# Patient Record
Sex: Female | Born: 1950 | Race: White | Hispanic: No | Marital: Single | Smoking: Former smoker
Health system: Southern US, Community
[De-identification: ages and names within clinical notes are randomized; demographics above are authoritative.]

## PROBLEM LIST (undated history)

## (undated) DIAGNOSIS — B182 Chronic viral hepatitis C: Secondary | ICD-10-CM

## (undated) DIAGNOSIS — I10 Essential (primary) hypertension: Secondary | ICD-10-CM

## (undated) DIAGNOSIS — Z9889 Other specified postprocedural states: Secondary | ICD-10-CM

## (undated) DIAGNOSIS — J15212 Pneumonia due to Methicillin resistant Staphylococcus aureus: Secondary | ICD-10-CM

## (undated) DIAGNOSIS — J95851 Ventilator associated pneumonia: Secondary | ICD-10-CM

## (undated) DIAGNOSIS — M1990 Unspecified osteoarthritis, unspecified site: Secondary | ICD-10-CM

## (undated) DIAGNOSIS — G2581 Restless legs syndrome: Secondary | ICD-10-CM

## (undated) DIAGNOSIS — M810 Age-related osteoporosis without current pathological fracture: Secondary | ICD-10-CM

## (undated) DIAGNOSIS — I4891 Unspecified atrial fibrillation: Secondary | ICD-10-CM

## (undated) DIAGNOSIS — R0600 Dyspnea, unspecified: Secondary | ICD-10-CM

## (undated) DIAGNOSIS — A0472 Enterocolitis due to Clostridium difficile, not specified as recurrent: Secondary | ICD-10-CM

## (undated) DIAGNOSIS — J449 Chronic obstructive pulmonary disease, unspecified: Secondary | ICD-10-CM

## (undated) DIAGNOSIS — F419 Anxiety disorder, unspecified: Secondary | ICD-10-CM

## (undated) DIAGNOSIS — G629 Polyneuropathy, unspecified: Secondary | ICD-10-CM

## (undated) DIAGNOSIS — I499 Cardiac arrhythmia, unspecified: Secondary | ICD-10-CM

## (undated) DIAGNOSIS — F329 Major depressive disorder, single episode, unspecified: Secondary | ICD-10-CM

## (undated) DIAGNOSIS — M797 Fibromyalgia: Secondary | ICD-10-CM

## (undated) DIAGNOSIS — Z9981 Dependence on supplemental oxygen: Secondary | ICD-10-CM

## (undated) DIAGNOSIS — Z931 Gastrostomy status: Secondary | ICD-10-CM

## (undated) DIAGNOSIS — F32A Depression, unspecified: Secondary | ICD-10-CM

## (undated) DIAGNOSIS — D649 Anemia, unspecified: Secondary | ICD-10-CM

## (undated) HISTORY — PX: BREAST SURGERY: SHX581

## (undated) HISTORY — PX: ROTATOR CUFF REPAIR: SHX139

## (undated) HISTORY — PX: DILATION AND CURETTAGE OF UTERUS: SHX78

## (undated) HISTORY — PX: ABDOMINAL HYSTERECTOMY: SHX81

---

## 2015-06-25 DIAGNOSIS — J15212 Pneumonia due to Methicillin resistant Staphylococcus aureus: Secondary | ICD-10-CM

## 2015-06-25 HISTORY — DX: Pneumonia due to methicillin resistant Staphylococcus aureus: J15.212

## 2015-09-23 DIAGNOSIS — A0472 Enterocolitis due to Clostridium difficile, not specified as recurrent: Secondary | ICD-10-CM

## 2015-09-23 DIAGNOSIS — Z931 Gastrostomy status: Secondary | ICD-10-CM

## 2015-09-23 HISTORY — DX: Enterocolitis due to Clostridium difficile, not specified as recurrent: A04.72

## 2015-09-23 HISTORY — DX: Gastrostomy status: Z93.1

## 2015-10-23 DIAGNOSIS — J95851 Ventilator associated pneumonia: Secondary | ICD-10-CM

## 2015-10-23 HISTORY — DX: Ventilator associated pneumonia: J95.851

## 2015-11-29 DIAGNOSIS — J440 Chronic obstructive pulmonary disease with acute lower respiratory infection: Secondary | ICD-10-CM | POA: Insufficient documentation

## 2015-12-06 DIAGNOSIS — J9611 Chronic respiratory failure with hypoxia: Secondary | ICD-10-CM | POA: Insufficient documentation

## 2015-12-06 DIAGNOSIS — F32A Depression, unspecified: Secondary | ICD-10-CM | POA: Insufficient documentation

## 2015-12-06 DIAGNOSIS — Z9889 Other specified postprocedural states: Secondary | ICD-10-CM | POA: Insufficient documentation

## 2015-12-06 DIAGNOSIS — G2581 Restless legs syndrome: Secondary | ICD-10-CM | POA: Insufficient documentation

## 2015-12-06 DIAGNOSIS — F1911 Other psychoactive substance abuse, in remission: Secondary | ICD-10-CM | POA: Insufficient documentation

## 2015-12-06 DIAGNOSIS — Z931 Gastrostomy status: Secondary | ICD-10-CM | POA: Insufficient documentation

## 2015-12-06 DIAGNOSIS — M797 Fibromyalgia: Secondary | ICD-10-CM | POA: Insufficient documentation

## 2015-12-06 DIAGNOSIS — I10 Essential (primary) hypertension: Secondary | ICD-10-CM | POA: Insufficient documentation

## 2015-12-06 DIAGNOSIS — M816 Localized osteoporosis [Lequesne]: Secondary | ICD-10-CM | POA: Insufficient documentation

## 2015-12-06 DIAGNOSIS — B182 Chronic viral hepatitis C: Secondary | ICD-10-CM | POA: Insufficient documentation

## 2015-12-06 DIAGNOSIS — G609 Hereditary and idiopathic neuropathy, unspecified: Secondary | ICD-10-CM | POA: Insufficient documentation

## 2016-01-31 ENCOUNTER — Other Ambulatory Visit: Payer: Self-pay | Admitting: Student

## 2016-01-31 DIAGNOSIS — G8929 Other chronic pain: Secondary | ICD-10-CM

## 2016-01-31 DIAGNOSIS — M7582 Other shoulder lesions, left shoulder: Secondary | ICD-10-CM

## 2016-01-31 DIAGNOSIS — M25512 Pain in left shoulder: Principal | ICD-10-CM

## 2016-02-15 ENCOUNTER — Ambulatory Visit: Admission: RE | Admit: 2016-02-15 | Payer: Medicare Other | Source: Ambulatory Visit

## 2016-02-27 ENCOUNTER — Ambulatory Visit
Admission: RE | Admit: 2016-02-27 | Discharge: 2016-02-27 | Disposition: A | Discharge status: Home / Self Care | Payer: Medicare Other | Source: Ambulatory Visit | Attending: Student | Admitting: Student

## 2016-02-27 DIAGNOSIS — M7552 Bursitis of left shoulder: Secondary | ICD-10-CM | POA: Diagnosis not present

## 2016-02-27 DIAGNOSIS — M12812 Other specific arthropathies, not elsewhere classified, left shoulder: Secondary | ICD-10-CM | POA: Diagnosis not present

## 2016-02-27 DIAGNOSIS — M7582 Other shoulder lesions, left shoulder: Secondary | ICD-10-CM | POA: Insufficient documentation

## 2016-02-27 DIAGNOSIS — M25412 Effusion, left shoulder: Secondary | ICD-10-CM | POA: Diagnosis not present

## 2016-02-27 DIAGNOSIS — M62512 Muscle wasting and atrophy, not elsewhere classified, left shoulder: Secondary | ICD-10-CM | POA: Diagnosis not present

## 2016-02-27 DIAGNOSIS — M25512 Pain in left shoulder: Secondary | ICD-10-CM | POA: Diagnosis present

## 2016-02-27 DIAGNOSIS — G8929 Other chronic pain: Secondary | ICD-10-CM | POA: Diagnosis not present

## 2016-11-09 ENCOUNTER — Inpatient Hospital Stay
Admission: EM | Admit: 2016-11-09 | Discharge: 2016-11-10 | DRG: 309 | Disposition: A | Discharge status: Home / Self Care | Payer: Medicare Other | Attending: Internal Medicine | Admitting: Internal Medicine

## 2016-11-09 ENCOUNTER — Emergency Department: Payer: Medicare Other

## 2016-11-09 DIAGNOSIS — F329 Major depressive disorder, single episode, unspecified: Secondary | ICD-10-CM | POA: Diagnosis present

## 2016-11-09 DIAGNOSIS — I4891 Unspecified atrial fibrillation: Secondary | ICD-10-CM | POA: Diagnosis present

## 2016-11-09 DIAGNOSIS — Z931 Gastrostomy status: Secondary | ICD-10-CM | POA: Diagnosis not present

## 2016-11-09 DIAGNOSIS — M810 Age-related osteoporosis without current pathological fracture: Secondary | ICD-10-CM | POA: Diagnosis present

## 2016-11-09 DIAGNOSIS — G629 Polyneuropathy, unspecified: Secondary | ICD-10-CM | POA: Diagnosis present

## 2016-11-09 DIAGNOSIS — B182 Chronic viral hepatitis C: Secondary | ICD-10-CM | POA: Diagnosis present

## 2016-11-09 DIAGNOSIS — Z9981 Dependence on supplemental oxygen: Secondary | ICD-10-CM

## 2016-11-09 DIAGNOSIS — F419 Anxiety disorder, unspecified: Secondary | ICD-10-CM | POA: Diagnosis present

## 2016-11-09 DIAGNOSIS — G2581 Restless legs syndrome: Secondary | ICD-10-CM | POA: Diagnosis present

## 2016-11-09 DIAGNOSIS — Z791 Long term (current) use of non-steroidal anti-inflammatories (NSAID): Secondary | ICD-10-CM

## 2016-11-09 DIAGNOSIS — J9610 Chronic respiratory failure, unspecified whether with hypoxia or hypercapnia: Secondary | ICD-10-CM | POA: Diagnosis present

## 2016-11-09 DIAGNOSIS — E871 Hypo-osmolality and hyponatremia: Secondary | ICD-10-CM | POA: Diagnosis present

## 2016-11-09 DIAGNOSIS — I10 Essential (primary) hypertension: Secondary | ICD-10-CM | POA: Diagnosis present

## 2016-11-09 DIAGNOSIS — M797 Fibromyalgia: Secondary | ICD-10-CM | POA: Diagnosis present

## 2016-11-09 DIAGNOSIS — I471 Supraventricular tachycardia: Secondary | ICD-10-CM | POA: Diagnosis present

## 2016-11-09 DIAGNOSIS — J449 Chronic obstructive pulmonary disease, unspecified: Secondary | ICD-10-CM | POA: Diagnosis present

## 2016-11-09 DIAGNOSIS — Z7951 Long term (current) use of inhaled steroids: Secondary | ICD-10-CM

## 2016-11-09 DIAGNOSIS — Z7982 Long term (current) use of aspirin: Secondary | ICD-10-CM | POA: Diagnosis not present

## 2016-11-09 DIAGNOSIS — I272 Pulmonary hypertension, unspecified: Secondary | ICD-10-CM | POA: Diagnosis present

## 2016-11-09 DIAGNOSIS — I480 Paroxysmal atrial fibrillation: Secondary | ICD-10-CM | POA: Diagnosis present

## 2016-11-09 DIAGNOSIS — Z79899 Other long term (current) drug therapy: Secondary | ICD-10-CM

## 2016-11-09 DIAGNOSIS — Z87891 Personal history of nicotine dependence: Secondary | ICD-10-CM

## 2016-11-09 HISTORY — DX: Chronic obstructive pulmonary disease, unspecified: J44.9

## 2016-11-09 HISTORY — DX: Essential (primary) hypertension: I10

## 2016-11-09 HISTORY — DX: Age-related osteoporosis without current pathological fracture: M81.0

## 2016-11-09 HISTORY — DX: Fibromyalgia: M79.7

## 2016-11-09 HISTORY — DX: Major depressive disorder, single episode, unspecified: F32.9

## 2016-11-09 HISTORY — DX: Restless legs syndrome: G25.81

## 2016-11-09 HISTORY — DX: Enterocolitis due to Clostridium difficile, not specified as recurrent: A04.72

## 2016-11-09 HISTORY — DX: Pneumonia due to methicillin resistant Staphylococcus aureus: J15.212

## 2016-11-09 HISTORY — DX: Gastrostomy status: Z93.1

## 2016-11-09 HISTORY — DX: Polyneuropathy, unspecified: G62.9

## 2016-11-09 HISTORY — DX: Chronic viral hepatitis C: B18.2

## 2016-11-09 HISTORY — DX: Other specified postprocedural states: Z98.890

## 2016-11-09 HISTORY — DX: Depression, unspecified: F32.A

## 2016-11-09 LAB — PHOSPHORUS: PHOSPHORUS: 3.8 mg/dL (ref 2.5–4.6)

## 2016-11-09 LAB — URINALYSIS, COMPLETE (UACMP) WITH MICROSCOPIC
Bilirubin Urine: NEGATIVE
GLUCOSE, UA: NEGATIVE mg/dL
HGB URINE DIPSTICK: NEGATIVE
Ketones, ur: NEGATIVE mg/dL
Leukocytes, UA: NEGATIVE
Nitrite: NEGATIVE
PROTEIN: NEGATIVE mg/dL
SPECIFIC GRAVITY, URINE: 1.003 — AB (ref 1.005–1.030)
pH: 7 (ref 5.0–8.0)

## 2016-11-09 LAB — TSH
TSH: 2.032 u[IU]/mL (ref 0.350–4.500)
TSH: 1.34 u[IU]/mL (ref 0.350–4.500)

## 2016-11-09 LAB — COMPREHENSIVE METABOLIC PANEL
ALT: 19 U/L (ref 14–54)
ANION GAP: 9 (ref 5–15)
AST: 27 U/L (ref 15–41)
Albumin: 4.1 g/dL (ref 3.5–5.0)
Alkaline Phosphatase: 18 U/L — ABNORMAL LOW (ref 38–126)
BILIRUBIN TOTAL: 0.5 mg/dL (ref 0.3–1.2)
BUN: 17 mg/dL (ref 6–20)
CO2: 28 mmol/L (ref 22–32)
Calcium: 9.2 mg/dL (ref 8.9–10.3)
Chloride: 90 mmol/L — ABNORMAL LOW (ref 101–111)
Creatinine, Ser: 0.86 mg/dL (ref 0.44–1.00)
GFR calc Af Amer: >60 mL/min (ref >60)
Glucose, Bld: 101 mg/dL — ABNORMAL HIGH (ref 65–99)
POTASSIUM: 3.8 mmol/L (ref 3.5–5.1)
Sodium: 127 mmol/L — ABNORMAL LOW (ref 135–145)
TOTAL PROTEIN: 7.2 g/dL (ref 6.5–8.1)

## 2016-11-09 LAB — TROPONIN I: Troponin I: <0.03 ng/mL (ref <0.03)

## 2016-11-09 LAB — CBC
HEMATOCRIT: 34.6 % — AB (ref 35.0–47.0)
HEMOGLOBIN: 11.7 g/dL — AB (ref 12.0–16.0)
MCH: 31.4 pg (ref 26.0–34.0)
MCHC: 33.7 g/dL (ref 32.0–36.0)
MCV: 93 fL (ref 80.0–100.0)
Platelets: 324 10*3/uL (ref 150–440)
RBC: 3.72 MIL/uL — ABNORMAL LOW (ref 3.80–5.20)
RDW: 13.6 % (ref 11.5–14.5)
WBC: 8 10*3/uL (ref 3.6–11.0)

## 2016-11-09 LAB — T4, FREE: Free T4: 0.75 ng/dL (ref 0.61–1.12)

## 2016-11-09 LAB — MAGNESIUM: MAGNESIUM: 1.9 mg/dL (ref 1.7–2.4)

## 2016-11-09 MED ORDER — UMECLIDINIUM BROMIDE 62.5 MCG/INH IN AEPB
1.0000 | INHALATION_SPRAY | Freq: Every day | RESPIRATORY_TRACT | Status: DC
  Administered 2016-11-10: 10:00:00 1 via RESPIRATORY_TRACT
  Filled 2016-11-09: qty 7

## 2016-11-09 MED ORDER — ESTRADIOL 1 MG PO TABS
1.0000 mg | ORAL_TABLET | Freq: Every day | ORAL | Status: DC
  Administered 2016-11-10: 1 mg via ORAL
  Filled 2016-11-09: qty 1

## 2016-11-09 MED ORDER — DILTIAZEM HCL 100 MG IV SOLR
5.0000 mg/h | INTRAVENOUS | Status: DC
  Administered 2016-11-09: 5 mg/h via INTRAVENOUS
  Filled 2016-11-09 (×2): qty 100

## 2016-11-09 MED ORDER — ONDANSETRON HCL 4 MG PO TABS: 4.0000 mg | ORAL_TABLET | Freq: Four times a day (QID) | ORAL | Status: DC | PRN

## 2016-11-09 MED ORDER — ACETAMINOPHEN 325 MG PO TABS: 650.0000 mg | ORAL_TABLET | Freq: Four times a day (QID) | ORAL | Status: DC | PRN

## 2016-11-09 MED ORDER — MAGNESIUM OXIDE 400 (241.3 MG) MG PO TABS
400.0000 mg | ORAL_TABLET | Freq: Every day | ORAL | Status: DC
  Administered 2016-11-10: 400 mg via ORAL
  Filled 2016-11-09: qty 1

## 2016-11-09 MED ORDER — FUROSEMIDE 20 MG PO TABS
20.0000 mg | ORAL_TABLET | Freq: Every day | ORAL | Status: DC
  Administered 2016-11-10: 20 mg via ORAL
  Filled 2016-11-09: qty 1

## 2016-11-09 MED ORDER — SODIUM CHLORIDE 0.9 % IV BOLUS (SEPSIS)
1000.0000 mL | Freq: Once | INTRAVENOUS | Status: AC
  Administered 2016-11-09: 1000 mL via INTRAVENOUS

## 2016-11-09 MED ORDER — LISINOPRIL 10 MG PO TABS
10.0000 mg | ORAL_TABLET | Freq: Every day | ORAL | Status: DC
  Administered 2016-11-10: 10 mg via ORAL
  Filled 2016-11-09: qty 1

## 2016-11-09 MED ORDER — FLUTICASONE FUROATE-VILANTEROL 100-25 MCG/INH IN AEPB
1.0000 | INHALATION_SPRAY | Freq: Every day | RESPIRATORY_TRACT | Status: DC
  Administered 2016-11-10: 1 via RESPIRATORY_TRACT
  Filled 2016-11-09: qty 28

## 2016-11-09 MED ORDER — ASPIRIN 325 MG PO TABS
325.0000 mg | ORAL_TABLET | Freq: Every day | ORAL | Status: DC
  Administered 2016-11-10: 325 mg via ORAL
  Filled 2016-11-09: qty 1

## 2016-11-09 MED ORDER — ENOXAPARIN SODIUM 60 MG/0.6ML ~~LOC~~ SOLN
1.0000 mg/kg | SUBCUTANEOUS | Status: AC
  Administered 2016-11-09: 50 mg via SUBCUTANEOUS
  Filled 2016-11-09: qty 0.6

## 2016-11-09 MED ORDER — MELOXICAM 7.5 MG PO TABS
7.5000 mg | ORAL_TABLET | Freq: Every day | ORAL | Status: DC
  Administered 2016-11-10: 7.5 mg via ORAL
  Filled 2016-11-09: qty 1

## 2016-11-09 MED ORDER — ONDANSETRON HCL 4 MG/2ML IJ SOLN: 4.0000 mg | Freq: Four times a day (QID) | INTRAMUSCULAR | Status: DC | PRN

## 2016-11-09 MED ORDER — ENOXAPARIN SODIUM 60 MG/0.6ML ~~LOC~~ SOLN
1.0000 mg/kg | Freq: Two times a day (BID) | SUBCUTANEOUS | Status: DC
  Administered 2016-11-10: 50 mg via SUBCUTANEOUS
  Filled 2016-11-09: qty 0.6

## 2016-11-09 MED ORDER — DILTIAZEM LOAD VIA INFUSION
20.0000 mg | Freq: Once | INTRAVENOUS | Status: AC
  Administered 2016-11-09: 20 mg via INTRAVENOUS
  Filled 2016-11-09: qty 20

## 2016-11-09 MED ORDER — ADENOSINE 12 MG/4ML IV SOLN
INTRAVENOUS | Status: AC
  Filled 2016-11-09: qty 4

## 2016-11-09 MED ORDER — ACETAMINOPHEN 650 MG RE SUPP: 650.0000 mg | Freq: Four times a day (QID) | RECTAL | Status: DC | PRN

## 2016-11-09 MED ORDER — MAGNESIUM SULFATE 2 GM/50ML IV SOLN
2.0000 g | Freq: Once | INTRAVENOUS | Status: AC
  Administered 2016-11-09: 2 g via INTRAVENOUS
  Filled 2016-11-09: qty 50

## 2016-11-09 MED ORDER — ADENOSINE 6 MG/2ML IV SOLN
INTRAVENOUS | Status: DC | PRN
  Administered 2016-11-09: 12 mg via INTRAVENOUS

## 2016-11-09 MED ORDER — QUETIAPINE FUMARATE 25 MG PO TABS
25.0000 mg | ORAL_TABLET | Freq: Every day | ORAL | Status: DC
Start: 2016-11-09 — End: 2016-11-10
  Administered 2016-11-09: 25 mg via ORAL
  Filled 2016-11-09: qty 1

## 2016-11-09 MED ORDER — HYDROCODONE-ACETAMINOPHEN 10-325 MG PO TABS
1.0000 | ORAL_TABLET | Freq: Four times a day (QID) | ORAL | Status: DC | PRN
  Administered 2016-11-10: 1 via ORAL
  Filled 2016-11-09: qty 1

## 2016-11-09 MED ORDER — DIAZEPAM 5 MG PO TABS
5.0000 mg | ORAL_TABLET | Freq: Three times a day (TID) | ORAL | Status: DC | PRN
  Administered 2016-11-09: 5 mg via ORAL
  Filled 2016-11-09: qty 1

## 2016-11-09 MED ORDER — ALBUTEROL SULFATE (2.5 MG/3ML) 0.083% IN NEBU: 3.0000 mL | INHALATION_SOLUTION | Freq: Four times a day (QID) | RESPIRATORY_TRACT | Status: DC | PRN

## 2016-11-09 MED ORDER — METOPROLOL TARTRATE 25 MG PO TABS
25.0000 mg | ORAL_TABLET | Freq: Two times a day (BID) | ORAL | Status: DC
  Administered 2016-11-09 – 2016-11-10 (×2): 25 mg via ORAL
  Filled 2016-11-09 (×3): qty 1

## 2016-11-09 NOTE — H&P (Signed)
[facility] -  at [facility]   PATIENT NAME: Virginia CraftSandra [name]    MR#:  [id number]  DATE OF BIRTH:  03/18/1951  DATE OF ADMISSION:  11/09/2016  PRIMARY CARE PHYSICIAN: Marguarite ArbourSparks, Jeffrey D, MD   REQUESTING/REFERRING PHYSICIAN: Sharman CheekStafford Phillip MD  CHIEF COMPLAINT:   Chief Complaint  Patient presents with  . Chest Pain  . Shortness of Breath    HISTORY OF PRESENT ILLNESS: Virginia CraftSandra [name]  is a 66 y.o. female with a known history of  Chronic respiratory failure, copd , depression, fibromyalgia, hepatitis C , essential hypertension, osteoporosis, peripheral neuropathy, restless leg syndrome who presents to the ED with complaint of having chest pain and palpitations. Patient reports that she's been having these symptoms ongoing for the past 1 week. Her palpitations lasted only briefly for the past week however today they continue to persist throughout the day. Patient was noted to be in atrial fibrillation by EMS and had episode of SVT for which she received 6 mg of adenosine. Heart rate continued to be elevated therefore had to be started on a Cardizem drip. Her heart rate is currently controlled. She is on chronic oxygen breathing seems to be at baseline.   PAST MEDICAL HISTORY:   Past Medical History:  Diagnosis Date  . C. difficile colitis   . COPD (chronic obstructive pulmonary disease) (HCC)   . Depression   . Fibromyalgia   . H/O tracheostomy   . Hep C w/ coma, chronic (HCC)   . Hypertension   . MRSA pneumonia (HCC)   . Osteoporosis   . Peripheral neuropathy   . RLS (restless legs syndrome)   . S/P percutaneous endoscopic gastrostomy (PEG) tube placement (HCC)     PAST SURGICAL HISTORY: Past Surgical History:  Procedure Laterality Date  . ABDOMINAL HYSTERECTOMY    . ROTATOR CUFF REPAIR      SOCIAL HISTORY:  Social History  Substance Use Topics  . Smoking status: Former Games developermoker  . Smokeless tobacco: Never Used  . Alcohol use No    FAMILY HISTORY:   Family History  Problem Relation Age of Onset  . Hypertension Mother     DRUG ALLERGIES: Not on File  REVIEW OF SYSTEMS:   CONSTITUTIONAL: No fever, fatigue or weakness.  EYES: No blurred or double vision.  EARS, NOSE, AND THROAT: No tinnitus or ear pain.  RESPIRATORY: No cough, shortness of breath, wheezing or hemoptysis.  CARDIOVASCULAR: No chest pain, orthopnea, edema. Positive palpitations GASTROINTESTINAL: No nausea, vomiting, diarrhea or abdominal pain.  GENITOURINARY: No dysuria, hematuria.  ENDOCRINE: No polyuria, nocturia,  HEMATOLOGY: No anemia, easy bruising or bleeding SKIN: No rash or lesion. MUSCULOSKELETAL: No joint pain or arthritis.   NEUROLOGIC: No tingling, numbness, weakness.  PSYCHIATRY: No anxiety or depression.   MEDICATIONS AT HOME:  Prior to Admission medications   Medication Sig Start Date End Date Taking? Authorizing Provider  aspirin 325 MG tablet Take 325 mg by mouth every 4 (four) hours as needed.   Yes [provider]  diazepam (VALIUM) 5 MG tablet Take 5 mg by mouth every 8 (eight) hours as needed. 10/22/16  Yes [provider]  estradiol (ESTRACE) 1 MG tablet Take 1 mg by mouth daily. 09/10/16  Yes [provider]  furosemide (LASIX) 20 MG tablet Take 20 mg by mouth daily.  10/01/16  Yes [provider]  HYDROcodone-acetaminophen (NORCO) 10-325 MG tablet Take 1 tablet by mouth every 6 (six) hours as needed. 10/23/16  Yes [provider]  INCRUSE ELLIPTA 62.5 MCG/INH AEPB Inhale 1 puff into the lungs daily. 10/24/16  Yes [provider]  lisinopril (PRINIVIL,ZESTRIL) 10 MG tablet Take 10 mg by mouth daily. 10/23/16  Yes [provider]  MAGNESIUM-OXIDE 400 (241.3 Mg) MG tablet Take 1 tablet by mouth daily. 08/24/16  Yes [provider]  meloxicam (MOBIC) 7.5 MG tablet Take 7.5 mg by mouth daily. 10/01/16  Yes [provider]  metoprolol tartrate (LOPRESSOR) 25 MG tablet Take 25 mg  by mouth 2 (two) times daily. 09/20/16  Yes [provider]  Multiple Vitamin (MULTIVITAMIN) tablet Take 1 tablet by mouth daily.   Yes [provider]  Multiple Vitamins-Minerals (ZINC PO) Take 1 tablet by mouth daily.   Yes [provider]  PROAIR HFA 108 (90 Base) MCG/ACT inhaler Inhale 2 puffs into the lungs every 6 (six) hours as needed. 10/23/16  Yes [provider]  QUEtiapine (SEROQUEL) 25 MG tablet Take 25 mg by mouth daily. 10/01/16  Yes [provider]  SYMBICORT 80-4.5 MCG/ACT inhaler Inhale 2 puffs into the lungs 2 (two) times daily. 10/24/16  Yes [provider]      PHYSICAL EXAMINATION:   VITAL SIGNS: Blood pressure (!) 131/102, pulse 99, resp. rate 17, weight 107 lb (48.5 kg), SpO2 99 %.  GENERAL:  66 y.o.-year-old patient lying in the bed with no acute distress.  EYES: Pupils equal, round, reactive to light and accommodation. No scleral icterus. Extraocular muscles intact.  HEENT: Head atraumatic, normocephalic. Oropharynx and nasopharynx clear.  NECK:  Supple, no jugular venous distention. No thyroid enlargement, no tenderness.  LUNGS: Normal breath sounds bilaterally, no wheezing, rales,rhonchi or crepitation. No use of accessory muscles of respiration.  CARDIOVASCULAR: Irregularly irregular. No murmurs, rubs, or gallops.  ABDOMEN: Soft, nontender, nondistended. Bowel sounds present. No organomegaly or mass.  EXTREMITIES: No pedal edema, cyanosis, or clubbing.  NEUROLOGIC: Cranial nerves II through XII are intact. Muscle strength 5/5 in all extremities. Sensation intact. Gait not checked.  PSYCHIATRIC: The patient is alert and oriented x 3.  SKIN: No obvious rash, lesion, or ulcer.   LABORATORY PANEL:   CBC  Recent Labs Lab 11/09/16 1854  WBC 8.0  HGB 11.7*  HCT 34.6*  PLT 324  MCV 93.0  MCH 31.4  MCHC 33.7  RDW 13.6    ------------------------------------------------------------------------------------------------------------------  Chemistries   Recent Labs Lab 11/09/16 1854  NA 127*  K 3.8  CL 90*  CO2 28  GLUCOSE 101*  BUN 17  CREATININE 0.86  CALCIUM 9.2  MG 1.9  AST 27  ALT 19  ALKPHOS 18*  BILITOT 0.5   ------------------------------------------------------------------------------------------------------------------ CrCl cannot be calculated (Unknown ideal weight.). ------------------------------------------------------------------------------------------------------------------  Recent Labs  11/09/16 1854  TSH 2.032     Coagulation profile No results for input(s): INR, PROTIME in the last 168 hours. ------------------------------------------------------------------------------------------------------------------- No results for input(s): DDIMER in the last 72 hours. -------------------------------------------------------------------------------------------------------------------  Cardiac Enzymes  Recent Labs Lab 11/09/16 1854  TROPONINI <0.03   ------------------------------------------------------------------------------------------------------------------ Invalid input(s): POCBNP  ---------------------------------------------------------------------------------------------------------------  Urinalysis    Component Value Date/Time   COLORURINE STRAW (A) 11/09/2016 1855   APPEARANCEUR CLEAR (A) 11/09/2016 1855   LABSPEC 1.003 (L) 11/09/2016 1855   PHURINE 7.0 11/09/2016 1855   GLUCOSEU NEGATIVE 11/09/2016 1855   HGBUR NEGATIVE 11/09/2016 1855   BILIRUBINUR NEGATIVE 11/09/2016 1855   KETONESUR NEGATIVE 11/09/2016 1855   PROTEINUR NEGATIVE 11/09/2016 1855   NITRITE NEGATIVE 11/09/2016 1855   LEUKOCYTESUR NEGATIVE 11/09/2016 1855     RADIOLOGY:  Dg Chest Portable 1 View  Result Date: 11/09/2016 CLINICAL DATA:  Chest pain, dizziness beginning this  evening. History of atrial fibrillation. EXAM: PORTABLE CHEST 1 VIEW COMPARISON:  None. FINDINGS: Lungs are adequately inflated without focal consolidation or effusion. Cardiomediastinal silhouette is within normal. There is mild calcified plaque over the thoracic aorta. Mild degenerate change of the spine. IMPRESSION: No acute cardiopulmonary disease. Aortic atherosclerosis. Electronically Signed   By: Elberta Fortis M.D.   On: 11/09/2016 19:17    EKG: Orders placed or performed during the hospital encounter of 11/09/16  . ED EKG  . ED EKG    IMPRESSION AND PLAN: Patient is a 66 year old with history of COPD and chronic respiratory with palpations   1. Atrial fibrillation with rapid ventricular rate continue Cardizem drip I will give patient Lovenox full dose Will ask cardiology to see Continue Cardizem drip  2. Chronic respiratory failure continue oxygen therapy Continue home inhalers  3. Essential hypertension continue therapy with lisinopril and metoprolol  4. Anxiety disorder continue Valium and Seroquel  5. Miscellaneous patient will be on full dose Lovenox    All the records are reviewed and case discussed with ED provider. Management plans discussed with the patient, family and they are in agreement.  CODE STATUS: Code Status History    This patient does not have a recorded code status. Please follow your organizational policy for patients in this situation.       TOTAL TIME TAKING CARE OF THIS PATIENT: 55 minutes.    Auburn Bilberry M.D on 11/09/2016 at 8:41 PM  Between 7am to 6pm - Pager - [phone]  After 6pm go to [email or web address] - password [email or web address] [email or web address]  [facility]  Office  [phone]  CC: Primary care physician; Marguarite Arbour, MD

## 2016-11-09 NOTE — ED Provider Notes (Signed)
[facility] Emergency Department Provider Note  ____________________________________________  Time seen: Approximately 8:05 PM  I have reviewed the triage vital signs and the nursing notes.   HISTORY  Chief Complaint Chest Pain and Shortness of Breath    HPI Virginia [name] is a 66 y.o. female who complains of intermittent chest pain for the past week with strong palpitations. She also feels short of breath with this and also reports getting lightheaded and feeling like she is Passed out. This seems to be getting worse and tonight she felt so bad that she called EMS. They found her to be in A. fib but then had an episode of SVT for which they gave 6 mg of adenosine. Patient has not noted any aggravating or alleviating factors. Never had anything like this before. Pain is moderate in intensity and sharp.     History reviewed. No pertinent past medical history. COPD on 3 L nasal cannula at home  There are no active problems to display for this patient.    History reviewed. No pertinent surgical history.   Prior to Admission medications   Medication Sig Start Date End Date Taking? Authorizing Provider  aspirin 325 MG tablet Take 325 mg by mouth every 4 (four) hours as needed.   Yes [provider]  diazepam (VALIUM) 5 MG tablet Take 5 mg by mouth every 8 (eight) hours as needed. 10/22/16  Yes [provider]  estradiol (ESTRACE) 1 MG tablet Take 1 mg by mouth daily. 09/10/16  Yes [provider]  furosemide (LASIX) 20 MG tablet Take 20 mg by mouth daily.  10/01/16  Yes [provider]  HYDROcodone-acetaminophen (NORCO) 10-325 MG tablet Take 1 tablet by mouth every 6 (six) hours as needed. 10/23/16  Yes [provider]  INCRUSE ELLIPTA 62.5 MCG/INH AEPB Inhale 1 puff into the lungs daily. 10/24/16  Yes [provider]  lisinopril (PRINIVIL,ZESTRIL) 10 MG tablet Take 10 mg by mouth daily. 10/23/16  Yes [provider]  MAGNESIUM-OXIDE 400 (241.3 Mg) MG tablet Take 1 tablet by mouth daily. 08/24/16  Yes [provider]  meloxicam (MOBIC) 7.5 MG tablet Take 7.5 mg by mouth daily. 10/01/16  Yes [provider]  metoprolol tartrate (LOPRESSOR) 25 MG tablet Take 25 mg by mouth 2 (two) times daily. 09/20/16  Yes [provider]  Multiple Vitamin (MULTIVITAMIN) tablet Take 1 tablet by mouth daily.   Yes [provider]  Multiple Vitamins-Minerals (ZINC PO) Take 1 tablet by mouth daily.   Yes [provider]  PROAIR HFA 108 (90 Base) MCG/ACT inhaler Inhale 2 puffs into the lungs every 6 (six) hours as needed. 10/23/16  Yes [provider]  QUEtiapine (SEROQUEL) 25 MG tablet Take 25 mg by mouth daily. 10/01/16  Yes [provider]  SYMBICORT 80-4.5 MCG/ACT inhaler Inhale 2 puffs into the lungs 2 (two) times daily. 10/24/16  Yes [provider]     Allergies Patient has no allergy information on record.   History reviewed. No pertinent family history.  Social History Social History  Substance Use Topics  . Smoking status: Not on file  . Smokeless tobacco: Not on file  . Alcohol use Not on file  Denies tobacco alcohol or drug use  Review of Systems  Constitutional:   No fever or chills.  ENT:   No sore throat. No rhinorrhea. Cardiovascular:   Positive chest pain with near syncope on standing. Respiratory:  Positive shortness of breath. No cough. Gastrointestinal:  Negative for abdominal pain, vomiting and diarrhea.  Musculoskeletal:   Negative for focal pain or swelling All other systems reviewed and are negative except as documented above in ROS and HPI.  ____________________________________________   PHYSICAL EXAM:  VITAL SIGNS: ED Triage Vitals [11/09/16 1853]  Enc Vitals Group     BP      Pulse      Resp      Temp      Temp src      SpO2      Weight 107 lb (48.5 kg)     Height      Head Circumference       Peak Flow      Pain Score      Pain Loc      Pain Edu?      Excl. in GC?     Vital signs reviewed, nursing assessments reviewed.   Constitutional:   Alert and oriented. Mild distress due to symptoms. Eyes:   No scleral icterus. No conjunctival pallor. PERRL. EOMI.  No nystagmus. ENT   Head:   Normocephalic and atraumatic.   Nose:   No congestion/rhinnorhea.    Mouth/Throat:   MMM, no pharyngeal erythema. No peritonsillar mass.    Neck:   No meningismus. Full ROM Hematological/Lymphatic/Immunilogical:   No cervical lymphadenopathy. Cardiovascular:   Irregularly irregular rhythm, rate of 120. Frequent ectopy, intermittent episodes of SVT. Symmetric bilateral radial and DP pulses.  No murmurs.  Respiratory:   Normal respiratory effort without tachypnea/retractions. Breath sounds are clear and equal bilaterally. No wheezes/rales/rhonchi. Gastrointestinal:   Soft and nontender. Non distended. There is no CVA tenderness.  No rebound, rigidity, or guarding. Genitourinary:   deferred Musculoskeletal:   Normal range of motion in all extremities. No joint effusions.  No lower extremity tenderness.  No edema. Neurologic:   Normal speech and language.  Motor grossly intact. No gross focal neurologic deficits are appreciated.  Skin:    Skin is warm, dry and intact. No rash noted.  No petechiae, purpura, or bullae.  ____________________________________________    LABS (pertinent positives/negatives) (all labs ordered are listed, but only abnormal results are displayed) Labs Reviewed  COMPREHENSIVE METABOLIC PANEL - Abnormal; Notable for the following:       Result Value   Sodium 127 (*)    Chloride 90 (*)    Glucose, Bld 101 (*)    Alkaline Phosphatase 18 (*)    All other components within normal limits  CBC - Abnormal; Notable for the following:    RBC 3.72 (*)    Hemoglobin 11.7 (*)    HCT 34.6 (*)    All other components within normal limits  URINALYSIS, COMPLETE  (UACMP) WITH MICROSCOPIC - Abnormal; Notable for the following:    Color, Urine STRAW (*)    APPearance CLEAR (*)    Specific Gravity, Urine 1.003 (*)    Bacteria, UA RARE (*)    Squamous Epithelial / LPF 0-5 (*)    All other components within normal limits  URINE CULTURE  TROPONIN I  MAGNESIUM  PHOSPHORUS  T4, FREE  TSH   ____________________________________________   EKG  Interpreted by me SVT rate of 197, normal axis and intervals. Poor R-wave progression in anterior precordial leads. Slight lateral ST depression consistent with demand ischemia. Normal T waves  Repeat EKG showed alternation between atrial fibrillation, sinus tachycardia, and SVT.  ____________________________________________    RADIOLOGY  Dg Chest Portable 1 View  Result Date: 11/09/2016 CLINICAL DATA:  Chest pain, dizziness beginning this evening. History of atrial fibrillation. EXAM: PORTABLE CHEST 1 VIEW COMPARISON:  None. FINDINGS: Lungs are adequately inflated without focal consolidation or effusion. Cardiomediastinal silhouette is within normal. There is mild calcified plaque over the thoracic aorta. Mild degenerate change of the spine. IMPRESSION: No acute cardiopulmonary disease. Aortic atherosclerosis. Electronically Signed   By: Elberta Fortis M.D.   On: 11/09/2016 19:17    ____________________________________________   PROCEDURES Procedures CRITICAL CARE Performed by: Scotty Court, Crysta Gulick   Total critical care time: 35 minutes  Critical care time was exclusive of separately billable procedures and treating other patients.  Critical care was necessary to treat or prevent imminent or life-threatening deterioration.  Critical care was time spent personally by me on the following activities: development of treatment plan with patient and/or surrogate as well as nursing, discussions with consultants, evaluation of patient's response to treatment, examination of patient, obtaining history from  patient or surrogate, ordering and performing treatments and interventions, ordering and review of laboratory studies, ordering and review of radiographic studies, pulse oximetry and re-evaluation of patient's condition.  ____________________________________________   INITIAL IMPRESSION / ASSESSMENT AND PLAN / ED COURSE  Pertinent labs & imaging results that were available during my care of the patient were reviewed by me and considered in my medical decision making (see chart for details).   Clinical Course as of Nov 10 2003  Sat Nov 09, 2016  0981 P/w SVT, converted to A fib RVR by EMS with 6mg  adenosine.  On arrival to ED, ekg shows SVT rate 197. Will give repeat adenosine. Plan to admit for further eval of new dysrhythmia.   [PS]  1903 Paroxysmal SVT, frequently in/out.  Frequent ectopy when not in SVT seems to be the cause. Will start dilt drip.   [PS]  1915 Continues to alternate between SVT rate 180 and a. Fib rate 110-130. Awaiting dilt and mag infusions.    [PS]  2004 NS bolus Sodium: (!) 127 [PS]    Clinical Course User Index [PS] Sharman Cheek, MD     ____________________________________________   FINAL CLINICAL IMPRESSION(S) / ED DIAGNOSES  Final diagnoses:  Paroxysmal SVT (supraventricular tachycardia) (HCC)  New onset atrial fibrillation (HCC)  Hyponatremia      New Prescriptions   No medications on file     Portions of this note were generated with dragon dictation software. Dictation errors may occur despite best attempts at proofreading.    Sharman Cheek, MD 11/09/16 2013

## 2016-11-09 NOTE — ED Triage Notes (Signed)
Per EMS pt has been having chest pain TID for one week with a flutter in chest.  When EMS arrived pt was in Afib at a HR of 120, then SVT at a HR of 200, then Afib at a HR of 120.  6mg  of Adenosine was given in route with no resolution.

## 2016-11-09 NOTE — ED Notes (Signed)
Pt decreased to 3L O2 which is what pt is chronically on at home.

## 2016-11-09 NOTE — ED Notes (Addendum)
Pt needs to toilet prior to to floor, this RN was transporting pt on 1A when called for transport at 2120

## 2016-11-09 NOTE — ED Notes (Signed)
Report delayed due to receiving RN unavailable at time. Will continue to hold for report.

## 2016-11-09 NOTE — ED Notes (Signed)
Pt provided warm blankets and mouth moisturizer.

## 2016-11-09 NOTE — ED Notes (Signed)
Pt assisted off bedpan att 

## 2016-11-09 NOTE — ED Notes (Signed)
Cardizem administration verified by April RN.

## 2016-11-10 ENCOUNTER — Inpatient Hospital Stay
Admit: 2016-11-10 | Discharge: 2016-11-10 | Disposition: A | Discharge status: Home / Self Care | Payer: Medicare Other | Attending: Internal Medicine | Admitting: Internal Medicine

## 2016-11-10 DIAGNOSIS — I4891 Unspecified atrial fibrillation: Secondary | ICD-10-CM | POA: Diagnosis not present

## 2016-11-10 LAB — BASIC METABOLIC PANEL
Anion gap: 3 — ABNORMAL LOW (ref 5–15)
BUN: 12 mg/dL (ref 6–20)
CHLORIDE: 107 mmol/L (ref 101–111)
CO2: 27 mmol/L (ref 22–32)
Calcium: 7.8 mg/dL — ABNORMAL LOW (ref 8.9–10.3)
Creatinine, Ser: 0.66 mg/dL (ref 0.44–1.00)
GFR calc Af Amer: >60 mL/min (ref >60)
GFR calc non Af Amer: >60 mL/min (ref >60)
GLUCOSE: 78 mg/dL (ref 65–99)
POTASSIUM: 4.4 mmol/L (ref 3.5–5.1)
Sodium: 137 mmol/L (ref 135–145)

## 2016-11-10 LAB — CBC
HEMATOCRIT: 29.7 % — AB (ref 35.0–47.0)
HEMOGLOBIN: 10.1 g/dL — AB (ref 12.0–16.0)
MCH: 31.6 pg (ref 26.0–34.0)
MCHC: 33.9 g/dL (ref 32.0–36.0)
MCV: 93.2 fL (ref 80.0–100.0)
Platelets: 277 10*3/uL (ref 150–440)
RBC: 3.18 MIL/uL — ABNORMAL LOW (ref 3.80–5.20)
RDW: 13.9 % (ref 11.5–14.5)
WBC: 5.8 10*3/uL (ref 3.6–11.0)

## 2016-11-10 LAB — ECHOCARDIOGRAM COMPLETE
Height: 60 in
Weight: 1780.8 oz

## 2016-11-10 LAB — TROPONIN I
Troponin I: <0.03 ng/mL (ref <0.03)
Troponin I: <0.03 ng/mL (ref <0.03)

## 2016-11-10 MED ORDER — METOPROLOL TARTRATE 25 MG PO TABS: 50.0000 mg | ORAL_TABLET | Freq: Two times a day (BID) | ORAL | 0 refills | Status: DC

## 2016-11-10 MED ORDER — SODIUM CHLORIDE 0.9 % IV BOLUS (SEPSIS)
1000.0000 mL | Freq: Once | INTRAVENOUS | Status: AC
  Administered 2016-11-10: 1000 mL via INTRAVENOUS

## 2016-11-10 MED ORDER — APIXABAN 5 MG PO TABS: 5.0000 mg | ORAL_TABLET | Freq: Two times a day (BID) | ORAL | 0 refills | Status: DC

## 2016-11-10 NOTE — Progress Notes (Signed)
Patient is admitted to room 253 with the diagnosis of new onset of Afib. Alert and oriented x 4. Denied any acute pain. No respiratory distress noted. Tele box reconciled to CCMD with Misty StanleyLisa as a second Training and development officerverifier. Skin assessment done with Misty StanleyLisa RN as well, no skin issues to report. Safety contract signed. Patient voiced no concern. Will continue to monitor.

## 2016-11-10 NOTE — Consult Note (Signed)
[facility]  Patient ID: Virginia [name], MRN: [id number], DOB/AGE: 1950-10-24 66 y.o. Admit date: 11/09/2016   Date of Consult: 11/10/2016 Primary Physician: Marguarite ArbourSparks, Jeffrey D, MD Primary Cardiologist:None  Chief Complaint:  Chief Complaint  Patient presents with  . Chest Pain  . Shortness of Breath   Reason for Consult: atrial fibrillation with rapid ventricular rate  HPI: 66 y.o. female with the known severe chronic obstructive pulmonary disease with chronic oxygen use who has had multiple other medical problems including essential hypertension and pneumonia is an exacerbation who is had new onset of atypical chest discomfort shortness of breath palpitations and was seen in the emergency room with an EKG showing atrial fibrillation with rapid ventricular rate. She does have the what appears to be some COPD with chronic oxygen use and currently there does not appear to be infection or pneumonia. The patient does have an abnormal thyroid which may be contributing to some of her tachycardia. There is a normal troponin without evidence of myocardial infarction and no current evidence of congestive heart failure although patient does have some lower extremity edema treated with the furosemide and stable. The patient was placed on diltiazem drip for heart rate control and oral metoprolol which she's had excellent heart rate control at this time in the 70 beat per minute range. Patient is continuing to be hemodynamically stable at this time  Past Medical History:  Diagnosis Date  . C. difficile colitis   . COPD (chronic obstructive pulmonary disease) (HCC)   . Depression   . Fibromyalgia   . H/O tracheostomy   . Hep C w/ coma, chronic (HCC)   . Hypertension   . MRSA pneumonia (HCC)   . Osteoporosis   . Peripheral neuropathy   . RLS (restless legs syndrome)   . S/P percutaneous endoscopic gastrostomy (PEG) tube placement [facility](HCC)       Surgical History:  Past  Surgical History:  Procedure Laterality Date  . ABDOMINAL HYSTERECTOMY    . ROTATOR CUFF REPAIR       Home Meds: Prior to Admission medications   Medication Sig Start Date End Date Taking? Authorizing Provider  aspirin 325 MG tablet Take 325 mg by mouth every 4 (four) hours as needed.   Yes [provider]  diazepam (VALIUM) 5 MG tablet Take 5 mg by mouth every 8 (eight) hours as needed. 10/22/16  Yes [provider]  estradiol (ESTRACE) 1 MG tablet Take 1 mg by mouth daily. 09/10/16  Yes [provider]  furosemide (LASIX) 20 MG tablet Take 20 mg by mouth daily.  10/01/16  Yes [provider]  HYDROcodone-acetaminophen (NORCO) 10-325 MG tablet Take 1 tablet by mouth every 6 (six) hours as needed. 10/23/16  Yes [provider]  INCRUSE ELLIPTA 62.5 MCG/INH AEPB Inhale 1 puff into the lungs daily. 10/24/16  Yes [provider]  lisinopril (PRINIVIL,ZESTRIL) 10 MG tablet Take 10 mg by mouth daily. 10/23/16  Yes [provider]  MAGNESIUM-OXIDE 400 (241.3 Mg) MG tablet Take 1 tablet by mouth daily. 08/24/16  Yes [provider]  meloxicam (MOBIC) 7.5 MG tablet Take 7.5 mg by mouth daily. 10/01/16  Yes [provider]  metoprolol tartrate (LOPRESSOR) 25 MG tablet Take 25 mg by mouth 2 (two) times daily. 09/20/16  Yes [provider]  Multiple Vitamin (MULTIVITAMIN) tablet Take 1 tablet by mouth daily.   Yes [provider]  Multiple Vitamins-Minerals (ZINC PO) Take 1 tablet by mouth daily.  Yes [provider]  PROAIR HFA 108 (90 Base) MCG/ACT inhaler Inhale 2 puffs into the lungs every 6 (six) hours as needed. 10/23/16  Yes [provider]  QUEtiapine (SEROQUEL) 25 MG tablet Take 25 mg by mouth daily. 10/01/16  Yes [provider]  SYMBICORT 80-4.5 MCG/ACT inhaler Inhale 2 puffs into the lungs 2 (two) times daily. 10/24/16  Yes [provider]    Inpatient Medications:  .  aspirin  325 mg Oral Daily  . enoxaparin (LOVENOX) injection  1 mg/kg Subcutaneous Q12H  . estradiol  1 mg Oral Daily  . fluticasone furoate-vilanterol  1 puff Inhalation Daily  . furosemide  20 mg Oral Daily  . lisinopril  10 mg Oral Daily  . magnesium oxide  400 mg Oral Daily  . meloxicam  7.5 mg Oral Daily  . metoprolol tartrate  25 mg Oral BID  . QUEtiapine  25 mg Oral Daily  . umeclidinium bromide  1 puff Inhalation Daily   . diltiazem (CARDIZEM) infusion Stopped (11/10/16 0028)    Allergies: Not on File  Social History   Social History  . Marital status: Single    Spouse name: N/A  . Number of children: N/A  . Years of education: N/A   Occupational History  . Not on file.   Social History Main Topics  . Smoking status: Former Games developer  . Smokeless tobacco: Never Used  . Alcohol use No  . Drug use: No  . Sexual activity: Not on file   Other Topics Concern  . Not on file   Social History Narrative  . No narrative on file     Family History  Problem Relation Age of Onset  . Hypertension Mother      Review of Systems Positive forShortness of breath chest pain cough congestion Negative for: General:  chills, fever, night sweats or weight changes.  Cardiovascular: PND orthopnea syncope dizziness  Dermatological skin lesions rashes Respiratory: Positive for Cough congestion Urologic: Frequent urination urination at night and hematuria Abdominal: negative for nausea, vomiting, diarrhea, bright red blood per rectum, melena, or hematemesis Neurologic: negative for visual changes, and/or hearing changes  All other systems reviewed and are otherwise negative except as noted above.  Labs:  Recent Labs  11/09/16 1854 11/09/16 2249 11/10/16 0410  TROPONINI <0.03 <0.03 <0.03   Lab Results  Component Value Date   WBC 5.8 11/10/2016   HGB 10.1 (L) 11/10/2016   HCT 29.7 (L) 11/10/2016   MCV 93.2 11/10/2016   PLT 277 11/10/2016    Recent Labs Lab  11/09/16 1854 11/10/16 0410  NA 127* 137  K 3.8 4.4  CL 90* 107  CO2 28 27  BUN 17 12  CREATININE 0.86 0.66  CALCIUM 9.2 7.8*  PROT 7.2  --   BILITOT 0.5  --   ALKPHOS 18*  --   ALT 19  --   AST 27  --   GLUCOSE 101* 78   No results found for: CHOL, HDL, LDLCALC, TRIG No results found for: DDIMER  Radiology/Studies:  Dg Chest Portable 1 View  Result Date: 11/09/2016 CLINICAL DATA:  Chest pain, dizziness beginning this evening. History of atrial fibrillation. EXAM: PORTABLE CHEST 1 VIEW COMPARISON:  None. FINDINGS: Lungs are adequately inflated without focal consolidation or effusion. Cardiomediastinal silhouette is within normal. There is mild calcified plaque over the thoracic aorta. Mild degenerate change of the spine. IMPRESSION: No acute cardiopulmonary disease. Aortic atherosclerosis. Electronically Signed   By: Elberta Fortis  M.D.   On: 11/09/2016 19:17    WUJ:WJXBJY fibrillation with rapid ventricular rate and nonspecific ST and T-wave changes  Weights: Filed Weights   11/09/16 1853 11/09/16 2244  Weight: 48.5 kg (107 lb) 50.5 kg (111 lb 4.8 oz)     Physical Exam: Blood pressure 123/68, pulse 69, temperature 98.3 F (36.8 C), temperature source Oral, resp. rate 16, height 5' (1.524 m), weight 50.5 kg (111 lb 4.8 oz), SpO2 100 %. Body mass index is 21.74 kg/m. General: Well developed, well nourished, in no acute distress. Head eyes ears nose throat: Normocephalic, atraumatic, sclera non-icteric, no xanthomas, nares are without discharge. No apparent thyromegaly and/or mass  Lungs: Normal respiratory effort.Diffuse wheezes, no rales, no rhonchi.  Heart: Irregular with normal S1 S2. no murmur gallop, no rub, PMI is normal size and placement, carotid upstroke normal without bruit, jugular venous pressure is normal Abdomen: Soft, non-tender, non-distended with normoactive bowel sounds. No hepatomegaly. No rebound/guarding. No obvious abdominal masses. Abdominal aorta is  normal size without bruit Extremities:Trace to 1+ edema. no cyanosis, no clubbing, no ulcers  Peripheral : 2+ bilateral upper extremity pulses, 2+ bilateral femoral pulses, 2+ bilateral dorsal pedal pulse Neuro: Alert and oriented. No facial asymmetry. No focal deficit. Moves all extremities spontaneously. Musculoskeletal: Normal muscle tone without kyphosis Psych:  Responds to questions appropriately with a normal affect.    Assessment: 66 year old female with severe chronic obstructive coronary disease on home oxygen having inflammatory issues with chest pain essential hypertension and atrial fibrillation with rapid ventricular rate without evidence of myocardial infarction  Plan: 1. Continue diltiazem drip and increase metoprolol for better heart rate control to 50 mg twice per day and then ability for discontinuation of drip 2. Consideration of anticoagulation with elequis 5 mg twice per day for further risk reduction in stroke with atrial fibrillation after transition with the Lovenox 3. Furosemide for lower extremity edema 4. Hypertension control with ACE inhibitor 5. Echocardiogram for LV systolic dysfunction pulmonary hypertension and other valvular heart disease contributing to above with further adjustments of medication management  Signed, Lamar Blinks M.D. [facility] Cardiology 11/10/2016, 9:10 AM

## 2016-11-10 NOTE — Progress Notes (Signed)
*  PRELIMINARY RESULTS* Echocardiogram 2D Echocardiogram has been performed.  Garrel Ridgelikeshia S Kyesha Balla 11/10/2016, 9:08 AM

## 2016-11-10 NOTE — Discharge Instructions (Signed)
Apixaban oral tablets °What is this medicine? °APIXABAN (a PIX a ban) is an anticoagulant (blood thinner). It is used to lower the chance of stroke in people with a medical condition called atrial fibrillation. It is also used to treat or prevent blood clots in the lungs or in the veins. °This medicine may be used for other purposes; ask your health care provider or pharmacist if you have questions. °COMMON BRAND NAME(S): Eliquis °What should I tell my health care provider before I take this medicine? °They need to know if you have any of these conditions: °-bleeding disorders °-bleeding in the brain °-blood in your stools (black or tarry stools) or if you have blood in your vomit °-history of stomach bleeding °-kidney disease °-liver disease °-mechanical heart valve °-an unusual or allergic reaction to apixaban, other medicines, foods, dyes, or preservatives °-pregnant or trying to get pregnant °-breast-feeding °How should I use this medicine? °Take this medicine by mouth with a glass of water. Follow the directions on the prescription label. You can take it with or without food. If it upsets your stomach, take it with food. Take your medicine at regular intervals. Do not take it more often than directed. Do not stop taking except on your doctor's advice. Stopping this medicine may increase your risk of a blot clot. Be sure to refill your prescription before you run out of medicine. °Talk to your pediatrician regarding the use of this medicine in children. Special care may be needed. °Overdosage: If you think you have taken too much of this medicine contact a poison control center or emergency room at once. °NOTE: This medicine is only for you. Do not share this medicine with others. °What if I miss a dose? °If you miss a dose, take it as soon as you can. If it is almost time for your next dose, take only that dose. Do not take double or extra doses. °What may interact with this medicine? °This medicine may  interact with the following: °-aspirin and aspirin-like medicines °-certain medicines for fungal infections like ketoconazole and itraconazole °-certain medicines for seizures like carbamazepine and phenytoin °-certain medicines that treat or prevent blood clots like warfarin, enoxaparin, and dalteparin °-clarithromycin °-NSAIDs, medicines for pain and inflammation, like ibuprofen or naproxen °-rifampin °-ritonavir °-St. John's wort °This list may not describe all possible interactions. Give your health care provider a list of all the medicines, herbs, non-prescription drugs, or dietary supplements you use. Also tell them if you smoke, drink alcohol, or use illegal drugs. Some items may interact with your medicine. °What should I watch for while using this medicine? °Visit your doctor or health care professional for regular checks on your progress. °Notify your doctor or health care professional and seek emergency treatment if you develop breathing problems; changes in vision; chest pain; severe, sudden headache; pain, swelling, warmth in the leg; trouble speaking; sudden numbness or weakness of the face, arm or leg. These can be signs that your condition has gotten worse. °If you are going to have surgery or other procedure, tell your doctor that you are taking this medicine. °What side effects may I notice from receiving this medicine? °Side effects that you should report to your doctor or health care professional as soon as possible: °-allergic reactions like skin rash, itching or hives, swelling of the face, lips, or tongue °-signs and symptoms of bleeding such as bloody or black, tarry stools; red or dark-brown urine; spitting up blood or brown material that looks like coffee   grounds; red spots on the skin; unusual bruising or bleeding from the eye, gums, or nose This list may not describe all possible side effects. Call your doctor for medical advice about side effects. You may report side effects to FDA at  1-800-FDA-1088. Where should I keep my medicine? Keep out of the reach of children. Store at room temperature between 20 and 25 degrees C (68 and 77 degrees F). Throw away any unused medicine after the expiration date. NOTE: This sheet is a summary. It may not cover all possible information. If you have questions about this medicine, talk to your doctor, pharmacist, or health care provider.  2018 Elsevier/Gold Standard (2016-01-01 11:54:23) Aspirin and Your Heart Aspirin is a medicine that affects the way blood clots. Aspirin can be used to help reduce the risk of blood clots, heart attacks, and other heart-related problems. Should I take aspirin? Your health care provider will help you determine whether it is safe and beneficial for you to take aspirin daily. Taking aspirin daily may be beneficial if you:  Have had a heart attack or chest pain.  Have undergone open heart surgery such as coronary artery bypass surgery (CABG).  Have had coronary angioplasty.  Have experienced a stroke or transient ischemic attack (TIA).  Have peripheral vascular disease (PVD).  Have chronic heart rhythm problems such as atrial fibrillation. Are there any risks of taking aspirin daily? Daily use of aspirin can increase your risk of side effects. Some of these include:  Bleeding. Bleeding problems can be minor or serious. An example of a minor problem is a cut that does not stop bleeding. An example of a more serious problem is stomach bleeding or bleeding into the brain. Your risk of bleeding is increased if you are also taking non-steroidal anti-inflammatory medicine (NSAIDs).  Increased bruising.  Upset stomach.  An allergic reaction. People who have nasal polyps have an increased risk of developing an aspirin allergy. What are some guidelines I should follow when taking aspirin?  Take aspirin only as directed by your health care provider. Make sure you understand how much you should take and what  form you should take. The two forms of aspirin are:  Non-enteric-coated. This type of aspirin does not have a coating and is absorbed quickly. Non-enteric-coated aspirin is usually recommended for people with chest pain. This type of aspirin also comes in a chewable form.  Enteric-coated. This type of aspirin has a special coating that releases the medicine very slowly. Enteric-coated aspirin causes less stomach upset than non-enteric-coated aspirin. This type of aspirin should not be chewed or crushed.  Drink alcohol in moderation. Drinking alcohol increases your risk of bleeding. When should I seek medical care?  You have unusual bleeding or bruising.  You have stomach pain.  You have an allergic reaction. Symptoms of an allergic reaction include:  Hives.  Itchy skin.  Swelling of the lips, tongue, or face.  You have ringing in your ears. When should I seek immediate medical care?  Your bowel movements are bloody, dark red, or black in color.  You vomit or cough up blood.  You have blood in your urine.  You cough, wheeze, or feel short of breath. If you have any of the following symptoms, this is an emergency. Do not wait to see if the pain will go away. Get medical help at once. Call your local emergency services (911 in the [place] Do not drive yourself to the hospital.  You have severe chest pain,  especially if the pain is crushing or pressure-like and spreads to the arms, back, neck, or jaw.  You have stroke-like symptoms, such as:  Loss of vision.  Difficulty talking.  Numbness or weakness on one side of your body.  Numbness or weakness in your arm or leg.  Not thinking clearly or feeling confused. This information is not intended to replace advice given to you by your health care provider. Make sure you discuss any questions you have with your health care provider. Document Released: 05/23/2008 Document Revised: 10/18/2015 Document Reviewed: 09/15/2013 Elsevier  Interactive Patient Education  2017 Elsevier Inc.  Atrial Fibrillation Atrial fibrillation is a type of irregular or rapid heartbeat (arrhythmia). In atrial fibrillation, the heart quivers continuously in a chaotic pattern. This occurs when parts of the heart receive disorganized signals that make the heart unable to pump blood normally. This can increase the risk for stroke, heart failure, and other heart-related conditions. There are different types of atrial fibrillation, including:  Paroxysmal atrial fibrillation. This type starts suddenly, and it usually stops on its own shortly after it starts.  Persistent atrial fibrillation. This type often lasts longer than a week. It may stop on its own or with treatment.  Long-lasting persistent atrial fibrillation. This type lasts longer than 12 months.  Permanent atrial fibrillation. This type does not go away. Talk with your health care provider to learn about the type of atrial fibrillation that you have. What are the causes? This condition is caused by some heart-related conditions or procedures, including:  A heart attack.  Coronary artery disease.  Heart failure.  Heart valve conditions.  High blood pressure.  Inflammation of the sac that surrounds the heart (pericarditis).  Heart surgery.  Certain heart rhythm disorders, such as Wolf-Parkinson-White syndrome. Other causes include:  Pneumonia.  Obstructive sleep apnea.  Blockage of an artery in the lungs (pulmonary embolism, or PE).  Lung cancer.  Chronic lung disease.  Thyroid problems, especially if the thyroid is overactive (hyperthyroidism).  Caffeine.  Excessive alcohol use or illegal drug use.  Use of some medicines, including certain decongestants and diet pills. Sometimes, the cause cannot be found. What increases the risk? This condition is more likely to develop in:  People who are older in age.  People who smoke.  People who have diabetes  mellitus.  People who are overweight (obese).  Athletes who exercise vigorously. What are the signs or symptoms? Symptoms of this condition include:  A feeling that your heart is beating rapidly or irregularly.  A feeling of discomfort or pain in your chest.  Shortness of breath.  Sudden light-headedness or weakness.  Getting tired easily during exercise. In some cases, there are no symptoms. How is this diagnosed? Your health care provider may be able to detect atrial fibrillation when taking your pulse. If detected, this condition may be diagnosed with:  An electrocardiogram (ECG).  A Holter monitor test that records your heartbeat patterns over a 24-hour period.  Transthoracic echocardiogram (TTE) to evaluate how blood flows through your heart.  Transesophageal echocardiogram (TEE) to view more detailed images of your heart.  A stress test.  Imaging tests, such as a CT scan or chest X-ray.  Blood tests. How is this treated? The main goals of treatment are to prevent blood clots from forming and to keep your heart beating at a normal rate and rhythm. The type of treatment that you receive depends on many factors, such as your underlying medical conditions and how you feel  when you are experiencing atrial fibrillation. This condition may be treated with:  Medicine to slow down the heart rate, bring the hearts rhythm back to normal, or prevent clots from forming.  Electrical cardioversion. This is a procedure that resets your hearts rhythm by delivering a controlled, low-energy shock to the heart through your skin.  Different types of ablation, such as catheter ablation, catheter ablation with pacemaker, or surgical ablation. These procedures destroy the heart tissues that send abnormal signals. When the pacemaker is used, it is placed under your skin to help your heart beat in a regular rhythm. Follow these instructions at home:  Take over-the counter and prescription  medicines only as told by your health care provider.  If your health care provider prescribed a blood-thinning medicine (anticoagulant), take it exactly as told. Taking too much blood-thinning medicine can cause bleeding. If you do not take enough blood-thinning medicine, you will not have the protection that you need against stroke and other problems.  Do not use tobacco products, including cigarettes, chewing tobacco, and e-cigarettes. If you need help quitting, ask your health care provider.  If you have obstructive sleep apnea, manage your condition as told by your health care provider.  Do not drink alcohol.  Do not drink beverages that contain caffeine, such as coffee, soda, and tea.  Maintain a healthy weight. Do not use diet pills unless your health care provider approves. Diet pills may make heart problems worse.  Follow diet instructions as told by your health care provider.  Exercise regularly as told by your health care provider.  Keep all follow-up visits as told by your health care provider. This is important. How is this prevented?  Avoid drinking beverages that contain caffeine or alcohol.  Avoid certain medicines, especially medicines that are used for breathing problems.  Avoid certain herbs and herbal medicines, such as those that contain ephedra or ginseng.  Do not use illegal drugs, such as cocaine and amphetamines.  Do not smoke.  Manage your high blood pressure. Contact a health care provider if:  You notice a change in the rate, rhythm, or strength of your heartbeat.  You are taking an anticoagulant and you notice increased bruising.  You tire more easily when you exercise or exert yourself. Get help right away if:  You have chest pain, abdominal pain, sweating, or weakness.  You feel nauseous.  You notice blood in your vomit, bowel movement, or urine.  You have shortness of breath.  You suddenly have swollen feet and ankles.  You feel  dizzy.  You have sudden weakness or numbness of the face, arm, or leg, especially on one side of the body.  You have trouble speaking, trouble understanding, or both (aphasia).  Your face or your eyelid droops on one side. These symptoms may represent a serious problem that is an emergency. Do not wait to see if the symptoms will go away. Get medical help right away. Call your local emergency services (911 in the [place] Do not drive yourself to the hospital. This information is not intended to replace advice given to you by your health care provider. Make sure you discuss any questions you have with your health care provider. Document Released: 06/10/2005 Document Revised: 10/18/2015 Document Reviewed: 10/05/2014 Elsevier Interactive Patient Education  2017 [employer].

## 2016-11-10 NOTE — Progress Notes (Signed)
Patient discharged via wheelchair and private vehicle. IV removed and catheter intact. All discharge instructions given and patient verbalizes understanding. Tele removed and returned. Prescriptions given to patient No distress noted.   

## 2016-11-10 NOTE — Discharge Summary (Signed)
[employer] [facility] [place] [facility]   PATIENT NAME: Virginia [name]    MR#:  [id number]  DATE OF BIRTH:  [birth year]  DATE OF ADMISSION:  11/09/2016 ADMITTING PHYSICIAN: Auburn Bilberry, MD  DATE OF DISCHARGE: 11/10/2016  PRIMARY CARE PHYSICIAN: Marguarite Arbour, MD    ADMISSION DIAGNOSIS:  Hyponatremia [E87.1] New onset atrial fibrillation (HCC) [I48.91] Paroxysmal SVT (supraventricular tachycardia) (HCC) [I47.1]  DISCHARGE DIAGNOSIS:  Active Problems:   A-fib (HCC)   SECONDARY DIAGNOSIS:   Past Medical History:  Diagnosis Date  . C. difficile colitis   . COPD (chronic obstructive pulmonary disease) (HCC)   . Depression   . Fibromyalgia   . H/O tracheostomy   . Hep C w/ coma, chronic (HCC)   . Hypertension   . MRSA pneumonia (HCC)   . Osteoporosis   . Peripheral neuropathy   . RLS (restless legs syndrome)   . S/P percutaneous endoscopic gastrostomy (PEG) tube placement [facility])     HOSPITAL COURSE:   66 year old female with a history of COPD and chronic respiratory failure who presents with new onset atrial fibrillation.  1. New onset atrial fibrillation with RVR and now she is converted to normal sinus rhythm: Patient was initiated on diltiazem drip. Cardiology was consulted. Her heart rates are now better controlled. She will be discharged with increased dose of metoprolol and anticoagulation with Eliquis. She will have close follow-up with cardiology. Echocardiogram showed normal ejection fraction without valvular abnormalities.   2. Chronic respiratory failure on 3 L of oxygen due to COPD: Patient will continue oxygen She will continue inhalers 3. Essential hypertension: Because we increased the dose of metoprolol lisinopril has been discontinued.  4. Anxiety and depression: Patient will continue on diazepam and Seroquel   DISCHARGE CONDITIONS AND DIET:   Stable for discharge on heart healthy diet  CONSULTS OBTAINED:  Treatment Team:   Lamar Blinks, MD  DRUG ALLERGIES:  Not on File  DISCHARGE MEDICATIONS:   Current Discharge Medication List    START taking these medications   Details  apixaban (ELIQUIS) 5 MG TABS tablet Take 1 tablet (5 mg total) by mouth 2 (two) times daily. Qty: 60 tablet, Refills: 0      CONTINUE these medications which have CHANGED   Details  metoprolol tartrate (LOPRESSOR) 25 MG tablet Take 2 tablets (50 mg total) by mouth 2 (two) times daily. Qty: 60 tablet, Refills: 0      CONTINUE these medications which have NOT CHANGED   Details  diazepam (VALIUM) 5 MG tablet Take 5 mg by mouth every 8 (eight) hours as needed. Refills: 5    estradiol (ESTRACE) 1 MG tablet Take 1 mg by mouth daily. Refills: 1    furosemide (LASIX) 20 MG tablet Take 20 mg by mouth daily.  Refills: 1    HYDROcodone-acetaminophen (NORCO) 10-325 MG tablet Take 1 tablet by mouth every 6 (six) hours as needed. Refills: 0    INCRUSE ELLIPTA 62.5 MCG/INH AEPB Inhale 1 puff into the lungs daily. Refills: 11    MAGNESIUM-OXIDE 400 (241.3 Mg) MG tablet Take 1 tablet by mouth daily. Refills: 1    meloxicam (MOBIC) 7.5 MG tablet Take 7.5 mg by mouth daily. Refills: 1    Multiple Vitamin (MULTIVITAMIN) tablet Take 1 tablet by mouth daily.    Multiple Vitamins-Minerals (ZINC PO) Take 1 tablet by mouth daily.    PROAIR HFA 108 (90 Base) MCG/ACT inhaler Inhale 2 puffs into the lungs every 6 (six) hours as  needed. Refills: 11    QUEtiapine (SEROQUEL) 25 MG tablet Take 25 mg by mouth daily. Refills: 1    SYMBICORT 80-4.5 MCG/ACT inhaler Inhale 2 puffs into the lungs 2 (two) times daily. Refills: 2      STOP taking these medications     aspirin 325 MG tablet      lisinopril (PRINIVIL,ZESTRIL) 10 MG tablet           Today   CHIEF COMPLAINT:  She is feeling well this morning. She is converted into normal sinus rhythm. She is denying shortness of breath or chest pain   VITAL SIGNS:  Blood  pressure 121/86, pulse 74, temperature 98.3 F (36.8 C), temperature source Oral, resp. rate 16, height 5' (1.524 m), weight 50.5 kg (111 lb 4.8 oz), SpO2 100 %.   REVIEW OF SYSTEMS:  Review of Systems  Constitutional: Negative.  Negative for chills, fever and malaise/fatigue.  HENT: Negative.  Negative for ear discharge, ear pain, hearing loss, nosebleeds and sore throat.   Eyes: Negative.  Negative for blurred vision and pain.  Respiratory: Negative.  Negative for cough, hemoptysis, shortness of breath and wheezing.   Cardiovascular: Negative.  Negative for chest pain, palpitations and leg swelling.  Gastrointestinal: Negative.  Negative for abdominal pain, blood in stool, diarrhea, nausea and vomiting.  Genitourinary: Negative.  Negative for dysuria.  Musculoskeletal: Negative.  Negative for back pain.  Skin: Negative.   Neurological: Negative for dizziness, tremors, speech change, focal weakness, seizures and headaches.  Endo/Heme/Allergies: Negative.  Does not bruise/bleed easily.  Psychiatric/Behavioral: Negative.  Negative for depression, hallucinations and suicidal ideas.     PHYSICAL EXAMINATION:  GENERAL:  66 y.o.-year-old patient lying in the bed with no acute distress.  NECK:  Supple, no jugular venous distention. No thyroid enlargement, no tenderness.  LUNGS: Normal breath sounds bilaterally, no wheezing, rales,rhonchi  No use of accessory muscles of respiration.  CARDIOVASCULAR: S1, S2 normal. No murmurs, rubs, or gallops.  ABDOMEN: Soft, non-tender, non-distended. Bowel sounds present. No organomegaly or mass.  EXTREMITIES: No pedal edema, cyanosis, or clubbing.  PSYCHIATRIC: The patient is alert and oriented x 3.  SKIN: No obvious rash, lesion, or ulcer.   DATA REVIEW:   CBC  Recent Labs Lab 11/10/16 0410  WBC 5.8  HGB 10.1*  HCT 29.7*  PLT 277    Chemistries   Recent Labs Lab 11/09/16 1854 11/10/16 0410  NA 127* 137  K 3.8 4.4  CL 90* 107  CO2 28  27  GLUCOSE 101* 78  BUN 17 12  CREATININE 0.86 0.66  CALCIUM 9.2 7.8*  MG 1.9  --   AST 27  --   ALT 19  --   ALKPHOS 18*  --   BILITOT 0.5  --     Cardiac Enzymes  Recent Labs Lab 11/09/16 1854 11/09/16 2249 11/10/16 0410  TROPONINI <0.03 <0.03 <0.03    Microbiology Results  @MICRORSLT48 @  RADIOLOGY:  Dg Chest Portable 1 View  Result Date: 11/09/2016 CLINICAL DATA:  Chest pain, dizziness beginning this evening. History of atrial fibrillation. EXAM: PORTABLE CHEST 1 VIEW COMPARISON:  None. FINDINGS: Lungs are adequately inflated without focal consolidation or effusion. Cardiomediastinal silhouette is within normal. There is mild calcified plaque over the thoracic aorta. Mild degenerate change of the spine. IMPRESSION: No acute cardiopulmonary disease. Aortic atherosclerosis. Electronically Signed   By: Elberta Fortisaniel  Boyle M.D.   On: 11/09/2016 19:17      Current Discharge Medication List    START  taking these medications   Details  apixaban (ELIQUIS) 5 MG TABS tablet Take 1 tablet (5 mg total) by mouth 2 (two) times daily. Qty: 60 tablet, Refills: 0      CONTINUE these medications which have CHANGED   Details  metoprolol tartrate (LOPRESSOR) 25 MG tablet Take 2 tablets (50 mg total) by mouth 2 (two) times daily. Qty: 60 tablet, Refills: 0      CONTINUE these medications which have NOT CHANGED   Details  diazepam (VALIUM) 5 MG tablet Take 5 mg by mouth every 8 (eight) hours as needed. Refills: 5    estradiol (ESTRACE) 1 MG tablet Take 1 mg by mouth daily. Refills: 1    furosemide (LASIX) 20 MG tablet Take 20 mg by mouth daily.  Refills: 1    HYDROcodone-acetaminophen (NORCO) 10-325 MG tablet Take 1 tablet by mouth every 6 (six) hours as needed. Refills: 0    INCRUSE ELLIPTA 62.5 MCG/INH AEPB Inhale 1 puff into the lungs daily. Refills: 11    MAGNESIUM-OXIDE 400 (241.3 Mg) MG tablet Take 1 tablet by mouth daily. Refills: 1    meloxicam (MOBIC) 7.5 MG tablet  Take 7.5 mg by mouth daily. Refills: 1    Multiple Vitamin (MULTIVITAMIN) tablet Take 1 tablet by mouth daily.    Multiple Vitamins-Minerals (ZINC PO) Take 1 tablet by mouth daily.    PROAIR HFA 108 (90 Base) MCG/ACT inhaler Inhale 2 puffs into the lungs every 6 (six) hours as needed. Refills: 11    QUEtiapine (SEROQUEL) 25 MG tablet Take 25 mg by mouth daily. Refills: 1    SYMBICORT 80-4.5 MCG/ACT inhaler Inhale 2 puffs into the lungs 2 (two) times daily. Refills: 2      STOP taking these medications     aspirin 325 MG tablet      lisinopril (PRINIVIL,ZESTRIL) 10 MG tablet            Management plans discussed with the patient and she is in agreement. Stable for discharge home  Patient should follow up with cardiology  CODE STATUS:     Code Status Orders        Start     Ordered   11/09/16 2236  Full code  Continuous     11/09/16 2235    Code Status History    Date Active Date Inactive Code Status Order ID Comments User Context   This patient has a current code status but no historical code status.    Advance Directive Documentation     Most Recent Value  Type of Advance Directive  Healthcare Power of Attorney  Pre-existing out of facility DNR order (yellow form or pink MOST form)  -  "MOST" Form in Place?  -      TOTAL TIME TAKING CARE OF THIS PATIENT: 37 minutes.    Note: This dictation was prepared with Dragon dictation along with smaller phrase technology. Any transcriptional errors that result from this process are unintentional.  Faron Tudisco M.D on 11/10/2016 at 9:50 AM  Between 7am to 6pm - Pager - [phone] After 6pm go to [email or web address] - Social research officer, government  [facility]  Office  [phone]  CC: Primary care physician; Marguarite Arbour, MD

## 2016-11-10 NOTE — Progress Notes (Signed)
BP=84/49 and HR fluctuating 63 -68, The Cardizem drip stopped and paged DR. Willis.  Dr. Anne HahnWillis called back with a new order to give  1 L of NS. Patient is asymptomatic and talkative. Will continue to monitor.

## 2016-11-10 NOTE — Progress Notes (Signed)
Noted Metoprolol 25 mg scheduled alongside the Cardizem and the RN called Dr.  Anne HahnWillis to hold the Metoprolol, Dr. Anne HahnWillis insisted to  give the metoprolol, and stated  the drip can be stopped should the  HR or BP drop out of  The parameter. Will continue to monitor.

## 2016-11-11 LAB — URINE CULTURE

## 2017-01-03 ENCOUNTER — Other Ambulatory Visit: Payer: Self-pay | Admitting: Student

## 2017-01-03 DIAGNOSIS — M12811 Other specific arthropathies, not elsewhere classified, right shoulder: Secondary | ICD-10-CM

## 2017-01-09 ENCOUNTER — Ambulatory Visit
Admission: RE | Admit: 2017-01-09 | Discharge: 2017-01-09 | Disposition: A | Discharge status: Home / Self Care | Payer: Medicare Other | Source: Ambulatory Visit | Attending: Student | Admitting: Student

## 2017-01-09 ENCOUNTER — Encounter: Payer: Self-pay | Admitting: Radiology

## 2017-01-09 DIAGNOSIS — M6250 Muscle wasting and atrophy, not elsewhere classified, unspecified site: Secondary | ICD-10-CM | POA: Insufficient documentation

## 2017-01-09 DIAGNOSIS — M659 Synovitis and tenosynovitis, unspecified: Secondary | ICD-10-CM | POA: Insufficient documentation

## 2017-01-09 DIAGNOSIS — M12811 Other specific arthropathies, not elsewhere classified, right shoulder: Secondary | ICD-10-CM | POA: Diagnosis present

## 2017-01-09 DIAGNOSIS — M75101 Unspecified rotator cuff tear or rupture of right shoulder, not specified as traumatic: Secondary | ICD-10-CM | POA: Diagnosis not present

## 2017-01-24 DIAGNOSIS — R0602 Shortness of breath: Secondary | ICD-10-CM | POA: Insufficient documentation

## 2017-02-20 ENCOUNTER — Encounter
Admission: RE | Admit: 2017-02-20 | Discharge: 2017-02-20 | Disposition: A | Discharge status: Home / Self Care | Payer: Medicare Other | Source: Ambulatory Visit | Attending: Surgery | Admitting: Surgery

## 2017-02-20 DIAGNOSIS — Z01812 Encounter for preprocedural laboratory examination: Secondary | ICD-10-CM | POA: Diagnosis present

## 2017-02-20 DIAGNOSIS — M75100 Unspecified rotator cuff tear or rupture of unspecified shoulder, not specified as traumatic: Secondary | ICD-10-CM | POA: Diagnosis not present

## 2017-02-20 HISTORY — DX: Anxiety disorder, unspecified: F41.9

## 2017-02-20 HISTORY — DX: Ventilator associated pneumonia: J95.851

## 2017-02-20 HISTORY — DX: Unspecified atrial fibrillation: I48.91

## 2017-02-20 HISTORY — DX: Anemia, unspecified: D64.9

## 2017-02-20 HISTORY — DX: Unspecified osteoarthritis, unspecified site: M19.90

## 2017-02-20 HISTORY — DX: Dependence on supplemental oxygen: Z99.81

## 2017-02-20 HISTORY — DX: Cardiac arrhythmia, unspecified: I49.9

## 2017-02-20 HISTORY — DX: Dyspnea, unspecified: R06.00

## 2017-02-20 LAB — BASIC METABOLIC PANEL
ANION GAP: 10 (ref 5–15)
BUN: 24 mg/dL — AB (ref 6–20)
CO2: 30 mmol/L (ref 22–32)
Calcium: 9.5 mg/dL (ref 8.9–10.3)
Chloride: 95 mmol/L — ABNORMAL LOW (ref 101–111)
Creatinine, Ser: 1.07 mg/dL — ABNORMAL HIGH (ref 0.44–1.00)
GFR calc Af Amer: >60 mL/min (ref >60)
GFR calc non Af Amer: 53 mL/min — ABNORMAL LOW (ref >60)
GLUCOSE: 81 mg/dL (ref 65–99)
POTASSIUM: 3.9 mmol/L (ref 3.5–5.1)
Sodium: 135 mmol/L (ref 135–145)

## 2017-02-20 LAB — URINALYSIS, COMPLETE (UACMP) WITH MICROSCOPIC
Bacteria, UA: NONE SEEN
Bilirubin Urine: NEGATIVE
Glucose, UA: NEGATIVE mg/dL
HGB URINE DIPSTICK: NEGATIVE
Ketones, ur: NEGATIVE mg/dL
Leukocytes, UA: NEGATIVE
NITRITE: NEGATIVE
PH: 7 (ref 5.0–8.0)
Protein, ur: NEGATIVE mg/dL
SPECIFIC GRAVITY, URINE: 1.004 — AB (ref 1.005–1.030)

## 2017-02-20 LAB — TYPE AND SCREEN
ABO/RH(D): B POS
ANTIBODY SCREEN: NEGATIVE

## 2017-02-20 LAB — CBC
HEMATOCRIT: 37.2 % (ref 35.0–47.0)
Hemoglobin: 12.4 g/dL (ref 12.0–16.0)
MCH: 30.9 pg (ref 26.0–34.0)
MCHC: 33.5 g/dL (ref 32.0–36.0)
MCV: 92.1 fL (ref 80.0–100.0)
PLATELETS: 353 10*3/uL (ref 150–440)
RBC: 4.03 MIL/uL (ref 3.80–5.20)
RDW: 14.6 % — AB (ref 11.5–14.5)
WBC: 8.6 10*3/uL (ref 3.6–11.0)

## 2017-02-20 LAB — PROTIME-INR
INR: 1.08
Prothrombin Time: 13.9 seconds (ref 11.4–15.2)

## 2017-02-20 LAB — SURGICAL PCR SCREEN
MRSA, PCR: NEGATIVE
STAPHYLOCOCCUS AUREUS: NEGATIVE

## 2017-02-20 NOTE — Patient Instructions (Signed)
Your procedure is scheduled on: March 06, 2017 (THURSDAY) Report to Same Day Surgery 2nd floor medical mall (Medical Mall Entrance-take elevator on left to 2nd floor.  Check in with surgery information desk.) To find out your arrival time please call [phone] between 1PM - 3PM on March 05, 2017  [facility](WEDNESDAY )  Remember: Instructions that are not followed completely may result in serious medical risk, up to and including death, or upon the discretion of your surgeon and anesthesiologist your surgery may need to be rescheduled.    _x___ 1. Do not eat food after midnight the night before your procedure. You may drink clear liquids up to 2 hours before you are scheduled to arrive at the hospital for your procedure.  Do not drink clear liquids within 2 hours of your scheduled arrival to the hospital.  Clear liquids include  --Water or Apple juice without pulp  --Clear carbohydrate beverage such as ClearFast or Gatorade  --Black Coffee or Clear Tea (No milk, no creamers, do not add anything to                  the coffee or Tea Type 1 and type 2 diabetics should only drink water.  No gum chewing or hard candies.     __x__ 2. No Alcohol for 24 hours before or after surgery.   __x__3. No Smoking for 24 prior to surgery.   ____  4. Bring all medications with you on the day of surgery if instructed.    __x__ 5. Notify your doctor if there is any change in your medical condition     (cold, fever, infections).     Do not wear jewelry, make-up, hairpins, clips or nail polish.  Do not wear lotions, powders, or perfumes.   Do not shave 48 hours prior to surgery. Men may shave face and neck.  Do not bring valuables to the hospital.    [facility] not [facility] for any belongings or valuables.               Contacts, dentures or bridgework may not be worn into surgery.  Leave your suitcase in the car. After surgery it may be brought to your room.  For patients admitted to the  hospital, discharge time is determined by your treatment team               Patients discharged the day of surgery will not be allowed to drive home.  You will need someone to drive you home and stay with you the night of your procedure.    Please read over the following fact sheets that you were given:   [facility] Preparing [facility] and or MRSA Information   TAKE THE FOLLOWING MEDICATIONS WITH A SIP OF WATER THE MORNING OF SURGERY   1. METOPROLOL  2. MAGNESIUM OXIDE  3. VALIUM (IF NEEDED )  4.  5.  6.  ____Fleets enema or Magnesium Citrate as directed.   _x___ Use CHG Soap or sage wipes as directed on instruction sheet   _X___ Use inhalers on the day of surgery and bring to hospital day of surgery (USE ALBUTEROL NEBULIZER, INCRUSE ELLIPTA, SYMBICORT INHALERS THE MORNING OF SURGERY AND BRING INHALERS TO HOSPITAL THE DAY OF SURGERY )  ____ Stop Metformin and Janumet 2 days prior to surgery.    ____ Take 1/2 of usual insulin dose the night before surgery and none on the morning surgery  _x___ Follow recommendations from Cardiologist, Pulmonologist or PCP regarding          stopping Aspirin, Coumadin, Plavix ,Eliquis, Effient, or Pradaxa, and Pletal. (STOP ELIQUIS  THREE DAYS PRIOR TO SURGERY , STOP ON SEPTEMBER 11 )  X____Stop Anti-inflammatories such as Advil, Aleve, Ibuprofen, Motrin, Naproxen, Naprosyn, Goodies powders or aspirin products. OK to take Tylenol (STOP MELOXICAM ONE WEEK PRIOR TO SURGERY )   _x___ Stop supplements until after surgery.  But may continue Vitamin D, Vitamin B, and multivitamin.       ____ Bring C-Pap to the hospital.

## 2017-02-20 NOTE — Pre-Procedure Instructions (Signed)
Component Name Value Ref Range  Vent Rate (bpm) 63   PR Interval (msec) 174   QRS Interval (msec) 78   QT Interval (msec) 388   QTc (msec) 397   Result Narrative  Sinus rhythm with premature atrial complexes Septal infarct (cited on or before 24-May-2016) Abnormal ECG When compared with ECG of 24-May-2016 15:53, No significant change was found I reviewed and concur with this report. Electronically signed WU:[email or web address]:KOWALSKI MD, Smitty CordsBRUCE [phone]([id number]) on 11/27/2016 12:44:14 PM

## 2017-02-20 NOTE — Pre-Procedure Instructions (Signed)
Dr. Priscella MannPenwarden notified of pulmonary clearance.

## 2017-02-21 LAB — URINE CULTURE: CULTURE: NO GROWTH

## 2017-03-05 MED ORDER — CEFAZOLIN SODIUM-DEXTROSE 2-4 GM/100ML-% IV SOLN
2.0000 g | Freq: Once | INTRAVENOUS | Status: AC
  Administered 2017-03-06: 2 g via INTRAVENOUS

## 2017-03-06 ENCOUNTER — Inpatient Hospital Stay: Payer: Medicare Other

## 2017-03-06 ENCOUNTER — Encounter
Admission: RE | Disposition: A | Discharge status: Home Health | Payer: Self-pay | Source: Ambulatory Visit | Attending: Surgery

## 2017-03-06 ENCOUNTER — Inpatient Hospital Stay: Payer: Medicare Other | Admitting: Anesthesiology

## 2017-03-06 ENCOUNTER — Inpatient Hospital Stay
Admission: RE | Admit: 2017-03-06 | Discharge: 2017-03-07 | DRG: 483 | Disposition: A | Discharge status: Home Health | Payer: Medicare Other | Source: Ambulatory Visit | Attending: Surgery | Admitting: Surgery

## 2017-03-06 DIAGNOSIS — M129 Arthropathy, unspecified: Secondary | ICD-10-CM | POA: Diagnosis present

## 2017-03-06 DIAGNOSIS — Z87891 Personal history of nicotine dependence: Secondary | ICD-10-CM

## 2017-03-06 DIAGNOSIS — M797 Fibromyalgia: Secondary | ICD-10-CM | POA: Diagnosis present

## 2017-03-06 DIAGNOSIS — I4891 Unspecified atrial fibrillation: Secondary | ICD-10-CM | POA: Diagnosis present

## 2017-03-06 DIAGNOSIS — J449 Chronic obstructive pulmonary disease, unspecified: Secondary | ICD-10-CM | POA: Diagnosis present

## 2017-03-06 DIAGNOSIS — B182 Chronic viral hepatitis C: Secondary | ICD-10-CM | POA: Diagnosis present

## 2017-03-06 DIAGNOSIS — Z9981 Dependence on supplemental oxygen: Secondary | ICD-10-CM | POA: Diagnosis not present

## 2017-03-06 DIAGNOSIS — M75101 Unspecified rotator cuff tear or rupture of right shoulder, not specified as traumatic: Secondary | ICD-10-CM | POA: Diagnosis present

## 2017-03-06 DIAGNOSIS — I10 Essential (primary) hypertension: Secondary | ICD-10-CM | POA: Diagnosis present

## 2017-03-06 DIAGNOSIS — M810 Age-related osteoporosis without current pathological fracture: Secondary | ICD-10-CM | POA: Diagnosis present

## 2017-03-06 DIAGNOSIS — Z23 Encounter for immunization: Secondary | ICD-10-CM

## 2017-03-06 DIAGNOSIS — G2581 Restless legs syndrome: Secondary | ICD-10-CM | POA: Diagnosis present

## 2017-03-06 DIAGNOSIS — Z96611 Presence of right artificial shoulder joint: Secondary | ICD-10-CM

## 2017-03-06 HISTORY — PX: REVERSE SHOULDER ARTHROPLASTY: SHX5054

## 2017-03-06 LAB — ABO/RH: ABO/RH(D): B POS

## 2017-03-06 SURGERY — ARTHROPLASTY, SHOULDER, TOTAL, REVERSE
Anesthesia: General | Site: Shoulder | Laterality: Right | Wound class: Clean

## 2017-03-06 MED ORDER — LIDOCAINE HCL (CARDIAC) 20 MG/ML IV SOLN
INTRAVENOUS | Status: DC | PRN
  Administered 2017-03-06 (×2): 50 mg via INTRAVENOUS

## 2017-03-06 MED ORDER — BUPIVACAINE LIPOSOME 1.3 % IJ SUSP
INTRAMUSCULAR | Status: AC
  Filled 2017-03-06: qty 20

## 2017-03-06 MED ORDER — BISACODYL 10 MG RE SUPP: 10.0000 mg | Freq: Every day | RECTAL | Status: DC | PRN

## 2017-03-06 MED ORDER — LACTATED RINGERS IV SOLN
INTRAVENOUS | Status: DC
  Administered 2017-03-06: 08:00:00 via INTRAVENOUS

## 2017-03-06 MED ORDER — ONDANSETRON HCL 4 MG/2ML IJ SOLN: 4.0000 mg | Freq: Four times a day (QID) | INTRAMUSCULAR | Status: DC | PRN

## 2017-03-06 MED ORDER — ALBUTEROL SULFATE (2.5 MG/3ML) 0.083% IN NEBU
2.5000 mg | INHALATION_SOLUTION | Freq: Three times a day (TID) | RESPIRATORY_TRACT | Status: DC
  Administered 2017-03-06: 2.5 mg via RESPIRATORY_TRACT
  Filled 2017-03-06: qty 3

## 2017-03-06 MED ORDER — SUCCINYLCHOLINE CHLORIDE 20 MG/ML IJ SOLN
INTRAMUSCULAR | Status: AC
  Filled 2017-03-06: qty 1

## 2017-03-06 MED ORDER — CEFAZOLIN SODIUM-DEXTROSE 2-4 GM/100ML-% IV SOLN
2.0000 g | Freq: Four times a day (QID) | INTRAVENOUS | Status: AC
  Administered 2017-03-06 – 2017-03-07 (×3): 2 g via INTRAVENOUS
  Filled 2017-03-06 (×3): qty 100

## 2017-03-06 MED ORDER — INFLUENZA VAC SPLIT HIGH-DOSE 0.5 ML IM SUSY
0.5000 mL | PREFILLED_SYRINGE | INTRAMUSCULAR | Status: AC
  Administered 2017-03-07: 0.5 mL via INTRAMUSCULAR
  Filled 2017-03-06 (×2): qty 0.5

## 2017-03-06 MED ORDER — KCL IN DEXTROSE-NACL 20-5-0.9 MEQ/L-%-% IV SOLN
INTRAVENOUS | Status: DC
  Administered 2017-03-06: 13:00:00 via INTRAVENOUS
  Filled 2017-03-06 (×3): qty 1000

## 2017-03-06 MED ORDER — HYDROMORPHONE HCL 1 MG/ML IJ SOLN: 0.5000 mg | INTRAMUSCULAR | Status: DC | PRN

## 2017-03-06 MED ORDER — BUPIVACAINE-EPINEPHRINE (PF) 0.5% -1:200000 IJ SOLN
INTRAMUSCULAR | Status: AC
  Filled 2017-03-06: qty 30

## 2017-03-06 MED ORDER — ROCURONIUM BROMIDE 100 MG/10ML IV SOLN
INTRAVENOUS | Status: DC | PRN
  Administered 2017-03-06: 10 mg via INTRAVENOUS
  Administered 2017-03-06: 30 mg via INTRAVENOUS
  Administered 2017-03-06: 10 mg via INTRAVENOUS

## 2017-03-06 MED ORDER — ALBUTEROL SULFATE (2.5 MG/3ML) 0.083% IN NEBU: 3.0000 mL | INHALATION_SOLUTION | Freq: Four times a day (QID) | RESPIRATORY_TRACT | Status: DC | PRN

## 2017-03-06 MED ORDER — ROCURONIUM BROMIDE 50 MG/5ML IV SOLN
INTRAVENOUS | Status: AC
  Filled 2017-03-06: qty 1

## 2017-03-06 MED ORDER — ACETAMINOPHEN 325 MG PO TABS: 650.0000 mg | ORAL_TABLET | Freq: Four times a day (QID) | ORAL | Status: DC | PRN

## 2017-03-06 MED ORDER — ACETAMINOPHEN 10 MG/ML IV SOLN
INTRAVENOUS | Status: AC
  Filled 2017-03-06: qty 100

## 2017-03-06 MED ORDER — PROPOFOL 10 MG/ML IV BOLUS
INTRAVENOUS | Status: AC
  Filled 2017-03-06: qty 20

## 2017-03-06 MED ORDER — ESTRADIOL 1 MG PO TABS
1.0000 mg | ORAL_TABLET | Freq: Every day | ORAL | Status: DC
  Administered 2017-03-07: 1 mg via ORAL
  Filled 2017-03-06 (×2): qty 1

## 2017-03-06 MED ORDER — NEOMYCIN-POLYMYXIN B GU 40-200000 IR SOLN
Status: AC
Start: 2017-03-06 — End: ?
  Filled 2017-03-06: qty 20

## 2017-03-06 MED ORDER — PROPOFOL 10 MG/ML IV BOLUS
INTRAVENOUS | Status: DC | PRN
  Administered 2017-03-06: 80 mg via INTRAVENOUS

## 2017-03-06 MED ORDER — KETOROLAC TROMETHAMINE 15 MG/ML IJ SOLN
7.5000 mg | Freq: Four times a day (QID) | INTRAMUSCULAR | Status: AC
  Administered 2017-03-06 (×3): 7.5 mg via INTRAVENOUS
  Filled 2017-03-06 (×3): qty 1

## 2017-03-06 MED ORDER — UMECLIDINIUM BROMIDE 62.5 MCG/INH IN AEPB
1.0000 | INHALATION_SPRAY | Freq: Every day | RESPIRATORY_TRACT | Status: DC
  Administered 2017-03-07: 10:00:00 1 via RESPIRATORY_TRACT
  Filled 2017-03-06: qty 7

## 2017-03-06 MED ORDER — ACETAMINOPHEN 500 MG PO TABS
1000.0000 mg | ORAL_TABLET | Freq: Four times a day (QID) | ORAL | Status: AC
  Administered 2017-03-06 (×3): 1000 mg via ORAL
  Filled 2017-03-06 (×4): qty 2

## 2017-03-06 MED ORDER — ALBUTEROL SULFATE (2.5 MG/3ML) 0.083% IN NEBU: 2.5000 mg | INHALATION_SOLUTION | RESPIRATORY_TRACT | Status: DC

## 2017-03-06 MED ORDER — OXYCODONE HCL 5 MG PO TABS
5.0000 mg | ORAL_TABLET | ORAL | Status: DC | PRN
  Administered 2017-03-06: 10 mg via ORAL
  Administered 2017-03-06 (×2): 5 mg via ORAL
  Administered 2017-03-06 – 2017-03-07 (×6): 10 mg via ORAL
  Filled 2017-03-06: qty 2
  Filled 2017-03-06: qty 1
  Filled 2017-03-06 (×3): qty 2
  Filled 2017-03-06: qty 1
  Filled 2017-03-06 (×3): qty 2

## 2017-03-06 MED ORDER — FENTANYL CITRATE (PF) 100 MCG/2ML IJ SOLN
INTRAMUSCULAR | Status: AC
  Administered 2017-03-06: 25 ug via INTRAVENOUS
  Filled 2017-03-06: qty 2

## 2017-03-06 MED ORDER — MOMETASONE FURO-FORMOTEROL FUM 100-5 MCG/ACT IN AERO
2.0000 | INHALATION_SPRAY | Freq: Two times a day (BID) | RESPIRATORY_TRACT | Status: DC
  Administered 2017-03-06 – 2017-03-07 (×2): 2 via RESPIRATORY_TRACT
  Filled 2017-03-06: qty 8.8

## 2017-03-06 MED ORDER — FENTANYL CITRATE (PF) 100 MCG/2ML IJ SOLN
25.0000 ug | INTRAMUSCULAR | Status: AC | PRN
  Administered 2017-03-06 (×6): 25 ug via INTRAVENOUS

## 2017-03-06 MED ORDER — OXYCODONE HCL 5 MG/5ML PO SOLN: 5.0000 mg | Freq: Once | ORAL | Status: DC | PRN

## 2017-03-06 MED ORDER — OXYCODONE HCL 5 MG PO TABS: 5.0000 mg | ORAL_TABLET | Freq: Once | ORAL | Status: DC | PRN

## 2017-03-06 MED ORDER — PHENYLEPHRINE HCL 10 MG/ML IJ SOLN
INTRAMUSCULAR | Status: AC
  Filled 2017-03-06: qty 1

## 2017-03-06 MED ORDER — MAGNESIUM HYDROXIDE 400 MG/5ML PO SUSP: 30.0000 mL | Freq: Every day | ORAL | Status: DC | PRN

## 2017-03-06 MED ORDER — QUETIAPINE FUMARATE 25 MG PO TABS
25.0000 mg | ORAL_TABLET | Freq: Every day | ORAL | Status: DC
  Administered 2017-03-06: 25 mg via ORAL
  Filled 2017-03-06: qty 1

## 2017-03-06 MED ORDER — NEOMYCIN-POLYMYXIN B GU 40-200000 IR SOLN
Status: DC | PRN
  Administered 2017-03-06: 2 mL

## 2017-03-06 MED ORDER — METOCLOPRAMIDE HCL 5 MG/ML IJ SOLN: 5.0000 mg | Freq: Three times a day (TID) | INTRAMUSCULAR | Status: DC | PRN

## 2017-03-06 MED ORDER — TRANEXAMIC ACID 1000 MG/10ML IV SOLN
INTRAVENOUS | Status: AC
  Filled 2017-03-06: qty 10

## 2017-03-06 MED ORDER — ACETAMINOPHEN 650 MG RE SUPP: 650.0000 mg | Freq: Four times a day (QID) | RECTAL | Status: DC | PRN

## 2017-03-06 MED ORDER — FLEET ENEMA 7-19 GM/118ML RE ENEM: 1.0000 | ENEMA | Freq: Once | RECTAL | Status: DC | PRN

## 2017-03-06 MED ORDER — APIXABAN 5 MG PO TABS
5.0000 mg | ORAL_TABLET | Freq: Two times a day (BID) | ORAL | Status: DC
  Administered 2017-03-07: 5 mg via ORAL
  Filled 2017-03-06: qty 1

## 2017-03-06 MED ORDER — SUGAMMADEX SODIUM 200 MG/2ML IV SOLN
INTRAVENOUS | Status: AC
  Filled 2017-03-06: qty 2

## 2017-03-06 MED ORDER — FUROSEMIDE 20 MG PO TABS
20.0000 mg | ORAL_TABLET | Freq: Every day | ORAL | Status: DC
  Administered 2017-03-07: 20 mg via ORAL
  Filled 2017-03-06: qty 1

## 2017-03-06 MED ORDER — SUGAMMADEX SODIUM 200 MG/2ML IV SOLN
INTRAVENOUS | Status: DC | PRN
  Administered 2017-03-06: 100 mg via INTRAVENOUS

## 2017-03-06 MED ORDER — EPHEDRINE SULFATE 50 MG/ML IJ SOLN
INTRAMUSCULAR | Status: AC
  Filled 2017-03-06: qty 1

## 2017-03-06 MED ORDER — GLYCOPYRROLATE 0.2 MG/ML IJ SOLN
INTRAMUSCULAR | Status: AC
  Filled 2017-03-06: qty 1

## 2017-03-06 MED ORDER — ONDANSETRON HCL 4 MG/2ML IJ SOLN
INTRAMUSCULAR | Status: AC
  Filled 2017-03-06: qty 2

## 2017-03-06 MED ORDER — FENTANYL CITRATE (PF) 100 MCG/2ML IJ SOLN
INTRAMUSCULAR | Status: DC | PRN
  Administered 2017-03-06 (×2): 50 ug via INTRAVENOUS
  Administered 2017-03-06 (×2): 25 ug via INTRAVENOUS
  Administered 2017-03-06 (×2): 50 ug via INTRAVENOUS

## 2017-03-06 MED ORDER — BUPIVACAINE-EPINEPHRINE (PF) 0.5% -1:200000 IJ SOLN
INTRAMUSCULAR | Status: DC | PRN
  Administered 2017-03-06: 30 mL via PERINEURAL

## 2017-03-06 MED ORDER — SODIUM CHLORIDE 0.9 % IV SOLN
INTRAVENOUS | Status: DC | PRN
  Administered 2017-03-06: 60 mL

## 2017-03-06 MED ORDER — FENTANYL CITRATE (PF) 250 MCG/5ML IJ SOLN
INTRAMUSCULAR | Status: AC
Start: 2017-03-06 — End: ?
  Filled 2017-03-06: qty 5

## 2017-03-06 MED ORDER — CEFAZOLIN SODIUM-DEXTROSE 2-4 GM/100ML-% IV SOLN
INTRAVENOUS | Status: AC
  Filled 2017-03-06: qty 100

## 2017-03-06 MED ORDER — DEXAMETHASONE SODIUM PHOSPHATE 10 MG/ML IJ SOLN
INTRAMUSCULAR | Status: AC
  Filled 2017-03-06: qty 1

## 2017-03-06 MED ORDER — METOPROLOL TARTRATE 50 MG PO TABS
50.0000 mg | ORAL_TABLET | Freq: Two times a day (BID) | ORAL | Status: DC
  Administered 2017-03-06: 50 mg via ORAL
  Filled 2017-03-06 (×2): qty 1

## 2017-03-06 MED ORDER — LIDOCAINE HCL (PF) 2 % IJ SOLN
INTRAMUSCULAR | Status: AC
  Filled 2017-03-06: qty 4

## 2017-03-06 MED ORDER — KETOROLAC TROMETHAMINE 30 MG/ML IJ SOLN: 15.0000 mg | Freq: Once | INTRAMUSCULAR | Status: DC

## 2017-03-06 MED ORDER — FAMOTIDINE 20 MG PO TABS
20.0000 mg | ORAL_TABLET | Freq: Once | ORAL | Status: AC
  Administered 2017-03-06: 20 mg via ORAL

## 2017-03-06 MED ORDER — ONDANSETRON HCL 4 MG/2ML IJ SOLN
INTRAMUSCULAR | Status: DC | PRN
  Administered 2017-03-06: 4 mg via INTRAVENOUS

## 2017-03-06 MED ORDER — KETOROLAC TROMETHAMINE 30 MG/ML IJ SOLN
INTRAMUSCULAR | Status: AC
Start: 2017-03-06 — End: ?
  Filled 2017-03-06: qty 1

## 2017-03-06 MED ORDER — KETOROLAC TROMETHAMINE 30 MG/ML IJ SOLN
INTRAMUSCULAR | Status: DC | PRN
  Administered 2017-03-06: 15 mg via INTRAVENOUS

## 2017-03-06 MED ORDER — DIPHENHYDRAMINE HCL 12.5 MG/5ML PO ELIX: 12.5000 mg | ORAL_SOLUTION | ORAL | Status: DC | PRN

## 2017-03-06 MED ORDER — FAMOTIDINE 20 MG PO TABS
ORAL_TABLET | ORAL | Status: AC
  Administered 2017-03-06: 20 mg via ORAL
  Filled 2017-03-06: qty 1

## 2017-03-06 MED ORDER — ONDANSETRON HCL 4 MG PO TABS: 4.0000 mg | ORAL_TABLET | Freq: Four times a day (QID) | ORAL | Status: DC | PRN

## 2017-03-06 MED ORDER — DEXAMETHASONE SODIUM PHOSPHATE 10 MG/ML IJ SOLN
INTRAMUSCULAR | Status: DC | PRN
  Administered 2017-03-06: 8 mg via INTRAVENOUS

## 2017-03-06 MED ORDER — MIDAZOLAM HCL 2 MG/2ML IJ SOLN
INTRAMUSCULAR | Status: AC
  Filled 2017-03-06: qty 2

## 2017-03-06 MED ORDER — METOCLOPRAMIDE HCL 10 MG PO TABS: 5.0000 mg | ORAL_TABLET | Freq: Three times a day (TID) | ORAL | Status: DC | PRN

## 2017-03-06 MED ORDER — SODIUM CHLORIDE 0.9 % IJ SOLN
INTRAMUSCULAR | Status: DC | PRN
  Administered 2017-03-06: 40 mL via INTRAVENOUS

## 2017-03-06 MED ORDER — SODIUM CHLORIDE 0.9 % IJ SOLN
INTRAMUSCULAR | Status: AC
  Filled 2017-03-06: qty 50

## 2017-03-06 MED ORDER — ACETAMINOPHEN 10 MG/ML IV SOLN
INTRAVENOUS | Status: DC | PRN
  Administered 2017-03-06: 1000 mg via INTRAVENOUS

## 2017-03-06 MED ORDER — DIAZEPAM 5 MG PO TABS
5.0000 mg | ORAL_TABLET | Freq: Three times a day (TID) | ORAL | Status: DC | PRN
  Administered 2017-03-07: 5 mg via ORAL
  Filled 2017-03-06: qty 1

## 2017-03-06 MED ORDER — MAGNESIUM OXIDE 400 (241.3 MG) MG PO TABS
400.0000 mg | ORAL_TABLET | Freq: Every day | ORAL | Status: DC
  Administered 2017-03-07: 400 mg via ORAL
  Filled 2017-03-06: qty 1

## 2017-03-06 MED ORDER — DOCUSATE SODIUM 100 MG PO CAPS
100.0000 mg | ORAL_CAPSULE | Freq: Two times a day (BID) | ORAL | Status: DC
  Administered 2017-03-06 – 2017-03-07 (×2): 100 mg via ORAL
  Filled 2017-03-06 (×2): qty 1

## 2017-03-06 MED ORDER — ADULT MULTIVITAMIN W/MINERALS CH
1.0000 | ORAL_TABLET | Freq: Every day | ORAL | Status: DC
  Administered 2017-03-07: 1 via ORAL
  Filled 2017-03-06: qty 1

## 2017-03-06 MED ORDER — ALBUTEROL SULFATE (2.5 MG/3ML) 0.083% IN NEBU
2.5000 mg | INHALATION_SOLUTION | Freq: Three times a day (TID) | RESPIRATORY_TRACT | Status: DC
  Administered 2017-03-06 – 2017-03-07 (×3): 2.5 mg via RESPIRATORY_TRACT
  Filled 2017-03-06 (×3): qty 3

## 2017-03-06 MED ORDER — MIDAZOLAM HCL 2 MG/2ML IJ SOLN
INTRAMUSCULAR | Status: DC | PRN
  Administered 2017-03-06: 2 mg via INTRAVENOUS

## 2017-03-06 SURGICAL SUPPLY — 60 items
BIT DRILL TWIST 2.7 (BIT) ×2 IMPLANT
BLADE SAGITTAL WIDE XTHICK NO (BLADE) ×2 IMPLANT
CANISTER SUCT 1200ML W/VALVE (MISCELLANEOUS) ×2 IMPLANT
CANISTER SUCT 3000ML PPV (MISCELLANEOUS) ×4 IMPLANT
CAPT SHLDR REVTOTAL 2 ×2 IMPLANT
CATH TRAY METER 16FR LF (MISCELLANEOUS) ×2 IMPLANT
CHLORAPREP W/TINT 26ML (MISCELLANEOUS) ×2 IMPLANT
COOLER POLAR GLACIER W/PUMP (MISCELLANEOUS) ×2 IMPLANT
CRADLE LAMINECT ARM (MISCELLANEOUS) ×2 IMPLANT
DRAPE IMP U-DRAPE 54X76 (DRAPES) ×4 IMPLANT
DRAPE INCISE IOBAN 66X45 STRL (DRAPES) ×4 IMPLANT
DRAPE INCISE IOBAN 66X60 STRL (DRAPES) ×2 IMPLANT
DRAPE SHEET LG 3/4 BI-LAMINATE (DRAPES) ×4 IMPLANT
DRAPE TABLE BACK 80X90 (DRAPES) ×2 IMPLANT
DRSG OPSITE POSTOP 4X8 (GAUZE/BANDAGES/DRESSINGS) ×2 IMPLANT
ELECT CAUTERY BLADE 6.4 (BLADE) ×2 IMPLANT
GLOVE BIO SURGEON STRL SZ7.5 (GLOVE) ×8 IMPLANT
GLOVE BIO SURGEON STRL SZ8 (GLOVE) ×8 IMPLANT
GLOVE BIOGEL PI IND STRL 8 (GLOVE) ×1 IMPLANT
GLOVE BIOGEL PI INDICATOR 8 (GLOVE) ×1
GLOVE INDICATOR 8.0 STRL GRN (GLOVE) ×2 IMPLANT
GOWN STRL REUS W/ TWL LRG LVL3 (GOWN DISPOSABLE) ×1 IMPLANT
GOWN STRL REUS W/ TWL XL LVL3 (GOWN DISPOSABLE) ×1 IMPLANT
GOWN STRL REUS W/TWL LRG LVL3 (GOWN DISPOSABLE) ×1
GOWN STRL REUS W/TWL XL LVL3 (GOWN DISPOSABLE) ×1
HOOD PEEL AWAY FLYTE STAYCOOL (MISCELLANEOUS) ×6 IMPLANT
KIT RM TURNOVER STRD PROC AR (KITS) ×2 IMPLANT
KIT STABILIZATION SHOULDER (MISCELLANEOUS) ×2 IMPLANT
MASK FACE SPIDER DISP (MASK) ×2 IMPLANT
NDL MAYO CATGUT SZ1 (NEEDLE)
NDL MAYO CATGUT SZ5 (NEEDLE)
NDL SAFETY 22GX1.5 (NEEDLE) ×2 IMPLANT
NDL SUT 5 .5 CRC TPR PNT MAYO (NEEDLE) IMPLANT
NEEDLE 18GX1X1/2 (RX/OR ONLY) (NEEDLE) ×2 IMPLANT
NEEDLE HYPO 25X1 1.5 SAFETY (NEEDLE) ×2 IMPLANT
NEEDLE MAYO CATGUT SZ1 (NEEDLE) IMPLANT
NEEDLE MAYO CATGUT SZ4 (NEEDLE) IMPLANT
NEEDLE SPNL 20GX3.5 QUINCKE YW (NEEDLE) ×2 IMPLANT
NS IRRIG 500ML POUR BTL (IV SOLUTION) ×2 IMPLANT
PACK ARTHROSCOPY SHOULDER (MISCELLANEOUS) ×2 IMPLANT
PAD WRAPON POLAR SHDR UNIV (MISCELLANEOUS) ×1 IMPLANT
PIN THREADED REVERSE (PIN) ×2 IMPLANT
PULSAVAC PLUS IRRIG FAN TIP (DISPOSABLE) ×2
SLING ULTRA II M (MISCELLANEOUS) ×2 IMPLANT
SOL .9 NS 3000ML IRR  AL (IV SOLUTION) ×1
SOL .9 NS 3000ML IRR UROMATIC (IV SOLUTION) ×1 IMPLANT
SPONGE LAP 18X18 5 PK (GAUZE/BANDAGES/DRESSINGS) ×2 IMPLANT
STAPLER SKIN PROX 35W (STAPLE) ×2 IMPLANT
STEM HUMERAL STRL 9MMX83MM (Stem) ×2 IMPLANT
SUT ETHIBOND 0 MO6 C/R (SUTURE) ×2 IMPLANT
SUT FIBERWIRE #2 38 BLUE 1/2 (SUTURE) ×8
SUT VIC AB 0 CT1 36 (SUTURE) ×4 IMPLANT
SUT VIC AB 2-0 CT1 27 (SUTURE) ×3
SUT VIC AB 2-0 CT1 TAPERPNT 27 (SUTURE) ×3 IMPLANT
SUTURE FIBERWR #2 38 BLUE 1/2 (SUTURE) ×4 IMPLANT
SYR 30ML LL (SYRINGE) ×4 IMPLANT
SYRINGE 10CC LL (SYRINGE) ×2 IMPLANT
TIP FAN IRRIG PULSAVAC PLUS (DISPOSABLE) ×1 IMPLANT
TUBE CONNECTING 6X3/16 (MISCELLANEOUS) ×2 IMPLANT
WRAPON POLAR PAD SHDR UNIV (MISCELLANEOUS) ×2

## 2017-03-06 NOTE — Op Note (Signed)
03/06/2017  10:52 AM  Patient:   Virginia [name]  Pre-Op Diagnosis:   Massive irreparable rotator cuff tear with cuff arthropathy, right shoulder.  Post-Op Diagnosis:   Same.  Procedure:   Reverse right total shoulder arthroplasty.  Surgeon:   Maryagnes Amos, MD  Assistant:   Horris Latino, PA-C  Anesthesia:   General endotracheal with an interscalene block placed preoperatively by the anesthesiologist.  Findings:   As above.  Complications:   None  EBL:  150 cc  Fluids:   500 cc crystalloid  UOP:   None  TT:   None  Drains:   None  Closure:   Staples  Implants:   All press-fit [employer] Comprehensive system with a #8 mini-humeral stem, a 44 mm humeral tray with a standard insert, and a mini-base plate with a 36 mm glenosphere.  Brief Clinical Note:   The patient is a 66 year old female with a long history of right shoulder pain and weakness. Her symptoms have persisted despite medications, activity modification, therapy, etc. She has undergone for attempted rotator cuff repairs. Her history and examination are consistent with a massive rotator cuff tear with chronic cuff arthropathy, all of which were confirmed by MRI scan. The patient presents at this time for a reverse right total shoulder arthroplasty.  Procedure:   The patient was brought into the operating room and lain in the supine position. The patient then underwent general endotracheal intubation and anesthesia before she was repositioned in the beach chair position using the beach chair positioner. The right shoulder and upper extremity were prepped with ChloraPrep solution before being draped sterilely. Preoperative antibiotics were administered. A standard anterior approach to the shoulder was made through an approximately 4-5 inch incision. The incision was carried down through the subcutaneous tissues to expose the deltopectoral fascia. The interval between the deltoid and pectoralis muscles was identified and this  plane developed, retracting the cephalic vein laterally with the deltoid muscle. The conjoined tendon was identified. Its lateral margin was dissected and the Kolbel self-retraining retractor inserted. The "three sisters" were identified and cauterized. Bursal tissues were removed to improve visualization. The subscapularis tendon was released from its attachment to the lesser tuberosity 1 cm proximal to its insertion and several tagging sutures placed. The inferior capsule was released with care after identifying and protecting the axillary nerve. The proximal humeral cut was made at approximately 20 of retroversion using the extra-medullary guide.   Attention was redirected to the glenoid. The labrum was debrided circumferentially before the center of the glenoid was marked with electrocautery. The guidewire was drilled into the glenoid neck using the appropriate guide. After verifying its position, it was overreamed with the mini-baseplate reamer to create a flat surface. The permanent mini-baseplate was impacted into place. It was stabilized with a 25 x 6.5 mm central screw and four peripheral screws. Locking screws were placed superiorly, inferiorly, and anteriorly. No posterior screw was placed as the posterior glenoid rim was quite thin. The permanent 36 mm glenosphere was then impacted into place and its Morse taper locking mechanism verified using manual distraction.  Attention was directed to the humeral side. The humeral canal was reamed sequentially beginning with the end-cutting reamer, then progressing from a 4 mm reamer up to a 9 mm reamer. This provided excellent circumferential chatter. The canal was broached beginning with a #6 broach and progressing to a #9 broach. This was left in place and a trial reduction performed using the standard trial humeral platform. The  arm demonstrated excellent range of motion as the hand could be brought across the chest to the opposite shoulder and brought to  the top of the patient's head and to the patient's ear. The shoulder appeared stable throughout this range of motion. The joint was dislocated and the trial components removed. The permanent #9 mini-stem was impacted into place with care taken to maintain the appropriate version. The permanent 44 mm humeral platform with the standard insert was put together on the back table and impacted into place. Again, the [facility] taper locking mechanism was verified using manual distraction. An attempt was made to relocate the shoulder, but the construct was too tight. Therefore, it was removed. The proximal humerus was recut, removing additional 4-5 mm. The canal was reamed broached and it was elected to proceed with a #8 mini stem. The permanent #8 mini stem was impacted into place with care taken to maintain the appropriate version. The permanent 44 mm humeral platform with the standard insert was impacted into place. Again, the [facility] taper locking mechanism was verified using manual distraction. The shoulder was relocated using two finger pressure and again placed through a range of motion with the findings as described above.  The wound was copiously irrigated with bacitracin saline solution using the jet lavage system before a total of 20 cc of Exparel diluted out to 60 cc with normal saline and 30 cc of 0.5% Sensorcaine with epinephrine was injected into the pericapsular and peri-incisional tissues to help with postoperative analgesia. The subscapularis tendon was reapproximated using #2 FiberWire interrupted sutures. The deltopectoral interval was closed using #0 Vicryl interrupted sutures before the subcutaneous tissues were closed using 2-0 Vicryl interrupted sutures. The skin was closed using staples. Prior to closing the skin, 1 g of transexemic acid in 10 cc of normal saline was injected intra-articularly to help with postoperative bleeding. A sterile occlusive dressing was applied to the wound before the arm was  placed into a shoulder immobilizer with an abduction pillow. A Polar Care system also was applied to the shoulder. The patient was then transferred back to a hospital bed before being awakened, extubated, and returned to the recovery room in satisfactory condition after tolerating the procedure well.

## 2017-03-06 NOTE — Anesthesia Postprocedure Evaluation (Signed)
Anesthesia Post Note  Patient: Virginia CraftSandra Pait  Procedure(s) Performed: Procedure(s) (LRB): REVERSE SHOULDER ARTHROPLASTY (Right)  Patient location during evaluation: PACU Anesthesia Type: General Level of consciousness: awake and alert Pain management: pain level controlled Vital Signs Assessment: post-procedure vital signs reviewed and stable Respiratory status: spontaneous breathing, nonlabored ventilation, respiratory function stable and patient connected to nasal cannula oxygen Cardiovascular status: blood pressure returned to baseline and stable Postop Assessment: no apparent nausea or vomiting Anesthetic complications: no     Last Vitals:  Vitals:   03/06/17 1215 03/06/17 1230  BP: (!) 143/79 125/79  Pulse: (!) 56 65  Resp: 15 16  Temp: 36.6 C 36.8 C  SpO2: 100% 100%    Last Pain:  Vitals:   03/06/17 1230  TempSrc: Oral  PainSc: 4                  Jomarie LongsJoseph K Piscitello

## 2017-03-06 NOTE — Anesthesia Procedure Notes (Signed)
Procedure Name: Intubation Date/Time: 03/06/2017 8:51 AM Performed by: Darlyne Russian Pre-anesthesia Checklist: Patient identified, Emergency Drugs available, Suction available, Patient being monitored and Timeout performed Patient Re-evaluated:Patient Re-evaluated prior to induction Oxygen Delivery Method: Circle system utilized Preoxygenation: Pre-oxygenation with 100% oxygen Induction Type: IV induction Ventilation: Mask ventilation without difficulty Laryngoscope Size: Mac and 3 Grade View: Grade I Tube type: Oral Tube size: 6.5 mm Number of attempts: 1 Airway Equipment and Method: Stylet Placement Confirmation: ETT inserted through vocal cords under direct vision,  positive ETCO2 and breath sounds checked- equal and bilateral Secured at: 20 cm Tube secured with: Tape Dental Injury: Teeth and Oropharynx as per pre-operative assessment

## 2017-03-06 NOTE — [place] FL2 (Addendum)
[place] MEDICAID FL2 LEVEL OF CARE SCREENING TOOL     IDENTIFICATION  Patient Name: Virginia CraftSandra [name] Birthdate: May 01, 1951 Sex: female Admission Date (Current Location): 03/06/2017  [facility] [place]:  Virginia [name]  ([id number]) Facility and Address:  [facility], [address], [place], [place] [address]      Provider Number: [id number]  Attending Physician Name and Address:  Christena FlakePoggi, Virginia J, MD  Relative Name and Phone Number:       Current Level of Care: Hospital Recommended Level of Care: Skilled Nursing Facility Prior Approval Number:    Date Approved/Denied:   PASRR Number:   [id number]  Discharge Plan: SNF    Current Diagnoses: Patient Active Problem List   Diagnosis Date Noted  . 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 A-fib (HCC) 11/09/2016    Orientation RESPIRATION BLADDER Height & Weight     Self, Time, Situation, Place  O2 (Nasal Cannula 3L) Continent Weight: 111 lb (50.3 kg) Height:  5' (152.4 cm)  BEHAVIORAL SYMPTOMS/MOOD NEUROLOGICAL BOWEL NUTRITION STATUS      Continent Diet (Heart Healthy/Carb Modified; Fluid Consistency (Thin))  AMBULATORY STATUS COMMUNICATION OF NEEDS Skin   Extensive Assist Verbally Surgical wounds (Right Shoulder)                       Personal Care Assistance Level of Assistance  Bathing, Feeding, Dressing Bathing Assistance: Limited assistance Feeding assistance: Independent Dressing Assistance: Limited assistance     Functional Limitations Info  Sight, Hearing, Speech Sight Info: Adequate Hearing Info: Adequate Speech Info: Adequate    SPECIAL CARE FACTORS FREQUENCY  PT (By licensed PT), OT (By licensed OT)     PT Frequency:  (5) OT Frequency:  (5)            Contractures      Additional Factors Info  Code Status, Allergies Code Status Info:  (Full Code) Allergies Info:  (None)           Current Medications (03/06/2017):  This is the  current hospital active medication list Current Facility-Administered Medications  Medication Dose Route Frequency Provider Last Rate Last Dose  . acetaminophen (TYLENOL) tablet 650 mg  650 mg Oral Q6H PRN [name], Excell SeltzerJohn J, MD       Or  . acetaminophen (TYLENOL) suppository 650 mg  650 mg Rectal Q6H PRN [name], Excell SeltzerJohn J, MD      . acetaminophen (TYLENOL) tablet 1,000 mg  1,000 mg Oral Q6H [name], Excell SeltzerJohn J, MD   1,000 mg at 03/06/17 1315  . albuterol (PROVENTIL) (2.5 MG/3ML) 0.083% nebulizer solution 2.5 mg  2.5 mg Nebulization Q8H [name], Virginia J, MD      . albuterol (PROVENTIL) (2.5 MG/3ML) 0.083% nebulizer solution 3 mL  3 mL Inhalation Q6H PRN [name], Excell SeltzerJohn J, MD      . Virginia [name][START ON 03/07/2017] apixaban (ELIQUIS) tablet 5 mg  5 mg Oral BID [name], Excell SeltzerJohn J, MD      . bisacodyl (DULCOLAX) suppository 10 mg  10 mg Rectal Daily PRN [name], Excell SeltzerJohn J, MD      . ceFAZolin (ANCEF) IVPB 2g/100 mL premix  2 g Intravenous Q6H [name], Excell SeltzerJohn J, MD 200 mL/hr at 03/06/17 1316 2 g at 03/06/17 1316  . dextrose 5 % and 0.9 % NaCl with KCl 20 mEq/L infusion   Intravenous Continuous [name], Excell SeltzerJohn J, MD 50 mL/hr at 03/06/17 1316    . diazepam (VALIUM) tablet 5 mg  5 mg  Oral Q8H PRN [name], Excell Seltzer, MD      . diphenhydrAMINE (BENADRYL) 12.5 MG/5ML elixir 12.5-25 mg  12.5-25 mg Oral Q4H PRN [name], Excell Seltzer, MD      . docusate sodium (COLACE) capsule 100 mg  100 mg Oral BID [name], Excell Seltzer, MD      . estradiol (ESTRACE) tablet 1 mg  1 mg Oral Daily [name], Excell Seltzer, MD      . Virginia [name] ON 03/07/2017] furosemide (LASIX) tablet 20 mg  20 mg Oral Daily [name], Excell Seltzer, MD      . HYDROmorphone (DILAUDID) injection 0.5-1 mg  0.5-1 mg Intravenous Q2H PRN [name], Excell Seltzer, MD      . Virginia [name] ON 03/07/2017] Influenza vac split quadrivalent PF (FLUZONE HIGH-DOSE) injection 0.5 mL  0.5 mL Intramuscular Tomorrow-1000 [name], Excell Seltzer, MD      . ketorolac (TORADOL) 15 MG/ML injection 7.5 mg  7.5 mg Intravenous Q6H [name], Excell Seltzer, MD   7.5 mg at 03/06/17 1315  . magnesium  hydroxide (MILK OF MAGNESIA) suspension 30 mL  30 mL Oral Daily PRN [name], Excell Seltzer, MD      . Virginia [name] ON 03/07/2017] magnesium oxide (MAG-OX) tablet 400 mg  400 mg Oral Daily [name], Excell Seltzer, MD      . metoCLOPramide (REGLAN) tablet 5-10 mg  5-10 mg Oral Q8H PRN [name], Excell Seltzer, MD       Or  . metoCLOPramide (REGLAN) injection 5-10 mg  5-10 mg Intravenous Q8H PRN [name], Excell Seltzer, MD      . metoprolol tartrate (LOPRESSOR) tablet 50 mg  50 mg Oral BID [name], Excell Seltzer, MD      . mometasone-formoterol (DULERA) 100-5 MCG/ACT inhaler 2 puff  2 puff Inhalation BID [name], Excell Seltzer, MD      . Virginia [name] ON 03/07/2017] multivitamin with minerals tablet 1 tablet  1 tablet Oral Daily [name], Excell Seltzer, MD      . ondansetron (ZOFRAN) tablet 4 mg  4 mg Oral Q6H PRN [name], Excell Seltzer, MD       Or  . ondansetron (ZOFRAN) injection 4 mg  4 mg Intravenous Q6H PRN [name], Excell Seltzer, MD      . oxyCODONE (Oxy IR/ROXICODONE) immediate release tablet 5-10 mg  5-10 mg Oral Q3H PRN [name], Excell Seltzer, MD   5 mg at 03/06/17 1315  . QUEtiapine (SEROQUEL) tablet 25 mg  25 mg Oral QHS [name], Excell Seltzer, MD      . sodium phosphate (FLEET) 7-19 GM/118ML enema 1 enema  1 enema Rectal Once PRN [name], Excell Seltzer, MD      . Virginia [name] ON 03/07/2017] umeclidinium bromide (INCRUSE ELLIPTA) 62.5 MCG/INH 1 puff  1 puff Inhalation Daily [name], Excell Seltzer, MD         Discharge Medications: Please see discharge summary for a list of discharge medications.  Relevant Imaging Results:  Relevant Lab Results:   Additional Information  (SSN: [id number])  Virginia [name], Student-Social Work

## 2017-03-06 NOTE — Evaluation (Signed)
Physical Therapy Evaluation Patient Details Name: Virginia [name] MRN: [id number] DOB: April 22, 1951 Today's Date: 03/06/2017   History of Present Illness  66 y/o female s/p R reverse total shoulder replacement 9/13.  Clinical Impression  Pt did well with ambulation and bed mobility and showed safety and relative confidence with this.  She is able towalk ~100 ft w/o LOBs and overall did well.  She states she has been pepping for and practicing doing things L handed.  She also has her son and daughter-in-law that live close and will be able to help as much as needed. Pt did well, should be able to return home safely once medically cleared.    Follow Up Recommendations DC plan and follow up therapy as arranged by surgeon    Equipment Recommendations  None recommended by PT    Recommendations for Other Services       Precautions / Restrictions Precautions Precautions: Shoulder Type of Shoulder Precautions: rev total shoulder Shoulder Interventions: Shoulder sling/immobilizer Restrictions Weight Bearing Restrictions: Yes RUE Weight Bearing: Non weight bearing      Mobility  Bed Mobility Overal bed mobility: Independent             General bed mobility comments: Pt was able to use momentum and just L hand to get in/out of bed w/o assist  Transfers Overall transfer level: Independent Equipment used: None             General transfer comment: Pt able to rise to standing w/o assist, showed good safety and confidence  Ambulation/Gait Ambulation/Gait assistance: Supervision Ambulation Distance (Feet): 100 Feet Assistive device: None       General Gait Details: Pt with slight baseline limp (R LE shorter) but walked with good speed and confidence.  She did have some lightheadedness and need to go to the bathroom so we did turn around and return to room.  Pt did well and was safe with the effort.   Stairs            Wheelchair Mobility    Modified Rankin (Stroke  Patients Only)       Balance                                             Pertinent Vitals/Pain Pain Assessment: 0-10 Pain Score: 6  Pain Location: R shoulder    Home Living Family/patient expects to be discharged to:: Private residence Living Arrangements: Alone Available Help at Discharge: Family Type of Home: Apartment Home Access: Level entry     Home Layout: One level Home Equipment: None      Prior Function Level of Independence: Independent         Comments: Pt reports that she is normally able to do all she needs, she has been practicing doing things around the home with only her L hand     Hand Dominance   Dominant Hand: Right    Extremity/Trunk Assessment   Upper Extremity Assessment Upper Extremity Assessment: Overall WFL for tasks assessed (R not tested, in sling)    Lower Extremity Assessment Lower Extremity Assessment: Overall WFL for tasks assessed (R LE with increased sensitivity, grossly 4-/5)       Communication   Communication: No difficulties  Cognition Arousal/Alertness: Awake/alert Behavior During Therapy: WFL for tasks assessed/performed Overall Cognitive Status: Within Functional Limits for tasks assessed  General Comments      Exercises     Assessment/Plan    PT Assessment Patient needs continued PT services  PT Problem List Decreased strength;Decreased activity tolerance;Decreased mobility;Decreased safety awareness;Decreased knowledge of precautions;Pain       PT Treatment Interventions Gait training;Functional mobility training;Therapeutic activities;Therapeutic exercise;Balance training;Patient/family education    PT Goals (Current goals can be found in the Care Plan section)  Acute Rehab PT Goals Patient Stated Goal: get back to her normal PT Goal Formulation: With patient/family Time For Goal Achievement: 03/20/17 Potential to Achieve  Goals: Good    Frequency BID   Barriers to discharge        Co-evaluation               AM-PAC PT "6 Clicks" Daily Activity  Outcome Measure Difficulty turning over in bed (including adjusting bedclothes, sheets and blankets)?: A Little Difficulty moving from lying on back to sitting on the side of the bed? : A Little Difficulty sitting down on and standing up from a chair with arms (e.g., wheelchair, bedside commode, etc,.)?: None Help needed moving to and from a bed to chair (including a wheelchair)?: None Help needed walking in hospital room?: None Help needed climbing 3-5 steps with a railing? : None 6 Click Score: 22    End of Session Equipment Utilized During Treatment: Gait belt Activity Tolerance: Patient tolerated treatment well Patient left: with bed alarm set;with call bell/phone within reach   PT Visit Diagnosis: Muscle weakness (generalized) (M62.81);Pain Pain - Right/Left: Right Pain - part of body: Shoulder    Time: [id number] PT Time Calculation (min) (ACUTE ONLY): 21 min   Charges:   PT Evaluation $PT Eval Low Complexity: 1 Low     PT G Codes:   PT G-Codes **NOT FOR INPATIENT CLASS** Functional Assessment Tool Used: AM-PAC 6 Clicks Basic Mobility Functional Limitation: Mobility: Walking and moving around Mobility: Walking and Moving Around Current Status (V4098(G8978): At least 20 percent but less than 40 percent impaired, limited or restricted Mobility: Walking and Moving Around Goal Status [phone](G8979): At least 1 percent but less than 20 percent impaired, limited or restricted    Malachi ProGalen R Rayanne Padmanabhan, DPT 03/06/2017, 4:32 PM

## 2017-03-06 NOTE — Transfer of Care (Signed)
Immediate Anesthesia Transfer of Care Note  [name]  Procedure(s) Performed: Procedure(s): REVERSE SHOULDER ARTHROPLASTY (Right)  Patient Location: PACU  Anesthesia Type:General  Level of Consciousness: awake, alert , oriented and patient cooperative  Airway & Oxygen Therapy: Patient Spontanous Breathing and Patient connected to nasal cannula oxygen  Post-op Assessment: Report given to RN and Post -op Vital signs reviewed and stable  Post vital signs: Reviewed and stable  Last Vitals:  Vitals:   03/06/17 0733 03/06/17 1120  BP: (!) 158/99 (!) 175/99  Pulse: 72 64  Resp: 20 10  Temp: 36.6 C (!) 36.1 C  SpO2: 100% 100%    Last Pain:  Vitals:   03/06/17 1120  TempSrc: Temporal  PainSc: 6       Patients Stated Pain Goal: 2 (03/06/17 0733)  Complications: No apparent anesthesia complications

## 2017-03-06 NOTE — Anesthesia Preprocedure Evaluation (Signed)
Anesthesia Evaluation  Patient identified by MRN, date of birth, ID band Patient awake    Reviewed: Allergy & Precautions, H&P , NPO status , Patient's Chart, lab work & pertinent test results  History of Anesthesia Complications Negative for: history of anesthetic complications  Airway Mallampati: III  TM Distance: <3 FB Neck ROM: limited    Dental  (+) Poor Dentition, Chipped, Missing, Upper Dentures, Partial Lower   Pulmonary shortness of breath, with exertion and at rest, pneumonia, COPD,  COPD inhaler and oxygen dependent, former smoker,           Cardiovascular Exercise Tolerance: Poor hypertension, (-) angina(-) Past MI + dysrhythmias Atrial Fibrillation      Neuro/Psych PSYCHIATRIC DISORDERS Anxiety Depression  Neuromuscular disease    GI/Hepatic negative GI ROS, (+) Hepatitis -, C  Endo/Other  negative endocrine ROS  Renal/GU      Musculoskeletal  (+) Arthritis , Fibromyalgia -  Abdominal   Peds  Hematology negative hematology ROS (+)   Anesthesia Other Findings Patient has cardiac and pulmonary clearance for this procedure.    Past Medical History: No date: Anemia No date: Anxiety No date: Arthritis No date: Atrial fibrillation (HCC) 09/2015: C. difficile colitis No date: COPD (chronic obstructive pulmonary disease) (HCC) No date: Depression No date: Dyspnea No date: Dysrhythmia No date: Fibromyalgia No date: H/O tracheostomy No date: Hep C w/ coma, chronic (HCC) No date: Hypertension 2017: MRSA pneumonia (HCC) No date: On home oxygen therapy     Comment:  3 L/M  No date: Osteoporosis No date: Peripheral neuropathy No date: RLS (restless legs syndrome) 09/2015: S/P percutaneous endoscopic gastrostomy (PEG) tube placement  (HCC) 10/2015: Ventilator associated pneumonia (HCC)     Comment[facility] [place]  Past Surgical History: No date: ABDOMINAL HYSTERECTOMY No date: BREAST  SURGERY; Bilateral     Comment:  Breast Implants No date: DILATION AND CURETTAGE OF UTERUS No date: ROTATOR CUFF REPAIR; Bilateral  BMI    Body Mass Index:  21.68 kg/m      Reproductive/Obstetrics negative OB ROS                             Anesthesia Physical Anesthesia Plan  ASA: IV  Anesthesia Plan: General ETT   Post-op Pain Management:    Induction: Intravenous  PONV Risk Score and Plan: 3 and Ondansetron, Dexamethasone, Midazolam and Treatment may vary due to age or medical condition  Airway Management Planned: Oral ETT  Additional Equipment:   Intra-op Plan:   Post-operative Plan: Extubation in OR and Possible Post-op intubation/ventilation  Informed Consent: I have reviewed the patients History and Physical, chart, labs and discussed the procedure including the risks, benefits and alternatives for the proposed anesthesia with the patient or authorized representative who has indicated his/her understanding and acceptance.   Dental Advisory Given  Plan Discussed with: Anesthesiologist, CRNA and Surgeon  Anesthesia Plan Comments: (Patient and family informed that patient is higher risk for complications from anesthesia during this procedure due to their medical history and age including but not limited to post operative cognitive dysfunction.  They voiced understanding.  Patient consented for risks of anesthesia including but not limited to:  - adverse reactions to medications - damage to teeth, lips or other oral mucosa - sore throat or hoarseness - Damage to heart, brain, lungs or loss of life  Patient voiced understanding.)        Anesthesia Quick Evaluation

## 2017-03-06 NOTE — Progress Notes (Signed)
Pt admitted to room 150 from PACU. Rept received from PACU RN. Pt has mobilizer sling to RUE. +CMS of RUE. Polar care intact. Pt denies need of pain med at this time. Vital signs stable. Pt alert and oriented x 3. No s/sx distress and no c/o such. IV infusing to L hand. Pt oriented to room and unit protocols. Bed alarm applied for safety. Pt verbalizes understanding of all teachings and agrees to comply.

## 2017-03-06 NOTE — Anesthesia Post-op Follow-up Note (Signed)
Anesthesia QCDR form completed.        

## 2017-03-06 NOTE — H&P (Signed)
Paper H&P to be scanned into permanent record. H&P reviewed and patient re-examined. No changes. 

## 2017-03-07 LAB — CBC WITH DIFFERENTIAL/PLATELET
BASOS PCT: 0 %
Basophils Absolute: 0 10*3/uL (ref 0–0.1)
EOS ABS: 0 10*3/uL (ref 0–0.7)
Eosinophils Relative: 0 %
HCT: 20.7 % — ABNORMAL LOW (ref 35.0–47.0)
HEMOGLOBIN: 7.1 g/dL — AB (ref 12.0–16.0)
Lymphocytes Relative: 11 %
Lymphs Abs: 0.9 10*3/uL — ABNORMAL LOW (ref 1.0–3.6)
MCH: 31.4 pg (ref 26.0–34.0)
MCHC: 34.2 g/dL (ref 32.0–36.0)
MCV: 91.7 fL (ref 80.0–100.0)
MONOS PCT: 11 %
Monocytes Absolute: 0.9 10*3/uL (ref 0.2–0.9)
NEUTROS PCT: 78 %
Neutro Abs: 6 10*3/uL (ref 1.4–6.5)
PLATELETS: 206 10*3/uL (ref 150–440)
RBC: 2.25 MIL/uL — ABNORMAL LOW (ref 3.80–5.20)
RDW: 14.2 % (ref 11.5–14.5)
WBC: 7.8 10*3/uL (ref 3.6–11.0)

## 2017-03-07 LAB — BASIC METABOLIC PANEL
Anion gap: 6 (ref 5–15)
BUN: 17 mg/dL (ref 6–20)
CALCIUM: 8.1 mg/dL — AB (ref 8.9–10.3)
CHLORIDE: 99 mmol/L — AB (ref 101–111)
CO2: 29 mmol/L (ref 22–32)
Creatinine, Ser: 1.18 mg/dL — ABNORMAL HIGH (ref 0.44–1.00)
GFR, EST AFRICAN AMERICAN: 54 mL/min — AB (ref >60)
GFR, EST NON AFRICAN AMERICAN: 47 mL/min — AB (ref >60)
Glucose, Bld: 116 mg/dL — ABNORMAL HIGH (ref 65–99)
Potassium: 3.5 mmol/L (ref 3.5–5.1)
SODIUM: 134 mmol/L — AB (ref 135–145)

## 2017-03-07 LAB — HEMOGLOBIN AND HEMATOCRIT, BLOOD
HEMATOCRIT: 26.2 % — AB (ref 35.0–47.0)
Hemoglobin: 9.3 g/dL — ABNORMAL LOW (ref 12.0–16.0)

## 2017-03-07 LAB — PREPARE RBC (CROSSMATCH)

## 2017-03-07 MED ORDER — SODIUM CHLORIDE 0.9 % IV SOLN
Freq: Once | INTRAVENOUS | Status: AC
  Administered 2017-03-07: 12:00:00 via INTRAVENOUS

## 2017-03-07 MED ORDER — TRAMADOL HCL 50 MG PO TABS: 50.0000 mg | ORAL_TABLET | Freq: Four times a day (QID) | ORAL | 0 refills | Status: DC | PRN

## 2017-03-07 MED ORDER — OXYCODONE HCL 5 MG PO TABS: 5.0000 mg | ORAL_TABLET | ORAL | 0 refills | Status: DC | PRN

## 2017-03-07 NOTE — Progress Notes (Addendum)
Chaplain responded to an OR for Advanced Directives that pt had requested. CH met with pt. Pt talked about her previous medical experience that has informed her desired to completed AD. [place] educated pt, pt was to review material and contact [facility] when ready to complete. Pt was having problem writing with her wright hand because of a surgery she had to fix her shoulder yesterday. [place] to follow up with pt as needed.   03/07/17 1100  Clinical Encounter Type  Visited With Patient;Health care provider  Visit Type Initial;Follow-up;Other (Comment)  Referral From Nurse  Consult/Referral To Chaplain  Spiritual Encounters  Spiritual Needs Literature

## 2017-03-07 NOTE — Progress Notes (Signed)
Clinical Child psychotherapist (CSW) received SNF consult. PT is recommending "DC plan and follow up therapy as arranged by surgeon." Patient will D/C home today. RN case manager aware of above. Please reconsult if future social work needs arise. CSW signing off.   [employer], LCSW [phone]

## 2017-03-07 NOTE — Progress Notes (Signed)
Discharge instructions and med details gone over with patient and son. Pt verbalizes understanding.  Printed prescriptions for tramadol and oxycodone given to pt. Printed AVS provided. IV removed. Patient escorted out via wheelchair.  Stephannie Peters, RN

## 2017-03-07 NOTE — Care Management Important Message (Signed)
Important Message  Patient Details  Name: Virginia [name] MRN: [id number] Date of Birth: 06-14-1951   Medicare Important Message Given:  N/A - LOS <3 / Initial given by admissions    Marily Memos, RN 03/07/2017, 8:40 AM

## 2017-03-07 NOTE — Care Management Note (Signed)
Case Management Note  Patient Details  Name: Virginia [name] MRN: [id number] Date of Birth: [birth year]  Subjective/Objective:  RNCM assessment for discharge planning. Spoke with D. Poggi and he would like patient to have home health. POD # 1 rught reverse shoulder arthroplasty. Met with patient at bedside. She lives at home alone but has help from her son and daughter in law. Uses no DME. Working prior to admission. Offered choice of home health and she prefers [facility]. Referral to [facility] for PT and OT. Discharging today. No further needs identified. Case closed.                     Action/Plan:   Expected Discharge Date:  03/07/17               Expected Discharge Plan:     In-House Referral:     Discharge planning Services  CM Consult  Post Acute Care Choice:  Home Health Choice offered to:  Patient  DME Arranged:    DME Agency:     HH Arranged:  PT, OT HH Agency:  [facility]  Status of Service:  Completed, signed off  If discussed at [place] of Stay Meetings, dates discussed:    Additional Comments:  Jolly Mango, RN 03/07/2017, 8:36 AM

## 2017-03-07 NOTE — Progress Notes (Addendum)
Subjective: 1 Day Post-Op Procedure(s) (LRB): REVERSE SHOULDER ARTHROPLASTY (Right) Patient reports pain as moderate.   Patient is well, and has had no acute complaints or problems Plan is to go Home after hospital stay. Negative for chest pain and shortness of breath Fever: no Gastrointestinal:Negative for nausea and vomiting  Objective: Vital signs in last 24 hours: Temp:  [96.9 F (36.1 C)-98.2 F (36.8 C)] 97.6 F (36.4 C) (09/14 0422) Pulse Rate:  [55-70] 55 (09/14 0422) Resp:  [10-34] 18 (09/14 0422) BP: (108-178)/(71-99) 114/80 (09/14 0422) SpO2:  [95 %-100 %] 95 % (09/14 0422) Weight:  [50.3 kg (111 lb)] 50.3 kg (111 lb) (09/13 1230)  Intake/Output from previous day:  Intake/Output Summary (Last 24 hours) at 03/07/17 0734 Last data filed at 03/06/17 1900  Gross per 24 hour  Intake              950 ml  Output              150 ml  Net              800 ml    Intake/Output this shift: No intake/output data recorded.  Labs:  Recent Labs  03/07/17 0457  HGB 7.1*    Recent Labs  03/07/17 0457  WBC 7.8  RBC 2.25*  HCT 20.7*  PLT 206    Recent Labs  03/07/17 0457  NA 134*  K 3.5  CL 99*  CO2 29  BUN 17  CREATININE 1.18*  GLUCOSE 116*  CALCIUM 8.1*   No results for input(s): LABPT, INR in the last 72 hours.   EXAM General - Patient is Alert, Appropriate and Oriented.  Patient does appear pale this AM. Extremity - ABD soft Incision: scant drainage No cellulitis present  Pt is intact to light touch to the right upper extremity in the distribution of the right axillary nerve. Dressing/Incision - blood tinged drainage. Motor Function - intact, moving foot and toes well on exam.  Abdomen soft this AM, normal BS without tympany.  Past Medical History:  Diagnosis Date  . Anemia   . Anxiety   . Arthritis   . Atrial fibrillation (HCC)   . C. difficile colitis 09/2015  . COPD (chronic obstructive pulmonary disease) (HCC)   . Depression   .  Dyspnea   . Dysrhythmia   . Fibromyalgia   . H/O tracheostomy   . Hep C w/ coma, chronic (HCC)   . Hypertension   . MRSA pneumonia (HCC) 2017  . On home oxygen therapy    3 L/M   . Osteoporosis   . Peripheral neuropathy   . RLS (restless legs syndrome)   . S/P percutaneous endoscopic gastrostomy (PEG) tube placement (HCC) 09/2015  . Ventilator associated pneumonia (HCC) 10/2015   [facility], [place]   Assessment/Plan: 1 Day Post-Op Procedure(s) (LRB): REVERSE SHOULDER ARTHROPLASTY (Right) Active Problems:   Status post reverse total shoulder replacement, right  Estimated body mass index is 21.68 kg/m as calculated from the following:   Height as of this encounter: 5' (1.524 m).   Weight as of this encounter: 50.3 kg (111 lb). Advance diet Up with therapy D/C IV fluids   Labs reviewed today. Hg 7.1 this AM, patient is pale and complaining of light headedness this AM.  HR 55, patient is on a beta blocker. Continue to monitor for possible transfusion Up with therapy today. Plan will be for discharge home today pending progression with PT and symptoms relating to Hg.  DVT Prophylaxis - Foot Pumps, TED hose and Eliquis Non-weightbearing to the right upper extremity.  Valeria Batman, PA-C [facility] Orthopaedic Surgery 03/07/2017, 7:34 AM

## 2017-03-07 NOTE — Progress Notes (Signed)
Chaplain responded to a page for pt who wanted to complete AD. [place] met with pt, contacted a Insurance account manager and gathered 2 witnesses and had pt complete the AD. CH gave original and one copy to pt, and placed a copy in the pt's chart.   03/07/17 1700  Clinical Encounter Type  Visited With Patient  Visit Type Follow-up  Referral From Nurse  Consult/Referral To Chaplain  Spiritual Encounters  Spiritual Needs Literature  Stress Factors  Patient Stress Factors Health changes  Family Stress Factors Health changes  Advance Directives (For Healthcare)  Does Patient Have a Medical Advance Directive? Yes  Does patient want to make changes to medical advance directive? Yes (Inpatient - patient requests chaplain consult to change a medical advance directive)  Type of Advance Directive [place];Living will  Copy of [place] in Chart? Yes  Copy of Living Will in Chart? Yes  Would patient like information on creating a medical advance directive? Yes (Inpatient - patient requests chaplain consult to create a medical advance directive)  [place] Directives  Does Patient Have a Mental Health Advance Directive? No  Would patient like information on creating a mental health advance directive? No - Patient declined

## 2017-03-07 NOTE — Discharge Summary (Signed)
Physician Discharge Summary  Patient ID: Virginia [name] MRN: [id number] DOB/AGE: February 13, 1951 66 y.o.  Admit date: 03/06/2017 Discharge date: 03/07/17-03/08/17  Admission Diagnoses:  rotator cuff arthropathy right Massive irreparable rotator cuff tear with cuff arthropathy, right shoulder.  Discharge Diagnoses: Patient Active Problem List   Diagnosis Date Noted  . 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 A-fib (HCC) 11/09/2016  Massive irreparable rotator cuff tear with cuff arthropathy, right shoulder.  Past Medical History:  Diagnosis Date  . Anemia   . Anxiety   . Arthritis   . Atrial fibrillation (HCC)   . C. difficile colitis 09/2015  . COPD (chronic obstructive pulmonary disease) (HCC)   . Depression   . Dyspnea   . Dysrhythmia   . Fibromyalgia   . H/O tracheostomy   . Hep C w/ coma, chronic (HCC)   . Hypertension   . MRSA pneumonia (HCC) 2017  . On home oxygen therapy    3 L/M   . Osteoporosis   . Peripheral neuropathy   . RLS (restless legs syndrome)   . S/P percutaneous endoscopic gastrostomy (PEG) tube placement (HCC) 09/2015  . Ventilator associated pneumonia [facility]) 10/2015   [facility], [place]   Transfusion: None.   Consultants (if any):   Discharged Condition: Improved  Hospital Course: Virginia [name] is an 66 y.o. female who was admitted 03/06/2017 with a diagnosis of a massive irreparable rotator cuff tear with cuff arthropathy of the right shoulder and went to the operating room on 03/06/2017 and underwent the above named procedures.    Surgeries: Procedure(s): REVERSE SHOULDER ARTHROPLASTY on 03/06/2017 Patient tolerated the surgery well. Taken to PACU where she was stabilized and then transferred to the orthopedic floor.  Continued on Eliquis  twice daily. Foot pumps applied bilaterally at 80 mm. Heels elevated on bed with rolled towels. No evidence of DVT. Negative Homan. Physical therapy started on day #1 for gait  training and transfer. OT started day #1 for ADL and assisted devices.  Patient's IV was removed on POD1.  Hg 7.1 following surgery on POD1.  Pt underwent transfusion of 1 unit of PRBC.  Implants:  All press-fit [employer] Comprehensive system with a #8 mini-humeral stem, a 44 mm humeral tray with a standard insert, and a mini-base plate with a 36 mm glenosphere.  She was given perioperative antibiotics:  Anti-infectives    Start     Dose/Rate Route Frequency Ordered Stop   03/06/17 1400  ceFAZolin (ANCEF) IVPB 2g/100 mL premix     2 g 200 mL/hr over 30 Minutes Intravenous Every 6 hours 03/06/17 1238 03/07/17 0402   03/06/17 0730  ceFAZolin (ANCEF) 2-4 GM/100ML-% IVPB    Comments:  [name]   : cabinet override      03/06/17 0730 03/06/17 0855   03/05/17 2215  ceFAZolin (ANCEF) IVPB 2g/100 mL premix     2 g 200 mL/hr over 30 Minutes Intravenous  Once 03/05/17 2209 03/06/17 0910    .  She was given sequential compression devices, early ambulation, and Eliquis for DVT prophylaxis.  She benefited maximally from the hospital stay and there were no complications.    Recent vital signs:  Vitals:   03/07/17 0422 03/07/17 0735  BP: 114/80   Pulse: (!) 55   Resp: 18   Temp: 97.6 F (36.4 C)   SpO2: 95% 100%    Recent laboratory studies:  Lab Results  Component Value Date   HGB 7.1 (L) 03/07/2017   HGB  12.4 02/20/2017   HGB 10.1 (L) 11/10/2016   Lab Results  Component Value Date   WBC 7.8 03/07/2017   PLT 206 03/07/2017   Lab Results  Component Value Date   INR 1.08 02/20/2017   Lab Results  Component Value Date   NA 134 (L) 03/07/2017   K 3.5 03/07/2017   CL 99 (L) 03/07/2017   CO2 29 03/07/2017   BUN 17 03/07/2017   CREATININE 1.18 (H) 03/07/2017   GLUCOSE 116 (H) 03/07/2017    Discharge Medications:   Allergies as of 03/07/2017   No Known Allergies     Medication List    STOP taking these medications   HYDROcodone-acetaminophen 10-325 MG  tablet Commonly known as:  NORCO     TAKE these medications   apixaban 5 MG Tabs tablet Commonly known as:  ELIQUIS Take 1 tablet (5 mg total) by mouth 2 (two) times daily.   diazepam 5 MG tablet Commonly known as:  VALIUM Take 5 mg by mouth every 8 (eight) hours as needed for anxiety.   estradiol 1 MG tablet Commonly known as:  ESTRACE Take 1 mg by mouth daily.   furosemide 20 MG tablet Commonly known as:  LASIX Take 20 mg by mouth daily.   INCRUSE ELLIPTA 62.5 MCG/INH Aepb Generic drug:  umeclidinium bromide Inhale 1 puff into the lungs daily.   MAGNESIUM-OXIDE 400 (241.3 Mg) MG tablet Generic drug:  magnesium oxide Take 400 mg by mouth daily.   meloxicam 7.5 MG tablet Commonly known as:  MOBIC Take 7.5 mg by mouth daily.   metoprolol tartrate 25 MG tablet Commonly known as:  LOPRESSOR Take 2 tablets (50 mg total) by mouth 2 (two) times daily.   multivitamin tablet Take 1 tablet by mouth daily.   oxyCODONE 5 MG immediate release tablet Commonly known as:  Oxy IR/ROXICODONE Take 1-2 tablets (5-10 mg total) by mouth every 4 (four) hours as needed for breakthrough pain.   albuterol (2.5 MG/3ML) 0.083% nebulizer solution Commonly known as:  PROVENTIL Take 2.5 mg by nebulization See admin instructions. Uses three times a day. Uses a fourth time if needed for shortness of breath.   PROAIR HFA 108 (90 Base) MCG/ACT inhaler Generic drug:  albuterol Inhale 2 puffs into the lungs every 6 (six) hours as needed for wheezing or shortness of breath.   QUEtiapine 25 MG tablet Commonly known as:  SEROQUEL Take 25 mg by mouth at bedtime.   SYMBICORT 80-4.5 MCG/ACT inhaler Generic drug:  budesonide-formoterol Inhale 2 puffs into the lungs 2 (two) times daily.   traMADol 50 MG tablet Commonly known as:  ULTRAM Take 1-2 tablets (50-100 mg total) by mouth every 6 (six) hours as needed.            Discharge Care Instructions        Start     Ordered   03/07/17  0000  oxyCODONE (OXY IR/ROXICODONE) 5 MG immediate release tablet  Every 4 hours PRN    Question:  Supervising Provider  Answer:  Christena Flake   03/07/17 0743   03/07/17 0000  traMADol (ULTRAM) 50 MG tablet  Every 6 hours PRN    Question:  Supervising Provider  Answer:  Christena Flake   03/07/17 1610      Diagnostic Studies: Dg Shoulder Right Port  Result Date: 03/06/2017 CLINICAL DATA:  Reverse total shoulder arthroplasty. EXAM: PORTABLE RIGHT SHOULDER COMPARISON:  Right shoulder MRI dated January 09, 2017. FINDINGS: Postsurgical changes related  to interval reverse total shoulder arthroplasty without evidence of hardware failure or loosening. Components are well aligned. Expected postsurgical changes including skin staples and subcutaneous emphysema. The visualized right lung is clear. The bones are osteopenic. IMPRESSION: Reverse right total shoulder arthroplasty without evidence of acute postoperative complication. Electronically Signed   By: Obie Dredge M.D.   On: 03/06/2017 12:06   Disposition: Plan will be for discharge home this afternoon pending progress with PT following blood transfusion.  Follow-up Information    Anson [place], PA-C Follow up in 10 day(s).   Specialty:  Physician Assistant Why:  Mindi Slicker information: [address] [place] [address] [phone]          Signed: Meriel Pica PA-C 03/07/2017, 7:44 AM

## 2017-03-07 NOTE — Progress Notes (Signed)
PT Cancellation Note  Patient Details Name: Virginia [name] MRN: [id number] DOB: 1950-06-30   Cancelled Treatment:    Reason Eval/Treat Not Completed: Medical issues which prohibited therapy Pt's HGB 7.1 this AM, to have transfusion and likely discharge after that.  Pt was able to walk ~100 ft w/o AD POD0, has a good plan for caring for herself at home and has family that plans to help as well.  Will check-in as time allows, did issue and briefly review paper HEP for wrist/hand activity.  Malachi Pro, DPT 03/07/2017, 9:51 AM

## 2017-03-07 NOTE — Evaluation (Signed)
Occupational Therapy Evaluation Patient Details Name: Payal Stanforth MRN: [id number] DOB: 04-16-1951 Today's Date: 03/07/2017    History of Present Illness 66 y/o female s/p R reverse total shoulder replacement 9/13. PMHx include: Anemia, Anxiety, Arthritis, A-Fib, C-Diff, COPD, Depression, Dyspnea, Dysrthymia, Tracheostomy, HTN, MRSA Pneumonia, Peripheral Neuropathy, Restless Leg Syndrome, Ventilator associated Pneumonia.   Clinical Impression   Pt. is a 66 y.o. female who had a right reverse total shoulder replacement. Pt. Hemoglobin is 7.1, and Hematocrit is 20.7. Pt. Waiting a transfusion. Pt. was seen at bedside. Pt. Presents with 5/10 pain, dominant RUE immobilization, and weakness which limit her from being able to complete basic ADL care, and UE dressing. Pt. Will benefit from skilled OT services for ADL training, A/E training, energy conservation/work simplification techniques, and pt. education about home modification, and DME. Pt. Will require assist for UE dressing tasks secondary to right shoulder immobilization, and the nature of the immobilizer. Pt. Will benefit from follow-up HHOT services.    Follow Up Recommendations  Home health OT    Equipment Recommendations       Recommendations for Other Services       Precautions / Restrictions Precautions Precautions: Shoulder Shoulder Interventions: Shoulder sling/immobilizer Restrictions Weight Bearing Restrictions: Yes RUE Weight Bearing: Non weight bearing                                                    ADL either performed or assessed with clinical judgement   ADL Overall ADL's : Needs assistance/impaired Eating/Feeding: Set up;Minimal assistance   Grooming: Minimal assistance;Set up   Upper Body Bathing: Maximal assistance   Lower Body Bathing: Moderate assistance   Upper Body Dressing : Maximal assistance   Lower Body Dressing: Moderate assistance                 General ADL  Comments: Pt. education was provided about OT services, self-care techniques, and follow-up care rec.     Vision Baseline Vision/History: Wears glasses Patient Visual Report: No change from baseline       Perception     Praxis      Pertinent Vitals/Pain Pain Assessment: 0-10 Pain Score: 5  Pain Location: Right shoulder Pain Descriptors / Indicators: Sore Pain Intervention(s): Limited activity within patient's tolerance;Monitored during session     Hand Dominance Right   Extremity/Trunk Assessment Upper Extremity Assessment Upper Extremity Assessment: RUE deficits/detail RUE: Unable to fully assess due to immobilization           Communication Communication Communication: No difficulties   Cognition Arousal/Alertness: Awake/alert Behavior During Therapy: WFL for tasks assessed/performed Overall Cognitive Status: Within Functional Limits for tasks assessed                                     General Comments       Exercises     Shoulder Instructions      Home Living Family/patient expects to be discharged to:: Private residence Living Arrangements: Alone Available Help at Discharge: Family Type of Home: House Home Access: Level entry     Home Layout: One level           Bathroom Accessibility: Yes   Home Equipment: Bedside commode          Prior Functioning/Environment  Level of Independence: Independent        Comments: Pt. reports independence with ADLs, and IADLs. at home. Pt. reports practicing using her Left hand at home in preparation  for the surgery.        OT Problem List: Decreased strength;Decreased range of motion;Pain;Impaired UE functional use;Decreased knowledge of use of DME or AE      OT Treatment/Interventions: Self-care/ADL training;DME and/or AE instruction;Therapeutic activities;Energy conservation;Patient/family education    OT Goals(Current goals can be found in the care plan section) Acute Rehab OT  Goals Patient Stated Goal: To go home OT Goal Formulation: With patient Potential to Achieve Goals: Good  OT Frequency: Min 2X/week   Barriers to D/C:            Co-evaluation              AM-PAC PT "6 Clicks" Daily Activity     Outcome Measure Help from another person eating meals?: A Little Help from another person taking care of personal grooming?: A Little Help from another person toileting, which includes using toliet, bedpan, or urinal?: A Little Help from another person bathing (including washing, rinsing, drying)?: A Lot Help from another person to put on and taking off regular upper body clothing?: A Lot Help from another person to put on and taking off regular lower body clothing?: A Lot 6 Click Score: 15   End of Session    Activity Tolerance: Patient limited by pain;Patient tolerated treatment well Patient left: in bed;with call bell/phone within reach;with bed alarm set  OT Visit Diagnosis: Muscle weakness (generalized) (M62.81)                Time: 1610-9604 OT Time Calculation (min): 30 min Charges:  OT General Charges $OT Visit: 1 Visit OT Evaluation $OT Eval Low Complexity: 1 Low G-Codes: OT G-codes **NOT FOR INPATIENT CLASS** Functional Limitation: Self care Self Care Current Status (V4098): At least 40 percent but less than 60 percent impaired, limited or restricted Self Care Goal Status (J1914): At least 1 percent but less than 20 percent impaired, limited or restricted  [name], MS, OTR/L  [name], MS, OTR/L 03/07/2017, 12:31 PM

## 2017-03-07 NOTE — Discharge Instructions (Signed)
Diet: As you were doing prior to hospitalization  ? ?Shower:  May shower but keep the wounds dry, use an occlusive plastic wrap, NO SOAKING IN TUB.  If the bandage gets wet, change with a clean dry gauze. ? ?Dressing:  You may change your dressing as needed. Change the dressing with sterile gauze dressing.   ? ?Activity:  Increase activity slowly as tolerated, but follow the weight bearing instructions below.  No lifting or driving for 6 weeks. ? ?Weight Bearing:   Non-weightbearing to the right arm. ? ?To prevent constipation: you may use a stool softener such as - ? ?Colace (over the counter) 100 mg by mouth twice a day  ?Drink plenty of fluids (prune juice may be helpful) and high fiber foods ?Miralax (over the counter) for constipation as needed.   ? ?Itching:  If you experience itching with your medications, try taking only a single pain pill, or even half a pain pill at a time.  You may take up to 10 pain pills per day, and you can also use benadryl over the counter for itching or also to help with sleep.  ? ?Precautions:  If you experience chest pain or shortness of breath - call 911 immediately for transfer to the hospital emergency department!! ? ?If you develop a fever greater that 101 F, purulent drainage from wound, increased redness or drainage from wound, or calf pain-Call [facility]                                              ?Follow- Up Appointment:  Please call for an appointment to be seen in 2 weeks at [facility]  ?

## 2017-03-08 LAB — TYPE AND SCREEN
ABO/RH(D): B POS
ANTIBODY SCREEN: NEGATIVE
Unit division: 0

## 2017-03-08 LAB — BPAM RBC
Blood Product Expiration Date: 201810052359
ISSUE DATE / TIME: 201809141126
Unit Type and Rh: 7300

## 2017-03-10 LAB — SURGICAL PATHOLOGY

## 2017-06-24 ENCOUNTER — Encounter: Payer: Self-pay | Admitting: Emergency Medicine

## 2017-06-24 ENCOUNTER — Emergency Department
Admission: EM | Admit: 2017-06-24 | Discharge: 2017-06-24 | Disposition: A | Discharge status: Home / Self Care | Payer: Medicare Other | Attending: Emergency Medicine | Admitting: Emergency Medicine

## 2017-06-24 ENCOUNTER — Emergency Department: Payer: Medicare Other

## 2017-06-24 DIAGNOSIS — J40 Bronchitis, not specified as acute or chronic: Secondary | ICD-10-CM | POA: Diagnosis not present

## 2017-06-24 DIAGNOSIS — R0602 Shortness of breath: Secondary | ICD-10-CM | POA: Diagnosis present

## 2017-06-24 DIAGNOSIS — Z791 Long term (current) use of non-steroidal anti-inflammatories (NSAID): Secondary | ICD-10-CM | POA: Diagnosis not present

## 2017-06-24 DIAGNOSIS — I10 Essential (primary) hypertension: Secondary | ICD-10-CM | POA: Diagnosis not present

## 2017-06-24 DIAGNOSIS — Z87891 Personal history of nicotine dependence: Secondary | ICD-10-CM | POA: Diagnosis not present

## 2017-06-24 DIAGNOSIS — Z79899 Other long term (current) drug therapy: Secondary | ICD-10-CM | POA: Insufficient documentation

## 2017-06-24 DIAGNOSIS — Z7901 Long term (current) use of anticoagulants: Secondary | ICD-10-CM | POA: Diagnosis not present

## 2017-06-24 DIAGNOSIS — J441 Chronic obstructive pulmonary disease with (acute) exacerbation: Secondary | ICD-10-CM

## 2017-06-24 LAB — CBC
HCT: 31 % — ABNORMAL LOW (ref 35.0–47.0)
Hemoglobin: 10.2 g/dL — ABNORMAL LOW (ref 12.0–16.0)
MCH: 30.2 pg (ref 26.0–34.0)
MCHC: 32.8 g/dL (ref 32.0–36.0)
MCV: 91.9 fL (ref 80.0–100.0)
PLATELETS: 230 10*3/uL (ref 150–440)
RBC: 3.37 MIL/uL — AB (ref 3.80–5.20)
RDW: 14.5 % (ref 11.5–14.5)
WBC: 4.9 10*3/uL (ref 3.6–11.0)

## 2017-06-24 LAB — TROPONIN I: Troponin I: <0.03 ng/mL (ref <0.03)

## 2017-06-24 LAB — BASIC METABOLIC PANEL
ANION GAP: 4 — AB (ref 5–15)
BUN: 15 mg/dL (ref 6–20)
CHLORIDE: 97 mmol/L — AB (ref 101–111)
CO2: 33 mmol/L — ABNORMAL HIGH (ref 22–32)
CREATININE: 0.94 mg/dL (ref 0.44–1.00)
Calcium: 8.5 mg/dL — ABNORMAL LOW (ref 8.9–10.3)
GFR calc non Af Amer: >60 mL/min (ref >60)
Glucose, Bld: 67 mg/dL (ref 65–99)
POTASSIUM: 3.9 mmol/L (ref 3.5–5.1)
SODIUM: 134 mmol/L — AB (ref 135–145)

## 2017-06-24 LAB — INFLUENZA PANEL BY PCR (TYPE A & B)
INFLBPCR: NEGATIVE
Influenza A By PCR: NEGATIVE

## 2017-06-24 MED ORDER — ALBUTEROL SULFATE (2.5 MG/3ML) 0.083% IN NEBU
5.0000 mg | INHALATION_SOLUTION | Freq: Once | RESPIRATORY_TRACT | Status: AC
  Administered 2017-06-24: 5 mg via RESPIRATORY_TRACT
  Filled 2017-06-24: qty 6

## 2017-06-24 MED ORDER — AMOXICILLIN-POT CLAVULANATE 875-125 MG PO TABS
1.0000 | ORAL_TABLET | Freq: Once | ORAL | Status: AC
  Administered 2017-06-24: 1 via ORAL
  Filled 2017-06-24: qty 1

## 2017-06-24 MED ORDER — PREDNISONE 20 MG PO TABS
60.0000 mg | ORAL_TABLET | Freq: Once | ORAL | Status: AC
  Administered 2017-06-24: 60 mg via ORAL
  Filled 2017-06-24: qty 3

## 2017-06-24 MED ORDER — AMOXICILLIN-POT CLAVULANATE 875-125 MG PO TABS: 1.0000 | ORAL_TABLET | Freq: Two times a day (BID) | ORAL | 0 refills | Status: AC

## 2017-06-24 MED ORDER — IPRATROPIUM-ALBUTEROL 0.5-2.5 (3) MG/3ML IN SOLN
3.0000 mL | Freq: Once | RESPIRATORY_TRACT | Status: AC
  Administered 2017-06-24: 3 mL via RESPIRATORY_TRACT
  Filled 2017-06-24: qty 3

## 2017-06-24 MED ORDER — PREDNISONE 20 MG PO TABS: 60.0000 mg | ORAL_TABLET | Freq: Every day | ORAL | 0 refills | Status: AC

## 2017-06-24 NOTE — ED Provider Notes (Signed)
[facility] Emergency Department Provider Note  ____________________________________________  Time seen: Approximately 6:34 PM  I have reviewed the triage vital signs and the nursing notes.   HISTORY  Chief Complaint Shortness of Breath   HPI Virginia [name] is a 67 y.o. female with a history of COPD on 4 L nasal cannula, atrial fibrillation, hypertension, hepatitis C who presents for evaluation of shortness of breath. Patient reports 2-3 days of cough, congestion, sore throat, body aches, chills, night sweats. Today she felt progressively worsening shortness of breath. currently she is complaining of moderate shortness of breath that has been constant, worse with exertion. Nothing makes it better. No fever or chest pain. Patient has had diarrhea but no vomiting, no nausea, no abdominal pain.  Past Medical History:  Diagnosis Date  . Anemia   . Anxiety   . Arthritis   . Atrial fibrillation (HCC)   . C. difficile colitis 09/2015  . COPD (chronic obstructive pulmonary disease) (HCC)   . Depression   . Dyspnea   . Dysrhythmia   . Fibromyalgia   . H/O tracheostomy   . Hep C w/ coma, chronic (HCC)   . Hypertension   . MRSA pneumonia (HCC) 2017  . On home oxygen therapy    3 L/M   . Osteoporosis   . Peripheral neuropathy   . RLS (restless legs syndrome)   . S/P percutaneous endoscopic gastrostomy (PEG) tube placement (HCC) 09/2015  . Ventilator associated pneumonia [facility](HCC) 10/2015   [facility] [place]    Patient Active Problem List   Diagnosis Date Noted  . 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 A-fib (HCC) 11/09/2016    Past Surgical History:  Procedure Laterality Date  . ABDOMINAL HYSTERECTOMY    . BREAST SURGERY Bilateral    Breast Implants  . DILATION AND CURETTAGE OF UTERUS    . REVERSE SHOULDER ARTHROPLASTY Right 03/06/2017   Procedure: REVERSE SHOULDER ARTHROPLASTY;  Surgeon: Christena FlakePoggi, John J, MD;   Location: [facility] ORS;  Service: Orthopedics;  Laterality: Right;  . ROTATOR CUFF REPAIR Bilateral     Prior to Admission medications   Medication Sig Start Date End Date Taking? Authorizing Provider  albuterol (PROVENTIL) (2.5 MG/3ML) 0.083% nebulizer solution Take 2.5 mg by nebulization See admin instructions. Uses three times a day. Uses a fourth time if needed for shortness of breath.    [provider]  amoxicillin-clavulanate (AUGMENTIN) 875-125 MG tablet Take 1 tablet by mouth 2 (two) times daily for 10 days. 06/24/17 07/04/17  Nita SickleVeronese, [place], MD  apixaban (ELIQUIS) 5 MG TABS tablet Take 1 tablet (5 mg total) by mouth 2 (two) times daily. 11/10/16   Adrian SaranMody, [place], MD  diazepam (VALIUM) 5 MG tablet Take 5 mg by mouth every 8 (eight) hours as needed for anxiety.  10/22/16   [provider]  estradiol (ESTRACE) 1 MG tablet Take 1 mg by mouth daily. 09/10/16   [provider]  furosemide (LASIX) 20 MG tablet Take 20 mg by mouth daily.  10/01/16   [provider]  INCRUSE ELLIPTA 62.5 MCG/INH AEPB Inhale 1 puff into the lungs daily. 10/24/16   [provider]  MAGNESIUM-OXIDE 400 (241.3 Mg) MG tablet Take 400 mg by mouth daily.  08/24/16   [provider]  meloxicam (MOBIC) 7.5 MG tablet Take 7.5 mg by mouth daily. 10/01/16   [provider]  metoprolol tartrate (LOPRESSOR) 25 MG tablet Take 2 tablets (50 mg total) by mouth 2 (  two) times daily. 11/10/16   Adrian Saran, MD  Multiple Vitamin (MULTIVITAMIN) tablet Take 1 tablet by mouth daily.    [provider]  oxyCODONE (OXY IR/ROXICODONE) 5 MG immediate release tablet Take 1-2 tablets (5-10 mg total) by mouth every 4 (four) hours as needed for breakthrough pain. 03/07/17   Anson Oregon, PA-C  predniSONE (DELTASONE) 20 MG tablet Take 3 tablets (60 mg total) by mouth daily for 4 days. 06/24/17 06/28/17  Nita Sickle, MD  PROAIR HFA 108 [phone] Base) MCG/ACT inhaler Inhale 2 puffs into  the lungs every 6 (six) hours as needed for wheezing or shortness of breath.  10/23/16   [provider]  QUEtiapine (SEROQUEL) 25 MG tablet Take 25 mg by mouth at bedtime.  10/01/16   [provider]  SYMBICORT 80-4.5 MCG/ACT inhaler Inhale 2 puffs into the lungs 2 (two) times daily. 10/24/16   [provider]  traMADol (ULTRAM) 50 MG tablet Take 1-2 tablets (50-100 mg total) by mouth every 6 (six) hours as needed. 03/07/17   Anson [place], PA-C    Allergies Patient has no known allergies.  Family History  Problem Relation Age of Onset  . Hypertension Mother     Social History Social History   Tobacco Use  . Smoking status: Former Smoker    Packs/day: 1.50    Types: Cigarettes    Last attempt to quit: 10/05/2015    Years since quitting: 1.7  . Smokeless tobacco: Never Used  Substance Use Topics  . Alcohol use: No  . Drug use: No    Review of Systems  Constitutional: Negative for fever. + chills, body aches, sweats Eyes: Negative for visual changes. ENT: + sore throat. Neck: No neck pain  Cardiovascular: Negative for chest pain. Respiratory: + shortness of breath, cough, congestion Gastrointestinal: Negative for abdominal pain, vomiting. + diarrhea. Genitourinary: Negative for dysuria. Musculoskeletal: Negative for back pain. Skin: Negative for rash. Neurological: Negative for headaches, weakness or numbness. Psych: No SI or HI  ____________________________________________   PHYSICAL EXAM:  VITAL SIGNS: ED Triage Vitals  Enc Vitals Group     BP 06/24/17 1332 (!) 141/84     Pulse Rate 06/24/17 1332 60     Resp 06/24/17 1332 16     Temp 06/24/17 1332 98.2 F (36.8 C)     Temp Source 06/24/17 1332 Oral     SpO2 06/24/17 1332 100 %     Weight 06/24/17 1332 112 lb (50.8 kg)     Height 06/24/17 1332 5' (1.524 m)     Head Circumference --      Peak Flow --      Pain Score 06/24/17 1340 7     Pain Loc --      Pain Edu? --      Excl.  in GC? --     Constitutional: Alert and oriented. Well appearing and in no apparent distress. HEENT:      Head: Normocephalic and atraumatic.         Eyes: Conjunctivae are normal. Sclera is non-icteric.       Mouth/Throat: Mucous membranes are moist.       Neck: Supple with no signs of meningismus. Cardiovascular: Regular rate and rhythm. No murmurs, gallops, or rubs. 2+ symmetrical distal pulses are present in all extremities. No JVD. Respiratory: Normal respiratory effort. and satting well on 4 L nasal cannula which is her baseline, slightly diminished air movement bilaterally with faint expiratory wheezes Gastrointestinal: Soft, non  tender, and non distended with positive bowel sounds. No rebound or guarding. Musculoskeletal: Nontender with normal range of motion in all extremities. No edema, cyanosis, or erythema of extremities. Neurologic: Normal speech and language. Face is symmetric. Moving all extremities. No gross focal neurologic deficits are appreciated. Skin: Skin is warm, dry and intact. No rash noted. Psychiatric: Mood and affect are normal. Speech and behavior are normal.  ____________________________________________   LABS (all labs ordered are listed, but only abnormal results are displayed)  Labs Reviewed  BASIC METABOLIC PANEL - Abnormal; Notable for the following components:      Result Value   Sodium 134 (*)    Chloride 97 (*)    CO2 33 (*)    Calcium 8.5 (*)    Anion gap 4 (*)    All other components within normal limits  CBC - Abnormal; Notable for the following components:   RBC 3.37 (*)    Hemoglobin 10.2 (*)    HCT 31.0 (*)    All other components within normal limits  TROPONIN I  INFLUENZA PANEL BY PCR (TYPE A & B)   ____________________________________________  EKG  ED ECG REPORT I, Nita Sickle, the attending physician, personally viewed and interpreted this ECG.  A. fib, rate of 63, normal QTC, normal axis, no ST elevations or  depressions, Q waves on anterior leads. no prior for comparison. ____________________________________________  RADIOLOGY  CXR:  1. Changes of COPD. 2. No acute cardiopulmonary disease. ____________________________________________   PROCEDURES  Procedure(s) performed: None Procedures Critical Care performed:  None ____________________________________________   INITIAL IMPRESSION / ASSESSMENT AND PLAN / ED COURSE   67 y.o. female with a history of COPD on 4 L nasal cannula, atrial fibrillation, hypertension, hepatitis C who presents for evaluation of shortness of breath, wheezing, cough, congestion, body aches, chills, sore throat, and diarrhea. Patient with normal work of breathing, normal sats on 4 L nasal cannula which is her baseline, decreased air movement with faint wheezes concerning for COPD exacerbation in the setting of a viral URI versus pneumonia versus bronchitis versus flu. Chest x-ray with no infiltrate. We'll check for flu. BMP with no significant, normal white count, stable hemoglobin, negative troponin. We'll give DuoNeb, prednisone, and start patient on Augmentin for possible bronchitis.     ----------------------------------------- 6:20 PM on 06/24/2017 ----------------------------------------- OBSERVATION CARE: This patient is being placed under observation care for the following reasons: COPD requiring multiple duonebs and reassessment.  ----------------------------------------- 7:00 PM on 06/24/2017 ----------------------------------------- Patient feels markedly improved after 3 breathing treatments. Fluids pending. We'll continue to monitor.  ----------------------------------------- 7:57 PM on 06/24/2017 ----------------------------------------- END OF OBSERVATION STATUS: After an appropriate period of observation, this patient is being discharged due to the following reason(s):  patient feels markedly improved after her breathing treatments. Flu is  negative. The patient can be discharged home on prednisone, Augmentin, and breathing treatments for bronchitis. Discussed return precautions and close follow-up with primary care doctor.   As part of my medical decision making, I reviewed the following data within the electronic MEDICAL RECORD NUMBER Nursing notes reviewed and incorporated, Labs reviewed , EKG interpreted , Old EKG reviewed, Old chart reviewed, Radiograph reviewed , Notes from prior ED visits and [place] Controlled Substance Database    Pertinent labs & imaging results that were available during my care of the patient were reviewed by me and considered in my medical decision making (see chart for details).    ____________________________________________   FINAL CLINICAL IMPRESSION(S) / ED DIAGNOSES  Final diagnoses:  COPD exacerbation (HCC)  Bronchitis      NEW MEDICATIONS STARTED DURING THIS VISIT:  ED Discharge Orders        Ordered    predniSONE (DELTASONE) 20 MG tablet  Daily     06/24/17 1955    amoxicillin-clavulanate (AUGMENTIN) 875-125 MG tablet  2 times daily     06/24/17 1955       Note:  This document was prepared using Dragon voice recognition software and may include unintentional dictation errors.    Don Perking, [place], MD 06/24/17 1958

## 2017-06-24 NOTE — ED Triage Notes (Signed)
Pt in via POV with complaints of acute onset shortness of breath this morning.  Pt with hx of COPD, on 3L nasal cannula at baseline.  Pt reports recent congestion, cough.  Vitals WDL, NAD noted at this time.

## 2017-07-10 ENCOUNTER — Other Ambulatory Visit (HOSPITAL_COMMUNITY): Payer: Self-pay | Admitting: Internal Medicine

## 2017-07-10 ENCOUNTER — Other Ambulatory Visit: Payer: Self-pay | Admitting: Internal Medicine

## 2017-07-10 DIAGNOSIS — J9611 Chronic respiratory failure with hypoxia: Secondary | ICD-10-CM

## 2017-07-30 ENCOUNTER — Ambulatory Visit
Admission: RE | Admit: 2017-07-30 | Discharge: 2017-07-30 | Disposition: A | Discharge status: Home / Self Care | Payer: Medicare Other | Source: Ambulatory Visit | Attending: Internal Medicine | Admitting: Internal Medicine

## 2017-07-30 DIAGNOSIS — I2510 Atherosclerotic heart disease of native coronary artery without angina pectoris: Secondary | ICD-10-CM | POA: Insufficient documentation

## 2017-07-30 DIAGNOSIS — J9611 Chronic respiratory failure with hypoxia: Secondary | ICD-10-CM | POA: Diagnosis present

## 2017-07-30 DIAGNOSIS — J438 Other emphysema: Secondary | ICD-10-CM | POA: Diagnosis not present

## 2017-07-30 DIAGNOSIS — M40294 Other kyphosis, thoracic region: Secondary | ICD-10-CM | POA: Diagnosis not present

## 2017-07-30 DIAGNOSIS — M8580 Other specified disorders of bone density and structure, unspecified site: Secondary | ICD-10-CM | POA: Diagnosis not present

## 2017-08-27 DIAGNOSIS — I712 Thoracic aortic aneurysm, without rupture, unspecified: Secondary | ICD-10-CM | POA: Insufficient documentation

## 2018-01-15 ENCOUNTER — Other Ambulatory Visit: Payer: Self-pay | Admitting: Internal Medicine

## 2018-01-15 DIAGNOSIS — Z1231 Encounter for screening mammogram for malignant neoplasm of breast: Secondary | ICD-10-CM

## 2018-04-09 ENCOUNTER — Other Ambulatory Visit: Payer: Self-pay | Admitting: Nurse Practitioner

## 2018-04-09 DIAGNOSIS — R1310 Dysphagia, unspecified: Secondary | ICD-10-CM

## 2018-04-12 DIAGNOSIS — R12 Heartburn: Secondary | ICD-10-CM | POA: Insufficient documentation

## 2018-04-12 DIAGNOSIS — R1310 Dysphagia, unspecified: Secondary | ICD-10-CM | POA: Insufficient documentation

## 2018-04-13 ENCOUNTER — Other Ambulatory Visit: Payer: Self-pay | Admitting: Nurse Practitioner

## 2018-04-13 DIAGNOSIS — R1310 Dysphagia, unspecified: Secondary | ICD-10-CM

## 2018-04-21 ENCOUNTER — Ambulatory Visit: Payer: Medicare Other

## 2018-04-29 ENCOUNTER — Other Ambulatory Visit: Payer: Medicare Other

## 2018-04-29 ENCOUNTER — Ambulatory Visit
Admission: RE | Admit: 2018-04-29 | Discharge: 2018-04-29 | Disposition: A | Discharge status: Home / Self Care | Payer: Medicare Other | Source: Ambulatory Visit | Attending: Nurse Practitioner | Admitting: Nurse Practitioner

## 2018-04-29 ENCOUNTER — Other Ambulatory Visit: Payer: Self-pay | Admitting: Nurse Practitioner

## 2018-04-29 DIAGNOSIS — R12 Heartburn: Secondary | ICD-10-CM | POA: Insufficient documentation

## 2018-04-29 DIAGNOSIS — R1310 Dysphagia, unspecified: Secondary | ICD-10-CM

## 2018-04-30 DIAGNOSIS — I2510 Atherosclerotic heart disease of native coronary artery without angina pectoris: Secondary | ICD-10-CM | POA: Insufficient documentation

## 2018-04-30 DIAGNOSIS — R002 Palpitations: Secondary | ICD-10-CM | POA: Insufficient documentation

## 2019-01-15 ENCOUNTER — Inpatient Hospital Stay: Payer: Medicare Other | Attending: Oncology | Admitting: Oncology

## 2019-01-15 ENCOUNTER — Encounter (INDEPENDENT_AMBULATORY_CARE_PROVIDER_SITE_OTHER): Payer: Self-pay

## 2019-01-15 ENCOUNTER — Other Ambulatory Visit: Payer: Self-pay

## 2019-01-15 ENCOUNTER — Inpatient Hospital Stay: Payer: Medicare Other

## 2019-01-15 VITALS — BP 132/87 | HR 61 | Temp 96.2°F | Resp 20 | Wt 103.5 lb

## 2019-01-15 DIAGNOSIS — D649 Anemia, unspecified: Secondary | ICD-10-CM

## 2019-01-15 DIAGNOSIS — Z79899 Other long term (current) drug therapy: Secondary | ICD-10-CM | POA: Insufficient documentation

## 2019-01-15 DIAGNOSIS — Z87891 Personal history of nicotine dependence: Secondary | ICD-10-CM | POA: Diagnosis not present

## 2019-01-15 DIAGNOSIS — G629 Polyneuropathy, unspecified: Secondary | ICD-10-CM | POA: Insufficient documentation

## 2019-01-15 DIAGNOSIS — Z7901 Long term (current) use of anticoagulants: Secondary | ICD-10-CM | POA: Insufficient documentation

## 2019-01-15 DIAGNOSIS — R5382 Chronic fatigue, unspecified: Secondary | ICD-10-CM | POA: Diagnosis not present

## 2019-01-15 DIAGNOSIS — Z9981 Dependence on supplemental oxygen: Secondary | ICD-10-CM | POA: Insufficient documentation

## 2019-01-15 DIAGNOSIS — I10 Essential (primary) hypertension: Secondary | ICD-10-CM | POA: Diagnosis not present

## 2019-01-15 LAB — CBC
HCT: 24.3 % — ABNORMAL LOW (ref 36.0–46.0)
Hemoglobin: 7.4 g/dL — ABNORMAL LOW (ref 12.0–15.0)
MCH: 26.8 pg (ref 26.0–34.0)
MCHC: 30.5 g/dL (ref 30.0–36.0)
MCV: 88 fL (ref 80.0–100.0)
Platelets: 306 10*3/uL (ref 150–400)
RBC: 2.76 MIL/uL — ABNORMAL LOW (ref 3.87–5.11)
RDW: 15 % (ref 11.5–15.5)
WBC: 6.8 10*3/uL (ref 4.0–10.5)
nRBC: 0 % (ref 0.0–0.2)

## 2019-01-15 LAB — FOLATE: Folate: 39 ng/mL (ref >5.9)

## 2019-01-15 LAB — RETICULOCYTES
Immature Retic Fract: 13.5 % (ref 2.3–15.9)
RBC.: 2.76 MIL/uL — ABNORMAL LOW (ref 3.87–5.11)
Retic Count, Absolute: 29 10*3/uL (ref 19.0–186.0)
Retic Ct Pct: 1.1 % (ref 0.4–3.1)

## 2019-01-15 LAB — LACTATE DEHYDROGENASE: LDH: 175 U/L (ref 98–192)

## 2019-01-15 LAB — VITAMIN B12: Vitamin B-12: 290 pg/mL (ref 180–914)

## 2019-01-15 LAB — DAT, POLYSPECIFIC AHG (ARMC ONLY): Polyspecific AHG test: NEGATIVE

## 2019-01-16 LAB — ANA W/REFLEX: Anti Nuclear Antibody (ANA): NEGATIVE

## 2019-01-16 LAB — HAPTOGLOBIN: Haptoglobin: 226 mg/dL (ref 37–355)

## 2019-01-16 LAB — ERYTHROPOIETIN: Erythropoietin: 24 m[IU]/mL — ABNORMAL HIGH (ref 2.6–18.5)

## 2019-01-17 DIAGNOSIS — D649 Anemia, unspecified: Secondary | ICD-10-CM | POA: Insufficient documentation

## 2019-01-17 DIAGNOSIS — D638 Anemia in other chronic diseases classified elsewhere: Secondary | ICD-10-CM | POA: Insufficient documentation

## 2019-01-17 NOTE — Progress Notes (Signed)
[place]  [phone] Fax:([phone]) [phone]  ID: Virginia [name] OB: 21-Dec-1950  MR#: [id number]  STM#:[id number]  Patient Care Team: Idelle Crouch, MD as PCP - General (Internal Medicine)  CHIEF COMPLAINT: Anemia, unspecified  INTERVAL HISTORY: Patient is a 68 year old female with multiple medical problems who was noted to have a declining hemoglobin on routine blood work.  She has chronic shortness of breath and requires oxygen 24 hours a day.  She has chronic weakness and fatigue.  She has no neurologic complaints.  She denies any recent fevers or illnesses.  She has a good appetite and denies weight loss.  She has no chest pain, shortness of breath, cough, or hemoptysis.  She denies any nausea, vomiting, constipation, or diarrhea.  She has no melena or hematochezia.  She has no urinary complaints.  Patient offers no specific complaints today.  REVIEW OF SYSTEMS:   Review of Systems  Constitutional: Positive for malaise/fatigue. Negative for fever and weight loss.  Respiratory: Positive for shortness of breath. Negative for cough and hemoptysis.   Cardiovascular: Negative.  Negative for chest pain and leg swelling.  Gastrointestinal: Negative.  Negative for abdominal pain, blood in stool and melena.  Genitourinary: Negative.  Negative for hematuria.  Musculoskeletal: Negative.  Negative for back pain.  Skin: Negative.  Negative for rash.  Neurological: Positive for weakness. Negative for dizziness, focal weakness and headaches.  Psychiatric/Behavioral: The patient is nervous/anxious.     As per HPI. Otherwise, a complete review of systems is negative.  PAST MEDICAL HISTORY: Past Medical History:  Diagnosis Date  . Anemia   . Anxiety   . Arthritis   . Atrial fibrillation ([place])   . C. difficile colitis 09/2015  . COPD (chronic obstructive pulmonary disease) ([place])   . Depression   . Dyspnea   . Dysrhythmia   . Fibromyalgia   . H/O  tracheostomy   . Hep C w/ coma, chronic ([place])   . Hypertension   . MRSA pneumonia ([place]) 2017  . On home oxygen therapy    3 L/M   . Osteoporosis   . Peripheral neuropathy   . RLS (restless legs syndrome)   . S/P percutaneous endoscopic gastrostomy (PEG) tube placement ([place]) 09/2015  . Ventilator associated pneumonia [facility]) 10/2015   [facility] [place]    PAST SURGICAL HISTORY: Past Surgical History:  Procedure Laterality Date  . ABDOMINAL HYSTERECTOMY    . BREAST SURGERY Bilateral    Breast Implants  . DILATION AND CURETTAGE OF UTERUS    . REVERSE SHOULDER ARTHROPLASTY Right 03/06/2017   Procedure: REVERSE SHOULDER ARTHROPLASTY;  Surgeon: Corky Mull, MD;  Location: [facility] ORS;  Service: Orthopedics;  Laterality: Right;  . ROTATOR CUFF REPAIR Bilateral     FAMILY HISTORY: Family History  Problem Relation Age of Onset  . Hypertension Mother     ADVANCED DIRECTIVES (Y/N):  N  HEALTH MAINTENANCE: Social History   Tobacco Use  . Smoking status: Former Smoker    Packs/day: 1.50    Types: Cigarettes    Quit date: 10/05/2015    Years since quitting: 3.2  . Smokeless tobacco: Never Used  Substance Use Topics  . Alcohol use: No  . Drug use: No     Colonoscopy:  PAP:  Bone density:  Lipid panel:  No Known Allergies  Current Outpatient Medications  Medication Sig Dispense Refill  . albuterol (PROVENTIL) (2.5 MG/3ML) 0.083% nebulizer solution Take 2.5 mg by nebulization See admin instructions. Uses three  times a day. Uses a fourth time if needed for shortness of breath.    Marland Kitchen amitriptyline (ELAVIL) 25 MG tablet Take 25 mg by mouth at bedtime.     Marland Kitchen apixaban (ELIQUIS) 5 MG TABS tablet Take 1 tablet (5 mg total) by mouth 2 (two) times daily. 60 tablet 0  . diazepam (VALIUM) 5 MG tablet Take 5 mg by mouth every 8 (eight) hours as needed for anxiety.   5  . diltiazem (CARDIZEM) 30 MG tablet     . estradiol (ESTRACE) 1 MG tablet Take 1 mg by mouth daily.  1  .  Fluticasone-Umeclidin-Vilant 100-62.5-25 MCG/INH AEPB Inhale into the lungs.    . furosemide (LASIX) 20 MG tablet Take 20 mg by mouth daily.   1  . gabapentin (NEURONTIN) 100 MG capsule Take 100 mg by mouth 3 (three) times daily.     Marland Kitchen lisinopril (ZESTRIL) 10 MG tablet TK 1 T PO ONCE D    . MAGNESIUM-OXIDE 400 (241.3 Mg) MG tablet Take 400 mg by mouth daily.   1  . meloxicam (MOBIC) 7.5 MG tablet Take 7.5 mg by mouth daily.  1  . metoprolol tartrate (LOPRESSOR) 25 MG tablet Take 2 tablets (50 mg total) by mouth 2 (two) times daily. 60 tablet 0  . Multiple Vitamin (MULTIVITAMIN) tablet Take 1 tablet by mouth daily.    Marland Kitchen oxyCODONE (OXY IR/ROXICODONE) 5 MG immediate release tablet Take 1-2 tablets (5-10 mg total) by mouth every 4 (four) hours as needed for breakthrough pain. 60 tablet 0  . PROAIR HFA 108 (90 Base) MCG/ACT inhaler Inhale 2 puffs into the lungs every 6 (six) hours as needed for wheezing or shortness of breath.   11  . QUEtiapine (SEROQUEL) 25 MG tablet Take 25 mg by mouth at bedtime.   1  . HYDROcodone-acetaminophen (NORCO) 10-325 MG tablet TK 1 T PO Q 6 H PRN P     No current facility-administered medications for this visit.     OBJECTIVE: Vitals:   01/15/19 1521  BP: 132/87  Pulse: 61  Resp: 20  Temp: (!) 96.2 F (35.7 C)     Body mass index is 20.21 kg/m.    ECOG FS:1 - Symptomatic but completely ambulatory  General: Ill-appearing, thin, mild respiratory distress. Eyes: Pink conjunctiva, anicteric sclera. HEENT: Normocephalic, moist mucous membranes, clear oropharnyx. Lungs: Clear to auscultation bilaterally. Heart: Regular rate and rhythm. No rubs, murmurs, or gallops. Abdomen: Soft, nontender, nondistended. No organomegaly noted, normoactive bowel sounds. Musculoskeletal: No edema, cyanosis, or clubbing. Neuro: Alert, answering all questions appropriately. Cranial nerves grossly intact. Skin: No rashes or petechiae noted. Psych: Normal affect. Lymphatics: No  cervical, calvicular, axillary or inguinal LAD.   LAB RESULTS:  Lab Results  Component Value Date   NA 134 (L) 06/24/2017   K 3.9 06/24/2017   CL 97 (L) 06/24/2017   CO2 33 (H) 06/24/2017   GLUCOSE 67 06/24/2017   BUN 15 06/24/2017   CREATININE 0.94 06/24/2017   CALCIUM 8.5 (L) 06/24/2017   PROT 7.2 11/09/2016   ALBUMIN 4.1 11/09/2016   AST 27 11/09/2016   ALT 19 11/09/2016   ALKPHOS 18 (L) 11/09/2016   BILITOT 0.5 11/09/2016   GFRNONAA >60 06/24/2017   GFRAA >60 06/24/2017    Lab Results  Component Value Date   WBC 6.8 01/15/2019   NEUTROABS 6.0 03/07/2017   HGB 7.4 (L) 01/15/2019   HCT 24.3 (L) 01/15/2019   MCV 88.0 01/15/2019   PLT 306 01/15/2019  STUDIES: No results found.  ASSESSMENT: Anemia, unspecified.  PLAN:    1.  Anemia, unspecified: Patient's hemoglobin is significantly reduced at 7.4.  Her reticulocyte count is inappropriately normal.  Erythropoietin level is mildly elevated.  The remainder of her laboratory work including iron stores, B12, folate, and hemolysis labs are all either negative or within normal limits.  ANA is also negative.  Given her lack of renal insufficiency and inappropriately normal reticulocyte count, she possibly has an underlying bone marrow disorder.  Patient may benefit from a bone marrow biopsy in the near future.  This was mentioned to the patient and she is hesitant to undergo this procedure.  She may benefit from Retacrit.  Return to clinic in 2 weeks for further evaluation and initiation of treatment.  I spent a total of 45 minutes face-to-face with the patient of which greater than 50% of the visit was spent in counseling and coordination of care as detailed above.  Patient expressed understanding and was in agreement with this plan. She also understands that She can call clinic at any time with any questions, concerns, or complaints.     Lloyd Huger, MD   01/17/2019 9:27 AM

## 2019-01-18 LAB — PROTEIN ELECTROPHORESIS, SERUM
A/G Ratio: 1.3 (ref 0.7–1.7)
Albumin ELP: 3.8 g/dL (ref 2.9–4.4)
Alpha-1-Globulin: 0.3 g/dL (ref 0.0–0.4)
Alpha-2-Globulin: 0.8 g/dL (ref 0.4–1.0)
Beta Globulin: 1 g/dL (ref 0.7–1.3)
Gamma Globulin: 0.8 g/dL (ref 0.4–1.8)
Globulin, Total: 3 g/dL (ref 2.2–3.9)
Total Protein ELP: 6.8 g/dL (ref 6.0–8.5)

## 2019-01-24 NOTE — Progress Notes (Signed)
[place]  [phone] [phone]  ID: Virginia [name] OB: 11-03-1950  MR#: [id number]  UUE#:[id number]  Patient Care Team: Idelle Crouch, MD as PCP - General (Internal Medicine)  CHIEF COMPLAINT: Anemia, unspecified  INTERVAL HISTORY: Patient returns to clinic today for repeat laboratory work, further evaluation, and continuation of Retacrit.  She continues to have chronic weakness and fatigue.  She also has chronic shortness of breath which requires oxygen 24 hours/day. She has no neurologic complaints.  She denies any recent fevers or illnesses.  She has a good appetite and denies weight loss.  She has no chest pain, shortness of breath, cough, or hemoptysis.  She denies any nausea, vomiting, constipation, or diarrhea.  She has no melena or hematochezia.  She has no urinary complaints.  Patient offers no further specific complaints today.  REVIEW OF SYSTEMS:   Review of Systems  Constitutional: Positive for malaise/fatigue. Negative for fever and weight loss.  Respiratory: Positive for shortness of breath. Negative for cough and hemoptysis.   Cardiovascular: Negative.  Negative for chest pain and leg swelling.  Gastrointestinal: Negative.  Negative for abdominal pain, blood in stool and melena.  Genitourinary: Negative.  Negative for hematuria.  Musculoskeletal: Negative.  Negative for back pain.  Skin: Negative.  Negative for rash.  Neurological: Positive for weakness. Negative for dizziness, focal weakness and headaches.  Psychiatric/Behavioral: Negative.  The patient is not nervous/anxious.     As per HPI. Otherwise, a complete review of systems is negative.  PAST MEDICAL HISTORY: Past Medical History:  Diagnosis Date  . Anemia   . Anxiety   . Arthritis   . Atrial fibrillation ([place])   . C. difficile colitis 09/2015  . COPD (chronic obstructive pulmonary disease) ([place])   . Depression   . Dyspnea   . Dysrhythmia   .  Fibromyalgia   . H/O tracheostomy   . Hep C w/ coma, chronic ([place])   . Hypertension   . MRSA pneumonia ([place]) 2017  . On home oxygen therapy    3 L/M   . Osteoporosis   . Peripheral neuropathy   . RLS (restless legs syndrome)   . S/P percutaneous endoscopic gastrostomy (PEG) tube placement ([place]) 09/2015  . Ventilator associated pneumonia [facility]) 10/2015   [facility], [place]    PAST SURGICAL HISTORY: Past Surgical History:  Procedure Laterality Date  . ABDOMINAL HYSTERECTOMY    . BREAST SURGERY Bilateral    Breast Implants  . DILATION AND CURETTAGE OF UTERUS    . REVERSE SHOULDER ARTHROPLASTY Right 03/06/2017   Procedure: REVERSE SHOULDER ARTHROPLASTY;  Surgeon: Corky Mull, MD;  Location: [facility] ORS;  Service: Orthopedics;  Laterality: Right;  . ROTATOR CUFF REPAIR Bilateral     FAMILY HISTORY: Family History  Problem Relation Age of Onset  . Hypertension Mother     ADVANCED DIRECTIVES (Y/N):  N  HEALTH MAINTENANCE: Social History   Tobacco Use  . Smoking status: Former Smoker    Packs/day: 1.50    Types: Cigarettes    Quit date: 10/05/2015    Years since quitting: 3.3  . Smokeless tobacco: Never Used  Substance Use Topics  . Alcohol use: No  . Drug use: No     Colonoscopy:  PAP:  Bone density:  Lipid panel:  No Known Allergies  Current Outpatient Medications  Medication Sig Dispense Refill  . albuterol (PROVENTIL) (2.5 MG/3ML) 0.083% nebulizer solution Take 2.5 mg by nebulization See admin instructions. Uses three times a  day. Uses a fourth time if needed for shortness of breath.    Marland Kitchen amitriptyline (ELAVIL) 25 MG tablet Take 25 mg by mouth at bedtime.     Marland Kitchen apixaban (ELIQUIS) 5 MG TABS tablet Take 1 tablet (5 mg total) by mouth 2 (two) times daily. 60 tablet 0  . diazepam (VALIUM) 5 MG tablet Take 5 mg by mouth every 8 (eight) hours as needed for anxiety.   5  . diltiazem (CARDIZEM) 30 MG tablet     . estradiol (ESTRACE) 1 MG tablet Take 1 mg by  mouth daily.  1  . Fluticasone-Umeclidin-Vilant 100-62.5-25 MCG/INH AEPB Inhale into the lungs.    . furosemide (LASIX) 20 MG tablet Take 20 mg by mouth daily.   1  . gabapentin (NEURONTIN) 100 MG capsule Take 100 mg by mouth 3 (three) times daily.     Marland Kitchen HYDROcodone-acetaminophen (NORCO) 10-325 MG tablet TK 1 T PO Q 6 H PRN P    . lisinopril (ZESTRIL) 10 MG tablet TK 1 T PO ONCE D    . MAGNESIUM-OXIDE 400 (241.3 Mg) MG tablet Take 400 mg by mouth daily.   1  . meloxicam (MOBIC) 7.5 MG tablet Take 7.5 mg by mouth daily.  1  . metoprolol tartrate (LOPRESSOR) 25 MG tablet Take 2 tablets (50 mg total) by mouth 2 (two) times daily. 60 tablet 0  . Multiple Vitamin (MULTIVITAMIN) tablet Take 1 tablet by mouth daily.    Marland Kitchen oxyCODONE (OXY IR/ROXICODONE) 5 MG immediate release tablet Take 1-2 tablets (5-10 mg total) by mouth every 4 (four) hours as needed for breakthrough pain. 60 tablet 0  . PROAIR HFA 108 (90 Base) MCG/ACT inhaler Inhale 2 puffs into the lungs every 6 (six) hours as needed for wheezing or shortness of breath.   11  . QUEtiapine (SEROQUEL) 25 MG tablet Take 25 mg by mouth at bedtime.   1   No current facility-administered medications for this visit.     OBJECTIVE: Vitals:   01/29/19 1442  BP: 126/74  Pulse: 68  Temp: 98.1 F (36.7 C)  SpO2: 99%     Body mass index is 19.92 kg/m.    ECOG FS:1 - Symptomatic but completely ambulatory  General: Thin, mild respiratory distress. Eyes: Pink conjunctiva, anicteric sclera. HEENT: Normocephalic, moist mucous membranes. Lungs: Clear to auscultation bilaterally. Heart: Regular rate and rhythm. No rubs, murmurs, or gallops. Abdomen: Soft, nontender, nondistended. No organomegaly noted, normoactive bowel sounds. Musculoskeletal: No edema, cyanosis, or clubbing. Neuro: Alert, answering all questions appropriately. Cranial nerves grossly intact. Skin: No rashes or petechiae noted. Psych: Normal affect.  LAB RESULTS:  Lab Results   Component Value Date   NA 134 (L) 06/24/2017   K 3.9 06/24/2017   CL 97 (L) 06/24/2017   CO2 33 (H) 06/24/2017   GLUCOSE 67 06/24/2017   BUN 15 06/24/2017   CREATININE 0.94 06/24/2017   CALCIUM 8.5 (L) 06/24/2017   PROT 7.2 11/09/2016   ALBUMIN 4.1 11/09/2016   AST 27 11/09/2016   ALT 19 11/09/2016   ALKPHOS 18 (L) 11/09/2016   BILITOT 0.5 11/09/2016   GFRNONAA >60 06/24/2017   GFRAA >60 06/24/2017    Lab Results  Component Value Date   WBC 7.2 01/29/2019   NEUTROABS 5.3 01/29/2019   HGB 7.1 (L) 01/29/2019   HCT 23.6 (L) 01/29/2019   MCV 87.7 01/29/2019   PLT 236 01/29/2019     STUDIES: No results found.  ASSESSMENT: Anemia, unspecified.  PLAN:  1.  Anemia, unspecified: Patient's hemoglobin remained significantly decreased at 7.1.  Previously, her reticulocyte count is inappropriately normal and erythropoietin level was only mildly elevated.  The remainder of her laboratory work including iron stores, B12, folate, and hemolysis labs are all either negative or within normal limits.  ANA is also negative.  Given her lack of renal insufficiency and inappropriately normal reticulocyte count, she possibly has an underlying bone marrow disorder.  Patient may benefit from a bone marrow biopsy in the near future.  Will also order peripheral blood extended MDS panel.  Proceed with Retacrit today.  Return to clinic every 4 weeks for laboratory work and reticulocyte rate if her hemoglobin remains below 10.0.  Patient will then return to clinic in 4 months with repeat laboratory work, further evaluation, and additional discussion about possible bone marrow biopsy.    I spent a total of 30 minutes face-to-face with the patient of which greater than 50% of the visit was spent in counseling and coordination of care as detailed above.   Patient expressed understanding and was in agreement with this plan. She also understands that She can call clinic at any time with any questions,  concerns, or complaints.     Lloyd Huger, MD   01/30/2019 8:55 AM

## 2019-01-28 ENCOUNTER — Other Ambulatory Visit: Payer: Self-pay

## 2019-01-29 ENCOUNTER — Inpatient Hospital Stay: Payer: Medicare Other

## 2019-01-29 ENCOUNTER — Inpatient Hospital Stay: Payer: Medicare Other | Attending: Oncology | Admitting: Oncology

## 2019-01-29 ENCOUNTER — Encounter: Payer: Self-pay | Admitting: Oncology

## 2019-01-29 ENCOUNTER — Other Ambulatory Visit: Payer: Self-pay

## 2019-01-29 VITALS — BP 110/63 | HR 68

## 2019-01-29 VITALS — BP 126/74 | HR 68 | Temp 98.1°F | Wt 102.0 lb

## 2019-01-29 DIAGNOSIS — I10 Essential (primary) hypertension: Secondary | ICD-10-CM | POA: Insufficient documentation

## 2019-01-29 DIAGNOSIS — R0602 Shortness of breath: Secondary | ICD-10-CM | POA: Diagnosis not present

## 2019-01-29 DIAGNOSIS — Z87891 Personal history of nicotine dependence: Secondary | ICD-10-CM | POA: Insufficient documentation

## 2019-01-29 DIAGNOSIS — I4891 Unspecified atrial fibrillation: Secondary | ICD-10-CM | POA: Diagnosis not present

## 2019-01-29 DIAGNOSIS — D649 Anemia, unspecified: Secondary | ICD-10-CM

## 2019-01-29 DIAGNOSIS — B182 Chronic viral hepatitis C: Secondary | ICD-10-CM | POA: Diagnosis not present

## 2019-01-29 DIAGNOSIS — Z9981 Dependence on supplemental oxygen: Secondary | ICD-10-CM | POA: Insufficient documentation

## 2019-01-29 DIAGNOSIS — Z79899 Other long term (current) drug therapy: Secondary | ICD-10-CM | POA: Diagnosis not present

## 2019-01-29 LAB — CBC WITH DIFFERENTIAL/PLATELET
Abs Immature Granulocytes: 0.01 10*3/uL (ref 0.00–0.07)
Basophils Absolute: 0 10*3/uL (ref 0.0–0.1)
Basophils Relative: 0 %
Eosinophils Absolute: 0.3 10*3/uL (ref 0.0–0.5)
Eosinophils Relative: 4 %
HCT: 23.6 % — ABNORMAL LOW (ref 36.0–46.0)
Hemoglobin: 7.1 g/dL — ABNORMAL LOW (ref 12.0–15.0)
Immature Granulocytes: 0 %
Lymphocytes Relative: 13 %
Lymphs Abs: 1 10*3/uL (ref 0.7–4.0)
MCH: 26.4 pg (ref 26.0–34.0)
MCHC: 30.1 g/dL (ref 30.0–36.0)
MCV: 87.7 fL (ref 80.0–100.0)
Monocytes Absolute: 0.6 10*3/uL (ref 0.1–1.0)
Monocytes Relative: 8 %
Neutro Abs: 5.3 10*3/uL (ref 1.7–7.7)
Neutrophils Relative %: 75 %
Platelets: 236 10*3/uL (ref 150–400)
RBC: 2.69 MIL/uL — ABNORMAL LOW (ref 3.87–5.11)
RDW: 14.6 % (ref 11.5–15.5)
WBC: 7.2 10*3/uL (ref 4.0–10.5)
nRBC: 0 % (ref 0.0–0.2)

## 2019-01-29 MED ORDER — EPOETIN ALFA-EPBX 40000 UNIT/ML IJ SOLN
40000.0000 [IU] | Freq: Once | INTRAMUSCULAR | Status: AC
  Administered 2019-01-29: 40000 [IU] via SUBCUTANEOUS
  Filled 2019-01-29: qty 1

## 2019-01-29 NOTE — Progress Notes (Signed)
Patient stated that she had been doing well. 

## 2019-03-02 ENCOUNTER — Other Ambulatory Visit: Payer: Self-pay

## 2019-03-02 DIAGNOSIS — D649 Anemia, unspecified: Secondary | ICD-10-CM

## 2019-03-03 ENCOUNTER — Other Ambulatory Visit: Payer: Self-pay

## 2019-03-03 ENCOUNTER — Inpatient Hospital Stay: Payer: Medicare Other

## 2019-03-03 ENCOUNTER — Inpatient Hospital Stay: Payer: Medicare Other | Attending: Oncology

## 2019-03-03 VITALS — BP 121/75 | HR 70

## 2019-03-03 DIAGNOSIS — D649 Anemia, unspecified: Secondary | ICD-10-CM

## 2019-03-03 DIAGNOSIS — D631 Anemia in chronic kidney disease: Secondary | ICD-10-CM | POA: Insufficient documentation

## 2019-03-03 DIAGNOSIS — N189 Chronic kidney disease, unspecified: Secondary | ICD-10-CM | POA: Diagnosis present

## 2019-03-03 LAB — CBC WITH DIFFERENTIAL/PLATELET
Abs Immature Granulocytes: 0.01 10*3/uL (ref 0.00–0.07)
Basophils Absolute: 0 10*3/uL (ref 0.0–0.1)
Basophils Relative: 0 %
Eosinophils Absolute: 0.4 10*3/uL (ref 0.0–0.5)
Eosinophils Relative: 5 %
HCT: 22.9 % — ABNORMAL LOW (ref 36.0–46.0)
Hemoglobin: 6.7 g/dL — ABNORMAL LOW (ref 12.0–15.0)
Immature Granulocytes: 0 %
Lymphocytes Relative: 17 %
Lymphs Abs: 1.2 10*3/uL (ref 0.7–4.0)
MCH: 25.4 pg — ABNORMAL LOW (ref 26.0–34.0)
MCHC: 29.3 g/dL — ABNORMAL LOW (ref 30.0–36.0)
MCV: 86.7 fL (ref 80.0–100.0)
Monocytes Absolute: 0.6 10*3/uL (ref 0.1–1.0)
Monocytes Relative: 8 %
Neutro Abs: 4.7 10*3/uL (ref 1.7–7.7)
Neutrophils Relative %: 70 %
Platelets: 295 10*3/uL (ref 150–400)
RBC: 2.64 MIL/uL — ABNORMAL LOW (ref 3.87–5.11)
RDW: 15.9 % — ABNORMAL HIGH (ref 11.5–15.5)
WBC: 6.8 10*3/uL (ref 4.0–10.5)
nRBC: 0 % (ref 0.0–0.2)

## 2019-03-03 LAB — SAMPLE TO BLOOD BANK

## 2019-03-03 LAB — ABO/RH: ABO/RH(D): B POS

## 2019-03-03 MED ORDER — EPOETIN ALFA-EPBX 40000 UNIT/ML IJ SOLN
40000.0000 [IU] | Freq: Once | INTRAMUSCULAR | Status: AC
  Administered 2019-03-03: 40000 [IU] via SUBCUTANEOUS
  Filled 2019-03-03: qty 1

## 2019-03-04 ENCOUNTER — Other Ambulatory Visit: Payer: Self-pay | Admitting: Oncology

## 2019-03-04 ENCOUNTER — Other Ambulatory Visit: Payer: Self-pay | Admitting: *Deleted

## 2019-03-04 DIAGNOSIS — D649 Anemia, unspecified: Secondary | ICD-10-CM

## 2019-03-04 LAB — PREPARE RBC (CROSSMATCH)

## 2019-03-05 ENCOUNTER — Ambulatory Visit
Admission: RE | Admit: 2019-03-05 | Discharge: 2019-03-05 | Disposition: A | Discharge status: Home / Self Care | Payer: Medicare Other | Source: Ambulatory Visit | Attending: Nephrology | Admitting: Nephrology

## 2019-03-05 ENCOUNTER — Other Ambulatory Visit: Payer: Self-pay

## 2019-03-05 DIAGNOSIS — D649 Anemia, unspecified: Secondary | ICD-10-CM | POA: Insufficient documentation

## 2019-03-05 DIAGNOSIS — N189 Chronic kidney disease, unspecified: Secondary | ICD-10-CM | POA: Diagnosis not present

## 2019-03-05 MED ORDER — DIPHENHYDRAMINE HCL 50 MG/ML IJ SOLN
INTRAMUSCULAR | Status: AC
  Administered 2019-03-05: 25 mg via INTRAVENOUS
  Filled 2019-03-05: qty 1

## 2019-03-05 MED ORDER — ACETAMINOPHEN 500 MG PO TABS
ORAL_TABLET | ORAL | Status: AC
  Filled 2019-03-05: qty 2

## 2019-03-05 MED ORDER — DIPHENHYDRAMINE HCL 50 MG/ML IJ SOLN
25.0000 mg | Freq: Once | INTRAMUSCULAR | Status: AC
  Administered 2019-03-05: 13:00:00 25 mg via INTRAVENOUS

## 2019-03-05 MED ORDER — SODIUM CHLORIDE 0.9% IV SOLUTION: 250.0000 mL | Freq: Once | INTRAVENOUS | Status: DC

## 2019-03-05 MED ORDER — ACETAMINOPHEN 325 MG PO TABS
ORAL_TABLET | ORAL | Status: AC
  Administered 2019-03-05: 650 mg via ORAL
  Filled 2019-03-05: qty 2

## 2019-03-05 MED ORDER — ACETAMINOPHEN 325 MG PO TABS
650.0000 mg | ORAL_TABLET | Freq: Once | ORAL | Status: AC
  Administered 2019-03-05: 13:00:00 650 mg via ORAL

## 2019-03-06 LAB — BPAM RBC
Blood Product Expiration Date: 202010022359
ISSUE DATE / TIME: 202009111302
Unit Type and Rh: 7300

## 2019-03-06 LAB — TYPE AND SCREEN
ABO/RH(D): B POS
Antibody Screen: NEGATIVE
Unit division: 0

## 2019-03-08 ENCOUNTER — Ambulatory Visit: Payer: Medicare Other

## 2019-03-08 ENCOUNTER — Inpatient Hospital Stay: Payer: Medicare Other

## 2019-03-29 ENCOUNTER — Inpatient Hospital Stay: Payer: Medicare Other

## 2019-04-02 ENCOUNTER — Other Ambulatory Visit: Payer: Self-pay

## 2019-04-02 ENCOUNTER — Inpatient Hospital Stay: Payer: Medicare Other

## 2019-04-02 DIAGNOSIS — D649 Anemia, unspecified: Secondary | ICD-10-CM

## 2019-04-05 ENCOUNTER — Inpatient Hospital Stay: Payer: Medicare Other | Attending: Oncology

## 2019-04-05 ENCOUNTER — Inpatient Hospital Stay: Payer: Medicare Other

## 2019-04-05 ENCOUNTER — Other Ambulatory Visit: Payer: Self-pay

## 2019-04-05 DIAGNOSIS — D649 Anemia, unspecified: Secondary | ICD-10-CM | POA: Diagnosis present

## 2019-04-05 DIAGNOSIS — N289 Disorder of kidney and ureter, unspecified: Secondary | ICD-10-CM | POA: Insufficient documentation

## 2019-04-05 LAB — CBC WITH DIFFERENTIAL/PLATELET
Abs Immature Granulocytes: 0.03 10*3/uL (ref 0.00–0.07)
Basophils Absolute: 0.1 10*3/uL (ref 0.0–0.1)
Basophils Relative: 1 %
Eosinophils Absolute: 0.3 10*3/uL (ref 0.0–0.5)
Eosinophils Relative: 4 %
HCT: 27.5 % — ABNORMAL LOW (ref 36.0–46.0)
Hemoglobin: 8 g/dL — ABNORMAL LOW (ref 12.0–15.0)
Immature Granulocytes: 0 %
Lymphocytes Relative: 17 %
Lymphs Abs: 1.2 10*3/uL (ref 0.7–4.0)
MCH: 25.4 pg — ABNORMAL LOW (ref 26.0–34.0)
MCHC: 29.1 g/dL — ABNORMAL LOW (ref 30.0–36.0)
MCV: 87.3 fL (ref 80.0–100.0)
Monocytes Absolute: 0.7 10*3/uL (ref 0.1–1.0)
Monocytes Relative: 9 %
Neutro Abs: 5 10*3/uL (ref 1.7–7.7)
Neutrophils Relative %: 69 %
Platelets: 231 10*3/uL (ref 150–400)
RBC: 3.15 MIL/uL — ABNORMAL LOW (ref 3.87–5.11)
RDW: 16.8 % — ABNORMAL HIGH (ref 11.5–15.5)
WBC: 7.3 10*3/uL (ref 4.0–10.5)
nRBC: 0 % (ref 0.0–0.2)

## 2019-04-05 LAB — IRON AND TIBC
Iron: 65 ug/dL (ref 28–170)
Saturation Ratios: 20 % (ref 10.4–31.8)
TIBC: 324 ug/dL (ref 250–450)
UIBC: 259 ug/dL

## 2019-04-05 LAB — FERRITIN: Ferritin: 17 ng/mL (ref 11–307)

## 2019-04-05 MED ORDER — EPOETIN ALFA-EPBX 40000 UNIT/ML IJ SOLN
40000.0000 [IU] | Freq: Once | INTRAMUSCULAR | Status: AC
  Administered 2019-04-05: 15:00:00 40000 [IU] via SUBCUTANEOUS

## 2019-04-20 ENCOUNTER — Emergency Department: Payer: Medicare Other

## 2019-04-20 ENCOUNTER — Emergency Department
Admission: EM | Admit: 2019-04-20 | Discharge: 2019-04-21 | Disposition: A | Discharge status: Home / Self Care | Payer: Medicare Other | Attending: Emergency Medicine | Admitting: Emergency Medicine

## 2019-04-20 ENCOUNTER — Other Ambulatory Visit: Payer: Self-pay

## 2019-04-20 DIAGNOSIS — J40 Bronchitis, not specified as acute or chronic: Secondary | ICD-10-CM | POA: Diagnosis not present

## 2019-04-20 DIAGNOSIS — Z79899 Other long term (current) drug therapy: Secondary | ICD-10-CM | POA: Diagnosis not present

## 2019-04-20 DIAGNOSIS — Z7901 Long term (current) use of anticoagulants: Secondary | ICD-10-CM | POA: Diagnosis not present

## 2019-04-20 DIAGNOSIS — Z87891 Personal history of nicotine dependence: Secondary | ICD-10-CM | POA: Diagnosis not present

## 2019-04-20 DIAGNOSIS — J449 Chronic obstructive pulmonary disease, unspecified: Secondary | ICD-10-CM | POA: Diagnosis not present

## 2019-04-20 DIAGNOSIS — I10 Essential (primary) hypertension: Secondary | ICD-10-CM | POA: Diagnosis not present

## 2019-04-20 DIAGNOSIS — R0602 Shortness of breath: Secondary | ICD-10-CM | POA: Diagnosis present

## 2019-04-20 LAB — CBC WITH DIFFERENTIAL/PLATELET
Abs Immature Granulocytes: 0.02 10*3/uL (ref 0.00–0.07)
Basophils Absolute: 0.1 10*3/uL (ref 0.0–0.1)
Basophils Relative: 1 %
Eosinophils Absolute: 0.2 10*3/uL (ref 0.0–0.5)
Eosinophils Relative: 3 %
HCT: 28.4 % — ABNORMAL LOW (ref 36.0–46.0)
Hemoglobin: 8.1 g/dL — ABNORMAL LOW (ref 12.0–15.0)
Immature Granulocytes: 0 %
Lymphocytes Relative: 13 %
Lymphs Abs: 1.1 10*3/uL (ref 0.7–4.0)
MCH: 24.4 pg — ABNORMAL LOW (ref 26.0–34.0)
MCHC: 28.5 g/dL — ABNORMAL LOW (ref 30.0–36.0)
MCV: 85.5 fL (ref 80.0–100.0)
Monocytes Absolute: 0.7 10*3/uL (ref 0.1–1.0)
Monocytes Relative: 9 %
Neutro Abs: 5.9 10*3/uL (ref 1.7–7.7)
Neutrophils Relative %: 74 %
Platelets: 269 10*3/uL (ref 150–400)
RBC: 3.32 MIL/uL — ABNORMAL LOW (ref 3.87–5.11)
RDW: 17.1 % — ABNORMAL HIGH (ref 11.5–15.5)
WBC: 8 10*3/uL (ref 4.0–10.5)
nRBC: 0 % (ref 0.0–0.2)

## 2019-04-20 MED ORDER — METHYLPREDNISOLONE SODIUM SUCC 125 MG IJ SOLR
125.0000 mg | Freq: Once | INTRAMUSCULAR | Status: AC
  Administered 2019-04-20: 125 mg via INTRAVENOUS
  Filled 2019-04-20: qty 2

## 2019-04-20 MED ORDER — IPRATROPIUM-ALBUTEROL 0.5-2.5 (3) MG/3ML IN SOLN
3.0000 mL | Freq: Once | RESPIRATORY_TRACT | Status: AC
  Administered 2019-04-20: 3 mL via RESPIRATORY_TRACT
  Filled 2019-04-20: qty 9

## 2019-04-20 MED ORDER — IPRATROPIUM-ALBUTEROL 0.5-2.5 (3) MG/3ML IN SOLN
3.0000 mL | Freq: Once | RESPIRATORY_TRACT | Status: AC
  Administered 2019-04-20: 3 mL via RESPIRATORY_TRACT

## 2019-04-20 NOTE — ED Provider Notes (Signed)
[facility] Emergency Department Provider Note  ____________________________________________  Time seen: Approximately 11:44 PM  I have reviewed the triage vital signs and the nursing notes.   HISTORY  Chief Complaint Shortness of Breath   HPI Virginia [name] is a 68 y.o. female history of COPD on 3 L nasal cannula, anemia requiring transfusions, A. fib on Eliquis, hepatitis C, hypertension, CHF with EF 50% who presents for evaluation of shortness of breath.  Patient reports progressively worsening shortness of breath over the last 2 days.  She reports that this morning walking to and from her bathroom from her bed it took her 2 hours to be able to catch her breath.  Has been using 4 L of oxygen at home during the day.  She is complaining of chest tightness which is present with exertion and resolves at rest.  Worsening chronic cough no productive of yellow phlegm.  No fever or chills, no body aches, no sore throat, no nausea, no vomiting, no diarrhea, no leg pain or swelling, no exogenous hormones, no personal or family history of blood clots, no hemoptysis, no recent travel immobilization.   Patient no longer smokes and has been using her inhalers  Past Medical History:  Diagnosis Date  . Anemia   . Anxiety   . Arthritis   . Atrial fibrillation ([facility])   . C. difficile colitis 09/2015  . COPD (chronic obstructive pulmonary disease) ([facility])   . Depression   . Dyspnea   . Dysrhythmia   . Fibromyalgia   . H/O tracheostomy   . Hep C w/ coma, chronic ([facility])   . Hypertension   . MRSA pneumonia ([facility]) 2017  . On home oxygen therapy    3 L/M   . Osteoporosis   . Peripheral neuropathy   . RLS (restless legs syndrome)   . S/P percutaneous endoscopic gastrostomy (PEG) tube placement ([facility]) 09/2015  . Ventilator associated pneumonia [facility] 10/2015   [facility] [place]    Patient Active Problem List   Diagnosis Date Noted  . Anemia 01/17/2019  . 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 A-fib ([facility]) 11/09/2016    Past Surgical History:  Procedure Laterality Date  . ABDOMINAL HYSTERECTOMY    . BREAST SURGERY Bilateral    Breast Implants  . DILATION AND CURETTAGE OF UTERUS    . REVERSE SHOULDER ARTHROPLASTY Right 03/06/2017   Procedure: REVERSE SHOULDER ARTHROPLASTY;  Surgeon: Christena FlakePoggi, John J, MD;  Location: [facility] ORS;  Service: Orthopedics;  Laterality: Right;  . ROTATOR CUFF REPAIR Bilateral     Prior to Admission medications   Medication Sig Start Date End Date Taking? Authorizing Provider  albuterol (PROVENTIL) (2.5 MG/3ML) 0.083% nebulizer solution Take 2.5 mg by nebulization See admin instructions. Uses three times a day. Uses a fourth time if needed for shortness of breath.    [provider]  amitriptyline (ELAVIL) 25 MG tablet Take 25 mg by mouth at bedtime.  01/14/19   [provider]  apixaban (ELIQUIS) 5 MG TABS tablet Take 1 tablet (5 mg total) by mouth 2 (two) times daily. 11/10/16   Adrian SaranMody, [place], MD  azithromycin (ZITHROMAX) 250 MG tablet Take 1 a day for 4 days 04/21/19   Don PerkingVeronese, [place], MD  diazepam (VALIUM) 5 MG tablet Take 5 mg by mouth every 8 (eight) hours as needed for anxiety.  10/22/16   [provider]  diltiazem (CARDIZEM) 30 MG tablet  11/22/18   [provider]  estradiol (ESTRACE) 1 MG tablet Take 1 mg by mouth daily. 09/10/16   [provider]  Fluticasone-Umeclidin-Vilant 100-62.5-25 MCG/INH AEPB Inhale into the lungs. 12/01/18   [provider]  furosemide (LASIX) 20 MG tablet Take 20 mg by mouth daily.  10/01/16   [provider]  gabapentin (NEURONTIN) 100 MG capsule Take 100 mg by mouth 3 (three) times daily.  01/06/19   [provider]  HYDROcodone-acetaminophen (NORCO) 10-325 MG tablet TK 1 T PO Q 6 H PRN P 12/23/18   [provider]  lisinopril (ZESTRIL) 10 MG tablet TK 1 T PO ONCE D 12/16/18   [provider]  MAGNESIUM-OXIDE 400 (241.3 Mg) MG tablet Take 400 mg by mouth daily.  08/24/16   [provider]  meloxicam (MOBIC) 7.5 MG tablet Take 7.5 mg by mouth daily. 10/01/16   [provider]  metoprolol tartrate (LOPRESSOR) 25 MG tablet Take 2 tablets (50 mg total) by mouth 2 (two) times daily. 11/10/16   Adrian Saran, MD  Multiple Vitamin (MULTIVITAMIN) tablet Take 1 tablet by mouth daily.    [provider]  oxyCODONE (OXY IR/ROXICODONE) 5 MG immediate release tablet Take 1-2 tablets (5-10 mg total) by mouth every 4 (four) hours as needed for breakthrough pain. 03/07/17   Anson Oregon, PA-C  predniSONE (DELTASONE) 20 MG tablet Take 3 tablets (60 mg total) by mouth daily for 4 days. 04/21/19 04/25/19  Nita Sickle, MD  PROAIR HFA 108 [phone] Base) MCG/ACT inhaler Inhale 2 puffs into the lungs every 6 (six) hours as needed for wheezing or shortness of breath.  10/23/16   [provider]  QUEtiapine (SEROQUEL) 25 MG tablet Take 25 mg by mouth at bedtime.  10/01/16   [provider]    Allergies Patient has no known allergies.  Family History  Problem Relation Age of Onset  . Hypertension Mother     Social History Social History   Tobacco Use  . Smoking status: Former Smoker    Packs/day: 1.50    Types: Cigarettes    Quit date: 10/05/2015    Years since quitting: 3.5  . Smokeless tobacco: Never Used  Substance Use Topics  . Alcohol use: No  . Drug use: No    Review of Systems  Constitutional: Negative for fever. Eyes: Negative for visual changes. ENT: Negative for sore throat. Neck: No neck pain  Cardiovascular: Negative for chest pain. Respiratory: + shortness of breath and cough Gastrointestinal: Negative for abdominal pain, vomiting or diarrhea. Genitourinary: Negative for dysuria. Musculoskeletal: Negative for back pain. Skin: Negative for rash. Neurological: Negative for headaches, weakness or numbness. Psych: No  SI or HI  ____________________________________________   PHYSICAL EXAM:  VITAL SIGNS: ED Triage Vitals  Enc Vitals Group     BP 04/20/19 2319 (!) 136/101     Pulse Rate 04/20/19 2319 68     Resp 04/20/19 2319 18     Temp 04/20/19 2319 98.1 F (36.7 C)     Temp Source 04/20/19 2319 Oral     SpO2 04/20/19 2315 97 %     Weight 04/20/19 2321 104 lb (47.2 kg)     Height 04/20/19 2321 5\' 2"  (1.575 m)     Head Circumference --      Peak Flow --      Pain Score 04/20/19 2320 8     Pain Loc --      Pain Edu? --      Excl. in GC? --  Constitutional: Alert and oriented, cachectic chronic ill appearing, increased WOB HEENT:      Head: Normocephalic and atraumatic.         Eyes: Conjunctivae are normal. Sclera is non-icteric.       Mouth/Throat: Mucous membranes are moist.       Neck: Supple with no signs of meningismus. Cardiovascular: Regular rate and rhythm. No murmurs, gallops, or rubs. 2+ symmetrical distal pulses are present in all extremities. No JVD. Respiratory: Increased work of breathing, satting 100% on 3 L, severely diminished air movement bilaterally with no crackles or wheezes  gastrointestinal: Soft, non tender, and non distended with positive bowel sounds. No rebound or guarding. Musculoskeletal: Trace edema bilateral Neurologic: Normal speech and language. Face is symmetric. Moving all extremities. No gross focal neurologic deficits are appreciated. Skin: Skin is warm, dry and intact. No rash noted. Psychiatric: Mood and affect are normal. Speech and behavior are normal.  ____________________________________________   LABS (all labs ordered are listed, but only abnormal results are displayed)  Labs Reviewed  CBC WITH DIFFERENTIAL/PLATELET - Abnormal; Notable for the following components:      Result Value   RBC 3.32 (*)    Hemoglobin 8.1 (*)    HCT 28.4 (*)    MCH 24.4 (*)    MCHC 28.5 (*)    RDW 17.1 (*)    All other components within normal limits   BASIC METABOLIC PANEL - Abnormal; Notable for the following components:   Chloride 94 (*)    CO2 33 (*)    Glucose, Bld 103 (*)    All other components within normal limits  TROPONIN I (HIGH SENSITIVITY)   ____________________________________________  EKG  ED ECG REPORT I, Rudene Re, the attending physician, personally viewed and interpreted this ECG.  Normal sinus rhythm, rate of 61, normal intervals, normal axis, no ST elevations or depressions, anterior Q waves.  Unchanged from prior. ____________________________________________  RADIOLOGY  I have personally reviewed the images performed during this visit and I agree with the Radiologist's read.   Interpretation by Radiologist:  Dg Chest Portable 1 View  Result Date: 04/20/2019 CLINICAL DATA:  68 year old female with shortness of breath EXAM: PORTABLE CHEST 1 VIEW COMPARISON:  Chest radiograph dated 06/24/2017 and CT dated 07/30/2017 FINDINGS: Background of emphysema. Diffuse chronic interstitial coarsening. Mild edema is not excluded. Small bilateral pleural effusions may be present. No focal consolidation or pneumothorax. Mild cardiomegaly. The aorta is tortuous. Osteopenia with degenerative changes of the spine. Right shoulder arthroplasty. No acute osseous pathology. IMPRESSION: 1. Emphysema. No focal consolidation or pneumothorax. 2. Possible small bilateral pleural effusions. 3. Mild cardiomegaly. Electronically Signed   By: Anner Crete M.D.   On: 04/20/2019 23:56      ____________________________________________   PROCEDURES  Procedure(s) performed: None Procedures Critical Care performed:  None ____________________________________________   INITIAL IMPRESSION / ASSESSMENT AND PLAN / ED COURSE   68 y.o. female history of COPD on 3 L nasal cannula, anemia requiring transfusions, A. fib on Eliquis, hepatitis C, hypertension, CHF with EF 50% who presents for evaluation of 2 days of worsening shortness  of breath and cough.  Patient with increased work of breathing, satting well on her baseline 3 L with severely diminished air movement bilaterally.  Differential diagnosis including COPD exacerbation versus bronchitis versus pneumonia versus pneumothorax versus worsening anemia versus CHF.  Less likely PE with no leg pain or swelling, no hemoptysis, no pleuritic chest pain and the fact the patient is on Eliquis.  With  no fever, no body aches, low suspicion for Covid or flu.  Will start with duo nebs x3 and Solu-Medrol.  EKG is unchanged from prior.  Will check labs and chest x-ray.   Clinical Course as of Apr 20 57  Wed Apr 21, 2019  1610 Patient feels improved, remained with normal sats on her baseline 3 L, currently at her baseline work of breathing.  Chest x-ray with no evidence of pneumonia.  Did show some cardiomegaly. Patient's last EF was 50%, she has not been taking her lasix Will give a dose of  IV lasix here. Z-pack and steroids for bronchitis. Hgb stable with no indication for transfusion at this time.  Recommended close follow-up with her pulmonologist.  Discussed my return precautions for worsening shortness of breath, chest pain, fever.   [CV]    Clinical Course User Index [CV] Don Perking [place], MD      As part of my medical decision making, I reviewed the following data within the electronic MEDICAL RECORD NUMBER Nursing notes reviewed and incorporated, Labs reviewed , EKG interpreted , Old EKG reviewed, Old chart reviewed, Radiograph reviewed , Notes from prior ED visits and [place] Controlled Substance Database   Patient was evaluated in Emergency Department today for the symptoms described in the history of present illness. Patient was evaluated in the context of the global COVID-19 pandemic, which necessitated consideration that the patient might be at risk for infection with the SARS-CoV-2 virus that causes COVID-19. Institutional protocols and algorithms that pertain to the  evaluation of patients at risk for COVID-19 are in a state of rapid change based on information released by regulatory bodies including the CDC and federal and state organizations. These policies and algorithms were followed during the patient's care in the ED.   ____________________________________________   FINAL CLINICAL IMPRESSION(S) / ED DIAGNOSES   Final diagnoses:  Bronchitis      NEW MEDICATIONS STARTED DURING THIS VISIT:  ED Discharge Orders         Ordered    predniSONE (DELTASONE) 20 MG tablet  Daily     04/21/19 0057    azithromycin (ZITHROMAX) 250 MG tablet     04/21/19 0057           Note:  This document was prepared using Dragon voice recognition software and may include unintentional dictation errors.    Don Perking, [place], MD 04/21/19 [phone]

## 2019-04-21 DIAGNOSIS — J40 Bronchitis, not specified as acute or chronic: Secondary | ICD-10-CM | POA: Diagnosis not present

## 2019-04-21 LAB — BASIC METABOLIC PANEL
Anion gap: 11 (ref 5–15)
BUN: 9 mg/dL (ref 8–23)
CO2: 33 mmol/L — ABNORMAL HIGH (ref 22–32)
Calcium: 9.1 mg/dL (ref 8.9–10.3)
Chloride: 94 mmol/L — ABNORMAL LOW (ref 98–111)
Creatinine, Ser: 0.7 mg/dL (ref 0.44–1.00)
GFR calc Af Amer: >60 mL/min (ref >60)
GFR calc non Af Amer: >60 mL/min (ref >60)
Glucose, Bld: 103 mg/dL — ABNORMAL HIGH (ref 70–99)
Potassium: 4.3 mmol/L (ref 3.5–5.1)
Sodium: 138 mmol/L (ref 135–145)

## 2019-04-21 LAB — TROPONIN I (HIGH SENSITIVITY): Troponin I (High Sensitivity): 4 ng/L (ref <18)

## 2019-04-21 MED ORDER — FUROSEMIDE 10 MG/ML IJ SOLN
20.0000 mg | Freq: Once | INTRAMUSCULAR | Status: AC
  Administered 2019-04-21: 20 mg via INTRAVENOUS
  Filled 2019-04-21: qty 4

## 2019-04-21 MED ORDER — PREDNISONE 20 MG PO TABS: 60.0000 mg | ORAL_TABLET | Freq: Every day | ORAL | 0 refills | Status: AC

## 2019-04-21 MED ORDER — AZITHROMYCIN 500 MG PO TABS
500.0000 mg | ORAL_TABLET | Freq: Once | ORAL | Status: AC
  Administered 2019-04-21: 500 mg via ORAL
  Filled 2019-04-21: qty 1

## 2019-04-21 MED ORDER — AZITHROMYCIN 250 MG PO TABS: ORAL_TABLET | ORAL | 0 refills | Status: DC

## 2019-04-21 NOTE — ED Notes (Signed)
Spoke with patient Daughter Dondra Spry) in law about discharge. Daughter in law is going to patient's home to pick up O2 tank and come and pick up patient.

## 2019-04-26 ENCOUNTER — Inpatient Hospital Stay: Payer: Medicare Other

## 2019-04-27 ENCOUNTER — Telehealth: Payer: Self-pay | Admitting: *Deleted

## 2019-04-27 NOTE — Telephone Encounter (Signed)
Patient called reporting that she has been hospitalized and that she was told to contact Dr Grayland Ormond regarding her anemia to see if she needs to be seen before her 05/10/19 appointment. Please advise.  Contains abnormal data CBC with Differential/Platelet Order: [id number] Status:  Final result Visible to patient:  No (not released) Next appt:  05/10/2019 at 02:00 PM in Oncology ([facility])  Ref Range & Units 7d ago 3wk ago 54mo ago  WBC 4.0 - 10.5 K/uL 8.0  7.3  6.8   RBC 3.87 - 5.11 MIL/uL 3.32Low   3.15Low   2.64Low    Hemoglobin 12.0 - 15.0 g/dL 8.1Low   8.0Low   6.7Low    HCT 36.0 - 46.0 % 28.4Low   27.5Low   22.9Low    MCV 80.0 - 100.0 fL 85.5  87.3  86.7   MCH 26.0 - 34.0 pg 24.4Low   25.4Low   25.4Low    MCHC 30.0 - 36.0 g/dL 28.5Low   29.1Low   29.3Low    RDW 11.5 - 15.5 % 17.1High   16.8High   15.9High    Platelets 150 - 400 K/uL 269  231  295   nRBC 0.0 - 0.2 % 0.0  0.0  0.0   Neutrophils Relative % % 74  69  70   Neutro Abs 1.7 - 7.7 K/uL 5.9  5.0  4.7   Lymphocytes Relative % 13  17  17    Lymphs Abs 0.7 - 4.0 K/uL 1.1  1.2  1.2   Monocytes Relative % 9  9  8    Monocytes Absolute 0.1 - 1.0 K/uL 0.7  0.7  0.6   Eosinophils Relative % 3  4  5    Eosinophils Absolute 0.0 - 0.5 K/uL 0.2  0.3  0.4   Basophils Relative % 1  1  0   Basophils Absolute 0.0 - 0.1 K/uL 0.1  0.1  0.0   Immature Granulocytes % 0  0  0   Abs Immature Granulocytes 0.00 - 0.07 K/uL 0.02  0.03 CM  0.01 CM   Comment: Performed at [facility], [address] [place], [place] [address]  Resulting Agency  [facility] CLIN LAB [facility] CLIN LAB [facility] LAB      Specimen Collected: 04/20/19 23:37 Last Resulted: 04/20/19 23:44

## 2019-04-27 NOTE — Telephone Encounter (Signed)
Called pt to schedule with no answer. Left pt a VM  To contact office so we can get her scheduled this week to Dr. Grayland Ormond.Will attempt later to reach pt again.

## 2019-04-27 NOTE — Telephone Encounter (Signed)
We can see this week with lab and retacrit.

## 2019-05-04 ENCOUNTER — Inpatient Hospital Stay: Payer: Medicare Other

## 2019-05-09 ENCOUNTER — Other Ambulatory Visit: Payer: Self-pay

## 2019-05-09 DIAGNOSIS — D649 Anemia, unspecified: Secondary | ICD-10-CM

## 2019-05-10 ENCOUNTER — Other Ambulatory Visit: Payer: Self-pay

## 2019-05-10 ENCOUNTER — Inpatient Hospital Stay: Payer: Medicare Other

## 2019-05-10 ENCOUNTER — Inpatient Hospital Stay: Payer: Medicare Other | Attending: Oncology

## 2019-05-10 VITALS — BP 135/82 | Temp 96.4°F

## 2019-05-10 DIAGNOSIS — D649 Anemia, unspecified: Secondary | ICD-10-CM

## 2019-05-10 LAB — CBC WITH DIFFERENTIAL/PLATELET
Abs Immature Granulocytes: 0.03 10*3/uL (ref 0.00–0.07)
Basophils Absolute: 0.1 10*3/uL (ref 0.0–0.1)
Basophils Relative: 1 %
Eosinophils Absolute: 0.1 10*3/uL (ref 0.0–0.5)
Eosinophils Relative: 2 %
HCT: 26.3 % — ABNORMAL LOW (ref 36.0–46.0)
Hemoglobin: 7.4 g/dL — ABNORMAL LOW (ref 12.0–15.0)
Immature Granulocytes: 0 %
Lymphocytes Relative: 8 %
Lymphs Abs: 0.7 10*3/uL (ref 0.7–4.0)
MCH: 25.4 pg — ABNORMAL LOW (ref 26.0–34.0)
MCHC: 28.1 g/dL — ABNORMAL LOW (ref 30.0–36.0)
MCV: 90.4 fL (ref 80.0–100.0)
Monocytes Absolute: 0.4 10*3/uL (ref 0.1–1.0)
Monocytes Relative: 4 %
Neutro Abs: 7.6 10*3/uL (ref 1.7–7.7)
Neutrophils Relative %: 85 %
Platelets: 190 10*3/uL (ref 150–400)
RBC: 2.91 MIL/uL — ABNORMAL LOW (ref 3.87–5.11)
RDW: 18.2 % — ABNORMAL HIGH (ref 11.5–15.5)
WBC: 8.9 10*3/uL (ref 4.0–10.5)
nRBC: 0 % (ref 0.0–0.2)

## 2019-05-10 LAB — IRON AND TIBC
Iron: 22 ug/dL — ABNORMAL LOW (ref 28–170)
Saturation Ratios: 6 % — ABNORMAL LOW (ref 10.4–31.8)
TIBC: 356 ug/dL (ref 250–450)
UIBC: 334 ug/dL

## 2019-05-10 LAB — FERRITIN: Ferritin: 10 ng/mL — ABNORMAL LOW (ref 11–307)

## 2019-05-10 MED ORDER — EPOETIN ALFA-EPBX 40000 UNIT/ML IJ SOLN
40000.0000 [IU] | Freq: Once | INTRAMUSCULAR | Status: AC
  Administered 2019-05-10: 40000 [IU] via SUBCUTANEOUS
  Filled 2019-05-10: qty 1

## 2019-06-04 ENCOUNTER — Other Ambulatory Visit: Payer: Medicare Other

## 2019-06-04 ENCOUNTER — Ambulatory Visit: Payer: Medicare Other | Admitting: Oncology

## 2019-06-04 ENCOUNTER — Ambulatory Visit: Payer: Medicare Other

## 2019-06-06 NOTE — Progress Notes (Deleted)
[place]  [phone] [phone]) [phone]  ID: MAECIE SEVCIK OB: 1950/07/08  MR#: [id number]  QIW#:[id number]  Patient Care Team: Idelle Crouch, MD as PCP - General (Internal Medicine)  CHIEF COMPLAINT: Anemia, unspecified  INTERVAL HISTORY: Patient returns to clinic today for repeat laboratory work, further evaluation, and continuation of Retacrit.  She continues to have chronic weakness and fatigue.  She also has chronic shortness of breath which requires oxygen 24 hours/day. She has no neurologic complaints.  She denies any recent fevers or illnesses.  She has a good appetite and denies weight loss.  She has no chest pain, shortness of breath, cough, or hemoptysis.  She denies any nausea, vomiting, constipation, or diarrhea.  She has no melena or hematochezia.  She has no urinary complaints.  Patient offers no further specific complaints today.  REVIEW OF SYSTEMS:   Review of Systems  Constitutional: Positive for malaise/fatigue. Negative for fever and weight loss.  Respiratory: Positive for shortness of breath. Negative for cough and hemoptysis.   Cardiovascular: Negative.  Negative for chest pain and leg swelling.  Gastrointestinal: Negative.  Negative for abdominal pain, blood in stool and melena.  Genitourinary: Negative.  Negative for hematuria.  Musculoskeletal: Negative.  Negative for back pain.  Skin: Negative.  Negative for rash.  Neurological: Positive for weakness. Negative for dizziness, focal weakness and headaches.  Psychiatric/Behavioral: Negative.  The patient is not nervous/anxious.     As per HPI. Otherwise, a complete review of systems is negative.  PAST MEDICAL HISTORY: Past Medical History:  Diagnosis Date  . Anemia   . Anxiety   . Arthritis   . Atrial fibrillation ([place])   . C. difficile colitis 09/2015  . COPD (chronic obstructive pulmonary disease) ([place])   . Depression   . Dyspnea   . Dysrhythmia   .  Fibromyalgia   . H/O tracheostomy   . Hep C w/ coma, chronic ([place])   . Hypertension   . MRSA pneumonia ([place]) 2017  . On home oxygen therapy    3 L/M   . Osteoporosis   . Peripheral neuropathy   . RLS (restless legs syndrome)   . S/P percutaneous endoscopic gastrostomy (PEG) tube placement ([place]) 09/2015  . Ventilator associated pneumonia [facility]) 10/2015   [facility], [place]    PAST SURGICAL HISTORY: Past Surgical History:  Procedure Laterality Date  . ABDOMINAL HYSTERECTOMY    . BREAST SURGERY Bilateral    Breast Implants  . DILATION AND CURETTAGE OF UTERUS    . REVERSE SHOULDER ARTHROPLASTY Right 03/06/2017   Procedure: REVERSE SHOULDER ARTHROPLASTY;  Surgeon: Corky Mull, MD;  Location: [facility] ORS;  Service: Orthopedics;  Laterality: Right;  . ROTATOR CUFF REPAIR Bilateral     FAMILY HISTORY: Family History  Problem Relation Age of Onset  . Hypertension Mother     ADVANCED DIRECTIVES (Y/N):  N  HEALTH MAINTENANCE: Social History   Tobacco Use  . Smoking status: Former Smoker    Packs/day: 1.50    Types: Cigarettes    Quit date: 10/05/2015    Years since quitting: 3.6  . Smokeless tobacco: Never Used  Substance Use Topics  . Alcohol use: No  . Drug use: No     Colonoscopy:  PAP:  Bone density:  Lipid panel:  No Known Allergies  Current Outpatient Medications  Medication Sig Dispense Refill  . albuterol (PROVENTIL) (2.5 MG/3ML) 0.083% nebulizer solution Take 2.5 mg by nebulization See admin instructions. Uses three times a  day. Uses a fourth time if needed for shortness of breath.    Marland Kitchen amitriptyline (ELAVIL) 25 MG tablet Take 25 mg by mouth at bedtime.     Marland Kitchen apixaban (ELIQUIS) 5 MG TABS tablet Take 1 tablet (5 mg total) by mouth 2 (two) times daily. 60 tablet 0  . azithromycin (ZITHROMAX) 250 MG tablet Take 1 a day for 4 days 4 each 0  . diazepam (VALIUM) 5 MG tablet Take 5 mg by mouth every 8 (eight) hours as needed for anxiety.   5  . diltiazem  (CARDIZEM) 30 MG tablet     . estradiol (ESTRACE) 1 MG tablet Take 1 mg by mouth daily.  1  . Fluticasone-Umeclidin-Vilant 100-62.5-25 MCG/INH AEPB Inhale into the lungs.    . furosemide (LASIX) 20 MG tablet Take 20 mg by mouth daily.   1  . gabapentin (NEURONTIN) 100 MG capsule Take 100 mg by mouth 3 (three) times daily.     Marland Kitchen HYDROcodone-acetaminophen (NORCO) 10-325 MG tablet TK 1 T PO Q 6 H PRN P    . lisinopril (ZESTRIL) 10 MG tablet TK 1 T PO ONCE D    . MAGNESIUM-OXIDE 400 (241.3 Mg) MG tablet Take 400 mg by mouth daily.   1  . meloxicam (MOBIC) 7.5 MG tablet Take 7.5 mg by mouth daily.  1  . metoprolol tartrate (LOPRESSOR) 25 MG tablet Take 2 tablets (50 mg total) by mouth 2 (two) times daily. 60 tablet 0  . Multiple Vitamin (MULTIVITAMIN) tablet Take 1 tablet by mouth daily.    Marland Kitchen oxyCODONE (OXY IR/ROXICODONE) 5 MG immediate release tablet Take 1-2 tablets (5-10 mg total) by mouth every 4 (four) hours as needed for breakthrough pain. 60 tablet 0  . PROAIR HFA 108 (90 Base) MCG/ACT inhaler Inhale 2 puffs into the lungs every 6 (six) hours as needed for wheezing or shortness of breath.   11  . QUEtiapine (SEROQUEL) 25 MG tablet Take 25 mg by mouth at bedtime.   1   No current facility-administered medications for this visit.    OBJECTIVE: There were no vitals filed for this visit.   There is no height or weight on file to calculate BMI.    ECOG FS:1 - Symptomatic but completely ambulatory  General: Thin, mild respiratory distress. Eyes: Pink conjunctiva, anicteric sclera. HEENT: Normocephalic, moist mucous membranes. Lungs: Clear to auscultation bilaterally. Heart: Regular rate and rhythm. No rubs, murmurs, or gallops. Abdomen: Soft, nontender, nondistended. No organomegaly noted, normoactive bowel sounds. Musculoskeletal: No edema, cyanosis, or clubbing. Neuro: Alert, answering all questions appropriately. Cranial nerves grossly intact. Skin: No rashes or petechiae noted. Psych:  Normal affect.  LAB RESULTS:  Lab Results  Component Value Date   NA 138 04/20/2019   K 4.3 04/20/2019   CL 94 (L) 04/20/2019   CO2 33 (H) 04/20/2019   GLUCOSE 103 (H) 04/20/2019   BUN 9 04/20/2019   CREATININE 0.70 04/20/2019   CALCIUM 9.1 04/20/2019   PROT 7.2 11/09/2016   ALBUMIN 4.1 11/09/2016   AST 27 11/09/2016   ALT 19 11/09/2016   ALKPHOS 18 (L) 11/09/2016   BILITOT 0.5 11/09/2016   GFRNONAA >60 04/20/2019   GFRAA >60 04/20/2019    Lab Results  Component Value Date   WBC 8.9 05/10/2019   NEUTROABS 7.6 05/10/2019   HGB 7.4 (L) 05/10/2019   HCT 26.3 (L) 05/10/2019   MCV 90.4 05/10/2019   PLT 190 05/10/2019     STUDIES: No results found.  ASSESSMENT: Anemia, unspecified.  PLAN:    1.  Anemia, unspecified: Patient's hemoglobin remained significantly decreased at 7.1.  Previously, her reticulocyte count is inappropriately normal and erythropoietin level was only mildly elevated.  The remainder of her laboratory work including iron stores, B12, folate, and hemolysis labs are all either negative or within normal limits.  ANA is also negative.  Given her lack of renal insufficiency and inappropriately normal reticulocyte count, she possibly has an underlying bone marrow disorder.  Patient may benefit from a bone marrow biopsy in the near future.  Will also order peripheral blood extended MDS panel.  Proceed with Retacrit today.  Return to clinic every 4 weeks for laboratory work and reticulocyte rate if her hemoglobin remains below 10.0.  Patient will then return to clinic in 4 months with repeat laboratory work, further evaluation, and additional discussion about possible bone marrow biopsy.    I spent a total of 30 minutes face-to-face with the patient of which greater than 50% of the visit was spent in counseling and coordination of care as detailed above.   Patient expressed understanding and was in agreement with this plan. She also understands that She can call  clinic at any time with any questions, concerns, or complaints.     Lloyd Huger, MD   06/06/2019 10:13 AM

## 2019-06-09 ENCOUNTER — Other Ambulatory Visit: Payer: Self-pay | Admitting: Emergency Medicine

## 2019-06-09 DIAGNOSIS — D649 Anemia, unspecified: Secondary | ICD-10-CM

## 2019-06-10 ENCOUNTER — Encounter: Payer: Self-pay | Admitting: Oncology

## 2019-06-10 ENCOUNTER — Inpatient Hospital Stay: Payer: Medicare Other

## 2019-06-10 ENCOUNTER — Inpatient Hospital Stay: Payer: Medicare Other | Admitting: Oncology

## 2019-06-10 DIAGNOSIS — D649 Anemia, unspecified: Secondary | ICD-10-CM

## 2019-07-08 ENCOUNTER — Ambulatory Visit: Payer: Medicare Other | Admitting: Oncology

## 2019-07-08 ENCOUNTER — Other Ambulatory Visit: Payer: Medicare Other

## 2019-07-08 ENCOUNTER — Ambulatory Visit: Payer: Medicare Other

## 2019-07-23 ENCOUNTER — Other Ambulatory Visit: Payer: Self-pay

## 2019-07-23 NOTE — Progress Notes (Signed)
[name]  Telephone:(336) [phone] Fax:(336) [phone]  ID: [place] [name] OB: 1951/05/17  MR#: [id number]  NLG#:[id number]  Patient Care Team: Idelle Crouch, MD as PCP - General (Internal Medicine)  CHIEF COMPLAINT: Anemia, unspecified  INTERVAL HISTORY: Patient returns to clinic today for repeat laboratory work and further evaluation.  She has noticed worsening weakness and fatigue as well as increasing shortness of breath over the past several weeks.  She continues to require oxygen 24 hours/day.  She has no neurologic complaints.  She denies any recent fevers or illnesses.  She has a good appetite and denies weight loss.  She has no chest pain, shortness of breath, cough, or hemoptysis.  She denies any nausea, vomiting, constipation, or diarrhea.  She has no melena or hematochezia.  She has no urinary complaints.  Patient offers no further specific complaints today.  REVIEW OF SYSTEMS:   Review of Systems  Constitutional: Positive for malaise/fatigue. Negative for fever and weight loss.  Respiratory: Positive for shortness of breath. Negative for cough and hemoptysis.   Cardiovascular: Negative.  Negative for chest pain and leg swelling.  Gastrointestinal: Negative.  Negative for abdominal pain, blood in stool and melena.  Genitourinary: Negative.  Negative for hematuria.  Musculoskeletal: Negative.  Negative for back pain.  Skin: Negative.  Negative for rash.  Neurological: Positive for weakness. Negative for dizziness, focal weakness and headaches.  Psychiatric/Behavioral: Negative.  The patient is not nervous/anxious.     As per HPI. Otherwise, a complete review of systems is negative.  PAST MEDICAL HISTORY: Past Medical History:  Diagnosis Date  . Anemia   . Anxiety   . Arthritis   . Atrial fibrillation ([place])   . C. difficile colitis 09/2015  . COPD (chronic obstructive pulmonary disease) ([place])   . Depression   . Dyspnea   . Dysrhythmia   .  Fibromyalgia   . H/O tracheostomy   . Hep C w/ coma, chronic ([place])   . Hypertension   . MRSA pneumonia ([place]) 2017  . On home oxygen therapy    3 L/M   . Osteoporosis   . Peripheral neuropathy   . RLS (restless legs syndrome)   . S/P percutaneous endoscopic gastrostomy (PEG) tube placement ([place]) 09/2015  . Ventilator associated pneumonia [facility]) 10/2015   [facility], [place]    PAST SURGICAL HISTORY: Past Surgical History:  Procedure Laterality Date  . ABDOMINAL HYSTERECTOMY    . BREAST SURGERY Bilateral    Breast Implants  . DILATION AND CURETTAGE OF UTERUS    . REVERSE SHOULDER ARTHROPLASTY Right 03/06/2017   Procedure: REVERSE SHOULDER ARTHROPLASTY;  Surgeon: Corky Mull, MD;  Location: [facility] ORS;  Service: Orthopedics;  Laterality: Right;  . ROTATOR CUFF REPAIR Bilateral     FAMILY HISTORY: Family History  Problem Relation Age of Onset  . Hypertension Mother     ADVANCED DIRECTIVES (Y/N):  N  HEALTH MAINTENANCE: Social History   Tobacco Use  . Smoking status: Former Smoker    Packs/day: 1.50    Types: Cigarettes    Quit date: 10/05/2015    Years since quitting: 3.8  . Smokeless tobacco: Never Used  Substance Use Topics  . Alcohol use: No  . Drug use: No     Colonoscopy:  PAP:  Bone density:  Lipid panel:  No Known Allergies  Current Outpatient Medications  Medication Sig Dispense Refill  . albuterol (PROVENTIL) (2.5 MG/3ML) 0.083% nebulizer solution Inhale into the lungs.    Marland Kitchen  amitriptyline (ELAVIL) 25 MG tablet Take 25 mg by mouth at bedtime.     Marland Kitchen apixaban (ELIQUIS) 5 MG TABS tablet Take 1 tablet (5 mg total) by mouth 2 (two) times daily. 60 tablet 0  . DALIRESP 250 MCG TABS Take 1 tablet by mouth daily.    . diazepam (VALIUM) 5 MG tablet Take 5 mg by mouth every 8 (eight) hours as needed for anxiety.   5  . diltiazem (CARDIZEM CD) 120 MG 24 hr capsule Take 120 mg by mouth daily.    Marland Kitchen estradiol (ESTRACE) 1 MG tablet Take 1 mg by mouth daily.   1  . Fluticasone-Umeclidin-Vilant 100-62.5-25 MCG/INH AEPB Inhale into the lungs.    . furosemide (LASIX) 20 MG tablet Take 20 mg by mouth daily.   1  . gabapentin (NEURONTIN) 100 MG capsule Take 100 mg by mouth 3 (three) times daily.     Marland Kitchen HYDROcodone-acetaminophen (NORCO) 10-325 MG tablet TK 1 T PO Q 6 H PRN P    . ipratropium-albuterol (DUONEB) 0.5-2.5 (3) MG/3ML SOLN Inhale into the lungs.    Marland Kitchen lisinopril (ZESTRIL) 10 MG tablet TK 1 T PO ONCE D    . MAGNESIUM-OXIDE 400 (241.3 Mg) MG tablet Take 400 mg by mouth daily.   1  . meloxicam (MOBIC) 7.5 MG tablet Take 7.5 mg by mouth daily.  1  . metoprolol tartrate (LOPRESSOR) 25 MG tablet Take 2 tablets (50 mg total) by mouth 2 (two) times daily. 60 tablet 0  . Multiple Vitamin (MULTIVITAMIN) tablet Take 1 tablet by mouth daily.    Marland Kitchen omeprazole (PRILOSEC) 40 MG capsule Take 40 mg by mouth daily.    Marland Kitchen oxyCODONE (OXY IR/ROXICODONE) 5 MG immediate release tablet Take 1-2 tablets (5-10 mg total) by mouth every 4 (four) hours as needed for breakthrough pain. 60 tablet 0  . PROAIR HFA 108 (90 Base) MCG/ACT inhaler Inhale 2 puffs into the lungs every 6 (six) hours as needed for wheezing or shortness of breath.   11  . QUEtiapine (SEROQUEL) 25 MG tablet Take 25 mg by mouth at bedtime.   1  . predniSONE (DELTASONE) 5 MG tablet Take 5 mg by mouth daily.     No current facility-administered medications for this visit.    OBJECTIVE: Vitals:   07/26/19 1420  BP: 109/77  Pulse: 72  Temp: (!) 96.6 F (35.9 C)  SpO2: 100%     Body mass index is 19.26 kg/m.    ECOG FS:1 - Symptomatic but completely ambulatory  General: Thin, mild respiratory distress Eyes: Pink conjunctiva, anicteric sclera. HEENT: Normocephalic, moist mucous membranes. Lungs: No audible wheezing or coughing. Heart: Regular rate and rhythm. Abdomen: Soft, nontender, no obvious distention. Musculoskeletal: No edema, cyanosis, or clubbing. Neuro: Alert, answering all questions  appropriately. Cranial nerves grossly intact. Skin: No rashes or petechiae noted. Psych: Normal affect.  LAB RESULTS:  Lab Results  Component Value Date   NA 138 04/20/2019   K 4.3 04/20/2019   CL 94 (L) 04/20/2019   CO2 33 (H) 04/20/2019   GLUCOSE 103 (H) 04/20/2019   BUN 9 04/20/2019   CREATININE 0.70 04/20/2019   CALCIUM 9.1 04/20/2019   PROT 7.2 11/09/2016   ALBUMIN 4.1 11/09/2016   AST 27 11/09/2016   ALT 19 11/09/2016   ALKPHOS 18 (L) 11/09/2016   BILITOT 0.5 11/09/2016   GFRNONAA >60 04/20/2019   GFRAA >60 04/20/2019    Lab Results  Component Value Date   WBC 9.2 07/26/2019  NEUTROABS 7.3 07/26/2019   HGB 6.9 (L) 07/26/2019   HCT 25.6 (L) 07/26/2019   MCV 93.8 07/26/2019   PLT 315 07/26/2019     STUDIES: No results found.  ASSESSMENT: Anemia, unspecified.  PLAN:    1.  Anemia, unspecified: Patient's hemoglobin has trended down is now 6.9.  Iron stores in November 2020 were noted to be decreased.  Previously, her reticulocyte count is inappropriately normal and erythropoietin level was only mildly elevated.  The remainder of her laboratory work including B12, folate, and hemolysis labs are all either negative or within normal limits.  ANA is also negative.  Given her lack of renal insufficiency and inappropriately normal reticulocyte count, she possibly has an underlying bone marrow disorder.  Will order peripheral blood extended MDS panel.  Patient has agreed to bone marrow biopsy.  Return to clinic on Thursday for blood transfusion.  Patient will return to clinic 1 to 2 weeks after her bone marrow biopsy for discussion of results, additional transfusion if needed, and treatment planning necessary.  I spent a total of 30 minutes reviewing chart data, face-to-face evaluation with the patient, counseling and coordination of care as detailed above.   Patient expressed understanding and was in agreement with this plan. She also understands that She can call clinic  at any time with any questions, concerns, or complaints.     Lloyd Huger, MD   07/26/2019 4:12 PM

## 2019-07-23 NOTE — Progress Notes (Signed)
Patient prescreened for appointment. Patient has no concerns or questions.  

## 2019-07-26 ENCOUNTER — Inpatient Hospital Stay: Payer: Medicare Other

## 2019-07-26 ENCOUNTER — Other Ambulatory Visit: Payer: Self-pay

## 2019-07-26 ENCOUNTER — Telehealth: Payer: Self-pay | Admitting: *Deleted

## 2019-07-26 ENCOUNTER — Inpatient Hospital Stay: Payer: Medicare Other | Attending: Oncology

## 2019-07-26 ENCOUNTER — Inpatient Hospital Stay (HOSPITAL_BASED_OUTPATIENT_CLINIC_OR_DEPARTMENT_OTHER): Payer: Medicare Other | Admitting: Oncology

## 2019-07-26 VITALS — BP 109/77 | HR 72 | Temp 96.6°F | Wt 105.3 lb

## 2019-07-26 DIAGNOSIS — B182 Chronic viral hepatitis C: Secondary | ICD-10-CM | POA: Diagnosis not present

## 2019-07-26 DIAGNOSIS — I10 Essential (primary) hypertension: Secondary | ICD-10-CM | POA: Insufficient documentation

## 2019-07-26 DIAGNOSIS — Z79899 Other long term (current) drug therapy: Secondary | ICD-10-CM | POA: Insufficient documentation

## 2019-07-26 DIAGNOSIS — D649 Anemia, unspecified: Secondary | ICD-10-CM | POA: Diagnosis not present

## 2019-07-26 DIAGNOSIS — Z87891 Personal history of nicotine dependence: Secondary | ICD-10-CM | POA: Insufficient documentation

## 2019-07-26 LAB — CBC WITH DIFFERENTIAL/PLATELET
Abs Immature Granulocytes: 0.04 10*3/uL (ref 0.00–0.07)
Basophils Absolute: 0.1 10*3/uL (ref 0.0–0.1)
Basophils Relative: 1 %
Eosinophils Absolute: 0.2 10*3/uL (ref 0.0–0.5)
Eosinophils Relative: 2 %
HCT: 25.6 % — ABNORMAL LOW (ref 36.0–46.0)
Hemoglobin: 6.9 g/dL — ABNORMAL LOW (ref 12.0–15.0)
Immature Granulocytes: 0 %
Lymphocytes Relative: 10 %
Lymphs Abs: 0.9 10*3/uL (ref 0.7–4.0)
MCH: 25.3 pg — ABNORMAL LOW (ref 26.0–34.0)
MCHC: 27 g/dL — ABNORMAL LOW (ref 30.0–36.0)
MCV: 93.8 fL (ref 80.0–100.0)
Monocytes Absolute: 0.7 10*3/uL (ref 0.1–1.0)
Monocytes Relative: 7 %
Neutro Abs: 7.3 10*3/uL (ref 1.7–7.7)
Neutrophils Relative %: 80 %
Platelets: 315 10*3/uL (ref 150–400)
RBC: 2.73 MIL/uL — ABNORMAL LOW (ref 3.87–5.11)
RDW: 15.1 % (ref 11.5–15.5)
WBC: 9.2 10*3/uL (ref 4.0–10.5)
nRBC: 0 % (ref 0.0–0.2)

## 2019-07-26 LAB — IRON AND TIBC
Iron: 26 ug/dL — ABNORMAL LOW (ref 28–170)
Saturation Ratios: 6 % — ABNORMAL LOW (ref 10.4–31.8)
TIBC: 465 ug/dL — ABNORMAL HIGH (ref 250–450)
UIBC: 439 ug/dL

## 2019-07-26 LAB — FERRITIN: Ferritin: 7 ng/mL — ABNORMAL LOW (ref 11–307)

## 2019-07-26 NOTE — Telephone Encounter (Signed)
Pt needs a blood transfusion later this week and a BMBx in the next 1-2 weeks.

## 2019-07-26 NOTE — Telephone Encounter (Signed)
Patient was unable to remember what was said at todays appointment and daughter in law is asking to be informed of what was said. Please advise

## 2019-07-27 ENCOUNTER — Other Ambulatory Visit: Payer: Self-pay | Admitting: Oncology

## 2019-07-27 DIAGNOSIS — D649 Anemia, unspecified: Secondary | ICD-10-CM

## 2019-07-27 NOTE — Telephone Encounter (Signed)
I returned call to daughter and discussed her visit with Dr Orlie Dakin and then answered multiple questions about the biopsy and confirmed blood transfusion appointment Thursday

## 2019-07-29 ENCOUNTER — Telehealth: Payer: Self-pay | Admitting: Emergency Medicine

## 2019-07-29 ENCOUNTER — Other Ambulatory Visit: Payer: Self-pay

## 2019-07-29 ENCOUNTER — Inpatient Hospital Stay: Payer: Medicare Other

## 2019-07-29 DIAGNOSIS — D649 Anemia, unspecified: Secondary | ICD-10-CM

## 2019-07-29 LAB — CBC WITH DIFFERENTIAL/PLATELET
Abs Immature Granulocytes: 0.03 10*3/uL (ref 0.00–0.07)
Basophils Absolute: 0.1 10*3/uL (ref 0.0–0.1)
Basophils Relative: 1 %
Eosinophils Absolute: 0.3 10*3/uL (ref 0.0–0.5)
Eosinophils Relative: 4 %
HCT: 25.7 % — ABNORMAL LOW (ref 36.0–46.0)
Hemoglobin: 7 g/dL — ABNORMAL LOW (ref 12.0–15.0)
Immature Granulocytes: 0 %
Lymphocytes Relative: 12 %
Lymphs Abs: 0.9 10*3/uL (ref 0.7–4.0)
MCH: 25.5 pg — ABNORMAL LOW (ref 26.0–34.0)
MCHC: 27.2 g/dL — ABNORMAL LOW (ref 30.0–36.0)
MCV: 93.5 fL (ref 80.0–100.0)
Monocytes Absolute: 0.8 10*3/uL (ref 0.1–1.0)
Monocytes Relative: 11 %
Neutro Abs: 4.9 10*3/uL (ref 1.7–7.7)
Neutrophils Relative %: 72 %
Platelets: 268 10*3/uL (ref 150–400)
RBC: 2.75 MIL/uL — ABNORMAL LOW (ref 3.87–5.11)
RDW: 14.8 % (ref 11.5–15.5)
WBC: 6.9 10*3/uL (ref 4.0–10.5)
nRBC: 0 % (ref 0.0–0.2)

## 2019-07-29 LAB — SAMPLE TO BLOOD BANK

## 2019-07-29 LAB — PREPARE RBC (CROSSMATCH)

## 2019-07-29 MED ORDER — SODIUM CHLORIDE 0.9% IV SOLUTION
250.0000 mL | Freq: Once | INTRAVENOUS | Status: AC
  Administered 2019-07-29: 250 mL via INTRAVENOUS
  Filled 2019-07-29: qty 250

## 2019-07-29 MED ORDER — ACETAMINOPHEN 325 MG PO TABS
650.0000 mg | ORAL_TABLET | Freq: Once | ORAL | Status: AC
  Administered 2019-07-29: 11:00:00 650 mg via ORAL
  Filled 2019-07-29: qty 2

## 2019-07-29 MED ORDER — DIPHENHYDRAMINE HCL 50 MG/ML IJ SOLN
25.0000 mg | Freq: Once | INTRAMUSCULAR | Status: AC
  Administered 2019-07-29: 11:00:00 25 mg via INTRAVENOUS
  Filled 2019-07-29: qty 1

## 2019-07-29 NOTE — Telephone Encounter (Signed)
Called pt to let her know BMBx is scheduled for 2/15 at 7AM. Pt verbalized understanding and didn't have any further questions or concerns.

## 2019-07-30 LAB — BPAM RBC
Blood Product Expiration Date: 202102242359
ISSUE DATE / TIME: 202102041106
Unit Type and Rh: 7300

## 2019-07-30 LAB — TYPE AND SCREEN
ABO/RH(D): B POS
Antibody Screen: NEGATIVE
Unit division: 0

## 2019-08-02 ENCOUNTER — Other Ambulatory Visit: Payer: Self-pay | Admitting: Oncology

## 2019-08-03 ENCOUNTER — Telehealth: Payer: Self-pay | Admitting: Emergency Medicine

## 2019-08-03 NOTE — Telephone Encounter (Signed)
Pt's daughter in law called to request that biopsy time be moved to a later time as "it takes her 2 hours just to get breathing right" after waking up, and since her mother-in-law doesn't live with her, she would have to get up at 3AM to be here at 7. Explained that we may have to push biopsy date out, called scheduling and left message. Awaiting call back.

## 2019-08-09 ENCOUNTER — Ambulatory Visit: Admission: RE | Admit: 2019-08-09 | Payer: Medicare Other | Source: Ambulatory Visit

## 2019-08-12 NOTE — Progress Notes (Deleted)
[place]  [phone] [phone]  ID: Virginia [name] OB: [birth year]  MR#: [id number]  IRW#:[id number]  Patient Care Team: Idelle Crouch, MD as PCP - General (Internal Medicine)  CHIEF COMPLAINT: Anemia, unspecified  INTERVAL HISTORY: Patient returns to clinic today for repeat laboratory work and further evaluation.  She has noticed worsening weakness and fatigue as well as increasing shortness of breath over the past several weeks.  She continues to require oxygen 24 hours/day.  She has no neurologic complaints.  She denies any recent fevers or illnesses.  She has a good appetite and denies weight loss.  She has no chest pain, shortness of breath, cough, or hemoptysis.  She denies any nausea, vomiting, constipation, or diarrhea.  She has no melena or hematochezia.  She has no urinary complaints.  Patient offers no further specific complaints today.  REVIEW OF SYSTEMS:   Review of Systems  Constitutional: Positive for malaise/fatigue. Negative for fever and weight loss.  Respiratory: Positive for shortness of breath. Negative for cough and hemoptysis.   Cardiovascular: Negative.  Negative for chest pain and leg swelling.  Gastrointestinal: Negative.  Negative for abdominal pain, blood in stool and melena.  Genitourinary: Negative.  Negative for hematuria.  Musculoskeletal: Negative.  Negative for back pain.  Skin: Negative.  Negative for rash.  Neurological: Positive for weakness. Negative for dizziness, focal weakness and headaches.  Psychiatric/Behavioral: Negative.  The patient is not nervous/anxious.     As per HPI. Otherwise, a complete review of systems is negative.  PAST MEDICAL HISTORY: Past Medical History:  Diagnosis Date  . Anemia   . Anxiety   . Arthritis   . Atrial fibrillation ([place])   . C. difficile colitis 09/2015  . COPD (chronic obstructive pulmonary disease) ([place])   . Depression   . Dyspnea   . Dysrhythmia   .  Fibromyalgia   . H/O tracheostomy   . Hep C w/ coma, chronic ([place])   . Hypertension   . MRSA pneumonia ([place]) 2017  . On home oxygen therapy    3 L/M   . Osteoporosis   . Peripheral neuropathy   . RLS (restless legs syndrome)   . S/P percutaneous endoscopic gastrostomy (PEG) tube placement ([place]) 09/2015  . Ventilator associated pneumonia [facility]) 10/2015   [facility], [place]    PAST SURGICAL HISTORY: Past Surgical History:  Procedure Laterality Date  . ABDOMINAL HYSTERECTOMY    . BREAST SURGERY Bilateral    Breast Implants  . DILATION AND CURETTAGE OF UTERUS    . REVERSE SHOULDER ARTHROPLASTY Right 03/06/2017   Procedure: REVERSE SHOULDER ARTHROPLASTY;  Surgeon: Corky Mull, MD;  Location: [facility] ORS;  Service: Orthopedics;  Laterality: Right;  . ROTATOR CUFF REPAIR Bilateral     FAMILY HISTORY: Family History  Problem Relation Age of Onset  . Hypertension Mother     ADVANCED DIRECTIVES (Y/N):  N  HEALTH MAINTENANCE: Social History   Tobacco Use  . Smoking status: Former Smoker    Packs/day: 1.50    Types: Cigarettes    Quit date: 10/05/2015    Years since quitting: 3.8  . Smokeless tobacco: Never Used  Substance Use Topics  . Alcohol use: No  . Drug use: No     Colonoscopy:  PAP:  Bone density:  Lipid panel:  No Known Allergies  Current Outpatient Medications  Medication Sig Dispense Refill  . albuterol (PROVENTIL) (2.5 MG/3ML) 0.083% nebulizer solution Inhale into the lungs.    Marland Kitchen  amitriptyline (ELAVIL) 25 MG tablet Take 25 mg by mouth at bedtime.     Marland Kitchen apixaban (ELIQUIS) 5 MG TABS tablet Take 1 tablet (5 mg total) by mouth 2 (two) times daily. 60 tablet 0  . DALIRESP 250 MCG TABS Take 1 tablet by mouth daily.    . diazepam (VALIUM) 5 MG tablet Take 5 mg by mouth every 8 (eight) hours as needed for anxiety.   5  . diltiazem (CARDIZEM CD) 120 MG 24 hr capsule Take 120 mg by mouth daily.    Marland Kitchen estradiol (ESTRACE) 1 MG tablet Take 1 mg by mouth daily.   1  . Fluticasone-Umeclidin-Vilant 100-62.5-25 MCG/INH AEPB Inhale into the lungs.    . furosemide (LASIX) 20 MG tablet Take 20 mg by mouth daily.   1  . gabapentin (NEURONTIN) 100 MG capsule Take 100 mg by mouth 3 (three) times daily.     Marland Kitchen HYDROcodone-acetaminophen (NORCO) 10-325 MG tablet TK 1 T PO Q 6 H PRN P    . ipratropium-albuterol (DUONEB) 0.5-2.5 (3) MG/3ML SOLN Inhale into the lungs.    Marland Kitchen lisinopril (ZESTRIL) 10 MG tablet TK 1 T PO ONCE D    . MAGNESIUM-OXIDE 400 (241.3 Mg) MG tablet Take 400 mg by mouth daily.   1  . meloxicam (MOBIC) 7.5 MG tablet Take 7.5 mg by mouth daily.  1  . metoprolol tartrate (LOPRESSOR) 25 MG tablet Take 2 tablets (50 mg total) by mouth 2 (two) times daily. 60 tablet 0  . Multiple Vitamin (MULTIVITAMIN) tablet Take 1 tablet by mouth daily.    Marland Kitchen omeprazole (PRILOSEC) 40 MG capsule Take 40 mg by mouth daily.    Marland Kitchen oxyCODONE (OXY IR/ROXICODONE) 5 MG immediate release tablet Take 1-2 tablets (5-10 mg total) by mouth every 4 (four) hours as needed for breakthrough pain. 60 tablet 0  . predniSONE (DELTASONE) 5 MG tablet Take 5 mg by mouth daily.    Marland Kitchen PROAIR HFA 108 (90 Base) MCG/ACT inhaler Inhale 2 puffs into the lungs every 6 (six) hours as needed for wheezing or shortness of breath.   11  . QUEtiapine (SEROQUEL) 25 MG tablet Take 25 mg by mouth at bedtime.   1   No current facility-administered medications for this visit.    OBJECTIVE: There were no vitals filed for this visit.   There is no height or weight on file to calculate BMI.    ECOG FS:1 - Symptomatic but completely ambulatory  General: Thin, mild respiratory distress Eyes: Pink conjunctiva, anicteric sclera. HEENT: Normocephalic, moist mucous membranes. Lungs: No audible wheezing or coughing. Heart: Regular rate and rhythm. Abdomen: Soft, nontender, no obvious distention. Musculoskeletal: No edema, cyanosis, or clubbing. Neuro: Alert, answering all questions appropriately. Cranial nerves  grossly intact. Skin: No rashes or petechiae noted. Psych: Normal affect.  LAB RESULTS:  Lab Results  Component Value Date   NA 138 04/20/2019   K 4.3 04/20/2019   CL 94 (L) 04/20/2019   CO2 33 (H) 04/20/2019   GLUCOSE 103 (H) 04/20/2019   BUN 9 04/20/2019   CREATININE 0.70 04/20/2019   CALCIUM 9.1 04/20/2019   PROT 7.2 11/09/2016   ALBUMIN 4.1 11/09/2016   AST 27 11/09/2016   ALT 19 11/09/2016   ALKPHOS 18 (L) 11/09/2016   BILITOT 0.5 11/09/2016   GFRNONAA >60 04/20/2019   GFRAA >60 04/20/2019    Lab Results  Component Value Date   WBC 6.9 07/29/2019   NEUTROABS 4.9 07/29/2019   HGB 7.0 (L)  07/29/2019   HCT 25.7 (L) 07/29/2019   MCV 93.5 07/29/2019   PLT 268 07/29/2019     STUDIES: No results found.  ASSESSMENT: Anemia, unspecified.  PLAN:    1.  Anemia, unspecified: Patient's hemoglobin has trended down is now 6.9.  Iron stores in November 2020 were noted to be decreased.  Previously, her reticulocyte count is inappropriately normal and erythropoietin level was only mildly elevated.  The remainder of her laboratory work including B12, folate, and hemolysis labs are all either negative or within normal limits.  ANA is also negative.  Given her lack of renal insufficiency and inappropriately normal reticulocyte count, she possibly has an underlying bone marrow disorder.  Will order peripheral blood extended MDS panel.  Patient has agreed to bone marrow biopsy.  Return to clinic on Thursday for blood transfusion.  Patient will return to clinic 1 to 2 weeks after her bone marrow biopsy for discussion of results, additional transfusion if needed, and treatment planning necessary.  I spent a total of 30 minutes reviewing chart data, face-to-face evaluation with the patient, counseling and coordination of care as detailed above.   Patient expressed understanding and was in agreement with this plan. She also understands that She can call clinic at any time with any  questions, concerns, or complaints.     Lloyd Huger, MD   08/12/2019 2:04 PM

## 2019-08-16 ENCOUNTER — Inpatient Hospital Stay: Payer: Medicare Other

## 2019-08-16 ENCOUNTER — Inpatient Hospital Stay: Payer: Medicare Other | Admitting: Oncology

## 2019-08-16 ENCOUNTER — Other Ambulatory Visit: Payer: Self-pay | Admitting: Oncology

## 2019-08-17 ENCOUNTER — Other Ambulatory Visit: Payer: Self-pay | Admitting: Emergency Medicine

## 2019-08-17 ENCOUNTER — Other Ambulatory Visit: Payer: Self-pay

## 2019-08-17 ENCOUNTER — Emergency Department: Payer: Medicare Other

## 2019-08-17 ENCOUNTER — Emergency Department
Admission: EM | Admit: 2019-08-17 | Discharge: 2019-08-17 | Disposition: A | Discharge status: Home / Self Care | Payer: Medicare Other | Attending: Emergency Medicine | Admitting: Emergency Medicine

## 2019-08-17 ENCOUNTER — Encounter: Payer: Self-pay | Admitting: Emergency Medicine

## 2019-08-17 DIAGNOSIS — Z87891 Personal history of nicotine dependence: Secondary | ICD-10-CM | POA: Diagnosis not present

## 2019-08-17 DIAGNOSIS — W19XXXA Unspecified fall, initial encounter: Secondary | ICD-10-CM | POA: Insufficient documentation

## 2019-08-17 DIAGNOSIS — S7002XA Contusion of left hip, initial encounter: Secondary | ICD-10-CM

## 2019-08-17 DIAGNOSIS — Y929 Unspecified place or not applicable: Secondary | ICD-10-CM | POA: Insufficient documentation

## 2019-08-17 DIAGNOSIS — Y999 Unspecified external cause status: Secondary | ICD-10-CM | POA: Diagnosis not present

## 2019-08-17 DIAGNOSIS — J449 Chronic obstructive pulmonary disease, unspecified: Secondary | ICD-10-CM | POA: Diagnosis not present

## 2019-08-17 DIAGNOSIS — Z79899 Other long term (current) drug therapy: Secondary | ICD-10-CM | POA: Insufficient documentation

## 2019-08-17 DIAGNOSIS — I10 Essential (primary) hypertension: Secondary | ICD-10-CM | POA: Diagnosis not present

## 2019-08-17 DIAGNOSIS — Y939 Activity, unspecified: Secondary | ICD-10-CM | POA: Diagnosis not present

## 2019-08-17 DIAGNOSIS — Z7901 Long term (current) use of anticoagulants: Secondary | ICD-10-CM | POA: Diagnosis not present

## 2019-08-17 DIAGNOSIS — I2510 Atherosclerotic heart disease of native coronary artery without angina pectoris: Secondary | ICD-10-CM | POA: Insufficient documentation

## 2019-08-17 DIAGNOSIS — D649 Anemia, unspecified: Secondary | ICD-10-CM

## 2019-08-17 DIAGNOSIS — S79912A Unspecified injury of left hip, initial encounter: Secondary | ICD-10-CM | POA: Diagnosis present

## 2019-08-17 MED ORDER — LIDOCAINE 5 % EX PTCH
1.0000 | MEDICATED_PATCH | CUTANEOUS | Status: DC
  Administered 2019-08-17: 16:00:00 1 via TRANSDERMAL
  Filled 2019-08-17: qty 1

## 2019-08-17 MED ORDER — OXYCODONE-ACETAMINOPHEN 5-325 MG PO TABS
1.0000 | ORAL_TABLET | Freq: Once | ORAL | Status: AC
  Administered 2019-08-17: 1 via ORAL
  Filled 2019-08-17: qty 1

## 2019-08-17 MED ORDER — LIDOCAINE 5 % EX PTCH: 1.0000 | MEDICATED_PATCH | Freq: Two times a day (BID) | CUTANEOUS | 0 refills | Status: DC

## 2019-08-17 NOTE — ED Provider Notes (Signed)
[facility] Emergency Department Provider Note   ____________________________________________   First MD Initiated Contact with Patient 08/17/19 1418     (approximate)  I have reviewed the triage vital signs and the nursing notes.   HISTORY  Chief Complaint Fall    HPI Virginia [name] is a 69 y.o. female patient complain of left hip and pelvic pain secondary to a fall 2 days ago.  Patient the pain radiates from the lateral aspect of her hip into the inguinal area.  Patient pain increases with weightbearing and ambulation.  Patient rates the pain a 7/10.  Patient described the pain is "achy".  No palliative measures for complaint.         Past Medical History:  Diagnosis Date  . Anemia   . Anxiety   . Arthritis   . Atrial fibrillation ([facility])   . C. difficile colitis 09/2015  . COPD (chronic obstructive pulmonary disease) ([facility])   . Depression   . Dyspnea   . Dysrhythmia   . Fibromyalgia   . H/O tracheostomy   . Hep C w/ coma, chronic ([facility])   . Hypertension   . MRSA pneumonia ([facility]) 2017  . On home oxygen therapy    3 L/M   . Osteoporosis   . Peripheral neuropathy   . RLS (restless legs syndrome)   . S/P percutaneous endoscopic gastrostomy (PEG) tube placement ([facility]) 09/2015  . Ventilator associated pneumonia [facility]) 10/2015   [facility], [place]    Patient Active Problem List   Diagnosis Date Noted  . Anemia 01/17/2019  . Coronary artery disease involving native coronary artery of native heart 04/30/2018  . Palpitations 04/30/2018  . Chronic heartburn 04/12/2018  . Other dysphagia 04/12/2018  . Thoracic aortic aneurysm without rupture (H[facility]) 08/27/2017  . 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 SOBOE (shortness of breath on exertion) 01/24/2017  . Paroxysmal A-fib (H[facility]) 11/09/2016  . Chronic hypoxemic respiratory failure (H[facility]) 12/06/2015  . Depression with anxiety 12/06/2015  . Fibromyalgia  12/06/2015  . Hep C w/o coma, chronic (H[facility]) 12/06/2015  . History of tracheostomy 12/06/2015  . HTN, goal below 140/80 12/06/2015  . Localized osteoporosis without current pathological fracture 12/06/2015  . PEG (percutaneous endoscopic gastrostomy) status ([facility]) 12/06/2015  . Peripheral neuropathy, idiopathic 12/06/2015  . RLS (restless legs syndrome) 12/06/2015  . 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  . COPD (chronic obstructive pulmonary disease) (H[facility]) 11/29/2015    Past Surgical History:  Procedure Laterality Date  . ABDOMINAL HYSTERECTOMY    . BREAST SURGERY Bilateral    Breast Implants  . DILATION AND CURETTAGE OF UTERUS    . REVERSE SHOULDER ARTHROPLASTY Right 03/06/2017   Procedure: REVERSE SHOULDER ARTHROPLASTY;  Surgeon: Christena Flake, MD;  Location: [facility] ORS;  Service: Orthopedics;  Laterality: Right;  . ROTATOR CUFF REPAIR Bilateral     Prior to Admission medications   Medication Sig Start Date End Date Taking? Authorizing Provider  albuterol (PROVENTIL) (2.5 MG/3ML) 0.083% nebulizer solution Inhale into the lungs. 02/09/19   [provider]  amitriptyline (ELAVIL) 25 MG tablet Take 25 mg by mouth at bedtime.  01/14/19   [provider]  apixaban (ELIQUIS) 5 MG TABS tablet Take 1 tablet (5 mg total) by mouth 2 (two) times daily. 11/10/16   Adrian Saran, MD  DALIRESP 250 MCG TABS Take 1 tablet by mouth daily. 04/22/19   [provider]  diazepam (VALIUM) 5 MG tablet Take 5 mg by  mouth every 8 (eight) hours as needed for anxiety.  10/22/16   [provider]  diltiazem (CARDIZEM CD) 120 MG 24 hr capsule Take 120 mg by mouth daily. 04/29/19   [provider]  estradiol (ESTRACE) 1 MG tablet Take 1 mg by mouth daily. 09/10/16   [provider]  Fluticasone-Umeclidin-Vilant 100-62.5-25 MCG/INH AEPB Inhale into the lungs. 12/01/18   [provider]  furosemide (LASIX) 20 MG tablet Take 20 mg by mouth daily.  10/01/16    [provider]  gabapentin (NEURONTIN) 100 MG capsule Take 100 mg by mouth 3 (three) times daily.  01/06/19   [provider]  HYDROcodone-acetaminophen (NORCO) 10-325 MG tablet TK 1 T PO Q 6 H PRN P 12/23/18   [provider]  ipratropium-albuterol (DUONEB) 0.5-2.5 (3) MG/3ML SOLN Inhale into the lungs.    [provider]  lidocaine (LIDODERM) 5 % Place 1 patch onto the skin every 12 (twelve) hours. Remove & Discard patch within 12 hours or as directed by MD 08/17/19 08/16/20  Joni Reining, PA-C  lisinopril (ZESTRIL) 10 MG tablet TK 1 T PO ONCE D 12/16/18   [provider]  MAGNESIUM-OXIDE 400 (241.3 Mg) MG tablet Take 400 mg by mouth daily.  08/24/16   [provider]  meloxicam (MOBIC) 7.5 MG tablet Take 7.5 mg by mouth daily. 10/01/16   [provider]  metoprolol tartrate (LOPRESSOR) 25 MG tablet Take 2 tablets (50 mg total) by mouth 2 (two) times daily. 11/10/16   Adrian Saran, MD  Multiple Vitamin (MULTIVITAMIN) tablet Take 1 tablet by mouth daily.    [provider]  omeprazole (PRILOSEC) 40 MG capsule Take 40 mg by mouth daily. 04/16/19   [provider]  oxyCODONE (OXY IR/ROXICODONE) 5 MG immediate release tablet Take 1-2 tablets (5-10 mg total) by mouth every 4 (four) hours as needed for breakthrough pain. 03/07/17   Anson Oregon, PA-C  predniSONE (DELTASONE) 5 MG tablet Take 5 mg by mouth daily. 05/23/19   [provider]  PROAIR HFA 108 (90 Base) MCG/ACT inhaler Inhale 2 puffs into the lungs every 6 (six) hours as needed for wheezing or shortness of breath.  10/23/16   [provider]  QUEtiapine (SEROQUEL) 25 MG tablet Take 25 mg by mouth at bedtime.  10/01/16   [provider]    Allergies Patient has no known allergies.  Family History  Problem Relation Age of Onset  . Hypertension Mother     Social History Social History   Tobacco Use  . Smoking status: Former Smoker     Packs/day: 1.50    Types: Cigarettes    Quit date: 10/05/2015    Years since quitting: 3.8  . Smokeless tobacco: Never Used  Substance Use Topics  . Alcohol use: No  . Drug use: No    Review of Systems Constitutional: No fever/chills Eyes: No visual changes. ENT: No sore throat. Cardiovascular: Denies chest pain. Respiratory: COPD requiring oxygen.   Gastrointestinal: No abdominal pain.  No nausea, no vomiting.  No diarrhea.  No constipation. Genitourinary: Negative for dysuria. Musculoskeletal: Negative for back pain. Skin: Negative for rash. Neurological: Negative for headaches, focal weakness or numbness. Psychiatric:  Anxiety and depression. Endocrine:  Hypertension  ____________________________________________   PHYSICAL EXAM:  VITAL SIGNS: ED Triage Vitals [08/17/19 1356]  Enc Vitals Group     BP 116/73     Pulse Rate 72     Resp 18  Temp 98 F (36.7 C)     Temp Source Oral     SpO2 100 %     Weight 105 lb 6.1 oz (47.8 kg)     Height      Head Circumference      Peak Flow      Pain Score      Pain Loc      Pain Edu?      Excl. in [place]?   Constitutional: Alert and oriented. Well appearing and in no acute distress. Neck: No stridor.  No cervical spine tenderness to palpation. Hematological/Lymphatic/Immunilogical: No cervical lymphadenopathy. Cardiovascular: Normal rate, regular rhythm. Grossly normal heart sounds.  Good peripheral circulation. Respiratory: Normal respiratory effort.  No retractions. Lungs CTAB. Musculoskeletal: No obvious deformity to the lower extremities.  No ligament discrepancy.  Patient is moderate guarding palpation of the lateral left hip.  Patient has decreased range of motion with adduction of the left hip.  No lower extremity tenderness nor edema.  No joint effusions. Neurologic:  Normal speech and language. No gross focal neurologic deficits are appreciated. No gait instability. Skin:  Skin is warm, dry and intact. No rash  noted.  No abrasions or ecchymosis. Psychiatric: Mood and affect are normal. Speech and behavior are normal.  ____________________________________________   LABS (all labs ordered are listed, but only abnormal results are displayed)  Labs Reviewed - No data to display ____________________________________________  EKG   ____________________________________________  RADIOLOGY  ED MD interpretation:    Official radiology report(s): DG Hip Unilat W or Wo Pelvis 2-3 Views Left  Result Date: 08/17/2019 CLINICAL DATA:  Fall 2 days ago. EXAM: DG HIP (WITH OR WITHOUT PELVIS) 2-3V LEFT COMPARISON:  None. FINDINGS: Both hips are normal. No acute fracture. Apparent postsurgical changes right anterior iliac spine. IMPRESSION: Negative. Electronically Signed   By: Franchot Gallo M.D.   On: 08/17/2019 15:11    ____________________________________________   PROCEDURES  Procedure(s) performed (including Critical Care):  Procedures   ____________________________________________   INITIAL IMPRESSION / ASSESSMENT AND PLAN / ED COURSE  As part of my medical decision making, I reviewed the following data within the [place]     Patient presents with left hip and inguinal pain status post fall 2 days ago.  Patient did pain increased with ambulation.  Discussed negative x-ray findings with patient.  Patient physical exam consistent with contusion to the left hip.  Reviewed narcotic log and patient has adequate pain medication.  Advise adding a Lidoderm patch for pain control.  Patient advised follow-up with PCP for continued care.   Virginia [name] was evaluated in Emergency Department on 08/17/2019 for the symptoms described in the history of present illness. She was evaluated in the context of the global COVID-19 pandemic, which necessitated consideration that the patient might be at risk for infection with the SARS-CoV-2 virus that causes COVID-19. Institutional protocols  and algorithms that pertain to the evaluation of patients at risk for COVID-19 are in a state of rapid change based on information released by regulatory bodies including the CDC and federal and state organizations. These policies and algorithms were followed during the patient's care in the ED.       ____________________________________________   FINAL CLINICAL IMPRESSION(S) / ED DIAGNOSES  Final diagnoses:  Contusion of left hip, initial encounter     ED Discharge Orders         Ordered    lidocaine (LIDODERM) 5 %  Every 12 hours  08/17/19 1530           Note:  This document was prepared using Dragon voice recognition software and may include unintentional dictation errors.    Joni Reining, PA-C 08/17/19 1535    Shaune Pollack, MD 08/18/19 [phone]

## 2019-08-17 NOTE — ED Triage Notes (Signed)
Presents vis EMS  States she fell on Sunday  Having pain to tailbone  Increased pain with ambulation

## 2019-08-17 NOTE — ED Notes (Signed)
Pt waiting on family. Pt on 3L  chronically so pt remains in room until family comes. ETA about 30 mins.

## 2019-08-17 NOTE — Discharge Instructions (Signed)
Continue previous pain medications and apply Lidoderm patch as directed.

## 2019-08-18 ENCOUNTER — Inpatient Hospital Stay: Payer: Medicare Other

## 2019-08-18 ENCOUNTER — Inpatient Hospital Stay: Payer: Medicare Other | Admitting: Oncology

## 2019-08-19 ENCOUNTER — Other Ambulatory Visit: Payer: Medicare Other

## 2019-08-19 ENCOUNTER — Ambulatory Visit: Payer: Medicare Other | Admitting: Oncology

## 2019-08-19 ENCOUNTER — Ambulatory Visit: Payer: Medicare Other

## 2019-08-20 ENCOUNTER — Ambulatory Visit: Admission: RE | Admit: 2019-08-20 | Payer: Medicare Other | Source: Ambulatory Visit

## 2019-08-21 NOTE — Progress Notes (Deleted)
[place]  [phone]) [phone] [phone]  ID: Virginia [name] OB: 1951-04-03  MR#: [id number]  HUT#:[id number]  Patient Care Team: Idelle Crouch, MD as PCP - General (Internal Medicine)  CHIEF COMPLAINT: Anemia, unspecified  INTERVAL HISTORY: Patient returns to clinic today for repeat laboratory work and further evaluation.  She has noticed worsening weakness and fatigue as well as increasing shortness of breath over the past several weeks.  She continues to require oxygen 24 hours/day.  She has no neurologic complaints.  She denies any recent fevers or illnesses.  She has a good appetite and denies weight loss.  She has no chest pain, shortness of breath, cough, or hemoptysis.  She denies any nausea, vomiting, constipation, or diarrhea.  She has no melena or hematochezia.  She has no urinary complaints.  Patient offers no further specific complaints today.  REVIEW OF SYSTEMS:   Review of Systems  Constitutional: Positive for malaise/fatigue. Negative for fever and weight loss.  Respiratory: Positive for shortness of breath. Negative for cough and hemoptysis.   Cardiovascular: Negative.  Negative for chest pain and leg swelling.  Gastrointestinal: Negative.  Negative for abdominal pain, blood in stool and melena.  Genitourinary: Negative.  Negative for hematuria.  Musculoskeletal: Negative.  Negative for back pain.  Skin: Negative.  Negative for rash.  Neurological: Positive for weakness. Negative for dizziness, focal weakness and headaches.  Psychiatric/Behavioral: Negative.  The patient is not nervous/anxious.     As per HPI. Otherwise, a complete review of systems is negative.  PAST MEDICAL HISTORY: Past Medical History:  Diagnosis Date  . Anemia   . Anxiety   . Arthritis   . Atrial fibrillation ([place])   . C. difficile colitis 09/2015  . COPD (chronic obstructive pulmonary disease) ([place])   . Depression   . Dyspnea   . Dysrhythmia   .  Fibromyalgia   . H/O tracheostomy   . Hep C w/ coma, chronic ([place])   . Hypertension   . MRSA pneumonia ([place]) 2017  . On home oxygen therapy    3 L/M   . Osteoporosis   . Peripheral neuropathy   . RLS (restless legs syndrome)   . S/P percutaneous endoscopic gastrostomy (PEG) tube placement ([place]) 09/2015  . Ventilator associated pneumonia [facility]) 10/2015   [facility], [place]    PAST SURGICAL HISTORY: Past Surgical History:  Procedure Laterality Date  . ABDOMINAL HYSTERECTOMY    . BREAST SURGERY Bilateral    Breast Implants  . DILATION AND CURETTAGE OF UTERUS    . REVERSE SHOULDER ARTHROPLASTY Right 03/06/2017   Procedure: REVERSE SHOULDER ARTHROPLASTY;  Surgeon: Corky Mull, MD;  Location: [facility] ORS;  Service: Orthopedics;  Laterality: Right;  . ROTATOR CUFF REPAIR Bilateral     FAMILY HISTORY: Family History  Problem Relation Age of Onset  . Hypertension Mother     ADVANCED DIRECTIVES (Y/N):  N  HEALTH MAINTENANCE: Social History   Tobacco Use  . Smoking status: Former Smoker    Packs/day: 1.50    Types: Cigarettes    Quit date: 10/05/2015    Years since quitting: 3.8  . Smokeless tobacco: Never Used  Substance Use Topics  . Alcohol use: No  . Drug use: No     Colonoscopy:  PAP:  Bone density:  Lipid panel:  No Known Allergies  Current Outpatient Medications  Medication Sig Dispense Refill  . albuterol (PROVENTIL) (2.5 MG/3ML) 0.083% nebulizer solution Inhale into the lungs.    Marland Kitchen  amitriptyline (ELAVIL) 25 MG tablet Take 25 mg by mouth at bedtime.     Marland Kitchen apixaban (ELIQUIS) 5 MG TABS tablet Take 1 tablet (5 mg total) by mouth 2 (two) times daily. 60 tablet 0  . DALIRESP 250 MCG TABS Take 1 tablet by mouth daily.    . diazepam (VALIUM) 5 MG tablet Take 5 mg by mouth every 8 (eight) hours as needed for anxiety.   5  . diltiazem (CARDIZEM CD) 120 MG 24 hr capsule Take 120 mg by mouth daily.    Marland Kitchen estradiol (ESTRACE) 1 MG tablet Take 1 mg by mouth daily.   1  . Fluticasone-Umeclidin-Vilant 100-62.5-25 MCG/INH AEPB Inhale into the lungs.    . furosemide (LASIX) 20 MG tablet Take 20 mg by mouth daily.   1  . gabapentin (NEURONTIN) 100 MG capsule Take 100 mg by mouth 3 (three) times daily.     Marland Kitchen HYDROcodone-acetaminophen (NORCO) 10-325 MG tablet TK 1 T PO Q 6 H PRN P    . ipratropium-albuterol (DUONEB) 0.5-2.5 (3) MG/3ML SOLN Inhale into the lungs.    . lidocaine (LIDODERM) 5 % Place 1 patch onto the skin every 12 (twelve) hours. Remove & Discard patch within 12 hours or as directed by MD 10 patch 0  . lisinopril (ZESTRIL) 10 MG tablet TK 1 T PO ONCE D    . MAGNESIUM-OXIDE 400 (241.3 Mg) MG tablet Take 400 mg by mouth daily.   1  . meloxicam (MOBIC) 7.5 MG tablet Take 7.5 mg by mouth daily.  1  . metoprolol tartrate (LOPRESSOR) 25 MG tablet Take 2 tablets (50 mg total) by mouth 2 (two) times daily. 60 tablet 0  . Multiple Vitamin (MULTIVITAMIN) tablet Take 1 tablet by mouth daily.    Marland Kitchen omeprazole (PRILOSEC) 40 MG capsule Take 40 mg by mouth daily.    Marland Kitchen oxyCODONE (OXY IR/ROXICODONE) 5 MG immediate release tablet Take 1-2 tablets (5-10 mg total) by mouth every 4 (four) hours as needed for breakthrough pain. 60 tablet 0  . predniSONE (DELTASONE) 5 MG tablet Take 5 mg by mouth daily.    Marland Kitchen PROAIR HFA 108 (90 Base) MCG/ACT inhaler Inhale 2 puffs into the lungs every 6 (six) hours as needed for wheezing or shortness of breath.   11  . QUEtiapine (SEROQUEL) 25 MG tablet Take 25 mg by mouth at bedtime.   1   No current facility-administered medications for this visit.    OBJECTIVE: There were no vitals filed for this visit.   There is no height or weight on file to calculate BMI.    ECOG FS:1 - Symptomatic but completely ambulatory  General: Thin, mild respiratory distress Eyes: Pink conjunctiva, anicteric sclera. HEENT: Normocephalic, moist mucous membranes. Lungs: No audible wheezing or coughing. Heart: Regular rate and rhythm. Abdomen: Soft,  nontender, no obvious distention. Musculoskeletal: No edema, cyanosis, or clubbing. Neuro: Alert, answering all questions appropriately. Cranial nerves grossly intact. Skin: No rashes or petechiae noted. Psych: Normal affect.  LAB RESULTS:  Lab Results  Component Value Date   NA 138 04/20/2019   K 4.3 04/20/2019   CL 94 (L) 04/20/2019   CO2 33 (H) 04/20/2019   GLUCOSE 103 (H) 04/20/2019   BUN 9 04/20/2019   CREATININE 0.70 04/20/2019   CALCIUM 9.1 04/20/2019   PROT 7.2 11/09/2016   ALBUMIN 4.1 11/09/2016   AST 27 11/09/2016   ALT 19 11/09/2016   ALKPHOS 18 (L) 11/09/2016   BILITOT 0.5 11/09/2016   GFRNONAA >  60 04/20/2019   GFRAA >60 04/20/2019    Lab Results  Component Value Date   WBC 6.9 07/29/2019   NEUTROABS 4.9 07/29/2019   HGB 7.0 (L) 07/29/2019   HCT 25.7 (L) 07/29/2019   MCV 93.5 07/29/2019   PLT 268 07/29/2019     STUDIES: DG Hip Unilat W or Wo Pelvis 2-3 Views Left  Result Date: 08/17/2019 CLINICAL DATA:  Fall 2 days ago. EXAM: DG HIP (WITH OR WITHOUT PELVIS) 2-3V LEFT COMPARISON:  None. FINDINGS: Both hips are normal. No acute fracture. Apparent postsurgical changes right anterior iliac spine. IMPRESSION: Negative. Electronically Signed   By: Franchot Gallo M.D.   On: 08/17/2019 15:11    ASSESSMENT: Anemia, unspecified.  PLAN:    1.  Anemia, unspecified: Patient's hemoglobin has trended down is now 6.9.  Iron stores in November 2020 were noted to be decreased.  Previously, her reticulocyte count is inappropriately normal and erythropoietin level was only mildly elevated.  The remainder of her laboratory work including B12, folate, and hemolysis labs are all either negative or within normal limits.  ANA is also negative.  Given her lack of renal insufficiency and inappropriately normal reticulocyte count, she possibly has an underlying bone marrow disorder.  Will order peripheral blood extended MDS panel.  Patient has agreed to bone marrow biopsy.  Return to  clinic on Thursday for blood transfusion.  Patient will return to clinic 1 to 2 weeks after her bone marrow biopsy for discussion of results, additional transfusion if needed, and treatment planning necessary.  I spent a total of 30 minutes reviewing chart data, face-to-face evaluation with the patient, counseling and coordination of care as detailed above.   Patient expressed understanding and was in agreement with this plan. She also understands that She can call clinic at any time with any questions, concerns, or complaints.     Lloyd Huger, MD   08/21/2019 10:00 AM

## 2019-08-24 ENCOUNTER — Inpatient Hospital Stay: Payer: Medicare Other

## 2019-08-26 ENCOUNTER — Inpatient Hospital Stay: Payer: Medicare Other | Attending: Oncology

## 2019-08-26 ENCOUNTER — Other Ambulatory Visit: Payer: Self-pay

## 2019-08-26 DIAGNOSIS — M797 Fibromyalgia: Secondary | ICD-10-CM | POA: Insufficient documentation

## 2019-08-26 DIAGNOSIS — J449 Chronic obstructive pulmonary disease, unspecified: Secondary | ICD-10-CM | POA: Diagnosis not present

## 2019-08-26 DIAGNOSIS — D649 Anemia, unspecified: Secondary | ICD-10-CM | POA: Insufficient documentation

## 2019-08-26 DIAGNOSIS — Z87891 Personal history of nicotine dependence: Secondary | ICD-10-CM | POA: Diagnosis not present

## 2019-08-26 DIAGNOSIS — F329 Major depressive disorder, single episode, unspecified: Secondary | ICD-10-CM | POA: Diagnosis not present

## 2019-08-26 DIAGNOSIS — Z7901 Long term (current) use of anticoagulants: Secondary | ICD-10-CM | POA: Diagnosis not present

## 2019-08-26 DIAGNOSIS — I10 Essential (primary) hypertension: Secondary | ICD-10-CM | POA: Diagnosis not present

## 2019-08-26 DIAGNOSIS — B182 Chronic viral hepatitis C: Secondary | ICD-10-CM | POA: Insufficient documentation

## 2019-08-26 DIAGNOSIS — Z79899 Other long term (current) drug therapy: Secondary | ICD-10-CM | POA: Diagnosis not present

## 2019-08-26 DIAGNOSIS — I4891 Unspecified atrial fibrillation: Secondary | ICD-10-CM | POA: Diagnosis not present

## 2019-08-26 LAB — CBC WITH DIFFERENTIAL/PLATELET
Abs Immature Granulocytes: 0.04 10*3/uL (ref 0.00–0.07)
Basophils Absolute: 0.1 10*3/uL (ref 0.0–0.1)
Basophils Relative: 1 %
Eosinophils Absolute: 0.3 10*3/uL (ref 0.0–0.5)
Eosinophils Relative: 4 %
HCT: 30.3 % — ABNORMAL LOW (ref 36.0–46.0)
Hemoglobin: 8.5 g/dL — ABNORMAL LOW (ref 12.0–15.0)
Immature Granulocytes: 1 %
Lymphocytes Relative: 17 %
Lymphs Abs: 1.5 10*3/uL (ref 0.7–4.0)
MCH: 26.2 pg (ref 26.0–34.0)
MCHC: 28.1 g/dL — ABNORMAL LOW (ref 30.0–36.0)
MCV: 93.2 fL (ref 80.0–100.0)
Monocytes Absolute: 0.9 10*3/uL (ref 0.1–1.0)
Monocytes Relative: 11 %
Neutro Abs: 5.9 10*3/uL (ref 1.7–7.7)
Neutrophils Relative %: 66 %
Platelets: 303 10*3/uL (ref 150–400)
RBC: 3.25 MIL/uL — ABNORMAL LOW (ref 3.87–5.11)
RDW: 15.6 % — ABNORMAL HIGH (ref 11.5–15.5)
WBC: 8.8 10*3/uL (ref 4.0–10.5)
nRBC: 0 % (ref 0.0–0.2)

## 2019-08-26 LAB — IRON AND TIBC
Iron: 42 ug/dL (ref 28–170)
Saturation Ratios: 10 % — ABNORMAL LOW (ref 10.4–31.8)
TIBC: 410 ug/dL (ref 250–450)
UIBC: 368 ug/dL

## 2019-08-26 LAB — FERRITIN: Ferritin: 23 ng/mL (ref 11–307)

## 2019-08-26 LAB — SAMPLE TO BLOOD BANK

## 2019-08-27 ENCOUNTER — Other Ambulatory Visit: Payer: Self-pay | Admitting: Radiology

## 2019-08-27 ENCOUNTER — Ambulatory Visit: Payer: Medicare Other | Admitting: Oncology

## 2019-08-30 ENCOUNTER — Other Ambulatory Visit: Payer: Self-pay | Admitting: Oncology

## 2019-08-30 ENCOUNTER — Other Ambulatory Visit: Payer: Self-pay

## 2019-08-30 ENCOUNTER — Ambulatory Visit
Admission: RE | Admit: 2019-08-30 | Discharge: 2019-08-30 | Disposition: A | Discharge status: Home / Self Care | Payer: Medicare Other | Source: Ambulatory Visit | Attending: Oncology | Admitting: Oncology

## 2019-08-30 DIAGNOSIS — Z9981 Dependence on supplemental oxygen: Secondary | ICD-10-CM | POA: Insufficient documentation

## 2019-08-30 DIAGNOSIS — F419 Anxiety disorder, unspecified: Secondary | ICD-10-CM | POA: Insufficient documentation

## 2019-08-30 DIAGNOSIS — F329 Major depressive disorder, single episode, unspecified: Secondary | ICD-10-CM | POA: Insufficient documentation

## 2019-08-30 DIAGNOSIS — Z7901 Long term (current) use of anticoagulants: Secondary | ICD-10-CM | POA: Diagnosis not present

## 2019-08-30 DIAGNOSIS — D649 Anemia, unspecified: Secondary | ICD-10-CM

## 2019-08-30 DIAGNOSIS — Z791 Long term (current) use of non-steroidal anti-inflammatories (NSAID): Secondary | ICD-10-CM | POA: Insufficient documentation

## 2019-08-30 DIAGNOSIS — Z79899 Other long term (current) drug therapy: Secondary | ICD-10-CM | POA: Insufficient documentation

## 2019-08-30 DIAGNOSIS — J449 Chronic obstructive pulmonary disease, unspecified: Secondary | ICD-10-CM | POA: Diagnosis not present

## 2019-08-30 DIAGNOSIS — M810 Age-related osteoporosis without current pathological fracture: Secondary | ICD-10-CM | POA: Insufficient documentation

## 2019-08-30 DIAGNOSIS — I4891 Unspecified atrial fibrillation: Secondary | ICD-10-CM | POA: Insufficient documentation

## 2019-08-30 DIAGNOSIS — G2581 Restless legs syndrome: Secondary | ICD-10-CM | POA: Insufficient documentation

## 2019-08-30 DIAGNOSIS — Z87891 Personal history of nicotine dependence: Secondary | ICD-10-CM | POA: Insufficient documentation

## 2019-08-30 DIAGNOSIS — M797 Fibromyalgia: Secondary | ICD-10-CM | POA: Insufficient documentation

## 2019-08-30 LAB — CBC WITH DIFFERENTIAL/PLATELET
Abs Immature Granulocytes: 0.03 10*3/uL (ref 0.00–0.07)
Basophils Absolute: 0.1 10*3/uL (ref 0.0–0.1)
Basophils Relative: 1 %
Eosinophils Absolute: 0.3 10*3/uL (ref 0.0–0.5)
Eosinophils Relative: 4 %
HCT: 30.4 % — ABNORMAL LOW (ref 36.0–46.0)
Hemoglobin: 8.8 g/dL — ABNORMAL LOW (ref 12.0–15.0)
Immature Granulocytes: 0 %
Lymphocytes Relative: 20 %
Lymphs Abs: 1.6 10*3/uL (ref 0.7–4.0)
MCH: 26.4 pg (ref 26.0–34.0)
MCHC: 28.9 g/dL — ABNORMAL LOW (ref 30.0–36.0)
MCV: 91.3 fL (ref 80.0–100.0)
Monocytes Absolute: 0.7 10*3/uL (ref 0.1–1.0)
Monocytes Relative: 9 %
Neutro Abs: 5.2 10*3/uL (ref 1.7–7.7)
Neutrophils Relative %: 66 %
Platelets: 270 10*3/uL (ref 150–400)
RBC: 3.33 MIL/uL — ABNORMAL LOW (ref 3.87–5.11)
RDW: 15.6 % — ABNORMAL HIGH (ref 11.5–15.5)
WBC: 7.9 10*3/uL (ref 4.0–10.5)
nRBC: 0 % (ref 0.0–0.2)

## 2019-08-30 MED ORDER — HEPARIN SOD (PORK) LOCK FLUSH 100 UNIT/ML IV SOLN
INTRAVENOUS | Status: AC
  Filled 2019-08-30: qty 5

## 2019-08-30 MED ORDER — MIDAZOLAM HCL 5 MG/5ML IJ SOLN
INTRAMUSCULAR | Status: AC
  Filled 2019-08-30: qty 5

## 2019-08-30 MED ORDER — FENTANYL CITRATE (PF) 100 MCG/2ML IJ SOLN
INTRAMUSCULAR | Status: AC
  Filled 2019-08-30: qty 2

## 2019-08-30 MED ORDER — FENTANYL CITRATE (PF) 100 MCG/2ML IJ SOLN
INTRAMUSCULAR | Status: AC | PRN
  Administered 2019-08-30: 50 ug via INTRAVENOUS
  Administered 2019-08-30: 25 ug via INTRAVENOUS

## 2019-08-30 MED ORDER — MIDAZOLAM HCL 5 MG/5ML IJ SOLN
INTRAMUSCULAR | Status: AC | PRN
  Administered 2019-08-30 (×2): 1 mg via INTRAVENOUS

## 2019-08-30 MED ORDER — SODIUM CHLORIDE 0.9 % IV SOLN: INTRAVENOUS | Status: DC

## 2019-08-30 NOTE — H&P (Signed)
Chief Complaint: Patient was seen in consultation today for bone marrow biopsy.  Referring Physician(s): Finnegan,Timothy J  Supervising Physician: Markus Daft  Patient Status: [facility] - Out-pt  History of Present Illness: AARICA WAX is a 69 y.o. female with a past medical history significant for anxiety, depression, fibromyalgia, osteoporosis, RLS, hepatitis C, HTN, COPD on chronic 3L supplemental oxygen, a.fib and anemia of unknown etiology followed by Dr. Grayland Ormond who presents today for a bone marrow biopsy. Ms. Mcquown was first seen by hematology on 01/15/19 after being found to have steadily decreasing hemoglobin on routine lab work. She underwent further laboratory workup which was unrevealing as to the cause of her anemia and decision was made to proceed with a bone marrow biopsy for further evaluation.   Ms. Tilson states that she feels ok right now although she is not used to being up this early and is having a little more trouble breathing this morning because she felt rushed. She also reports 2 recent falls which have caused her left sided hip/groin pain and right sided shoulder pain in addition to her chronic back and leg pain. She did not take any pain medications this morning so she is quite uncomfortable. She state that she wears 3L of oxygen at all times and 4L when she is ambulating. She states understanding of the requested procedure and is agreeable to proceed as planned.   Past Medical History:  Diagnosis Date  . Anemia   . Anxiety   . Arthritis   . Atrial fibrillation ([place])   . C. difficile colitis 09/2015  . COPD (chronic obstructive pulmonary disease) ([place])   . Depression   . Dyspnea   . Dysrhythmia   . Fibromyalgia   . H/O tracheostomy   . Hep C w/ coma, chronic ([place])   . Hypertension   . MRSA pneumonia ([place]) 2017  . On home oxygen therapy    3 L/M   . Osteoporosis   . Peripheral neuropathy   . RLS (restless legs syndrome)   . S/P percutaneous  endoscopic gastrostomy (PEG) tube placement ([place]) 09/2015  . Ventilator associated pneumonia [facility]) 10/2015   [facility], [place]    Past Surgical History:  Procedure Laterality Date  . ABDOMINAL HYSTERECTOMY    . BREAST SURGERY Bilateral    Breast Implants  . DILATION AND CURETTAGE OF UTERUS    . REVERSE SHOULDER ARTHROPLASTY Right 03/06/2017   Procedure: REVERSE SHOULDER ARTHROPLASTY;  Surgeon: Corky Mull, MD;  Location: [facility] ORS;  Service: Orthopedics;  Laterality: Right;  . ROTATOR CUFF REPAIR Bilateral     Allergies: Patient has no known allergies.  Medications: Prior to Admission medications   Medication Sig Start Date End Date Taking? Authorizing Provider  albuterol (PROVENTIL) (2.5 MG/3ML) 0.083% nebulizer solution Inhale into the lungs. 02/09/19   [provider]  amitriptyline (ELAVIL) 25 MG tablet Take 25 mg by mouth at bedtime.  01/14/19   [provider]  apixaban (ELIQUIS) 5 MG TABS tablet Take 1 tablet (5 mg total) by mouth 2 (two) times daily. 11/10/16   Bettey Costa, MD  DALIRESP 250 MCG TABS Take 1 tablet by mouth daily. 04/22/19   [provider]  diazepam (VALIUM) 5 MG tablet Take 5 mg by mouth every 8 (eight) hours as needed for anxiety.  10/22/16   [provider]  diltiazem (CARDIZEM CD) 120 MG 24 hr capsule Take 120 mg by mouth daily. 04/29/19   [provider]  estradiol (ESTRACE) 1  MG tablet Take 1 mg by mouth daily. 09/10/16   [provider]  Fluticasone-Umeclidin-Vilant 100-62.5-25 MCG/INH AEPB Inhale into the lungs. 12/01/18   [provider]  furosemide (LASIX) 20 MG tablet Take 20 mg by mouth daily.  10/01/16   [provider]  gabapentin (NEURONTIN) 100 MG capsule Take 100 mg by mouth 3 (three) times daily.  01/06/19   [provider]  HYDROcodone-acetaminophen (NORCO) 10-325 MG tablet TK 1 T PO Q 6 H PRN P 12/23/18   [provider]  ipratropium-albuterol (DUONEB)  0.5-2.5 (3) MG/3ML SOLN Inhale into the lungs.    [provider]  lidocaine (LIDODERM) 5 % Place 1 patch onto the skin every 12 (twelve) hours. Remove & Discard patch within 12 hours or as directed by MD 08/17/19 08/16/20  Cuthriell, Charline Bills, PA-C  lisinopril (ZESTRIL) 10 MG tablet TK 1 T PO ONCE D 12/16/18   [provider]  MAGNESIUM-OXIDE 400 (241.3 Mg) MG tablet Take 400 mg by mouth daily.  08/24/16   [provider]  meloxicam (MOBIC) 7.5 MG tablet Take 7.5 mg by mouth daily. 10/01/16   [provider]  metoprolol tartrate (LOPRESSOR) 25 MG tablet Take 2 tablets (50 mg total) by mouth 2 (two) times daily. 11/10/16   Bettey Costa, MD  Multiple Vitamin (MULTIVITAMIN) tablet Take 1 tablet by mouth daily.    [provider]  omeprazole (PRILOSEC) 40 MG capsule Take 40 mg by mouth daily. 04/16/19   [provider]  oxyCODONE (OXY IR/ROXICODONE) 5 MG immediate release tablet Take 1-2 tablets (5-10 mg total) by mouth every 4 (four) hours as needed for breakthrough pain. 03/07/17   Lattie Corns, PA-C  predniSONE (DELTASONE) 5 MG tablet Take 5 mg by mouth daily. 05/23/19   [provider]  PROAIR HFA 108 (90 Base) MCG/ACT inhaler Inhale 2 puffs into the lungs every 6 (six) hours as needed for wheezing or shortness of breath.  10/23/16   [provider]  QUEtiapine (SEROQUEL) 25 MG tablet Take 25 mg by mouth at bedtime.  10/01/16   [provider]     Family History  Problem Relation Age of Onset  . Hypertension Mother     Social History   Socioeconomic History  . Marital status: Single    Spouse name: Not on file  . Number of children: Not on file  . Years of education: Not on file  . Highest education level: Not on file  Occupational History  . Not on file  Tobacco Use  . Smoking status: Former Smoker    Packs/day: 1.50    Types: Cigarettes    Quit date: 10/05/2015    Years since quitting: 3.9  . Smokeless  tobacco: Never Used  Substance and Sexual Activity  . Alcohol use: No  . Drug use: No  . Sexual activity: Not on file  Other Topics Concern  . Not on file  Social History Narrative  . Not on file   Social Determinants of Health   Financial Resource Strain:   . Difficulty of Paying Living Expenses: Not on file  Food Insecurity:   . Worried About Charity fundraiser in the Last Year: Not on file  . Ran Out of Food in the Last Year: Not on file  Transportation Needs:   . Lack of Transportation (Medical): Not on file  . Lack of Transportation (Non-Medical): Not on file  Physical Activity:   . Days of Exercise per Week:  Not on file  . Minutes of Exercise per Session: Not on file  Stress:   . Feeling of Stress : Not on file  Social Connections:   . Frequency of Communication with Friends and Family: Not on file  . Frequency of Social Gatherings with Friends and Family: Not on file  . Attends Religious Services: Not on file  . Active Member of Clubs or Organizations: Not on file  . Attends Archivist Meetings: Not on file  . Marital Status: Not on file     Review of Systems: A 12 point ROS discussed and pertinent positives are indicated in the HPI above.  All other systems are negative.  Review of Systems  Constitutional: Positive for fatigue. Negative for chills and fever.  HENT: Negative for nosebleeds.   Respiratory: Positive for shortness of breath. Negative for cough.   Cardiovascular: Negative for chest pain.  Gastrointestinal: Negative for abdominal pain, blood in stool, diarrhea, nausea and vomiting.  Genitourinary: Negative for hematuria.  Musculoskeletal: Positive for arthralgias, back pain and myalgias.  Skin: Negative for color change.  Neurological: Negative for dizziness and headaches.    Vital Signs: BP 129/79   Pulse 77   Temp 98.4 F (36.9 C) (Oral)   Resp 16   Ht '5\' 2"'  (1.575 m)   SpO2 95%   BMI 19.27 kg/m   Physical Exam Vitals  reviewed.  Constitutional:      General: She is not in acute distress.    Appearance: She is ill-appearing.  HENT:     Head: Normocephalic.     Mouth/Throat:     Mouth: Mucous membranes are moist.     Pharynx: Oropharynx is clear. No oropharyngeal exudate or posterior oropharyngeal erythema.  Cardiovascular:     Rate and Rhythm: Normal rate and regular rhythm.  Pulmonary:     Effort: Pulmonary effort is normal.     Breath sounds: Rales (bilaterally) present.     Comments: (+) 3L/min O2 via [place] Abdominal:     General: There is no distension.     Palpations: Abdomen is soft.     Tenderness: There is no abdominal tenderness.  Skin:    General: Skin is warm and dry.  Neurological:     Mental Status: She is alert and oriented to person, place, and time.  Psychiatric:        Mood and Affect: Mood normal.        Behavior: Behavior normal.        Thought Content: Thought content normal.        Judgment: Judgment normal.      MD Evaluation Airway: WNL Heart: WNL Abdomen: WNL Chest/ Lungs: WNL(3L/min O2 continuous) ASA  Classification: 3 Mallampati/Airway Score: Two   Imaging: DG Hip Unilat W or Wo Pelvis 2-3 Views Left  Result Date: 08/17/2019 CLINICAL DATA:  Fall 2 days ago. EXAM: DG HIP (WITH OR WITHOUT PELVIS) 2-3V LEFT COMPARISON:  None. FINDINGS: Both hips are normal. No acute fracture. Apparent postsurgical changes right anterior iliac spine. IMPRESSION: Negative. Electronically Signed   By: Franchot Gallo M.D.   On: 08/17/2019 15:11    Labs:  CBC: Recent Labs    05/10/19 1356 07/26/19 1348 07/29/19 0914 08/26/19 1405  WBC 8.9 9.2 6.9 8.8  HGB 7.4* 6.9* 7.0* 8.5*  HCT 26.3* 25.6* 25.7* 30.3*  PLT 190 315 268 303    COAGS: No results for input(s): INR, APTT in the last 8760 hours.  BMP: Recent Labs  04/20/19 2337  NA 138  K 4.3  CL 94*  CO2 33*  GLUCOSE 103*  BUN 9  CALCIUM 9.1  CREATININE 0.70  GFRNONAA >60  GFRAA >60    LIVER FUNCTION  TESTS: No results for input(s): BILITOT, AST, ALT, ALKPHOS, PROT, ALBUMIN in the last 8760 hours.  TUMOR MARKERS: No results for input(s): AFPTM, CEA, CA199, CHROMGRNA in the last 8760 hours.  Assessment and Plan:  69 y/o F with multiple medical problems as per HPI who presents today for a bone marrow biopsy to further evaluate her anemia of unknown etiology.  Patient has been NPO since 10 pm last night, last dose of Eliquis yesterday. Afebrile, CBC pending at time of this note writing and will be reviewed prior to procedure.  Risks and benefits of bone marrow biopsy was discussed with the patient and/or patient's family including, but not limited to bleeding, infection, damage to adjacent structures or low yield requiring additional tests.  All of the questions were answered and there is agreement to proceed.  Consent signed and in chart.   Thank you for this interesting consult.  I greatly enjoyed meeting MESSINA KOSINSKI and look forward to participating in their care.  A copy of this report was sent to the requesting provider on this date.  Electronically Signed: Joaquim Nam, PA-C 08/30/2019, 8:48 AM   I spent a total of 30 Minutes  in face to face in clinical consultation, greater than 50% of which was counseling/coordinating care for a bone marrow biopsy.

## 2019-08-30 NOTE — Procedures (Signed)
Interventional Radiology Procedure:   Indications: Anemia  Procedure: CT guided bone marrow biopsy  Findings: 2 aspirates and 2 cores from right ilium  Complications: None     EBL: Minimal, less than 10 ml  Plan: Discharge to home in one hour.   Deante Blough R. Anselm Pancoast, MD  Pager: [phone]

## 2019-08-30 NOTE — Discharge Instructions (Signed)
Bone Marrow Aspiration and Bone Marrow Biopsy, Adult, Care After This sheet gives you information about how to care for yourself after your procedure. If you have problems or questions, contact your health care provider.  What can I expect after the procedure?  After the procedure, it is common to have:  Mild pain and tenderness.  Swelling.  Bruising.  Follow these instructions at home:  Take over-the-counter or prescription medicines only as told by your health care provider.  You may shower tomorrow ? Remove band aid tomorrow, replace with another bandaid if  site has any drainage from biopsy site. ? Wash your hands with soap and water before you touch your biopsy site  If soap and water are not available, use hand sanitizer. ? Change your dressing frequently for bleeding and/or drainage.  Check your puncture site every day for signs of infection. Check for: ? More redness, swelling, or pain. ? More fluid or blood. ? Warmth. ? Pus or a bad smell.  Return to your normal activities in 24hours.   Do not drive for 24 hours if you were given a medicine to help you relax (sedative).  Keep all follow-up visits as told by your health care provider. This is important. Contact a health care provider if:  You have more redness, swelling, or pain around the puncture site.  You have more fluid or blood coming from the puncture site.  Your puncture site feels warm to the touch.  You have pus or a bad smell coming from the puncture site.  You have a fever.  Your pain is not controlled with medicine. This information is not intended to replace advice given to you by your health care provider. Make sure you discuss any questions you have with your health care provider. Document Released: 12/28/2004 Document Revised: 12/29/2015 Document Reviewed: 11/22/2015 Elsevier Interactive Patient Education  2018 [employer].

## 2019-08-31 LAB — SURGICAL PATHOLOGY

## 2019-09-06 ENCOUNTER — Telehealth: Payer: Self-pay | Admitting: *Deleted

## 2019-09-06 ENCOUNTER — Encounter (HOSPITAL_COMMUNITY): Payer: Self-pay | Admitting: Oncology

## 2019-09-06 NOTE — Telephone Encounter (Signed)
Scheduling message has been sent to get patient scheduled this week for results.

## 2019-09-06 NOTE — Telephone Encounter (Signed)
Daughter in law called asking for results of biopsy done last week. I see that she is down for conference tomorrow but she has no follow up appointment with you

## 2019-09-07 ENCOUNTER — Encounter (HOSPITAL_COMMUNITY): Payer: Self-pay | Admitting: Oncology

## 2019-09-08 ENCOUNTER — Inpatient Hospital Stay (HOSPITAL_BASED_OUTPATIENT_CLINIC_OR_DEPARTMENT_OTHER): Payer: Medicare Other | Admitting: Oncology

## 2019-09-08 ENCOUNTER — Other Ambulatory Visit: Payer: Self-pay

## 2019-09-08 DIAGNOSIS — D649 Anemia, unspecified: Secondary | ICD-10-CM | POA: Diagnosis not present

## 2019-09-08 NOTE — Progress Notes (Signed)
Pt having virtual visit for follow up for anemia.

## 2019-09-08 NOTE — Progress Notes (Signed)
[place]  [phone] [phone]) [phone]  ID: Virginia [name] OB: [birth year]  MR#: [id number]  YSH#:[id number]  Patient Care Team: Idelle Crouch, MD as PCP - General (Internal Medicine)  I connected with Virginia [name] on 09/08/19 at 10:45 AM EDT by video enabled telemedicine visit and verified that I am speaking with the correct person using two identifiers.   I discussed the limitations, risks, security and privacy concerns of performing an evaluation and management service by telemedicine and the availability of in-person appointments. I also discussed with the patient that there may be a patient responsible charge related to this service. The patient expressed understanding and agreed to proceed.   Other persons participating in the visit and their role in the encounter: Patient, MD.  Patient's location: Home. Provider's location: Clinic.  CHIEF COMPLAINT: Anemia, unspecified  INTERVAL HISTORY: Patient agreed to video enabled telemedicine visit for further evaluation and discussion of her bone marrow biopsy results.  She currently feels well and is at her baseline.  She has chronic weakness and fatigue.  She continues to have shortness of breath and requires oxygen 24 hours/day.  She has no neurologic complaints.  She denies any recent fevers or illnesses.  She has a good appetite and denies weight loss.  She has no chest pain, shortness of breath, cough, or hemoptysis.  She denies any nausea, vomiting, constipation, or diarrhea.  She has no melena or hematochezia.  She has no urinary complaints.  Patient offers no further specific complaints today.  REVIEW OF SYSTEMS:   Review of Systems  Constitutional: Positive for malaise/fatigue. Negative for fever and weight loss.  Respiratory: Positive for shortness of breath. Negative for cough and hemoptysis.   Cardiovascular: Negative.  Negative for chest pain and leg swelling.  Gastrointestinal:  Negative.  Negative for abdominal pain, blood in stool and melena.  Genitourinary: Negative.  Negative for hematuria.  Musculoskeletal: Negative.  Negative for back pain.  Skin: Negative.  Negative for rash.  Neurological: Positive for weakness. Negative for dizziness, focal weakness and headaches.  Psychiatric/Behavioral: Negative.  The patient is not nervous/anxious.     As per HPI. Otherwise, a complete review of systems is negative.  PAST MEDICAL HISTORY: Past Medical History:  Diagnosis Date  . Anemia   . Anxiety   . Arthritis   . Atrial fibrillation ([place])   . C. difficile colitis 09/2015  . COPD (chronic obstructive pulmonary disease) ([place])   . Depression   . Dyspnea   . Dysrhythmia   . Fibromyalgia   . H/O tracheostomy   . Hep C w/ coma, chronic ([place])   . Hypertension   . MRSA pneumonia ([place]) 2017  . On home oxygen therapy    3 L/M   . Osteoporosis   . Peripheral neuropathy   . RLS (restless legs syndrome)   . S/P percutaneous endoscopic gastrostomy (PEG) tube placement ([place]) 09/2015  . Ventilator associated pneumonia [facility]) 10/2015   [facility], [place]    PAST SURGICAL HISTORY: Past Surgical History:  Procedure Laterality Date  . ABDOMINAL HYSTERECTOMY    . BREAST SURGERY Bilateral    Breast Implants  . DILATION AND CURETTAGE OF UTERUS    . REVERSE SHOULDER ARTHROPLASTY Right 03/06/2017   Procedure: REVERSE SHOULDER ARTHROPLASTY;  Surgeon: Corky Mull, MD;  Location: [facility] ORS;  Service: Orthopedics;  Laterality: Right;  . ROTATOR CUFF REPAIR Bilateral     FAMILY HISTORY: Family History  Problem Relation Age of Onset  .  Hypertension Mother     ADVANCED DIRECTIVES (Y/N):  N  HEALTH MAINTENANCE: Social History   Tobacco Use  . Smoking status: Former Smoker    Packs/day: 1.50    Types: Cigarettes    Quit date: 10/05/2015    Years since quitting: 3.9  . Smokeless tobacco: Never Used  Substance Use Topics  . Alcohol use: No  . Drug use:  No     Colonoscopy:  PAP:  Bone density:  Lipid panel:  No Known Allergies  Current Outpatient Medications  Medication Sig Dispense Refill  . albuterol (PROVENTIL) (2.5 MG/3ML) 0.083% nebulizer solution Inhale into the lungs.    Marland Kitchen amitriptyline (ELAVIL) 25 MG tablet Take 25 mg by mouth at bedtime.     Marland Kitchen apixaban (ELIQUIS) 5 MG TABS tablet Take 1 tablet (5 mg total) by mouth 2 (two) times daily. 60 tablet 0  . DALIRESP 250 MCG TABS Take 1 tablet by mouth daily.    . diazepam (VALIUM) 5 MG tablet Take 5 mg by mouth every 8 (eight) hours as needed for anxiety.   5  . diltiazem (CARDIZEM CD) 120 MG 24 hr capsule Take 120 mg by mouth daily.    Marland Kitchen estradiol (ESTRACE) 1 MG tablet Take 1 mg by mouth daily.  1  . Fluticasone-Umeclidin-Vilant 100-62.5-25 MCG/INH AEPB Inhale into the lungs.    . furosemide (LASIX) 20 MG tablet Take 20 mg by mouth daily.   1  . gabapentin (NEURONTIN) 100 MG capsule Take 100 mg by mouth 3 (three) times daily.     Marland Kitchen HYDROcodone-acetaminophen (NORCO) 10-325 MG tablet TK 1 T PO Q 6 H PRN P    . ipratropium-albuterol (DUONEB) 0.5-2.5 (3) MG/3ML SOLN Inhale into the lungs.    . lidocaine (LIDODERM) 5 % Place 1 patch onto the skin every 12 (twelve) hours. Remove & Discard patch within 12 hours or as directed by MD 10 patch 0  . lisinopril (ZESTRIL) 10 MG tablet TK 1 T PO ONCE D    . MAGNESIUM-OXIDE 400 (241.3 Mg) MG tablet Take 400 mg by mouth daily.   1  . meloxicam (MOBIC) 7.5 MG tablet Take 7.5 mg by mouth daily.  1  . metoprolol tartrate (LOPRESSOR) 25 MG tablet Take 2 tablets (50 mg total) by mouth 2 (two) times daily. 60 tablet 0  . Multiple Vitamin (MULTIVITAMIN) tablet Take 1 tablet by mouth daily.    Marland Kitchen omeprazole (PRILOSEC) 40 MG capsule Take 40 mg by mouth daily.    Marland Kitchen oxyCODONE (OXY IR/ROXICODONE) 5 MG immediate release tablet Take 1-2 tablets (5-10 mg total) by mouth every 4 (four) hours as needed for breakthrough pain. 60 tablet 0  . predniSONE (DELTASONE) 5  MG tablet Take 5 mg by mouth daily.    Marland Kitchen PROAIR HFA 108 (90 Base) MCG/ACT inhaler Inhale 2 puffs into the lungs every 6 (six) hours as needed for wheezing or shortness of breath.   11  . QUEtiapine (SEROQUEL) 25 MG tablet Take 25 mg by mouth at bedtime.   1   No current facility-administered medications for this visit.    OBJECTIVE: Vitals:     There is no height or weight on file to calculate BMI.    ECOG FS:1 - Symptomatic but completely ambulatory  General: Well-developed, well-nourished, no acute distress. HEENT: Normocephalic. Neuro: Alert, answering all questions appropriately. Cranial nerves grossly intact. Psych: Normal affect.   LAB RESULTS:  Lab Results  Component Value Date   NA 138 04/20/2019  K 4.3 04/20/2019   CL 94 (L) 04/20/2019   CO2 33 (H) 04/20/2019   GLUCOSE 103 (H) 04/20/2019   BUN 9 04/20/2019   CREATININE 0.70 04/20/2019   CALCIUM 9.1 04/20/2019   PROT 7.2 11/09/2016   ALBUMIN 4.1 11/09/2016   AST 27 11/09/2016   ALT 19 11/09/2016   ALKPHOS 18 (L) 11/09/2016   BILITOT 0.5 11/09/2016   GFRNONAA >60 04/20/2019   GFRAA >60 04/20/2019    Lab Results  Component Value Date   WBC 7.9 08/30/2019   NEUTROABS 5.2 08/30/2019   HGB 8.8 (L) 08/30/2019   HCT 30.4 (L) 08/30/2019   MCV 91.3 08/30/2019   PLT 270 08/30/2019     STUDIES: CT BONE MARROW BIOPSY & ASPIRATION  Result Date: 08/30/2019 INDICATION: 68 year old with anemia.  Request for bone marrow biopsy. EXAM: CT GUIDED BONE MARROW ASPIRATES AND BIOPSY Physician: Stephan Minister. Anselm Pancoast, MD MEDICATIONS: None. ANESTHESIA/SEDATION: Fentanyl 75 mcg IV; Versed 2.0 mg IV Moderate Sedation Time:  13 minutes The patient was continuously monitored during the procedure by the interventional radiology nurse under my direct supervision. COMPLICATIONS: None immediate. PROCEDURE: The procedure was explained to the patient. The risks and benefits of the procedure were discussed and the patient's questions were addressed.  Informed consent was obtained from the patient. The patient was placed prone on CT table. Images of the pelvis were obtained. The right side of back was prepped and draped in sterile fashion. The skin and right posterior ilium were anesthetized with 1% lidocaine. 11 gauge bone needle was directed into the right ilium with CT guidance. Two aspirates and two core biopsies were obtained. Bandage placed over the puncture site. IMPRESSION: CT guided bone marrow aspiration and core biopsy. Electronically Signed   By: Markus Daft M.D.   On: 08/30/2019 10:44   DG Hip Unilat W or Wo Pelvis 2-3 Views Left  Result Date: 08/17/2019 CLINICAL DATA:  Fall 2 days ago. EXAM: DG HIP (WITH OR WITHOUT PELVIS) 2-3V LEFT COMPARISON:  None. FINDINGS: Both hips are normal. No acute fracture. Apparent postsurgical changes right anterior iliac spine. IMPRESSION: Negative. Electronically Signed   By: Franchot Gallo M.D.   On: 08/17/2019 15:11    ASSESSMENT: Anemia, unspecified.  PLAN:    1.  Anemia, unspecified: Bone marrow biopsy performed on August 30, 2019 did not report any distinct pathology.  Both cytogenetics and FISH were also reported as normal.  Patient's hemoglobin has improved to 8.8.  She has mildly decreased iron stores, but the remainder of her laboratory work is either negative or within normal limits.  No intervention is needed at this time.  Return to clinic in 6 weeks with repeat laboratory work, further evaluation, and consideration of IV iron.    I provided 30 minutes of face-to-face video visit time during this encounter which included chart review, counseling, and coordination of care as documented above.    Patient expressed understanding and was in agreement with this plan. She also understands that She can call clinic at any time with any questions, concerns, or complaints.     Lloyd Huger, MD   09/08/2019 12:17 PM

## 2019-10-16 NOTE — Progress Notes (Signed)
[place]  Telephone:(336) [phone] [phone]) [phone]  ID: Virginia [name] OB: June 27, 1950  MR#: [id number]  QQV#:[id number]  Patient Care Team: Idelle Crouch, MD as PCP - General (Internal Medicine)   CHIEF COMPLAINT: Anemia, unspecified  INTERVAL HISTORY: Patient returns to clinic today for repeat laboratory work, further evaluation, and consideration of IV Feraheme.  She continues to have chronic weakness and fatigue.  She also has chronic shortness of breath and requires oxygen 24 hours/day.  She has no neurologic complaints.  She denies any recent fevers or illnesses.  She has a good appetite and denies weight loss.  She has no chest pain, cough, or hemoptysis.  She denies any nausea, vomiting, constipation, or diarrhea.  She has no melena or hematochezia.  She has no urinary complaints.  Patient offers no further specific complaints today.  REVIEW OF SYSTEMS:   Review of Systems  Constitutional: Positive for malaise/fatigue. Negative for fever and weight loss.  Respiratory: Positive for shortness of breath. Negative for cough and hemoptysis.   Cardiovascular: Negative.  Negative for chest pain and leg swelling.  Gastrointestinal: Negative.  Negative for abdominal pain, blood in stool and melena.  Genitourinary: Negative.  Negative for hematuria.  Musculoskeletal: Negative.  Negative for back pain.  Skin: Negative.  Negative for rash.  Neurological: Positive for weakness. Negative for dizziness, focal weakness and headaches.  Psychiatric/Behavioral: Negative.  The patient is not nervous/anxious.     As per HPI. Otherwise, a complete review of systems is negative.  PAST MEDICAL HISTORY: Past Medical History:  Diagnosis Date  . Anemia   . Anxiety   . Arthritis   . Atrial fibrillation ([place])   . C. difficile colitis 09/2015  . COPD (chronic obstructive pulmonary disease) ([place])   . Depression   . Dyspnea   . Dysrhythmia   . Fibromyalgia   . H/O  tracheostomy   . Hep C w/ coma, chronic ([place])   . Hypertension   . MRSA pneumonia ([place]) 2017  . On home oxygen therapy    3 L/M   . Osteoporosis   . Peripheral neuropathy   . RLS (restless legs syndrome)   . S/P percutaneous endoscopic gastrostomy (PEG) tube placement ([place]) 09/2015  . Ventilator associated pneumonia [facility]) 10/2015   [facility], [place]    PAST SURGICAL HISTORY: Past Surgical History:  Procedure Laterality Date  . ABDOMINAL HYSTERECTOMY    . BREAST SURGERY Bilateral    Breast Implants  . DILATION AND CURETTAGE OF UTERUS    . REVERSE SHOULDER ARTHROPLASTY Right 03/06/2017   Procedure: REVERSE SHOULDER ARTHROPLASTY;  Surgeon: Corky Mull, MD;  Location: [facility] ORS;  Service: Orthopedics;  Laterality: Right;  . ROTATOR CUFF REPAIR Bilateral     FAMILY HISTORY: Family History  Problem Relation Age of Onset  . Hypertension Mother     ADVANCED DIRECTIVES (Y/N):  N  HEALTH MAINTENANCE: Social History   Tobacco Use  . Smoking status: Former Smoker    Packs/day: 1.50    Types: Cigarettes    Quit date: 10/05/2015    Years since quitting: 4.0  . Smokeless tobacco: Never Used  Substance Use Topics  . Alcohol use: No  . Drug use: No     Colonoscopy:  PAP:  Bone density:  Lipid panel:  No Known Allergies  Current Outpatient Medications  Medication Sig Dispense Refill  . albuterol (PROVENTIL) (2.5 MG/3ML) 0.083% nebulizer solution Inhale into the lungs.    Marland Kitchen amitriptyline (ELAVIL) 25 MG  tablet Take 25 mg by mouth at bedtime.     Marland Kitchen apixaban (ELIQUIS) 5 MG TABS tablet Take 1 tablet (5 mg total) by mouth 2 (two) times daily. 60 tablet 0  . diazepam (VALIUM) 5 MG tablet Take 5 mg by mouth every 8 (eight) hours as needed for anxiety.   5  . diltiazem (CARDIZEM CD) 120 MG 24 hr capsule Take 120 mg by mouth daily.    Marland Kitchen estradiol (ESTRACE) 1 MG tablet Take 1 mg by mouth daily.  1  . Fluticasone-Umeclidin-Vilant 100-62.5-25 MCG/INH AEPB Inhale into the  lungs.    . furosemide (LASIX) 20 MG tablet Take 20 mg by mouth daily.   1  . gabapentin (NEURONTIN) 100 MG capsule Take 100 mg by mouth 3 (three) times daily.     Marland Kitchen HYDROcodone-acetaminophen (NORCO) 10-325 MG tablet TK 1 T PO Q 6 H PRN P    . lisinopril (ZESTRIL) 10 MG tablet TK 1 T PO ONCE D    . MAGNESIUM-OXIDE 400 (241.3 Mg) MG tablet Take 400 mg by mouth daily.   1  . metoprolol tartrate (LOPRESSOR) 25 MG tablet Take 2 tablets (50 mg total) by mouth 2 (two) times daily. 60 tablet 0  . Multiple Vitamin (MULTIVITAMIN) tablet Take 1 tablet by mouth daily.    Marland Kitchen omeprazole (PRILOSEC) 40 MG capsule Take 40 mg by mouth daily.    . predniSONE (DELTASONE) 5 MG tablet Take 5 mg by mouth daily.    Marland Kitchen PROAIR HFA 108 (90 Base) MCG/ACT inhaler Inhale 2 puffs into the lungs every 6 (six) hours as needed for wheezing or shortness of breath.   11  . QUEtiapine (SEROQUEL) 25 MG tablet Take 25 mg by mouth at bedtime.   1   No current facility-administered medications for this visit.    OBJECTIVE: Vitals:   10/20/19 1058  BP: 122/85  Pulse: 74  Resp: 20  Temp: (!) 96.5 F (35.8 C)  SpO2: 100%     Body mass index is 20.83 kg/m.    ECOG FS:1 - Symptomatic but completely ambulatory  General: Well-developed, well-nourished, no acute distress. Eyes: Pink conjunctiva, anicteric sclera. HEENT: Normocephalic, moist mucous membranes. Lungs: No audible wheezing or coughing. Heart: Regular rate and rhythm. Abdomen: Soft, nontender, no obvious distention. Musculoskeletal: No edema, cyanosis, or clubbing. Neuro: Alert, answering all questions appropriately. Cranial nerves grossly intact. Skin: No rashes or petechiae noted. Psych: Normal affect.   LAB RESULTS:  Lab Results  Component Value Date   NA 138 04/20/2019   K 4.3 04/20/2019   CL 94 (L) 04/20/2019   CO2 33 (H) 04/20/2019   GLUCOSE 103 (H) 04/20/2019   BUN 9 04/20/2019   CREATININE 0.70 04/20/2019   CALCIUM 9.1 04/20/2019   PROT 7.2  11/09/2016   ALBUMIN 4.1 11/09/2016   AST 27 11/09/2016   ALT 19 11/09/2016   ALKPHOS 18 (L) 11/09/2016   BILITOT 0.5 11/09/2016   GFRNONAA >60 04/20/2019   GFRAA >60 04/20/2019    Lab Results  Component Value Date   WBC 7.2 10/20/2019   NEUTROABS 4.7 10/20/2019   HGB 7.4 (L) 10/20/2019   HCT 26.4 (L) 10/20/2019   MCV 93.6 10/20/2019   PLT 308 10/20/2019   Lab Results  Component Value Date   IRON 20 (L) 10/20/2019   TIBC 406 10/20/2019   IRONPCTSAT 5 (L) 10/20/2019   Lab Results  Component Value Date   FERRITIN 9 (L) 10/20/2019     STUDIES: No  results found.  ASSESSMENT: Anemia, unspecified.  PLAN:    1.  Anemia, unspecified: Bone marrow biopsy performed on August 30, 2019 did not report any distinct pathology.  Both cytogenetics and FISH were also reported as normal.  Patient's hemoglobin has trended down and she has significantly reduced iron stores.  Previously, the remainder of her laboratory work is either negative or within normal limits.  Proceed with 510 mg IV Feraheme today.  Return to clinic in 1 week for second infusion.  Patient will then return to clinic in 2 months with repeat laboratory work, further evaluation, and consideration of additional treatment.  I spent a total of 30 minutes reviewing chart data, face-to-face evaluation with the patient, counseling and coordination of care as detailed above.   Patient expressed understanding and was in agreement with this plan. She also understands that She can call clinic at any time with any questions, concerns, or complaints.     Lloyd Huger, MD   10/21/2019 10:11 AM

## 2019-10-19 ENCOUNTER — Other Ambulatory Visit: Payer: Self-pay | Admitting: Emergency Medicine

## 2019-10-19 ENCOUNTER — Encounter: Payer: Self-pay | Admitting: Oncology

## 2019-10-19 ENCOUNTER — Other Ambulatory Visit: Payer: Self-pay

## 2019-10-19 DIAGNOSIS — D649 Anemia, unspecified: Secondary | ICD-10-CM

## 2019-10-19 NOTE — Progress Notes (Signed)
Pt reports a lot of pain in her hip after a fall, and also reports more back pain after having BMBx. Reports no question or concerns.

## 2019-10-20 ENCOUNTER — Inpatient Hospital Stay: Payer: Medicare Other | Attending: Oncology

## 2019-10-20 ENCOUNTER — Inpatient Hospital Stay: Payer: Medicare Other

## 2019-10-20 ENCOUNTER — Inpatient Hospital Stay (HOSPITAL_BASED_OUTPATIENT_CLINIC_OR_DEPARTMENT_OTHER): Payer: Medicare Other | Admitting: Oncology

## 2019-10-20 VITALS — BP 116/72 | HR 59 | Resp 18

## 2019-10-20 VITALS — BP 122/85 | HR 74 | Temp 96.5°F | Resp 20 | Wt 113.9 lb

## 2019-10-20 DIAGNOSIS — D649 Anemia, unspecified: Secondary | ICD-10-CM

## 2019-10-20 DIAGNOSIS — R531 Weakness: Secondary | ICD-10-CM | POA: Diagnosis not present

## 2019-10-20 DIAGNOSIS — Z79899 Other long term (current) drug therapy: Secondary | ICD-10-CM | POA: Insufficient documentation

## 2019-10-20 DIAGNOSIS — J449 Chronic obstructive pulmonary disease, unspecified: Secondary | ICD-10-CM | POA: Insufficient documentation

## 2019-10-20 DIAGNOSIS — Z87891 Personal history of nicotine dependence: Secondary | ICD-10-CM | POA: Insufficient documentation

## 2019-10-20 LAB — SAMPLE TO BLOOD BANK

## 2019-10-20 LAB — CBC WITH DIFFERENTIAL/PLATELET
Abs Immature Granulocytes: 0.02 10*3/uL (ref 0.00–0.07)
Basophils Absolute: 0.1 10*3/uL (ref 0.0–0.1)
Basophils Relative: 1 %
Eosinophils Absolute: 0.2 10*3/uL (ref 0.0–0.5)
Eosinophils Relative: 3 %
HCT: 26.4 % — ABNORMAL LOW (ref 36.0–46.0)
Hemoglobin: 7.4 g/dL — ABNORMAL LOW (ref 12.0–15.0)
Immature Granulocytes: 0 %
Lymphocytes Relative: 19 %
Lymphs Abs: 1.3 10*3/uL (ref 0.7–4.0)
MCH: 26.2 pg (ref 26.0–34.0)
MCHC: 28 g/dL — ABNORMAL LOW (ref 30.0–36.0)
MCV: 93.6 fL (ref 80.0–100.0)
Monocytes Absolute: 0.8 10*3/uL (ref 0.1–1.0)
Monocytes Relative: 11 %
Neutro Abs: 4.7 10*3/uL (ref 1.7–7.7)
Neutrophils Relative %: 66 %
Platelets: 308 10*3/uL (ref 150–400)
RBC: 2.82 MIL/uL — ABNORMAL LOW (ref 3.87–5.11)
RDW: 15.2 % (ref 11.5–15.5)
WBC: 7.2 10*3/uL (ref 4.0–10.5)
nRBC: 0 % (ref 0.0–0.2)

## 2019-10-20 LAB — IRON AND TIBC
Iron: 20 ug/dL — ABNORMAL LOW (ref 28–170)
Saturation Ratios: 5 % — ABNORMAL LOW (ref 10.4–31.8)
TIBC: 406 ug/dL (ref 250–450)
UIBC: 386 ug/dL

## 2019-10-20 LAB — FERRITIN: Ferritin: 9 ng/mL — ABNORMAL LOW (ref 11–307)

## 2019-10-20 MED ORDER — SODIUM CHLORIDE 0.9 % IV SOLN
510.0000 mg | Freq: Once | INTRAVENOUS | Status: AC
  Administered 2019-10-20: 510 mg via INTRAVENOUS
  Filled 2019-10-20: qty 510

## 2019-10-20 MED ORDER — SODIUM CHLORIDE 0.9 % IV SOLN
Freq: Once | INTRAVENOUS | Status: AC
  Filled 2019-10-20: qty 250

## 2019-10-28 ENCOUNTER — Inpatient Hospital Stay: Payer: Medicare Other | Attending: Oncology

## 2019-10-28 ENCOUNTER — Other Ambulatory Visit: Payer: Self-pay

## 2019-10-28 VITALS — BP 118/76 | HR 64 | Temp 96.2°F | Resp 20

## 2019-10-28 DIAGNOSIS — D509 Iron deficiency anemia, unspecified: Secondary | ICD-10-CM | POA: Diagnosis present

## 2019-10-28 DIAGNOSIS — D649 Anemia, unspecified: Secondary | ICD-10-CM

## 2019-10-28 MED ORDER — SODIUM CHLORIDE 0.9 % IV SOLN
Freq: Once | INTRAVENOUS | Status: AC
  Filled 2019-10-28: qty 250

## 2019-10-28 MED ORDER — SODIUM CHLORIDE 0.9 % IV SOLN
510.0000 mg | Freq: Once | INTRAVENOUS | Status: AC
  Administered 2019-10-28: 510 mg via INTRAVENOUS
  Filled 2019-10-28: qty 510

## 2019-11-25 ENCOUNTER — Other Ambulatory Visit: Payer: Self-pay | Admitting: Sports Medicine

## 2019-11-25 ENCOUNTER — Other Ambulatory Visit (HOSPITAL_COMMUNITY): Payer: Self-pay | Admitting: Sports Medicine

## 2019-11-25 DIAGNOSIS — M25551 Pain in right hip: Secondary | ICD-10-CM

## 2019-11-25 DIAGNOSIS — W19XXXA Unspecified fall, initial encounter: Secondary | ICD-10-CM

## 2019-12-09 ENCOUNTER — Ambulatory Visit
Admission: RE | Admit: 2019-12-09 | Discharge: 2019-12-09 | Disposition: A | Discharge status: Home / Self Care | Payer: Medicare Other | Source: Ambulatory Visit | Attending: Sports Medicine | Admitting: Sports Medicine

## 2019-12-09 ENCOUNTER — Other Ambulatory Visit: Payer: Self-pay

## 2019-12-09 DIAGNOSIS — S32592A Other specified fracture of left pubis, initial encounter for closed fracture: Secondary | ICD-10-CM | POA: Diagnosis not present

## 2019-12-09 DIAGNOSIS — M160 Bilateral primary osteoarthritis of hip: Secondary | ICD-10-CM | POA: Diagnosis not present

## 2019-12-09 DIAGNOSIS — W19XXXA Unspecified fall, initial encounter: Secondary | ICD-10-CM | POA: Diagnosis not present

## 2019-12-09 DIAGNOSIS — R609 Edema, unspecified: Secondary | ICD-10-CM | POA: Insufficient documentation

## 2019-12-09 DIAGNOSIS — Y929 Unspecified place or not applicable: Secondary | ICD-10-CM | POA: Insufficient documentation

## 2019-12-09 DIAGNOSIS — M25551 Pain in right hip: Secondary | ICD-10-CM | POA: Insufficient documentation

## 2019-12-09 DIAGNOSIS — Y939 Activity, unspecified: Secondary | ICD-10-CM | POA: Insufficient documentation

## 2020-01-03 ENCOUNTER — Other Ambulatory Visit (HOSPITAL_COMMUNITY): Payer: Self-pay | Admitting: Neurology

## 2020-01-03 DIAGNOSIS — G3184 Mild cognitive impairment, so stated: Secondary | ICD-10-CM

## 2020-01-17 NOTE — Progress Notes (Signed)
[place]  [phone] Fax:(336) [phone]  ID: [name] OB: [birth year]  MR#: [id number]  POE#:[id number]  Patient Care Team: Idelle Crouch, MD as PCP - General (Internal Medicine)   CHIEF COMPLAINT: Iron deficiency anemia.  INTERVAL HISTORY: Patient returns to clinic today for repeat laboratory, further evaluation, and consideration of additional IV Feraheme.  She continues to have chronic weakness and fatigue.  She also has chronic shortness of breath and requires oxygen 24 hours/day.  She otherwise feels well. She has no neurologic complaints.  She denies any recent fevers or illnesses.  She has a good appetite and denies weight loss.  She has no chest pain, cough, or hemoptysis.  She denies any nausea, vomiting, constipation, or diarrhea.  She has no melena or hematochezia.  She has no urinary complaints.  Patient offers no further specific complaints today.  REVIEW OF SYSTEMS:   Review of Systems  Constitutional: Positive for malaise/fatigue. Negative for fever and weight loss.  Respiratory: Positive for shortness of breath. Negative for cough and hemoptysis.   Cardiovascular: Negative.  Negative for chest pain and leg swelling.  Gastrointestinal: Negative.  Negative for abdominal pain, blood in stool and melena.  Genitourinary: Negative.  Negative for hematuria.  Musculoskeletal: Negative.  Negative for back pain.  Skin: Negative.  Negative for rash.  Neurological: Positive for weakness. Negative for dizziness, focal weakness and headaches.  Psychiatric/Behavioral: Negative.  The patient is not nervous/anxious.     As per HPI. Otherwise, a complete review of systems is negative.  PAST MEDICAL HISTORY: Past Medical History:  Diagnosis Date  . Anemia   . Anxiety   . Arthritis   . Atrial fibrillation ([place])   . C. difficile colitis 09/2015  . COPD (chronic obstructive pulmonary disease) ([place])   . Depression   . Dyspnea   .  Dysrhythmia   . Fibromyalgia   . H/O tracheostomy   . Hep C w/ coma, chronic ([place])   . Hypertension   . MRSA pneumonia ([place]) 2017  . On home oxygen therapy    3 L/M   . Osteoporosis   . Peripheral neuropathy   . RLS (restless legs syndrome)   . S/P percutaneous endoscopic gastrostomy (PEG) tube placement ([place]) 09/2015  . Ventilator associated pneumonia [facility]) 10/2015   [facility], [place]    PAST SURGICAL HISTORY: Past Surgical History:  Procedure Laterality Date  . ABDOMINAL HYSTERECTOMY    . BREAST SURGERY Bilateral    Breast Implants  . DILATION AND CURETTAGE OF UTERUS    . REVERSE SHOULDER ARTHROPLASTY Right 03/06/2017   Procedure: REVERSE SHOULDER ARTHROPLASTY;  Surgeon: Corky Mull, MD;  Location: [facility] ORS;  Service: Orthopedics;  Laterality: Right;  . ROTATOR CUFF REPAIR Bilateral     FAMILY HISTORY: Family History  Problem Relation Age of Onset  . Hypertension Mother     ADVANCED DIRECTIVES (Y/N):  N  HEALTH MAINTENANCE: Social History   Tobacco Use  . Smoking status: Former Smoker    Packs/day: 1.50    Types: Cigarettes    Quit date: 10/05/2015    Years since quitting: 4.3  . Smokeless tobacco: Never Used  Vaping Use  . Vaping Use: Some days  . Last attempt to quit: 10/05/2015  Substance Use Topics  . Alcohol use: No  . Drug use: No     Colonoscopy:  PAP:  Bone density:  Lipid panel:  No Known Allergies  Current Outpatient Medications  Medication Sig Dispense Refill  .  albuterol (PROVENTIL) (2.5 MG/3ML) 0.083% nebulizer solution Inhale into the lungs.    Marland Kitchen amitriptyline (ELAVIL) 25 MG tablet Take 25 mg by mouth at bedtime.     Marland Kitchen apixaban (ELIQUIS) 5 MG TABS tablet Take 1 tablet (5 mg total) by mouth 2 (two) times daily. 60 tablet 0  . diazepam (VALIUM) 5 MG tablet Take 5 mg by mouth every 8 (eight) hours as needed for anxiety.   5  . diltiazem (CARDIZEM CD) 120 MG 24 hr capsule Take 120 mg by mouth daily.    Marland Kitchen estradiol (ESTRACE) 1 MG  tablet Take 1 mg by mouth daily.  1  . Fluticasone-Umeclidin-Vilant 100-62.5-25 MCG/INH AEPB Inhale into the lungs.    . furosemide (LASIX) 20 MG tablet Take 20 mg by mouth daily.   1  . gabapentin (NEURONTIN) 100 MG capsule Take 100 mg by mouth 3 (three) times daily.     Marland Kitchen HYDROcodone-acetaminophen (NORCO) 10-325 MG tablet TK 1 T PO Q 6 H PRN P    . lisinopril (ZESTRIL) 10 MG tablet TK 1 T PO ONCE D    . MAGNESIUM-OXIDE 400 (241.3 Mg) MG tablet Take 400 mg by mouth daily.   1  . metoprolol tartrate (LOPRESSOR) 25 MG tablet Take 2 tablets (50 mg total) by mouth 2 (two) times daily. 60 tablet 0  . Multiple Vitamin (MULTIVITAMIN) tablet Take 1 tablet by mouth daily.    Marland Kitchen omeprazole (PRILOSEC) 40 MG capsule Take 40 mg by mouth daily.    . predniSONE (DELTASONE) 5 MG tablet Take 5 mg by mouth daily.    Marland Kitchen PROAIR HFA 108 (90 Base) MCG/ACT inhaler Inhale 2 puffs into the lungs every 6 (six) hours as needed for wheezing or shortness of breath.   11  . QUEtiapine (SEROQUEL) 25 MG tablet Take 25 mg by mouth at bedtime.   1   No current facility-administered medications for this visit.    OBJECTIVE: Vitals:   01/20/20 1439  BP: 120/76  Pulse: 53  Temp: (!) 97.1 F (36.2 C)     Body mass index is 22.5 kg/m.    ECOG FS:1 - Symptomatic but completely ambulatory  General: Well-developed, well-nourished, no acute distress.  Sitting in a wheelchair. Eyes: Pink conjunctiva, anicteric sclera. HEENT: Normocephalic, moist mucous membranes. Lungs: No audible wheezing or coughing. Heart: Regular rate and rhythm. Abdomen: Soft, nontender, no obvious distention. Musculoskeletal: No edema, cyanosis, or clubbing. Neuro: Alert, answering all questions appropriately. Cranial nerves grossly intact. Skin: No rashes or petechiae noted. Psych: Normal affect.   LAB RESULTS:  Lab Results  Component Value Date   NA 138 04/20/2019   K 4.3 04/20/2019   CL 94 (L) 04/20/2019   CO2 33 (H) 04/20/2019   GLUCOSE  103 (H) 04/20/2019   BUN 9 04/20/2019   CREATININE 0.70 04/20/2019   CALCIUM 9.1 04/20/2019   PROT 7.2 11/09/2016   ALBUMIN 4.1 11/09/2016   AST 27 11/09/2016   ALT 19 11/09/2016   ALKPHOS 18 (L) 11/09/2016   BILITOT 0.5 11/09/2016   GFRNONAA >60 04/20/2019   GFRAA >60 04/20/2019    Lab Results  Component Value Date   WBC 5.3 01/20/2020   NEUTROABS 3.3 01/20/2020   HGB 9.4 (L) 01/20/2020   HCT 30.1 (L) 01/20/2020   MCV 95.6 01/20/2020   PLT 254 01/20/2020   Lab Results  Component Value Date   IRON 67 01/20/2020   TIBC 216 (L) 01/20/2020   IRONPCTSAT 31 01/20/2020   Lab  Results  Component Value Date   FERRITIN 300 01/20/2020     STUDIES: No results found.  ASSESSMENT: Iron deficiency anemia.  PLAN:    1.  Iron deficiency anemia: Bone marrow biopsy performed on August 30, 2019 did not report any distinct pathology.  Both cytogenetics and FISH were also reported as normal.  Patient's hemoglobin remains decreased, but significantly improved over previous.  Iron stores are now within normal limits, but despite this we'll proceed with 510 mg IV Feraheme today.  She does not require second infusion.  Return to clinic in 3 months with repeat laboratory work, further evaluation, and continuation of treatment if needed. 2.  Shortness of breath: Continue oxygen as prescribed.  Follow-up with pulmonology as scheduled.  I spent a total of 30 minutes reviewing chart data, face-to-face evaluation with the patient, counseling and coordination of care as detailed above.    Patient expressed understanding and was in agreement with this plan. She also understands that She can call clinic at any time with any questions, concerns, or complaints.     Lloyd Huger, MD   01/22/2020 9:22 AM

## 2020-01-20 ENCOUNTER — Other Ambulatory Visit: Payer: Self-pay

## 2020-01-20 ENCOUNTER — Inpatient Hospital Stay: Payer: Medicare Other

## 2020-01-20 ENCOUNTER — Encounter: Payer: Self-pay | Admitting: Oncology

## 2020-01-20 ENCOUNTER — Inpatient Hospital Stay: Payer: Medicare Other | Attending: Oncology

## 2020-01-20 ENCOUNTER — Inpatient Hospital Stay (HOSPITAL_BASED_OUTPATIENT_CLINIC_OR_DEPARTMENT_OTHER): Payer: Medicare Other | Admitting: Oncology

## 2020-01-20 VITALS — BP 106/74 | HR 55

## 2020-01-20 DIAGNOSIS — D649 Anemia, unspecified: Secondary | ICD-10-CM

## 2020-01-20 DIAGNOSIS — D509 Iron deficiency anemia, unspecified: Secondary | ICD-10-CM | POA: Diagnosis present

## 2020-01-20 LAB — CBC WITH DIFFERENTIAL/PLATELET
Abs Immature Granulocytes: 0.02 10*3/uL (ref 0.00–0.07)
Basophils Absolute: 0 10*3/uL (ref 0.0–0.1)
Basophils Relative: 1 %
Eosinophils Absolute: 0.3 10*3/uL (ref 0.0–0.5)
Eosinophils Relative: 5 %
HCT: 30.1 % — ABNORMAL LOW (ref 36.0–46.0)
Hemoglobin: 9.4 g/dL — ABNORMAL LOW (ref 12.0–15.0)
Immature Granulocytes: 0 %
Lymphocytes Relative: 22 %
Lymphs Abs: 1.2 10*3/uL (ref 0.7–4.0)
MCH: 29.8 pg (ref 26.0–34.0)
MCHC: 31.2 g/dL (ref 30.0–36.0)
MCV: 95.6 fL (ref 80.0–100.0)
Monocytes Absolute: 0.5 10*3/uL (ref 0.1–1.0)
Monocytes Relative: 10 %
Neutro Abs: 3.3 10*3/uL (ref 1.7–7.7)
Neutrophils Relative %: 62 %
Platelets: 254 10*3/uL (ref 150–400)
RBC: 3.15 MIL/uL — ABNORMAL LOW (ref 3.87–5.11)
RDW: 14.4 % (ref 11.5–15.5)
WBC: 5.3 10*3/uL (ref 4.0–10.5)
nRBC: 0 % (ref 0.0–0.2)

## 2020-01-20 LAB — IRON AND TIBC
Iron: 67 ug/dL (ref 28–170)
Saturation Ratios: 31 % (ref 10.4–31.8)
TIBC: 216 ug/dL — ABNORMAL LOW (ref 250–450)
UIBC: 149 ug/dL

## 2020-01-20 LAB — FERRITIN: Ferritin: 300 ng/mL (ref 11–307)

## 2020-01-20 MED ORDER — SODIUM CHLORIDE 0.9 % IV SOLN
Freq: Once | INTRAVENOUS | Status: AC
  Filled 2020-01-20: qty 250

## 2020-01-20 MED ORDER — SODIUM CHLORIDE 0.9 % IV SOLN
510.0000 mg | Freq: Once | INTRAVENOUS | Status: AC
  Administered 2020-01-20: 510 mg via INTRAVENOUS
  Filled 2020-01-20: qty 510

## 2020-01-20 NOTE — Progress Notes (Signed)
Patient denies any concerns today.  

## 2020-01-22 DIAGNOSIS — D509 Iron deficiency anemia, unspecified: Secondary | ICD-10-CM | POA: Insufficient documentation

## 2020-01-25 ENCOUNTER — Encounter (HOSPITAL_COMMUNITY): Payer: Self-pay

## 2020-01-25 ENCOUNTER — Ambulatory Visit (HOSPITAL_COMMUNITY): Payer: Medicare Other

## 2020-01-27 ENCOUNTER — Inpatient Hospital Stay: Payer: Medicare Other

## 2020-04-22 NOTE — Progress Notes (Deleted)
[name]  Telephone:(336) [phone] [phone]) [phone]  ID: [name] OB: 02-11-1951  MR#: [id number]  JQG#:[id number]  Patient Care Team: Idelle Crouch, MD as PCP - General (Internal Medicine)   CHIEF COMPLAINT: Iron deficiency anemia.  INTERVAL HISTORY: Patient returns to clinic today for repeat laboratory, further evaluation, and consideration of additional IV Feraheme.  She continues to have chronic weakness and fatigue.  She also has chronic shortness of breath and requires oxygen 24 hours/day.  She otherwise feels well. She has no neurologic complaints.  She denies any recent fevers or illnesses.  She has a good appetite and denies weight loss.  She has no chest pain, cough, or hemoptysis.  She denies any nausea, vomiting, constipation, or diarrhea.  She has no melena or hematochezia.  She has no urinary complaints.  Patient offers no further specific complaints today.  REVIEW OF SYSTEMS:   Review of Systems  Constitutional: Positive for malaise/fatigue. Negative for fever and weight loss.  Respiratory: Positive for shortness of breath. Negative for cough and hemoptysis.   Cardiovascular: Negative.  Negative for chest pain and leg swelling.  Gastrointestinal: Negative.  Negative for abdominal pain, blood in stool and melena.  Genitourinary: Negative.  Negative for hematuria.  Musculoskeletal: Negative.  Negative for back pain.  Skin: Negative.  Negative for rash.  Neurological: Positive for weakness. Negative for dizziness, focal weakness and headaches.  Psychiatric/Behavioral: Negative.  The patient is not nervous/anxious.     As per HPI. Otherwise, a complete review of systems is negative.  PAST MEDICAL HISTORY: Past Medical History:  Diagnosis Date  . Anemia   . Anxiety   . Arthritis   . Atrial fibrillation ([place])   . C. difficile colitis 09/2015  . COPD (chronic obstructive pulmonary disease) ([place])   . Depression   . Dyspnea   .  Dysrhythmia   . Fibromyalgia   . H/O tracheostomy   . Hep C w/ coma, chronic ([place])   . Hypertension   . MRSA pneumonia ([place]) 2017  . On home oxygen therapy    3 L/M   . Osteoporosis   . Peripheral neuropathy   . RLS (restless legs syndrome)   . S/P percutaneous endoscopic gastrostomy (PEG) tube placement ([place]) 09/2015  . Ventilator associated pneumonia [facility]) 10/2015   [facility], [place]    PAST SURGICAL HISTORY: Past Surgical History:  Procedure Laterality Date  . ABDOMINAL HYSTERECTOMY    . BREAST SURGERY Bilateral    Breast Implants  . DILATION AND CURETTAGE OF UTERUS    . REVERSE SHOULDER ARTHROPLASTY Right 03/06/2017   Procedure: REVERSE SHOULDER ARTHROPLASTY;  Surgeon: Corky Mull, MD;  Location: [facility] ORS;  Service: Orthopedics;  Laterality: Right;  . ROTATOR CUFF REPAIR Bilateral     FAMILY HISTORY: Family History  Problem Relation Age of Onset  . Hypertension Mother     ADVANCED DIRECTIVES (Y/N):  N  HEALTH MAINTENANCE: Social History   Tobacco Use  . Smoking status: Former Smoker    Packs/day: 1.50    Types: Cigarettes    Quit date: 10/05/2015    Years since quitting: 4.5  . Smokeless tobacco: Never Used  Vaping Use  . Vaping Use: Some days  . Last attempt to quit: 10/05/2015  Substance Use Topics  . Alcohol use: No  . Drug use: No     Colonoscopy:  PAP:  Bone density:  Lipid panel:  No Known Allergies  Current Outpatient Medications  Medication Sig Dispense Refill  .  albuterol (PROVENTIL) (2.5 MG/3ML) 0.083% nebulizer solution Inhale into the lungs.    Marland Kitchen amitriptyline (ELAVIL) 25 MG tablet Take 25 mg by mouth at bedtime.     Marland Kitchen apixaban (ELIQUIS) 5 MG TABS tablet Take 1 tablet (5 mg total) by mouth 2 (two) times daily. 60 tablet 0  . diazepam (VALIUM) 5 MG tablet Take 5 mg by mouth every 8 (eight) hours as needed for anxiety.   5  . diltiazem (CARDIZEM CD) 120 MG 24 hr capsule Take 120 mg by mouth daily.    Marland Kitchen estradiol (ESTRACE) 1 MG  tablet Take 1 mg by mouth daily.  1  . Fluticasone-Umeclidin-Vilant 100-62.5-25 MCG/INH AEPB Inhale into the lungs.    . furosemide (LASIX) 20 MG tablet Take 20 mg by mouth daily.   1  . gabapentin (NEURONTIN) 100 MG capsule Take 100 mg by mouth 3 (three) times daily.     Marland Kitchen HYDROcodone-acetaminophen (NORCO) 10-325 MG tablet TK 1 T PO Q 6 H PRN P    . lisinopril (ZESTRIL) 10 MG tablet TK 1 T PO ONCE D    . MAGNESIUM-OXIDE 400 (241.3 Mg) MG tablet Take 400 mg by mouth daily.   1  . metoprolol tartrate (LOPRESSOR) 25 MG tablet Take 2 tablets (50 mg total) by mouth 2 (two) times daily. 60 tablet 0  . Multiple Vitamin (MULTIVITAMIN) tablet Take 1 tablet by mouth daily.    Marland Kitchen omeprazole (PRILOSEC) 40 MG capsule Take 40 mg by mouth daily.    . predniSONE (DELTASONE) 5 MG tablet Take 5 mg by mouth daily.    Marland Kitchen PROAIR HFA 108 (90 Base) MCG/ACT inhaler Inhale 2 puffs into the lungs every 6 (six) hours as needed for wheezing or shortness of breath.   11  . QUEtiapine (SEROQUEL) 25 MG tablet Take 25 mg by mouth at bedtime.   1   No current facility-administered medications for this visit.    OBJECTIVE: There were no vitals filed for this visit.   There is no height or weight on file to calculate BMI.    ECOG FS:1 - Symptomatic but completely ambulatory  General: Well-developed, well-nourished, no acute distress.  Sitting in a wheelchair. Eyes: Pink conjunctiva, anicteric sclera. HEENT: Normocephalic, moist mucous membranes. Lungs: No audible wheezing or coughing. Heart: Regular rate and rhythm. Abdomen: Soft, nontender, no obvious distention. Musculoskeletal: No edema, cyanosis, or clubbing. Neuro: Alert, answering all questions appropriately. Cranial nerves grossly intact. Skin: No rashes or petechiae noted. Psych: Normal affect.   LAB RESULTS:  Lab Results  Component Value Date   NA 138 04/20/2019   K 4.3 04/20/2019   CL 94 (L) 04/20/2019   CO2 33 (H) 04/20/2019   GLUCOSE 103 (H)  04/20/2019   BUN 9 04/20/2019   CREATININE 0.70 04/20/2019   CALCIUM 9.1 04/20/2019   PROT 7.2 11/09/2016   ALBUMIN 4.1 11/09/2016   AST 27 11/09/2016   ALT 19 11/09/2016   ALKPHOS 18 (L) 11/09/2016   BILITOT 0.5 11/09/2016   GFRNONAA >60 04/20/2019   GFRAA >60 04/20/2019    Lab Results  Component Value Date   WBC 5.3 01/20/2020   NEUTROABS 3.3 01/20/2020   HGB 9.4 (L) 01/20/2020   HCT 30.1 (L) 01/20/2020   MCV 95.6 01/20/2020   PLT 254 01/20/2020   Lab Results  Component Value Date   IRON 67 01/20/2020   TIBC 216 (L) 01/20/2020   IRONPCTSAT 31 01/20/2020   Lab Results  Component Value Date  FERRITIN 300 01/20/2020     STUDIES: No results found.  ASSESSMENT: Iron deficiency anemia.  PLAN:    1.  Iron deficiency anemia: Bone marrow biopsy performed on August 30, 2019 did not report any distinct pathology.  Both cytogenetics and FISH were also reported as normal.  Patient's hemoglobin remains decreased, but significantly improved over previous.  Iron stores are now within normal limits, but despite this we'll proceed with 510 mg IV Feraheme today.  She does not require second infusion.  Return to clinic in 3 months with repeat laboratory work, further evaluation, and continuation of treatment if needed. 2.  Shortness of breath: Continue oxygen as prescribed.  Follow-up with pulmonology as scheduled.  I spent a total of 30 minutes reviewing chart data, face-to-face evaluation with the patient, counseling and coordination of care as detailed above.    Patient expressed understanding and was in agreement with this plan. She also understands that She can call clinic at any time with any questions, concerns, or complaints.     Lloyd Huger, MD   04/22/2020 9:32 AM

## 2020-04-24 ENCOUNTER — Inpatient Hospital Stay: Payer: Medicare Other | Admitting: Oncology

## 2020-04-24 ENCOUNTER — Inpatient Hospital Stay: Payer: Medicare Other

## 2020-05-06 NOTE — Progress Notes (Deleted)
[place]  Telephone:(336) [phone] Fax:(336) [phone]  ID: [place] [name] OB: 1950-07-13  MR#: [id number]  KHV#:[id number]  Patient Care Team: Idelle Crouch, MD as PCP - General (Internal Medicine)   CHIEF COMPLAINT: Iron deficiency anemia.  INTERVAL HISTORY: Patient returns to clinic today for repeat laboratory, further evaluation, and consideration of additional IV Feraheme.  She continues to have chronic weakness and fatigue.  She also has chronic shortness of breath and requires oxygen 24 hours/day.  She otherwise feels well. She has no neurologic complaints.  She denies any recent fevers or illnesses.  She has a good appetite and denies weight loss.  She has no chest pain, cough, or hemoptysis.  She denies any nausea, vomiting, constipation, or diarrhea.  She has no melena or hematochezia.  She has no urinary complaints.  Patient offers no further specific complaints today.  REVIEW OF SYSTEMS:   Review of Systems  Constitutional: Positive for malaise/fatigue. Negative for fever and weight loss.  Respiratory: Positive for shortness of breath. Negative for cough and hemoptysis.   Cardiovascular: Negative.  Negative for chest pain and leg swelling.  Gastrointestinal: Negative.  Negative for abdominal pain, blood in stool and melena.  Genitourinary: Negative.  Negative for hematuria.  Musculoskeletal: Negative.  Negative for back pain.  Skin: Negative.  Negative for rash.  Neurological: Positive for weakness. Negative for dizziness, focal weakness and headaches.  Psychiatric/Behavioral: Negative.  The patient is not nervous/anxious.     As per HPI. Otherwise, a complete review of systems is negative.  PAST MEDICAL HISTORY: Past Medical History:  Diagnosis Date  . Anemia   . Anxiety   . Arthritis   . Atrial fibrillation ([place])   . C. difficile colitis 09/2015  . COPD (chronic obstructive pulmonary disease) ([place])   . Depression   . Dyspnea   .  Dysrhythmia   . Fibromyalgia   . H/O tracheostomy   . Hep C w/ coma, chronic ([place])   . Hypertension   . MRSA pneumonia ([place]) 2017  . On home oxygen therapy    3 L/M   . Osteoporosis   . Peripheral neuropathy   . RLS (restless legs syndrome)   . S/P percutaneous endoscopic gastrostomy (PEG) tube placement ([place]) 09/2015  . Ventilator associated pneumonia [facility]) 10/2015   [facility], [place]    PAST SURGICAL HISTORY: Past Surgical History:  Procedure Laterality Date  . ABDOMINAL HYSTERECTOMY    . BREAST SURGERY Bilateral    Breast Implants  . DILATION AND CURETTAGE OF UTERUS    . REVERSE SHOULDER ARTHROPLASTY Right 03/06/2017   Procedure: REVERSE SHOULDER ARTHROPLASTY;  Surgeon: Corky Mull, MD;  Location: [facility] ORS;  Service: Orthopedics;  Laterality: Right;  . ROTATOR CUFF REPAIR Bilateral     FAMILY HISTORY: Family History  Problem Relation Age of Onset  . Hypertension Mother     ADVANCED DIRECTIVES (Y/N):  N  HEALTH MAINTENANCE: Social History   Tobacco Use  . Smoking status: Former Smoker    Packs/day: 1.50    Types: Cigarettes    Quit date: 10/05/2015    Years since quitting: 4.5  . Smokeless tobacco: Never Used  Vaping Use  . Vaping Use: Some days  . Last attempt to quit: 10/05/2015  Substance Use Topics  . Alcohol use: No  . Drug use: No     Colonoscopy:  PAP:  Bone density:  Lipid panel:  No Known Allergies  Current Outpatient Medications  Medication Sig Dispense Refill  .  albuterol (PROVENTIL) (2.5 MG/3ML) 0.083% nebulizer solution Inhale into the lungs.    Marland Kitchen amitriptyline (ELAVIL) 25 MG tablet Take 25 mg by mouth at bedtime.     Marland Kitchen apixaban (ELIQUIS) 5 MG TABS tablet Take 1 tablet (5 mg total) by mouth 2 (two) times daily. 60 tablet 0  . diazepam (VALIUM) 5 MG tablet Take 5 mg by mouth every 8 (eight) hours as needed for anxiety.   5  . diltiazem (CARDIZEM CD) 120 MG 24 hr capsule Take 120 mg by mouth daily.    Marland Kitchen estradiol (ESTRACE) 1 MG  tablet Take 1 mg by mouth daily.  1  . Fluticasone-Umeclidin-Vilant 100-62.5-25 MCG/INH AEPB Inhale into the lungs.    . furosemide (LASIX) 20 MG tablet Take 20 mg by mouth daily.   1  . gabapentin (NEURONTIN) 100 MG capsule Take 100 mg by mouth 3 (three) times daily.     Marland Kitchen HYDROcodone-acetaminophen (NORCO) 10-325 MG tablet TK 1 T PO Q 6 H PRN P    . lisinopril (ZESTRIL) 10 MG tablet TK 1 T PO ONCE D    . MAGNESIUM-OXIDE 400 (241.3 Mg) MG tablet Take 400 mg by mouth daily.   1  . metoprolol tartrate (LOPRESSOR) 25 MG tablet Take 2 tablets (50 mg total) by mouth 2 (two) times daily. 60 tablet 0  . Multiple Vitamin (MULTIVITAMIN) tablet Take 1 tablet by mouth daily.    Marland Kitchen omeprazole (PRILOSEC) 40 MG capsule Take 40 mg by mouth daily.    . predniSONE (DELTASONE) 5 MG tablet Take 5 mg by mouth daily.    Marland Kitchen PROAIR HFA 108 (90 Base) MCG/ACT inhaler Inhale 2 puffs into the lungs every 6 (six) hours as needed for wheezing or shortness of breath.   11  . QUEtiapine (SEROQUEL) 25 MG tablet Take 25 mg by mouth at bedtime.   1   No current facility-administered medications for this visit.    OBJECTIVE: There were no vitals filed for this visit.   There is no height or weight on file to calculate BMI.    ECOG FS:1 - Symptomatic but completely ambulatory  General: Well-developed, well-nourished, no acute distress.  Sitting in a wheelchair. Eyes: Pink conjunctiva, anicteric sclera. HEENT: Normocephalic, moist mucous membranes. Lungs: No audible wheezing or coughing. Heart: Regular rate and rhythm. Abdomen: Soft, nontender, no obvious distention. Musculoskeletal: No edema, cyanosis, or clubbing. Neuro: Alert, answering all questions appropriately. Cranial nerves grossly intact. Skin: No rashes or petechiae noted. Psych: Normal affect.   LAB RESULTS:  Lab Results  Component Value Date   NA 138 04/20/2019   K 4.3 04/20/2019   CL 94 (L) 04/20/2019   CO2 33 (H) 04/20/2019   GLUCOSE 103 (H)  04/20/2019   BUN 9 04/20/2019   CREATININE 0.70 04/20/2019   CALCIUM 9.1 04/20/2019   PROT 7.2 11/09/2016   ALBUMIN 4.1 11/09/2016   AST 27 11/09/2016   ALT 19 11/09/2016   ALKPHOS 18 (L) 11/09/2016   BILITOT 0.5 11/09/2016   GFRNONAA >60 04/20/2019   GFRAA >60 04/20/2019    Lab Results  Component Value Date   WBC 5.3 01/20/2020   NEUTROABS 3.3 01/20/2020   HGB 9.4 (L) 01/20/2020   HCT 30.1 (L) 01/20/2020   MCV 95.6 01/20/2020   PLT 254 01/20/2020   Lab Results  Component Value Date   IRON 67 01/20/2020   TIBC 216 (L) 01/20/2020   IRONPCTSAT 31 01/20/2020   Lab Results  Component Value Date  FERRITIN 300 01/20/2020     STUDIES: No results found.  ASSESSMENT: Iron deficiency anemia.  PLAN:    1.  Iron deficiency anemia: Bone marrow biopsy performed on August 30, 2019 did not report any distinct pathology.  Both cytogenetics and FISH were also reported as normal.  Patient's hemoglobin remains decreased, but significantly improved over previous.  Iron stores are now within normal limits, but despite this we'll proceed with 510 mg IV Feraheme today.  She does not require second infusion.  Return to clinic in 3 months with repeat laboratory work, further evaluation, and continuation of treatment if needed. 2.  Shortness of breath: Continue oxygen as prescribed.  Follow-up with pulmonology as scheduled.  I spent a total of 30 minutes reviewing chart data, face-to-face evaluation with the patient, counseling and coordination of care as detailed above.    Patient expressed understanding and was in agreement with this plan. She also understands that She can call clinic at any time with any questions, concerns, or complaints.     Lloyd Huger, MD   05/06/2020 12:18 PM

## 2020-05-11 ENCOUNTER — Inpatient Hospital Stay: Payer: Medicare Other

## 2020-05-11 ENCOUNTER — Inpatient Hospital Stay: Payer: Medicare Other | Admitting: Oncology

## 2020-05-11 DIAGNOSIS — D509 Iron deficiency anemia, unspecified: Secondary | ICD-10-CM

## 2020-06-02 ENCOUNTER — Inpatient Hospital Stay
Admission: EM | Admit: 2020-06-02 | Discharge: 2020-06-04 | DRG: 190 | Disposition: A | Discharge status: Home / Self Care | Payer: Medicare Other | Attending: Obstetrics and Gynecology | Admitting: Obstetrics and Gynecology

## 2020-06-02 ENCOUNTER — Emergency Department: Payer: Medicare Other

## 2020-06-02 ENCOUNTER — Other Ambulatory Visit: Payer: Self-pay

## 2020-06-02 DIAGNOSIS — J441 Chronic obstructive pulmonary disease with (acute) exacerbation: Secondary | ICD-10-CM | POA: Diagnosis present

## 2020-06-02 DIAGNOSIS — M797 Fibromyalgia: Secondary | ICD-10-CM | POA: Diagnosis present

## 2020-06-02 DIAGNOSIS — Z86718 Personal history of other venous thrombosis and embolism: Secondary | ICD-10-CM

## 2020-06-02 DIAGNOSIS — D638 Anemia in other chronic diseases classified elsewhere: Secondary | ICD-10-CM | POA: Diagnosis present

## 2020-06-02 DIAGNOSIS — J9621 Acute and chronic respiratory failure with hypoxia: Secondary | ICD-10-CM | POA: Diagnosis present

## 2020-06-02 DIAGNOSIS — Z7952 Long term (current) use of systemic steroids: Secondary | ICD-10-CM

## 2020-06-02 DIAGNOSIS — M810 Age-related osteoporosis without current pathological fracture: Secondary | ICD-10-CM | POA: Diagnosis present

## 2020-06-02 DIAGNOSIS — I480 Paroxysmal atrial fibrillation: Secondary | ICD-10-CM | POA: Diagnosis present

## 2020-06-02 DIAGNOSIS — J9601 Acute respiratory failure with hypoxia: Secondary | ICD-10-CM | POA: Diagnosis not present

## 2020-06-02 DIAGNOSIS — F411 Generalized anxiety disorder: Secondary | ICD-10-CM | POA: Diagnosis present

## 2020-06-02 DIAGNOSIS — Z20822 Contact with and (suspected) exposure to covid-19: Secondary | ICD-10-CM | POA: Diagnosis present

## 2020-06-02 DIAGNOSIS — G2581 Restless legs syndrome: Secondary | ICD-10-CM | POA: Diagnosis present

## 2020-06-02 DIAGNOSIS — I10 Essential (primary) hypertension: Secondary | ICD-10-CM | POA: Diagnosis present

## 2020-06-02 DIAGNOSIS — F32A Depression, unspecified: Secondary | ICD-10-CM | POA: Diagnosis present

## 2020-06-02 DIAGNOSIS — Z86711 Personal history of pulmonary embolism: Secondary | ICD-10-CM | POA: Diagnosis not present

## 2020-06-02 DIAGNOSIS — M1990 Unspecified osteoarthritis, unspecified site: Secondary | ICD-10-CM | POA: Diagnosis present

## 2020-06-02 DIAGNOSIS — Z8249 Family history of ischemic heart disease and other diseases of the circulatory system: Secondary | ICD-10-CM | POA: Diagnosis not present

## 2020-06-02 DIAGNOSIS — I493 Ventricular premature depolarization: Secondary | ICD-10-CM | POA: Diagnosis present

## 2020-06-02 DIAGNOSIS — R000 Tachycardia, unspecified: Secondary | ICD-10-CM | POA: Diagnosis present

## 2020-06-02 DIAGNOSIS — Z7901 Long term (current) use of anticoagulants: Secondary | ICD-10-CM

## 2020-06-02 DIAGNOSIS — Z79899 Other long term (current) drug therapy: Secondary | ICD-10-CM

## 2020-06-02 DIAGNOSIS — F418 Other specified anxiety disorders: Secondary | ICD-10-CM | POA: Diagnosis not present

## 2020-06-02 DIAGNOSIS — R0602 Shortness of breath: Secondary | ICD-10-CM | POA: Diagnosis not present

## 2020-06-02 DIAGNOSIS — Z87891 Personal history of nicotine dependence: Secondary | ICD-10-CM | POA: Diagnosis not present

## 2020-06-02 DIAGNOSIS — G629 Polyneuropathy, unspecified: Secondary | ICD-10-CM | POA: Diagnosis present

## 2020-06-02 DIAGNOSIS — K219 Gastro-esophageal reflux disease without esophagitis: Secondary | ICD-10-CM | POA: Diagnosis present

## 2020-06-02 LAB — BASIC METABOLIC PANEL
Anion gap: 11 (ref 5–15)
BUN: 10 mg/dL (ref 8–23)
CO2: 41 mmol/L — ABNORMAL HIGH (ref 22–32)
Calcium: 9.2 mg/dL (ref 8.9–10.3)
Chloride: 85 mmol/L — ABNORMAL LOW (ref 98–111)
Creatinine, Ser: 0.76 mg/dL (ref 0.44–1.00)
GFR, Estimated: >60 mL/min (ref >60)
Glucose, Bld: 87 mg/dL (ref 70–99)
Potassium: 3.7 mmol/L (ref 3.5–5.1)
Sodium: 137 mmol/L (ref 135–145)

## 2020-06-02 LAB — CBC
HCT: 32.5 % — ABNORMAL LOW (ref 36.0–46.0)
Hemoglobin: 10 g/dL — ABNORMAL LOW (ref 12.0–15.0)
MCH: 31 pg (ref 26.0–34.0)
MCHC: 30.8 g/dL (ref 30.0–36.0)
MCV: 100.6 fL — ABNORMAL HIGH (ref 80.0–100.0)
Platelets: 217 10*3/uL (ref 150–400)
RBC: 3.23 MIL/uL — ABNORMAL LOW (ref 3.87–5.11)
RDW: 13 % (ref 11.5–15.5)
WBC: 6.9 10*3/uL (ref 4.0–10.5)
nRBC: 0 % (ref 0.0–0.2)

## 2020-06-02 LAB — BLOOD GAS, VENOUS
Acid-Base Excess: 17.5 mmol/L — ABNORMAL HIGH (ref 0.0–2.0)
Bicarbonate: 44.5 mmol/L — ABNORMAL HIGH (ref 20.0–28.0)
O2 Saturation: 89.6 %
Patient temperature: 37
pCO2, Ven: 67 mmHg — ABNORMAL HIGH (ref 44.0–60.0)
pH, Ven: 7.43 (ref 7.250–7.430)
pO2, Ven: 56 mmHg — ABNORMAL HIGH (ref 32.0–45.0)

## 2020-06-02 LAB — RESP PANEL BY RT-PCR (FLU A&B, COVID) ARPGX2
Influenza A by PCR: NEGATIVE
Influenza B by PCR: NEGATIVE
SARS Coronavirus 2 by RT PCR: NEGATIVE

## 2020-06-02 LAB — TROPONIN I (HIGH SENSITIVITY): Troponin I (High Sensitivity): 6 ng/L (ref <18)

## 2020-06-02 LAB — MAGNESIUM: Magnesium: 2 mg/dL (ref 1.7–2.4)

## 2020-06-02 MED ORDER — MAGNESIUM SULFATE 2 GM/50ML IV SOLN
INTRAVENOUS | Status: AC
  Administered 2020-06-02: 2 g via INTRAVENOUS
  Filled 2020-06-02: qty 50

## 2020-06-02 MED ORDER — METHYLPREDNISOLONE SODIUM SUCC 40 MG IJ SOLR
40.0000 mg | Freq: Four times a day (QID) | INTRAMUSCULAR | Status: DC
  Administered 2020-06-02 – 2020-06-04 (×7): 40 mg via INTRAVENOUS
  Filled 2020-06-02 (×7): qty 1

## 2020-06-02 MED ORDER — IPRATROPIUM-ALBUTEROL 0.5-2.5 (3) MG/3ML IN SOLN
3.0000 mL | Freq: Once | RESPIRATORY_TRACT | Status: AC
  Administered 2020-06-02: 3 mL via RESPIRATORY_TRACT
  Filled 2020-06-02: qty 3

## 2020-06-02 MED ORDER — SODIUM CHLORIDE 0.9 % IV SOLN
500.0000 mg | INTRAVENOUS | Status: DC
  Administered 2020-06-02: 500 mg via INTRAVENOUS
  Filled 2020-06-02: qty 500

## 2020-06-02 MED ORDER — ACETAMINOPHEN 650 MG RE SUPP: 650.0000 mg | Freq: Four times a day (QID) | RECTAL | Status: DC | PRN

## 2020-06-02 MED ORDER — IPRATROPIUM-ALBUTEROL 0.5-2.5 (3) MG/3ML IN SOLN
3.0000 mL | Freq: Four times a day (QID) | RESPIRATORY_TRACT | Status: DC
  Administered 2020-06-03 – 2020-06-04 (×6): 3 mL via RESPIRATORY_TRACT
  Filled 2020-06-02 (×6): qty 3

## 2020-06-02 MED ORDER — DILTIAZEM HCL ER COATED BEADS 120 MG PO CP24
120.0000 mg | ORAL_CAPSULE | Freq: Every day | ORAL | Status: DC
  Administered 2020-06-03 – 2020-06-04 (×2): 120 mg via ORAL
  Filled 2020-06-02 (×2): qty 1

## 2020-06-02 MED ORDER — MAGNESIUM SULFATE 2 GM/50ML IV SOLN: 2.0000 g | Freq: Once | INTRAVENOUS | Status: AC

## 2020-06-02 MED ORDER — PANTOPRAZOLE SODIUM 40 MG PO TBEC
40.0000 mg | DELAYED_RELEASE_TABLET | Freq: Every day | ORAL | Status: DC
  Administered 2020-06-03 – 2020-06-04 (×2): 40 mg via ORAL
  Filled 2020-06-02 (×2): qty 1

## 2020-06-02 MED ORDER — GABAPENTIN 300 MG PO CAPS
300.0000 mg | ORAL_CAPSULE | Freq: Three times a day (TID) | ORAL | Status: DC
  Administered 2020-06-02 – 2020-06-04 (×6): 300 mg via ORAL
  Filled 2020-06-02 (×6): qty 1

## 2020-06-02 MED ORDER — ACETAMINOPHEN 325 MG PO TABS: 650.0000 mg | ORAL_TABLET | Freq: Four times a day (QID) | ORAL | Status: DC | PRN

## 2020-06-02 MED ORDER — DIAZEPAM 5 MG PO TABS
5.0000 mg | ORAL_TABLET | Freq: Three times a day (TID) | ORAL | Status: DC | PRN
  Administered 2020-06-02 – 2020-06-04 (×3): 5 mg via ORAL
  Filled 2020-06-02 (×3): qty 1

## 2020-06-02 MED ORDER — SODIUM CHLORIDE 0.9 % IV BOLUS
500.0000 mL | Freq: Once | INTRAVENOUS | Status: AC
  Administered 2020-06-02: 500 mL via INTRAVENOUS

## 2020-06-02 MED ORDER — AMITRIPTYLINE HCL 25 MG PO TABS
25.0000 mg | ORAL_TABLET | Freq: Every day | ORAL | Status: DC
  Administered 2020-06-02 – 2020-06-03 (×2): 25 mg via ORAL
  Filled 2020-06-02 (×2): qty 1

## 2020-06-02 MED ORDER — METOPROLOL TARTRATE 50 MG PO TABS
50.0000 mg | ORAL_TABLET | Freq: Two times a day (BID) | ORAL | Status: DC
  Administered 2020-06-02 – 2020-06-04 (×4): 50 mg via ORAL
  Filled 2020-06-02 (×4): qty 1

## 2020-06-02 MED ORDER — LISINOPRIL 10 MG PO TABS
10.0000 mg | ORAL_TABLET | Freq: Every day | ORAL | Status: DC
  Administered 2020-06-03 – 2020-06-04 (×2): 10 mg via ORAL
  Filled 2020-06-02 (×2): qty 1

## 2020-06-02 MED ORDER — APIXABAN 5 MG PO TABS
5.0000 mg | ORAL_TABLET | Freq: Two times a day (BID) | ORAL | Status: DC
  Administered 2020-06-02 – 2020-06-04 (×4): 5 mg via ORAL
  Filled 2020-06-02 (×4): qty 1

## 2020-06-02 MED ORDER — METHYLPREDNISOLONE SODIUM SUCC 125 MG IJ SOLR
125.0000 mg | Freq: Once | INTRAMUSCULAR | Status: AC
  Administered 2020-06-02: 125 mg via INTRAVENOUS
  Filled 2020-06-02: qty 2

## 2020-06-02 MED ORDER — QUETIAPINE FUMARATE 25 MG PO TABS
25.0000 mg | ORAL_TABLET | Freq: Every day | ORAL | Status: DC
  Administered 2020-06-02 – 2020-06-03 (×2): 25 mg via ORAL
  Filled 2020-06-02 (×2): qty 1

## 2020-06-02 MED ORDER — ALBUTEROL SULFATE (2.5 MG/3ML) 0.083% IN NEBU: 2.5000 mg | INHALATION_SOLUTION | RESPIRATORY_TRACT | Status: DC | PRN

## 2020-06-02 MED ORDER — OXYCODONE-ACETAMINOPHEN 5-325 MG PO TABS
1.0000 | ORAL_TABLET | Freq: Once | ORAL | Status: AC
Start: 2020-06-02 — End: 2020-06-02
  Administered 2020-06-02: 1 via ORAL
  Filled 2020-06-02: qty 1

## 2020-06-02 MED ORDER — OXYCODONE-ACETAMINOPHEN 5-325 MG PO TABS
1.0000 | ORAL_TABLET | Freq: Three times a day (TID) | ORAL | Status: DC | PRN
  Administered 2020-06-03 – 2020-06-04 (×2): 1 via ORAL
  Filled 2020-06-02 (×2): qty 1

## 2020-06-02 NOTE — ED Provider Notes (Signed)
[facility] Emergency Department Provider Note ____________________________________________   Event Date/Time   First MD Initiated Contact with Patient 06/02/20 1545     (approximate)  I have reviewed the triage vital signs and the nursing notes.   HISTORY  Chief Complaint Shortness of Breath    HPI Virginia [name] is a 69 y.o. female with PMH as noted below including COPD and A. fib who presents with worsening shortness of breath over the last several days, gradual onset, associated with some pressure-like chest discomfort.  It is worse at night.  Has not been relieved by albuterol and her other medications at home.  She denies any change in her cough from her baseline.  She has had no fever or chills.  Past Medical History:  Diagnosis Date  . Anemia   . Anxiety   . Arthritis   . Atrial fibrillation (HCC)   . C. difficile colitis 09/2015  . COPD (chronic obstructive pulmonary disease) (HCC)   . Depression   . Dyspnea   . Dysrhythmia   . Fibromyalgia   . H/O tracheostomy   . Hep C w/ coma, chronic   . Hypertension   . MRSA pneumonia (HCC) 2017  . On home oxygen therapy    3 L/M   . Osteoporosis   . Peripheral neuropathy   . RLS (restless legs syndrome)   . S/P percutaneous endoscopic gastrostomy (PEG) tube placement (HCC) 09/2015  . Ventilator associated pneumonia [facility]) 10/2015   [facility], [place]    Patient Active Problem List   Diagnosis Date Noted  . Iron deficiency anemia 01/22/2020  . Anemia 01/17/2019  . Coronary artery disease involving native coronary artery of native heart 04/30/2018  . Palpitations 04/30/2018  . Chronic heartburn 04/12/2018  . Other dysphagia 04/12/2018  . Thoracic aortic aneurysm without rupture (HCC) 08/27/2017  . 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 SOBOE (shortness of breath on exertion) 01/24/2017  . Paroxysmal A-fib (HCC) 11/09/2016  . Chronic hypoxemic  respiratory failure (HCC) 12/06/2015  . Depression with anxiety 12/06/2015  . Fibromyalgia 12/06/2015  . Hep C w/o coma, chronic (HCC) 12/06/2015  . History of tracheostomy 12/06/2015  . HTN, goal below 140/80 12/06/2015  . Localized osteoporosis without current pathological fracture 12/06/2015  . PEG (percutaneous endoscopic gastrostomy) status (HCC) 12/06/2015  . Peripheral neuropathy, idiopathic 12/06/2015  . RLS (restless legs syndrome) 12/06/2015  . 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  . COPD (chronic obstructive pulmonary disease) (HCC) 11/29/2015    Past Surgical History:  Procedure Laterality Date  . ABDOMINAL HYSTERECTOMY    . BREAST SURGERY Bilateral    Breast Implants  . DILATION AND CURETTAGE OF UTERUS    . REVERSE SHOULDER ARTHROPLASTY Right 03/06/2017   Procedure: REVERSE SHOULDER ARTHROPLASTY;  Surgeon: Christena Flake, MD;  Location: [facility] ORS;  Service: Orthopedics;  Laterality: Right;  . ROTATOR CUFF REPAIR Bilateral     Prior to Admission medications   Medication Sig Start Date End Date Taking? Authorizing Provider  albuterol (PROVENTIL) (2.5 MG/3ML) 0.083% nebulizer solution Inhale into the lungs. 02/09/19   [provider]  amitriptyline (ELAVIL) 25 MG tablet Take 25 mg by mouth at bedtime.  01/14/19   [provider]  apixaban (ELIQUIS) 5 MG TABS tablet Take 1 tablet (5 mg total) by mouth 2 (two) times daily. 11/10/16   Adrian Saran, MD  diazepam (VALIUM) 5 MG tablet Take 5 mg by mouth every 8 (eight) hours as  needed for anxiety.  10/22/16   [provider]  diltiazem (CARDIZEM CD) 120 MG 24 hr capsule Take 120 mg by mouth daily. 04/29/19   [provider]  estradiol (ESTRACE) 1 MG tablet Take 1 mg by mouth daily. 09/10/16   [provider]  Fluticasone-Umeclidin-Vilant 100-62.5-25 MCG/INH AEPB Inhale into the lungs. 12/01/18   [provider]  furosemide (LASIX) 20 MG tablet Take 20 mg by mouth daily.  10/01/16    [provider]  gabapentin (NEURONTIN) 100 MG capsule Take 100 mg by mouth 3 (three) times daily.  01/06/19   [provider]  HYDROcodone-acetaminophen (NORCO) 10-325 MG tablet TK 1 T PO Q 6 H PRN P 12/23/18   [provider]  lisinopril (ZESTRIL) 10 MG tablet TK 1 T PO ONCE D 12/16/18   [provider]  MAGNESIUM-OXIDE 400 (241.3 Mg) MG tablet Take 400 mg by mouth daily.  08/24/16   [provider]  metoprolol tartrate (LOPRESSOR) 25 MG tablet Take 2 tablets (50 mg total) by mouth 2 (two) times daily. 11/10/16   Adrian Saran, MD  Multiple Vitamin (MULTIVITAMIN) tablet Take 1 tablet by mouth daily.    [provider]  omeprazole (PRILOSEC) 40 MG capsule Take 40 mg by mouth daily. 04/16/19   [provider]  predniSONE (DELTASONE) 5 MG tablet Take 5 mg by mouth daily. 05/23/19   [provider]  PROAIR HFA 108 (90 Base) MCG/ACT inhaler Inhale 2 puffs into the lungs every 6 (six) hours as needed for wheezing or shortness of breath.  10/23/16   [provider]  QUEtiapine (SEROQUEL) 25 MG tablet Take 25 mg by mouth at bedtime.  10/01/16   [provider]    Allergies Patient has no known allergies.  Family History  Problem Relation Age of Onset  . Hypertension Mother     Social History Social History   Tobacco Use  . Smoking status: Former Smoker    Packs/day: 1.50    Types: Cigarettes    Quit date: 10/05/2015    Years since quitting: 4.6  . Smokeless tobacco: Never Used  Vaping Use  . Vaping Use: Some days  . Last attempt to quit: 10/05/2015  Substance Use Topics  . Alcohol use: No  . Drug use: No    Review of Systems  Constitutional: No fever/chills. Eyes: No redness. ENT: No sore throat. Cardiovascular: Positive for chest discomfort. Respiratory: Positive for shortness of breath. Gastrointestinal: No vomiting or diarrhea.  Genitourinary: Negative for dysuria.  Musculoskeletal: Negative for  back pain. Skin: Negative for rash. Neurological: Negative for headache.   ____________________________________________   PHYSICAL EXAM:  VITAL SIGNS: ED Triage Vitals  Enc Vitals Group     BP 06/02/20 1258 106/84     Pulse Rate 06/02/20 1252 (!) 107     Resp 06/02/20 1258 (!) 22     Temp 06/02/20 1258 98.5 F (36.9 C)     Temp Source 06/02/20 1258 Oral     SpO2 06/02/20 1252 100 %     Weight 06/02/20 1255 120 lb (54.4 kg)     Height 06/02/20 1255 5\' 2"  (1.575 m)     Head Circumference --      Peak Flow --      Pain Score 06/02/20 1255 3     Pain Loc --      Pain Edu? --      Excl. in GC? --     Constitutional: Alert and oriented.  Chronically ill-appearing but in no acute distress. Eyes: Conjunctivae are normal.  Head: Atraumatic. Nose: No congestion/rhinnorhea. Mouth/Throat: Mucous membranes are dry.   Neck: Normal range of motion.  Cardiovascular: Normal rate, regular rhythm. Grossly normal heart sounds.  Good peripheral circulation. Respiratory: Increased respiratory effort with accessory muscle use.  Diminished breath sounds and poor air entry bilaterally with scattered wheezing. Gastrointestinal: Soft and nontender. No distention.  Genitourinary: No flank tenderness. Musculoskeletal: No lower extremity edema.  Extremities warm and well perfused.  Neurologic:  Normal speech and language. No gross focal neurologic deficits are appreciated.  Skin:  Skin is warm and dry. No rash noted. Psychiatric: Mood and affect are normal. Speech and behavior are normal.  ____________________________________________   LABS (all labs ordered are listed, but only abnormal results are displayed)  Labs Reviewed  BASIC METABOLIC PANEL - Abnormal; Notable for the following components:      Result Value   Chloride 85 (*)    CO2 41 (*)    All other components within normal limits  CBC - Abnormal; Notable for the following components:   RBC 3.23 (*)    Hemoglobin 10.0 (*)    HCT  32.5 (*)    MCV 100.6 (*)    All other components within normal limits  RESP PANEL BY RT-PCR (FLU A&B, COVID) ARPGX2  TROPONIN I (HIGH SENSITIVITY)   ____________________________________________  EKG  ED ECG REPORT I, Dionne BucySebastian Priscillia Fouch, the attending physician, personally viewed and interpreted this ECG.  Date: 06/02/2020 EKG Time: 1301 Rate: 106 Rhythm: Sinus tachycardia with occasional PVCs QRS Axis: normal Intervals: normal ST/T Wave abnormalities: Nonspecific T wave abnormalities; interpretation limited by poor EKG baseline Narrative Interpretation: Nonspecific abnormalities with no evidence of acute ischemia  ____________________________________________  RADIOLOGY  CXR interpreted by me shows chronic COPD changes with no focal infiltrate or edema  ____________________________________________   PROCEDURES  Procedure(s) performed: No  Procedures  Critical Care performed: Yes  CRITICAL CARE Performed by: Dionne BucySebastian Tayvien Kane   Total critical care time: 35 minutes  Critical care time was exclusive of separately billable procedures and treating other patients.  Critical care was necessary to treat or prevent imminent or life-threatening deterioration.  Critical care was time spent personally by me on the following activities: development of treatment plan with patient and/or surrogate as well as nursing, discussions with consultants, evaluation of patient's response to treatment, examination of patient, obtaining history from patient or surrogate, ordering and performing treatments and interventions, ordering and review of laboratory studies, ordering and review of radiographic studies, pulse oximetry and re-evaluation of patient's condition. ____________________________________________   INITIAL IMPRESSION / ASSESSMENT AND PLAN / ED COURSE  Pertinent labs & imaging results that were available during my care of the patient were reviewed by me and considered in my  medical decision making (see chart for details).  69 year old female with PMH as noted above including COPD and atrial fibrillation on Eliquis presents with worsening shortness of breath over the last several days associated with some chest tightness/pressure.  I have reviewed the past medical records in epic and Care Everywhere.  The patient most recently presented to the ED for shortness of breath in October of last year but did not require admission at that time.  She was last admitted in 2018 for shoulder surgery.  She was treated as an outpatient for a COPD exacerbation late last month.  On exam currently, the patient is somewhat weak and uncomfortable appearing but in no acute distress.  She has increased  work of breathing and accessory muscle use.  O2 saturation is 100% on her normal 3 to 4 L.  She has diminished breath sounds bilaterally with some scattered wheezes.  EKG is in sinus rhythm with no significant ischemic changes.  Overall presentation is most consistent with COPD exacerbation.  Differential also includes bacterial pneumonia, COVID-19, flu, acute bronchitis, or less likely new onset CHF or other cardiac cause.  We will obtain chest x-ray, lab work-up, give bronchodilators and steroid and reassess.  ----------------------------------------- 6:27 PM on 06/02/2020 -----------------------------------------  Chest x-ray shows no focal infiltrates or edema.  Covid is negative.  Lab work-up is reassuring.  Overall presentation is consistent with COPD exacerbation.  The patient actually initially somewhat worsened after duo nebs and steroid, with better air movement on auscultation but increased work of breathing and mild respiratory distress.  We started the patient on BiPAP.  She now looks much more comfortable and is able to speak in full sentences.  Oxygenation remains in the high 90s.  I proceeded with admission and discussed the case with Dr. Dalene Carrow from the hospitalist  service.  ___________________________  Craig Staggers was evaluated in Emergency Department on 06/02/2020 for the symptoms described in the history of present illness. She was evaluated in the context of the global COVID-19 pandemic, which necessitated consideration that the patient might be at risk for infection with the SARS-CoV-2 virus that causes COVID-19. Institutional protocols and algorithms that pertain to the evaluation of patients at risk for COVID-19 are in a state of rapid change based on information released by regulatory bodies including the CDC and federal and state organizations. These policies and algorithms were followed during the patient's care in the ED. ____________________________________________   FINAL CLINICAL IMPRESSION(S) / ED DIAGNOSES  Final diagnoses:  COPD exacerbation (HCC)  Acute on chronic respiratory failure with hypoxia (HCC)      NEW MEDICATIONS STARTED DURING THIS VISIT:  New Prescriptions   No medications on file     Note:  This document was prepared using Dragon voice recognition software and may include unintentional dictation errors.    Dionne Bucy, MD 06/02/20 [phone]

## 2020-06-02 NOTE — ED Notes (Signed)
This RN entered room to stop breathing TX, pt presented with worsening of SOB and work of breathing and pt states she feels like she "cant breath", MD aware, new orders for 2mg  of mag and BiPAP, RT contacted for BiPAP.

## 2020-06-02 NOTE — ED Notes (Signed)
Pt presents to ED with c/o of SOB that started last night. Pt states she woke up at 0300 and felt like she couldn't breathe. Pt wears oxygen chronically at 3L/min. Pt does have difficulty speaking in full sentences without being dyspneic. Pt has ins and exp wheezes throughout. Pt presents in mild distress. Pt states a HX of COPD. Pt is A&Ox4.

## 2020-06-02 NOTE — ED Triage Notes (Addendum)
Pt to ED EMS for shob that started this morning, wears 3-4L [place] at home.  States has "pressure on lungs".  Speaking in complete sentences.  100% on 4L [place] Denies cough or fevers Expiratory wheezes noted

## 2020-06-02 NOTE — ED Notes (Signed)
Medication Reconciliation Report  For Home History Technicians  HIGHLIGHTS:  1. The patient WAS personally interviewed 2. If not, what was the main source used: PHARMACY RECORDS 3. Does the patient appear to take any anti-coagulation agents (e.g. warfarin, Eliquis or Xarelto): YES 4. Does the patient appear to take any anti-convulsant agents (e.g. divalproex, levetiracetam or phenytoin): NO 5. Does the patient appear to use any insulin products (e.g. Lantus, Novolin or Humalog): NO 6. Does the patient appear to take any "beta-blockers" (e.g. metoprolol, carvedilol or bisoprolol: YES  BARRIERS:  1. Were there any barriers that prevented or complicated the medication reconciliation process: YES 2. If yes, what was the primary barrier encountered: Condition prevents interview 3. Does the patient appear compliant with prescribed medications: YES 4. Does the patient express any barriers with compliance: UNABLE TO DETERMINE 5. What is the primary barrier the patient reports: None   NOTES:[Include any concerns, remarks or complaints the patient expresses regarding medication therapy. Any observations or other information that might be useful to the treatment team can also be included. Immediate needs or concerns should be referred to the RN or appropriate member of the treatment team.]  The patient was interviewed but was unable to communicate clearly due to BiPAP use. Patient was able to confirm taking APIXIBAN and some other medications, but the bulk of the history comes from recent pharmacy dispense records.   Virginia [name], CPhT [place] [facility] [address] [place], [place] [address] [phone]  ** The above is intended solely for informational and/or communicative purposes. It should in no way be considered an endorsement of any specific treatment, therapy or action. **

## 2020-06-02 NOTE — H&P (Signed)
History and Physical    PLEASE NOTE THAT DRAGON DICTATION SOFTWARE WAS USED IN THE CONSTRUCTION OF THIS NOTE.   Virginia [name]:[id number] DOB: [birth year] DOA: 06/02/2020  PCP: Marguarite Arbour, MD Patient coming from: home   I have personally briefly reviewed patient's old medical records in [facility]  Chief Complaint: Shortness of breath  HPI: Virginia [name] is a 69 y.o. female with medical history significant for chronic hypoxic respiratory failure on 4 L continuous elemental oxygen at home, COPD, paroxysmal atrial fibrillation chronically anticoagulated on Eliquis, generalized anxiety disorder, who is admitted to [facility] on 06/02/2020 with acute COPD exacerbation after presenting from home to [facility] Emergency Department complaining of shortness of breath.   The patient reports 4 to 5 days of progressive shortness of breath associated with new onset nonproductive cough.  She denies any associated orthopnea, PND, or worsening of peripheral edema.  Denies any lower extremity erythema, calf tenderness, denies any associated chest pain, diaphoresis, palpitations vomiting.  No recent trauma or travel.  No recent interventions or periods of prolonged diminished ambulatory activity.  Personal history of DVT/PE.  Denies any recent subjective fever, chills, rigors, or generalized myalgias.  Denies any associated neck stiffness, rhinitis, rhinorrhea, sore throat, abdominal pain, diarrhea, or rash.  Denies any known recent COVID-19 exposures.  She confirms that she is a former smoker, having smoked approximately 1 to 1.5 packs/day for at least 20 to 30 years, before completely quitting smoking in 2017 without subsequent resumption.    ED Course:  Vital signs in the ED were notable for the following: Temperature max 98.5, heart rate 96-1 12, blood pressure 106/84-132/96, initial respiratory rate in the low 30s, which is improved in the low 20s following  initiation of BiPAP for comfort; oxygen saturations remained in the range of 96 to 99% throughout this emergency department course, including initial oxygen saturations in this range on chronic 3 to 4 L continuous supplemental O2, with preservation of the strength: 3 to 4 L via the BiPAP.  Labs were notable for the following: BMP was notable for the following: Sodium 137, potassium 3.7, bicarbonate 41, creatinine 0.76.  Ice sensitivity troponin I x1 found to be nonelevated at 6.  CBC notable for white blood cell count of 7000 and hemoglobin 10.  Nasopharyngeal COVID-19/influenza PCR was checked in the emergency department today and found to be negative.  Chest x-ray, 2 view, showed no evidence of acute cardiopulmonary process, including no evidence of infiltrate, edema, effusion, or pneumothorax.  EKG showed sinus tachycardia with occasional PVC and heart rate 106 nonspecific T wave version in leads V1 and aVL, but no evidence of ST changes, including no evidence of ST elevation.  While in the ED, the following were administered: Cymetra 125 mg IV x1, duo nebulizer treatment x1, magnesium sulfate 2 g IV over 2 hours x 1, and a 500 cc normal saline bolus.    Review of Systems: As per HPI otherwise 10 point review of systems negative.   Past Medical History:  Diagnosis Date  . Anemia   . Anxiety   . Arthritis   . Atrial fibrillation (HCC)   . C. difficile colitis 09/2015  . COPD (chronic obstructive pulmonary disease) (HCC)   . Depression   . Dyspnea   . Dysrhythmia   . Fibromyalgia   . H/O tracheostomy   . Hep C w/ coma, chronic   . Hypertension   . MRSA pneumonia (HCC) 2017  .  On home oxygen therapy    3 L/M   . Osteoporosis   . Peripheral neuropathy   . RLS (restless legs syndrome)   . S/P percutaneous endoscopic gastrostomy (PEG) tube placement (HCC) 09/2015  . Ventilator associated pneumonia [facility]) 10/2015   [facility], [place]    Past Surgical History:  Procedure  Laterality Date  . ABDOMINAL HYSTERECTOMY    . BREAST SURGERY Bilateral    Breast Implants  . DILATION AND CURETTAGE OF UTERUS    . REVERSE SHOULDER ARTHROPLASTY Right 03/06/2017   Procedure: REVERSE SHOULDER ARTHROPLASTY;  Surgeon: Christena Flake, MD;  Location: [facility] ORS;  Service: Orthopedics;  Laterality: Right;  . ROTATOR CUFF REPAIR Bilateral     Social History:  reports that she quit smoking about 4 years ago. Her smoking use included cigarettes. She smoked 1.50 packs per day. She has never used smokeless tobacco. She reports that she does not drink alcohol and does not use drugs.   No Known Allergies  Family History  Problem Relation Age of Onset  . Hypertension Mother      Prior to Admission medications   Medication Sig Start Date End Date Taking? Authorizing Provider  albuterol (PROVENTIL) (2.5 MG/3ML) 0.083% nebulizer solution Inhale into the lungs. 02/09/19   [provider]  amitriptyline (ELAVIL) 25 MG tablet Take 25 mg by mouth at bedtime.  01/14/19   [provider]  apixaban (ELIQUIS) 5 MG TABS tablet Take 1 tablet (5 mg total) by mouth 2 (two) times daily. 11/10/16   Adrian Saran, MD  diazepam (VALIUM) 5 MG tablet Take 5 mg by mouth every 8 (eight) hours as needed for anxiety.  10/22/16   [provider]  diltiazem (CARDIZEM CD) 120 MG 24 hr capsule Take 120 mg by mouth daily. 04/29/19   [provider]  estradiol (ESTRACE) 1 MG tablet Take 1 mg by mouth daily. 09/10/16   [provider]  Fluticasone-Umeclidin-Vilant 100-62.5-25 MCG/INH AEPB Inhale into the lungs. 12/01/18   [provider]  furosemide (LASIX) 20 MG tablet Take 20 mg by mouth daily.  10/01/16   [provider]  gabapentin (NEURONTIN) 100 MG capsule Take 100 mg by mouth 3 (three) times daily.  01/06/19   [provider]  HYDROcodone-acetaminophen (NORCO) 10-325 MG tablet TK 1 T PO Q 6 H PRN P 12/23/18   [provider]  lisinopril  (ZESTRIL) 10 MG tablet TK 1 T PO ONCE D 12/16/18   [provider]  MAGNESIUM-OXIDE 400 (241.3 Mg) MG tablet Take 400 mg by mouth daily.  08/24/16   [provider]  metoprolol tartrate (LOPRESSOR) 25 MG tablet Take 2 tablets (50 mg total) by mouth 2 (two) times daily. 11/10/16   Adrian Saran, MD  Multiple Vitamin (MULTIVITAMIN) tablet Take 1 tablet by mouth daily.    [provider]  omeprazole (PRILOSEC) 40 MG capsule Take 40 mg by mouth daily. 04/16/19   [provider]  predniSONE (DELTASONE) 5 MG tablet Take 5 mg by mouth daily. 05/23/19   [provider]  PROAIR HFA 108 (90 Base) MCG/ACT inhaler Inhale 2 puffs into the lungs every 6 (six) hours as needed for wheezing or shortness of breath.  10/23/16   [provider]  QUEtiapine (SEROQUEL) 25 MG tablet Take 25 mg by mouth at bedtime.  10/01/16   [provider]     Objective    Physical Exam: Vitals:   06/02/20 1258 06/02/20 1700 06/02/20 1709 06/02/20 1717  BP: 106/84 (!) 129/95    Pulse:  (!) 103  96  Resp: (!) 22 (!) 22    Temp: 98.5 F (36.9 C)     TempSrc: Oral     SpO2:  96% 100% 100%  Weight:      Height:        General: appears to be stated age; alert, oriented; slight increased wob noted Skin: warm, dry, no rash Head:  AT/[place] Mouth:  Oral mucosa membranes appear dry, normal dentition Neck: supple; trachea midline Heart: Mildly tachycardic, but regular; did not appreciate any M/R/G Lungs: CTAB, did not appreciate any wheezes, rales, or rhonchi Abdomen: + BS; soft, ND, NT Vascular: 2+ pedal pulses b/l; 2+ radial pulses b/l Extremities: no peripheral edema, no muscle wasting Neuro: strength and sensation intact in upper and lower extremities b/l  Labs on Admission: I have personally reviewed following labs and imaging studies  CBC: Recent Labs  Lab 06/02/20 1258  WBC 6.9  HGB 10.0*  HCT 32.5*  MCV 100.6*  PLT 217   Basic Metabolic Panel: Recent  Labs  Lab 06/02/20 1258  NA 137  K 3.7  CL 85*  CO2 41*  GLUCOSE 87  BUN 10  CREATININE 0.76  CALCIUM 9.2   GFR: Estimated Creatinine Clearance: 52.5 mL/min (by C-G formula based on SCr of 0.76 mg/dL). Liver Function Tests: No results for input(s): AST, ALT, ALKPHOS, BILITOT, PROT, ALBUMIN in the last 168 hours. No results for input(s): LIPASE, AMYLASE in the last 168 hours. No results for input(s): AMMONIA in the last 168 hours. Coagulation Profile: No results for input(s): INR, PROTIME in the last 168 hours. Cardiac Enzymes: No results for input(s): CKTOTAL, CKMB, CKMBINDEX, TROPONINI in the last 168 hours. BNP (last 3 results) No results for input(s): PROBNP in the last 8760 hours. HbA1C: No results for input(s): HGBA1C in the last 72 hours. CBG: No results for input(s): GLUCAP in the last 168 hours. Lipid Profile: No results for input(s): CHOL, HDL, LDLCALC, TRIG, CHOLHDL, LDLDIRECT in the last 72 hours. Thyroid Function Tests: No results for input(s): TSH, T4TOTAL, FREET4, T3FREE, THYROIDAB in the last 72 hours. Anemia Panel: No results for input(s): VITAMINB12, FOLATE, FERRITIN, TIBC, IRON, RETICCTPCT in the last 72 hours. Urine analysis:    Component Value Date/Time   COLORURINE STRAW (A) 02/20/2017 1116   APPEARANCEUR CLEAR (A) 02/20/2017 1116   LABSPEC 1.004 (L) 02/20/2017 1116   PHURINE 7.0 02/20/2017 1116   GLUCOSEU NEGATIVE 02/20/2017 1116   HGBUR NEGATIVE 02/20/2017 1116   BILIRUBINUR NEGATIVE 02/20/2017 1116   KETONESUR NEGATIVE 02/20/2017 1116   PROTEINUR NEGATIVE 02/20/2017 1116   NITRITE NEGATIVE 02/20/2017 1116   LEUKOCYTESUR NEGATIVE 02/20/2017 1116    Radiological Exams on Admission: DG Chest 2 View  Result Date: 06/02/2020 CLINICAL DATA:  Shortness of breath since this morning, expiratory wheezing, COPD, atrial fibrillation, hypertension EXAM: CHEST - 2 VIEW COMPARISON:  04/20/2019 Correlation: CT chest 07/30/2017 FINDINGS: Normal heart size,  mediastinal contours, and pulmonary vascularity. Emphysematous and bronchitic changes consistent with COPD. Chronic interstitial prominence in the mid to lower lungs unchanged. No acute infiltrate, pleural effusion, or pneumothorax. RIGHT shoulder prosthesis. Diffuse osseous demineralization with thoracic kyphosis and mild anterior compression deformities of adjacent upper thoracic vertebra unchanged. IMPRESSION: COPD changes with chronic interstitial prominence at bases. No acute abnormalities. Electronically Signed   By: Ulyses Southward M.D.   On: 06/02/2020 13:28     EKG: Independently reviewed, with result as described above.  Assessment/Plan   Virginia [name] is a 69 y.o. female with medical history significant for chronic hypoxic respiratory failure on 4 L continuous elemental oxygen at home, COPD, paroxysmal atrial fibrillation chronically anticoagulated on Eliquis, generalized anxiety disorder, who is admitted to [facility] on 06/02/2020 with acute COPD exacerbation after presenting from home to [facility] Emergency Department complaining of shortness of breath.    Principal Problem:   COPD with acute exacerbation (HCC) Active Problems:   Paroxysmal A-fib (HCC)   Depression with anxiety   Acute respiratory failure with hypoxia (HCC)   Shortness of breath   GERD (gastroesophageal reflux disease)    #) Acute COPD exacerbation: In the context of a documented history of chronic hypoxic respiratory failure on 4 L in the setting of severe COPD, diagnosis of acute exacerbation on the basis of 4 to 5 days of progressive shortness of breath new onset nonproductive cough with evidence of increased work of breathing, increase in serum bicarbonate, with chest x-ray demonstrated no evidence of acute cardiopulmonary process.  Unclear etiology leading to patient's exacerbation, she reports good compliance with her home respiratory regimen.  She has appeared much more comfortable from  a respiratory standpoint when she was initiated on BiPAP.  As she has not been on BiPAP for a few hours, will attempt a trial off of such following procedure blood gas to evaluate for any evidence of uncompensated hypercapnia.  Patient the patient be able to subsequently wean off of the BiPAP, will then admit to the PCU versus to the stepdown unit if there is persistence of need for BiPAP.  Plan: Monitor continuous pulse oximetry.  Monitor on telemetry.  Add on serum magnesium level.  Repeat BMP in the morning as well as serum phosphorus level.  Ordered duo nebulizer treatments every 6 hours.  As needed albuterol nebulizer treatments.  Solu-Medrol 40 mg IV every 6 hours.  ACS normal for benefit of reduction and duration of hospitalization in the setting of an acute COPD exacerbation, as well anti-inflammatory characteristics possessed by a azithromycin.  Repeat CBC with differential tomorrow in the morning.     #) Paroxysmal atrial fibrillation: Documented history of such. In the setting of a CHA2DS2-VASc score of 4, there is an indication for the patient to be on chronic anticoagulation for thromboembolic prophylaxis. Consistent with this, the patient is chronically anticoagulated on Eliquis. Home AV nodal blocking regimen: Lopressor and Cardizem.  Most recent echocardiogram appears to have occurred in May 2018 at which time there was a normal-appearing left ventricle, with LVEF 50 to 55% in the absence of any diastolic function. Presenting EKG shows sinus tachycardia with occasional PVC and no evidence of acute ischemic changes.   Plan: monitor strict I's & O's and daily weights. Repeat BMP in the morning. Check serum magnesium level. Continue home AV nodal blocking regimen, as above.  Continue active anticoagulation at home Eliquis.  Monitor on telemetry    #) Generalized anxiety disorder:.  On as needed Valium at home.  She confirms that she is not currently on any scheduled SSRI or SNRI.  Plan:  Continue home.  Consideration of an outpatient going towards SSRI or SNRI as a mainstay of therapy for generalized anxiety disorder     #) GERD: On omeprazole as an outpatient. -Continue home PPI.     DVT prophylaxis: Home Eliquis Code Status: Full code Family Communication: none Disposition Plan: Per Rounding Team Consults called: none  Admission status: Inpatient   Of note, this  patient was added by me to the following Admit List/Treatment Team:  armcadmits     PLEASE NOTE THAT DRAGON DICTATION SOFTWARE WAS USED IN THE CONSTRUCTION OF THIS NOTE.   Angie FavaJustin B Neithan Day DO [facility] Pager [phone] From 12PM- 12AM  Otherwise, please contact night-coverage  [email or web address] Password [email or web address]  06/02/2020, 6:11 PM

## 2020-06-03 DIAGNOSIS — J441 Chronic obstructive pulmonary disease with (acute) exacerbation: Principal | ICD-10-CM

## 2020-06-03 LAB — CBC WITH DIFFERENTIAL/PLATELET
Abs Immature Granulocytes: 0.02 10*3/uL (ref 0.00–0.07)
Basophils Absolute: 0 10*3/uL (ref 0.0–0.1)
Basophils Relative: 0 %
Eosinophils Absolute: 0 10*3/uL (ref 0.0–0.5)
Eosinophils Relative: 0 %
HCT: 30.5 % — ABNORMAL LOW (ref 36.0–46.0)
Hemoglobin: 9.6 g/dL — ABNORMAL LOW (ref 12.0–15.0)
Immature Granulocytes: 0 %
Lymphocytes Relative: 5 %
Lymphs Abs: 0.2 10*3/uL — ABNORMAL LOW (ref 0.7–4.0)
MCH: 31 pg (ref 26.0–34.0)
MCHC: 31.5 g/dL (ref 30.0–36.0)
MCV: 98.4 fL (ref 80.0–100.0)
Monocytes Absolute: 0.1 10*3/uL (ref 0.1–1.0)
Monocytes Relative: 2 %
Neutro Abs: 4.2 10*3/uL (ref 1.7–7.7)
Neutrophils Relative %: 93 %
Platelets: 213 10*3/uL (ref 150–400)
RBC: 3.1 MIL/uL — ABNORMAL LOW (ref 3.87–5.11)
RDW: 13.2 % (ref 11.5–15.5)
WBC: 4.5 10*3/uL (ref 4.0–10.5)
nRBC: 0 % (ref 0.0–0.2)

## 2020-06-03 LAB — COMPREHENSIVE METABOLIC PANEL
ALT: 21 U/L (ref 0–44)
AST: 26 U/L (ref 15–41)
Albumin: 3.4 g/dL — ABNORMAL LOW (ref 3.5–5.0)
Alkaline Phosphatase: 14 U/L — ABNORMAL LOW (ref 38–126)
Anion gap: 11 (ref 5–15)
BUN: 14 mg/dL (ref 8–23)
CO2: 37 mmol/L — ABNORMAL HIGH (ref 22–32)
Calcium: 8.8 mg/dL — ABNORMAL LOW (ref 8.9–10.3)
Chloride: 87 mmol/L — ABNORMAL LOW (ref 98–111)
Creatinine, Ser: 0.75 mg/dL (ref 0.44–1.00)
GFR, Estimated: >60 mL/min (ref >60)
Glucose, Bld: 121 mg/dL — ABNORMAL HIGH (ref 70–99)
Potassium: 4.6 mmol/L (ref 3.5–5.1)
Sodium: 135 mmol/L (ref 135–145)
Total Bilirubin: 0.7 mg/dL (ref 0.3–1.2)
Total Protein: 6.3 g/dL — ABNORMAL LOW (ref 6.5–8.1)

## 2020-06-03 LAB — MAGNESIUM: Magnesium: 2.4 mg/dL (ref 1.7–2.4)

## 2020-06-03 LAB — BLOOD GAS, VENOUS
Acid-Base Excess: 16.6 mmol/L — ABNORMAL HIGH (ref 0.0–2.0)
Bicarbonate: 43.1 mmol/L — ABNORMAL HIGH (ref 20.0–28.0)
O2 Saturation: 61.1 %
Patient temperature: 37
pCO2, Ven: 62 mmHg — ABNORMAL HIGH (ref 44.0–60.0)
pH, Ven: 7.45 — ABNORMAL HIGH (ref 7.250–7.430)
pO2, Ven: <31 mmHg — CL (ref 32.0–45.0)

## 2020-06-03 LAB — HIV ANTIBODY (ROUTINE TESTING W REFLEX): HIV Screen 4th Generation wRfx: NONREACTIVE

## 2020-06-03 LAB — PHOSPHORUS: Phosphorus: 3 mg/dL (ref 2.5–4.6)

## 2020-06-03 MED ORDER — LEVOFLOXACIN IN D5W 500 MG/100ML IV SOLN
500.0000 mg | INTRAVENOUS | Status: DC
  Administered 2020-06-03: 500 mg via INTRAVENOUS
  Filled 2020-06-03 (×2): qty 100

## 2020-06-03 MED ORDER — SODIUM CHLORIDE 0.9 % IV SOLN
INTRAVENOUS | Status: DC | PRN
  Administered 2020-06-03: 250 mL via INTRAVENOUS

## 2020-06-03 MED ORDER — FUROSEMIDE 20 MG PO TABS
20.0000 mg | ORAL_TABLET | Freq: Every day | ORAL | Status: DC
  Administered 2020-06-03 – 2020-06-04 (×2): 20 mg via ORAL
  Filled 2020-06-03 (×2): qty 1

## 2020-06-03 MED ORDER — OXYCODONE HCL 5 MG PO TABS: 10.0000 mg | ORAL_TABLET | Freq: Four times a day (QID) | ORAL | Status: DC | PRN

## 2020-06-03 NOTE — ED Notes (Signed)
Pt cleaned and new purewick placed by this RN and Youth worker.

## 2020-06-03 NOTE — ED Notes (Signed)
Pt called out reporting she felt as thought the BiPAP was no longer working appropriately. RN assessed machine, confirmed it was working appropriately and reassured pt. Pts oxygen saturation remains stable. Pt able to lay back down again and rest.

## 2020-06-03 NOTE — Progress Notes (Signed)
Pharmacy Antibiotic Note  Virginia [name] is a 69 y.o. female admitted on 06/02/2020 with COPD exacerbation.  Pharmacy has been consulted for Levaquin dosing. -pt was on doxycyline per 05/11/20 office visit note. Hx severe COPD Patient ordered Azithromycin currently- messaged with MD: MD would like to d/c Azithromycin and continue with Levaquin   Plan: Will order Levaquin 500 mg IV q24h   Height: 5\' 2"  (157.5 cm) Weight: 54.4 kg (120 lb) IBW/kg (Calculated) : 50.1  Temp (24hrs), Avg:98.5 F (36.9 C), Min:98.5 F (36.9 C), Max:98.5 F (36.9 C)  Recent Labs  Lab 06/02/20 1258 06/03/20 0434  WBC 6.9 4.5  CREATININE 0.76 0.75    Estimated Creatinine Clearance: 52.5 mL/min (by C-G formula based on SCr of 0.75 mg/dL).    No Known Allergies  Antimicrobials this admission: Azith 12/11 x1 Levaquin 12/11 >>    Dose adjustments this admission:    Microbiology results:   BCx:     UCx:      Sputum:      MRSA PCR:    Thank you for allowing pharmacy to be a part of this patient's care.  Aysel Gilchrest A 06/03/2020 9:19 AM

## 2020-06-03 NOTE — ED Notes (Signed)
Patient placed on bedpan.

## 2020-06-03 NOTE — Progress Notes (Signed)
PROGRESS NOTE    [name]  IBB:[id number] DOB: 1951/01/22 DOA: 06/02/2020 PCP: Marguarite Arbour, MD  Outpatient Specialists: [facility]    Brief Narrative:    Virginia [name] is a 69 y.o. female with medical history significant for chronic hypoxic respiratory failure on 4 L continuous elemental oxygen at home, COPD, paroxysmal atrial fibrillation chronically anticoagulated on Eliquis, generalized anxiety disorder, who is admitted to [facility] on 06/02/2020 with acute COPD exacerbation after presenting from home to [facility] Emergency Department complaining of shortness of breath.   The patient reports 4 to 5 days of progressive shortness of breath associated with new onset nonproductive cough.  She denies any associated orthopnea, PND, or worsening of peripheral edema.  Denies any lower extremity erythema, calf tenderness, denies any associated chest pain, diaphoresis, palpitations vomiting.  No recent trauma or travel.  No recent interventions or periods of prolonged diminished ambulatory activity.  Personal history of DVT/PE.  Denies any recent subjective fever, chills, rigors, or generalized myalgias.  Denies any associated neck stiffness, rhinitis, rhinorrhea, sore throat, abdominal pain, diarrhea, or rash.  Denies any known recent COVID-19 exposures.  She confirms that she is a former smoker, having smoked approximately 1 to 1.5 packs/day for at least 20 to 30 years, before completely quitting smoking in 2017 without subsequent resumption.   Assessment & Plan:   Principal Problem:   COPD with acute exacerbation (HCC) Active Problems:   Paroxysmal A-fib (HCC)   Depression with anxiety   Acute respiratory failure with hypoxia (HCC)   Shortness of breath   GERD (gastroesophageal reflux disease)  # Acute COPD exacerbation # Acute on chronic hypoxic respiratory failure Baseline severe COPD. COPD without acute findings. Here requiring bipap,  now breathing much more comfortably on bipap. No signs sig fluid overload. Troponins negative. On 3-4 L @ home. - repeat vbg, will see about having respiratory trial weaning off bipap - cont solumedrol for now, plan to eventually wean to oral prednisone. Hold home pred 5 qd - start levaquin - IS and flutter valve when off bipap - duonebs q6, albuterol prn  # Paroxysmal atrial fibrillation Here RR, sinus. - cont home metoprolol, diltiazem, apixaban, lasix  # Generalized anxiety disorder Cont home valium, amitryptyline, seroquel  # Menopause vasomotor symptoms - cont home estradiol  # Anemia of chronic disease H stable @ 9.6  # Chronic pain - cont home gabapentin, oxy 10 q6 prn  DVT prophylaxis: eliquis Code Status: full Family Communication: none @ bedside  Status is: Inpatient  Remains inpatient appropriate because:Inpatient level of care appropriate due to severity of illness   Dispo: The patient is from: Home              Anticipated d/c is to: Home, will need pt/ot consult              Anticipated d/c date is: 2-4 days              Patient currently is not medically stable to d/c.        Consultants:  none  Procedures: none  Antimicrobials:  Levofloxacin 12/11>    Subjective: This morning says breathing improved on bipap, less cough and sob. No abd pain or n/v, no fevers.  Objective: Vitals:   06/03/20 0600 06/03/20 0700 06/03/20 0730 06/03/20 0800  BP: 116/83 116/85 125/82 122/81  Pulse: 81 85 79 82  Resp: (!) 23 19 18 16   Temp:      TempSrc:  SpO2: 97% 95% 96% 99%  Weight:      Height:        Intake/Output Summary (Last 24 hours) at 06/03/2020 0839 Last data filed at 06/03/2020 0355 Gross per 24 hour  Intake 264.18 ml  Output --  Net 264.18 ml   Filed Weights   06/02/20 1255  Weight: 54.4 kg    Examination:  General exam: Appears calm and comfortable  Respiratory system: exp wheeze Cardiovascular system: S1 & S2 heard,  RRR. No pedal edema Gastrointestinal system: Abdomen is nondistended, soft and nontender. No organomegaly or masses felt. Normal bowel sounds heard. Central nervous system: Alert and oriented. No focal neurological deficits. Extremities: Symmetric 5 x 5 power. Skin: No rashes, lesions or ulcers Psychiatry: Judgement and insight appear normal. Mood & affect appropriate.     Data Reviewed: I have personally reviewed following labs and imaging studies  CBC: Recent Labs  Lab 06/02/20 1258 06/03/20 0434  WBC 6.9 4.5  NEUTROABS  --  4.2  HGB 10.0* 9.6*  HCT 32.5* 30.5*  MCV 100.6* 98.4  PLT 217 213   Basic Metabolic Panel: Recent Labs  Lab 06/02/20 1258 06/03/20 0434  NA 137 135  K 3.7 4.6  CL 85* 87*  CO2 41* 37*  GLUCOSE 87 121*  BUN 10 14  CREATININE 0.76 0.75  CALCIUM 9.2 8.8*  MG 2.0 2.4  PHOS  --  3.0   GFR: Estimated Creatinine Clearance: 52.5 mL/min (by C-G formula based on SCr of 0.75 mg/dL). Liver Function Tests: Recent Labs  Lab 06/03/20 0434  AST 26  ALT 21  ALKPHOS 14*  BILITOT 0.7  PROT 6.3*  ALBUMIN 3.4*   No results for input(s): LIPASE, AMYLASE in the last 168 hours. No results for input(s): AMMONIA in the last 168 hours. Coagulation Profile: No results for input(s): INR, PROTIME in the last 168 hours. Cardiac Enzymes: No results for input(s): CKTOTAL, CKMB, CKMBINDEX, TROPONINI in the last 168 hours. BNP (last 3 results) No results for input(s): PROBNP in the last 8760 hours. HbA1C: No results for input(s): HGBA1C in the last 72 hours. CBG: No results for input(s): GLUCAP in the last 168 hours. Lipid Profile: No results for input(s): CHOL, HDL, LDLCALC, TRIG, CHOLHDL, LDLDIRECT in the last 72 hours. Thyroid Function Tests: No results for input(s): TSH, T4TOTAL, FREET4, T3FREE, THYROIDAB in the last 72 hours. Anemia Panel: No results for input(s): VITAMINB12, FOLATE, FERRITIN, TIBC, IRON, RETICCTPCT in the last 72 hours. Urine  analysis:    Component Value Date/Time   COLORURINE STRAW (A) 02/20/2017 1116   APPEARANCEUR CLEAR (A) 02/20/2017 1116   LABSPEC 1.004 (L) 02/20/2017 1116   PHURINE 7.0 02/20/2017 1116   GLUCOSEU NEGATIVE 02/20/2017 1116   HGBUR NEGATIVE 02/20/2017 1116   BILIRUBINUR NEGATIVE 02/20/2017 1116   KETONESUR NEGATIVE 02/20/2017 1116   PROTEINUR NEGATIVE 02/20/2017 1116   NITRITE NEGATIVE 02/20/2017 1116   LEUKOCYTESUR NEGATIVE 02/20/2017 1116   Sepsis Labs: @LABRCNTIP (procalcitonin:4,lacticidven:4)  ) Recent Results (from the past 240 hour(s))  Resp Panel by RT-PCR (Flu A&B, Covid) Nasopharyngeal Swab     Status: None   Collection Time: 06/02/20  4:26 PM   Specimen: Nasopharyngeal Swab; Nasopharyngeal(NP) swabs in vial transport medium  Result Value Ref Range Status   SARS Coronavirus 2 by RT PCR NEGATIVE NEGATIVE Final    Comment: (NOTE) SARS-CoV-2 target nucleic acids are NOT DETECTED.  The SARS-CoV-2 RNA is generally detectable in upper respiratory specimens during the acute phase of infection. The lowest  concentration of SARS-CoV-2 viral copies this assay can detect is 138 copies/mL. A negative result does not preclude SARS-Cov-2 infection and should not be used as the sole basis for treatment or other patient management decisions. A negative result may occur with  improper specimen collection/handling, submission of specimen other than nasopharyngeal swab, presence of viral mutation(s) within the areas targeted by this assay, and inadequate number of viral copies(<138 copies/mL). A negative result must be combined with clinical observations, patient history, and epidemiological information. The expected result is Negative.  Fact Sheet for Patients:  BloggerCourse.comhttps://www.fda.gov/media/152166/download  Fact Sheet for Healthcare Providers:  SeriousBroker.ithttps://www.fda.gov/media/152162/download  This test is no t yet approved or cleared by the [place] FDA and  has been authorized for  detection and/or diagnosis of SARS-CoV-2 by FDA under an Emergency Use Authorization (EUA). This EUA will remain  in effect (meaning this test can be used) for the duration of the COVID-19 declaration under Section 564(b)(1) of the Act, 21 U.S.C.section 360bbb-3(b)(1), unless the authorization is terminated  or revoked sooner.       Influenza A by PCR NEGATIVE NEGATIVE Final   Influenza B by PCR NEGATIVE NEGATIVE Final    Comment: (NOTE) The Xpert Xpress SARS-CoV-2/FLU/RSV plus assay is intended as an aid in the diagnosis of influenza from Nasopharyngeal swab specimens and should not be used as a sole basis for treatment. Nasal washings and aspirates are unacceptable for Xpert Xpress SARS-CoV-2/FLU/RSV testing.  Fact Sheet for Patients: BloggerCourse.comhttps://www.fda.gov/media/152166/download  Fact Sheet for Healthcare Providers: SeriousBroker.ithttps://www.fda.gov/media/152162/download  This test is not yet approved or cleared by the [place] FDA and has been authorized for detection and/or diagnosis of SARS-CoV-2 by FDA under an Emergency Use Authorization (EUA). This EUA will remain in effect (meaning this test can be used) for the duration of the COVID-19 declaration under Section 564(b)(1) of the Act, 21 U.S.C. section 360bbb-3(b)(1), unless the authorization is terminated or revoked.  Performed at [facility], [address] [place], [place] [address]          Radiology Studies: DG Chest 2 View  Result Date: 06/02/2020 CLINICAL DATA:  Shortness of breath since this morning, expiratory wheezing, COPD, atrial fibrillation, hypertension EXAM: CHEST - 2 VIEW COMPARISON:  04/20/2019 Correlation: CT chest 07/30/2017 FINDINGS: Normal heart size, mediastinal contours, and pulmonary vascularity. Emphysematous and bronchitic changes consistent with COPD. Chronic interstitial prominence in the mid to lower lungs unchanged. No acute infiltrate, pleural effusion, or pneumothorax. RIGHT  shoulder prosthesis. Diffuse osseous demineralization with thoracic kyphosis and mild anterior compression deformities of adjacent upper thoracic vertebra unchanged. IMPRESSION: COPD changes with chronic interstitial prominence at bases. No acute abnormalities. Electronically Signed   By: Ulyses SouthwardMark  Boles M.D.   On: 06/02/2020 13:28        Scheduled Meds: . amitriptyline  25 mg Oral QHS  . apixaban  5 mg Oral BID  . diltiazem  120 mg Oral Daily  . gabapentin  300 mg Oral TID  . ipratropium-albuterol  3 mL Nebulization Q6H WA  . lisinopril  10 mg Oral Daily  . methylPREDNISolone (SOLU-MEDROL) injection  40 mg Intravenous Q6H  . metoprolol tartrate  50 mg Oral BID  . pantoprazole  40 mg Oral Daily  . QUEtiapine  25 mg Oral QHS   Continuous Infusions: . azithromycin Stopped (06/03/20 0134)     LOS: 1 day    Time spent: 40 min    Silvano BilisNoah B Thailyn Khalid, MD [facility]   If 7PM-7AM, please contact night-coverage [email or web address] Password  [email or web address] 06/03/2020, 8:39 AM

## 2020-06-03 NOTE — ED Notes (Signed)
Pt switched to 6L [place] at this time. 98% on 6L [place]

## 2020-06-03 NOTE — ED Notes (Signed)
This RN at bedside to answer call bell. Pt stating she wants to eat. This RN explaining to pt that she will be weaned off of BiPAP and onto [place] when primary RN is available. Pt verbalizes understanding.

## 2020-06-04 LAB — BASIC METABOLIC PANEL
Anion gap: 8 (ref 5–15)
BUN: 23 mg/dL (ref 8–23)
CO2: 39 mmol/L — ABNORMAL HIGH (ref 22–32)
Calcium: 8.9 mg/dL (ref 8.9–10.3)
Chloride: 89 mmol/L — ABNORMAL LOW (ref 98–111)
Creatinine, Ser: 1.04 mg/dL — ABNORMAL HIGH (ref 0.44–1.00)
GFR, Estimated: 58 mL/min — ABNORMAL LOW (ref >60)
Glucose, Bld: 141 mg/dL — ABNORMAL HIGH (ref 70–99)
Potassium: 4 mmol/L (ref 3.5–5.1)
Sodium: 136 mmol/L (ref 135–145)

## 2020-06-04 MED ORDER — PREDNISONE 10 MG PO TABS: 40.0000 mg | ORAL_TABLET | Freq: Every day | ORAL | 0 refills | Status: DC

## 2020-06-04 MED ORDER — LEVOFLOXACIN 500 MG PO TABS: 500.0000 mg | ORAL_TABLET | Freq: Every day | ORAL | 0 refills | Status: DC

## 2020-06-04 NOTE — Discharge Summary (Signed)
Virginia [name] IDP:[id number] DOB: June 11, 1951 DOA: 06/02/2020  PCP: Marguarite Arbour, MD  Admit date: 06/02/2020 Discharge date: 06/04/2020  Time spent: 35 minutes  Recommendations for Outpatient Follow-up:  1. Close f/u with pulmonology     Discharge Diagnoses:  Principal Problem:   COPD with acute exacerbation (HCC) Active Problems:   Paroxysmal A-fib (HCC)   Depression with anxiety   Acute respiratory failure with hypoxia (HCC)   Shortness of breath   GERD (gastroesophageal reflux disease)   Discharge Condition: fair  Diet recommendation: heart healthy  Filed Weights   06/02/20 1255 06/04/20 0400  Weight: 54.4 kg 54.6 kg    History of present illness:  Virginia [name] is a 69 y.o. female with medical history significant for chronic hypoxic respiratory failure on 4 L continuous elemental oxygen at home, COPD, paroxysmal atrial fibrillation chronically anticoagulated on Eliquis, generalized anxiety disorder, who is admitted to [facility] on 06/02/2020 with acute COPD exacerbation after presenting from home to [facility] Emergency Department complaining of shortness of breath.   The patient reports 4 to 5 days of progressive shortness of breath associated with new onset nonproductive cough.  She denies any associated orthopnea, PND, or worsening of peripheral edema.  Denies any lower extremity erythema, calf tenderness, denies any associated chest pain, diaphoresis, palpitations vomiting.  No recent trauma or travel.  No recent interventions or periods of prolonged diminished ambulatory activity.  Personal history of DVT/PE.  Denies any recent subjective fever, chills, rigors, or generalized myalgias.  Denies any associated neck stiffness, rhinitis, rhinorrhea, sore throat, abdominal pain, diarrhea, or rash.  Denies any known recent COVID-19 exposures.  She confirms that she is a former smoker, having smoked approximately 1 to 1.5 packs/day for at least 20  to 30 years, before completely quitting smoking in 2017 without subsequent resumption.  Hospital Course:  #Acute COPD exacerbation # Acute on chronic hypoxic respiratory failure Baseline severe COPD. CXR without acute findings. Here requiring bipap initially, now weaned off and on baseline O2 of 4 L, says breathing is back to baseline, actually a bit improved. Treated with corticosteroids and levaquin.  - home w/ 5 day course presnisone (hold home low dose prednisone while taking burst) and 5 days levaquin - close f/u Dr. Meredeth Ide her pulmonologist  #Paroxysmal atrial fibrillation Here RR, sinus. - cont home metoprolol, diltiazem, apixaban, lasix    Procedures:  none   Consultations:  none  Discharge Exam: Vitals:   06/04/20 0733 06/04/20 1141  BP: (!) 143/81 122/89  Pulse: 65 64  Resp: 11 18  Temp: 98.1 F (36.7 C) (!) 97.3 F (36.3 C)  SpO2: 100% 96%    General exam: Appears calm and comfortable  Respiratory system: no wheeze, scattered faint rhonchi Cardiovascular system: S1 & S2 heard, RRR. No pedal edema Gastrointestinal system: Abdomen is nondistended, soft and nontender. No organomegaly or masses felt. Normal bowel sounds heard. Central nervous system: Alert and oriented. No focal neurological deficits. Extremities: Symmetric 5 x 5 power. Skin: No rashes, lesions or ulcers Psychiatry: Judgement and insight appear normal. Mood & affect appropriate.   Discharge Instructions   Discharge Instructions    Diet - low sodium heart healthy   Complete by: As directed    Increase activity slowly   Complete by: As directed      Allergies as of 06/04/2020   No Known Allergies     Medication List    TAKE these medications   amitriptyline 25 MG tablet Commonly  known as: ELAVIL Take 25 mg by mouth at bedtime.   apixaban 5 MG Tabs tablet Commonly known as: Eliquis Take 1 tablet (5 mg total) by mouth 2 (two) times daily.   diazepam 5 MG tablet Commonly  known as: VALIUM Take 5 mg by mouth every 8 (eight) hours as needed for anxiety.   diltiazem 120 MG 24 hr capsule Commonly known as: CARDIZEM CD Take 120 mg by mouth daily.   estradiol 1 MG tablet Commonly known as: ESTRACE Take 1 mg by mouth daily.   furosemide 20 MG tablet Commonly known as: LASIX Take 20 mg by mouth daily.   gabapentin 300 MG capsule Commonly known as: NEURONTIN Take 300 mg by mouth 3 (three) times daily.   levofloxacin 500 MG tablet Commonly known as: Levaquin Take 1 tablet (500 mg total) by mouth daily for 10 days.   lisinopril 10 MG tablet Commonly known as: ZESTRIL Take 10 mg by mouth daily.   MAGnesium-Oxide 400 (241.3 Mg) MG tablet Generic drug: magnesium oxide Take 400 mg by mouth daily.   metoprolol tartrate 50 MG tablet Commonly known as: LOPRESSOR Take 50 mg by mouth 2 (two) times daily.   multivitamin tablet Take 1 tablet by mouth daily.   omeprazole 40 MG capsule Commonly known as: PRILOSEC Take 40 mg by mouth daily.   oxyCODONE-acetaminophen 10-325 MG tablet Commonly known as: PERCOCET Take 1 tablet by mouth 3 (three) times daily.   predniSONE 5 MG tablet Commonly known as: DELTASONE Take 5 mg by mouth daily. What changed: Another medication with the same name was added. Make sure you understand how and when to take each.   predniSONE 10 MG tablet Commonly known as: DELTASONE Take 4 tablets (40 mg total) by mouth daily. What changed: You were already taking a medication with the same name, and this prescription was added. Make sure you understand how and when to take each.   ProAir HFA 108 (90 Base) MCG/ACT inhaler Generic drug: albuterol Inhale 2 puffs into the lungs every 6 (six) hours as needed for wheezing or shortness of breath.   albuterol (2.5 MG/3ML) 0.083% nebulizer solution Commonly known as: PROVENTIL Take 2.5 mg by nebulization.   QUEtiapine 25 MG tablet Commonly known as: SEROQUEL Take 25 mg by mouth at  bedtime.      No Known Allergies    The results of significant diagnostics from this hospitalization (including imaging, microbiology, ancillary and laboratory) are listed below for reference.    Significant Diagnostic Studies: DG Chest 2 View  Result Date: 06/02/2020 CLINICAL DATA:  Shortness of breath since this morning, expiratory wheezing, COPD, atrial fibrillation, hypertension EXAM: CHEST - 2 VIEW COMPARISON:  04/20/2019 Correlation: CT chest 07/30/2017 FINDINGS: Normal heart size, mediastinal contours, and pulmonary vascularity. Emphysematous and bronchitic changes consistent with COPD. Chronic interstitial prominence in the mid to lower lungs unchanged. No acute infiltrate, pleural effusion, or pneumothorax. RIGHT shoulder prosthesis. Diffuse osseous demineralization with thoracic kyphosis and mild anterior compression deformities of adjacent upper thoracic vertebra unchanged. IMPRESSION: COPD changes with chronic interstitial prominence at bases. No acute abnormalities. Electronically Signed   By: Ulyses SouthwardMark  Boles M.D.   On: 06/02/2020 13:28    Microbiology: Recent Results (from the past 240 hour(s))  Resp Panel by RT-PCR (Flu A&B, Covid) Nasopharyngeal Swab     Status: None   Collection Time: 06/02/20  4:26 PM   Specimen: Nasopharyngeal Swab; Nasopharyngeal(NP) swabs in vial transport medium  Result Value Ref Range Status   SARS  Coronavirus 2 by RT PCR NEGATIVE NEGATIVE Final    Comment: (NOTE) SARS-CoV-2 target nucleic acids are NOT DETECTED.  The SARS-CoV-2 RNA is generally detectable in upper respiratory specimens during the acute phase of infection. The lowest concentration of SARS-CoV-2 viral copies this assay can detect is 138 copies/mL. A negative result does not preclude SARS-Cov-2 infection and should not be used as the sole basis for treatment or other patient management decisions. A negative result may occur with  improper specimen collection/handling, submission of  specimen other than nasopharyngeal swab, presence of viral mutation(s) within the areas targeted by this assay, and inadequate number of viral copies(<138 copies/mL). A negative result must be combined with clinical observations, patient history, and epidemiological information. The expected result is Negative.  Fact Sheet for Patients:  BloggerCourse.com  Fact Sheet for Healthcare Providers:  SeriousBroker.it  This test is no t yet approved or cleared by the [place] FDA and  has been authorized for detection and/or diagnosis of SARS-CoV-2 by FDA under an Emergency Use Authorization (EUA). This EUA will remain  in effect (meaning this test can be used) for the duration of the COVID-19 declaration under Section 564(b)(1) of the Act, 21 U.S.C.section 360bbb-3(b)(1), unless the authorization is terminated  or revoked sooner.       Influenza A by PCR NEGATIVE NEGATIVE Final   Influenza B by PCR NEGATIVE NEGATIVE Final    Comment: (NOTE) The Xpert Xpress SARS-CoV-2/FLU/RSV plus assay is intended as an aid in the diagnosis of influenza from Nasopharyngeal swab specimens and should not be used as a sole basis for treatment. Nasal washings and aspirates are unacceptable for Xpert Xpress SARS-CoV-2/FLU/RSV testing.  Fact Sheet for Patients: BloggerCourse.com  Fact Sheet for Healthcare Providers: SeriousBroker.it  This test is not yet approved or cleared by the [place] FDA and has been authorized for detection and/or diagnosis of SARS-CoV-2 by FDA under an Emergency Use Authorization (EUA). This EUA will remain in effect (meaning this test can be used) for the duration of the COVID-19 declaration under Section 564(b)(1) of the Act, 21 U.S.C. section 360bbb-3(b)(1), unless the authorization is terminated or revoked.  Performed at [facility], [address] [place], [place] [address]      Labs: Basic Metabolic Panel: Recent Labs  Lab 06/02/20 1258 06/03/20 0434 06/04/20 0326  NA 137 135 136  K 3.7 4.6 4.0  CL 85* 87* 89*  CO2 41* 37* 39*  GLUCOSE 87 121* 141*  BUN 10 14 23   CREATININE 0.76 0.75 1.04*  CALCIUM 9.2 8.8* 8.9  MG 2.0 2.4  --   PHOS  --  3.0  --    Liver Function Tests: Recent Labs  Lab 06/03/20 0434  AST 26  ALT 21  ALKPHOS 14*  BILITOT 0.7  PROT 6.3*  ALBUMIN 3.4*   No results for input(s): LIPASE, AMYLASE in the last 168 hours. No results for input(s): AMMONIA in the last 168 hours. CBC: Recent Labs  Lab 06/02/20 1258 06/03/20 0434  WBC 6.9 4.5  NEUTROABS  --  4.2  HGB 10.0* 9.6*  HCT 32.5* 30.5*  MCV 100.6* 98.4  PLT 217 213   Cardiac Enzymes: No results for input(s): CKTOTAL, CKMB, CKMBINDEX, TROPONINI in the last 168 hours. BNP: BNP (last 3 results) No results for input(s): BNP in the last 8760 hours.  ProBNP (last 3 results) No results for input(s): PROBNP in the last 8760 hours.  CBG: No results for input(s): GLUCAP in the last  168 hours.     Signed:  Silvano Bilis MD.  [facility] 06/04/2020, 12:49 PM

## 2020-06-04 NOTE — Plan of Care (Signed)
  Problem: Education: Goal: Knowledge of General Education information will improve Description: Including pain rating scale, medication(s)/side effects and non-pharmacologic comfort measures Outcome: Progressing   Problem: Clinical Measurements: Goal: Will remain free from infection Outcome: Progressing Goal: Respiratory complications will improve Outcome: Progressing   Problem: Coping: Goal: Level of anxiety will decrease Outcome: Progressing   Problem: Pain Managment: Goal: General experience of comfort will improve Outcome: Progressing   Problem: Safety: Goal: Ability to remain free from injury will improve Outcome: Progressing

## 2020-06-04 NOTE — Discharge Instructions (Signed)

## 2020-06-05 ENCOUNTER — Other Ambulatory Visit: Payer: Self-pay

## 2020-06-06 ENCOUNTER — Other Ambulatory Visit: Payer: Self-pay

## 2020-06-06 ENCOUNTER — Emergency Department: Payer: Medicare Other

## 2020-06-06 ENCOUNTER — Inpatient Hospital Stay
Admission: EM | Admit: 2020-06-06 | Discharge: 2020-08-11 | DRG: 004 | Disposition: A | Discharge status: Home Health | Payer: Medicare Other | Attending: Internal Medicine | Admitting: Internal Medicine

## 2020-06-06 ENCOUNTER — Inpatient Hospital Stay: Payer: Self-pay

## 2020-06-06 DIAGNOSIS — T40601A Poisoning by unspecified narcotics, accidental (unintentional), initial encounter: Secondary | ICD-10-CM | POA: Diagnosis present

## 2020-06-06 DIAGNOSIS — I5033 Acute on chronic diastolic (congestive) heart failure: Secondary | ICD-10-CM | POA: Diagnosis not present

## 2020-06-06 DIAGNOSIS — J441 Chronic obstructive pulmonary disease with (acute) exacerbation: Secondary | ICD-10-CM | POA: Diagnosis present

## 2020-06-06 DIAGNOSIS — I110 Hypertensive heart disease with heart failure: Secondary | ICD-10-CM | POA: Diagnosis present

## 2020-06-06 DIAGNOSIS — R9389 Abnormal findings on diagnostic imaging of other specified body structures: Secondary | ICD-10-CM

## 2020-06-06 DIAGNOSIS — J9602 Acute respiratory failure with hypercapnia: Secondary | ICD-10-CM

## 2020-06-06 DIAGNOSIS — Z96611 Presence of right artificial shoulder joint: Secondary | ICD-10-CM | POA: Diagnosis present

## 2020-06-06 DIAGNOSIS — G629 Polyneuropathy, unspecified: Secondary | ICD-10-CM | POA: Diagnosis present

## 2020-06-06 DIAGNOSIS — R0602 Shortness of breath: Secondary | ICD-10-CM | POA: Diagnosis present

## 2020-06-06 DIAGNOSIS — E872 Acidosis: Secondary | ICD-10-CM | POA: Diagnosis present

## 2020-06-06 DIAGNOSIS — Z515 Encounter for palliative care: Secondary | ICD-10-CM | POA: Diagnosis not present

## 2020-06-06 DIAGNOSIS — J690 Pneumonitis due to inhalation of food and vomit: Secondary | ICD-10-CM | POA: Diagnosis not present

## 2020-06-06 DIAGNOSIS — R339 Retention of urine, unspecified: Secondary | ICD-10-CM | POA: Diagnosis not present

## 2020-06-06 DIAGNOSIS — R338 Other retention of urine: Secondary | ICD-10-CM | POA: Diagnosis not present

## 2020-06-06 DIAGNOSIS — R739 Hyperglycemia, unspecified: Secondary | ICD-10-CM | POA: Diagnosis present

## 2020-06-06 DIAGNOSIS — D638 Anemia in other chronic diseases classified elsewhere: Secondary | ICD-10-CM | POA: Diagnosis not present

## 2020-06-06 DIAGNOSIS — D75838 Other thrombocytosis: Secondary | ICD-10-CM

## 2020-06-06 DIAGNOSIS — K909 Intestinal malabsorption, unspecified: Secondary | ICD-10-CM | POA: Diagnosis not present

## 2020-06-06 DIAGNOSIS — G9340 Encephalopathy, unspecified: Secondary | ICD-10-CM | POA: Diagnosis not present

## 2020-06-06 DIAGNOSIS — E43 Unspecified severe protein-calorie malnutrition: Secondary | ICD-10-CM | POA: Diagnosis not present

## 2020-06-06 DIAGNOSIS — J449 Chronic obstructive pulmonary disease, unspecified: Secondary | ICD-10-CM

## 2020-06-06 DIAGNOSIS — F419 Anxiety disorder, unspecified: Secondary | ICD-10-CM | POA: Diagnosis present

## 2020-06-06 DIAGNOSIS — Z87891 Personal history of nicotine dependence: Secondary | ICD-10-CM

## 2020-06-06 DIAGNOSIS — R197 Diarrhea, unspecified: Secondary | ICD-10-CM

## 2020-06-06 DIAGNOSIS — E871 Hypo-osmolality and hyponatremia: Secondary | ICD-10-CM | POA: Diagnosis present

## 2020-06-06 DIAGNOSIS — Z20822 Contact with and (suspected) exposure to covid-19: Secondary | ICD-10-CM | POA: Diagnosis present

## 2020-06-06 DIAGNOSIS — R54 Age-related physical debility: Secondary | ICD-10-CM | POA: Diagnosis present

## 2020-06-06 DIAGNOSIS — Z7189 Other specified counseling: Secondary | ICD-10-CM | POA: Diagnosis not present

## 2020-06-06 DIAGNOSIS — I712 Thoracic aortic aneurysm, without rupture: Secondary | ICD-10-CM | POA: Diagnosis present

## 2020-06-06 DIAGNOSIS — J440 Chronic obstructive pulmonary disease with acute lower respiratory infection: Secondary | ICD-10-CM

## 2020-06-06 DIAGNOSIS — G9341 Metabolic encephalopathy: Secondary | ICD-10-CM | POA: Diagnosis not present

## 2020-06-06 DIAGNOSIS — Y95 Nosocomial condition: Secondary | ICD-10-CM | POA: Diagnosis not present

## 2020-06-06 DIAGNOSIS — G928 Other toxic encephalopathy: Secondary | ICD-10-CM | POA: Diagnosis present

## 2020-06-06 DIAGNOSIS — J9621 Acute and chronic respiratory failure with hypoxia: Secondary | ICD-10-CM

## 2020-06-06 DIAGNOSIS — T40605A Adverse effect of unspecified narcotics, initial encounter: Secondary | ICD-10-CM | POA: Diagnosis present

## 2020-06-06 DIAGNOSIS — B182 Chronic viral hepatitis C: Secondary | ICD-10-CM | POA: Diagnosis present

## 2020-06-06 DIAGNOSIS — I480 Paroxysmal atrial fibrillation: Secondary | ICD-10-CM | POA: Diagnosis present

## 2020-06-06 DIAGNOSIS — M797 Fibromyalgia: Secondary | ICD-10-CM | POA: Diagnosis present

## 2020-06-06 DIAGNOSIS — J9690 Respiratory failure, unspecified, unspecified whether with hypoxia or hypercapnia: Secondary | ICD-10-CM | POA: Diagnosis present

## 2020-06-06 DIAGNOSIS — E860 Dehydration: Secondary | ICD-10-CM | POA: Diagnosis present

## 2020-06-06 DIAGNOSIS — R630 Anorexia: Secondary | ICD-10-CM

## 2020-06-06 DIAGNOSIS — J9601 Acute respiratory failure with hypoxia: Secondary | ICD-10-CM

## 2020-06-06 DIAGNOSIS — Z978 Presence of other specified devices: Secondary | ICD-10-CM

## 2020-06-06 DIAGNOSIS — G2581 Restless legs syndrome: Secondary | ICD-10-CM | POA: Diagnosis present

## 2020-06-06 DIAGNOSIS — Z7901 Long term (current) use of anticoagulants: Secondary | ICD-10-CM

## 2020-06-06 DIAGNOSIS — K219 Gastro-esophageal reflux disease without esophagitis: Secondary | ICD-10-CM | POA: Diagnosis present

## 2020-06-06 DIAGNOSIS — F13230 Sedative, hypnotic or anxiolytic dependence with withdrawal, uncomplicated: Secondary | ICD-10-CM | POA: Diagnosis not present

## 2020-06-06 DIAGNOSIS — R1310 Dysphagia, unspecified: Secondary | ICD-10-CM

## 2020-06-06 DIAGNOSIS — Z79899 Other long term (current) drug therapy: Secondary | ICD-10-CM

## 2020-06-06 DIAGNOSIS — M810 Age-related osteoporosis without current pathological fracture: Secondary | ICD-10-CM | POA: Diagnosis present

## 2020-06-06 DIAGNOSIS — N179 Acute kidney failure, unspecified: Secondary | ICD-10-CM | POA: Diagnosis present

## 2020-06-06 DIAGNOSIS — M25572 Pain in left ankle and joints of left foot: Secondary | ICD-10-CM

## 2020-06-06 DIAGNOSIS — M1990 Unspecified osteoarthritis, unspecified site: Secondary | ICD-10-CM | POA: Diagnosis present

## 2020-06-06 DIAGNOSIS — J9622 Acute and chronic respiratory failure with hypercapnia: Secondary | ICD-10-CM | POA: Diagnosis present

## 2020-06-06 DIAGNOSIS — Z8249 Family history of ischemic heart disease and other diseases of the circulatory system: Secondary | ICD-10-CM

## 2020-06-06 DIAGNOSIS — L8990 Pressure ulcer of unspecified site, unspecified stage: Secondary | ICD-10-CM | POA: Insufficient documentation

## 2020-06-06 DIAGNOSIS — Z4659 Encounter for fitting and adjustment of other gastrointestinal appliance and device: Secondary | ICD-10-CM

## 2020-06-06 DIAGNOSIS — J9600 Acute respiratory failure, unspecified whether with hypoxia or hypercapnia: Secondary | ICD-10-CM

## 2020-06-06 DIAGNOSIS — D649 Anemia, unspecified: Secondary | ICD-10-CM | POA: Diagnosis not present

## 2020-06-06 DIAGNOSIS — Z6822 Body mass index (BMI) 22.0-22.9, adult: Secondary | ICD-10-CM

## 2020-06-06 DIAGNOSIS — F32A Depression, unspecified: Secondary | ICD-10-CM | POA: Diagnosis present

## 2020-06-06 DIAGNOSIS — Z9981 Dependence on supplemental oxygen: Secondary | ICD-10-CM

## 2020-06-06 DIAGNOSIS — L89322 Pressure ulcer of left buttock, stage 2: Secondary | ICD-10-CM | POA: Diagnosis present

## 2020-06-06 DIAGNOSIS — Z9889 Other specified postprocedural states: Secondary | ICD-10-CM

## 2020-06-06 DIAGNOSIS — J156 Pneumonia due to other aerobic Gram-negative bacteria: Secondary | ICD-10-CM | POA: Diagnosis not present

## 2020-06-06 DIAGNOSIS — Z7952 Long term (current) use of systemic steroids: Secondary | ICD-10-CM

## 2020-06-06 DIAGNOSIS — K591 Functional diarrhea: Secondary | ICD-10-CM | POA: Diagnosis not present

## 2020-06-06 DIAGNOSIS — R4781 Slurred speech: Secondary | ICD-10-CM | POA: Diagnosis not present

## 2020-06-06 DIAGNOSIS — I959 Hypotension, unspecified: Secondary | ICD-10-CM | POA: Diagnosis present

## 2020-06-06 LAB — COMPREHENSIVE METABOLIC PANEL
ALT: 46 U/L — ABNORMAL HIGH (ref 0–44)
AST: 48 U/L — ABNORMAL HIGH (ref 15–41)
Albumin: 3.9 g/dL (ref 3.5–5.0)
Alkaline Phosphatase: 17 U/L — ABNORMAL LOW (ref 38–126)
Anion gap: 11 (ref 5–15)
BUN: 42 mg/dL — ABNORMAL HIGH (ref 8–23)
CO2: 34 mmol/L — ABNORMAL HIGH (ref 22–32)
Calcium: 8.5 mg/dL — ABNORMAL LOW (ref 8.9–10.3)
Chloride: 76 mmol/L — ABNORMAL LOW (ref 98–111)
Creatinine, Ser: 2.78 mg/dL — ABNORMAL HIGH (ref 0.44–1.00)
GFR, Estimated: 18 mL/min — ABNORMAL LOW (ref >60)
Glucose, Bld: 163 mg/dL — ABNORMAL HIGH (ref 70–99)
Potassium: 4.2 mmol/L (ref 3.5–5.1)
Sodium: 121 mmol/L — ABNORMAL LOW (ref 135–145)
Total Bilirubin: 0.6 mg/dL (ref 0.3–1.2)
Total Protein: 6.9 g/dL (ref 6.5–8.1)

## 2020-06-06 LAB — BLOOD GAS, VENOUS
Acid-Base Excess: 7.5 mmol/L — ABNORMAL HIGH (ref 0.0–2.0)
Bicarbonate: 40.9 mmol/L — ABNORMAL HIGH (ref 20.0–28.0)
O2 Saturation: 47.6 %
Patient temperature: 37
pCO2, Ven: >120 mmHg (ref 44.0–60.0)
pH, Ven: 7.08 — CL (ref 7.250–7.430)
pO2, Ven: 37 mmHg (ref 32.0–45.0)

## 2020-06-06 LAB — CBC WITH DIFFERENTIAL/PLATELET
Abs Immature Granulocytes: 0.13 10*3/uL — ABNORMAL HIGH (ref 0.00–0.07)
Basophils Absolute: 0 10*3/uL (ref 0.0–0.1)
Basophils Relative: 0 %
Eosinophils Absolute: 0 10*3/uL (ref 0.0–0.5)
Eosinophils Relative: 0 %
HCT: 35 % — ABNORMAL LOW (ref 36.0–46.0)
Hemoglobin: 10.7 g/dL — ABNORMAL LOW (ref 12.0–15.0)
Immature Granulocytes: 1 %
Lymphocytes Relative: 4 %
Lymphs Abs: 0.5 10*3/uL — ABNORMAL LOW (ref 0.7–4.0)
MCH: 30.4 pg (ref 26.0–34.0)
MCHC: 30.6 g/dL (ref 30.0–36.0)
MCV: 99.4 fL (ref 80.0–100.0)
Monocytes Absolute: 0.5 10*3/uL (ref 0.1–1.0)
Monocytes Relative: 4 %
Neutro Abs: 12.8 10*3/uL — ABNORMAL HIGH (ref 1.7–7.7)
Neutrophils Relative %: 91 %
Platelets: 304 10*3/uL (ref 150–400)
RBC: 3.52 MIL/uL — ABNORMAL LOW (ref 3.87–5.11)
RDW: 13.2 % (ref 11.5–15.5)
WBC: 13.9 10*3/uL — ABNORMAL HIGH (ref 4.0–10.5)
nRBC: 0 % (ref 0.0–0.2)

## 2020-06-06 LAB — BLOOD GAS, ARTERIAL
Acid-Base Excess: 2.2 mmol/L — ABNORMAL HIGH (ref 0.0–2.0)
Bicarbonate: 31.7 mmol/L — ABNORMAL HIGH (ref 20.0–28.0)
FIO2: 0.36
MECHVT: 420 mL
O2 Saturation: 98.4 %
PEEP: 5 cmH2O
Patient temperature: 35.3
RATE: 18 {breaths}/min
pCO2 arterial: 75 mmHg (ref 32.0–48.0)
pH, Arterial: 7.22 — ABNORMAL LOW (ref 7.350–7.450)
pO2, Arterial: 123 mmHg — ABNORMAL HIGH (ref 83.0–108.0)

## 2020-06-06 LAB — RESP PANEL BY RT-PCR (FLU A&B, COVID) ARPGX2
Influenza A by PCR: NEGATIVE
Influenza B by PCR: NEGATIVE
SARS Coronavirus 2 by RT PCR: NEGATIVE

## 2020-06-06 LAB — TROPONIN I (HIGH SENSITIVITY)
Troponin I (High Sensitivity): 15 ng/L (ref <18)
Troponin I (High Sensitivity): 13 ng/L

## 2020-06-06 LAB — LACTIC ACID, PLASMA: Lactic Acid, Venous: 1.4 mmol/L (ref 0.5–1.9)

## 2020-06-06 LAB — CBG MONITORING, ED: Glucose-Capillary: 165 mg/dL — ABNORMAL HIGH (ref 70–99)

## 2020-06-06 LAB — PROCALCITONIN: Procalcitonin: <0.1 ng/mL

## 2020-06-06 MED ORDER — MIDAZOLAM HCL 2 MG/2ML IJ SOLN
4.0000 mg | Freq: Once | INTRAMUSCULAR | Status: AC
  Administered 2020-06-06: 4 mg via INTRAVENOUS

## 2020-06-06 MED ORDER — DOCUSATE SODIUM 100 MG PO CAPS: 100.0000 mg | ORAL_CAPSULE | Freq: Two times a day (BID) | ORAL | Status: DC | PRN

## 2020-06-06 MED ORDER — FENTANYL CITRATE (PF) 100 MCG/2ML IJ SOLN
50.0000 ug | Freq: Once | INTRAMUSCULAR | Status: AC
  Administered 2020-06-06: 50 ug via INTRAVENOUS

## 2020-06-06 MED ORDER — FENTANYL BOLUS VIA INFUSION
25.0000 ug | INTRAVENOUS | Status: DC | PRN
  Administered 2020-06-07 – 2020-06-08 (×4): 25 ug via INTRAVENOUS
  Filled 2020-06-06: qty 25

## 2020-06-06 MED ORDER — HYDROMORPHONE HCL 1 MG/ML IJ SOLN
1.0000 mg | Freq: Once | INTRAMUSCULAR | Status: AC
  Administered 2020-06-06: 1 mg via INTRAVENOUS
  Filled 2020-06-06: qty 1

## 2020-06-06 MED ORDER — SODIUM CHLORIDE 0.9% FLUSH: 3.0000 mL | INTRAVENOUS | Status: DC | PRN

## 2020-06-06 MED ORDER — FENTANYL CITRATE (PF) 100 MCG/2ML IJ SOLN: 25.0000 ug | Freq: Once | INTRAMUSCULAR | Status: DC

## 2020-06-06 MED ORDER — ONDANSETRON HCL 4 MG/2ML IJ SOLN
4.0000 mg | Freq: Four times a day (QID) | INTRAMUSCULAR | Status: DC | PRN
  Administered 2020-07-02 – 2020-07-19 (×5): 4 mg via INTRAVENOUS
  Filled 2020-06-06 (×5): qty 2

## 2020-06-06 MED ORDER — FENTANYL 2500MCG IN NS 250ML (10MCG/ML) PREMIX INFUSION: 25.0000 ug/h | INTRAVENOUS | Status: DC

## 2020-06-06 MED ORDER — SODIUM CHLORIDE 0.9 % IV BOLUS
1000.0000 mL | Freq: Once | INTRAVENOUS | Status: AC
  Administered 2020-06-06: 1000 mL via INTRAVENOUS

## 2020-06-06 MED ORDER — ETOMIDATE 2 MG/ML IV SOLN
15.0000 mg | Freq: Once | INTRAVENOUS | Status: AC
  Administered 2020-06-06: 15 mg via INTRAVENOUS

## 2020-06-06 MED ORDER — SODIUM CHLORIDE 0.9 % IV BOLUS
500.0000 mL | Freq: Once | INTRAVENOUS | Status: AC
  Administered 2020-06-06: 500 mL via INTRAVENOUS

## 2020-06-06 MED ORDER — IPRATROPIUM-ALBUTEROL 0.5-2.5 (3) MG/3ML IN SOLN
3.0000 mL | Freq: Four times a day (QID) | RESPIRATORY_TRACT | Status: DC | PRN
  Administered 2020-06-22 – 2020-08-10 (×15): 3 mL via RESPIRATORY_TRACT
  Filled 2020-06-06 (×17): qty 3

## 2020-06-06 MED ORDER — FENTANYL CITRATE (PF) 100 MCG/2ML IJ SOLN: 100.0000 ug | Freq: Once | INTRAMUSCULAR | Status: DC

## 2020-06-06 MED ORDER — SUCCINYLCHOLINE CHLORIDE 20 MG/ML IJ SOLN
80.0000 mg | Freq: Once | INTRAMUSCULAR | Status: AC
  Administered 2020-06-06: 80 mg via INTRAVENOUS

## 2020-06-06 MED ORDER — IPRATROPIUM-ALBUTEROL 0.5-2.5 (3) MG/3ML IN SOLN
RESPIRATORY_TRACT | Status: AC
  Administered 2020-06-06: 3 mL via RESPIRATORY_TRACT
  Filled 2020-06-06: qty 9

## 2020-06-06 MED ORDER — FAMOTIDINE IN NACL 20-0.9 MG/50ML-% IV SOLN
20.0000 mg | Freq: Every day | INTRAVENOUS | Status: DC
  Administered 2020-06-06: 20 mg via INTRAVENOUS
  Filled 2020-06-06 (×2): qty 50

## 2020-06-06 MED ORDER — PANTOPRAZOLE SODIUM 40 MG IV SOLR
40.0000 mg | INTRAVENOUS | Status: DC
  Administered 2020-06-06 – 2020-06-08 (×3): 40 mg via INTRAVENOUS
  Filled 2020-06-06 (×3): qty 40

## 2020-06-06 MED ORDER — FENTANYL 2500MCG IN NS 250ML (10MCG/ML) PREMIX INFUSION
25.0000 ug/h | INTRAVENOUS | Status: DC
  Administered 2020-06-06: 25 ug/h via INTRAVENOUS
  Administered 2020-06-07 (×2): 350 ug/h via INTRAVENOUS
  Administered 2020-06-07: 150 ug/h via INTRAVENOUS
  Administered 2020-06-08: 350 ug/h via INTRAVENOUS
  Filled 2020-06-06 (×5): qty 250

## 2020-06-06 MED ORDER — NALOXONE HCL 2 MG/2ML IJ SOSY
1.0000 mg | PREFILLED_SYRINGE | Freq: Once | INTRAMUSCULAR | Status: AC
  Administered 2020-06-06: 1 mg via INTRAVENOUS

## 2020-06-06 MED ORDER — POLYETHYLENE GLYCOL 3350 17 G PO PACK
17.0000 g | PACK | Freq: Every day | ORAL | Status: DC
  Administered 2020-06-07 – 2020-06-09 (×3): 17 g
  Filled 2020-06-06 (×3): qty 1

## 2020-06-06 MED ORDER — CHLORHEXIDINE GLUCONATE CLOTH 2 % EX PADS
6.0000 | MEDICATED_PAD | Freq: Every day | CUTANEOUS | Status: DC
  Administered 2020-06-07 – 2020-07-21 (×39): 6 via TOPICAL

## 2020-06-06 MED ORDER — MIDAZOLAM HCL 2 MG/2ML IJ SOLN
1.0000 mg | INTRAMUSCULAR | Status: DC | PRN
  Administered 2020-06-07 – 2020-06-08 (×5): 1 mg via INTRAVENOUS
  Filled 2020-06-06 (×2): qty 2

## 2020-06-06 MED ORDER — NALOXONE HCL 2 MG/2ML IJ SOSY
PREFILLED_SYRINGE | INTRAMUSCULAR | Status: AC
  Filled 2020-06-06: qty 2

## 2020-06-06 MED ORDER — DEXMEDETOMIDINE HCL IN NACL 400 MCG/100ML IV SOLN
0.4000 ug/kg/h | INTRAVENOUS | Status: DC
  Administered 2020-06-06: 0.4 ug/kg/h via INTRAVENOUS
  Filled 2020-06-06: qty 100

## 2020-06-06 MED ORDER — FENTANYL CITRATE (PF) 100 MCG/2ML IJ SOLN
100.0000 ug | Freq: Once | INTRAMUSCULAR | Status: AC
  Administered 2020-06-06: 100 ug via INTRAVENOUS

## 2020-06-06 MED ORDER — SODIUM CHLORIDE 0.9 % IV SOLN
2.0000 g | Freq: Once | INTRAVENOUS | Status: AC
  Administered 2020-06-06: 2 g via INTRAVENOUS
  Filled 2020-06-06: qty 20

## 2020-06-06 MED ORDER — MIDAZOLAM HCL 2 MG/2ML IJ SOLN: 1.0000 mg | INTRAMUSCULAR | Status: DC | PRN

## 2020-06-06 MED ORDER — SODIUM CHLORIDE 0.9% FLUSH
3.0000 mL | Freq: Two times a day (BID) | INTRAVENOUS | Status: DC
  Administered 2020-06-06 – 2020-07-15 (×66): 3 mL via INTRAVENOUS

## 2020-06-06 MED ORDER — NOREPINEPHRINE 4 MG/250ML-% IV SOLN
INTRAVENOUS | Status: AC
  Administered 2020-06-06: 4 ug/min via INTRAVENOUS
  Filled 2020-06-06: qty 250

## 2020-06-06 MED ORDER — SODIUM CHLORIDE 0.9 % IV SOLN
250.0000 mL | INTRAVENOUS | Status: DC
  Administered 2020-06-10 – 2020-06-23 (×2): 250 mL via INTRAVENOUS

## 2020-06-06 MED ORDER — INSULIN ASPART 100 UNIT/ML ~~LOC~~ SOLN
0.0000 [IU] | SUBCUTANEOUS | Status: DC
  Administered 2020-06-06: 2 [IU] via SUBCUTANEOUS
  Administered 2020-06-07: 1 [IU] via SUBCUTANEOUS
  Administered 2020-06-07: 2 [IU] via SUBCUTANEOUS
  Administered 2020-06-07 (×3): 1 [IU] via SUBCUTANEOUS
  Administered 2020-06-07: 2 [IU] via SUBCUTANEOUS
  Administered 2020-06-08 (×3): 1 [IU] via SUBCUTANEOUS
  Administered 2020-06-08: 2 [IU] via SUBCUTANEOUS
  Administered 2020-06-09: 1 [IU] via SUBCUTANEOUS
  Filled 2020-06-06 (×8): qty 1

## 2020-06-06 MED ORDER — MIDAZOLAM HCL 2 MG/2ML IJ SOLN: 2.0000 mg | Freq: Once | INTRAMUSCULAR | Status: DC

## 2020-06-06 MED ORDER — DOCUSATE SODIUM 50 MG/5ML PO LIQD
100.0000 mg | Freq: Two times a day (BID) | ORAL | Status: DC
  Administered 2020-06-06 – 2020-06-09 (×5): 100 mg
  Filled 2020-06-06 (×7): qty 10

## 2020-06-06 MED ORDER — IPRATROPIUM-ALBUTEROL 0.5-2.5 (3) MG/3ML IN SOLN: 3.0000 mL | Freq: Once | RESPIRATORY_TRACT | Status: AC

## 2020-06-06 MED ORDER — MIDAZOLAM HCL 2 MG/2ML IJ SOLN
1.0000 mg | INTRAMUSCULAR | Status: DC | PRN
  Administered 2020-06-06: 1 mg via INTRAVENOUS

## 2020-06-06 MED ORDER — MAGNESIUM SULFATE 2 GM/50ML IV SOLN
2.0000 g | Freq: Once | INTRAVENOUS | Status: AC
  Administered 2020-06-06: 2 g via INTRAVENOUS
  Filled 2020-06-06: qty 50

## 2020-06-06 MED ORDER — SODIUM CHLORIDE 0.9 % IV SOLN: 500.0000 mg | INTRAVENOUS | Status: DC

## 2020-06-06 MED ORDER — FAMOTIDINE IN NACL 20-0.9 MG/50ML-% IV SOLN: 20.0000 mg | Freq: Two times a day (BID) | INTRAVENOUS | Status: DC

## 2020-06-06 MED ORDER — MIDAZOLAM 50MG/50ML (1MG/ML) PREMIX INFUSION
0.5000 mg/h | INTRAVENOUS | Status: DC
  Administered 2020-06-06: 0.5 mg/h via INTRAVENOUS
  Administered 2020-06-07: 7 mg/h via INTRAVENOUS
  Administered 2020-06-07: 2.5 mg/h via INTRAVENOUS
  Administered 2020-06-07: 7 mg/h via INTRAVENOUS
  Administered 2020-06-08: 8 mg/h via INTRAVENOUS
  Filled 2020-06-06 (×5): qty 50

## 2020-06-06 MED ORDER — IPRATROPIUM-ALBUTEROL 0.5-2.5 (3) MG/3ML IN SOLN
3.0000 mL | Freq: Once | RESPIRATORY_TRACT | Status: AC
  Administered 2020-06-06: 3 mL via RESPIRATORY_TRACT

## 2020-06-06 MED ORDER — MIDAZOLAM HCL 2 MG/2ML IJ SOLN
2.0000 mg | Freq: Once | INTRAMUSCULAR | Status: AC
  Administered 2020-06-06: 2 mg via INTRAVENOUS

## 2020-06-06 MED ORDER — NOREPINEPHRINE 4 MG/250ML-% IV SOLN
2.0000 ug/min | INTRAVENOUS | Status: DC
  Administered 2020-06-06 – 2020-06-07 (×2): 2 ug/min via INTRAVENOUS
  Filled 2020-06-06 (×3): qty 250

## 2020-06-06 MED ORDER — SODIUM CHLORIDE 0.9 % IV SOLN: INTRAVENOUS | Status: DC

## 2020-06-06 MED ORDER — SODIUM CHLORIDE 0.9 % IV SOLN
500.0000 mg | INTRAVENOUS | Status: DC
  Administered 2020-06-07: 500 mg via INTRAVENOUS
  Filled 2020-06-06 (×3): qty 500

## 2020-06-06 MED ORDER — POLYETHYLENE GLYCOL 3350 17 G PO PACK: 17.0000 g | PACK | Freq: Every day | ORAL | Status: DC | PRN

## 2020-06-06 MED ORDER — FENTANYL BOLUS VIA INFUSION
25.0000 ug | INTRAVENOUS | Status: DC | PRN
  Administered 2020-06-06: 25 ug via INTRAVENOUS
  Filled 2020-06-06: qty 25

## 2020-06-06 MED ORDER — HEPARIN SODIUM (PORCINE) 5000 UNIT/ML IJ SOLN
5000.0000 [IU] | Freq: Three times a day (TID) | INTRAMUSCULAR | Status: DC
  Administered 2020-06-06 – 2020-06-07 (×2): 5000 [IU] via SUBCUTANEOUS
  Filled 2020-06-06 (×2): qty 1

## 2020-06-06 MED ORDER — IPRATROPIUM-ALBUTEROL 0.5-2.5 (3) MG/3ML IN SOLN
3.0000 mL | Freq: Four times a day (QID) | RESPIRATORY_TRACT | Status: DC
  Administered 2020-06-07 – 2020-06-22 (×61): 3 mL via RESPIRATORY_TRACT
  Filled 2020-06-06 (×56): qty 3

## 2020-06-06 MED ORDER — ACETAMINOPHEN 325 MG PO TABS: 650.0000 mg | ORAL_TABLET | ORAL | Status: DC | PRN

## 2020-06-06 MED ORDER — MIDAZOLAM HCL 2 MG/2ML IJ SOLN
1.0000 mg | INTRAMUSCULAR | Status: AC | PRN
  Administered 2020-06-07 (×3): 1 mg via INTRAVENOUS

## 2020-06-06 MED ORDER — SODIUM CHLORIDE 0.9 % IV SOLN: 250.0000 mL | INTRAVENOUS | Status: DC | PRN

## 2020-06-06 MED ORDER — BUDESONIDE 0.5 MG/2ML IN SUSP
0.5000 mg | Freq: Two times a day (BID) | RESPIRATORY_TRACT | Status: DC
  Administered 2020-06-07 – 2020-06-12 (×11): 0.5 mg via RESPIRATORY_TRACT
  Filled 2020-06-06 (×11): qty 2

## 2020-06-06 MED ORDER — ENOXAPARIN SODIUM 40 MG/0.4ML ~~LOC~~ SOLN: 40.0000 mg | SUBCUTANEOUS | Status: DC

## 2020-06-06 MED ORDER — SODIUM CHLORIDE 0.9 % IV SOLN
500.0000 mg | Freq: Once | INTRAVENOUS | Status: AC
  Administered 2020-06-06: 500 mg via INTRAVENOUS
  Filled 2020-06-06: qty 500

## 2020-06-06 MED ORDER — METHYLPREDNISOLONE SODIUM SUCC 40 MG IJ SOLR
40.0000 mg | Freq: Two times a day (BID) | INTRAMUSCULAR | Status: DC
  Administered 2020-06-06 – 2020-06-12 (×12): 40 mg via INTRAVENOUS
  Filled 2020-06-06 (×12): qty 1

## 2020-06-06 NOTE — ED Notes (Signed)
Pt provided warm blankets for temp. Pt versed increased due to pt opening eyes. EKG done per MD order. Family at bedside.

## 2020-06-06 NOTE — ED Notes (Signed)
Peyton Najjar, RT at bedside to place BIPAP on patient

## 2020-06-06 NOTE — Progress Notes (Signed)
Spoke with son Keelan Pomerleau regarding PICC order.  Reviewed risk and benefits of PICC placement.  States he is reluctant and  unsure if he wishes to proceed with PICC placement.  Wants to wait until tomorrow before deciding.  Patient has two PIV at present.

## 2020-06-06 NOTE — ED Notes (Signed)
EDP at bedside to reassess after BIPAP and breathing tx.

## 2020-06-06 NOTE — ED Triage Notes (Addendum)
BIB [facility] from home due to low oxygen and SOB. Pt was found to be in 80's on her 3.5L [place] she wears chronically. Used her home breathing tx without relief. Pt given 1 duoneb with EMS and 125 solumedrol PIV to L AC started by EMS. Arrives 100% on 4L [place]. BP systolic in 70's. Pt appears very sleepy, falling over in stretcher. Pt alert and oriented to self and place, when awakened and stimulated; pt with eyes closed and no speech when not being stimulated. Pt discharged from hospital yesterday.

## 2020-06-06 NOTE — ED Notes (Signed)
Medication Reconciliation Report  For Home History Technicians  HIGHLIGHTS:  1. The patient WAS NOT personally interviewed 2. If not, what was the main source used: PHARMACY RECORDS 3. Does the patient appear to take any anti-coagulation agents (e.g. warfarin, Eliquis or Xarelto): YES 4. Does the patient appear to take any anti-convulsant agents (e.g. divalproex, levetiracetam or phenytoin): NO 5. Does the patient appear to use any insulin products (e.g. Lantus, Novolin or Humalog): NO 6. Does the patient appear to take any "beta-blockers" (e.g. metoprolol, carvedilol or bisoprolol: YES  BARRIERS:  1. Were there any barriers that prevented or complicated the medication reconciliation process: YES 2. If yes, what was the primary barrier encountered: Intubation 3. Does the patient appear compliant with prescribed medications: YES 4. Does the patient express any barriers with compliance: UNABLE TO DETERMINE 5. What is the primary barrier the patient reports: None   NOTES:[Include any concerns, remarks or complaints the patient expresses regarding medication therapy. Any observations or other information that might be useful to the treatment team can also be included. Immediate needs or concerns should be referred to the RN or appropriate member of the treatment team.]  Patient currently intubated and unable to participate in home medication interview. Patient discharged [facility] 06/04/2020 with no significant changes in medication noted. Unable to ascertain last doses taken.  Elmo Putt, CPhT [place] at [facility] [address] [place], [place] [address] [phone]  ** The above is intended solely for informational and/or communicative purposes. It should in no way be considered an endorsement of any specific treatment, therapy or action. **

## 2020-06-06 NOTE — ED Notes (Signed)
Intubated by Dr. Erma Heritage. Peyton Najjar, RT at bedside. 7.5, 20 at lip. Positive color change.

## 2020-06-06 NOTE — ED Provider Notes (Signed)
[facility] Emergency Department Provider Note  ____________________________________________   Event Date/Time   First MD Initiated Contact with Patient 06/06/20 1156     (approximate)  I have reviewed the triage vital signs and the nursing notes.   HISTORY  Chief Complaint Shortness of Breath    HPI Virginia [name] is a 69 y.o. female  With h/o severe COPD, AFib, anemia, here with cough, SOB. Pt was just hospitalized from 12/10 to 12/12 for COPD. Was discharged on prednisone. Per report, Virginia [name] has had progressively worsening SOB, cough since then, with increasing wheezing. EMS called out 2/2 pt being drowsy today. On arrival, pt drowsy, slurred speech. Virginia [name] endorses SOB but is unable to provide much additional history. Denies CP.  Level 5 caveat invoked as remainder of history, ROS, and physical exam limited due to patient's confusion/acuity.         Past Medical History:  Diagnosis Date  . Anemia   . Anxiety   . Arthritis   . Atrial fibrillation ([place])   . C. difficile colitis 09/2015  . COPD (chronic obstructive pulmonary disease) ([place])   . Depression   . Dyspnea   . Dysrhythmia   . Fibromyalgia   . H/O tracheostomy   . Hep C w/ coma, chronic   . Hypertension   . MRSA pneumonia ([place]) 2017  . On home oxygen therapy    3 L/M   . Osteoporosis   . Peripheral neuropathy   . RLS (restless legs syndrome)   . S/P percutaneous endoscopic gastrostomy (PEG) tube placement ([place]) 09/2015  . Ventilator associated pneumonia [facility]) 10/2015   [facility] [place]    Patient Active Problem List   Diagnosis Date Noted  . Respiratory failure ([place]) 06/06/2020  . Acute respiratory failure with hypoxia ([place]) 06/02/2020  . COPD with acute exacerbation ([place]) 06/02/2020  . Shortness of breath 06/02/2020  . GERD (gastroesophageal reflux disease) 06/02/2020  . Iron deficiency anemia 01/22/2020  . Anemia 01/17/2019  . Coronary artery disease involving  native coronary artery of native heart 04/30/2018  . Palpitations 04/30/2018  . Chronic heartburn 04/12/2018  . Other dysphagia 04/12/2018  . Thoracic aortic aneurysm without rupture ([place]) 08/27/2017  . 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 SOBOE (shortness of breath on exertion) 01/24/2017  . Paroxysmal A-fib ([place]) 11/09/2016  . Chronic hypoxemic respiratory failure ([place]) 12/06/2015  . Depression with anxiety 12/06/2015  . Fibromyalgia 12/06/2015  . Hep C w/o coma, chronic ([place]) 12/06/2015  . History of tracheostomy 12/06/2015  . HTN, goal below 140/80 12/06/2015  . Localized osteoporosis without current pathological fracture 12/06/2015  . PEG (percutaneous endoscopic gastrostomy) status ([place]) 12/06/2015  . Peripheral neuropathy, idiopathic 12/06/2015  . RLS (restless legs syndrome) 12/06/2015  . 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 COPD (chronic obstructive pulmonary disease) ([place]) 11/29/2015    Past Surgical History:  Procedure Laterality Date  . ABDOMINAL HYSTERECTOMY    . BREAST SURGERY Bilateral    Breast Implants  . DILATION AND CURETTAGE OF UTERUS    . REVERSE SHOULDER ARTHROPLASTY Right 03/06/2017   Procedure: REVERSE SHOULDER ARTHROPLASTY;  Surgeon: Corky Mull, MD;  Location: [facility] ORS;  Service: Orthopedics;  Laterality: Right;  . ROTATOR CUFF REPAIR Bilateral     Prior to Admission medications   Medication Sig Start Date End Date Taking? Authorizing Provider  albuterol (PROVENTIL) (2.5 MG/3ML) 0.083% nebulizer solution Take 2.5 mg by nebulization. 02/09/19   [provider]  amitriptyline (ELAVIL) 25 MG tablet Take 25 mg by mouth at bedtime.  01/14/19   [provider]  apixaban (ELIQUIS) 5 MG TABS tablet Take 1 tablet (5 mg total) by mouth 2 (two) times daily. 11/10/16   Bettey Costa, MD  diazepam (VALIUM) 5 MG tablet Take 5 mg by mouth every 8 (eight) hours as needed for anxiety.  10/22/16   [provider]  diltiazem (CARDIZEM CD) 120 MG 24 hr capsule Take 120 mg by mouth daily. 04/29/19   [provider]  estradiol (ESTRACE) 1 MG tablet Take 1 mg by mouth daily. 09/10/16   [provider]  furosemide (LASIX) 20 MG tablet Take 20 mg by mouth daily.  10/01/16   [provider]  gabapentin (NEURONTIN) 300 MG capsule Take 300 mg by mouth 3 (three) times daily. 05/06/20   [provider]  levofloxacin (LEVAQUIN) 500 MG tablet Take 1 tablet (500 mg total) by mouth daily for 10 days. 06/04/20 06/14/20  Wouk, Ailene Rud, MD  lisinopril (ZESTRIL) 10 MG tablet Take 10 mg by mouth daily.    [provider]  MAGNESIUM-OXIDE 400 (241.3 Mg) MG tablet Take 400 mg by mouth daily.  08/24/16   [provider]  metoprolol tartrate (LOPRESSOR) 50 MG tablet Take 50 mg by mouth 2 (two) times daily. 04/23/20   [provider]  Multiple Vitamin (MULTIVITAMIN) tablet Take 1 tablet by mouth daily.    [provider]  omeprazole (PRILOSEC) 40 MG capsule Take 40 mg by mouth daily. 04/16/19   [provider]  oxyCODONE-acetaminophen (PERCOCET) 10-325 MG tablet Take 1 tablet by mouth 3 (three) times daily. 05/03/20   [provider]  predniSONE (DELTASONE) 10 MG tablet Take 4 tablets (40 mg total) by mouth daily. 06/04/20   Wouk, Ailene Rud, MD  predniSONE (DELTASONE) 5 MG tablet Take 5 mg by mouth daily. 05/23/19   [provider]  PROAIR HFA 108 (90 Base) MCG/ACT inhaler Inhale 2 puffs into the lungs every 6 (six) hours as needed for wheezing or shortness of breath.  10/23/16   [provider]  QUEtiapine (SEROQUEL) 25 MG tablet Take 25 mg by mouth at bedtime.  10/01/16   [provider]    Allergies Patient has no known allergies.  Family History  Problem Relation Age of Onset  . Hypertension Mother     Social History Social History   Tobacco Use  . Smoking status: Former Smoker     Packs/day: 1.50    Types: Cigarettes    Quit date: 10/05/2015    Years since quitting: 4.6  . Smokeless tobacco: Never Used  Vaping Use  . Vaping Use: Some days  . Last attempt to quit: 10/05/2015  Substance Use Topics  . Alcohol use: No  . Drug use: No    Review of Systems  Review of Systems  Unable to perform ROS: Mental status change  Respiratory: Positive for cough, shortness of breath and wheezing.      ____________________________________________  PHYSICAL EXAM:      VITAL SIGNS: ED Triage Vitals  Enc Vitals Group     BP      Pulse      Resp      Temp      Temp src      SpO2      Weight      Height      Head Circumference      Peak Flow  Pain Score      Pain Loc      Pain Edu?      Excl. in [place]?      Physical Exam Vitals and nursing note reviewed.  Constitutional:      General: Virginia [name] is not in acute distress.    Appearance: Virginia [name] is well-developed. Virginia [name] is ill-appearing.  HENT:     Head: Normocephalic and atraumatic.  Eyes:     Conjunctiva/sclera: Conjunctivae normal.  Cardiovascular:     Rate and Rhythm: Normal rate and regular rhythm.     Heart sounds: Normal heart sounds. No murmur heard. No friction rub.  Pulmonary:     Effort: Pulmonary effort is normal. Tachypnea present. No respiratory distress.     Breath sounds: Examination of the right-upper field reveals wheezing. Examination of the left-upper field reveals wheezing. Examination of the right-middle field reveals wheezing. Examination of the left-middle field reveals wheezing. Examination of the right-lower field reveals wheezing. Examination of the left-lower field reveals wheezing. Decreased breath sounds and wheezing present. No rales.  Abdominal:     General: There is no distension.     Palpations: Abdomen is soft.     Tenderness: There is no abdominal tenderness.  Musculoskeletal:     Cervical back: Neck supple.  Skin:    General: Skin is warm.     Capillary Refill: Capillary refill  takes less than 2 seconds.  Neurological:     Mental Status: Virginia [name] is alert. Virginia [name] is disoriented and confused.     Motor: No abnormal muscle tone.       ____________________________________________   LABS (all labs ordered are listed, but only abnormal results are displayed)  Labs Reviewed  BLOOD GAS, VENOUS - Abnormal; Notable for the following components:      Result Value   pH, Ven 7.08 (*)    pCO2, Ven >120.0 (*)    Bicarbonate 40.9 (*)    Acid-Base Excess 7.5 (*)    All other components within normal limits  CBC WITH DIFFERENTIAL/PLATELET - Abnormal; Notable for the following components:   WBC 13.9 (*)    RBC 3.52 (*)    Hemoglobin 10.7 (*)    HCT 35.0 (*)    Neutro Abs 12.8 (*)    Lymphs Abs 0.5 (*)    Abs Immature Granulocytes 0.13 (*)    All other components within normal limits  COMPREHENSIVE METABOLIC PANEL - Abnormal; Notable for the following components:   Sodium 121 (*)    Chloride 76 (*)    CO2 34 (*)    Glucose, Bld 163 (*)    BUN 42 (*)    Creatinine, Ser 2.78 (*)    Calcium 8.5 (*)    AST 48 (*)    ALT 46 (*)    Alkaline Phosphatase 17 (*)    GFR, Estimated 18 (*)    All other components within normal limits  BLOOD GAS, ARTERIAL - Abnormal; Notable for the following components:   pH, Arterial 7.22 (*)    pCO2 arterial 75 (*)    pO2, Arterial 123 (*)    Bicarbonate 31.7 (*)    Acid-Base Excess 2.2 (*)    All other components within normal limits  RESP PANEL BY RT-PCR (FLU A&B, COVID) ARPGX2  CULTURE, BLOOD (ROUTINE X 2)  CULTURE, BLOOD (ROUTINE X 2)  LACTIC ACID, PLASMA  PROCALCITONIN  CBC  BASIC METABOLIC PANEL  BLOOD GAS, ARTERIAL  TROPONIN I (HIGH SENSITIVITY)  TROPONIN I (HIGH SENSITIVITY)  ____________________________________________  EKG: Normal sinus rhythm, ventricular rate 68.  PR 170, QRS 73, QTc 422.  No acute ST elevations or depressions.  No EKG evidence of acute ischemia or  infarct. ________________________________________  RADIOLOGY All imaging, including plain films, CT scans, and ultrasounds, independently reviewed by me, and interpretations confirmed via formal radiology reads.  ED MD interpretation:   CXR: Clear, ET tube in position XR Abd: OG tube in stomach  Official radiology report(s): DG Chest Portable 1 View  Result Date: 06/06/2020 CLINICAL DATA:  Shortness of breath. Low O2 sats. Post intubation and NG tube placement. EXAM: PORTABLE CHEST 1 VIEW COMPARISON:  06/02/2020. FINDINGS: Endotracheal tube noted with tip 2 cm above the carina. NG tube noted with tip below left hemidiaphragm. Heart size normal. No focal infiltrate. No pleural effusion or pneumothorax. Small bilateral pleural effusions. Total right shoulder replacement. IMPRESSION: 1. Endotracheal tube and NG tube in good anatomic position. 2. No acute cardiopulmonary disease identified. Electronically Signed   By: Marcello Moores  Register   On: 06/06/2020 13:19   DG Abd Portable 1 View  Result Date: 06/06/2020 CLINICAL DATA:  Orogastric tube placement EXAM: PORTABLE ABDOMEN - 1 VIEW COMPARISON:  None. FINDINGS: Orogastric tube tip and side port in stomach. Visualized bowel unremarkable without dilatation or air-fluid level to suggest bowel obstruction. No free air. Visualized lung bases clear. IMPRESSION: Orogastric tube tip and side port in stomach. Visualized bowel unremarkable. No free air appreciable on supine examination. Electronically Signed   By: Lowella Grip III M.D.   On: 06/06/2020 13:16   [place] EKG SITE RITE  Result Date: 06/06/2020 If Site Rite image not attached, placement could not be confirmed due to current cardiac rhythm.   ____________________________________________  PROCEDURES   Procedure(s) performed (including Critical Care):  .Critical Care Performed by: Duffy Bruce, MD Authorized by: Duffy Bruce, MD   Critical care provider statement:    Critical care  time (minutes):  64   Critical care time was exclusive of:  Separately billable procedures and treating other patients and teaching time   Critical care was necessary to treat or prevent imminent or life-threatening deterioration of the following conditions:  Cardiac failure, circulatory failure and respiratory failure   Critical care was time spent personally by me on the following activities:  Development of treatment plan with patient or surrogate, discussions with consultants, evaluation of patient's response to treatment, examination of patient, obtaining history from patient or surrogate, ordering and performing treatments and interventions, ordering and review of laboratory studies, ordering and review of radiographic studies, pulse oximetry, re-evaluation of patient's condition and review of old charts   I assumed direction of critical care for this patient from another provider in my specialty: no   Procedure Name: Intubation Date/Time: 06/06/2020 12:54 PM Performed by: Duffy Bruce, MD Pre-anesthesia Checklist: Patient identified, Patient being monitored, Emergency Drugs available, Timeout performed and Suction available Oxygen Delivery Method: Non-rebreather mask Preoxygenation: Pre-oxygenation with 100% oxygen Induction Type: Rapid sequence Ventilation: Mask ventilation without difficulty Laryngoscope Size: Mac and 3 Grade View: Grade I Tube size: 7.5 mm Number of attempts: 1 Airway Equipment and Method: Video-laryngoscopy and Rigid stylet Placement Confirmation: ETT inserted through vocal cords under direct vision,  CO2 detector and Breath sounds checked- equal and bilateral Secured at: 21 cm Tube secured with: ETT holder Dental Injury: Teeth and Oropharynx as per pre-operative assessment  Difficulty Due To: Difficulty was unanticipated Future Recommendations: Recommend- induction with short-acting agent, and alternative techniques readily available    .1-3  Lead EKG  Interpretation Performed by: Duffy Bruce, MD Authorized by: Duffy Bruce, MD     Interpretation: normal     ECG rate:  80-100   ECG rate assessment: normal     Rhythm: sinus rhythm     Ectopy: none     Conduction: normal   Comments:     Indication: Respiratory failure Ultrasound ED Peripheral IV (Provider)  Date/Time: 06/06/2020 2:04 PM Performed by: Duffy Bruce, MD Authorized by: Duffy Bruce, MD   Procedure details:    Indications: multiple failed IV attempts     Skin Prep: chlorhexidine gluconate     Location:  Left AC   Angiocath:  18 G   Bedside Ultrasound Guided: Yes     Images: not archived     Patient tolerated procedure without complications: Yes     Dressing applied: Yes      ____________________________________________  INITIAL IMPRESSION / MDM / [place] / ED COURSE  As part of my medical decision making, I reviewed the following data within the [place] notes reviewed and incorporated, Old chart reviewed, Notes from prior ED visits, and [place] Controlled Substance Database       *SHAMETRA CUMBERLAND was evaluated in Emergency Department on 06/06/2020 for the symptoms described in the history of present illness. Virginia [name] was evaluated in the context of the global COVID-19 pandemic, which necessitated consideration that the patient might be at risk for infection with the SARS-CoV-2 virus that causes COVID-19. Institutional protocols and algorithms that pertain to the evaluation of patients at risk for COVID-19 are in a state of rapid change based on information released by regulatory bodies including the CDC and federal and state organizations. These policies and algorithms were followed during the patient's care in the ED.  Some ED evaluations and interventions may be delayed as a result of limited staffing during the pandemic.*     Medical Decision Making: 69 year old female with history of severe COPD status post recent  admission here with altered mental status and shortness of breath.  Suspect acute hypercapnic and hypoxic respiratory failure in the setting of ongoing COPD exacerbation, respiratory fatigue, as well as possible component of opioid narcosis given recent hydrocodone prescription.  Patient was initially placed on BiPAP, given Narcan, and duo nebs X.3 as well as IV magnesium, with no improvement in mental status.  Discussed with her family, who confirmed that Virginia [name] is full code.  Patient subsequently intubated.  Blood gas prior to intubation revealed profound acidosis with pH of 7.08 and PCO2 of greater than 120.  Pt with significant improvement in EtCO2 after intubation. Repeat gas is pending. Tele shows no arrhythmia. BP improving as well. Pt did have significant difficulty with sedation, so fentanyl and versed gtt running. Left AC line placed by myself. Admit to ICU.   Of note, given hypotension and leukocytosis, will cover empirically for possible CAP. IVF given. Peripheral levophed for MAP support. Suspect this is combination of volume depletion and increased intrathoracic pressure related to her COPD and intubation.  ____________________________________________  FINAL CLINICAL IMPRESSION(S) / ED DIAGNOSES  Final diagnoses:  Acute respiratory failure with hypoxia and hypercapnia (HCC)  COPD exacerbation (HCC)     MEDICATIONS GIVEN DURING THIS VISIT:  Medications  fentaNYL 2513mg in NS 2544m(1070mml) infusion-PREMIX (125 mcg/hr Intravenous Rate/Dose Change 06/06/20 1430)  fentaNYL (SUBLIMAZE) bolus via infusion 25 mcg (25 mcg Intravenous Bolus from Bag 06/06/20 1430)  0.9 %  sodium chloride infusion (has no administration  in time range)  norepinephrine (LEVOPHED) 20m in 2568mpremix infusion (7 mcg/min Intravenous Rate/Dose Change 06/06/20 1452)  midazolam (VERSED) 50 mg/50 mL (1 mg/mL) premix infusion (2 mg/hr Intravenous Rate/Dose Change 06/06/20 1428)  sodium chloride flush (NS) 0.9 %  injection 3 mL (3 mLs Intravenous Given 06/06/20 1605)  sodium chloride flush (NS) 0.9 % injection 3 mL (has no administration in time range)  0.9 %  sodium chloride infusion (has no administration in time range)  acetaminophen (TYLENOL) tablet 650 mg (has no administration in time range)  docusate sodium (COLACE) capsule 100 mg (has no administration in time range)  polyethylene glycol (MIRALAX / GLYCOLAX) packet 17 g (has no administration in time range)  ondansetron (ZOFRAN) injection 4 mg (has no administration in time range)  famotidine (PEPCID) IVPB 20 mg premix (has no administration in time range)  docusate (COLACE) 50 MG/5ML liquid 100 mg (has no administration in time range)  polyethylene glycol (MIRALAX / GLYCOLAX) packet 17 g (has no administration in time range)  midazolam (VERSED) injection 1 mg (has no administration in time range)  midazolam (VERSED) injection 1 mg (has no administration in time range)  fentaNYL (SUBLIMAZE) injection 25 mcg (has no administration in time range)  fentaNYL (SUBLIMAZE) bolus via infusion 25 mcg (has no administration in time range)  heparin injection 5,000 Units (has no administration in time range)  ipratropium-albuterol (DUONEB) 0.5-2.5 (3) MG/3ML nebulizer solution 3 mL (3 mLs Nebulization Given 06/06/20 1215)  ipratropium-albuterol (DUONEB) 0.5-2.5 (3) MG/3ML nebulizer solution 3 mL (3 mLs Nebulization Given 06/06/20 1210)  sodium chloride 0.9 % bolus 1,000 mL (0 mLs Intravenous Stopped 06/06/20 1333)  sodium chloride 0.9 % bolus 1,000 mL (0 mLs Intravenous Stopped 06/06/20 1333)  magnesium sulfate IVPB 2 g 50 mL (0 g Intravenous Stopped 06/06/20 1231)  etomidate (AMIDATE) injection 15 mg (15 mg Intravenous Given 06/06/20 1248)  succinylcholine (ANECTINE) injection 80 mg (80 mg Intravenous Given 06/06/20 1248)  fentaNYL (SUBLIMAZE) injection 50 mcg (50 mcg Intravenous Given 06/06/20 1307)  naloxone (NARCAN) injection 1 mg (1 mg Intravenous  Given 06/06/20 1242)  HYDROmorphone (DILAUDID) injection 1 mg (1 mg Intravenous Given 06/06/20 1322)  fentaNYL (SUBLIMAZE) injection 100 mcg (100 mcg Intravenous Given 06/06/20 1315)  midazolam (VERSED) injection 2 mg (2 mg Intravenous Given 06/06/20 1315)  midazolam (VERSED) injection 4 mg (4 mg Intravenous Given 06/06/20 1326)  fentaNYL (SUBLIMAZE) injection 100 mcg (100 mcg Intravenous Bolus 06/06/20 1327)  fentaNYL (SUBLIMAZE) injection 100 mcg (100 mcg Intravenous Bolus 06/06/20 1300)  midazolam (VERSED) injection 2 mg (2 mg Intravenous Given 06/06/20 1330)  cefTRIAXone (ROCEPHIN) 2 g in sodium chloride 0.9 % 100 mL IVPB (0 g Intravenous Stopped 06/06/20 1514)  azithromycin (ZITHROMAX) 500 mg in sodium chloride 0.9 % 250 mL IVPB (500 mg Intravenous New Bag/Given 06/06/20 1604)  sodium chloride 0.9 % bolus 500 mL (0 mLs Intravenous Stopped 06/06/20 1514)     ED Discharge Orders    None       Note:  This document was prepared using Dragon voice recognition software and may include unintentional dictation errors.   IsDuffy BruceMD 06/06/20 1705

## 2020-06-06 NOTE — H&P (Signed)
NAME:  Virginia [name], MRN:  [id number], DOB:  [birth year], LOS: 0 ADMISSION DATE:  06/06/2020, CONSULTATION DATE: 06/06/2020 REFERRING MD: Dr. Ellender Hose, CHIEF COMPLAINT: Shortness of Breath   Brief History   69 yo female with severe COPD admitted with acute on chronic hypoxic hypercapnic respiratory failure and acute encephalopathy secondary to narcotic use and AECOPD requiring mechanical intubation   History of present illness   This is a 68 yo female who was recently hospitalized from 12/10 to 12/12 following treatment of AECOPD.  She presented back to [facility] ER on 12/14 via EMS with altered mental status, cough, worsening shortness of breath, and wheezing. Pts daughter reports she has been taking narcotics along with seroquel. ER lab results revealed Na+ 121, chloride 76, CO2 34, glucose 163, BUN 42, creatinine 2.78, AST 48, ALT 46, pct <0.10, wbc 13.9, hgb 10.7, and ABG pH 7.08/pCO2 >120/bicarb 40.9.  Pt received narcan, duonebs x3, iv magnesium, and placed on Bipap.  However, due to lack of improvement of mentation she required mechanical intubation.  COVID-19/Influenza PCR and CXR negative.  She was subsequently admitted to ICU for additional workup and treatment.  Past Medical History  Ventilator Associated Pneumonia Restless Leg Syndrome Peripheral Neuropathy  Osteoporosis Home O2 _0  MRSA Pneumonia HTN Hepatitis C Tracheostomy-reversed  Fibromyalgia Dysrhythmia Dyspnea Depression COPD C. Difficle Colitis  Atrial Fibrillation Arthritis Anxiety Anemia   Significant Hospital Events   12/14: Pt admitted to ICU mechanically intubated   Consults:  Intensivist   Procedures:  None   Significant Diagnostic Tests:  CXR 12/14: negative for acute cardiopulmonary disease   Micro Data:  COVID-19/Influenza PCR 12/14>>negative   Antimicrobials:  Azithromycin 12/14>>  Interim history/subjective:  Pt sedated and mechanically intubated, opens eyes when stimulated    Objective   Blood pressure 107/73, pulse 62, temperature (!) 97.4 F (36.3 C), resp. rate 18, height _1  (1.575 m), weight 58.9 kg, SpO2 100 %.    Vent Mode: AC FiO2 (%):  [36 %] 36 % Set Rate:  [18 bmp] 18 bmp Vt Set:  [420 mL] 420 mL PEEP:  [5 cmH20] 5 cmH20   Intake/Output Summary (Last 24 hours) at 06/06/2020 1917 Last data filed at 06/06/2020 1800 Gross per 24 hour  Intake --  Output 780 ml  Net -780 ml   Filed Weights   06/06/20 1205  Weight: 58.9 kg    Examination: General: acutely ill appearing female, NAD mechanically intubated  HENT: supple, no JVD Lungs: rhonchi throughout, even, non labored  Cardiovascular: nsr, rrr, no R/G, 2+ radial/1+ distal pulses, 1+ bilateral lower extremity edema  Abdomen: +BS x4, obese, soft, non tender, non distended Extremities: normal bulk and tone, no edema  Neuro: sedated, not following commands, opens eyes when stimulated, PERRL GU: foley in place dark yellow urine draining   Assessment & Plan:   Acute on chronic hypoxic hypercapnic respiratory failure secondary to opioid use and AECOPD  Hx: Chronic O2 _2  and Dyspnea  Full vent support for now-vent settings reviewed and established SBT's once all parameters met VAP bundle implemented  Scheduled and prn bronchodilator therapy  IV and nebulized steroids  Continue iv azithromycin-stop date 12/19  HTN-stable Hx: Atrial fibrillation Continuous telemetry monitoring  Hold outpatient antihypertensives for now bp soft secondary to sedating medications  Echo pending   Acute renal failure and hyponatremia likely secondary to dehydration  Trend BMP  Replace electrolytes as indicated  Monitor UOP Avoid nephrotoxic medications  NS _3  ml/hr   Mildly elevated liver  enzymes  Hx: Hepatitis C  Trend hepatic panels   Anemia without obvious acute blood loss Trend CBC Monitor for s/sx of bleeding and transfuse for hgb <7  Hyperglycemia  CBG's q4hrs SSI   Acute  encephalopathy secondary to hypercapnia and narcotic use  Mechanical Intubation pain/discomfort  Hx: Fibromyalgia, depression, and anxiety  Maintain RASS goal -1 to -2 Versed and fentanyl gtts to maintain RASS goal and for pain management  WUA daily  Hold outpatient narcotics for now   Best practice (evaluated daily)   Diet: NPO for now  Pain/Anxiety/Delirium protocol (if indicated): Versed and fentanyl gtts to maintain RASS goal  VAP protocol (if indicated): ordered  DVT prophylaxis: subq heparin  GI prophylaxis: IV protonix  Glucose control: SSI  Mobility: Bedrest  last date of multidisciplinary goals of care discussion N/A Family: Spoke with pts daughter Venida Jarvis at bedside  Summary of discussion: plan of care  Follow up goals of care discussion due 12/15 Code Status: Full Code  Disposition: ICU   Labs   CBC: Recent Labs  Lab 06/02/20 1258 06/03/20 0434 06/06/20 1207  WBC 6.9 4.5 13.9*  NEUTROABS  --  4.2 12.8*  HGB 10.0* 9.6* 10.7*  HCT 32.5* 30.5* 35.0*  MCV 100.6* 98.4 99.4  PLT 217 213 062    Basic Metabolic Panel: Recent Labs  Lab 06/02/20 1258 06/03/20 0434 06/04/20 0326 06/06/20 1207  NA 137 135 136 121*  K 3.7 4.6 4.0 4.2  CL 85* 87* 89* 76*  CO2 41* 37* 39* 34*  GLUCOSE 87 121* 141* 163*  BUN _0 42*  CREATININE 0.76 0.75 1.04* 2.78*  CALCIUM 9.2 8.8* 8.9 8.5*  MG 2.0 2.4  --   --   PHOS  --  3.0  --   --    GFR: Estimated Creatinine Clearance: 15.1 mL/min (A) (by C-G formula based on SCr of 2.78 mg/dL (H)). Recent Labs  Lab 06/02/20 1258 06/03/20 0434 06/06/20 1207  PROCALCITON  --   --  <0.10  WBC 6.9 4.5 13.9*  LATICACIDVEN  --   --  1.4    Liver Function Tests: Recent Labs  Lab 06/03/20 0434 06/06/20 1207  AST 26 48*  ALT 21 46*  ALKPHOS 14* 17*  BILITOT 0.7 0.6  PROT 6.3* 6.9  ALBUMIN 3.4* 3.9   No results for input(s): LIPASE, AMYLASE in the last 168 hours. No results for input(s): AMMONIA in the last 168  hours.  ABG    Component Value Date/Time   PHART 7.22 (L) 06/06/2020 1331   PCO2ART 75 (HH) 06/06/2020 1331   PO2ART 123 (H) 06/06/2020 1331   HCO3 31.7 (H) 06/06/2020 1331   O2SAT 98.4 06/06/2020 1331     Coagulation Profile: No results for input(s): INR, PROTIME in the last 168 hours.  Cardiac Enzymes: No results for input(s): CKTOTAL, CKMB, CKMBINDEX, TROPONINI in the last 168 hours.  HbA1C: No results found for: HGBA1C  CBG: No results for input(s): GLUCAP in the last 168 hours.  Review of Systems:   Unable to assess pt mechanically intubated   Past Medical History  She,  has a past medical history of Anemia, Anxiety, Arthritis, Atrial fibrillation ([place]), C. difficile colitis (09/2015), COPD (chronic obstructive pulmonary disease) ([place]), Depression, Dyspnea, Dysrhythmia, Fibromyalgia, H/O tracheostomy, Hep C w/ coma, chronic, Hypertension, MRSA pneumonia ([place]) (2017), On home oxygen therapy, Osteoporosis, Peripheral neuropathy, RLS (restless legs syndrome), S/P percutaneous endoscopic gastrostomy (PEG) tube placement ([place]) (09/2015), and Ventilator associated pneumonia ([place]) (  10/2015).   Surgical History    Past Surgical History:  Procedure Laterality Date  . ABDOMINAL HYSTERECTOMY    . BREAST SURGERY Bilateral    Breast Implants  . DILATION AND CURETTAGE OF UTERUS    . REVERSE SHOULDER ARTHROPLASTY Right 03/06/2017   Procedure: REVERSE SHOULDER ARTHROPLASTY;  Surgeon: Corky Mull, MD;  Location: [facility] ORS;  Service: Orthopedics;  Laterality: Right;  . ROTATOR CUFF REPAIR Bilateral      Social History   reports that she quit smoking about 4 years ago. Her smoking use included cigarettes. She smoked 1.50 packs per day. She has never used smokeless tobacco. She reports that she does not drink alcohol and does not use drugs.   Family History   Her family history includes Hypertension in her mother.   Allergies No Known Allergies   Home Medications  Prior to  Admission medications   Medication Sig Start Date End Date Taking? Authorizing Provider  albuterol (VENTOLIN HFA) 108 (90 Base) MCG/ACT inhaler Inhale 2 puffs into the lungs every 4 (four) hours as needed for wheezing or shortness of breath.   Yes [provider]  amitriptyline (ELAVIL) 25 MG tablet Take 25 mg by mouth at bedtime.  01/14/19  Yes [provider]  diazepam (VALIUM) 5 MG tablet Take 5 mg by mouth every 8 (eight) hours as needed for anxiety.  10/22/16  Yes [provider]  diltiazem (CARDIZEM CD) 120 MG 24 hr capsule Take 120 mg by mouth daily. 04/29/19  Yes [provider]  estradiol (ESTRACE) 1 MG tablet Take 1 mg by mouth daily. 09/10/16  Yes [provider]  furosemide (LASIX) 20 MG tablet Take 20 mg by mouth daily.  10/01/16  Yes [provider]  gabapentin (NEURONTIN) 300 MG capsule Take 300 mg by mouth 3 (three) times daily. 05/06/20  Yes [provider]  lisinopril (ZESTRIL) 10 MG tablet Take 10 mg by mouth daily.   Yes [provider]  MAGNESIUM-OXIDE 400 (241.3 Mg) MG tablet Take 400 mg by mouth daily.  08/24/16  Yes [provider]  metoprolol tartrate (LOPRESSOR) 50 MG tablet Take 50 mg by mouth 2 (two) times daily. 04/23/20  Yes [provider]  Multiple Vitamin (MULTIVITAMIN) tablet Take 1 tablet by mouth daily.   Yes [provider]  omeprazole (PRILOSEC) 40 MG capsule Take 40 mg by mouth daily. 04/16/19  Yes [provider]  oxyCODONE-acetaminophen (PERCOCET) 10-325 MG tablet Take 1 tablet by mouth 3 (three) times daily. 05/03/20  Yes [provider]  predniSONE (DELTASONE) 5 MG tablet Take 5 mg by mouth daily. 05/23/19  Yes [provider]  QUEtiapine (SEROQUEL) 25 MG tablet Take 25 mg by mouth at bedtime.  10/01/16  Yes [provider]  TRELEGY ELLIPTA 100-62.5-25 MCG/INH AEPB Inhale 1 puff into the lungs daily. 06/04/20  Yes [provider]  apixaban (ELIQUIS) 5 MG TABS tablet Take 1 tablet (5 mg total) by mouth 2 (two) times daily. 11/10/16   Bettey Costa, MD  levofloxacin (LEVAQUIN) 500 MG tablet Take 1 tablet (500 mg total) by mouth daily for 10 days. 06/04/20 06/14/20  Wouk, Ailene Rud, MD  predniSONE (DELTASONE) 10 MG tablet Take 4 tablets (40 mg total) by mouth daily. 06/04/20   Wouk, Ailene Rud, MD     Critical care time: 28 minutes      Marda Stalker, [place] [facility] Pager [phone] (please enter 7 digits) [place] Pager [phone] (please enter  7 digits)

## 2020-06-06 NOTE — ED Notes (Signed)
Phone placed in clean bag and at bedside.

## 2020-06-07 ENCOUNTER — Inpatient Hospital Stay
Admit: 2020-06-07 | Discharge: 2020-06-07 | Disposition: A | Discharge status: Home / Self Care | Payer: Medicare Other | Attending: Critical Care Medicine | Admitting: Critical Care Medicine

## 2020-06-07 DIAGNOSIS — J441 Chronic obstructive pulmonary disease with (acute) exacerbation: Secondary | ICD-10-CM

## 2020-06-07 LAB — BLOOD GAS, ARTERIAL
Acid-Base Excess: 5.6 mmol/L — ABNORMAL HIGH (ref 0.0–2.0)
Bicarbonate: 31.5 mmol/L — ABNORMAL HIGH (ref 20.0–28.0)
FIO2: 0.36
MECHVT: 420 mL
Mechanical Rate: 18
O2 Saturation: 97.6 %
PEEP: 5 cmH2O
Patient temperature: 37
pCO2 arterial: 52 mmHg — ABNORMAL HIGH (ref 32.0–48.0)
pH, Arterial: 7.39 (ref 7.350–7.450)
pO2, Arterial: 99 mmHg (ref 83.0–108.0)

## 2020-06-07 LAB — CBC
HCT: 28.5 % — ABNORMAL LOW (ref 36.0–46.0)
Hemoglobin: 9 g/dL — ABNORMAL LOW (ref 12.0–15.0)
MCH: 30.6 pg (ref 26.0–34.0)
MCHC: 31.6 g/dL (ref 30.0–36.0)
MCV: 96.9 fL (ref 80.0–100.0)
Platelets: 254 10*3/uL (ref 150–400)
RBC: 2.94 MIL/uL — ABNORMAL LOW (ref 3.87–5.11)
RDW: 13.2 % (ref 11.5–15.5)
WBC: 9.3 10*3/uL (ref 4.0–10.5)
nRBC: 0 % (ref 0.0–0.2)

## 2020-06-07 LAB — BASIC METABOLIC PANEL
Anion gap: 12 (ref 5–15)
BUN: 38 mg/dL — ABNORMAL HIGH (ref 8–23)
CO2: 28 mmol/L (ref 22–32)
Calcium: 7.8 mg/dL — ABNORMAL LOW (ref 8.9–10.3)
Chloride: 92 mmol/L — ABNORMAL LOW (ref 98–111)
Creatinine, Ser: 1.83 mg/dL — ABNORMAL HIGH (ref 0.44–1.00)
GFR, Estimated: 30 mL/min — ABNORMAL LOW (ref >60)
Glucose, Bld: 134 mg/dL — ABNORMAL HIGH (ref 70–99)
Potassium: 3.8 mmol/L (ref 3.5–5.1)
Sodium: 132 mmol/L — ABNORMAL LOW (ref 135–145)

## 2020-06-07 LAB — PHOSPHORUS: Phosphorus: 3 mg/dL (ref 2.5–4.6)

## 2020-06-07 LAB — GLUCOSE, CAPILLARY
Glucose-Capillary: 119 mg/dL — ABNORMAL HIGH (ref 70–99)
Glucose-Capillary: 132 mg/dL — ABNORMAL HIGH (ref 70–99)
Glucose-Capillary: 132 mg/dL — ABNORMAL HIGH (ref 70–99)
Glucose-Capillary: 133 mg/dL — ABNORMAL HIGH (ref 70–99)
Glucose-Capillary: 143 mg/dL — ABNORMAL HIGH (ref 70–99)
Glucose-Capillary: 161 mg/dL — ABNORMAL HIGH (ref 70–99)
Glucose-Capillary: 162 mg/dL — ABNORMAL HIGH (ref 70–99)

## 2020-06-07 LAB — ECHOCARDIOGRAM COMPLETE
Height: 62.008 in
S' Lateral: 1.69 cm
Weight: 2076.8 oz

## 2020-06-07 LAB — HEMOGLOBIN A1C
Hgb A1c MFr Bld: 5.3 % (ref 4.8–5.6)
Mean Plasma Glucose: 105.41 mg/dL

## 2020-06-07 LAB — MRSA PCR SCREENING: MRSA by PCR: NEGATIVE

## 2020-06-07 LAB — MAGNESIUM: Magnesium: 2.4 mg/dL (ref 1.7–2.4)

## 2020-06-07 MED ORDER — VITAL HIGH PROTEIN PO LIQD: 1000.0000 mL | ORAL | Status: DC

## 2020-06-07 MED ORDER — ORAL CARE MOUTH RINSE
15.0000 mL | OROMUCOSAL | Status: DC
  Administered 2020-06-07 – 2020-06-09 (×16): 15 mL via OROMUCOSAL

## 2020-06-07 MED ORDER — CHLORHEXIDINE GLUCONATE 0.12% ORAL RINSE (MEDLINE KIT)
15.0000 mL | Freq: Two times a day (BID) | OROMUCOSAL | Status: DC
  Administered 2020-06-07 – 2020-06-09 (×5): 15 mL via OROMUCOSAL

## 2020-06-07 MED ORDER — DEXMEDETOMIDINE HCL IN NACL 400 MCG/100ML IV SOLN
0.4000 ug/kg/h | INTRAVENOUS | Status: DC
  Administered 2020-06-07: 0.6 ug/kg/h via INTRAVENOUS
  Administered 2020-06-07 – 2020-06-08 (×2): 0.4 ug/kg/h via INTRAVENOUS
  Administered 2020-06-08: 1 ug/kg/h via INTRAVENOUS
  Filled 2020-06-07 (×4): qty 100

## 2020-06-07 MED ORDER — QUETIAPINE FUMARATE 25 MG PO TABS
25.0000 mg | ORAL_TABLET | Freq: Every day | ORAL | Status: DC
  Administered 2020-06-07: 25 mg
  Filled 2020-06-07: qty 1

## 2020-06-07 MED ORDER — GABAPENTIN 250 MG/5ML PO SOLN
300.0000 mg | Freq: Three times a day (TID) | ORAL | Status: DC
  Administered 2020-06-07 – 2020-06-08 (×3): 300 mg
  Filled 2020-06-07 (×8): qty 6

## 2020-06-07 MED ORDER — AMITRIPTYLINE HCL 25 MG PO TABS
25.0000 mg | ORAL_TABLET | Freq: Every day | ORAL | Status: DC
  Administered 2020-06-07: 25 mg
  Filled 2020-06-07 (×3): qty 1

## 2020-06-07 MED ORDER — APIXABAN 2.5 MG PO TABS
2.5000 mg | ORAL_TABLET | Freq: Two times a day (BID) | ORAL | Status: DC
  Administered 2020-06-07 – 2020-06-09 (×4): 2.5 mg
  Filled 2020-06-07 (×4): qty 1

## 2020-06-07 MED ORDER — VITAL AF 1.2 CAL PO LIQD
1000.0000 mL | ORAL | Status: DC
  Administered 2020-06-07: 1000 mL

## 2020-06-07 NOTE — Progress Notes (Signed)
Initial Nutrition Assessment  DOCUMENTATION CODES:   Not applicable  INTERVENTION:  Initiate Vital AF 1.2 Cal at 20 mL/hr and advance by 15 mL/hr every 8 hours to goal rate of 50 mL/hr (1200 mL goal daily volume) per tube. Provides 1440 kcal, 90 grams of protein, 972 mL H2O daily.  Monitor magnesium, potassium, and phosphorus daily for at least 3 days, MD to replete as needed, as pt is at risk for refeeding syndrome.  NUTRITION DIAGNOSIS:   Inadequate oral intake related to inability to eat as evidenced by NPO status.  GOAL:   Patient will meet greater than or equal to 90% of their needs  MONITOR:   Vent status,Labs,Weight trends,TF tolerance,I & O's  REASON FOR ASSESSMENT:   Ventilator,Consult Enteral/tube feeding initiation and management  ASSESSMENT:   69 year old female with PMHx of COPD, hx tracheostomy tube placement and PEG tube placement in 2017, HTN, depression, RLS, osteoporosis, hepatitis C, A-fib, anxiety, arthritis admitted with acute on chronic hypoxic hypercapnic respiratory failure and acute encephalopathy secondary to narcotic use and acute exacerbation of COPD.   12/14 intubated  Patient is currently intubated on ventilator support MV: 8.9 L/min Temp (24hrs), Avg:98.3 F (36.8 C), Min:94.5 F (34.7 C), Max:100.22 F (37.9 C)  Medications reviewed and include: Colace 100 mg BID, Novolog 0-9 units Q4hrs, Solu-Medrol 40 mg Q12hrs IV, Protonix, Miralax, NS at 75 mL/hr, azithromycin, Precedex gtt, fentanyl gtt, Versed gtt, norepinephrine gtt at 3 mcg/min.  Labs reviewed: CBG 132-133, Sodium 132, Chloride 92, BUN 38, Creatinine 1.83.  I/O: 1520 mL UOP yesterday  Weight trend: pt gaining wt per weights in chart; 46.9 kg on 01/15/2019, 47.2 kg on 08/30/2019, 55.8 kg on 01/20/2020, 58.9 kg on 06/06/2020  Enteral Access: 18 Fr. OGT placed 12/14; terminates in stomach per abdominal x-ray 12/14  Discussed with RN and on rounds. Possible EtOH use.  NUTRITION -  FOCUSED PHYSICAL EXAM:  Flowsheet Row Most Recent Value  Orbital Region No depletion  Upper Arm Region No depletion  Thoracic and Lumbar Region No depletion  Buccal Region Unable to assess  Temple Region No depletion  Clavicle Bone Region No depletion  Clavicle and Acromion Bone Region No depletion  Scapular Bone Region Unable to assess  Dorsal Hand Unable to assess  Patellar Region No depletion  Anterior Thigh Region No depletion  Posterior Calf Region Mild depletion  Edema (RD Assessment) None  Hair Reviewed  Eyes Unable to assess  Mouth Unable to assess  Skin Reviewed  Nails Reviewed     Diet Order:   Diet Order            Diet NPO time specified  Diet effective now                EDUCATION NEEDS:   No education needs have been identified at this time  Skin:  Skin Assessment: Reviewed RN Assessment  Last BM:  Unknown/PTA  Height:   Ht Readings from Last 1 Encounters:  06/06/20 5' 2.01" (1.575 m)   Weight:   Wt Readings from Last 1 Encounters:  06/06/20 58.9 kg   Ideal Body Weight:  50 kg  BMI:  Body mass index is 23.73 kg/m.  Estimated Nutritional Needs:   Kcal:  1422 (PSU 2003b)  Protein:  85-95 grams  Fluid:  1.5 L/day  Felix Pacini, MS, RD, LDN Pager number available on Amion

## 2020-06-07 NOTE — Progress Notes (Signed)
CRITICAL CARE NOTE  69 yo female with severe COPD admitted with acute on chronic hypoxic hypercapnic respiratory failure and acute encephalopathy secondary to narcotic use and AECOPD requiring mechanical intubation   12/14 admitted to ICU for COPD exacerbation 12/15 plan for SAT/SBT  CC  follow up respiratory failure  SUBJECTIVE Patient remains critically ill Prognosis is guarded   BP 108/80   Pulse 82   Temp 99.32 F (37.4 C)   Resp 16   Ht 5' 2.01" (1.575 m)   Wt 58.9 kg   SpO2 100%   BMI 23.73 kg/m    I/O last 3 completed shifts: In: 1261.4 [I.V.:1215.6; IV Piggyback:45.7] Out: 1520 [Urine:1520] No intake/output data recorded.  SpO2: 100 % O2 Flow Rate (L/min): 36 L/min FiO2 (%): 36 %  Estimated body mass index is 23.73 kg/m as calculated from the following:   Height as of this encounter: 5' 2.01" (1.575 m).   Weight as of this encounter: 58.9 kg.  SIGNIFICANT EVENTS   REVIEW OF SYSTEMS  PATIENT IS UNABLE TO PROVIDE COMPLETE REVIEW OF SYSTEMS DUE TO SEVERE CRITICAL ILLNESS        PHYSICAL EXAMINATION:  GENERAL:critically ill appearing, +resp distress HEAD: Normocephalic, atraumatic.  EYES: Pupils equal, round, reactive to light.  No scleral icterus.  MOUTH: Moist mucosal membrane. NECK: Supple.  PULMONARY: +rhonchi, +wheezing CARDIOVASCULAR: S1 and S2. Regular rate and rhythm. No murmurs, rubs, or gallops.  GASTROINTESTINAL: Soft, nontender, -distended.  Positive bowel sounds.   MUSCULOSKELETAL: No swelling, clubbing, or edema.  NEUROLOGIC: obtunded, GCS<8 SKIN:intact,warm,dry  MEDICATIONS: I have reviewed all medications and confirmed regimen as documented   CULTURE RESULTS   Recent Results (from the past 240 hour(s))  Resp Panel by RT-PCR (Flu A&B, Covid) Nasopharyngeal Swab     Status: None   Collection Time: 06/02/20  4:26 PM   Specimen: Nasopharyngeal Swab; Nasopharyngeal(NP) swabs in vial transport medium  Result Value Ref Range  Status   SARS Coronavirus 2 by RT PCR NEGATIVE NEGATIVE Final    Comment: (NOTE) SARS-CoV-2 target nucleic acids are NOT DETECTED.  The SARS-CoV-2 RNA is generally detectable in upper respiratory specimens during the acute phase of infection. The lowest concentration of SARS-CoV-2 viral copies this assay can detect is 138 copies/mL. A negative result does not preclude SARS-Cov-2 infection and should not be used as the sole basis for treatment or other patient management decisions. A negative result may occur with  improper specimen collection/handling, submission of specimen other than nasopharyngeal swab, presence of viral mutation(s) within the areas targeted by this assay, and inadequate number of viral copies(<138 copies/mL). A negative result must be combined with clinical observations, patient history, and epidemiological information. The expected result is Negative.  Fact Sheet for Patients:  EntrepreneurPulse.com.au  Fact Sheet for Healthcare Providers:  IncredibleEmployment.be  This test is no t yet approved or cleared by the [place] FDA and  has been authorized for detection and/or diagnosis of SARS-CoV-2 by FDA under an Emergency Use Authorization (EUA). This EUA will remain  in effect (meaning this test can be used) for the duration of the COVID-19 declaration under Section 564(b)(1) of the Act, 21 U.S.C.section 360bbb-3(b)(1), unless the authorization is terminated  or revoked sooner.       Influenza A by PCR NEGATIVE NEGATIVE Final   Influenza B by PCR NEGATIVE NEGATIVE Final    Comment: (NOTE) The Xpert Xpress SARS-CoV-2/FLU/RSV plus assay is intended as an aid in the diagnosis of influenza from Nasopharyngeal swab specimens and  should not be used as a sole basis for treatment. Nasal washings and aspirates are unacceptable for Xpert Xpress SARS-CoV-2/FLU/RSV testing.  Fact Sheet for  Patients: EntrepreneurPulse.com.au  Fact Sheet for Healthcare Providers: IncredibleEmployment.be  This test is not yet approved or cleared by the [place] FDA and has been authorized for detection and/or diagnosis of SARS-CoV-2 by FDA under an Emergency Use Authorization (EUA). This EUA will remain in effect (meaning this test can be used) for the duration of the COVID-19 declaration under Section 564(b)(1) of the Act, 21 U.S.C. section 360bbb-3(b)(1), unless the authorization is terminated or revoked.  Performed at [facility], [address] [place], [place] [address]   Resp Panel by RT-PCR (Flu A&B, Covid) Nasopharyngeal Swab     Status: None   Collection Time: 06/06/20 12:07 PM   Specimen: Nasopharyngeal Swab; Nasopharyngeal(NP) swabs in vial transport medium  Result Value Ref Range Status   SARS Coronavirus 2 by RT PCR NEGATIVE NEGATIVE Final    Comment: (NOTE) SARS-CoV-2 target nucleic acids are NOT DETECTED.  The SARS-CoV-2 RNA is generally detectable in upper respiratory specimens during the acute phase of infection. The lowest concentration of SARS-CoV-2 viral copies this assay can detect is 138 copies/mL. A negative result does not preclude SARS-Cov-2 infection and should not be used as the sole basis for treatment or other patient management decisions. A negative result may occur with  improper specimen collection/handling, submission of specimen other than nasopharyngeal swab, presence of viral mutation(s) within the areas targeted by this assay, and inadequate number of viral copies(<138 copies/mL). A negative result must be combined with clinical observations, patient history, and epidemiological information. The expected result is Negative.  Fact Sheet for Patients:  EntrepreneurPulse.com.au  Fact Sheet for Healthcare Providers:  IncredibleEmployment.be  This test is no t  yet approved or cleared by the [place] FDA and  has been authorized for detection and/or diagnosis of SARS-CoV-2 by FDA under an Emergency Use Authorization (EUA). This EUA will remain  in effect (meaning this test can be used) for the duration of the COVID-19 declaration under Section 564(b)(1) of the Act, 21 U.S.C.section 360bbb-3(b)(1), unless the authorization is terminated  or revoked sooner.       Influenza A by PCR NEGATIVE NEGATIVE Final   Influenza B by PCR NEGATIVE NEGATIVE Final    Comment: (NOTE) The Xpert Xpress SARS-CoV-2/FLU/RSV plus assay is intended as an aid in the diagnosis of influenza from Nasopharyngeal swab specimens and should not be used as a sole basis for treatment. Nasal washings and aspirates are unacceptable for Xpert Xpress SARS-CoV-2/FLU/RSV testing.  Fact Sheet for Patients: EntrepreneurPulse.com.au  Fact Sheet for Healthcare Providers: IncredibleEmployment.be  This test is not yet approved or cleared by the [place] FDA and has been authorized for detection and/or diagnosis of SARS-CoV-2 by FDA under an Emergency Use Authorization (EUA). This EUA will remain in effect (meaning this test can be used) for the duration of the COVID-19 declaration under Section 564(b)(1) of the Act, 21 U.S.C. section 360bbb-3(b)(1), unless the authorization is terminated or revoked.  Performed at [facility], [address] [place], [place] [address]   MRSA PCR Screening     Status: None   Collection Time: 06/06/20 11:00 PM   Specimen: Nasopharyngeal  Result Value Ref Range Status   MRSA by PCR NEGATIVE NEGATIVE Final    Comment:        The GeneXpert MRSA Assay (FDA approved for NASAL specimens only), is one component  of a comprehensive MRSA colonization surveillance program. It is not intended to diagnose MRSA infection nor to guide or monitor treatment for MRSA infections. Performed at [facility], [address] [place], [place] [address]           IMAGING    DG Chest Portable 1 View  Result Date: 06/06/2020 CLINICAL DATA:  Shortness of breath. Low O2 sats. Post intubation and NG tube placement. EXAM: PORTABLE CHEST 1 VIEW COMPARISON:  06/02/2020. FINDINGS: Endotracheal tube noted with tip 2 cm above the carina. NG tube noted with tip below left hemidiaphragm. Heart size normal. No focal infiltrate. No pleural effusion or pneumothorax. Small bilateral pleural effusions. Total right shoulder replacement. IMPRESSION: 1. Endotracheal tube and NG tube in good anatomic position. 2. No acute cardiopulmonary disease identified. Electronically Signed   By: Marcello Moores  Register   On: 06/06/2020 13:19   DG Abd Portable 1 View  Result Date: 06/06/2020 CLINICAL DATA:  Orogastric tube placement EXAM: PORTABLE ABDOMEN - 1 VIEW COMPARISON:  None. FINDINGS: Orogastric tube tip and side port in stomach. Visualized bowel unremarkable without dilatation or air-fluid level to suggest bowel obstruction. No free air. Visualized lung bases clear. IMPRESSION: Orogastric tube tip and side port in stomach. Visualized bowel unremarkable. No free air appreciable on supine examination. Electronically Signed   By: Lowella Grip III M.D.   On: 06/06/2020 13:16   [place] EKG SITE RITE  Result Date: 06/06/2020 If Site Rite image not attached, placement could not be confirmed due to current cardiac rhythm.    Nutrition Status:       Vent Mode: PRVC FiO2 (%):  [36 %] 36 % Set Rate:  [18 bmp] 18 bmp Vt Set:  [420 mL] 420 mL PEEP:  [5 cmH20] 5 cmH20 CBC    Component Value Date/Time   WBC 9.3 06/07/2020 0624   RBC 2.94 (L) 06/07/2020 0624   HGB 9.0 (L) 06/07/2020 0624   HCT 28.5 (L) 06/07/2020 0624   PLT 254 06/07/2020 0624   MCV 96.9 06/07/2020 0624   MCH 30.6 06/07/2020 0624   MCHC 31.6 06/07/2020 0624   RDW 13.2 06/07/2020 0624   LYMPHSABS 0.5 (L) 06/06/2020 1207   MONOABS 0.5  06/06/2020 1207   EOSABS 0.0 06/06/2020 1207   BASOSABS 0.0 06/06/2020 1207   BMP Latest Ref Rng & Units 06/06/2020 06/04/2020 06/03/2020  Glucose 70 - 99 mg/dL 163(H) 141(H) 121(H)  BUN 8 - 23 mg/dL 42(H) 23 14  Creatinine 0.44 - 1.00 mg/dL 2.78(H) 1.04(H) 0.75  Sodium 135 - 145 mmol/L 121(L) 136 135  Potassium 3.5 - 5.1 mmol/L 4.2 4.0 4.6  Chloride 98 - 111 mmol/L 76(L) 89(L) 87(L)  CO2 22 - 32 mmol/L 34(H) 39(H) 37(H)  Calcium 8.9 - 10.3 mg/dL 8.5(L) 8.9 8.8(L)        Indwelling Urinary Catheter continued, requirement due to   Reason to continue Indwelling Urinary Catheter strict Intake/Output monitoring for hemodynamic instability         Ventilator continued, requirement due to severe respiratory failure   Ventilator Sedation RASS 0 to -2      ASSESSMENT AND PLAN SYNOPSIS  69 yo female with acute COPD exacerbation leading to acute resp failure due to drug OD with narcotics  Severe ACUTE Hypoxic and Hypercapnic Respiratory Failure -continue Full MV support -continue Bronchodilator Therapy -Wean Fio2 and PEEP as tolerated -will perform SAT/SBT when respiratory parameters are met -VAP/VENT bundle implementation  SEVERE COPD EXACERBATION -continue IV steroids as prescribed -  continue NEB THERAPY as prescribed -morphine as needed -wean fio2 as needed and tolerated   ACUTE KIDNEY INJURY/Renal Failure -continue Foley Catheter-assess need -Avoid nephrotoxic agents -Follow urine output, BMP -Ensure adequate renal perfusion, optimize oxygenation -Renal dose medications     NEUROLOGY - intubated and sedated - minimal sedation to achieve a RASS goal: -1 Wake up assessment pending   CARDIAC ICU monitoring   GI GI PROPHYLAXIS as indicated  NUTRITIONAL STATUS Nutrition Status:    DIET-->TF's as tolerated Constipation protocol as indicated  ENDO - will use ICU hypoglycemic\Hyperglycemia protocol if indicated     ELECTROLYTES -follow labs as  needed -replace as needed -pharmacy consultation and following   DVT/GI PRX ordered and assessed TRANSFUSIONS AS NEEDED MONITOR FSBS I Assessed the need for Labs I Assessed the need for Foley I Assessed the need for Central Venous Line Family Discussion when available I Assessed the need for Mobilization I made an Assessment of medications to be adjusted accordingly Safety Risk assessment completed   CASE DISCUSSED IN MULTIDISCIPLINARY ROUNDS WITH ICU TEAM  Critical Care Time devoted to patient care services described in this note is 47  minutes.   Overall, patient is critically ill, prognosis is guarded.  Patient with Multiorgan failure and at high risk for cardiac arrest and death.    Corrin Parker, M.D.  [facility]  Medical Director [place] Director [facility] [place] Cardio-Pulmonary Department

## 2020-06-07 NOTE — Progress Notes (Signed)
Sedation turned off and switched to sbt on vent. Pt HR in the 130's and spo2 dropped to 60's. Pt put back to previous settings and sedation increased to keep pt comfortable. MD made aware.

## 2020-06-07 NOTE — Progress Notes (Signed)
*  PRELIMINARY RESULTS* Echocardiogram 2D Echocardiogram has been performed.  Cristela Blue 06/07/2020, 10:54 AM

## 2020-06-08 LAB — CBC
HCT: 24.5 % — ABNORMAL LOW (ref 36.0–46.0)
Hemoglobin: 7.8 g/dL — ABNORMAL LOW (ref 12.0–15.0)
MCH: 30.4 pg (ref 26.0–34.0)
MCHC: 31.8 g/dL (ref 30.0–36.0)
MCV: 95.3 fL (ref 80.0–100.0)
Platelets: 220 10*3/uL (ref 150–400)
RBC: 2.57 MIL/uL — ABNORMAL LOW (ref 3.87–5.11)
RDW: 13.5 % (ref 11.5–15.5)
WBC: 6.3 10*3/uL (ref 4.0–10.5)
nRBC: 0 % (ref 0.0–0.2)

## 2020-06-08 LAB — BASIC METABOLIC PANEL
Anion gap: 7 (ref 5–15)
BUN: 38 mg/dL — ABNORMAL HIGH (ref 8–23)
CO2: 28 mmol/L (ref 22–32)
Calcium: 7.8 mg/dL — ABNORMAL LOW (ref 8.9–10.3)
Chloride: 99 mmol/L (ref 98–111)
Creatinine, Ser: 1.41 mg/dL — ABNORMAL HIGH (ref 0.44–1.00)
GFR, Estimated: 40 mL/min — ABNORMAL LOW (ref >60)
Glucose, Bld: 161 mg/dL — ABNORMAL HIGH (ref 70–99)
Potassium: 3.5 mmol/L (ref 3.5–5.1)
Sodium: 134 mmol/L — ABNORMAL LOW (ref 135–145)

## 2020-06-08 LAB — PHOSPHORUS: Phosphorus: 2.2 mg/dL — ABNORMAL LOW (ref 2.5–4.6)

## 2020-06-08 LAB — GLUCOSE, CAPILLARY
Glucose-Capillary: 120 mg/dL — ABNORMAL HIGH (ref 70–99)
Glucose-Capillary: 138 mg/dL — ABNORMAL HIGH (ref 70–99)
Glucose-Capillary: 145 mg/dL — ABNORMAL HIGH (ref 70–99)
Glucose-Capillary: 147 mg/dL — ABNORMAL HIGH (ref 70–99)
Glucose-Capillary: 150 mg/dL — ABNORMAL HIGH (ref 70–99)

## 2020-06-08 LAB — MAGNESIUM: Magnesium: 2.6 mg/dL — ABNORMAL HIGH (ref 1.7–2.4)

## 2020-06-08 MED ORDER — MORPHINE SULFATE (PF) 2 MG/ML IV SOLN
INTRAVENOUS | Status: AC
  Administered 2020-06-08: 2 mg via INTRAVENOUS
  Filled 2020-06-08: qty 1

## 2020-06-08 MED ORDER — MORPHINE SULFATE (PF) 2 MG/ML IV SOLN
2.0000 mg | Freq: Once | INTRAVENOUS | Status: AC
  Administered 2020-06-08: 2 mg via INTRAVENOUS

## 2020-06-08 MED ORDER — POTASSIUM & SODIUM PHOSPHATES 280-160-250 MG PO PACK
2.0000 | PACK | Freq: Once | ORAL | Status: AC
  Administered 2020-06-08: 2
  Filled 2020-06-08: qty 2

## 2020-06-08 MED ORDER — CALCIUM CARBONATE-VITAMIN D 500-200 MG-UNIT PO TABS
1.0000 | ORAL_TABLET | Freq: Two times a day (BID) | ORAL | Status: DC
  Administered 2020-06-08 – 2020-06-09 (×2): 1
  Filled 2020-06-08 (×2): qty 1

## 2020-06-08 MED ORDER — POTASSIUM PHOSPHATES 15 MMOLE/5ML IV SOLN
15.0000 mmol | Freq: Once | INTRAVENOUS | Status: DC
  Filled 2020-06-08: qty 5

## 2020-06-08 NOTE — Progress Notes (Addendum)
1406 patient extubated after weaning. Patient placed on BiPAP immediately. Patient became upset and immediately became agitated. Given prn 1 mg Midazolam As ordered. Patient calm for a while but 1 hour later became agitated again. Given 1 mg of of Midazolam. Family in with patient.[id number] Order for 2 mg Morphine per Dr.Kasa due to agitation. Given. Patient calmer.

## 2020-06-08 NOTE — Consult Note (Signed)
PHARMACY CONSULT NOTE - FOLLOW UP  Pharmacy Consult for Electrolyte Monitoring and Replacement   Recent Labs: Potassium (mmol/L)  Date Value  06/08/2020 3.5   Magnesium (mg/dL)  Date Value  56/38/7564 2.6 (H)   Calcium (mg/dL)  Date Value  33/29/5188 7.8 (L)   Albumin (g/dL)  Date Value  41/66/0630 3.9   Phosphorus (mg/dL)  Date Value  16/06/930 2.2 (L)   Sodium (mmol/L)  Date Value  06/08/2020 134 (L)     Assessment: 69 yo female  admitted with acute on chronic hypoxic hypercapnic respiratory failure and acute encephalopathy secondary to narcotic use,  AECOPD requiring mechanical intubation, and acute kidney injury/failure.   Tube Feeds: Vital high protein @ 40/hr >> changed to Vital AL 1.2 Cal @ 50/hr on 12/15  Bowel Regimen Docusate 100 mg BID + Miralax 17 g daily starting 12/15   Goal of Therapy:  Electrolytes WNL  Plan:   Will give phos-nak 500 mg oral packet X 1 (~16 mmol Phos) due to national shortage of IV phosphorous  Start calcium-Vitamin D 500/200 mg  1 tablet BID  Will follow with daily BMP  Sharen Hones ,PharmD,BCPS Clinical Pharmacist 06/08/2020 11:44 AM

## 2020-06-08 NOTE — Progress Notes (Signed)
CRITICAL CARE NOTE   69 yo female with severe COPD admitted with acute on chronic hypoxic hypercapnic respiratory failure and acute encephalopathy secondary to narcotic use and AECOPD requiring mechanical intubation  12/14 admitted to ICU for COPD exacerbation 12/15 plan for SAT/SBT 12/16 failed SAT/SBT, will try again today   CC  follow up respiratory failure  SUBJECTIVE Patient remains critically ill Prognosis is guarded   BP (!) 113/97   Pulse (!) 101   Temp 99.3 F (37.4 C) (Bladder)   Resp (!) 21   Ht 5' 2.01" (1.575 m)   Wt 59.1 kg   SpO2 100%   BMI 23.82 kg/m    I/O last 3 completed shifts: In: 4519.2 [I.V.:4178.4; NG/GT:45; IV Piggyback:295.7] Out: 1970 [Urine:1970] No intake/output data recorded.  SpO2: 100 % O2 Flow Rate (L/min): 36 L/min FiO2 (%): 40 %  Estimated body mass index is 23.82 kg/m as calculated from the following:   Height as of this encounter: 5' 2.01" (1.575 m).   Weight as of this encounter: 59.1 kg.  SIGNIFICANT EVENTS   REVIEW OF SYSTEMS  PATIENT IS UNABLE TO PROVIDE COMPLETE REVIEW OF SYSTEMS DUE TO SEVERE CRITICAL ILLNESS        PHYSICAL EXAMINATION:  GENERAL:critically ill appearing, +resp distress HEAD: Normocephalic, atraumatic.  EYES: Pupils equal, round, reactive to light.  No scleral icterus.  MOUTH: Moist mucosal membrane. NECK: Supple.  PULMONARY: +rhonchi, +wheezing CARDIOVASCULAR: S1 and S2. Regular rate and rhythm. No murmurs, rubs, or gallops.  GASTROINTESTINAL: Soft, nontender, -distended.  Positive bowel sounds.   MUSCULOSKELETAL: No swelling, clubbing, or edema.  NEUROLOGIC: obtunded, GCS<8 SKIN:intact,warm,dry  MEDICATIONS: I have reviewed all medications and confirmed regimen as documented   CULTURE RESULTS   Recent Results (from the past 240 hour(s))  Resp Panel by RT-PCR (Flu A&B, Covid) Nasopharyngeal Swab     Status: None   Collection Time: 06/02/20  4:26 PM   Specimen: Nasopharyngeal  Swab; Nasopharyngeal(NP) swabs in vial transport medium  Result Value Ref Range Status   SARS Coronavirus 2 by RT PCR NEGATIVE NEGATIVE Final    Comment: (NOTE) SARS-CoV-2 target nucleic acids are NOT DETECTED.  The SARS-CoV-2 RNA is generally detectable in upper respiratory specimens during the acute phase of infection. The lowest concentration of SARS-CoV-2 viral copies this assay can detect is 138 copies/mL. A negative result does not preclude SARS-Cov-2 infection and should not be used as the sole basis for treatment or other patient management decisions. A negative result may occur with  improper specimen collection/handling, submission of specimen other than nasopharyngeal swab, presence of viral mutation(s) within the areas targeted by this assay, and inadequate number of viral copies(<138 copies/mL). A negative result must be combined with clinical observations, patient history, and epidemiological information. The expected result is Negative.  Fact Sheet for Patients:  EntrepreneurPulse.com.au  Fact Sheet for Healthcare Providers:  IncredibleEmployment.be  This test is no t yet approved or cleared by the [place] FDA and  has been authorized for detection and/or diagnosis of SARS-CoV-2 by FDA under an Emergency Use Authorization (EUA). This EUA will remain  in effect (meaning this test can be used) for the duration of the COVID-19 declaration under Section 564(b)(1) of the Act, 21 U.S.C.section 360bbb-3(b)(1), unless the authorization is terminated  or revoked sooner.       Influenza A by PCR NEGATIVE NEGATIVE Final   Influenza B by PCR NEGATIVE NEGATIVE Final    Comment: (NOTE) The Xpert Xpress SARS-CoV-2/FLU/RSV plus assay is intended  as an aid in the diagnosis of influenza from Nasopharyngeal swab specimens and should not be used as a sole basis for treatment. Nasal washings and aspirates are unacceptable for Xpert Xpress  SARS-CoV-2/FLU/RSV testing.  Fact Sheet for Patients: EntrepreneurPulse.com.au  Fact Sheet for Healthcare Providers: IncredibleEmployment.be  This test is not yet approved or cleared by the [place] FDA and has been authorized for detection and/or diagnosis of SARS-CoV-2 by FDA under an Emergency Use Authorization (EUA). This EUA will remain in effect (meaning this test can be used) for the duration of the COVID-19 declaration under Section 564(b)(1) of the Act, 21 U.S.C. section 360bbb-3(b)(1), unless the authorization is terminated or revoked.  Performed at [facility], [address] [place], [place] [address]   Blood culture (routine x 2)     Status: None (Preliminary result)   Collection Time: 06/06/20 12:07 PM   Specimen: BLOOD  Result Value Ref Range Status   Specimen Description BLOOD RIGHT [facility]  Final   Special Requests   Final    BOTTLES DRAWN AEROBIC AND ANAEROBIC Blood Culture adequate volume   Culture   Final    NO GROWTH 2 DAYS Performed at [facility], [address] [place], [place] [address]    Report Status PENDING  Incomplete  Blood culture (routine x 2)     Status: None (Preliminary result)   Collection Time: 06/06/20 12:07 PM   Specimen: BLOOD  Result Value Ref Range Status   Specimen Description BLOOD LEFT AC  Final   Special Requests   Final    BOTTLES DRAWN AEROBIC AND ANAEROBIC Blood Culture adequate volume   Culture   Final    NO GROWTH 2 DAYS Performed at [facility], [address], [place], [place] [address]    Report Status PENDING  Incomplete  Resp Panel by RT-PCR (Flu A&B, Covid) Nasopharyngeal Swab     Status: None   Collection Time: 06/06/20 12:07 PM   Specimen: Nasopharyngeal Swab; Nasopharyngeal(NP) swabs in vial transport medium  Result Value Ref Range Status   SARS Coronavirus 2 by RT PCR NEGATIVE NEGATIVE Final    Comment: (NOTE) SARS-CoV-2 target nucleic  acids are NOT DETECTED.  The SARS-CoV-2 RNA is generally detectable in upper respiratory specimens during the acute phase of infection. The lowest concentration of SARS-CoV-2 viral copies this assay can detect is 138 copies/mL. A negative result does not preclude SARS-Cov-2 infection and should not be used as the sole basis for treatment or other patient management decisions. A negative result may occur with  improper specimen collection/handling, submission of specimen other than nasopharyngeal swab, presence of viral mutation(s) within the areas targeted by this assay, and inadequate number of viral copies(<138 copies/mL). A negative result must be combined with clinical observations, patient history, and epidemiological information. The expected result is Negative.  Fact Sheet for Patients:  EntrepreneurPulse.com.au  Fact Sheet for Healthcare Providers:  IncredibleEmployment.be  This test is no t yet approved or cleared by the [place] FDA and  has been authorized for detection and/or diagnosis of SARS-CoV-2 by FDA under an Emergency Use Authorization (EUA). This EUA will remain  in effect (meaning this test can be used) for the duration of the COVID-19 declaration under Section 564(b)(1) of the Act, 21 U.S.C.section 360bbb-3(b)(1), unless the authorization is terminated  or revoked sooner.       Influenza A by PCR NEGATIVE NEGATIVE Final   Influenza B by PCR NEGATIVE NEGATIVE Final    Comment: (  NOTE) The Xpert Xpress SARS-CoV-2/FLU/RSV plus assay is intended as an aid in the diagnosis of influenza from Nasopharyngeal swab specimens and should not be used as a sole basis for treatment. Nasal washings and aspirates are unacceptable for Xpert Xpress SARS-CoV-2/FLU/RSV testing.  Fact Sheet for Patients: EntrepreneurPulse.com.au  Fact Sheet for Healthcare Providers: IncredibleEmployment.be  This  test is not yet approved or cleared by the [place] FDA and has been authorized for detection and/or diagnosis of SARS-CoV-2 by FDA under an Emergency Use Authorization (EUA). This EUA will remain in effect (meaning this test can be used) for the duration of the COVID-19 declaration under Section 564(b)(1) of the Act, 21 U.S.C. section 360bbb-3(b)(1), unless the authorization is terminated or revoked.  Performed at [facility], [address] [place], [place] [address]   MRSA PCR Screening     Status: None   Collection Time: 06/06/20 11:00 PM   Specimen: Nasopharyngeal  Result Value Ref Range Status   MRSA by PCR NEGATIVE NEGATIVE Final    Comment:        The GeneXpert MRSA Assay (FDA approved for NASAL specimens only), is one component of a comprehensive MRSA colonization surveillance program. It is not intended to diagnose MRSA infection nor to guide or monitor treatment for MRSA infections. Performed at [facility], [address] [place], [place] [address]           IMAGING    ECHOCARDIOGRAM COMPLETE  Result Date: 06/07/2020    ECHOCARDIOGRAM REPORT   Patient Name:   ANUJA MANKA Date of Exam: 06/07/2020 Medical Rec #:  [id number]         Height:       62.0 in Accession #:    [id number]        Weight:       129.8 lb Date of Birth:  [birth year]         BSA:          1.591 m Patient Age:    71 years          BP:           84/55 mmHg Patient Gender: F                 HR:           106 bpm. Exam Location:  [facility] Procedure: 2D Echo, Cardiac Doppler and Color Doppler Indications:     Acute respiratory insufficiency 518.82  History:         Patient has prior history of Echocardiogram examinations, most                  recent 11/10/2016. COPD, Arrythmias:Atrial Fibrillation; Risk                  Factors:Hypertension.  Sonographer:     Sherrie Sport RDCS (AE) Referring Phys:  [id number] Awilda Bill Diagnosing Phys: Yolonda Kida MD  Sonographer  Comments: Technically challenging study due to limited acoustic windows, no apical window, no parasternal window and echo performed with patient supine and on artificial respirator. Subcostal was the only obtainable view. IMPRESSIONS  1. Left ventricular ejection fraction, by estimation, is 65 to 70%. The left ventricle has normal function. The left ventricle has no regional wall motion abnormalities. Left ventricular diastolic function could not be evaluated.  2. Right ventricular systolic function is normal. The right ventricular size is normal.  3. The mitral valve is normal in structure. No evidence  of mitral valve regurgitation.  4. The aortic valve is normal in structure. Aortic valve regurgitation is not visualized. Conclusion(s)/Recommendation(s): Poor windows for evaluation of left ventricular function by transthoracic echocardiography. Would recommend an alternative means of evaluation. FINDINGS  Left Ventricle: Left ventricular ejection fraction, by estimation, is 65 to 70%. The left ventricle has normal function. The left ventricle has no regional wall motion abnormalities. The left ventricular internal cavity size was small. There is no left ventricular hypertrophy. Left ventricular diastolic function could not be evaluated. Right Ventricle: The right ventricular size is normal. No increase in right ventricular wall thickness. Right ventricular systolic function is normal. Left Atrium: Left atrial size was normal in size. Right Atrium: Right atrial size was normal in size. Pericardium: There is no evidence of pericardial effusion. Mitral Valve: The mitral valve is normal in structure. No evidence of mitral valve regurgitation. Tricuspid Valve: The tricuspid valve is normal in structure. Tricuspid valve regurgitation is trivial. Aortic Valve: The aortic valve is normal in structure. Aortic valve regurgitation is not visualized. Pulmonic Valve: The pulmonic valve was grossly normal. Pulmonic valve  regurgitation is not visualized. Aorta: The ascending aorta was not well visualized. IAS/Shunts: No atrial level shunt detected by color flow Doppler.  LEFT VENTRICLE PLAX 2D LVIDd:         2.72 cm LVIDs:         1.69 cm LV PW:         1.01 cm LV IVS:        0.93 cm  LEFT ATRIUM         Index LA diam:    2.90 cm 1.82 cm/m  PULMONIC VALVE PV Vmax:        0.75 m/s PV Peak grad:   2.3 mmHg RVOT Peak grad: 4 mmHg  TRICUSPID VALVE TR Peak grad:   25.0 mmHg TR Vmax:        250.00 cm/s Dwayne D Callwood MD Electronically signed by Yolonda Kida MD Signature Date/Time: 06/07/2020/7:27:52 PM    Final      Nutrition Status: Nutrition Problem: Inadequate oral intake Etiology: inability to eat Signs/Symptoms: NPO status Interventions: Tube feeding     Indwelling Urinary Catheter continued, requirement due to   Reason to continue Indwelling Urinary Catheter strict Intake/Output monitoring for hemodynamic instability   Central Line/ continued, requirement due to  Reason to continue [place] of central venous pressure or other hemodynamic parameters and poor IV access   Ventilator continued, requirement due to severe respiratory failure   Ventilator Sedation RASS 0 to -2      ASSESSMENT AND PLAN SYNOPSIS  69 yo female with acute COPD exacerbation leading to acute resp failure due to drug OD with narcotics  Severe ACUTE Hypoxic and Hypercapnic Respiratory Failure -continue Full MV support -continue Bronchodilator Therapy -Wean Fio2 and PEEP as tolerated -will perform SAT/SBT when respiratory parameters are met -VAP/VENT bundle implementation   SEVERE COPD EXACERBATION -continue IV steroids as prescribed -continue NEB THERAPY as prescribed -morphine as needed -wean fio2 as needed and tolerated    ACUTE KIDNEY INJURY/Renal Failure -continue Foley Catheter-assess need -Avoid nephrotoxic agents -Follow urine output, BMP -Ensure adequate renal perfusion, optimize  oxygenation -Renal dose medications     NEUROLOGY - intubated and sedated - minimal sedation to achieve a RASS goal: -1 Wake up assessment pending   CARDIAC ICU monitoring    GI GI PROPHYLAXIS as indicated  NUTRITIONAL STATUS Nutrition Status: Nutrition Problem: Inadequate oral intake Etiology:  inability to eat Signs/Symptoms: NPO status Interventions: Tube feeding   DIET-->TF's as tolerated Constipation protocol as indicated  ENDO - will use ICU hypoglycemic\Hyperglycemia protocol if indicated     ELECTROLYTES -follow labs as needed -replace as needed -pharmacy consultation and following   DVT/GI PRX ordered and assessed TRANSFUSIONS AS NEEDED MONITOR FSBS I Assessed the need for Labs I Assessed the need for Foley I Assessed the need for Central Venous Line Family Discussion when available I Assessed the need for Mobilization I made an Assessment of medications to be adjusted accordingly Safety Risk assessment completed   CASE DISCUSSED IN MULTIDISCIPLINARY ROUNDS WITH ICU TEAM  Critical Care Time devoted to patient care services described in this note is 45 minutes.   Overall, patient is critically ill, prognosis is guarded.  Patient with Multiorgan failure and at high risk for cardiac arrest and death.    Corrin Parker, M.D.  [facility]  Medical Director [place] Director [facility] [place] [facility]

## 2020-06-08 NOTE — Progress Notes (Signed)
Remains off the vent and on 6 L [place]. Family with patient.

## 2020-06-08 NOTE — Progress Notes (Signed)
Pt extubated and placed on bipap, tolerating OK, pt anxious. RN aware.

## 2020-06-09 LAB — CBC
HCT: 24.7 % — ABNORMAL LOW (ref 36.0–46.0)
Hemoglobin: 7.9 g/dL — ABNORMAL LOW (ref 12.0–15.0)
MCH: 31.5 pg (ref 26.0–34.0)
MCHC: 32 g/dL (ref 30.0–36.0)
MCV: 98.4 fL (ref 80.0–100.0)
Platelets: 222 10*3/uL (ref 150–400)
RBC: 2.51 MIL/uL — ABNORMAL LOW (ref 3.87–5.11)
RDW: 13.9 % (ref 11.5–15.5)
WBC: 8.1 10*3/uL (ref 4.0–10.5)
nRBC: 0.2 % (ref 0.0–0.2)

## 2020-06-09 LAB — BASIC METABOLIC PANEL
Anion gap: 7 (ref 5–15)
BUN: 38 mg/dL — ABNORMAL HIGH (ref 8–23)
CO2: 29 mmol/L (ref 22–32)
Calcium: 8.3 mg/dL — ABNORMAL LOW (ref 8.9–10.3)
Chloride: 104 mmol/L (ref 98–111)
Creatinine, Ser: 0.97 mg/dL (ref 0.44–1.00)
GFR, Estimated: >60 mL/min (ref >60)
Glucose, Bld: 137 mg/dL — ABNORMAL HIGH (ref 70–99)
Potassium: 4.4 mmol/L (ref 3.5–5.1)
Sodium: 140 mmol/L (ref 135–145)

## 2020-06-09 LAB — GLUCOSE, CAPILLARY
Glucose-Capillary: 139 mg/dL — ABNORMAL HIGH (ref 70–99)
Glucose-Capillary: 109 mg/dL — ABNORMAL HIGH (ref 70–99)
Glucose-Capillary: 100 mg/dL — ABNORMAL HIGH (ref 70–99)
Glucose-Capillary: 111 mg/dL — ABNORMAL HIGH (ref 70–99)
Glucose-Capillary: 99 mg/dL (ref 70–99)

## 2020-06-09 LAB — MAGNESIUM: Magnesium: 2.2 mg/dL (ref 1.7–2.4)

## 2020-06-09 LAB — PHOSPHORUS: Phosphorus: 3.8 mg/dL (ref 2.5–4.6)

## 2020-06-09 MED ORDER — APIXABAN 5 MG PO TABS: 5.0000 mg | ORAL_TABLET | Freq: Two times a day (BID) | ORAL | Status: DC

## 2020-06-09 MED ORDER — DIAZEPAM 2 MG PO TABS
2.0000 mg | ORAL_TABLET | Freq: Three times a day (TID) | ORAL | Status: DC | PRN
  Administered 2020-06-09: 2 mg via ORAL
  Filled 2020-06-09: qty 1

## 2020-06-09 MED ORDER — METOPROLOL TARTRATE 50 MG PO TABS
50.0000 mg | ORAL_TABLET | Freq: Two times a day (BID) | ORAL | Status: DC
  Administered 2020-06-09 – 2020-06-10 (×3): 50 mg via ORAL
  Filled 2020-06-09 (×3): qty 1

## 2020-06-09 MED ORDER — GABAPENTIN 300 MG PO CAPS
300.0000 mg | ORAL_CAPSULE | Freq: Three times a day (TID) | ORAL | Status: DC
  Administered 2020-06-09 – 2020-06-10 (×4): 300 mg via ORAL
  Filled 2020-06-09 (×5): qty 1

## 2020-06-09 MED ORDER — AMIODARONE HCL IN DEXTROSE 360-4.14 MG/200ML-% IV SOLN
60.0000 mg/h | INTRAVENOUS | Status: AC
  Administered 2020-06-09 (×2): 60 mg/h via INTRAVENOUS
  Filled 2020-06-09: qty 200

## 2020-06-09 MED ORDER — INSULIN ASPART 100 UNIT/ML ~~LOC~~ SOLN
0.0000 [IU] | Freq: Three times a day (TID) | SUBCUTANEOUS | Status: DC
  Filled 2020-06-09: qty 1

## 2020-06-09 MED ORDER — CALCIUM CARBONATE-VITAMIN D 500-200 MG-UNIT PO TABS
1.0000 | ORAL_TABLET | Freq: Every day | ORAL | Status: DC
  Administered 2020-06-10: 08:00:00 1 via ORAL
  Filled 2020-06-09: qty 1

## 2020-06-09 MED ORDER — CALCIUM CARBONATE-VITAMIN D 500-200 MG-UNIT PO TABS: 1.0000 | ORAL_TABLET | Freq: Two times a day (BID) | ORAL | Status: DC

## 2020-06-09 MED ORDER — METOPROLOL TARTRATE 5 MG/5ML IV SOLN: 5.0000 mg | Freq: Once | INTRAVENOUS | Status: AC

## 2020-06-09 MED ORDER — POLYETHYLENE GLYCOL 3350 17 G PO PACK: 17.0000 g | PACK | Freq: Every day | ORAL | Status: DC

## 2020-06-09 MED ORDER — PANTOPRAZOLE SODIUM 40 MG PO TBEC
40.0000 mg | DELAYED_RELEASE_TABLET | Freq: Every day | ORAL | Status: DC
  Administered 2020-06-09: 40 mg via ORAL
  Filled 2020-06-09: qty 1

## 2020-06-09 MED ORDER — QUETIAPINE FUMARATE 25 MG PO TABS
25.0000 mg | ORAL_TABLET | Freq: Every day | ORAL | Status: DC
Start: 2020-06-09 — End: 2020-06-10
  Administered 2020-06-09: 25 mg via ORAL
  Filled 2020-06-09: qty 1

## 2020-06-09 MED ORDER — AMITRIPTYLINE HCL 25 MG PO TABS
25.0000 mg | ORAL_TABLET | Freq: Every day | ORAL | Status: DC
Start: 2020-06-09 — End: 2020-06-10
  Administered 2020-06-09: 25 mg via ORAL
  Filled 2020-06-09 (×2): qty 1

## 2020-06-09 MED ORDER — METOPROLOL TARTRATE 5 MG/5ML IV SOLN
INTRAVENOUS | Status: AC
  Administered 2020-06-09: 5 mg via INTRAVENOUS
  Filled 2020-06-09: qty 5

## 2020-06-09 MED ORDER — AMIODARONE HCL IN DEXTROSE 360-4.14 MG/200ML-% IV SOLN
30.0000 mg/h | INTRAVENOUS | Status: DC
  Administered 2020-06-10: 30 mg/h via INTRAVENOUS
  Filled 2020-06-09 (×2): qty 200

## 2020-06-09 MED ORDER — AMIODARONE LOAD VIA INFUSION
150.0000 mg | Freq: Once | INTRAVENOUS | Status: AC
  Administered 2020-06-09: 150 mg via INTRAVENOUS
  Filled 2020-06-09: qty 83.34

## 2020-06-09 MED ORDER — DOCUSATE SODIUM 100 MG PO CAPS: 100.0000 mg | ORAL_CAPSULE | Freq: Two times a day (BID) | ORAL | Status: DC

## 2020-06-09 MED ORDER — APIXABAN 5 MG PO TABS
5.0000 mg | ORAL_TABLET | Freq: Two times a day (BID) | ORAL | Status: DC
  Administered 2020-06-09 – 2020-06-10 (×3): 5 mg via ORAL
  Filled 2020-06-09 (×3): qty 1

## 2020-06-09 NOTE — Consult Note (Signed)
PHARMACY CONSULT NOTE - FOLLOW UP  Pharmacy Consult for Electrolyte Monitoring and Replacement   Recent Labs: Potassium (mmol/L)  Date Value  06/09/2020 4.4   Magnesium (mg/dL)  Date Value  45/08/8880 2.2   Calcium (mg/dL)  Date Value  80/08/4915 8.3 (L)   Albumin (g/dL)  Date Value  91/50/5697 3.9   Phosphorus (mg/dL)  Date Value  94/80/1655 3.8   Sodium (mmol/L)  Date Value  06/09/2020 140     Assessment: 69 yo female  admitted with acute on chronic hypoxic hypercapnic respiratory failure and acute encephalopathy secondary to narcotic use,  AECOPD requiring mechanical intubation, and acute kidney injury/failure.  12/16 Patient extubated and off tube feeds 12/17 Diet ordered, AKI resolving   Tube Feeds: Vital high protein @ 40/hr >> changed to Vital AL 1.2 Cal @ 50/hr on 12/15 >> stopped 12/16  Bowel Regimen Docusate 100 mg BID + Miralax 17 g daily starting 12/15   Goal of Therapy:  Electrolytes WNL  Plan:   Decrease calcium-Vitamin D 500/200 mg to once daily   Recheck electrolytes with AM labs  Sharen Hones ,PharmD,BCPS Clinical Pharmacist 06/09/2020 2:38 PM

## 2020-06-09 NOTE — Progress Notes (Signed)
Nutrition Follow-up  DOCUMENTATION CODES:   Not applicable  INTERVENTION:  Downgrade diet to dysphagia 3 (mechanical soft).  Provide Ensure Enlive po BID, each supplement provides 350 kcal and 20 grams of protein.  NUTRITION DIAGNOSIS:   Increased nutrient needs related to catabolic illness (COPD) as evidenced by estimated needs.  New nutrition diagnosis.  GOAL:   Patient will meet greater than or equal to 90% of their needs  Progressing with diet advancement.  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  12/14 intubated 12/16 extubated  Met with patient and family member at bedside. Patient reports her appetite is decreased today. Her family brought her dentures in today. She reports she still will need mechanical soft food to be able to eat. Patient reports her appetite is fairly good at baseline but she does not eat a lot at meals typically. Discussed importance of adequate nutrition. Patient is amenable to drinking oral nutrition supplements to help meet estimated needs.  Medications reviewed and include: Oscal with D 1 tablet BID, Colace 100 mg BID, Novolog 0-9 units Q4hrs, Solu-Medrol 40 mg Q12hrs IV, Protonix, Miralax.  Labs reviewed: CBG 100-139, BUN 38.  I/O: 900 mL UOP yesterday  Weight trend: 62.6 kg on 12/17; +3.7 kg from 12/14  Discussed on rounds.  Diet Order:   Diet Order            Diet regular Room service appropriate? Yes; Fluid consistency: Thin  Diet effective now                 EDUCATION NEEDS:   No education needs have been identified at this time  Skin:  Skin Assessment: Reviewed RN Assessment  Last BM:  06/09/2020 - medium type 6  Height:   Ht Readings from Last 1 Encounters:  06/06/20 5' 2.01" (1.575 m)   Weight:   Wt Readings from Last 1 Encounters:  06/09/20 62.6 kg   Ideal Body Weight:  50 kg  BMI:  Body mass index is 25.24 kg/m.  Estimated Nutritional Needs:   Kcal:  1700-1900  Protein:  85-95 grams  Fluid:  1.7-1.9 L/day  Jacklynn Barnacle, MS, RD, LDN Pager number available on Amion

## 2020-06-09 NOTE — Progress Notes (Signed)
CRITICAL CARE NOTE   69 yo female with severe COPD admitted with acute on chronic hypoxic hypercapnic respiratory failure and acute encephalopathy secondary to narcotic use and AECOPD requiring mechanical intubation  12/14 admitted to ICU for COPD exacerbation 12/15 plan for SAT/SBT 12/16 failed SAT/SBT, will try again today 12/16 s/p extubation to BiPAP 12/17 resp status still tenious   CC  follow up COPD and resp failure  SUBJECTIVE S/p extubation Remains critically ill More alert awake   BP 122/78   Pulse 68   Temp (!) 97.16 F (36.2 C) (Bladder)   Resp 17   Ht 5' 2.01" (1.575 m)   Wt 62.6 kg   SpO2 100%   BMI 25.24 kg/m    I/O last 3 completed shifts: In: 3653.8 [I.V.:3653.8] Out: 1330 [Urine:1330] No intake/output data recorded.  SpO2: 100 % O2 Flow Rate (L/min): 5 L/min FiO2 (%): 24 %  Estimated body mass index is 25.24 kg/m as calculated from the following:   Height as of this encounter: 5' 2.01" (1.575 m).   Weight as of this encounter: 62.6 kg.  SIGNIFICANT EVENTS   REVIEW OF SYSTEMS LIMITED ROS +SOB     PHYSICAL EXAMINATION:  GENERAL:critically ill appearing, +resp distress HEAD: Normocephalic, atraumatic.  EYES: Pupils equal, round, reactive to light.  No scleral icterus.  MOUTH: Moist mucosal membrane. NECK: Supple. No thyromegaly. No nodules. No JVD.  PULMONARY: +rhonchi, +wheezing CARDIOVASCULAR: S1 and S2. Regular rate and rhythm. No murmurs, rubs, or gallops.  GASTROINTESTINAL: Soft, nontender, -distended. Positive bowel sounds.  MUSCULOSKELETAL: No swelling, clubbing, or edema.  NEUROLOGIC:alert  SKIN:intact,warm,dry    MEDICATIONS: I have reviewed all medications and confirmed regimen as documented   CULTURE RESULTS   Recent Results (from the past 240 hour(s))  Resp Panel by RT-PCR (Flu A&B, Covid) Nasopharyngeal Swab     Status: None   Collection Time: 06/02/20  4:26 PM   Specimen: Nasopharyngeal Swab;  Nasopharyngeal(NP) swabs in vial transport medium  Result Value Ref Range Status   SARS Coronavirus 2 by RT PCR NEGATIVE NEGATIVE Final    Comment: (NOTE) SARS-CoV-2 target nucleic acids are NOT DETECTED.  The SARS-CoV-2 RNA is generally detectable in upper respiratory specimens during the acute phase of infection. The lowest concentration of SARS-CoV-2 viral copies this assay can detect is 138 copies/mL. A negative result does not preclude SARS-Cov-2 infection and should not be used as the sole basis for treatment or other patient management decisions. A negative result may occur with  improper specimen collection/handling, submission of specimen other than nasopharyngeal swab, presence of viral mutation(s) within the areas targeted by this assay, and inadequate number of viral copies(<138 copies/mL). A negative result must be combined with clinical observations, patient history, and epidemiological information. The expected result is Negative.  Fact Sheet for Patients:  BloggerCourse.com  Fact Sheet for Healthcare Providers:  SeriousBroker.it  This test is no t yet approved or cleared by the [place] FDA and  has been authorized for detection and/or diagnosis of SARS-CoV-2 by FDA under an Emergency Use Authorization (EUA). This EUA will remain  in effect (meaning this test can be used) for the duration of the COVID-19 declaration under Section 564(b)(1) of the Act, 21 U.S.C.section 360bbb-3(b)(1), unless the authorization is terminated  or revoked sooner.       Influenza A by PCR NEGATIVE NEGATIVE Final   Influenza B by PCR NEGATIVE NEGATIVE Final    Comment: (NOTE) The Xpert Xpress SARS-CoV-2/FLU/RSV plus assay is intended as an  aid in the diagnosis of influenza from Nasopharyngeal swab specimens and should not be used as a sole basis for treatment. Nasal washings and aspirates are unacceptable for Xpert Xpress  SARS-CoV-2/FLU/RSV testing.  Fact Sheet for Patients: BloggerCourse.com  Fact Sheet for Healthcare Providers: SeriousBroker.it  This test is not yet approved or cleared by the [place] FDA and has been authorized for detection and/or diagnosis of SARS-CoV-2 by FDA under an Emergency Use Authorization (EUA). This EUA will remain in effect (meaning this test can be used) for the duration of the COVID-19 declaration under Section 564(b)(1) of the Act, 21 U.S.C. section 360bbb-3(b)(1), unless the authorization is terminated or revoked.  Performed at [facility], [address] [place], [place] [address]   Blood culture (routine x 2)     Status: None (Preliminary result)   Collection Time: 06/06/20 12:07 PM   Specimen: BLOOD  Result Value Ref Range Status   Specimen Description BLOOD RIGHT [facility]  Final   Special Requests   Final    BOTTLES DRAWN AEROBIC AND ANAEROBIC Blood Culture adequate volume   Culture   Final    NO GROWTH 2 DAYS Performed at [facility], [address], [place], [place] [address]    Report Status PENDING  Incomplete  Blood culture (routine x 2)     Status: None (Preliminary result)   Collection Time: 06/06/20 12:07 PM   Specimen: BLOOD  Result Value Ref Range Status   Specimen Description BLOOD LEFT AC  Final   Special Requests   Final    BOTTLES DRAWN AEROBIC AND ANAEROBIC Blood Culture adequate volume   Culture   Final    NO GROWTH 2 DAYS Performed at [facility], [address], [place], [place] [address]    Report Status PENDING  Incomplete  Resp Panel by RT-PCR (Flu A&B, Covid) Nasopharyngeal Swab     Status: None   Collection Time: 06/06/20 12:07 PM   Specimen: Nasopharyngeal Swab; Nasopharyngeal(NP) swabs in vial transport medium  Result Value Ref Range Status   SARS Coronavirus 2 by RT PCR NEGATIVE NEGATIVE Final    Comment: (NOTE) SARS-CoV-2 target nucleic  acids are NOT DETECTED.  The SARS-CoV-2 RNA is generally detectable in upper respiratory specimens during the acute phase of infection. The lowest concentration of SARS-CoV-2 viral copies this assay can detect is 138 copies/mL. A negative result does not preclude SARS-Cov-2 infection and should not be used as the sole basis for treatment or other patient management decisions. A negative result may occur with  improper specimen collection/handling, submission of specimen other than nasopharyngeal swab, presence of viral mutation(s) within the areas targeted by this assay, and inadequate number of viral copies(<138 copies/mL). A negative result must be combined with clinical observations, patient history, and epidemiological information. The expected result is Negative.  Fact Sheet for Patients:  BloggerCourse.com  Fact Sheet for Healthcare Providers:  SeriousBroker.it  This test is no t yet approved or cleared by the [place] FDA and  has been authorized for detection and/or diagnosis of SARS-CoV-2 by FDA under an Emergency Use Authorization (EUA). This EUA will remain  in effect (meaning this test can be used) for the duration of the COVID-19 declaration under Section 564(b)(1) of the Act, 21 U.S.C.section 360bbb-3(b)(1), unless the authorization is terminated  or revoked sooner.       Influenza A by PCR NEGATIVE NEGATIVE Final   Influenza B by PCR NEGATIVE NEGATIVE Final    Comment: (NOTE) The  Xpert Xpress SARS-CoV-2/FLU/RSV plus assay is intended as an aid in the diagnosis of influenza from Nasopharyngeal swab specimens and should not be used as a sole basis for treatment. Nasal washings and aspirates are unacceptable for Xpert Xpress SARS-CoV-2/FLU/RSV testing.  Fact Sheet for Patients: BloggerCourse.com  Fact Sheet for Healthcare Providers: SeriousBroker.it  This  test is not yet approved or cleared by the [place] FDA and has been authorized for detection and/or diagnosis of SARS-CoV-2 by FDA under an Emergency Use Authorization (EUA). This EUA will remain in effect (meaning this test can be used) for the duration of the COVID-19 declaration under Section 564(b)(1) of the Act, 21 U.S.C. section 360bbb-3(b)(1), unless the authorization is terminated or revoked.  Performed at [facility], [address] [place], [place] [address]   MRSA PCR Screening     Status: None   Collection Time: 06/06/20 11:00 PM   Specimen: Nasopharyngeal  Result Value Ref Range Status   MRSA by PCR NEGATIVE NEGATIVE Final    Comment:        The GeneXpert MRSA Assay (FDA approved for NASAL specimens only), is one component of a comprehensive MRSA colonization surveillance program. It is not intended to diagnose MRSA infection nor to guide or monitor treatment for MRSA infections. Performed at [facility], [address], [place], [place] [address]           IMAGING    No results found.   Nutrition Status: Nutrition Problem: Inadequate oral intake Etiology: inability to eat Signs/Symptoms: NPO status Interventions: Tube feeding      ASSESSMENT AND PLAN SYNOPSIS  69 yo female with acute COPD exacerbation leading to acute resp failure due to drug OD with narcotics  Severe ACUTE Hypoxic and Hypercapnic Respiratory Failure biPAP as needed High risk for intubation   SEVERE COPD EXACERBATION -continue IV steroids as prescribed -continue NEB THERAPY as prescribed -morphine as needed -wean fio2 as needed and tolerated   ACUTE KIDNEY INJURY/Renal Failure -continue Foley Catheter-assess need -Avoid nephrotoxic agents -Follow urine output, BMP -Ensure adequate renal perfusion, optimize oxygenation -Renal dose medications   CARDIAC ICU monitoring    DIET-->SPEECH EVAL pending Constipation protocol as  indicated  ENDO - will use ICU hypoglycemic\Hyperglycemia protocol if indicated     ELECTROLYTES -follow labs as needed -replace as needed -pharmacy consultation and following   DVT/GI PRX ordered and assessed TRANSFUSIONS AS NEEDED MONITOR FSBS I Assessed the need for Labs I Assessed the need for Foley I Assessed the need for Central Venous Line Family Discussion when available I Assessed the need for Mobilization I made an Assessment of medications to be adjusted accordingly Safety Risk assessment completed   CASE DISCUSSED IN MULTIDISCIPLINARY ROUNDS WITH ICU TEAM     Critical Care Time devoted to patient care services described in this note is 32 minutes.   Overall, patient is critically ill, prognosis is guarded.     Lucie Leather, M.D.  [facility]  Medical Director [facility] Medical Director [facility] Cardio-Pulmonary Department

## 2020-06-10 ENCOUNTER — Inpatient Hospital Stay: Payer: Medicare Other

## 2020-06-10 DIAGNOSIS — J9602 Acute respiratory failure with hypercapnia: Secondary | ICD-10-CM

## 2020-06-10 DIAGNOSIS — J441 Chronic obstructive pulmonary disease with (acute) exacerbation: Secondary | ICD-10-CM

## 2020-06-10 LAB — BLOOD GAS, ARTERIAL
Acid-Base Excess: 6.2 mmol/L — ABNORMAL HIGH (ref 0.0–2.0)
Bicarbonate: 32.1 mmol/L — ABNORMAL HIGH (ref 20.0–28.0)
FIO2: 60
MECHVT: 530 mL
O2 Saturation: 99.8 %
PEEP: 5 cmH2O
Patient temperature: 37
RATE: 14 resp/min
pCO2 arterial: 53 mmHg — ABNORMAL HIGH (ref 32.0–48.0)
pH, Arterial: 7.39 (ref 7.350–7.450)
pO2, Arterial: 230 mmHg — ABNORMAL HIGH (ref 83.0–108.0)

## 2020-06-10 LAB — CBC
HCT: 29 % — ABNORMAL LOW (ref 36.0–46.0)
Hemoglobin: 8.5 g/dL — ABNORMAL LOW (ref 12.0–15.0)
MCH: 30.2 pg (ref 26.0–34.0)
MCHC: 29.3 g/dL — ABNORMAL LOW (ref 30.0–36.0)
MCV: 103.2 fL — ABNORMAL HIGH (ref 80.0–100.0)
Platelets: 278 10*3/uL (ref 150–400)
RBC: 2.81 MIL/uL — ABNORMAL LOW (ref 3.87–5.11)
RDW: 14.1 % (ref 11.5–15.5)
WBC: 12.8 10*3/uL — ABNORMAL HIGH (ref 4.0–10.5)
nRBC: 0 % (ref 0.0–0.2)

## 2020-06-10 LAB — MAGNESIUM: Magnesium: 2.2 mg/dL (ref 1.7–2.4)

## 2020-06-10 LAB — HEPATIC FUNCTION PANEL
ALT: 25 U/L (ref 0–44)
AST: 23 U/L (ref 15–41)
Albumin: 3 g/dL — ABNORMAL LOW (ref 3.5–5.0)
Alkaline Phosphatase: 13 U/L — ABNORMAL LOW (ref 38–126)
Bilirubin, Direct: <0.1 mg/dL (ref 0.0–0.2)
Total Bilirubin: 0.5 mg/dL (ref 0.3–1.2)
Total Protein: 5.5 g/dL — ABNORMAL LOW (ref 6.5–8.1)

## 2020-06-10 LAB — TROPONIN I (HIGH SENSITIVITY)
Troponin I (High Sensitivity): 142 ng/L (ref <18)
Troponin I (High Sensitivity): 159 ng/L (ref <18)

## 2020-06-10 LAB — BASIC METABOLIC PANEL
Anion gap: 6 (ref 5–15)
BUN: 34 mg/dL — ABNORMAL HIGH (ref 8–23)
CO2: 32 mmol/L (ref 22–32)
Calcium: 9 mg/dL (ref 8.9–10.3)
Chloride: 105 mmol/L (ref 98–111)
Creatinine, Ser: 0.97 mg/dL (ref 0.44–1.00)
GFR, Estimated: >60 mL/min (ref >60)
Glucose, Bld: 123 mg/dL — ABNORMAL HIGH (ref 70–99)
Potassium: 4.9 mmol/L (ref 3.5–5.1)
Sodium: 143 mmol/L (ref 135–145)

## 2020-06-10 LAB — GLUCOSE, CAPILLARY
Glucose-Capillary: 84 mg/dL (ref 70–99)
Glucose-Capillary: 85 mg/dL (ref 70–99)
Glucose-Capillary: 84 mg/dL (ref 70–99)
Glucose-Capillary: 76 mg/dL (ref 70–99)
Glucose-Capillary: 94 mg/dL (ref 70–99)

## 2020-06-10 LAB — PHOSPHORUS: Phosphorus: 4.7 mg/dL — ABNORMAL HIGH (ref 2.5–4.6)

## 2020-06-10 LAB — TRIGLYCERIDES: Triglycerides: 176 mg/dL — ABNORMAL HIGH (ref <150)

## 2020-06-10 MED ORDER — SODIUM CHLORIDE 0.9% FLUSH
10.0000 mL | INTRAVENOUS | Status: DC | PRN
Start: 2020-06-10 — End: 2020-06-23
  Administered 2020-06-22: 10 mL

## 2020-06-10 MED ORDER — PANTOPRAZOLE SODIUM 40 MG IV SOLR
40.0000 mg | Freq: Every day | INTRAVENOUS | Status: DC
  Administered 2020-06-10 – 2020-06-15 (×6): 40 mg via INTRAVENOUS
  Filled 2020-06-10 (×5): qty 40

## 2020-06-10 MED ORDER — POLYETHYLENE GLYCOL 3350 17 G PO PACK
17.0000 g | PACK | Freq: Every day | ORAL | Status: DC
  Administered 2020-06-11 – 2020-06-16 (×3): 17 g
  Filled 2020-06-10 (×3): qty 1

## 2020-06-10 MED ORDER — VECURONIUM BROMIDE 10 MG IV SOLR: 10.0000 mg | Freq: Once | INTRAVENOUS | Status: AC

## 2020-06-10 MED ORDER — GABAPENTIN 300 MG PO CAPS
300.0000 mg | ORAL_CAPSULE | Freq: Three times a day (TID) | ORAL | Status: DC
  Administered 2020-06-11 – 2020-06-16 (×18): 300 mg
  Filled 2020-06-10 (×18): qty 1

## 2020-06-10 MED ORDER — INSULIN ASPART 100 UNIT/ML ~~LOC~~ SOLN
0.0000 [IU] | SUBCUTANEOUS | Status: DC
  Administered 2020-06-11 (×4): 2 [IU] via SUBCUTANEOUS
  Administered 2020-06-11: 1 [IU] via SUBCUTANEOUS
  Administered 2020-06-12: 2 [IU] via SUBCUTANEOUS
  Administered 2020-06-12 (×4): 1 [IU] via SUBCUTANEOUS
  Administered 2020-06-12: 2 [IU] via SUBCUTANEOUS
  Administered 2020-06-13 (×2): 1 [IU] via SUBCUTANEOUS
  Administered 2020-06-13: 2 [IU] via SUBCUTANEOUS
  Administered 2020-06-14: 1 [IU] via SUBCUTANEOUS
  Administered 2020-06-14: 2 [IU] via SUBCUTANEOUS
  Administered 2020-06-14 – 2020-06-16 (×4): 1 [IU] via SUBCUTANEOUS
  Administered 2020-06-16: 2 [IU] via SUBCUTANEOUS
  Administered 2020-06-16: 1 [IU] via SUBCUTANEOUS
  Administered 2020-06-16: 2 [IU] via SUBCUTANEOUS
  Administered 2020-06-17 – 2020-06-19 (×5): 1 [IU] via SUBCUTANEOUS
  Filled 2020-06-10 (×28): qty 1

## 2020-06-10 MED ORDER — FENTANYL CITRATE (PF) 100 MCG/2ML IJ SOLN
INTRAMUSCULAR | Status: AC
  Administered 2020-06-10: 100 ug via INTRAVENOUS
  Filled 2020-06-10: qty 4

## 2020-06-10 MED ORDER — SODIUM CHLORIDE 0.9% FLUSH
10.0000 mL | Freq: Two times a day (BID) | INTRAVENOUS | Status: DC
  Administered 2020-06-10: 15 mL
  Administered 2020-06-10 – 2020-06-11 (×2): 10 mL
  Administered 2020-06-11: 12 mL
  Administered 2020-06-12 – 2020-06-23 (×22): 10 mL

## 2020-06-10 MED ORDER — PROPOFOL 1000 MG/100ML IV EMUL
INTRAVENOUS | Status: AC
  Administered 2020-06-10: 5 ug/kg/min via INTRAVENOUS
  Filled 2020-06-10: qty 100

## 2020-06-10 MED ORDER — MIDAZOLAM HCL 2 MG/2ML IJ SOLN: 4.0000 mg | Freq: Once | INTRAMUSCULAR | Status: AC

## 2020-06-10 MED ORDER — NOREPINEPHRINE 4 MG/250ML-% IV SOLN: 0.0000 ug/min | INTRAVENOUS | Status: DC

## 2020-06-10 MED ORDER — VITAL AF 1.2 CAL PO LIQD: 1000.0000 mL | ORAL | Status: DC

## 2020-06-10 MED ORDER — QUETIAPINE FUMARATE 25 MG PO TABS
25.0000 mg | ORAL_TABLET | Freq: Every day | ORAL | Status: DC
  Administered 2020-06-12 – 2020-06-16 (×5): 25 mg
  Filled 2020-06-10 (×5): qty 1

## 2020-06-10 MED ORDER — VITAL AF 1.2 CAL PO LIQD
1000.0000 mL | ORAL | Status: AC
  Administered 2020-06-10 – 2020-06-14 (×4): 1000 mL

## 2020-06-10 MED ORDER — AMITRIPTYLINE HCL 25 MG PO TABS
25.0000 mg | ORAL_TABLET | Freq: Every day | ORAL | Status: DC
  Administered 2020-06-12 – 2020-06-16 (×5): 25 mg
  Filled 2020-06-10 (×9): qty 1

## 2020-06-10 MED ORDER — ACETAMINOPHEN 160 MG/5ML PO SOLN
650.0000 mg | ORAL | Status: DC | PRN
  Administered 2020-07-03 – 2020-08-01 (×12): 650 mg
  Filled 2020-06-10 (×19): qty 20.3

## 2020-06-10 MED ORDER — DIAZEPAM 2 MG PO TABS: 2.0000 mg | ORAL_TABLET | Freq: Three times a day (TID) | ORAL | Status: DC | PRN

## 2020-06-10 MED ORDER — FENTANYL 2500MCG IN NS 250ML (10MCG/ML) PREMIX INFUSION
0.0000 ug/h | INTRAVENOUS | Status: DC
  Administered 2020-06-11: 350 ug/h via INTRAVENOUS
  Administered 2020-06-11: 300 ug/h via INTRAVENOUS
  Administered 2020-06-11 – 2020-06-12 (×4): 350 ug/h via INTRAVENOUS
  Administered 2020-06-13: 250 ug/h via INTRAVENOUS
  Administered 2020-06-13 (×2): 350 ug/h via INTRAVENOUS
  Administered 2020-06-14 – 2020-06-15 (×3): 250 ug/h via INTRAVENOUS
  Administered 2020-06-15: 150 ug/h via INTRAVENOUS
  Administered 2020-06-15 – 2020-06-17 (×3): 250 ug/h via INTRAVENOUS
  Administered 2020-06-17: 150 ug/h via INTRAVENOUS
  Administered 2020-06-18: 50 ug/h via INTRAVENOUS
  Administered 2020-06-18: 200 ug/h via INTRAVENOUS
  Administered 2020-06-19 – 2020-06-22 (×13): 400 ug/h via INTRAVENOUS
  Filled 2020-06-10 (×33): qty 250

## 2020-06-10 MED ORDER — APIXABAN 5 MG PO TABS
5.0000 mg | ORAL_TABLET | Freq: Two times a day (BID) | ORAL | Status: DC
  Administered 2020-06-11 (×2): 5 mg
  Filled 2020-06-10 (×2): qty 1

## 2020-06-10 MED ORDER — FENTANYL 2500MCG IN NS 250ML (10MCG/ML) PREMIX INFUSION
INTRAVENOUS | Status: AC
  Administered 2020-06-10: 25 ug/h via INTRAVENOUS
  Filled 2020-06-10: qty 250

## 2020-06-10 MED ORDER — DEXMEDETOMIDINE HCL IN NACL 400 MCG/100ML IV SOLN
0.4000 ug/kg/h | INTRAVENOUS | Status: DC
  Administered 2020-06-10: 0.8 ug/kg/h via INTRAVENOUS
  Filled 2020-06-10: qty 100

## 2020-06-10 MED ORDER — SODIUM CHLORIDE 0.9 % IV SOLN
250.0000 mL | INTRAVENOUS | Status: DC
  Administered 2020-06-10: 250 mL via INTRAVENOUS

## 2020-06-10 MED ORDER — CHLORHEXIDINE GLUCONATE 0.12% ORAL RINSE (MEDLINE KIT)
15.0000 mL | Freq: Two times a day (BID) | OROMUCOSAL | Status: DC
  Administered 2020-06-10 – 2020-07-13 (×55): 15 mL via OROMUCOSAL
  Filled 2020-06-10: qty 15

## 2020-06-10 MED ORDER — DILTIAZEM HCL ER COATED BEADS 120 MG PO CP24
120.0000 mg | ORAL_CAPSULE | Freq: Every day | ORAL | Status: DC
  Administered 2020-06-10: 120 mg via ORAL
  Filled 2020-06-10 (×2): qty 1

## 2020-06-10 MED ORDER — NOREPINEPHRINE 4 MG/250ML-% IV SOLN
0.0000 ug/min | INTRAVENOUS | Status: DC
  Filled 2020-06-10: qty 250

## 2020-06-10 MED ORDER — METOPROLOL TARTRATE 50 MG PO TABS
50.0000 mg | ORAL_TABLET | Freq: Two times a day (BID) | ORAL | Status: DC
  Administered 2020-06-11 – 2020-06-15 (×9): 50 mg
  Filled 2020-06-10 (×9): qty 1

## 2020-06-10 MED ORDER — DOCUSATE SODIUM 50 MG/5ML PO LIQD
100.0000 mg | Freq: Two times a day (BID) | ORAL | Status: DC
  Administered 2020-06-11: 100 mg via ORAL
  Filled 2020-06-10: qty 10

## 2020-06-10 MED ORDER — MIDAZOLAM HCL 2 MG/2ML IJ SOLN
INTRAMUSCULAR | Status: AC
  Administered 2020-06-10: 4 mg via INTRAVENOUS
  Filled 2020-06-10: qty 4

## 2020-06-10 MED ORDER — FENTANYL CITRATE (PF) 100 MCG/2ML IJ SOLN: 100.0000 ug | Freq: Once | INTRAMUSCULAR | Status: AC

## 2020-06-10 MED ORDER — ORAL CARE MOUTH RINSE
15.0000 mL | OROMUCOSAL | Status: DC
  Administered 2020-06-10 – 2020-07-04 (×205): 15 mL via OROMUCOSAL

## 2020-06-10 MED ORDER — NOREPINEPHRINE 4 MG/250ML-% IV SOLN: 2.0000 ug/min | INTRAVENOUS | Status: DC

## 2020-06-10 MED ORDER — CALCIUM CARBONATE-VITAMIN D 500-200 MG-UNIT PO TABS
1.0000 | ORAL_TABLET | Freq: Every day | ORAL | Status: DC
  Administered 2020-06-11 – 2020-06-16 (×6): 1
  Filled 2020-06-10 (×6): qty 1

## 2020-06-10 MED ORDER — PROPOFOL 1000 MG/100ML IV EMUL
5.0000 ug/kg/min | INTRAVENOUS | Status: DC
  Administered 2020-06-10: 50 ug/kg/min via INTRAVENOUS
  Administered 2020-06-11: 70 ug/kg/min via INTRAVENOUS
  Administered 2020-06-11: 30 ug/kg/min via INTRAVENOUS
  Administered 2020-06-11: 80 ug/kg/min via INTRAVENOUS
  Administered 2020-06-11: 70 ug/kg/min via INTRAVENOUS
  Administered 2020-06-11: 65 ug/kg/min via INTRAVENOUS
  Administered 2020-06-11 (×2): 80 ug/kg/min via INTRAVENOUS
  Administered 2020-06-12: 60 ug/kg/min via INTRAVENOUS
  Administered 2020-06-12: 50 ug/kg/min via INTRAVENOUS
  Administered 2020-06-12: 60 ug/kg/min via INTRAVENOUS
  Administered 2020-06-12: 50 ug/kg/min via INTRAVENOUS
  Administered 2020-06-12 (×2): 60 ug/kg/min via INTRAVENOUS
  Administered 2020-06-13: 40 ug/kg/min via INTRAVENOUS
  Administered 2020-06-13: 30 ug/kg/min via INTRAVENOUS
  Administered 2020-06-13: 50 ug/kg/min via INTRAVENOUS
  Administered 2020-06-14: 30 ug/kg/min via INTRAVENOUS
  Filled 2020-06-10 (×18): qty 100

## 2020-06-10 MED ORDER — POLYETHYLENE GLYCOL 3350 17 G PO PACK
17.0000 g | PACK | Freq: Every day | ORAL | Status: DC | PRN
  Administered 2020-07-13: 17 g
  Filled 2020-06-10: qty 1

## 2020-06-10 MED ORDER — VECURONIUM BROMIDE 10 MG IV SOLR
INTRAVENOUS | Status: AC
  Administered 2020-06-10: 10 mg via INTRAVENOUS
  Filled 2020-06-10: qty 10

## 2020-06-10 NOTE — Procedures (Signed)
Endotracheal Intubation: Patient required placement of an artificial airway secondary to Respiratory Failure  Consent: Emergent.   Hand washing performed prior to starting the procedure.   Medications administered for sedation prior to procedure:  Midazolam 4 mg IV,  Vecuronium 10 mg IV, Fentanyl 100 mcg IV.    A time out procedure was called and correct patient, name, & ID confirmed. Needed supplies and equipment were assembled and checked to include ETT, 10 ml syringe, Glidescope, Mac and Miller blades, suction, oxygen and bag mask valve, end tidal CO2 monitor.   Patient was positioned to align the mouth and pharynx to facilitate visualization of the glottis.   Heart rate, SpO2 and blood pressure was continuously monitored during the procedure. Pre-oxygenation was conducted prior to intubation and endotracheal tube was placed through the vocal cords into the trachea.     The artificial airway was placed under direct visualization via glidescope route using a 7.5 ETT on the first attempt.  ETT was secured at 23 cm mark.  Placement was confirmed by auscuitation of lungs with good breath sounds bilaterally and no stomach sounds.  Condensation was noted on endotracheal tube.   Pulse ox 98%.  CO2 detector in place with appropriate color change.   Complications: None .   Operator: Judithe Keetch.   Chest radiograph ordered and pending.   Comments: OGT placed via glidescope.  Kache Mcclurg David Telitha Plath, M.D.  [facility]  Medical Director [facility] Medical Director [facility] Cardio-Pulmonary Department       

## 2020-06-10 NOTE — Consult Note (Signed)
PHARMACY CONSULT NOTE - FOLLOW UP  Pharmacy Consult for Electrolyte Monitoring and Replacement   Recent Labs: Potassium (mmol/L)  Date Value  06/10/2020 4.9   Magnesium (mg/dL)  Date Value  38/46/6599 2.2   Calcium (mg/dL)  Date Value  35/70/1779 9.0   Albumin (g/dL)  Date Value  39/08/90 3.0 (L)   Phosphorus (mg/dL)  Date Value  33/00/7622 4.7 (H)   Sodium (mmol/L)  Date Value  06/10/2020 143     Assessment: 69 yo female  admitted with acute on chronic hypoxic hypercapnic respiratory failure and acute encephalopathy secondary to narcotic use,  AECOPD requiring mechanical intubation, and acute kidney injury/failure.  12/16 Patient extubated and off tube feeds 12/17 Diet ordered, AKI resolving  Pt is on amio.    Tube Feeds: Vital high protein @ 40/hr >> changed to Vital AL 1.2 Cal @ 50/hr on 12/15 >> stopped 12/16  Bowel Regimen Docusate 100 mg BID + Miralax 17 g daily starting 12/15   Goal of Therapy:  Electrolytes WNL  Plan:   No replacement needed.   Recheck electrolytes with AM labs  Ronnald Ramp ,PharmD,BCPS Clinical Pharmacist 06/10/2020 8:22 AM

## 2020-06-10 NOTE — Procedures (Signed)
Central Venous Catheter Placement:TRIPLE LUMEN   Procedure: Insertion of Non-tunneled Central Venous Catheter(36556) with US guidance ([id number])   Indication(s) Medication administration and Difficult access  CVP monitoring Patient receiving vesicant or irritant drug.; Patient receiving intravenous therapy for longer than 5 days.; Patient has limited or no vascular access.    Consent Risks of the procedure as well as the alternatives and risks of each were explained to the patient and/or caregiver.  Consent for the procedure was obtained and is signed in the bedside chart  Consent:emergent   Anesthesia Topical only with 1% lidocaine    Timeout Verified patient identification, verified procedure, site/side was marked, verified correct patient position, special equipment/implants available, medications/allergies/relevant history reviewed, required imaging and test results available. Patient comfort was obtained.     Sterile Technique Maximal sterile technique including full sterile barrier drape, hand hygiene, sterile gown, sterile gloves, mask, hair covering, sterile ultrasound probe cover (if used).   Hand washing performed prior to starting the procedure.    Procedure Description Area of catheter insertion was cleaned with chlorhexidine and draped in sterile fashion.  With real-time ultrasound guidance a central venous catheter was placed into the right internal jugular vein. Nonpulsatile blood flow and easy flushing noted in all ports.  The catheter was sutured in place and sterile dressing applied.   A triple lumen catheter was placed in LEFT Internal Jugular Vein There was good blood return, catheter caps were placed on lumens, catheter flushed easily, the line was secured and a sterile dressing and BIO-PATCH applied.    Complications/Tolerance None; patient tolerated the procedure well. Chest X-ray is ordered to verify placement     EBL Minimal    Specimen(s) None   Number of Attempts: 1 Complications:none Estimated Blood Loss: none Chest Radiograph indicated and ordered.  Operator: Micheale Schlack.   Trevor Wilkie David Keitha Kolk, M.D.  [facility]  Medical Director ICU-[facility] Medical Director [facility] Cardio-Pulmonary Department    

## 2020-06-10 NOTE — Progress Notes (Signed)
GOALS OF CARE DISCUSSION  The Clinical status was relayed to family in [place] called to bedside  Updated and notified of patients medical condition.  Patient remains unresponsive and will not open eyes to command.    Patient is having a weak cough and struggling to remove secretions-I have explained to Son that Patient needs intubationSon [name] asked for patient to be placed on biPAP  Patient has expressed earlier that patient does NOT want biPAP  patient with increased WOB and using accessory muscles to breathe Explained to family course of therapy and the modalities     Patient with Progressive multiorgan failure with very low chance of meaningful recovery despite all aggressive and optimal medical therapy. Patient is Suffering and suffocating  Family understands the situation.   Family are satisfied with Plan of action and management. All questions answered  Additional CC time 32 mins   Harlo Jaso Santiago Glad, M.D.  [facility]  Medical Director [employer] [facility] Medical Director [facility] Cardio-Pulmonary Department

## 2020-06-10 NOTE — Progress Notes (Signed)
Patient's work of breathing is becoming more severe. Update given to daughter in law-son unavailable.  Spoke with Dr. Belia Heman regarding patient's status. Plan to place on bipap, start on precedex; if pt does not improve or tolerate plan will escalate to intubation. Called daughter in law back with update, she agrees with plan, will try to reach pt's son.   This RN explained the plan to patient, she agreeds.   This RN checked in with patient while preping IV precedex, pt is requesting intubation, states "I will breath better. Intubation." When asked if she would like to try bipap first. She stated, "Intubation."  1100- Pt continues to have increased work of breathing, more difficult to rouse. Desaturated to the 70s then 65s with hypertension. Dr. Belia Heman and RT called to the bedside. Son is in lobby of hospital, he requests we try bipap first.  Bipap with Precedex was applied to patient. Her work of breathing improved but she was still in distress. She was able to be alert enough to express through appropriate head nodding to her son, Dr. Belia Heman, and this RN that she still wanted to be intubated and understood it would most likely lead to her getting a trach again.   The decision was made to intubate the patient, place a central line and a foley catheter. Time out completed, no complications or issues.

## 2020-06-10 NOTE — Progress Notes (Signed)
CRITICAL CARE NOTE  69 yo female with severe COPD admitted with acute on chronic hypoxic hypercapnic respiratory failure and acute encephalopathy secondary to narcotic use and AECOPD requiring mechanical intubation  12/14 admitted to ICU for COPD exacerbation 12/15 plan for SAT/SBT 12/16 failed SAT/SBT, will try again today 12/16 s/p extubation to BiPAP 12/17 resp status still tenuous 12/18 severe resp failure high risk for intubation   CC  follow up respiratory failure severe COPD  HPI Patient remains critically ill Prognosis is guarded Patient wants intubation and refused BIPAP Now patient is obtunded waiting for family to come to bedside.     BP (!) 171/102   Pulse 87   Temp 98 F (36.7 C) (Oral)   Resp 19   Ht 5' 2.01" (1.575 m)   Wt 62.6 kg   SpO2 100%   BMI 25.24 kg/m    I/O last 3 completed shifts: In: 1362.9 [I.V.:1362.9] Out: 950 [Urine:950] Total I/O In: 50 [I.V.:50] Out: 1000 [Urine:1000]  SpO2: 100 % O2 Flow Rate (L/min): 4 L/min FiO2 (%): 24 %  Estimated body mass index is 25.24 kg/m as calculated from the following:   Height as of this encounter: 5' 2.01" (1.575 m).   Weight as of this encounter: 62.6 kg.  SIGNIFICANT EVENTS   REVIEW OF SYSTEMS  PATIENT IS UNABLE TO PROVIDE COMPLETE REVIEW OF SYSTEMS DUE TO SEVERE CRITICAL ILLNESS        PHYSICAL EXAMINATION:  GENERAL:critically ill appearing, +resp distress NECK: Supple.  PULMONARY: +rhonchi, +wheezing CARDIOVASCULAR: S1 and S2. Regular rate and rhythm. No murmurs, rubs, or gallops.  GASTROINTESTINAL: Soft, nontender, -distended.  Positive bowel sounds.   MUSCULOSKELETAL: No swelling, clubbing, or edema.  NEUROLOGIC: obtunded, GCS<8 SKIN:intact,warm,dry  MEDICATIONS: I have reviewed all medications and confirmed regimen as documented   CULTURE RESULTS   Recent Results (from the past 240 hour(s))  Resp Panel by RT-PCR (Flu A&B, Covid) Nasopharyngeal Swab     Status: None    Collection Time: 06/02/20  4:26 PM   Specimen: Nasopharyngeal Swab; Nasopharyngeal(NP) swabs in vial transport medium  Result Value Ref Range Status   SARS Coronavirus 2 by RT PCR NEGATIVE NEGATIVE Final    Comment: (NOTE) SARS-CoV-2 target nucleic acids are NOT DETECTED.  The SARS-CoV-2 RNA is generally detectable in upper respiratory specimens during the acute phase of infection. The lowest concentration of SARS-CoV-2 viral copies this assay can detect is 138 copies/mL. A negative result does not preclude SARS-Cov-2 infection and should not be used as the sole basis for treatment or other patient management decisions. A negative result may occur with  improper specimen collection/handling, submission of specimen other than nasopharyngeal swab, presence of viral mutation(s) within the areas targeted by this assay, and inadequate number of viral copies(<138 copies/mL). A negative result must be combined with clinical observations, patient history, and epidemiological information. The expected result is Negative.  Fact Sheet for Patients:  BloggerCourse.com  Fact Sheet for Healthcare Providers:  SeriousBroker.it  This test is no t yet approved or cleared by the [place] FDA and  has been authorized for detection and/or diagnosis of SARS-CoV-2 by FDA under an Emergency Use Authorization (EUA). This EUA will remain  in effect (meaning this test can be used) for the duration of the COVID-19 declaration under Section 564(b)(1) of the Act, 21 U.S.C.section 360bbb-3(b)(1), unless the authorization is terminated  or revoked sooner.       Influenza A by PCR NEGATIVE NEGATIVE Final   Influenza B by  PCR NEGATIVE NEGATIVE Final    Comment: (NOTE) The Xpert Xpress SARS-CoV-2/FLU/RSV plus assay is intended as an aid in the diagnosis of influenza from Nasopharyngeal swab specimens and should not be used as a sole basis for treatment.  Nasal washings and aspirates are unacceptable for Xpert Xpress SARS-CoV-2/FLU/RSV testing.  Fact Sheet for Patients: BloggerCourse.comhttps://www.fda.gov/media/152166/download  Fact Sheet for Healthcare Providers: SeriousBroker.ithttps://www.fda.gov/media/152162/download  This test is not yet approved or cleared by the [place] FDA and has been authorized for detection and/or diagnosis of SARS-CoV-2 by FDA under an Emergency Use Authorization (EUA). This EUA will remain in effect (meaning this test can be used) for the duration of the COVID-19 declaration under Section 564(b)(1) of the Act, 21 U.S.C. section 360bbb-3(b)(1), unless the authorization is terminated or revoked.  Performed at [facility], [address] [place], [place] [address]   Blood culture (routine x 2)     Status: None (Preliminary result)   Collection Time: 06/06/20 12:07 PM   Specimen: BLOOD  Result Value Ref Range Status   Specimen Description [facility]  Final   Special Requests   Final    BOTTLES DRAWN AEROBIC AND ANAEROBIC Blood Culture adequate volume   Culture   Final    NO GROWTH 4 DAYS Performed at [facility], [address] [place], [place] [address]    Report Status PENDING  Incomplete  Blood culture (routine x 2)     Status: None (Preliminary result)   Collection Time: 06/06/20 12:07 PM   Specimen: BLOOD  Result Value Ref Range Status   Specimen Description BLOOD LEFT AC  Final   Special Requests   Final    BOTTLES DRAWN AEROBIC AND ANAEROBIC Blood Culture adequate volume   Culture   Final    NO GROWTH 4 DAYS Performed at [facility], [address] [place], [place] [address]    Report Status PENDING  Incomplete  Resp Panel by RT-PCR (Flu A&B, Covid) Nasopharyngeal Swab     Status: None   Collection Time: 06/06/20 12:07 PM   Specimen: Nasopharyngeal Swab; Nasopharyngeal(NP) swabs in vial transport medium  Result Value Ref Range Status   SARS Coronavirus 2 by RT PCR NEGATIVE  NEGATIVE Final    Comment: (NOTE) SARS-CoV-2 target nucleic acids are NOT DETECTED.  The SARS-CoV-2 RNA is generally detectable in upper respiratory specimens during the acute phase of infection. The lowest concentration of SARS-CoV-2 viral copies this assay can detect is 138 copies/mL. A negative result does not preclude SARS-Cov-2 infection and should not be used as the sole basis for treatment or other patient management decisions. A negative result may occur with  improper specimen collection/handling, submission of specimen other than nasopharyngeal swab, presence of viral mutation(s) within the areas targeted by this assay, and inadequate number of viral copies(<138 copies/mL). A negative result must be combined with clinical observations, patient history, and epidemiological information. The expected result is Negative.  Fact Sheet for Patients:  BloggerCourse.comhttps://www.fda.gov/media/152166/download  Fact Sheet for Healthcare Providers:  SeriousBroker.ithttps://www.fda.gov/media/152162/download  This test is no t yet approved or cleared by the [place] FDA and  has been authorized for detection and/or diagnosis of SARS-CoV-2 by FDA under an Emergency Use Authorization (EUA). This EUA will remain  in effect (meaning this test can be used) for the duration of the COVID-19 declaration under Section 564(b)(1) of the Act, 21 U.S.C.section 360bbb-3(b)(1), unless the authorization is terminated  or revoked sooner.       Influenza A by  PCR NEGATIVE NEGATIVE Final   Influenza B by PCR NEGATIVE NEGATIVE Final    Comment: (NOTE) The Xpert Xpress SARS-CoV-2/FLU/RSV plus assay is intended as an aid in the diagnosis of influenza from Nasopharyngeal swab specimens and should not be used as a sole basis for treatment. Nasal washings and aspirates are unacceptable for Xpert Xpress SARS-CoV-2/FLU/RSV testing.  Fact Sheet for Patients: BloggerCourse.com  Fact Sheet for Healthcare  Providers: SeriousBroker.it  This test is not yet approved or cleared by the [place] FDA and has been authorized for detection and/or diagnosis of SARS-CoV-2 by FDA under an Emergency Use Authorization (EUA). This EUA will remain in effect (meaning this test can be used) for the duration of the COVID-19 declaration under Section 564(b)(1) of the Act, 21 U.S.C. section 360bbb-3(b)(1), unless the authorization is terminated or revoked.  Performed at [facility], [address] [place], [place] [address]   MRSA PCR Screening     Status: None   Collection Time: 06/06/20 11:00 PM   Specimen: Nasopharyngeal  Result Value Ref Range Status   MRSA by PCR NEGATIVE NEGATIVE Final    Comment:        The GeneXpert MRSA Assay (FDA approved for NASAL specimens only), is one component of a comprehensive MRSA colonization surveillance program. It is not intended to diagnose MRSA infection nor to guide or monitor treatment for MRSA infections. Performed at [facility], [address] [place], [place] [address]         CBC    Component Value Date/Time   WBC 12.8 (H) 06/10/2020 0422   RBC 2.81 (L) 06/10/2020 0422   HGB 8.5 (L) 06/10/2020 0422   HCT 29.0 (L) 06/10/2020 0422   PLT 278 06/10/2020 0422   MCV 103.2 (H) 06/10/2020 0422   MCH 30.2 06/10/2020 0422   MCHC 29.3 (L) 06/10/2020 0422   RDW 14.1 06/10/2020 0422   LYMPHSABS 0.5 (L) 06/06/2020 1207   MONOABS 0.5 06/06/2020 1207   EOSABS 0.0 06/06/2020 1207   BASOSABS 0.0 06/06/2020 1207   BMP Latest Ref Rng & Units 06/10/2020 06/09/2020 06/08/2020  Glucose 70 - 99 mg/dL 093(O) 671(I) 458(K)  BUN 8 - 23 mg/dL 99(I) 33(A) 25(K)  Creatinine 0.44 - 1.00 mg/dL 5.39 7.67 3.41(P)  Sodium 135 - 145 mmol/L 143 140 134(L)  Potassium 3.5 - 5.1 mmol/L 4.9 4.4 3.5  Chloride 98 - 111 mmol/L 105 104 99  CO2 22 - 32 mmol/L 32 29 28  Calcium 8.9 - 10.3 mg/dL 9.0 3.7(T) 7.8(L)       IMAGING    No results found.   Nutrition Status: Nutrition Problem: Increased nutrient needs Etiology: catabolic illness (COPD) Signs/Symptoms: estimated needs Interventions: Refer to RD note for recommendations     ASSESSMENT AND PLAN SYNOPSIS  69 yo female with acute COPD exacerbation leading to acute resp failure due to drug OD with narcotics S/p extubation 2 days ago complicated by acute aspiration of food last night with acute afib with RVR now with progressive resp wheezing and resp failure   Severe ACUTE Hypoxic and Hypercapnic Respiratory Failure High risk for intubation and death   ACUTE DIASTOLIC CARDIAC FAILURE-  -oxygen as needed -Lasix as tolerated   Obesity, possible OSA.   Will certainly impact respiratory mechanics     NEUROLOGY Acute toxic metabolic encephalopathy due to hypoxia and hypercapnia   CARDIAC ICU monitoring  ID -continue IV abx as prescibed -follow up cultures  GI GI PROPHYLAXIS as indicated  NUTRITIONAL  STATUS Nutrition Status: Nutrition Problem: Increased nutrient needs Etiology: catabolic illness (COPD) Signs/Symptoms: estimated needs Interventions: Refer to RD note for recommendations   DIET-->now NPO  Constipation protocol as indicated  ENDO - will use ICU hypoglycemic\Hyperglycemia protocol if indicated     ELECTROLYTES -follow labs as needed -replace as needed -pharmacy consultation and following   DVT/GI PRX ordered and assessed TRANSFUSIONS AS NEEDED MONITOR FSBS I Assessed the need for Labs I Assessed the need for Foley I Assessed the need for Central Venous Line Family Discussion when available I Assessed the need for Mobilization I made an Assessment of medications to be adjusted accordingly Safety Risk assessment completed    Critical Care Time devoted to patient care services described in this note is 55 minutes.   Overall, patient is critically ill, prognosis is guarded.   Patient with Multiorgan failure and at high risk for cardiac arrest and death.    Lucie Leather, M.D.  [facility]  Medical Director [facility] Medical Director [facility] Cardio-Pulmonary Department

## 2020-06-11 ENCOUNTER — Inpatient Hospital Stay: Payer: Medicare Other

## 2020-06-11 ENCOUNTER — Encounter: Payer: Self-pay | Admitting: Internal Medicine

## 2020-06-11 LAB — BASIC METABOLIC PANEL
Anion gap: 10 (ref 5–15)
BUN: 35 mg/dL — ABNORMAL HIGH (ref 8–23)
CO2: 29 mmol/L (ref 22–32)
Calcium: 9.4 mg/dL (ref 8.9–10.3)
Chloride: 103 mmol/L (ref 98–111)
Creatinine, Ser: 1.02 mg/dL — ABNORMAL HIGH (ref 0.44–1.00)
GFR, Estimated: 60 mL/min — ABNORMAL LOW (ref >60)
Glucose, Bld: 167 mg/dL — ABNORMAL HIGH (ref 70–99)
Potassium: 4.4 mmol/L (ref 3.5–5.1)
Sodium: 142 mmol/L (ref 135–145)

## 2020-06-11 LAB — CULTURE, BLOOD (ROUTINE X 2)
Culture: NO GROWTH
Culture: NO GROWTH
Special Requests: ADEQUATE
Special Requests: ADEQUATE

## 2020-06-11 LAB — PHOSPHORUS: Phosphorus: 3 mg/dL (ref 2.5–4.6)

## 2020-06-11 LAB — MAGNESIUM: Magnesium: 2 mg/dL (ref 1.7–2.4)

## 2020-06-11 LAB — GLUCOSE, CAPILLARY
Glucose-Capillary: 140 mg/dL — ABNORMAL HIGH (ref 70–99)
Glucose-Capillary: 161 mg/dL — ABNORMAL HIGH (ref 70–99)
Glucose-Capillary: 186 mg/dL — ABNORMAL HIGH (ref 70–99)
Glucose-Capillary: 172 mg/dL — ABNORMAL HIGH (ref 70–99)
Glucose-Capillary: 165 mg/dL — ABNORMAL HIGH (ref 70–99)
Glucose-Capillary: 146 mg/dL — ABNORMAL HIGH (ref 70–99)

## 2020-06-11 LAB — CBC
HCT: 26.8 % — ABNORMAL LOW (ref 36.0–46.0)
Hemoglobin: 8.2 g/dL — ABNORMAL LOW (ref 12.0–15.0)
MCH: 30.6 pg (ref 26.0–34.0)
MCHC: 30.6 g/dL (ref 30.0–36.0)
MCV: 100 fL (ref 80.0–100.0)
Platelets: 298 10*3/uL (ref 150–400)
RBC: 2.68 MIL/uL — ABNORMAL LOW (ref 3.87–5.11)
RDW: 14.3 % (ref 11.5–15.5)
WBC: 11.5 10*3/uL — ABNORMAL HIGH (ref 4.0–10.5)
nRBC: 0.2 % (ref 0.0–0.2)

## 2020-06-11 MED ORDER — DIAZEPAM 5 MG PO TABS
5.0000 mg | ORAL_TABLET | Freq: Three times a day (TID) | ORAL | Status: DC | PRN
  Administered 2020-06-11 – 2020-06-22 (×5): 5 mg
  Filled 2020-06-11 (×5): qty 1

## 2020-06-11 MED ORDER — NOREPINEPHRINE 16 MG/250ML-% IV SOLN
0.0000 ug/min | INTRAVENOUS | Status: DC
  Administered 2020-06-11: 2 ug/min via INTRAVENOUS
  Filled 2020-06-11 (×2): qty 250

## 2020-06-11 MED ORDER — CHLORHEXIDINE GLUCONATE 0.12 % MT SOLN
OROMUCOSAL | Status: AC
  Filled 2020-06-11: qty 15

## 2020-06-11 MED ORDER — DILTIAZEM HCL 30 MG PO TABS
30.0000 mg | ORAL_TABLET | Freq: Four times a day (QID) | ORAL | Status: DC
  Administered 2020-06-12 – 2020-06-17 (×20): 30 mg
  Filled 2020-06-11 (×20): qty 1

## 2020-06-11 MED ORDER — DOCUSATE SODIUM 50 MG/5ML PO LIQD
100.0000 mg | Freq: Two times a day (BID) | ORAL | Status: DC
  Administered 2020-06-12 – 2020-06-16 (×4): 100 mg
  Filled 2020-06-11 (×3): qty 10

## 2020-06-11 MED ORDER — DILTIAZEM HCL 30 MG PO TABS
30.0000 mg | ORAL_TABLET | Freq: Four times a day (QID) | ORAL | Status: DC
  Administered 2020-06-11 (×2): 30 mg via ORAL
  Filled 2020-06-11 (×2): qty 1

## 2020-06-11 NOTE — Progress Notes (Signed)
GOALS OF CARE DISCUSSION  The Clinical status was relayed to family in detail. Husband at bedside  Updated and notified of patients medical condition.  Upon assessment his breath sounds are course crackles with significant secretions to oral pharyngeal region. Nasopharyngeal suction produced copious sanguineous secretions.   Patient is having a weak cough and struggling to remove secretions. patient with increased WOB and using accessory muscles to breathe Explained to family course of therapy and the modalities  Patient has failed multiple failed weaning attempts Plan for [facility]-ENT to be  Consulted   Family understands the situation.   Family are satisfied with Plan of action and management. All questions answered  Additional CC time 32 mins   Gaspard Isbell David Anglea Gordner, M.D.  [facility]  Medical Director ICU-[facility] Medical Director [facility] Cardio-Pulmonary Department  

## 2020-06-11 NOTE — Progress Notes (Signed)
Neuro: stable on sedation, followed commands with wake up assessment  Resp: stable on vent settings, FiO2 decreased to 24% CV: afebrile, warm and well perfused, vital signs stable on sedation and norepi GIGU: foley in place, tolerating feeds well, no BM Skin: upper extremity edema, bruising on bilateral upper extremities Social: Son came to visit this evening, all questions and concerns addressed, this RN continued with COPD education-this is not a reversible disease  Events: No events or changes.

## 2020-06-11 NOTE — Progress Notes (Signed)
CRITICAL CARE NOTE 69 yo female with severe COPD admitted with acute on chronic hypoxic hypercapnic respiratory failure and acute encephalopathy secondary to narcotic use and AECOPD requiring mechanical intubation  12/14 admitted to ICU for COPD exacerbation 12/15 plan for SAT/SBT 12/16 failed SAT/SBT, will try again today 12/16 s/p extubation to BiPAP 12/17 resp status still tenuous 12/18 severe resp failure high risk for intubation 12/18 Re intubated for severe COPD 12/19 severe resp  Failure, ENT consulted for trach   CC  follow up respiratory failure  SUBJECTIVE Patient remains critically ill Prognosis is guarded    BP (!) 145/90 (BP Location: Right Arm)   Pulse 88   Temp (!) 96.98 F (36.1 C) (Esophageal)   Resp (!) 22   Ht 5' 2.01" (1.575 m)   Wt 62.6 kg   SpO2 99%   BMI 25.24 kg/m    I/O last 3 completed shifts: In: 1437.3 [I.V.:1288.6; NG/GT:148.7] Out: 2825 [Urine:2825] Total I/O In: 137.9 [I.V.:72.9; NG/GT:65] Out: 115 [Urine:115]  SpO2: 99 % O2 Flow Rate (L/min): 4 L/min FiO2 (%): 30 %  Estimated body mass index is 25.24 kg/m as calculated from the following:   Height as of this encounter: 5' 2.01" (1.575 m).   Weight as of this encounter: 62.6 kg.  SIGNIFICANT EVENTS   REVIEW OF SYSTEMS  PATIENT IS UNABLE TO PROVIDE COMPLETE REVIEW OF SYSTEMS DUE TO SEVERE CRITICAL ILLNESS        PHYSICAL EXAMINATION:  GENERAL:critically ill appearing, +resp distress HEAD: Normocephalic, atraumatic.  EYES: Pupils equal, round, reactive to light.  No scleral icterus.  MOUTH: Moist mucosal membrane. NECK: Supple.  PULMONARY: +rhonchi, +wheezing CARDIOVASCULAR: S1 and S2. Regular rate and rhythm. No murmurs, rubs, or gallops.  GASTROINTESTINAL: Soft, nontender, -distended.  Positive bowel sounds.   MUSCULOSKELETAL: No swelling, clubbing, or edema.  NEUROLOGIC: obtunded, GCS<8 SKIN:intact,warm,dry  MEDICATIONS: I have reviewed all medications and  confirmed regimen as documented   CULTURE RESULTS   Recent Results (from the past 240 hour(s))  Resp Panel by RT-PCR (Flu A&B, Covid) Nasopharyngeal Swab     Status: None   Collection Time: 06/02/20  4:26 PM   Specimen: Nasopharyngeal Swab; Nasopharyngeal(NP) swabs in vial transport medium  Result Value Ref Range Status   SARS Coronavirus 2 by RT PCR NEGATIVE NEGATIVE Final    Comment: (NOTE) SARS-CoV-2 target nucleic acids are NOT DETECTED.  The SARS-CoV-2 RNA is generally detectable in upper respiratory specimens during the acute phase of infection. The lowest concentration of SARS-CoV-2 viral copies this assay can detect is 138 copies/mL. A negative result does not preclude SARS-Cov-2 infection and should not be used as the sole basis for treatment or other patient management decisions. A negative result may occur with  improper specimen collection/handling, submission of specimen other than nasopharyngeal swab, presence of viral mutation(s) within the areas targeted by this assay, and inadequate number of viral copies(<138 copies/mL). A negative result must be combined with clinical observations, patient history, and epidemiological information. The expected result is Negative.  Fact Sheet for Patients:  BloggerCourse.com  Fact Sheet for Healthcare Providers:  SeriousBroker.it  This test is no t yet approved or cleared by the [place] FDA and  has been authorized for detection and/or diagnosis of SARS-CoV-2 by FDA under an Emergency Use Authorization (EUA). This EUA will remain  in effect (meaning this test can be used) for the duration of the COVID-19 declaration under Section 564(b)(1) of the Act, 21 U.S.C.section 360bbb-3(b)(1), unless the authorization is terminated  or revoked sooner.       Influenza A by PCR NEGATIVE NEGATIVE Final   Influenza B by PCR NEGATIVE NEGATIVE Final    Comment: (NOTE) The Xpert  Xpress SARS-CoV-2/FLU/RSV plus assay is intended as an aid in the diagnosis of influenza from Nasopharyngeal swab specimens and should not be used as a sole basis for treatment. Nasal washings and aspirates are unacceptable for Xpert Xpress SARS-CoV-2/FLU/RSV testing.  Fact Sheet for Patients: BloggerCourse.com  Fact Sheet for Healthcare Providers: SeriousBroker.it  This test is not yet approved or cleared by the [place] FDA and has been authorized for detection and/or diagnosis of SARS-CoV-2 by FDA under an Emergency Use Authorization (EUA). This EUA will remain in effect (meaning this test can be used) for the duration of the COVID-19 declaration under Section 564(b)(1) of the Act, 21 U.S.C. section 360bbb-3(b)(1), unless the authorization is terminated or revoked.  Performed at [facility], [address] [place], [place] [address]   Blood culture (routine x 2)     Status: None   Collection Time: 06/06/20 12:07 PM   Specimen: BLOOD  Result Value Ref Range Status   Specimen Description BLOOD RIGHT [facility]  Final   Special Requests   Final    BOTTLES DRAWN AEROBIC AND ANAEROBIC Blood Culture adequate volume   Culture   Final    NO GROWTH 5 DAYS Performed at [facility], [address] [place], [place] [address]    Report Status 06/11/2020 FINAL  Final  Blood culture (routine x 2)     Status: None   Collection Time: 06/06/20 12:07 PM   Specimen: BLOOD  Result Value Ref Range Status   Specimen Description BLOOD LEFT AC  Final   Special Requests   Final    BOTTLES DRAWN AEROBIC AND ANAEROBIC Blood Culture adequate volume   Culture   Final    NO GROWTH 5 DAYS Performed at [facility], [address] [place], [place] [address]    Report Status 06/11/2020 FINAL  Final  Resp Panel by RT-PCR (Flu A&B, Covid) Nasopharyngeal Swab     Status: None   Collection Time: 06/06/20 12:07 PM    Specimen: Nasopharyngeal Swab; Nasopharyngeal(NP) swabs in vial transport medium  Result Value Ref Range Status   SARS Coronavirus 2 by RT PCR NEGATIVE NEGATIVE Final    Comment: (NOTE) SARS-CoV-2 target nucleic acids are NOT DETECTED.  The SARS-CoV-2 RNA is generally detectable in upper respiratory specimens during the acute phase of infection. The lowest concentration of SARS-CoV-2 viral copies this assay can detect is 138 copies/mL. A negative result does not preclude SARS-Cov-2 infection and should not be used as the sole basis for treatment or other patient management decisions. A negative result may occur with  improper specimen collection/handling, submission of specimen other than nasopharyngeal swab, presence of viral mutation(s) within the areas targeted by this assay, and inadequate number of viral copies(<138 copies/mL). A negative result must be combined with clinical observations, patient history, and epidemiological information. The expected result is Negative.  Fact Sheet for Patients:  BloggerCourse.com  Fact Sheet for Healthcare Providers:  SeriousBroker.it  This test is no t yet approved or cleared by the [place] FDA and  has been authorized for detection and/or diagnosis of SARS-CoV-2 by FDA under an Emergency Use Authorization (EUA). This EUA will remain  in effect (meaning this test can be used) for the duration of the COVID-19 declaration under Section 564(b)(1) of the Act, 21 U.S.C.section  360bbb-3(b)(1), unless the authorization is terminated  or revoked sooner.       Influenza A by PCR NEGATIVE NEGATIVE Final   Influenza B by PCR NEGATIVE NEGATIVE Final    Comment: (NOTE) The Xpert Xpress SARS-CoV-2/FLU/RSV plus assay is intended as an aid in the diagnosis of influenza from Nasopharyngeal swab specimens and should not be used as a sole basis for treatment. Nasal washings and aspirates are  unacceptable for Xpert Xpress SARS-CoV-2/FLU/RSV testing.  Fact Sheet for Patients: BloggerCourse.com  Fact Sheet for Healthcare Providers: SeriousBroker.it  This test is not yet approved or cleared by the [place] FDA and has been authorized for detection and/or diagnosis of SARS-CoV-2 by FDA under an Emergency Use Authorization (EUA). This EUA will remain in effect (meaning this test can be used) for the duration of the COVID-19 declaration under Section 564(b)(1) of the Act, 21 U.S.C. section 360bbb-3(b)(1), unless the authorization is terminated or revoked.  Performed at [facility], [address] [place], [place] [address]   MRSA PCR Screening     Status: None   Collection Time: 06/06/20 11:00 PM   Specimen: Nasopharyngeal  Result Value Ref Range Status   MRSA by PCR NEGATIVE NEGATIVE Final    Comment:        The GeneXpert MRSA Assay (FDA approved for NASAL specimens only), is one component of a comprehensive MRSA colonization surveillance program. It is not intended to diagnose MRSA infection nor to guide or monitor treatment for MRSA infections. Performed at [facility], [address] [place], [place] [address]           IMAGING    DG Abd 1 View  Result Date: 06/10/2020 CLINICAL DATA:  Status post NG tube placement. EXAM: ABDOMEN - 1 VIEW COMPARISON:  None. FINDINGS: NG tube tip is in the stomach with the side port at the gastroesophageal junction. Recommend advancement of 3 cm. IMPRESSION: As above. Electronically Signed   By: Drusilla Kanner M.D.   On: 06/10/2020 15:06   DG Chest Port 1 View  Result Date: 06/11/2020 CLINICAL DATA:  Acute respiratory failure. Endotracheally intubated. EXAM: PORTABLE CHEST 1 VIEW COMPARISON:  06/10/2020 FINDINGS: Support lines and tubes in appropriate position. Heart size is within normal limits. Tortuosity of thoracic aorta again noted.  Pulmonary hyperinflation is again seen. No evidence of acute infiltrate or pleural effusion. IMPRESSION: Pulmonary hyperinflation.  No active lung disease. Electronically Signed   By: Danae Orleans M.D.   On: 06/11/2020 09:09   DG Chest Port 1 View  Result Date: 06/10/2020 CLINICAL DATA:  Status post intubation and central line placement. EXAM: PORTABLE CHEST 1 VIEW COMPARISON:  Single-view of the chest 06/06/2020. FINDINGS: Endotracheal tube is in place with the tip 2.6 cm above the carina. Left IJ central venous catheter tip is in the upper superior vena cava. NG tube courses into the stomach and below the inferior margin of the film. Lungs are clear. No pneumothorax or pleural effusion. Heart size is normal. Aortic atherosclerosis. IMPRESSION: ETT in good position. Tip of right IJ catheter is in the upper superior vena cava. No pneumothorax. No acute cardiopulmonary disease. Electronically Signed   By: Drusilla Kanner M.D.   On: 06/10/2020 15:05     Nutrition Status: Nutrition Problem: Increased nutrient needs Etiology: catabolic illness (COPD) Signs/Symptoms: estimated needs Interventions: Refer to RD note for recommendations  CBC    Component Value Date/Time   WBC 11.5 (H) 06/11/2020 0455   RBC 2.68 (L) 06/11/2020  0455   HGB 8.2 (L) 06/11/2020 0455   HCT 26.8 (L) 06/11/2020 0455   PLT 298 06/11/2020 0455   MCV 100.0 06/11/2020 0455   MCH 30.6 06/11/2020 0455   MCHC 30.6 06/11/2020 0455   RDW 14.3 06/11/2020 0455   LYMPHSABS 0.5 (L) 06/06/2020 1207   MONOABS 0.5 06/06/2020 1207   EOSABS 0.0 06/06/2020 1207   BASOSABS 0.0 06/06/2020 1207   BMP Latest Ref Rng & Units 06/11/2020 06/10/2020 06/09/2020  Glucose 70 - 99 mg/dL 161(W167(H) 960(A123(H) 540(J137(H)  BUN 8 - 23 mg/dL 81(X35(H) 91(Y34(H) 78(G38(H)  Creatinine 0.44 - 1.00 mg/dL 9.56(O1.02(H) 1.300.97 8.650.97  Sodium 135 - 145 mmol/L 142 143 140  Potassium 3.5 - 5.1 mmol/L 4.4 4.9 4.4  Chloride 98 - 111 mmol/L 103 105 104  CO2 22 - 32 mmol/L 29 32 29  Calcium  8.9 - 10.3 mg/dL 9.4 9.0 7.8(I8.3(L)      Indwelling Urinary Catheter continued, requirement due to   Reason to continue Indwelling Urinary Catheter strict Intake/Output monitoring for hemodynamic instability   Central Line/ continued, requirement due to  Reason to continue [employer] Line Monitoring of central venous pressure or other hemodynamic parameters and poor IV access   Ventilator continued, requirement due to severe respiratory failure   Ventilator Sedation RASS 0 to -2      ASSESSMENT AND PLAN SYNOPSIS  69 yo female with acute COPD exacerbation leading to acute resp failure due to drug OD with narcotics S/p extubation 2 days ago complicated by acute aspiration of food last night with acute afib with RVR now with progressive resp wheezing and resp failure which then led to severe resp distress leading to re-intubation after failing trial of biPAP   Severe ACUTE Hypoxic and Hypercapnic Respiratory Failure -continue Full MV support -continue Bronchodilator Therapy -Wean Fio2 and PEEP as tolerated -VAP/VENT bundle implementation Patient will need Trach to Surviva  ACUTE DIASTOLIC CARDIAC FAILURE-  -oxygen as needed -Lasix as tolerated    NEUROLOGY - intubated and sedated - minimal sedation to achieve a RASS goal: -1  CARDIAC ICU monitoring  ID -continue IV abx as prescibed -follow up cultures Antibiotics Given (last 72 hours)    None       GI GI PROPHYLAXIS as indicated  NUTRITIONAL STATUS Nutrition Status: Nutrition Problem: Increased nutrient needs Etiology: catabolic illness (COPD) Signs/Symptoms: estimated needs Interventions: Refer to RD note for recommendations   DIET-->TF's as tolerated Constipation protocol as indicated  ENDO - will use ICU hypoglycemic\Hyperglycemia protocol if indicated     ELECTROLYTES -follow labs as needed -replace as needed -pharmacy consultation and following   DVT/GI PRX ordered and assessed TRANSFUSIONS AS  NEEDED MONITOR FSBS I Assessed the need for Labs I Assessed the need for Foley I Assessed the need for Central Venous Line Family Discussion when available I Assessed the need for Mobilization I made an Assessment of medications to be adjusted accordingly Safety Risk assessment completed   CASE DISCUSSED IN MULTIDISCIPLINARY ROUNDS WITH ICU TEAM  Critical Care Time devoted to patient care services described in this note is 56 minutes.   Overall, patient is critically ill, prognosis is guarded.  Patient with Multiorgan failure and at high risk for cardiac arrest and death.    Lucie LeatherKurian David Chelsie Burel, M.D.  [facility]  Medical Director [facility] Medical Director [facility]

## 2020-06-11 NOTE — Progress Notes (Deleted)
[place]  [phone] [phone]  ID: Virginia [name] OB: May 21, 1951  MR#: [id number]  LTJ#:[id number]  Patient Care Team: Idelle Crouch, MD as PCP - General (Internal Medicine)   CHIEF COMPLAINT: Iron deficiency anemia.  INTERVAL HISTORY: Patient returns to clinic today for repeat laboratory, further evaluation, and consideration of additional IV Feraheme.  She continues to have chronic weakness and fatigue.  She also has chronic shortness of breath and requires oxygen 24 hours/day.  She otherwise feels well. She has no neurologic complaints.  She denies any recent fevers or illnesses.  She has a good appetite and denies weight loss.  She has no chest pain, cough, or hemoptysis.  She denies any nausea, vomiting, constipation, or diarrhea.  She has no melena or hematochezia.  She has no urinary complaints.  Patient offers no further specific complaints today.  REVIEW OF SYSTEMS:   Review of Systems  Constitutional: Positive for malaise/fatigue. Negative for fever and weight loss.  Respiratory: Positive for shortness of breath. Negative for cough and hemoptysis.   Cardiovascular: Negative.  Negative for chest pain and leg swelling.  Gastrointestinal: Negative.  Negative for abdominal pain, blood in stool and melena.  Genitourinary: Negative.  Negative for hematuria.  Musculoskeletal: Negative.  Negative for back pain.  Skin: Negative.  Negative for rash.  Neurological: Positive for weakness. Negative for dizziness, focal weakness and headaches.  Psychiatric/Behavioral: Negative.  The patient is not nervous/anxious.     As per HPI. Otherwise, a complete review of systems is negative.  PAST MEDICAL HISTORY: Past Medical History:  Diagnosis Date  . Anemia   . Anxiety   . Arthritis   . Atrial fibrillation ([place])   . C. difficile colitis 09/2015  . COPD (chronic obstructive pulmonary disease) ([place])   . Depression   . Dyspnea   .  Dysrhythmia   . Fibromyalgia   . H/O tracheostomy   . Hep C w/ coma, chronic   . Hypertension   . MRSA pneumonia ([place]) 2017  . On home oxygen therapy    3 L/M   . Osteoporosis   . Peripheral neuropathy   . RLS (restless legs syndrome)   . S/P percutaneous endoscopic gastrostomy (PEG) tube placement ([place]) 09/2015  . Ventilator associated pneumonia [facility]) 10/2015   [facility], [place]    PAST SURGICAL HISTORY: Past Surgical History:  Procedure Laterality Date  . ABDOMINAL HYSTERECTOMY    . BREAST SURGERY Bilateral    Breast Implants  . DILATION AND CURETTAGE OF UTERUS    . REVERSE SHOULDER ARTHROPLASTY Right 03/06/2017   Procedure: REVERSE SHOULDER ARTHROPLASTY;  Surgeon: Corky Mull, MD;  Location: [facility] ORS;  Service: Orthopedics;  Laterality: Right;  . ROTATOR CUFF REPAIR Bilateral     FAMILY HISTORY: Family History  Problem Relation Age of Onset  . Hypertension Mother     ADVANCED DIRECTIVES (Y/N):  N  HEALTH MAINTENANCE: Social History   Tobacco Use  . Smoking status: Former Smoker    Packs/day: 1.50    Types: Cigarettes    Quit date: 10/05/2015    Years since quitting: 4.6  . Smokeless tobacco: Never Used  Vaping Use  . Vaping Use: Some days  . Last attempt to quit: 10/05/2015  Substance Use Topics  . Alcohol use: No  . Drug use: No     Colonoscopy:  PAP:  Bone density:  Lipid panel:  No Known Allergies  No current facility-administered medications for this visit.  No current outpatient medications on file.   Facility-Administered Medications Ordered in Other Visits  Medication Dose Route Frequency Provider Last Rate Last Admin  . 0.9 %  sodium chloride infusion  250 mL Intravenous Continuous Duffy Bruce, MD 10 mL/hr at 06/10/20 1700 Infusion Verify at 06/10/20 1700  . 0.9 %  sodium chloride infusion  250 mL Intravenous PRN Flora Lipps, MD      . 0.9 %  sodium chloride infusion  250 mL Intravenous Continuous Flora Lipps, MD 10 mL/hr  at 06/11/20 0800 Infusion Verify at 06/11/20 0800  . acetaminophen (TYLENOL) 160 MG/5ML solution 650 mg  650 mg Per Tube Q4H PRN Flora Lipps, MD      . amiodarone (NEXTERONE PREMIX) 360-4.14 MG/200ML-% (1.8 mg/mL) IV infusion  30 mg/hr Intravenous Continuous Flora Lipps, MD   Stopped at 06/10/20 1127  . amitriptyline (ELAVIL) tablet 25 mg  25 mg Per Tube QHS Flora Lipps, MD      . apixaban (ELIQUIS) tablet 5 mg  5 mg Per Tube BID Flora Lipps, MD      . budesonide (PULMICORT) nebulizer solution 0.5 mg  0.5 mg Nebulization BID Awilda Bill, NP   0.5 mg at 06/10/20 2038  . calcium-vitamin D (OSCAL WITH D) 500-200 MG-UNIT per tablet 1 tablet  1 tablet Per Tube Q breakfast Flora Lipps, MD   1 tablet at 06/11/20 0753  . chlorhexidine (PERIDEX) 0.12 % solution           . chlorhexidine gluconate (MEDLINE KIT) (PERIDEX) 0.12 % solution 15 mL  15 mL Mouth Rinse BID Flora Lipps, MD   15 mL at 06/11/20 0845  . Chlorhexidine Gluconate Cloth 2 % PADS 6 each  6 each Topical [name] Flora Lipps, MD   6 each at 06/09/20 1000  . diazepam (VALIUM) tablet 5 mg  5 mg Per Tube Q8H PRN Rust-Chester, Toribio Harbour L, NP   5 mg at 06/11/20 0523  . diltiazem (CARDIZEM CD) 24 hr capsule 120 mg  120 mg Oral [name] Flora Lipps, MD   120 mg at 06/10/20 0272  . docusate (COLACE) 50 MG/5ML liquid 100 mg  100 mg Oral BID Flora Lipps, MD      . feeding supplement (VITAL AF 1.2 CAL) liquid 1,000 mL  1,000 mL Per Tube Continuous Rust-Chester, Britton L, NP 35 mL/hr at 06/11/20 0800 Rate Change at 06/11/20 0800  . fentaNYL 2561mg in NS 2517m(1042mml) infusion-PREMIX  0-400 mcg/hr Intravenous Continuous KasFlora LippsD 30 mL/hr at 06/11/20 0800 300 mcg/hr at 06/11/20 0800  . gabapentin (NEURONTIN) capsule 300 mg  300 mg Per Tube TID KasFlora LippsD      . insulin aspart (novoLOG) injection 0-9 Units  0-9 Units Subcutaneous Q4H Rust-Chester, BriHuel CoteP   2 Units at 06/11/20 0752  . ipratropium-albuterol (DUONEB) 0.5-2.5 (3)  MG/3ML nebulizer solution 3 mL  3 mL Nebulization Q6H BlaAwilda BillP   3 mL at 06/11/20 0206  . ipratropium-albuterol (DUONEB) 0.5-2.5 (3) MG/3ML nebulizer solution 3 mL  3 mL Nebulization Q6H PRN BlaAwilda BillP      . MEDLINE mouth rinse  15 mL Mouth Rinse 10 times per day KasFlora LippsD   15 mL at 06/11/20 0538  . methylPREDNISolone sodium succinate (SOLU-MEDROL) 40 mg/mL injection 40 mg  40 mg Intravenous Q12H BlaAwilda BillP   40 mg at 06/11/20 0753  . metoprolol tartrate (LOPRESSOR) tablet 50 mg  50 mg Per Tube BID Kasa,  Maretta Bees, MD      . norepinephrine (LEVOPHED) 16 mg in 228m premix infusion  0-40 mcg/min Intravenous Titrated Rust-Chester, Britton L, NP 2.81 mL/hr at 06/11/20 0800 3 mcg/min at 06/11/20 0800  . ondansetron (ZOFRAN) injection 4 mg  4 mg Intravenous Q6H PRN KFlora Lipps MD      . pantoprazole (PROTONIX) injection 40 mg  40 mg Intravenous QHS KFlora Lipps MD   40 mg at 06/10/20 2225  . polyethylene glycol (MIRALAX / GLYCOLAX) packet 17 g  17 g Per Tube [name] PRN KFlora Lipps MD      . polyethylene glycol (MIRALAX / GLYCOLAX) packet 17 g  17 g Per Tube [name] Kasa, Kurian, MD      . propofol (DIPRIVAN) 1000 MG/100ML infusion  5-80 mcg/kg/min Intravenous Titrated KFlora Lipps MD 30 mL/hr at 06/11/20 0819 80 mcg/kg/min at 06/11/20 0819  . QUEtiapine (SEROQUEL) tablet 25 mg  25 mg Per Tube QHS KFlora Lipps MD      . sodium chloride flush (NS) 0.9 % injection 10-40 mL  10-40 mL Intracatheter Q12H KFlora Lipps MD   10 mL at 06/10/20 2228  . sodium chloride flush (NS) 0.9 % injection 10-40 mL  10-40 mL Intracatheter PRN KFlora Lipps MD      . sodium chloride flush (NS) 0.9 % injection 3 mL  3 mL Intravenous Q12H KFlora Lipps MD   3 mL at 06/10/20 2229  . sodium chloride flush (NS) 0.9 % injection 3 mL  3 mL Intravenous PRN KFlora Lipps MD        OBJECTIVE: There were no vitals filed for this visit.   There is no height or weight on file to calculate BMI.     ECOG FS:1 - Symptomatic but completely ambulatory  General: Well-developed, well-nourished, no acute distress.  Sitting in a wheelchair. Eyes: Pink conjunctiva, anicteric sclera. HEENT: Normocephalic, moist mucous membranes. Lungs: No audible wheezing or coughing. Heart: Regular rate and rhythm. Abdomen: Soft, nontender, no obvious distention. Musculoskeletal: No edema, cyanosis, or clubbing. Neuro: Alert, answering all questions appropriately. Cranial nerves grossly intact. Skin: No rashes or petechiae noted. Psych: Normal affect.   LAB RESULTS:  Lab Results  Component Value Date   NA 142 06/11/2020   K 4.4 06/11/2020   CL 103 06/11/2020   CO2 29 06/11/2020   GLUCOSE 167 (H) 06/11/2020   BUN 35 (H) 06/11/2020   CREATININE 1.02 (H) 06/11/2020   CALCIUM 9.4 06/11/2020   PROT 5.5 (L) 06/10/2020   ALBUMIN 3.0 (L) 06/10/2020   AST 23 06/10/2020   ALT 25 06/10/2020   ALKPHOS 13 (L) 06/10/2020   BILITOT 0.5 06/10/2020   GFRNONAA 60 (L) 06/11/2020   GFRAA >60 04/20/2019    Lab Results  Component Value Date   WBC 11.5 (H) 06/11/2020   NEUTROABS 12.8 (H) 06/06/2020   HGB 8.2 (L) 06/11/2020   HCT 26.8 (L) 06/11/2020   MCV 100.0 06/11/2020   PLT 298 06/11/2020   Lab Results  Component Value Date   IRON 67 01/20/2020   TIBC 216 (L) 01/20/2020   IRONPCTSAT 31 01/20/2020   Lab Results  Component Value Date   FERRITIN 300 01/20/2020     STUDIES: DG Chest 2 View  Result Date: 06/02/2020 CLINICAL DATA:  Shortness of breath since this morning, expiratory wheezing, COPD, atrial fibrillation, hypertension EXAM: CHEST - 2 VIEW COMPARISON:  04/20/2019 Correlation: CT chest 07/30/2017 FINDINGS: Normal heart size, mediastinal contours, and pulmonary vascularity. Emphysematous and bronchitic changes consistent with  COPD. Chronic interstitial prominence in the mid to lower lungs unchanged. No acute infiltrate, pleural effusion, or pneumothorax. RIGHT shoulder prosthesis. Diffuse  osseous demineralization with thoracic kyphosis and mild anterior compression deformities of adjacent upper thoracic vertebra unchanged. IMPRESSION: COPD changes with chronic interstitial prominence at bases. No acute abnormalities. Electronically Signed   By: Lavonia Dana M.D.   On: 06/02/2020 13:28   DG Abd 1 View  Result Date: 06/10/2020 CLINICAL DATA:  Status post NG tube placement. EXAM: ABDOMEN - 1 VIEW COMPARISON:  None. FINDINGS: NG tube tip is in the stomach with the side port at the gastroesophageal junction. Recommend advancement of 3 cm. IMPRESSION: As above. Electronically Signed   By: Inge Rise M.D.   On: 06/10/2020 15:06   DG Chest Port 1 View  Result Date: 06/10/2020 CLINICAL DATA:  Status post intubation and central line placement. EXAM: PORTABLE CHEST 1 VIEW COMPARISON:  Single-view of the chest 06/06/2020. FINDINGS: Endotracheal tube is in place with the tip 2.6 cm above the carina. Left IJ central venous catheter tip is in the upper superior vena cava. NG tube courses into the stomach and below the inferior margin of the film. Lungs are clear. No pneumothorax or pleural effusion. Heart size is normal. Aortic atherosclerosis. IMPRESSION: ETT in good position. Tip of right IJ catheter is in the upper superior vena cava. No pneumothorax. No acute cardiopulmonary disease. Electronically Signed   By: Inge Rise M.D.   On: 06/10/2020 15:05   DG Chest Portable 1 View  Result Date: 06/06/2020 CLINICAL DATA:  Shortness of breath. Low O2 sats. Post intubation and NG tube placement. EXAM: PORTABLE CHEST 1 VIEW COMPARISON:  06/02/2020. FINDINGS: Endotracheal tube noted with tip 2 cm above the carina. NG tube noted with tip below left hemidiaphragm. Heart size normal. No focal infiltrate. No pleural effusion or pneumothorax. Small bilateral pleural effusions. Total right shoulder replacement. IMPRESSION: 1. Endotracheal tube and NG tube in good anatomic position. 2. No acute  cardiopulmonary disease identified. Electronically Signed   By: Marcello Moores  Register   On: 06/06/2020 13:19   DG Abd Portable 1 View  Result Date: 06/06/2020 CLINICAL DATA:  Orogastric tube placement EXAM: PORTABLE ABDOMEN - 1 VIEW COMPARISON:  None. FINDINGS: Orogastric tube tip and side port in stomach. Visualized bowel unremarkable without dilatation or air-fluid level to suggest bowel obstruction. No free air. Visualized lung bases clear. IMPRESSION: Orogastric tube tip and side port in stomach. Visualized bowel unremarkable. No free air appreciable on supine examination. Electronically Signed   By: Lowella Grip III M.D.   On: 06/06/2020 13:16   ECHOCARDIOGRAM COMPLETE  Result Date: 06/07/2020    ECHOCARDIOGRAM REPORT   Patient Name:   Virginia [name] Date of Exam: 06/07/2020 Medical Rec #:  [id number]         Height:       62.0 in Accession #:    [id number]        Weight:       129.8 lb Date of Birth:  1951-02-19         BSA:          1.591 m Patient Age:    69 years          BP:           84/55 mmHg Patient Gender: F                 HR:           106  bpm. Exam Location:  [facility] Procedure: 2D Echo, Cardiac Doppler and Color Doppler Indications:     Acute respiratory insufficiency 518.82  History:         Patient has prior history of Echocardiogram examinations, most                  recent 11/10/2016. COPD, Arrythmias:Atrial Fibrillation; Risk                  Factors:Hypertension.  Sonographer:     Sherrie Sport RDCS (AE) Referring Phys:  [id number] Awilda Bill Diagnosing Phys: Yolonda Kida MD  Sonographer Comments: Technically challenging study due to limited acoustic windows, no apical window, no parasternal window and echo performed with patient supine and on artificial respirator. Subcostal was the only obtainable view. IMPRESSIONS  1. Left ventricular ejection fraction, by estimation, is 65 to 70%. The left ventricle has normal function. The left ventricle has no regional wall motion  abnormalities. Left ventricular diastolic function could not be evaluated.  2. Right ventricular systolic function is normal. The right ventricular size is normal.  3. The mitral valve is normal in structure. No evidence of mitral valve regurgitation.  4. The aortic valve is normal in structure. Aortic valve regurgitation is not visualized. Conclusion(s)/Recommendation(s): Poor windows for evaluation of left ventricular function by transthoracic echocardiography. Would recommend an alternative means of evaluation. FINDINGS  Left Ventricle: Left ventricular ejection fraction, by estimation, is 65 to 70%. The left ventricle has normal function. The left ventricle has no regional wall motion abnormalities. The left ventricular internal cavity size was small. There is no left ventricular hypertrophy. Left ventricular diastolic function could not be evaluated. Right Ventricle: The right ventricular size is normal. No increase in right ventricular wall thickness. Right ventricular systolic function is normal. Left Atrium: Left atrial size was normal in size. Right Atrium: Right atrial size was normal in size. Pericardium: There is no evidence of pericardial effusion. Mitral Valve: The mitral valve is normal in structure. No evidence of mitral valve regurgitation. Tricuspid Valve: The tricuspid valve is normal in structure. Tricuspid valve regurgitation is trivial. Aortic Valve: The aortic valve is normal in structure. Aortic valve regurgitation is not visualized. Pulmonic Valve: The pulmonic valve was grossly normal. Pulmonic valve regurgitation is not visualized. Aorta: The ascending aorta was not well visualized. IAS/Shunts: No atrial level shunt detected by color flow Doppler.  LEFT VENTRICLE PLAX 2D LVIDd:         2.72 cm LVIDs:         1.69 cm LV PW:         1.01 cm LV IVS:        0.93 cm  LEFT ATRIUM         Index LA diam:    2.90 cm 1.82 cm/m  PULMONIC VALVE PV Vmax:        0.75 m/s PV Peak grad:   2.3 mmHg RVOT  Peak grad: 4 mmHg  TRICUSPID VALVE TR Peak grad:   25.0 mmHg TR Vmax:        250.00 cm/s Yolonda Kida MD Electronically signed by Yolonda Kida MD Signature Date/Time: 06/07/2020/7:27:52 PM    Final    [place] EKG SITE RITE  Result Date: 06/06/2020 If Site Rite image not attached, placement could not be confirmed due to current cardiac rhythm.   ASSESSMENT: Iron deficiency anemia.  PLAN:    1.  Iron deficiency anemia: Bone marrow biopsy performed on August 30, 2019  did not report any distinct pathology.  Both cytogenetics and FISH were also reported as normal.  Patient's hemoglobin remains decreased, but significantly improved over previous.  Iron stores are now within normal limits, but despite this we'll proceed with 510 mg IV Feraheme today.  She does not require second infusion.  Return to clinic in 3 months with repeat laboratory work, further evaluation, and continuation of treatment if needed. 2.  Shortness of breath: Continue oxygen as prescribed.  Follow-up with pulmonology as scheduled.  I spent a total of 30 minutes reviewing chart data, face-to-face evaluation with the patient, counseling and coordination of care as detailed above.    Patient expressed understanding and was in agreement with this plan. She also understands that She can call clinic at any time with any questions, concerns, or complaints.     Lloyd Huger, MD   06/11/2020 8:39 AM

## 2020-06-11 NOTE — Consult Note (Signed)
PHARMACY CONSULT NOTE - FOLLOW UP  Pharmacy Consult for Electrolyte Monitoring and Replacement   Recent Labs: Potassium (mmol/L)  Date Value  06/11/2020 4.4   Magnesium (mg/dL)  Date Value  30/12/6224 2.0   Calcium (mg/dL)  Date Value  33/35/4562 9.4   Albumin (g/dL)  Date Value  56/38/9373 3.0 (L)   Phosphorus (mg/dL)  Date Value  42/87/6811 3.0   Sodium (mmol/L)  Date Value  06/11/2020 142     Assessment: 69 yo female  admitted with acute on chronic hypoxic hypercapnic respiratory failure and acute encephalopathy secondary to narcotic use,  AECOPD requiring mechanical intubation, and acute kidney injury/failure.  12/16 Patient extubated and off tube feeds 12/17 Diet ordered, AKI resolving  Pt is on amio.    Tube Feeds: Vital high protein @ 40/hr >> changed to Vital AL 1.2 Cal @ 50/hr on 12/15 >> stopped 12/16  Bowel Regimen Docusate 100 mg BID + Miralax 17 g daily starting 12/15   Goal of Therapy:  Electrolytes WNL  Plan:   No replacement needed.   Recheck electrolytes with AM labs  Ronnald Ramp ,PharmD,BCPS Clinical Pharmacist 06/11/2020 8:27 AM

## 2020-06-12 ENCOUNTER — Telehealth: Payer: Self-pay | Admitting: *Deleted

## 2020-06-12 ENCOUNTER — Encounter: Payer: Self-pay | Admitting: Anesthesiology

## 2020-06-12 ENCOUNTER — Encounter: Payer: Self-pay | Admitting: Internal Medicine

## 2020-06-12 ENCOUNTER — Inpatient Hospital Stay: Payer: Medicare Other

## 2020-06-12 LAB — BASIC METABOLIC PANEL
Anion gap: 7 (ref 5–15)
BUN: 36 mg/dL — ABNORMAL HIGH (ref 8–23)
CO2: 31 mmol/L (ref 22–32)
Calcium: 8.9 mg/dL (ref 8.9–10.3)
Chloride: 100 mmol/L (ref 98–111)
Creatinine, Ser: 0.93 mg/dL (ref 0.44–1.00)
GFR, Estimated: >60 mL/min (ref >60)
Glucose, Bld: 183 mg/dL — ABNORMAL HIGH (ref 70–99)
Potassium: 4.6 mmol/L (ref 3.5–5.1)
Sodium: 138 mmol/L (ref 135–145)

## 2020-06-12 LAB — CBC
HCT: 27 % — ABNORMAL LOW (ref 36.0–46.0)
Hemoglobin: 8.4 g/dL — ABNORMAL LOW (ref 12.0–15.0)
MCH: 30.8 pg (ref 26.0–34.0)
MCHC: 31.1 g/dL (ref 30.0–36.0)
MCV: 98.9 fL (ref 80.0–100.0)
Platelets: 348 10*3/uL (ref 150–400)
RBC: 2.73 MIL/uL — ABNORMAL LOW (ref 3.87–5.11)
RDW: 14.4 % (ref 11.5–15.5)
WBC: 13.3 10*3/uL — ABNORMAL HIGH (ref 4.0–10.5)
nRBC: 0.4 % — ABNORMAL HIGH (ref 0.0–0.2)

## 2020-06-12 LAB — GLUCOSE, CAPILLARY
Glucose-Capillary: 112 mg/dL — ABNORMAL HIGH (ref 70–99)
Glucose-Capillary: 130 mg/dL — ABNORMAL HIGH (ref 70–99)
Glucose-Capillary: 137 mg/dL — ABNORMAL HIGH (ref 70–99)
Glucose-Capillary: 149 mg/dL — ABNORMAL HIGH (ref 70–99)
Glucose-Capillary: 158 mg/dL — ABNORMAL HIGH (ref 70–99)
Glucose-Capillary: 164 mg/dL — ABNORMAL HIGH (ref 70–99)

## 2020-06-12 LAB — MAGNESIUM: Magnesium: 2.2 mg/dL (ref 1.7–2.4)

## 2020-06-12 LAB — APTT
aPTT: <24 seconds — ABNORMAL LOW (ref 24–36)
aPTT: 66 seconds — ABNORMAL HIGH (ref 24–36)

## 2020-06-12 LAB — HEPARIN LEVEL (UNFRACTIONATED): Heparin Unfractionated: >3.6 IU/mL — ABNORMAL HIGH (ref 0.30–0.70)

## 2020-06-12 LAB — PHOSPHORUS: Phosphorus: 4 mg/dL (ref 2.5–4.6)

## 2020-06-12 MED ORDER — METHYLPREDNISOLONE SODIUM SUCC 40 MG IJ SOLR
40.0000 mg | INTRAMUSCULAR | Status: DC
  Administered 2020-06-13 – 2020-06-19 (×7): 40 mg via INTRAVENOUS
  Filled 2020-06-12 (×7): qty 1

## 2020-06-12 MED ORDER — MIDODRINE HCL 5 MG PO TABS
10.0000 mg | ORAL_TABLET | Freq: Three times a day (TID) | ORAL | Status: DC
  Administered 2020-06-12 – 2020-06-16 (×11): 10 mg
  Filled 2020-06-12 (×12): qty 2

## 2020-06-12 MED ORDER — HEPARIN (PORCINE) 25000 UT/250ML-% IV SOLN
900.0000 [IU]/h | INTRAVENOUS | Status: AC
  Administered 2020-06-12: 1000 [IU]/h via INTRAVENOUS
  Administered 2020-06-13 – 2020-06-14 (×2): 850 [IU]/h via INTRAVENOUS
  Filled 2020-06-12 (×3): qty 250

## 2020-06-12 NOTE — Progress Notes (Signed)
ANTICOAGULATION CONSULT NOTE - Initial Consult  Pharmacy Consult for Heparin drip  (was on apixaban-last dose12/19 @ 2247 ) Indication: atrial fibrillation  No Known Allergies  Patient Measurements: Height: 5' 2.01" (157.5 cm) Weight: 62.6 kg (138 lb 0.1 oz) IBW/kg (Calculated) : 50.12 Heparin Dosing Weight: 62.6 kg   Vital Signs: Temp: 99.14 F (37.3 C) (12/20 0800) Temp Source: Esophageal (12/20 0800) BP: 104/69 (12/20 1715) Pulse Rate: 86 (12/20 0919)  Labs: Recent Labs    06/10/20 0422 06/10/20 1146 06/10/20 1352 06/11/20 0455 06/12/20 0442 06/12/20 0951 06/12/20 1753  HGB 8.5*  --   --  8.2* 8.4*  --   --   HCT 29.0*  --   --  26.8* 27.0*  --   --   PLT 278  --   --  298 348  --   --   APTT  --   --   --   --   --  <24* 66*  HEPARINUNFRC  --   --   --   --   --  >3.60*  --   CREATININE 0.97  --   --  1.02* 0.93  --   --   TROPONINIHS  --  142* 159*  --   --   --   --     Estimated Creatinine Clearance: 49.7 mL/min (by C-G formula based on SCr of 0.93 mg/dL).   Medical History: Past Medical History:  Diagnosis Date  . Anemia   . Anxiety   . Arthritis   . Atrial fibrillation ([facility])   . C. difficile colitis 09/2015  . COPD (chronic obstructive pulmonary disease) ([facility])   . Depression   . Dyspnea   . Dysrhythmia   . Fibromyalgia   . H/O tracheostomy   . Hep C w/ coma, chronic   . Hypertension   . MRSA pneumonia ([facility]) 2017  . On home oxygen therapy    3 L/M   . Osteoporosis   . Peripheral neuropathy   . RLS (restless legs syndrome)   . S/P percutaneous endoscopic gastrostomy (PEG) tube placement ([facility]) 09/2015  . Ventilator associated pneumonia [facility]) 10/2015   [facility], [place]    Medications:  Medications Prior to Admission  Medication Sig Dispense Refill Last Dose  . albuterol (VENTOLIN HFA) 108 (90 Base) MCG/ACT inhaler Inhale 2 puffs into the lungs every 4 (four) hours as needed for wheezing or shortness of breath.   Unknown at PRN   . amitriptyline (ELAVIL) 25 MG tablet Take 25 mg by mouth at bedtime.    18-24 hours at Unknown  . diazepam (VALIUM) 5 MG tablet Take 5 mg by mouth every 8 (eight) hours as needed for anxiety.   5 Unknown at PRN  . diltiazem (CARDIZEM CD) 120 MG 24 hr capsule Take 120 mg by mouth daily.   12-36 hours at Unknown  . estradiol (ESTRACE) 1 MG tablet Take 1 mg by mouth daily.  1 12-36 hours at Unknown  . furosemide (LASIX) 20 MG tablet Take 20 mg by mouth daily.   1 12-36 hours at Unknown  . gabapentin (NEURONTIN) 300 MG capsule Take 300 mg by mouth 3 (three) times daily.   12-36 hours at Unknown  . lisinopril (ZESTRIL) 10 MG tablet Take 10 mg by mouth daily.   12-36 hours at Unknown  . MAGNESIUM-OXIDE 400 (241.3 Mg) MG tablet Take 400 mg by mouth daily.   1   . metoprolol tartrate (LOPRESSOR) 50 MG tablet Take  50 mg by mouth 2 (two) times daily.   12-36 hours at Unknown  . Multiple Vitamin (MULTIVITAMIN) tablet Take 1 tablet by mouth daily.     Marland Kitchen omeprazole (PRILOSEC) 40 MG capsule Take 40 mg by mouth daily.   12-36 hours at Unknown  . oxyCODONE-acetaminophen (PERCOCET) 10-325 MG tablet Take 1 tablet by mouth 3 (three) times daily.   12-36 hours at Unknown  . predniSONE (DELTASONE) 5 MG tablet Take 5 mg by mouth daily.   12-36 hours at Unknown  . QUEtiapine (SEROQUEL) 25 MG tablet Take 25 mg by mouth at bedtime.   1 24+ hours at Unknown  . TRELEGY ELLIPTA 100-62.5-25 MCG/INH AEPB Inhale 1 puff into the lungs daily.   12-36 hours at Unknown  . apixaban (ELIQUIS) 5 MG TABS tablet Take 1 tablet (5 mg total) by mouth 2 (two) times daily. 60 tablet 0 12-36 hours at Unknown  . levofloxacin (LEVAQUIN) 500 MG tablet Take 1 tablet (500 mg total) by mouth daily for 10 days. 5 tablet 0 12-36 hours at Unknown  . predniSONE (DELTASONE) 10 MG tablet Take 4 tablets (40 mg total) by mouth daily. 20 tablet 0 12-36 hours at Unknown    Assessment: Pharmacy consulted to dose heparin for AFib in this 69 year old  female , was on Eliquis 5 mg PO BID prior but d/c'd due to upcoming tracheostomy procedure.   Will cover with Heparin until tracheostomy is done.  Last dose of Eliquis was on 12/19 @ 2247.  CrCl = 49.7 ml/min   Goal of Therapy:  HL:  0.3 - 0.7 APTT = 66 - 102  Monitor platelets by anticoagulation protocol: Yes   Plan:  Will draw baseline aPTT and HL 1 hr prior to start of heparin gtt.  Will start heparin gtt at 1000 units/hr on 12/20 @ ~ 1100.  No bolus. Will use aPTT to guide dosing until HL and aPTT are both therapeutic.    12/20 @ 1753 aPTT= 66. Patient is borderline therapeutic. Will slightly increase drip rate to 1050 units/hr. F/u aptt in 6 hours.    Aaleeyah Bias A 06/12/2020,6:31 PM

## 2020-06-12 NOTE — Progress Notes (Signed)
CRITICAL CARE PROGRESS NOTE    Name: Virginia [name] MRN: [id number] DOB: 03/26/1951     LOS: 28   SUBJECTIVE FINDINGS & SIGNIFICANT EVENTS    Patient description:  69 yo female with severe COPD admitted with acute on chronic hypoxic hypercapnic respiratory failure and acute encephalopathy secondary to narcotic use and AECOPD requiring mechanical intubation  12/14 admitted to ICU for COPD exacerbation 12/15 plan for SAT/SBT 12/16 failed SAT/SBT, will try again today 12/16 s/p extubation to BiPAP 12/17 resp status stilltenuous 12/18 severe resp failure high risk for intubation 12/18 Re intubated for severe COPD 12/19 severe resp  Failure, ENT consulted for trach   06/12/20- sedated to RASS-2.  Plan for trache as per previous discussion with critical care team and family. DCd amio gtt today . Weaning solumedrol to 40 daily from BID. Patient on Levophed and metoprolol we will remove arrythmogenic vasopressor today.   Lines/tubes : Airway 7.5 mm (Active)  Secured at (cm) 22 cm 06/12/20 0807  Measured From Lips 06/12/20 0807  Secured Location Left 06/12/20 0807  Secured By [employer] 06/12/20 0807  Tube Holder Repositioned Yes 06/12/20 0807  Prone position No 06/12/20 0807  Cuff Pressure (cm H2O) 26 cm H2O 06/12/20 0807  Site Condition Dry;Cool 06/12/20 0807     CVC Triple Lumen 06/10/20 Left Internal jugular (Active)  Indication for Insertion or Continuance of Line Chronic illness with exacerbations (CF, Sickle Cell, etc.) 06/11/20 2100  Site Assessment Clean;Dry;Intact 06/11/20 2100  Proximal Lumen Status Infusing;Flushed;Blood return noted 06/11/20 2100  Medial Lumen Status Infusing 06/11/20 2100  Distal Lumen Status Infusing;Flushed;Blood return noted 06/11/20 2100  Dressing Type  Transparent;Occlusive 06/11/20 2100  Dressing Status Clean;Dry;Intact 06/11/20 2100  Antimicrobial disc in place? Yes 06/11/20 2100  Line Care Connections checked and tightened 06/11/20 2100  Dressing Intervention New dressing 06/10/20 2100  Dressing Change Due 06/17/20 06/11/20 2100     NG/OG Tube Orogastric Center mouth Xray (Active)  Cm Marking at Nare/Corner of Mouth (if applicable) 61 cm 38/10/17 2000  Site Assessment Clean;Dry;Intact 06/11/20 2000  Ongoing Placement Verification No change in cm markings or external length of tube from initial placement;No change in respiratory status;No acute changes, not attributed to clinical condition 06/11/20 2000  Status Infusing tube feed 06/11/20 2000  Drainage Appearance None 06/11/20 1600  Intake (mL) 30 mL 06/11/20 1600     Urethral Catheter Skeet Latch, RN Double-lumen;Latex 14 Fr. (Active)  Indication for Insertion or Continuance of Catheter Therapy based on hourly urine output monitoring and documentation for critical condition (NOT STRICT I&O) 06/12/20 0800  Site Assessment Clean;Intact 06/12/20 0800  Catheter Maintenance Bag below level of bladder;Catheter secured;Drainage bag/tubing not touching floor;Insertion date on drainage bag;No dependent loops;Seal intact 06/12/20 0800  Collection Container Standard drainage bag 06/12/20 0800  Securement Method Securing device (Describe) 06/12/20 0800  Urinary Catheter Interventions (if applicable) Unclamped 51/02/58 0800  Output (mL) 60 mL 06/11/20 1800    Microbiology/Sepsis markers: Results for orders placed or performed during the hospital encounter of 06/06/20  Blood culture (routine x 2)     Status: None   Collection Time: 06/06/20 12:07 PM   Specimen: BLOOD  Result Value Ref Range Status   Specimen Description BLOOD RIGHT [facility]  Final   Special Requests   Final    BOTTLES DRAWN AEROBIC AND ANAEROBIC Blood Culture adequate volume   Culture   Final    NO GROWTH 5 DAYS Performed  at [facility], [address] [place], [place] [address]    Report Status 06/11/2020 FINAL  Final  Blood culture (routine x 2)     Status: None   Collection Time: 06/06/20 12:07 PM   Specimen: BLOOD  Result Value Ref Range Status   Specimen Description BLOOD LEFT AC  Final   Special Requests   Final    BOTTLES DRAWN AEROBIC AND ANAEROBIC Blood Culture adequate volume   Culture   Final    NO GROWTH 5 DAYS Performed at [facility], [address] [place], [place] [address]    Report Status 06/11/2020 FINAL  Final  Resp Panel by RT-PCR (Flu A&B, Covid) Nasopharyngeal Swab     Status: None   Collection Time: 06/06/20 12:07 PM   Specimen: Nasopharyngeal Swab; Nasopharyngeal(NP) swabs in vial transport medium  Result Value Ref Range Status   SARS Coronavirus 2 by RT PCR NEGATIVE NEGATIVE Final    Comment: (NOTE) SARS-CoV-2 target nucleic acids are NOT DETECTED.  The SARS-CoV-2 RNA is generally detectable in upper respiratory specimens during the acute phase of infection. The lowest concentration of SARS-CoV-2 viral copies this assay can detect is 138 copies/mL. A negative result does not preclude SARS-Cov-2 infection and should not be used as the sole basis for treatment or other patient management decisions. A negative result may occur with  improper specimen collection/handling, submission of specimen other than nasopharyngeal swab, presence of viral mutation(s) within the areas targeted by this assay, and inadequate number of viral copies(<138 copies/mL). A negative result must be combined with clinical observations, patient history, and epidemiological information. The expected result is Negative.  Fact Sheet for Patients:  EntrepreneurPulse.com.au  Fact Sheet for Healthcare Providers:  IncredibleEmployment.be  This test is no t yet approved or cleared by the [place] FDA and  has been authorized for detection  and/or diagnosis of SARS-CoV-2 by FDA under an Emergency Use Authorization (EUA). This EUA will remain  in effect (meaning this test can be used) for the duration of the COVID-19 declaration under Section 564(b)(1) of the Act, 21 U.S.C.section 360bbb-3(b)(1), unless the authorization is terminated  or revoked sooner.       Influenza A by PCR NEGATIVE NEGATIVE Final   Influenza B by PCR NEGATIVE NEGATIVE Final    Comment: (NOTE) The Xpert Xpress SARS-CoV-2/FLU/RSV plus assay is intended as an aid in the diagnosis of influenza from Nasopharyngeal swab specimens and should not be used as a sole basis for treatment. Nasal washings and aspirates are unacceptable for Xpert Xpress SARS-CoV-2/FLU/RSV testing.  Fact Sheet for Patients: EntrepreneurPulse.com.au  Fact Sheet for Healthcare Providers: IncredibleEmployment.be  This test is not yet approved or cleared by the [place] FDA and has been authorized for detection and/or diagnosis of SARS-CoV-2 by FDA under an Emergency Use Authorization (EUA). This EUA will remain in effect (meaning this test can be used) for the duration of the COVID-19 declaration under Section 564(b)(1) of the Act, 21 U.S.C. section 360bbb-3(b)(1), unless the authorization is terminated or revoked.  Performed at [facility], [address] [place], [place] [address]   MRSA PCR Screening     Status: None   Collection Time: 06/06/20 11:00 PM   Specimen: Nasopharyngeal  Result Value Ref Range Status   MRSA by PCR NEGATIVE NEGATIVE Final    Comment:        The GeneXpert MRSA Assay (FDA approved for NASAL specimens only), is one component of a comprehensive MRSA colonization surveillance program. It is not intended to diagnose  MRSA infection nor to guide or monitor treatment for MRSA infections. Performed at [facility], [address], [place], [place] [address]     Anti-infectives:   Anti-infectives (From admission, onward)   Start     Dose/Rate Route Frequency Ordered Stop   06/07/20 1400  azithromycin (ZITHROMAX) 500 mg in sodium chloride 0.9 % 250 mL IVPB  Status:  Discontinued        500 mg 250 mL/hr over 60 Minutes Intravenous Every 24 hours 06/06/20 2030 06/06/20 2044   06/07/20 1400  azithromycin (ZITHROMAX) 500 mg in sodium chloride 0.9 % 250 mL IVPB  Status:  Discontinued        500 mg 250 mL/hr over 60 Minutes Intravenous Every 24 hours 06/06/20 2044 06/08/20 1031   06/06/20 1415  cefTRIAXone (ROCEPHIN) 2 g in sodium chloride 0.9 % 100 mL IVPB        2 g 200 mL/hr over 30 Minutes Intravenous  Once 06/06/20 1407 06/06/20 1514   06/06/20 1415  azithromycin (ZITHROMAX) 500 mg in sodium chloride 0.9 % 250 mL IVPB        500 mg 250 mL/hr over 60 Minutes Intravenous  Once 06/06/20 1407 06/06/20 1708       Consults:   ENT - tracheostomy  PAST MEDICAL HISTORY   Past Medical History:  Diagnosis Date  . Anemia   . Anxiety   . Arthritis   . Atrial fibrillation ([place])   . C. difficile colitis 09/2015  . COPD (chronic obstructive pulmonary disease) ([place])   . Depression   . Dyspnea   . Dysrhythmia   . Fibromyalgia   . H/O tracheostomy   . Hep C w/ coma, chronic   . Hypertension   . MRSA pneumonia ([place]) 2017  . On home oxygen therapy    3 L/M   . Osteoporosis   . Peripheral neuropathy   . RLS (restless legs syndrome)   . S/P percutaneous endoscopic gastrostomy (PEG) tube placement ([place]) 09/2015  . Ventilator associated pneumonia [facility]) 10/2015   [facility], [place]     SURGICAL HISTORY   Past Surgical History:  Procedure Laterality Date  . ABDOMINAL HYSTERECTOMY    . BREAST SURGERY Bilateral    Breast Implants  . DILATION AND CURETTAGE OF UTERUS    . REVERSE SHOULDER ARTHROPLASTY Right 03/06/2017   Procedure: REVERSE SHOULDER ARTHROPLASTY;  Surgeon: Corky Mull, MD;  Location: [facility] ORS;  Service: Orthopedics;  Laterality: Right;  .  ROTATOR CUFF REPAIR Bilateral      FAMILY HISTORY   Family History  Problem Relation Age of Onset  . Hypertension Mother      SOCIAL HISTORY   Social History   Tobacco Use  . Smoking status: Former Smoker    Packs/day: 1.50    Types: Cigarettes    Quit date: 10/05/2015    Years since quitting: 4.6  . Smokeless tobacco: Never Used  Vaping Use  . Vaping Use: Some days  . Last attempt to quit: 10/05/2015  Substance Use Topics  . Alcohol use: No  . Drug use: No     MEDICATIONS   Current Medication:  Current Facility-Administered Medications:  .  0.9 %  sodium chloride infusion, 250 mL, Intravenous, Continuous, Duffy Bruce, MD, Last Rate: 10 mL/hr at 06/10/20 1700, Infusion Verify at 06/10/20 1700 .  0.9 %  sodium chloride infusion, 250 mL, Intravenous, PRN, Kasa, Kurian, MD .  0.9 %  sodium chloride infusion, 250 mL, Intravenous, Continuous,  Flora Lipps, MD, Last Rate: 10 mL/hr at 06/11/20 1800, Infusion Verify at 06/11/20 1800 .  acetaminophen (TYLENOL) 160 MG/5ML solution 650 mg, 650 mg, Per Tube, Q4H PRN, Flora Lipps, MD .  Margrett Rud amiodarone (NEXTERONE) 1.8 mg/mL load via infusion 150 mg, 150 mg, Intravenous, Once, 150 mg at 06/09/20 1715 **FOLLOWED BY** [EXPIRED] amiodarone (NEXTERONE PREMIX) 360-4.14 MG/200ML-% (1.8 mg/mL) IV infusion, 60 mg/hr, Intravenous, Continuous, Stopped at 06/09/20 2234 **FOLLOWED BY** amiodarone (NEXTERONE PREMIX) 360-4.14 MG/200ML-% (1.8 mg/mL) IV infusion, 30 mg/hr, Intravenous, Continuous, Flora Lipps, MD, Stopped at 06/10/20 1127 .  amitriptyline (ELAVIL) tablet 25 mg, 25 mg, Per Tube, QHS, Kasa, Kurian, MD .  budesonide (PULMICORT) nebulizer solution 0.5 mg, 0.5 mg, Nebulization, BID, Blakeney, Dreama Saa, NP, 0.5 mg at 06/12/20 0805 .  calcium-vitamin D (OSCAL WITH D) 500-200 MG-UNIT per tablet 1 tablet, 1 tablet, Per Tube, Q breakfast, Flora Lipps, MD, 1 tablet at 06/12/20 0919 .  chlorhexidine gluconate (MEDLINE KIT) (PERIDEX)  0.12 % solution 15 mL, 15 mL, Mouth Rinse, BID, Kasa, Kurian, MD, 15 mL at 06/12/20 0937 .  Chlorhexidine Gluconate Cloth 2 % PADS 6 each, 6 each, Topical, Daily, Flora Lipps, MD, 6 each at 06/12/20 1002 .  diazepam (VALIUM) tablet 5 mg, 5 mg, Per Tube, Q8H PRN, Rust-Chester, Britton L, NP, 5 mg at 06/11/20 0523 .  diltiazem (CARDIZEM) tablet 30 mg, 30 mg, Per Tube, Q6H, Renda Rolls, RPH, 30 mg at 06/12/20 0521 .  docusate (COLACE) 50 MG/5ML liquid 100 mg, 100 mg, Per Tube, BID, Renda Rolls, RPH, 100 mg at 06/12/20 0919 .  feeding supplement (VITAL AF 1.2 CAL) liquid 1,000 mL, 1,000 mL, Per Tube, Continuous, Rust-Chester, Britton L, NP, Last Rate: 50 mL/hr at 06/12/20 0523, 1,000 mL at 06/12/20 0523 .  fentaNYL 2569mg in NS 2530m(1063mml) infusion-PREMIX, 0-400 mcg/hr, Intravenous, Continuous, Kasa, Kurian, MD, Last Rate: 35 mL/hr at 06/12/20 0356, 350 mcg/hr at 06/12/20 0356 .  gabapentin (NEURONTIN) capsule 300 mg, 300 mg, Per Tube, TID, KasFlora LippsD, 300 mg at 06/12/20 0919 .  heparin ADULT infusion 100 units/mL (25000 units/250m11mdium chloride 0.45%), 1,000 Units/hr, Intravenous, Continuous, Kasa, Kurian, MD .  insulin aspart (novoLOG) injection 0-9 Units, 0-9 Units, Subcutaneous, Q4H, Rust-Chester, Britton L, NP, 2 Units at 06/12/20 0818 .  ipratropium-albuterol (DUONEB) 0.5-2.5 (3) MG/3ML nebulizer solution 3 mL, 3 mL, Nebulization, Q6H, Blakeney, Dana G, NP, 3 mL at 06/12/20 0805 .  ipratropium-albuterol (DUONEB) 0.5-2.5 (3) MG/3ML nebulizer solution 3 mL, 3 mL, Nebulization, Q6H PRN, BlakAwilda Bill .  MEDLINE mouth rinse, 15 mL, Mouth Rinse, 10 times per day, KasaFlora Lipps, 15 mL at 06/12/20 1002 .  methylPREDNISolone sodium succinate (SOLU-MEDROL) 40 mg/mL injection 40 mg, 40 mg, Intravenous, Q12H, Blakeney, DanaDreama Saa, 40 mg at 06/12/20 0929 .  metoprolol tartrate (LOPRESSOR) tablet 50 mg, 50 mg, Per Tube, BID, KasaFlora Lipps, 50 mg at 06/12/20 0919 .   norepinephrine (LEVOPHED) 16 mg in 250mL53mmix infusion, 0-40 mcg/min, Intravenous, Titrated, Rust-Chester, Britton L, NP, Last Rate: 2.81 mL/hr at 06/11/20 1800, 3 mcg/min at 06/11/20 1800 .  ondansetron (ZOFRAN) injection 4 mg, 4 mg, Intravenous, Q6H PRN, Kasa,Mortimer Friesian, MD .  pantoprazole (PROTONIX) injection 40 mg, 40 mg, Intravenous, QHS, Kasa, Kurian, MD, 40 mg at 06/11/20 2247 .  polyethylene glycol (MIRALAX / GLYCOLAX) packet 17 g, 17 g, Per Tube, Daily PRN, Kasa,Mortimer Friesian, MD .  polyethylene glycol (MIRALAX / GLYCOLAX) packet 17 g, 17 g, Per  Tube, Daily, Flora Lipps, MD, 17 g at 06/12/20 0919 .  propofol (DIPRIVAN) 1000 MG/100ML infusion, 5-80 mcg/kg/min, Intravenous, Titrated, Kasa, Kurian, MD, Last Rate: 22.5 mL/hr at 06/12/20 0712, 60 mcg/kg/min at 06/12/20 0712 .  QUEtiapine (SEROQUEL) tablet 25 mg, 25 mg, Per Tube, QHS, Kasa, Kurian, MD .  sodium chloride flush (NS) 0.9 % injection 10-40 mL, 10-40 mL, Intracatheter, Q12H, Kasa, Kurian, MD, 10 mL at 06/12/20 0929 .  sodium chloride flush (NS) 0.9 % injection 10-40 mL, 10-40 mL, Intracatheter, PRN, Mortimer Fries, Kurian, MD .  sodium chloride flush (NS) 0.9 % injection 3 mL, 3 mL, Intravenous, Q12H, Kasa, Kurian, MD, 3 mL at 06/12/20 0930 .  sodium chloride flush (NS) 0.9 % injection 3 mL, 3 mL, Intravenous, PRN, Flora Lipps, MD    ALLERGIES   Patient has no known allergies.    REVIEW OF SYSTEMS    Unable to obtain on sedation and mechanical ventilation  PHYSICAL EXAMINATION   Vital Signs: Temp:  [96.8 F (36 C)-99.1 F (37.3 C)] 98.8 F (37.1 C) (12/20 0600) Pulse Rate:  [50-86] 86 (12/20 0919) Resp:  [12-28] 14 (12/20 0600) BP: (90-157)/(60-88) 104/69 (12/20 0919) SpO2:  [90 %-100 %] 97 % (12/20 0807) FiO2 (%):  [21 %-30 %] 30 % (12/20 0807)  GENERAL:age appropriate sedated on MV HEAD: Normocephalic, atraumatic.  EYES: Pupils equal, round, reactive to light.  No scleral icterus.  MOUTH: Moist mucosal membrane. NECK:  Supple. No thyromegaly. No nodules. No JVD.  PULMONARY: mild rhonchi CARDIOVASCULAR: S1 and S2. Regular rate and rhythm. No murmurs, rubs, or gallops.  GASTROINTESTINAL: Soft, nontender, non-distended. No masses. Positive bowel sounds. No hepatosplenomegaly.  MUSCULOSKELETAL: No swelling, clubbing, or edema.  NEUROLOGIC: GCS3T SKIN:intact,warm,dry   PERTINENT DATA     Infusions: . sodium chloride 10 mL/hr at 06/10/20 1700  . sodium chloride    . sodium chloride 10 mL/hr at 06/11/20 1800  . amiodarone Stopped (06/10/20 1127)  . feeding supplement (VITAL AF 1.2 CAL) 1,000 mL (06/12/20 0523)  . fentaNYL infusion INTRAVENOUS 350 mcg/hr (06/12/20 0356)  . heparin    . norepinephrine (LEVOPHED) Adult infusion 3 mcg/min (06/11/20 1800)  . propofol (DIPRIVAN) infusion 60 mcg/kg/min (06/12/20 5361)   Scheduled Medications: . amitriptyline  25 mg Per Tube QHS  . budesonide (PULMICORT) nebulizer solution  0.5 mg Nebulization BID  . calcium-vitamin D  1 tablet Per Tube Q breakfast  . chlorhexidine gluconate (MEDLINE KIT)  15 mL Mouth Rinse BID  . Chlorhexidine Gluconate Cloth  6 each Topical Daily  . diltiazem  30 mg Per Tube Q6H  . docusate  100 mg Per Tube BID  . gabapentin  300 mg Per Tube TID  . insulin aspart  0-9 Units Subcutaneous Q4H  . ipratropium-albuterol  3 mL Nebulization Q6H  . mouth rinse  15 mL Mouth Rinse 10 times per day  . methylPREDNISolone (SOLU-MEDROL) injection  40 mg Intravenous Q12H  . metoprolol tartrate  50 mg Per Tube BID  . pantoprazole (PROTONIX) IV  40 mg Intravenous QHS  . polyethylene glycol  17 g Per Tube Daily  . QUEtiapine  25 mg Per Tube QHS  . sodium chloride flush  10-40 mL Intracatheter Q12H  . sodium chloride flush  3 mL Intravenous Q12H   PRN Medications: sodium chloride, acetaminophen (TYLENOL) oral liquid 160 mg/5 mL, diazepam, ipratropium-albuterol, ondansetron (ZOFRAN) IV, polyethylene glycol, sodium chloride flush, sodium chloride  flush Hemodynamic parameters:   Intake/Output: 12/19 0701 - 12/20 0700 In: 1056.9 [I.V.:826.9;  NG/GT:230] Out: 375 [Urine:375]  Ventilator  Settings: Vent Mode: PRVC FiO2 (%):  [21 %-30 %] 30 % Set Rate:  [14 bmp] 14 bmp Vt Set:  [420 mL-530 mL] 420 mL PEEP:  [5 cmH20] 5 cmH20 Plateau Pressure:  [22 cmH20-26 cmH20] 22 cmH20   LAB RESULTS:  Basic Metabolic Panel: Recent Labs  Lab 06/08/20 0405 06/09/20 0643 06/10/20 0422 06/11/20 0455 06/12/20 0442  NA 134* 140 143 142 138  K 3.5 4.4 4.9 4.4 4.6  CL 99 104 105 103 100  CO2 28 29 32 29 31  GLUCOSE 161* 137* 123* 167* 183*  BUN 38* 38* 34* 35* 36*  CREATININE 1.41* 0.97 0.97 1.02* 0.93  CALCIUM 7.8* 8.3* 9.0 9.4 8.9  MG 2.6* 2.2 2.2 2.0 2.2  PHOS 2.2* 3.8 4.7* 3.0 4.0   Liver Function Tests: Recent Labs  Lab 06/06/20 1207 06/10/20 0422  AST 48* 23  ALT 46* 25  ALKPHOS 17* 13*  BILITOT 0.6 0.5  PROT 6.9 5.5*  ALBUMIN 3.9 3.0*   No results for input(s): LIPASE, AMYLASE in the last 168 hours. No results for input(s): AMMONIA in the last 168 hours. CBC: Recent Labs  Lab 06/06/20 1207 06/07/20 0624 06/08/20 0405 06/09/20 0643 06/10/20 0422 06/11/20 0455 06/12/20 0442  WBC 13.9*   < > 6.3 8.1 12.8* 11.5* 13.3*  NEUTROABS 12.8*  --   --   --   --   --   --   HGB 10.7*   < > 7.8* 7.9* 8.5* 8.2* 8.4*  HCT 35.0*   < > 24.5* 24.7* 29.0* 26.8* 27.0*  MCV 99.4   < > 95.3 98.4 103.2* 100.0 98.9  PLT 304   < > 220 222 278 298 348   < > = values in this interval not displayed.   Cardiac Enzymes: No results for input(s): CKTOTAL, CKMB, CKMBINDEX, TROPONINI in the last 168 hours. BNP: Invalid input(s): POCBNP CBG: Recent Labs  Lab 06/11/20 1558 06/11/20 1944 06/11/20 2319 06/12/20 0403 06/12/20 0740  GLUCAP 172* 165* 146* 164* 158*       IMAGING RESULTS:  Imaging: DG Abd 1 View  Result Date: 06/10/2020 CLINICAL DATA:  Status post NG tube placement. EXAM: ABDOMEN - 1 VIEW COMPARISON:  None.  FINDINGS: NG tube tip is in the stomach with the side port at the gastroesophageal junction. Recommend advancement of 3 cm. IMPRESSION: As above. Electronically Signed   By: Inge Rise M.D.   On: 06/10/2020 15:06   DG Chest Port 1 View  Result Date: 06/11/2020 CLINICAL DATA:  Acute respiratory failure. Endotracheally intubated. EXAM: PORTABLE CHEST 1 VIEW COMPARISON:  06/10/2020 FINDINGS: Support lines and tubes in appropriate position. Heart size is within normal limits. Tortuosity of thoracic aorta again noted. Pulmonary hyperinflation is again seen. No evidence of acute infiltrate or pleural effusion. IMPRESSION: Pulmonary hyperinflation.  No active lung disease. Electronically Signed   By: Marlaine Hind M.D.   On: 06/11/2020 09:09   DG Chest Port 1 View  Result Date: 06/10/2020 CLINICAL DATA:  Status post intubation and central line placement. EXAM: PORTABLE CHEST 1 VIEW COMPARISON:  Single-view of the chest 06/06/2020. FINDINGS: Endotracheal tube is in place with the tip 2.6 cm above the carina. Left IJ central venous catheter tip is in the upper superior vena cava. NG tube courses into the stomach and below the inferior margin of the film. Lungs are clear. No pneumothorax or pleural effusion. Heart size is normal. Aortic atherosclerosis. IMPRESSION: ETT in  good position. Tip of right IJ catheter is in the upper superior vena cava. No pneumothorax. No acute cardiopulmonary disease. Electronically Signed   By: Inge Rise M.D.   On: 06/10/2020 15:05   _0 @ No results found.   ASSESSMENT AND PLAN    -Multidisciplinary rounds held today  Acute Hypoxic Respiratory Failure -FiO2 is at 30% today -AECOPD  -continue Full MV support -continue Bronchodilator Therapy -Wean Fio2 and PEEP as tolerated -will perform SAT/SBT when respiratory parameters are met -s/p KUB - OGT advanced as indicated by radiologist   Acute on chronic diastolic CHF  - patient is on levophed and  metoprolol - will get rid of levophed and use OGT midodrine for vasopressor support   GI/Nutrition GI PROPHYLAXIS as indicated DIET-->TF's as tolerated Constipation protocol as indicated  ENDO - ICU hypoglycemic\Hyperglycemia protocol -check FSBS per protocol   ELECTROLYTES -follow labs as needed -replace as needed -pharmacy consultation   DVT/GI PRX ordered -SCDs  TRANSFUSIONS AS NEEDED MONITOR FSBS ASSESS the need for LABS as needed   Critical care provider statement:    Critical care time (minutes):  33   Critical care time was exclusive of:  Separately billable procedures and treating other patients   Critical care was necessary to treat or prevent imminent or life-threatening deterioration of the following conditions:  acute hypoxemic respiratory failure, AECOPD, multiple comorbid conditions   Critical care was time spent personally by me on the following activities:  Development of treatment plan with patient or surrogate, discussions with consultants, evaluation of patient's response to treatment, examination of patient, obtaining history from patient or surrogate, ordering and performing treatments and interventions, ordering and review of laboratory studies and re-evaluation of patient's condition.  I assumed direction of critical care for this patient from another provider in my specialty: no    This document was prepared using Dragon voice recognition software and may include unintentional dictation errors.    Ottie Glazier, M.D.  Division of [place]

## 2020-06-12 NOTE — Anesthesia Preprocedure Evaluation (Deleted)
Anesthesia Evaluation  Patient identified by MRN, date of birth, ID band Patient unresponsive    Reviewed: Allergy & Precautions, H&P , NPO status , Patient's Chart, lab work & pertinent test results  History of Anesthesia Complications Negative for: history of anesthetic complications  Airway Mallampati: Intubated  TM Distance: >3 FB     Dental  (+) Poor Dentition   Pulmonary shortness of breath, pneumonia, COPD,  oxygen dependent, former smoker,    Pulmonary exam normal        Cardiovascular hypertension, + CAD  Normal cardiovascular exam+ dysrhythmias      Neuro/Psych PSYCHIATRIC DISORDERS  Neuromuscular disease negative neurological ROS  negative psych ROS   GI/Hepatic GERD  ,(+) Hepatitis -  Endo/Other  negative endocrine ROS  Renal/GU      Musculoskeletal  (+) Arthritis , Fibromyalgia -  Abdominal   Peds  Hematology negative hematology ROS (+)   Anesthesia Other Findings Past Medical History: No date: Anemia No date: Anxiety No date: Arthritis No date: Atrial fibrillation (HCC) 09/2015: C. difficile colitis No date: COPD (chronic obstructive pulmonary disease) (HCC) No date: Depression No date: Dyspnea No date: Dysrhythmia No date: Fibromyalgia No date: H/O tracheostomy No date: Hep C w/ coma, chronic No date: Hypertension 2017: MRSA pneumonia (HCC) No date: On home oxygen therapy     Comment:  3 L/M  No date: Osteoporosis No date: Peripheral neuropathy No date: RLS (restless legs syndrome) 09/2015: S/P percutaneous endoscopic gastrostomy (PEG) tube placement  (HCC) 10/2015: Ventilator associated pneumonia (HCC)     Comment:  [facility], [place]  Past Surgical History: No date: ABDOMINAL HYSTERECTOMY No date: BREAST SURGERY; Bilateral     Comment:  Breast Implants No date: DILATION AND CURETTAGE OF UTERUS 03/06/2017: REVERSE SHOULDER ARTHROPLASTY; Right     Comment:  Procedure:  REVERSE SHOULDER ARTHROPLASTY;  Surgeon:               Christena Flake, MD;  Location: [facility] ORS;  Service:               Orthopedics;  Laterality: Right; No date: ROTATOR CUFF REPAIR; Bilateral  BMI    Body Mass Index: 25.24 kg/m      Reproductive/Obstetrics negative OB ROS                             Anesthesia Physical Anesthesia Plan  ASA: IV  Anesthesia Plan: General ETT   Post-op Pain Management:    Induction: Intravenous  PONV Risk Score and Plan: Ondansetron, Dexamethasone, Midazolam and Treatment may vary due to age or medical condition  Airway Management Planned: Oral ETT  Additional Equipment:   Intra-op Plan:   Post-operative Plan: Post-operative intubation/ventilation  Informed Consent: I have reviewed the patients History and Physical, chart, labs and discussed the procedure including the risks, benefits and alternatives for the proposed anesthesia with the patient or authorized representative who has indicated his/her understanding and acceptance.     Dental Advisory Given  Plan Discussed with: Anesthesiologist, CRNA and Surgeon  Anesthesia Plan Comments: (History and phone consent from the patients son Shakendra Griffeth at [phone]   Son consented for risks of anesthesia including but not limited to:  - adverse reactions to medications - damage to eyes, teeth, lips or other oral mucosa - nerve damage due to positioning  - sore throat or hoarseness - Damage to heart, brain, nerves, lungs, other parts of body or loss of life  He voiced understanding.)        Anesthesia Quick Evaluation

## 2020-06-12 NOTE — Consult Note (Signed)
PHARMACY CONSULT NOTE - FOLLOW UP  Pharmacy Consult for Electrolyte Monitoring and Replacement   Recent Labs: Potassium (mmol/L)  Date Value  06/12/2020 4.6   Magnesium (mg/dL)  Date Value  60/63/0160 2.2   Calcium (mg/dL)  Date Value  10/93/2355 8.9   Albumin (g/dL)  Date Value  73/22/0254 3.0 (L)   Phosphorus (mg/dL)  Date Value  27/11/2374 4.0   Sodium (mmol/L)  Date Value  06/12/2020 138     Assessment: 69 yo female  admitted with acute on chronic hypoxic hypercapnic respiratory failure and acute encephalopathy secondary to narcotic use,  AECOPD requiring mechanical intubation, and acute kidney injury/failure.    Goal of Therapy:  Electrolytes WNL  Plan:   No replacement needed.   Recheck electrolytes with AM labs  Pricilla Riffle ,PharmD Clinical Pharmacist 06/12/2020 1:26 PM

## 2020-06-12 NOTE — Progress Notes (Signed)
On 12/18 I advanced OG tube the 3 cm that was requested by radiology. I did document this on my assessment that night.

## 2020-06-12 NOTE — Progress Notes (Signed)
Nutrition Follow-up  DOCUMENTATION CODES:   Not applicable  INTERVENTION:  Continue Vital AF 1.2 Cal at 50 mL/hr 91200 mL goal daily volume). Provides 1440 kcal, 90 grams of protein, 972 mL H2O daily. With current propofol rate provides 2034 kcal daily.  NUTRITION DIAGNOSIS:   Increased nutrient needs related to catabolic illness (COPD) as evidenced by estimated needs.  Ongoing.  GOAL:   Patient will meet greater than or equal to 90% of their needs  Met with TF regimen.  MONITOR:   PO intake,Supplement acceptance,Labs,Weight trends,I & O's  REASON FOR ASSESSMENT:   Ventilator,Consult Assessment of nutrition requirement/status,Enteral/tube feeding initiation and management  ASSESSMENT:   69 year old female with PMHx of COPD, hx tracheostomy tube placement and PEG tube placement in 2017, HTN, depression, RLS, osteoporosis, hepatitis C, A-fib, anxiety, arthritis admitted with acute on chronic hypoxic hypercapnic respiratory failure and acute encephalopathy secondary to narcotic use and acute exacerbation of COPD.  12/14 intubated 12/16 extubated 12/18 reintubated and TFs restarted  Patient is currently intubated on ventilator support MV: 6 L/min Temp (24hrs), Avg:98.1 F (36.7 C), Min:96.8 F (36 C), Max:99.14 F (37.3 C)  Propofol: 22.5 ml/hr (594 kcal daily)  Medications reviewed and include: Oscal with D 1 tablet daily, Colace 100 mg BID, Novolog 0-9 units Q4hrs, Solu-Medrol 40 mg Q24hrs IV, Protonix, Miralax, fentanyl gtt, heparin infusion, norepinephrine gtt at 5 mcg/min, propofol gtt.  Labs reviewed: CBG 130-158, BUN 36.  I/O: 375 mL UOP Yesterday (0.2 mL/kg/hr)  Weight trend: 62.6 kg on 12/17; +3.7 kg from 12/14  Enteral Access: OGT placed 12/18; tube now advanced and terminates in gastric body per abdominal x-ray 12/20  TF regimen: Vital AF 1.2 Cal at 50 mL/hr  Discussed with RN and on rounds. Repeat abdominal x-ray was taken today to assess where tube  terminates.  Diet Order:   Diet Order            Diet NPO time specified  Diet effective now                EDUCATION NEEDS:   No education needs have been identified at this time  Skin:  Skin Assessment: Reviewed RN Assessment  Last BM:  06/10/2020 per chart  Height:   Ht Readings from Last 1 Encounters:  06/06/20 5' 2.01" (1.575 m)   Weight:   Wt Readings from Last 1 Encounters:  06/09/20 62.6 kg   Ideal Body Weight:  50 kg  BMI:  Body mass index is 25.24 kg/m.  Estimated Nutritional Needs:   Kcal:  1472  Protein:  85-95 grams  Fluid:  1.7-1.9 L/day   King, MS, RD, LDN Pager number available on Amion 

## 2020-06-12 NOTE — Progress Notes (Signed)
ANTICOAGULATION CONSULT NOTE - Initial Consult  Pharmacy Consult for Eliquis  Indication: atrial fibrillation  No Known Allergies  Patient Measurements: Height: 5' 2.01" (157.5 cm) Weight: 62.6 kg (138 lb 0.1 oz) IBW/kg (Calculated) : 50.12 Heparin Dosing Weight: 62.6 kg   Vital Signs: Temp: 97.9 F (36.6 C) (12/19 2100) BP: 120/73 (12/20 0521) Pulse Rate: 64 (12/19 2100)  Labs: Recent Labs    06/10/20 0422 06/10/20 1146 06/10/20 1352 06/11/20 0455 06/12/20 0442  HGB 8.5*  --   --  8.2* 8.4*  HCT 29.0*  --   --  26.8* 27.0*  PLT 278  --   --  298 348  CREATININE 0.97  --   --  1.02* 0.93  TROPONINIHS  --  142* 159*  --   --     Estimated Creatinine Clearance: 49.7 mL/min (by C-G formula based on SCr of 0.93 mg/dL).   Medical History: Past Medical History:  Diagnosis Date  . Anemia   . Anxiety   . Arthritis   . Atrial fibrillation (HCC)   . C. difficile colitis 09/2015  . COPD (chronic obstructive pulmonary disease) (HCC)   . Depression   . Dyspnea   . Dysrhythmia   . Fibromyalgia   . H/O tracheostomy   . Hep C w/ coma, chronic   . Hypertension   . MRSA pneumonia (HCC) 2017  . On home oxygen therapy    3 L/M   . Osteoporosis   . Peripheral neuropathy   . RLS (restless legs syndrome)   . S/P percutaneous endoscopic gastrostomy (PEG) tube placement (HCC) 09/2015  . Ventilator associated pneumonia [facility]) 10/2015   [facility], [place]    Medications:  Medications Prior to Admission  Medication Sig Dispense Refill Last Dose  . albuterol (VENTOLIN HFA) 108 (90 Base) MCG/ACT inhaler Inhale 2 puffs into the lungs every 4 (four) hours as needed for wheezing or shortness of breath.   Unknown at PRN  . amitriptyline (ELAVIL) 25 MG tablet Take 25 mg by mouth at bedtime.    18-24 hours at Unknown  . diazepam (VALIUM) 5 MG tablet Take 5 mg by mouth every 8 (eight) hours as needed for anxiety.   5 Unknown at PRN  . diltiazem (CARDIZEM CD) 120 MG 24 hr  capsule Take 120 mg by mouth daily.   12-36 hours at Unknown  . estradiol (ESTRACE) 1 MG tablet Take 1 mg by mouth daily.  1 12-36 hours at Unknown  . furosemide (LASIX) 20 MG tablet Take 20 mg by mouth daily.   1 12-36 hours at Unknown  . gabapentin (NEURONTIN) 300 MG capsule Take 300 mg by mouth 3 (three) times daily.   12-36 hours at Unknown  . lisinopril (ZESTRIL) 10 MG tablet Take 10 mg by mouth daily.   12-36 hours at Unknown  . MAGNESIUM-OXIDE 400 (241.3 Mg) MG tablet Take 400 mg by mouth daily.   1   . metoprolol tartrate (LOPRESSOR) 50 MG tablet Take 50 mg by mouth 2 (two) times daily.   12-36 hours at Unknown  . Multiple Vitamin (MULTIVITAMIN) tablet Take 1 tablet by mouth daily.     Marland Kitchen omeprazole (PRILOSEC) 40 MG capsule Take 40 mg by mouth daily.   12-36 hours at Unknown  . oxyCODONE-acetaminophen (PERCOCET) 10-325 MG tablet Take 1 tablet by mouth 3 (three) times daily.   12-36 hours at Unknown  . predniSONE (DELTASONE) 5 MG tablet Take 5 mg by mouth daily.   12-36 hours  at Unknown  . QUEtiapine (SEROQUEL) 25 MG tablet Take 25 mg by mouth at bedtime.   1 24+ hours at Unknown  . TRELEGY ELLIPTA 100-62.5-25 MCG/INH AEPB Inhale 1 puff into the lungs daily.   12-36 hours at Unknown  . apixaban (ELIQUIS) 5 MG TABS tablet Take 1 tablet (5 mg total) by mouth 2 (two) times daily. 60 tablet 0 12-36 hours at Unknown  . levofloxacin (LEVAQUIN) 500 MG tablet Take 1 tablet (500 mg total) by mouth daily for 10 days. 5 tablet 0 12-36 hours at Unknown  . predniSONE (DELTASONE) 10 MG tablet Take 4 tablets (40 mg total) by mouth daily. 20 tablet 0 12-36 hours at Unknown    Assessment: Pharmacy consulted to dose heparin for AFib in this 69 year old female , was on Eliquis 5 mg PO BID prior but d/c'd due to upcoming tracheostomy procedure.   Will cover with Heparin until tracheostomy is done.  Last dose of Eliquis was on 12/19 @ 2247.  CrCl = 49.7 ml/min   Goal of Therapy:  HL:  0.3 - 0.7 APTT = 66 -  102  Monitor platelets by anticoagulation protocol: Yes   Plan:  Will draw baseline aPTT and HL 1 hr prior to start of heparin gtt.  Will start heparin gtt at 1000 units/hr on 12/20 @ ~ 1100.  No bolus. Will use aPTT to guide dosing until HL and aPTT are both therapeutic.   Kelby Lotspeich D 06/12/2020,5:42 AM

## 2020-06-12 NOTE — TOC Initial Note (Signed)
Transition of Care [facility]) - Initial/Assessment Note    Patient Details  Name: Virginia [name] MRN: [id number] Date of Birth: [birth year]  Transition of [facility]) CM/SW Contact:    Joseph Art, LCSWA Phone Number: 06/12/2020, 3:16 PM  Clinical Narrative:                  Patient presents to [facility] ED due to SOB.  Patient intubated and sedated.  Patient was extubated 12/16 and re-intubated on 12/19 due severe resp failure, ENT consulted for trach.  Main contact is Virginia [name], Virginia [name] (Son) [phone].  CSW unable to contact him for collateral information.  TOC will continue to follow.  Barriers to Discharge: Continued Medical Work up   Patient Goals and CMS Choice        Expected Discharge Plan and Services   In-house Referral: Clinical Social Work,Hospice / Palliative Care   Post Acute Care Choice: Durable Medical Equipment (Oxygen 4L) Living arrangements for the past 2 months: Apartment                                      Prior Living Arrangements/Services Living arrangements for the past 2 months: Apartment Lives with:: Self          Need for Family Participation in Patient Care: Yes (Comment) Care giver support system in place?: Yes (comment)   Criminal Activity/Legal Involvement Pertinent to Current Situation/Hospitalization: No - Comment as needed  Activities of Daily Living   ADL Screening (condition at time of admission) Patient's cognitive ability adequate to safely complete daily activities?: No Is the patient deaf or have difficulty hearing?: No Does the patient have difficulty seeing, even when wearing glasses/contacts?: No Does the patient have difficulty concentrating, remembering, or making decisions?: No Patient able to express need for assistance with ADLs?: Yes Does the patient have difficulty dressing or bathing?: Yes Independently performs ADLs?: Yes (appropriate for developmental age) Does the patient have difficulty walking or climbing  stairs?: Yes  Permission Sought/Granted Permission sought to share information with : Facility Medical sales representative    Share Information with NAME: Virginia [name], Virginia [name] (Son)   [phone]           Emotional Assessment Appearance:: Appears stated age Attitude/Demeanor/Rapport: Unable to Assess Affect (typically observed): Unable to Assess   Alcohol / Substance Use: Not Applicable Psych Involvement: No (comment)  Admission diagnosis:  Respiratory failure (HCC) [J96.90] COPD exacerbation (HCC) [J44.1] Acute respiratory failure with hypoxia and hypercapnia (HCC) [J96.01, J96.02] Patient Active Problem List   Diagnosis Date Noted  . COPD exacerbation (HCC)   . Respiratory failure (HCC) 06/06/2020  . Acute respiratory failure with hypoxia (HCC) 06/02/2020  . COPD with acute exacerbation (HCC) 06/02/2020  . Shortness of breath 06/02/2020  . GERD (gastroesophageal reflux disease) 06/02/2020  . Iron deficiency anemia 01/22/2020  . Anemia 01/17/2019  . Coronary artery disease involving native coronary artery of native heart 04/30/2018  . Palpitations 04/30/2018  . Chronic heartburn 04/12/2018  . Other dysphagia 04/12/2018  . Thoracic aortic aneurysm without rupture (HCC) 08/27/2017  . 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 SOBOE (shortness of breath on exertion) 01/24/2017  . Paroxysmal A-fib (HCC) 11/09/2016  . Chronic hypoxemic respiratory failure (HCC) 12/06/2015  . Depression with anxiety 12/06/2015  . Fibromyalgia 12/06/2015  . Hep C w/o coma, chronic (HCC) 12/06/2015  . History of tracheostomy 12/06/2015  . HTN, goal below 140/80  12/06/2015  . Localized osteoporosis without current pathological fracture 12/06/2015  . PEG (percutaneous endoscopic gastrostomy) status (HCC) 12/06/2015  . Peripheral neuropathy, idiopathic 12/06/2015  . RLS (restless legs syndrome) 12/06/2015  . 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  . COPD (chronic  obstructive pulmonary disease) (HCC) 11/29/2015   PCP:  Marguarite Arbour, MD Pharmacy:   [facility] [phone] [place], [place] - [address] AT [facility] OF [address] [place] [address] [place] [address] Phone: [phone] Fax: [phone]  [facility] #[id number] [place], [place] - [address] AT [facility] OF [address] [facility] [address] [place] [address] Phone: [phone] Fax: [phone]     Social Determinants of Health (SDOH) Interventions    Readmission Risk Interventions No flowsheet data found.

## 2020-06-12 NOTE — Consult Note (Signed)
Virginia [name], Virginia [name] [id number] 07/11/1950 Virginia Nearing, MD  Reason for Consult: Virginia [name] Requesting Physician: Virginia Lipps, MD Consulting Physician: Virginia Nearing, MD  HPI: This 69 y.o. year old female was admitted on 06/06/2020 for Respiratory failure ([place]) [J96.90] COPD exacerbation ([place]) [J44.1] Acute respiratory failure with hypoxia and hypercapnia ([place]) [J96.01, J96.02]. This is a 69 yo female who was recently hospitalized from 12/10 to 12/12 following treatment of AECOPD.  She presented back to [facility] ER on 12/14 via EMS with altered mental status, cough, worsening shortness of breath, and wheezing. Pts daughter reported she had been taking narcotics along with seroquel. During her course in the ER she required mechanical intubation. COVID-19/Influenza PCR and CXR negative.  She has failed attempts at weaning since her admission and tracheostomy has been requested by the primary service.  Medications:  Current Facility-Administered Medications  Medication Dose Route Frequency Provider Last Rate Last Admin  . 0.9 %  sodium chloride infusion  250 mL Intravenous Continuous Duffy Bruce, MD 10 mL/hr at 06/10/20 1700 Infusion Verify at 06/10/20 1700  . 0.9 %  sodium chloride infusion  250 mL Intravenous PRN Virginia Lipps, MD      . 0.9 %  sodium chloride infusion  250 mL Intravenous Continuous Virginia Lipps, MD 10 mL/hr at 06/11/20 1800 Infusion Verify at 06/11/20 1800  . acetaminophen (TYLENOL) 160 MG/5ML solution 650 mg  650 mg Per Tube Q4H PRN Virginia Lipps, MD      . amiodarone (NEXTERONE PREMIX) 360-4.14 MG/200ML-% (1.8 mg/mL) IV infusion  30 mg/hr Intravenous Continuous Virginia Lipps, MD   Stopped at 06/10/20 1127  . amitriptyline (ELAVIL) tablet 25 mg  25 mg Per Tube QHS Virginia Lipps, MD      . apixaban (ELIQUIS) tablet 5 mg  5 mg Per Tube BID Virginia Lipps, MD   5 mg at 06/11/20 2247  . budesonide (PULMICORT) nebulizer solution 0.5 mg  0.5 mg Nebulization BID Virginia Bill, NP   0.5  mg at 06/11/20 1910  . calcium-vitamin D (OSCAL WITH D) 500-200 MG-UNIT per tablet 1 tablet  1 tablet Per Tube Q breakfast Virginia Lipps, MD   1 tablet at 06/11/20 0753  . chlorhexidine gluconate (MEDLINE KIT) (PERIDEX) 0.12 % solution 15 mL  15 mL Mouth Rinse BID Virginia Lipps, MD   15 mL at 06/11/20 2010  . Chlorhexidine Gluconate Cloth 2 % PADS 6 each  6 each Topical Daily Virginia Lipps, MD   6 each at 06/11/20 1500  . diazepam (VALIUM) tablet 5 mg  5 mg Per Tube Q8H PRN Rust-Chester, Toribio Harbour L, NP   5 mg at 06/11/20 0523  . diltiazem (CARDIZEM) tablet 30 mg  30 mg Per Tube Q6H BelueAlver Sorrow, RPH   30 mg at 06/12/20 0001  . docusate (COLACE) 50 MG/5ML liquid 100 mg  100 mg Per Tube BID Renda Rolls, RPH      . feeding supplement (VITAL AF 1.2 CAL) liquid 1,000 mL  1,000 mL Per Tube Continuous Rust-Chester, Britton L, NP 35 mL/hr at 06/11/20 0800 Rate Change at 06/11/20 0800  . fentaNYL 2514mg in NS 2532m(1028mml) infusion-PREMIX  0-400 mcg/hr Intravenous Continuous KasFlora LippsD 35 mL/hr at 06/12/20 0356 350 mcg/hr at 06/12/20 0356  . gabapentin (NEURONTIN) capsule 300 mg  300 mg Per Tube TID KasFlora LippsD   300 mg at 06/11/20 2247  . insulin aspart (novoLOG) injection 0-9 Units  0-9 Units Subcutaneous Q4H Rust-Chester, BriHuel CoteP   1  Units at 06/12/20 0001  . ipratropium-albuterol (DUONEB) 0.5-2.5 (3) MG/3ML nebulizer solution 3 mL  3 mL Nebulization Q6H Virginia Bill, NP   3 mL at 06/12/20 0214  . ipratropium-albuterol (DUONEB) 0.5-2.5 (3) MG/3ML nebulizer solution 3 mL  3 mL Nebulization Q6H PRN Virginia Bill, NP      . MEDLINE mouth rinse  15 mL Mouth Rinse 10 times per day Virginia Lipps, MD   15 mL at 06/12/20 0215  . methylPREDNISolone sodium succinate (SOLU-MEDROL) 40 mg/mL injection 40 mg  40 mg Intravenous Q12H Virginia Bill, NP   40 mg at 06/11/20 2247  . metoprolol tartrate (LOPRESSOR) tablet 50 mg  50 mg Per Tube BID Virginia Lipps, MD   50 mg at 06/11/20 2247  .  norepinephrine (LEVOPHED) 16 mg in 233m premix infusion  0-40 mcg/min Intravenous Titrated Rust-Chester, Britton L, NP 2.81 mL/hr at 06/11/20 1800 3 mcg/min at 06/11/20 1800  . ondansetron (ZOFRAN) injection 4 mg  4 mg Intravenous Q6H PRN KFlora Lipps MD      . pantoprazole (PROTONIX) injection 40 mg  40 mg Intravenous QHS KFlora Lipps MD   40 mg at 06/11/20 2247  . polyethylene glycol (MIRALAX / GLYCOLAX) packet 17 g  17 g Per Tube Daily PRN KFlora Lipps MD      . polyethylene glycol (MIRALAX / GLYCOLAX) packet 17 g  17 g Per Tube Daily KFlora Lipps MD   17 g at 06/11/20 1129  . propofol (DIPRIVAN) 1000 MG/100ML infusion  5-80 mcg/kg/min Intravenous Titrated KFlora Lipps MD 22.5 mL/hr at 06/12/20 0317 60 mcg/kg/min at 06/12/20 0317  . QUEtiapine (SEROQUEL) tablet 25 mg  25 mg Per Tube QHS KFlora Lipps MD      . sodium chloride flush (NS) 0.9 % injection 10-40 mL  10-40 mL Intracatheter Q12H KFlora Lipps MD   10 mL at 06/11/20 2249  . sodium chloride flush (NS) 0.9 % injection 10-40 mL  10-40 mL Intracatheter PRN KFlora Lipps MD      . sodium chloride flush (NS) 0.9 % injection 3 mL  3 mL Intravenous Q12H KFlora Lipps MD   3 mL at 06/11/20 2249  . sodium chloride flush (NS) 0.9 % injection 3 mL  3 mL Intravenous PRN KFlora Lipps MD      .  Medications Prior to Admission  Medication Sig Dispense Refill  . albuterol (VENTOLIN HFA) 108 (90 Base) MCG/ACT inhaler Inhale 2 puffs into the lungs every 4 (four) hours as needed for wheezing or shortness of breath.    .Marland Kitchenamitriptyline (ELAVIL) 25 MG tablet Take 25 mg by mouth at bedtime.     . diazepam (VALIUM) 5 MG tablet Take 5 mg by mouth every 8 (eight) hours as needed for anxiety.   5  . diltiazem (CARDIZEM CD) 120 MG 24 hr capsule Take 120 mg by mouth daily.    .Marland Kitchenestradiol (ESTRACE) 1 MG tablet Take 1 mg by mouth daily.  1  . furosemide (LASIX) 20 MG tablet Take 20 mg by mouth daily.   1  . gabapentin (NEURONTIN) 300 MG capsule Take 300 mg  by mouth 3 (three) times daily.    .Marland Kitchenlisinopril (ZESTRIL) 10 MG tablet Take 10 mg by mouth daily.    .Marland KitchenMAGNESIUM-OXIDE 400 (241.3 Mg) MG tablet Take 400 mg by mouth daily.   1  . metoprolol tartrate (LOPRESSOR) 50 MG tablet Take 50 mg by mouth 2 (two) times daily.    . Multiple  Vitamin (MULTIVITAMIN) tablet Take 1 tablet by mouth daily.    Marland Kitchen omeprazole (PRILOSEC) 40 MG capsule Take 40 mg by mouth daily.    Marland Kitchen oxyCODONE-acetaminophen (PERCOCET) 10-325 MG tablet Take 1 tablet by mouth 3 (three) times daily.    . predniSONE (DELTASONE) 5 MG tablet Take 5 mg by mouth daily.    . QUEtiapine (SEROQUEL) 25 MG tablet Take 25 mg by mouth at bedtime.   1  . TRELEGY ELLIPTA 100-62.5-25 MCG/INH AEPB Inhale 1 puff into the lungs daily.    Marland Kitchen apixaban (ELIQUIS) 5 MG TABS tablet Take 1 tablet (5 mg total) by mouth 2 (two) times daily. 60 tablet 0  . levofloxacin (LEVAQUIN) 500 MG tablet Take 1 tablet (500 mg total) by mouth daily for 10 days. 5 tablet 0  . predniSONE (DELTASONE) 10 MG tablet Take 4 tablets (40 mg total) by mouth daily. 20 tablet 0    Allergies: No Known Allergies  PMH:  Past Medical History:  Diagnosis Date  . Anemia   . Anxiety   . Arthritis   . Atrial fibrillation ([place])   . C. difficile colitis 09/2015  . COPD (chronic obstructive pulmonary disease) ([place])   . Depression   . Dyspnea   . Dysrhythmia   . Fibromyalgia   . H/O tracheostomy   . Hep C w/ coma, chronic   . Hypertension   . MRSA pneumonia ([place]) 2017  . On home oxygen therapy    3 L/M   . Osteoporosis   . Peripheral neuropathy   . RLS (restless legs syndrome)   . S/P percutaneous endoscopic gastrostomy (PEG) tube placement ([place]) 09/2015  . Ventilator associated pneumonia [facility]) 10/2015   [facility], [place]    Fam Hx:  Family History  Problem Relation Age of Onset  . Hypertension Mother     Soc Hx:  Social History   Socioeconomic History  . Marital status: Single    Spouse name: Not on file  .  Number of children: Not on file  . Years of education: Not on file  . Highest education level: Not on file  Occupational History  . Not on file  Tobacco Use  . Smoking status: Former Smoker    Packs/day: 1.50    Types: Cigarettes    Quit date: 10/05/2015    Years since quitting: 4.6  . Smokeless tobacco: Never Used  Vaping Use  . Vaping Use: Some days  . Last attempt to quit: 10/05/2015  Substance and Sexual Activity  . Alcohol use: No  . Drug use: No  . Sexual activity: Not on file  Other Topics Concern  . Not on file  Social History Narrative  . Not on file   Social Determinants of Health   Financial Resource Strain: Not on file  Food Insecurity: Not on file  Transportation Needs: Not on file  Physical Activity: Not on file  Stress: Not on file  Social Connections: Not on file  Intimate Partner Violence: Not on file    PSH:  Past Surgical History:  Procedure Laterality Date  . ABDOMINAL HYSTERECTOMY    . BREAST SURGERY Bilateral    Breast Implants  . DILATION AND CURETTAGE OF UTERUS    . REVERSE SHOULDER ARTHROPLASTY Right 03/06/2017   Procedure: REVERSE SHOULDER ARTHROPLASTY;  Surgeon: Corky Mull, MD;  Location: [facility] ORS;  Service: Orthopedics;  Laterality: Right;  . ROTATOR CUFF REPAIR Bilateral   . Procedures since admission: No admission procedures for hospital encounter.  ROS: Review of systems normal other than 12 systems except per HPI.  PHYSICAL EXAM  Vitals: Blood pressure 96/63, pulse 64, temperature 97.9 F (36.6 C), resp. rate 16, height 5' 2.01" (1.575 m), weight 62.6 kg, SpO2 97 %.. General: Well-developed, Well-nourished on vent and sedated Mood: Unable to assess. Patient on vent and sedated Orientation: Patient on vent and sedated. Vocal Quality: Unable to assess. Patient intubated, on vent and sedated Head and Face: NCAT. No facial asymmetry. No visible skin lesions. No significant facial scars. No tenderness with sinus percussion. Facial  strength normal and symmetric. Ears: External ears with normal landmarks, no lesions. External auditory canals free of infection, cerumen impaction or lesions. Tympanic membranes intact with good landmarks and normal mobility on pneumatic otoscopy. No middle ear effusion. Hearing: Unable to assess. Patient on vent and sedated Nose: External nose normal with midline dorsum and no lesions or deformity. Nasal Cavity reveals essentially midline septum with normal inferior turbinates. No significant mucosal congestion or erythema. Nasal secretions are minimal and clear. No polyps seen on anterior rhinoscopy. Oral Cavity/ Oropharynx: Lips are normal with no lesions. Teeth no frank dental caries visualized and gingiva appear normal. Visualized tongue, floor of mouth and pharynx appear normal, though exam is limited due to presence of endotracheal tube. Indirect Laryngoscopy/Nasopharyngoscopy: Visualization of the larynx, hypopharynx and nasopharynx is not possible in this setting with routine examination. Neck: Supple and symmetric with no palpable masses, tenderness or crepitance. The trachea is midline. Thyroid gland is soft, nontender and symmetric with no masses or enlargement. Parotid and submandibular glands are soft, nontender and symmetric, without masses. Lymphatic: Cervical lymph nodes are without palpable lymphadenopathy or tenderness. Respiratory: Patient on ventilator for respiratory support Cardiovascular: Carotid pulse shows regular rate and rhythm Neurologic: Unable to assess. Patient on vent and sedated Eyes: Unable to assess. Patient on vent and sedated  MEDICAL DECISION MAKING: Data Review:  Results for orders placed or performed during the hospital encounter of 06/06/20 (from the past 48 hour(s))  Glucose, capillary     Status: None   Collection Time: 06/10/20  7:45 AM  Result Value Ref Range   Glucose-Capillary 84 70 - 99 mg/dL    Comment: Glucose reference range applies only to  samples taken after fasting for at least 8 hours.  Glucose, capillary     Status: None   Collection Time: 06/10/20 11:11 AM  Result Value Ref Range   Glucose-Capillary 85 70 - 99 mg/dL    Comment: Glucose reference range applies only to samples taken after fasting for at least 8 hours.  Troponin I (High Sensitivity)     Status: Abnormal   Collection Time: 06/10/20 11:46 AM  Result Value Ref Range   Troponin I (High Sensitivity) 142 (HH) <18 ng/L    Comment: CRITICAL RESULT CALLED TO, READ BACK BY AND VERIFIED WITH JAMIE LOMAX '@[id number]'  06/10/20 MJU (NOTE) Elevated high sensitivity troponin I (hsTnI) values and significant  changes across serial measurements may suggest ACS but many other  chronic and acute conditions are known to elevate hsTnI results.  Refer to the "Links" section for chest pain algorithms and additional  guidance. Performed at [facility], [address] [place], [place] [address]   Triglycerides     Status: Abnormal   Collection Time: 06/10/20 12:00 PM  Result Value Ref Range   Triglycerides 176 (H) <150 mg/dL    Comment: Performed at [facility], [address], [place], [place] [address]  Troponin I (High  Sensitivity)     Status: Abnormal   Collection Time: 06/10/20  1:52 PM  Result Value Ref Range   Troponin I (High Sensitivity) 159 (HH) <18 ng/L    Comment: CRITICAL VALUE NOTED. VALUE IS CONSISTENT WITH PREVIOUSLY REPORTED/CALLED VALUE MJU (NOTE) Elevated high sensitivity troponin I (hsTnI) values and significant  changes across serial measurements may suggest ACS but many other  chronic and acute conditions are known to elevate hsTnI results.  Refer to the "Links" section for chest pain algorithms and additional  guidance. Performed at [facility], [address] [place], [place] [address]   Blood gas, arterial     Status: Abnormal   Collection Time: 06/10/20  2:46 PM  Result Value Ref Range   FIO2 60.00    Delivery  systems VENTILATOR    Mode PRESSURE REGULATED VOLUME CONTROL    VT 530 mL   LHR 14 resp/min   Peep/cpap 5.0 cm H20   pH, Arterial 7.39 7.350 - 7.450   pCO2 arterial 53 (H) 32.0 - 48.0 mmHg   pO2, Arterial 230 (H) 83.0 - 108.0 mmHg   Bicarbonate 32.1 (H) 20.0 - 28.0 mmol/L   Acid-Base Excess 6.2 (H) 0.0 - 2.0 mmol/L   O2 Saturation 99.8 %   Patient temperature 37.0    Collection site RIGHT RADIAL    Sample type ARTERIAL DRAW    Allens test (pass/fail) PASS PASS    Comment: Performed at [facility], [address] [place], [place] [address]  Glucose, capillary     Status: None   Collection Time: 06/10/20  4:03 PM  Result Value Ref Range   Glucose-Capillary 84 70 - 99 mg/dL    Comment: Glucose reference range applies only to samples taken after fasting for at least 8 hours.  Glucose, capillary     Status: None   Collection Time: 06/10/20  7:24 PM  Result Value Ref Range   Glucose-Capillary 76 70 - 99 mg/dL    Comment: Glucose reference range applies only to samples taken after fasting for at least 8 hours.  Glucose, capillary     Status: None   Collection Time: 06/10/20 11:29 PM  Result Value Ref Range   Glucose-Capillary 94 70 - 99 mg/dL    Comment: Glucose reference range applies only to samples taken after fasting for at least 8 hours.  Glucose, capillary     Status: Abnormal   Collection Time: 06/11/20  3:35 AM  Result Value Ref Range   Glucose-Capillary 140 (H) 70 - 99 mg/dL    Comment: Glucose reference range applies only to samples taken after fasting for at least 8 hours.  Magnesium     Status: None   Collection Time: 06/11/20  4:55 AM  Result Value Ref Range   Magnesium 2.0 1.7 - 2.4 mg/dL    Comment: Performed at [facility], [address] [place], [place] [address]  Phosphorus     Status: None   Collection Time: 06/11/20  4:55 AM  Result Value Ref Range   Phosphorus 3.0 2.5 - 4.6 mg/dL    Comment: Performed at [facility], [address] [place], [place] [address]  CBC     Status: Abnormal   Collection Time: 06/11/20  4:55 AM  Result Value Ref Range   WBC 11.5 (H) 4.0 - 10.5 K/uL   RBC 2.68 (L) 3.87 - 5.11 MIL/uL   Hemoglobin 8.2 (L) 12.0 - 15.0 g/dL   HCT 26.8 (L) 36.0 -  46.0 %   MCV 100.0 80.0 - 100.0 fL   MCH 30.6 26.0 - 34.0 pg   MCHC 30.6 30.0 - 36.0 g/dL   RDW 14.3 11.5 - 15.5 %   Platelets 298 150 - 400 K/uL   nRBC 0.2 0.0 - 0.2 %    Comment: Performed at [facility], [address], [place], [place] [address]  Basic metabolic panel     Status: Abnormal   Collection Time: 06/11/20  4:55 AM  Result Value Ref Range   Sodium 142 135 - 145 mmol/L   Potassium 4.4 3.5 - 5.1 mmol/L   Chloride 103 98 - 111 mmol/L   CO2 29 22 - 32 mmol/L   Glucose, Bld 167 (H) 70 - 99 mg/dL    Comment: Glucose reference range applies only to samples taken after fasting for at least 8 hours.   BUN 35 (H) 8 - 23 mg/dL   Creatinine, Ser 1.02 (H) 0.44 - 1.00 mg/dL   Calcium 9.4 8.9 - 10.3 mg/dL   GFR, Estimated 60 (L) >60 mL/min    Comment: (NOTE) Calculated using the CKD-EPI Creatinine Equation (2021)    Anion gap 10 5 - 15    Comment: Performed at [facility], [address] [place], [place] [address]  Glucose, capillary     Status: Abnormal   Collection Time: 06/11/20  7:42 AM  Result Value Ref Range   Glucose-Capillary 161 (H) 70 - 99 mg/dL    Comment: Glucose reference range applies only to samples taken after fasting for at least 8 hours.  Glucose, capillary     Status: Abnormal   Collection Time: 06/11/20 11:50 AM  Result Value Ref Range   Glucose-Capillary 186 (H) 70 - 99 mg/dL    Comment: Glucose reference range applies only to samples taken after fasting for at least 8 hours.  Glucose, capillary     Status: Abnormal   Collection Time: 06/11/20  3:58 PM  Result Value Ref Range   Glucose-Capillary 172 (H) 70 - 99 mg/dL    Comment: Glucose reference range applies only to samples taken  after fasting for at least 8 hours.  Glucose, capillary     Status: Abnormal   Collection Time: 06/11/20  7:44 PM  Result Value Ref Range   Glucose-Capillary 165 (H) 70 - 99 mg/dL    Comment: Glucose reference range applies only to samples taken after fasting for at least 8 hours.  Glucose, capillary     Status: Abnormal   Collection Time: 06/11/20 11:19 PM  Result Value Ref Range   Glucose-Capillary 146 (H) 70 - 99 mg/dL    Comment: Glucose reference range applies only to samples taken after fasting for at least 8 hours.  Glucose, capillary     Status: Abnormal   Collection Time: 06/12/20  4:03 AM  Result Value Ref Range   Glucose-Capillary 164 (H) 70 - 99 mg/dL    Comment: Glucose reference range applies only to samples taken after fasting for at least 8 hours.  Darletta Moll Abd 1 View  Result Date: 06/10/2020 CLINICAL DATA:  Status post NG tube placement. EXAM: ABDOMEN - 1 VIEW COMPARISON:  None. FINDINGS: NG tube tip is in the stomach with the side port at the gastroesophageal junction. Recommend advancement of 3 cm. IMPRESSION: As above. Electronically Signed   By: Inge Rise M.D.   On: 06/10/2020 15:06   DG Chest Port 1 View  Result Date: 06/11/2020 CLINICAL DATA:  Acute  respiratory failure. Endotracheally intubated. EXAM: PORTABLE CHEST 1 VIEW COMPARISON:  06/10/2020 FINDINGS: Support lines and tubes in appropriate position. Heart size is within normal limits. Tortuosity of thoracic aorta again noted. Pulmonary hyperinflation is again seen. No evidence of acute infiltrate or pleural effusion. IMPRESSION: Pulmonary hyperinflation.  No active lung disease. Electronically Signed   By: Marlaine Hind M.D.   On: 06/11/2020 09:09   DG Chest Port 1 View  Result Date: 06/10/2020 CLINICAL DATA:  Status post intubation and central line placement. EXAM: PORTABLE CHEST 1 VIEW COMPARISON:  Single-view of the chest 06/06/2020. FINDINGS: Endotracheal tube is in place with the tip 2.6 cm above the  carina. Left IJ central venous catheter tip is in the upper superior vena cava. NG tube courses into the stomach and below the inferior margin of the film. Lungs are clear. No pneumothorax or pleural effusion. Heart size is normal. Aortic atherosclerosis. IMPRESSION: ETT in good position. Tip of right IJ catheter is in the upper superior vena cava. No pneumothorax. No acute cardiopulmonary disease. Electronically Signed   By: Inge Rise M.D.   On: 06/10/2020 15:05  .   ASSESSMENT: Respiratory failure  PLAN: Critical care consult involving over 31 minutes of consultation, chart review, examination, discussion with family, and care coordination. Will schedule tracheostomy. Risks including bleeding, infection, scarring, recurrent laryngeal nerve injury to be discussed with patient's family and consent obtained. Primary service will need to hold Eliquis and switch to short acting management such as heparin.   Virginia Nearing, MD 06/12/2020 4:24 AM

## 2020-06-12 NOTE — Telephone Encounter (Signed)
pts daughter in law [name] called to cx pts 06/13/20 lab/MD/Feraheme appts due to pt being admitted in ICU and stated that she does not wish to have appt R/S.

## 2020-06-13 ENCOUNTER — Inpatient Hospital Stay: Payer: Medicare Other

## 2020-06-13 ENCOUNTER — Encounter
Admission: EM | Disposition: A | Discharge status: Home Health | Payer: Self-pay | Source: Home / Self Care | Attending: Internal Medicine

## 2020-06-13 ENCOUNTER — Inpatient Hospital Stay: Payer: Medicare Other | Admitting: Oncology

## 2020-06-13 LAB — GLUCOSE, CAPILLARY
Glucose-Capillary: 113 mg/dL — ABNORMAL HIGH (ref 70–99)
Glucose-Capillary: 111 mg/dL — ABNORMAL HIGH (ref 70–99)
Glucose-Capillary: 112 mg/dL — ABNORMAL HIGH (ref 70–99)
Glucose-Capillary: 125 mg/dL — ABNORMAL HIGH (ref 70–99)
Glucose-Capillary: 165 mg/dL — ABNORMAL HIGH (ref 70–99)
Glucose-Capillary: 122 mg/dL — ABNORMAL HIGH (ref 70–99)
Glucose-Capillary: 149 mg/dL — ABNORMAL HIGH (ref 70–99)

## 2020-06-13 LAB — BLOOD GAS, ARTERIAL
Acid-Base Excess: 5.2 mmol/L — ABNORMAL HIGH (ref 0.0–2.0)
Bicarbonate: 33.9 mmol/L — ABNORMAL HIGH (ref 20.0–28.0)
FIO2: 0.3
O2 Saturation: 87.4 %
PEEP: 5 cmH2O
Patient temperature: 37
Pressure support: 5 cmH2O
pCO2 arterial: 79 mmHg (ref 32.0–48.0)
pH, Arterial: 7.24 — ABNORMAL LOW (ref 7.350–7.450)
pO2, Arterial: 63 mmHg — ABNORMAL LOW (ref 83.0–108.0)

## 2020-06-13 LAB — BASIC METABOLIC PANEL
Anion gap: 5 (ref 5–15)
BUN: 40 mg/dL — ABNORMAL HIGH (ref 8–23)
CO2: 32 mmol/L (ref 22–32)
Calcium: 8.9 mg/dL (ref 8.9–10.3)
Chloride: 103 mmol/L (ref 98–111)
Creatinine, Ser: 0.92 mg/dL (ref 0.44–1.00)
GFR, Estimated: >60 mL/min (ref >60)
Glucose, Bld: 114 mg/dL — ABNORMAL HIGH (ref 70–99)
Potassium: 4.6 mmol/L (ref 3.5–5.1)
Sodium: 140 mmol/L (ref 135–145)

## 2020-06-13 LAB — PHOSPHORUS: Phosphorus: 4.1 mg/dL (ref 2.5–4.6)

## 2020-06-13 LAB — APTT
aPTT: 134 seconds — ABNORMAL HIGH (ref 24–36)
aPTT: 131 seconds — ABNORMAL HIGH (ref 24–36)
aPTT: 103 seconds — ABNORMAL HIGH (ref 24–36)

## 2020-06-13 LAB — CBC
HCT: 28.6 % — ABNORMAL LOW (ref 36.0–46.0)
Hemoglobin: 8.4 g/dL — ABNORMAL LOW (ref 12.0–15.0)
MCH: 30.5 pg (ref 26.0–34.0)
MCHC: 29.4 g/dL — ABNORMAL LOW (ref 30.0–36.0)
MCV: 104 fL — ABNORMAL HIGH (ref 80.0–100.0)
Platelets: 313 10*3/uL (ref 150–400)
RBC: 2.75 MIL/uL — ABNORMAL LOW (ref 3.87–5.11)
RDW: 14.6 % (ref 11.5–15.5)
WBC: 22.1 10*3/uL — ABNORMAL HIGH (ref 4.0–10.5)
nRBC: 0.7 % — ABNORMAL HIGH (ref 0.0–0.2)

## 2020-06-13 LAB — MAGNESIUM: Magnesium: 2.1 mg/dL (ref 1.7–2.4)

## 2020-06-13 LAB — TRIGLYCERIDES: Triglycerides: 130 mg/dL (ref <150)

## 2020-06-13 LAB — HEPARIN LEVEL (UNFRACTIONATED): Heparin Unfractionated: 2.76 IU/mL — ABNORMAL HIGH (ref 0.30–0.70)

## 2020-06-13 SURGERY — CREATION, TRACHEOSTOMY
Anesthesia: General

## 2020-06-13 MED ORDER — DEXTROSE 10 % IV SOLN: INTRAVENOUS | Status: DC

## 2020-06-13 NOTE — Progress Notes (Signed)
ANTICOAGULATION CONSULT NOTE  Pharmacy Consult for Heparin drip (was on apixaban-last dose12/19 @ 2247)  Indication: atrial fibrillation  No Known Allergies  Patient Measurements: Height: 5' 2.01" (157.5 cm) Weight: 62.6 kg (138 lb 0.1 oz) IBW/kg (Calculated) : 50.12 Heparin Dosing Weight: 62.6 kg   Vital Signs: Temp: 99.32 F (37.4 C) (12/21 1000) BP: 100/71 (12/21 1000) Pulse Rate: 91 (12/21 1000)  Labs: Recent Labs    06/10/20 1352 06/11/20 0455 06/11/20 0455 06/12/20 0442 06/12/20 0951 06/12/20 0951 06/12/20 1753 06/13/20 0102 06/13/20 0606 06/13/20 0820  HGB  --  8.2*   < > 8.4*  --   --   --   --   --  8.4*  HCT  --  26.8*  --  27.0*  --   --   --   --   --  28.6*  PLT  --  298  --  348  --   --   --   --   --  313  APTT  --   --   --   --  <24*   < > 66* 134*  --  131*  HEPARINUNFRC  --   --   --   --  >3.60*  --   --   --  2.76*  --   CREATININE  --  1.02*  --  0.93  --   --   --   --  0.92  --   TROPONINIHS 159*  --   --   --   --   --   --   --   --   --    < > = values in this interval not displayed.    Estimated Creatinine Clearance: 50.2 mL/min (by C-G formula based on SCr of 0.92 mg/dL).  Medical History: Past Medical History:  Diagnosis Date  . Anemia   . Anxiety   . Arthritis   . Atrial fibrillation ([facility])   . C. difficile colitis 09/2015  . COPD (chronic obstructive pulmonary disease) ([facility])   . Depression   . Dyspnea   . Dysrhythmia   . Fibromyalgia   . H/O tracheostomy   . Hep C w/ coma, chronic   . Hypertension   . MRSA pneumonia ([facility]) 2017  . On home oxygen therapy    3 L/M   . Osteoporosis   . Peripheral neuropathy   . RLS (restless legs syndrome)   . S/P percutaneous endoscopic gastrostomy (PEG) tube placement ([facility]) 09/2015  . Ventilator associated pneumonia [facility]) 10/2015   [facility], [place]    Assessment: Pharmacy consulted to dose heparin for AFib in this 69 year old female , was on Eliquis 5 mg PO BID prior but  d/c'd due to upcoming tracheostomy procedure.   Will cover with Heparin until tracheostomy is done.  Last dose of Eliquis was on 12/19 @ 2247.   Goal of Therapy:  HL:  0.3 - 0.7 APTT = 66 - 102  Monitor platelets by anticoagulation protocol: Yes   Plan:  12/21 0820 aPTT 131 remains supratherapeutic. Decrease heparin drip to 850 units/hr. Recheck aPTT at 1700. Continue to follow aPTT as HL this morning 2.76. Will check HL and CBC with morning labs.    Pricilla Riffle, PharmD 06/13/2020,12:15 PM

## 2020-06-13 NOTE — Progress Notes (Signed)
CRITICAL CARE PROGRESS NOTE    Name: Virginia [name] MRN: [id number] DOB: February 12, 1951     LOS: 7   SUBJECTIVE FINDINGS & SIGNIFICANT EVENTS    Patient description:  69 yo female with severe COPD admitted with acute on chronic hypoxic hypercapnic respiratory failure and acute encephalopathy secondary to narcotic use and AECOPD requiring mechanical intubation  12/14 admitted to ICU for COPD exacerbation 12/15 plan for SAT/SBT 12/16 failed SAT/SBT, will try again today 12/16 s/p extubation to BiPAP 12/17 resp status stilltenuous 12/18 severe resp failure high risk for intubation 12/18 Re intubated for severe COPD 12/19 severe resp  Failure, ENT consulted for trach   06/12/20- sedated to RASS-2.  Plan for trache as per previous discussion with critical care team and family. DCd amio gtt today . Weaning solumedrol to 40 daily from BID. Patient on Levophed and metoprolol we will remove arrythmogenic vasopressor today.   12/21- Trache postponed to 12/23 due to anticoagulation. Patient weaned down to 21% will attempt SBT again today- patient failed with severe hypercapnia, plan continues to be for trache. Family updated today. Poke with Judeen Hammans DIL.   Lines/tubes : Airway 7.5 mm (Active)  Secured at (cm) 22 cm 06/12/20 0807  Measured From Lips 06/12/20 0807  Secured Location Left 06/12/20 0807  Secured By [employer] 06/12/20 0807  Tube Holder Repositioned Yes 06/12/20 0807  Prone position No 06/12/20 0807  Cuff Pressure (cm H2O) 26 cm H2O 06/12/20 0807  Site Condition Dry;Cool 06/12/20 0807     CVC Triple Lumen 06/10/20 Left Internal jugular (Active)  Indication for Insertion or Continuance of Line Chronic illness with exacerbations (CF, Sickle Cell, etc.) 06/11/20 2100  Site Assessment  Clean;Dry;Intact 06/11/20 2100  Proximal Lumen Status Infusing;Flushed;Blood return noted 06/11/20 2100  Medial Lumen Status Infusing 06/11/20 2100  Distal Lumen Status Infusing;Flushed;Blood return noted 06/11/20 2100  Dressing Type Transparent;Occlusive 06/11/20 2100  Dressing Status Clean;Dry;Intact 06/11/20 2100  Antimicrobial disc in place? Yes 06/11/20 2100  Line Care Connections checked and tightened 06/11/20 2100  Dressing Intervention New dressing 06/10/20 2100  Dressing Change Due 06/17/20 06/11/20 2100     NG/OG Tube Orogastric Center mouth Xray (Active)  Cm Marking at Nare/Corner of Mouth (if applicable) 61 cm 85/27/78 2000  Site Assessment Clean;Dry;Intact 06/11/20 2000  Ongoing Placement Verification No change in cm markings or external length of tube from initial placement;No change in respiratory status;No acute changes, not attributed to clinical condition 06/11/20 2000  Status Infusing tube feed 06/11/20 2000  Drainage Appearance None 06/11/20 1600  Intake (mL) 30 mL 06/11/20 1600     Urethral Catheter Skeet Latch, RN Double-lumen;Latex 14 Fr. (Active)  Indication for Insertion or Continuance of Catheter Therapy based on hourly urine output monitoring and documentation for critical condition (NOT STRICT I&O) 06/12/20 0800  Site Assessment Clean;Intact 06/12/20 0800  Catheter Maintenance Bag below level of bladder;Catheter secured;Drainage bag/tubing not touching floor;Insertion date on drainage bag;No dependent loops;Seal intact 06/12/20 0800  Collection Container Standard drainage bag 06/12/20 0800  Securement Method Securing device (Describe) 06/12/20 0800  Urinary Catheter Interventions (if applicable) Unclamped 24/23/53 0800  Output (mL) 60 mL 06/11/20 1800    Microbiology/Sepsis markers: Results for orders placed or performed during the hospital encounter of 06/06/20  Blood culture (routine x 2)     Status: None   Collection Time: 06/06/20 12:07 PM    Specimen: BLOOD  Result Value Ref Range Status   Specimen Description BLOOD RIGHT [facility]  Final  Special Requests   Final    BOTTLES DRAWN AEROBIC AND ANAEROBIC Blood Culture adequate volume   Culture   Final    NO GROWTH 5 DAYS Performed at [facility], [address] [place], [place] [address]    Report Status 06/11/2020 FINAL  Final  Blood culture (routine x 2)     Status: None   Collection Time: 06/06/20 12:07 PM   Specimen: BLOOD  Result Value Ref Range Status   Specimen Description BLOOD LEFT AC  Final   Special Requests   Final    BOTTLES DRAWN AEROBIC AND ANAEROBIC Blood Culture adequate volume   Culture   Final    NO GROWTH 5 DAYS Performed at [facility], [address] [place], [place] [address]    Report Status 06/11/2020 FINAL  Final  Resp Panel by RT-PCR (Flu A&B, Covid) Nasopharyngeal Swab     Status: None   Collection Time: 06/06/20 12:07 PM   Specimen: Nasopharyngeal Swab; Nasopharyngeal(NP) swabs in vial transport medium  Result Value Ref Range Status   SARS Coronavirus 2 by RT PCR NEGATIVE NEGATIVE Final    Comment: (NOTE) SARS-CoV-2 target nucleic acids are NOT DETECTED.  The SARS-CoV-2 RNA is generally detectable in upper respiratory specimens during the acute phase of infection. The lowest concentration of SARS-CoV-2 viral copies this assay can detect is 138 copies/mL. A negative result does not preclude SARS-Cov-2 infection and should not be used as the sole basis for treatment or other patient management decisions. A negative result may occur with  improper specimen collection/handling, submission of specimen other than nasopharyngeal swab, presence of viral mutation(s) within the areas targeted by this assay, and inadequate number of viral copies(<138 copies/mL). A negative result must be combined with clinical observations, patient history, and epidemiological information. The expected result is Negative.  Fact Sheet for  Patients:  EntrepreneurPulse.com.au  Fact Sheet for Healthcare Providers:  IncredibleEmployment.be  This test is no t yet approved or cleared by the [place] FDA and  has been authorized for detection and/or diagnosis of SARS-CoV-2 by FDA under an Emergency Use Authorization (EUA). This EUA will remain  in effect (meaning this test can be used) for the duration of the COVID-19 declaration under Section 564(b)(1) of the Act, 21 U.S.C.section 360bbb-3(b)(1), unless the authorization is terminated  or revoked sooner.       Influenza A by PCR NEGATIVE NEGATIVE Final   Influenza B by PCR NEGATIVE NEGATIVE Final    Comment: (NOTE) The Xpert Xpress SARS-CoV-2/FLU/RSV plus assay is intended as an aid in the diagnosis of influenza from Nasopharyngeal swab specimens and should not be used as a sole basis for treatment. Nasal washings and aspirates are unacceptable for Xpert Xpress SARS-CoV-2/FLU/RSV testing.  Fact Sheet for Patients: EntrepreneurPulse.com.au  Fact Sheet for Healthcare Providers: IncredibleEmployment.be  This test is not yet approved or cleared by the [place] FDA and has been authorized for detection and/or diagnosis of SARS-CoV-2 by FDA under an Emergency Use Authorization (EUA). This EUA will remain in effect (meaning this test can be used) for the duration of the COVID-19 declaration under Section 564(b)(1) of the Act, 21 U.S.C. section 360bbb-3(b)(1), unless the authorization is terminated or revoked.  Performed at [facility], [address] [place], [place] [address]   MRSA PCR Screening     Status: None   Collection Time: 06/06/20 11:00 PM   Specimen: Nasopharyngeal  Result Value Ref Range Status   MRSA by PCR NEGATIVE NEGATIVE  Final    Comment:        The GeneXpert MRSA Assay (FDA approved for NASAL specimens only), is one component of a comprehensive MRSA  colonization surveillance program. It is not intended to diagnose MRSA infection nor to guide or monitor treatment for MRSA infections. Performed at [facility], [address], [place], [place] [address]     Anti-infectives:  Anti-infectives (From admission, onward)   Start     Dose/Rate Route Frequency Ordered Stop   06/07/20 1400  azithromycin (ZITHROMAX) 500 mg in sodium chloride 0.9 % 250 mL IVPB  Status:  Discontinued        500 mg 250 mL/hr over 60 Minutes Intravenous Every 24 hours 06/06/20 2030 06/06/20 2044   06/07/20 1400  azithromycin (ZITHROMAX) 500 mg in sodium chloride 0.9 % 250 mL IVPB  Status:  Discontinued        500 mg 250 mL/hr over 60 Minutes Intravenous Every 24 hours 06/06/20 2044 06/08/20 1031   06/06/20 1415  cefTRIAXone (ROCEPHIN) 2 g in sodium chloride 0.9 % 100 mL IVPB        2 g 200 mL/hr over 30 Minutes Intravenous  Once 06/06/20 1407 06/06/20 1514   06/06/20 1415  azithromycin (ZITHROMAX) 500 mg in sodium chloride 0.9 % 250 mL IVPB        500 mg 250 mL/hr over 60 Minutes Intravenous  Once 06/06/20 1407 06/06/20 1708       Consults:   ENT - tracheostomy  PAST MEDICAL HISTORY   Past Medical History:  Diagnosis Date  . Anemia   . Anxiety   . Arthritis   . Atrial fibrillation ([place])   . C. difficile colitis 09/2015  . COPD (chronic obstructive pulmonary disease) ([place])   . Depression   . Dyspnea   . Dysrhythmia   . Fibromyalgia   . H/O tracheostomy   . Hep C w/ coma, chronic   . Hypertension   . MRSA pneumonia ([place]) 2017  . On home oxygen therapy    3 L/M   . Osteoporosis   . Peripheral neuropathy   . RLS (restless legs syndrome)   . S/P percutaneous endoscopic gastrostomy (PEG) tube placement ([place]) 09/2015  . Ventilator associated pneumonia [facility]) 10/2015   [facility], [place]     SURGICAL HISTORY   Past Surgical History:  Procedure Laterality Date  . ABDOMINAL HYSTERECTOMY    . BREAST SURGERY Bilateral     Breast Implants  . DILATION AND CURETTAGE OF UTERUS    . REVERSE SHOULDER ARTHROPLASTY Right 03/06/2017   Procedure: REVERSE SHOULDER ARTHROPLASTY;  Surgeon: Corky Mull, MD;  Location: [facility] ORS;  Service: Orthopedics;  Laterality: Right;  . ROTATOR CUFF REPAIR Bilateral      FAMILY HISTORY   Family History  Problem Relation Age of Onset  . Hypertension Mother      SOCIAL HISTORY   Social History   Tobacco Use  . Smoking status: Former Smoker    Packs/day: 1.50    Types: Cigarettes    Quit date: 10/05/2015    Years since quitting: 4.6  . Smokeless tobacco: Never Used  Vaping Use  . Vaping Use: Some days  . Last attempt to quit: 10/05/2015  Substance Use Topics  . Alcohol use: No  . Drug use: No     MEDICATIONS   Current Medication:  Current Facility-Administered Medications:  .  0.9 %  sodium chloride infusion, 250 mL, Intravenous, Continuous, Duffy Bruce, MD, Last  Rate: 10 mL/hr at 06/10/20 1700, Infusion Verify at 06/10/20 1700 .  0.9 %  sodium chloride infusion, 250 mL, Intravenous, PRN, Mortimer Fries, Kurian, MD .  0.9 %  sodium chloride infusion, 250 mL, Intravenous, Continuous, Kasa, Kurian, MD, Last Rate: 10 mL/hr at 06/12/20 1104, Infusion Verify at 06/12/20 1104 .  acetaminophen (TYLENOL) 160 MG/5ML solution 650 mg, 650 mg, Per Tube, Q4H PRN, Flora Lipps, MD .  amitriptyline (ELAVIL) tablet 25 mg, 25 mg, Per Tube, QHS, Flora Lipps, MD, 25 mg at 06/12/20 2211 .  calcium-vitamin D (OSCAL WITH D) 500-200 MG-UNIT per tablet 1 tablet, 1 tablet, Per Tube, Q breakfast, Flora Lipps, MD, 1 tablet at 06/12/20 0919 .  chlorhexidine gluconate (MEDLINE KIT) (PERIDEX) 0.12 % solution 15 mL, 15 mL, Mouth Rinse, BID, Kasa, Kurian, MD, 15 mL at 06/12/20 2002 .  Chlorhexidine Gluconate Cloth 2 % PADS 6 each, 6 each, Topical, Daily, Flora Lipps, MD, 6 each at 06/12/20 1002 .  diazepam (VALIUM) tablet 5 mg, 5 mg, Per Tube, Q8H PRN, Rust-Chester, Britton L, NP, 5 mg at 06/11/20  0523 .  diltiazem (CARDIZEM) tablet 30 mg, 30 mg, Per Tube, Q6H, Belue, Alver Sorrow, RPH, 30 mg at 06/13/20 0529 .  docusate (COLACE) 50 MG/5ML liquid 100 mg, 100 mg, Per Tube, BID, Renda Rolls, RPH, 100 mg at 06/12/20 0919 .  feeding supplement (VITAL AF 1.2 CAL) liquid 1,000 mL, 1,000 mL, Per Tube, Continuous, Zackarie Chason, MD, Last Rate: 50 mL/hr at 06/13/20 0244, 1,000 mL at 06/13/20 0244 .  fentaNYL 2553mg in NS 2515m(1055mml) infusion-PREMIX, 0-400 mcg/hr, Intravenous, Continuous, Kasa, Kurian, MD, Last Rate: 35 mL/hr at 06/13/20 0731, 350 mcg/hr at 06/13/20 0731 .  gabapentin (NEURONTIN) capsule 300 mg, 300 mg, Per Tube, TID, KasMortimer Friesurian, MD, 300 mg at 06/12/20 2211 .  heparin ADULT infusion 100 units/mL (25000 units/250m15mdium chloride 0.45%), 850 Units/hr, Intravenous, Continuous, Kasa, Kurian, MD, Last Rate: 10 mL/hr at 06/13/20 0335, 1,000 Units/hr at 06/13/20 0335 .  insulin aspart (novoLOG) injection 0-9 Units, 0-9 Units, Subcutaneous, Q4H, Rust-Chester, Britton L, NP, 1 Units at 06/12/20 2358 .  ipratropium-albuterol (DUONEB) 0.5-2.5 (3) MG/3ML nebulizer solution 3 mL, 3 mL, Nebulization, Q6H, Blakeney, Dana G, NP, 3 mL at 06/13/20 0826 .  ipratropium-albuterol (DUONEB) 0.5-2.5 (3) MG/3ML nebulizer solution 3 mL, 3 mL, Nebulization, Q6H PRN, BlakAwilda Bill .  MEDLINE mouth rinse, 15 mL, Mouth Rinse, 10 times per day, KasaFlora Lipps, 15 mL at 06/13/20 0600 .  methylPREDNISolone sodium succinate (SOLU-MEDROL) 40 mg/mL injection 40 mg, 40 mg, Intravenous, Q24H, Rohit Deloria, MD .  metoprolol tartrate (LOPRESSOR) tablet 50 mg, 50 mg, Per Tube, BID, KasaFlora Lipps, 50 mg at 06/12/20 2211 .  midodrine (PROAMATINE) tablet 10 mg, 10 mg, Per Tube, TID WC, Nemiah Bubar, MD, 10 mg at 06/12/20 1505 .  norepinephrine (LEVOPHED) 16 mg in 250mL36mmix infusion, 0-40 mcg/min, Intravenous, Titrated, Rust-Chester, Britton L, NP, Last Rate: 4.69 mL/hr at 06/12/20 1104, 5 mcg/min at  06/12/20 1104 .  ondansetron (ZOFRAN) injection 4 mg, 4 mg, Intravenous, Q6H PRN, Kasa,Mortimer Friesian, MD .  pantoprazole (PROTONIX) injection 40 mg, 40 mg, Intravenous, QHS, Kasa, Kurian, MD, 40 mg at 06/12/20 2238 .  polyethylene glycol (MIRALAX / GLYCOLAX) packet 17 g, 17 g, Per Tube, Daily PRN, Kasa,Mortimer Friesian, MD .  polyethylene glycol (MIRALAX / GLYCOLAX) packet 17 g, 17 g, Per Tube, Daily, Kasa,Flora Lipps 17 g at 06/12/20 0919 .  propofol (DIPRIVAN) 1000  MG/100ML infusion, 5-80 mcg/kg/min, Intravenous, Titrated, Kasa, Kurian, MD, Last Rate: 18.78 mL/hr at 06/13/20 0604, 50 mcg/kg/min at 06/13/20 0604 .  QUEtiapine (SEROQUEL) tablet 25 mg, 25 mg, Per Tube, QHS, Flora Lipps, MD, 25 mg at 06/12/20 2211 .  sodium chloride flush (NS) 0.9 % injection 10-40 mL, 10-40 mL, Intracatheter, Q12H, Kasa, Kurian, MD, 10 mL at 06/12/20 2238 .  sodium chloride flush (NS) 0.9 % injection 10-40 mL, 10-40 mL, Intracatheter, PRN, Mortimer Fries, Kurian, MD .  sodium chloride flush (NS) 0.9 % injection 3 mL, 3 mL, Intravenous, Q12H, Kasa, Kurian, MD, 3 mL at 06/12/20 2238 .  sodium chloride flush (NS) 0.9 % injection 3 mL, 3 mL, Intravenous, PRN, Flora Lipps, MD    ALLERGIES   Patient has no known allergies.    REVIEW OF SYSTEMS    Unable to obtain on sedation and mechanical ventilation  PHYSICAL EXAMINATION   Vital Signs: Temp:  [98.8 F (37.1 C)-99.5 F (37.5 C)] 98.96 F (37.2 C) (12/21 0800) Pulse Rate:  [68-83] 83 (12/21 0829) Resp:  [14-28] 23 (12/21 0829) BP: (84-105)/(56-70) 84/60 (12/21 0800) SpO2:  [91 %-100 %] 93 % (12/21 0829) FiO2 (%):  [30 %] 30 % (12/21 0829)  GENERAL:age appropriate sedated on MV HEAD: Normocephalic, atraumatic.  EYES: Pupils equal, round, reactive to light.  No scleral icterus.  MOUTH: Moist mucosal membrane. NECK: Supple. No thyromegaly. No nodules. No JVD.  PULMONARY: mild rhonchi CARDIOVASCULAR: S1 and S2. Regular rate and rhythm. No murmurs, rubs, or gallops.   GASTROINTESTINAL: Soft, nontender, non-distended. No masses. Positive bowel sounds. No hepatosplenomegaly.  MUSCULOSKELETAL: No swelling, clubbing, or edema.  NEUROLOGIC: GCS3T SKIN:intact,warm,dry   PERTINENT DATA     Infusions: . sodium chloride 10 mL/hr at 06/10/20 1700  . sodium chloride    . sodium chloride 10 mL/hr at 06/12/20 1104  . feeding supplement (VITAL AF 1.2 CAL) 1,000 mL (06/13/20 0244)  . fentaNYL infusion INTRAVENOUS 350 mcg/hr (06/13/20 0731)  . heparin 1,000 Units/hr (06/13/20 0335)  . norepinephrine (LEVOPHED) Adult infusion 5 mcg/min (06/12/20 1104)  . propofol (DIPRIVAN) infusion 50 mcg/kg/min (06/13/20 0604)   Scheduled Medications: . amitriptyline  25 mg Per Tube QHS  . calcium-vitamin D  1 tablet Per Tube Q breakfast  . chlorhexidine gluconate (MEDLINE KIT)  15 mL Mouth Rinse BID  . Chlorhexidine Gluconate Cloth  6 each Topical Daily  . diltiazem  30 mg Per Tube Q6H  . docusate  100 mg Per Tube BID  . gabapentin  300 mg Per Tube TID  . insulin aspart  0-9 Units Subcutaneous Q4H  . ipratropium-albuterol  3 mL Nebulization Q6H  . mouth rinse  15 mL Mouth Rinse 10 times per day  . methylPREDNISolone (SOLU-MEDROL) injection  40 mg Intravenous Q24H  . metoprolol tartrate  50 mg Per Tube BID  . midodrine  10 mg Per Tube TID WC  . pantoprazole (PROTONIX) IV  40 mg Intravenous QHS  . polyethylene glycol  17 g Per Tube Daily  . QUEtiapine  25 mg Per Tube QHS  . sodium chloride flush  10-40 mL Intracatheter Q12H  . sodium chloride flush  3 mL Intravenous Q12H   PRN Medications: sodium chloride, acetaminophen (TYLENOL) oral liquid 160 mg/5 mL, diazepam, ipratropium-albuterol, ondansetron (ZOFRAN) IV, polyethylene glycol, sodium chloride flush, sodium chloride flush Hemodynamic parameters:   Intake/Output: 12/20 0701 - 12/21 0700 In: 1223.5 [I.V.:1223.5] Out: 2000 [Urine:2000]  Ventilator  Settings: Vent Mode: PRVC FiO2 (%):  [30 %] 30 %  Set Rate:   [14 bmp] 14 bmp Vt Set:  [420 mL] 420 mL PEEP:  [5 cmH20] 5 cmH20 Plateau Pressure:  [16 cmH20] 16 cmH20   LAB RESULTS:  Basic Metabolic Panel: Recent Labs  Lab 06/09/20 0643 06/10/20 0422 06/11/20 0455 06/12/20 0442 06/13/20 0606  NA 140 143 142 138 140  K 4.4 4.9 4.4 4.6 4.6  CL 104 105 103 100 103  CO2 29 32 29 31 32  GLUCOSE 137* 123* 167* 183* 114*  BUN 38* 34* 35* 36* 40*  CREATININE 0.97 0.97 1.02* 0.93 0.92  CALCIUM 8.3* 9.0 9.4 8.9 8.9  MG 2.2 2.2 2.0 2.2 2.1  PHOS 3.8 4.7* 3.0 4.0 4.1   Liver Function Tests: Recent Labs  Lab 06/06/20 1207 06/10/20 0422  AST 48* 23  ALT 46* 25  ALKPHOS 17* 13*  BILITOT 0.6 0.5  PROT 6.9 5.5*  ALBUMIN 3.9 3.0*   No results for input(s): LIPASE, AMYLASE in the last 168 hours. No results for input(s): AMMONIA in the last 168 hours. CBC: Recent Labs  Lab 06/06/20 1207 06/07/20 0624 06/09/20 0643 06/10/20 0422 06/11/20 0455 06/12/20 0442 06/13/20 0820  WBC 13.9*   < > 8.1 12.8* 11.5* 13.3* 22.1*  NEUTROABS 12.8*  --   --   --   --   --   --   HGB 10.7*   < > 7.9* 8.5* 8.2* 8.4* 8.4*  HCT 35.0*   < > 24.7* 29.0* 26.8* 27.0* 28.6*  MCV 99.4   < > 98.4 103.2* 100.0 98.9 104.0*  PLT 304   < > 222 278 298 348 313   < > = values in this interval not displayed.   Cardiac Enzymes: No results for input(s): CKTOTAL, CKMB, CKMBINDEX, TROPONINI in the last 168 hours. BNP: Invalid input(s): POCBNP CBG: Recent Labs  Lab 06/12/20 1625 06/12/20 1933 06/12/20 2352 06/13/20 0351 06/13/20 0745  GLUCAP 149* 112* 137* 113* 111*       IMAGING RESULTS:  Imaging: DG Abd 1 View  Result Date: 06/12/2020 CLINICAL DATA:  OG tube placement. EXAM: ABDOMEN - 1 VIEW COMPARISON:  06/10/2020 FINDINGS: The enteric tube has been advanced and its tip and side hole now project over the gastric body. No dilated loops of bowel are seen to suggest obstruction. The visualized lung bases are clear. No acute osseous abnormality is seen.  IMPRESSION: Advancement of enteric tube as above. Electronically Signed   By: Logan Bores M.D.   On: 06/12/2020 10:53   '@PROBHOSP' @ DG Abd 1 View  Result Date: 06/12/2020 CLINICAL DATA:  OG tube placement. EXAM: ABDOMEN - 1 VIEW COMPARISON:  06/10/2020 FINDINGS: The enteric tube has been advanced and its tip and side hole now project over the gastric body. No dilated loops of bowel are seen to suggest obstruction. The visualized lung bases are clear. No acute osseous abnormality is seen. IMPRESSION: Advancement of enteric tube as above. Electronically Signed   By: Logan Bores M.D.   On: 06/12/2020 10:53     ASSESSMENT AND PLAN    -Multidisciplinary rounds held today  Acute Hypoxic Respiratory Failure -FiO2 is at 30% today -AECOPD  -continue Full MV support -continue Bronchodilator Therapy -Wean Fio2 and PEEP as tolerated -will perform SAT/SBT when respiratory parameters are met -s/p KUB - OGT advanced as indicated by radiologist   Acute on chronic diastolic CHF  - patient is on levophed and metoprolol - will get rid of levophed and use OGT midodrine for vasopressor support  GI/Nutrition GI PROPHYLAXIS as indicated DIET-->TF's as tolerated Constipation protocol as indicated  ENDO - ICU hypoglycemic\Hyperglycemia protocol -check FSBS per protocol   ELECTROLYTES -follow labs as needed -replace as needed -pharmacy consultation   DVT/GI PRX ordered -SCDs  TRANSFUSIONS AS NEEDED MONITOR FSBS ASSESS the need for LABS as needed   Critical care provider statement:    Critical care time (minutes):  109   Critical care time was exclusive of:  Separately billable procedures and treating other patients   Critical care was necessary to treat or prevent imminent or life-threatening deterioration of the following conditions:  acute hypoxemic respiratory failure, AECOPD, multiple comorbid conditions   Critical care was time spent personally by me on the following activities:   Development of treatment plan with patient or surrogate, discussions with consultants, evaluation of patient's response to treatment, examination of patient, obtaining history from patient or surrogate, ordering and performing treatments and interventions, ordering and review of laboratory studies and re-evaluation of patient's condition.  I assumed direction of critical care for this patient from another provider in my specialty: no    This document was prepared using Dragon voice recognition software and may include unintentional dictation errors.    Ottie Glazier, M.D.  Division of [place]

## 2020-06-13 NOTE — Consult Note (Signed)
PHARMACY CONSULT NOTE - FOLLOW UP  Pharmacy Consult for Electrolyte Monitoring and Replacement   Recent Labs: Potassium (mmol/L)  Date Value  06/13/2020 4.6   Magnesium (mg/dL)  Date Value  14/97/0263 2.1   Calcium (mg/dL)  Date Value  78/58/8502 8.9   Albumin (g/dL)  Date Value  77/41/2878 3.0 (L)   Phosphorus (mg/dL)  Date Value  67/67/2094 4.1   Sodium (mmol/L)  Date Value  06/13/2020 140     Assessment: 69 yo female  admitted with acute on chronic hypoxic hypercapnic respiratory failure and acute encephalopathy secondary to narcotic use,  AECOPD requiring mechanical intubation, and acute kidney injury/failure.    Goal of Therapy:  Electrolytes WNL  Plan:   No replacement needed.   Continue to follow along  [place]ia [name] ,PharmD Clinical Pharmacist 06/13/2020 12:08 PM

## 2020-06-13 NOTE — Progress Notes (Signed)
ANTICOAGULATION CONSULT NOTE -  Pharmacy Consult for Heparin drip (was on apixaban-last dose12/19 @ 2247)  Indication: atrial fibrillation  No Known Allergies  Patient Measurements: Height: 5' 2.01" (157.5 cm) Weight: 62.6 kg (138 lb 0.1 oz) IBW/kg (Calculated) : 50.12 Heparin Dosing Weight: 62.6 kg   Vital Signs: Temp: 99.1 F (37.3 C) (12/21 0000) Temp Source: Esophageal (12/20 1600) BP: 98/70 (12/21 0000) Pulse Rate: 72 (12/21 0000)  Labs: Recent Labs    06/10/20 0422 06/10/20 1146 06/10/20 1352 06/11/20 0455 06/12/20 0442 06/12/20 0951 06/12/20 1753 06/13/20 0102  HGB 8.5*  --   --  8.2* 8.4*  --   --   --   HCT 29.0*  --   --  26.8* 27.0*  --   --   --   PLT 278  --   --  298 348  --   --   --   APTT  --   --   --   --   --  <24* 66* 134*  HEPARINUNFRC  --   --   --   --   --  >3.60*  --   --   CREATININE 0.97  --   --  1.02* 0.93  --   --   --   TROPONINIHS  --  142* 159*  --   --   --   --   --     Estimated Creatinine Clearance: 49.7 mL/min (by C-G formula based on SCr of 0.93 mg/dL).  Medical History: Past Medical History:  Diagnosis Date  . Anemia   . Anxiety   . Arthritis   . Atrial fibrillation (HCC)   . C. difficile colitis 09/2015  . COPD (chronic obstructive pulmonary disease) (HCC)   . Depression   . Dyspnea   . Dysrhythmia   . Fibromyalgia   . H/O tracheostomy   . Hep C w/ coma, chronic   . Hypertension   . MRSA pneumonia (HCC) 2017  . On home oxygen therapy    3 L/M   . Osteoporosis   . Peripheral neuropathy   . RLS (restless legs syndrome)   . S/P percutaneous endoscopic gastrostomy (PEG) tube placement (HCC) 09/2015  . Ventilator associated pneumonia [facility]) 10/2015   [facility], [place]    Medications:  Medications Prior to Admission  Medication Sig Dispense Refill Last Dose  . albuterol (VENTOLIN HFA) 108 (90 Base) MCG/ACT inhaler Inhale 2 puffs into the lungs every 4 (four) hours as needed for wheezing or shortness  of breath.   Unknown at PRN  . amitriptyline (ELAVIL) 25 MG tablet Take 25 mg by mouth at bedtime.    18-24 hours at Unknown  . diazepam (VALIUM) 5 MG tablet Take 5 mg by mouth every 8 (eight) hours as needed for anxiety.   5 Unknown at PRN  . diltiazem (CARDIZEM CD) 120 MG 24 hr capsule Take 120 mg by mouth daily.   12-36 hours at Unknown  . estradiol (ESTRACE) 1 MG tablet Take 1 mg by mouth daily.  1 12-36 hours at Unknown  . furosemide (LASIX) 20 MG tablet Take 20 mg by mouth daily.   1 12-36 hours at Unknown  . gabapentin (NEURONTIN) 300 MG capsule Take 300 mg by mouth 3 (three) times daily.   12-36 hours at Unknown  . lisinopril (ZESTRIL) 10 MG tablet Take 10 mg by mouth daily.   12-36 hours at Unknown  . MAGNESIUM-OXIDE 400 (241.3 Mg) MG tablet Take 400  mg by mouth daily.   1   . metoprolol tartrate (LOPRESSOR) 50 MG tablet Take 50 mg by mouth 2 (two) times daily.   12-36 hours at Unknown  . Multiple Vitamin (MULTIVITAMIN) tablet Take 1 tablet by mouth daily.     Marland Kitchen omeprazole (PRILOSEC) 40 MG capsule Take 40 mg by mouth daily.   12-36 hours at Unknown  . oxyCODONE-acetaminophen (PERCOCET) 10-325 MG tablet Take 1 tablet by mouth 3 (three) times daily.   12-36 hours at Unknown  . predniSONE (DELTASONE) 5 MG tablet Take 5 mg by mouth daily.   12-36 hours at Unknown  . QUEtiapine (SEROQUEL) 25 MG tablet Take 25 mg by mouth at bedtime.   1 24+ hours at Unknown  . TRELEGY ELLIPTA 100-62.5-25 MCG/INH AEPB Inhale 1 puff into the lungs daily.   12-36 hours at Unknown  . apixaban (ELIQUIS) 5 MG TABS tablet Take 1 tablet (5 mg total) by mouth 2 (two) times daily. 60 tablet 0 12-36 hours at Unknown  . levofloxacin (LEVAQUIN) 500 MG tablet Take 1 tablet (500 mg total) by mouth daily for 10 days. 5 tablet 0 12-36 hours at Unknown  . predniSONE (DELTASONE) 10 MG tablet Take 4 tablets (40 mg total) by mouth daily. 20 tablet 0 12-36 hours at Unknown    Assessment: Pharmacy consulted to dose heparin for  AFib in this 69 year old female , was on Eliquis 5 mg PO BID prior but d/c'd due to upcoming tracheostomy procedure.   Will cover with Heparin until tracheostomy is done.  Last dose of Eliquis was on 12/19 @ 2247.  CrCl = 49.7 ml/min   Goal of Therapy:  HL:  0.3 - 0.7 APTT = 66 - 102  Monitor platelets by anticoagulation protocol: Yes   Plan:  Will draw baseline aPTT and HL 1 hr prior to start of heparin gtt.  Will start heparin gtt at 1000 units/hr on 12/20 @ ~ 1100.  No bolus. Will use aPTT to guide dosing until HL and aPTT are both therapeutic.    12/20 @ 1753 aPTT= 66. Patient is borderline therapeutic. Will slightly increase drip rate to 1050 units/hr. F/u aptt in 6 hours.  12/21 @ 0102 aPTT = 134, SUPRAtherapeutic.  Will reduce Heparin back to 1000 units/hr as pt was therapeutic on low end of goal range.  Recheck aPTT in 6 hours    Margo Aye, Laketa Sandoz A 06/13/2020,1:57 AM

## 2020-06-13 NOTE — Progress Notes (Signed)
ANTICOAGULATION CONSULT NOTE  Pharmacy Consult for Heparin drip (was on apixaban-last dose 12/19 @ 2247)  Indication: atrial fibrillation  Patient Measurements: Heparin Dosing Weight: 62.6 kg   Labs: Recent Labs    06/11/20 0455 06/12/20 0442 06/12/20 0951 06/12/20 1753 06/13/20 0102 06/13/20 0606 06/13/20 0820 06/13/20 1817  HGB 8.2* 8.4*  --   --   --   --  8.4*  --   HCT 26.8* 27.0*  --   --   --   --  28.6*  --   PLT 298 348  --   --   --   --  313  --   APTT  --   --  <24*   < > 134*  --  131* 103*  HEPARINUNFRC  --   --  >3.60*  --   --  2.76*  --   --   CREATININE 1.02* 0.93  --   --   --  0.92  --   --    < > = values in this interval not displayed.    Estimated Creatinine Clearance: 50.2 mL/min (by C-G formula based on SCr of 0.92 mg/dL).  Medical History: Past Medical History:  Diagnosis Date  . Anemia   . Anxiety   . Arthritis   . Atrial fibrillation (HCC)   . C. difficile colitis 09/2015  . COPD (chronic obstructive pulmonary disease) (HCC)   . Depression   . Dyspnea   . Dysrhythmia   . Fibromyalgia   . H/O tracheostomy   . Hep C w/ coma, chronic   . Hypertension   . MRSA pneumonia (HCC) 2017  . On home oxygen therapy    3 L/M   . Osteoporosis   . Peripheral neuropathy   . RLS (restless legs syndrome)   . S/P percutaneous endoscopic gastrostomy (PEG) tube placement (HCC) 09/2015  . Ventilator associated pneumonia [facility]) 10/2015   [facility], [place]    Assessment: Pharmacy consulted to dose heparin for AFib in this 69 year old female , was on Eliquis 5 mg PO BID prior but d/c'd due to upcoming tracheostomy procedure.   Will cover with Heparin until tracheostomy is done.   Last dose of Eliquis was on 12/19 @ 2247.   Goal of Therapy:  HL:  0.3 - 0.7 APTT = 66 - 102  Monitor platelets by anticoagulation protocol: Yes   Plan:  --12/21 at 1817 aPTT = 103s, slightly supratherapeutic. Will decrease heparin infusion rate to 800  units/hr --Re-check aPTT and HL 6 hours after rate change --Daily CBC per protocol while on heparin infusion   Tressie Ellis 06/13/2020,6:48 PM

## 2020-06-14 ENCOUNTER — Inpatient Hospital Stay: Payer: Medicare Other

## 2020-06-14 LAB — BASIC METABOLIC PANEL
Anion gap: 5 (ref 5–15)
BUN: 43 mg/dL — ABNORMAL HIGH (ref 8–23)
CO2: 33 mmol/L — ABNORMAL HIGH (ref 22–32)
Calcium: 8.8 mg/dL — ABNORMAL LOW (ref 8.9–10.3)
Chloride: 103 mmol/L (ref 98–111)
Creatinine, Ser: 0.84 mg/dL (ref 0.44–1.00)
GFR, Estimated: >60 mL/min (ref >60)
Glucose, Bld: 113 mg/dL — ABNORMAL HIGH (ref 70–99)
Potassium: 5 mmol/L (ref 3.5–5.1)
Sodium: 141 mmol/L (ref 135–145)

## 2020-06-14 LAB — MAGNESIUM: Magnesium: 2.2 mg/dL (ref 1.7–2.4)

## 2020-06-14 LAB — HEPARIN LEVEL (UNFRACTIONATED): Heparin Unfractionated: 1.84 IU/mL — ABNORMAL HIGH (ref 0.30–0.70)

## 2020-06-14 LAB — CBC
HCT: 28.5 % — ABNORMAL LOW (ref 36.0–46.0)
Hemoglobin: 8.7 g/dL — ABNORMAL LOW (ref 12.0–15.0)
MCH: 31.4 pg (ref 26.0–34.0)
MCHC: 30.5 g/dL (ref 30.0–36.0)
MCV: 102.9 fL — ABNORMAL HIGH (ref 80.0–100.0)
Platelets: 319 10*3/uL (ref 150–400)
RBC: 2.77 MIL/uL — ABNORMAL LOW (ref 3.87–5.11)
RDW: 14.6 % (ref 11.5–15.5)
WBC: 23.3 10*3/uL — ABNORMAL HIGH (ref 4.0–10.5)
nRBC: 0.5 % — ABNORMAL HIGH (ref 0.0–0.2)

## 2020-06-14 LAB — GLUCOSE, CAPILLARY
Glucose-Capillary: 97 mg/dL (ref 70–99)
Glucose-Capillary: 107 mg/dL — ABNORMAL HIGH (ref 70–99)
Glucose-Capillary: 134 mg/dL — ABNORMAL HIGH (ref 70–99)
Glucose-Capillary: 181 mg/dL — ABNORMAL HIGH (ref 70–99)
Glucose-Capillary: 128 mg/dL — ABNORMAL HIGH (ref 70–99)
Glucose-Capillary: 122 mg/dL — ABNORMAL HIGH (ref 70–99)

## 2020-06-14 LAB — PHOSPHORUS: Phosphorus: 4.1 mg/dL (ref 2.5–4.6)

## 2020-06-14 LAB — APTT
aPTT: 101 seconds — ABNORMAL HIGH (ref 24–36)
aPTT: 62 seconds — ABNORMAL HIGH (ref 24–36)
aPTT: 61 seconds — ABNORMAL HIGH (ref 24–36)

## 2020-06-14 NOTE — Progress Notes (Signed)
CRITICAL CARE PROGRESS NOTE    Name: Virginia [name] MRN: [id number] DOB: Aug 22, 1950     LOS: 24   SUBJECTIVE FINDINGS & SIGNIFICANT EVENTS    Patient description:  69 yo female with severe COPD admitted with acute on chronic hypoxic hypercapnic respiratory failure and acute encephalopathy secondary to narcotic use and AECOPD requiring mechanical intubation  12/14 admitted to ICU for COPD exacerbation 12/15 plan for SAT/SBT 12/16 failed SAT/SBT, will try again today 12/16 s/p extubation to BiPAP 12/17 resp status stilltenuous 12/18 severe resp failure high risk for intubation 12/18 Re intubated for severe COPD 12/19 severe resp  Failure, ENT consulted for trach   06/12/20- sedated to RASS-2.  Plan for trache as per previous discussion with critical care team and family. DCd amio gtt today . Weaning solumedrol to 40 daily from BID. Patient on Levophed and metoprolol we will remove arrythmogenic vasopressor today.   12/21- Trache postponed to 12/23 due to anticoagulation. Patient weaned down to 21% will attempt SBT again today- patient failed with severe hypercapnia, plan continues to be for trache. Family updated today. Poke with Judeen Hammans DIL.  12/22- no events overnight. Plan for trache in am.    Lines/tubes : Airway 7.5 mm (Active)  Secured at (cm) 22 cm 06/12/20 0807  Measured From Lips 06/12/20 0807  Secured Location Left 06/12/20 0807  Secured By [employer] 06/12/20 0807  Tube Holder Repositioned Yes 06/12/20 0807  Prone position No 06/12/20 0807  Cuff Pressure (cm H2O) 26 cm H2O 06/12/20 0807  Site Condition Dry;Cool 06/12/20 0807     CVC Triple Lumen 06/10/20 Left Internal jugular (Active)  Indication for Insertion or Continuance of Line Chronic illness with exacerbations (CF,  Sickle Cell, etc.) 06/11/20 2100  Site Assessment Clean;Dry;Intact 06/11/20 2100  Proximal Lumen Status Infusing;Flushed;Blood return noted 06/11/20 2100  Medial Lumen Status Infusing 06/11/20 2100  Distal Lumen Status Infusing;Flushed;Blood return noted 06/11/20 2100  Dressing Type Transparent;Occlusive 06/11/20 2100  Dressing Status Clean;Dry;Intact 06/11/20 2100  Antimicrobial disc in place? Yes 06/11/20 2100  Line Care Connections checked and tightened 06/11/20 2100  Dressing Intervention New dressing 06/10/20 2100  Dressing Change Due 06/17/20 06/11/20 2100     NG/OG Tube Orogastric Center mouth Xray (Active)  Cm Marking at Nare/Corner of Mouth (if applicable) 61 cm 95/09/32 2000  Site Assessment Clean;Dry;Intact 06/11/20 2000  Ongoing Placement Verification No change in cm markings or external length of tube from initial placement;No change in respiratory status;No acute changes, not attributed to clinical condition 06/11/20 2000  Status Infusing tube feed 06/11/20 2000  Drainage Appearance None 06/11/20 1600  Intake (mL) 30 mL 06/11/20 1600     Urethral Catheter Skeet Latch, RN Double-lumen;Latex 14 Fr. (Active)  Indication for Insertion or Continuance of Catheter Therapy based on hourly urine output monitoring and documentation for critical condition (NOT STRICT I&O) 06/12/20 0800  Site Assessment Clean;Intact 06/12/20 0800  Catheter Maintenance Bag below level of bladder;Catheter secured;Drainage bag/tubing not touching floor;Insertion date on drainage bag;No dependent loops;Seal intact 06/12/20 0800  Collection Container Standard drainage bag 06/12/20 0800  Securement Method Securing device (Describe) 06/12/20 0800  Urinary Catheter Interventions (if applicable) Unclamped 67/12/45 0800  Output (mL) 60 mL 06/11/20 1800    Microbiology/Sepsis markers: Results for orders placed or performed during the hospital encounter of 06/06/20  Blood culture (routine x 2)     Status:  None   Collection Time: 06/06/20 12:07 PM   Specimen: BLOOD  Result Value Ref Range Status  Specimen Description BLOOD RIGHT [facility]  Final   Special Requests   Final    BOTTLES DRAWN AEROBIC AND ANAEROBIC Blood Culture adequate volume   Culture   Final    NO GROWTH 5 DAYS Performed at [facility], [address] [place], [place] [address]    Report Status 06/11/2020 FINAL  Final  Blood culture (routine x 2)     Status: None   Collection Time: 06/06/20 12:07 PM   Specimen: BLOOD  Result Value Ref Range Status   Specimen Description BLOOD LEFT AC  Final   Special Requests   Final    BOTTLES DRAWN AEROBIC AND ANAEROBIC Blood Culture adequate volume   Culture   Final    NO GROWTH 5 DAYS Performed at [facility], [address] [place], [place] [address]    Report Status 06/11/2020 FINAL  Final  Resp Panel by RT-PCR (Flu A&B, Covid) Nasopharyngeal Swab     Status: None   Collection Time: 06/06/20 12:07 PM   Specimen: Nasopharyngeal Swab; Nasopharyngeal(NP) swabs in vial transport medium  Result Value Ref Range Status   SARS Coronavirus 2 by RT PCR NEGATIVE NEGATIVE Final    Comment: (NOTE) SARS-CoV-2 target nucleic acids are NOT DETECTED.  The SARS-CoV-2 RNA is generally detectable in upper respiratory specimens during the acute phase of infection. The lowest concentration of SARS-CoV-2 viral copies this assay can detect is 138 copies/mL. A negative result does not preclude SARS-Cov-2 infection and should not be used as the sole basis for treatment or other patient management decisions. A negative result may occur with  improper specimen collection/handling, submission of specimen other than nasopharyngeal swab, presence of viral mutation(s) within the areas targeted by this assay, and inadequate number of viral copies(<138 copies/mL). A negative result must be combined with clinical observations, patient history, and epidemiological information. The  expected result is Negative.  Fact Sheet for Patients:  EntrepreneurPulse.com.au  Fact Sheet for Healthcare Providers:  IncredibleEmployment.be  This test is no t yet approved or cleared by the [place] FDA and  has been authorized for detection and/or diagnosis of SARS-CoV-2 by FDA under an Emergency Use Authorization (EUA). This EUA will remain  in effect (meaning this test can be used) for the duration of the COVID-19 declaration under Section 564(b)(1) of the Act, 21 U.S.C.section 360bbb-3(b)(1), unless the authorization is terminated  or revoked sooner.       Influenza A by PCR NEGATIVE NEGATIVE Final   Influenza B by PCR NEGATIVE NEGATIVE Final    Comment: (NOTE) The Xpert Xpress SARS-CoV-2/FLU/RSV plus assay is intended as an aid in the diagnosis of influenza from Nasopharyngeal swab specimens and should not be used as a sole basis for treatment. Nasal washings and aspirates are unacceptable for Xpert Xpress SARS-CoV-2/FLU/RSV testing.  Fact Sheet for Patients: EntrepreneurPulse.com.au  Fact Sheet for Healthcare Providers: IncredibleEmployment.be  This test is not yet approved or cleared by the [place] FDA and has been authorized for detection and/or diagnosis of SARS-CoV-2 by FDA under an Emergency Use Authorization (EUA). This EUA will remain in effect (meaning this test can be used) for the duration of the COVID-19 declaration under Section 564(b)(1) of the Act, 21 U.S.C. section 360bbb-3(b)(1), unless the authorization is terminated or revoked.  Performed at [facility], [address] [place], [place] [address]   MRSA PCR Screening     Status: None   Collection Time: 06/06/20 11:00 PM   Specimen: Nasopharyngeal  Result Value Ref  Range Status   MRSA by PCR NEGATIVE NEGATIVE Final    Comment:        The GeneXpert MRSA Assay (FDA approved for NASAL specimens only),  is one component of a comprehensive MRSA colonization surveillance program. It is not intended to diagnose MRSA infection nor to guide or monitor treatment for MRSA infections. Performed at [facility], [address] [place], [place] [address]     Anti-infectives:  Anti-infectives (From admission, onward)   Start     Dose/Rate Route Frequency Ordered Stop   06/07/20 1400  azithromycin (ZITHROMAX) 500 mg in sodium chloride 0.9 % 250 mL IVPB  Status:  Discontinued        500 mg 250 mL/hr over 60 Minutes Intravenous Every 24 hours 06/06/20 2030 06/06/20 2044   06/07/20 1400  azithromycin (ZITHROMAX) 500 mg in sodium chloride 0.9 % 250 mL IVPB  Status:  Discontinued        500 mg 250 mL/hr over 60 Minutes Intravenous Every 24 hours 06/06/20 2044 06/08/20 1031   06/06/20 1415  cefTRIAXone (ROCEPHIN) 2 g in sodium chloride 0.9 % 100 mL IVPB        2 g 200 mL/hr over 30 Minutes Intravenous  Once 06/06/20 1407 06/06/20 1514   06/06/20 1415  azithromycin (ZITHROMAX) 500 mg in sodium chloride 0.9 % 250 mL IVPB        500 mg 250 mL/hr over 60 Minutes Intravenous  Once 06/06/20 1407 06/06/20 1708       Consults:   ENT - tracheostomy  PAST MEDICAL HISTORY   Past Medical History:  Diagnosis Date  . Anemia   . Anxiety   . Arthritis   . Atrial fibrillation ([place])   . C. difficile colitis 09/2015  . COPD (chronic obstructive pulmonary disease) ([place])   . Depression   . Dyspnea   . Dysrhythmia   . Fibromyalgia   . H/O tracheostomy   . Hep C w/ coma, chronic   . Hypertension   . MRSA pneumonia ([place]) 2017  . On home oxygen therapy    3 L/M   . Osteoporosis   . Peripheral neuropathy   . RLS (restless legs syndrome)   . S/P percutaneous endoscopic gastrostomy (PEG) tube placement ([place]) 09/2015  . Ventilator associated pneumonia [facility]) 10/2015   [facility], [place]     SURGICAL HISTORY   Past Surgical History:  Procedure Laterality Date  . ABDOMINAL  HYSTERECTOMY    . BREAST SURGERY Bilateral    Breast Implants  . DILATION AND CURETTAGE OF UTERUS    . REVERSE SHOULDER ARTHROPLASTY Right 03/06/2017   Procedure: REVERSE SHOULDER ARTHROPLASTY;  Surgeon: Corky Mull, MD;  Location: [facility] ORS;  Service: Orthopedics;  Laterality: Right;  . ROTATOR CUFF REPAIR Bilateral      FAMILY HISTORY   Family History  Problem Relation Age of Onset  . Hypertension Mother      SOCIAL HISTORY   Social History   Tobacco Use  . Smoking status: Former Smoker    Packs/day: 1.50    Types: Cigarettes    Quit date: 10/05/2015    Years since quitting: 4.6  . Smokeless tobacco: Never Used  Vaping Use  . Vaping Use: Some days  . Last attempt to quit: 10/05/2015  Substance Use Topics  . Alcohol use: No  . Drug use: No     MEDICATIONS   Current Medication:  Current Facility-Administered Medications:  .  0.9 %  sodium chloride  infusion, 250 mL, Intravenous, Continuous, Duffy Bruce, MD, Last Rate: 10 mL/hr at 06/10/20 1700, Infusion Verify at 06/10/20 1700 .  0.9 %  sodium chloride infusion, 250 mL, Intravenous, PRN, Mortimer Fries, Kurian, MD .  0.9 %  sodium chloride infusion, 250 mL, Intravenous, Continuous, Kasa, Kurian, MD, Stopped at 06/12/20 1131 .  acetaminophen (TYLENOL) 160 MG/5ML solution 650 mg, 650 mg, Per Tube, Q4H PRN, Flora Lipps, MD .  amitriptyline (ELAVIL) tablet 25 mg, 25 mg, Per Tube, QHS, Flora Lipps, MD, 25 mg at 06/13/20 2222 .  calcium-vitamin D (OSCAL WITH D) 500-200 MG-UNIT per tablet 1 tablet, 1 tablet, Per Tube, Q breakfast, Flora Lipps, MD, 1 tablet at 06/14/20 0842 .  chlorhexidine gluconate (MEDLINE KIT) (PERIDEX) 0.12 % solution 15 mL, 15 mL, Mouth Rinse, BID, Kasa, Kurian, MD, 15 mL at 06/14/20 0845 .  Chlorhexidine Gluconate Cloth 2 % PADS 6 each, 6 each, Topical, Daily, Flora Lipps, MD, 6 each at 06/13/20 1014 .  diazepam (VALIUM) tablet 5 mg, 5 mg, Per Tube, Q8H PRN, Rust-Chester, Britton L, NP, 5 mg at 06/11/20  0523 .  diltiazem (CARDIZEM) tablet 30 mg, 30 mg, Per Tube, Q6H, BelueAlver Sorrow, RPH, 30 mg at 06/14/20 0506 .  docusate (COLACE) 50 MG/5ML liquid 100 mg, 100 mg, Per Tube, BID, Renda Rolls, RPH, 100 mg at 06/13/20 2222 .  feeding supplement (VITAL AF 1.2 CAL) liquid 1,000 mL, 1,000 mL, Per Tube, Continuous, Bettyann Birchler, MD, Last Rate: 50 mL/hr at 06/14/20 0240, 1,000 mL at 06/14/20 0240 .  fentaNYL 2539mg in NS 2549m(1074mml) infusion-PREMIX, 0-400 mcg/hr, Intravenous, Continuous, Kasa, Kurian, MD, Last Rate: 25 mL/hr at 06/14/20 0942, 250 mcg/hr at 06/14/20 0942 .  gabapentin (NEURONTIN) capsule 300 mg, 300 mg, Per Tube, TID, KasMortimer Friesurian, MD, 300 mg at 06/14/20 0935 .  heparin ADULT infusion 100 units/mL (25000 units/250m4mdium chloride 0.45%), 800 Units/hr, Intravenous, Continuous, ChapBenita GutterH, Last Rate: 8 mL/hr at 06/14/20 0942, 800 Units/hr at 06/14/20 0942 .  insulin aspart (novoLOG) injection 0-9 Units, 0-9 Units, Subcutaneous, Q4H, Rust-Chester, Britton L, NP, 1 Units at 06/13/20 2320 .  ipratropium-albuterol (DUONEB) 0.5-2.5 (3) MG/3ML nebulizer solution 3 mL, 3 mL, Nebulization, Q6H, Blakeney, Dana G, NP, 3 mL at 06/14/20 0845 .  ipratropium-albuterol (DUONEB) 0.5-2.5 (3) MG/3ML nebulizer solution 3 mL, 3 mL, Nebulization, Q6H PRN, BlakAwilda Bill .  MEDLINE mouth rinse, 15 mL, Mouth Rinse, 10 times per day, KasaFlora Lipps, 15 mL at 06/14/20 0937 .  methylPREDNISolone sodium succinate (SOLU-MEDROL) 40 mg/mL injection 40 mg, 40 mg, Intravenous, Q24H, AlesLanney Ginsad, MD, 40 mg at 06/14/20 0934 .  metoprolol tartrate (LOPRESSOR) tablet 50 mg, 50 mg, Per Tube, BID, KasaFlora Lipps, 50 mg at 06/13/20 2221 .  midodrine (PROAMATINE) tablet 10 mg, 10 mg, Per Tube, TID WC, Maybree Riling, MD, 10 mg at 06/14/20 0842 .  norepinephrine (LEVOPHED) 16 mg in 250mL63mmix infusion, 0-40 mcg/min, Intravenous, Titrated, Rust-Chester, Britton L, NP, Last Rate: 3.75 mL/hr at  06/14/20 0942, 4 mcg/min at 06/14/20 0942 .  ondansetron (ZOFRAN) injection 4 mg, 4 mg, Intravenous, Q6H PRN, Kasa,Mortimer Friesian, MD .  pantoprazole (PROTONIX) injection 40 mg, 40 mg, Intravenous, QHS, Kasa, Kurian, MD, 40 mg at 06/13/20 2221 .  polyethylene glycol (MIRALAX / GLYCOLAX) packet 17 g, 17 g, Per Tube, Daily PRN, Kasa,Mortimer Friesian, MD .  polyethylene glycol (MIRALAX / GLYCOLAX) packet 17 g, 17 g, Per Tube, Daily, Kasa, Kurian, MD, 17 g at  06/12/20 0919 .  propofol (DIPRIVAN) 1000 MG/100ML infusion, 5-80 mcg/kg/min, Intravenous, Titrated, Flora Lipps, MD, Stopped at 06/14/20 0803 .  QUEtiapine (SEROQUEL) tablet 25 mg, 25 mg, Per Tube, QHS, Flora Lipps, MD, 25 mg at 06/13/20 2221 .  sodium chloride flush (NS) 0.9 % injection 10-40 mL, 10-40 mL, Intracatheter, Q12H, Flora Lipps, MD, 10 mL at 06/14/20 0936 .  sodium chloride flush (NS) 0.9 % injection 10-40 mL, 10-40 mL, Intracatheter, PRN, Mortimer Fries, Kurian, MD .  sodium chloride flush (NS) 0.9 % injection 3 mL, 3 mL, Intravenous, Q12H, Kasa, Kurian, MD, 3 mL at 06/13/20 2222 .  sodium chloride flush (NS) 0.9 % injection 3 mL, 3 mL, Intravenous, PRN, Flora Lipps, MD    ALLERGIES   Patient has no known allergies.    REVIEW OF SYSTEMS    Unable to obtain on sedation and mechanical ventilation  PHYSICAL EXAMINATION   Vital Signs: Temp:  [97.52 F (36.4 C)-99.5 F (37.5 C)] 98.6 F (37 C) (12/22 0930) Pulse Rate:  [60-106] 101 (12/22 0930) Resp:  [9-28] 27 (12/22 0930) BP: (86-142)/(57-88) 128/79 (12/22 0930) SpO2:  [91 %-100 %] 93 % (12/22 0930) FiO2 (%):  [30 %] 30 % (12/22 0837) Weight:  [65.8 kg] 65.8 kg (12/22 0330)  GENERAL:age appropriate sedated on MV HEAD: Normocephalic, atraumatic.  EYES: Pupils equal, round, reactive to light.  No scleral icterus.  MOUTH: Moist mucosal membrane. NECK: Supple. No thyromegaly. No nodules. No JVD.  PULMONARY: mild rhonchi CARDIOVASCULAR: S1 and S2. Regular rate and rhythm. No murmurs,  rubs, or gallops.  GASTROINTESTINAL: Soft, nontender, non-distended. No masses. Positive bowel sounds. No hepatosplenomegaly.  MUSCULOSKELETAL: No swelling, clubbing, or edema.  NEUROLOGIC: GCS3T SKIN:intact,warm,dry   PERTINENT DATA     Infusions: . sodium chloride 10 mL/hr at 06/10/20 1700  . sodium chloride    . sodium chloride Stopped (06/12/20 1131)  . feeding supplement (VITAL AF 1.2 CAL) 1,000 mL (06/14/20 0240)  . fentaNYL infusion INTRAVENOUS 250 mcg/hr (06/14/20 0942)  . heparin 800 Units/hr (06/14/20 0942)  . norepinephrine (LEVOPHED) Adult infusion 4 mcg/min (06/14/20 0942)  . propofol (DIPRIVAN) infusion Stopped (06/14/20 0803)   Scheduled Medications: . amitriptyline  25 mg Per Tube QHS  . calcium-vitamin D  1 tablet Per Tube Q breakfast  . chlorhexidine gluconate (MEDLINE KIT)  15 mL Mouth Rinse BID  . Chlorhexidine Gluconate Cloth  6 each Topical Daily  . diltiazem  30 mg Per Tube Q6H  . docusate  100 mg Per Tube BID  . gabapentin  300 mg Per Tube TID  . insulin aspart  0-9 Units Subcutaneous Q4H  . ipratropium-albuterol  3 mL Nebulization Q6H  . mouth rinse  15 mL Mouth Rinse 10 times per day  . methylPREDNISolone (SOLU-MEDROL) injection  40 mg Intravenous Q24H  . metoprolol tartrate  50 mg Per Tube BID  . midodrine  10 mg Per Tube TID WC  . pantoprazole (PROTONIX) IV  40 mg Intravenous QHS  . polyethylene glycol  17 g Per Tube Daily  . QUEtiapine  25 mg Per Tube QHS  . sodium chloride flush  10-40 mL Intracatheter Q12H  . sodium chloride flush  3 mL Intravenous Q12H   PRN Medications: sodium chloride, acetaminophen (TYLENOL) oral liquid 160 mg/5 mL, diazepam, ipratropium-albuterol, ondansetron (ZOFRAN) IV, polyethylene glycol, sodium chloride flush, sodium chloride flush Hemodynamic parameters:   Intake/Output: 12/21 0701 - 12/22 0700 In: 1090 [I.V.:540; NG/GT:550] Out: 1975 [Urine:1875; Stool:100]  Ventilator  Settings: Vent Mode: PRVC FiO2 (%):   [  30 %] 30 % Set Rate:  [14 bmp] 14 bmp Vt Set:  [420 mL] 420 mL PEEP:  [5 cmH20] 5 cmH20   LAB RESULTS:  Basic Metabolic Panel: Recent Labs  Lab 06/10/20 0422 06/11/20 0455 06/12/20 0442 06/13/20 0606 06/14/20 0135  NA 143 142 138 140 141  K 4.9 4.4 4.6 4.6 5.0  CL 105 103 100 103 103  CO2 32 29 31 32 33*  GLUCOSE 123* 167* 183* 114* 113*  BUN 34* 35* 36* 40* 43*  CREATININE 0.97 1.02* 0.93 0.92 0.84  CALCIUM 9.0 9.4 8.9 8.9 8.8*  MG 2.2 2.0 2.2 2.1 2.2  PHOS 4.7* 3.0 4.0 4.1 4.1   Liver Function Tests: Recent Labs  Lab 06/10/20 0422  AST 23  ALT 25  ALKPHOS 13*  BILITOT 0.5  PROT 5.5*  ALBUMIN 3.0*   No results for input(s): LIPASE, AMYLASE in the last 168 hours. No results for input(s): AMMONIA in the last 168 hours. CBC: Recent Labs  Lab 06/10/20 0422 06/11/20 0455 06/12/20 0442 06/13/20 0820 06/14/20 0135  WBC 12.8* 11.5* 13.3* 22.1* 23.3*  HGB 8.5* 8.2* 8.4* 8.4* 8.7*  HCT 29.0* 26.8* 27.0* 28.6* 28.5*  MCV 103.2* 100.0 98.9 104.0* 102.9*  PLT 278 298 348 313 319   Cardiac Enzymes: No results for input(s): CKTOTAL, CKMB, CKMBINDEX, TROPONINI in the last 168 hours. BNP: Invalid input(s): POCBNP CBG: Recent Labs  Lab 06/13/20 1547 06/13/20 1942 06/13/20 2317 06/14/20 0329 06/14/20 0726  GLUCAP 165* 122* 149* 97 107*       IMAGING RESULTS:  Imaging: DG Abd 1 View  Result Date: 06/12/2020 CLINICAL DATA:  OG tube placement. EXAM: ABDOMEN - 1 VIEW COMPARISON:  06/10/2020 FINDINGS: The enteric tube has been advanced and its tip and side hole now project over the gastric body. No dilated loops of bowel are seen to suggest obstruction. The visualized lung bases are clear. No acute osseous abnormality is seen. IMPRESSION: Advancement of enteric tube as above. Electronically Signed   By: Logan Bores M.D.   On: 06/12/2020 10:53   DG Chest Port 1 View  Result Date: 06/14/2020 CLINICAL DATA:  Acute respiratory failure EXAM: PORTABLE CHEST 1  VIEW COMPARISON:  Radiograph 06/11/2020, CT 07/30/2017 FINDINGS: Endotracheal tube is in the mid trachea, proximally 3.5 cm from the carina. Transesophageal temperature probe projects in the region of the midthoracic esophagus. Transesophageal tube tip and side port distal to the GE junction, beyond the margins of imaging. Left internal jugular approach central venous catheter tip terminates in the left brachiocephalic vein near the superior caval confluence. Telemetry leads overlie the chest. Some chronic interstitial and bronchitic features compatible with COPD. Increased attenuation of the lung bases at least partially attributable to bilateral breast prostheses. No new airspace disease. No pneumothorax or effusion. Prior reverse right shoulder arthroplasty. No acute osseous or soft tissue abnormality. Degenerative changes are present in the imaged spine and shoulders. IMPRESSION: 1. Lines and tubes as described above. 2. Chronic interstitial and bronchitic features compatible with COPD. 3. No acute cardiopulmonary abnormality. 4. Bilateral breast prostheses, partially obscuring portions of the lung bases. Electronically Signed   By: Lovena Le M.D.   On: 06/14/2020 06:49   '@[email or web address]' @ DG Chest Port 1 View  Result Date: 06/14/2020 CLINICAL DATA:  Acute respiratory failure EXAM: PORTABLE CHEST 1 VIEW COMPARISON:  Radiograph 06/11/2020, CT 07/30/2017 FINDINGS: Endotracheal tube is in the mid trachea, proximally 3.5 cm from the carina. Transesophageal temperature probe projects in the region of  the midthoracic esophagus. Transesophageal tube tip and side port distal to the GE junction, beyond the margins of imaging. Left internal jugular approach central venous catheter tip terminates in the left brachiocephalic vein near the superior caval confluence. Telemetry leads overlie the chest. Some chronic interstitial and bronchitic features compatible with COPD. Increased attenuation of the lung bases at least  partially attributable to bilateral breast prostheses. No new airspace disease. No pneumothorax or effusion. Prior reverse right shoulder arthroplasty. No acute osseous or soft tissue abnormality. Degenerative changes are present in the imaged spine and shoulders. IMPRESSION: 1. Lines and tubes as described above. 2. Chronic interstitial and bronchitic features compatible with COPD. 3. No acute cardiopulmonary abnormality. 4. Bilateral breast prostheses, partially obscuring portions of the lung bases. Electronically Signed   By: Lovena Le M.D.   On: 06/14/2020 06:49     ASSESSMENT AND PLAN    -Multidisciplinary rounds held today  Acute Hypoxic Respiratory Failure -FiO2 is at 30% today -AECOPD  -continue Full MV support -continue Bronchodilator Therapy -Wean Fio2 and PEEP as tolerated -will perform SAT/SBT when respiratory parameters are met -s/p KUB - OGT advanced as indicated by radiologist   Acute on chronic diastolic CHF  - patient is on levophed and metoprolol - will get rid of levophed and use OGT midodrine for vasopressor support   GI/Nutrition GI PROPHYLAXIS as indicated DIET-->TF's as tolerated Constipation protocol as indicated  ENDO - ICU hypoglycemic\Hyperglycemia protocol -check FSBS per protocol   ELECTROLYTES -follow labs as needed -replace as needed -pharmacy consultation   DVT/GI PRX ordered -SCDs  TRANSFUSIONS AS NEEDED MONITOR FSBS ASSESS the need for LABS as needed   Critical care provider statement:    Critical care time (minutes):  33   Critical care time was exclusive of:  Separately billable procedures and treating other patients   Critical care was necessary to treat or prevent imminent or life-threatening deterioration of the following conditions:  acute hypoxemic respiratory failure, AECOPD, multiple comorbid conditions   Critical care was time spent personally by me on the following activities:  Development of treatment plan with patient  or surrogate, discussions with consultants, evaluation of patient's response to treatment, examination of patient, obtaining history from patient or surrogate, ordering and performing treatments and interventions, ordering and review of laboratory studies and re-evaluation of patient's condition.  I assumed direction of critical care for this patient from another provider in my specialty: no    This document was prepared using Dragon voice recognition software and may include unintentional dictation errors.    Ottie Glazier, M.D.  Division of [place]

## 2020-06-14 NOTE — Progress Notes (Signed)
ANTICOAGULATION CONSULT NOTE  Pharmacy Consult for Heparin drip (was on apixaban-last dose 12/19 @ 2247)  Indication: atrial fibrillation  Patient Measurements: Heparin Dosing Weight: 62.6 kg   Labs: Recent Labs    06/12/20 0442 06/12/20 0951 06/12/20 1753 06/13/20 0606 06/13/20 0820 06/13/20 1817 06/14/20 0135  HGB 8.4*  --   --   --  8.4*  --  8.7*  HCT 27.0*  --   --   --  28.6*  --  28.5*  PLT 348  --   --   --  313  --  319  APTT  --  <24*   < >  --  131* 103* 101*  HEPARINUNFRC  --  >3.60*  --  2.76*  --   --   --   CREATININE 0.93  --   --  0.92  --   --  0.84   < > = values in this interval not displayed.    Estimated Creatinine Clearance: 55 mL/min (by C-G formula based on SCr of 0.84 mg/dL).  Medical History: Past Medical History:  Diagnosis Date  . Anemia   . Anxiety   . Arthritis   . Atrial fibrillation (HCC)   . C. difficile colitis 09/2015  . COPD (chronic obstructive pulmonary disease) (HCC)   . Depression   . Dyspnea   . Dysrhythmia   . Fibromyalgia   . H/O tracheostomy   . Hep C w/ coma, chronic   . Hypertension   . MRSA pneumonia (HCC) 2017  . On home oxygen therapy    3 L/M   . Osteoporosis   . Peripheral neuropathy   . RLS (restless legs syndrome)   . S/P percutaneous endoscopic gastrostomy (PEG) tube placement (HCC) 09/2015  . Ventilator associated pneumonia [facility]) 10/2015   [facility], [place]   Assessment: Pharmacy consulted to dose heparin for AFib in this 69 year old female , was on Eliquis 5 mg PO BID prior but d/c'd due to upcoming tracheostomy procedure.   Will cover with Heparin until tracheostomy is done.   Last dose of Eliquis was on 12/19 @ 2247.   Goal of Therapy:  HL:  0.3 - 0.7 APTT = 66 - 102  Monitor platelets by anticoagulation protocol: Yes   Plan:  --12/22 at 0135 aPTT = 101s, therapeutic. Will continue heparin infusion rate at 800 units/hr --Re-check aPTT in 8 hours to confirm --Daily CBC per protocol  while on heparin infusion   Margo Aye, Briella Hobday A 06/14/2020,2:43 AM

## 2020-06-14 NOTE — Anesthesia Preprocedure Evaluation (Addendum)
Anesthesia Evaluation  Patient identified by MRN, date of birth, ID band Patient awake    Reviewed: Allergy & Precautions, H&P , NPO status , Patient's Chart, lab work & pertinent test results  History of Anesthesia Complications Negative for: history of anesthetic complications  Airway Mallampati: III  TM Distance: <3 FB Neck ROM: limited    Dental  (+) Poor Dentition, Chipped, Missing, Upper Dentures, Partial Lower   Pulmonary shortness of breath, with exertion and at rest, pneumonia, COPD,  COPD inhaler and oxygen dependent, former smoker,           Cardiovascular Exercise Tolerance: Poor hypertension, (-) angina+ PND  (-) Past MI + dysrhythmias Atrial Fibrillation   06/07/2020 ECHO IMPRESSIONS    1. Left ventricular ejection fraction, by estimation, is 65 to 70%. The  left ventricle has normal function. The left ventricle has no regional  wall motion abnormalities. Left ventricular diastolic function could not  be evaluated.  2. Right ventricular systolic function is normal. The right ventricular  size is normal.  3. The mitral valve is normal in structure. No evidence of mitral valve  regurgitation.  4. The aortic valve is normal in structure. Aortic valve regurgitation is  not visualized.    Neuro/Psych PSYCHIATRIC DISORDERS Anxiety Depression  Neuromuscular disease    GI/Hepatic negative GI ROS, (+) Hepatitis -, C  Endo/Other  negative endocrine ROS  Renal/GU      Musculoskeletal  (+) Arthritis , Fibromyalgia -  Abdominal   Peds  Hematology negative hematology ROS (+)   Anesthesia Other Findings Patient has cardiac and pulmonary clearance for this procedure.    Past Medical History: No date: Anemia No date: Anxiety No date: Arthritis No date: Atrial fibrillation (HCC) 09/2015: C. difficile colitis No date: COPD (chronic obstructive pulmonary disease) (HCC) No date: Depression No date:  Dyspnea No date: Dysrhythmia No date: Fibromyalgia No date: H/O tracheostomy No date: Hep C w/ coma, chronic (HCC) No date: Hypertension 2017: MRSA pneumonia (HCC) No date: On home oxygen therapy     Comment:  3 L/M  No date: Osteoporosis No date: Peripheral neuropathy No date: RLS (restless legs syndrome) 09/2015: S/P percutaneous endoscopic gastrostomy (PEG) tube placement  (HCC) 10/2015: Ventilator associated pneumonia (HCC)     Comment:  [facility], [place]  Past Surgical History: No date: ABDOMINAL HYSTERECTOMY No date: BREAST SURGERY; Bilateral     Comment:  Breast Implants No date: DILATION AND CURETTAGE OF UTERUS No date: ROTATOR CUFF REPAIR; Bilateral  BMI    Body Mass Index:  21.68 kg/m      Reproductive/Obstetrics negative OB ROS                            Anesthesia Physical  Anesthesia Plan  ASA: IV  Anesthesia Plan: General ETT   Post-op Pain Management:    Induction: Intravenous  PONV Risk Score and Plan: 3 and Ondansetron, Dexamethasone, Midazolam and Treatment may vary due to age or medical condition  Airway Management Planned: Oral ETT  Additional Equipment:   Intra-op Plan:   Post-operative Plan: Extubation in OR and Possible Post-op intubation/ventilation  Informed Consent: I have reviewed the patients History and Physical, chart, labs and discussed the procedure including the risks, benefits and alternatives for the proposed anesthesia with the patient or authorized representative who has indicated his/her understanding and acceptance.     Dental Advisory Given  Plan Discussed with: Anesthesiologist, CRNA and Surgeon  Anesthesia Plan  Comments: (Patient and family informed that patient is higher risk for complications from anesthesia during this procedure due to their medical history and age including but not limited to post operative cognitive dysfunction.  They voiced understanding.  Patient consented  for risks of anesthesia including but not limited to:  - adverse reactions to medications - damage to teeth, lips or other oral mucosa - sore throat or hoarseness - Damage to heart, brain, lungs or loss of life  Patient voiced understanding.)        Anesthesia Quick Evaluation

## 2020-06-14 NOTE — Progress Notes (Addendum)
ANTICOAGULATION CONSULT NOTE  Pharmacy Consult for Heparin drip (was on apixaban-last dose 12/19 @ 2247)  Indication: atrial fibrillation  Patient Measurements: Heparin Dosing Weight: 62.6 kg   Labs: Recent Labs    06/12/20 0442 06/12/20 0951 06/12/20 1753 06/13/20 0606 06/13/20 0820 06/13/20 1817 06/14/20 0135 06/14/20 0149 06/14/20 0851 06/14/20 2056  HGB 8.4*  --   --   --  8.4*  --  8.7*  --   --   --   HCT 27.0*  --   --   --  28.6*  --  28.5*  --   --   --   PLT 348  --   --   --  313  --  319  --   --   --   APTT  --  <24*   < >  --  131*   < > 101*  --  62* 61*  HEPARINUNFRC  --  >3.60*  --  2.76*  --   --   --  1.84*  --   --   CREATININE 0.93  --   --  0.92  --   --  0.84  --   --   --    < > = values in this interval not displayed.    Estimated Creatinine Clearance: 56.3 mL/min (by C-G formula based on SCr of 0.84 mg/dL).  Medical History: Past Medical History:  Diagnosis Date  . Anemia   . Anxiety   . Arthritis   . Atrial fibrillation (HCC)   . C. difficile colitis 09/2015  . COPD (chronic obstructive pulmonary disease) (HCC)   . Depression   . Dyspnea   . Dysrhythmia   . Fibromyalgia   . H/O tracheostomy   . Hep C w/ coma, chronic   . Hypertension   . MRSA pneumonia (HCC) 2017  . On home oxygen therapy    3 L/M   . Osteoporosis   . Peripheral neuropathy   . RLS (restless legs syndrome)   . S/P percutaneous endoscopic gastrostomy (PEG) tube placement (HCC) 09/2015  . Ventilator associated pneumonia [facility]) 10/2015   [facility], [place]   Assessment: Pharmacy consulted to dose heparin for AFib in this 69 year old female , was on Eliquis 5 mg PO BID prior but d/c'd due to upcoming tracheostomy procedure.   Will cover with Heparin until tracheostomy is done.   Last dose of Eliquis was on 12/19 @ 2247.   Goal of Therapy:  HL:  0.3 - 0.7 APTT = 66 - 102  Monitor platelets by anticoagulation protocol: Yes   Plan:  --12/22 2056 aPTT 61  seconds, subtherapeutic. Will increase heparin infusion to 900 units/hr --Heparin to stop this evening in preparation for trach 12/23. Will follow up anticoagulation plan following procedure --HL and CBC ordered with morning labs for now   Tressie Ellis 06/14/2020,9:51 PM

## 2020-06-14 NOTE — Consult Note (Signed)
PHARMACY CONSULT NOTE - FOLLOW UP  Pharmacy Consult for Electrolyte Monitoring and Replacement   Recent Labs: Potassium (mmol/L)  Date Value  06/14/2020 5.0   Magnesium (mg/dL)  Date Value  32/54/9826 2.2   Calcium (mg/dL)  Date Value  41/58/3094 8.8 (L)   Albumin (g/dL)  Date Value  07/68/0881 3.0 (L)   Phosphorus (mg/dL)  Date Value  04/23/5944 4.1   Sodium (mmol/L)  Date Value  06/14/2020 141     Assessment: 69 yo female  admitted with acute on chronic hypoxic hypercapnic respiratory failure and acute encephalopathy secondary to narcotic use,  AECOPD requiring mechanical intubation, and acute kidney injury/failure.    Goal of Therapy:  Electrolytes WNL  Plan:   No replacement needed.   Continue to follow along  Pricilla Riffle ,PharmD Clinical Pharmacist 06/14/2020 1:28 PM

## 2020-06-14 NOTE — Progress Notes (Signed)
ANTICOAGULATION CONSULT NOTE  Pharmacy Consult for Heparin drip (was on apixaban-last dose 12/19 @ 2247)  Indication: atrial fibrillation  Patient Measurements: Heparin Dosing Weight: 62.6 kg   Labs: Recent Labs    06/12/20 0442 06/12/20 0951 06/12/20 1753 06/13/20 0606 06/13/20 0820 06/13/20 1817 06/14/20 0135 06/14/20 0149 06/14/20 0851  HGB 8.4*  --   --   --  8.4*  --  8.7*  --   --   HCT 27.0*  --   --   --  28.6*  --  28.5*  --   --   PLT 348  --   --   --  313  --  319  --   --   APTT  --  <24*   < >  --  131* 103* 101*  --  62*  HEPARINUNFRC  --  >3.60*  --  2.76*  --   --   --  1.84*  --   CREATININE 0.93  --   --  0.92  --   --  0.84  --   --    < > = values in this interval not displayed.    Estimated Creatinine Clearance: 56.3 mL/min (by C-G formula based on SCr of 0.84 mg/dL).  Medical History: Past Medical History:  Diagnosis Date  . Anemia   . Anxiety   . Arthritis   . Atrial fibrillation (HCC)   . C. difficile colitis 09/2015  . COPD (chronic obstructive pulmonary disease) (HCC)   . Depression   . Dyspnea   . Dysrhythmia   . Fibromyalgia   . H/O tracheostomy   . Hep C w/ coma, chronic   . Hypertension   . MRSA pneumonia (HCC) 2017  . On home oxygen therapy    3 L/M   . Osteoporosis   . Peripheral neuropathy   . RLS (restless legs syndrome)   . S/P percutaneous endoscopic gastrostomy (PEG) tube placement (HCC) 09/2015  . Ventilator associated pneumonia [facility]) 10/2015   [facility], [place]   Assessment: Pharmacy consulted to dose heparin for AFib in this 69 year old female , was on Eliquis 5 mg PO BID prior but d/c'd due to upcoming tracheostomy procedure.   Will cover with Heparin until tracheostomy is done.   Last dose of Eliquis was on 12/19 @ 2247.   Goal of Therapy:  HL:  0.3 - 0.7 APTT = 66 - 102  Monitor platelets by anticoagulation protocol: Yes   Plan:  12/22 0851 aPTT 62 subtherapeutic. Heparin increased to 850  units/hr (previously supratherapeutic at this rate). Will recheck aPTT at 2100.  --Heparin to stop this evening in preparation for trach 12/23. Will follow up anticoagulation plan following procedure. --HL and CBC ordered with morning labs for now.   Pricilla Riffle, PharmD 06/14/2020,1:35 PM

## 2020-06-15 ENCOUNTER — Encounter
Admission: EM | Disposition: A | Discharge status: Home Health | Payer: Self-pay | Source: Home / Self Care | Attending: Internal Medicine

## 2020-06-15 ENCOUNTER — Inpatient Hospital Stay: Payer: Medicare Other

## 2020-06-15 ENCOUNTER — Encounter: Payer: Self-pay | Admitting: Otolaryngology

## 2020-06-15 ENCOUNTER — Inpatient Hospital Stay: Payer: Medicare Other | Admitting: Anesthesiology

## 2020-06-15 HISTORY — PX: TRACHEOSTOMY TUBE PLACEMENT: SHX814

## 2020-06-15 LAB — CBC
HCT: 25.9 % — ABNORMAL LOW (ref 36.0–46.0)
Hemoglobin: 7.9 g/dL — ABNORMAL LOW (ref 12.0–15.0)
MCH: 31.1 pg (ref 26.0–34.0)
MCHC: 30.5 g/dL (ref 30.0–36.0)
MCV: 102 fL — ABNORMAL HIGH (ref 80.0–100.0)
Platelets: 280 10*3/uL (ref 150–400)
RBC: 2.54 MIL/uL — ABNORMAL LOW (ref 3.87–5.11)
RDW: 14.7 % (ref 11.5–15.5)
WBC: 20.9 10*3/uL — ABNORMAL HIGH (ref 4.0–10.5)
nRBC: 0.6 % — ABNORMAL HIGH (ref 0.0–0.2)

## 2020-06-15 LAB — GLUCOSE, CAPILLARY
Glucose-Capillary: 107 mg/dL — ABNORMAL HIGH (ref 70–99)
Glucose-Capillary: 117 mg/dL — ABNORMAL HIGH (ref 70–99)
Glucose-Capillary: 117 mg/dL — ABNORMAL HIGH (ref 70–99)
Glucose-Capillary: 118 mg/dL — ABNORMAL HIGH (ref 70–99)
Glucose-Capillary: 143 mg/dL — ABNORMAL HIGH (ref 70–99)
Glucose-Capillary: 86 mg/dL (ref 70–99)
Glucose-Capillary: 92 mg/dL (ref 70–99)

## 2020-06-15 LAB — BASIC METABOLIC PANEL
Anion gap: 4 — ABNORMAL LOW (ref 5–15)
BUN: 48 mg/dL — ABNORMAL HIGH (ref 8–23)
CO2: 38 mmol/L — ABNORMAL HIGH (ref 22–32)
Calcium: 9 mg/dL (ref 8.9–10.3)
Chloride: 102 mmol/L (ref 98–111)
Creatinine, Ser: 0.65 mg/dL (ref 0.44–1.00)
GFR, Estimated: >60 mL/min (ref >60)
Glucose, Bld: 102 mg/dL — ABNORMAL HIGH (ref 70–99)
Potassium: 4.9 mmol/L (ref 3.5–5.1)
Sodium: 144 mmol/L (ref 135–145)

## 2020-06-15 LAB — HEPARIN LEVEL (UNFRACTIONATED): Heparin Unfractionated: 0.68 IU/mL (ref 0.30–0.70)

## 2020-06-15 LAB — MAGNESIUM: Magnesium: 2.1 mg/dL (ref 1.7–2.4)

## 2020-06-15 LAB — PHOSPHORUS: Phosphorus: 3.1 mg/dL (ref 2.5–4.6)

## 2020-06-15 SURGERY — CREATION, TRACHEOSTOMY
Anesthesia: General

## 2020-06-15 MED ORDER — PROPOFOL 10 MG/ML IV BOLUS
INTRAVENOUS | Status: AC
  Filled 2020-06-15: qty 20

## 2020-06-15 MED ORDER — METOPROLOL TARTRATE 5 MG/5ML IV SOLN
INTRAVENOUS | Status: AC
  Administered 2020-06-15: 5 mg
  Filled 2020-06-15: qty 5

## 2020-06-15 MED ORDER — VITAL AF 1.2 CAL PO LIQD
1000.0000 mL | ORAL | Status: DC
  Administered 2020-06-15: 1000 mL

## 2020-06-15 MED ORDER — MIDAZOLAM HCL 2 MG/2ML IJ SOLN
INTRAMUSCULAR | Status: AC
  Filled 2020-06-15: qty 2

## 2020-06-15 MED ORDER — MIDAZOLAM HCL 2 MG/2ML IJ SOLN
INTRAMUSCULAR | Status: DC | PRN
  Administered 2020-06-15: 2 mg via INTRAVENOUS

## 2020-06-15 MED ORDER — PHENYLEPHRINE HCL (PRESSORS) 10 MG/ML IV SOLN
INTRAVENOUS | Status: DC | PRN
  Administered 2020-06-15: 100 ug via INTRAVENOUS
  Administered 2020-06-15: 200 ug via INTRAVENOUS
  Administered 2020-06-15: 100 ug via INTRAVENOUS

## 2020-06-15 MED ORDER — METOPROLOL TARTRATE 5 MG/5ML IV SOLN: 5.0000 mg | Freq: Once | INTRAVENOUS | Status: DC

## 2020-06-15 MED ORDER — LACTATED RINGERS IV SOLN: INTRAVENOUS | Status: DC | PRN

## 2020-06-15 MED ORDER — ADENOSINE 12 MG/4ML IV SOLN
INTRAVENOUS | Status: AC
  Filled 2020-06-15: qty 4

## 2020-06-15 MED ORDER — ROCURONIUM BROMIDE 100 MG/10ML IV SOLN
INTRAVENOUS | Status: DC | PRN
  Administered 2020-06-15: 40 mg via INTRAVENOUS
  Administered 2020-06-15: 10 mg via INTRAVENOUS
  Administered 2020-06-15: 20 mg via INTRAVENOUS

## 2020-06-15 SURGICAL SUPPLY — 29 items
BLADE SURG 15 STRL LF DISP TIS (BLADE) ×1 IMPLANT
BLADE SURG 15 STRL SS (BLADE) ×2
BLADE SURG SZ11 CARB STEEL (BLADE) ×2 IMPLANT
CANISTER SUCT 1200ML W/VALVE (MISCELLANEOUS) ×2 IMPLANT
COVER WAND RF STERILE (DRAPES) ×2 IMPLANT
ELECT REM PT RETURN 9FT ADLT (ELECTROSURGICAL) ×2
ELECTRODE REM PT RTRN 9FT ADLT (ELECTROSURGICAL) ×1 IMPLANT
GAUZE PACKING IODOFORM 1/2 (PACKING) IMPLANT
GLOVE BIO SURGEON STRL SZ7.5 (GLOVE) ×2 IMPLANT
GOWN STRL REUS W/ TWL LRG LVL3 (GOWN DISPOSABLE) ×2 IMPLANT
GOWN STRL REUS W/TWL LRG LVL3 (GOWN DISPOSABLE) ×4
HEMOSTAT SURGICEL 2X3 (HEMOSTASIS) IMPLANT
HLDR TRACH TUBE NECKBAND 18 (MISCELLANEOUS) ×1 IMPLANT
HOLDER TRACH TUBE NECKBAND 18 (MISCELLANEOUS) ×1
KIT TURNOVER KIT A (KITS) ×2 IMPLANT
LABEL OR SOLS (LABEL) ×2 IMPLANT
MANIFOLD NEPTUNE II (INSTRUMENTS) ×2 IMPLANT
NS IRRIG 500ML POUR BTL (IV SOLUTION) ×2 IMPLANT
PACK HEAD/NECK (MISCELLANEOUS) ×2 IMPLANT
SHEARS HARMONIC 9CM CVD (BLADE) ×2 IMPLANT
SPONGE DRAIN TRACH 4X4 STRL 2S (GAUZE/BANDAGES/DRESSINGS) ×2 IMPLANT
SPONGE KITTNER 5P (MISCELLANEOUS) ×2 IMPLANT
SUCTION FRAZIER HANDLE 10FR (MISCELLANEOUS)
SUCTION TUBE FRAZIER 10FR DISP (MISCELLANEOUS) IMPLANT
SUT ETHILON 2 0 FS 18 (SUTURE) ×2 IMPLANT
SUT SILK 2 0 SH (SUTURE) ×1 IMPLANT
SUT VIC AB 3-0 PS2 18 (SUTURE) IMPLANT
TUBE TRACH SHILEY  6 DIST  CUF (TUBING) ×1 IMPLANT
TUBE TRACH SHILEY 8 DIST CUF (TUBING) IMPLANT

## 2020-06-15 NOTE — Transfer of Care (Signed)
Immediate Anesthesia Transfer of Care Note  Patient: Virginia [name]  Procedure(s) Performed: TRACHEOSTOMY (N/A )  Patient Location: PACU  Anesthesia Type:General  Level of Consciousness: unresponsive  Airway & Oxygen Therapy: Patient connected to tracheostomy mask oxygen  Post-op Assessment: Report given to RN and Post -op Vital signs reviewed and stable  Post vital signs: Reviewed and stable  Last Vitals:  Vitals Value Taken Time  BP    Temp    Pulse    Resp    SpO2      Last Pain:  Vitals:   06/15/20 0800  TempSrc: Esophageal  PainSc:          Complications: No complications documented.

## 2020-06-15 NOTE — Progress Notes (Signed)
Pt RAAS -1, opening eyes spontaneously, nods head appropriately. Tube feeds and heparin gtt STOPPED @ midnight in preparation for trach procedure in AM. Foley and Flexiseal remain in place, draining adequately. Pt remains on fentanyl gtt overnight. Turn q2. Pt in no acute distress @ this time. Will continue to monitor.

## 2020-06-15 NOTE — Consult Note (Signed)
PHARMACY CONSULT NOTE - FOLLOW UP  Pharmacy Consult for Electrolyte Monitoring and Replacement   Recent Labs: Potassium (mmol/L)  Date Value  06/15/2020 4.9   Magnesium (mg/dL)  Date Value  60/09/5995 2.1   Calcium (mg/dL)  Date Value  74/14/2395 9.0   Albumin (g/dL)  Date Value  32/07/3341 3.0 (L)   Phosphorus (mg/dL)  Date Value  56/86/1683 3.1   Sodium (mmol/L)  Date Value  06/15/2020 144     Assessment: 69 yo female  admitted with acute on chronic hypoxic hypercapnic respiratory failure and acute encephalopathy secondary to narcotic use,  AECOPD requiring mechanical intubation, and acute kidney injury/failure.    Goal of Therapy:  Electrolytes WNL  Plan:   No replacement needed  Continue to follow along  Tressie Ellis 06/15/2020 8:41 AM

## 2020-06-15 NOTE — Plan of Care (Signed)

## 2020-06-15 NOTE — Plan of Care (Signed)
Pt remains intubated, failure to wean. Plan for trach today at 1045. Pt awake, able to follow commands, weak in all extremities. TF and heparin held at midnight for procedure today. Son and daughter in law updated yesterday afternoon while confirming consent for trach today. Fentanyl gtt running for pt comfort until trach placement.  Problem: Education: Goal: Knowledge of General Education information will improve Description: Including pain rating scale, medication(s)/side effects and non-pharmacologic comfort measures Outcome: Not Progressing   Problem: Health Behavior/Discharge Planning: Goal: Ability to manage health-related needs will improve Outcome: Not Progressing   Problem: Clinical Measurements: Goal: Respiratory complications will improve Outcome: Not Progressing   Problem: Activity: Goal: Risk for activity intolerance will decrease Outcome: Not Progressing   Problem: Coping: Goal: Level of anxiety will decrease Outcome: Not Progressing   Problem: Elimination: Goal: Will not experience complications related to urinary retention Outcome: Not Progressing   Problem: Pain Managment: Goal: General experience of comfort will improve Outcome: Not Progressing   Problem: Clinical Measurements: Goal: Ability to maintain clinical measurements within normal limits will improve Outcome: Progressing Goal: Will remain free from infection Outcome: Progressing Goal: Diagnostic test results will improve Outcome: Progressing Goal: Cardiovascular complication will be avoided Outcome: Progressing   Problem: Nutrition: Goal: Adequate nutrition will be maintained Outcome: Progressing   Problem: Elimination: Goal: Will not experience complications related to bowel motility Outcome: Progressing   Problem: Safety: Goal: Ability to remain free from injury will improve Outcome: Progressing   Problem: Skin Integrity: Goal: Risk for impaired skin integrity will decrease Outcome:  Progressing

## 2020-06-15 NOTE — H&P (Signed)
History and physical reviewed. No change in medical status reported by the primary team, appears stable for surgery. All questions regarding the procedure answered, and family expressed understanding of the procedure.  Sandi Mealy @TODAY @

## 2020-06-15 NOTE — Op Note (Signed)
06/15/2020  12:12 PM    Virginia [name]  [id number]   Pre-Op Diagnosis:  Respiratory failure  Post-op Diagnosis: Respiratory failure  Procedure: Elective Tracheostomy  Surgeon:  Sandi Mealy  Anesthesia:  General endotracheal anesthesia  EBL:  Less than 10 cc  Complications:  None  Findings: tracheal scarring from prior surgery  Procedure: The patient was taken to the Operating Room from the CCU, already intubated, and placed in the supine position.  After induction of general anesthesia, the patient was placed on a shoulder roll with the neck extended. The skin was injected along the proposed incision line over the trachea with 1% lidocaine with epinephrine, 1:100,000. The area was then prepped and draped in the usual sterile fashion.  A 15 blade was then used to incise the skin in a horizontal incision over the trachea. The dissection was carried down to the subcutaneous tissues and through the platysma with the Bovie. Anterior jugular veins were divided with the harmonic scalpel for hemostasis. The strap muscles were divided in the midline and retracted laterally. The thyroid isthmus was exposed and divided in the midline over the trachea and dissected away from the anterior aspect of the trachea with the Bovie. With hemostasis obtained, the anesthesiologist was alerted that the airway was about to be entered so that the oxygen concentration could be lowered to reduce fire risk. The scrub tech prepared the tracheostomy tube, confirming no leak in the balloon cuff. There was some cartilage scarring from prior surgery noted, but this did not impede the procedure. The trachea was then incised between the 2nd and third tracheal rings, and an inferiorly based tracheal flap created by cutting through the third tracheal ring laterally. This was sutured up to the skin with a 3-0 silk suture to help create a tracheocutaneous tract. The airway was suctioned and, after the anesthesiologist  pulled the endotracheal tube back,  a #6 Shiley cuffed tracheostomy tube was inserted into the tracheal lumen. The inner cannula was placed, the cuff inflated,  and the patient hooked to the anesthesia circuit for ventilation. CO2 return and adequate ventilation was confirmed with the anesthesiologist. Hemostasis was confirmed and the flange of the tracheostomy tube was sutured to the skin with 2-0 Ethilon suture. A trach tie was placed around the neck to further secure the tracheostomy tube. The tube was suctioned to clear secretions. Betadine soaked gauze was placed around the wound.  The patient was then returned to the anesthesiologist and taken to the CCU in stable condition.  Disposition:   Return to the CCU  Plan: Routine trach care and suctioning each shift and PRN. Vent settings per CCU admitting physician. Sutures can be removed in a week.  Sandi Mealy 06/15/2020 12:12 PM

## 2020-06-15 NOTE — Anesthesia Postprocedure Evaluation (Signed)
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   Last Vitals:  Vitals:   06/15/20 0900 06/15/20 1000  BP: (!) 169/103 111/80  Pulse: (!) 118 80  Resp: 16 15  Temp: 37.5 C 37.4 C  SpO2: 93% 94%    Last Pain:  Vitals:   06/15/20 0800  TempSrc: Esophageal  PainSc:                  Manfred Arch

## 2020-06-15 NOTE — Progress Notes (Signed)
Trach placement today. Dopov placed after returning from surgery. Tube feeding restarted at previous rate and formula after dopov confirmation. Attempt to place peripheral IV to allow for d/c of central line, but unsuccessful. IV team contacted and awaiting evaluation. Weaning fentanyl gtt. Son visited this afternoon for about an hour, full update provided.

## 2020-06-15 NOTE — Progress Notes (Signed)
CRITICAL CARE PROGRESS NOTE    Name: Virginia [name] MRN: [id number] DOB: 05/23/1951     LOS: 71   SUBJECTIVE FINDINGS & SIGNIFICANT EVENTS    Patient description:  69 yo female with severe COPD admitted with acute on chronic hypoxic hypercapnic respiratory failure and acute encephalopathy secondary to narcotic use and AECOPD requiring mechanical intubation  12/14 admitted to ICU for COPD exacerbation 12/15 plan for SAT/SBT 12/16 failed SAT/SBT, will try again today 12/16 s/p extubation to BiPAP 12/17 resp status stilltenuous 12/18 severe resp failure high risk for intubation 12/18 Re intubated for severe COPD 12/19 severe resp  Failure, ENT consulted for trach  06/12/20- sedated to RASS-2.  Plan for trache as per previous discussion with critical care team and family. DCd amio gtt today . Weaning solumedrol to 40 daily from BID. Patient on Levophed and metoprolol we will remove arrythmogenic vasopressor today.   12/21- Trache postponed to 12/23 due to anticoagulation. Patient weaned down to 21% will attempt SBT again today- patient failed with severe hypercapnia, plan continues to be for trache. Family updated today. Poke with Judeen Hammans DIL.  12/22- no events overnight. Plan for trache in am.   12/23- pt s/p trache and OGT for nourishment. Patient is able to respond appropriately to verbal communication.   Lines/tubes : Airway 7.5 mm (Active)  Secured at (cm) 22 cm 06/12/20 0807  Measured From Lips 06/12/20 0807  Secured Location Left 06/12/20 0807  Secured By [employer] 06/12/20 0807  Tube Holder Repositioned Yes 06/12/20 0807  Prone position No 06/12/20 0807  Cuff Pressure (cm H2O) 26 cm H2O 06/12/20 0807  Site Condition Dry;Cool 06/12/20 0807     CVC Triple Lumen 06/10/20 Left  Internal jugular (Active)  Indication for Insertion or Continuance of Line Chronic illness with exacerbations (CF, Sickle Cell, etc.) 06/11/20 2100  Site Assessment Clean;Dry;Intact 06/11/20 2100  Proximal Lumen Status Infusing;Flushed;Blood return noted 06/11/20 2100  Medial Lumen Status Infusing 06/11/20 2100  Distal Lumen Status Infusing;Flushed;Blood return noted 06/11/20 2100  Dressing Type Transparent;Occlusive 06/11/20 2100  Dressing Status Clean;Dry;Intact 06/11/20 2100  Antimicrobial disc in place? Yes 06/11/20 2100  Line Care Connections checked and tightened 06/11/20 2100  Dressing Intervention New dressing 06/10/20 2100  Dressing Change Due 06/17/20 06/11/20 2100     NG/OG Tube Orogastric Center mouth Xray (Active)  Cm Marking at Nare/Corner of Mouth (if applicable) 61 cm 40/76/80 2000  Site Assessment Clean;Dry;Intact 06/11/20 2000  Ongoing Placement Verification No change in cm markings or external length of tube from initial placement;No change in respiratory status;No acute changes, not attributed to clinical condition 06/11/20 2000  Status Infusing tube feed 06/11/20 2000  Drainage Appearance None 06/11/20 1600  Intake (mL) 30 mL 06/11/20 1600     Urethral Catheter Skeet Latch, RN Double-lumen;Latex 14 Fr. (Active)  Indication for Insertion or Continuance of Catheter Therapy based on hourly urine output monitoring and documentation for critical condition (NOT STRICT I&O) 06/12/20 0800  Site Assessment Clean;Intact 06/12/20 0800  Catheter Maintenance Bag below level of bladder;Catheter secured;Drainage bag/tubing not touching floor;Insertion date on drainage bag;No dependent loops;Seal intact 06/12/20 0800  Collection Container Standard drainage bag 06/12/20 0800  Securement Method Securing device (Describe) 06/12/20 0800  Urinary Catheter Interventions (if applicable) Unclamped 88/11/03 0800  Output (mL) 60 mL 06/11/20 1800    Microbiology/Sepsis  markers: Results for orders placed or performed during the hospital encounter of 06/06/20  Blood culture (routine x 2)     Status: None  Collection Time: 06/06/20 12:07 PM   Specimen: BLOOD  Result Value Ref Range Status   Specimen Description BLOOD RIGHT [facility]  Final   Special Requests   Final    BOTTLES DRAWN AEROBIC AND ANAEROBIC Blood Culture adequate volume   Culture   Final    NO GROWTH 5 DAYS Performed at [facility], [address], [place], [place] [address]    Report Status 06/11/2020 FINAL  Final  Blood culture (routine x 2)     Status: None   Collection Time: 06/06/20 12:07 PM   Specimen: BLOOD  Result Value Ref Range Status   Specimen Description BLOOD LEFT AC  Final   Special Requests   Final    BOTTLES DRAWN AEROBIC AND ANAEROBIC Blood Culture adequate volume   Culture   Final    NO GROWTH 5 DAYS Performed at [facility], [address] [place], [place] [address]    Report Status 06/11/2020 FINAL  Final  Resp Panel by RT-PCR (Flu A&B, Covid) Nasopharyngeal Swab     Status: None   Collection Time: 06/06/20 12:07 PM   Specimen: Nasopharyngeal Swab; Nasopharyngeal(NP) swabs in vial transport medium  Result Value Ref Range Status   SARS Coronavirus 2 by RT PCR NEGATIVE NEGATIVE Final    Comment: (NOTE) SARS-CoV-2 target nucleic acids are NOT DETECTED.  The SARS-CoV-2 RNA is generally detectable in upper respiratory specimens during the acute phase of infection. The lowest concentration of SARS-CoV-2 viral copies this assay can detect is 138 copies/mL. A negative result does not preclude SARS-Cov-2 infection and should not be used as the sole basis for treatment or other patient management decisions. A negative result may occur with  improper specimen collection/handling, submission of specimen other than nasopharyngeal swab, presence of viral mutation(s) within the areas targeted by this assay, and inadequate number of viral copies(<138  copies/mL). A negative result must be combined with clinical observations, patient history, and epidemiological information. The expected result is Negative.  Fact Sheet for Patients:  EntrepreneurPulse.com.au  Fact Sheet for Healthcare Providers:  IncredibleEmployment.be  This test is no t yet approved or cleared by the [place] FDA and  has been authorized for detection and/or diagnosis of SARS-CoV-2 by FDA under an Emergency Use Authorization (EUA). This EUA will remain  in effect (meaning this test can be used) for the duration of the COVID-19 declaration under Section 564(b)(1) of the Act, 21 U.S.C.section 360bbb-3(b)(1), unless the authorization is terminated  or revoked sooner.       Influenza A by PCR NEGATIVE NEGATIVE Final   Influenza B by PCR NEGATIVE NEGATIVE Final    Comment: (NOTE) The Xpert Xpress SARS-CoV-2/FLU/RSV plus assay is intended as an aid in the diagnosis of influenza from Nasopharyngeal swab specimens and should not be used as a sole basis for treatment. Nasal washings and aspirates are unacceptable for Xpert Xpress SARS-CoV-2/FLU/RSV testing.  Fact Sheet for Patients: EntrepreneurPulse.com.au  Fact Sheet for Healthcare Providers: IncredibleEmployment.be  This test is not yet approved or cleared by the [place] FDA and has been authorized for detection and/or diagnosis of SARS-CoV-2 by FDA under an Emergency Use Authorization (EUA). This EUA will remain in effect (meaning this test can be used) for the duration of the COVID-19 declaration under Section 564(b)(1) of the Act, 21 U.S.C. section 360bbb-3(b)(1), unless the authorization is terminated or revoked.  Performed at [facility], [address] [place], [place] [address]   MRSA PCR Screening  Status: None   Collection Time: 06/06/20 11:00 PM   Specimen: Nasopharyngeal  Result Value Ref Range  Status   MRSA by PCR NEGATIVE NEGATIVE Final    Comment:        The GeneXpert MRSA Assay (FDA approved for NASAL specimens only), is one component of a comprehensive MRSA colonization surveillance program. It is not intended to diagnose MRSA infection nor to guide or monitor treatment for MRSA infections. Performed at [facility], [address] [place], [place] [address]     Anti-infectives:  Anti-infectives (From admission, onward)   Start     Dose/Rate Route Frequency Ordered Stop   06/07/20 1400  azithromycin (ZITHROMAX) 500 mg in sodium chloride 0.9 % 250 mL IVPB  Status:  Discontinued        500 mg 250 mL/hr over 60 Minutes Intravenous Every 24 hours 06/06/20 2030 06/06/20 2044   06/07/20 1400  azithromycin (ZITHROMAX) 500 mg in sodium chloride 0.9 % 250 mL IVPB  Status:  Discontinued        500 mg 250 mL/hr over 60 Minutes Intravenous Every 24 hours 06/06/20 2044 06/08/20 1031   06/06/20 1415  cefTRIAXone (ROCEPHIN) 2 g in sodium chloride 0.9 % 100 mL IVPB        2 g 200 mL/hr over 30 Minutes Intravenous  Once 06/06/20 1407 06/06/20 1514   06/06/20 1415  azithromycin (ZITHROMAX) 500 mg in sodium chloride 0.9 % 250 mL IVPB        500 mg 250 mL/hr over 60 Minutes Intravenous  Once 06/06/20 1407 06/06/20 1708       Consults:   ENT - tracheostomy  PAST MEDICAL HISTORY   Past Medical History:  Diagnosis Date  . Anemia   . Anxiety   . Arthritis   . Atrial fibrillation ([place])   . C. difficile colitis 09/2015  . COPD (chronic obstructive pulmonary disease) ([place])   . Depression   . Dyspnea   . Dysrhythmia   . Fibromyalgia   . H/O tracheostomy   . Hep C w/ coma, chronic   . Hypertension   . MRSA pneumonia ([place]) 2017  . On home oxygen therapy    3 L/M   . Osteoporosis   . Peripheral neuropathy   . RLS (restless legs syndrome)   . S/P percutaneous endoscopic gastrostomy (PEG) tube placement ([place]) 09/2015  . Ventilator associated pneumonia [facility])  10/2015   [facility] [place]     SURGICAL HISTORY   Past Surgical History:  Procedure Laterality Date  . ABDOMINAL HYSTERECTOMY    . BREAST SURGERY Bilateral    Breast Implants  . DILATION AND CURETTAGE OF UTERUS    . REVERSE SHOULDER ARTHROPLASTY Right 03/06/2017   Procedure: REVERSE SHOULDER ARTHROPLASTY;  Surgeon: Corky Mull, MD;  Location: [facility] ORS;  Service: Orthopedics;  Laterality: Right;  . ROTATOR CUFF REPAIR Bilateral      FAMILY HISTORY   Family History  Problem Relation Age of Onset  . Hypertension Mother      SOCIAL HISTORY   Social History   Tobacco Use  . Smoking status: Former Smoker    Packs/day: 1.50    Types: Cigarettes    Quit date: 10/05/2015    Years since quitting: 4.6  . Smokeless tobacco: Never Used  Vaping Use  . Vaping Use: Some days  . Last attempt to quit: 10/05/2015  Substance Use Topics  . Alcohol use: No  . Drug use: No  MEDICATIONS   Current Medication:  Current Facility-Administered Medications:  .  0.9 %  sodium chloride infusion, 250 mL, Intravenous, Continuous, Duffy Bruce, MD, Stopped at 06/14/20 0800 .  0.9 %  sodium chloride infusion, 250 mL, Intravenous, PRN, Mortimer Fries, Kurian, MD .  0.9 %  sodium chloride infusion, 250 mL, Intravenous, Continuous, Kasa, Kurian, MD, Stopped at 06/12/20 1131 .  acetaminophen (TYLENOL) 160 MG/5ML solution 650 mg, 650 mg, Per Tube, Q4H PRN, Flora Lipps, MD .  amitriptyline (ELAVIL) tablet 25 mg, 25 mg, Per Tube, QHS, Flora Lipps, MD, 25 mg at 06/14/20 2143 .  calcium-vitamin D (OSCAL WITH D) 500-200 MG-UNIT per tablet 1 tablet, 1 tablet, Per Tube, Q breakfast, Flora Lipps, MD, 1 tablet at 06/15/20 0743 .  chlorhexidine gluconate (MEDLINE KIT) (PERIDEX) 0.12 % solution 15 mL, 15 mL, Mouth Rinse, BID, Kasa, Kurian, MD, 15 mL at 06/15/20 0754 .  Chlorhexidine Gluconate Cloth 2 % PADS 6 each, 6 each, Topical, Daily, Flora Lipps, MD, 6 each at 06/14/20 1130 .  diazepam  (VALIUM) tablet 5 mg, 5 mg, Per Tube, Q8H PRN, Rust-Chester, Britton L, NP, 5 mg at 06/11/20 0523 .  diltiazem (CARDIZEM) tablet 30 mg, 30 mg, Per Tube, Q6H, Belue, Alver Sorrow, RPH, 30 mg at 06/14/20 2326 .  docusate (COLACE) 50 MG/5ML liquid 100 mg, 100 mg, Per Tube, BID, Renda Rolls, RPH, 100 mg at 06/13/20 2222 .  fentaNYL 2553mg in NS 2547m(1068mml) infusion-PREMIX, 0-400 mcg/hr, Intravenous, Continuous, Kasa, Kurian, MD, Last Rate: 25 mL/hr at 06/15/20 0746, 250 mcg/hr at 06/15/20 0746 .  gabapentin (NEURONTIN) capsule 300 mg, 300 mg, Per Tube, TID, KasFlora LippsD, 300 mg at 06/14/20 2142 .  insulin aspart (novoLOG) injection 0-9 Units, 0-9 Units, Subcutaneous, Q4H, Rust-Chester, Britton L, NP, 1 Units at 06/14/20 2326 .  ipratropium-albuterol (DUONEB) 0.5-2.5 (3) MG/3ML nebulizer solution 3 mL, 3 mL, Nebulization, Q6H, Blakeney, Dana G, NP, 3 mL at 06/15/20 0338 .  ipratropium-albuterol (DUONEB) 0.5-2.5 (3) MG/3ML nebulizer solution 3 mL, 3 mL, Nebulization, Q6H PRN, BlaAwilda BillP .  MEDLINE mouth rinse, 15 mL, Mouth Rinse, 10 times per day, KasFlora LippsD, 15 mL at 06/15/20 0514 .  methylPREDNISolone sodium succinate (SOLU-MEDROL) 40 mg/mL injection 40 mg, 40 mg, Intravenous, Q24H, AleLanney Ginsuad, MD, 40 mg at 06/14/20 0934 .  metoprolol tartrate (LOPRESSOR) tablet 50 mg, 50 mg, Per Tube, BID, KasFlora LippsD, 50 mg at 06/14/20 2142 .  midodrine (PROAMATINE) tablet 10 mg, 10 mg, Per Tube, TID WC, Giordano Getman, MD, 10 mg at 06/14/20 1648 .  norepinephrine (LEVOPHED) 16 mg in 250m66memix infusion, 0-40 mcg/min, Intravenous, Titrated, Rust-Chester, BritHuel Cote, Stopped at 06/14/20 1105 .  ondansetron (ZOFRAN) injection 4 mg, 4 mg, Intravenous, Q6H PRN, KasaMortimer Friesrian, MD .  pantoprazole (PROTONIX) injection 40 mg, 40 mg, Intravenous, QHS, Kasa, Kurian, MD, 40 mg at 06/14/20 2142 .  polyethylene glycol (MIRALAX / GLYCOLAX) packet 17 g, 17 g, Per Tube, Daily PRN, KasaMortimer Friesrian,  MD .  polyethylene glycol (MIRALAX / GLYCOLAX) packet 17 g, 17 g, Per Tube, Daily, KasaFlora Lipps, 17 g at 06/12/20 0919 .  propofol (DIPRIVAN) 1000 MG/100ML infusion, 5-80 mcg/kg/min, Intravenous, Titrated, KasaFlora Lipps, Stopped at 06/14/20 0803 .  QUEtiapine (SEROQUEL) tablet 25 mg, 25 mg, Per Tube, QHS, KasaFlora Lipps, 25 mg at 06/14/20 2143 .  sodium chloride flush (NS) 0.9 % injection 10-40 mL, 10-40 mL, Intracatheter, Q12H, Kasa, Kurian, MD, 10 mL at 06/14/20  2144 .  sodium chloride flush (NS) 0.9 % injection 10-40 mL, 10-40 mL, Intracatheter, PRN, Mortimer Fries, Kurian, MD .  sodium chloride flush (NS) 0.9 % injection 3 mL, 3 mL, Intravenous, Q12H, Kasa, Kurian, MD, 3 mL at 06/13/20 2222 .  sodium chloride flush (NS) 0.9 % injection 3 mL, 3 mL, Intravenous, PRN, Flora Lipps, MD    ALLERGIES   Patient has no known allergies.    REVIEW OF SYSTEMS    Unable to obtain on sedation and mechanical ventilation  PHYSICAL EXAMINATION   Vital Signs: Temp:  [97.7 F (36.5 C)-99.14 F (37.3 C)] 98.96 F (37.2 C) (12/23 0700) Pulse Rate:  [83-122] 99 (12/23 0700) Resp:  [12-29] 14 (12/23 0700) BP: (117-160)/(75-100) 143/93 (12/23 0700) SpO2:  [91 %-96 %] 95 % (12/23 0700) FiO2 (%):  [30 %] 30 % (12/23 0339) Weight:  [61.6 kg] 61.6 kg (12/23 0500)  GENERAL:age appropriate sedated on MV HEAD: Normocephalic, atraumatic.  EYES: Pupils equal, round, reactive to light.  No scleral icterus.  MOUTH: Moist mucosal membrane. NECK: Supple. No thyromegaly. No nodules. No JVD.  PULMONARY: mild rhonchi CARDIOVASCULAR: S1 and S2. Regular rate and rhythm. No murmurs, rubs, or gallops.  GASTROINTESTINAL: Soft, nontender, non-distended. No masses. Positive bowel sounds. No hepatosplenomegaly.  MUSCULOSKELETAL: No swelling, clubbing, or edema.  NEUROLOGIC: GCS3T SKIN:intact,warm,dry   PERTINENT DATA     Infusions: . sodium chloride Stopped (06/14/20 0800)  . sodium chloride    . sodium  chloride Stopped (06/12/20 1131)  . fentaNYL infusion INTRAVENOUS 250 mcg/hr (06/15/20 0746)  . norepinephrine (LEVOPHED) Adult infusion Stopped (06/14/20 1105)  . propofol (DIPRIVAN) infusion Stopped (06/14/20 0803)   Scheduled Medications: . amitriptyline  25 mg Per Tube QHS  . calcium-vitamin D  1 tablet Per Tube Q breakfast  . chlorhexidine gluconate (MEDLINE KIT)  15 mL Mouth Rinse BID  . Chlorhexidine Gluconate Cloth  6 each Topical Daily  . diltiazem  30 mg Per Tube Q6H  . docusate  100 mg Per Tube BID  . gabapentin  300 mg Per Tube TID  . insulin aspart  0-9 Units Subcutaneous Q4H  . ipratropium-albuterol  3 mL Nebulization Q6H  . mouth rinse  15 mL Mouth Rinse 10 times per day  . methylPREDNISolone (SOLU-MEDROL) injection  40 mg Intravenous Q24H  . metoprolol tartrate  50 mg Per Tube BID  . midodrine  10 mg Per Tube TID WC  . pantoprazole (PROTONIX) IV  40 mg Intravenous QHS  . polyethylene glycol  17 g Per Tube Daily  . QUEtiapine  25 mg Per Tube QHS  . sodium chloride flush  10-40 mL Intracatheter Q12H  . sodium chloride flush  3 mL Intravenous Q12H   PRN Medications: sodium chloride, acetaminophen (TYLENOL) oral liquid 160 mg/5 mL, diazepam, ipratropium-albuterol, ondansetron (ZOFRAN) IV, polyethylene glycol, sodium chloride flush, sodium chloride flush Hemodynamic parameters:   Intake/Output: 12/22 0701 - 12/23 0700 In: 1636.8 [I.V.:626.8; NG/GT:1010] Out: 2845 [Urine:2290; Emesis/NG output:125; Stool:430]  Ventilator  Settings: Vent Mode: PRVC FiO2 (%):  [30 %] 30 % Set Rate:  [14 bmp] 14 bmp Vt Set:  [420 mL] 420 mL PEEP:  [5 cmH20] 5 cmH20   LAB RESULTS:  Basic Metabolic Panel: Recent Labs  Lab 06/11/20 0455 06/12/20 0442 06/13/20 0606 06/14/20 0135 06/15/20 0315  NA 142 138 140 141 144  K 4.4 4.6 4.6 5.0 4.9  CL 103 100 103 103 102  CO2 29 31 32 33* 38*  GLUCOSE 167* 183* 114* 113*  102*  BUN 35* 36* 40* 43* 48*  CREATININE 1.02* 0.93 0.92 0.84  0.65  CALCIUM 9.4 8.9 8.9 8.8* 9.0  MG 2.0 2.2 2.1 2.2 2.1  PHOS 3.0 4.0 4.1 4.1 3.1   Liver Function Tests: Recent Labs  Lab 06/10/20 0422  AST 23  ALT 25  ALKPHOS 13*  BILITOT 0.5  PROT 5.5*  ALBUMIN 3.0*   No results for input(s): LIPASE, AMYLASE in the last 168 hours. No results for input(s): AMMONIA in the last 168 hours. CBC: Recent Labs  Lab 06/11/20 0455 06/12/20 0442 06/13/20 0820 06/14/20 0135 06/15/20 0315  WBC 11.5* 13.3* 22.1* 23.3* 20.9*  HGB 8.2* 8.4* 8.4* 8.7* 7.9*  HCT 26.8* 27.0* 28.6* 28.5* 25.9*  MCV 100.0 98.9 104.0* 102.9* 102.0*  PLT 298 348 313 319 280   Cardiac Enzymes: No results for input(s): CKTOTAL, CKMB, CKMBINDEX, TROPONINI in the last 168 hours. BNP: Invalid input(s): POCBNP CBG: Recent Labs  Lab 06/14/20 1615 06/14/20 1936 06/14/20 2322 06/15/20 0328 06/15/20 0724  GLUCAP 181* 128* 122* 86 92       IMAGING RESULTS:  Imaging: DG Chest Port 1 View  Result Date: 06/14/2020 CLINICAL DATA:  Acute respiratory failure EXAM: PORTABLE CHEST 1 VIEW COMPARISON:  Radiograph 06/11/2020, CT 07/30/2017 FINDINGS: Endotracheal tube is in the mid trachea, proximally 3.5 cm from the carina. Transesophageal temperature probe projects in the region of the midthoracic esophagus. Transesophageal tube tip and side port distal to the GE junction, beyond the margins of imaging. Left internal jugular approach central venous catheter tip terminates in the left brachiocephalic vein near the superior caval confluence. Telemetry leads overlie the chest. Some chronic interstitial and bronchitic features compatible with COPD. Increased attenuation of the lung bases at least partially attributable to bilateral breast prostheses. No new airspace disease. No pneumothorax or effusion. Prior reverse right shoulder arthroplasty. No acute osseous or soft tissue abnormality. Degenerative changes are present in the imaged spine and shoulders. IMPRESSION: 1. Lines and  tubes as described above. 2. Chronic interstitial and bronchitic features compatible with COPD. 3. No acute cardiopulmonary abnormality. 4. Bilateral breast prostheses, partially obscuring portions of the lung bases. Electronically Signed   By: Lovena Le M.D.   On: 06/14/2020 06:49   '@PROBHOSP' @ No results found.   ASSESSMENT AND PLAN    -Multidisciplinary rounds held today  Acute Hypoxic Respiratory Failure -FiO2 is at 30% today -AECOPD  -continue Full MV support -continue Bronchodilator Therapy -Wean Fio2 and PEEP as tolerated -will perform SAT/SBT when respiratory parameters are met -s/p KUB - OGT advanced as indicated by radiologist -s/p Tracheostomy   Acute on chronic diastolic CHF  - patient is on levophed and metoprolol - will get rid of levophed and use OGT midodrine for vasopressor support   GI/Nutrition GI PROPHYLAXIS as indicated DIET-->TF's as tolerated Constipation protocol as indicated  ENDO - ICU hypoglycemic\Hyperglycemia protocol -check FSBS per protocol   ELECTROLYTES -follow labs as needed -replace as needed -pharmacy consultation   DVT/GI PRX ordered -SCDs  TRANSFUSIONS AS NEEDED MONITOR FSBS ASSESS the need for LABS as needed   Critical care provider statement:    Critical care time (minutes):  33   Critical care time was exclusive of:  Separately billable procedures and treating other patients   Critical care was necessary to treat or prevent imminent or life-threatening deterioration of the following conditions:  acute hypoxemic respiratory failure, AECOPD, multiple comorbid conditions   Critical care was time spent personally by me on the following  activities:  Development of treatment plan with patient or surrogate, discussions with consultants, evaluation of patient's response to treatment, examination of patient, obtaining history from patient or surrogate, ordering and performing treatments and interventions, ordering and review of  laboratory studies and re-evaluation of patient's condition.  I assumed direction of critical care for this patient from another provider in my specialty: no    This document was prepared using Dragon voice recognition software and may include unintentional dictation errors.    Ottie Glazier, M.D.  Division of [place]

## 2020-06-15 NOTE — Progress Notes (Signed)
BRIEF CRITICAL CARE NOTE  Pt developed SVT on telemetry (HR 190's).  Pt is s/p Trach earlier today, but is awake and alert on 150 mcg Fentanyl gtt.  She denies being symptomatic, and is normotensive.    Pt placed on Pads with plan to give 6 mg Adenosine.  Before adenosine able to be given, pt converted to Sinus tachycardia (HR 120's).  Administered 5 mg Metoprolol IV x1 with improvement to NSR.  Continue to monitor.     Harlon Ditty, AGACNP-BC [facility] Pager: [phone]

## 2020-06-16 ENCOUNTER — Encounter: Payer: Self-pay | Admitting: Internal Medicine

## 2020-06-16 DIAGNOSIS — J9602 Acute respiratory failure with hypercapnia: Secondary | ICD-10-CM | POA: Diagnosis not present

## 2020-06-16 DIAGNOSIS — Z515 Encounter for palliative care: Secondary | ICD-10-CM | POA: Diagnosis not present

## 2020-06-16 DIAGNOSIS — Z7189 Other specified counseling: Secondary | ICD-10-CM

## 2020-06-16 DIAGNOSIS — J9601 Acute respiratory failure with hypoxia: Secondary | ICD-10-CM | POA: Diagnosis not present

## 2020-06-16 LAB — BASIC METABOLIC PANEL
Anion gap: 6 (ref 5–15)
BUN: 35 mg/dL — ABNORMAL HIGH (ref 8–23)
CO2: 40 mmol/L — ABNORMAL HIGH (ref 22–32)
Calcium: 8.7 mg/dL — ABNORMAL LOW (ref 8.9–10.3)
Chloride: 100 mmol/L (ref 98–111)
Creatinine, Ser: 0.57 mg/dL (ref 0.44–1.00)
GFR, Estimated: >60 mL/min (ref >60)
Glucose, Bld: 141 mg/dL — ABNORMAL HIGH (ref 70–99)
Potassium: 4.1 mmol/L (ref 3.5–5.1)
Sodium: 146 mmol/L — ABNORMAL HIGH (ref 135–145)

## 2020-06-16 LAB — CBC
HCT: 25.4 % — ABNORMAL LOW (ref 36.0–46.0)
Hemoglobin: 7.9 g/dL — ABNORMAL LOW (ref 12.0–15.0)
MCH: 31.7 pg (ref 26.0–34.0)
MCHC: 31.1 g/dL (ref 30.0–36.0)
MCV: 102 fL — ABNORMAL HIGH (ref 80.0–100.0)
Platelets: 256 10*3/uL (ref 150–400)
RBC: 2.49 MIL/uL — ABNORMAL LOW (ref 3.87–5.11)
RDW: 14.6 % (ref 11.5–15.5)
WBC: 19.3 10*3/uL — ABNORMAL HIGH (ref 4.0–10.5)
nRBC: 0.3 % — ABNORMAL HIGH (ref 0.0–0.2)

## 2020-06-16 LAB — MAGNESIUM: Magnesium: 2 mg/dL (ref 1.7–2.4)

## 2020-06-16 LAB — GLUCOSE, CAPILLARY
Glucose-Capillary: 118 mg/dL — ABNORMAL HIGH (ref 70–99)
Glucose-Capillary: 116 mg/dL — ABNORMAL HIGH (ref 70–99)
Glucose-Capillary: 167 mg/dL — ABNORMAL HIGH (ref 70–99)
Glucose-Capillary: 151 mg/dL — ABNORMAL HIGH (ref 70–99)
Glucose-Capillary: 149 mg/dL — ABNORMAL HIGH (ref 70–99)
Glucose-Capillary: 126 mg/dL — ABNORMAL HIGH (ref 70–99)
Glucose-Capillary: 141 mg/dL — ABNORMAL HIGH (ref 70–99)

## 2020-06-16 LAB — TRIGLYCERIDES: Triglycerides: 151 mg/dL — ABNORMAL HIGH (ref <150)

## 2020-06-16 LAB — PHOSPHORUS: Phosphorus: 2.9 mg/dL (ref 2.5–4.6)

## 2020-06-16 MED ORDER — DIGOXIN 0.25 MG/ML IJ SOLN
0.5000 mg | Freq: Once | INTRAMUSCULAR | Status: AC
  Administered 2020-06-16: 0.5 mg via INTRAVENOUS
  Filled 2020-06-16: qty 2

## 2020-06-16 MED ORDER — FENTANYL BOLUS VIA INFUSION
25.0000 ug | INTRAVENOUS | Status: DC | PRN
  Administered 2020-06-16 – 2020-06-21 (×11): 25 ug via INTRAVENOUS
  Filled 2020-06-16: qty 25

## 2020-06-16 MED ORDER — ADENOSINE 12 MG/4ML IV SOLN
INTRAVENOUS | Status: AC
  Filled 2020-06-16: qty 4

## 2020-06-16 MED ORDER — DEXMEDETOMIDINE HCL IN NACL 400 MCG/100ML IV SOLN
0.4000 ug/kg/h | INTRAVENOUS | Status: DC
  Administered 2020-06-16: 1.2 ug/kg/h via INTRAVENOUS
  Administered 2020-06-16: 0.8 ug/kg/h via INTRAVENOUS
  Administered 2020-06-16 – 2020-06-17 (×4): 1.2 ug/kg/h via INTRAVENOUS
  Administered 2020-06-17: 0.8 ug/kg/h via INTRAVENOUS
  Administered 2020-06-18 – 2020-06-25 (×28): 1.2 ug/kg/h via INTRAVENOUS
  Filled 2020-06-16 (×39): qty 100

## 2020-06-16 MED ORDER — METOPROLOL TARTRATE 25 MG/10 ML ORAL SUSPENSION
50.0000 mg | Freq: Two times a day (BID) | ORAL | Status: DC
  Administered 2020-06-16: 50 mg
  Filled 2020-06-16 (×8): qty 20

## 2020-06-16 MED ORDER — APIXABAN 5 MG PO TABS
5.0000 mg | ORAL_TABLET | Freq: Two times a day (BID) | ORAL | Status: DC
  Administered 2020-06-16 (×2): 5 mg
  Filled 2020-06-16: qty 1

## 2020-06-16 MED ORDER — MIDAZOLAM HCL 2 MG/2ML IJ SOLN
1.0000 mg | INTRAMUSCULAR | Status: DC | PRN
  Administered 2020-06-16 – 2020-06-18 (×6): 1 mg via INTRAVENOUS
  Filled 2020-06-16 (×6): qty 2

## 2020-06-16 MED ORDER — PANTOPRAZOLE SODIUM 40 MG PO PACK
40.0000 mg | PACK | Freq: Every day | ORAL | Status: DC
  Administered 2020-06-16: 40 mg
  Filled 2020-06-16: qty 20

## 2020-06-16 MED ORDER — MIDAZOLAM HCL 2 MG/2ML IJ SOLN
1.0000 mg | INTRAMUSCULAR | Status: AC | PRN
  Administered 2020-06-17 (×3): 1 mg via INTRAVENOUS
  Filled 2020-06-16 (×2): qty 2

## 2020-06-16 MED ORDER — METOPROLOL TARTRATE 5 MG/5ML IV SOLN
5.0000 mg | INTRAVENOUS | Status: DC | PRN
  Administered 2020-06-17 (×2): 5 mg via INTRAVENOUS
  Filled 2020-06-16 (×6): qty 5

## 2020-06-16 MED ORDER — METOPROLOL TARTRATE 5 MG/5ML IV SOLN
INTRAVENOUS | Status: AC
  Administered 2020-06-16: 5 mg via INTRAVENOUS
  Filled 2020-06-16: qty 5

## 2020-06-16 MED ORDER — DIGOXIN 0.25 MG/ML IJ SOLN
0.1250 mg | Freq: Every day | INTRAMUSCULAR | Status: DC
  Administered 2020-06-17 – 2020-06-20 (×4): 0.125 mg via INTRAVENOUS
  Filled 2020-06-16 (×4): qty 2

## 2020-06-16 NOTE — Consult Note (Signed)
Consultation Note Date: 06/16/2020   Patient Name: Virginia [name]  DOB: 01-01-1951  MRN: [id number]  Age / Sex: 69 y.o., female  PCP: Idelle Crouch, MD Referring Physician: Ottie Glazier, MD  Reason for Consultation: Establishing goals of care and Psychosocial/spiritual support  HPI/Patient Profile: 69 y.o. female  with past medical history of COPD with acute on chronic hypoxic/hypercapnic respiratory failure on 3 L home O2, history of MRSA pneumonia, history of reversed, A. fib, HTN, hep C, peripheral neuropathy anxiety/depression, restless leg syndrome, peripheral neuropathy, osteoporosis admitted on 06/06/2020 with acute on chronic hypoxic/hypercapnic respiratory failure and encephalopathy secondary to narcotic use.   Clinical Assessment and Goals of Care: Virginia [name] is lying quietly in bed.  She appears acutely/chronically ill and frail.  She is alert, and will briefly make but not keep eye contact.  She has a fresh tracheostomy, is on PRVC.  I do not believe she can make her basic needs known.  There is no family at bedside at this time.  Virginia [name] has a history of tracheostomy, and although she had declined to be retrached, her son/healthcare surrogate, Virginia [name], requested that she be trached as she was not able to be weaned from the ventilator.  At this point, time for outcomes.  Anticipate need for LTAC versus vent SNF.  Conference with attending and bedside nursing staff related to patient condition, needs, goals of care. PMT to follow.   HCPOA    NEXT OF KIN -son, Virginia [name]    SUMMARY OF RECOMMENDATIONS   Full scope/full code Anticipate need for LTAC versus vent SNF  Code Status/Advance Care Planning:  Full code  Symptom Management:   Per CCM, no additional needs at this time.  Palliative Prophylaxis:   Frequent Pain Assessment, Oral Care and Turn  Reposition  Additional Recommendations (Limitations, Scope, Preferences):  Full Scope Treatment  Psycho-social/Spiritual:   Desire for further Chaplaincy support:no  Additional Recommendations: Caregiving  Support/Resources and ICU Family Guide  Prognosis:   Unable to determine, based on outcomes.  6 months or less would not be surprising based on poor functional status, respiratory chronic illness burden, cardiac involvement, frailty.  Discharge Planning: To be determined, based on outcomes.  LTAC versus vent SNF anticipated      Primary Diagnoses: Present on Admission: . Respiratory failure ([place])   I have reviewed the medical record, interviewed the patient and family, and examined the patient. The following aspects are pertinent.  Past Medical History:  Diagnosis Date  . Anemia   . Anxiety   . Arthritis   . Atrial fibrillation ([place])   . C. difficile colitis 09/2015  . COPD (chronic obstructive pulmonary disease) ([place])   . Depression   . Dyspnea   . Dysrhythmia   . Fibromyalgia   . H/O tracheostomy   . Hep C w/ coma, chronic   . Hypertension   . MRSA pneumonia ([place]) 2017  . On home oxygen therapy    3 L/M   . Osteoporosis   . Peripheral  neuropathy   . RLS (restless legs syndrome)   . S/P percutaneous endoscopic gastrostomy (PEG) tube placement ([place]) 09/2015  . Ventilator associated pneumonia [facility]) 10/2015   [facility] [place] History   Socioeconomic History  . Marital status: Single    Spouse name: Not on file  . Number of children: Not on file  . Years of education: Not on file  . Highest education level: Not on file  Occupational History  . Not on file  Tobacco Use  . Smoking status: Former Smoker    Packs/day: 1.50    Types: Cigarettes    Quit date: 10/05/2015    Years since quitting: 4.7  . Smokeless tobacco: Never Used  Vaping Use  . Vaping Use: Some days  . Last attempt to quit: 10/05/2015  Substance and Sexual Activity   . Alcohol use: No  . Drug use: No  . Sexual activity: Not on file  Other Topics Concern  . Not on file  Social History Narrative  . Not on file   Social Determinants of Health   Financial Resource Strain: Not on file  Food Insecurity: Not on file  Transportation Needs: Not on file  Physical Activity: Not on file  Stress: Not on file  Social Connections: Not on file   Family History  Problem Relation Age of Onset  . Hypertension Mother    Scheduled Meds: . adenosine      . amitriptyline  25 mg Per Tube QHS  . apixaban  5 mg Per Tube BID  . calcium-vitamin D  1 tablet Per Tube Q breakfast  . chlorhexidine gluconate (MEDLINE KIT)  15 mL Mouth Rinse BID  . Chlorhexidine Gluconate Cloth  6 each Topical Daily  . [START ON 06/17/2020] digoxin  0.125 mg Intravenous Daily  . diltiazem  30 mg Per Tube Q6H  . docusate  100 mg Per Tube BID  . gabapentin  300 mg Per Tube TID  . insulin aspart  0-9 Units Subcutaneous Q4H  . ipratropium-albuterol  3 mL Nebulization Q6H  . mouth rinse  15 mL Mouth Rinse 10 times per day  . methylPREDNISolone (SOLU-MEDROL) injection  40 mg Intravenous Q24H  . metoprolol tartrate  50 mg Per Tube BID  . metoprolol tartrate  5 mg Intravenous Once  . midodrine  10 mg Per Tube TID WC  . pantoprazole sodium  40 mg Per Tube QHS  . polyethylene glycol  17 g Per Tube Daily  . QUEtiapine  25 mg Per Tube QHS  . sodium chloride flush  10-40 mL Intracatheter Q12H  . sodium chloride flush  3 mL Intravenous Q12H   Continuous Infusions: . sodium chloride Stopped (06/14/20 0800)  . sodium chloride    . sodium chloride Stopped (06/12/20 1131)  . dexmedetomidine (PRECEDEX) IV infusion 1.2 mcg/kg/hr (06/16/20 1300)  . feeding supplement (VITAL AF 1.2 CAL) 1,000 mL (06/15/20 1649)  . fentaNYL infusion INTRAVENOUS 250 mcg/hr (06/16/20 1300)   PRN Meds:.sodium chloride, acetaminophen (TYLENOL) oral liquid 160 mg/5 mL, diazepam, fentaNYL, ipratropium-albuterol,  metoprolol tartrate, midazolam, midazolam, ondansetron (ZOFRAN) IV, polyethylene glycol, sodium chloride flush, sodium chloride flush Medications Prior to Admission:  Prior to Admission medications   Medication Sig Start Date End Date Taking? Authorizing Provider  albuterol (VENTOLIN HFA) 108 (90 Base) MCG/ACT inhaler Inhale 2 puffs into the lungs every 4 (four) hours as needed for wheezing or shortness of breath.   Yes [provider]  amitriptyline (ELAVIL) 25 MG tablet Take  25 mg by mouth at bedtime.  01/14/19  Yes [provider]  diazepam (VALIUM) 5 MG tablet Take 5 mg by mouth every 8 (eight) hours as needed for anxiety.  10/22/16  Yes [provider]  diltiazem (CARDIZEM CD) 120 MG 24 hr capsule Take 120 mg by mouth daily. 04/29/19  Yes [provider]  estradiol (ESTRACE) 1 MG tablet Take 1 mg by mouth daily. 09/10/16  Yes [provider]  furosemide (LASIX) 20 MG tablet Take 20 mg by mouth daily.  10/01/16  Yes [provider]  gabapentin (NEURONTIN) 300 MG capsule Take 300 mg by mouth 3 (three) times daily. 05/06/20  Yes [provider]  lisinopril (ZESTRIL) 10 MG tablet Take 10 mg by mouth daily.   Yes [provider]  MAGNESIUM-OXIDE 400 (241.3 Mg) MG tablet Take 400 mg by mouth daily.  08/24/16  Yes [provider]  metoprolol tartrate (LOPRESSOR) 50 MG tablet Take 50 mg by mouth 2 (two) times daily. 04/23/20  Yes [provider]  Multiple Vitamin (MULTIVITAMIN) tablet Take 1 tablet by mouth daily.   Yes [provider]  omeprazole (PRILOSEC) 40 MG capsule Take 40 mg by mouth daily. 04/16/19  Yes [provider]  oxyCODONE-acetaminophen (PERCOCET) 10-325 MG tablet Take 1 tablet by mouth 3 (three) times daily. 05/03/20  Yes [provider]  predniSONE (DELTASONE) 5 MG tablet Take 5 mg by mouth daily. 05/23/19  Yes [provider]  QUEtiapine (SEROQUEL) 25 MG tablet Take  25 mg by mouth at bedtime.  10/01/16  Yes [provider]  TRELEGY ELLIPTA 100-62.5-25 MCG/INH AEPB Inhale 1 puff into the lungs daily. 06/04/20  Yes [provider]  apixaban (ELIQUIS) 5 MG TABS tablet Take 1 tablet (5 mg total) by mouth 2 (two) times daily. 11/10/16   Bettey Costa, MD  predniSONE (DELTASONE) 10 MG tablet Take 4 tablets (40 mg total) by mouth daily. 06/04/20   Wouk, Ailene Rud, MD   No Known Allergies Review of Systems  Unable to perform ROS: Intubated    Physical Exam Vitals and nursing note reviewed.  Constitutional:      General: She is not in acute distress.    Appearance: She is ill-appearing.     Interventions: She is intubated.  HENT:     Head: Normocephalic and atraumatic.  Cardiovascular:     Rate and Rhythm: Tachycardia present.  Pulmonary:     Effort: Tachypnea present. She is intubated.     Comments: Tracheostomy Abdominal:     Palpations: Abdomen is soft.  Neurological:     Mental Status: She is alert.     Vital Signs: BP (!) 136/95   Pulse (!) 117   Temp 98.4 F (36.9 C) (Axillary)   Resp (!) 21   Ht 5' 2.01" (1.575 m)   Wt 59.8 kg   SpO2 96%   BMI 24.11 kg/m  Pain Scale: CPOT   Pain Score: 0-No pain   SpO2: SpO2: 96 % O2 Device:SpO2: 96 % O2 Flow Rate: .O2 Flow Rate (L/min): 4 L/min  IO: Intake/output summary:   Intake/Output Summary (Last 24 hours) at 06/16/2020 1345 Last data filed at 06/16/2020 1300 Gross per 24 hour  Intake 753.81 ml  Output 1975 ml  Net -1221.19 ml    LBM: Last BM Date: 06/16/20 Baseline Weight: Weight: 58.9 kg Most recent weight: Weight: 59.8 kg     Palliative Assessment/Data:   Clinical research associate Row Most Recent Value  Intake Tab   Referral Department Hospitalist  Unit at Time of Referral ICU  Palliative Care Primary Diagnosis Pulmonary  Date Notified 06/16/20  Palliative Care Type New Palliative care  Reason for referral Clarify Goals of Care  Date of Admission  06/06/20  Date first seen by Palliative Care 06/16/20  # of days Palliative referral response time 0 Day(s)  # of days IP prior to Palliative referral 10  Clinical Assessment   Palliative Performance Scale Score 20%  Pain Max last 24 hours Not able to report  Pain Min Last 24 hours Not able to report  Dyspnea Max Last 24 Hours Not able to report  Dyspnea Min Last 24 hours Not able to report  Psychosocial & Spiritual Assessment   Palliative Care Outcomes       Time In: 1230 Time Out: 1320 Time Total: 50 minutes  Greater than 50%  of this time was spent counseling and coordinating care related to the above assessment and plan.  Signed by: Drue Novel, NP   Please contact Palliative Medicine Team phone at [phone] for questions and concerns.  For individual provider: See Shea Evans

## 2020-06-16 NOTE — Progress Notes (Signed)
CRITICAL CARE PROGRESS NOTE    Name: Virginia [name] MRN: [id number] DOB: 04-16-1951     LOS: 89   SUBJECTIVE FINDINGS & SIGNIFICANT EVENTS    Patient description:  69 yo female with severe COPD admitted with acute on chronic hypoxic hypercapnic respiratory failure and acute encephalopathy secondary to narcotic use and AECOPD requiring mechanical intubation  12/14 admitted to ICU for COPD exacerbation 12/15 plan for SAT/SBT 12/16 failed SAT/SBT, will try again today 12/16 s/p extubation to BiPAP 12/17 resp status stilltenuous 12/18 severe resp failure high risk for intubation 12/18 Re intubated for severe COPD 12/19 severe resp  Failure, ENT consulted for trach  06/12/20- sedated to RASS-2.  Plan for trache as per previous discussion with critical care team and family. DCd amio gtt today . Weaning solumedrol to 40 daily from BID. Patient on Levophed and metoprolol we will remove arrythmogenic vasopressor today.   12/21- Trache postponed to 12/23 due to anticoagulation. Patient weaned down to 21% will attempt SBT again today- patient failed with severe hypercapnia, plan continues to be for trache. Family updated today. Poke with Judeen Hammans DIL.  12/22- no events overnight. Plan for trache in am.   12/23- pt s/p trache and OGT for nourishment. Patient is able to respond appropriately to verbal communication.  12/24- patient had episodes of SVT today.  She is on scheduled bb and is in NSR now.    Lines/tubes : Airway 7.5 mm (Active)  Secured at (cm) 22 cm 06/12/20 0807  Measured From Lips 06/12/20 0807  Secured Location Left 06/12/20 0807  Secured By [employer] 06/12/20 0807  Tube Holder Repositioned Yes 06/12/20 0807  Prone position No 06/12/20 0807  Cuff Pressure (cm H2O) 26 cm H2O  06/12/20 0807  Site Condition Dry;Cool 06/12/20 0807     CVC Triple Lumen 06/10/20 Left Internal jugular (Active)  Indication for Insertion or Continuance of Line Chronic illness with exacerbations (CF, Sickle Cell, etc.) 06/11/20 2100  Site Assessment Clean;Dry;Intact 06/11/20 2100  Proximal Lumen Status Infusing;Flushed;Blood return noted 06/11/20 2100  Medial Lumen Status Infusing 06/11/20 2100  Distal Lumen Status Infusing;Flushed;Blood return noted 06/11/20 2100  Dressing Type Transparent;Occlusive 06/11/20 2100  Dressing Status Clean;Dry;Intact 06/11/20 2100  Antimicrobial disc in place? Yes 06/11/20 2100  Line Care Connections checked and tightened 06/11/20 2100  Dressing Intervention New dressing 06/10/20 2100  Dressing Change Due 06/17/20 06/11/20 2100     NG/OG Tube Orogastric Center mouth Xray (Active)  Cm Marking at Nare/Corner of Mouth (if applicable) 61 cm 20/10/07 2000  Site Assessment Clean;Dry;Intact 06/11/20 2000  Ongoing Placement Verification No change in cm markings or external length of tube from initial placement;No change in respiratory status;No acute changes, not attributed to clinical condition 06/11/20 2000  Status Infusing tube feed 06/11/20 2000  Drainage Appearance None 06/11/20 1600  Intake (mL) 30 mL 06/11/20 1600     Urethral Catheter Skeet Latch, RN Double-lumen;Latex 14 Fr. (Active)  Indication for Insertion or Continuance of Catheter Therapy based on hourly urine output monitoring and documentation for critical condition (NOT STRICT I&O) 06/12/20 0800  Site Assessment Clean;Intact 06/12/20 0800  Catheter Maintenance Bag below level of bladder;Catheter secured;Drainage bag/tubing not touching floor;Insertion date on drainage bag;No dependent loops;Seal intact 06/12/20 0800  Collection Container Standard drainage bag 06/12/20 0800  Securement Method Securing device (Describe) 06/12/20 0800  Urinary Catheter Interventions (if applicable) Unclamped  06/11/74 0800  Output (mL) 60 mL 06/11/20 1800    Microbiology/Sepsis markers: Results for orders placed  or performed during the hospital encounter of 06/06/20  Blood culture (routine x 2)     Status: None   Collection Time: 06/06/20 12:07 PM   Specimen: BLOOD  Result Value Ref Range Status   Specimen Description BLOOD RIGHT [facility]  Final   Special Requests   Final    BOTTLES DRAWN AEROBIC AND ANAEROBIC Blood Culture adequate volume   Culture   Final    NO GROWTH 5 DAYS Performed at [facility], [address], [place], [place] [address]    Report Status 06/11/2020 FINAL  Final  Blood culture (routine x 2)     Status: None   Collection Time: 06/06/20 12:07 PM   Specimen: BLOOD  Result Value Ref Range Status   Specimen Description BLOOD LEFT AC  Final   Special Requests   Final    BOTTLES DRAWN AEROBIC AND ANAEROBIC Blood Culture adequate volume   Culture   Final    NO GROWTH 5 DAYS Performed at [facility], [address] [place], [place] [address]    Report Status 06/11/2020 FINAL  Final  Resp Panel by RT-PCR (Flu A&B, Covid) Nasopharyngeal Swab     Status: None   Collection Time: 06/06/20 12:07 PM   Specimen: Nasopharyngeal Swab; Nasopharyngeal(NP) swabs in vial transport medium  Result Value Ref Range Status   SARS Coronavirus 2 by RT PCR NEGATIVE NEGATIVE Final    Comment: (NOTE) SARS-CoV-2 target nucleic acids are NOT DETECTED.  The SARS-CoV-2 RNA is generally detectable in upper respiratory specimens during the acute phase of infection. The lowest concentration of SARS-CoV-2 viral copies this assay can detect is 138 copies/mL. A negative result does not preclude SARS-Cov-2 infection and should not be used as the sole basis for treatment or other patient management decisions. A negative result may occur with  improper specimen collection/handling, submission of specimen other than nasopharyngeal swab, presence of viral mutation(s) within the areas  targeted by this assay, and inadequate number of viral copies(<138 copies/mL). A negative result must be combined with clinical observations, patient history, and epidemiological information. The expected result is Negative.  Fact Sheet for Patients:  EntrepreneurPulse.com.au  Fact Sheet for Healthcare Providers:  IncredibleEmployment.be  This test is no t yet approved or cleared by the [place] FDA and  has been authorized for detection and/or diagnosis of SARS-CoV-2 by FDA under an Emergency Use Authorization (EUA). This EUA will remain  in effect (meaning this test can be used) for the duration of the COVID-19 declaration under Section 564(b)(1) of the Act, 21 U.S.C.section 360bbb-3(b)(1), unless the authorization is terminated  or revoked sooner.       Influenza A by PCR NEGATIVE NEGATIVE Final   Influenza B by PCR NEGATIVE NEGATIVE Final    Comment: (NOTE) The Xpert Xpress SARS-CoV-2/FLU/RSV plus assay is intended as an aid in the diagnosis of influenza from Nasopharyngeal swab specimens and should not be used as a sole basis for treatment. Nasal washings and aspirates are unacceptable for Xpert Xpress SARS-CoV-2/FLU/RSV testing.  Fact Sheet for Patients: EntrepreneurPulse.com.au  Fact Sheet for Healthcare Providers: IncredibleEmployment.be  This test is not yet approved or cleared by the [place] FDA and has been authorized for detection and/or diagnosis of SARS-CoV-2 by FDA under an Emergency Use Authorization (EUA). This EUA will remain in effect (meaning this test can be used) for the duration of the COVID-19 declaration under Section 564(b)(1) of the Act, 21 U.S.C. section 360bbb-3(b)(1), unless the authorization is terminated or revoked.  Performed at [facility], [address] [place], [place] [address]   MRSA PCR Screening     Status: None   Collection Time:  06/06/20 11:00 PM   Specimen: Nasopharyngeal  Result Value Ref Range Status   MRSA by PCR NEGATIVE NEGATIVE Final    Comment:        The GeneXpert MRSA Assay (FDA approved for NASAL specimens only), is one component of a comprehensive MRSA colonization surveillance program. It is not intended to diagnose MRSA infection nor to guide or monitor treatment for MRSA infections. Performed at [facility], [address], [place], [place] [address]     Anti-infectives:  Anti-infectives (From admission, onward)   Start     Dose/Rate Route Frequency Ordered Stop   06/07/20 1400  azithromycin (ZITHROMAX) 500 mg in sodium chloride 0.9 % 250 mL IVPB  Status:  Discontinued        500 mg 250 mL/hr over 60 Minutes Intravenous Every 24 hours 06/06/20 2030 06/06/20 2044   06/07/20 1400  azithromycin (ZITHROMAX) 500 mg in sodium chloride 0.9 % 250 mL IVPB  Status:  Discontinued        500 mg 250 mL/hr over 60 Minutes Intravenous Every 24 hours 06/06/20 2044 06/08/20 1031   06/06/20 1415  cefTRIAXone (ROCEPHIN) 2 g in sodium chloride 0.9 % 100 mL IVPB        2 g 200 mL/hr over 30 Minutes Intravenous  Once 06/06/20 1407 06/06/20 1514   06/06/20 1415  azithromycin (ZITHROMAX) 500 mg in sodium chloride 0.9 % 250 mL IVPB        500 mg 250 mL/hr over 60 Minutes Intravenous  Once 06/06/20 1407 06/06/20 1708       Consults:   ENT - tracheostomy  PAST MEDICAL HISTORY   Past Medical History:  Diagnosis Date  . Anemia   . Anxiety   . Arthritis   . Atrial fibrillation ([place])   . C. difficile colitis 09/2015  . COPD (chronic obstructive pulmonary disease) ([place])   . Depression   . Dyspnea   . Dysrhythmia   . Fibromyalgia   . H/O tracheostomy   . Hep C w/ coma, chronic   . Hypertension   . MRSA pneumonia ([place]) 2017  . On home oxygen therapy    3 L/M   . Osteoporosis   . Peripheral neuropathy   . RLS (restless legs syndrome)   . S/P percutaneous endoscopic gastrostomy (PEG)  tube placement ([place]) 09/2015  . Ventilator associated pneumonia [facility]) 10/2015   [facility], [place]     SURGICAL HISTORY   Past Surgical History:  Procedure Laterality Date  . ABDOMINAL HYSTERECTOMY    . BREAST SURGERY Bilateral    Breast Implants  . DILATION AND CURETTAGE OF UTERUS    . REVERSE SHOULDER ARTHROPLASTY Right 03/06/2017   Procedure: REVERSE SHOULDER ARTHROPLASTY;  Surgeon: Corky Mull, MD;  Location: [facility] ORS;  Service: Orthopedics;  Laterality: Right;  . ROTATOR CUFF REPAIR Bilateral   . TRACHEOSTOMY TUBE PLACEMENT N/A 06/15/2020   Procedure: TRACHEOSTOMY;  Surgeon: Clyde Canterbury, MD;  Location: [facility] ORS;  Service: ENT;  Laterality: N/A;     FAMILY HISTORY   Family History  Problem Relation Age of Onset  . Hypertension Mother      SOCIAL HISTORY   Social History   Tobacco Use  . Smoking status: Former Smoker    Packs/day: 1.50    Types: Cigarettes    Quit date: 10/05/2015  Years since quitting: 4.7  . Smokeless tobacco: Never Used  Vaping Use  . Vaping Use: Some days  . Last attempt to quit: 10/05/2015  Substance Use Topics  . Alcohol use: No  . Drug use: No     MEDICATIONS   Current Medication:  Current Facility-Administered Medications:  .  0.9 %  sodium chloride infusion, 250 mL, Intravenous, Continuous, Duffy Bruce, MD, Stopped at 06/14/20 0800 .  0.9 %  sodium chloride infusion, 250 mL, Intravenous, PRN, Mortimer Fries, Kurian, MD .  0.9 %  sodium chloride infusion, 250 mL, Intravenous, Continuous, Kasa, Kurian, MD, Stopped at 06/12/20 1131 .  acetaminophen (TYLENOL) 160 MG/5ML solution 650 mg, 650 mg, Per Tube, Q4H PRN, Flora Lipps, MD .  adenosine (ADENOCARD) 12 MG/4ML injection, , , ,  .  amitriptyline (ELAVIL) tablet 25 mg, 25 mg, Per Tube, QHS, Flora Lipps, MD, 25 mg at 06/15/20 2147 .  calcium-vitamin D (OSCAL WITH D) 500-200 MG-UNIT per tablet 1 tablet, 1 tablet, Per Tube, Q breakfast, Flora Lipps, MD, 1 tablet at 06/16/20  0846 .  chlorhexidine gluconate (MEDLINE KIT) (PERIDEX) 0.12 % solution 15 mL, 15 mL, Mouth Rinse, BID, Kasa, Kurian, MD, 15 mL at 06/16/20 0846 .  Chlorhexidine Gluconate Cloth 2 % PADS 6 each, 6 each, Topical, Daily, Flora Lipps, MD, 6 each at 06/15/20 1030 .  dexmedetomidine (PRECEDEX) 400 MCG/100ML (4 mcg/mL) infusion, 0.4-1.2 mcg/kg/hr, Intravenous, Titrated, Teria Khachatryan, MD, Last Rate: 17.94 mL/hr at 06/16/20 0900, 1.2 mcg/kg/hr at 06/16/20 0900 .  diazepam (VALIUM) tablet 5 mg, 5 mg, Per Tube, Q8H PRN, Rust-Chester, Britton L, NP, 5 mg at 06/15/20 2143 .  digoxin (LANOXIN) 0.25 MG/ML injection 0.125 mg, 0.125 mg, Intravenous, Daily, Lanney Gins, Lashanda Storlie, MD .  digoxin (LANOXIN) 0.25 MG/ML injection 0.5 mg, 0.5 mg, Intravenous, Once, Jacqeline Broers, MD .  diltiazem (CARDIZEM) tablet 30 mg, 30 mg, Per Tube, Q6H, Belue, Alver Sorrow, RPH, 30 mg at 06/16/20 0516 .  docusate (COLACE) 50 MG/5ML liquid 100 mg, 100 mg, Per Tube, BID, Renda Rolls, RPH, 100 mg at 06/13/20 2222 .  feeding supplement (VITAL AF 1.2 CAL) liquid 1,000 mL, 1,000 mL, Per Tube, Continuous, Keitha Kolk, MD, Last Rate: 50 mL/hr at 06/15/20 1649, 1,000 mL at 06/15/20 1649 .  fentaNYL (SUBLIMAZE) bolus via infusion 25 mcg, 25 mcg, Intravenous, Q15 min PRN, Darel Hong D, NP, 25 mcg at 06/16/20 0440 .  fentaNYL 2516mg in NS 2532m(1052mml) infusion-PREMIX, 0-400 mcg/hr, Intravenous, Continuous, Kasa, Kurian, MD, Last Rate: 25 mL/hr at 06/16/20 0900, 250 mcg/hr at 06/16/20 0900 .  gabapentin (NEURONTIN) capsule 300 mg, 300 mg, Per Tube, TID, KasFlora LippsD, 300 mg at 06/15/20 2143 .  insulin aspart (novoLOG) injection 0-9 Units, 0-9 Units, Subcutaneous, Q4H, Rust-Chester, Britton L, NP, 1 Units at 06/15/20 1648 .  ipratropium-albuterol (DUONEB) 0.5-2.5 (3) MG/3ML nebulizer solution 3 mL, 3 mL, Nebulization, Q6H, Blakeney, Dana G, NP, 3 mL at 06/16/20 0201 .  ipratropium-albuterol (DUONEB) 0.5-2.5 (3) MG/3ML nebulizer  solution 3 mL, 3 mL, Nebulization, Q6H PRN, BlaAwilda BillP .  MEDLINE mouth rinse, 15 mL, Mouth Rinse, 10 times per day, KasFlora LippsD, 15 mL at 06/16/20 0516 .  methylPREDNISolone sodium succinate (SOLU-MEDROL) 40 mg/mL injection 40 mg, 40 mg, Intravenous, Q24H, AleLanney Ginsuad, MD, 40 mg at 06/16/20 0846 .  metoprolol tartrate (LOPRESSOR) injection 5 mg, 5 mg, Intravenous, Once, KeeDarel Hong NP .  metoprolol tartrate (LOPRESSOR) injection 5 mg, 5 mg, Intravenous, Q3H PRN, Shiloh Southern,  Natthew Marlatt, MD, 5 mg at 06/16/20 0847 .  metoprolol tartrate (LOPRESSOR) tablet 50 mg, 50 mg, Per Tube, BID, Flora Lipps, MD, 50 mg at 06/15/20 2144 .  midazolam (VERSED) injection 1 mg, 1 mg, Intravenous, Q15 min PRN, Darel Hong D, NP .  midazolam (VERSED) injection 1 mg, 1 mg, Intravenous, Q2H PRN, Darel Hong D, NP, 1 mg at 06/16/20 0439 .  midodrine (PROAMATINE) tablet 10 mg, 10 mg, Per Tube, TID WC, Theodora Lalanne, MD, 10 mg at 06/16/20 0846 .  ondansetron (ZOFRAN) injection 4 mg, 4 mg, Intravenous, Q6H PRN, Mortimer Fries, Kurian, MD .  pantoprazole sodium (PROTONIX) 40 mg/20 mL oral suspension 40 mg, 40 mg, Per Tube, QHS, Chappell, Alex B, RPH .  polyethylene glycol (MIRALAX / GLYCOLAX) packet 17 g, 17 g, Per Tube, Daily PRN, Mortimer Fries, Kurian, MD .  polyethylene glycol (MIRALAX / GLYCOLAX) packet 17 g, 17 g, Per Tube, Daily, Flora Lipps, MD, 17 g at 06/12/20 0919 .  QUEtiapine (SEROQUEL) tablet 25 mg, 25 mg, Per Tube, QHS, Flora Lipps, MD, 25 mg at 06/15/20 2143 .  sodium chloride flush (NS) 0.9 % injection 10-40 mL, 10-40 mL, Intracatheter, Q12H, Kasa, Kurian, MD, 10 mL at 06/15/20 2144 .  sodium chloride flush (NS) 0.9 % injection 10-40 mL, 10-40 mL, Intracatheter, PRN, Mortimer Fries, Kurian, MD .  sodium chloride flush (NS) 0.9 % injection 3 mL, 3 mL, Intravenous, Q12H, Kasa, Kurian, MD, 3 mL at 06/15/20 2145 .  sodium chloride flush (NS) 0.9 % injection 3 mL, 3 mL, Intravenous, PRN, Flora Lipps,  MD    ALLERGIES   Patient has no known allergies.    REVIEW OF SYSTEMS    Unable to obtain on sedation and mechanical ventilation  PHYSICAL EXAMINATION   Vital Signs: Temp:  [98.5 F (36.9 C)-99.7 F (37.6 C)] 98.5 F (36.9 C) (12/24 0800) Pulse Rate:  [77-198] 91 (12/24 0900) Resp:  [13-28] 18 (12/24 0900) BP: (119-186)/(75-114) 119/85 (12/24 0900) SpO2:  [92 %-99 %] 97 % (12/24 0900) FiO2 (%):  [35 %] 35 % (12/24 0836) Weight:  [59.8 kg] 59.8 kg (12/24 0500)  GENERAL:age appropriate sedated on MV HEAD: Normocephalic, atraumatic.  EYES: Pupils equal, round, reactive to light.  No scleral icterus.  MOUTH: Moist mucosal membrane. NECK: Supple. No thyromegaly. No nodules. No JVD.  PULMONARY: mild rhonchi CARDIOVASCULAR: S1 and S2. Regular rate and rhythm. No murmurs, rubs, or gallops.  GASTROINTESTINAL: Soft, nontender, non-distended. No masses. Positive bowel sounds. No hepatosplenomegaly.  MUSCULOSKELETAL: No swelling, clubbing, or edema.  NEUROLOGIC: GCS3T SKIN:intact,warm,dry   PERTINENT DATA     Infusions: . sodium chloride Stopped (06/14/20 0800)  . sodium chloride    . sodium chloride Stopped (06/12/20 1131)  . dexmedetomidine (PRECEDEX) IV infusion 1.2 mcg/kg/hr (06/16/20 0900)  . feeding supplement (VITAL AF 1.2 CAL) 1,000 mL (06/15/20 1649)  . fentaNYL infusion INTRAVENOUS 250 mcg/hr (06/16/20 0900)   Scheduled Medications: . adenosine      . amitriptyline  25 mg Per Tube QHS  . calcium-vitamin D  1 tablet Per Tube Q breakfast  . chlorhexidine gluconate (MEDLINE KIT)  15 mL Mouth Rinse BID  . Chlorhexidine Gluconate Cloth  6 each Topical Daily  . digoxin  0.125 mg Intravenous Daily  . digoxin  0.5 mg Intravenous Once  . diltiazem  30 mg Per Tube Q6H  . docusate  100 mg Per Tube BID  . gabapentin  300 mg Per Tube TID  . insulin aspart  0-9 Units Subcutaneous Q4H  .  ipratropium-albuterol  3 mL Nebulization Q6H  . mouth rinse  15 mL Mouth Rinse  10 times per day  . methylPREDNISolone (SOLU-MEDROL) injection  40 mg Intravenous Q24H  . metoprolol tartrate  5 mg Intravenous Once  . metoprolol tartrate  50 mg Per Tube BID  . midodrine  10 mg Per Tube TID WC  . pantoprazole sodium  40 mg Per Tube QHS  . polyethylene glycol  17 g Per Tube Daily  . QUEtiapine  25 mg Per Tube QHS  . sodium chloride flush  10-40 mL Intracatheter Q12H  . sodium chloride flush  3 mL Intravenous Q12H   PRN Medications: sodium chloride, acetaminophen (TYLENOL) oral liquid 160 mg/5 mL, diazepam, fentaNYL, ipratropium-albuterol, metoprolol tartrate, midazolam, midazolam, ondansetron (ZOFRAN) IV, polyethylene glycol, sodium chloride flush, sodium chloride flush Hemodynamic parameters:   Intake/Output: 12/23 0701 - 12/24 0700 In: 794.3 [I.V.:685.1; NG/GT:109.2] Out: 2461 [Urine:2360; Stool:100; Blood:1]  Ventilator  Settings: Vent Mode: PRVC FiO2 (%):  [35 %] 35 % Set Rate:  [14 bmp] 14 bmp Vt Set:  [420 mL] 420 mL PEEP:  [5 cmH20] 5 cmH20 Plateau Pressure:  [9 cmH20-15 cmH20] 9 cmH20   LAB RESULTS:  Basic Metabolic Panel: Recent Labs  Lab 06/12/20 0442 06/13/20 0606 06/14/20 0135 06/15/20 0315 06/16/20 0440  NA 138 140 141 144 146*  K 4.6 4.6 5.0 4.9 4.1  CL 100 103 103 102 100  CO2 31 32 33* 38* 40*  GLUCOSE 183* 114* 113* 102* 141*  BUN 36* 40* 43* 48* 35*  CREATININE 0.93 0.92 0.84 0.65 0.57  CALCIUM 8.9 8.9 8.8* 9.0 8.7*  MG 2.2 2.1 2.2 2.1 2.0  PHOS 4.0 4.1 4.1 3.1 2.9   Liver Function Tests: Recent Labs  Lab 06/10/20 0422  AST 23  ALT 25  ALKPHOS 13*  BILITOT 0.5  PROT 5.5*  ALBUMIN 3.0*   No results for input(s): LIPASE, AMYLASE in the last 168 hours. No results for input(s): AMMONIA in the last 168 hours. CBC: Recent Labs  Lab 06/12/20 0442 06/13/20 0820 06/14/20 0135 06/15/20 0315 06/16/20 0440  WBC 13.3* 22.1* 23.3* 20.9* 19.3*  HGB 8.4* 8.4* 8.7* 7.9* 7.9*  HCT 27.0* 28.6* 28.5* 25.9* 25.4*  MCV 98.9 104.0*  102.9* 102.0* 102.0*  PLT 348 313 319 280 256   Cardiac Enzymes: No results for input(s): CKTOTAL, CKMB, CKMBINDEX, TROPONINI in the last 168 hours. BNP: Invalid input(s): POCBNP CBG: Recent Labs  Lab 06/15/20 1934 06/15/20 2334 06/15/20 2339 06/16/20 0341 06/16/20 0714  GLUCAP 117* 117* 118* 118* 116*       IMAGING RESULTS:  Imaging: DG Abd Portable 1V  Result Date: 06/15/2020 CLINICAL DATA:  Feeding tube placement EXAM: PORTABLE ABDOMEN - 1 VIEW COMPARISON:  June 12, 2020 FINDINGS: Nasogastric tube has been removed. There is a feeding tube with tip in the stomach. Guidewire remains in place. No bowel dilatation or air-fluid level to suggest bowel obstruction. No free air. Lung bases clear. IMPRESSION: Feeding tube tip in stomach. No bowel obstruction or free air apparent. Electronically Signed   By: Lowella Grip III M.D.   On: 06/15/2020 13:00   _0 @ DG Abd Portable 1V  Result Date: 06/15/2020 CLINICAL DATA:  Feeding tube placement EXAM: PORTABLE ABDOMEN - 1 VIEW COMPARISON:  June 12, 2020 FINDINGS: Nasogastric tube has been removed. There is a feeding tube with tip in the stomach. Guidewire remains in place. No bowel dilatation or air-fluid level to suggest bowel obstruction. No free air. Lung bases  clear. IMPRESSION: Feeding tube tip in stomach. No bowel obstruction or free air apparent. Electronically Signed   By: Lowella Grip III M.D.   On: 06/15/2020 13:00     ASSESSMENT AND PLAN    -Multidisciplinary rounds held today  Acute Hypoxic Respiratory Failure -FiO2 is at 30% today -AECOPD  -continue Full MV support -continue Bronchodilator Therapy -Wean Fio2 and PEEP as tolerated -will perform SAT/SBT when respiratory parameters are met -s/p KUB - OGT advanced as indicated by radiologist -s/p Tracheostomy   Acute on chronic diastolic CHF  - patient is on levophed and metoprolol - will get rid of levophed and use OGT midodrine for  vasopressor support   GI/Nutrition GI PROPHYLAXIS as indicated DIET-->TF's as tolerated Constipation protocol as indicated  ENDO - ICU hypoglycemic\Hyperglycemia protocol -check FSBS per protocol   ELECTROLYTES -follow labs as needed -replace as needed -pharmacy consultation   DVT/GI PRX ordered -SCDs  TRANSFUSIONS AS NEEDED MONITOR FSBS ASSESS the need for LABS as needed   Critical care provider statement:    Critical care time (minutes):  33   Critical care time was exclusive of:  Separately billable procedures and treating other patients   Critical care was necessary to treat or prevent imminent or life-threatening deterioration of the following conditions:  acute hypoxemic respiratory failure, AECOPD, multiple comorbid conditions   Critical care was time spent personally by me on the following activities:  Development of treatment plan with patient or surrogate, discussions with consultants, evaluation of patient's response to treatment, examination of patient, obtaining history from patient or surrogate, ordering and performing treatments and interventions, ordering and review of laboratory studies and re-evaluation of patient's condition.  I assumed direction of critical care for this patient from another provider in my specialty: no    This document was prepared using Dragon voice recognition software and may include unintentional dictation errors.    Ottie Glazier, M.D.  Division of [place]

## 2020-06-16 NOTE — Progress Notes (Addendum)
@   1957, pt went into SVT (HR 190s-200s). APP called to bedside. Run of SVT only last for a couple minutes. Pt converted back to ST independently. 5 of Metoprolol and Fentanyl bolus administered upon APP's request. Pt remains RAAS 0 to -1. Pt following commands, denies pain, nodding appropriately. Fentanyl gtt remains on to promote pt comfort post surgery. Foley and rectal tube remain in place. Turn q2. Pt in no acute distress @ this time. Will continue to monitor.   This AM, pt endorsed pain in neck area near fresh trach. APP notified, PRN Fentanyl bolus and Versed ordered. Pt appears.   Small amount of bloody drainage around trach site noted @ 0500 this AM. Betadine dressing remains in place.

## 2020-06-16 NOTE — Plan of Care (Signed)

## 2020-06-16 NOTE — Progress Notes (Signed)
PHARMACIST - PHYSICIAN COMMUNICATION  CONCERNING: IV to Oral Route Change Policy  RECOMMENDATION: This patient is receiving pantoprazole by the intravenous route.  Based on criteria approved by the Pharmacy and Therapeutics Committee, the intravenous medication(s) is/are being converted to the equivalent oral dose form(s).   DESCRIPTION: These criteria include:  The patient is eating (either orally or via tube) and/or has been taking other orally administered medications for a least 24 hours  The patient has no evidence of active gastrointestinal bleeding or impaired GI absorption (gastrectomy, short bowel, patient on TNA or NPO).  If you have questions about this conversion, please contact the Pharmacy Department  [x]   [phone] )  [facility]  [facility], [facility] 06/16/2020 9:15 AM

## 2020-06-16 NOTE — Consult Note (Addendum)
PHARMACY CONSULT NOTE  Pharmacy Consult for Electrolyte Monitoring and Replacement   Recent Labs: Potassium (mmol/L)  Date Value  06/16/2020 4.1   Magnesium (mg/dL)  Date Value  15/52/0802 2.0   Calcium (mg/dL)  Date Value  23/36/1224 8.7 (L)   Albumin (g/dL)  Date Value  49/75/3005 3.0 (L)   Phosphorus (mg/dL)  Date Value  04/25/1116 2.9   Sodium (mmol/L)  Date Value  06/16/2020 146 (H)     Assessment: 69 yo female admitted with acute on chronic hypoxic hypercapnic respiratory failure and acute encephalopathy secondary to narcotic use, AECOPD requiring mechanical intubation, and acute kidney injury/failure.    Goal of Therapy:  K > 4, Mg > 2, all other electrolytes within normal limits  Plan:   Na trending up, 146 today. Continue to monitor  No replacement needed  Continue to follow along  Virginia [name] 06/16/2020 3:37 PM

## 2020-06-17 LAB — BASIC METABOLIC PANEL
Anion gap: 5 (ref 5–15)
BUN: 31 mg/dL — ABNORMAL HIGH (ref 8–23)
CO2: 41 mmol/L — ABNORMAL HIGH (ref 22–32)
Calcium: 8.7 mg/dL — ABNORMAL LOW (ref 8.9–10.3)
Chloride: 99 mmol/L (ref 98–111)
Creatinine, Ser: 0.44 mg/dL (ref 0.44–1.00)
GFR, Estimated: >60 mL/min (ref >60)
Glucose, Bld: 158 mg/dL — ABNORMAL HIGH (ref 70–99)
Potassium: 4.3 mmol/L (ref 3.5–5.1)
Sodium: 145 mmol/L (ref 135–145)

## 2020-06-17 LAB — CBC
HCT: 25.1 % — ABNORMAL LOW (ref 36.0–46.0)
Hemoglobin: 7.6 g/dL — ABNORMAL LOW (ref 12.0–15.0)
MCH: 31.1 pg (ref 26.0–34.0)
MCHC: 30.3 g/dL (ref 30.0–36.0)
MCV: 102.9 fL — ABNORMAL HIGH (ref 80.0–100.0)
Platelets: 245 10*3/uL (ref 150–400)
RBC: 2.44 MIL/uL — ABNORMAL LOW (ref 3.87–5.11)
RDW: 14.8 % (ref 11.5–15.5)
WBC: 14.1 10*3/uL — ABNORMAL HIGH (ref 4.0–10.5)
nRBC: 0.1 % (ref 0.0–0.2)

## 2020-06-17 LAB — GLUCOSE, CAPILLARY
Glucose-Capillary: 141 mg/dL — ABNORMAL HIGH (ref 70–99)
Glucose-Capillary: 131 mg/dL — ABNORMAL HIGH (ref 70–99)
Glucose-Capillary: 142 mg/dL — ABNORMAL HIGH (ref 70–99)
Glucose-Capillary: 144 mg/dL — ABNORMAL HIGH (ref 70–99)
Glucose-Capillary: 138 mg/dL — ABNORMAL HIGH (ref 70–99)

## 2020-06-17 LAB — MAGNESIUM: Magnesium: 2.3 mg/dL (ref 1.7–2.4)

## 2020-06-17 LAB — PHOSPHORUS: Phosphorus: 3 mg/dL (ref 2.5–4.6)

## 2020-06-17 NOTE — Progress Notes (Signed)
CRITICAL CARE PROGRESS NOTE    Name: Virginia [name] MRN: [id number] DOB: September 13, 1950     LOS: 60   SUBJECTIVE FINDINGS & SIGNIFICANT EVENTS    Patient description:  69 yo female with severe COPD admitted with acute on chronic hypoxic hypercapnic respiratory failure and acute encephalopathy secondary to narcotic use and AECOPD requiring mechanical intubation  12/14 admitted to ICU for COPD exacerbation 12/15 plan for SAT/SBT 12/16 failed SAT/SBT, will try again today 12/16 s/p extubation to BiPAP 12/17 resp status stilltenuous 12/18 severe resp failure high risk for intubation 12/18 Re intubated for severe COPD 12/19 severe resp  Failure, ENT consulted for trach  06/12/20- sedated to RASS-2.  Plan for trache as per previous discussion with critical care team and family. DCd amio gtt today . Weaning solumedrol to 40 daily from BID. Patient on Levophed and metoprolol we will remove arrythmogenic vasopressor today.   12/21- Trache postponed to 12/23 due to anticoagulation. Patient weaned down to 21% will attempt SBT again today- patient failed with severe hypercapnia, plan continues to be for trache. Family updated today. Poke with Judeen Hammans DIL.  12/22- no events overnight. Plan for trache in am.   12/23- pt s/p trache and OGT for nourishment. Patient is able to respond appropriately to verbal communication.  12/24- patient had episodes of SVT today.  She is on scheduled bb and is in NSR now.   06/17/20- patient unchanged without overnight events. Weaning FIo2 on ventilator today.   Lines/tubes : Airway 7.5 mm (Active)  Secured at (cm) 22 cm 06/12/20 0807  Measured From Lips 06/12/20 0807  Secured Location Left 06/12/20 0807  Secured By [employer] 06/12/20 0807  Tube Holder Repositioned  Yes 06/12/20 0807  Prone position No 06/12/20 0807  Cuff Pressure (cm H2O) 26 cm H2O 06/12/20 0807  Site Condition Dry;Cool 06/12/20 0807     CVC Triple Lumen 06/10/20 Left Internal jugular (Active)  Indication for Insertion or Continuance of Line Chronic illness with exacerbations (CF, Sickle Cell, etc.) 06/11/20 2100  Site Assessment Clean;Dry;Intact 06/11/20 2100  Proximal Lumen Status Infusing;Flushed;Blood return noted 06/11/20 2100  Medial Lumen Status Infusing 06/11/20 2100  Distal Lumen Status Infusing;Flushed;Blood return noted 06/11/20 2100  Dressing Type Transparent;Occlusive 06/11/20 2100  Dressing Status Clean;Dry;Intact 06/11/20 2100  Antimicrobial disc in place? Yes 06/11/20 2100  Line Care Connections checked and tightened 06/11/20 2100  Dressing Intervention New dressing 06/10/20 2100  Dressing Change Due 06/17/20 06/11/20 2100     NG/OG Tube Orogastric Center mouth Xray (Active)  Cm Marking at Nare/Corner of Mouth (if applicable) 61 cm 94/85/46 2000  Site Assessment Clean;Dry;Intact 06/11/20 2000  Ongoing Placement Verification No change in cm markings or external length of tube from initial placement;No change in respiratory status;No acute changes, not attributed to clinical condition 06/11/20 2000  Status Infusing tube feed 06/11/20 2000  Drainage Appearance None 06/11/20 1600  Intake (mL) 30 mL 06/11/20 1600     Urethral Catheter Skeet Latch, RN Double-lumen;Latex 14 Fr. (Active)  Indication for Insertion or Continuance of Catheter Therapy based on hourly urine output monitoring and documentation for critical condition (NOT STRICT I&O) 06/12/20 0800  Site Assessment Clean;Intact 06/12/20 0800  Catheter Maintenance Bag below level of bladder;Catheter secured;Drainage bag/tubing not touching floor;Insertion date on drainage bag;No dependent loops;Seal intact 06/12/20 0800  Collection Container Standard drainage bag 06/12/20 0800  Securement Method Securing  device (Describe) 06/12/20 0800  Urinary Catheter Interventions (if applicable) Unclamped 27/03/50 0800  Output (mL) 60  mL 06/11/20 1800    Microbiology/Sepsis markers: Results for orders placed or performed during the hospital encounter of 06/06/20  Blood culture (routine x 2)     Status: None   Collection Time: 06/06/20 12:07 PM   Specimen: BLOOD  Result Value Ref Range Status   Specimen Description BLOOD RIGHT [facility]  Final   Special Requests   Final    BOTTLES DRAWN AEROBIC AND ANAEROBIC Blood Culture adequate volume   Culture   Final    NO GROWTH 5 DAYS Performed at [facility], [address] [place], [place] [address]    Report Status 06/11/2020 FINAL  Final  Blood culture (routine x 2)     Status: None   Collection Time: 06/06/20 12:07 PM   Specimen: BLOOD  Result Value Ref Range Status   Specimen Description BLOOD LEFT AC  Final   Special Requests   Final    BOTTLES DRAWN AEROBIC AND ANAEROBIC Blood Culture adequate volume   Culture   Final    NO GROWTH 5 DAYS Performed at [facility], [address] [place],  [address]    Report Status 06/11/2020 FINAL  Final  Resp Panel by RT-PCR (Flu A&B, Covid) Nasopharyngeal Swab     Status: None   Collection Time: 06/06/20 12:07 PM   Specimen: Nasopharyngeal Swab; Nasopharyngeal(NP) swabs in vial transport medium  Result Value Ref Range Status   SARS Coronavirus 2 by RT PCR NEGATIVE NEGATIVE Final    Comment: (NOTE) SARS-CoV-2 target nucleic acids are NOT DETECTED.  The SARS-CoV-2 RNA is generally detectable in upper respiratory specimens during the acute phase of infection. The lowest concentration of SARS-CoV-2 viral copies this assay can detect is 138 copies/mL. A negative result does not preclude SARS-Cov-2 infection and should not be used as the sole basis for treatment or other patient management decisions. A negative result may occur with  improper specimen collection/handling, submission  of specimen other than nasopharyngeal swab, presence of viral mutation(s) within the areas targeted by this assay, and inadequate number of viral copies(<138 copies/mL). A negative result must be combined with clinical observations, patient history, and epidemiological information. The expected result is Negative.  Fact Sheet for Patients:  EntrepreneurPulse.com.au  Fact Sheet for Healthcare Providers:  IncredibleEmployment.be  This test is no t yet approved or cleared by the [place] FDA and  has been authorized for detection and/or diagnosis of SARS-CoV-2 by FDA under an Emergency Use Authorization (EUA). This EUA will remain  in effect (meaning this test can be used) for the duration of the COVID-19 declaration under Section 564(b)(1) of the Act, 21 U.S.C.section 360bbb-3(b)(1), unless the authorization is terminated  or revoked sooner.       Influenza A by PCR NEGATIVE NEGATIVE Final   Influenza B by PCR NEGATIVE NEGATIVE Final    Comment: (NOTE) The Xpert Xpress SARS-CoV-2/FLU/RSV plus assay is intended as an aid in the diagnosis of influenza from Nasopharyngeal swab specimens and should not be used as a sole basis for treatment. Nasal washings and aspirates are unacceptable for Xpert Xpress SARS-CoV-2/FLU/RSV testing.  Fact Sheet for Patients: EntrepreneurPulse.com.au  Fact Sheet for Healthcare Providers: IncredibleEmployment.be  This test is not yet approved or cleared by the [place] FDA and has been authorized for detection and/or diagnosis of SARS-CoV-2 by FDA under an Emergency Use Authorization (EUA). This EUA will remain in effect (meaning this test can be used) for the duration of the COVID-19 declaration under Section 564(b)(1) of the  Act, 21 U.S.C. section 360bbb-3(b)(1), unless the authorization is terminated or revoked.  Performed at [facility], [address] [place], [place] [address]   MRSA PCR Screening     Status: None   Collection Time: 06/06/20 11:00 PM   Specimen: Nasopharyngeal  Result Value Ref Range Status   MRSA by PCR NEGATIVE NEGATIVE Final    Comment:        The GeneXpert MRSA Assay (FDA approved for NASAL specimens only), is one component of a comprehensive MRSA colonization surveillance program. It is not intended to diagnose MRSA infection nor to guide or monitor treatment for MRSA infections. Performed at [facility], [address], [place], [place] [address]     Anti-infectives:  Anti-infectives (From admission, onward)   Start     Dose/Rate Route Frequency Ordered Stop   06/07/20 1400  azithromycin (ZITHROMAX) 500 mg in sodium chloride 0.9 % 250 mL IVPB  Status:  Discontinued        500 mg 250 mL/hr over 60 Minutes Intravenous Every 24 hours 06/06/20 2030 06/06/20 2044   06/07/20 1400  azithromycin (ZITHROMAX) 500 mg in sodium chloride 0.9 % 250 mL IVPB  Status:  Discontinued        500 mg 250 mL/hr over 60 Minutes Intravenous Every 24 hours 06/06/20 2044 06/08/20 1031   06/06/20 1415  cefTRIAXone (ROCEPHIN) 2 g in sodium chloride 0.9 % 100 mL IVPB        2 g 200 mL/hr over 30 Minutes Intravenous  Once 06/06/20 1407 06/06/20 1514   06/06/20 1415  azithromycin (ZITHROMAX) 500 mg in sodium chloride 0.9 % 250 mL IVPB        500 mg 250 mL/hr over 60 Minutes Intravenous  Once 06/06/20 1407 06/06/20 1708       Consults:   ENT - tracheostomy  PAST MEDICAL HISTORY   Past Medical History:  Diagnosis Date  . Anemia   . Anxiety   . Arthritis   . Atrial fibrillation ([place])   . C. difficile colitis 09/2015  . COPD (chronic obstructive pulmonary disease) ([place])   . Depression   . Dyspnea   . Dysrhythmia   . Fibromyalgia   . H/O tracheostomy   . Hep C w/ coma, chronic   . Hypertension   . MRSA pneumonia ([place] [address]) 2017  . On home oxygen therapy    3 L/M   . Osteoporosis   . Peripheral neuropathy    . RLS (restless legs syndrome)   . S/P percutaneous endoscopic gastrostomy (PEG) tube placement ([place]) 09/2015  . Ventilator associated pneumonia [facility]) 10/2015   [facility], [place]     SURGICAL HISTORY   Past Surgical History:  Procedure Laterality Date  . ABDOMINAL HYSTERECTOMY    . BREAST SURGERY Bilateral    Breast Implants  . DILATION AND CURETTAGE OF UTERUS    . REVERSE SHOULDER ARTHROPLASTY Right 03/06/2017   Procedure: REVERSE SHOULDER ARTHROPLASTY;  Surgeon: Corky Mull, MD;  Location: [facility] ORS;  Service: Orthopedics;  Laterality: Right;  . ROTATOR CUFF REPAIR Bilateral   . TRACHEOSTOMY TUBE PLACEMENT N/A 06/15/2020   Procedure: TRACHEOSTOMY;  Surgeon: Clyde Canterbury, MD;  Location: [facility] ORS;  Service: ENT;  Laterality: N/A;     FAMILY HISTORY   Family History  Problem Relation Age of Onset  . Hypertension Mother      SOCIAL HISTORY   Social History   Tobacco Use  . Smoking status: Former Smoker  Packs/day: 1.50    Types: Cigarettes    Quit date: 10/05/2015    Years since quitting: 4.7  . Smokeless tobacco: Never Used  Vaping Use  . Vaping Use: Some days  . Last attempt to quit: 10/05/2015  Substance Use Topics  . Alcohol use: No  . Drug use: No     MEDICATIONS   Current Medication:  Current Facility-Administered Medications:  .  0.9 %  sodium chloride infusion, 250 mL, Intravenous, Continuous, Duffy Bruce, MD, Stopped at 06/14/20 0800 .  0.9 %  sodium chloride infusion, 250 mL, Intravenous, PRN, Mortimer Fries, Kurian, MD .  0.9 %  sodium chloride infusion, 250 mL, Intravenous, Continuous, Kasa, Kurian, MD, Stopped at 06/12/20 1131 .  acetaminophen (TYLENOL) 160 MG/5ML solution 650 mg, 650 mg, Per Tube, Q4H PRN, Flora Lipps, MD .  amitriptyline (ELAVIL) tablet 25 mg, 25 mg, Per Tube, QHS, Kasa, Kurian, MD, 25 mg at 06/16/20 2120 .  apixaban (ELIQUIS) tablet 5 mg, 5 mg, Per Tube, BID, Ottie Glazier, MD, 5 mg at 06/16/20 2119 .   calcium-vitamin D (OSCAL WITH D) 500-200 MG-UNIT per tablet 1 tablet, 1 tablet, Per Tube, Q breakfast, Flora Lipps, MD, 1 tablet at 06/16/20 0846 .  chlorhexidine gluconate (MEDLINE KIT) (PERIDEX) 0.12 % solution 15 mL, 15 mL, Mouth Rinse, BID, Kasa, Kurian, MD, 15 mL at 06/17/20 0845 .  Chlorhexidine Gluconate Cloth 2 % PADS 6 each, 6 each, Topical, Daily, Flora Lipps, MD, 6 each at 06/16/20 1238 .  dexmedetomidine (PRECEDEX) 400 MCG/100ML (4 mcg/mL) infusion, 0.4-1.2 mcg/kg/hr, Intravenous, Titrated, Amarien Carne, MD, Last Rate: 17.94 mL/hr at 06/17/20 1031, 1.2 mcg/kg/hr at 06/17/20 1031 .  diazepam (VALIUM) tablet 5 mg, 5 mg, Per Tube, Q8H PRN, Rust-Chester, [place]on L, NP, 5 mg at 06/15/20 2143 .  digoxin (LANOXIN) 0.25 MG/ML injection 0.125 mg, 0.125 mg, Intravenous, Daily, Lanney Gins, Marlie Kuennen, MD, 0.125 mg at 06/17/20 1027 .  diltiazem (CARDIZEM) tablet 30 mg, 30 mg, Per Tube, Q6H, Belue, Alver Sorrow, RPH, 30 mg at 06/17/20 0540 .  docusate (COLACE) 50 MG/5ML liquid 100 mg, 100 mg, Per Tube, BID, Renda Rolls, RPH, 100 mg at 06/16/20 2120 .  feeding supplement (VITAL AF 1.2 CAL) liquid 1,000 mL, 1,000 mL, Per Tube, Continuous, Rashaun Wichert, MD, Last Rate: 50 mL/hr at 06/15/20 1649, 1,000 mL at 06/15/20 1649 .  fentaNYL (SUBLIMAZE) bolus via infusion 25 mcg, 25 mcg, Intravenous, Q15 min PRN, Darel Hong D, NP, 25 mcg at 06/17/20 0655 .  fentaNYL 2593mg in NS 2535m(1039mml) infusion-PREMIX, 0-400 mcg/hr, Intravenous, Continuous, Kasa, Kurian, MD, Last Rate: 25 mL/hr at 06/17/20 0451, 250 mcg/hr at 06/17/20 0451 .  gabapentin (NEURONTIN) capsule 300 mg, 300 mg, Per Tube, TID, KasFlora LippsD, 300 mg at 06/16/20 2119 .  insulin aspart (novoLOG) injection 0-9 Units, 0-9 Units, Subcutaneous, Q4H, Rust-Chester, [place]on L, NP, 1 Units at 06/17/20 0450 .  ipratropium-albuterol (DUONEB) 0.5-2.5 (3) MG/3ML nebulizer solution 3 mL, 3 mL, Nebulization, Q6H, Blakeney, Dana G, NP, 3 mL at 06/17/20  0938 .  ipratropium-albuterol (DUONEB) 0.5-2.5 (3) MG/3ML nebulizer solution 3 mL, 3 mL, Nebulization, Q6H PRN, BlaAwilda BillP .  MEDLINE mouth rinse, 15 mL, Mouth Rinse, 10 times per day, KasFlora LippsD, 15 mL at 06/17/20 1059 .  methylPREDNISolone sodium succinate (SOLU-MEDROL) 40 mg/mL injection 40 mg, 40 mg, Intravenous, Q24H, Tymere Depuy, MD, 40 mg at 06/17/20 1030 .  metoprolol tartrate (LOPRESSOR) 25 mg/10 mL oral suspension 50 mg, 50 mg, Per Tube,  BID, Benita Gutter, [place], 50 mg at 06/16/20 2120 .  metoprolol tartrate (LOPRESSOR) injection 5 mg, 5 mg, Intravenous, Q3H PRN, Ottie Glazier, MD, 5 mg at 06/16/20 0847 .  midazolam (VERSED) injection 1 mg, 1 mg, Intravenous, Q15 min PRN, Darel Hong D, NP, 1 mg at 06/17/20 1104 .  midazolam (VERSED) injection 1 mg, 1 mg, Intravenous, Q2H PRN, Darel Hong D, NP, 1 mg at 06/17/20 1037 .  midodrine (PROAMATINE) tablet 10 mg, 10 mg, Per Tube, TID WC, , , MD, 10 mg at 06/16/20 1706 .  ondansetron (ZOFRAN) injection 4 mg, 4 mg, Intravenous, Q6H PRN, Mortimer Fries, Kurian, MD .  pantoprazole sodium (PROTONIX) 40 mg/20 mL oral suspension 40 mg, 40 mg, Per Tube, QHS, Benita Gutter, RPH, 40 mg at 06/16/20 2122 .  polyethylene glycol (MIRALAX / GLYCOLAX) packet 17 g, 17 g, Per Tube, Daily PRN, Mortimer Fries, Kurian, MD .  polyethylene glycol (MIRALAX / GLYCOLAX) packet 17 g, 17 g, Per Tube, Daily, Flora Lipps, MD, 17 g at 06/16/20 1239 .  QUEtiapine (SEROQUEL) tablet 25 mg, 25 mg, Per Tube, QHS, Flora Lipps, MD, 25 mg at 06/16/20 2119 .  sodium chloride flush (NS) 0.9 % injection 10-40 mL, 10-40 mL, Intracatheter, Q12H, Kasa, Kurian, MD, 10 mL at 06/17/20 1100 .  sodium chloride flush (NS) 0.9 % injection 10-40 mL, 10-40 mL, Intracatheter, PRN, Mortimer Fries, Kurian, MD .  sodium chloride flush (NS) 0.9 % injection 3 mL, 3 mL, Intravenous, Q12H, Kasa, Kurian, MD, 3 mL at 06/17/20 1059 .  sodium chloride flush (NS) 0.9 % injection 3 mL, 3 mL,  Intravenous, PRN, Flora Lipps, MD    ALLERGIES   Patient has no known allergies.    REVIEW OF SYSTEMS    Unable to obtain on sedation and mechanical ventilation  PHYSICAL EXAMINATION   Vital Signs: Temp:  [98.2 F (36.8 C)-99 F (37.2 C)] 98.6 F (37 C) (12/25 0800) Pulse Rate:  [60-98] 98 (12/25 1100) Resp:  [13-20] 16 (12/25 1100) BP: (92-144)/(60-88) 130/78 (12/25 1100) SpO2:  [94 %-100 %] 94 % (12/25 1100) FiO2 (%):  [28 %-35 %] 28 % (12/25 0938) Weight:  [59.5 kg] 59.5 kg (12/25 0500)  GENERAL:age appropriate sedated on MV HEAD: Normocephalic, atraumatic.  EYES: Pupils equal, round, reactive to light.  No scleral icterus.  MOUTH: Moist mucosal membrane. NECK: Supple. No thyromegaly. No nodules. No JVD.  PULMONARY: mild rhonchi CARDIOVASCULAR: S1 and S2. Regular rate and rhythm. No murmurs, rubs, or gallops.  GASTROINTESTINAL: Soft, nontender, non-distended. No masses. Positive bowel sounds. No hepatosplenomegaly.  MUSCULOSKELETAL: No swelling, clubbing, or edema.  NEUROLOGIC: GCS3T SKIN:intact,warm,dry   PERTINENT DATA     Infusions: . sodium chloride Stopped (06/14/20 0800)  . sodium chloride    . sodium chloride Stopped (06/12/20 1131)  . dexmedetomidine (PRECEDEX) IV infusion 1.2 mcg/kg/hr (06/17/20 1031)  . feeding supplement (VITAL AF 1.2 CAL) 1,000 mL (06/15/20 1649)  . fentaNYL infusion INTRAVENOUS 250 mcg/hr (06/17/20 0451)   Scheduled Medications: . amitriptyline  25 mg Per Tube QHS  . apixaban  5 mg Per Tube BID  . calcium-vitamin D  1 tablet Per Tube Q breakfast  . chlorhexidine gluconate (MEDLINE KIT)  15 mL Mouth Rinse BID  . Chlorhexidine Gluconate Cloth  6 each Topical Daily  . digoxin  0.125 mg Intravenous Daily  . diltiazem  30 mg Per Tube Q6H  . docusate  100 mg Per Tube BID  . gabapentin  300 mg Per Tube TID  .  insulin aspart  0-9 Units Subcutaneous Q4H  . ipratropium-albuterol  3 mL Nebulization Q6H  . mouth rinse  15 mL  Mouth Rinse 10 times per day  . methylPREDNISolone (SOLU-MEDROL) injection  40 mg Intravenous Q24H  . metoprolol tartrate  50 mg Per Tube BID  . midodrine  10 mg Per Tube TID WC  . pantoprazole sodium  40 mg Per Tube QHS  . polyethylene glycol  17 g Per Tube Daily  . QUEtiapine  25 mg Per Tube QHS  . sodium chloride flush  10-40 mL Intracatheter Q12H  . sodium chloride flush  3 mL Intravenous Q12H   PRN Medications: sodium chloride, acetaminophen (TYLENOL) oral liquid 160 mg/5 mL, diazepam, fentaNYL, ipratropium-albuterol, metoprolol tartrate, midazolam, midazolam, ondansetron (ZOFRAN) IV, polyethylene glycol, sodium chloride flush, sodium chloride flush Hemodynamic parameters:   Intake/Output: 12/24 0701 - 12/25 0700 In: 401.6 [I.V.:401.6] Out: 3075 [Urine:2175; Stool:900]  Ventilator  Settings: Vent Mode: PRVC FiO2 (%):  [28 %-35 %] 28 % Set Rate:  [14 bmp] 14 bmp Vt Set:  [420 mL] 420 mL PEEP:  [5 cmH20] 5 cmH20 Plateau Pressure:  [14 cmH20-15 cmH20] 15 cmH20   LAB RESULTS:  Basic Metabolic Panel: Recent Labs  Lab 06/13/20 0606 06/14/20 0135 06/15/20 0315 06/16/20 0440 06/17/20 0500  NA 140 141 144 146* 145  K 4.6 5.0 4.9 4.1 4.3  CL 103 103 102 100 99  CO2 32 33* 38* 40* 41*  GLUCOSE 114* 113* 102* 141* 158*  BUN 40* 43* 48* 35* 31*  CREATININE 0.92 0.84 0.65 0.57 0.44  CALCIUM 8.9 8.8* 9.0 8.7* 8.7*  MG 2.1 2.2 2.1 2.0 2.3  PHOS 4.1 4.1 3.1 2.9 3.0   Liver Function Tests: No results for input(s): AST, ALT, ALKPHOS, BILITOT, PROT, ALBUMIN in the last 168 hours. No results for input(s): LIPASE, AMYLASE in the last 168 hours. No results for input(s): AMMONIA in the last 168 hours. CBC: Recent Labs  Lab 06/13/20 0820 06/14/20 0135 06/15/20 0315 06/16/20 0440 06/17/20 0500  WBC 22.1* 23.3* 20.9* 19.3* 14.1*  HGB 8.4* 8.7* 7.9* 7.9* 7.6*  HCT 28.6* 28.5* 25.9* 25.4* 25.1*  MCV 104.0* 102.9* 102.0* 102.0* 102.9*  PLT 313 319 280 256 245   Cardiac  Enzymes: No results for input(s): CKTOTAL, CKMB, CKMBINDEX, TROPONINI in the last 168 hours. BNP: Invalid input(s): POCBNP CBG: Recent Labs  Lab 06/16/20 1931 06/16/20 2328 06/17/20 0352 06/17/20 0749 06/17/20 1228  GLUCAP 126* 141* 141* 131* 142*       IMAGING RESULTS:  Imaging: No results found. _0 @ No results found.   ASSESSMENT AND PLAN    -Multidisciplinary rounds held today  Acute Hypoxic Respiratory Failure -FiO2 is at 30% today -AECOPD  -continue Full MV support -continue Bronchodilator Therapy -Wean Fio2 and PEEP as tolerated -will perform SAT/SBT when respiratory parameters are met -s/p KUB - OGT advanced as indicated by radiologist -s/p Tracheostomy   Acute on chronic diastolic CHF  - patient is on levophed and metoprolol - will get rid of levophed and use OGT midodrine for vasopressor support   GI/Nutrition GI PROPHYLAXIS as indicated DIET-->TF's as tolerated Constipation protocol as indicated  ENDO - ICU hypoglycemic\Hyperglycemia protocol -check FSBS per protocol   ELECTROLYTES -follow labs as needed -replace as needed -pharmacy consultation   DVT/GI PRX ordered -SCDs  TRANSFUSIONS AS NEEDED MONITOR FSBS ASSESS the need for LABS as needed   Critical care provider statement:    Critical care time (minutes):  33   Critical  care time was exclusive of:  Separately billable procedures and treating other patients   Critical care was necessary to treat or prevent imminent or life-threatening deterioration of the following conditions:  acute hypoxemic respiratory failure, AECOPD, multiple comorbid conditions   Critical care was time spent personally by me on the following activities:  Development of treatment plan with patient or surrogate, discussions with consultants, evaluation of patient's response to treatment, examination of patient, obtaining history from patient or surrogate, ordering and performing treatments and  interventions, ordering and review of laboratory studies and re-evaluation of patient's condition.  I assumed direction of critical care for this patient from another provider in my specialty: no    This document was prepared using Dragon voice recognition software and may include unintentional dictation errors.    Ottie Glazier, M.D.  Division of [place]

## 2020-06-17 NOTE — Progress Notes (Signed)
Pt noted without NG tube in place, received in report that she pulled it out. Pt alert and responsive, non verbal due to tracheostomy. Nods yes/no appropriately. Nodded no when I explained that tube needed to be replaced. Pt noted with increased anxiety, prn medications noted as given per MAR. Will monitor and speak with pt when calmer.

## 2020-06-17 NOTE — Consult Note (Signed)
PHARMACY CONSULT NOTE  Pharmacy Consult for Electrolyte Monitoring and Replacement   Recent Labs: Potassium (mmol/L)  Date Value  06/17/2020 4.3   Magnesium (mg/dL)  Date Value  72/90/2111 2.3   Calcium (mg/dL)  Date Value  55/20/8022 8.7 (L)   Albumin (g/dL)  Date Value  33/61/2244 3.0 (L)   Phosphorus (mg/dL)  Date Value  97/53/0051 3.0   Sodium (mmol/L)  Date Value  06/17/2020 145   Corrected Ca: 9.5 mg/dL  Assessment: 69 yo female admitted with acute on chronic hypoxic hypercapnic respiratory failure and acute encephalopathy secondary to narcotic use, AECOPD requiring mechanical intubation.  Goal of Therapy:  Potassium 4.0 - 5.1 mmol/L Magnesium 2.0 - 2.4 mg/dL All Other Electrolytes within normal limits  Plan:   No replacement needed today  Next labs 12/26 am  Lowella Bandy 06/17/2020 8:28 AM

## 2020-06-17 NOTE — Progress Notes (Signed)
Pt very agitated, pulling away refusing to allow NG tube to be reinserted. "mouthed no, I don't want it"  Prn versed given. NG not reinserted. Fentanyl and Precedex infusing IV, pt denies pain and initially agreed to have NG reinserted at this time. Education provided as to purpose of the tube and pt nodded yes, when I asked if I could put it back in.

## 2020-06-17 NOTE — Progress Notes (Signed)
Pt son [name] is visiting, he is aware pt pulled NG tube out and does not want it replaced. PT and son request peg tube placement. Dr. Karna Christmas notified. ST/ swallowing eval ordered.  Consent received from pt and her son to place a peg tube.

## 2020-06-17 NOTE — Progress Notes (Addendum)
Pt remains on vent overnight. Pt remains on dex + Fent gtt's. PRN versed given 2x for anxiety. Foley and flexi-seal remain in place. Pt remains in NSR overnight. Turn q2. Pt in no acute distress @ this time. Will continue to monitor.

## 2020-06-18 LAB — CBC
HCT: 23.3 % — ABNORMAL LOW (ref 36.0–46.0)
Hemoglobin: 6.9 g/dL — ABNORMAL LOW (ref 12.0–15.0)
MCH: 30.7 pg (ref 26.0–34.0)
MCHC: 29.6 g/dL — ABNORMAL LOW (ref 30.0–36.0)
MCV: 103.6 fL — ABNORMAL HIGH (ref 80.0–100.0)
Platelets: 203 10*3/uL (ref 150–400)
RBC: 2.25 MIL/uL — ABNORMAL LOW (ref 3.87–5.11)
RDW: 14.6 % (ref 11.5–15.5)
WBC: 10.6 10*3/uL — ABNORMAL HIGH (ref 4.0–10.5)
nRBC: 0.2 % (ref 0.0–0.2)

## 2020-06-18 LAB — BASIC METABOLIC PANEL
Anion gap: 9 (ref 5–15)
BUN: 24 mg/dL — ABNORMAL HIGH (ref 8–23)
CO2: 39 mmol/L — ABNORMAL HIGH (ref 22–32)
Calcium: 8.7 mg/dL — ABNORMAL LOW (ref 8.9–10.3)
Chloride: 100 mmol/L (ref 98–111)
Creatinine, Ser: 0.55 mg/dL (ref 0.44–1.00)
GFR, Estimated: >60 mL/min (ref >60)
Glucose, Bld: 87 mg/dL (ref 70–99)
Potassium: 3.9 mmol/L (ref 3.5–5.1)
Sodium: 148 mmol/L — ABNORMAL HIGH (ref 135–145)

## 2020-06-18 LAB — GLUCOSE, CAPILLARY
Glucose-Capillary: 74 mg/dL (ref 70–99)
Glucose-Capillary: 78 mg/dL (ref 70–99)
Glucose-Capillary: 78 mg/dL (ref 70–99)
Glucose-Capillary: 85 mg/dL (ref 70–99)
Glucose-Capillary: 111 mg/dL — ABNORMAL HIGH (ref 70–99)
Glucose-Capillary: 104 mg/dL — ABNORMAL HIGH (ref 70–99)

## 2020-06-18 LAB — PREPARE RBC (CROSSMATCH)

## 2020-06-18 LAB — PHOSPHORUS: Phosphorus: 3.1 mg/dL (ref 2.5–4.6)

## 2020-06-18 LAB — HEMOGLOBIN AND HEMATOCRIT, BLOOD
HCT: 27.4 % — ABNORMAL LOW (ref 36.0–46.0)
Hemoglobin: 8.6 g/dL — ABNORMAL LOW (ref 12.0–15.0)

## 2020-06-18 LAB — MAGNESIUM: Magnesium: 2.2 mg/dL (ref 1.7–2.4)

## 2020-06-18 MED ORDER — POTASSIUM CHLORIDE 20 MEQ PO PACK: 20.0000 meq | PACK | Freq: Once | ORAL | Status: DC

## 2020-06-18 MED ORDER — MIDAZOLAM HCL 2 MG/2ML IJ SOLN
1.0000 mg | INTRAMUSCULAR | Status: DC | PRN
  Administered 2020-06-18 – 2020-06-22 (×20): 1 mg via INTRAVENOUS
  Filled 2020-06-18 (×21): qty 2

## 2020-06-18 MED ORDER — SODIUM CHLORIDE 0.9% IV SOLUTION: Freq: Once | INTRAVENOUS | Status: DC

## 2020-06-18 NOTE — Progress Notes (Signed)
CRITICAL CARE PROGRESS NOTE    Name: KYANA AICHER MRN: [id number] DOB: [birth year]     LOS: 84   SUBJECTIVE FINDINGS & SIGNIFICANT EVENTS    Patient description:  69 yo female with severe COPD admitted with acute on chronic hypoxic hypercapnic respiratory failure and acute encephalopathy secondary to narcotic use and AECOPD requiring mechanical intubation  12/14 admitted to ICU for COPD exacerbation 12/15 plan for SAT/SBT 12/16 failed SAT/SBT, will try again today 12/16 s/p extubation to BiPAP 12/17 resp status stilltenuous 12/18 severe resp failure high risk for intubation 12/18 Re intubated for severe COPD 12/19 severe resp  Failure, ENT consulted for trach  06/12/20- sedated to RASS-2.  Plan for trache as per previous discussion with critical care team and family. DCd amio gtt today . Weaning solumedrol to 40 daily from BID. Patient on Levophed and metoprolol we will remove arrythmogenic vasopressor today.   12/21- Trache postponed to 12/23 due to anticoagulation. Patient weaned down to 21% will attempt SBT again today- patient failed with severe hypercapnia, plan continues to be for trache. Family updated today. Poke with Judeen Hammans DIL.  12/22- no events overnight. Plan for trache in am.   12/23- pt s/p trache and OGT for nourishment. Patient is able to respond appropriately to verbal communication.  12/24- patient had episodes of SVT today.  She is on scheduled bb and is in NSR now.   06/17/20- patient unchanged without overnight events. Weaning FIo2 on ventilator today.  06/18/20- patient is awake and communcative on fentanyl and precedex RASS-0.  I spoke to son POA and after speaking with him and they wish to have PEG placed. Will call GI for consult.    Lines/tubes : Airway 7.5 mm (Active)   Secured at (cm) 22 cm 06/12/20 0807  Measured From Lips 06/12/20 0807  Secured Location Left 06/12/20 0807  Secured By [employer] 06/12/20 0807  Tube Holder Repositioned Yes 06/12/20 0807  Prone position No 06/12/20 0807  Cuff Pressure (cm H2O) 26 cm H2O 06/12/20 0807  Site Condition Dry;Cool 06/12/20 0807     CVC Triple Lumen 06/10/20 Left Internal jugular (Active)  Indication for Insertion or Continuance of Line Chronic illness with exacerbations (CF, Sickle Cell, etc.) 06/11/20 2100  Site Assessment Clean;Dry;Intact 06/11/20 2100  Proximal Lumen Status Infusing;Flushed;Blood return noted 06/11/20 2100  Medial Lumen Status Infusing 06/11/20 2100  Distal Lumen Status Infusing;Flushed;Blood return noted 06/11/20 2100  Dressing Type Transparent;Occlusive 06/11/20 2100  Dressing Status Clean;Dry;Intact 06/11/20 2100  Antimicrobial disc in place? Yes 06/11/20 2100  Line Care Connections checked and tightened 06/11/20 2100  Dressing Intervention New dressing 06/10/20 2100  Dressing Change Due 06/17/20 06/11/20 2100     NG/OG Tube Orogastric Center mouth Xray (Active)  Cm Marking at Nare/Corner of Mouth (if applicable) 61 cm 22/97/98 2000  Site Assessment Clean;Dry;Intact 06/11/20 2000  Ongoing Placement Verification No change in cm markings or external length of tube from initial placement;No change in respiratory status;No acute changes, not attributed to clinical condition 06/11/20 2000  Status Infusing tube feed 06/11/20 2000  Drainage Appearance None 06/11/20 1600  Intake (mL) 30 mL 06/11/20 1600     Urethral Catheter Skeet Latch, RN Double-lumen;Latex 14 Fr. (Active)  Indication for Insertion or Continuance of Catheter Therapy based on hourly urine output monitoring and documentation for critical condition (NOT STRICT I&O) 06/12/20 0800  Site Assessment Clean;Intact 06/12/20 0800  Catheter Maintenance Bag below level of bladder;Catheter secured;Drainage  bag/tubing not touching floor;Insertion date on  drainage bag;No dependent loops;Seal intact 06/12/20 0800  Collection Container Standard drainage bag 06/12/20 0800  Securement Method Securing device (Describe) 06/12/20 0800  Urinary Catheter Interventions (if applicable) Unclamped 53/66/44 0800  Output (mL) 60 mL 06/11/20 1800    Microbiology/Sepsis markers: Results for orders placed or performed during the hospital encounter of 06/06/20  Blood culture (routine x 2)     Status: None   Collection Time: 06/06/20 12:07 PM   Specimen: BLOOD  Result Value Ref Range Status   Specimen Description BLOOD RIGHT [facility]  Final   Special Requests   Final    BOTTLES DRAWN AEROBIC AND ANAEROBIC Blood Culture adequate volume   Culture   Final    NO GROWTH 5 DAYS Performed at [facility], [address], [place], [place] [address]    Report Status 06/11/2020 FINAL  Final  Blood culture (routine x 2)     Status: None   Collection Time: 06/06/20 12:07 PM   Specimen: BLOOD  Result Value Ref Range Status   Specimen Description BLOOD LEFT AC  Final   Special Requests   Final    BOTTLES DRAWN AEROBIC AND ANAEROBIC Blood Culture adequate volume   Culture   Final    NO GROWTH 5 DAYS Performed at [facility], [address] [place], [place] [address]    Report Status 06/11/2020 FINAL  Final  Resp Panel by RT-PCR (Flu A&B, Covid) Nasopharyngeal Swab     Status: None   Collection Time: 06/06/20 12:07 PM   Specimen: Nasopharyngeal Swab; Nasopharyngeal(NP) swabs in vial transport medium  Result Value Ref Range Status   SARS Coronavirus 2 by RT PCR NEGATIVE NEGATIVE Final    Comment: (NOTE) SARS-CoV-2 target nucleic acids are NOT DETECTED.  The SARS-CoV-2 RNA is generally detectable in upper respiratory specimens during the acute phase of infection. The lowest concentration of SARS-CoV-2 viral copies this assay can detect is 138 copies/mL. A negative result does not preclude  SARS-Cov-2 infection and should not be used as the sole basis for treatment or other patient management decisions. A negative result may occur with  improper specimen collection/handling, submission of specimen other than nasopharyngeal swab, presence of viral mutation(s) within the areas targeted by this assay, and inadequate number of viral copies(<138 copies/mL). A negative result must be combined with clinical observations, patient history, and epidemiological information. The expected result is Negative.  Fact Sheet for Patients:  EntrepreneurPulse.com.au  Fact Sheet for Healthcare Providers:  IncredibleEmployment.be  This test is no t yet approved or cleared by the [place] FDA and  has been authorized for detection and/or diagnosis of SARS-CoV-2 by FDA under an Emergency Use Authorization (EUA). This EUA will remain  in effect (meaning this test can be used) for the duration of the COVID-19 declaration under Section 564(b)(1) of the Act, 21 U.S.C.section 360bbb-3(b)(1), unless the authorization is terminated  or revoked sooner.       Influenza A by PCR NEGATIVE NEGATIVE Final   Influenza B by PCR NEGATIVE NEGATIVE Final    Comment: (NOTE) The Xpert Xpress SARS-CoV-2/FLU/RSV plus assay is intended as an aid in the diagnosis of influenza from Nasopharyngeal swab specimens and should not be used as a sole basis for treatment. Nasal washings and aspirates are unacceptable for Xpert Xpress SARS-CoV-2/FLU/RSV testing.  Fact Sheet for Patients: EntrepreneurPulse.com.au  Fact Sheet for Healthcare Providers: IncredibleEmployment.be  This test is not yet approved or cleared by the [place] FDA and has been authorized for detection  and/or diagnosis of SARS-CoV-2 by FDA under an Emergency Use Authorization (EUA). This EUA will remain in effect (meaning this test can be used) for the duration of  the COVID-19 declaration under Section 564(b)(1) of the Act, 21 U.S.C. section 360bbb-3(b)(1), unless the authorization is terminated or revoked.  Performed at [facility], [address] [place], [place] [address]   MRSA PCR Screening     Status: None   Collection Time: 06/06/20 11:00 PM   Specimen: Nasopharyngeal  Result Value Ref Range Status   MRSA by PCR NEGATIVE NEGATIVE Final    Comment:        The GeneXpert MRSA Assay (FDA approved for NASAL specimens only), is one component of a comprehensive MRSA colonization surveillance program. It is not intended to diagnose MRSA infection nor to guide or monitor treatment for MRSA infections. Performed at [facility], [address] [place], [place] [address]     Anti-infectives:  Anti-infectives (From admission, onward)   Start     Dose/Rate Route Frequency Ordered Stop   06/07/20 1400  azithromycin (ZITHROMAX) 500 mg in sodium chloride 0.9 % 250 mL IVPB  Status:  Discontinued        500 mg 250 mL/hr over 60 Minutes Intravenous Every 24 hours 06/06/20 2030 06/06/20 2044   06/07/20 1400  azithromycin (ZITHROMAX) 500 mg in sodium chloride 0.9 % 250 mL IVPB  Status:  Discontinued        500 mg 250 mL/hr over 60 Minutes Intravenous Every 24 hours 06/06/20 2044 06/08/20 1031   06/06/20 1415  cefTRIAXone (ROCEPHIN) 2 g in sodium chloride 0.9 % 100 mL IVPB        2 g 200 mL/hr over 30 Minutes Intravenous  Once 06/06/20 1407 06/06/20 1514   06/06/20 1415  azithromycin (ZITHROMAX) 500 mg in sodium chloride 0.9 % 250 mL IVPB        500 mg 250 mL/hr over 60 Minutes Intravenous  Once 06/06/20 1407 06/06/20 1708       Consults:   ENT - tracheostomy  PAST MEDICAL HISTORY   Past Medical History:  Diagnosis Date  . Anemia   . Anxiety   . Arthritis   . Atrial fibrillation ([place])   . C. difficile colitis 09/2015  . COPD (chronic obstructive pulmonary disease) ([place])   . Depression   . Dyspnea   .  Dysrhythmia   . Fibromyalgia   . H/O tracheostomy   . Hep C w/ coma, chronic   . Hypertension   . MRSA pneumonia ([place]) 2017  . On home oxygen therapy    3 L/M   . Osteoporosis   . Peripheral neuropathy   . RLS (restless legs syndrome)   . S/P percutaneous endoscopic gastrostomy (PEG) tube placement ([place]) 09/2015  . Ventilator associated pneumonia [facility]) 10/2015   [facility], [place]     SURGICAL HISTORY   Past Surgical History:  Procedure Laterality Date  . ABDOMINAL HYSTERECTOMY    . BREAST SURGERY Bilateral    Breast Implants  . DILATION AND CURETTAGE OF UTERUS    . REVERSE SHOULDER ARTHROPLASTY Right 03/06/2017   Procedure: REVERSE SHOULDER ARTHROPLASTY;  Surgeon: Corky Mull, MD;  Location: [facility] ORS;  Service: Orthopedics;  Laterality: Right;  . ROTATOR CUFF REPAIR Bilateral   . TRACHEOSTOMY TUBE PLACEMENT N/A 06/15/2020   Procedure: TRACHEOSTOMY;  Surgeon: Clyde Canterbury, MD;  Location: [facility] ORS;  Service: ENT;  Laterality: N/A;     FAMILY HISTORY  Family History  Problem Relation Age of Onset  . Hypertension Mother      SOCIAL HISTORY   Social History   Tobacco Use  . Smoking status: Former Smoker    Packs/day: 1.50    Types: Cigarettes    Quit date: 10/05/2015    Years since quitting: 4.7  . Smokeless tobacco: Never Used  Vaping Use  . Vaping Use: Some days  . Last attempt to quit: 10/05/2015  Substance Use Topics  . Alcohol use: No  . Drug use: No     MEDICATIONS   Current Medication:  Current Facility-Administered Medications:  .  0.9 %  sodium chloride infusion (Manually program via Guardrails IV Fluids), , Intravenous, Once, Rust-Chester, Huel Cote, NP, Held at 06/18/20 [phone] .  0.9 %  sodium chloride infusion, 250 mL, Intravenous, Continuous, Duffy Bruce, MD, Stopped at 06/14/20 0800 .  0.9 %  sodium chloride infusion, 250 mL, Intravenous, PRN, Mortimer Fries, Kurian, MD .  0.9 %  sodium chloride infusion, 250 mL, Intravenous,  Continuous, Kasa, Kurian, MD, Stopped at 06/12/20 1131 .  acetaminophen (TYLENOL) 160 MG/5ML solution 650 mg, 650 mg, Per Tube, Q4H PRN, Flora Lipps, MD .  amitriptyline (ELAVIL) tablet 25 mg, 25 mg, Per Tube, QHS, Kasa, Kurian, MD, 25 mg at 06/16/20 2120 .  apixaban (ELIQUIS) tablet 5 mg, 5 mg, Per Tube, BID, Ottie Glazier, MD, 5 mg at 06/16/20 2119 .  calcium-vitamin D (OSCAL WITH D) 500-200 MG-UNIT per tablet 1 tablet, 1 tablet, Per Tube, Q breakfast, Flora Lipps, MD, 1 tablet at 06/16/20 0846 .  chlorhexidine gluconate (MEDLINE KIT) (PERIDEX) 0.12 % solution 15 mL, 15 mL, Mouth Rinse, BID, Kasa, Kurian, MD, 15 mL at 06/18/20 0824 .  Chlorhexidine Gluconate Cloth 2 % PADS 6 each, 6 each, Topical, Daily, Flora Lipps, MD, 6 each at 06/17/20 1000 .  dexmedetomidine (PRECEDEX) 400 MCG/100ML (4 mcg/mL) infusion, 0.4-1.2 mcg/kg/hr, Intravenous, Titrated, Kazaria Gaertner, MD, Last Rate: 17.94 mL/hr at 06/18/20 1100, 1.2 mcg/kg/hr at 06/18/20 1100 .  diazepam (VALIUM) tablet 5 mg, 5 mg, Per Tube, Q8H PRN, Rust-Chester, Britton L, NP, 5 mg at 06/15/20 2143 .  digoxin (LANOXIN) 0.25 MG/ML injection 0.125 mg, 0.125 mg, Intravenous, Daily, Lanney Gins, Jouri Threat, MD, 0.125 mg at 06/18/20 0923 .  diltiazem (CARDIZEM) tablet 30 mg, 30 mg, Per Tube, Q6H, Belue, Alver Sorrow, RPH, 30 mg at 06/17/20 0540 .  docusate (COLACE) 50 MG/5ML liquid 100 mg, 100 mg, Per Tube, BID, Renda Rolls, RPH, 100 mg at 06/16/20 2120 .  feeding supplement (VITAL AF 1.2 CAL) liquid 1,000 mL, 1,000 mL, Per Tube, Continuous, Driana Dazey, MD, Last Rate: 50 mL/hr at 06/15/20 1649, 1,000 mL at 06/15/20 1649 .  fentaNYL (SUBLIMAZE) bolus via infusion 25 mcg, 25 mcg, Intravenous, Q15 min PRN, Darel Hong D, NP, 25 mcg at 06/17/20 0655 .  fentaNYL 2573mg in NS 2554m(1066mml) infusion-PREMIX, 0-400 mcg/hr, Intravenous, Continuous, Kasa, Kurian, MD, Last Rate: 20 mL/hr at 06/18/20 1100, 200 mcg/hr at 06/18/20 1100 .  gabapentin  (NEURONTIN) capsule 300 mg, 300 mg, Per Tube, TID, KasFlora LippsD, 300 mg at 06/16/20 2119 .  insulin aspart (novoLOG) injection 0-9 Units, 0-9 Units, Subcutaneous, Q4H, Rust-Chester, Britton L, NP, 1 Units at 06/17/20 2045 .  ipratropium-albuterol (DUONEB) 0.5-2.5 (3) MG/3ML nebulizer solution 3 mL, 3 mL, Nebulization, Q6H, Blakeney, Dana G, NP, 3 mL at 06/18/20 0721 .  ipratropium-albuterol (DUONEB) 0.5-2.5 (3) MG/3ML nebulizer solution 3 mL, 3 mL, Nebulization, Q6H PRN, BlaAwilda Bill  NP .  MEDLINE mouth rinse, 15 mL, Mouth Rinse, 10 times per day, Flora Lipps, MD, 15 mL at 06/18/20 1117 .  methylPREDNISolone sodium succinate (SOLU-MEDROL) 40 mg/mL injection 40 mg, 40 mg, Intravenous, Q24H, Lanney Gins, Derrious Bologna, MD, 40 mg at 06/18/20 0923 .  metoprolol tartrate (LOPRESSOR) 25 mg/10 mL oral suspension 50 mg, 50 mg, Per Tube, BID, Benita Gutter, RPH, 50 mg at 06/16/20 2120 .  metoprolol tartrate (LOPRESSOR) injection 5 mg, 5 mg, Intravenous, Q3H PRN, Ottie Glazier, MD, 5 mg at 06/17/20 2006 .  midazolam (VERSED) injection 1 mg, 1 mg, Intravenous, Q1H PRN, Rust-Chester, Britton L, NP, 1 mg at 06/18/20 1114 .  midodrine (PROAMATINE) tablet 10 mg, 10 mg, Per Tube, TID WC, Zalan Shidler, MD, 10 mg at 06/16/20 1706 .  ondansetron (ZOFRAN) injection 4 mg, 4 mg, Intravenous, Q6H PRN, Mortimer Fries, Kurian, MD .  pantoprazole sodium (PROTONIX) 40 mg/20 mL oral suspension 40 mg, 40 mg, Per Tube, QHS, Benita Gutter, RPH, 40 mg at 06/16/20 2122 .  polyethylene glycol (MIRALAX / GLYCOLAX) packet 17 g, 17 g, Per Tube, Daily PRN, Mortimer Fries, Kurian, MD .  polyethylene glycol (MIRALAX / GLYCOLAX) packet 17 g, 17 g, Per Tube, Daily, Flora Lipps, MD, 17 g at 06/16/20 1239 .  potassium chloride (KLOR-CON) packet 20 mEq, 20 mEq, Per Tube, Once, Dallie Piles, RPH .  QUEtiapine (SEROQUEL) tablet 25 mg, 25 mg, Per Tube, QHS, Flora Lipps, MD, 25 mg at 06/16/20 2119 .  sodium chloride flush (NS) 0.9 % injection 10-40 mL,  10-40 mL, Intracatheter, Q12H, Kasa, Kurian, MD, 10 mL at 06/17/20 2045 .  sodium chloride flush (NS) 0.9 % injection 10-40 mL, 10-40 mL, Intracatheter, PRN, Mortimer Fries, Kurian, MD .  sodium chloride flush (NS) 0.9 % injection 3 mL, 3 mL, Intravenous, Q12H, Kasa, Kurian, MD, 3 mL at 06/17/20 2046 .  sodium chloride flush (NS) 0.9 % injection 3 mL, 3 mL, Intravenous, PRN, Flora Lipps, MD    ALLERGIES   Patient has no known allergies.    REVIEW OF SYSTEMS    Unable to obtain on sedation and mechanical ventilation  PHYSICAL EXAMINATION   Vital Signs: Temp:  [97.8 F (36.6 C)-100.2 F (37.9 C)] 99.7 F (37.6 C) (12/26 0800) Pulse Rate:  [35-135] 116 (12/26 1100) Resp:  [13-29] 29 (12/26 1100) BP: (125-192)/(75-119) 165/113 (12/26 1100) SpO2:  [88 %-100 %] 93 % (12/26 1100) FiO2 (%):  [28 %] 28 % (12/26 0800) Weight:  [62.6 kg] 62.6 kg (12/26 0500)  GENERAL:age appropriate sedated on MV HEAD: Normocephalic, atraumatic.  EYES: Pupils equal, round, reactive to light.  No scleral icterus.  MOUTH: Moist mucosal membrane. NECK: Supple. No thyromegaly. No nodules. No JVD.  PULMONARY: mild rhonchi CARDIOVASCULAR: S1 and S2. Regular rate and rhythm. No murmurs, rubs, or gallops.  GASTROINTESTINAL: Soft, nontender, non-distended. No masses. Positive bowel sounds. No hepatosplenomegaly.  MUSCULOSKELETAL: No swelling, clubbing, or edema.  NEUROLOGIC: GCS3T SKIN:intact,warm,dry   PERTINENT DATA     Infusions: . sodium chloride Stopped (06/14/20 0800)  . sodium chloride    . sodium chloride Stopped (06/12/20 1131)  . dexmedetomidine (PRECEDEX) IV infusion 1.2 mcg/kg/hr (06/18/20 1100)  . feeding supplement (VITAL AF 1.2 CAL) 1,000 mL (06/15/20 1649)  . fentaNYL infusion INTRAVENOUS 200 mcg/hr (06/18/20 1100)   Scheduled Medications: . sodium chloride   Intravenous Once  . amitriptyline  25 mg Per Tube QHS  . apixaban  5 mg Per Tube BID  . calcium-vitamin D  1 tablet  Per Tube Q  breakfast  . chlorhexidine gluconate (MEDLINE KIT)  15 mL Mouth Rinse BID  . Chlorhexidine Gluconate Cloth  6 each Topical Daily  . digoxin  0.125 mg Intravenous Daily  . diltiazem  30 mg Per Tube Q6H  . docusate  100 mg Per Tube BID  . gabapentin  300 mg Per Tube TID  . insulin aspart  0-9 Units Subcutaneous Q4H  . ipratropium-albuterol  3 mL Nebulization Q6H  . mouth rinse  15 mL Mouth Rinse 10 times per day  . methylPREDNISolone (SOLU-MEDROL) injection  40 mg Intravenous Q24H  . metoprolol tartrate  50 mg Per Tube BID  . midodrine  10 mg Per Tube TID WC  . pantoprazole sodium  40 mg Per Tube QHS  . polyethylene glycol  17 g Per Tube Daily  . potassium chloride  20 mEq Per Tube Once  . QUEtiapine  25 mg Per Tube QHS  . sodium chloride flush  10-40 mL Intracatheter Q12H  . sodium chloride flush  3 mL Intravenous Q12H   PRN Medications: sodium chloride, acetaminophen (TYLENOL) oral liquid 160 mg/5 mL, diazepam, fentaNYL, ipratropium-albuterol, metoprolol tartrate, midazolam, ondansetron (ZOFRAN) IV, polyethylene glycol, sodium chloride flush, sodium chloride flush Hemodynamic parameters:   Intake/Output: 12/25 0701 - 12/26 0700 In: 1643.8 [I.V.:1643.8] Out: 1500 [Urine:1300; Stool:200]  Ventilator  Settings: Vent Mode: PRVC FiO2 (%):  [28 %] 28 % Set Rate:  [14 bmp] 14 bmp Vt Set:  [420 mL] 420 mL PEEP:  [5 cmH20] 5 cmH20   LAB RESULTS:  Basic Metabolic Panel: Recent Labs  Lab 06/14/20 0135 06/15/20 0315 06/16/20 0440 06/17/20 0500 06/18/20 0414  NA 141 144 146* 145 148*  K 5.0 4.9 4.1 4.3 3.9  CL 103 102 100 99 100  CO2 33* 38* 40* 41* 39*  GLUCOSE 113* 102* 141* 158* 87  BUN 43* 48* 35* 31* 24*  CREATININE 0.84 0.65 0.57 0.44 0.55  CALCIUM 8.8* 9.0 8.7* 8.7* 8.7*  MG 2.2 2.1 2.0 2.3 2.2  PHOS 4.1 3.1 2.9 3.0 3.1   Liver Function Tests: No results for input(s): AST, ALT, ALKPHOS, BILITOT, PROT, ALBUMIN in the last 168 hours. No results for input(s): LIPASE,  AMYLASE in the last 168 hours. No results for input(s): AMMONIA in the last 168 hours. CBC: Recent Labs  Lab 06/14/20 0135 06/15/20 0315 06/16/20 0440 06/17/20 0500 06/18/20 0414  WBC 23.3* 20.9* 19.3* 14.1* 10.6*  HGB 8.7* 7.9* 7.9* 7.6* 6.9*  HCT 28.5* 25.9* 25.4* 25.1* 23.3*  MCV 102.9* 102.0* 102.0* 102.9* 103.6*  PLT 319 280 256 245 203   Cardiac Enzymes: No results for input(s): CKTOTAL, CKMB, CKMBINDEX, TROPONINI in the last 168 hours. BNP: Invalid input(s): POCBNP CBG: Recent Labs  Lab 06/17/20 1946 06/18/20 0130 06/18/20 0308 06/18/20 0757 06/18/20 1114  GLUCAP 138* 74 78 78 85       IMAGING RESULTS:  Imaging: No results found. '@PROBHOSP' @ No results found.   ASSESSMENT AND PLAN    -Multidisciplinary rounds held today  Acute Hypoxic Respiratory Failure -FiO2 is at 30% today -AECOPD  -continue Full MV support -continue Bronchodilator Therapy -Wean Fio2 and PEEP as tolerated -will perform SAT/SBT when respiratory parameters are met -s/p KUB - OGT advanced as indicated by radiologist -s/p Tracheostomy   Acute on chronic diastolic CHF  - patient is on levophed and metoprolol - will get rid of levophed and use OGT midodrine for vasopressor support   GI/Nutrition GI PROPHYLAXIS as indicated DIET-->TF's as tolerated  Constipation protocol as indicated  ENDO - ICU hypoglycemic\Hyperglycemia protocol -check FSBS per protocol   ELECTROLYTES -follow labs as needed -replace as needed -pharmacy consultation   DVT/GI PRX ordered -SCDs  TRANSFUSIONS AS NEEDED MONITOR FSBS ASSESS the need for LABS as needed   Critical care provider statement:    Critical care time (minutes):  33   Critical care time was exclusive of:  Separately billable procedures and treating other patients   Critical care was necessary to treat or prevent imminent or life-threatening deterioration of the following conditions:  acute hypoxemic respiratory failure,  AECOPD, multiple comorbid conditions   Critical care was time spent personally by me on the following activities:  Development of treatment plan with patient or surrogate, discussions with consultants, evaluation of patient's response to treatment, examination of patient, obtaining history from patient or surrogate, ordering and performing treatments and interventions, ordering and review of laboratory studies and re-evaluation of patient's condition.  I assumed direction of critical care for this patient from another provider in my specialty: no    This document was prepared using Dragon voice recognition software and may include unintentional dictation errors.    Ottie Glazier, M.D.  Division of [place]

## 2020-06-18 NOTE — Progress Notes (Signed)
Patient's Hgb decreased to 6.9, there is an active FYI in epic for blood products refusal. Unable to verify why this is present- patient remains intermittently confused & there is not other documentation in EMR. The patient did receive blood products in February of 2021, but not this admission. Discussed with son, Farris Blash, to verify that the patient does not have any religious exemptions or personal beliefs that we should be aware of. Mr. Brownie Gockel did not know of any reasons why she should not receive blood products, and provided consent via the telephone.  Will move forward with blood product administration.   [name], AGACNP-BC Acute Care Nurse Practitioner [facility]   [phone] / [phone] Please see Amion for pager details.

## 2020-06-18 NOTE — Plan of Care (Signed)
-   Patient receiving 1 unit of PRBCs today.  - Patient placed on PSV.   Problem: Education: Goal: Knowledge of General Education information will improve Description: Including pain rating scale, medication(s)/side effects and non-pharmacologic comfort measures Outcome: Not Progressing   Problem: Health Behavior/Discharge Planning: Goal: Ability to manage health-related needs will improve Outcome: Not Progressing   Problem: Clinical Measurements: Goal: Ability to maintain clinical measurements within normal limits will improve Outcome: Progressing Goal: Will remain free from infection Outcome: Not Progressing Goal: Diagnostic test results will improve Outcome: Progressing Goal: Respiratory complications will improve Outcome: Progressing Goal: Cardiovascular complication will be avoided Outcome: Progressing   Problem: Activity: Goal: Risk for activity intolerance will decrease Outcome: Not Progressing   Problem: Nutrition: Goal: Adequate nutrition will be maintained Outcome: Not Progressing   Problem: Coping: Goal: Level of anxiety will decrease Outcome: Not Progressing   Problem: Elimination: Goal: Will not experience complications related to bowel motility Outcome: Not Progressing Goal: Will not experience complications related to urinary retention Outcome: Not Progressing   Problem: Pain Managment: Goal: General experience of comfort will improve Outcome: Progressing   Problem: Safety: Goal: Ability to remain free from injury will improve Outcome: Not Progressing   Problem: Skin Integrity: Goal: Risk for impaired skin integrity will decrease Outcome: Not Progressing

## 2020-06-18 NOTE — Plan of Care (Signed)
Worsening AMS and delirium from previous shift. Patient placed back into PRVC and sedation increased.   Problem: Education: Goal: Knowledge of General Education information will improve Description: Including pain rating scale, medication(s)/side effects and non-pharmacologic comfort measures 06/18/2020 2021 by Rosana Fret, RN Outcome: Not Progressing 06/18/2020 1430 by Rosana Fret, RN Outcome: Not Progressing   Problem: Health Behavior/Discharge Planning: Goal: Ability to manage health-related needs will improve 06/18/2020 2021 by Rosana Fret, RN Outcome: Not Progressing 06/18/2020 1430 by Rosana Fret, RN Outcome: Not Progressing   Problem: Clinical Measurements: Goal: Ability to maintain clinical measurements within normal limits will improve 06/18/2020 2021 by Rosana Fret, RN Outcome: Not Progressing 06/18/2020 1430 by Rosana Fret, RN Outcome: Progressing Goal: Will remain free from infection 06/18/2020 2021 by Rosana Fret, RN Outcome: Not Progressing 06/18/2020 1430 by Rosana Fret, RN Outcome: Not Progressing Goal: Diagnostic test results will improve 06/18/2020 2021 by Rosana Fret, RN Outcome: Not Progressing 06/18/2020 1430 by Rosana Fret, RN Outcome: Progressing Goal: Respiratory complications will improve 06/18/2020 2021 by Rosana Fret, RN Outcome: Not Progressing 06/18/2020 1430 by Rosana Fret, RN Outcome: Progressing Goal: Cardiovascular complication will be avoided 06/18/2020 2021 by Rosana Fret, RN Outcome: Not Progressing 06/18/2020 1430 by Rosana Fret, RN Outcome: Progressing   Problem: Activity: Goal: Risk for activity intolerance will decrease 06/18/2020 2021 by Rosana Fret, RN Outcome: Not Progressing 06/18/2020 1430 by Rosana Fret, RN Outcome: Not Progressing   Problem: Nutrition: Goal: Adequate nutrition will be maintained 06/18/2020 2021 by Rosana Fret, RN Outcome: Not  Progressing 06/18/2020 1430 by Rosana Fret, RN Outcome: Not Progressing   Problem: Coping: Goal: Level of anxiety will decrease 06/18/2020 2021 by Rosana Fret, RN Outcome: Not Progressing 06/18/2020 1430 by Rosana Fret, RN Outcome: Not Progressing   Problem: Elimination: Goal: Will not experience complications related to bowel motility 06/18/2020 2021 by Rosana Fret, RN Outcome: Not Progressing 06/18/2020 1430 by Rosana Fret, RN Outcome: Not Progressing Goal: Will not experience complications related to urinary retention 06/18/2020 2021 by Rosana Fret, RN Outcome: Not Progressing 06/18/2020 1430 by Rosana Fret, RN Outcome: Not Progressing   Problem: Pain Managment: Goal: General experience of comfort will improve 06/18/2020 2021 by Rosana Fret, RN Outcome: Not Progressing 06/18/2020 1430 by Rosana Fret, RN Outcome: Progressing   Problem: Safety: Goal: Ability to remain free from injury will improve 06/18/2020 2021 by Rosana Fret, RN Outcome: Not Progressing 06/18/2020 1430 by Rosana Fret, RN Outcome: Not Progressing   Problem: Skin Integrity: Goal: Risk for impaired skin integrity will decrease 06/18/2020 2021 by Rosana Fret, RN Outcome: Not Progressing 06/18/2020 1430 by Rosana Fret, RN Outcome: Not Progressing

## 2020-06-18 NOTE — Consult Note (Signed)
PHARMACY CONSULT NOTE  Pharmacy Consult for Electrolyte Monitoring and Replacement   Recent Labs: Potassium (mmol/L)  Date Value  06/18/2020 3.9   Magnesium (mg/dL)  Date Value  21/97/5883 2.2   Calcium (mg/dL)  Date Value  25/49/8264 8.7 (L)   Albumin (g/dL)  Date Value  15/83/0940 3.0 (L)   Phosphorus (mg/dL)  Date Value  76/80/8811 3.1   Sodium (mmol/L)  Date Value  06/18/2020 148 (H)   Corrected Ca: 9.5 mg/dL  Assessment: 69 yo female admitted with acute on chronic hypoxic hypercapnic respiratory failure and acute encephalopathy secondary to narcotic use, AECOPD requiring mechanical intubation.  Goal of Therapy:  Potassium 4.0 - 5.1 mmol/L Magnesium 2.0 - 2.4 mg/dL All Other Electrolytes within normal limits  Plan:   20 mEq KCl per tube x 1  Next labs 12/27 am  Lowella Bandy 06/18/2020 7:33 AM

## 2020-06-18 NOTE — Progress Notes (Signed)
SLP Cancellation Note  Patient Details Name: Virginia [name] MRN: [id number] DOB: 05/20/1951   Cancelled treatment:       Reason Eval/Treat Not Completed: Medical issues which prohibited therapy;Patient not medically ready (chart reviewed; consulted NSG then met w/ pt/son).  Pt is currently on vent support weaning; tracheostomy performed on 12.23.2021. Pt is alert, mouthing for communication.  Per RT who arrived during session, she is doing well w/ her vent weaning and close to [facility] trials. This SLP explained to Son, pt that it would be best if she could be on TC w/ PMV on place for any/all po's for safer swallowing. They agreed to wait to see how her weaning continued w/ SLP to f/u next 1-2 days for PMV assessment, then BSE hopefully. Will updated MD. NSG updated. Recommended and instructed on frequent oral care for hygiene and stimulation of swallowing.     Orinda Kenner, MS, CCC-SLP Speech Language Pathologist Rehab Services [phone] [facility] 06/18/2020, 4:58 PM

## 2020-06-18 NOTE — Progress Notes (Signed)
A consult was placed to the IV Therapist, requesting 2 new PIVs be placed so that the central line could be removed;  Able to place a 20ga in RUA on the 2nd att, using ultrasound;  Attempted 2 more times in the Left arm to place the 2nd site;  Neither of the catheters would thread;  Poor access; veins bifurcate in many areas. Pt still has central access, and now 1 PIV.

## 2020-06-19 ENCOUNTER — Inpatient Hospital Stay: Payer: Medicare Other

## 2020-06-19 DIAGNOSIS — J9601 Acute respiratory failure with hypoxia: Secondary | ICD-10-CM | POA: Diagnosis not present

## 2020-06-19 DIAGNOSIS — Z7189 Other specified counseling: Secondary | ICD-10-CM | POA: Diagnosis not present

## 2020-06-19 DIAGNOSIS — Z515 Encounter for palliative care: Secondary | ICD-10-CM | POA: Diagnosis not present

## 2020-06-19 DIAGNOSIS — J9602 Acute respiratory failure with hypercapnia: Secondary | ICD-10-CM | POA: Diagnosis not present

## 2020-06-19 LAB — BASIC METABOLIC PANEL
Anion gap: 12 (ref 5–15)
BUN: 19 mg/dL (ref 8–23)
CO2: 33 mmol/L — ABNORMAL HIGH (ref 22–32)
Calcium: 8.5 mg/dL — ABNORMAL LOW (ref 8.9–10.3)
Chloride: 97 mmol/L — ABNORMAL LOW (ref 98–111)
Creatinine, Ser: 0.68 mg/dL (ref 0.44–1.00)
GFR, Estimated: >60 mL/min (ref >60)
Glucose, Bld: 75 mg/dL (ref 70–99)
Potassium: 3.4 mmol/L — ABNORMAL LOW (ref 3.5–5.1)
Sodium: 142 mmol/L (ref 135–145)

## 2020-06-19 LAB — TYPE AND SCREEN
ABO/RH(D): B POS
Antibody Screen: NEGATIVE
Unit division: 0

## 2020-06-19 LAB — BPAM RBC
Blood Product Expiration Date: 202112282359
ISSUE DATE / TIME: 202112261301
Unit Type and Rh: 9500

## 2020-06-19 LAB — CBC
HCT: 26 % — ABNORMAL LOW (ref 36.0–46.0)
Hemoglobin: 8 g/dL — ABNORMAL LOW (ref 12.0–15.0)
MCH: 29.6 pg (ref 26.0–34.0)
MCHC: 30.8 g/dL (ref 30.0–36.0)
MCV: 96.3 fL (ref 80.0–100.0)
Platelets: 171 10*3/uL (ref 150–400)
RBC: 2.7 MIL/uL — ABNORMAL LOW (ref 3.87–5.11)
RDW: 18 % — ABNORMAL HIGH (ref 11.5–15.5)
WBC: 9.2 10*3/uL (ref 4.0–10.5)
nRBC: 0 % (ref 0.0–0.2)

## 2020-06-19 LAB — GLUCOSE, CAPILLARY
Glucose-Capillary: 110 mg/dL — ABNORMAL HIGH (ref 70–99)
Glucose-Capillary: 126 mg/dL — ABNORMAL HIGH (ref 70–99)
Glucose-Capillary: 128 mg/dL — ABNORMAL HIGH (ref 70–99)
Glucose-Capillary: 131 mg/dL — ABNORMAL HIGH (ref 70–99)
Glucose-Capillary: 67 mg/dL — ABNORMAL LOW (ref 70–99)
Glucose-Capillary: 71 mg/dL (ref 70–99)
Glucose-Capillary: 73 mg/dL (ref 70–99)
Glucose-Capillary: 94 mg/dL (ref 70–99)

## 2020-06-19 LAB — MAGNESIUM: Magnesium: 2 mg/dL (ref 1.7–2.4)

## 2020-06-19 LAB — PHOSPHORUS: Phosphorus: 2.8 mg/dL (ref 2.5–4.6)

## 2020-06-19 MED ORDER — METHYLPREDNISOLONE SODIUM SUCC 40 MG IJ SOLR
20.0000 mg | INTRAMUSCULAR | Status: DC
Start: 2020-06-20 — End: 2020-06-22
  Administered 2020-06-20 – 2020-06-22 (×3): 20 mg via INTRAVENOUS
  Filled 2020-06-19 (×3): qty 1

## 2020-06-19 MED ORDER — POTASSIUM CHLORIDE 10 MEQ/50ML IV SOLN
10.0000 meq | INTRAVENOUS | Status: AC
  Administered 2020-06-19 (×4): 10 meq via INTRAVENOUS
  Filled 2020-06-19 (×4): qty 50

## 2020-06-19 MED ORDER — METOPROLOL TARTRATE 5 MG/5ML IV SOLN
5.0000 mg | Freq: Four times a day (QID) | INTRAVENOUS | Status: DC
  Administered 2020-06-19 – 2020-06-22 (×11): 5 mg via INTRAVENOUS
  Filled 2020-06-19 (×12): qty 5

## 2020-06-19 MED ORDER — LABETALOL HCL 5 MG/ML IV SOLN
10.0000 mg | INTRAVENOUS | Status: DC | PRN
  Administered 2020-06-22 – 2020-08-10 (×7): 10 mg via INTRAVENOUS
  Filled 2020-06-19 (×7): qty 4

## 2020-06-19 MED ORDER — DEXTROSE IN LACTATED RINGERS 5 % IV SOLN: INTRAVENOUS | Status: DC

## 2020-06-19 MED ORDER — ENOXAPARIN SODIUM 80 MG/0.8ML ~~LOC~~ SOLN
1.0000 mg/kg | Freq: Two times a day (BID) | SUBCUTANEOUS | Status: DC
  Administered 2020-06-19 – 2020-06-20 (×2): 62.5 mg via SUBCUTANEOUS
  Filled 2020-06-19 (×2): qty 0.8

## 2020-06-19 MED ORDER — PANTOPRAZOLE SODIUM 40 MG IV SOLR
40.0000 mg | Freq: Every day | INTRAVENOUS | Status: DC
  Administered 2020-06-19 – 2020-07-17 (×29): 40 mg via INTRAVENOUS
  Filled 2020-06-19 (×29): qty 40

## 2020-06-19 MED ORDER — ENOXAPARIN SODIUM 60 MG/0.6ML ~~LOC~~ SOLN
1.0000 mg/kg | Freq: Two times a day (BID) | SUBCUTANEOUS | Status: DC
  Filled 2020-06-19: qty 1.2

## 2020-06-19 MED ORDER — METHYLPREDNISOLONE SODIUM SUCC 40 MG IJ SOLR: 10.0000 mg | INTRAMUSCULAR | Status: DC

## 2020-06-19 MED ORDER — INSULIN ASPART 100 UNIT/ML ~~LOC~~ SOLN
0.0000 [IU] | SUBCUTANEOUS | Status: DC
  Administered 2020-06-20 – 2020-07-14 (×3): 1 [IU] via SUBCUTANEOUS
  Filled 2020-06-19 (×5): qty 1

## 2020-06-19 NOTE — Consult Note (Signed)
PHARMACY CONSULT NOTE  Pharmacy Consult for Electrolyte Monitoring and Replacement   Recent Labs: Potassium (mmol/L)  Date Value  06/19/2020 3.4 (L)   Magnesium (mg/dL)  Date Value  92/33/0076 2.0   Calcium (mg/dL)  Date Value  22/63/3354 8.5 (L)   Albumin (g/dL)  Date Value  56/25/6389 3.0 (L)   Phosphorus (mg/dL)  Date Value  37/34/2876 2.8   Sodium (mmol/L)  Date Value  06/19/2020 142   Corrected Ca: 9.3 mg/dL  Assessment: 69 yo female admitted with acute on chronic hypoxic hypercapnic respiratory failure and acute encephalopathy secondary to narcotic use, AECOPD requiring mechanical intubation.  Goal of Therapy:  Potassium 4.0 - 5.1 mmol/L Magnesium 2.0 - 2.4 mg/dL All Other Electrolytes within normal limits  Plan:   10 mEq IV KCl  x 4 per NP  Next labs 12/28 am  Lowella Bandy 06/19/2020 7:21 AM

## 2020-06-19 NOTE — Progress Notes (Signed)
CRITICAL CARE PROGRESS NOTE    Name: Virginia [name] MRN: [id number] DOB: July 10, 1950     LOS: 28   SUBJECTIVE FINDINGS & SIGNIFICANT EVENTS    Patient description:  69 yo female with severe COPD admitted with acute on chronic hypoxic hypercapnic respiratory failure and acute encephalopathy secondary to narcotic use and AECOPD requiring mechanical intubation  12/14 admitted to ICU for COPD exacerbation 12/15 plan for SAT/SBT 12/16 failed SAT/SBT, will try again today 12/16 s/p extubation to BiPAP 12/17 resp status stilltenuous 12/18 severe resp failure high risk for intubation 12/18 Re intubated for severe COPD 12/19 severe resp  Failure, ENT consulted for trach  06/12/20- sedated to RASS-2.  Plan for trache as per previous discussion with critical care team and family. DCd amio gtt today . Weaning solumedrol to 40 daily from BID. Patient on Levophed and metoprolol we will remove arrythmogenic vasopressor today.   12/21- Trache postponed to 12/23 due to anticoagulation. Patient weaned down to 21% will attempt SBT again today- patient failed with severe hypercapnia, plan continues to be for trache. Family updated today. Poke with Judeen Hammans DIL.  12/22- no events overnight. Plan for trache in am.   12/23- pt s/p trache and OGT for nourishment. Patient is able to respond appropriately to verbal communication.  12/24- patient had episodes of SVT today.  She is on scheduled bb and is in NSR now.   06/17/20- patient unchanged without overnight events. Weaning FIo2 on ventilator today.  06/18/20- patient is awake and communcative on fentanyl and precedex RASS-0.  I spoke to son POA and after speaking with him and they wish to have PEG placed. Will call GI for consult.    12/27-confirmed family would like PEG.  Patient lasted 2 hours on trach collar trial  Lines/tubes : Airway 7.5 mm (Active)  Secured at (cm) 22 cm 06/12/20 0807  Measured From Lips 06/12/20 0807  Secured Location Left 06/12/20 0807  Secured By [employer] 06/12/20 0807  Tube Holder Repositioned Yes 06/12/20 0807  Prone position No 06/12/20 0807  Cuff Pressure (cm H2O) 26 cm H2O 06/12/20 0807  Site Condition Dry;Cool 06/12/20 0807     CVC Triple Lumen 06/10/20 Left Internal jugular (Active)  Indication for Insertion or Continuance of Line Chronic illness with exacerbations (CF, Sickle Cell, etc.) 06/11/20 2100  Site Assessment Clean;Dry;Intact 06/11/20 2100  Proximal Lumen Status Infusing;Flushed;Blood return noted 06/11/20 2100  Medial Lumen Status Infusing 06/11/20 2100  Distal Lumen Status Infusing;Flushed;Blood return noted 06/11/20 2100  Dressing Type Transparent;Occlusive 06/11/20 2100  Dressing Status Clean;Dry;Intact 06/11/20 2100  Antimicrobial disc in place? Yes 06/11/20 2100  Line Care Connections checked and tightened 06/11/20 2100  Dressing Intervention New dressing 06/10/20 2100  Dressing Change Due 06/17/20 06/11/20 2100     NG/OG Tube Orogastric Center mouth Xray (Active)  Cm Marking at Nare/Corner of Mouth (if applicable) 61 cm 75/17/00 2000  Site Assessment Clean;Dry;Intact 06/11/20 2000  Ongoing Placement Verification No change in cm markings or external length of tube from initial placement;No change in respiratory status;No acute changes, not attributed to clinical condition 06/11/20 2000  Status Infusing tube feed 06/11/20 2000  Drainage Appearance None 06/11/20 1600  Intake (mL) 30 mL 06/11/20 1600     Urethral Catheter Skeet Latch, RN Double-lumen;Latex 14 Fr. (Active)  Indication for Insertion or Continuance of Catheter Therapy based on hourly urine output monitoring and documentation for critical condition (NOT STRICT I&O) 06/12/20 0800  Site Assessment Clean;Intact 06/12/20  0800  Catheter Maintenance Bag below level of bladder;Catheter secured;Drainage bag/tubing not touching floor;Insertion date on drainage bag;No dependent loops;Seal intact 06/12/20 0800  Collection Container Standard drainage bag 06/12/20 0800  Securement Method Securing device (Describe) 06/12/20 0800  Urinary Catheter Interventions (if applicable) Unclamped 64/33/29 0800  Output (mL) 60 mL 06/11/20 1800    Microbiology/Sepsis markers: Results for orders placed or performed during the hospital encounter of 06/06/20  Blood culture (routine x 2)     Status: None   Collection Time: 06/06/20 12:07 PM   Specimen: BLOOD  Result Value Ref Range Status   Specimen Description BLOOD RIGHT [facility]  Final   Special Requests   Final    BOTTLES DRAWN AEROBIC AND ANAEROBIC Blood Culture adequate volume   Culture   Final    NO GROWTH 5 DAYS Performed at [facility], [address] [place], [place] [address]    Report Status 06/11/2020 FINAL  Final  Blood culture (routine x 2)     Status: None   Collection Time: 06/06/20 12:07 PM   Specimen: BLOOD  Result Value Ref Range Status   Specimen Description BLOOD LEFT AC  Final   Special Requests   Final    BOTTLES DRAWN AEROBIC AND ANAEROBIC Blood Culture adequate volume   Culture   Final    NO GROWTH 5 DAYS Performed at [facility], [address] [place], [place] [address]    Report Status 06/11/2020 FINAL  Final  Resp Panel by RT-PCR (Flu A&B, Covid) Nasopharyngeal Swab     Status: None   Collection Time: 06/06/20 12:07 PM   Specimen: Nasopharyngeal Swab; Nasopharyngeal(NP) swabs in vial transport medium  Result Value Ref Range Status   SARS Coronavirus 2 by RT PCR NEGATIVE NEGATIVE Final    Comment: (NOTE) SARS-CoV-2 target nucleic acids are NOT DETECTED.  The SARS-CoV-2 RNA is generally detectable in upper respiratory specimens during the acute phase of infection. The lowest concentration of SARS-CoV-2 viral copies this  assay can detect is 138 copies/mL. A negative result does not preclude SARS-Cov-2 infection and should not be used as the sole basis for treatment or other patient management decisions. A negative result may occur with  improper specimen collection/handling, submission of specimen other than nasopharyngeal swab, presence of viral mutation(s) within the areas targeted by this assay, and inadequate number of viral copies(<138 copies/mL). A negative result must be combined with clinical observations, patient history, and epidemiological information. The expected result is Negative.  Fact Sheet for Patients:  EntrepreneurPulse.com.au  Fact Sheet for Healthcare Providers:  IncredibleEmployment.be  This test is no t yet approved or cleared by the [place] FDA and  has been authorized for detection and/or diagnosis of SARS-CoV-2 by FDA under an Emergency Use Authorization (EUA). This EUA will remain  in effect (meaning this test can be used) for the duration of the COVID-19 declaration under Section 564(b)(1) of the Act, 21 U.S.C.section 360bbb-3(b)(1), unless the authorization is terminated  or revoked sooner.       Influenza A by PCR NEGATIVE NEGATIVE Final   Influenza B by PCR NEGATIVE NEGATIVE Final    Comment: (NOTE) The Xpert Xpress SARS-CoV-2/FLU/RSV plus assay is intended as an aid in the diagnosis of influenza from Nasopharyngeal swab specimens and should not be used as a sole basis for treatment. Nasal washings and aspirates are unacceptable for Xpert Xpress SARS-CoV-2/FLU/RSV testing.  Fact Sheet for Patients: EntrepreneurPulse.com.au  Fact Sheet for Healthcare Providers: IncredibleEmployment.be  This test is not yet  approved or cleared by the [place] and has been authorized for detection and/or diagnosis of SARS-CoV-2 by FDA under an Emergency Use Authorization (EUA). This EUA will  remain in effect (meaning this test can be used) for the duration of the COVID-19 declaration under Section 564(b)(1) of the Act, 21 U.S.C. section 360bbb-3(b)(1), unless the authorization is terminated or revoked.  Performed at [facility], [address] [place], [place] [address]   MRSA PCR Screening     Status: None   Collection Time: 06/06/20 11:00 PM   Specimen: Nasopharyngeal  Result Value Ref Range Status   MRSA by PCR NEGATIVE NEGATIVE Final    Comment:        The GeneXpert MRSA Assay (FDA approved for NASAL specimens only), is one component of a comprehensive MRSA colonization surveillance program. It is not intended to diagnose MRSA infection nor to guide or monitor treatment for MRSA infections. Performed at [facility], [address], [place], [place] [address]     Anti-infectives:  Anti-infectives (From admission, onward)   Start     Dose/Rate Route Frequency Ordered Stop   06/07/20 1400  azithromycin (ZITHROMAX) 500 mg in sodium chloride 0.9 % 250 mL IVPB  Status:  Discontinued        500 mg 250 mL/hr over 60 Minutes Intravenous Every 24 hours 06/06/20 2030 06/06/20 2044   06/07/20 1400  azithromycin (ZITHROMAX) 500 mg in sodium chloride 0.9 % 250 mL IVPB  Status:  Discontinued        500 mg 250 mL/hr over 60 Minutes Intravenous Every 24 hours 06/06/20 2044 06/08/20 1031   06/06/20 1415  cefTRIAXone (ROCEPHIN) 2 g in sodium chloride 0.9 % 100 mL IVPB        2 g 200 mL/hr over 30 Minutes Intravenous  Once 06/06/20 1407 06/06/20 1514   06/06/20 1415  azithromycin (ZITHROMAX) 500 mg in sodium chloride 0.9 % 250 mL IVPB        500 mg 250 mL/hr over 60 Minutes Intravenous  Once 06/06/20 1407 06/06/20 1708       Consults:   ENT - tracheostomy  PAST MEDICAL HISTORY   Past Medical History:  Diagnosis Date  . Anemia   . Anxiety   . Arthritis   . Atrial fibrillation ([place])   . C. difficile colitis 09/2015  . COPD (chronic  obstructive pulmonary disease) ([place])   . Depression   . Dyspnea   . Dysrhythmia   . Fibromyalgia   . H/O tracheostomy   . Hep C w/ coma, chronic   . Hypertension   . MRSA pneumonia ([place]) 2017  . On home oxygen therapy    3 L/M   . Osteoporosis   . Peripheral neuropathy   . RLS (restless legs syndrome)   . S/P percutaneous endoscopic gastrostomy (PEG) tube placement ([place]) 09/2015  . Ventilator associated pneumonia [facility]) 10/2015   [facility], [place]     SURGICAL HISTORY   Past Surgical History:  Procedure Laterality Date  . ABDOMINAL HYSTERECTOMY    . BREAST SURGERY Bilateral    Breast Implants  . DILATION AND CURETTAGE OF UTERUS    . REVERSE SHOULDER ARTHROPLASTY Right 03/06/2017   Procedure: REVERSE SHOULDER ARTHROPLASTY;  Surgeon: Corky Mull, MD;  Location: [facility] ORS;  Service: Orthopedics;  Laterality: Right;  . ROTATOR CUFF REPAIR Bilateral   . TRACHEOSTOMY TUBE PLACEMENT N/A 06/15/2020   Procedure: TRACHEOSTOMY;  Surgeon: Clyde Canterbury, MD;  Location: [facility] ORS;  Service: ENT;  Laterality: N/A;     FAMILY HISTORY   Family History  Problem Relation Age of Onset  . Hypertension Mother      SOCIAL HISTORY   Social History   Tobacco Use  . Smoking status: Former Smoker    Packs/day: 1.50    Types: Cigarettes    Quit date: 10/05/2015    Years since quitting: 4.7  . Smokeless tobacco: Never Used  Vaping Use  . Vaping Use: Some days  . Last attempt to quit: 10/05/2015  Substance Use Topics  . Alcohol use: No  . Drug use: No     MEDICATIONS   Current Medication:  Current Facility-Administered Medications:  .  0.9 %  sodium chloride infusion (Manually program via Guardrails IV Fluids), , Intravenous, Once, Rust-Chester, Huel Cote, NP, Held at 06/18/20 [phone] .  0.9 %  sodium chloride infusion, 250 mL, Intravenous, Continuous, Duffy Bruce, MD, Stopped at 06/14/20 0800 .  0.9 %  sodium chloride infusion, 250 mL, Intravenous, PRN, Mortimer Fries, Kurian,  MD .  0.9 %  sodium chloride infusion, 250 mL, Intravenous, Continuous, Kasa, Kurian, MD, Stopped at 06/12/20 1131 .  acetaminophen (TYLENOL) 160 MG/5ML solution 650 mg, 650 mg, Per Tube, Q4H PRN, Flora Lipps, MD .  chlorhexidine gluconate (MEDLINE KIT) (PERIDEX) 0.12 % solution 15 mL, 15 mL, Mouth Rinse, BID, Kasa, Kurian, MD, 15 mL at 06/19/20 0820 .  Chlorhexidine Gluconate Cloth 2 % PADS 6 each, 6 each, Topical, Daily, Flora Lipps, MD, 6 each at 06/18/20 1722 .  dexmedetomidine (PRECEDEX) 400 MCG/100ML (4 mcg/mL) infusion, 0.4-1.2 mcg/kg/hr, Intravenous, Titrated, Aleskerov, Fuad, MD, Last Rate: 17.94 mL/hr at 06/19/20 0825, 1.2 mcg/kg/hr at 06/19/20 0825 .  dextrose 5 % in lactated ringers infusion, , Intravenous, Continuous, Tyna Jaksch, MD, Last Rate: 75 mL/hr at 06/19/20 0816, New Bag at 06/19/20 0816 .  diazepam (VALIUM) tablet 5 mg, 5 mg, Per Tube, Q8H PRN, Rust-Chester, Britton L, NP, 5 mg at 06/15/20 2143 .  digoxin (LANOXIN) 0.25 MG/ML injection 0.125 mg, 0.125 mg, Intravenous, Daily, Lanney Gins, Fuad, MD, 0.125 mg at 06/19/20 1031 .  diltiazem (CARDIZEM) tablet 30 mg, 30 mg, Per Tube, Q6H, Belue, Alver Sorrow, RPH, 30 mg at 06/17/20 0540 .  enoxaparin (LOVENOX) injection 62.5 mg, 1 mg/kg, Subcutaneous, Q12H, Lanney Gins, Fuad, MD, 62.5 mg at 06/19/20 1247 .  feeding supplement (VITAL AF 1.2 CAL) liquid 1,000 mL, 1,000 mL, Per Tube, Continuous, Aleskerov, Fuad, MD, Last Rate: 50 mL/hr at 06/15/20 1649, 1,000 mL at 06/15/20 1649 .  fentaNYL (SUBLIMAZE) bolus via infusion 25 mcg, 25 mcg, Intravenous, Q15 min PRN, Darel Hong D, NP, 25 mcg at 06/17/20 0655 .  fentaNYL 2588mg in NS 2567m(107mml) infusion-PREMIX, 0-400 mcg/hr, Intravenous, Continuous, Kasa, Kurian, MD, Last Rate: 40 mL/hr at 06/19/20 0730, 400 mcg/hr at 06/19/20 0730 .  insulin aspart (novoLOG) injection 0-9 Units, 0-9 Units, Subcutaneous, Q4H, Rust-Chester, Britton L, NP, 1 Units at 06/19/20 1202 .   ipratropium-albuterol (DUONEB) 0.5-2.5 (3) MG/3ML nebulizer solution 3 mL, 3 mL, Nebulization, Q6H, Blakeney, Dana G, NP, 3 mL at 06/19/20 0830 .  ipratropium-albuterol (DUONEB) 0.5-2.5 (3) MG/3ML nebulizer solution 3 mL, 3 mL, Nebulization, Q6H PRN, BlaAwilda BillP .  labetalol (NORMODYNE) injection 10 mg, 10 mg, Intravenous, Q4H PRN, IzqTyna JakschD .  MEDLINE mouth rinse, 15 mL, Mouth Rinse, 10 times per day, KasFlora LippsD, 15 mL at 06/19/20 1205 .  methylPREDNISolone sodium succinate (SOLU-MEDROL) 40 mg/mL injection 40 mg, 40  mg, Intravenous, Q24H, Ottie Glazier, MD, 40 mg at 06/19/20 0828 .  metoprolol tartrate (LOPRESSOR) injection 5 mg, 5 mg, Intravenous, Q3H PRN, Ottie Glazier, MD, 5 mg at 06/17/20 2006 .  metoprolol tartrate (LOPRESSOR) injection 5 mg, 5 mg, Intravenous, Q6H, Tyna Jaksch, MD, 5 mg at 06/19/20 1202 .  midazolam (VERSED) injection 1 mg, 1 mg, Intravenous, Q1H PRN, Rust-Chester, Britton L, NP, 1 mg at 06/19/20 1154 .  ondansetron (ZOFRAN) injection 4 mg, 4 mg, Intravenous, Q6H PRN, Mortimer Fries, Kurian, MD .  pantoprazole (PROTONIX) injection 40 mg, 40 mg, Intravenous, QHS, Dallie Piles, RPH .  polyethylene glycol (MIRALAX / GLYCOLAX) packet 17 g, 17 g, Per Tube, Daily PRN, Mortimer Fries, Kurian, MD .  potassium chloride (KLOR-CON) packet 20 mEq, 20 mEq, Per Tube, Once, Dallie Piles, RPH .  sodium chloride flush (NS) 0.9 % injection 10-40 mL, 10-40 mL, Intracatheter, Q12H, Flora Lipps, MD, 10 mL at 06/19/20 0821 .  sodium chloride flush (NS) 0.9 % injection 10-40 mL, 10-40 mL, Intracatheter, PRN, Mortimer Fries, Kurian, MD .  sodium chloride flush (NS) 0.9 % injection 3 mL, 3 mL, Intravenous, Q12H, Kasa, Kurian, MD, 3 mL at 06/19/20 0905 .  sodium chloride flush (NS) 0.9 % injection 3 mL, 3 mL, Intravenous, PRN, Flora Lipps, MD    ALLERGIES   Patient has no known allergies.    REVIEW OF SYSTEMS    Unable to obtain on sedation and mechanical  ventilation  PHYSICAL EXAMINATION   Vital Signs: Temp:  [98 F (36.7 C)-100.1 F (37.8 C)] 98.1 F (36.7 C) (12/27 1236) Pulse Rate:  [63-107] 85 (12/27 1236) Resp:  [11-25] 16 (12/27 1236) BP: (103-176)/(79-122) 168/92 (12/27 1236) SpO2:  [97 %-100 %] 100 % (12/27 1236) FiO2 (%):  [28 %-60 %] 28 % (12/27 1130) Weight:  [62.3 kg] 62.3 kg (12/27 0500)  GENERAL:age appropriate sedated on MV HEAD: Normocephalic, atraumatic.  EYES: Pupils equal, round, reactive to light.  No scleral icterus.  MOUTH: Moist mucosal membrane. NECK: Supple. No thyromegaly. No nodules. No JVD.  PULMONARY: mild rhonchi CARDIOVASCULAR: S1 and S2. Regular rate and rhythm. No murmurs, rubs, or gallops.  GASTROINTESTINAL: Soft, nontender, non-distended. No masses. Positive bowel sounds. No hepatosplenomegaly.  MUSCULOSKELETAL: No swelling, clubbing, or edema.  NEUROLOGIC: GCS3T SKIN:intact,warm,dry   PERTINENT DATA     Infusions: . sodium chloride Stopped (06/14/20 0800)  . sodium chloride    . sodium chloride Stopped (06/12/20 1131)  . dexmedetomidine (PRECEDEX) IV infusion 1.2 mcg/kg/hr (06/19/20 0825)  . dextrose 5% lactated ringers 75 mL/hr at 06/19/20 0816  . feeding supplement (VITAL AF 1.2 CAL) 1,000 mL (06/15/20 1649)  . fentaNYL infusion INTRAVENOUS 400 mcg/hr (06/19/20 0730)   Scheduled Medications: . sodium chloride   Intravenous Once  . chlorhexidine gluconate (MEDLINE KIT)  15 mL Mouth Rinse BID  . Chlorhexidine Gluconate Cloth  6 each Topical Daily  . digoxin  0.125 mg Intravenous Daily  . diltiazem  30 mg Per Tube Q6H  . enoxaparin (LOVENOX) injection  1 mg/kg Subcutaneous Q12H  . insulin aspart  0-9 Units Subcutaneous Q4H  . ipratropium-albuterol  3 mL Nebulization Q6H  . mouth rinse  15 mL Mouth Rinse 10 times per day  . methylPREDNISolone (SOLU-MEDROL) injection  40 mg Intravenous Q24H  . metoprolol tartrate  5 mg Intravenous Q6H  . pantoprazole (PROTONIX) IV  40 mg  Intravenous QHS  . potassium chloride  20 mEq Per Tube Once  . sodium chloride flush  10-40 mL  Intracatheter Q12H  . sodium chloride flush  3 mL Intravenous Q12H   PRN Medications: sodium chloride, acetaminophen (TYLENOL) oral liquid 160 mg/5 mL, diazepam, fentaNYL, ipratropium-albuterol, labetalol, metoprolol tartrate, midazolam, ondansetron (ZOFRAN) IV, polyethylene glycol, sodium chloride flush, sodium chloride flush Hemodynamic parameters:   Intake/Output: 12/26 0701 - 12/27 0700 In: 1445.2 [I.V.:1075.2; Blood:370] Out: 9373 [Urine:3210; Stool:150]  Ventilator  Settings: Vent Mode: PRVC FiO2 (%):  [28 %-60 %] 28 % Set Rate:  [14 bmp] 14 bmp Vt Set:  [420 mL] 420 mL PEEP:  [5 cmH20] 5 cmH20 Pressure Support:  [16 cmH20] 16 cmH20 Plateau Pressure:  [22 cmH20] 22 cmH20   LAB RESULTS:  Basic Metabolic Panel: Recent Labs  Lab 06/15/20 0315 06/16/20 0440 06/17/20 0500 06/18/20 0414 06/19/20 0500  NA 144 146* 145 148* 142  K 4.9 4.1 4.3 3.9 3.4*  CL 102 100 99 100 97*  CO2 38* 40* 41* 39* 33*  GLUCOSE 102* 141* 158* 87 75  BUN 48* 35* 31* 24* 19  CREATININE 0.65 0.57 0.44 0.55 0.68  CALCIUM 9.0 8.7* 8.7* 8.7* 8.5*  MG 2.1 2.0 2.3 2.2 2.0  PHOS 3.1 2.9 3.0 3.1 2.8   Liver Function Tests: No results for input(s): AST, ALT, ALKPHOS, BILITOT, PROT, ALBUMIN in the last 168 hours. No results for input(s): LIPASE, AMYLASE in the last 168 hours. No results for input(s): AMMONIA in the last 168 hours. CBC: Recent Labs  Lab 06/15/20 0315 06/16/20 0440 06/17/20 0500 06/18/20 0414 06/18/20 1700 06/19/20 0500  WBC 20.9* 19.3* 14.1* 10.6*  --  9.2  HGB 7.9* 7.9* 7.6* 6.9* 8.6* 8.0*  HCT 25.9* 25.4* 25.1* 23.3* 27.4* 26.0*  MCV 102.0* 102.0* 102.9* 103.6*  --  96.3  PLT 280 256 245 203  --  171   Cardiac Enzymes: No results for input(s): CKTOTAL, CKMB, CKMBINDEX, TROPONINI in the last 168 hours. BNP: Invalid input(s): POCBNP CBG: Recent Labs  Lab 06/19/20 0001  06/19/20 0402 06/19/20 0752 06/19/20 0909 06/19/20 1141  GLUCAP 73 71 67* 94 131*       IMAGING RESULTS:  Imaging: DG Chest Port 1 View  Result Date: 06/19/2020 CLINICAL DATA:  Acute respiratory failure. Tracheostomy tube. EXAM: PORTABLE CHEST 1 VIEW COMPARISON:  Chest x-ray 06/14/2020 FINDINGS: The tracheostomy tube is in good position. Left IJ catheter is stable. The NG tube is been removed. Stable emphysematous changes and hyperinflation. No infiltrates or pneumothorax. IMPRESSION: 1. Tracheostomy tube in good position. 2. Stable emphysematous changes and hyperinflation. No infiltrates or effusions. Electronically Signed   By: Marijo Sanes M.D.   On: 06/19/2020 05:50   '@PROBHOSP' @ DG Chest Port 1 View  Result Date: 06/19/2020 CLINICAL DATA:  Acute respiratory failure. Tracheostomy tube. EXAM: PORTABLE CHEST 1 VIEW COMPARISON:  Chest x-ray 06/14/2020 FINDINGS: The tracheostomy tube is in good position. Left IJ catheter is stable. The NG tube is been removed. Stable emphysematous changes and hyperinflation. No infiltrates or pneumothorax. IMPRESSION: 1. Tracheostomy tube in good position. 2. Stable emphysematous changes and hyperinflation. No infiltrates or effusions. Electronically Signed   By: Marijo Sanes M.D.   On: 06/19/2020 05:50     ASSESSMENT AND PLAN    -Multidisciplinary rounds held today  Acute Hypoxic Respiratory Failure -FiO2 is at 30% today -AECOPD  -continue Full MV support -continue Bronchodilator Therapy -Wean Fio2 and PEEP as tolerated -will perform SAT/SBT when respiratory parameters are met -s/p KUB - OGT advanced as indicated by radiologist -s/p Tracheostomy -Do trach collar for 1 hour increments  over then 24-48 hours evey 12 hours.    Acute on chronic diastolic CHF  - patient is on levophed and metoprolol - will get rid of levophed and use OGT midodrine for vasopressor support   GI/Nutrition GI PROPHYLAXIS as indicated.  DIET-->TF's as  tolerated Constipation protocol as indicated -Will consult GI for possible G tube.   ENDO - ICU hypoglycemic\Hyperglycemia protocol -check FSBS per protocol   ELECTROLYTES -follow labs as needed -replace as needed -pharmacy consultation   DVT/GI PRX ordered -SCDs  TRANSFUSIONS AS NEEDED MONITOR FSBS ASSESS the need for LABS as needed   Critical care provider statement:    Critical care time (minutes):  35   Critical care time was exclusive of:  Separately billable procedures and treating other patients   Critical care was necessary to treat or prevent imminent or life-threatening deterioration of the following conditions:  acute hypoxemic respiratory failure, AECOPD, multiple comorbid conditions   Critical care was time spent personally by me on the following activities:  Development of treatment plan with patient or surrogate, discussions with consultants, evaluation of patient's response to treatment, examination of patient, obtaining history from patient or surrogate, ordering and performing treatments and interventions, ordering and review of laboratory studies and re-evaluation of patient's condition.  I assumed direction of critical care for this patient from another provider in my specialty: no    This document was prepared using Dragon voice recognition software and may include unintentional dictation errors.    Newell Coral DO Internal Medicine/Pediatrics Pulmonary and Critical Care Fellow PGY-7

## 2020-06-19 NOTE — Progress Notes (Signed)
Inpatient Diabetes Program Recommendations  AACE/ADA: New Consensus Statement on Inpatient Glycemic Control (2015)  Target Ranges:  Prepandial:   less than 140 mg/dL      Peak postprandial:   less than 180 mg/dL (1-2 hours)      Critically ill patients:  140 - 180 mg/dL   Lab Results  Component Value Date   GLUCAP 67 (L) 06/19/2020   HGBA1C 5.3 06/06/2020    Review of Glycemic Control  Results for Virginia [name], Virginia [name] (MRN [id number]) as of 06/19/2020 10:03  Ref. Range 06/18/2020 11:14 06/18/2020 15:37 06/18/2020 19:21 06/19/2020 00:01 06/19/2020 07:52  Glucose-Capillary Latest Ref Range: 70 - 99 mg/dL 85 224 (H) 825 (H) 73 67 (L)   Current orders for Inpatient glycemic control:  Novolog 0-9 units Q4H Solumedrol 40 mg daily  Inpatient Diabetes Program Recommendations:    Novolog 0-6 units Q4H  Will continue to follow while inpatient.  Thank you, Dulce Sellar, RN, BSN Diabetes Coordinator Inpatient Diabetes Program [phone] (team pager from 8a-5p)

## 2020-06-19 NOTE — Progress Notes (Addendum)
SLP Cancellation Note  Patient Details Name: Virginia [name] MRN: [id number] DOB: [birth year]   Cancelled treatment:       Reason Eval/Treat Not Completed: Patient not medically ready;Medical issues which prohibited therapy (chart reviewed; consulted NSG then met w/ pt) Pt is currently back on vent support; tracheostomy performed on 12.23.2021. She was able to tolerate a TC wean this AM for ~3 hours, but NSG reported need for return to vent and medications to calm Pulmonary status/anxiousness about her breathing. Pt is alert, mouthing for communication. Daughter present. This SLP explained to Dtr, pt that it would be best if she could be on TC w/ PMV on place for any/all po's for safer swallowing. Discussed the demands on swallowing including breathing; risks for aspiration w/ oral intake on vent, tracheostomy. They agreed and will wait to see how her next TC weaning goes next 1-2 days w/ ST services to f/u for PMV assessment hopefully and then BSE, hopefully. Will update MD. NSG updated. Recommended and instructed on frequent oral care for hygiene and stimulation of swallowing. Daughter agreed w/ above.     Orinda Kenner, MS, CCC-SLP Speech Language Pathologist Rehab Services [phone] [facility] 06/19/2020, 12:36 PM

## 2020-06-19 NOTE — Consult Note (Signed)
Consultation  Referring Provider:  Dr. Orpah Melter Admit date: 12/14 Consult date: 12/27         Reason for Consultation:     PEG tube placement         HPI:   Virginia [name] is a 69 y.o. lady with severe COPD with history of exacerbation requiring trach/peg in [place] in 2017 who is admitted with another COPD exacerbation and is now s/p trach due to inability to wean off of vent. We have been consulted for consideration of PEG placement. Patient with history of untreated hepatitis C but has not had labs done to check on this in a long time. She has normal platelets and albumin. On CT abdomen done in 2017, had normal appearing liver. Abdominal exam with no significant scars. Has history of hysterectomy.  Past Medical History:  Diagnosis Date  . Anemia   . Anxiety   . Arthritis   . Atrial fibrillation ([place])   . C. difficile colitis 09/2015  . COPD (chronic obstructive pulmonary disease) ([place])   . Depression   . Dyspnea   . Dysrhythmia   . Fibromyalgia   . H/O tracheostomy   . Hep C w/ coma, chronic   . Hypertension   . MRSA pneumonia ([place]) 2017  . On home oxygen therapy    3 L/M   . Osteoporosis   . Peripheral neuropathy   . RLS (restless legs syndrome)   . S/P percutaneous endoscopic gastrostomy (PEG) tube placement ([place]) 09/2015  . Ventilator associated pneumonia [facility]) 10/2015   [facility], [place]    Past Surgical History:  Procedure Laterality Date  . ABDOMINAL HYSTERECTOMY    . BREAST SURGERY Bilateral    Breast Implants  . DILATION AND CURETTAGE OF UTERUS    . REVERSE SHOULDER ARTHROPLASTY Right 03/06/2017   Procedure: REVERSE SHOULDER ARTHROPLASTY;  Surgeon: Corky Mull, MD;  Location: [facility] ORS;  Service: Orthopedics;  Laterality: Right;  . ROTATOR CUFF REPAIR Bilateral   . TRACHEOSTOMY TUBE PLACEMENT N/A 06/15/2020   Procedure: TRACHEOSTOMY;  Surgeon: Clyde Canterbury, MD;  Location: [facility] ORS;  Service: ENT;  Laterality: N/A;    Family History   Problem Relation Age of Onset  . Hypertension Mother     Social History   Tobacco Use  . Smoking status: Former Smoker    Packs/day: 1.50    Types: Cigarettes    Quit date: 10/05/2015    Years since quitting: 4.7  . Smokeless tobacco: Never Used  Vaping Use  . Vaping Use: Some days  . Last attempt to quit: 10/05/2015  Substance Use Topics  . Alcohol use: No  . Drug use: No    Prior to Admission medications   Medication Sig Start Date End Date Taking? Authorizing Provider  albuterol (VENTOLIN HFA) 108 (90 Base) MCG/ACT inhaler Inhale 2 puffs into the lungs every 4 (four) hours as needed for wheezing or shortness of breath.   Yes [provider]  amitriptyline (ELAVIL) 25 MG tablet Take 25 mg by mouth at bedtime.  01/14/19  Yes [provider]  diazepam (VALIUM) 5 MG tablet Take 5 mg by mouth every 8 (eight) hours as needed for anxiety.  10/22/16  Yes [provider]  diltiazem (CARDIZEM CD) 120 MG 24 hr capsule Take 120 mg by mouth daily. 04/29/19  Yes [provider]  estradiol (ESTRACE) 1 MG tablet Take 1 mg by mouth daily. 09/10/16  Yes [provider]  furosemide (LASIX) 20 MG tablet Take  20 mg by mouth daily.  10/01/16  Yes [provider]  gabapentin (NEURONTIN) 300 MG capsule Take 300 mg by mouth 3 (three) times daily. 05/06/20  Yes [provider]  lisinopril (ZESTRIL) 10 MG tablet Take 10 mg by mouth daily.   Yes [provider]  MAGNESIUM-OXIDE 400 (241.3 Mg) MG tablet Take 400 mg by mouth daily.  08/24/16  Yes [provider]  metoprolol tartrate (LOPRESSOR) 50 MG tablet Take 50 mg by mouth 2 (two) times daily. 04/23/20  Yes [provider]  Multiple Vitamin (MULTIVITAMIN) tablet Take 1 tablet by mouth daily.   Yes [provider]  omeprazole (PRILOSEC) 40 MG capsule Take 40 mg by mouth daily. 04/16/19  Yes [provider]  oxyCODONE-acetaminophen (PERCOCET) 10-325 MG  tablet Take 1 tablet by mouth 3 (three) times daily. 05/03/20  Yes [provider]  predniSONE (DELTASONE) 5 MG tablet Take 5 mg by mouth daily. 05/23/19  Yes [provider]  QUEtiapine (SEROQUEL) 25 MG tablet Take 25 mg by mouth at bedtime.  10/01/16  Yes [provider]  TRELEGY ELLIPTA 100-62.5-25 MCG/INH AEPB Inhale 1 puff into the lungs daily. 06/04/20  Yes [provider]  apixaban (ELIQUIS) 5 MG TABS tablet Take 1 tablet (5 mg total) by mouth 2 (two) times daily. 11/10/16   Bettey Costa, MD  predniSONE (DELTASONE) 10 MG tablet Take 4 tablets (40 mg total) by mouth daily. 06/04/20   Wouk, Ailene Rud, MD    Current Facility-Administered Medications  Medication Dose Route Frequency Provider Last Rate Last Admin  . 0.9 %  sodium chloride infusion (Manually program via Guardrails IV Fluids)   Intravenous Once Rust-Chester, Huel Cote, NP   Held at 06/18/20 [phone]  . 0.9 %  sodium chloride infusion  250 mL Intravenous Continuous Duffy Bruce, MD   Stopped at 06/14/20 0800  . 0.9 %  sodium chloride infusion  250 mL Intravenous PRN Flora Lipps, MD      . 0.9 %  sodium chloride infusion  250 mL Intravenous Continuous Flora Lipps, MD   Stopped at 06/12/20 1131  . acetaminophen (TYLENOL) 160 MG/5ML solution 650 mg  650 mg Per Tube Q4H PRN Flora Lipps, MD      . chlorhexidine gluconate (MEDLINE KIT) (PERIDEX) 0.12 % solution 15 mL  15 mL Mouth Rinse BID Flora Lipps, MD   15 mL at 06/19/20 0820  . Chlorhexidine Gluconate Cloth 2 % PADS 6 each  6 each Topical Daily Flora Lipps, MD   6 each at 06/18/20 1722  . dexmedetomidine (PRECEDEX) 400 MCG/100ML (4 mcg/mL) infusion  0.4-1.2 mcg/kg/hr Intravenous Titrated Ottie Glazier, MD 17.94 mL/hr at 06/19/20 0825 1.2 mcg/kg/hr at 06/19/20 0825  . dextrose 5 % in lactated ringers infusion   Intravenous Continuous Tyna Jaksch, MD 75 mL/hr at 06/19/20 0816 New Bag at 06/19/20 0816  . diazepam (VALIUM) tablet 5 mg  5  mg Per Tube Q8H PRN Rust-Chester, Huel Cote, NP   5 mg at 06/15/20 2143  . digoxin (LANOXIN) 0.25 MG/ML injection 0.125 mg  0.125 mg Intravenous Daily Ottie Glazier, MD   0.125 mg at 06/19/20 1031  . diltiazem (CARDIZEM) tablet 30 mg  30 mg Per Tube Q6H BelueAlver Sorrow, RPH   30 mg at 06/17/20 0540  . enoxaparin (LOVENOX) injection 62.5 mg  1 mg/kg Subcutaneous Q12H Ottie Glazier, MD   62.5 mg at 06/19/20 1247  . feeding supplement (VITAL AF 1.2 CAL) liquid 1,000 mL  1,000 mL Per Tube Continuous Ottie Glazier, MD 50 mL/hr at 06/15/20 1649 1,000 mL at 06/15/20 1649  . fentaNYL (SUBLIMAZE) bolus via infusion 25 mcg  25 mcg Intravenous Q15 min PRN Bradly Bienenstock, NP   25 mcg at 06/17/20 0655  . fentaNYL 2555mg in NS 2589m(10103mml) infusion-PREMIX  0-400 mcg/hr Intravenous Continuous KasFlora LippsD 40 mL/hr at 06/19/20 0730 400 mcg/hr at 06/19/20 0730  . insulin aspart (novoLOG) injection 0-9 Units  0-9 Units Subcutaneous Q4H Rust-Chester, Britton L, NP   1 Units at 06/19/20 1202  . ipratropium-albuterol (DUONEB) 0.5-2.5 (3) MG/3ML nebulizer solution 3 mL  3 mL Nebulization Q6H BlaAwilda BillP   3 mL at 06/19/20 0830  . ipratropium-albuterol (DUONEB) 0.5-2.5 (3) MG/3ML nebulizer solution 3 mL  3 mL Nebulization Q6H PRN BlaAwilda BillP      . labetalol (NORMODYNE) injection 10 mg  10 mg Intravenous Q4H PRN IzqTyna JakschD      . MEDLINE mouth rinse  15 mL Mouth Rinse 10 times per day KasFlora LippsD   15 mL at 06/19/20 1205  . methylPREDNISolone sodium succinate (SOLU-MEDROL) 40 mg/mL injection 40 mg  40 mg Intravenous Q24H AleOttie GlazierD   40 mg at 06/19/20 0828  . metoprolol tartrate (LOPRESSOR) injection 5 mg  5 mg Intravenous Q3H PRN AleOttie GlazierD   5 mg at 06/17/20 2006  . metoprolol tartrate (LOPRESSOR) injection 5 mg  5 mg Intravenous Q6H IzqTyna JakschD   5 mg at 06/19/20 1202  . midazolam (VERSED) injection 1 mg  1 mg Intravenous Q1H PRN  Rust-Chester, Britton L, NP   1 mg at 06/19/20 1154  . ondansetron (ZOFRAN) injection 4 mg  4 mg Intravenous Q6H PRN KasFlora LippsD      . pantoprazole (PROTONIX) injection 40 mg  40 mg Intravenous QHS GruDallie PilesPH      . polyethylene glycol (MIRALAX / GLYCOLAX) packet 17 g  17 g Per Tube Daily PRN KasFlora LippsD      . potassium chloride (KLOR-CON) packet 20 mEq  20 mEq Per Tube Once GruDallie PilesPH      . sodium chloride flush (NS) 0.9 % injection 10-40 mL  10-40 mL Intracatheter Q12H KasFlora LippsD   10 mL at 06/19/20 0821  . sodium chloride flush (NS) 0.9 % injection 10-40 mL  10-40 mL Intracatheter PRN KasFlora LippsD      . sodium chloride flush (NS) 0.9 % injection 3 mL  3 mL Intravenous Q12H KasFlora LippsD   3 mL at 06/19/20 0905  . sodium chloride flush (NS) 0.9 % injection 3 mL  3 mL Intravenous PRN KasFlora LippsD        Allergies as of 06/06/2020  . (No Known Allergies)     Review of Systems:    All systems reviewed and negative except where noted in HPI.  Unable to assess as patient with trach    Physical Exam:  Vital signs in last 24 hours: Temp:  [98 F (36.7 C)-100.1 F (37.8 C)] 98.1 F (36.7 C) (12/27 1236) Pulse Rate:  [63-107] 85 (12/27 1236) Resp:  [11-25] 16 (12/27 1236) BP: (103-176)/(79-122) 168/92 (12/27 1236) SpO2:  [97 %-100 %] 100 % (12/27 1236) FiO2 (%):  [28 %-60 %] 28 % (12/27 1130) Weight:  [62.3 kg] 62.3 kg (12/27 0500) Last BM Date: 06/18/20 General:   Pleasant in NAD Head:  Normocephalic and atraumatic. Eyes:   No icterus.   Conjunctiva pink. Mouth: Mucosa pink moist, no lesions. Neck:  Supple; no masses felt Lungs:  On vent via trach Heart:  RRR Abdomen:   Flat, soft, nondistended, nontender Msk:   Symmetrical without gross deformities. Neurologic:  No focal deficits Skin:  Warm, dry, pink without significant lesions or rashes. Psych:  Alert and cooperative. Normal affect.  LAB RESULTS: Recent Labs     06/17/20 0500 06/18/20 0414 06/18/20 1700 06/19/20 0500  WBC 14.1* 10.6*  --  9.2  HGB 7.6* 6.9* 8.6* 8.0*  HCT 25.1* 23.3* 27.4* 26.0*  PLT 245 203  --  171   BMET Recent Labs    06/17/20 0500 06/18/20 0414 06/19/20 0500  NA 145 148* 142  K 4.3 3.9 3.4*  CL 99 100 97*  CO2 41* 39* 33*  GLUCOSE 158* 87 75  BUN 31* 24* 19  CREATININE 0.44 0.55 0.68  CALCIUM 8.7* 8.7* 8.5*   LFT No results for input(s): PROT, ALBUMIN, AST, ALT, ALKPHOS, BILITOT, BILIDIR, IBILI in the last 72 hours. PT/INR No results for input(s): LABPROT, INR in the last 72 hours.  STUDIES: DG Chest Port 1 View  Result Date: 06/19/2020 CLINICAL DATA:  Acute respiratory failure. Tracheostomy tube. EXAM: PORTABLE CHEST 1 VIEW COMPARISON:  Chest x-ray 06/14/2020 FINDINGS: The tracheostomy tube is in good position. Left IJ catheter is stable. The NG tube is been removed. Stable emphysematous changes and hyperinflation. No infiltrates or pneumothorax. IMPRESSION: 1. Tracheostomy tube in good position. 2. Stable emphysematous changes and hyperinflation. No infiltrates or effusions. Electronically Signed   By: Marijo Sanes M.D.   On: 06/19/2020 05:50       Impression / Plan:   69 y/o lady with severe COPD requiring prolonged trach went for which we are consulted for PEG placement. Patient has previously had PEG placed for similar issue. Will tentatively plan for PEG placement on Wednesday afternoon  - tentatively will plan on PEG for Wednesday afternoon - please hold lovenox after tomorrow morning's dose  Please call with any questions or concerns.  Raylene Miyamoto MD, MPH [place]

## 2020-06-19 NOTE — Progress Notes (Signed)
Nutrition Follow-up  DOCUMENTATION CODES:   Not applicable  INTERVENTION:  Once PEG tube placed and ready to use recommend initiating Jevity 1.2 Cal at 60 mL/hr (1440 ml goal daily volume) + PROSource TF 45 mL once daily per tube. Provides 1768 kcal, 91 grams of protein, 26 grams of fiber, 1166 mL H2O daily.  Recommend free water flush of 90 mL Q4hrs. Provides a total of 1706 mL H2O daily including water in TF regimen.  Tube feed regimen can be changed to bolus vs overnight feeds pending plan of care regarding vent status and ability to begin PO diet after working with SLP.  NUTRITION DIAGNOSIS:   Increased nutrient needs related to catabolic illness (COPD) as evidenced by estimated needs.  Ongoing.  GOAL:   Patient will meet greater than or equal to 90% of their needs  Not met.  MONITOR:   PO intake,Supplement acceptance,Labs,Weight trends,I & O's  REASON FOR ASSESSMENT:   Ventilator,Consult Assessment of nutrition requirement/status,Enteral/tube feeding initiation and management  ASSESSMENT:   69 year old female with PMHx of COPD, hx tracheostomy tube placement and PEG tube placement in 2017, HTN, depression, RLS, osteoporosis, hepatitis C, A-fib, anxiety, arthritis admitted with acute on chronic hypoxic hypercapnic respiratory failure and acute encephalopathy secondary to narcotic use and acute exacerbation of COPD.  12/14 intubated 12/16 extubated 12/18 reintubated and TFs restarted 12/23 s/p tracheostomy tube placement 12/23 Dobbhoff tube was placed after patient returned from trach and TFs were restarted 12/25 pt pulled out Dobbhoff tube and refused replacement  Patient is currently on vent support via trach MV: 9.2 L/min Temp (24hrs), Avg:98.2 F (36.8 C), Min:98 F (36.7 C), Max:98.5 F (36.9 C)  Medications reviewed and include: Novolog 0-6 units Q4hrs, Solu-Medrol 20 mg Q24 hrs, Protonix, Precedex gtt, D5 in LR at 75 mL/hr, fentanyl gtt.  Labs reviewed:  CBG 67-131, Potassium 3.4, Chloride 97, CO2 33.  I/O: 3210 mL UOP yesterday (2.1 ml/kg/hr)  Weight trend: 62.3 kg on 12/27; +3.4 kg from 12/14  Patient alert during RD assessment this AM and able to mouth words. She reports her mouth is dry. RD provided patient lemon mouth swabs after checking with RN and assisted patient with using.  Discussed with RN and on rounds. Patient and family refusing replacement of NGT. She is working on weaning to [employer] but only tolerated 3 hours today so was unable to work with SLP on [place] as she was back on vent support. Plan is now to consult GI regarding PEG tube placement.  Diet Order:   Diet Order            Diet NPO time specified  Diet effective now                EDUCATION NEEDS:   No education needs have been identified at this time  Skin:  Skin Assessment: Skin Integrity Issues: Skin Integrity Issues:: Incisions Incisions: closed incision to neck  Last BM:  06/19/2020 - small type 7  Height:   Ht Readings from Last 1 Encounters:  06/06/20 5' 2.01" (1.575 m)   Weight:   Wt Readings from Last 1 Encounters:  06/19/20 62.3 kg   Ideal Body Weight:  50 kg  BMI:  Body mass index is 25.11 kg/m.  Estimated Nutritional Needs:   Kcal:  1500-1750  Protein:  85-95 grams  Fluid:  1.5-1.7 L/day  Jacklynn Barnacle, MS, RD, LDN Pager number available on Amion

## 2020-06-19 NOTE — Progress Notes (Signed)
Patient alert and follows simple commands. Trach collar tolerated for about 2 hours. Patient became very agitated with labored breathing. GI consult entered for peg tube placement. Foley intact with adequate urine output. Flexi-seal intact with about 50 ml of stool. Continue to monitor.

## 2020-06-20 DIAGNOSIS — Z515 Encounter for palliative care: Secondary | ICD-10-CM | POA: Diagnosis not present

## 2020-06-20 DIAGNOSIS — J9601 Acute respiratory failure with hypoxia: Secondary | ICD-10-CM | POA: Diagnosis not present

## 2020-06-20 DIAGNOSIS — Z7189 Other specified counseling: Secondary | ICD-10-CM | POA: Diagnosis not present

## 2020-06-20 DIAGNOSIS — J9602 Acute respiratory failure with hypercapnia: Secondary | ICD-10-CM | POA: Diagnosis not present

## 2020-06-20 LAB — CBC
HCT: 25 % — ABNORMAL LOW (ref 36.0–46.0)
Hemoglobin: 7.7 g/dL — ABNORMAL LOW (ref 12.0–15.0)
MCH: 29.7 pg (ref 26.0–34.0)
MCHC: 30.8 g/dL (ref 30.0–36.0)
MCV: 96.5 fL (ref 80.0–100.0)
Platelets: 151 10*3/uL (ref 150–400)
RBC: 2.59 MIL/uL — ABNORMAL LOW (ref 3.87–5.11)
RDW: 16.3 % — ABNORMAL HIGH (ref 11.5–15.5)
WBC: 8.3 10*3/uL (ref 4.0–10.5)
nRBC: 0 % (ref 0.0–0.2)

## 2020-06-20 LAB — BASIC METABOLIC PANEL
Anion gap: 7 (ref 5–15)
BUN: 13 mg/dL (ref 8–23)
CO2: 36 mmol/L — ABNORMAL HIGH (ref 22–32)
Calcium: 8.3 mg/dL — ABNORMAL LOW (ref 8.9–10.3)
Chloride: 99 mmol/L (ref 98–111)
Creatinine, Ser: 0.55 mg/dL (ref 0.44–1.00)
GFR, Estimated: >60 mL/min (ref >60)
Glucose, Bld: 128 mg/dL — ABNORMAL HIGH (ref 70–99)
Potassium: 3.4 mmol/L — ABNORMAL LOW (ref 3.5–5.1)
Sodium: 142 mmol/L (ref 135–145)

## 2020-06-20 LAB — GLUCOSE, CAPILLARY
Glucose-Capillary: 112 mg/dL — ABNORMAL HIGH (ref 70–99)
Glucose-Capillary: 107 mg/dL — ABNORMAL HIGH (ref 70–99)
Glucose-Capillary: 152 mg/dL — ABNORMAL HIGH (ref 70–99)
Glucose-Capillary: 93 mg/dL (ref 70–99)
Glucose-Capillary: 123 mg/dL — ABNORMAL HIGH (ref 70–99)

## 2020-06-20 LAB — PHOSPHORUS: Phosphorus: 3.5 mg/dL (ref 2.5–4.6)

## 2020-06-20 LAB — MAGNESIUM: Magnesium: 1.8 mg/dL (ref 1.7–2.4)

## 2020-06-20 LAB — PROTIME-INR
INR: 1 (ref 0.8–1.2)
Prothrombin Time: 12.7 seconds (ref 11.4–15.2)

## 2020-06-20 MED ORDER — MAGNESIUM SULFATE 2 GM/50ML IV SOLN
2.0000 g | Freq: Once | INTRAVENOUS | Status: AC
  Administered 2020-06-20: 2 g via INTRAVENOUS
  Filled 2020-06-20: qty 50

## 2020-06-20 MED ORDER — POTASSIUM CHLORIDE 10 MEQ/100ML IV SOLN
10.0000 meq | INTRAVENOUS | Status: AC
  Administered 2020-06-20 (×4): 10 meq via INTRAVENOUS
  Filled 2020-06-20 (×4): qty 100

## 2020-06-20 NOTE — Care Plan (Signed)
Patient seen and procedure discussed. Plan for PEG tube tomorrow. Time TBD. Will order antibiotics to be at bedside in the prior to procedure. Endo will coordinate start time of antibiotics.  Please call with any questions or concerns.  Merlyn Lot MD, MPH [facility] GI

## 2020-06-20 NOTE — Consult Note (Signed)
PHARMACY CONSULT NOTE  Pharmacy Consult for Electrolyte Monitoring and Replacement   Recent Labs: Potassium (mmol/L)  Date Value  06/20/2020 3.4 (L)   Magnesium (mg/dL)  Date Value  55/37/4827 1.8   Calcium (mg/dL)  Date Value  07/86/7544 8.3 (L)   Albumin (g/dL)  Date Value  92/06/69 3.0 (L)   Phosphorus (mg/dL)  Date Value  21/97/5883 3.5   Sodium (mmol/L)  Date Value  06/20/2020 142   Assessment: 69 yo female admitted with acute on chronic hypoxic hypercapnic respiratory failure and acute encephalopathy secondary to narcotic use, AECOPD requiring mechanical intubation. Patient is pending PEG tube placement which is tentatively planned for 12/29.  MIVF: D5LR at 75 mL/hr  Goal of Therapy:  Potassium 4.0 - 5.1 mmol/L Magnesium 2.0 - 2.4 mg/dL All Other Electrolytes within normal limits  Plan:   K 3.4 today. Un-changed despite 40 mEq IV potassium yesterday. Provider has ordered repeat IV KCl 10 mEq x 4 today  Mg 1.8. Will order IV magnesium sulfate 2 g x 1  Next labs 12/29 AM  Tressie Ellis 06/20/2020 12:08 PM

## 2020-06-20 NOTE — Progress Notes (Signed)
Daily Progress Note   Patient Name: Virginia [name]       Date: 06/20/2020 DOB: [birth year]  Age: 69 y.o. MRN#: [id number] Attending Physician: Ottie Glazier, MD Primary Care Physician: Idelle Crouch, MD Admit Date: 06/06/2020  Reason for Consultation/Follow-up: Establishing goals of care  Subjective: Patient is resting in bed with tracheostomy and TC in place.  No family at bedside.  Patient states that she decided previously that she would not want a tracheostomy.  She states that she was afraid when she came in, and was not sure what to do.  We discussed care moving forward.  She states that she had an acceptable quality of life prior to this hospitalization, and if she could return to that quality of life she would be okay with what ever means to get there.  She states that at this time she would be amenable to a feeding tube.  She would like to continue full code full scope.  Length of Stay: 14  Current Medications: Scheduled Meds:  . sodium chloride   Intravenous Once  . chlorhexidine gluconate (MEDLINE KIT)  15 mL Mouth Rinse BID  . Chlorhexidine Gluconate Cloth  6 each Topical Daily  . insulin aspart  0-6 Units Subcutaneous Q4H  . ipratropium-albuterol  3 mL Nebulization Q6H  . mouth rinse  15 mL Mouth Rinse 10 times per day  . methylPREDNISolone (SOLU-MEDROL) injection  20 mg Intravenous Q24H   Followed by  . [START ON 06/27/2020] methylPREDNISolone (SOLU-MEDROL) injection  10 mg Intravenous Q24H  . metoprolol tartrate  5 mg Intravenous Q6H  . pantoprazole (PROTONIX) IV  40 mg Intravenous QHS  . sodium chloride flush  10-40 mL Intracatheter Q12H  . sodium chloride flush  3 mL Intravenous Q12H    Continuous Infusions: . sodium chloride Stopped (06/14/20 0800)  . sodium  chloride    . sodium chloride Stopped (06/12/20 1131)  . dexmedetomidine (PRECEDEX) IV infusion 1.2 mcg/kg/hr (06/20/20 1143)  . dextrose 5% lactated ringers 75 mL/hr at 06/20/20 1113  . fentaNYL infusion INTRAVENOUS 400 mcg/hr (06/20/20 1112)  . magnesium sulfate bolus IVPB    . potassium chloride 10 mEq (06/20/20 1250)    PRN Meds: sodium chloride, acetaminophen (TYLENOL) oral liquid 160 mg/5 mL, diazepam, fentaNYL, ipratropium-albuterol, labetalol, metoprolol tartrate, midazolam, ondansetron (ZOFRAN) IV, polyethylene glycol,  sodium chloride flush, sodium chloride flush  Physical Exam Pulmonary:     Comments: Tracheostomy with trach collar in place Skin:    General: Skin is warm and dry.  Neurological:     Mental Status: She is alert.             Vital Signs: BP (!) 162/89   Pulse 68   Temp 99.1 F (37.3 C)   Resp 13   Ht 5' 2.01" (1.575 m)   Wt 58.6 kg   SpO2 100%   BMI 23.62 kg/m  SpO2: SpO2: 100 % O2 Device: O2 Device: Tracheostomy Collar O2 Flow Rate: O2 Flow Rate (L/min): 5 L/min  Intake/output summary:   Intake/Output Summary (Last 24 hours) at 06/20/2020 1257 Last data filed at 06/20/2020 0600 Gross per 24 hour  Intake 2903.94 ml  Output 1025 ml  Net 1878.94 ml   LBM: Last BM Date: 06/18/20 Baseline Weight: Weight: 58.9 kg Most recent weight: Weight: 58.6 kg       Palliative Assessment/Data:    Flowsheet Rows   Flowsheet Row Most Recent Value  Intake Tab   Referral Department Hospitalist  Unit at Time of Referral ICU  Palliative Care Primary Diagnosis Pulmonary  Date Notified 06/16/20  Palliative Care Type New Palliative care  Reason for referral Clarify Goals of Care  Date of Admission 06/06/20  Date first seen by Palliative Care 06/16/20  # of days Palliative referral response time 0 Day(s)  # of days IP prior to Palliative referral 10  Clinical Assessment   Palliative Performance Scale Score 20%  Pain Max last 24 hours Not able to  report  Pain Min Last 24 hours Not able to report  Dyspnea Max Last 24 Hours Not able to report  Dyspnea Min Last 24 hours Not able to report  Psychosocial & Spiritual Assessment   Palliative Care Outcomes       Patient Active Problem List   Diagnosis Date Noted  . COPD exacerbation ([place])   . Respiratory failure ([place]) 06/06/2020  . Acute respiratory failure with hypoxia ([place]) 06/02/2020  . COPD with acute exacerbation ([place]) 06/02/2020  . Shortness of breath 06/02/2020  . GERD (gastroesophageal reflux disease) 06/02/2020  . Iron deficiency anemia 01/22/2020  . Anemia 01/17/2019  . Coronary artery disease involving native coronary artery of native heart 04/30/2018  . Palpitations 04/30/2018  . Chronic heartburn 04/12/2018  . Other dysphagia 04/12/2018  . Thoracic aortic aneurysm without rupture ([place]) 08/27/2017  . 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 SOBOE (shortness of breath on exertion) 01/24/2017  . Paroxysmal A-fib ([place]) 11/09/2016  . Chronic hypoxemic respiratory failure ([place]) 12/06/2015  . Depression with anxiety 12/06/2015  . Fibromyalgia 12/06/2015  . Hep C w/o coma, chronic ([place]) 12/06/2015  . History of tracheostomy 12/06/2015  . HTN, goal below 140/80 12/06/2015  . Localized osteoporosis without current pathological fracture 12/06/2015  . PEG (percutaneous endoscopic gastrostomy) status ([place]) 12/06/2015  . Peripheral neuropathy, idiopathic 12/06/2015  . RLS (restless legs syndrome) 12/06/2015  . Substance abuse in remission ([place]) 12/06/2015  . COPD (chronic obstructive pulmonary disease) ([place]) 11/29/2015    Palliative Care Assessment & Plan    Recommendations/Plan: Full code full scope  Recommend palliative to follow at discharge  Code Status:    Code Status Orders  (From admission, onward)         Start     Ordered   06/06/20 1430  Full code  Continuous  06/06/20 1434        Code Status History    Date  Active Date Inactive Code Status Order ID Comments User Context   06/02/2020 2233 06/04/2020 2138 Full Code [id number]  Rhetta Mura DO ED   03/06/2017 1238 03/07/2017 2049 Full Code [id number]  Corky Mull, MD Inpatient   11/09/2016 2235 11/10/2016 1625 Full Code [id number]  Dustin Flock, MD Inpatient   Advance Care Planning Activity      Prognosis:  Unable to determine    Care plan was discussed with RN  Thank you for allowing the Palliative Medicine Team to assist in the care of this patient.   Total Time 20 min Prolonged Time Billed  no      Greater than 50%  of this time was spent counseling and coordinating care related to the above assessment and plan.  Asencion Gowda, NP  Please contact Palliative Medicine Team phone at [phone] for questions and concerns.

## 2020-06-20 NOTE — Progress Notes (Signed)
Patient alert but times of intermittent agitation and confusion. Patient placed on trach collar for about 2 hours today. Patient became short of breath, labored breathing and accessory muscle use. Placed back on vent support. Foley intact and rectal tube. Replaced potassium and magnesium ordered. Son called this afternoon to obtain consent for PEG tube placement. Continue to monitor.

## 2020-06-20 NOTE — Progress Notes (Signed)
CRITICAL CARE PROGRESS NOTE    Name: Virginia [name] MRN: [id number] DOB: [birth year]     LOS: 29   SUBJECTIVE FINDINGS & SIGNIFICANT EVENTS    Patient description:  69 yo female with severe COPD admitted with acute on chronic hypoxic hypercapnic respiratory failure and acute encephalopathy secondary to narcotic use and AECOPD requiring mechanical intubation  12/14 admitted to ICU for COPD exacerbation 12/15 plan for SAT/SBT 12/16 failed SAT/SBT, will try again today 12/16 s/p extubation to BiPAP 12/17 resp status stilltenuous 12/18 severe resp failure high risk for intubation 12/18 Re intubated for severe COPD 12/19 severe resp  Failure, ENT consulted for trach  06/12/20- sedated to RASS-2.  Plan for trache as per previous discussion with critical care team and family. DCd amio gtt today . Weaning solumedrol to 40 daily from BID. Patient on Levophed and metoprolol we will remove arrythmogenic vasopressor today.   12/21- Trache postponed to 12/23 due to anticoagulation. Patient weaned down to 21% will attempt SBT again today- patient failed with severe hypercapnia, plan continues to be for trache. Family updated today. Poke with Judeen Hammans DIL.  12/22- no events overnight. Plan for trache in am.   12/23- pt s/p trache and OGT for nourishment. Patient is able to respond appropriately to verbal communication.  12/24- patient had episodes of SVT today.  She is on scheduled bb and is in NSR now.   06/17/20- patient unchanged without overnight events. Weaning FIo2 on ventilator today.  06/18/20- patient is awake and communcative on fentanyl and precedex RASS-0.  I spoke to son POA and after speaking with him and they wish to have PEG placed. Will call GI for consult.    12/27-confirmed family would like PEG.  Patient lasted 2 hours on trach collar trial  06-20-20-patient able to tolerate trach collar trial for at least 2 hours and no increased work of breathing.   Lines/tubes : Airway 7.5 mm (Active)  Secured at (cm) 22 cm 06/12/20 0807  Measured From Lips 06/12/20 0807  Secured Location Left 06/12/20 0807  Secured By [employer] 06/12/20 0807  Tube Holder Repositioned Yes 06/12/20 0807  Prone position No 06/12/20 0807  Cuff Pressure (cm H2O) 26 cm H2O 06/12/20 0807  Site Condition Dry;Cool 06/12/20 0807     CVC Triple Lumen 06/10/20 Left Internal jugular (Active)  Indication for Insertion or Continuance of Line Chronic illness with exacerbations (CF, Sickle Cell, etc.) 06/11/20 2100  Site Assessment Clean;Dry;Intact 06/11/20 2100  Proximal Lumen Status Infusing;Flushed;Blood return noted 06/11/20 2100  Medial Lumen Status Infusing 06/11/20 2100  Distal Lumen Status Infusing;Flushed;Blood return noted 06/11/20 2100  Dressing Type Transparent;Occlusive 06/11/20 2100  Dressing Status Clean;Dry;Intact 06/11/20 2100  Antimicrobial disc in place? Yes 06/11/20 2100  Line Care Connections checked and tightened 06/11/20 2100  Dressing Intervention New dressing 06/10/20 2100  Dressing Change Due 06/17/20 06/11/20 2100     NG/OG Tube Orogastric Center mouth Xray (Active)  Cm Marking at Nare/Corner of Mouth (if applicable) 61 cm 37/04/88 2000  Site Assessment Clean;Dry;Intact 06/11/20 2000  Ongoing Placement Verification No change in cm markings or external length of tube from initial placement;No change in respiratory status;No acute changes, not attributed to clinical condition 06/11/20 2000  Status Infusing tube feed 06/11/20 2000  Drainage Appearance None 06/11/20 1600  Intake (mL) 30 mL 06/11/20 1600     Urethral Catheter Skeet Latch, RN Double-lumen;Latex 14 Fr. (Active)  Indication for Insertion or Continuance of Catheter Therapy based on hourly urine  output monitoring  and documentation for critical condition (NOT STRICT I&O) 06/12/20 0800  Site Assessment Clean;Intact 06/12/20 0800  Catheter Maintenance Bag below level of bladder;Catheter secured;Drainage bag/tubing not touching floor;Insertion date on drainage bag;No dependent loops;Seal intact 06/12/20 0800  Collection Container Standard drainage bag 06/12/20 0800  Securement Method Securing device (Describe) 06/12/20 0800  Urinary Catheter Interventions (if applicable) Unclamped 02/40/97 0800  Output (mL) 60 mL 06/11/20 1800    Microbiology/Sepsis markers: Results for orders placed or performed during the hospital encounter of 06/06/20  Blood culture (routine x 2)     Status: None   Collection Time: 06/06/20 12:07 PM   Specimen: BLOOD  Result Value Ref Range Status   Specimen Description BLOOD RIGHT [facility]  Final   Special Requests   Final    BOTTLES DRAWN AEROBIC AND ANAEROBIC Blood Culture adequate volume   Culture   Final    NO GROWTH 5 DAYS Performed at [facility], [address] [place], [place] [address]    Report Status 06/11/2020 FINAL  Final  Blood culture (routine x 2)     Status: None   Collection Time: 06/06/20 12:07 PM   Specimen: BLOOD  Result Value Ref Range Status   Specimen Description BLOOD LEFT AC  Final   Special Requests   Final    BOTTLES DRAWN AEROBIC AND ANAEROBIC Blood Culture adequate volume   Culture   Final    NO GROWTH 5 DAYS Performed at [facility], [address] [place], [place] [address]    Report Status 06/11/2020 FINAL  Final  Resp Panel by RT-PCR (Flu A&B, Covid) Nasopharyngeal Swab     Status: None   Collection Time: 06/06/20 12:07 PM   Specimen: Nasopharyngeal Swab; Nasopharyngeal(NP) swabs in vial transport medium  Result Value Ref Range Status   SARS Coronavirus 2 by RT PCR NEGATIVE NEGATIVE Final    Comment: (NOTE) SARS-CoV-2 target nucleic acids are NOT DETECTED.  The SARS-CoV-2 RNA is generally detectable in upper  respiratory specimens during the acute phase of infection. The lowest concentration of SARS-CoV-2 viral copies this assay can detect is 138 copies/mL. A negative result does not preclude SARS-Cov-2 infection and should not be used as the sole basis for treatment or other patient management decisions. A negative result may occur with  improper specimen collection/handling, submission of specimen other than nasopharyngeal swab, presence of viral mutation(s) within the areas targeted by this assay, and inadequate number of viral copies(<138 copies/mL). A negative result must be combined with clinical observations, patient history, and epidemiological information. The expected result is Negative.  Fact Sheet for Patients:  EntrepreneurPulse.com.au  Fact Sheet for Healthcare Providers:  IncredibleEmployment.be  This test is no t yet approved or cleared by the [place] FDA and  has been authorized for detection and/or diagnosis of SARS-CoV-2 by FDA under an Emergency Use Authorization (EUA). This EUA will remain  in effect (meaning this test can be used) for the duration of the COVID-19 declaration under Section 564(b)(1) of the Act, 21 U.S.C.section 360bbb-3(b)(1), unless the authorization is terminated  or revoked sooner.       Influenza A by PCR NEGATIVE NEGATIVE Final   Influenza B by PCR NEGATIVE NEGATIVE Final    Comment: (NOTE) The Xpert Xpress SARS-CoV-2/FLU/RSV plus assay is intended as an aid in the diagnosis of influenza from Nasopharyngeal swab specimens and should not be used as a sole basis for treatment. Nasal washings and aspirates are unacceptable for Xpert Xpress SARS-CoV-2/FLU/RSV  testing.  Fact Sheet for Patients: EntrepreneurPulse.com.au  Fact Sheet for Healthcare Providers: IncredibleEmployment.be  This test is not yet approved or cleared by the [place] FDA and has been  authorized for detection and/or diagnosis of SARS-CoV-2 by FDA under an Emergency Use Authorization (EUA). This EUA will remain in effect (meaning this test can be used) for the duration of the COVID-19 declaration under Section 564(b)(1) of the Act, 21 U.S.C. section 360bbb-3(b)(1), unless the authorization is terminated or revoked.  Performed at [facility], [address] [place], [place] [address]   MRSA PCR Screening     Status: None   Collection Time: 06/06/20 11:00 PM   Specimen: Nasopharyngeal  Result Value Ref Range Status   MRSA by PCR NEGATIVE NEGATIVE Final    Comment:        The GeneXpert MRSA Assay (FDA approved for NASAL specimens only), is one component of a comprehensive MRSA colonization surveillance program. It is not intended to diagnose MRSA infection nor to guide or monitor treatment for MRSA infections. Performed at [facility], [address], [place], [place] [id number]     Anti-infectives:  Anti-infectives (From admission, onward)   Start     Dose/Rate Route Frequency Ordered Stop   06/07/20 1400  azithromycin (ZITHROMAX) 500 mg in sodium chloride 0.9 % 250 mL IVPB  Status:  Discontinued        500 mg 250 mL/hr over 60 Minutes Intravenous Every 24 hours 06/06/20 2030 06/06/20 2044   06/07/20 1400  azithromycin (ZITHROMAX) 500 mg in sodium chloride 0.9 % 250 mL IVPB  Status:  Discontinued        500 mg 250 mL/hr over 60 Minutes Intravenous Every 24 hours 06/06/20 2044 06/08/20 1031   06/06/20 1415  cefTRIAXone (ROCEPHIN) 2 g in sodium chloride 0.9 % 100 mL IVPB        2 g 200 mL/hr over 30 Minutes Intravenous  Once 06/06/20 1407 06/06/20 1514   06/06/20 1415  azithromycin (ZITHROMAX) 500 mg in sodium chloride 0.9 % 250 mL IVPB        500 mg 250 mL/hr over 60 Minutes Intravenous  Once 06/06/20 1407 06/06/20 1708       Consults:   ENT - tracheostomy  PAST MEDICAL HISTORY   Past Medical History:  Diagnosis Date  .  Anemia   . Anxiety   . Arthritis   . Atrial fibrillation ([place])   . C. difficile colitis 09/2015  . COPD (chronic obstructive pulmonary disease) ([place])   . Depression   . Dyspnea   . Dysrhythmia   . Fibromyalgia   . H/O tracheostomy   . Hep C w/ coma, chronic   . Hypertension   . MRSA pneumonia ([place]) 2017  . On home oxygen therapy    3 L/M   . Osteoporosis   . Peripheral neuropathy   . RLS (restless legs syndrome)   . S/P percutaneous endoscopic gastrostomy (PEG) tube placement ([place]) 09/2015  . Ventilator associated pneumonia [facility]) 10/2015   [facility], [place]     SURGICAL HISTORY   Past Surgical History:  Procedure Laterality Date  . ABDOMINAL HYSTERECTOMY    . BREAST SURGERY Bilateral    Breast Implants  . DILATION AND CURETTAGE OF UTERUS    . REVERSE SHOULDER ARTHROPLASTY Right 03/06/2017   Procedure: REVERSE SHOULDER ARTHROPLASTY;  Surgeon: Corky Mull, MD;  Location: [facility] ORS;  Service: Orthopedics;  Laterality: Right;  . ROTATOR CUFF REPAIR Bilateral   .  TRACHEOSTOMY TUBE PLACEMENT N/A 06/15/2020   Procedure: TRACHEOSTOMY;  Surgeon: Clyde Canterbury, MD;  Location: [facility] ORS;  Service: ENT;  Laterality: N/A;     FAMILY HISTORY   Family History  Problem Relation Age of Onset  . Hypertension Mother      SOCIAL HISTORY   Social History   Tobacco Use  . Smoking status: Former Smoker    Packs/day: 1.50    Types: Cigarettes    Quit date: 10/05/2015    Years since quitting: 4.7  . Smokeless tobacco: Never Used  Vaping Use  . Vaping Use: Some days  . Last attempt to quit: 10/05/2015  Substance Use Topics  . Alcohol use: No  . Drug use: No     MEDICATIONS   Current Medication:  Current Facility-Administered Medications:  .  0.9 %  sodium chloride infusion (Manually program via Guardrails IV Fluids), , Intravenous, Once, Rust-Chester, Huel Cote, NP, Held at 06/18/20 [phone] .  0.9 %  sodium chloride infusion, 250 mL, Intravenous, Continuous,  Duffy Bruce, MD, Stopped at 06/14/20 0800 .  0.9 %  sodium chloride infusion, 250 mL, Intravenous, PRN, Mortimer Fries, Kurian, MD .  0.9 %  sodium chloride infusion, 250 mL, Intravenous, Continuous, Kasa, Kurian, MD, Stopped at 06/12/20 1131 .  acetaminophen (TYLENOL) 160 MG/5ML solution 650 mg, 650 mg, Per Tube, Q4H PRN, Flora Lipps, MD .  chlorhexidine gluconate (MEDLINE KIT) (PERIDEX) 0.12 % solution 15 mL, 15 mL, Mouth Rinse, BID, Kasa, Kurian, MD, 15 mL at 06/19/20 1947 .  Chlorhexidine Gluconate Cloth 2 % PADS 6 each, 6 each, Topical, Daily, Flora Lipps, MD, 6 each at 06/20/20 1454 .  dexmedetomidine (PRECEDEX) 400 MCG/100ML (4 mcg/mL) infusion, 0.4-1.2 mcg/kg/hr, Intravenous, Titrated, Aleskerov, Fuad, MD, Last Rate: 17.94 mL/hr at 06/20/20 1143, 1.2 mcg/kg/hr at 06/20/20 1143 .  dextrose 5 % in lactated ringers infusion, , Intravenous, Continuous, Tyna Jaksch, MD, Last Rate: 75 mL/hr at 06/20/20 1113, New Bag at 06/20/20 1113 .  diazepam (VALIUM) tablet 5 mg, 5 mg, Per Tube, Q8H PRN, Rust-Chester, Britton L, NP, 5 mg at 06/15/20 2143 .  fentaNYL (SUBLIMAZE) bolus via infusion 25 mcg, 25 mcg, Intravenous, Q15 min PRN, Darel Hong D, NP, 25 mcg at 06/17/20 0655 .  fentaNYL 2524mg in NS 2524m(106mml) infusion-PREMIX, 0-400 mcg/hr, Intravenous, Continuous, Kasa, Kurian, MD, Last Rate: 40 mL/hr at 06/20/20 1112, 400 mcg/hr at 06/20/20 1112 .  insulin aspart (novoLOG) injection 0-6 Units, 0-6 Units, Subcutaneous, Q4H, GruDallie PilesPH, 1 Units at 06/20/20 1141 .  ipratropium-albuterol (DUONEB) 0.5-2.5 (3) MG/3ML nebulizer solution 3 mL, 3 mL, Nebulization, Q6H, Blakeney, Dana G, NP, 3 mL at 06/20/20 1441 .  ipratropium-albuterol (DUONEB) 0.5-2.5 (3) MG/3ML nebulizer solution 3 mL, 3 mL, Nebulization, Q6H PRN, BlaAwilda BillP .  labetalol (NORMODYNE) injection 10 mg, 10 mg, Intravenous, Q4H PRN, IzqTyna JakschD .  magnesium sulfate IVPB 2 g 50 mL, 2 g, Intravenous, Once,  ChaBenita GutterPH .  MEDLINE mouth rinse, 15 mL, Mouth Rinse, 10 times per day, KasFlora LippsD, 15 mL at 06/20/20 1455 .  methylPREDNISolone sodium succinate (SOLU-MEDROL) 40 mg/mL injection 20 mg, 20 mg, Intravenous, Q24H, 20 mg at 06/20/20 0916 **FOLLOWED BY** [START ON 06/27/2020] methylPREDNISolone sodium succinate (SOLU-MEDROL) 40 mg/mL injection 10 mg, 10 mg, Intravenous, Q24H, IzqTyna JakschD .  metoprolol tartrate (LOPRESSOR) injection 5 mg, 5 mg, Intravenous, Q3H PRN, AleOttie GlazierD, 5 mg at 06/17/20 2006 .  metoprolol tartrate (LOPRESSOR)  injection 5 mg, 5 mg, Intravenous, Q6H, Tyna Jaksch, MD, 5 mg at 06/20/20 1142 .  midazolam (VERSED) injection 1 mg, 1 mg, Intravenous, Q1H PRN, Rust-Chester, Britton L, NP, 1 mg at 06/20/20 1440 .  ondansetron (ZOFRAN) injection 4 mg, 4 mg, Intravenous, Q6H PRN, Mortimer Fries, Kurian, MD .  pantoprazole (PROTONIX) injection 40 mg, 40 mg, Intravenous, QHS, Dallie Piles, RPH, 40 mg at 06/19/20 2147 .  polyethylene glycol (MIRALAX / GLYCOLAX) packet 17 g, 17 g, Per Tube, Daily PRN, Mortimer Fries, Kurian, MD .  potassium chloride 10 mEq in 100 mL IVPB, 10 mEq, Intravenous, Q1 Hr x 4, Tyna Jaksch, MD, Last Rate: 100 mL/hr at 06/20/20 1409, 10 mEq at 06/20/20 1409 .  sodium chloride flush (NS) 0.9 % injection 10-40 mL, 10-40 mL, Intracatheter, Q12H, Flora Lipps, MD, 10 mL at 06/20/20 0916 .  sodium chloride flush (NS) 0.9 % injection 10-40 mL, 10-40 mL, Intracatheter, PRN, Mortimer Fries, Kurian, MD .  sodium chloride flush (NS) 0.9 % injection 3 mL, 3 mL, Intravenous, Q12H, Kasa, Kurian, MD, 3 mL at 06/19/20 2148 .  sodium chloride flush (NS) 0.9 % injection 3 mL, 3 mL, Intravenous, PRN, Flora Lipps, MD    ALLERGIES   Patient has no known allergies.    REVIEW OF SYSTEMS    Unable to obtain on sedation and mechanical ventilation  PHYSICAL EXAMINATION   Vital Signs: Temp:  [99.1 F (37.3 C)-100.1 F (37.8 C)] 99.1 F (37.3 C) (12/28  0700) Pulse Rate:  [60-95] 81 (12/28 1445) Resp:  [11-25] 25 (12/28 1445) BP: (110-176)/(72-133) 156/91 (12/28 1445) SpO2:  [88 %-100 %] 94 % (12/28 1447) FiO2 (%):  [28 %] 28 % (12/28 1438) Weight:  [58.6 kg] 58.6 kg (12/28 0500)  GENERAL:age appropriate sedated on MV HEAD: Normocephalic, atraumatic.  EYES: Pupils equal, round, reactive to light.  No scleral icterus.  MOUTH: Moist mucosal membrane. NECK: Supple. No thyromegaly. No nodules. No JVD.  PULMONARY: Clear breath sounds no evidence of increased work of breathing CARDIOVASCULAR: S1 and S2. Regular rate and rhythm. No murmurs, rubs, or gallops.  GASTROINTESTINAL: Soft, nontender, non-distended. No masses. Positive bowel sounds. No hepatosplenomegaly.  MUSCULOSKELETAL: No swelling, clubbing, or edema.  NEUROLOGIC: GCS3T SKIN:intact,warm,dry   PERTINENT DATA     Infusions: . sodium chloride Stopped (06/14/20 0800)  . sodium chloride    . sodium chloride Stopped (06/12/20 1131)  . dexmedetomidine (PRECEDEX) IV infusion 1.2 mcg/kg/hr (06/20/20 1143)  . dextrose 5% lactated ringers 75 mL/hr at 06/20/20 1113  . fentaNYL infusion INTRAVENOUS 400 mcg/hr (06/20/20 1112)  . magnesium sulfate bolus IVPB    . potassium chloride 10 mEq (06/20/20 1409)   Scheduled Medications: . sodium chloride   Intravenous Once  . chlorhexidine gluconate (MEDLINE KIT)  15 mL Mouth Rinse BID  . Chlorhexidine Gluconate Cloth  6 each Topical Daily  . insulin aspart  0-6 Units Subcutaneous Q4H  . ipratropium-albuterol  3 mL Nebulization Q6H  . mouth rinse  15 mL Mouth Rinse 10 times per day  . methylPREDNISolone (SOLU-MEDROL) injection  20 mg Intravenous Q24H   Followed by  . [START ON 06/27/2020] methylPREDNISolone (SOLU-MEDROL) injection  10 mg Intravenous Q24H  . metoprolol tartrate  5 mg Intravenous Q6H  . pantoprazole (PROTONIX) IV  40 mg Intravenous QHS  . sodium chloride flush  10-40 mL Intracatheter Q12H  . sodium chloride flush  3 mL  Intravenous Q12H   PRN Medications: sodium chloride, acetaminophen (TYLENOL) oral liquid 160 mg/5 mL,  diazepam, fentaNYL, ipratropium-albuterol, labetalol, metoprolol tartrate, midazolam, ondansetron (ZOFRAN) IV, polyethylene glycol, sodium chloride flush, sodium chloride flush Hemodynamic parameters:   Intake/Output: 12/27 0701 - 12/28 0700 In: 2903.9 [I.V.:2903.9] Out: 1425 [Urine:1375; Stool:50]  Ventilator  Settings: Vent Mode: PRVC FiO2 (%):  [28 %] 28 % Set Rate:  [14 bmp] 14 bmp Vt Set:  [420 mL] 420 mL PEEP:  [5 cmH20] 5 cmH20   LAB RESULTS:  Basic Metabolic Panel: Recent Labs  Lab 06/16/20 0440 06/17/20 0500 06/18/20 0414 06/19/20 0500 06/20/20 0551  NA 146* 145 148* 142 142  K 4.1 4.3 3.9 3.4* 3.4*  CL 100 99 100 97* 99  CO2 40* 41* 39* 33* 36*  GLUCOSE 141* 158* 87 75 128*  BUN 35* 31* 24* 19 13  CREATININE 0.57 0.44 0.55 0.68 0.55  CALCIUM 8.7* 8.7* 8.7* 8.5* 8.3*  MG 2.0 2.3 2.2 2.0 1.8  PHOS 2.9 3.0 3.1 2.8 3.5   Liver Function Tests: No results for input(s): AST, ALT, ALKPHOS, BILITOT, PROT, ALBUMIN in the last 168 hours. No results for input(s): LIPASE, AMYLASE in the last 168 hours. No results for input(s): AMMONIA in the last 168 hours. CBC: Recent Labs  Lab 06/16/20 0440 06/17/20 0500 06/18/20 0414 06/18/20 1700 06/19/20 0500 06/20/20 0551  WBC 19.3* 14.1* 10.6*  --  9.2 8.3  HGB 7.9* 7.6* 6.9* 8.6* 8.0* 7.7*  HCT 25.4* 25.1* 23.3* 27.4* 26.0* 25.0*  MCV 102.0* 102.9* 103.6*  --  96.3 96.5  PLT 256 245 203  --  171 151   Cardiac Enzymes: No results for input(s): CKTOTAL, CKMB, CKMBINDEX, TROPONINI in the last 168 hours. BNP: Invalid input(s): POCBNP CBG: Recent Labs  Lab 06/19/20 1948 06/19/20 2334 06/20/20 0334 06/20/20 0749 06/20/20 1137  GLUCAP 126* 110* 112* 107* 152*       IMAGING RESULTS:  Imaging: DG Chest Port 1 View  Result Date: 06/19/2020 CLINICAL DATA:  Acute respiratory failure. Tracheostomy tube. EXAM:  PORTABLE CHEST 1 VIEW COMPARISON:  Chest x-ray 06/14/2020 FINDINGS: The tracheostomy tube is in good position. Left IJ catheter is stable. The NG tube is been removed. Stable emphysematous changes and hyperinflation. No infiltrates or pneumothorax. IMPRESSION: 1. Tracheostomy tube in good position. 2. Stable emphysematous changes and hyperinflation. No infiltrates or effusions. Electronically Signed   By: Marijo Sanes M.D.   On: 06/19/2020 05:50   '@PROBHOSP' @ No results found.   ASSESSMENT AND PLAN    -Multidisciplinary rounds held today  Acute Hypoxic Respiratory Failure -FiO2 is at 30% today -AECOPD  -continue Full MV support -continue Bronchodilator Therapy -Wean Fio2 and PEEP as tolerated -will perform SAT/SBT when respiratory parameters are met -s/p KUB - OGT advanced as indicated by radiologist -s/p Tracheostomy -Do trach collar for at least 1 hour increments as patient tolerates and will extend time. On these trials over the next 24 to 48 hours   Acute on chronic diastolic CHF -Patient appears euvolemic at this time. No specific interventions indicated   GI/Nutrition GI PROPHYLAXIS as indicated.  DIET-->TF's as tolerated Constipation protocol as indicated -PEG tube placement tentatively for 06-21-2020 -Have ordered that prophylactic Lovenox be held for procedure  ENDO - ICU hypoglycemic\Hyperglycemia protocol -check FSBS per protocol   ELECTROLYTES -follow labs as needed -replace as needed -pharmacy consultation   DVT/GI PRX ordered -SCDs  TRANSFUSIONS AS NEEDED MONITOR FSBS ASSESS the need for LABS as needed   Critical care provider statement:    Critical care time (minutes):  35   Critical care  time was exclusive of:  Separately billable procedures and treating other patients   Critical care was necessary to treat or prevent imminent or life-threatening deterioration of the following conditions:  acute hypoxemic respiratory failure, AECOPD, multiple  comorbid conditions   Critical care was time spent personally by me on the following activities:  Development of treatment plan with patient or surrogate, discussions with consultants, evaluation of patient's response to treatment, examination of patient, obtaining history from patient or surrogate, ordering and performing treatments and interventions, ordering and review of laboratory studies and re-evaluation of patient's condition.  I assumed direction of critical care for this patient from another provider in my specialty: no    This document was prepared using Dragon voice recognition software and may include unintentional dictation errors.    Newell Coral DO Internal Medicine/Pediatrics Pulmonary and Critical Care Fellow PGY-7

## 2020-06-21 ENCOUNTER — Inpatient Hospital Stay: Payer: Medicare Other | Admitting: Anesthesiology

## 2020-06-21 ENCOUNTER — Encounter: Payer: Self-pay | Admitting: Internal Medicine

## 2020-06-21 ENCOUNTER — Encounter
Admission: EM | Disposition: A | Discharge status: Home Health | Payer: Self-pay | Source: Home / Self Care | Attending: Internal Medicine

## 2020-06-21 DIAGNOSIS — J9602 Acute respiratory failure with hypercapnia: Secondary | ICD-10-CM | POA: Diagnosis not present

## 2020-06-21 DIAGNOSIS — Z515 Encounter for palliative care: Secondary | ICD-10-CM | POA: Diagnosis not present

## 2020-06-21 DIAGNOSIS — Z7189 Other specified counseling: Secondary | ICD-10-CM | POA: Diagnosis not present

## 2020-06-21 DIAGNOSIS — J9601 Acute respiratory failure with hypoxia: Secondary | ICD-10-CM | POA: Diagnosis not present

## 2020-06-21 HISTORY — PX: PEG PLACEMENT: SHX5437

## 2020-06-21 LAB — GLUCOSE, CAPILLARY
Glucose-Capillary: 100 mg/dL — ABNORMAL HIGH (ref 70–99)
Glucose-Capillary: 101 mg/dL — ABNORMAL HIGH (ref 70–99)
Glucose-Capillary: 114 mg/dL — ABNORMAL HIGH (ref 70–99)
Glucose-Capillary: 117 mg/dL — ABNORMAL HIGH (ref 70–99)
Glucose-Capillary: 125 mg/dL — ABNORMAL HIGH (ref 70–99)
Glucose-Capillary: 126 mg/dL — ABNORMAL HIGH (ref 70–99)
Glucose-Capillary: 96 mg/dL (ref 70–99)
Glucose-Capillary: 97 mg/dL (ref 70–99)

## 2020-06-21 LAB — BASIC METABOLIC PANEL
Anion gap: 5 (ref 5–15)
BUN: 8 mg/dL (ref 8–23)
CO2: 34 mmol/L — ABNORMAL HIGH (ref 22–32)
Calcium: 8.3 mg/dL — ABNORMAL LOW (ref 8.9–10.3)
Chloride: 96 mmol/L — ABNORMAL LOW (ref 98–111)
Creatinine, Ser: 0.48 mg/dL (ref 0.44–1.00)
GFR, Estimated: >60 mL/min (ref >60)
Glucose, Bld: 129 mg/dL — ABNORMAL HIGH (ref 70–99)
Potassium: 3.7 mmol/L (ref 3.5–5.1)
Sodium: 135 mmol/L (ref 135–145)

## 2020-06-21 LAB — PHOSPHORUS: Phosphorus: 3.3 mg/dL (ref 2.5–4.6)

## 2020-06-21 LAB — MAGNESIUM: Magnesium: 2.1 mg/dL (ref 1.7–2.4)

## 2020-06-21 SURGERY — INSERTION, PEG TUBE
Anesthesia: General

## 2020-06-21 MED ORDER — LACTATED RINGERS IV SOLN: INTRAVENOUS | Status: DC | PRN

## 2020-06-21 MED ORDER — PROPOFOL 10 MG/ML IV BOLUS
INTRAVENOUS | Status: DC | PRN
  Administered 2020-06-21: 80 mg via INTRAVENOUS

## 2020-06-21 MED ORDER — CEFAZOLIN SODIUM-DEXTROSE 2-4 GM/100ML-% IV SOLN
2.0000 g | Freq: Once | INTRAVENOUS | Status: AC
  Administered 2020-06-21: 2 g via INTRAVENOUS
  Filled 2020-06-21: qty 100

## 2020-06-21 NOTE — Transfer of Care (Signed)
Immediate Anesthesia Transfer of Care Note  Patient: Virginia [name]  Procedure(s) Performed: PERCUTANEOUS ENDOSCOPIC GASTROSTOMY (PEG) PLACEMENT (N/A )  Patient Location: ICU  Anesthesia Type:General  Level of Consciousness: drowsy  Airway & Oxygen Therapy: Patient remains intubated per anesthesia plan and Patient placed on Ventilator (see vital sign flow sheet for setting)  Post-op Assessment: Report given to RN and Post -op Vital signs reviewed and stable  Post vital signs: Reviewed and stable  Last Vitals: see Epic record Vitals Value Taken Time  BP    Temp    Pulse    Resp    SpO2      Last Pain:  Vitals:   06/21/20 1200  TempSrc: Oral  PainSc:       Patients Stated Pain Goal: 0 (06/18/20 0000)  Complications: No complications documented.

## 2020-06-21 NOTE — Progress Notes (Signed)
SLP Cancellation Note  Patient Details Name: Virginia [name] MRN: [id number] DOB: 20-Jul-1950   Cancelled treatment:       Reason Eval/Treat Not Completed: Medical issues which prohibited therapy;Patient not medically ready (chart reviewed; had consulted MD yesterday pm). Per NSG notes, "Patient alert but times of intermittent agitation and confusion. Patient placed on trach collar for about 2 hours today. Patient became short of breath, labored breathing and accessory muscle use. Placed back on vent support.".  This was a similar experience w/ trach collar trial the day b/f.   Pt does not currently appear ready for PMV evaluation and use d/t her Pulmonary status. Recommend pt f/u w/ skilled ST services for PMV evaluation, even BSE when appropriate, at her next level of care as pt's needs may require lengthy attention and time for weaning. Per pt's family, pt had a h/o Tracheostomy and lengthy wean. She also had a h/o PEG placement during that time to meet her nutrition/hydration needs and was able to wean from that after some time per report. Per discussion w/ MD yesterday PM, pt and family have decided on a PEG placement for nutrition/hydration needs as she prepares for d/c to Rehab.  Recommend frequent oral care for hygiene and stimulation of swallowing during NPO status, PEG. Recommend f/u of skilled ST services for evaluation of PMV and BSE when appropriate as part of her Rehabilitation POC at next venue of care. MD agreed.     Jerilynn Som, MS, CCC-SLP Speech Language Pathologist Rehab Services [phone] [facility] 06/21/2020, 8:25 AM

## 2020-06-21 NOTE — Consult Note (Signed)
PHARMACY CONSULT NOTE  Pharmacy Consult for Electrolyte Monitoring and Replacement   Recent Labs: Potassium (mmol/L)  Date Value  06/21/2020 3.7   Magnesium (mg/dL)  Date Value  25/95/6387 2.1   Calcium (mg/dL)  Date Value  56/43/3295 8.3 (L)   Albumin (g/dL)  Date Value  18/84/1660 3.0 (L)   Phosphorus (mg/dL)  Date Value  63/06/6008 3.3   Sodium (mmol/L)  Date Value  06/21/2020 135   Assessment: 70 yo female admitted with acute on chronic hypoxic hypercapnic respiratory failure and acute encephalopathy secondary to narcotic use, AECOPD requiring mechanical intubation. Patient is pending PEG tube placement which is tentatively planned for 12/29.  MIVF: D5LR at 75 mL/hr  Goal of Therapy:  Potassium 4.0 - 5.1 mmol/L Magnesium 2.0 - 2.4 mg/dL All Other Electrolytes within normal limits  Plan:   K 3.7. Improved with IV KCl 10 mEq x 4 yesterday  Mg 2.1. Improved with IV magnesium sulfate 2 g x 1 yesterday  No electrolyte replacement warranted today  Next labs 12/30 AM  Tressie Ellis 06/21/2020 9:25 AM

## 2020-06-21 NOTE — Progress Notes (Signed)
Took over patient's care from [facility] at 1000. Patient is alert and calm on the ventilator. Patient placed on trach collar today and lasted about 3 hrs. RT needed to put the patient back on the vent for PEG tube placement.  1645: Patient returned from Endo. Still asleep, just got Propofol on the way back per CRNA. Hooked back on the monitor and vent. VS stable. satting 100%.   Daughter in law called and RN gave updates.

## 2020-06-21 NOTE — Anesthesia Preprocedure Evaluation (Signed)
Anesthesia Evaluation  Patient identified by MRN, date of birth, ID band Patient awake    Reviewed: Allergy & Precautions, H&P , NPO status , Patient's Chart, lab work & pertinent test results  History of Anesthesia Complications Negative for: history of anesthetic complications  Airway Mallampati: III  TM Distance: <3 FB Neck ROM: limited    Dental  (+) Poor Dentition, Chipped, Missing, Upper Dentures, Partial Lower   Pulmonary shortness of breath, with exertion and at rest, pneumonia, COPD,  COPD inhaler and oxygen dependent, former smoker,           Cardiovascular Exercise Tolerance: Poor hypertension, (-) angina+ CAD and + PND  (-) Past MI + dysrhythmias Atrial Fibrillation   06/07/2020 ECHO IMPRESSIONS    1. Left ventricular ejection fraction, by estimation, is 65 to 70%. The  left ventricle has normal function. The left ventricle has no regional  wall motion abnormalities. Left ventricular diastolic function could not  be evaluated.  2. Right ventricular systolic function is normal. The right ventricular  size is normal.  3. The mitral valve is normal in structure. No evidence of mitral valve  regurgitation.  4. The aortic valve is normal in structure. Aortic valve regurgitation is  not visualized.    Neuro/Psych PSYCHIATRIC DISORDERS Anxiety Depression  Neuromuscular disease    GI/Hepatic GERD  ,(+) Hepatitis -, C  Endo/Other  negative endocrine ROS  Renal/GU      Musculoskeletal  (+) Arthritis , Fibromyalgia -  Abdominal   Peds  Hematology negative hematology ROS (+) anemia ,   Anesthesia Other Findings Patient has cardiac and pulmonary clearance for this procedure.    Past Medical History: No date: Anemia No date: Anxiety No date: Arthritis No date: Atrial fibrillation (HCC) 09/2015: C. difficile colitis No date: COPD (chronic obstructive pulmonary disease) (HCC) No date: Depression No  date: Dyspnea No date: Dysrhythmia No date: Fibromyalgia No date: H/O tracheostomy No date: Hep C w/ coma, chronic (HCC) No date: Hypertension 2017: MRSA pneumonia (HCC) No date: On home oxygen therapy     Comment:  3 L/M  No date: Osteoporosis No date: Peripheral neuropathy No date: RLS (restless legs syndrome) 09/2015: S/P percutaneous endoscopic gastrostomy (PEG) tube placement  (HCC) 10/2015: Ventilator associated pneumonia (HCC)     Comment:  [facility], [place]  Past Surgical History: No date: ABDOMINAL HYSTERECTOMY No date: BREAST SURGERY; Bilateral     Comment:  Breast Implants No date: DILATION AND CURETTAGE OF UTERUS No date: ROTATOR CUFF REPAIR; Bilateral  BMI    Body Mass Index:  21.68 kg/m      Reproductive/Obstetrics negative OB ROS                             Anesthesia Physical  Anesthesia Plan  ASA: IV  Anesthesia Plan: General ETT   Post-op Pain Management:    Induction: Intravenous  PONV Risk Score and Plan: 3 and Ondansetron, Dexamethasone, Midazolam and Treatment may vary due to age or medical condition  Airway Management Planned: Tracheostomy  Additional Equipment:   Intra-op Plan:   Post-operative Plan: Post-operative intubation/ventilation  Informed Consent: I have reviewed the patients History and Physical, chart, labs and discussed the procedure including the risks, benefits and alternatives for the proposed anesthesia with the patient or authorized representative who has indicated his/her understanding and acceptance.     Dental Advisory Given and Dental advisory given  Plan Discussed with: Anesthesiologist, CRNA and Surgeon  Anesthesia Plan Comments: (Patient and family informed that patient is higher risk for complications from anesthesia during this procedure due to their medical history and age including but not limited to post operative cognitive dysfunction.  They voiced  understanding.  Patient consented for risks of anesthesia including but not limited to:  - adverse reactions to medications - damage to teeth, lips or other oral mucosa - sore throat or hoarseness - Damage to heart, brain, lungs or loss of life  Patient voiced understanding.)        Anesthesia Quick Evaluation

## 2020-06-21 NOTE — Op Note (Signed)
[facility] Gastroenterology Patient Name: Virginia [name] Procedure Date: 06/21/2020 4:20 PM MRN: [id number] Account #: [id number] Date of Birth: 02/28/1951 Admit Type: Inpatient Age: 69 Room: [facility] ENDO ROOM 2 Gender: Female Note Status: Finalized Procedure:             Upper GI endoscopy Indications:           Place PEG because patient requires ventilator support Providers:             Andrey Farmer MD, MD, Benay Pike. Alice Reichert MD, MD Referring MD:          Leonie Douglas. Doy Hutching, MD (Referring MD) Medicines:             Monitored Anesthesia Care Complications:         No immediate complications. Estimated blood loss:                         Minimal. Procedure:             Pre-Anesthesia Assessment:                        - Prior to the procedure, a History and Physical was                         performed, and patient medications and allergies were                         reviewed. The patient is competent. The risks and                         benefits of the procedure and the sedation options and                         risks were discussed with the patient. All questions                         were answered and informed consent was obtained.                         Patient identification and proposed procedure were                         verified by the physician, the nurse, the anesthetist                         and the technician in the endoscopy suite. Mental                         Status Examination: alert and oriented. Airway                         Examination: tracheostomy via ventilator. Respiratory                         Examination: expiratory wheezes. CV Examination:                         normal. Prophylactic Antibiotics: The patient requires  prophylactic antibiotics for planned PEG placement.                         The patient received antibiotic therapy today, before                         the procedure started.  Prior Anticoagulants: The                         patient has taken no previous anticoagulant or                         antiplatelet agents. ASA Grade Assessment: IV - A                         patient with severe systemic disease that is a                         constant threat to life. After reviewing the risks and                         benefits, the patient was deemed in satisfactory                         condition to undergo the procedure. The anesthesia                         plan was to use monitored anesthesia care (MAC).                         Immediately prior to administration of medications,                         the patient was re-assessed for adequacy to receive                         sedatives. The heart rate, respiratory rate, oxygen                         saturations, blood pressure, adequacy of pulmonary                         ventilation, and response to care were monitored                         throughout the procedure. The physical status of the                         patient was re-assessed after the procedure.                        After obtaining informed consent, the endoscope was                         passed under direct vision. Throughout the procedure,                         the patient's blood pressure, pulse, and oxygen  saturations were monitored continuously. The Endoscope                         was introduced through the mouth, and advanced to the                         second part of duodenum. The upper GI endoscopy was                         accomplished without difficulty. The patient tolerated                         the procedure well. Findings:      The examined esophagus was normal.      The entire examined stomach was normal.      The examined duodenum was normal.      The patient was placed in the supine position for PEG placement. The       stomach was insufflated to appose gastric and abdominal walls.  A site       was located in the body of the stomach with excellent transillumination       and manual external pressure for placement. The abdominal wall was       marked and prepped in a sterile manner. The area was anesthetized with       0.5% lidocaine. The trocar needle was introduced through the abdominal       wall and into the stomach under direct endoscopic view. A snare was       introduced through the endoscope and opened in the gastric lumen. The       guide wire was passed through the trocar and into the open snare. The       snare was closed around the guide wire. The endoscope and snare were       removed, pulling the wire out through the mouth. A skin incision was       made at the site of needle insertion. The externally removable 20 Fr       EndoVive Safety gastrostomy tube was lubricated. The G-tube was tied to       the guide wire and pulled through the mouth and into the stomach. The       trocar needle was removed, and the gastrostomy tube was pulled out from       the stomach through the skin. The external bumper was attached to the       gastrostomy tube, and the tube was cut to remove the guide wire. The       final position of the gastrostomy tube was confirmed by relook       endoscopy, and skin marking noted to be 2.5 cm at the external bumper.       The final tension and compression of the abdominal wall by the PEG tube       and external bumper were checked and revealed that the bumper was loose       and lightly touching the skin. The feeding tube was capped, and the tube       site cleaned and dressed. Impression:            - Normal esophagus.                        -  Normal stomach.                        - Normal examined duodenum.                        - An externally removable PEG placement was                         successfully completed.                        - No specimens collected. Recommendation:        - Please follow the post-PEG  recommendations                         including: may use PEG today for meds and water, may                         use PEG tomorrow for feedings and clean site with soap                         and water daily and dry thoroughly. 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 All rights reserved. The codes documented in this report are preliminary and upon coder review may  be revised to meet current compliance requirements. Andrey Farmer, MD Andrey Farmer MD, MD 06/21/2020 4:46:56 PM Efrain Sella MD, MD Number of Addenda: 0 Note Initiated On: 06/21/2020 4:20 PM Estimated Blood Loss:  Estimated blood loss was minimal.      [facility]

## 2020-06-21 NOTE — Care Plan (Addendum)
PEG successfully placed. May use immediately for water flushes and meds. Can start tube feeding tomorrow morning. Would hold off on restarting anticoagulation for 24 hours. Recommend abdominal binder. PEG will need to be in place at least six weeks before considering removing.  Please call with any questions or concerns.  Merlyn Lot MD, MPH [facility] GI

## 2020-06-21 NOTE — Anesthesia Postprocedure Evaluation (Signed)
Anesthesia Post Note  Patient: Virginia [name]  Procedure(s) Performed: PERCUTANEOUS ENDOSCOPIC GASTROSTOMY (PEG) PLACEMENT (N/A )  Patient location during evaluation: Endoscopy Anesthesia Type: General Level of consciousness: awake and alert Pain management: pain level controlled Vital Signs Assessment: post-procedure vital signs reviewed and stable Respiratory status: spontaneous breathing and respiratory function stable Cardiovascular status: stable Anesthetic complications: no   No complications documented.   Last Vitals:  Vitals:   06/21/20 1800 06/21/20 1900  BP: (!) 153/90 (!) 170/99  Pulse:    Resp: 19 14  Temp:    SpO2:      Last Pain:  Vitals:   06/21/20 1200  TempSrc: Oral  PainSc:                  Maxx Calaway K

## 2020-06-21 NOTE — Progress Notes (Signed)
CRITICAL CARE PROGRESS NOTE    Name: Virginia [name] MRN: [id number] DOB: May 16, 1951     LOS: 39   SUBJECTIVE FINDINGS & SIGNIFICANT EVENTS    Patient description:  69 yo female with severe COPD admitted with acute on chronic hypoxic hypercapnic respiratory failure and acute encephalopathy secondary to narcotic use and AECOPD requiring mechanical intubation  12/14 admitted to ICU for COPD exacerbation 12/15 plan for SAT/SBT 12/16 failed SAT/SBT, will try again today 12/16 s/p extubation to BiPAP 12/17 resp status stilltenuous 12/18 severe resp failure high risk for intubation 12/18 Re intubated for severe COPD 12/19 severe resp  Failure, ENT consulted for trach  06/12/20- sedated to RASS-2.  Plan for trache as per previous discussion with critical care team and family. DCd amio gtt today . Weaning solumedrol to 40 daily from BID. Patient on Levophed and metoprolol we will remove arrythmogenic vasopressor today.   12/21- Trache postponed to 12/23 due to anticoagulation. Patient weaned down to 21% will attempt SBT again today- patient failed with severe hypercapnia, plan continues to be for trache. Family updated today. Poke with Judeen Hammans DIL.  12/22- no events overnight. Plan for trache in am.   12/23- pt s/p trache and OGT for nourishment. Patient is able to respond appropriately to verbal communication.  12/24- patient had episodes of SVT today.  She is on scheduled bb and is in NSR now.   06/17/20- patient unchanged without overnight events. Weaning FIo2 on ventilator today.  06/18/20- patient is awake and communcative on fentanyl and precedex RASS-0.  I spoke to son POA and after speaking with him and they wish to have PEG placed. Will call GI for consult.    12/27-confirmed family would like PEG.  Patient lasted 2 hours on trach collar trial  06-20-20-patient able to tolerate trach collar trial for at least 2 hours and no increased work of breathing.   06/21/2020-patient scheduled to get PEG tube today.  Has been n.p.o. for this.  Patient did not tolerate trach collar trial for about 2 hours today.   Lines/tubes : Airway 7.5 mm (Active)  Secured at (cm) 22 cm 06/12/20 0807  Measured From Lips 06/12/20 0807  Secured Location Left 06/12/20 0807  Secured By [employer] 06/12/20 0807  Tube Holder Repositioned Yes 06/12/20 0807  Prone position No 06/12/20 0807  Cuff Pressure (cm H2O) 26 cm H2O 06/12/20 0807  Site Condition Dry;Cool 06/12/20 0807     CVC Triple Lumen 06/10/20 Left Internal jugular (Active)  Indication for Insertion or Continuance of Line Chronic illness with exacerbations (CF, Sickle Cell, etc.) 06/11/20 2100  Site Assessment Clean;Dry;Intact 06/11/20 2100  Proximal Lumen Status Infusing;Flushed;Blood return noted 06/11/20 2100  Medial Lumen Status Infusing 06/11/20 2100  Distal Lumen Status Infusing;Flushed;Blood return noted 06/11/20 2100  Dressing Type Transparent;Occlusive 06/11/20 2100  Dressing Status Clean;Dry;Intact 06/11/20 2100  Antimicrobial disc in place? Yes 06/11/20 2100  Line Care Connections checked and tightened 06/11/20 2100  Dressing Intervention New dressing 06/10/20 2100  Dressing Change Due 06/17/20 06/11/20 2100     NG/OG Tube Orogastric Center mouth Xray (Active)  Cm Marking at Nare/Corner of Mouth (if applicable) 61 cm 12/87/86 2000  Site Assessment Clean;Dry;Intact 06/11/20 2000  Ongoing Placement Verification No change in cm markings or external length of tube from initial placement;No change in respiratory status;No acute changes, not attributed to clinical condition 06/11/20 2000  Status Infusing tube feed 06/11/20 2000  Drainage Appearance None 06/11/20 1600  Intake (mL) 30 mL  06/11/20 1600     Urethral Catheter  Skeet Latch, RN Double-lumen;Latex 14 Fr. (Active)  Indication for Insertion or Continuance of Catheter Therapy based on hourly urine output monitoring and documentation for critical condition (NOT STRICT I&O) 06/12/20 0800  Site Assessment Clean;Intact 06/12/20 0800  Catheter Maintenance Bag below level of bladder;Catheter secured;Drainage bag/tubing not touching floor;Insertion date on drainage bag;No dependent loops;Seal intact 06/12/20 0800  Collection Container Standard drainage bag 06/12/20 0800  Securement Method Securing device (Describe) 06/12/20 0800  Urinary Catheter Interventions (if applicable) Unclamped 02/05/47 0800  Output (mL) 60 mL 06/11/20 1800    Microbiology/Sepsis markers: Results for orders placed or performed during the hospital encounter of 06/06/20  Blood culture (routine x 2)     Status: None   Collection Time: 06/06/20 12:07 PM   Specimen: BLOOD  Result Value Ref Range Status   Specimen Description BLOOD RIGHT [facility]  Final   Special Requests   Final    BOTTLES DRAWN AEROBIC AND ANAEROBIC Blood Culture adequate volume   Culture   Final    NO GROWTH 5 DAYS Performed at [facility], [address], [place], [place] [address]    Report Status 06/11/2020 FINAL  Final  Blood culture (routine x 2)     Status: None   Collection Time: 06/06/20 12:07 PM   Specimen: BLOOD  Result Value Ref Range Status   Specimen Description BLOOD LEFT AC  Final   Special Requests   Final    BOTTLES DRAWN AEROBIC AND ANAEROBIC Blood Culture adequate volume   Culture   Final    NO GROWTH 5 DAYS Performed at [facility], [address] [place], [place] [address]    Report Status 06/11/2020 FINAL  Final  Resp Panel by RT-PCR (Flu A&B, Covid) Nasopharyngeal Swab     Status: None   Collection Time: 06/06/20 12:07 PM   Specimen: Nasopharyngeal Swab; Nasopharyngeal(NP) swabs in vial transport medium  Result Value Ref Range Status   SARS Coronavirus 2 by RT  PCR NEGATIVE NEGATIVE Final    Comment: (NOTE) SARS-CoV-2 target nucleic acids are NOT DETECTED.  The SARS-CoV-2 RNA is generally detectable in upper respiratory specimens during the acute phase of infection. The lowest concentration of SARS-CoV-2 viral copies this assay can detect is 138 copies/mL. A negative result does not preclude SARS-Cov-2 infection and should not be used as the sole basis for treatment or other patient management decisions. A negative result may occur with  improper specimen collection/handling, submission of specimen other than nasopharyngeal swab, presence of viral mutation(s) within the areas targeted by this assay, and inadequate number of viral copies(<138 copies/mL). A negative result must be combined with clinical observations, patient history, and epidemiological information. The expected result is Negative.  Fact Sheet for Patients:  EntrepreneurPulse.com.au  Fact Sheet for Healthcare Providers:  IncredibleEmployment.be  This test is no t yet approved or cleared by the [place] FDA and  has been authorized for detection and/or diagnosis of SARS-CoV-2 by FDA under an Emergency Use Authorization (EUA). This EUA will remain  in effect (meaning this test can be used) for the duration of the COVID-19 declaration under Section 564(b)(1) of the Act, 21 U.S.C.section 360bbb-3(b)(1), unless the authorization is terminated  or revoked sooner.       Influenza A by PCR NEGATIVE NEGATIVE Final   Influenza B by PCR NEGATIVE NEGATIVE Final    Comment: (NOTE) The Xpert Xpress SARS-CoV-2/FLU/RSV plus assay is intended as an aid in the  diagnosis of influenza from Nasopharyngeal swab specimens and should not be used as a sole basis for treatment. Nasal washings and aspirates are unacceptable for Xpert Xpress SARS-CoV-2/FLU/RSV testing.  Fact Sheet for Patients: EntrepreneurPulse.com.au  Fact Sheet for  Healthcare Providers: IncredibleEmployment.be  This test is not yet approved or cleared by the [place] FDA and has been authorized for detection and/or diagnosis of SARS-CoV-2 by FDA under an Emergency Use Authorization (EUA). This EUA will remain in effect (meaning this test can be used) for the duration of the COVID-19 declaration under Section 564(b)(1) of the Act, 21 U.S.C. section 360bbb-3(b)(1), unless the authorization is terminated or revoked.  Performed at [facility], [address] [place], [place] [address]   MRSA PCR Screening     Status: None   Collection Time: 06/06/20 11:00 PM   Specimen: Nasopharyngeal  Result Value Ref Range Status   MRSA by PCR NEGATIVE NEGATIVE Final    Comment:        The GeneXpert MRSA Assay (FDA approved for NASAL specimens only), is one component of a comprehensive MRSA colonization surveillance program. It is not intended to diagnose MRSA infection nor to guide or monitor treatment for MRSA infections. Performed at [facility], [address], [place],  [address]     Anti-infectives:  Anti-infectives (From admission, onward)   Start     Dose/Rate Route Frequency Ordered Stop   06/21/20 1200  ceFAZolin (ANCEF) IVPB 2g/100 mL premix       Note to Pharmacy: Please have at bedside. Will be administered one hour prior to procedure. Endo staff will instruct on when to start infusion.   2 g 200 mL/hr over 30 Minutes Intravenous  Once 06/21/20 0820 06/21/20 1632   06/07/20 1400  azithromycin (ZITHROMAX) 500 mg in sodium chloride 0.9 % 250 mL IVPB  Status:  Discontinued        500 mg 250 mL/hr over 60 Minutes Intravenous Every 24 hours 06/06/20 2030 06/06/20 2044   06/07/20 1400  azithromycin (ZITHROMAX) 500 mg in sodium chloride 0.9 % 250 mL IVPB  Status:  Discontinued        500 mg 250 mL/hr over 60 Minutes Intravenous Every 24 hours 06/06/20 2044 06/08/20 1031   06/06/20 1415   cefTRIAXone (ROCEPHIN) 2 g in sodium chloride 0.9 % 100 mL IVPB        2 g 200 mL/hr over 30 Minutes Intravenous  Once 06/06/20 1407 06/06/20 1514   06/06/20 1415  azithromycin (ZITHROMAX) 500 mg in sodium chloride 0.9 % 250 mL IVPB        500 mg 250 mL/hr over 60 Minutes Intravenous  Once 06/06/20 1407 06/06/20 1708       Consults:   ENT - tracheostomy  PAST MEDICAL HISTORY   Past Medical History:  Diagnosis Date  . Anemia   . Anxiety   . Arthritis   . Atrial fibrillation ([place])   . C. difficile colitis 09/2015  . COPD (chronic obstructive pulmonary disease) ([place])   . Depression   . Dyspnea   . Dysrhythmia   . Fibromyalgia   . H/O tracheostomy   . Hep C w/ coma, chronic   . Hypertension   . MRSA pneumonia ([place]) 2017  . On home oxygen therapy    3 L/M   . Osteoporosis   . Peripheral neuropathy   . RLS (restless legs syndrome)   . S/P percutaneous endoscopic gastrostomy (PEG) tube placement ([place]) 09/2015  . Ventilator  associated pneumonia [facility]) 10/2015   [facility], [place]     SURGICAL HISTORY   Past Surgical History:  Procedure Laterality Date  . ABDOMINAL HYSTERECTOMY    . BREAST SURGERY Bilateral    Breast Implants  . DILATION AND CURETTAGE OF UTERUS    . REVERSE SHOULDER ARTHROPLASTY Right 03/06/2017   Procedure: REVERSE SHOULDER ARTHROPLASTY;  Surgeon: Corky Mull, MD;  Location: [facility] ORS;  Service: Orthopedics;  Laterality: Right;  . ROTATOR CUFF REPAIR Bilateral   . TRACHEOSTOMY TUBE PLACEMENT N/A 06/15/2020   Procedure: TRACHEOSTOMY;  Surgeon: Clyde Canterbury, MD;  Location: [facility] ORS;  Service: ENT;  Laterality: N/A;     FAMILY HISTORY   Family History  Problem Relation Age of Onset  . Hypertension Mother      SOCIAL HISTORY   Social History   Tobacco Use  . Smoking status: Former Smoker    Packs/day: 1.50    Types: Cigarettes    Quit date: 10/05/2015    Years since quitting: 4.7  . Smokeless tobacco: Never Used  Vaping Use   . Vaping Use: Some days  . Last attempt to quit: 10/05/2015  Substance Use Topics  . Alcohol use: No  . Drug use: No     MEDICATIONS   Current Medication:  Current Facility-Administered Medications:  .  0.9 %  sodium chloride infusion (Manually program via Guardrails IV Fluids), , Intravenous, Once, Rust-Chester, Huel Cote, NP, Held at 06/18/20 [phone] .  0.9 %  sodium chloride infusion, 250 mL, Intravenous, Continuous, Duffy Bruce, MD, Stopped at 06/14/20 0800 .  0.9 %  sodium chloride infusion, 250 mL, Intravenous, PRN, Mortimer Fries, Kurian, MD .  0.9 %  sodium chloride infusion, 250 mL, Intravenous, Continuous, Kasa, Kurian, MD, Stopped at 06/12/20 1131 .  acetaminophen (TYLENOL) 160 MG/5ML solution 650 mg, 650 mg, Per Tube, Q4H PRN, Flora Lipps, MD .  chlorhexidine gluconate (MEDLINE KIT) (PERIDEX) 0.12 % solution 15 mL, 15 mL, Mouth Rinse, BID, Kasa, Kurian, MD, 15 mL at 06/21/20 0813 .  Chlorhexidine Gluconate Cloth 2 % PADS 6 each, 6 each, Topical, Daily, Flora Lipps, MD, 6 each at 06/21/20 1216 .  dexmedetomidine (PRECEDEX) 400 MCG/100ML (4 mcg/mL) infusion, 0.4-1.2 mcg/kg/hr, Intravenous, Titrated, Aleskerov, Fuad, MD, Last Rate: 17.94 mL/hr at 06/21/20 1606, 1.2 mcg/kg/hr at 06/21/20 1606 .  dextrose 5 % in lactated ringers infusion, , Intravenous, Continuous, Tyna Jaksch, MD, Last Rate: 75 mL/hr at 06/21/20 0946, New Bag at 06/21/20 0946 .  diazepam (VALIUM) tablet 5 mg, 5 mg, Per Tube, Q8H PRN, Rust-Chester, Britton L, NP, 5 mg at 06/15/20 2143 .  fentaNYL (SUBLIMAZE) bolus via infusion 25 mcg, 25 mcg, Intravenous, Q15 min PRN, Darel Hong D, NP, 25 mcg at 06/21/20 1217 .  fentaNYL 255mg in NS 2547m(1068mml) infusion-PREMIX, 0-400 mcg/hr, Intravenous, Continuous, Kasa, Kurian, MD, Last Rate: 40 mL/hr at 06/21/20 1606, 400 mcg/hr at 06/21/20 1606 .  insulin aspart (novoLOG) injection 0-6 Units, 0-6 Units, Subcutaneous, Q4H, GruDallie PilesPH, 1 Units at 06/20/20  1141 .  ipratropium-albuterol (DUONEB) 0.5-2.5 (3) MG/3ML nebulizer solution 3 mL, 3 mL, Nebulization, Q6H, Blakeney, Dana G, NP, 3 mL at 06/21/20 1519 .  ipratropium-albuterol (DUONEB) 0.5-2.5 (3) MG/3ML nebulizer solution 3 mL, 3 mL, Nebulization, Q6H PRN, BlaAwilda BillP .  labetalol (NORMODYNE) injection 10 mg, 10 mg, Intravenous, Q4H PRN, IzqTyna JakschD .  MEDLINE mouth rinse, 15 mL, Mouth Rinse, 10 times per day, KasFlora LippsD, 15 mL at  06/21/20 1802 .  methylPREDNISolone sodium succinate (SOLU-MEDROL) 40 mg/mL injection 20 mg, 20 mg, Intravenous, Q24H, 20 mg at 06/21/20 0945 **FOLLOWED BY** [START ON 06/27/2020] methylPREDNISolone sodium succinate (SOLU-MEDROL) 40 mg/mL injection 10 mg, 10 mg, Intravenous, Q24H, Tyna Jaksch, MD .  metoprolol tartrate (LOPRESSOR) injection 5 mg, 5 mg, Intravenous, Q3H PRN, Ottie Glazier, MD, 5 mg at 06/17/20 2006 .  metoprolol tartrate (LOPRESSOR) injection 5 mg, 5 mg, Intravenous, Q6H, Tyna Jaksch, MD, 5 mg at 06/21/20 1221 .  midazolam (VERSED) injection 1 mg, 1 mg, Intravenous, Q1H PRN, Rust-Chester, Britton L, NP, 1 mg at 06/21/20 1131 .  ondansetron (ZOFRAN) injection 4 mg, 4 mg, Intravenous, Q6H PRN, Mortimer Fries, Kurian, MD .  pantoprazole (PROTONIX) injection 40 mg, 40 mg, Intravenous, QHS, Dallie Piles, RPH, 40 mg at 06/20/20 2126 .  polyethylene glycol (MIRALAX / GLYCOLAX) packet 17 g, 17 g, Per Tube, Daily PRN, Mortimer Fries, Kurian, MD .  sodium chloride flush (NS) 0.9 % injection 10-40 mL, 10-40 mL, Intracatheter, Q12H, Flora Lipps, MD, 10 mL at 06/21/20 0812 .  sodium chloride flush (NS) 0.9 % injection 10-40 mL, 10-40 mL, Intracatheter, PRN, Mortimer Fries, Kurian, MD .  sodium chloride flush (NS) 0.9 % injection 3 mL, 3 mL, Intravenous, Q12H, Kasa, Kurian, MD, 3 mL at 06/21/20 0946 .  sodium chloride flush (NS) 0.9 % injection 3 mL, 3 mL, Intravenous, PRN, Flora Lipps, MD    ALLERGIES   Patient has no known  allergies.    REVIEW OF SYSTEMS    Unable to obtain on sedation and mechanical ventilation  PHYSICAL EXAMINATION   Vital Signs: Temp:  [97.5 F (36.4 C)-99.1 F (37.3 C)] 98.2 F (36.8 C) (12/29 1600) Pulse Rate:  [58-132] 132 (12/29 1600) Resp:  [13-21] 16 (12/29 1600) BP: (94-180)/(67-103) 122/84 (12/29 1621) SpO2:  [11 %-100 %] 89 % (12/29 1600) FiO2 (%):  [28 %] 28 % (12/29 1221) Weight:  [58.6 kg] 58.6 kg (12/29 0500)  GENERAL:age appropriate sedated on MV HEAD: Normocephalic, atraumatic.  EYES: Pupils equal, round, reactive to light.  No scleral icterus.  MOUTH: Moist mucosal membrane. NECK: Supple. No thyromegaly. No nodules. No JVD.  PULMONARY: Clear breath sounds no evidence of increased work of breathing CARDIOVASCULAR: S1 and S2. Regular rate and rhythm. No murmurs, rubs, or gallops.  GASTROINTESTINAL: Soft, nontender, non-distended. No masses. Positive bowel sounds. No hepatosplenomegaly.  MUSCULOSKELETAL: No swelling, clubbing, or edema.  NEUROLOGIC: Patient alert and interactive trach in place.  Follows commands SKIN:intact,warm,dry   PERTINENT DATA     Infusions: . sodium chloride Stopped (06/14/20 0800)  . sodium chloride    . sodium chloride Stopped (06/12/20 1131)  . dexmedetomidine (PRECEDEX) IV infusion 1.2 mcg/kg/hr (06/21/20 1606)  . dextrose 5% lactated ringers 75 mL/hr at 06/21/20 0946  . fentaNYL infusion INTRAVENOUS 400 mcg/hr (06/21/20 1606)   Scheduled Medications: . sodium chloride   Intravenous Once  . chlorhexidine gluconate (MEDLINE KIT)  15 mL Mouth Rinse BID  . Chlorhexidine Gluconate Cloth  6 each Topical Daily  . insulin aspart  0-6 Units Subcutaneous Q4H  . ipratropium-albuterol  3 mL Nebulization Q6H  . mouth rinse  15 mL Mouth Rinse 10 times per day  . methylPREDNISolone (SOLU-MEDROL) injection  20 mg Intravenous Q24H   Followed by  . [START ON 06/27/2020] methylPREDNISolone (SOLU-MEDROL) injection  10 mg Intravenous Q24H   . metoprolol tartrate  5 mg Intravenous Q6H  . pantoprazole (PROTONIX) IV  40 mg Intravenous QHS  . sodium  chloride flush  10-40 mL Intracatheter Q12H  . sodium chloride flush  3 mL Intravenous Q12H   PRN Medications: sodium chloride, acetaminophen (TYLENOL) oral liquid 160 mg/5 mL, diazepam, fentaNYL, ipratropium-albuterol, labetalol, metoprolol tartrate, midazolam, ondansetron (ZOFRAN) IV, polyethylene glycol, sodium chloride flush, sodium chloride flush Hemodynamic parameters:   Intake/Output: 12/28 0701 - 12/29 0700 In: 10 [I.V.:10] Out: 1490 [Urine:1465; Stool:25]  Ventilator  Settings: Vent Mode: PRVC FiO2 (%):  [28 %] 28 % Set Rate:  [14 bmp] 14 bmp Vt Set:  [420 mL] 420 mL PEEP:  [5 cmH20] 5 cmH20 Plateau Pressure:  [21 cmH20] 21 cmH20   LAB RESULTS:  Basic Metabolic Panel: Recent Labs  Lab 06/17/20 0500 06/18/20 0414 06/19/20 0500 06/20/20 0551 06/21/20 0539  NA 145 148* 142 142 135  K 4.3 3.9 3.4* 3.4* 3.7  CL 99 100 97* 99 96*  CO2 41* 39* 33* 36* 34*  GLUCOSE 158* 87 75 128* 129*  BUN 31* 24* '19 13 8  ' CREATININE 0.44 0.55 0.68 0.55 0.48  CALCIUM 8.7* 8.7* 8.5* 8.3* 8.3*  MG 2.3 2.2 2.0 1.8 2.1  PHOS 3.0 3.1 2.8 3.5 3.3   Liver Function Tests: No results for input(s): AST, ALT, ALKPHOS, BILITOT, PROT, ALBUMIN in the last 168 hours. No results for input(s): LIPASE, AMYLASE in the last 168 hours. No results for input(s): AMMONIA in the last 168 hours. CBC: Recent Labs  Lab 06/16/20 0440 06/17/20 0500 06/18/20 0414 06/18/20 1700 06/19/20 0500 06/20/20 0551  WBC 19.3* 14.1* 10.6*  --  9.2 8.3  HGB 7.9* 7.6* 6.9* 8.6* 8.0* 7.7*  HCT 25.4* 25.1* 23.3* 27.4* 26.0* 25.0*  MCV 102.0* 102.9* 103.6*  --  96.3 96.5  PLT 256 245 203  --  171 151   Cardiac Enzymes: No results for input(s): CKTOTAL, CKMB, CKMBINDEX, TROPONINI in the last 168 hours. BNP: Invalid input(s): POCBNP CBG: Recent Labs  Lab 06/20/20 2359 06/21/20 0511 06/21/20 0729  06/21/20 1139 06/21/20 1751  GLUCAP 96 101* 97 126* 125*       IMAGING RESULTS:  Imaging: No results found. '@PROBHOSP' @ No results found.   ASSESSMENT AND PLAN    -Multidisciplinary rounds held today  Acute Hypoxic Respiratory Failure -FiO2 is at 30% today -AECOPD  -continue Full MV support -continue Bronchodilator Therapy -Wean Fio2 and PEEP as tolerated -will perform SAT/SBT when respiratory parameters are met -s/p KUB - OGT advanced as indicated by radiologist -s/p Tracheostomy -Do trach collar for at least 1 hour increments as patient tolerates and will extend time. On these trials over the next 24 to 48 hours   Acute on chronic diastolic CHF -Patient appears euvolemic at this time. No specific interventions indicated   GI/Nutrition GI PROPHYLAXIS as indicated.  DIET-->TF's as tolerated Constipation protocol as indicated -PEG tube placement tentatively for 06-21-2020 -Have ordered that prophylactic Lovenox be held for procedure  ENDO - ICU hypoglycemic\Hyperglycemia protocol -check FSBS per protocol   ELECTROLYTES -follow labs as needed -replace as needed -pharmacy consultation   DVT/GI PRX ordered -SCDs  TRANSFUSIONS AS NEEDED MONITOR FSBS ASSESS the need for LABS as needed   Critical care provider statement:    Critical care time (minutes):  35   Critical care time was exclusive of:  Separately billable procedures and treating other patients   Critical care was necessary to treat or prevent imminent or life-threatening deterioration of the following conditions:  acute hypoxemic respiratory failure, AECOPD, multiple comorbid conditions   Critical care was time spent personally by  me on the following activities:  Development of treatment plan with patient or surrogate, discussions with consultants, evaluation of patient's response to treatment, examination of patient, obtaining history from patient or surrogate, ordering and performing treatments  and interventions, ordering and review of laboratory studies and re-evaluation of patient's condition.  I assumed direction of critical care for this patient from another provider in my specialty: no    This document was prepared using Dragon voice recognition software and may include unintentional dictation errors.    Newell Coral DO Internal Medicine/Pediatrics Pulmonary and Critical Care Fellow PGY-7

## 2020-06-22 ENCOUNTER — Encounter: Payer: Self-pay | Admitting: Gastroenterology

## 2020-06-22 DIAGNOSIS — J441 Chronic obstructive pulmonary disease with (acute) exacerbation: Secondary | ICD-10-CM | POA: Diagnosis not present

## 2020-06-22 DIAGNOSIS — J9601 Acute respiratory failure with hypoxia: Secondary | ICD-10-CM | POA: Diagnosis not present

## 2020-06-22 LAB — BASIC METABOLIC PANEL
Anion gap: 9 (ref 5–15)
BUN: 5 mg/dL — ABNORMAL LOW (ref 8–23)
CO2: 32 mmol/L (ref 22–32)
Calcium: 8.3 mg/dL — ABNORMAL LOW (ref 8.9–10.3)
Chloride: 94 mmol/L — ABNORMAL LOW (ref 98–111)
Creatinine, Ser: 0.45 mg/dL (ref 0.44–1.00)
GFR, Estimated: >60 mL/min (ref >60)
Glucose, Bld: 101 mg/dL — ABNORMAL HIGH (ref 70–99)
Potassium: 3.5 mmol/L (ref 3.5–5.1)
Sodium: 135 mmol/L (ref 135–145)

## 2020-06-22 LAB — CBC WITH DIFFERENTIAL/PLATELET
Abs Immature Granulocytes: 0.04 10*3/uL (ref 0.00–0.07)
Basophils Absolute: 0 10*3/uL (ref 0.0–0.1)
Basophils Relative: 0 %
Eosinophils Absolute: 0.3 10*3/uL (ref 0.0–0.5)
Eosinophils Relative: 3 %
HCT: 29.2 % — ABNORMAL LOW (ref 36.0–46.0)
Hemoglobin: 9.2 g/dL — ABNORMAL LOW (ref 12.0–15.0)
Immature Granulocytes: 0 %
Lymphocytes Relative: 6 %
Lymphs Abs: 0.6 10*3/uL — ABNORMAL LOW (ref 0.7–4.0)
MCH: 29.9 pg (ref 26.0–34.0)
MCHC: 31.5 g/dL (ref 30.0–36.0)
MCV: 94.8 fL (ref 80.0–100.0)
Monocytes Absolute: 0.5 10*3/uL (ref 0.1–1.0)
Monocytes Relative: 5 %
Neutro Abs: 8.8 10*3/uL — ABNORMAL HIGH (ref 1.7–7.7)
Neutrophils Relative %: 86 %
Platelets: 180 10*3/uL (ref 150–400)
RBC: 3.08 MIL/uL — ABNORMAL LOW (ref 3.87–5.11)
RDW: 15 % (ref 11.5–15.5)
WBC: 10.2 10*3/uL (ref 4.0–10.5)
nRBC: 0 % (ref 0.0–0.2)

## 2020-06-22 LAB — GLUCOSE, CAPILLARY
Glucose-Capillary: 100 mg/dL — ABNORMAL HIGH (ref 70–99)
Glucose-Capillary: 119 mg/dL — ABNORMAL HIGH (ref 70–99)
Glucose-Capillary: 119 mg/dL — ABNORMAL HIGH (ref 70–99)
Glucose-Capillary: 138 mg/dL — ABNORMAL HIGH (ref 70–99)
Glucose-Capillary: 91 mg/dL (ref 70–99)
Glucose-Capillary: 91 mg/dL (ref 70–99)

## 2020-06-22 LAB — MAGNESIUM: Magnesium: 1.6 mg/dL — ABNORMAL LOW (ref 1.7–2.4)

## 2020-06-22 LAB — PHOSPHORUS: Phosphorus: 3.3 mg/dL (ref 2.5–4.6)

## 2020-06-22 MED ORDER — CLONIDINE HCL 0.1 MG PO TABS
0.1000 mg | ORAL_TABLET | Freq: Every day | ORAL | Status: AC
  Administered 2020-06-27 – 2020-06-28 (×2): 0.1 mg
  Filled 2020-06-22 (×2): qty 1

## 2020-06-22 MED ORDER — REVEFENACIN 175 MCG/3ML IN SOLN
175.0000 ug | Freq: Every day | RESPIRATORY_TRACT | Status: DC
  Administered 2020-06-25 – 2020-06-26 (×2): 175 ug via RESPIRATORY_TRACT
  Filled 2020-06-22 (×4): qty 3

## 2020-06-22 MED ORDER — FENTANYL 2500MCG IN NS 250ML (10MCG/ML) PREMIX INFUSION
0.0000 ug/h | INTRAVENOUS | Status: DC
  Administered 2020-06-22: 300 ug/h via INTRAVENOUS

## 2020-06-22 MED ORDER — FREE WATER: 90.0000 mL | Status: DC

## 2020-06-22 MED ORDER — APIXABAN 5 MG PO TABS
5.0000 mg | ORAL_TABLET | Freq: Two times a day (BID) | ORAL | Status: DC
  Administered 2020-06-22 – 2020-06-23 (×3): 5 mg
  Filled 2020-06-22 (×3): qty 1

## 2020-06-22 MED ORDER — JEVITY 1.2 CAL PO LIQD
1000.0000 mL | ORAL | Status: DC
  Administered 2020-06-22 – 2020-06-25 (×3): 1000 mL

## 2020-06-22 MED ORDER — MIDAZOLAM HCL 2 MG/2ML IJ SOLN
2.0000 mg | Freq: Once | INTRAMUSCULAR | Status: AC
  Administered 2020-06-22: 2 mg via INTRAVENOUS

## 2020-06-22 MED ORDER — FENTANYL 2500MCG IN NS 250ML (10MCG/ML) PREMIX INFUSION
INTRAVENOUS | Status: AC
  Filled 2020-06-22: qty 250

## 2020-06-22 MED ORDER — CLONIDINE HCL 0.1 MG PO TABS: 0.1000 mg | ORAL_TABLET | Freq: Every day | ORAL | Status: DC

## 2020-06-22 MED ORDER — PREDNISONE 10 MG PO TABS
10.0000 mg | ORAL_TABLET | Freq: Every day | ORAL | Status: AC
  Administered 2020-06-23 – 2020-06-27 (×5): 10 mg
  Filled 2020-06-22 (×5): qty 1

## 2020-06-22 MED ORDER — MIDAZOLAM HCL 2 MG/2ML IJ SOLN
INTRAMUSCULAR | Status: AC
  Filled 2020-06-22: qty 2

## 2020-06-22 MED ORDER — POTASSIUM CHLORIDE 20 MEQ PO PACK
40.0000 meq | PACK | ORAL | Status: AC
  Administered 2020-06-22 (×2): 40 meq
  Filled 2020-06-22 (×2): qty 2

## 2020-06-22 MED ORDER — ARFORMOTEROL TARTRATE 15 MCG/2ML IN NEBU
15.0000 ug | INHALATION_SOLUTION | Freq: Two times a day (BID) | RESPIRATORY_TRACT | Status: DC
  Administered 2020-06-22 – 2020-08-11 (×88): 15 ug via RESPIRATORY_TRACT
  Filled 2020-06-22 (×105): qty 2

## 2020-06-22 MED ORDER — CLONIDINE HCL 0.1 MG PO TABS
0.1000 mg | ORAL_TABLET | Freq: Three times a day (TID) | ORAL | Status: AC
  Administered 2020-06-24 – 2020-06-26 (×6): 0.1 mg
  Filled 2020-06-22 (×6): qty 1

## 2020-06-22 MED ORDER — FENTANYL CITRATE (PF) 100 MCG/2ML IJ SOLN
100.0000 ug | Freq: Once | INTRAMUSCULAR | Status: AC
  Administered 2020-06-22: 100 ug via INTRAVENOUS

## 2020-06-22 MED ORDER — CLONIDINE HCL 0.1 MG PO TABS: 0.1000 mg | ORAL_TABLET | Freq: Three times a day (TID) | ORAL | Status: DC

## 2020-06-22 MED ORDER — FENTANYL CITRATE (PF) 100 MCG/2ML IJ SOLN
INTRAMUSCULAR | Status: AC
  Filled 2020-06-22: qty 2

## 2020-06-22 MED ORDER — CLONAZEPAM 1 MG PO TABS
1.0000 mg | ORAL_TABLET | Freq: Two times a day (BID) | ORAL | Status: DC
  Administered 2020-06-22 – 2020-07-27 (×72): 1 mg
  Filled 2020-06-22 (×73): qty 1

## 2020-06-22 MED ORDER — CLONIDINE HCL 0.1 MG PO TABS
0.2000 mg | ORAL_TABLET | Freq: Three times a day (TID) | ORAL | Status: AC
  Administered 2020-06-22 – 2020-06-24 (×5): 0.2 mg
  Filled 2020-06-22 (×4): qty 2

## 2020-06-22 MED ORDER — CHLORHEXIDINE GLUCONATE 0.12 % MT SOLN
OROMUCOSAL | Status: AC
  Administered 2020-06-22: 15 mL via OROMUCOSAL
  Filled 2020-06-22: qty 15

## 2020-06-22 MED ORDER — HYDRALAZINE HCL 20 MG/ML IJ SOLN
10.0000 mg | Freq: Once | INTRAMUSCULAR | Status: AC
  Administered 2020-06-22: 10 mg via INTRAVENOUS
  Filled 2020-06-22: qty 1

## 2020-06-22 MED ORDER — MAGNESIUM SULFATE 4 GM/100ML IV SOLN
4.0000 g | Freq: Once | INTRAVENOUS | Status: AC
  Administered 2020-06-22: 4 g via INTRAVENOUS
  Filled 2020-06-22 (×2): qty 100

## 2020-06-22 MED ORDER — CLONIDINE HCL 0.1 MG PO TABS
0.2000 mg | ORAL_TABLET | Freq: Three times a day (TID) | ORAL | Status: DC
  Administered 2020-06-22: 0.2 mg via ORAL
  Filled 2020-06-22: qty 2

## 2020-06-22 MED ORDER — PROSOURCE TF PO LIQD
45.0000 mL | Freq: Every day | ORAL | Status: DC
  Administered 2020-06-22 – 2020-06-28 (×7): 45 mL
  Filled 2020-06-22 (×7): qty 45

## 2020-06-22 MED ORDER — LACTATED RINGERS IV SOLN: INTRAVENOUS | Status: DC

## 2020-06-22 MED ORDER — OXYCODONE HCL 5 MG PO TABS
10.0000 mg | ORAL_TABLET | ORAL | Status: DC | PRN
  Administered 2020-06-23 – 2020-07-17 (×10): 10 mg
  Filled 2020-06-22 (×10): qty 2

## 2020-06-22 MED ORDER — METOPROLOL TARTRATE 50 MG PO TABS
50.0000 mg | ORAL_TABLET | Freq: Two times a day (BID) | ORAL | Status: DC
  Administered 2020-06-23 – 2020-07-19 (×52): 50 mg
  Filled 2020-06-22 (×53): qty 1

## 2020-06-22 NOTE — Progress Notes (Signed)
   CRITICAL CARE PROGRESS NOTE    Name: Virginia [name] MRN: [id number] DOB: 02-01-1951     LOS: 16   SUBJECTIVE FINDINGS & SIGNIFICANT EVENTS    Patient description:  69 yo female with severe COPD admitted with acute on chronic hypoxic hypercapnic respiratory failure and acute encephalopathy secondary to narcotic use and AECOPD requiring mechanical intubation  Now s/p trach/PEG working on trach weaning.  On TC at present, anxious. Remains on precedex.  PHYSICAL EXAMINATION   Vital Signs: Temp:  [97.5 F (36.4 C)-98.4 F (36.9 C)] 98 F (36.7 C) (12/30 0400) Pulse Rate:  [57-132] 77 (12/30 0700) Resp:  [12-21] 13 (12/30 1000) BP: (102-187)/(54-118) 153/71 (12/30 1000) SpO2:  [87 %-100 %] 96 % (12/30 0800) FiO2 (%):  [28 %] 28 % (12/30 0939)  Constitutional: anxious frail woman lying in bed  Eyes: eomi, pupils equal Ears, nose, mouth, and throat: trach in place, minmal secretions Cardiovascular: RRR,  Ext warm Respiratory: diminished with prolonged expiratory phase, occasional accessory muscle use Gastrointestinal: soft, abd wrapped after PEG MSK: muscle wasting Skin: No rashes, normal turgor, scattered bruising Neurologic: moves all 4 ext to command, weak Psychiatric: anxious, answering questions appropriately   K/mg low- repleting Cr okay CBC benign   ASSESSMENT AND PLAN  Acute on chronic hypoxemic and hypercapnic respiratory failure thought secondary to polypharmacy with inability to wean from ventilator due to muscular deconditioning now s/p tracheostomy.  Dysphagia, moderate protein calorie malnutrition- now s/p PEG  Severe baseline COPD- complicated trach wean  Muscular deconditioning  Chronic anxiety/depression  Metabolic encephalopathy/agitation- improving but remains on  precedex gtt  - Wean precedex, use clonidine taper as bridge - DC fentanyl gtt, use PRN oxycodone instead - Switch IV steroids to PO steroids x 5 more days then stop - Switch from standing duonebs from duonebs to brovana/yupelri - PT/OT up to chair - Start standing clonazepam, use valium per tube for breakthrough, stop IV versed - SLP for PMV trials and ?PO - Work toward getting off IV meds so she can be candidate for rehab vs. SNF vs. LTACH   Patient critically ill due to respiratory failure, metabolic encephalopathy Interventions to address this today weaning precedex, weaning vent Risk of deterioration without these interventions is high  I personally spent 35 minutes providing critical care not including any separately billable procedures  Myrla Halsted MD [place] [facility] 06/22/2020 12:06 PM Personal pager: [phone] If unanswered, please page CCM On-call: #[phone]

## 2020-06-22 NOTE — Evaluation (Signed)
Physical Therapy Evaluation Patient Details Name: Virginia [name] MRN: [id number] DOB: 26-May-1951 Today's Date: 06/22/2020   History of Present Illness  69 yo female with severe COPD admitted with acute on chronic hypoxic hypercapnic respiratory failure and acute encephalopathy secondary to narcotic use and AECOPD requiring mechanical intubation. Initially intubated from 12/14-12/16 and then again from 12/18-12/23. Now with + trach collar. Recent hospitalization from 12/10-12/12/21.  Clinical Impression  Pt is a pleasant 69 year old female who was admitted for acute on chronic hypoxic hypercapnic respiratory failure. Pt performs bed mobility with max assist +2 and able to tolerate seated at EOB. Unable to further tolerate mobility efforts at this time. Pt demonstrates deficits with pain/endurance/mobility. Currently not at baseline level at this time. Would benefit from skilled PT to address above deficits and promote optimal return to PLOF; recommend transition to STR upon discharge from acute hospitalization.     Follow Up Recommendations SNF    Equipment Recommendations   (TBD)    Recommendations for Other Services       Precautions / Restrictions Precautions Precautions: Fall Restrictions Weight Bearing Restrictions: No      Mobility  Bed Mobility Overal bed mobility: Needs Assistance Bed Mobility: Supine to Sit;Sit to Supine     Supine to sit: Max assist;+2 for physical assistance;+2 for safety/equipment Sit to supine: Max assist;+2 for physical assistance;+2 for safety/equipment   General bed mobility comments: needs assist for initiating mobility. Once seated at EOB, heavy post lean noted due to pain in abdomen.    Transfers                 General transfer comment: unable to attempt  Ambulation/Gait                Stairs            Wheelchair Mobility    Modified Rankin (Stroke Patients Only)       Balance Overall balance  assessment: Needs assistance Sitting-balance support: Single extremity supported;Feet supported Sitting balance-Leahy Scale: Poor Sitting balance - Comments: MAX A Postural control: Posterior lean                                   Pertinent Vitals/Pain Pain Assessment: Faces Faces Pain Scale: Hurts even more Pain Location: abdomen - G tube site Pain Descriptors / Indicators: Dull;Grimacing;Discomfort Pain Intervention(s): Limited activity within patient's tolerance    Home Living Family/patient expects to be discharged to:: Private residence Living Arrangements: Alone Available Help at Discharge: Family;Available PRN/intermittently Type of Home: Apartment Home Access: Level entry     Home Layout: One level Home Equipment: Emergency planning/management officer - 4 wheels      Prior Function Level of Independence: Independent with assistive device(s)         Comments: MOD I using RW including driving     Hand Dominance   Dominant Hand: Right    Extremity/Trunk Assessment   Upper Extremity Assessment Upper Extremity Assessment: Defer to OT evaluation    Lower Extremity Assessment Lower Extremity Assessment: Generalized weakness (B LE grossly 3/5; very effortful)       Communication   Communication: Tracheostomy  Cognition Arousal/Alertness: Awake/alert Behavior During Therapy: WFL for tasks assessed/performed Overall Cognitive Status: Within Functional Limits for tasks assessed  General Comments General comments (skin integrity, edema, etc.): VSS t/o, RN in room during session to assess    Exercises Other Exercises Other Exercises: Pt educated re: OT role, DME recs, d/c recs, falls prevention, HEP Other Exercises: LBD, oral care, sup<>sit, sitting/standing balance/tolerance Other Exercises: supine ther-ex performed on B LE including AP, quad sets, and SLRs. All ther-ex performed x 5 reps with cues for  breathing. Sats at 96-100% on 5L of O2 through trach collar   Assessment/Plan    PT Assessment Patient needs continued PT services  PT Problem List Decreased activity tolerance;Decreased balance;Cardiopulmonary status limiting activity       PT Treatment Interventions Gait training;Therapeutic exercise;Balance training;Therapeutic activities    PT Goals (Current goals can be found in the Care Plan section)  Acute Rehab PT Goals Patient Stated Goal: To return home safely PT Goal Formulation: With patient Time For Goal Achievement: 07/06/20 Potential to Achieve Goals: Good    Frequency Min 2X/week   Barriers to discharge        Co-evaluation PT/OT/SLP Co-Evaluation/Treatment: Yes Reason for Co-Treatment: Complexity of the patient's impairments (multi-system involvement);To address functional/ADL transfers PT goals addressed during session: Mobility/safety with mobility;Balance;Strengthening/ROM OT goals addressed during session: ADL's and self-care       AM-PAC PT "6 Clicks" Mobility  Outcome Measure Help needed turning from your back to your side while in a flat bed without using bedrails?: A Lot Help needed moving from lying on your back to sitting on the side of a flat bed without using bedrails?: A Lot Help needed moving to and from a bed to a chair (including a wheelchair)?: Total Help needed standing up from a chair using your arms (e.g., wheelchair or bedside chair)?: Total Help needed to walk in hospital room?: Total Help needed climbing 3-5 steps with a railing? : Total 6 Click Score: 8    End of Session Equipment Utilized During Treatment: Oxygen Activity Tolerance: Patient tolerated treatment well Patient left: in bed Nurse Communication: Mobility status PT Visit Diagnosis: Muscle weakness (generalized) (M62.81);Difficulty in walking, not elsewhere classified (R26.2)    Time: 7494-4967 PT Time Calculation (min) (ACUTE ONLY): 35 min   Charges:   PT  Evaluation $PT Eval Moderate Complexity: 1 Mod PT Treatments $Therapeutic Exercise: 8-22 mins        Elizabeth Palau, PT, DPT [phone]   Albaraa Swingle 06/22/2020, 5:10 PM

## 2020-06-22 NOTE — Evaluation (Signed)
Occupational Therapy Evaluation Patient Details Name: Virginia [name] MRN: [id number] DOB: [birth year] Today's Date: 06/22/2020    History of Present Illness 69 yo female with severe COPD admitted with acute on chronic hypoxic hypercapnic respiratory failure and acute encephalopathy secondary to narcotic use and AECOPD requiring mechanical intubation   Clinical Impression   Virginia [name] was seen for OT/PT co-evaluation this date. Prior to hospital admission, pt was MOD I for mobility and ADLs using RW and 3-4L [place]. Pt lives alone in level entry apartment, endorses family could be available PRN. Pt presents to acute OT demonstrating impaired ADL performance and functional mobility 2/2 decreased activity tolerance, functional strength/balance/ROM deficits, pain, and poor insight into deificts.   RN in room agreeable to session, remained t/o to assess vitals. SpO2 stable t/o 95-100% on 5 L trach collar. Pt currently requires MAX A don B socks at bed level. MAX A x2 for bed mobility. SETUP + MAX A oral care seated EOB - assist for sitting balance. Pt would benefit from skilled OT to address noted impairments and functional limitations (see below for any additional details) in order to maximize safety and independence while minimizing falls risk and caregiver burden. Upon hospital discharge, recommend STR to maximize pt safety and return to PLOF.     Follow Up Recommendations  SNF    Equipment Recommendations  Other (comment) (TBD)    Recommendations for Other Services       Precautions / Restrictions Precautions Precautions: Fall Restrictions Weight Bearing Restrictions: No      Mobility Bed Mobility Overal bed mobility: Needs Assistance Bed Mobility: Supine to Sit;Sit to Supine     Supine to sit: Max assist;+2 for physical assistance;+2 for safety/equipment Sit to supine: Max assist;+2 for physical assistance;+2 for safety/equipment        Transfers                  General transfer comment: fatigued and pain from sitting - not tested    Balance Overall balance assessment: Needs assistance Sitting-balance support: Single extremity supported;Feet supported Sitting balance-Leahy Scale: Poor Sitting balance - Comments: MAX A Postural control: Posterior lean                                 ADL either performed or assessed with clinical judgement   ADL Overall ADL's : Needs assistance/impaired                                       General ADL Comments: SETUP + MAX A oral care seated EOB - assist for sitting balance. MAX A don B socks at bed level                  Pertinent Vitals/Pain Pain Assessment: Faces Faces Pain Scale: Hurts even more Pain Location: abdomen - G tube site Pain Descriptors / Indicators: Dull;Grimacing;Discomfort Pain Intervention(s): Limited activity within patient's tolerance;Premedicated before session;Repositioned     Hand Dominance Right   Extremity/Trunk Assessment Upper Extremity Assessment Upper Extremity Assessment: Generalized weakness (limited B shoulder flexion, strength 3/5 grossly. Endorses mild numbness)   Lower Extremity Assessment Lower Extremity Assessment: Generalized weakness       Communication Communication Communication: Tracheostomy   Cognition Arousal/Alertness: Awake/alert Behavior During Therapy: WFL for tasks assessed/performed Overall Cognitive Status: Within Functional Limits for tasks assessed  General Comments  VSS t/o, RN in room during session to assess    Exercises Exercises: Other exercises Other Exercises Other Exercises: Pt educated re: OT role, DME recs, d/c recs, falls prevention, HEP Other Exercises: LBD, oral care, sup<>sit, sitting/standing balance/tolerance   Shoulder Instructions      Home Living Family/patient expects to be discharged to:: Private residence Living  Arrangements: Alone Available Help at Discharge: Family;Available PRN/intermittently Type of Home: Apartment Home Access: Level entry     Home Layout: One level     Bathroom Shower/Tub: Tub/shower unit         Home Equipment: Shower seat          Prior Functioning/Environment Level of Independence: Independent with assistive device(s)        Comments: MOD I using RW including driving        OT Problem List: Decreased strength;Decreased range of motion;Decreased activity tolerance;Impaired balance (sitting and/or standing);Decreased cognition;Decreased safety awareness;Pain      OT Treatment/Interventions: Self-care/ADL training;Therapeutic exercise;Energy conservation;DME and/or AE instruction;Therapeutic activities;Patient/family education;Balance training    OT Goals(Current goals can be found in the care plan section) Acute Rehab OT Goals Patient Stated Goal: To return home safely OT Goal Formulation: With patient Time For Goal Achievement: 07/06/20 Potential to Achieve Goals: Good ADL Goals Pt Will Perform Grooming: with min assist;sitting Pt Will Perform Lower Body Dressing: with mod assist;sitting/lateral leans Pt Will Transfer to Toilet: with mod assist;stand pivot transfer;bedside commode (c LRAD PRN)  OT Frequency: Min 1X/week   Barriers to D/C: Decreased caregiver support          Co-evaluation PT/OT/SLP Co-Evaluation/Treatment: Yes Reason for Co-Treatment: Complexity of the patient's impairments (multi-system involvement);For patient/therapist safety;To address functional/ADL transfers PT goals addressed during session: Mobility/safety with mobility;Strengthening/ROM OT goals addressed during session: ADL's and self-care      AM-PAC OT "6 Clicks" Daily Activity     Outcome Measure Help from another person eating meals?: A Little Help from another person taking care of personal grooming?: A Lot Help from another person toileting, which includes  using toliet, bedpan, or urinal?: A Lot Help from another person bathing (including washing, rinsing, drying)?: A Lot Help from another person to put on and taking off regular upper body clothing?: A Lot Help from another person to put on and taking off regular lower body clothing?: A Lot 6 Click Score: 13   End of Session Equipment Utilized During Treatment: Oxygen;Other (comment) (trach collar) Nurse Communication: Mobility status  Activity Tolerance: Patient tolerated treatment well Patient left: in bed;with call bell/phone within reach;with nursing/sitter in room  OT Visit Diagnosis: Other abnormalities of gait and mobility (R26.89);Muscle weakness (generalized) (M62.81)                Time: 3220-2542 OT Time Calculation (min): 31 min Charges:  OT General Charges $OT Visit: 1 Visit OT Evaluation $OT Eval Moderate Complexity: 1 Mod OT Treatments $Self Care/Home Management : 8-22 mins   Kathie Dike, M.S. OTR/L  06/22/20, 4:35 PM  ascom [phone]

## 2020-06-22 NOTE — Consult Note (Signed)
PHARMACY CONSULT NOTE  Pharmacy Consult for Electrolyte Monitoring and Replacement   Recent Labs: Potassium (mmol/L)  Date Value  06/22/2020 3.5   Magnesium (mg/dL)  Date Value  85/63/1497 1.6 (L)   Calcium (mg/dL)  Date Value  02/63/7858 8.3 (L)   Albumin (g/dL)  Date Value  85/07/7739 3.0 (L)   Phosphorus (mg/dL)  Date Value  28/78/6767 3.3   Sodium (mmol/L)  Date Value  06/22/2020 135   Assessment: 69 yo female admitted with acute on chronic hypoxic hypercapnic respiratory failure and acute encephalopathy secondary to narcotic use, AECOPD requiring mechanical intubation. Patient had PEG tube placed 12/29.  MIVF: LR at 50 mL/hr  Goal of Therapy:  Potassium 4.0 - 5.1 mmol/L Magnesium 2.0 - 2.4 mg/dL All Other Electrolytes within normal limits  Plan:   Mg 1.6, provider has ordered IV magnesium sulfate 4 g x 1  No further electrolyte replacement warranted at this time  Next labs 12/31 AM  Tressie Ellis 06/22/2020 8:57 AM

## 2020-06-23 DIAGNOSIS — J9621 Acute and chronic respiratory failure with hypoxia: Secondary | ICD-10-CM | POA: Diagnosis not present

## 2020-06-23 DIAGNOSIS — J441 Chronic obstructive pulmonary disease with (acute) exacerbation: Secondary | ICD-10-CM | POA: Diagnosis not present

## 2020-06-23 DIAGNOSIS — J9622 Acute and chronic respiratory failure with hypercapnia: Secondary | ICD-10-CM | POA: Diagnosis not present

## 2020-06-23 LAB — CBC WITH DIFFERENTIAL/PLATELET
Abs Immature Granulocytes: 0.06 10*3/uL (ref 0.00–0.07)
Basophils Absolute: 0 10*3/uL (ref 0.0–0.1)
Basophils Relative: 0 %
Eosinophils Absolute: 0.2 10*3/uL (ref 0.0–0.5)
Eosinophils Relative: 2 %
HCT: 29.9 % — ABNORMAL LOW (ref 36.0–46.0)
Hemoglobin: 9.3 g/dL — ABNORMAL LOW (ref 12.0–15.0)
Immature Granulocytes: 1 %
Lymphocytes Relative: 2 %
Lymphs Abs: 0.2 10*3/uL — ABNORMAL LOW (ref 0.7–4.0)
MCH: 30 pg (ref 26.0–34.0)
MCHC: 31.1 g/dL (ref 30.0–36.0)
MCV: 96.5 fL (ref 80.0–100.0)
Monocytes Absolute: 0.3 10*3/uL (ref 0.1–1.0)
Monocytes Relative: 3 %
Neutro Abs: 9.1 10*3/uL — ABNORMAL HIGH (ref 1.7–7.7)
Neutrophils Relative %: 92 %
Platelets: 162 10*3/uL (ref 150–400)
RBC: 3.1 MIL/uL — ABNORMAL LOW (ref 3.87–5.11)
RDW: 15.2 % (ref 11.5–15.5)
WBC: 9.8 10*3/uL (ref 4.0–10.5)
nRBC: 0 % (ref 0.0–0.2)

## 2020-06-23 LAB — GLUCOSE, CAPILLARY
Glucose-Capillary: 107 mg/dL — ABNORMAL HIGH (ref 70–99)
Glucose-Capillary: 148 mg/dL — ABNORMAL HIGH (ref 70–99)
Glucose-Capillary: 144 mg/dL — ABNORMAL HIGH (ref 70–99)
Glucose-Capillary: 129 mg/dL — ABNORMAL HIGH (ref 70–99)
Glucose-Capillary: 120 mg/dL — ABNORMAL HIGH (ref 70–99)
Glucose-Capillary: 125 mg/dL — ABNORMAL HIGH (ref 70–99)

## 2020-06-23 LAB — MAGNESIUM: Magnesium: 2.4 mg/dL (ref 1.7–2.4)

## 2020-06-23 LAB — PHOSPHORUS: Phosphorus: 2.9 mg/dL (ref 2.5–4.6)

## 2020-06-23 LAB — BASIC METABOLIC PANEL
Anion gap: 5 (ref 5–15)
BUN: 9 mg/dL (ref 8–23)
CO2: 34 mmol/L — ABNORMAL HIGH (ref 22–32)
Calcium: 8.2 mg/dL — ABNORMAL LOW (ref 8.9–10.3)
Chloride: 99 mmol/L (ref 98–111)
Creatinine, Ser: 0.58 mg/dL (ref 0.44–1.00)
GFR, Estimated: >60 mL/min (ref >60)
Glucose, Bld: 156 mg/dL — ABNORMAL HIGH (ref 70–99)
Potassium: 4.4 mmol/L (ref 3.5–5.1)
Sodium: 138 mmol/L (ref 135–145)

## 2020-06-23 MED ORDER — QUETIAPINE FUMARATE 25 MG PO TABS
25.0000 mg | ORAL_TABLET | Freq: Every day | ORAL | Status: DC
  Administered 2020-06-23 – 2020-07-27 (×35): 25 mg
  Filled 2020-06-23 (×35): qty 1

## 2020-06-23 MED ORDER — GABAPENTIN 300 MG PO CAPS
300.0000 mg | ORAL_CAPSULE | Freq: Three times a day (TID) | ORAL | Status: DC
  Administered 2020-06-23 – 2020-07-27 (×103): 300 mg
  Filled 2020-06-23 (×41): qty 1
  Filled 2020-06-23: qty 3
  Filled 2020-06-23 (×47): qty 1
  Filled 2020-06-23: qty 3
  Filled 2020-06-23 (×9): qty 1
  Filled 2020-06-23: qty 3
  Filled 2020-06-23 (×4): qty 1

## 2020-06-23 MED ORDER — MIDAZOLAM HCL 2 MG/2ML IJ SOLN
INTRAMUSCULAR | Status: AC
  Filled 2020-06-23: qty 2

## 2020-06-23 MED ORDER — SODIUM CHLORIDE 0.9% FLUSH
10.0000 mL | INTRAVENOUS | Status: DC | PRN
  Administered 2020-06-30: 10 mL

## 2020-06-23 MED ORDER — HYDROMORPHONE HCL 1 MG/ML IJ SOLN: 1.0000 mg | Freq: Once | INTRAMUSCULAR | Status: DC

## 2020-06-23 MED ORDER — SODIUM CHLORIDE 0.9% FLUSH
10.0000 mL | Freq: Two times a day (BID) | INTRAVENOUS | Status: DC
  Administered 2020-06-23 – 2020-06-27 (×9): 10 mL
  Administered 2020-06-28: 20 mL
  Administered 2020-06-28: 30 mL
  Administered 2020-06-29 – 2020-07-01 (×5): 10 mL
  Administered 2020-07-01: 40 mL
  Administered 2020-07-02 – 2020-07-06 (×9): 10 mL
  Administered 2020-07-06: 30 mL
  Administered 2020-07-07 – 2020-07-08 (×4): 10 mL
  Administered 2020-07-10: 30 mL
  Administered 2020-07-10: 10 mL
  Administered 2020-07-10: 40 mL
  Administered 2020-07-11 – 2020-07-17 (×14): 10 mL
  Administered 2020-07-18: 30 mL
  Administered 2020-07-18: 10 mL
  Administered 2020-07-19 (×2): 30 mL
  Administered 2020-07-20: 40 mL
  Administered 2020-07-20: 30 mL
  Administered 2020-07-21 (×2): 10 mL

## 2020-06-23 MED ORDER — MIDAZOLAM HCL 2 MG/2ML IJ SOLN
2.0000 mg | Freq: Once | INTRAMUSCULAR | Status: AC
  Administered 2020-06-23: 2 mg via INTRAVENOUS

## 2020-06-23 MED ORDER — FENTANYL 50 MCG/HR TD PT72
1.0000 | MEDICATED_PATCH | TRANSDERMAL | Status: DC
  Administered 2020-06-23 – 2020-08-01 (×14): 1 via TRANSDERMAL
  Filled 2020-06-23 (×18): qty 1

## 2020-06-23 MED ORDER — DIAZEPAM 5 MG PO TABS
5.0000 mg | ORAL_TABLET | Freq: Four times a day (QID) | ORAL | Status: DC | PRN
  Administered 2020-06-23 – 2020-08-02 (×62): 5 mg
  Filled 2020-06-23 (×6): qty 1
  Filled 2020-06-23 (×2): qty 3
  Filled 2020-06-23 (×6): qty 1
  Filled 2020-06-23: qty 3
  Filled 2020-06-23 (×20): qty 1
  Filled 2020-06-23: qty 3
  Filled 2020-06-23 (×8): qty 1
  Filled 2020-06-23: qty 3
  Filled 2020-06-23 (×3): qty 1
  Filled 2020-06-23: qty 3
  Filled 2020-06-23 (×16): qty 1

## 2020-06-23 NOTE — Progress Notes (Signed)
Patient back on vent due to desat per rn. Tolerated interventions well

## 2020-06-23 NOTE — Consult Note (Signed)
PHARMACY CONSULT NOTE  Pharmacy Consult for Electrolyte Monitoring and Replacement   Recent Labs: Potassium (mmol/L)  Date Value  06/23/2020 4.4   Magnesium (mg/dL)  Date Value  34/19/3790 2.4   Calcium (mg/dL)  Date Value  24/02/7352 8.2 (L)   Albumin (g/dL)  Date Value  29/92/4268 3.0 (L)   Phosphorus (mg/dL)  Date Value  34/19/6222 2.9   Sodium (mmol/L)  Date Value  06/23/2020 138   Assessment: 69 yo female admitted with acute on chronic hypoxic hypercapnic respiratory failure and acute encephalopathy secondary to narcotic use, AECOPD requiring mechanical intubation. Patient had PEG tube placed 12/29.  Nutrition: Tube feeds  Goal of Therapy:  Potassium 4.0 - 5.1 mmol/L Magnesium 2.0 - 2.4 mg/dL All Other Electrolytes within normal limits  Plan:   No electrolyte replacement warranted at this time  Next labs tomorrow AM  Tressie Ellis 06/23/2020 9:09 AM

## 2020-06-23 NOTE — Progress Notes (Signed)
Pt taken off vent and placed on Trach collar. Tolerating well at this time.

## 2020-06-23 NOTE — Progress Notes (Incomplete Revision)
   CRITICAL CARE PROGRESS NOTE    Name: Virginia [name] MRN: [id number] DOB: 02/16/1951     LOS: 17   SUBJECTIVE FINDINGS & SIGNIFICANT EVENTS    Patient description:  69 yo female with severe COPD admitted with acute on chronic hypoxic hypercapnic respiratory failure and acute encephalopathy secondary to narcotic use and AECOPD requiring mechanical intubation  Now s/p trach/PEG working on trach weaning.  On TC at present, anxious. Remains on precedex.  PHYSICAL EXAMINATION   Vital Signs: Temp:  [97.8 F (36.6 C)-100.4 F (38 C)] 99.1 F (37.3 C) (12/31 2000) Pulse Rate:  [26-114] 69 (12/31 2000) Resp:  [11-34] 26 (12/31 2000) BP: (82-186)/(48-133) 129/68 (12/31 2000) SpO2:  [91 %-100 %] 98 % (12/31 2000) FiO2 (%):  [28 %] 28 % (12/31 1402) Weight:  [62.4 kg] 62.4 kg (12/31 0325)  Constitutional: anxious frail woman lying in bed  Eyes: eomi, pupils equal Ears, nose, mouth, and throat: trach in place, minmal secretions Cardiovascular: RRR,  Ext warm Respiratory: diminished with prolonged expiratory phase, occasional accessory muscle use Gastrointestinal: soft, abd wrapped after PEG MSK: muscle wasting Skin: No rashes, normal turgor, scattered bruising Neurologic: moves all 4 ext to command, weak Psychiatric: anxious, answering questions appropriately   K/mg low- repleting Cr okay CBC benign   ASSESSMENT AND PLAN  Acute on chronic hypoxemic and hypercapnic respiratory failure thought secondary to polypharmacy with inability to wean from ventilator due to muscular deconditioning now s/p tracheostomy.  Dysphagia, moderate protein calorie malnutrition- now s/p PEG  Severe baseline COPD- complicated trach wean  Muscular deconditioning  Chronic anxiety/depression  Metabolic  encephalopathy/agitation- improving but remains on precedex gtt  - Wean precedex, use clonidine taper as bridge -Fentanyl patch - Switch IV steroids to PO steroids x 4 more days then stop - Switch from standing duonebs from duonebs to brovana/yupelri - PT/OT up to chair - Start standing clonazepam, use valium per tube for breakthrough, stop IV versed - SLP for PMV trials and ?PO - Work toward getting off IV meds so she can be candidate for rehab vs. SNF vs. LTACH   Patient critically ill due to respiratory failure, metabolic encephalopathy Interventions to address this today weaning precedex, weaning vent Risk of deterioration without these interventions is high  I personally spent 35 minutes providing critical care not including any separately billable procedures   C. Danice Goltz, MD [facility]   *This note was dictated using voice recognition software/Dragon.  Despite best efforts to proofread, errors can occur which can change the meaning.  Any change was purely unintentional.
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
## 2020-06-23 NOTE — Progress Notes (Signed)
Pt taken back off and placed on trach collar per patient, tolerating well at this time.

## 2020-06-23 NOTE — Progress Notes (Addendum)
CRITICAL CARE PROGRESS NOTE    Name: BRIELLAH BAIK MRN: [id number] DOB: Sep 09, 1950     LOS: 12   SUBJECTIVE FINDINGS & SIGNIFICANT EVENTS    Patient description:  69 yo female with severe COPD admitted with acute on chronic hypoxic hypercapnic respiratory failure and acute encephalopathy secondary to narcotic use and AECOPD requiring mechanical intubation  Now s/p trach/PEG working on trach weaning.  On TC at present, anxious. Remains on precedex.  PHYSICAL EXAMINATION   Vital Signs: Temp:  [97.8 F (36.6 C)-100.4 F (38 C)] 99.1 F (37.3 C) (12/31 2000) Pulse Rate:  [26-114] 69 (12/31 2000) Resp:  [11-34] 26 (12/31 2000) BP: (82-186)/(48-133) 129/68 (12/31 2000) SpO2:  [91 %-100 %] 98 % (12/31 2000) FiO2 (%):  [28 %] 28 % (12/31 1402) Weight:  [62.4 kg] 62.4 kg (12/31 0325)   Vent Mode: PRVC FiO2 (%):  [28 %-82 %] 40 % Set Rate:  [14 bmp] 14 bmp Vt Set:  [420 mL] 420 mL PEEP:  [5 cmH20] 5 cmH20 Pressure Support:  [10 cmH20] 10 cmH20 Plateau Pressure:  [17 cmH20] 17 cmH20   Constitutional: anxious frail woman lying in bed  Eyes: eomi, pupils equal Ears, nose, mouth, and throat: trach in place, minmal secretions Cardiovascular: RRR,  Ext warm Respiratory: diminished with prolonged expiratory phase, occasional accessory muscle use Gastrointestinal: soft, abd wrapped after PEG MSK: muscle wasting Skin: No rashes, normal turgor, scattered bruising Neurologic: moves all 4 ext to command, weak Psychiatric: anxious, answering questions appropriately   Scheduled Meds: . sodium chloride   Intravenous Once  . arformoterol  15 mcg Nebulization BID  . chlorhexidine gluconate (MEDLINE KIT)  15 mL Mouth Rinse BID  . Chlorhexidine Gluconate Cloth  6 each Topical Daily  . clonazePAM  1 mg Per  Tube BID  . cloNIDine  0.1 mg Per Tube TID   Followed by  . [START ON 06/27/2020] cloNIDine  0.1 mg Per Tube Daily  . feeding supplement (PROSource TF)  45 mL Per Tube Daily  . fentaNYL  1 patch Transdermal Q72H  . gabapentin  300 mg Per Tube TID  . insulin aspart  0-6 Units Subcutaneous Q4H  . mouth rinse  15 mL Mouth Rinse 10 times per day  . metoprolol tartrate  50 mg Per Tube BID  . pantoprazole (PROTONIX) IV  40 mg Intravenous QHS  . predniSONE  10 mg Per Tube Q breakfast  . QUEtiapine  25 mg Per Tube QHS  . revefenacin  175 mcg Nebulization Daily  . sodium chloride flush  10-40 mL Intracatheter Q12H  . sodium chloride flush  3 mL Intravenous Q12H   Continuous Infusions: . sodium chloride 10 mL/hr at 06/24/20 0400  . sodium chloride    . dexmedetomidine (PRECEDEX) IV infusion 1.2 mcg/kg/hr (06/24/20 1721)  . feeding supplement (JEVITY 1.2 CAL) 1,000 mL (06/24/20 0542)   PRN Meds:.sodium chloride, acetaminophen (TYLENOL) oral liquid 160 mg/5 mL, diazepam, ipratropium-albuterol, labetalol, metoprolol tartrate, ondansetron (ZOFRAN) IV, oxyCODONE, polyethylene glycol, sodium chloride flush, sodium chloride flush    ASSESSMENT AND PLAN  Acute on chronic hypoxemic and hypercapnic respiratory failure thought secondary to polypharmacy with inability to wean from ventilator due to muscular deconditioning now s/p tracheostomy.  Dysphagia, moderate protein calorie malnutrition- now s/p PEG  Severe baseline COPD- complicated trach wean  Muscular deconditioning  Chronic anxiety/depression  Metabolic encephalopathy/agitation- improving but remains on precedex gtt  - Weaned off precedex, use clonidine taper as bridge - Fentanyl patch - Switch  IV steroids to PO steroids x 4 more days then stop - Switch from standing duonebs from duonebs to brovana/yupelri - PT/OT up to chair - Continue standing clonazepam, use valium per tube for breakthrough - SLP for PMV trials - Work toward  getting off IV meds so she can be candidate for rehab vs. SNF vs. LTACH   Patient critically ill due to respiratory failure, metabolic encephalopathy Interventions to address this today weaning precedex, weaning vent Risk of deterioration without these interventions is high  I personally spent 35 minutes providing critical care not including any separately billable procedures  Multidisciplinary rounds were performed with the ICU team.  Patient would benefit from [facility].  Renold Don, MD [facility]   *This note was dictated using voice recognition software/Dragon.  Despite best efforts to proofread, errors can occur which can change the meaning.  Any change was purely unintentional.

## 2020-06-23 NOTE — Progress Notes (Signed)
Peripherally Inserted Central Catheter Placement  The IV Nurse has discussed with the patient and/or persons authorized to consent for the patient, the purpose of this procedure and the potential benefits and risks involved with this procedure.  The benefits include less needle sticks, lab draws from the catheter, and the patient may be discharged home with the catheter. Risks include, but not limited to, infection, bleeding, blood clot (thrombus formation), and puncture of an artery; nerve damage and irregular heartbeat and possibility to perform a PICC exchange if needed/ordered by physician.  Alternatives to this procedure were also discussed.  Bard Power PICC patient education guide, fact sheet on infection prevention and patient information card has been provided to patient /or left at bedside.    PICC Placement Documentation  PICC Triple Lumen 06/23/20 PICC Right Basilic 41 cm 0 cm (Active)  Exposed Catheter (cm) 0 cm 06/23/20 1512  Lumen #1 Status Blood return noted;Saline locked 06/23/20 1512  Lumen #2 Status Blood return noted;Saline locked 06/23/20 1512  Lumen #3 Status Blood return noted;Saline locked 06/23/20 1512  Dressing Type Transparent;Securing device 06/23/20 1512  Antimicrobial disc in place? Yes 06/23/20 1512  Safety Lock Not Applicable 06/23/20 1512  Dressing Change Due 06/30/20 06/23/20 1512       Romie Jumper 06/23/2020, 3:16 PM

## 2020-06-24 ENCOUNTER — Inpatient Hospital Stay: Payer: Self-pay

## 2020-06-24 DIAGNOSIS — J9622 Acute and chronic respiratory failure with hypercapnia: Secondary | ICD-10-CM | POA: Diagnosis not present

## 2020-06-24 DIAGNOSIS — J441 Chronic obstructive pulmonary disease with (acute) exacerbation: Secondary | ICD-10-CM | POA: Diagnosis not present

## 2020-06-24 DIAGNOSIS — J9621 Acute and chronic respiratory failure with hypoxia: Secondary | ICD-10-CM | POA: Diagnosis not present

## 2020-06-24 LAB — CBC WITH DIFFERENTIAL/PLATELET
Abs Immature Granulocytes: 0.07 10*3/uL (ref 0.00–0.07)
Basophils Absolute: 0 10*3/uL (ref 0.0–0.1)
Basophils Relative: 0 %
Eosinophils Absolute: 0.3 10*3/uL (ref 0.0–0.5)
Eosinophils Relative: 4 %
HCT: 24 % — ABNORMAL LOW (ref 36.0–46.0)
Hemoglobin: 7.6 g/dL — ABNORMAL LOW (ref 12.0–15.0)
Immature Granulocytes: 1 %
Lymphocytes Relative: 6 %
Lymphs Abs: 0.5 10*3/uL — ABNORMAL LOW (ref 0.7–4.0)
MCH: 30.4 pg (ref 26.0–34.0)
MCHC: 31.7 g/dL (ref 30.0–36.0)
MCV: 96 fL (ref 80.0–100.0)
Monocytes Absolute: 0.7 10*3/uL (ref 0.1–1.0)
Monocytes Relative: 7 %
Neutro Abs: 7.4 10*3/uL (ref 1.7–7.7)
Neutrophils Relative %: 82 %
Platelets: 145 10*3/uL — ABNORMAL LOW (ref 150–400)
RBC: 2.5 MIL/uL — ABNORMAL LOW (ref 3.87–5.11)
RDW: 15.5 % (ref 11.5–15.5)
WBC: 8.9 10*3/uL (ref 4.0–10.5)
nRBC: 0 % (ref 0.0–0.2)

## 2020-06-24 LAB — BASIC METABOLIC PANEL
Anion gap: 6 (ref 5–15)
BUN: 13 mg/dL (ref 8–23)
CO2: 34 mmol/L — ABNORMAL HIGH (ref 22–32)
Calcium: 8.6 mg/dL — ABNORMAL LOW (ref 8.9–10.3)
Chloride: 99 mmol/L (ref 98–111)
Creatinine, Ser: 0.59 mg/dL (ref 0.44–1.00)
GFR, Estimated: >60 mL/min (ref >60)
Glucose, Bld: 104 mg/dL — ABNORMAL HIGH (ref 70–99)
Potassium: 4.4 mmol/L (ref 3.5–5.1)
Sodium: 139 mmol/L (ref 135–145)

## 2020-06-24 LAB — MAGNESIUM: Magnesium: 2 mg/dL (ref 1.7–2.4)

## 2020-06-24 LAB — GLUCOSE, CAPILLARY
Glucose-Capillary: 106 mg/dL — ABNORMAL HIGH (ref 70–99)
Glucose-Capillary: 114 mg/dL — ABNORMAL HIGH (ref 70–99)
Glucose-Capillary: 114 mg/dL — ABNORMAL HIGH (ref 70–99)
Glucose-Capillary: 192 mg/dL — ABNORMAL HIGH (ref 70–99)
Glucose-Capillary: 87 mg/dL (ref 70–99)
Glucose-Capillary: 91 mg/dL (ref 70–99)

## 2020-06-24 LAB — PHOSPHORUS: Phosphorus: 3.6 mg/dL (ref 2.5–4.6)

## 2020-06-24 NOTE — Progress Notes (Signed)
RN requested pt be placed back on ventilator due to resp distress

## 2020-06-24 NOTE — Progress Notes (Signed)
Pt placed on Vent per MD and RN request for increase work of breathing, pt did not tolerate well, pt became very agitated pulling on vent and trach. Pt stated she did not want to be placed on ventilator again. RN and MD aware

## 2020-06-24 NOTE — Progress Notes (Signed)
CRITICAL CARE PROGRESS NOTE    Name: Virginia [name] MRN: [id number] DOB: 11-15-1950     LOS: 75   SUBJECTIVE FINDINGS & SIGNIFICANT EVENTS    Patient description:  70 yo female with severe COPD admitted with acute on chronic hypoxic hypercapnic respiratory failure and acute encephalopathy secondary to narcotic use and AECOPD requiring mechanical intubation  Now s/p trach/PEG working on trach weaning.  On TC at present, anxious. Remains on precedex.  PHYSICAL EXAMINATION   Vital Signs: Temp:  [98.2 F (36.8 C)-100.2 F (37.9 C)] 99.3 F (37.4 C) (01/01 1700) Pulse Rate:  [61-97] 61 (01/01 1800) Resp:  [11-43] 19 (01/01 1800) BP: (78-124)/(47-76) 116/71 (01/01 1800) SpO2:  [93 %-100 %] 100 % (01/01 2020) FiO2 (%):  [28 %-82 %] 40 % (01/01 2020) Weight:  [61.3 kg] 61.3 kg (01/01 0324)   Vent Mode: PRVC FiO2 (%):  [28 %-82 %] 40 % Set Rate:  [14 bmp] 14 bmp Vt Set:  [420 mL] 420 mL PEEP:  [5 cmH20] 5 cmH20 Pressure Support:  [10 cmH20] 10 cmH20 Plateau Pressure:  [17 cmH20] 17 cmH20   Constitutional: anxious frail woman lying in bed  Eyes: eomi, pupils equal Ears, nose, mouth, and throat: trach in place, minmal secretions Cardiovascular: RRR,  Ext warm Respiratory: diminished with prolonged expiratory phase, occasional accessory muscle use Gastrointestinal: soft, abd wrapped after PEG, some mild tenderness at PEG insertion site MSK: muscle wasting Skin: No rashes, normal turgor, scattered bruising Neurologic: moves all 4 ext to command, weak Psychiatric: anxious, answering questions appropriately   Scheduled Meds: . sodium chloride   Intravenous Once  . arformoterol  15 mcg Nebulization BID  . chlorhexidine gluconate (MEDLINE KIT)  15 mL Mouth Rinse BID  . Chlorhexidine Gluconate Cloth   6 each Topical Daily  . clonazePAM  1 mg Per Tube BID  . cloNIDine  0.1 mg Per Tube TID   Followed by  . [START ON 06/27/2020] cloNIDine  0.1 mg Per Tube Daily  . feeding supplement (PROSource TF)  45 mL Per Tube Daily  . fentaNYL  1 patch Transdermal Q72H  . gabapentin  300 mg Per Tube TID  . insulin aspart  0-6 Units Subcutaneous Q4H  . mouth rinse  15 mL Mouth Rinse 10 times per day  . metoprolol tartrate  50 mg Per Tube BID  . pantoprazole (PROTONIX) IV  40 mg Intravenous QHS  . predniSONE  10 mg Per Tube Q breakfast  . QUEtiapine  25 mg Per Tube QHS  . revefenacin  175 mcg Nebulization Daily  . sodium chloride flush  10-40 mL Intracatheter Q12H  . sodium chloride flush  3 mL Intravenous Q12H   Continuous Infusions: . sodium chloride 10 mL/hr at 06/24/20 0400  . sodium chloride    . dexmedetomidine (PRECEDEX) IV infusion 1.2 mcg/kg/hr (06/24/20 1721)  . feeding supplement (JEVITY 1.2 CAL) 1,000 mL (06/24/20 0542)   PRN Meds:.sodium chloride, acetaminophen (TYLENOL) oral liquid 160 mg/5 mL, diazepam, ipratropium-albuterol, labetalol, metoprolol tartrate, ondansetron (ZOFRAN) IV, oxyCODONE, polyethylene glycol, sodium chloride flush, sodium chloride flush    ASSESSMENT AND PLAN  Acute on chronic hypoxemic and hypercapnic respiratory failure thought secondary to polypharmacy with inability to wean from ventilator due to muscular deconditioning now s/p tracheostomy.  Dysphagia, moderate protein calorie malnutrition- now s/p PEG  Severe baseline COPD- complicated trach wean  Muscular deconditioning  Chronic anxiety/depression  Metabolic encephalopathy/agitation- improving but remains on precedex gtt  - Wean off precedex, use clonidine  taper as bridge -Continue fentanyl patch - Switch IV steroids to PO steroids x 4 more days then stop - Switched from standing duonebs from duonebs to brovana/yupelri - PT/OT up to chair - Continue standing clonazepam, use valium per tube for  breakthrough - SLP for PMV trials - Work toward getting off IV meds so she can be candidate for rehab vs. SNF vs. LTACH   Patient chronically critically ill due to respiratory failure, metabolic encephalopathy Interventions to address this today weaning precedex, weaning vent Risk of deterioration without these interventions is high  I personally spent 35 minutes providing critical care not including any separately billable procedures  Multidisciplinary rounds were performed with the ICU team.  Patient would benefit from [facility].  Renold Don, MD [place] [facility]   *This note was dictated using voice recognition software/Dragon.  Despite best efforts to proofread, errors can occur which can change the meaning.  Any change was purely unintentional.

## 2020-06-24 NOTE — Consult Note (Signed)
PHARMACY CONSULT NOTE  Pharmacy Consult for Electrolyte Monitoring and Replacement   Recent Labs: Potassium (mmol/L)  Date Value  06/24/2020 4.4   Magnesium (mg/dL)  Date Value  93/81/0175 2.0   Calcium (mg/dL)  Date Value  04/17/8526 8.6 (L)   Albumin (g/dL)  Date Value  78/24/2353 3.0 (L)   Phosphorus (mg/dL)  Date Value  61/44/3154 3.6   Sodium (mmol/L)  Date Value  06/24/2020 139   Assessment: 70 yo female admitted with acute on chronic hypoxic hypercapnic respiratory failure and acute encephalopathy secondary to narcotic use, AECOPD requiring mechanical intubation. Patient had PEG tube placed 12/29.  Nutrition: Tube feeds  Goal of Therapy:  Potassium 4.0 - 5.1 mmol/L Magnesium 2.0 - 2.4 mg/dL All Other Electrolytes within normal limits  Plan:   No electrolyte replacement warranted at this time  Next labs tomorrow AM  Albina Billet, PharmD, BCPS Clinical Pharmacist 06/24/2020 7:25 AM

## 2020-06-24 NOTE — Progress Notes (Addendum)
Patient beating bed with fist asking to be taken off vent and placed back on trach collar.  Patient attempting to take herself off vent Patient very upset.  Discussed with Dr. Jayme Cloud.  Per MD place patient back on trach collar and monitor closely.

## 2020-06-24 NOTE — Progress Notes (Addendum)
Patient with noted increased in respiratory rate and increased work of breathing. O2 SATs remain 95-100.  Patient on trach collar for several hours Dr. Jayme Cloud at bedside and asked that patient be placed on vent in pressure support mode for a few hours of rest.

## 2020-06-24 NOTE — Progress Notes (Signed)
Patient resting comfortably on vent after PRN dose of valium. Respiratory rate 22 and patient is no longer working to breath.

## 2020-06-24 NOTE — Progress Notes (Signed)
Patient called out on call bell and mouthed she could not breath.  Noted increased work of breathing and tachypnea.  02 SATs decreasing to 92%.  PRN valium to be given for anxiety and respiratory therapist to place patient back on vent.

## 2020-06-25 DIAGNOSIS — J9622 Acute and chronic respiratory failure with hypercapnia: Secondary | ICD-10-CM | POA: Diagnosis not present

## 2020-06-25 DIAGNOSIS — J441 Chronic obstructive pulmonary disease with (acute) exacerbation: Secondary | ICD-10-CM | POA: Diagnosis not present

## 2020-06-25 DIAGNOSIS — J9621 Acute and chronic respiratory failure with hypoxia: Secondary | ICD-10-CM | POA: Diagnosis not present

## 2020-06-25 LAB — GLUCOSE, CAPILLARY
Glucose-Capillary: 119 mg/dL — ABNORMAL HIGH (ref 70–99)
Glucose-Capillary: 134 mg/dL — ABNORMAL HIGH (ref 70–99)
Glucose-Capillary: 90 mg/dL (ref 70–99)
Glucose-Capillary: 112 mg/dL — ABNORMAL HIGH (ref 70–99)
Glucose-Capillary: 88 mg/dL (ref 70–99)

## 2020-06-25 LAB — CBC WITH DIFFERENTIAL/PLATELET
Abs Immature Granulocytes: 0.03 10*3/uL (ref 0.00–0.07)
Basophils Absolute: 0 10*3/uL (ref 0.0–0.1)
Basophils Relative: 0 %
Eosinophils Absolute: 0.3 10*3/uL (ref 0.0–0.5)
Eosinophils Relative: 5 %
HCT: 24.1 % — ABNORMAL LOW (ref 36.0–46.0)
Hemoglobin: 7.2 g/dL — ABNORMAL LOW (ref 12.0–15.0)
Immature Granulocytes: 0 %
Lymphocytes Relative: 10 %
Lymphs Abs: 0.8 10*3/uL (ref 0.7–4.0)
MCH: 29.8 pg (ref 26.0–34.0)
MCHC: 29.9 g/dL — ABNORMAL LOW (ref 30.0–36.0)
MCV: 99.6 fL (ref 80.0–100.0)
Monocytes Absolute: 0.6 10*3/uL (ref 0.1–1.0)
Monocytes Relative: 8 %
Neutro Abs: 5.8 10*3/uL (ref 1.7–7.7)
Neutrophils Relative %: 77 %
Platelets: 153 10*3/uL (ref 150–400)
RBC: 2.42 MIL/uL — ABNORMAL LOW (ref 3.87–5.11)
RDW: 15.4 % (ref 11.5–15.5)
WBC: 7.5 10*3/uL (ref 4.0–10.5)
nRBC: 0 % (ref 0.0–0.2)

## 2020-06-25 LAB — BASIC METABOLIC PANEL
Anion gap: 7 (ref 5–15)
BUN: 17 mg/dL (ref 8–23)
CO2: 33 mmol/L — ABNORMAL HIGH (ref 22–32)
Calcium: 8.2 mg/dL — ABNORMAL LOW (ref 8.9–10.3)
Chloride: 98 mmol/L (ref 98–111)
Creatinine, Ser: 0.55 mg/dL (ref 0.44–1.00)
GFR, Estimated: >60 mL/min (ref >60)
Glucose, Bld: 126 mg/dL — ABNORMAL HIGH (ref 70–99)
Potassium: 4.3 mmol/L (ref 3.5–5.1)
Sodium: 138 mmol/L (ref 135–145)

## 2020-06-25 LAB — PHOSPHORUS: Phosphorus: 4.2 mg/dL (ref 2.5–4.6)

## 2020-06-25 LAB — MAGNESIUM: Magnesium: 1.8 mg/dL (ref 1.7–2.4)

## 2020-06-25 MED ORDER — MAGNESIUM SULFATE 2 GM/50ML IV SOLN
2.0000 g | Freq: Once | INTRAVENOUS | Status: AC
  Administered 2020-06-25: 2 g via INTRAVENOUS
  Filled 2020-06-25: qty 50

## 2020-06-25 NOTE — Progress Notes (Signed)
CRITICAL CARE PROGRESS NOTE    Name: Virginia [name] MRN: [id number] DOB: 1951/06/09     LOS: 39   SUBJECTIVE FINDINGS & SIGNIFICANT EVENTS    Patient description:  70 yo female with severe COPD admitted with acute on chronic hypoxic hypercapnic respiratory failure and acute encephalopathy secondary to narcotic use and AECOPD requiring mechanical intubation  Now s/p trach/PEG working on trach weaning.  Back on vent, anxious. Weaning off of precedex.  PHYSICAL EXAMINATION   Vital Signs: Temp:  [97.6 F (36.4 C)-98.5 F (36.9 C)] 98.5 F (36.9 C) (01/02 1600) Pulse Rate:  [25-88] 88 (01/02 2100) Resp:  [11-27] 19 (01/02 2100) BP: (88-150)/(51-77) 138/65 (01/02 2100) SpO2:  [66 %-100 %] 100 % (01/02 2100) FiO2 (%):  [28 %-40 %] 35 % (01/02 1938) Weight:  [62 kg] 62 kg (01/02 0500)   Vent Mode: PRVC FiO2 (%):  [28 %-40 %] 35 % Set Rate:  [14 bmp] 14 bmp Vt Set:  [420 mL] 420 mL PEEP:  [5 cmH20] 5 cmH20 Plateau Pressure:  [26 cmH20-32 cmH20] 26 cmH20   Constitutional: anxious frail woman lying in bed  Eyes: eomi, pupils equal Ears, nose, mouth, and throat: trach in place, minmal secretions Cardiovascular: RRR,  Ext warm Respiratory: diminished with prolonged expiratory phase, occasional accessory muscle use Gastrointestinal: soft, abd wrapped after PEG, some mild tenderness at PEG insertion site MSK: muscle wasting Skin: No rashes, normal turgor, scattered bruising Neurologic: moves all 4 ext to command, weak Psychiatric: anxious, answering questions appropriately   Scheduled Meds:  sodium chloride   Intravenous Once   arformoterol  15 mcg Nebulization BID   chlorhexidine gluconate (MEDLINE KIT)  15 mL Mouth Rinse BID   Chlorhexidine Gluconate Cloth  6 each Topical Daily    clonazePAM  1 mg Per Tube BID   cloNIDine  0.1 mg Per Tube TID   Followed by   Derrill Memo ON 06/27/2020] cloNIDine  0.1 mg Per Tube Daily   feeding supplement (PROSource TF)  45 mL Per Tube Daily   fentaNYL  1 patch Transdermal Q72H   gabapentin  300 mg Per Tube TID   insulin aspart  0-6 Units Subcutaneous Q4H   mouth rinse  15 mL Mouth Rinse 10 times per day   metoprolol tartrate  50 mg Per Tube BID   pantoprazole (PROTONIX) IV  40 mg Intravenous QHS   predniSONE  10 mg Per Tube Q breakfast   QUEtiapine  25 mg Per Tube QHS   revefenacin  175 mcg Nebulization Daily   sodium chloride flush  10-40 mL Intracatheter Q12H   sodium chloride flush  3 mL Intravenous Q12H   Continuous Infusions:  sodium chloride 10 mL/hr at 06/25/20 1600   sodium chloride     dexmedetomidine (PRECEDEX) IV infusion Stopped (06/25/20 1114)   feeding supplement (JEVITY 1.2 CAL) 1,000 mL (06/25/20 1305)   PRN Meds:.sodium chloride, acetaminophen (TYLENOL) oral liquid 160 mg/5 mL, diazepam, ipratropium-albuterol, labetalol, metoprolol tartrate, ondansetron (ZOFRAN) IV, oxyCODONE, polyethylene glycol, sodium chloride flush, sodium chloride flush    ASSESSMENT AND PLAN  Acute on chronic hypoxemic and hypercapnic respiratory failure thought secondary to polypharmacy with inability to wean from ventilator due to muscular deconditioning now s/p tracheostomy.  Dysphagia, moderate protein calorie malnutrition- now s/p PEG  Severe baseline COPD- complicated trach wean  Muscular deconditioning  Chronic anxiety/depression  Metabolic encephalopathy/agitation- improving but remains on precedex gtt  - Wean off precedex, use clonidine taper as bridge, reiterated weaning off of  Precedex - Continue fentanyl patch - Switch IV steroids to PO steroids x 4 more days then stop - Switched from standing duonebs from duonebs to brovana/yupelri on 12/30 if not effective resume DuoNeb 4 times daily with budesonide  twice daily - PT/OT up to chair - Continue standing clonazepam, use valium per tube for breakthrough - SLP for PMV trials - Work toward getting off IV meds so she can be candidate for rehab vs. SNF vs. LTACH   Patient chronically critically ill due to respiratory failure, high levels of anxiety Interventions to address this today weaning precedex, weaning vent Risk of deterioration without these interventions is high  I personally spent 35 minutes providing critical care not including any separately billable procedures  Patient would benefit from [facility]  Renold Don, MD [place] [facility]   *This note was dictated using voice recognition software/Dragon.  Despite best efforts to proofread, errors can occur which can change the meaning.  Any change was purely unintentional.

## 2020-06-25 NOTE — Consult Note (Signed)
PHARMACY CONSULT NOTE  Pharmacy Consult for Electrolyte Monitoring and Replacement   Recent Labs: Potassium (mmol/L)  Date Value  06/25/2020 4.3   Magnesium (mg/dL)  Date Value  87/86/7672 1.8   Calcium (mg/dL)  Date Value  09/47/0962 8.2 (L)   Albumin (g/dL)  Date Value  83/66/2947 3.0 (L)   Phosphorus (mg/dL)  Date Value  65/46/5035 4.2   Sodium (mmol/L)  Date Value  06/25/2020 138   Assessment: 70 yo female admitted with acute on chronic hypoxic hypercapnic respiratory failure and acute encephalopathy secondary to narcotic use, AECOPD requiring mechanical intubation. Patient had PEG tube placed 12/29.  Nutrition: Tube feeds  Goal of Therapy:  Potassium 4.0 - 5.1 mmol/L Magnesium 2.0 - 2.4 mg/dL All Other Electrolytes within normal limits  Plan:   Mg 1.8 - repleted with 2g IV bolus  Next labs tomorrow AM  Albina Billet, PharmD, BCPS Clinical Pharmacist 06/25/2020 9:55 AM

## 2020-06-25 NOTE — Progress Notes (Signed)
Remained calm tonight, no agitation or violence towards staff. Appears more oriented.

## 2020-06-26 LAB — BASIC METABOLIC PANEL
Anion gap: 6 (ref 5–15)
BUN: 15 mg/dL (ref 8–23)
CO2: 37 mmol/L — ABNORMAL HIGH (ref 22–32)
Calcium: 8.3 mg/dL — ABNORMAL LOW (ref 8.9–10.3)
Chloride: 95 mmol/L — ABNORMAL LOW (ref 98–111)
Creatinine, Ser: 0.59 mg/dL (ref 0.44–1.00)
GFR, Estimated: >60 mL/min (ref >60)
Glucose, Bld: 115 mg/dL — ABNORMAL HIGH (ref 70–99)
Potassium: 4.1 mmol/L (ref 3.5–5.1)
Sodium: 138 mmol/L (ref 135–145)

## 2020-06-26 LAB — CBC WITH DIFFERENTIAL/PLATELET
Abs Immature Granulocytes: 0.06 10*3/uL (ref 0.00–0.07)
Basophils Absolute: 0 10*3/uL (ref 0.0–0.1)
Basophils Relative: 0 %
Eosinophils Absolute: 0.4 10*3/uL (ref 0.0–0.5)
Eosinophils Relative: 5 %
HCT: 23.8 % — ABNORMAL LOW (ref 36.0–46.0)
Hemoglobin: 7.2 g/dL — ABNORMAL LOW (ref 12.0–15.0)
Immature Granulocytes: 1 %
Lymphocytes Relative: 14 %
Lymphs Abs: 1 10*3/uL (ref 0.7–4.0)
MCH: 29.9 pg (ref 26.0–34.0)
MCHC: 30.3 g/dL (ref 30.0–36.0)
MCV: 98.8 fL (ref 80.0–100.0)
Monocytes Absolute: 0.6 10*3/uL (ref 0.1–1.0)
Monocytes Relative: 8 %
Neutro Abs: 5.5 10*3/uL (ref 1.7–7.7)
Neutrophils Relative %: 72 %
Platelets: 201 10*3/uL (ref 150–400)
RBC: 2.41 MIL/uL — ABNORMAL LOW (ref 3.87–5.11)
RDW: 15 % (ref 11.5–15.5)
WBC: 7.6 10*3/uL (ref 4.0–10.5)
nRBC: 0 % (ref 0.0–0.2)

## 2020-06-26 LAB — GLUCOSE, CAPILLARY
Glucose-Capillary: 111 mg/dL — ABNORMAL HIGH (ref 70–99)
Glucose-Capillary: 118 mg/dL — ABNORMAL HIGH (ref 70–99)
Glucose-Capillary: 121 mg/dL — ABNORMAL HIGH (ref 70–99)
Glucose-Capillary: 126 mg/dL — ABNORMAL HIGH (ref 70–99)
Glucose-Capillary: 146 mg/dL — ABNORMAL HIGH (ref 70–99)
Glucose-Capillary: 94 mg/dL (ref 70–99)
Glucose-Capillary: 95 mg/dL (ref 70–99)

## 2020-06-26 LAB — PHOSPHORUS: Phosphorus: 4.5 mg/dL (ref 2.5–4.6)

## 2020-06-26 LAB — MAGNESIUM: Magnesium: 1.8 mg/dL (ref 1.7–2.4)

## 2020-06-26 MED ORDER — APIXABAN 5 MG PO TABS
5.0000 mg | ORAL_TABLET | Freq: Two times a day (BID) | ORAL | Status: DC
  Administered 2020-06-26: 5 mg via ORAL
  Filled 2020-06-26: qty 1

## 2020-06-26 MED ORDER — APIXABAN 5 MG PO TABS: 5.0000 mg | ORAL_TABLET | Freq: Two times a day (BID) | ORAL | Status: DC

## 2020-06-26 MED ORDER — MAGNESIUM SULFATE 2 GM/50ML IV SOLN
2.0000 g | Freq: Once | INTRAVENOUS | Status: AC
  Administered 2020-06-26: 2 g via INTRAVENOUS
  Filled 2020-06-26: qty 50

## 2020-06-26 NOTE — Progress Notes (Signed)
Placed on Trach collar, tolerating well at this time. RN notified.

## 2020-06-26 NOTE — Progress Notes (Signed)
Pt switched to vent @ outset of shift. Foley and rectal tube remain in place, draining adequately. Pt increasingly anxious @ outset of shift, PRN valium given. Pt calm and relaxed @ this time, following commands + cooperative. Turn q2. Pt in no acute distress @ this time. Will continue to monitor.

## 2020-06-26 NOTE — Progress Notes (Addendum)
Shift note: pt alert and responsive this shift, able to communicate most needs by mouthing words. 02 sat's continuously monitored. Sat's well maintained at rest,desats and c/o dyspnea with activity and when [facility] lowered for daily and prn care. Remains on continuous tube feeding, well tolerated. 02 at 6 LPM via trach collar. NSR this shift. Rectal tube in place for loose stools, patent. Pericare provided prn leakage. Foley cath intact and patent, with clear amber urine draining. Patient, pleasant ant cooperative, no complaints voiced this shift.

## 2020-06-26 NOTE — Progress Notes (Signed)
Nutrition Follow-up  DOCUMENTATION CODES:   Not applicable  INTERVENTION:   Continue Jevity 1.2 Cal at 60 mL/hr (1440 ml goal daily volume) + PROSource TF 45 mL once daily per tube. Provides 1768 kcal, 91 grams of protein, 26 grams of fiber, 1166 mL H2O daily.  Recommend free water flush of 90 mL Q4hrs. Provides a total of 1706 mL H2O daily including water in TF regimen.  NUTRITION DIAGNOSIS:   Increased nutrient needs related to catabolic illness (COPD) as evidenced by estimated needs. Ongoing.  GOAL:   Patient will meet greater than or equal to 90% of their needs Met with TF regimen.  MONITOR:   PO intake,Supplement acceptance,Labs,Weight trends,I & O's  ASSESSMENT:   69 year old female with PMHx of COPD, hx tracheostomy tube placement and PEG tube placement in 2017, HTN, depression, RLS, osteoporosis, hepatitis C, A-fib, anxiety, arthritis admitted with acute on chronic hypoxic hypercapnic respiratory failure and acute encephalopathy secondary to narcotic use and acute exacerbation of COPD.   Pt s/p PEG tube 12/29 Pt s/p PICC line 12/31  Pt placed back on ventilator 1/1 secondary to respiratory distress. Pt placed back on trach this morning; pt tolerating well. Pt tolerating tube feeds at goal rate via PEG tube. Refeed labs stable. Per chart, pt is weight stable since admit.   Medications reviewed and include: insulin, protonix, prednisone  Labs reviewed: K 4.1 wnl, P 4.5 wnl, Mg 1.8 wnl Hgb 7.2(L), Hct 23.8(L) cbgs- 118, 111, 121 x 24 hrs  MAP- >65mmHg  UOP- 3025ml  Diet Order:   Diet Order            Diet NPO time specified  Diet effective now                EDUCATION NEEDS:   No education needs have been identified at this time  Skin:  Skin Assessment: Skin Integrity Issues: Skin Integrity Issues:: Incisions Incisions: closed incision to neck  Last BM:  1/3- TYPE 7  Height:   Ht Readings from Last 1 Encounters:  06/06/20 5' 2.01" (1.575 m)    Weight:   Wt Readings from Last 1 Encounters:  06/25/20 62 kg   Ideal Body Weight:  50 kg  BMI:  Body mass index is 24.99 kg/m.  Estimated Nutritional Needs:   Kcal:  1500-1750  Protein:  85-95 grams  Fluid:  1.5-1.7 L/day    MS, RD, LDN Please refer to AMION for RD and/or RD on-call/weekend/after hours pager  

## 2020-06-26 NOTE — Consult Note (Signed)
PHARMACY CONSULT NOTE  Pharmacy Consult for Electrolyte Monitoring and Replacement   Recent Labs: Potassium (mmol/L)  Date Value  06/26/2020 4.1   Magnesium (mg/dL)  Date Value  67/89/3810 1.8   Calcium (mg/dL)  Date Value  17/51/0258 8.3 (L)   Albumin (g/dL)  Date Value  52/77/8242 3.0 (L)   Phosphorus (mg/dL)  Date Value  35/36/1443 4.5   Sodium (mmol/L)  Date Value  06/26/2020 138   Corrected Ca: 9.1 mg/dL  Assessment: 70 yo female admitted with acute on chronic hypoxic hypercapnic respiratory failure and acute encephalopathy secondary to narcotic use, AECOPD requiring mechanical intubation. Patient had PEG tube placed 12/29. Pt placed back on trach this morning; pt tolerating well. Pt tolerating tube feeds at goal rate via PEG tube. Refeed labs stable.   Nutrition: Tube feeds  Goal of Therapy:  Potassium 4.0 - 5.1 mmol/L Magnesium 2.0 - 2.4 mg/dL All Other Electrolytes within normal limits  Plan:   2 grams IV magnesium sulfate x 1  Next labs tomorrow AM  Lowella Bandy, PharmD, BCPS Clinical Pharmacist 06/26/2020 9:15 AM

## 2020-06-26 NOTE — Progress Notes (Signed)
CRITICAL CARE PROGRESS NOTE    Name: Virginia [name] MRN: [id number] DOB: [birth year]     LOS: 47   SUBJECTIVE FINDINGS & SIGNIFICANT EVENTS    Patient description:  70 yo female with severe COPD admitted with acute on chronic hypoxic hypercapnic respiratory failure and acute encephalopathy secondary to narcotic use and AECOPD requiring mechanical intubation  Now s/p trach/PEG working on trach weaning.  On TC at present, anxious. Remains on precedex.  PHYSICAL EXAMINATION   Vital Signs: Temp:  [97.8 F (36.6 C)-100 F (37.8 C)] 100 F (37.8 C) (01/03 0852) Pulse Rate:  [25-88] 83 (01/03 0500) Resp:  [11-27] 11 (01/03 0700) BP: (96-150)/(55-77) 102/61 (01/03 0700) SpO2:  [66 %-100 %] 96 % (01/03 0500) FiO2 (%):  [35 %] 35 % (01/03 0216)   Vent Mode: PRVC FiO2 (%):  [35 %] 35 % Set Rate:  [14 bmp] 14 bmp Vt Set:  [42 mL-420 mL] 42 mL PEEP:  [5 cmH20] 5 cmH20 Plateau Pressure:  [23 cmH20-26 cmH20] 23 cmH20   Constitutional: anxious frail woman lying in bed  Eyes: eomi, pupils equal Ears, nose, mouth, and throat: trach in place, minmal secretions Cardiovascular: RRR,  Ext warm Respiratory: diminished with prolonged expiratory phase, occasional accessory muscle use Gastrointestinal: soft, abd wrapped after PEG, some mild tenderness at PEG insertion site MSK: muscle wasting Skin: No rashes, normal turgor, scattered bruising Neurologic: moves all 4 ext to command, weak Psychiatric: anxious, answering questions appropriately   Scheduled Meds: . sodium chloride   Intravenous Once  . arformoterol  15 mcg Nebulization BID  . chlorhexidine gluconate (MEDLINE KIT)  15 mL Mouth Rinse BID  . Chlorhexidine Gluconate Cloth  6 each Topical Daily  . clonazePAM  1 mg Per Tube BID  . cloNIDine  0.1 mg  Per Tube TID   Followed by  . [START ON 06/27/2020] cloNIDine  0.1 mg Per Tube Daily  . feeding supplement (PROSource TF)  45 mL Per Tube Daily  . fentaNYL  1 patch Transdermal Q72H  . gabapentin  300 mg Per Tube TID  . insulin aspart  0-6 Units Subcutaneous Q4H  . mouth rinse  15 mL Mouth Rinse 10 times per day  . metoprolol tartrate  50 mg Per Tube BID  . pantoprazole (PROTONIX) IV  40 mg Intravenous QHS  . predniSONE  10 mg Per Tube Q breakfast  . QUEtiapine  25 mg Per Tube QHS  . revefenacin  175 mcg Nebulization Daily  . sodium chloride flush  10-40 mL Intracatheter Q12H  . sodium chloride flush  3 mL Intravenous Q12H   Continuous Infusions: . sodium chloride 10 mL/hr at 06/25/20 1600  . sodium chloride    . dexmedetomidine (PRECEDEX) IV infusion Stopped (06/25/20 1114)  . feeding supplement (JEVITY 1.2 CAL) 1,000 mL (06/25/20 1305)   PRN Meds:.sodium chloride, acetaminophen (TYLENOL) oral liquid 160 mg/5 mL, diazepam, ipratropium-albuterol, labetalol, metoprolol tartrate, ondansetron (ZOFRAN) IV, oxyCODONE, polyethylene glycol, sodium chloride flush, sodium chloride flush    ASSESSMENT AND PLAN  Acute on chronic hypoxemic and hypercapnic respiratory failure thought secondary to polypharmacy with inability to wean from ventilator due to muscular deconditioning now s/p tracheostomy.  Dysphagia, moderate protein calorie malnutrition- now s/p PEG  Severe baseline COPD- complicated trach wean  Muscular deconditioning  Chronic anxiety/depression  Metabolic encephalopathy/agitation- improving but remains on precedex gtt  - Wean off precedex, use clonidine taper as bridge -Continue fentanyl patch - Switch IV steroids to PO steroids x 4 more  days then stop - Switched from standing duonebs from duonebs to brovana/yupelri - PT/OT up to chair - Continue standing clonazepam, use valium per tube for breakthrough - SLP for PMV trials - Work toward getting off IV meds so she can be  candidate for rehab vs. SNF vs. LTACH   Patient chronically critically ill due to respiratory failure, metabolic encephalopathy Interventions to address this today weaning precedex, weaning vent Risk of deterioration without these interventions is high   Multidisciplinary rounds were performed with the ICU team.  Patient would benefit from [facility].  Critical care provider statement:    Critical care time (minutes):  33   Critical care time was exclusive of:  Separately billable procedures and  treating other patients   Critical care was necessary to treat or prevent imminent or  life-threatening deterioration of the following conditions:  acute on chronichypoxemic respiartory failure, trache status, peg status, advanced copd   Critical care was time spent personally by me on the following  activities:  Development of treatment plan with patient or surrogate,  discussions with consultants, evaluation of patient's response to  treatment, examination of patient, obtaining history from patient or  surrogate, ordering and performing treatments and interventions, ordering  and review of laboratory studies and re-evaluation of patient's condition   I assumed direction of critical care for this patient from another  provider in my specialty: no    *This note was dictated using voice recognition software/Dragon.  Despite best efforts to proofread, errors can occur which can change the meaning.  Any change was purely unintentional.     Ottie Glazier, M.D.  Pulmonary & [place]

## 2020-06-26 NOTE — Progress Notes (Signed)
Physical Therapy Treatment Patient Details Name: Virginia [name] MRN: [id number] DOB: 26-Feb-1951 Today's Date: 06/26/2020    History of Present Illness 70 yo female with severe COPD admitted with acute on chronic hypoxic hypercapnic respiratory failure and acute encephalopathy secondary to narcotic use and AECOPD requiring mechanical intubation. Initially intubated from 12/14-12/16 and then again from 12/18-12/23. Now with + trach collar. Recent hospitalization from 12/10-12/12/21.    PT Comments    Pt is making gradual progress towards goals with ability to sit at EOB with +2 assist and progressed to +1 assist. Unable to tolerate transfers at this time. O2 sats decreased while on 6L of O2 via trach collar, however quickly improve to ~100% at rest. Good endurance with there-ex. Will continue to progress as able. Appears motivated and wants to go home ASAP.   Follow Up Recommendations  SNF     Equipment Recommendations       Recommendations for Other Services       Precautions / Restrictions Precautions Precautions: Fall Restrictions Weight Bearing Restrictions: No    Mobility  Bed Mobility Overal bed mobility: Needs Assistance Bed Mobility: Supine to Sit;Sit to Supine     Supine to sit: Mod assist;+2 for physical assistance Sit to supine: Mod assist;+2 for physical assistance   General bed mobility comments: Pt able to sit at EOB progressing to cga of 1 person. Able to tolerate for about 5 min prior to O2 sats dropping to 80% while on 6L of trach collar. Pt fatigues quickly and assisted back supine. Needs assist for repositioning.  Transfers                 General transfer comment: unable to tolerate transfer attempts at this time  Ambulation/Gait                 Stairs             Wheelchair Mobility    Modified Rankin (Stroke Patients Only)       Balance Overall balance assessment: Needs assistance Sitting-balance support: Single  extremity supported;Feet supported Sitting balance-Leahy Scale: Fair                                      Cognition Arousal/Alertness: Awake/alert Behavior During Therapy: WFL for tasks assessed/performed Overall Cognitive Status: Within Functional Limits for tasks assessed                                        Exercises Other Exercises Other Exercises: supine ther-ex performed on B LE including AP, quad sets, SLRs, and hip abd/add. All ther-ex performed x 10 reps with min assist. Educated on frequency and duration.    General Comments        Pertinent Vitals/Pain Pain Assessment: No/denies pain    Home Living                      Prior Function            PT Goals (current goals can now be found in the care plan section) Acute Rehab PT Goals Patient Stated Goal: To return home safely PT Goal Formulation: With patient Time For Goal Achievement: 07/06/20 Potential to Achieve Goals: Good Progress towards PT goals: Progressing toward goals    Frequency    Min  2X/week      PT Plan Current plan remains appropriate    Co-evaluation              AM-PAC PT "6 Clicks" Mobility   Outcome Measure  Help needed turning from your back to your side while in a flat bed without using bedrails?: A Lot Help needed moving from lying on your back to sitting on the side of a flat bed without using bedrails?: A Lot Help needed moving to and from a bed to a chair (including a wheelchair)?: Total Help needed standing up from a chair using your arms (e.g., wheelchair or bedside chair)?: Total Help needed to walk in hospital room?: Total Help needed climbing 3-5 steps with a railing? : Total 6 Click Score: 8    End of Session Equipment Utilized During Treatment: Oxygen Activity Tolerance: Patient tolerated treatment well Patient left: in bed Nurse Communication: Mobility status PT Visit Diagnosis: Muscle weakness (generalized)  (M62.81);Difficulty in walking, not elsewhere classified (R26.2)     Time: 1008-1040 PT Time Calculation (min) (ACUTE ONLY): 32 min  Charges:  $Therapeutic Exercise: 8-22 mins $Therapeutic Activity: 8-22 mins                     Elizabeth Palau, PT, DPT [phone]    Cathryn Gallery 06/26/2020, 4:09 PM

## 2020-06-27 LAB — BASIC METABOLIC PANEL
Anion gap: 7 (ref 5–15)
BUN: 15 mg/dL (ref 8–23)
CO2: 41 mmol/L — ABNORMAL HIGH (ref 22–32)
Calcium: 8.9 mg/dL (ref 8.9–10.3)
Chloride: 93 mmol/L — ABNORMAL LOW (ref 98–111)
Creatinine, Ser: 0.57 mg/dL (ref 0.44–1.00)
GFR, Estimated: >60 mL/min (ref >60)
Glucose, Bld: 102 mg/dL — ABNORMAL HIGH (ref 70–99)
Potassium: 4 mmol/L (ref 3.5–5.1)
Sodium: 141 mmol/L (ref 135–145)

## 2020-06-27 LAB — CBC WITH DIFFERENTIAL/PLATELET
Abs Immature Granulocytes: 0.08 10*3/uL — ABNORMAL HIGH (ref 0.00–0.07)
Basophils Absolute: 0 10*3/uL (ref 0.0–0.1)
Basophils Relative: 0 %
Eosinophils Absolute: 0.4 10*3/uL (ref 0.0–0.5)
Eosinophils Relative: 5 %
HCT: 26 % — ABNORMAL LOW (ref 36.0–46.0)
Hemoglobin: 7.8 g/dL — ABNORMAL LOW (ref 12.0–15.0)
Immature Granulocytes: 1 %
Lymphocytes Relative: 16 %
Lymphs Abs: 1 10*3/uL (ref 0.7–4.0)
MCH: 29.5 pg (ref 26.0–34.0)
MCHC: 30 g/dL (ref 30.0–36.0)
MCV: 98.5 fL (ref 80.0–100.0)
Monocytes Absolute: 0.6 10*3/uL (ref 0.1–1.0)
Monocytes Relative: 9 %
Neutro Abs: 4.6 10*3/uL (ref 1.7–7.7)
Neutrophils Relative %: 69 %
Platelets: 241 10*3/uL (ref 150–400)
RBC: 2.64 MIL/uL — ABNORMAL LOW (ref 3.87–5.11)
RDW: 14.9 % (ref 11.5–15.5)
WBC: 6.7 10*3/uL (ref 4.0–10.5)
nRBC: 0 % (ref 0.0–0.2)

## 2020-06-27 LAB — MAGNESIUM: Magnesium: 1.9 mg/dL (ref 1.7–2.4)

## 2020-06-27 LAB — PHOSPHORUS: Phosphorus: 5.6 mg/dL — ABNORMAL HIGH (ref 2.5–4.6)

## 2020-06-27 LAB — GLUCOSE, CAPILLARY
Glucose-Capillary: 93 mg/dL (ref 70–99)
Glucose-Capillary: 100 mg/dL — ABNORMAL HIGH (ref 70–99)
Glucose-Capillary: 146 mg/dL — ABNORMAL HIGH (ref 70–99)
Glucose-Capillary: 137 mg/dL — ABNORMAL HIGH (ref 70–99)
Glucose-Capillary: 110 mg/dL — ABNORMAL HIGH (ref 70–99)
Glucose-Capillary: 96 mg/dL (ref 70–99)

## 2020-06-27 NOTE — Progress Notes (Signed)
70 y/o F w/ PMH of severe COPD who is s/p trach w/ trach collar & PEG. Pt has been stable. TOC is working on placement to possible SNF. Hospitalist service will take over care tomorrow.  This is non-billable note.

## 2020-06-27 NOTE — Progress Notes (Signed)
CRITICAL CARE PROGRESS NOTE    Name: Virginia [name] MRN: [id number] DOB: [birth year]     LOS: 81   SUBJECTIVE FINDINGS & SIGNIFICANT EVENTS    Patient description:  70 yo female with severe COPD admitted with acute on chronic hypoxic hypercapnic respiratory failure and acute encephalopathy secondary to narcotic use and AECOPD requiring mechanical intubation  Now s/p trach/PEG working on trach weaning.  On TC at present, anxious. Remains on precedex.  06/28/19- patient is improved.  Signed out to [facility] - Dr Jimmye Norman for pick up in am.   PHYSICAL EXAMINATION   Vital Signs: Temp:  [99.2 F (37.3 C)-99.7 F (37.6 C)] 99.2 F (37.3 C) (01/04 1200) Pulse Rate:  [25-94] 44 (01/04 1500) Resp:  [19-28] 19 (01/04 1500) BP: (129-186)/(69-128) 155/88 (01/04 1500) SpO2:  [84 %-100 %] 92 % (01/04 1500) FiO2 (%):  [35 %] 35 % (01/04 1400) Weight:  [57.8 kg] 57.8 kg (01/04 0500)   FiO2 (%):  [35 %] 35 %   Constitutional: anxious frail woman lying in bed  Eyes: eomi, pupils equal Ears, nose, mouth, and throat: trach in place, minmal secretions Cardiovascular: RRR,  Ext warm Respiratory: diminished with prolonged expiratory phase, occasional accessory muscle use Gastrointestinal: soft, abd wrapped after PEG, some mild tenderness at PEG insertion site MSK: muscle wasting Skin: No rashes, normal turgor, scattered bruising Neurologic: moves all 4 ext to command, weak Psychiatric: anxious, answering questions appropriately   Scheduled Meds: . sodium chloride   Intravenous Once  . arformoterol  15 mcg Nebulization BID  . chlorhexidine gluconate (MEDLINE KIT)  15 mL Mouth Rinse BID  . Chlorhexidine Gluconate Cloth  6 each Topical Daily  . clonazePAM  1 mg Per Tube BID  . cloNIDine  0.1 mg Per Tube Daily  .  feeding supplement (PROSource TF)  45 mL Per Tube Daily  . fentaNYL  1 patch Transdermal Q72H  . gabapentin  300 mg Per Tube TID  . insulin aspart  0-6 Units Subcutaneous Q4H  . mouth rinse  15 mL Mouth Rinse 10 times per day  . metoprolol tartrate  50 mg Per Tube BID  . pantoprazole (PROTONIX) IV  40 mg Intravenous QHS  . QUEtiapine  25 mg Per Tube QHS  . sodium chloride flush  10-40 mL Intracatheter Q12H  . sodium chloride flush  3 mL Intravenous Q12H   Continuous Infusions: . sodium chloride Stopped (06/25/20 1842)  . sodium chloride    . feeding supplement (JEVITY 1.2 CAL) 1,000 mL (06/25/20 1305)   PRN Meds:.sodium chloride, acetaminophen (TYLENOL) oral liquid 160 mg/5 mL, diazepam, ipratropium-albuterol, labetalol, metoprolol tartrate, ondansetron (ZOFRAN) IV, oxyCODONE, polyethylene glycol, sodium chloride flush, sodium chloride flush    ASSESSMENT AND PLAN   Acute on chronic hypoxemic and hypercapnic respiratory failure thought secondary to polypharmacy with inability to wean from ventilator due to muscular deconditioning now s/p tracheostomy.  Dysphagia, moderate protein calorie malnutrition- now s/p PEG  Severe baseline COPD- complicated trach wean  Muscular deconditioning  Chronic anxiety/depression  Metabolic encephalopathy/agitation- improving but remains on precedex gtt  - Wean off precedex, use clonidine taper as bridge -Continue fentanyl patch - Switch IV steroids to PO steroids x 4 more days then stop - Switched from standing duonebs from duonebs to brovana/yupelri - PT/OT up to chair - Continue standing clonazepam, use valium per tube for breakthrough - SLP for PMV trials - Work toward getting off IV meds so she can be candidate for rehab vs. SNF vs.  LTACH   Patient chronically critically ill due to respiratory failure, metabolic encephalopathy Interventions to address this today weaning precedex, weaning vent Risk of deterioration without these  interventions is high   Multidisciplinary rounds were performed with the ICU team.  Patient would [facility] from [facility].  Critical care provider statement:    Critical care time (minutes):  33   Critical care time was exclusive of:  Separately billable procedures and  treating other patients   Critical care was necessary to treat or prevent imminent or  life-threatening deterioration of the following conditions:  acute on chronichypoxemic respiartory failure, trache status, peg status, advanced copd   Critical care was time spent personally by me on the following  activities:  Development of treatment plan with patient or surrogate,  discussions with consultants, evaluation of patient's response to  treatment, examination of patient, obtaining history from patient or  surrogate, ordering and performing treatments and interventions, ordering  and review of laboratory studies and re-evaluation of patient's condition   I assumed direction of critical care for this patient from another  provider in my specialty: no    *This note was dictated using voice recognition software/Dragon.  Despite best efforts to proofread, errors can occur which can change the meaning.  Any change was purely unintentional.     Ottie Glazier, M.D.  [facility] [place]

## 2020-06-27 NOTE — TOC Progression Note (Signed)
Transition of Care [facility]) - Progression Note    Patient Details  Name: Virginia [name] MRN: [id number] Date of Birth: [birth year]  Transition of Care [facility]) CM/SW Contact  Marina Goodell Phone Number: [phone] 06/27/2020, 4:07 PM  Clinical Narrative:     CSW spoke with patient's daughter-in-law Virginia [name] [phone], who [place] visiting the patient. CSW explained TOC role in patient care. Ms. Virginia [name] requested an update on LTACH placement.  CSW stated the [facility] representative was looking over the patient's chart to see if they could offer a bed. CSW stated I would reach out to Virginia [name] or Virginia [name] as soon as I had an update. CSW explained bed offer and insurance auth process. Ms. Virginia [name] verbalized understanding.    Barriers to Discharge: Continued Medical Work up  Expected Discharge Plan and Services   In-house Referral: Clinical Social Work,Hospice / Palliative Care   Post Acute Care Choice: Durable Medical Equipment (Oxygen 4L) Living arrangements for the past 2 months: Apartment                                       Social Determinants of Health (SDOH) Interventions    Readmission Risk Interventions No flowsheet data found.

## 2020-06-27 NOTE — Consult Note (Signed)
PHARMACY CONSULT NOTE  Pharmacy Consult for Electrolyte Monitoring and Replacement   Recent Labs: Potassium (mmol/L)  Date Value  06/27/2020 4.0   Magnesium (mg/dL)  Date Value  48/25/0037 1.9   Calcium (mg/dL)  Date Value  04/88/8916 8.9   Albumin (g/dL)  Date Value  94/50/3888 3.0 (L)   Phosphorus (mg/dL)  Date Value  28/00/3491 5.6 (H)   Sodium (mmol/L)  Date Value  06/27/2020 141   Corrected Ca: 9.1 mg/dL  Assessment: 70 yo female admitted with acute on chronic hypoxic hypercapnic respiratory failure and acute encephalopathy secondary to narcotic use, AECOPD requiring mechanical intubation. Patient had PEG tube placed 12/29. Pt placed back on trach this morning; pt tolerating well. Pt tolerating tube feeds at goal rate via PEG tube. Refeed labs stable.   Nutrition: Tube feeds  Goal of Therapy:  Potassium 4.0 - 5.1 mmol/L Magnesium 2.0 - 2.4 mg/dL All Other Electrolytes within normal limits  Plan:   No replacement indicated today  Next labs tomorrow AM  Pricilla Riffle, PharmD Clinical Pharmacist 06/27/2020 2:24 PM

## 2020-06-27 NOTE — Progress Notes (Signed)
Pt remains on trach collar, 35% overnight. Rectal tube removed this AM d/t substantial leakage and thickening of stool. Pericare and partial bath completed this AM post removal. Pt's VSS overnight. Foley remains in place. Turn q2. Pt in no acute distress @ this time. Will continue to monitor.

## 2020-06-27 NOTE — Evaluation (Signed)
Passy-Muir Speaking Valve - Evaluation Patient Details  Name: Virginia [name] MRN: [id number] Date of Birth: 1951-03-30  Today's Date: 06/27/2020 Time: 1315-1415 SLP Time Calculation (min) (ACUTE ONLY): 60 min  Past Medical History:  Past Medical History:  Diagnosis Date  . Anemia   . Anxiety   . Arthritis   . Atrial fibrillation (HCC)   . C. difficile colitis 09/2015  . COPD (chronic obstructive pulmonary disease) (HCC)   . Depression   . Dyspnea   . Dysrhythmia   . Fibromyalgia   . H/O tracheostomy   . Hep C w/ coma, chronic   . Hypertension   . MRSA pneumonia (HCC) 2017  . On home oxygen therapy    3 L/M   . Osteoporosis   . Peripheral neuropathy   . RLS (restless legs syndrome)   . S/P percutaneous endoscopic gastrostomy (PEG) tube placement (HCC) 09/2015  . Ventilator associated pneumonia [facility]) 10/2015   [facility] [place]   Past Surgical History:  Past Surgical History:  Procedure Laterality Date  . ABDOMINAL HYSTERECTOMY    . BREAST SURGERY Bilateral    Breast Implants  . DILATION AND CURETTAGE OF UTERUS    . PEG PLACEMENT N/A 06/21/2020   Procedure: PERCUTANEOUS ENDOSCOPIC GASTROSTOMY (PEG) PLACEMENT;  Surgeon: Regis Bill, MD;  Location: [facility] ENDOSCOPY;  Service: Endoscopy;  Laterality: N/A;  . REVERSE SHOULDER ARTHROPLASTY Right 03/06/2017   Procedure: REVERSE SHOULDER ARTHROPLASTY;  Surgeon: Christena Flake, MD;  Location: [facility] ORS;  Service: Orthopedics;  Laterality: Right;  . ROTATOR CUFF REPAIR Bilateral   . TRACHEOSTOMY TUBE PLACEMENT N/A 06/15/2020   Procedure: TRACHEOSTOMY;  Surgeon: Geanie Logan, MD;  Location: [facility] ORS;  Service: ENT;  Laterality: N/A;   HPI:  Pt is a 70 yo female with severe COPD admitted with acute on chronic hypoxic hypercapnic respiratory failure and acute encephalopathy secondary to narcotic use and AECOPD requiring mechanical intubation. Pt received both Tracheostomy and PEG placement. She is currently weaning  from vent to TC. Per pt report, this is pt's Second time w/ a Tracheostomy and PEG.   Assessment / Plan / Recommendation Clinical Impression  Pt seen today for PMV assessment. She has successfully weaned from Vent to [facility] trials -- tolerating full night and morning of TC wean after several days of few hours at a time. Pt has a Shiley size 6, cuffed w/ cuff Deflated at Baseline. O2 sats 94-97%, RR 19-22, HR upper 80s at baseline. Pt is alert w/ good eye contact and mouthing for communication but endorses min anxiety w/ PMV use and swallowing. PMV eval was explained to pt; pt's need to maintain a calm breathing pattern and monitor her anxiety informing this SLP if becoming too anxious. PMV use and wear were discussed - pt has had a Tracheostomy w/ PMV in past. PMV eval revealed a gravely vocal quality during finger occlusion trials but pt was able to attain phonation. PMV was then placed (cuff deflated at Baseline) w/ good tolerance immediately; pt gave no c/o difficulty w/ exhalation or discomfort breathing at rest. Pt conversed in short sentences w/ low volume and gravely vocal quality but intelligibility was grossly adequate in a quiet setting. Pt was encouraged to sit quietly and "get used to" the PMV wearing. Oral care given to moisten mouth, few single ice chips to moisten mouth while PMV placed. Pt was able to maintain calm breathing w/ tolerance of wearing the PMV for verbal communication w/ SLP for ~20 mins w/out  desat or discomfort. D/t pt's quick fatigue w/ any exertion/tasks, PMV was removed then to allow pt to rest. ST services will f/u per POC to provide opportunity for pt to increase wear and Stamina w/ PMV placement during conversation and communication in ADLs w/ others. NSG updated. Precautions posted in room. SLP Visit Diagnosis: Aphonia (R49.1) (tracheostomy)    SLP Assessment  Patient needs continued Speech Lanaguage Pathology Services    Follow Up Recommendations   (TBD)     Frequency and Duration min 3x week  2 weeks    PMSV Trial PMSV was placed for: 20 mins Able to redirect subglottic air through upper airway: Yes Able to Attain Phonation: Yes Voice Quality: Low vocal intensity (gravely) Able to Expectorate Secretions: No attempts Level of Secretion Expectoration with PMSV: Not observed Breath Support for Phonation: Adequate Intelligibility: Intelligibility reduced Word: 75-100% accurate Phrase: 75-100% accurate Sentence: 75-100% accurate Conversation: 75-100% accurate Respirations During Trial: 21 SpO2 During Trial: 97 % Pulse During Trial: 88 Behavior: Alert;Controlled;Cooperative;Anxious;Expresses self well;Good eye contact;Responsive to questions   Tracheostomy Tube  Additional Tracheostomy Tube Assessment Trach Collar Period: at baseline Secretion Description: min+ Frequency of Tracheal Suctioning: PRN Level of Secretion Expectoration: Tracheal    Vent Dependency  Vent Dependent: No (weaning to TC) FiO2 (%): 35 %    Cuff Deflation Trial  GO Tolerated Cuff Deflation: Yes Length of Time for Cuff Deflation Trial: baseline Behavior: Alert;Cooperative;Expresses self well;Good eye contact;Anxious;Oriented X3;Responsive to questions;Smiling Cuff Deflation Trial - Comments: at her Baseline        Oniyah Rohe 06/27/2020, 4:37 PM Jerilynn Som, MS, CCC-SLP Speech Language Pathologist Rehab Services [phone]

## 2020-06-28 DIAGNOSIS — J441 Chronic obstructive pulmonary disease with (acute) exacerbation: Secondary | ICD-10-CM | POA: Diagnosis not present

## 2020-06-28 DIAGNOSIS — J9621 Acute and chronic respiratory failure with hypoxia: Secondary | ICD-10-CM | POA: Diagnosis not present

## 2020-06-28 DIAGNOSIS — J9622 Acute and chronic respiratory failure with hypercapnia: Secondary | ICD-10-CM | POA: Diagnosis not present

## 2020-06-28 LAB — BASIC METABOLIC PANEL
Anion gap: 8 (ref 5–15)
BUN: 15 mg/dL (ref 8–23)
CO2: 40 mmol/L — ABNORMAL HIGH (ref 22–32)
Calcium: 9.5 mg/dL (ref 8.9–10.3)
Chloride: 95 mmol/L — ABNORMAL LOW (ref 98–111)
Creatinine, Ser: 0.53 mg/dL (ref 0.44–1.00)
GFR, Estimated: >60 mL/min (ref >60)
Glucose, Bld: 105 mg/dL — ABNORMAL HIGH (ref 70–99)
Potassium: 4.3 mmol/L (ref 3.5–5.1)
Sodium: 143 mmol/L (ref 135–145)

## 2020-06-28 LAB — CBC WITH DIFFERENTIAL/PLATELET
Abs Immature Granulocytes: 0.15 10*3/uL — ABNORMAL HIGH (ref 0.00–0.07)
Basophils Absolute: 0 10*3/uL (ref 0.0–0.1)
Basophils Relative: 1 %
Eosinophils Absolute: 0.5 10*3/uL (ref 0.0–0.5)
Eosinophils Relative: 6 %
HCT: 28.1 % — ABNORMAL LOW (ref 36.0–46.0)
Hemoglobin: 8.4 g/dL — ABNORMAL LOW (ref 12.0–15.0)
Immature Granulocytes: 2 %
Lymphocytes Relative: 16 %
Lymphs Abs: 1.3 10*3/uL (ref 0.7–4.0)
MCH: 29.5 pg (ref 26.0–34.0)
MCHC: 29.9 g/dL — ABNORMAL LOW (ref 30.0–36.0)
MCV: 98.6 fL (ref 80.0–100.0)
Monocytes Absolute: 0.7 10*3/uL (ref 0.1–1.0)
Monocytes Relative: 9 %
Neutro Abs: 5.3 10*3/uL (ref 1.7–7.7)
Neutrophils Relative %: 66 %
Platelets: 316 10*3/uL (ref 150–400)
RBC: 2.85 MIL/uL — ABNORMAL LOW (ref 3.87–5.11)
RDW: 14.6 % (ref 11.5–15.5)
WBC: 7.9 10*3/uL (ref 4.0–10.5)
nRBC: 0 % (ref 0.0–0.2)

## 2020-06-28 LAB — GLUCOSE, CAPILLARY
Glucose-Capillary: 92 mg/dL (ref 70–99)
Glucose-Capillary: 84 mg/dL (ref 70–99)
Glucose-Capillary: 101 mg/dL — ABNORMAL HIGH (ref 70–99)
Glucose-Capillary: 98 mg/dL (ref 70–99)
Glucose-Capillary: 109 mg/dL — ABNORMAL HIGH (ref 70–99)

## 2020-06-28 LAB — MAGNESIUM: Magnesium: 1.9 mg/dL (ref 1.7–2.4)

## 2020-06-28 LAB — PHOSPHORUS: Phosphorus: 5.3 mg/dL — ABNORMAL HIGH (ref 2.5–4.6)

## 2020-06-28 MED ORDER — JEVITY 1.2 CAL PO LIQD
237.0000 mL | Freq: Every day | ORAL | Status: DC
  Administered 2020-06-28 – 2020-07-01 (×19): 237 mL
  Administered 2020-07-02: 60 mL
  Administered 2020-07-02: 237 mL
  Administered 2020-07-02: 120 mL
  Administered 2020-07-03: 237 mL

## 2020-06-28 MED ORDER — PROSOURCE TF PO LIQD
45.0000 mL | Freq: Every day | ORAL | Status: DC
  Administered 2020-06-29 – 2020-07-13 (×15): 45 mL
  Filled 2020-06-28 (×15): qty 45

## 2020-06-28 MED ORDER — APIXABAN 5 MG PO TABS
5.0000 mg | ORAL_TABLET | Freq: Two times a day (BID) | ORAL | Status: DC
  Administered 2020-06-28 – 2020-07-06 (×17): 5 mg via ORAL
  Filled 2020-06-28 (×17): qty 1

## 2020-06-28 MED ORDER — FREE WATER
60.0000 mL | Freq: Every day | Status: DC
  Administered 2020-06-28 – 2020-07-17 (×89): 60 mL

## 2020-06-28 NOTE — Progress Notes (Signed)
[facility] Progress Note  Patient: Virginia [name]    KGM:[id number]  DOA: 06/06/2020     Date of Service: the patient was seen and examined on 06/28/2020  Chief Complaint  Patient presents with  . Shortness of Breath   Brief hospital course: 70 yo female with severe COPD admitted with acute on chronic hypoxic hypercapnic respiratory failure and acute encephalopathy secondary to narcotic use and AECOPD requiring mechanical intubation  Now s/p trach/PEG working on trach weaning.  On TC at present, anxious. S/p precedex.  06/28/19- patient is improved.  Signed out to [facility]   1/5 patient seems to be stable, AAOx3, complaining of mild productive cough, denies any chest pain or palpitations, denies any abdominal pain.   Currently further plan is continue on trach collar and continue PEG tube feeding.  Awaiting for LTAC placement   Assessment and Plan: Acute on chronic hypoxemic and hypercapnic respiratory failure thought secondary to polypharmacy with inability to wean from ventilator due to muscular deconditioning now s/p tracheostomy.  Dysphagia, moderate protein calorie malnutrition- now s/p PEG  Severe baseline COPD- complicated trach wean  Muscular deconditioning  Chronic anxiety/depression  Metabolic encephalopathy/agitation- s/p precedex gtt Mental status is improved - Weaned off precedex, use clonidine taper as bridge -Continue fentanyl patch - Switch IV steroids to PO steroids x 4 more days then stop - Switched from standing duonebs from duonebs to brovana/yupelri - PT/OT up to chair - Continue standing clonazepam, use valium per tube for breakthrough - SLP for PMV trials - Work toward getting off IV meds so she can be candidate for rehab vs. SNF vs. LTACH  Paroxysmal A. Fib Continue Eliquis  Body mass index is 22.86 kg/m.  Nutrition Problem: Increased nutrient needs Etiology: catabolic illness (COPD) Interventions: Interventions: Refer to RD  note for recommendations      Diet: PEG tube feeding DVT Prophylaxis: Eliquis     Advance goals of care discussion: Full code  Family Communication: family was not present at bedside, at the time of interview.  The pt provided permission to discuss medical plan with the family. Opportunity was given to ask question and all questions were answered satisfactorily.   Disposition:  Pt is from Home, admitted with respiratory failure s/p PEG tube and trach collar, still has trach collar, which precludes a safe discharge. Discharge to LTAC, when approved  Subjective: No significant overnight events, patient feeling improvement in the shortness of breath, still on trach collar, mild productive cough, denied any other symptoms.  Getting PEG tube, denies any abdominal pain, no nausea vomiting or diarrhea.  Physical Exam: General:  alert oriented to time, place, and person.  Appear in mild distress, affect appropriate Eyes: PERRLA ENT: Oral Mucosa Clear, moist  Neck: no JVD,  Cardiovascular: S1 and S2 Present, no Murmur,  Respiratory: good respiratory effort, Bilateral Air entry equal and Decreased, mild Crackles, no wheezes, s/p Trech color Abdomen: Bowel Sound present, Soft and no tenderness,  Skin: no rashes Extremities: no Pedal edema, no calf tenderness Neurologic: without any new focal findings Gait not checked due to patient safety concerns  Vitals:   06/28/20 1325 06/28/20 1400 06/28/20 1500 06/28/20 1526  BP: (!) 160/90 140/84 (!) 166/95   Pulse: 87 81 94   Resp: (!) 25 (!) 28 (!) 21   Temp:    99.3 F (37.4 C)  TempSrc:    Oral  SpO2: 100% 100% 97%   Weight:      Height:  Intake/Output Summary (Last 24 hours) at 06/28/2020 1619 Last data filed at 06/28/2020 1526 Gross per 24 hour  Intake 714 ml  Output 3675 ml  Net -2961 ml   Filed Weights   06/25/20 0500 06/27/20 0500 06/28/20 0438  Weight: 62 kg 57.8 kg 56.7 kg    Data Reviewed: I have personally reviewed  and interpreted daily labs, tele strips, imagings as discussed above. I reviewed all nursing notes, pharmacy notes, vitals, pertinent old records I have discussed plan of care as described above with RN and patient/family.  CBC: Recent Labs  Lab 06/24/20 0356 06/25/20 0336 06/26/20 0430 06/27/20 0540 06/28/20 0500  WBC 8.9 7.5 7.6 6.7 7.9  NEUTROABS 7.4 5.8 5.5 4.6 5.3  HGB 7.6* 7.2* 7.2* 7.8* 8.4*  HCT 24.0* 24.1* 23.8* 26.0* 28.1*  MCV 96.0 99.6 98.8 98.5 98.6  PLT 145* 153 201 241 751   Basic Metabolic Panel: Recent Labs  Lab 06/24/20 0356 06/25/20 0336 06/26/20 0430 06/27/20 0540 06/28/20 0500  NA 139 138 138 141 143  K 4.4 4.3 4.1 4.0 4.3  CL 99 98 95* 93* 95*  CO2 34* 33* 37* 41* 40*  GLUCOSE 104* 126* 115* 102* 105*  BUN '13 17 15 15 15  ' CREATININE 0.59 0.55 0.59 0.57 0.53  CALCIUM 8.6* 8.2* 8.3* 8.9 9.5  MG 2.0 1.8 1.8 1.9 1.9  PHOS 3.6 4.2 4.5 5.6* 5.3*    Studies: No results found.  Scheduled Meds: . sodium chloride   Intravenous Once  . apixaban  5 mg Oral BID  . arformoterol  15 mcg Nebulization BID  . chlorhexidine gluconate (MEDLINE KIT)  15 mL Mouth Rinse BID  . Chlorhexidine Gluconate Cloth  6 each Topical Daily  . clonazePAM  1 mg Per Tube BID  . feeding supplement (JEVITY 1.2 CAL)  237 mL Per Tube 6 X Daily  . [START ON 06/29/2020] feeding supplement (PROSource TF)  45 mL Per Tube Daily  . fentaNYL  1 patch Transdermal Q72H  . free water  60 mL Per Tube 6 X Daily  . gabapentin  300 mg Per Tube TID  . insulin aspart  0-6 Units Subcutaneous Q4H  . mouth rinse  15 mL Mouth Rinse 10 times per day  . metoprolol tartrate  50 mg Per Tube BID  . pantoprazole (PROTONIX) IV  40 mg Intravenous QHS  . QUEtiapine  25 mg Per Tube QHS  . sodium chloride flush  10-40 mL Intracatheter Q12H  . sodium chloride flush  3 mL Intravenous Q12H   Continuous Infusions: . sodium chloride Stopped (06/25/20 1842)  . sodium chloride     PRN Meds: sodium chloride,  acetaminophen (TYLENOL) oral liquid 160 mg/5 mL, diazepam, ipratropium-albuterol, labetalol, metoprolol tartrate, ondansetron (ZOFRAN) IV, oxyCODONE, polyethylene glycol, sodium chloride flush, sodium chloride flush  Time spent: 35 minutes  Author: Val Riles. MD [facility] 06/28/2020 4:19 PM  To reach On-call, see care teams to locate the attending and reach out to them via [email or web address]. If 7PM-7AM, please contact night-coverage If you still have difficulty reaching the attending provider, please page the [facility] (Director on Call) for [facility] on amion for assistance.

## 2020-06-28 NOTE — Evaluation (Signed)
Clinical/Bedside Swallow Evaluation Patient Details  Name: ANIA LEVAY MRN: [id number] Date of Birth: 09/14/1950  Today's Date: 06/28/2020 Time: SLP Start Time (ACUTE ONLY): 15 SLP Stop Time (ACUTE ONLY): 1320 SLP Time Calculation (min) (ACUTE ONLY): 60 min  Past Medical History:  Past Medical History:  Diagnosis Date  . Anemia   . Anxiety   . Arthritis   . Atrial fibrillation ([place])   . C. difficile colitis 09/2015  . COPD (chronic obstructive pulmonary disease) ([place])   . Depression   . Dyspnea   . Dysrhythmia   . Fibromyalgia   . H/O tracheostomy   . Hep C w/ coma, chronic   . Hypertension   . MRSA pneumonia ([place]) 2017  . On home oxygen therapy    3 L/M   . Osteoporosis   . Peripheral neuropathy   . RLS (restless legs syndrome)   . S/P percutaneous endoscopic gastrostomy (PEG) tube placement ([place]) 09/2015  . Ventilator associated pneumonia [facility]) 10/2015   [facility], [place]   Past Surgical History:  Past Surgical History:  Procedure Laterality Date  . ABDOMINAL HYSTERECTOMY    . BREAST SURGERY Bilateral    Breast Implants  . DILATION AND CURETTAGE OF UTERUS    . PEG PLACEMENT N/A 06/21/2020   Procedure: PERCUTANEOUS ENDOSCOPIC GASTROSTOMY (PEG) PLACEMENT;  Surgeon: Lesly Rubenstein, MD;  Location: [facility] ENDOSCOPY;  Service: Endoscopy;  Laterality: N/A;  . REVERSE SHOULDER ARTHROPLASTY Right 03/06/2017   Procedure: REVERSE SHOULDER ARTHROPLASTY;  Surgeon: Corky Mull, MD;  Location: [facility] ORS;  Service: Orthopedics;  Laterality: Right;  . ROTATOR CUFF REPAIR Bilateral   . TRACHEOSTOMY TUBE PLACEMENT N/A 06/15/2020   Procedure: TRACHEOSTOMY;  Surgeon: Clyde Canterbury, MD;  Location: [facility] ORS;  Service: ENT;  Laterality: N/A;   HPI:  Pt is a 70 yo female with severe COPD admitted with acute on chronic hypoxic hypercapnic respiratory failure and acute encephalopathy secondary to narcotic use and AECOPD requiring mechanical intubation. Pt received both  Tracheostomy and PEG placement. She is currently weaning from vent to TC. Per pt report, this is pt's Second time w/ a Tracheostomy and PEG.  She was recently hospitalized from 12/10 to 12/12 following treatment of AECOPD.  She presented back to [facility] ER on 12/14 via EMS with altered mental status, cough, worsening shortness of breath, and wheezing. Pts daughter reports she has been taking narcotics along with seroquel. However, due to lack of improvement of mentation at admit, she required mechanical intubation. COVID-19/Influenza PCR and CXR negative. She was subsequently admitted to ICU for additional workup and treatment.   Assessment / Plan / Recommendation Clinical Impression  Pt seen today for initial swallowing assessment. She has recently weaned to [employer] and tolerating this well. She is able to use the PMV during the day for extended periods of time monitoring her anxiety level as well as comfort w/ breathing. NSG is monitoring wear as well; O2 sats and secretions. Encouraged PMV wear but not to overdo it to the point of fatigue. Discussed the process of PMV wear w/ all oral intake and resting b/f PMV wear/po tasks to avoid fatigue w/ increased exertion thus risk for aspiration. Pt gave verbal agreement. PMV was in place for this evaluation. O2 sats 95%, RR low 20s. Pt appears to present w/ adequate oropharyngeal phase swallow w/ No oropharyngeal phase dysphagia noted, No neuromuscular deficits noted. Pt consumed po trials of thin liquids Via CUP w/ No overt, clinical s/s of aspiration during po trials.  Pt appears at reduced risk for aspiration following general aspiration precautions. During po trials, pt consumed the thin liquid trials w/ no overt coughing, decline in vocal quality, or change in respiratory presentation during/post trials. Rest Breaks given to lessen any WOB - education given to pt on importance of this. Oral phase appeared [facility] w/ timely bolus management and control of bolus propulsion  for A-P transfer for swallowing. Oral clearing achieved. OM Exam appeared [facility] w/ no unilateral weakness noted. Speech Clear. Pt fed self w/ setup support. No trials of solids/foods were given this assessment d/t the nature of exertion w/ increased demand/texture. Will continue w/ ongoing assessment as pt continues to improve w/ [place] w/ PMV wear and overall pulmonary status. Pt has a PEG placement w/ TFs baseline for nutrition. Recommend a Clear liquid diet -- Thin liquids VIA CUP. No Straws for best oral control. Recommend general aspiration precautions. Education given on conservation of energy, thin liquids vs food consistencies and easy to eat options; general aspiration precautions. ST services will continue to follow w/ ongoing assessment, education. Pt agreed. NSG updated, agreed. Precautions posted in room. SLP Visit Diagnosis: Dysphagia, unspecified (R13.10) (tracheostomy)    Aspiration Risk  Mild aspiration risk (PEG TFs)    Diet Recommendation  Clear liquid diet - Thin consistency -- NO STRAWS. General aspiration precautions. Pt MUST WEAR THE PMV for ALL ORAL INTAKE. Rest Breaks during po intake for conservation of energy.   Medication Administration: Via alternative means (PEG)    Other  Recommendations Recommended Consults:  (Dietician following; PT, OT) Oral Care Recommendations: Oral care BID;Oral care before and after PO;Patient independent with oral care (setup) Other Recommendations:  (n/a)   Follow up Recommendations  (TBD)      Frequency and Duration min 2x/week  2 weeks       Prognosis Prognosis for Safe Diet Advancement: Fair (-Good) Barriers to Reach Goals: Severity of deficits (debilitation)      Swallow Study   General Date of Onset: 06/06/20 HPI: Pt is a 70 yo female with severe COPD admitted with acute on chronic hypoxic hypercapnic respiratory failure and acute encephalopathy secondary to narcotic use and AECOPD requiring mechanical intubation. Pt received  both Tracheostomy and PEG placement. She is currently weaning from vent to TC. Per pt report, this is pt's Second time w/ a Tracheostomy and PEG.  She was recently hospitalized from 12/10 to 12/12 following treatment of AECOPD.  She presented back to [facility] ER on 12/14 via EMS with altered mental status, cough, worsening shortness of breath, and wheezing. Pts daughter reports she has been taking narcotics along with seroquel. However, due to lack of improvement of mentation at admit, she required mechanical intubation. COVID-19/Influenza PCR and CXR negative. She was subsequently admitted to ICU for additional workup and treatment. Type of Study: Bedside Swallow Evaluation Previous Swallow Assessment: none Diet Prior to this Study: NPO (ice chips for pleasure w/ PMV placed) Temperature Spikes Noted: No (wbc 7.9) Respiratory Status: Trach Collar (5-8L) History of Recent Intubation: Yes Length of Intubations (days): 10 days Date extubated: 06/15/20 (tracheostomy performed) Behavior/Cognition: Alert;Cooperative;Pleasant mood (PMV placed) Oral Cavity Assessment: Within Functional Limits Oral Care Completed by SLP: Yes Oral Cavity - Dentition: Edentulous Vision: Functional for self-feeding Self-Feeding Abilities: Able to feed self;Needs set up Patient Positioning: Upright in bed (needed positioning support) Baseline Vocal Quality: Low vocal intensity (raspy, gravely. PMV placed) Volitional Cough: Strong;Congested (min) Volitional Swallow: Able to elicit    Oral/Motor/Sensory Function Overall Oral Motor/Sensory Function:  Within functional limits   Ice Chips Ice chips: Within functional limits Presentation: Spoon (fed; 3+ trials)   Thin Liquid Thin Liquid: Within functional limits Presentation: Cup;Self Fed (~3 ozs total)    Nectar Thick Nectar Thick Liquid: Not tested   Honey Thick Honey Thick Liquid: Not tested   Puree Puree: Not tested   Solid     Solid: Not tested       Jerilynn Som, MS, CCC-SLP Speech Language Pathologist Rehab Services [phone] Carline Dura 06/28/2020,6:34 PM

## 2020-06-28 NOTE — Progress Notes (Signed)
Nutrition Follow-up  DOCUMENTATION CODES:   Not applicable  INTERVENTION:   Change to Jevity 1.2- 6 cans daily- Start with 1/2 can 6 times daily and advance as tolerated. Flush with 20m of water before and after each feed  Pro-Source 479mdaily via tube, provides 40kcal and 11g of protein per serving   Regimen provides 1750 kcal/day, 91g/day of protein, 24g/day of fiber, 1506 mL H2O daily.  Recommend free water flush of 90 mL Q4hrs. Provides a total of 1706 mL H2O daily including water in TF regimen.  NUTRITION DIAGNOSIS:   Increased nutrient needs related to catabolic illness (COPD) as evidenced by estimated needs. Ongoing.  GOAL:   Patient will meet greater than or equal to 90% of their needs Met with TF regimen.  MONITOR:   PO intake,Supplement acceptance,Labs,Weight trends,I & O's  ASSESSMENT:   [age]ear old female with PMHx of COPD, hx tracheostomy tube placement and PEG tube placement in 2017, HTN, depression, RLS, osteoporosis, hepatitis C, A-fib, anxiety, arthritis admitted with acute on chronic hypoxic hypercapnic respiratory failure and acute encephalopathy secondary to narcotic use and acute exacerbation of COPD.   Pt s/p PEG tube 12/29 Pt s/p PICC line 12/31  Pt tolerating tube feeds well via PEG. Tube feeds stopped at 0500 this morning as pt was complaining of extreme fullness. RN requesting bolus feeds; will change pt over to bouls feeds and see how she tolerates. Refeed labs stable; phosphorus slightly elevated but improved.   Per chart, pt down 5lbs since admit but appears to be back at her UBW.  Medications reviewed and include: insulin, protonix  Labs reviewed: K 4.3 wnl, P 5.3(H), Mg 1.9 wnl Hgb 8.4(L), Hct 28.1(L) cbgs- 92, 84 x 24 hrs  Diet Order:   Diet Order            Diet NPO time specified  Diet effective now                EDUCATION NEEDS:   No education needs have been identified at this time  Skin:  Skin Assessment: Reviewed RN  Assessment (MASD) Skin Integrity Issues:: Incisions Incisions: closed incision to neck  Last BM:  1/5- type 6  Height:   Ht Readings from Last 1 Encounters:  06/06/20 5' 2.01" (1.575 m)   Weight:   Wt Readings from Last 1 Encounters:  06/28/20 56.7 kg   Ideal Body Weight:  50 kg  BMI:  Body mass index is 22.86 kg/m.  Estimated Nutritional Needs:   Kcal:  [id number]:  85-95 grams  Fluid:  1.5-1.7 L/day  CaKoleen DistanceS, RD, LDN Please refer to AMSummit Asc LLPor RD and/or RD on-call/weekend/after hours pager

## 2020-06-28 NOTE — Progress Notes (Signed)
Occupational Therapy Treatment Patient Details Name: Virginia [name] MRN: [id number] DOB: [birth year] Today's Date: 06/28/2020    History of present illness 70 yo female with severe COPD admitted with acute on chronic hypoxic hypercapnic respiratory failure and acute encephalopathy secondary to narcotic use and AECOPD requiring mechanical intubation. Initially intubated from 12/14-12/16 and then again from 12/18-12/23. Now with + trach collar. Recent hospitalization from 12/10-12/12/21.   OT comments  Pt seen for OT treatment this date to f/u re: safety and tolerance for ADLs. Pt pleasant and agreeable to session. OT converts pt's ICU bed to chair position to increase posture for engaging in grooming tasks. Pt requires MOD A for brushing her hair due to matting from hospital stay as well as decreased fxl activity tolerance. While pt demos appropriate ROM to complete task, she can only tolerate ~20-30 second bouts of activity before taking a rest break. OT then engages pt in below-listed exercises to increase core strength and control for improve expulsion of phlegm and general strength of lungs/quality of breathing. Pt tolerates session well. Her RR did increased with activity, peaking at 37 breaths per minute, but she was primarily able to maintain ~18-24 breaths per minute with MIN verbal cues to initiate rest breaks as needed. Continue to anticipate pt that pt will require continued rehabilitation outside of hospital stay at [facility].    Follow Up Recommendations  SNF    Equipment Recommendations  Other (comment) (defer to next level of care)    Recommendations for Other Services      Precautions / Restrictions Precautions Precautions: Fall Restrictions Weight Bearing Restrictions: No       Mobility Bed Mobility               General bed mobility comments: deferred  Transfers                      Balance                                            ADL either performed or assessed with clinical judgement   ADL Overall ADL's : Needs assistance/impaired     Grooming: Brushing hair;Bed level;Moderate assistance Grooming Details (indicate cue type and reason): bed converted to chair position. Pt with ROM to complete task, but requires MOD A purely d/t very poor functional activity tolerance. Pt is on trach collar throughout. Pt's RR between 18-22 at rest and increases with activity, peaking at 37 when performing short, 20-30 second bouts of brushing her hair.                                     Vision Patient Visual Report: No change from baseline     Perception     Praxis      Cognition Arousal/Alertness: Awake/alert Behavior During Therapy: WFL for tasks assessed/performed Overall Cognitive Status: Within Functional Limits for tasks assessed                                          Exercises Other Exercises Other Exercises: OT engages pt in contralateral reaching for 1 set x5 reps per side (10 total) and straigh arm raises for 2 sets  x5 reps with ~3 min rest break between. Other Exercises: Engaged pt in seated UB grooming of brushing her hair for which she reuqired MOD A d/t low fxl activity tolerance.   Shoulder Instructions       General Comments      Pertinent Vitals/ Pain       Pain Assessment: No/denies pain  Home Living                                          Prior Functioning/Environment              Frequency  Min 1X/week        Progress Toward Goals  OT Goals(current goals can now be found in the care plan section)  Progress towards OT goals: Progressing toward goals  Acute Rehab OT Goals Patient Stated Goal: To return home safely OT Goal Formulation: With patient Time For Goal Achievement: 07/06/20 Potential to Achieve Goals: Good  Plan Discharge plan remains appropriate    Co-evaluation                 AM-PAC OT "6 Clicks"  Daily Activity     Outcome Measure   Help from another person eating meals?: A Little Help from another person taking care of personal grooming?: A Lot Help from another person toileting, which includes using toliet, bedpan, or urinal?: A Lot Help from another person bathing (including washing, rinsing, drying)?: A Lot Help from another person to put on and taking off regular upper body clothing?: A Lot Help from another person to put on and taking off regular lower body clothing?: A Lot 6 Click Score: 13    End of Session Equipment Utilized During Treatment: Oxygen;Other (comment) (trach collar)  OT Visit Diagnosis: Other abnormalities of gait and mobility (R26.89);Muscle weakness (generalized) (M62.81)   Activity Tolerance Patient tolerated treatment well   Patient Left in bed;with call bell/phone within reach;with nursing/sitter in room   Nurse Communication Mobility status        Time: 1413-1500 OT Time Calculation (min): 47 min  Charges: OT Treatments $Self Care/Home Management : 23-37 mins $Therapeutic Exercise: 8-22 mins  Rejeana Brock, MS, OTR/L ascom [phone] 06/28/20, 7:35 PM

## 2020-06-29 DIAGNOSIS — J9621 Acute and chronic respiratory failure with hypoxia: Secondary | ICD-10-CM | POA: Diagnosis not present

## 2020-06-29 DIAGNOSIS — J441 Chronic obstructive pulmonary disease with (acute) exacerbation: Secondary | ICD-10-CM | POA: Diagnosis not present

## 2020-06-29 DIAGNOSIS — J9622 Acute and chronic respiratory failure with hypercapnia: Secondary | ICD-10-CM | POA: Diagnosis not present

## 2020-06-29 LAB — GLUCOSE, CAPILLARY
Glucose-Capillary: 102 mg/dL — ABNORMAL HIGH (ref 70–99)
Glucose-Capillary: 112 mg/dL — ABNORMAL HIGH (ref 70–99)
Glucose-Capillary: 116 mg/dL — ABNORMAL HIGH (ref 70–99)
Glucose-Capillary: 116 mg/dL — ABNORMAL HIGH (ref 70–99)
Glucose-Capillary: 82 mg/dL (ref 70–99)
Glucose-Capillary: 86 mg/dL (ref 70–99)
Glucose-Capillary: 94 mg/dL (ref 70–99)

## 2020-06-29 LAB — BASIC METABOLIC PANEL
Anion gap: 10 (ref 5–15)
BUN: 20 mg/dL (ref 8–23)
CO2: 41 mmol/L — ABNORMAL HIGH (ref 22–32)
Calcium: 9 mg/dL (ref 8.9–10.3)
Chloride: 91 mmol/L — ABNORMAL LOW (ref 98–111)
Creatinine, Ser: 0.56 mg/dL (ref 0.44–1.00)
GFR, Estimated: >60 mL/min (ref >60)
Glucose, Bld: 99 mg/dL (ref 70–99)
Potassium: 4.3 mmol/L (ref 3.5–5.1)
Sodium: 142 mmol/L (ref 135–145)

## 2020-06-29 LAB — PHOSPHORUS: Phosphorus: 5.2 mg/dL — ABNORMAL HIGH (ref 2.5–4.6)

## 2020-06-29 LAB — CBC
HCT: 28.8 % — ABNORMAL LOW (ref 36.0–46.0)
Hemoglobin: 8.5 g/dL — ABNORMAL LOW (ref 12.0–15.0)
MCH: 29.4 pg (ref 26.0–34.0)
MCHC: 29.5 g/dL — ABNORMAL LOW (ref 30.0–36.0)
MCV: 99.7 fL (ref 80.0–100.0)
Platelets: 317 10*3/uL (ref 150–400)
RBC: 2.89 MIL/uL — ABNORMAL LOW (ref 3.87–5.11)
RDW: 14.6 % (ref 11.5–15.5)
WBC: 7.7 10*3/uL (ref 4.0–10.5)
nRBC: 0 % (ref 0.0–0.2)

## 2020-06-29 LAB — MAGNESIUM: Magnesium: 2 mg/dL (ref 1.7–2.4)

## 2020-06-29 NOTE — Progress Notes (Addendum)
Progress Note    Virginia [name]  ATF:[id number] DOB: [birth year]  DOA: 06/06/2020 PCP: Idelle Crouch, MD      Brief Narrative:    Medical records reviewed and are as summarized below: All normal Virginia [name] is a 70 y.o. female severe COPD admitted with acute on chronic hypoxic hypercapnic respiratory failure and acute encephalopathy secondary to narcotic use and AECOPD requiring mechanical intubation  Now s/p trach/PEG working on trach weaning.      Assessment/Plan:   Active Problems:   Respiratory failure (HCC)   COPD exacerbation (HCC)   Nutrition Problem: Increased nutrient needs Etiology: catabolic illness (COPD)  Signs/Symptoms: estimated needs   Body mass index is 22.86 kg/m.      Acute on chronic hypoxemic and hypercapnic respiratory failure  Failure to wean from ventilator so tracheostomy was done.  She is on FiO2 of 35% at 8 L/min.  Continue to taper down oxygen.  Dysphagia, moderate protein calorie malnutrition- now s/p PEG for enteral nutrition  Severe baseline COPD-continue bronchodilators.  She is off of steroids.  Muscular deconditioning: PT and OT recommend discharge to SNF  Chronic anxiety/depression: Continue psychotropics and benzodiazepines  Metabolic encephalopathy/agitation- s/p precedex gtt Mental status has improved - Weaned off precedex, use clonidine taper as bridge -Continue fentanyl patch    Paroxysmal A. Fib Continue Eliquis        Diet Order            Diet clear liquid Room service appropriate? Yes with Assist; Fluid consistency: Thin  Diet effective now                    Consultants:  Intensivist  ENT surgeon  Procedures:  Intubation and mechanical ventilation  Tracheostomy  PEG tube placement     Medications:   . sodium chloride   Intravenous Once  . apixaban  5 mg Oral BID  . arformoterol  15 mcg Nebulization BID  . chlorhexidine gluconate (MEDLINE KIT)   15 mL Mouth Rinse BID  . Chlorhexidine Gluconate Cloth  6 each Topical Daily  . clonazePAM  1 mg Per Tube BID  . feeding supplement (JEVITY 1.2 CAL)  237 mL Per Tube 6 X Daily  . feeding supplement (PROSource TF)  45 mL Per Tube Daily  . fentaNYL  1 patch Transdermal Q72H  . free water  60 mL Per Tube 6 X Daily  . gabapentin  300 mg Per Tube TID  . insulin aspart  0-6 Units Subcutaneous Q4H  . mouth rinse  15 mL Mouth Rinse 10 times per day  . metoprolol tartrate  50 mg Per Tube BID  . pantoprazole (PROTONIX) IV  40 mg Intravenous QHS  . QUEtiapine  25 mg Per Tube QHS  . sodium chloride flush  10-40 mL Intracatheter Q12H  . sodium chloride flush  3 mL Intravenous Q12H   Continuous Infusions: . sodium chloride Stopped (06/25/20 1842)  . sodium chloride       Anti-infectives (From admission, onward)   Start     Dose/Rate Route Frequency Ordered Stop   06/21/20 1200  ceFAZolin (ANCEF) IVPB 2g/100 mL premix       Note to Pharmacy: Please have at bedside. Will be administered one hour prior to procedure. Endo staff will instruct on when to start infusion.   2 g 200 mL/hr over 30 Minutes Intravenous  Once 06/21/20 0820 06/21/20 2030   06/07/20 1400  azithromycin (ZITHROMAX) 500 mg in  sodium chloride 0.9 % 250 mL IVPB  Status:  Discontinued        500 mg 250 mL/hr over 60 Minutes Intravenous Every 24 hours 06/06/20 2030 06/06/20 2044   06/07/20 1400  azithromycin (ZITHROMAX) 500 mg in sodium chloride 0.9 % 250 mL IVPB  Status:  Discontinued        500 mg 250 mL/hr over 60 Minutes Intravenous Every 24 hours 06/06/20 2044 06/08/20 1031   06/06/20 1415  cefTRIAXone (ROCEPHIN) 2 g in sodium chloride 0.9 % 100 mL IVPB        2 g 200 mL/hr over 30 Minutes Intravenous  Once 06/06/20 1407 06/06/20 1514   06/06/20 1415  azithromycin (ZITHROMAX) 500 mg in sodium chloride 0.9 % 250 mL IVPB        500 mg 250 mL/hr over 60 Minutes Intravenous  Once 06/06/20 1407 06/06/20 1708              Family Communication/Anticipated D/C date and plan/Code Status   DVT prophylaxis: SCDs Start: 06/06/20 1430 apixaban (ELIQUIS) tablet 5 mg     Code Status: Full Code  Family Communication: None Disposition Plan:    Status is: Inpatient  Remains inpatient appropriate because:Unsafe d/c plan   Dispo: The patient is from: Home              Anticipated d/c is to: SNF              Anticipated d/c date is: 3 days              Patient currently is not medically stable to d/c.           Subjective:   Interval events noted.  She complains of generalized weakness.  Objective:    Vitals:   06/29/20 0200 06/29/20 0313 06/29/20 0746 06/29/20 0802  BP: 131/79 (!) 155/91 (!) 178/99   Pulse: 79 86 98   Resp: (!) _0 Temp:  98.8 F (37.1 C) 98.2 F (36.8 C)   TempSrc:      SpO2: 98% 99% 94% 98%  Weight:      Height:       No data found.   Intake/Output Summary (Last 24 hours) at 06/29/2020 1029 Last data filed at 06/29/2020 0200 Gross per 24 hour  Intake 1428 ml  Output 2275 ml  Net -847 ml   Filed Weights   06/25/20 0500 06/27/20 0500 06/28/20 0438  Weight: 62 kg 57.8 kg 56.7 kg    Exam:  GEN: NAD SKIN: Warm and dry EYES: EOMI ENT: Tracheostomy CV: RRR PULM: CTA B ABD: soft, ND, NT, +BS, + PEG tube CNS: AAO x 3, non focal EXT: No edema or tenderness GU: Foley catheter in place    Data Reviewed:   I have personally reviewed following labs and imaging studies:  Labs: Labs show the following:   Basic Metabolic Panel: Recent Labs  Lab 06/25/20 0336 06/26/20 0430 06/27/20 0540 06/28/20 0500 06/29/20 0255  NA 138 138 141 143 142  K 4.3 4.1 4.0 4.3 4.3  CL 98 95* 93* 95* 91*  CO2 33* 37* 41* 40* 41*  GLUCOSE 126* 115* 102* 105* 99  BUN _1 CREATININE 0.55 0.59 0.57 0.53 0.56  CALCIUM 8.2* 8.3* 8.9 9.5 9.0  MG 1.8 1.8 1.9 1.9 2.0  PHOS 4.2 4.5 5.6* 5.3* 5.2*   GFR Estimated Creatinine Clearance: 52.5  mL/min (by C-G formula based on SCr  of 0.56 mg/dL). Liver Function Tests: No results for input(s): AST, ALT, ALKPHOS, BILITOT, PROT, ALBUMIN in the last 168 hours. No results for input(s): LIPASE, AMYLASE in the last 168 hours. No results for input(s): AMMONIA in the last 168 hours. Coagulation profile No results for input(s): INR, PROTIME in the last 168 hours.  CBC: Recent Labs  Lab 06/24/20 0356 06/25/20 0336 06/26/20 0430 06/27/20 0540 06/28/20 0500 06/29/20 0255  WBC 8.9 7.5 7.6 6.7 7.9 7.7  NEUTROABS 7.4 5.8 5.5 4.6 5.3  --   HGB 7.6* 7.2* 7.2* 7.8* 8.4* 8.5*  HCT 24.0* 24.1* 23.8* 26.0* 28.1* 28.8*  MCV 96.0 99.6 98.8 98.5 98.6 99.7  PLT 145* 153 201 241 316 317   Cardiac Enzymes: No results for input(s): CKTOTAL, CKMB, CKMBINDEX, TROPONINI in the last 168 hours. BNP (last 3 results) No results for input(s): PROBNP in the last 8760 hours. CBG: Recent Labs  Lab 06/28/20 1524 06/28/20 2002 06/29/20 0008 06/29/20 0446 06/29/20 0745  GLUCAP 98 109* 116* 82 112*   D-Dimer: No results for input(s): DDIMER in the last 72 hours. Hgb A1c: No results for input(s): HGBA1C in the last 72 hours. Lipid Profile: No results for input(s): CHOL, HDL, LDLCALC, TRIG, CHOLHDL, LDLDIRECT in the last 72 hours. Thyroid function studies: No results for input(s): TSH, T4TOTAL, T3FREE, THYROIDAB in the last 72 hours.  Invalid input(s): FREET3 Anemia work up: No results for input(s): VITAMINB12, FOLATE, FERRITIN, TIBC, IRON, RETICCTPCT in the last 72 hours. Sepsis Labs: Recent Labs  Lab 06/26/20 0430 06/27/20 0540 06/28/20 0500 06/29/20 0255  WBC 7.6 6.7 7.9 7.7    Microbiology No results found for this or any previous visit (from the past 240 hour(s)).  Procedures and diagnostic studies:  No results found.             LOS: 23 days   [employer] Copy[employer], advertising on [email or web address]. If 7PM-7AM, please contact night-coverage at  [email or web address]     06/29/2020, 10:29 AM

## 2020-06-29 NOTE — Progress Notes (Signed)
Patient arrived on the unit  From ICU approximately 0400. Trach collar intact with 8L and 35% O2.. Lung sound with course rhonchi and diminished. Bowel sound active and peg tube patent. Receiving intermittent boluses with Jevity  6 X daily. No edema  Noted and pedal pulses 2-3 +. Suctioned approximately 0645. Patient was placed on the bed pa and had a small loose stool.

## 2020-06-29 NOTE — TOC Progression Note (Signed)
Transition of Care [facility]) - Progression Note    Patient Details  Name: Virginia [name] MRN: [id number] Date of Birth: Feb 11, 1951  Transition of Care [facility]) CM/SW [place], LCSW Phone Number: 06/29/2020, 1:36 PM  Clinical Narrative:  Patient moved to 1A Unit. CSW called [employer] copywriter to follow up. Wells Guiles confirmed she met with patient and daughter in law yesterday in ICU. Wells Guiles reported she started insurance authorization for LTAC and that there is usually a 48 hour turnaround time. Wells Guiles will update CSW when insurance authorization is obtained.      Barriers to Discharge: Continued Medical Work up  Expected Discharge Plan and Services   In-house Referral: Clinical Social Work,Hospice / Palliative Care   Post Acute Care Choice: Durable Medical Equipment (Oxygen 4L) Living arrangements for the past 2 months: Apartment                                       Social Determinants of Health (SDOH) Interventions    Readmission Risk Interventions No flowsheet data found.

## 2020-06-29 NOTE — Progress Notes (Signed)
Speech Language Pathology Treatment: Virginia [name] Speaking valve  Patient Details Name: Virginia [name] MRN: [id number] DOB: 20-Mar-1951 Today's Date: 06/29/2020 Time: 1540-0867 SLP Time Calculation (min) (ACUTE ONLY): 35 min  Assessment / Plan / Recommendation Clinical Impression  Pt seen today for ongoing assessment of toleration of PMV wear; education about use/care. Pt recently finished sitting in chair and reported min SOB; min increased WOB noted at rest. PMV was not in place upon entering room -- praised pt for not wearing and taking a rest break to calm her breathing. Pt awake, mouthed for communication. Pt is alert w/ good eye contact and mouthing for communication but endorses min anxiety re: her breathing/recovery. She has successfully weaned from Vent to [facility] trials -- tolerating full night and morning of TC wean after several days of few hours at a time. Pt has a Shiley size 6, cuffed w/ cuff Deflated at Baseline. O2 sats 94-96%, RR low20s. PMV use and placement was explained to pt again -- it is placed on the outside of the trach; also pt's need to maintain a calm breathing pattern and monitor her anxiety informing this SLP if becoming too anxious or SOB. Pt has had a Tracheostomy w/ PMV in past so she was given cues to reminders of her success in weaning then. PMV was placed briefly for a few minutes revealing a gravely, raspy vocal quality w/ min+ low volume. She was able to attain phonation and verbalizations to engage in short conversation w/ SLP. She stated no gross difficulty w/ exhalation or discomfort breathing at rest. No pain endorsed. Intelligibility was grossly adequate in a quiet setting. Pt was encouraged to lie quietly and just breath w/ the PMV placed. Oral care given to moisten mouth. Pt was able to maintain adequate breathing w/ tolerance of wearing the PMV for verbal communication w/ SLP for ~10 mins w/out desat or discomfort. However, d/t pt's quick fatigue w/ any  exertion/tasks, and min SOB at baseline d/t earlier exertion, PMV was removed then to allow pt to rest. ST services will f/u per POC to provide opportunites and education for pt to increase wear and Stamina w/ PMV placement during conversation and communication in ADLs w/ others. NSG updated. Precautions posted in room. Pt is encouraged to wear the PMV during the Day but Remove when sleepy or needing to Rest; she MUST wear it during ANY oral intake (currently on a clear liquids diet w/ PEG TFs). PMV wear must be Supervised if wearing during other Tx sessions for fatigue. Pt agreed w/ POC.    HPI HPI: Pt is a 70 yo female with severe COPD admitted with acute on chronic hypoxic hypercapnic respiratory failure and acute encephalopathy secondary to narcotic use and AECOPD requiring mechanical intubation. Pt received both Tracheostomy and PEG placement. She is currently weaning from vent to TC. Per pt report, this is pt's Second time w/ a Tracheostomy and PEG.  She was recently hospitalized from 12/10 to 12/12 following treatment of AECOPD.  She presented back to [facility] ER on 12/14 via EMS with altered mental status, cough, worsening shortness of breath, and wheezing. Pts daughter reports she has been taking narcotics along with seroquel. However, due to lack of improvement of mentation at admit, she required mechanical intubation. COVID-19/Influenza PCR and CXR negative. She was subsequently admitted to ICU for additional workup and treatment.      SLP Plan  Continue with current plan of care       Recommendations  Diet  recommendations: Thin liquid (Clear liquid currently) Liquids provided via: Cup;No straw Medication Administration: Via alternative means (PEG) Supervision: Patient able to self feed;Intermittent supervision to cue for compensatory strategies (setup support) Compensations: Minimize environmental distractions;Slow rate;Small sips/bites (MUST WEAR PMV W/ Oral Intake) Postural Changes and/or  Swallow Maneuvers: Seated upright 90 degrees;Upright 30-60 min after meal      Patient may use Passy-Muir Speech Valve: During all waking hours (remove during sleep);During PO intake/meals;During all therapies with supervision (monitor comfort level and RR during use) PMSV Supervision: Full MD: Please consider changing trach tube to :  (n/a)         Oral Care Recommendations: Oral care BID;Oral care before and after PO;Patient independent with oral care Follow up Recommendations:  (TBD) SLP Visit Diagnosis: Aphonia (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

## 2020-06-29 NOTE — Care Management Important Message (Signed)
Important Message  Patient Details  Name: Virginia [name] MRN: [id number] Date of Birth: [birth year]   Medicare Important Message Given:  Yes     [name] A [name] 06/29/2020, 10:59 AM

## 2020-06-29 NOTE — TOC Progression Note (Signed)
Transition of Care [facility]) - Progression Note    Patient Details  Name: Virginia [name] MRN: [id number] Date of Birth: 04/24/1951  Transition of Care [facility]) CM/SW [place], [place] Phone Number: [phone] 06/29/2020, 9:44 AM  Clinical Narrative:     Wells Guiles from [facility] met with patient and daughter in law [name] [phone] to speak about possible LTACH placement.      Barriers to Discharge: Continued Medical Work up  Expected Discharge Plan and Services   In-house Referral: Clinical Social Work,Hospice / Palliative Care   Post Acute Care Choice: Durable Medical Equipment (Oxygen 4L) Living arrangements for the past 2 months: Apartment                                       Social Determinants of Health (SDOH) Interventions    Readmission Risk Interventions No flowsheet data found.

## 2020-06-29 NOTE — Progress Notes (Signed)
Physical Therapy Treatment Patient Details Name: Virginia [name] MRN: [id number] DOB: 1951/04/16 Today's Date: 06/29/2020    History of Present Illness 70 yo female with severe COPD admitted with acute on chronic hypoxic hypercapnic respiratory failure and acute encephalopathy secondary to narcotic use and AECOPD requiring mechanical intubation. Initially intubated from 12/14-12/16 and then again from 12/18-12/23. Now with + trach collar. Recent hospitalization from 12/10-12/12/21.    PT Comments    Pt is making gradual progress towards goals with ability to transfer to chair this date. Pt is motivated and vitals checked decreasing to 84% with exertion. At end of session, RN notified as pt wanting to be suctioned via trach. Good endurance with there-ex. Will continue to progress as able.  Follow Up Recommendations  SNF     Equipment Recommendations   (TBD)    Recommendations for Other Services       Precautions / Restrictions Precautions Precautions: Fall Restrictions Weight Bearing Restrictions: No    Mobility  Bed Mobility Overal bed mobility: Needs Assistance Bed Mobility: Supine to Sit;Sit to Supine     Supine to sit: Mod assist     General bed mobility comments: improved technique. Able to initiate movement. Once seated at EOB, progresses to supervision.  Transfers Overall transfer level: Needs assistance Equipment used: 1 person hand held assist Transfers: Sit to/from Stand Sit to Stand: Max assist         General transfer comment: stand pivot transfer from bed->chair. Pt follows commands well and is able to tolerate Wbing through B LE. O2 sats decreased to 84% on 8L of O2 with exertion, quickly improving to 94% at rest.  Ambulation/Gait             General Gait Details: unable   Stairs             Wheelchair Mobility    Modified Rankin (Stroke Patients Only)       Balance Overall balance assessment: Needs assistance Sitting-balance  support: Single extremity supported;Feet supported Sitting balance-Leahy Scale: Fair     Standing balance support: Bilateral upper extremity supported Standing balance-Leahy Scale: Fair                              Cognition Arousal/Alertness: Awake/alert Behavior During Therapy: WFL for tasks assessed/performed Overall Cognitive Status: Within Functional Limits for tasks assessed                                        Exercises Other Exercises Other Exercises: supine ther-ex performed on B LE including SLRs, hip abd/add, heel slides, and hip add squeezes. All ther-ex performed x 15 reps. Vitals monitored throughout. Other Exercises: 2nd transfer for bed pan placement and hygiene. +2 mod assist required.    General Comments        Pertinent Vitals/Pain Pain Assessment: No/denies pain    Home Living                      Prior Function            PT Goals (current goals can now be found in the care plan section) Acute Rehab PT Goals Patient Stated Goal: To return home safely PT Goal Formulation: With patient Time For Goal Achievement: 07/06/20 Potential to Achieve Goals: Good Progress towards PT goals: Progressing toward goals  Frequency    Min 2X/week      PT Plan Current plan remains appropriate    Co-evaluation              AM-PAC PT "6 Clicks" Mobility   Outcome Measure  Help needed turning from your back to your side while in a flat bed without using bedrails?: A Lot Help needed moving from lying on your back to sitting on the side of a flat bed without using bedrails?: A Lot Help needed moving to and from a bed to a chair (including a wheelchair)?: A Lot Help needed standing up from a chair using your arms (e.g., wheelchair or bedside chair)?: A Lot Help needed to walk in hospital room?: Total Help needed climbing 3-5 steps with a railing? : Total 6 Click Score: 10    End of Session Equipment Utilized  During Treatment: Gait belt;Oxygen Activity Tolerance: Patient tolerated treatment well Patient left: in chair;with chair alarm set Nurse Communication: Mobility status PT Visit Diagnosis: Muscle weakness (generalized) (M62.81);Difficulty in walking, not elsewhere classified (R26.2)     Time: 1751-0258 PT Time Calculation (min) (ACUTE ONLY): 33 min  Charges:  $Therapeutic Exercise: 8-22 mins $Therapeutic Activity: 8-22 mins                     Elizabeth Palau, PT, DPT [phone]    [name] 06/29/2020, 3:30 PM

## 2020-06-29 NOTE — Progress Notes (Signed)
CRITICAL CARE PROGRESS NOTE    Name: Virginia [name] MRN: [id number] DOB: 1951-04-07     LOS: 65   SUBJECTIVE FINDINGS & SIGNIFICANT EVENTS    Patient description:  70 yo female with severe COPD admitted with acute on chronic hypoxic hypercapnic respiratory failure and acute encephalopathy secondary to narcotic use and AECOPD requiring mechanical intubation  Now s/p trach/PEG working on trach weaning.  On TC at present, anxious. Remains on precedex.  06/27/20- patient is improved.  Signed out to [facility] - Dr Jimmye Norman for pick up in am.   06/29/20- patient is with respiratory distress, she has high volume phlegm from trache, ive suctioned out whats possible via [place] and messaged RT to assist.  RN was able to come in and evaluate patient and she was able to communicate appropriately.   PHYSICAL EXAMINATION   Vital Signs: Temp:  [97.6 F (36.4 C)-99.3 F (37.4 C)] 97.6 F (36.4 C) (01/06 1535) Pulse Rate:  [79-103] 89 (01/06 1535) Resp:  [14-32] 17 (01/06 1535) BP: (109-178)/(73-101) 159/96 (01/06 1535) SpO2:  [94 %-100 %] 100 % (01/06 1535) FiO2 (%):  [35 %] 35 % (01/06 0802)   FiO2 (%):  [35 %] 35 %   Constitutional: anxious frail woman lying in bed  Eyes: eomi, pupils equal Ears, nose, mouth, and throat: trach in place, minmal secretions Cardiovascular: RRR,  Ext warm Respiratory: diminished with prolonged expiratory phase, occasional accessory muscle use Gastrointestinal: soft, abd wrapped after PEG, some mild tenderness at PEG insertion site MSK: muscle wasting Skin: No rashes, normal turgor, scattered bruising Neurologic: moves all 4 ext to command, weak Psychiatric: anxious, answering questions appropriately   Scheduled Meds: . sodium chloride   Intravenous Once  . apixaban  5 mg Oral  BID  . arformoterol  15 mcg Nebulization BID  . chlorhexidine gluconate (MEDLINE KIT)  15 mL Mouth Rinse BID  . Chlorhexidine Gluconate Cloth  6 each Topical Daily  . clonazePAM  1 mg Per Tube BID  . feeding supplement (JEVITY 1.2 CAL)  237 mL Per Tube 6 X Daily  . feeding supplement (PROSource TF)  45 mL Per Tube Daily  . fentaNYL  1 patch Transdermal Q72H  . free water  60 mL Per Tube 6 X Daily  . gabapentin  300 mg Per Tube TID  . insulin aspart  0-6 Units Subcutaneous Q4H  . mouth rinse  15 mL Mouth Rinse 10 times per day  . metoprolol tartrate  50 mg Per Tube BID  . pantoprazole (PROTONIX) IV  40 mg Intravenous QHS  . QUEtiapine  25 mg Per Tube QHS  . sodium chloride flush  10-40 mL Intracatheter Q12H  . sodium chloride flush  3 mL Intravenous Q12H   Continuous Infusions: . sodium chloride Stopped (06/25/20 1842)  . sodium chloride     PRN Meds:.sodium chloride, acetaminophen (TYLENOL) oral liquid 160 mg/5 mL, diazepam, ipratropium-albuterol, labetalol, metoprolol tartrate, ondansetron (ZOFRAN) IV, oxyCODONE, polyethylene glycol, sodium chloride flush, sodium chloride flush    ASSESSMENT AND PLAN   Acute on chronic hypoxemic and hypercapnic respiratory failure thought secondary to polypharmacy with inability to wean from ventilator due to muscular deconditioning now s/p tracheostomy.  Dysphagia, moderate protein calorie malnutrition- now s/p PEG  Severe baseline COPD- complicated trach wean  Muscular deconditioning  Chronic anxiety/depression  Metabolic encephalopathy/agitation- improving but remains on precedex gtt  - Wean off precedex, use clonidine taper as bridge -Continue fentanyl patch - Switch IV steroids to PO steroids x  4 more days then stop - Switched from standing duonebs from duonebs to brovana/yupelri - PT/OT up to chair - Continue standing clonazepam, use valium per tube for breakthrough - SLP for PMV trials - Work toward getting off IV meds so she can  be candidate for rehab vs. SNF vs. LTACH   Patient chronically critically ill due to respiratory failure, metabolic encephalopathy Interventions to address this today weaning precedex, weaning vent Risk of deterioration without these interventions is high   Multidisciplinary rounds were performed with the ICU team.  Patient would benefit from [facility]. *This note was dictated using voice recognition software/Dragon.  Despite best efforts to proofread, errors can occur which can change the meaning.  Any change was purely unintentional.     Ottie Glazier, M.D.  Pulmonary [facility] [place]

## 2020-06-30 DIAGNOSIS — J441 Chronic obstructive pulmonary disease with (acute) exacerbation: Secondary | ICD-10-CM | POA: Diagnosis not present

## 2020-06-30 DIAGNOSIS — J9622 Acute and chronic respiratory failure with hypercapnia: Secondary | ICD-10-CM | POA: Diagnosis not present

## 2020-06-30 DIAGNOSIS — J9621 Acute and chronic respiratory failure with hypoxia: Secondary | ICD-10-CM | POA: Diagnosis not present

## 2020-06-30 LAB — BASIC METABOLIC PANEL
Anion gap: 8 (ref 5–15)
BUN: 19 mg/dL (ref 8–23)
CO2: 40 mmol/L — ABNORMAL HIGH (ref 22–32)
Calcium: 9 mg/dL (ref 8.9–10.3)
Chloride: 93 mmol/L — ABNORMAL LOW (ref 98–111)
Creatinine, Ser: 0.48 mg/dL (ref 0.44–1.00)
GFR, Estimated: >60 mL/min (ref >60)
Glucose, Bld: 105 mg/dL — ABNORMAL HIGH (ref 70–99)
Potassium: 4.4 mmol/L (ref 3.5–5.1)
Sodium: 141 mmol/L (ref 135–145)

## 2020-06-30 LAB — CBC
HCT: 28.9 % — ABNORMAL LOW (ref 36.0–46.0)
Hemoglobin: 8.7 g/dL — ABNORMAL LOW (ref 12.0–15.0)
MCH: 29.7 pg (ref 26.0–34.0)
MCHC: 30.1 g/dL (ref 30.0–36.0)
MCV: 98.6 fL (ref 80.0–100.0)
Platelets: 369 10*3/uL (ref 150–400)
RBC: 2.93 MIL/uL — ABNORMAL LOW (ref 3.87–5.11)
RDW: 14.5 % (ref 11.5–15.5)
WBC: 7.3 10*3/uL (ref 4.0–10.5)
nRBC: 0 % (ref 0.0–0.2)

## 2020-06-30 LAB — GLUCOSE, CAPILLARY
Glucose-Capillary: 118 mg/dL — ABNORMAL HIGH (ref 70–99)
Glucose-Capillary: 107 mg/dL — ABNORMAL HIGH (ref 70–99)
Glucose-Capillary: 105 mg/dL — ABNORMAL HIGH (ref 70–99)
Glucose-Capillary: 93 mg/dL (ref 70–99)
Glucose-Capillary: 97 mg/dL (ref 70–99)
Glucose-Capillary: 81 mg/dL (ref 70–99)

## 2020-06-30 LAB — PHOSPHORUS: Phosphorus: 4.2 mg/dL (ref 2.5–4.6)

## 2020-06-30 LAB — MAGNESIUM: Magnesium: 2.1 mg/dL (ref 1.7–2.4)

## 2020-06-30 NOTE — TOC Progression Note (Addendum)
Transition of Care [facility]) - Progression Note    Patient Details  Name: ABYGAIL GALENO MRN: [id number] Date of Birth: [birth year]  Transition of Care [facility]) CM/SW Contact  Liliana Cline, LCSW Phone Number: 06/30/2020, 8:50 AM  Clinical Narrative:   Call from insurance company requesting Peer to Peer with MD before 12:30 pm today for insurance authorization, updated Dr. Myriam Forehand of request. Phone # [phone] option 5.  10:10- MD has not seen secure chat regarding peer to peer request. Paged MD.  1:10- Per MD, Peer to Peer denied. Spoke with [employer]. She is going to start an expedited appeal with insurance after speaking with patient and family.     Barriers to Discharge: Continued Medical Work up  Expected Discharge Plan and Services   In-house Referral: Clinical Social Work,Hospice / Palliative Care   Post Acute Care Choice: Durable Medical Equipment (Oxygen 4L) Living arrangements for the past 2 months: Apartment                                       Social Determinants of Health (SDOH) Interventions    Readmission Risk Interventions No flowsheet data found.

## 2020-06-30 NOTE — Progress Notes (Signed)
Progress Note    Virginia [name]  LDJ:[id number] DOB: February 14, 1951  DOA: 06/06/2020 PCP: Idelle Crouch, MD      Brief Narrative:    Medical records reviewed and are as summarized below: All normal Virginia [name] is a 70 y.o. female severe COPD admitted with acute on chronic hypoxic hypercapnic respiratory failure and acute encephalopathy secondary to narcotic use and AECOPD requiring mechanical intubation  Now s/p trach/PEG working on trach weaning.      Assessment/Plan:   Active Problems:   Respiratory failure (HCC)   COPD exacerbation (HCC)   Nutrition Problem: Increased nutrient needs Etiology: catabolic illness (COPD)  Signs/Symptoms: estimated needs   Body mass index is 22.86 kg/m.      Acute on chronic hypoxemic and hypercapnic respiratory failure  Failure to wean from ventilator so tracheostomy was done.  She is still on FiO2 of 35% on 8 L/min.  Taper down oxygen as able.    Dysphagia, moderate protein calorie malnutrition- now s/p PEG for enteral nutrition.  She is tolerating clear liquid diet.  Severe baseline COPD-continue bronchodilators.  She is off of steroids.  Muscular deconditioning: PT and OT recommend discharge to SNF  Chronic anxiety/depression: Continue psychotropics and benzodiazepines  Metabolic encephalopathy/agitation- s/p precedex gtt Mental status has improved - Weaned off precedex, use clonidine taper as bridge -Continue fentanyl patch   Paroxysmal A. Fib Continue Eliquis  I spoke to Dr. Madelin Rear with [place] health insurance for peer-to-peer review.  She said she cannot approve transfer to [facility] because patient has already been weaned off the ventilator.  Plan to discharge patient to SNF when oxygen requirements for SNF level of care is acceptable.  Of note, patient does not want to go to SNF.  She prefers to go home. She lives at home alone but she said she can stay with her family during her recovery  process.      Diet Order            Diet clear liquid Room service appropriate? Yes with Assist; Fluid consistency: Thin  Diet effective now                    Consultants:  Intensivist  ENT surgeon  Procedures:  Intubation and mechanical ventilation  Tracheostomy  PEG tube placement     Medications:   . sodium chloride   Intravenous Once  . apixaban  5 mg Oral BID  . arformoterol  15 mcg Nebulization BID  . chlorhexidine gluconate (MEDLINE KIT)  15 mL Mouth Rinse BID  . Chlorhexidine Gluconate Cloth  6 each Topical Daily  . clonazePAM  1 mg Per Tube BID  . feeding supplement (JEVITY 1.2 CAL)  237 mL Per Tube 6 X Daily  . feeding supplement (PROSource TF)  45 mL Per Tube Daily  . fentaNYL  1 patch Transdermal Q72H  . free water  60 mL Per Tube 6 X Daily  . gabapentin  300 mg Per Tube TID  . insulin aspart  0-6 Units Subcutaneous Q4H  . mouth rinse  15 mL Mouth Rinse 10 times per day  . metoprolol tartrate  50 mg Per Tube BID  . pantoprazole (PROTONIX) IV  40 mg Intravenous QHS  . QUEtiapine  25 mg Per Tube QHS  . sodium chloride flush  10-40 mL Intracatheter Q12H  . sodium chloride flush  3 mL Intravenous Q12H   Continuous Infusions: . sodium chloride Stopped (06/25/20 1842)  . sodium  chloride       Anti-infectives (From admission, onward)   Start     Dose/Rate Route Frequency Ordered Stop   06/21/20 1200  ceFAZolin (ANCEF) IVPB 2g/100 mL premix       Note to Pharmacy: Please have at bedside. Will be administered one hour prior to procedure. Endo staff will instruct on when to start infusion.   2 g 200 mL/hr over 30 Minutes Intravenous  Once 06/21/20 0820 06/21/20 2030   06/07/20 1400  azithromycin (ZITHROMAX) 500 mg in sodium chloride 0.9 % 250 mL IVPB  Status:  Discontinued        500 mg 250 mL/hr over 60 Minutes Intravenous Every 24 hours 06/06/20 2030 06/06/20 2044   06/07/20 1400  azithromycin (ZITHROMAX) 500 mg in sodium chloride 0.9 % 250  mL IVPB  Status:  Discontinued        500 mg 250 mL/hr over 60 Minutes Intravenous Every 24 hours 06/06/20 2044 06/08/20 1031   06/06/20 1415  cefTRIAXone (ROCEPHIN) 2 g in sodium chloride 0.9 % 100 mL IVPB        2 g 200 mL/hr over 30 Minutes Intravenous  Once 06/06/20 1407 06/06/20 1514   06/06/20 1415  azithromycin (ZITHROMAX) 500 mg in sodium chloride 0.9 % 250 mL IVPB        500 mg 250 mL/hr over 60 Minutes Intravenous  Once 06/06/20 1407 06/06/20 1708             Family Communication/Anticipated D/C date and plan/Code Status   DVT prophylaxis: SCDs Start: 06/06/20 1430 apixaban (ELIQUIS) tablet 5 mg     Code Status: Full Code  Family Communication: None Disposition Plan:    Status is: Inpatient  Remains inpatient appropriate because:Unsafe d/c plan   Dispo: The patient is from: Home              Anticipated d/c is to: SNF              Anticipated d/c date is: 3 days              Patient currently is not medically stable to d/c.           Subjective:   Interval events noted.  Fritz Pickerel, RN, over the bedside suctioning the airways during this encounter.  Objective:    Vitals:   06/30/20 0535 06/30/20 0700 06/30/20 0733 06/30/20 1114  BP: (!) 146/90  129/89 (!) 154/89  Pulse: 89  70 79  Resp:   18 17  Temp: 98.2 F (36.8 C)  98.1 F (36.7 C) 97.8 F (36.6 C)  TempSrc:      SpO2:  99% 94% 98%  Weight:      Height:       No data found.   Intake/Output Summary (Last 24 hours) at 06/30/2020 1141 Last data filed at 06/30/2020 1030 Gross per 24 hour  Intake 0 ml  Output 1800 ml  Net -1800 ml   Filed Weights   06/25/20 0500 06/27/20 0500 06/28/20 0438  Weight: 62 kg 57.8 kg 56.7 kg    Exam:   GEN: NAD SKIN: Warm and dry EYES: No pallor or icterus ENT: Tracheostomy CV: RRR PULM: CTA B ABD: soft, ND, NT, +BS, +PEG tube CNS: AAO x 3, non focal EXT: No edema or tenderness  GU: Foley catheter draining amber urine       Data  Reviewed:   I have personally reviewed following labs and imaging studies:  Labs: Labs  show the following:   Basic Metabolic Panel: Recent Labs  Lab 06/26/20 0430 06/27/20 0540 06/28/20 0500 06/29/20 0255 06/30/20 0941  NA 138 141 143 142 141  K 4.1 4.0 4.3 4.3 4.4  CL 95* 93* 95* 91* 93*  CO2 37* 41* 40* 41* 40*  GLUCOSE 115* 102* 105* 99 105*  BUN 15 15 15 20 19  CREATININE 0.59 0.57 0.53 0.56 0.48  CALCIUM 8.3* 8.9 9.5 9.0 9.0  MG 1.8 1.9 1.9 2.0 2.1  PHOS 4.5 5.6* 5.3* 5.2* 4.2   GFR Estimated Creatinine Clearance: 52.5 mL/min (by C-G formula based on SCr of 0.48 mg/dL). Liver Function Tests: No results for input(s): AST, ALT, ALKPHOS, BILITOT, PROT, ALBUMIN in the last 168 hours. No results for input(s): LIPASE, AMYLASE in the last 168 hours. No results for input(s): AMMONIA in the last 168 hours. Coagulation profile No results for input(s): INR, PROTIME in the last 168 hours.  CBC: Recent Labs  Lab 06/24/20 0356 06/25/20 0336 06/26/20 0430 06/27/20 0540 06/28/20 0500 06/29/20 0255 06/30/20 0941  WBC 8.9 7.5 7.6 6.7 7.9 7.7 7.3  NEUTROABS 7.4 5.8 5.5 4.6 5.3  --   --   HGB 7.6* 7.2* 7.2* 7.8* 8.4* 8.5* 8.7*  HCT 24.0* 24.1* 23.8* 26.0* 28.1* 28.8* 28.9*  MCV 96.0 99.6 98.8 98.5 98.6 99.7 98.6  PLT 145* 153 201 241 316 317 369   Cardiac Enzymes: No results for input(s): CKTOTAL, CKMB, CKMBINDEX, TROPONINI in the last 168 hours. BNP (last 3 results) No results for input(s): PROBNP in the last 8760 hours. CBG: Recent Labs  Lab 06/29/20 2103 06/29/20 2350 06/30/20 0551 06/30/20 0728 06/30/20 1139  GLUCAP 86 94 118* 107* 105*   D-Dimer: No results for input(s): DDIMER in the last 72 hours. Hgb A1c: No results for input(s): HGBA1C in the last 72 hours. Lipid Profile: No results for input(s): CHOL, HDL, LDLCALC, TRIG, CHOLHDL, LDLDIRECT in the last 72 hours. Thyroid function studies: No results for input(s): TSH, T4TOTAL, T3FREE, THYROIDAB in the  last 72 hours.  Invalid input(s): FREET3 Anemia work up: No results for input(s): VITAMINB12, FOLATE, FERRITIN, TIBC, IRON, RETICCTPCT in the last 72 hours. Sepsis Labs: Recent Labs  Lab 06/27/20 0540 06/28/20 0500 06/29/20 0255 06/30/20 0941  WBC 6.7 7.9 7.7 7.3    Microbiology No results found for this or any previous visit (from the past 240 hour(s)).  Procedures and diagnostic studies:  No results found.             LOS: 24 days      [facility]   Pager on [email or web address]. If 7PM-7AM, please contact night-coverage at [email or web address]     06/30/2020, 11:41 AM           

## 2020-06-30 NOTE — Progress Notes (Signed)
CRITICAL CARE PROGRESS NOTE    Name: Virginia [name] MRN: [id number] DOB: 06/12/1951     LOS: 65   SUBJECTIVE FINDINGS & SIGNIFICANT EVENTS    Patient description:  70 yo female with severe COPD admitted with acute on chronic hypoxic hypercapnic respiratory failure and acute encephalopathy secondary to narcotic use and AECOPD requiring mechanical intubation  Now s/p trach/PEG working on trach weaning.  On TC at present, anxious. Remains on precedex.  06/27/20- patient is improved.  Signed out to [facility] - Dr Jimmye Norman for pick up in am.   06/29/20- patient is with respiratory distress, she has high volume phlegm from trache, ive suctioned out whats possible via [place] and messaged RT to assist.  RN was able to come in and evaluate patient and she was able to communicate appropriately.   06/30/20- patient is in bed resting. On 8L/min.  Reviewed care plan with DR Mal Misty today. She is clinically better today then yesterday.   PHYSICAL EXAMINATION   Vital Signs: Temp:  [97.6 F (36.4 C)-100 F (37.8 C)] 97.8 F (36.6 C) (01/07 1114) Pulse Rate:  [70-106] 79 (01/07 1114) Resp:  [17-18] 17 (01/07 1114) BP: (129-159)/(83-96) 154/89 (01/07 1114) SpO2:  [94 %-100 %] 98 % (01/07 1114) FiO2 (%):  [35 %] 35 % (01/07 0700)   FiO2 (%):  [35 %] 35 %   Constitutional: anxious frail woman lying in bed  Eyes: eomi, pupils equal Ears, nose, mouth, and throat: trach in place, minmal secretions Cardiovascular: RRR,  Ext warm Respiratory: diminished with prolonged expiratory phase, occasional accessory muscle use Gastrointestinal: soft, abd wrapped after PEG, some mild tenderness at PEG insertion site MSK: muscle wasting Skin: No rashes, normal turgor, scattered bruising Neurologic: moves all 4 ext to command,  weak Psychiatric: anxious, answering questions appropriately   Scheduled Meds: . sodium chloride   Intravenous Once  . apixaban  5 mg Oral BID  . arformoterol  15 mcg Nebulization BID  . chlorhexidine gluconate (MEDLINE KIT)  15 mL Mouth Rinse BID  . Chlorhexidine Gluconate Cloth  6 each Topical Daily  . clonazePAM  1 mg Per Tube BID  . feeding supplement (JEVITY 1.2 CAL)  237 mL Per Tube 6 X Daily  . feeding supplement (PROSource TF)  45 mL Per Tube Daily  . fentaNYL  1 patch Transdermal Q72H  . free water  60 mL Per Tube 6 X Daily  . gabapentin  300 mg Per Tube TID  . insulin aspart  0-6 Units Subcutaneous Q4H  . mouth rinse  15 mL Mouth Rinse 10 times per day  . metoprolol tartrate  50 mg Per Tube BID  . pantoprazole (PROTONIX) IV  40 mg Intravenous QHS  . QUEtiapine  25 mg Per Tube QHS  . sodium chloride flush  10-40 mL Intracatheter Q12H  . sodium chloride flush  3 mL Intravenous Q12H   Continuous Infusions: . sodium chloride Stopped (06/25/20 1842)  . sodium chloride     PRN Meds:.sodium chloride, acetaminophen (TYLENOL) oral liquid 160 mg/5 mL, diazepam, ipratropium-albuterol, labetalol, metoprolol tartrate, ondansetron (ZOFRAN) IV, oxyCODONE, polyethylene glycol, sodium chloride flush, sodium chloride flush    ASSESSMENT AND PLAN   Acute on chronic hypoxemic and hypercapnic respiratory failure thought secondary to polypharmacy with inability to wean from ventilator due to muscular deconditioning now s/p tracheostomy.  Dysphagia, moderate protein calorie malnutrition- now s/p PEG  Severe baseline COPD- complicated trach wean  Muscular deconditioning  Chronic anxiety/depression  Metabolic encephalopathy/agitation- improving but  remains on precedex gtt  - repeat tracheal aspirate ordered - 1/6 and 06/30/20 - Switched from standing duonebs from duonebs to brovana/yupelri - PT/OT up to chair - Continue standing clonazepam, use valium per tube for breakthrough - SLP  for PMV trials - Work toward getting off IV meds so she can be candidate for rehab vs. SNF vs. LTACH   Patient chronically critically ill due to respiratory failure, metabolic encephalopathy Interventions to address this today weaning precedex, weaning vent Risk of deterioration without these interventions is high   Multidisciplinary rounds were performed with the ICU team.  Patient would benefit from [facility]. *This note was dictated using voice recognition software/Dragon.  Despite best efforts to proofread, errors can occur which can change the meaning.  Any change was purely unintentional.     Ottie Glazier, M.D.  [facility]

## 2020-07-01 DIAGNOSIS — J9622 Acute and chronic respiratory failure with hypercapnia: Secondary | ICD-10-CM | POA: Diagnosis not present

## 2020-07-01 DIAGNOSIS — J441 Chronic obstructive pulmonary disease with (acute) exacerbation: Secondary | ICD-10-CM | POA: Diagnosis not present

## 2020-07-01 DIAGNOSIS — J9621 Acute and chronic respiratory failure with hypoxia: Secondary | ICD-10-CM | POA: Diagnosis not present

## 2020-07-01 LAB — CBC
HCT: 27.8 % — ABNORMAL LOW (ref 36.0–46.0)
Hemoglobin: 8.5 g/dL — ABNORMAL LOW (ref 12.0–15.0)
MCH: 29.8 pg (ref 26.0–34.0)
MCHC: 30.6 g/dL (ref 30.0–36.0)
MCV: 97.5 fL (ref 80.0–100.0)
Platelets: 394 10*3/uL (ref 150–400)
RBC: 2.85 MIL/uL — ABNORMAL LOW (ref 3.87–5.11)
RDW: 14.6 % (ref 11.5–15.5)
WBC: 7.1 10*3/uL (ref 4.0–10.5)
nRBC: 0 % (ref 0.0–0.2)

## 2020-07-01 LAB — GLUCOSE, CAPILLARY
Glucose-Capillary: 104 mg/dL — ABNORMAL HIGH (ref 70–99)
Glucose-Capillary: 89 mg/dL (ref 70–99)
Glucose-Capillary: 90 mg/dL (ref 70–99)
Glucose-Capillary: 90 mg/dL (ref 70–99)
Glucose-Capillary: 95 mg/dL (ref 70–99)
Glucose-Capillary: 97 mg/dL (ref 70–99)

## 2020-07-01 LAB — BASIC METABOLIC PANEL
Anion gap: 9 (ref 5–15)
BUN: 20 mg/dL (ref 8–23)
CO2: 38 mmol/L — ABNORMAL HIGH (ref 22–32)
Calcium: 9 mg/dL (ref 8.9–10.3)
Chloride: 93 mmol/L — ABNORMAL LOW (ref 98–111)
Creatinine, Ser: 0.53 mg/dL (ref 0.44–1.00)
GFR, Estimated: >60 mL/min (ref >60)
Glucose, Bld: 101 mg/dL — ABNORMAL HIGH (ref 70–99)
Potassium: 4.2 mmol/L (ref 3.5–5.1)
Sodium: 140 mmol/L (ref 135–145)

## 2020-07-01 LAB — MAGNESIUM: Magnesium: 2.1 mg/dL (ref 1.7–2.4)

## 2020-07-01 LAB — PHOSPHORUS: Phosphorus: 4.1 mg/dL (ref 2.5–4.6)

## 2020-07-01 NOTE — Plan of Care (Signed)

## 2020-07-01 NOTE — Progress Notes (Signed)
Virginia [name] is a 70 y.o. female patient. 1. Acute respiratory failure with hypoxia and hypercapnia (HCC)   2. COPD exacerbation ([place])   3. Encounter for orogastric tube placement   4. Endotracheal tube present   5. Encounter for orogastric (OG) tube placement   6. Acute respiratory failure (HCC)    Presently awake, trach in place, functioning, c/o chronic diff breathing. Anxious some. No loud wheezing, no chest pain, leg pain or ectopy.    Past Medical History:  Diagnosis Date  . Anemia   . Anxiety   . Arthritis   . Atrial fibrillation ([place])   . C. difficile colitis 09/2015  . COPD (chronic obstructive pulmonary disease) ([place])   . Depression   . Dyspnea   . Dysrhythmia   . Fibromyalgia   . H/O tracheostomy   . Hep C w/ coma, chronic   . Hypertension   . MRSA pneumonia ([place]) 2017  . On home oxygen therapy    3 L/M   . Osteoporosis   . Peripheral neuropathy   . RLS (restless legs syndrome)   . S/P percutaneous endoscopic gastrostomy (PEG) tube placement ([place]) 09/2015  . Ventilator associated pneumonia [facility]) 10/2015   [facility] [place]   Current Facility-Administered Medications  Medication Dose Route Frequency Provider Last Rate Last Admin  . 0.9 %  sodium chloride infusion (Manually program via Guardrails IV Fluids)   Intravenous Once Rust[place]ster, Huel Cote, NP   Held at 06/18/20 [phone]  . 0.9 %  sodium chloride infusion  250 mL Intravenous Continuous Duffy Bruce, MD   Stopped at 06/25/20 1842  . 0.9 %  sodium chloride infusion  250 mL Intravenous PRN Flora Lipps, MD      . acetaminophen (TYLENOL) 160 MG/5ML solution 650 mg  650 mg Per Tube Q4H PRN Flora Lipps, MD      . apixaban (ELIQUIS) tablet 5 mg  5 mg Oral BID Val Riles, MD   5 mg at 07/01/20 0930  . arformoterol (BROVANA) nebulizer solution 15 mcg  15 mcg Nebulization BID Candee Furbish, MD   15 mcg at 07/01/20 0749  . chlorhexidine gluconate (MEDLINE KIT) (PERIDEX) 0.12 % solution 15 mL  15  mL Mouth Rinse BID Flora Lipps, MD   15 mL at 07/01/20 0928  . Chlorhexidine Gluconate Cloth 2 % PADS 6 each  6 each Topical Daily Flora Lipps, MD   6 each at 07/01/20 0926  . clonazePAM (KLONOPIN) tablet 1 mg  1 mg Per Tube BID Candee Furbish, MD   1 mg at 07/01/20 4174  . diazepam (VALIUM) tablet 5 mg  5 mg Per Tube Q6H PRN Awilda Bill, NP   5 mg at 06/30/20 2107  . feeding supplement (JEVITY 1.2 CAL) liquid 237 mL  237 mL Per Tube 6 X Daily Val Riles, MD   237 mL at 07/01/20 1446  . feeding supplement (PROSource TF) liquid 45 mL  45 mL Per Tube Daily Val Riles, MD   45 mL at 07/01/20 0931  . fentaNYL (DURAGESIC) 50 MCG/HR 1 patch  1 patch Transdermal Q72H Tyler Pita, MD   1 patch at 06/29/20 1229  . free water 60 mL  60 mL Per Tube 6 X Daily Val Riles, MD   60 mL at 07/01/20 1447  . gabapentin (NEURONTIN) capsule 300 mg  300 mg Per Tube TID Tyler Pita, MD   300 mg at 07/01/20 1644  . insulin aspart (novoLOG) injection 0-6  Units  0-6 Units Subcutaneous Q4H Dallie Piles, [facility]   1 Units at 06/24/20 1539  . ipratropium-albuterol (DUONEB) 0.5-2.5 (3) MG/3ML nebulizer solution 3 mL  3 mL Nebulization Q6H PRN Awilda Bill, NP   3 mL at 06/23/20 0833  . labetalol (NORMODYNE) injection 10 mg  10 mg Intravenous Q4H PRN Tyna Jaksch, MD   10 mg at 06/28/20 6010  . MEDLINE mouth rinse  15 mL Mouth Rinse 10 times per day Flora Lipps, MD   15 mL at 07/01/20 1447  . metoprolol tartrate (LOPRESSOR) injection 5 mg  5 mg Intravenous Q3H PRN Ottie Glazier, MD   5 mg at 06/17/20 2006  . metoprolol tartrate (LOPRESSOR) tablet 50 mg  50 mg Per Tube BID Candee Furbish, MD   50 mg at 07/01/20 0929  . ondansetron (ZOFRAN) injection 4 mg  4 mg Intravenous Q6H PRN Flora Lipps, MD      . oxyCODONE (Oxy IR/ROXICODONE) immediate release tablet 10 mg  10 mg Per Tube Q4H PRN Candee Furbish, MD   10 mg at 07/01/20 1625  . pantoprazole (PROTONIX) injection 40 mg  40 mg  Intravenous QHS Dallie Piles, RPH   40 mg at 06/30/20 2105  . polyethylene glycol (MIRALAX / GLYCOLAX) packet 17 g  17 g Per Tube Daily PRN Flora Lipps, MD      . QUEtiapine (SEROQUEL) tablet 25 mg  25 mg Per Tube QHS Tyler Pita, MD   25 mg at 06/30/20 2107  . sodium chloride flush (NS) 0.9 % injection 10-40 mL  10-40 mL Intracatheter Q12H Tyler Pita, MD   40 mL at 07/01/20 0930  . sodium chloride flush (NS) 0.9 % injection 10-40 mL  10-40 mL Intracatheter PRN Tyler Pita, MD   10 mL at 06/30/20 2104  . sodium chloride flush (NS) 0.9 % injection 3 mL  3 mL Intravenous Q12H Flora Lipps, MD   3 mL at 07/01/20 0930  . sodium chloride flush (NS) 0.9 % injection 3 mL  3 mL Intravenous PRN Flora Lipps, MD       No Known Allergies   Ros: No headaches, fever, sinus pressure, change in visios, no neck pain, or blood per trach. Short of breath, thick sputum. Able to suction self. No pleurisy, andina, ectopy, leg pain or advancing edema. No abdominal pain, peg tube present, no loose stools, no rashes, nodes, focal neuro complaints, anxious     Blood pressure (!) 145/84, pulse 90, temperature 99.5 F (37.5 C), resp. rate 16, height 5' 2.01" (1.575 m), weight 54.3 kg, SpO2 99 %. Sitting up in bed, following commands, good color Heent: [place], NT, EOMI, Non juandiced Neck supple, trach in place, clean Chest moving air, no rubs, wheezing, mild rhonci Cardiac: rrr, no loud rubs Abd: soft, non tender, site clean Ext. No homan's sign, no edema or calf pain Neuro, moving all extremities equally Psych following commands, anxious, reasured Results for Virginia, [name] (MRN [id number]) as of 07/01/2020 18:04  Ref. Range 07/01/2020 20:25  BASIC METABOLIC PANEL Unknown Rpt (A)  Sodium Latest Ref Range: 135 - 145 mmol/L 140  Potassium Latest Ref Range: 3.5 - 5.1 mmol/L 4.2  Chloride Latest Ref Range: 98 - 111 mmol/L 93 (L)  CO2 Latest Ref Range: 22 - 32 mmol/L 38 (H)  Glucose Latest Ref  Range: 70 - 99 mg/dL 101 (H)  BUN Latest Ref Range: 8 - 23 mg/dL 20  Creatinine Latest Ref  Range: 0.44 - 1.00 mg/dL 0.53  Calcium Latest Ref Range: 8.9 - 10.3 mg/dL 9.0  Anion gap Latest Ref Range: 5 - 15  9  Phosphorus Latest Ref Range: 2.5 - 4.6 mg/dL 4.1  Magnesium Latest Ref Range: 1.7 - 2.4 mg/dL 2.1  GFR, Estimated Latest Ref Range: >60 mL/min >60  WBC Latest Ref Range: 4.0 - 10.5 K/uL 7.1  RBC Latest Ref Range: 3.87 - 5.11 MIL/uL 2.85 (L)  Hemoglobin Latest Ref Range: 12.0 - 15.0 g/dL 8.5 (L)  HCT Latest Ref Range: 36.0 - 46.0 % 27.8 (L)  MCV Latest Ref Range: 80.0 - 100.0 fL 97.5  MCH Latest Ref Range: 26.0 - 34.0 pg 29.8  MCHC Latest Ref Range: 30.0 - 36.0 g/dL 30.6  RDW Latest Ref Range: 11.5 - 15.5 % 14.6  Platelets Latest Ref Range: 150 - 400 K/uL 394  nRBC Latest Ref Range: 0.0 - 0.2 % 0.0   Assessment & Plan  Acute on chronic hypoxemic and hypercapnic respiratory failure off ventilator,  s/p tracheostomy.respiratory status marginal, weak   Dysphagia, moderate protein calorie malnutrition- now s/p PEG  Severe baseline COPD- complicated trach wean  Muscular deconditioning  Chronic anxiety/depression  - continue duonebs from duonebs to brovana/duonebsi - PT/OT up to chair - Continue standing clonazepam, use valium per tube for breakthrough -avoid heavy sedation -Ltac placement in progress   Wallene Huh  MD 07/01/2020

## 2020-07-01 NOTE — Progress Notes (Signed)
Progress Note    Virginia [name]  WLN:[id number] DOB: December 15, 1950  DOA: 06/06/2020 PCP: Idelle Crouch, MD      Brief Narrative:    Medical records reviewed and are as summarized below: All normal Virginia [name] is a 70 y.o. female severe COPD admitted with acute on chronic hypoxic hypercapnic respiratory failure and acute encephalopathy secondary to narcotic use and AECOPD requiring mechanical intubation  Now s/p trach/PEG working on trach weaning.      Assessment/Plan:   Active Problems:   Respiratory failure (HCC)   COPD exacerbation (HCC)   Nutrition Problem: Increased nutrient needs Etiology: catabolic illness (COPD)  Signs/Symptoms: estimated needs   Body mass index is 21.89 kg/m.      Acute on chronic hypoxemic and hypercapnic respiratory failure  Failure to wean from ventilator so tracheostomy was done.  She is still on FiO2 of 35% on 8 L/min.  Continue attempts to taper down oxygen.  Dysphagia, moderate protein calorie malnutrition- now s/p PEG for enteral nutrition.  She is tolerating clear liquid diet.  Severe baseline COPD-continue bronchodilators.  She is off of steroids.  Muscular deconditioning: PT and OT recommend discharge to SNF  Chronic anxiety/depression: Continue psychotropics and benzodiazepines  Metabolic encephalopathy/agitation- s/p precedex gtt Mental status has improved - Weaned off precedex, use clonidine taper as bridge -Continue fentanyl patch   Paroxysmal A. Fib Continue Eliquis   Urinary retention: Resolved.  Foley catheter was removed from 06/30/2020  Peer to peer review was done with Dr. Bertram Gala health insurance) on 06/30/2020.  Discharge to LTAC was not approved.  Patient's family has launched an appeal. Plan to discharge patient to SNF when oxygen requirements for SNF level of care is acceptable.  Of note, patient does not want to go to SNF.  She prefers to go home. She lives at home  alone but she said she can stay with her family during her recovery process.      Diet Order            Diet clear liquid Room service appropriate? Yes with Assist; Fluid consistency: Thin  Diet effective now                    Consultants:  Intensivist  ENT surgeon  Procedures:  Intubation and mechanical ventilation  Tracheostomy  PEG tube placement     Medications:   . sodium chloride   Intravenous Once  . apixaban  5 mg Oral BID  . arformoterol  15 mcg Nebulization BID  . chlorhexidine gluconate (MEDLINE KIT)  15 mL Mouth Rinse BID  . Chlorhexidine Gluconate Cloth  6 each Topical Daily  . clonazePAM  1 mg Per Tube BID  . feeding supplement (JEVITY 1.2 CAL)  237 mL Per Tube 6 X Daily  . feeding supplement (PROSource TF)  45 mL Per Tube Daily  . fentaNYL  1 patch Transdermal Q72H  . free water  60 mL Per Tube 6 X Daily  . gabapentin  300 mg Per Tube TID  . insulin aspart  0-6 Units Subcutaneous Q4H  . mouth rinse  15 mL Mouth Rinse 10 times per day  . metoprolol tartrate  50 mg Per Tube BID  . pantoprazole (PROTONIX) IV  40 mg Intravenous QHS  . QUEtiapine  25 mg Per Tube QHS  . sodium chloride flush  10-40 mL Intracatheter Q12H  . sodium chloride flush  3 mL Intravenous Q12H   Continuous Infusions: .  sodium chloride Stopped (06/25/20 1842)  . sodium chloride       Anti-infectives (From admission, onward)   Start     Dose/Rate Route Frequency Ordered Stop   06/21/20 1200  ceFAZolin (ANCEF) IVPB 2g/100 mL premix       Note to Pharmacy: Please have at bedside. Will be administered one hour prior to procedure. Endo staff will instruct on when to start infusion.   2 g 200 mL/hr over 30 Minutes Intravenous  Once 06/21/20 0820 06/21/20 2030   06/07/20 1400  azithromycin (ZITHROMAX) 500 mg in sodium chloride 0.9 % 250 mL IVPB  Status:  Discontinued        500 mg 250 mL/hr over 60 Minutes Intravenous Every 24 hours 06/06/20 2030 06/06/20 2044   06/07/20  1400  azithromycin (ZITHROMAX) 500 mg in sodium chloride 0.9 % 250 mL IVPB  Status:  Discontinued        500 mg 250 mL/hr over 60 Minutes Intravenous Every 24 hours 06/06/20 2044 06/08/20 1031   06/06/20 1415  cefTRIAXone (ROCEPHIN) 2 g in sodium chloride 0.9 % 100 mL IVPB        2 g 200 mL/hr over 30 Minutes Intravenous  Once 06/06/20 1407 06/06/20 1514   06/06/20 1415  azithromycin (ZITHROMAX) 500 mg in sodium chloride 0.9 % 250 mL IVPB        500 mg 250 mL/hr over 60 Minutes Intravenous  Once 06/06/20 1407 06/06/20 1708             Family Communication/Anticipated D/C date and plan/Code Status   DVT prophylaxis: SCDs Start: 06/06/20 1430 apixaban (ELIQUIS) tablet 5 mg     Code Status: Full Code  Family Communication: None Disposition Plan:    Status is: Inpatient  Remains inpatient appropriate because:Unsafe d/c plan   Dispo: The patient is from: Home              Anticipated d/c is to: SNF              Anticipated d/c date is: 3 days              Patient currently is not medically stable to d/c.           Subjective:   Interval events noted.  No complaints.  She has been able to pass urine since removal of Foley catheter yesterday.  Objective:    Vitals:   07/01/20 0432 07/01/20 0500 07/01/20 0914 07/01/20 1120  BP: (!) 148/83  137/88 (!) 141/80  Pulse: 93  (!) 103 75  Resp: '17  17 15  ' Temp: 97.7 F (36.5 C)  98.7 F (37.1 C) 98.5 F (36.9 C)  TempSrc:   Oral Oral  SpO2: (!) 88%  98% 99%  Weight:  54.3 kg    Height:       No data found.   Intake/Output Summary (Last 24 hours) at 07/01/2020 1135 Last data filed at 07/01/2020 1100 Gross per 24 hour  Intake 480 ml  Output 650 ml  Net -170 ml   Filed Weights   06/27/20 0500 06/28/20 0438 07/01/20 0500  Weight: 57.8 kg 56.7 kg 54.3 kg    Exam:   GEN: NAD SKIN: Warm and dry EYES: No pallor or icterus ENT: MMM, tracheostomy CV: RRR PULM: CTA B ABD: soft, ND, NT, +BS, +PEG  tube CNS: AAO x 3, non focal EXT: No edema or tenderness        Data Reviewed:   I  have personally reviewed following labs and imaging studies:  Labs: Labs show the following:   Basic Metabolic Panel: Recent Labs  Lab 06/27/20 0540 06/28/20 0500 06/29/20 0255 06/30/20 0941 07/01/20 0510  NA 141 143 142 141 140  K 4.0 4.3 4.3 4.4 4.2  CL 93* 95* 91* 93* 93*  CO2 41* 40* 41* 40* 38*  GLUCOSE 102* 105* 99 105* 101*  BUN '15 15 20 19 20  ' CREATININE 0.57 0.53 0.56 0.48 0.53  CALCIUM 8.9 9.5 9.0 9.0 9.0  MG 1.9 1.9 2.0 2.1 2.1  PHOS 5.6* 5.3* 5.2* 4.2 4.1   GFR Estimated Creatinine Clearance: 52.5 mL/min (by C-G formula based on SCr of 0.53 mg/dL). Liver Function Tests: No results for input(s): AST, ALT, ALKPHOS, BILITOT, PROT, ALBUMIN in the last 168 hours. No results for input(s): LIPASE, AMYLASE in the last 168 hours. No results for input(s): AMMONIA in the last 168 hours. Coagulation profile No results for input(s): INR, PROTIME in the last 168 hours.  CBC: Recent Labs  Lab 06/25/20 0336 06/26/20 0430 06/27/20 0540 06/28/20 0500 06/29/20 0255 06/30/20 0941 07/01/20 0510  WBC 7.5 7.6 6.7 7.9 7.7 7.3 7.1  NEUTROABS 5.8 5.5 4.6 5.3  --   --   --   HGB 7.2* 7.2* 7.8* 8.4* 8.5* 8.7* 8.5*  HCT 24.1* 23.8* 26.0* 28.1* 28.8* 28.9* 27.8*  MCV 99.6 98.8 98.5 98.6 99.7 98.6 97.5  PLT 153 201 241 316 317 369 394   Cardiac Enzymes: No results for input(s): CKTOTAL, CKMB, CKMBINDEX, TROPONINI in the last 168 hours. BNP (last 3 results) No results for input(s): PROBNP in the last 8760 hours. CBG: Recent Labs  Lab 06/30/20 1638 06/30/20 1737 06/30/20 2046 06/30/20 2352 07/01/20 0430  GLUCAP 93 97 81 90 104*   D-Dimer: No results for input(s): DDIMER in the last 72 hours. Hgb A1c: No results for input(s): HGBA1C in the last 72 hours. Lipid Profile: No results for input(s): CHOL, HDL, LDLCALC, TRIG, CHOLHDL, LDLDIRECT in the last 72 hours. Thyroid function  studies: No results for input(s): TSH, T4TOTAL, T3FREE, THYROIDAB in the last 72 hours.  Invalid input(s): FREET3 Anemia work up: No results for input(s): VITAMINB12, FOLATE, FERRITIN, TIBC, IRON, RETICCTPCT in the last 72 hours. Sepsis Labs: Recent Labs  Lab 06/28/20 0500 06/29/20 0255 06/30/20 0941 07/01/20 0510  WBC 7.9 7.7 7.3 7.1    Microbiology No results found for this or any previous visit (from the past 240 hour(s)).  Procedures and diagnostic studies:  No results found.             LOS: 25 days   [employer], advertising on [email or web address]. If 7PM-7AM, please contact night-coverage at [email or web address]     07/01/2020, 11:35 AM

## 2020-07-02 DIAGNOSIS — J9621 Acute and chronic respiratory failure with hypoxia: Secondary | ICD-10-CM | POA: Diagnosis not present

## 2020-07-02 DIAGNOSIS — J441 Chronic obstructive pulmonary disease with (acute) exacerbation: Secondary | ICD-10-CM | POA: Diagnosis not present

## 2020-07-02 DIAGNOSIS — J9622 Acute and chronic respiratory failure with hypercapnia: Secondary | ICD-10-CM | POA: Diagnosis not present

## 2020-07-02 LAB — GLUCOSE, CAPILLARY
Glucose-Capillary: 128 mg/dL — ABNORMAL HIGH (ref 70–99)
Glucose-Capillary: 88 mg/dL (ref 70–99)
Glucose-Capillary: 98 mg/dL (ref 70–99)
Glucose-Capillary: 90 mg/dL (ref 70–99)
Glucose-Capillary: 90 mg/dL (ref 70–99)

## 2020-07-02 LAB — CBC
HCT: 25.8 % — ABNORMAL LOW (ref 36.0–46.0)
Hemoglobin: 7.7 g/dL — ABNORMAL LOW (ref 12.0–15.0)
MCH: 29.1 pg (ref 26.0–34.0)
MCHC: 29.8 g/dL — ABNORMAL LOW (ref 30.0–36.0)
MCV: 97.4 fL (ref 80.0–100.0)
Platelets: 376 10*3/uL (ref 150–400)
RBC: 2.65 MIL/uL — ABNORMAL LOW (ref 3.87–5.11)
RDW: 14.4 % (ref 11.5–15.5)
WBC: 7.1 10*3/uL (ref 4.0–10.5)
nRBC: 0 % (ref 0.0–0.2)

## 2020-07-02 LAB — MAGNESIUM: Magnesium: 2 mg/dL (ref 1.7–2.4)

## 2020-07-02 LAB — PHOSPHORUS: Phosphorus: 4.7 mg/dL — ABNORMAL HIGH (ref 2.5–4.6)

## 2020-07-02 MED ORDER — GLYCOPYRROLATE 1 MG PO TABS
1.0000 mg | ORAL_TABLET | Freq: Two times a day (BID) | ORAL | Status: DC
  Administered 2020-07-02 – 2020-07-19 (×35): 1 mg
  Filled 2020-07-02 (×37): qty 1

## 2020-07-02 NOTE — Progress Notes (Signed)
CRITICAL CARE PROGRESS NOTE    Name: Virginia [name] MRN: [id number] DOB: [birth year]     LOS: 60   SUBJECTIVE FINDINGS & SIGNIFICANT EVENTS    Patient description:  70 yo female with severe COPD admitted with acute on chronic hypoxic hypercapnic respiratory failure and acute encephalopathy secondary to narcotic use and AECOPD requiring mechanical intubation  Now s/p trach/PEG working on trach weaning.  On TC at present, anxious. Remains on precedex.  06/27/20- patient is improved.  Signed out to [facility] - Dr Jimmye Norman for pick up in am.   06/29/20- patient is with respiratory distress, she has high volume phlegm from trache, ive suctioned out whats possible via [place] and messaged RT to assist.  RN was able to come in and evaluate patient and she was able to communicate appropriately.   06/30/20- patient is in bed resting. On 8L/min.  Reviewed care plan with DR Mal Misty today. She is clinically better today then yesterday.   07/02/20- patient is awake and alert. She is still on 8L/min.  Tracheal secretions are moderate and thick. Clinically shes stable.  CBC with down trend of Hb from 8.5 to 7.7.  No leukocytosis.  BMP is unremarkable.  Ive ordered tracheas aspirate 3 times still it has not been done. Will order procalcitonin today.  PEG working without issues.  FLuild balance -12L.   PHYSICAL EXAMINATION   Vital Signs: Temp:  [98 F (36.7 C)-99.5 F (37.5 C)] 98.5 F (36.9 C) (01/09 0755) Pulse Rate:  [75-111] 100 (01/09 0755) Resp:  [15-17] 16 (01/09 0755) BP: (108-165)/(72-98) 108/72 (01/09 0755) SpO2:  [92 %-99 %] 92 % (01/09 0755) FiO2 (%):  [35 %] 35 % (01/09 0754) Weight:  [55 kg] 55 kg (01/09 0620)   FiO2 (%):  [35 %] 35 %   Constitutional: anxious frail woman lying in bed  Eyes: eomi, pupils  equal Ears, nose, mouth, and throat: trach in place, minmal secretions Cardiovascular: RRR,  Ext warm Respiratory: diminished with prolonged expiratory phase, occasional accessory muscle use Gastrointestinal: soft, abd wrapped after PEG, some mild tenderness at PEG insertion site MSK: muscle wasting Skin: No rashes, normal turgor, scattered bruising Neurologic: moves all 4 ext to command, weak Psychiatric: anxious, answering questions appropriately   Scheduled Meds: . sodium chloride   Intravenous Once  . apixaban  5 mg Oral BID  . arformoterol  15 mcg Nebulization BID  . chlorhexidine gluconate (MEDLINE KIT)  15 mL Mouth Rinse BID  . Chlorhexidine Gluconate Cloth  6 each Topical Daily  . clonazePAM  1 mg Per Tube BID  . feeding supplement (JEVITY 1.2 CAL)  237 mL Per Tube 6 X Daily  . feeding supplement (PROSource TF)  45 mL Per Tube Daily  . fentaNYL  1 patch Transdermal Q72H  . free water  60 mL Per Tube 6 X Daily  . gabapentin  300 mg Per Tube TID  . insulin aspart  0-6 Units Subcutaneous Q4H  . mouth rinse  15 mL Mouth Rinse 10 times per day  . metoprolol tartrate  50 mg Per Tube BID  . pantoprazole (PROTONIX) IV  40 mg Intravenous QHS  . QUEtiapine  25 mg Per Tube QHS  . sodium chloride flush  10-40 mL Intracatheter Q12H  . sodium chloride flush  3 mL Intravenous Q12H   Continuous Infusions: . sodium chloride Stopped (06/25/20 1842)  . sodium chloride     PRN Meds:.sodium chloride, acetaminophen (TYLENOL) oral liquid 160 mg/5 mL, diazepam, ipratropium-albuterol,  labetalol, metoprolol tartrate, ondansetron (ZOFRAN) IV, oxyCODONE, polyethylene glycol, sodium chloride flush, sodium chloride flush    ASSESSMENT AND PLAN   Acute on chronic hypoxemic and hypercapnic respiratory failure thought secondary to polypharmacy with inability to wean from ventilator due to muscular deconditioning now s/p tracheostomy.  Dysphagia, moderate protein calorie malnutrition- now s/p  PEG  Severe baseline COPD- complicated trach wean  Muscular deconditioning  Chronic anxiety/depression  Metabolic encephalopathy/agitation- improving but remains on precedex gtt  - repeat tracheal aspirate ordered - 1/6 and 06/30/20 - Switched from standing duonebs from duonebs to brovana/yupelri - PT/OT up to chair - Continue standing clonazepam, use valium per tube for breakthrough - SLP for PMV trials - Work toward getting off IV meds so she can be candidate for rehab vs. SNF vs. LTACH -07/02/20- holding off on steroids, adding gycopyrrolate 68m BID per tube for tracheostomy phelgm.   Patient chronically critically ill due to respiratory failure, metabolic encephalopathy   *This note was dictated using voice recognition software/Dragon.  Despite best efforts to proofread, errors can occur which can change the meaning.  Any change was purely unintentional.     FOttie Glazier M.D.  [facility] [place]

## 2020-07-02 NOTE — Progress Notes (Signed)
   Progress Note    Virginia [name]  MRN:[id number] DOB: 04/05/1951  DOA: 06/06/2020 PCP: Sparks, Jeffrey D, MD      Brief Narrative:    Medical records reviewed and are as summarized below: All normal Virginia [name] is a 69 y.o. female severe COPD admitted with acute on chronic hypoxic hypercapnic respiratory failure and acute encephalopathy secondary to narcotic use and AECOPD requiring mechanical intubation  Now s/p trach/PEG working on trach weaning.      Assessment/Plan:   Active Problems:   Respiratory failure (HCC)   COPD exacerbation (HCC)   Nutrition Problem: Increased nutrient needs Etiology: catabolic illness (COPD)  Signs/Symptoms: estimated needs   Body mass index is 22.17 kg/m.     Acute on chronic hypoxemic and hypercapnic respiratory failure  Failure to wean from ventilator so tracheostomy was done.  She still has same oxygen requirements (FiO2 of 35% at 8 L/min).  Taper down oxygen as able.  Dysphagia, moderate protein calorie malnutrition- now s/p PEG for enteral nutrition.  She is tolerating clear liquid diet.  Severe baseline COPD-continue bronchodilators.  She is off of steroids.  Muscular deconditioning: PT and OT recommend discharge to SNF  Chronic anxiety/depression: Continue psychotropics and benzodiazepines  Metabolic encephalopathy/agitation- s/p precedex gtt Mental status has improved - Weaned off precedex, use clonidine taper as bridge -Continue fentanyl patch   Paroxysmal A. Fib Continue Eliquis   Urinary retention: Resolved.  Foley catheter was removed from 06/30/2020  Peer to peer review was done with Dr. Brocco(with [employer] insurance) on 06/30/2020.  Discharge to LTAC was not approved.  Patient's family has launched an appeal. Plan to discharge patient to SNF when oxygen requirements for SNF level of care is acceptable.  Of note, patient does not want to go to SNF.  She prefers to go home. She lives at  home alone but she said she can stay with her family during her recovery process.      Diet Order            Diet clear liquid Room service appropriate? Yes with Assist; Fluid consistency: Thin  Diet effective now                    Consultants:  Intensivist  ENT surgeon  Procedures:  Intubation and mechanical ventilation  Tracheostomy  PEG tube placement     Medications:   . sodium chloride   Intravenous Once  . apixaban  5 mg Oral BID  . arformoterol  15 mcg Nebulization BID  . chlorhexidine gluconate (MEDLINE KIT)  15 mL Mouth Rinse BID  . Chlorhexidine Gluconate Cloth  6 each Topical Daily  . clonazePAM  1 mg Per Tube BID  . feeding supplement (JEVITY 1.2 CAL)  237 mL Per Tube 6 X Daily  . feeding supplement (PROSource TF)  45 mL Per Tube Daily  . fentaNYL  1 patch Transdermal Q72H  . free water  60 mL Per Tube 6 X Daily  . gabapentin  300 mg Per Tube TID  . glycopyrrolate  1 mg Per Tube BID  . insulin aspart  0-6 Units Subcutaneous Q4H  . mouth rinse  15 mL Mouth Rinse 10 times per day  . metoprolol tartrate  50 mg Per Tube BID  . pantoprazole (PROTONIX) IV  40 mg Intravenous QHS  . QUEtiapine  25 mg Per Tube QHS  . sodium chloride flush  10-40 mL Intracatheter Q12H  . sodium chloride flush    3 mL Intravenous Q12H   Continuous Infusions: . sodium chloride Stopped (06/25/20 1842)  . sodium chloride       Anti-infectives (From admission, onward)   Start     Dose/Rate Route Frequency Ordered Stop   06/21/20 1200  ceFAZolin (ANCEF) IVPB 2g/100 mL premix       Note to Pharmacy: Please have at bedside. Will be administered one hour prior to procedure. Endo staff will instruct on when to start infusion.   2 g 200 mL/hr over 30 Minutes Intravenous  Once 06/21/20 0820 06/21/20 2030   06/07/20 1400  azithromycin (ZITHROMAX) 500 mg in sodium chloride 0.9 % 250 mL IVPB  Status:  Discontinued        500 mg 250 mL/hr over 60 Minutes Intravenous Every 24  hours 06/06/20 2030 06/06/20 2044   06/07/20 1400  azithromycin (ZITHROMAX) 500 mg in sodium chloride 0.9 % 250 mL IVPB  Status:  Discontinued        500 mg 250 mL/hr over 60 Minutes Intravenous Every 24 hours 06/06/20 2044 06/08/20 1031   06/06/20 1415  cefTRIAXone (ROCEPHIN) 2 g in sodium chloride 0.9 % 100 mL IVPB        2 g 200 mL/hr over 30 Minutes Intravenous  Once 06/06/20 1407 06/06/20 1514   06/06/20 1415  azithromycin (ZITHROMAX) 500 mg in sodium chloride 0.9 % 250 mL IVPB        500 mg 250 mL/hr over 60 Minutes Intravenous  Once 06/06/20 1407 06/06/20 1708             Family Communication/Anticipated D/C date and plan/Code Status   DVT prophylaxis: SCDs Start: 06/06/20 1430 apixaban (ELIQUIS) tablet 5 mg     Code Status: Full Code  Family Communication: None Disposition Plan:    Status is: Inpatient  Remains inpatient appropriate because:Unsafe d/c plan   Dispo: The patient is from: Home              Anticipated d/c is to: SNF              Anticipated d/c date is: 3 days              Patient currently is not medically stable to d/c.           Subjective:   Interval events noted.  She has no complaints.  Objective:    Vitals:   07/02/20 0454 07/02/20 0620 07/02/20 0754 07/02/20 0755  BP: 113/77   108/72  Pulse: 92  (!) 111 100  Resp: 16  16 16  Temp: 98 F (36.7 C)   98.5 F (36.9 C)  TempSrc:    Oral  SpO2: 97%  95% 92%  Weight:  55 kg    Height:       No data found.   Intake/Output Summary (Last 24 hours) at 07/02/2020 1129 Last data filed at 07/01/2020 1705 Gross per 24 hour  Intake --  Output 102 ml  Net -102 ml   Filed Weights   06/28/20 0438 07/01/20 0500 07/02/20 0620  Weight: 56.7 kg 54.3 kg 55 kg    Exam:   GEN: NAD SKIN: Warm and dry EYES: EOMI ENT: Tracheostomy CV: RRR PULM: CTA B ABD: soft, ND, NT, +BS, +PEG tube CNS: AAO x 3, non focal EXT: No edema or tenderness        Data Reviewed:   I have  personally reviewed following labs and imaging studies:  Labs: Labs show the following:     Basic Metabolic Panel: Recent Labs  Lab 06/27/20 0540 06/28/20 0500 06/29/20 0255 06/30/20 0941 07/01/20 0510 07/02/20 0441  NA 141 143 142 141 140  --   K 4.0 4.3 4.3 4.4 4.2  --   CL 93* 95* 91* 93* 93*  --   CO2 41* 40* 41* 40* 38*  --   GLUCOSE 102* 105* 99 105* 101*  --   BUN 15 15 20 19 20  --   CREATININE 0.57 0.53 0.56 0.48 0.53  --   CALCIUM 8.9 9.5 9.0 9.0 9.0  --   MG 1.9 1.9 2.0 2.1 2.1 2.0  PHOS 5.6* 5.3* 5.2* 4.2 4.1 4.7*   GFR Estimated Creatinine Clearance: 52.5 mL/min (by C-G formula based on SCr of 0.53 mg/dL). Liver Function Tests: No results for input(s): AST, ALT, ALKPHOS, BILITOT, PROT, ALBUMIN in the last 168 hours. No results for input(s): LIPASE, AMYLASE in the last 168 hours. No results for input(s): AMMONIA in the last 168 hours. Coagulation profile No results for input(s): INR, PROTIME in the last 168 hours.  CBC: Recent Labs  Lab 06/26/20 0430 06/27/20 0540 06/28/20 0500 06/29/20 0255 06/30/20 0941 07/01/20 0510 07/02/20 0441  WBC 7.6 6.7 7.9 7.7 7.3 7.1 7.1  NEUTROABS 5.5 4.6 5.3  --   --   --   --   HGB 7.2* 7.8* 8.4* 8.5* 8.7* 8.5* 7.7*  HCT 23.8* 26.0* 28.1* 28.8* 28.9* 27.8* 25.8*  MCV 98.8 98.5 98.6 99.7 98.6 97.5 97.4  PLT 201 241 316 317 369 394 376   Cardiac Enzymes: No results for input(s): CKTOTAL, CKMB, CKMBINDEX, TROPONINI in the last 168 hours. BNP (last 3 results) No results for input(s): PROBNP in the last 8760 hours. CBG: Recent Labs  Lab 07/01/20 1740 07/01/20 2056 07/02/20 0103 07/02/20 0429 07/02/20 0756  GLUCAP 95 97 128* 88 98   D-Dimer: No results for input(s): DDIMER in the last 72 hours. Hgb A1c: No results for input(s): HGBA1C in the last 72 hours. Lipid Profile: No results for input(s): CHOL, HDL, LDLCALC, TRIG, CHOLHDL, LDLDIRECT in the last 72 hours. Thyroid function studies: No results for input(s):  TSH, T4TOTAL, T3FREE, THYROIDAB in the last 72 hours.  Invalid input(s): FREET3 Anemia work up: No results for input(s): VITAMINB12, FOLATE, FERRITIN, TIBC, IRON, RETICCTPCT in the last 72 hours. Sepsis Labs: Recent Labs  Lab 06/29/20 0255 06/30/20 0941 07/01/20 0510 07/02/20 0441  WBC 7.7 7.3 7.1 7.1    Microbiology No results found for this or any previous visit (from the past 240 hour(s)).  Procedures and diagnostic studies:  No results found.             LOS: 26 days      [facility]   Pager on [email or web address]. If 7PM-7AM, please contact night-coverage at [email or web address]     07/02/2020, 11:29 AM            

## 2020-07-02 NOTE — Progress Notes (Signed)
Pt c/o bloating and nausea related to Jevity feeds. Pt refused AM feeds and was agreeable only to smaller portions of afternoon feeds. MD aware, no new orders. Pt requests that all feeds are of smaller volume or frequency.

## 2020-07-03 DIAGNOSIS — J9621 Acute and chronic respiratory failure with hypoxia: Secondary | ICD-10-CM | POA: Diagnosis not present

## 2020-07-03 DIAGNOSIS — J9622 Acute and chronic respiratory failure with hypercapnia: Secondary | ICD-10-CM | POA: Diagnosis not present

## 2020-07-03 DIAGNOSIS — J441 Chronic obstructive pulmonary disease with (acute) exacerbation: Secondary | ICD-10-CM | POA: Diagnosis not present

## 2020-07-03 LAB — GLUCOSE, CAPILLARY
Glucose-Capillary: 110 mg/dL — ABNORMAL HIGH (ref 70–99)
Glucose-Capillary: 75 mg/dL (ref 70–99)
Glucose-Capillary: 77 mg/dL (ref 70–99)
Glucose-Capillary: 84 mg/dL (ref 70–99)
Glucose-Capillary: 89 mg/dL (ref 70–99)
Glucose-Capillary: 96 mg/dL (ref 70–99)
Glucose-Capillary: 99 mg/dL (ref 70–99)

## 2020-07-03 LAB — CBC
HCT: 26.5 % — ABNORMAL LOW (ref 36.0–46.0)
Hemoglobin: 8.1 g/dL — ABNORMAL LOW (ref 12.0–15.0)
MCH: 29.9 pg (ref 26.0–34.0)
MCHC: 30.6 g/dL (ref 30.0–36.0)
MCV: 97.8 fL (ref 80.0–100.0)
Platelets: 361 10*3/uL (ref 150–400)
RBC: 2.71 MIL/uL — ABNORMAL LOW (ref 3.87–5.11)
RDW: 14.8 % (ref 11.5–15.5)
WBC: 6.5 10*3/uL (ref 4.0–10.5)
nRBC: 0 % (ref 0.0–0.2)

## 2020-07-03 LAB — MAGNESIUM: Magnesium: 2 mg/dL (ref 1.7–2.4)

## 2020-07-03 LAB — PHOSPHORUS: Phosphorus: 4.4 mg/dL (ref 2.5–4.6)

## 2020-07-03 NOTE — TOC Progression Note (Addendum)
Transition of Care [facility]) - Progression Note    Patient Details  Name: ZAIYAH SOTTILE MRN: [id number] Date of Birth: 30-Jan-1951  [facility] of [facility]) CM/SW Contact  Liliana Cline, LCSW Phone Number: 07/03/2020, 10:32 AM  Clinical Narrative:   Reached out to [employer] to inquire about appeal status, per Lurena Joiner still waiting for update.      Barriers to Discharge: Continued Medical Work up  Expected Discharge Plan and Services   In-house Referral: Clinical Social Work,Hospice / Palliative Care   Post Acute Care Choice: Durable Medical Equipment (Oxygen 4L) Living arrangements for the past 2 months: Apartment                                       Social Determinants of Health (SDOH) Interventions    Readmission Risk Interventions No flowsheet data found.

## 2020-07-03 NOTE — Progress Notes (Signed)
Physical Therapy Treatment Patient Details Name: Virginia [name] MRN: [id number] DOB: 12/03/1950 Today's Date: 07/03/2020    History of Present Illness 70 yo female with severe COPD admitted with acute on chronic hypoxic hypercapnic respiratory failure and acute encephalopathy secondary to narcotic use and AECOPD requiring mechanical intubation. Initially intubated from 12/14-12/16 and then again from 12/18-12/23. Now with + trach collar. Recent hospitalization from 12/10-12/12/21.    PT Comments    Pt ready for session.  To EOB with min a x 1.  She is able to stand with mod a x 1 to walker and reposition feet.  AFter about 1 minute she began coughing and needed to sit.  While at EOB coughing sats low 70's.  Unable to clear mucus and she was assisted to supine and RN called.  Suctioned and increased O2 needed but she was able to return to low 90's with care.     Follow Up Recommendations  SNF     Equipment Recommendations   (TBD)    Recommendations for Other Services       Precautions / Restrictions Precautions Precautions: Fall Restrictions Weight Bearing Restrictions: No    Mobility  Bed Mobility Overal bed mobility: Needs Assistance Bed Mobility: Supine to Sit;Sit to Supine     Supine to sit: Min guard Sit to supine: Mod assist      Transfers Overall transfer level: Needs assistance Equipment used: Rolling walker (2 wheeled) Transfers: Sit to/from Stand Sit to Stand: Mod assist            Ambulation/Gait                 Stairs             Wheelchair Mobility    Modified Rankin (Stroke Patients Only)       Balance Overall balance assessment: Needs assistance Sitting-balance support: Single extremity supported;Feet supported Sitting balance-Leahy Scale: Fair     Standing balance support: Bilateral upper extremity supported Standing balance-Leahy Scale: Poor                              Cognition Arousal/Alertness:  Awake/alert Behavior During Therapy: WFL for tasks assessed/performed Overall Cognitive Status: Within Functional Limits for tasks assessed                                        Exercises      General Comments        Pertinent Vitals/Pain Pain Assessment: No/denies pain    Home Living                      Prior Function            PT Goals (current goals can now be found in the care plan section) Progress towards PT goals: Progressing toward goals    Frequency    Min 2X/week      PT Plan Current plan remains appropriate    Co-evaluation              AM-PAC PT "6 Clicks" Mobility   Outcome Measure  Help needed turning from your back to your side while in a flat bed without using bedrails?: A Lot Help needed moving from lying on your back to sitting on the side of a flat bed without using bedrails?: A Lot  Help needed moving to and from a bed to a chair (including a wheelchair)?: A Lot Help needed standing up from a chair using your arms (e.g., wheelchair or bedside chair)?: A Lot Help needed to walk in hospital room?: Total Help needed climbing 3-5 steps with a railing? : Total 6 Click Score: 10    End of Session Equipment Utilized During Treatment: Gait belt;Oxygen Activity Tolerance: Patient tolerated treatment well Patient left: in chair;with chair alarm set Nurse Communication: Mobility status PT Visit Diagnosis: Muscle weakness (generalized) (M62.81);Difficulty in walking, not elsewhere classified (R26.2)     Time: 4982-6415 PT Time Calculation (min) (ACUTE ONLY): 24 min  Charges:  $Therapeutic Activity: 23-37 mins                    Danielle Dess, PTA 07/03/20, 9:53 AM

## 2020-07-03 NOTE — Care Management Important Message (Signed)
Important Message  Patient Details  Name: Virginia [name] MRN: [id number] Date of Birth: 02/03/1951   Medicare Important Message Given:  Yes     Johnell Comings 07/03/2020, 11:14 AM

## 2020-07-03 NOTE — Progress Notes (Signed)
1. Acute respiratory failure with hypoxia and hypercapnia (HCC)   2. COPD exacerbation ([place])   3. Encounter for orogastric tube placement   4. Endotracheal tube present   5. Encounter for orogastric (OG) tube placement   6. Acute respiratory failure (HCC)    Presently awake, trach in place, functioning, c/o chronic diff breathing. Less Anxious  No loud wheezing, no chest pain, leg pain or ectopy. she feels stronger, more optimistic. No new complaints, ware it will some time to get back to baseline       Past Medical History:  Diagnosis Date  . Anemia   . Anxiety   . Arthritis   . Atrial fibrillation ([place])   . C. difficile colitis 09/2015  . COPD (chronic obstructive pulmonary disease) ([place])   . Depression   . Dyspnea   . Dysrhythmia   . Fibromyalgia   . H/O tracheostomy   . Hep C w/ coma, chronic   . Hypertension   . MRSA pneumonia ([place]) 2017  . On home oxygen therapy    3 L/M   . Osteoporosis   . Peripheral neuropathy   . RLS (restless legs syndrome)   . S/P percutaneous endoscopic gastrostomy (PEG) tube placement ([place]) 09/2015  . Ventilator associated pneumonia [facility]) 10/2015   [facility], [place]            Current Facility-Administered Medications  Medication Dose Route Frequency Provider Last Rate Last Admin  . 0.9 %  sodium chloride infusion (Manually program via Guardrails IV Fluids)   Intravenous Once Rust-Chester, Huel Cote, NP   Held at 06/18/20 [phone]  . 0.9 %  sodium chloride infusion  250 mL Intravenous Continuous Duffy Bruce, MD   Stopped at 06/25/20 1842  . 0.9 %  sodium chloride infusion  250 mL Intravenous PRN Flora Lipps, MD      . acetaminophen (TYLENOL) 160 MG/5ML solution 650 mg  650 mg Per Tube Q4H PRN Flora Lipps, MD      . apixaban (ELIQUIS) tablet 5 mg  5 mg Oral BID Val Riles, MD   5 mg at 07/01/20 0930  . arformoterol (BROVANA) nebulizer solution 15 mcg  15 mcg Nebulization BID Candee Furbish, MD   15 mcg at  07/01/20 0749  . chlorhexidine gluconate (MEDLINE KIT) (PERIDEX) 0.12 % solution 15 mL  15 mL Mouth Rinse BID Flora Lipps, MD   15 mL at 07/01/20 0928  . Chlorhexidine Gluconate Cloth 2 % PADS 6 each  6 each Topical Daily Flora Lipps, MD   6 each at 07/01/20 0926  . clonazePAM (KLONOPIN) tablet 1 mg  1 mg Per Tube BID Candee Furbish, MD   1 mg at 07/01/20 1224  . diazepam (VALIUM) tablet 5 mg  5 mg Per Tube Q6H PRN Awilda Bill, NP   5 mg at 06/30/20 2107  . feeding supplement (JEVITY 1.2 CAL) liquid 237 mL  237 mL Per Tube 6 X Daily Val Riles, MD   237 mL at 07/01/20 1446  . feeding supplement (PROSource TF) liquid 45 mL  45 mL Per Tube Daily Val Riles, MD   45 mL at 07/01/20 0931  . fentaNYL (DURAGESIC) 50 MCG/HR 1 patch  1 patch Transdermal Q72H Tyler Pita, MD   1 patch at 06/29/20 1229  . free water 60 mL  60 mL Per Tube 6 X Daily Val Riles, MD   60 mL at 07/01/20 1447  . gabapentin (NEURONTIN) capsule 300 mg  300  mg Per Tube TID Tyler Pita, MD   300 mg at 07/01/20 1644  . insulin aspart (novoLOG) injection 0-6 Units  0-6 Units Subcutaneous Q4H Dallie Piles, RPH   1 Units at 06/24/20 1539  . ipratropium-albuterol (DUONEB) 0.5-2.5 (3) MG/3ML nebulizer solution 3 mL  3 mL Nebulization Q6H PRN Awilda Bill, NP   3 mL at 06/23/20 0833  . labetalol (NORMODYNE) injection 10 mg  10 mg Intravenous Q4H PRN Tyna Jaksch, MD   10 mg at 06/28/20 8527  . MEDLINE mouth rinse  15 mL Mouth Rinse 10 times per day Flora Lipps, MD   15 mL at 07/01/20 1447  . metoprolol tartrate (LOPRESSOR) injection 5 mg  5 mg Intravenous Q3H PRN Ottie Glazier, MD   5 mg at 06/17/20 2006  . metoprolol tartrate (LOPRESSOR) tablet 50 mg  50 mg Per Tube BID Candee Furbish, MD   50 mg at 07/01/20 0929  . ondansetron (ZOFRAN) injection 4 mg  4 mg Intravenous Q6H PRN Flora Lipps, MD      . oxyCODONE (Oxy IR/ROXICODONE) immediate release tablet 10 mg  10 mg Per Tube Q4H PRN Candee Furbish, MD   10 mg at 07/01/20 1625  . pantoprazole (PROTONIX) injection 40 mg  40 mg Intravenous QHS Dallie Piles, RPH   40 mg at 06/30/20 2105  . polyethylene glycol (MIRALAX / GLYCOLAX) packet 17 g  17 g Per Tube Daily PRN Flora Lipps, MD      . QUEtiapine (SEROQUEL) tablet 25 mg  25 mg Per Tube QHS Tyler Pita, MD   25 mg at 06/30/20 2107  . sodium chloride flush (NS) 0.9 % injection 10-40 mL  10-40 mL Intracatheter Q12H Tyler Pita, MD   40 mL at 07/01/20 0930  . sodium chloride flush (NS) 0.9 % injection 10-40 mL  10-40 mL Intracatheter PRN Tyler Pita, MD   10 mL at 06/30/20 2104  . sodium chloride flush (NS) 0.9 % injection 3 mL  3 mL Intravenous Q12H Flora Lipps, MD   3 mL at 07/01/20 0930  . sodium chloride flush (NS) 0.9 % injection 3 mL  3 mL Intravenous PRN Flora Lipps, MD       No Known Allergies   Ros: No headaches, fever, sinus pressure, change in visios, no neck pain, or blood per trach. Short of breath, thick sputum. Able to suction self. No pleurisy, andina, ectopy, leg pain or advancing edema. No abdominal pain, peg tube present, no loose stools, no rashes, nodes, focal neuro complaints, anxious     Blood pressure (!) 145/84, pulse 90, temperature 99.5 F (37.5 C), resp. rate 16, height 5' 2.01" (1.575 m), weight 54.3 kg, SpO2 99 %. Sitting up in bed, following commands, good color, more responsive less dyspnea Heent: [place], NT, EOMI, Non juandiced Neck supple, trach in place, clean Chest moving air, no rubs, wheezing, mild rhonci Cardiac: rrr, no loud rubs Abd: soft, non tender, site clean Ext. No homan's sign, no edema or calf pain Neuro, moving all extremities equally Psych following commands, anxious, reasured   Assessment & Plan  Acute on chronic hypoxemic and hypercapnic respiratory failure off ventilator,  s/p tracheostomy.respiratory status improved over the weak end.   Dysphagia, moderate protein calorie malnutrition- now s/p  PEG  Severe baseline COPD- complicated trach wean  Muscular deconditioning  Chronic anxiety/depression  - continue duonebs from duonebs to brovana/duo nebsi - PT/OT - Continue standing clonazepam, use  valium per tube for breakthrough -avoid heavy sedation -less sputum on gycopyrrolate -mag, phos in am  Wallene Huh MD

## 2020-07-03 NOTE — Progress Notes (Signed)
Progress Note    ANALY BASSFORD  QAS:[id number] DOB: 1950-08-10  DOA: 06/06/2020 PCP: Idelle Crouch, MD      Brief Narrative:    Medical records reviewed and are as summarized below: All normal Virginia [name] is a 70 y.o. female severe COPD admitted with acute on chronic hypoxic hypercapnic respiratory failure and acute encephalopathy secondary to narcotic use and AECOPD requiring mechanical intubation  Now s/p trach/PEG working on trach weaning.      Assessment/Plan:   Active Problems:   Respiratory failure (HCC)   COPD exacerbation (HCC)   Nutrition Problem: Increased nutrient needs Etiology: catabolic illness (COPD)  Signs/Symptoms: estimated needs   Body mass index is 22.17 kg/m.     Acute on chronic hypoxemic and hypercapnic respiratory failure  Failure to wean from ventilator so tracheostomy was done.  She still has same oxygen requirements (FiO2 of 35% at 8 L/min).  Taper down oxygen as able.  Dysphagia, moderate protein calorie malnutrition- now s/p PEG for enteral nutrition.  She is tolerating clear liquid diet.  Severe baseline COPD-continue bronchodilators.  She is off of steroids.  Muscular deconditioning: PT and OT recommend discharge to SNF  Chronic anxiety/depression: Continue psychotropics and benzodiazepines  Metabolic encephalopathy/agitation- s/p precedex gtt Mental status has improved - Weaned off precedex, use clonidine taper as bridge -Continue fentanyl patch   Paroxysmal A. Fib Continue Eliquis   Urinary retention: Resolved.  Foley catheter was removed from 06/30/2020  Peer to peer review was done with Dr. Bertram Gala health insurance) on 06/30/2020.  Discharge to LTAC was not approved.  Patient's family has launched an appeal.  Awaiting update from appeal. She is reluctant to go to SNF.     Diet Order            Diet clear liquid Room service appropriate? Yes with Assist; Fluid consistency: Thin   Diet effective now                    Consultants:  Intensivist  ENT surgeon  Procedures:  Intubation and mechanical ventilation  Tracheostomy  PEG tube placement     Medications:   . sodium chloride   Intravenous Once  . apixaban  5 mg Oral BID  . arformoterol  15 mcg Nebulization BID  . chlorhexidine gluconate (MEDLINE KIT)  15 mL Mouth Rinse BID  . Chlorhexidine Gluconate Cloth  6 each Topical Daily  . clonazePAM  1 mg Per Tube BID  . feeding supplement (JEVITY 1.2 CAL)  237 mL Per Tube 6 X Daily  . feeding supplement (PROSource TF)  45 mL Per Tube Daily  . fentaNYL  1 patch Transdermal Q72H  . free water  60 mL Per Tube 6 X Daily  . gabapentin  300 mg Per Tube TID  . glycopyrrolate  1 mg Per Tube BID  . insulin aspart  0-6 Units Subcutaneous Q4H  . mouth rinse  15 mL Mouth Rinse 10 times per day  . metoprolol tartrate  50 mg Per Tube BID  . pantoprazole (PROTONIX) IV  40 mg Intravenous QHS  . QUEtiapine  25 mg Per Tube QHS  . sodium chloride flush  10-40 mL Intracatheter Q12H  . sodium chloride flush  3 mL Intravenous Q12H   Continuous Infusions: . sodium chloride Stopped (06/25/20 1842)  . sodium chloride       Anti-infectives (From admission, onward)   Start     Dose/Rate Route Frequency Ordered Stop  06/21/20 1200  ceFAZolin (ANCEF) IVPB 2g/100 mL premix       Note to Pharmacy: Please have at bedside. Will be administered one hour prior to procedure. Endo staff will instruct on when to start infusion.   2 g 200 mL/hr over 30 Minutes Intravenous  Once 06/21/20 0820 06/21/20 2030   06/07/20 1400  azithromycin (ZITHROMAX) 500 mg in sodium chloride 0.9 % 250 mL IVPB  Status:  Discontinued        500 mg 250 mL/hr over 60 Minutes Intravenous Every 24 hours 06/06/20 2030 06/06/20 2044   06/07/20 1400  azithromycin (ZITHROMAX) 500 mg in sodium chloride 0.9 % 250 mL IVPB  Status:  Discontinued        500 mg 250 mL/hr over 60 Minutes Intravenous Every  24 hours 06/06/20 2044 06/08/20 1031   06/06/20 1415  cefTRIAXone (ROCEPHIN) 2 g in sodium chloride 0.9 % 100 mL IVPB        2 g 200 mL/hr over 30 Minutes Intravenous  Once 06/06/20 1407 06/06/20 1514   06/06/20 1415  azithromycin (ZITHROMAX) 500 mg in sodium chloride 0.9 % 250 mL IVPB        500 mg 250 mL/hr over 60 Minutes Intravenous  Once 06/06/20 1407 06/06/20 1708             Family Communication/Anticipated D/C date and plan/Code Status   DVT prophylaxis: SCDs Start: 06/06/20 1430 apixaban (ELIQUIS) tablet 5 mg     Code Status: Full Code  Family Communication: None Disposition Plan:    Status is: Inpatient  Remains inpatient appropriate because:Unsafe d/c plan   Dispo: The patient is from: Home              Anticipated d/c is to: SNF              Anticipated d/c date is: 3 days              Patient currently is not medically stable to d/c.           Subjective:   Interval events noted.  No complaints.  Objective:    Vitals:   07/03/20 0447 07/03/20 0734 07/03/20 0810 07/03/20 1207  BP: 117/76 106/67  114/66  Pulse: 95 99  88  Resp: _0 Temp: 98.2 F (36.8 C) 98.8 F (37.1 C)  99.2 F (37.3 C)  TempSrc:  Oral  Oral  SpO2: 94% 96% 99% 99%  Weight:      Height:       No data found.   Intake/Output Summary (Last 24 hours) at 07/03/2020 1650 Last data filed at 07/03/2020 1057 Gross per 24 hour  Intake 290 ml  Output --  Net 290 ml   Filed Weights   06/28/20 0438 07/01/20 0500 07/02/20 4373  Weight: 56.7 kg 54.3 kg 55 kg    Exam:  GEN: NAD SKIN: Warm and dry EYES: No pallor or icterus ENT: Tracheostomy CV: RRR PULM: CTA B ABD: soft, ND, NT, +BS, PEG tube in place CNS: AAO x 3, non focal EXT: No edema or tenderness            Data Reviewed:   I have personally reviewed following labs and imaging studies:  Labs: Labs show the following:   Basic Metabolic Panel: Recent Labs  Lab 06/27/20 0540  06/28/20 0500 06/29/20 0255 06/30/20 0941 07/01/20 0510 07/02/20 0441 07/03/20 0438  NA 141 143 142 141 140  --   --  K 4.0 4.3 4.3 4.4 4.2  --   --   CL 93* 95* 91* 93* 93*  --   --   CO2 41* 40* 41* 40* 38*  --   --   GLUCOSE 102* 105* 99 105* 101*  --   --   BUN _0 --   --   CREATININE 0.57 0.53 0.56 0.48 0.53  --   --   CALCIUM 8.9 9.5 9.0 9.0 9.0  --   --   MG 1.9 1.9 2.0 2.1 2.1 2.0 2.0  PHOS 5.6* 5.3* 5.2* 4.2 4.1 4.7* 4.4   GFR Estimated Creatinine Clearance: 52.5 mL/min (by C-G formula based on SCr of 0.53 mg/dL). Liver Function Tests: No results for input(s): AST, ALT, ALKPHOS, BILITOT, PROT, ALBUMIN in the last 168 hours. No results for input(s): LIPASE, AMYLASE in the last 168 hours. No results for input(s): AMMONIA in the last 168 hours. Coagulation profile No results for input(s): INR, PROTIME in the last 168 hours.  CBC: Recent Labs  Lab 06/27/20 0540 06/28/20 0500 06/29/20 0255 06/30/20 0941 07/01/20 0510 07/02/20 0441 07/03/20 0438  WBC 6.7 7.9 7.7 7.3 7.1 7.1 6.5  NEUTROABS 4.6 5.3  --   --   --   --   --   HGB 7.8* 8.4* 8.5* 8.7* 8.5* 7.7* 8.1*  HCT 26.0* 28.1* 28.8* 28.9* 27.8* 25.8* 26.5*  MCV 98.5 98.6 99.7 98.6 97.5 97.4 97.8  PLT 241 316 317 369 394 376 361   Cardiac Enzymes: No results for input(s): CKTOTAL, CKMB, CKMBINDEX, TROPONINI in the last 168 hours. BNP (last 3 results) No results for input(s): PROBNP in the last 8760 hours. CBG: Recent Labs  Lab 07/02/20 2055 07/03/20 0035 07/03/20 0429 07/03/20 0736 07/03/20 1209  GLUCAP 84 77 75 110* 89   D-Dimer: No results for input(s): DDIMER in the last 72 hours. Hgb A1c: No results for input(s): HGBA1C in the last 72 hours. Lipid Profile: No results for input(s): CHOL, HDL, LDLCALC, TRIG, CHOLHDL, LDLDIRECT in the last 72 hours. Thyroid function studies: No results for input(s): TSH, T4TOTAL, T3FREE, THYROIDAB in the last 72 hours.  Invalid input(s):  FREET3 Anemia work up: No results for input(s): VITAMINB12, FOLATE, FERRITIN, TIBC, IRON, RETICCTPCT in the last 72 hours. Sepsis Labs: Recent Labs  Lab 06/30/20 0941 07/01/20 0510 07/02/20 0441 07/03/20 0438  WBC 7.3 7.1 7.1 6.5    Microbiology No results found for this or any previous visit (from the past 240 hour(s)).  Procedures and diagnostic studies:  No results found.             LOS: 27 days   [place] [employer], advertising on [email or web address]. If 7PM-7AM, please contact night-coverage at [email or web address]     07/03/2020, 4:50 PM

## 2020-07-03 NOTE — Progress Notes (Addendum)
Physical Therapy Treatment Patient Details Name: Virginia [name] MRN: [id number] DOB: 02/16/1951 Today's Date: 07/03/2020    History of Present Illness 70 yo female with severe COPD admitted with acute on chronic hypoxic hypercapnic respiratory failure and acute encephalopathy secondary to narcotic use and AECOPD requiring mechanical intubation. Initially intubated from 12/14-12/16 and then again from 12/18-12/23. Now with + trach collar. Recent hospitalization from 12/10-12/12/21.    PT Comments    Returned per pt request as she was disappointed she could not do more during earlier session.  To EOB with min a x 1 where session focused on unsupported sitting balance.  She is generally min guard but does need min a at times as she fatigues.  Often leans down on R elbow to rest.  Sats more stable this session with high 80's to low 90's.  She does drop for a while into low 80's but is able to recover.  Sits for 10 minutes EOB   Follow Up Recommendations  SNF     Equipment Recommendations   (TBD)    Recommendations for Other Services       Precautions / Restrictions Precautions Precautions: Fall Restrictions Weight Bearing Restrictions: No Other Position/Activity Restrictions: watch sats    Mobility  Bed Mobility Overal bed mobility: Needs Assistance Bed Mobility: Supine to Sit;Sit to Supine     Supine to sit: Min guard Sit to supine: Mod assist      Transfers Overall transfer level: Needs assistance Equipment used: Rolling walker (2 wheeled) Transfers: Sit to/from Stand Sit to Stand: Mod assist            Ambulation/Gait                 Stairs             Wheelchair Mobility    Modified Rankin (Stroke Patients Only)       Balance Overall balance assessment: Needs assistance Sitting-balance support: Single extremity supported;Feet supported Sitting balance-Leahy Scale: Fair     Standing balance support: Bilateral upper extremity  supported Standing balance-Leahy Scale: Poor                              Cognition Arousal/Alertness: Awake/alert Behavior During Therapy: WFL for tasks assessed/performed Overall Cognitive Status: Within Functional Limits for tasks assessed                                        Exercises      General Comments General comments (skin integrity, edema, etc.): focused on sitting balance and tolerance      Pertinent Vitals/Pain Pain Assessment: No/denies pain Pain Intervention(s): Monitored during session    Home Living                      Prior Function            PT Goals (current goals can now be found in the care plan section) Progress towards PT goals: Progressing toward goals    Frequency    Min 2X/week      PT Plan Current plan remains appropriate    Co-evaluation              AM-PAC PT "6 Clicks" Mobility   Outcome Measure  Help needed turning from your back to your side while in a flat  bed without using bedrails?: A Little Help needed moving from lying on your back to sitting on the side of a flat bed without using bedrails?: A Little Help needed moving to and from a bed to a chair (including a wheelchair)?: A Lot Help needed standing up from a chair using your arms (e.g., wheelchair or bedside chair)?: A Lot Help needed to walk in hospital room?: Total Help needed climbing 3-5 steps with a railing? : Total 6 Click Score: 12    End of Session Equipment Utilized During Treatment: Oxygen Activity Tolerance: Patient tolerated treatment well Patient left: in bed;with call bell/phone within reach;with bed alarm set Nurse Communication: Mobility status PT Visit Diagnosis: Muscle weakness (generalized) (M62.81);Difficulty in walking, not elsewhere classified (R26.2)     Time: 9326-7124 PT Time Calculation (min) (ACUTE ONLY): 19 min  Charges:  $Therapeutic Activity: 8-22 mins                    Danielle Dess, PTA 07/03/20, 12:50 PM

## 2020-07-04 DIAGNOSIS — J9621 Acute and chronic respiratory failure with hypoxia: Secondary | ICD-10-CM | POA: Diagnosis not present

## 2020-07-04 DIAGNOSIS — J9622 Acute and chronic respiratory failure with hypercapnia: Secondary | ICD-10-CM | POA: Diagnosis not present

## 2020-07-04 DIAGNOSIS — J441 Chronic obstructive pulmonary disease with (acute) exacerbation: Secondary | ICD-10-CM | POA: Diagnosis not present

## 2020-07-04 LAB — GLUCOSE, CAPILLARY
Glucose-Capillary: 104 mg/dL — ABNORMAL HIGH (ref 70–99)
Glucose-Capillary: 107 mg/dL — ABNORMAL HIGH (ref 70–99)
Glucose-Capillary: 71 mg/dL (ref 70–99)
Glucose-Capillary: 85 mg/dL (ref 70–99)
Glucose-Capillary: 85 mg/dL (ref 70–99)
Glucose-Capillary: 93 mg/dL (ref 70–99)

## 2020-07-04 LAB — PHOSPHORUS: Phosphorus: 5.6 mg/dL — ABNORMAL HIGH (ref 2.5–4.6)

## 2020-07-04 LAB — MAGNESIUM: Magnesium: 2 mg/dL (ref 1.7–2.4)

## 2020-07-04 MED ORDER — OSMOLITE 1.2 CAL PO LIQD
237.0000 mL | Freq: Every day | ORAL | Status: DC
  Administered 2020-07-06 – 2020-07-13 (×37): 237 mL

## 2020-07-04 NOTE — Progress Notes (Signed)
Nutrition Follow-up  RD working remotely.  DOCUMENTATION CODES:   Not applicable  INTERVENTION:  - will change TF formula to Osmolite 1.2, which is fiber-free and gentler on GI system. - 1 carton (237 ml) Osmolite 1.2 x6/day with 45 ml Prosource BID and 60 ml free water/TF bolus. - this regimen will provide 1790 kcal, 101 grams protein, 0 grams fiber, and 1530 ml free water.   NUTRITION DIAGNOSIS:   Increased nutrient needs related to catabolic illness (COPD) as evidenced by estimated needs. -ongoing  GOAL:   Patient will meet greater than or equal to 90% of their needs -met with TF regimen  MONITOR:   PO intake,Diet advancement,TF tolerance,Labs,Weight trends  ASSESSMENT:   70 year old female with PMHx of COPD, hx tracheostomy tube placement and PEG tube placement in 2017, HTN, depression, RLS, osteoporosis, hepatitis C, A-fib, anxiety, arthritis admitted with acute on chronic hypoxic hypercapnic respiratory failure and acute encephalopathy secondary to narcotic use and acute exacerbation of COPD.  RD is working on another campus. RD who works on campus at [facility] reported that she had been informed that patient may not be tolerating Jevity 1.2. Able to communicate with RN via secure chat who reports no issues with PEG. She reports that patient has been refusing TF.     She remains Full Code at this time.   PEG placed 12/29. Patient with trach. She is currently ordered 1 carton (237 ml) Jevity 1.2 x6/day with 45 ml Prosource TF once/day and 60 ml free water/TF bolus (30 ml before and 30 ml after each bolus). This regimen is providing 1750 kcal, 91 grams protein, 24 grams fiber, and 1506 ml free water.   Weight has been stable over the past 1 week. Mild pitting edema to BLE documented in the edema section of flow sheet.   Diet advanced to CLD on 1/5 with 100% of lunch on 1/7 consumed; otherwise, meal completion of 0% since advancement.    Labs reviewed; CBGs: 93, 85, 85, 104  mg/dl, Phos: 5.6 mg/dl.  Medications reviewed; sliding scale novolog.   Diet Order:   Diet Order            Diet clear liquid Room service appropriate? Yes with Assist; Fluid consistency: Thin  Diet effective now                 EDUCATION NEEDS:   No education needs have been identified at this time  Skin:  Skin Assessment: Reviewed RN Assessment (MASD bilateral groin) Skin Integrity Issues:: Incisions Incisions: closed incision to neck  Last BM:  1/8 (type 6)  Height:   Ht Readings from Last 1 Encounters:  06/06/20 5' 2.01" (1.575 m)    Weight:   Wt Readings from Last 1 Encounters:  07/04/20 55.4 kg     Estimated Nutritional Needs:  Kcal:  1500-1750 Protein:  85-95 grams Fluid:  1.5-1.7 L/day      Jarome Matin, MS, RD, LDN, CNSC Inpatient Clinical Dietitian RD pager # available in AMION  After hours/weekend pager # available in [facility]

## 2020-07-04 NOTE — Progress Notes (Signed)
In bed, trach mask in place, comfortable, no new complaints, pt/oy in progress. Has a peg tube. No worsening dyspnea or wheezing. Off vent x 5 days or so.    Assessment & Plan:   Active Problems:   Respiratory failure (HCC)   COPD exacerbation (HCC)  Acute on chronic hypoxemic and hypercapnic respiratory failureoffventilator,s/p tracheostomy.respiratory status improved over the weak end.   Dysphagia, moderate protein calorie malnutrition- now s/p PEG  Severe baseline COPD- complicated trach wean  Muscular deconditioning  Chronic anxiety/depression  -continueduonebs from duonebs to brovana/duo nebsi - PT/OT - Continue standing clonazepam, use valium per tube for breakthrough -avoid heavy sedation -less sputum on gycopyrrolate, no clogging -mag, phos in am -since not on vent, looking better each day, skill care may be appropriate for her   Objective: Vitals:   07/04/20 0546 07/04/20 0847 07/04/20 1233 07/04/20 1604  BP: 108/79 106/76 123/74 133/82  Pulse: 78 76 80 92  Resp: '16 16 16 16  ' Temp: 98.2 F (36.8 C) (!) 97.4 F (36.3 C) 98.8 F (37.1 C) 98.9 F (37.2 C)  TempSrc:    Oral  SpO2: 92% 99% 99% (!) 76%  Weight:      Height:        Intake/Output Summary (Last 24 hours) at 07/04/2020 1703 Last data filed at 07/04/2020 1135 Gross per 24 hour  Intake 0 ml  Output 1300 ml  Net -1300 ml   Filed Weights   07/01/20 0500 07/02/20 0620 07/04/20 0145  Weight: 54.3 kg 55 kg 55.4 kg    Examination:  General exam: Appears calm and comfortable, smiling, fully understanding the plan HEENT: [place]/nt, no juandice Neck: supple, no stridr, trach site clean Respiratory system: Clear to auscultation. Respiratory effort normal. Cardiovascular system: S1 & S2 heard, RRR. No JVD, murmurs, rubs, gallops or clicks. No pedal edema. Gastrointestinal system: Abdomen is nondistended, soft and nontender. No organomegaly or masses felt. Normal bowel sounds  heard. Central nervous system: Alert and oriented. No focal neurological deficits. Extremities: Symmetric 5 x 5 power. Skin: No rashes, lesions or ulcers Psychiatry: Judgement and insight appear normal. Mood & affect appropriate.     Data Reviewed: I have personally reviewed following labs and imaging studies  CBC: Recent Labs  Lab 06/28/20 0500 06/29/20 0255 06/30/20 0941 07/01/20 0510 07/02/20 0441 07/03/20 0438  WBC 7.9 7.7 7.3 7.1 7.1 6.5  NEUTROABS 5.3  --   --   --   --   --   HGB 8.4* 8.5* 8.7* 8.5* 7.7* 8.1*  HCT 28.1* 28.8* 28.9* 27.8* 25.8* 26.5*  MCV 98.6 99.7 98.6 97.5 97.4 97.8  PLT 316 317 369 394 376 450   Basic Metabolic Panel: Recent Labs  Lab 06/28/20 0500 06/29/20 0255 06/30/20 0941 07/01/20 0510 07/02/20 0441 07/03/20 0438 07/04/20 0516  NA 143 142 141 140  --   --   --   K 4.3 4.3 4.4 4.2  --   --   --   CL 95* 91* 93* 93*  --   --   --   CO2 40* 41* 40* 38*  --   --   --   GLUCOSE 105* 99 105* 101*  --   --   --   BUN '15 20 19 20  ' --   --   --   CREATININE 0.53 0.56 0.48 0.53  --   --   --   CALCIUM 9.5 9.0 9.0 9.0  --   --   --  MG 1.9 2.0 2.1 2.1 2.0 2.0 2.0  PHOS 5.3* 5.2* 4.2 4.1 4.7* 4.4 5.6*   CBG: Recent Labs  Lab 07/04/20 0039 07/04/20 0416 07/04/20 0848 07/04/20 1229 07/04/20 1552  GLUCAP 93 85 85 104* 71   Urine analysis:    Component Value Date/Time   COLORURINE STRAW (A) 02/20/2017 1116   APPEARANCEUR CLEAR (A) 02/20/2017 1116   LABSPEC 1.004 (L) 02/20/2017 1116   PHURINE 7.0 02/20/2017 1116   GLUCOSEU NEGATIVE 02/20/2017 1116   [place] 02/20/2017 1116   [place] 02/20/2017 1116   KETONESUR NEGATIVE 02/20/2017 1116   PROTEINUR NEGATIVE 02/20/2017 1116   NITRITE NEGATIVE 02/20/2017 1116   LEUKOCYTESUR NEGATIVE 02/20/2017 1116   Sepsis Labs: '@Radiology'  Studies: No results found. Scheduled Meds: . sodium chloride   Intravenous Once  . apixaban  5 mg Oral BID  . arformoterol  15 mcg  Nebulization BID  . chlorhexidine gluconate (MEDLINE KIT)  15 mL Mouth Rinse BID  . Chlorhexidine Gluconate Cloth  6 each Topical Daily  . clonazePAM  1 mg Per Tube BID  . feeding supplement (OSMOLITE 1.2 CAL)  237 mL Per Tube 6 X Daily  . feeding supplement (PROSource TF)  45 mL Per Tube Daily  . fentaNYL  1 patch Transdermal Q72H  . free water  60 mL Per Tube 6 X Daily  . gabapentin  300 mg Per Tube TID  . glycopyrrolate  1 mg Per Tube BID  . insulin aspart  0-6 Units Subcutaneous Q4H  . mouth rinse  15 mL Mouth Rinse 10 times per day  . metoprolol tartrate  50 mg Per Tube BID  . pantoprazole (PROTONIX) IV  40 mg Intravenous QHS  . QUEtiapine  25 mg Per Tube QHS  . sodium chloride flush  10-40 mL Intracatheter Q12H  . sodium chloride flush  3 mL Intravenous Q12H   Continuous Infusions: . sodium chloride Stopped (06/25/20 1842)  . sodium chloride       LOS: 28 days    Time spent: 41 min    Wallene Huh, MD 07/04/2020, 5:03 PM

## 2020-07-04 NOTE — Progress Notes (Signed)
Occupational Therapy Treatment Patient Details Name: Virginia [name] MRN: [id number] DOB: [birth year] Today's Date: 07/04/2020    History of present illness 70 yo female with severe COPD admitted with acute on chronic hypoxic hypercapnic respiratory failure and acute encephalopathy secondary to narcotic use and AECOPD requiring mechanical intubation. Initially intubated from 12/14-12/16 and then again from 12/18-12/23. Now with + trach collar. Recent hospitalization from 12/10-12/12/21.   OT comments  Upon entering the room, pt supine in bed and agreeable to OT intervention. Pt using pen and paper to notify therapist of needs. Pt requesting suctioning and RN notified. O2 saturation was 100% and pt requesting to continue to work with therapist while waiting for RT. OT provided education and demonstration of therapeutic exercises with use of yellow resistive theraband. Pt returning demonstrations of 3 sets of 10 reps bicep curls, alternating punches, chest pulls, and shoulder elevation ( limited on R UE secondary to decreased ROM). Pt needing multiple rest breaks for fatigue. O2 saturation remained above 90% with exercise. RT arrived for suction. OT left theraband and exercise handout for pt to use when able. Pt continues to benefit from OT intervention with call bell and all needed items within reach. Recommendation for SNF at discharge to continue to address functional deficits.    Follow Up Recommendations  SNF    Equipment Recommendations  Other (comment) (defer to next venue of care)       Precautions / Restrictions Precautions Precautions: Fall              ADL either performed or assessed with clinical judgement        Vision Patient Visual Report: No change from baseline            Cognition Arousal/Alertness: Awake/alert Behavior During Therapy: WFL for tasks assessed/performed Overall Cognitive Status: Within Functional Limits for tasks assessed                                                      Pertinent Vitals/ Pain       Pain Assessment: No/denies pain         Frequency  Min 1X/week        Progress Toward Goals  OT Goals(current goals can now be found in the care plan section)  Progress towards OT goals: Progressing toward goals  Acute Rehab OT Goals Patient Stated Goal: To return home safely OT Goal Formulation: With patient Time For Goal Achievement: 07/06/20 Potential to Achieve Goals: Good  Plan Discharge plan remains appropriate       AM-PAC OT "6 Clicks" Daily Activity     Outcome Measure   Help from another person eating meals?: A Little Help from another person taking care of personal grooming?: A Little Help from another person toileting, which includes using toliet, bedpan, or urinal?: A Little Help from another person bathing (including washing, rinsing, drying)?: A Little Help from another person to put on and taking off regular upper body clothing?: A Little Help from another person to put on and taking off regular lower body clothing?: A Little 6 Click Score: 18    End of Session Equipment Utilized During Treatment: Oxygen;Other (comment) (trach collar)  OT Visit Diagnosis: Other abnormalities of gait and mobility (R26.89);Muscle weakness (generalized) (M62.81)   Activity Tolerance Patient tolerated treatment well   Patient Left in  bed;with call bell/phone within reach;with nursing/sitter in room   Nurse Communication Mobility status        Time: 3716-9678 OT Time Calculation (min): 28 min  Charges: OT General Charges $OT Visit: 1 Visit OT Treatments $Therapeutic Exercise: 23-37 mins  Jackquline Denmark, MS, OTR/L , CBIS ascom [phone]  07/04/20, 4:31 PM

## 2020-07-04 NOTE — Progress Notes (Addendum)
Progress Note    Virginia [name]  FIE:[id number] DOB: March 06, 1951  DOA: 06/06/2020 PCP: Idelle Crouch, MD      Brief Narrative:    Medical records reviewed and are as summarized below: All normal Virginia [name] is a 70 y.o. female severe COPD admitted with acute on chronic hypoxic hypercapnic respiratory failure and acute encephalopathy secondary to narcotic use and AECOPD requiring mechanical intubation  Now s/p trach/PEG working on trach weaning.      Assessment/Plan:   Active Problems:   Respiratory failure (HCC)   COPD exacerbation (HCC)   Nutrition Problem: Increased nutrient needs Etiology: catabolic illness (COPD)  Signs/Symptoms: estimated needs   Body mass index is 22.33 kg/m.     Acute on chronic hypoxemic and hypercapnic respiratory failure  Failure to wean from ventilator so tracheostomy was done.  She still has same oxygen requirements (FiO2 of 35% at 8 L/min).  Taper down oxygen as able.  Dysphagia, moderate protein calorie malnutrition- now s/p PEG for enteral nutrition.  She is tolerating clear liquid diet.  Severe baseline COPD-continue bronchodilators.  She is off of steroids.  Muscular deconditioning: PT and OT recommend discharge to SNF  Chronic anxiety/depression: Continue psychotropics and benzodiazepines  Metabolic encephalopathy/agitation- s/p precedex gtt Mental status has improved - Weaned off precedex, use clonidine taper as bridge -Continue fentanyl patch   Paroxysmal A. Fib Continue Eliquis and metoprolol   Urinary retention: Resolved.  Foley catheter was removed from 06/30/2020  Peer to peer review was done with Dr. Bertram Gala health insurance) on 06/30/2020.  Discharge to LTAC was not approved.  Patient's family has launched an appeal.  Awaiting update from appeal. She is reluctant to go to SNF.  Awaiting disposition.     Diet Order            Diet clear liquid Room service appropriate? Yes  with Assist; Fluid consistency: Thin  Diet effective now                    Consultants:  Intensivist  ENT surgeon  Procedures:  Intubation and mechanical ventilation  Tracheostomy  PEG tube placement     Medications:   . sodium chloride   Intravenous Once  . apixaban  5 mg Oral BID  . arformoterol  15 mcg Nebulization BID  . chlorhexidine gluconate (MEDLINE KIT)  15 mL Mouth Rinse BID  . Chlorhexidine Gluconate Cloth  6 each Topical Daily  . clonazePAM  1 mg Per Tube BID  . feeding supplement (JEVITY 1.2 CAL)  237 mL Per Tube 6 X Daily  . feeding supplement (PROSource TF)  45 mL Per Tube Daily  . fentaNYL  1 patch Transdermal Q72H  . free water  60 mL Per Tube 6 X Daily  . gabapentin  300 mg Per Tube TID  . glycopyrrolate  1 mg Per Tube BID  . insulin aspart  0-6 Units Subcutaneous Q4H  . mouth rinse  15 mL Mouth Rinse 10 times per day  . metoprolol tartrate  50 mg Per Tube BID  . pantoprazole (PROTONIX) IV  40 mg Intravenous QHS  . QUEtiapine  25 mg Per Tube QHS  . sodium chloride flush  10-40 mL Intracatheter Q12H  . sodium chloride flush  3 mL Intravenous Q12H   Continuous Infusions: . sodium chloride Stopped (06/25/20 1842)  . sodium chloride       Anti-infectives (From admission, onward)   Start  Dose/Rate Route Frequency Ordered Stop   06/21/20 1200  ceFAZolin (ANCEF) IVPB 2g/100 mL premix       Note to Pharmacy: Please have at bedside. Will be administered one hour prior to procedure. Endo staff will instruct on when to start infusion.   2 g 200 mL/hr over 30 Minutes Intravenous  Once 06/21/20 0820 06/21/20 2030   06/07/20 1400  azithromycin (ZITHROMAX) 500 mg in sodium chloride 0.9 % 250 mL IVPB  Status:  Discontinued        500 mg 250 mL/hr over 60 Minutes Intravenous Every 24 hours 06/06/20 2030 06/06/20 2044   06/07/20 1400  azithromycin (ZITHROMAX) 500 mg in sodium chloride 0.9 % 250 mL IVPB  Status:  Discontinued        500 mg 250  mL/hr over 60 Minutes Intravenous Every 24 hours 06/06/20 2044 06/08/20 1031   06/06/20 1415  cefTRIAXone (ROCEPHIN) 2 g in sodium chloride 0.9 % 100 mL IVPB        2 g 200 mL/hr over 30 Minutes Intravenous  Once 06/06/20 1407 06/06/20 1514   06/06/20 1415  azithromycin (ZITHROMAX) 500 mg in sodium chloride 0.9 % 250 mL IVPB        500 mg 250 mL/hr over 60 Minutes Intravenous  Once 06/06/20 1407 06/06/20 1708             Family Communication/Anticipated D/C date and plan/Code Status   DVT prophylaxis: SCDs Start: 06/06/20 1430 apixaban (ELIQUIS) tablet 5 mg     Code Status: Full Code  Family Communication: None Disposition Plan:    Status is: Inpatient  Remains inpatient appropriate because:Unsafe d/c plan   Dispo: The patient is from: Home              Anticipated d/c is to: SNF              Anticipated d/c date is: 3 days              Patient currently is not medically stable to d/c.           Subjective:   Interval events noted.  No acute issues.  No shortness of breath or chest pain  Objective:    Vitals:   07/04/20 0507 07/04/20 0546 07/04/20 0847 07/04/20 1233  BP: 91/68 108/79 106/76 123/74  Pulse: 73 78 76 80  Resp:  '16 16 16  ' Temp:  98.2 F (36.8 C) (!) 97.4 F (36.3 C) 98.8 F (37.1 C)  TempSrc:      SpO2:  92% 99% 99%  Weight:      Height:       No data found.   Intake/Output Summary (Last 24 hours) at 07/04/2020 1359 Last data filed at 07/04/2020 1135 Gross per 24 hour  Intake 0 ml  Output 1300 ml  Net -1300 ml   Filed Weights   07/01/20 0500 07/02/20 0620 07/04/20 0145  Weight: 54.3 kg 55 kg 55.4 kg    Exam:   GEN: NAD SKIN: Warm and dry EYES: No pallor or icterus ENT: MMM.  Tracheostomy CV: RRR PULM: CTA B ABD: soft, ND, NT, +BS, +PEG tube CNS: AAO x 3, non focal EXT: No edema or tenderness             Data Reviewed:   I have personally reviewed following labs and imaging studies:  Labs: Labs  show the following:   Basic Metabolic Panel: Recent Labs  Lab 06/28/20 0500 06/29/20 0255 06/30/20 0941  07/01/20 0510 07/02/20 0441 07/03/20 0438 07/04/20 0516  NA 143 142 141 140  --   --   --   K 4.3 4.3 4.4 4.2  --   --   --   CL 95* 91* 93* 93*  --   --   --   CO2 40* 41* 40* 38*  --   --   --   GLUCOSE 105* 99 105* 101*  --   --   --   BUN '15 20 19 20  ' --   --   --   CREATININE 0.53 0.56 0.48 0.53  --   --   --   CALCIUM 9.5 9.0 9.0 9.0  --   --   --   MG 1.9 2.0 2.1 2.1 2.0 2.0 2.0  PHOS 5.3* 5.2* 4.2 4.1 4.7* 4.4 5.6*   GFR Estimated Creatinine Clearance: 52.5 mL/min (by C-G formula based on SCr of 0.53 mg/dL). Liver Function Tests: No results for input(s): AST, ALT, ALKPHOS, BILITOT, PROT, ALBUMIN in the last 168 hours. No results for input(s): LIPASE, AMYLASE in the last 168 hours. No results for input(s): AMMONIA in the last 168 hours. Coagulation profile No results for input(s): INR, PROTIME in the last 168 hours.  CBC: Recent Labs  Lab 06/28/20 0500 06/29/20 0255 06/30/20 0941 07/01/20 0510 07/02/20 0441 07/03/20 0438  WBC 7.9 7.7 7.3 7.1 7.1 6.5  NEUTROABS 5.3  --   --   --   --   --   HGB 8.4* 8.5* 8.7* 8.5* 7.7* 8.1*  HCT 28.1* 28.8* 28.9* 27.8* 25.8* 26.5*  MCV 98.6 99.7 98.6 97.5 97.4 97.8  PLT 316 317 369 394 376 361   Cardiac Enzymes: No results for input(s): CKTOTAL, CKMB, CKMBINDEX, TROPONINI in the last 168 hours. BNP (last 3 results) No results for input(s): PROBNP in the last 8760 hours. CBG: Recent Labs  Lab 07/03/20 2031 07/04/20 0039 07/04/20 0416 07/04/20 0848 07/04/20 1229  GLUCAP 96 93 85 85 104*   D-Dimer: No results for input(s): DDIMER in the last 72 hours. Hgb A1c: No results for input(s): HGBA1C in the last 72 hours. Lipid Profile: No results for input(s): CHOL, HDL, LDLCALC, TRIG, CHOLHDL, LDLDIRECT in the last 72 hours. Thyroid function studies: No results for input(s): TSH, T4TOTAL, T3FREE, THYROIDAB in the  last 72 hours.  Invalid input(s): FREET3 Anemia work up: No results for input(s): VITAMINB12, FOLATE, FERRITIN, TIBC, IRON, RETICCTPCT in the last 72 hours. Sepsis Labs: Recent Labs  Lab 06/30/20 0941 07/01/20 0510 07/02/20 0441 07/03/20 0438  WBC 7.3 7.1 7.1 6.5    Microbiology No results found for this or any previous visit (from the past 240 hour(s)).  Procedures and diagnostic studies:  No results found.             LOS: 28 days   [place] Copywriter, advertising on [email or web address]. If 7PM-7AM, please contact night-coverage at [email or web address]     07/04/2020, 1:59 PM

## 2020-07-04 NOTE — Plan of Care (Addendum)
Pt alert and oriented, on trach, reported pain and PRN tylenol given with fair effect. Pt slept half of the shift. BP soft, pt denied symptoms. Meds crushed and given via PEG tube. Pt refused supplement.  Falls precautions in place. Call bell within reach Problem: Clinical Measurements: Goal: Will remain free from infection Outcome: Progressing   Problem: Nutrition: Goal: Adequate nutrition will be maintained Outcome: Progressing   Problem: Pain Managment: Goal: General experience of comfort will improve Outcome: Progressing

## 2020-07-05 DIAGNOSIS — J441 Chronic obstructive pulmonary disease with (acute) exacerbation: Secondary | ICD-10-CM | POA: Diagnosis not present

## 2020-07-05 DIAGNOSIS — J9622 Acute and chronic respiratory failure with hypercapnia: Secondary | ICD-10-CM | POA: Diagnosis not present

## 2020-07-05 DIAGNOSIS — J9621 Acute and chronic respiratory failure with hypoxia: Secondary | ICD-10-CM | POA: Diagnosis not present

## 2020-07-05 LAB — MAGNESIUM: Magnesium: 1.8 mg/dL (ref 1.7–2.4)

## 2020-07-05 LAB — GLUCOSE, CAPILLARY
Glucose-Capillary: 101 mg/dL — ABNORMAL HIGH (ref 70–99)
Glucose-Capillary: 102 mg/dL — ABNORMAL HIGH (ref 70–99)
Glucose-Capillary: 78 mg/dL (ref 70–99)
Glucose-Capillary: 94 mg/dL (ref 70–99)
Glucose-Capillary: 94 mg/dL (ref 70–99)
Glucose-Capillary: 99 mg/dL (ref 70–99)

## 2020-07-05 LAB — PHOSPHORUS: Phosphorus: 4.9 mg/dL — ABNORMAL HIGH (ref 2.5–4.6)

## 2020-07-05 NOTE — Progress Notes (Signed)
Progress Note    DESSA LEDEE  JGO:[id number] DOB: 1950/07/30  DOA: 06/06/2020 PCP: Idelle Crouch, MD      Brief Narrative:    Medical records reviewed and are as summarized below: All normal ISHITA [name] is a 70 y.o. female severe COPD admitted with acute on chronic hypoxic hypercapnic respiratory failure and acute encephalopathy secondary to narcotic use and AECOPD requiring mechanical intubation.  She was treated with steroids and empiric IV antibiotics.  She could not be liberated from the ventilator so tracheostomy was performed and she has been receiving oxygen via trach collar.  Now s/p trach/PEG working on trach weaning.      Assessment/Plan:   Active Problems:   Respiratory failure (HCC)   COPD exacerbation (HCC)   Nutrition Problem: Increased nutrient needs Etiology: catabolic illness (COPD)  Signs/Symptoms: estimated needs   Body mass index is 22.17 kg/m.     Acute on chronic hypoxemic and hypercapnic respiratory failure  Failure to wean from ventilator so tracheostomy was done.  She still has same oxygen requirements (FiO2 of 35% at 8 L/min).  Taper down oxygen as able.  Dysphagia, moderate protein calorie malnutrition- now s/p PEG for enteral nutrition.  She is tolerating clear liquid diet.  Severe baseline COPD-continue bronchodilators.  She is off of steroids.  Muscular deconditioning: PT and OT recommend discharge to SNF  Chronic anxiety/depression: Continue psychotropics and benzodiazepines  Metabolic encephalopathy/agitation- s/p precedex gtt Mental status has improved back to baseline - Weaned off precedex -Continue fentanyl patch   Paroxysmal A. Fib Continue Eliquis and metoprolol   Urinary retention: Resolved.  Foley catheter was removed from 06/30/2020  Peer to peer review was done with Dr. Bertram Gala health insurance) on 06/30/2020.  Discharge to LTAC was not approved.  Patient's family has launched  an appeal.  Awaiting update from appeal. She is reluctant to go to SNF.  Awaiting disposition.     Diet Order            Diet clear liquid Room service appropriate? Yes with Assist; Fluid consistency: Thin  Diet effective now                    Consultants:  Intensivist  ENT surgeon  Procedures:  Intubation and mechanical ventilation  Tracheostomy  PEG tube placement     Medications:   . sodium chloride   Intravenous Once  . apixaban  5 mg Oral BID  . arformoterol  15 mcg Nebulization BID  . chlorhexidine gluconate (MEDLINE KIT)  15 mL Mouth Rinse BID  . Chlorhexidine Gluconate Cloth  6 each Topical Daily  . clonazePAM  1 mg Per Tube BID  . feeding supplement (OSMOLITE 1.2 CAL)  237 mL Per Tube 6 X Daily  . feeding supplement (PROSource TF)  45 mL Per Tube Daily  . fentaNYL  1 patch Transdermal Q72H  . free water  60 mL Per Tube 6 X Daily  . gabapentin  300 mg Per Tube TID  . glycopyrrolate  1 mg Per Tube BID  . insulin aspart  0-6 Units Subcutaneous Q4H  . metoprolol tartrate  50 mg Per Tube BID  . pantoprazole (PROTONIX) IV  40 mg Intravenous QHS  . QUEtiapine  25 mg Per Tube QHS  . sodium chloride flush  10-40 mL Intracatheter Q12H  . sodium chloride flush  3 mL Intravenous Q12H   Continuous Infusions: . sodium chloride Stopped (06/25/20 1842)  . sodium chloride  Anti-infectives (From admission, onward)   Start     Dose/Rate Route Frequency Ordered Stop   06/21/20 1200  ceFAZolin (ANCEF) IVPB 2g/100 mL premix       Note to Pharmacy: Please have at bedside. Will be administered one hour prior to procedure. Endo staff will instruct on when to start infusion.   2 g 200 mL/hr over 30 Minutes Intravenous  Once 06/21/20 0820 06/21/20 2030   06/07/20 1400  azithromycin (ZITHROMAX) 500 mg in sodium chloride 0.9 % 250 mL IVPB  Status:  Discontinued        500 mg 250 mL/hr over 60 Minutes Intravenous Every 24 hours 06/06/20 2030 06/06/20 2044    06/07/20 1400  azithromycin (ZITHROMAX) 500 mg in sodium chloride 0.9 % 250 mL IVPB  Status:  Discontinued        500 mg 250 mL/hr over 60 Minutes Intravenous Every 24 hours 06/06/20 2044 06/08/20 1031   06/06/20 1415  cefTRIAXone (ROCEPHIN) 2 g in sodium chloride 0.9 % 100 mL IVPB        2 g 200 mL/hr over 30 Minutes Intravenous  Once 06/06/20 1407 06/06/20 1514   06/06/20 1415  azithromycin (ZITHROMAX) 500 mg in sodium chloride 0.9 % 250 mL IVPB        500 mg 250 mL/hr over 60 Minutes Intravenous  Once 06/06/20 1407 06/06/20 1708             Family Communication/Anticipated D/C date and plan/Code Status   DVT prophylaxis: SCDs Start: 06/06/20 1430 apixaban (ELIQUIS) tablet 5 mg     Code Status: Full Code  Family Communication: None Disposition Plan:    Status is: Inpatient  Remains inpatient appropriate because:Unsafe d/c plan   Dispo: The patient is from: Home              Anticipated d/c is to: SNF              Anticipated d/c date is: 3 days              Patient currently is not medically stable to d/c.           Subjective:   Interval events noted.  She has no complaints.  She feels better today.  Objective:    Vitals:   07/05/20 0008 07/05/20 0140 07/05/20 0343 07/05/20 0757  BP: 120/79  124/89 116/76  Pulse: 85  96 (!) 101  Resp: '16  16 17  ' Temp: 98.3 F (36.8 C)  99.5 F (37.5 C) 98.7 F (37.1 C)  TempSrc:      SpO2: 94%  98% 100%  Weight:  55 kg    Height:       No data found.   Intake/Output Summary (Last 24 hours) at 07/05/2020 1141 Last data filed at 07/05/2020 1020 Gross per 24 hour  Intake 75 ml  Output 300 ml  Net -225 ml   Filed Weights   07/02/20 0620 07/04/20 0145 07/05/20 0140  Weight: 55 kg 55.4 kg 55 kg    Exam:   GEN: NAD SKIN: Warm and dry EYES: No pallor or icterus ENT: MMM, tracheostomy CV: RRR PULM: CTA B ABD: soft, ND, NT, +BS, + PEG tube CNS: AAO x 3, non focal EXT: No edema or  tenderness             Data Reviewed:   I have personally reviewed following labs and imaging studies:  Labs: Labs show the following:   Basic Metabolic Panel:  Recent Labs  Lab 06/29/20 0255 06/30/20 0941 07/01/20 0510 07/02/20 0441 07/03/20 0438 07/04/20 0516 07/05/20 1022  NA 142 141 140  --   --   --   --   K 4.3 4.4 4.2  --   --   --   --   CL 91* 93* 93*  --   --   --   --   CO2 41* 40* 38*  --   --   --   --   GLUCOSE 99 105* 101*  --   --   --   --   BUN '20 19 20  ' --   --   --   --   CREATININE 0.56 0.48 0.53  --   --   --   --   CALCIUM 9.0 9.0 9.0  --   --   --   --   MG 2.0 2.1 2.1 2.0 2.0 2.0 1.8  PHOS 5.2* 4.2 4.1 4.7* 4.4 5.6* 4.9*   GFR Estimated Creatinine Clearance: 52.5 mL/min (by C-G formula based on SCr of 0.53 mg/dL). Liver Function Tests: No results for input(s): AST, ALT, ALKPHOS, BILITOT, PROT, ALBUMIN in the last 168 hours. No results for input(s): LIPASE, AMYLASE in the last 168 hours. No results for input(s): AMMONIA in the last 168 hours. Coagulation profile No results for input(s): INR, PROTIME in the last 168 hours.  CBC: Recent Labs  Lab 06/29/20 0255 06/30/20 0941 07/01/20 0510 07/02/20 0441 07/03/20 0438  WBC 7.7 7.3 7.1 7.1 6.5  HGB 8.5* 8.7* 8.5* 7.7* 8.1*  HCT 28.8* 28.9* 27.8* 25.8* 26.5*  MCV 99.7 98.6 97.5 97.4 97.8  PLT 317 369 394 376 361   Cardiac Enzymes: No results for input(s): CKTOTAL, CKMB, CKMBINDEX, TROPONINI in the last 168 hours. BNP (last 3 results) No results for input(s): PROBNP in the last 8760 hours. CBG: Recent Labs  Lab 07/04/20 1552 07/04/20 1944 07/05/20 0007 07/05/20 0341 07/05/20 0758  GLUCAP 71 107* 102* 94 101*   D-Dimer: No results for input(s): DDIMER in the last 72 hours. Hgb A1c: No results for input(s): HGBA1C in the last 72 hours. Lipid Profile: No results for input(s): CHOL, HDL, LDLCALC, TRIG, CHOLHDL, LDLDIRECT in the last 72 hours. Thyroid function studies: No  results for input(s): TSH, T4TOTAL, T3FREE, THYROIDAB in the last 72 hours.  Invalid input(s): FREET3 Anemia work up: No results for input(s): VITAMINB12, FOLATE, FERRITIN, TIBC, IRON, RETICCTPCT in the last 72 hours. Sepsis Labs: Recent Labs  Lab 06/30/20 0941 07/01/20 0510 07/02/20 0441 07/03/20 0438  WBC 7.3 7.1 7.1 6.5    Microbiology No results found for this or any previous visit (from the past 240 hour(s)).  Procedures and diagnostic studies:  No results found.             LOS: 29 days   [place] Copywriter, advertising on [email or web address]. If 7PM-7AM, please contact night-coverage at [email or web address]     07/05/2020, 11:41 AM

## 2020-07-05 NOTE — TOC Progression Note (Signed)
Transition of Care [facility]) - Progression Note    Patient Details  Name: Virginia [name] MRN: [id number] Date of Birth: 09/28/1950  [facility]) CM/SW Contact  Margarito Liner, LCSW Phone Number: 07/05/2020, 10:34 AM  Clinical Narrative: Received call from [facility] admissions coordinator. Insurance has sent appeal information to third-party reviewer, [employer]. [employer] has 72 hours to make a decision but said they often do not overturn denials. If patient's condition changes, [facility] can start a new authorization.      Barriers to Discharge: Continued Medical Work up  Expected Discharge Plan and Services   In-house Referral: Clinical Social Work,Hospice / Palliative Care   Post Acute Care Choice: Durable Medical Equipment (Oxygen 4L) Living arrangements for the past 2 months: Apartment                                       Social Determinants of Health (SDOH) Interventions    Readmission Risk Interventions No flowsheet data found.

## 2020-07-05 NOTE — Progress Notes (Signed)
Speech Language Pathology Treatment: [name] Speaking valve  Patient Details Name: Virginia [name] MRN: [id number] DOB: [birth year] Today's Date: 07/05/2020 Time: 1615-1700 SLP Time Calculation (min) (ACUTE ONLY): 45 min  Assessment / Plan / Recommendation Clinical Impression  Pt seen today for ongoing assessment of toleration of PMV wear; education about use/care. Pt resting in bed w/ min SOB noted at rest. Vocal quality seemed min improved w/ min increased energy and breath support. She and PT reported SOB and decreased O2 sats during session today; increased WOB. PMV was in place upon entering room -- cuff required min air to be removed. Pt awake, verbal communication. Pt alert w/ good eye contact and communication but endorses min SOB and anxiety re: her breathing/recovery. She has successfully weaned from Vent to [facility] -- tolerating full TC wear. Pt has a Shiley size 6, cuffed w/ cuff Deflated at Baseline. O2 sats 99-100%, RR 20, HR 91. PMV use and placement was explained to pt again -- it is placed on the outside of the trach; mirror utilized to see this. Pt to continue to practice w/ SLP next session. Discussed pt's need to maintain a calm breathing pattern and monitor her anxiety informing staff/therapies if becoming too anxious or SOB and needing a Rest Break from wearing it, especially during PT sessions. Pt has had a Tracheostomy w/ PMV in past so she was given cues/reminders of her success in weaning then. PMV remained placed w/ vocal quality w/ min+ improvement from last sessions. Min improved volume and effort of speech w/ less exertion noted during conversation. She engaged in short conversation w/ SLP. She stated no overt difficulty w/ exhalation or discomfort breathing at rest. No pain endorsed. Intelligibility was adequate in a quiet setting. Pt was encouraged to lie quietly and just focus on her breathing w/ the PMV placed but if becoming drowsy/sleepy, then have NSG remove  it. Oral care given to moisten mouth. Discussed her precautions; posted on wall in room. Pt was able to maintain adequate breathing w/ tolerance of wearing the PMV for verbal communication w/ SLP for ~45 mins w/out desat or discomfort(she had been wearing throughout the day already). ST services will f/u per POC to provide opportunites and education for pt to increase comfort w/ PMV placement during conversation and communication in ADLs w/ others, and care of the PMV. NSG updated. Precautions posted in room. Pt is encouraged to wear the PMV during the Day but Remove when sleepy or needing to Rest; she MUST wear it during ANY oral intake (currently on a clear liquids diet w/ PEG TFs). She may wear it during all therapies if not SOB/WOB or any discomfort. PMV wear must be Supervised if wearing during PT/OT tx sessions for fatigue. Pt agreed w/ POC.     HPI HPI: Pt is a 70 yo female with severe COPD admitted with acute on chronic hypoxic hypercapnic respiratory failure and acute encephalopathy secondary to narcotic use and AECOPD requiring mechanical intubation. Pt received both Tracheostomy and PEG placement. She is currently weaning from vent to TC. Per pt report, this is pt's Second time w/ a Tracheostomy and PEG.  She was recently hospitalized from 12/10 to 12/12 following treatment of AECOPD.  She presented back to [facility] ER on 12/14 via EMS with altered mental status, cough, worsening shortness of breath, and wheezing. Pts daughter reports she has been taking narcotics along with seroquel. However, due to lack of improvement of mentation at admit, she required mechanical intubation. COVID-19/Influenza  PCR and CXR negative. She was subsequently admitted to ICU for additional workup and treatment.      SLP Plan  Continue with current plan of care       Recommendations  Diet recommendations:  (clear liquids) Liquids provided via: Cup;Straw (monitor) Medication Administration: Via alternative means  (PEG) Supervision: Patient able to self feed;Intermittent supervision to cue for compensatory strategies Compensations: Minimize environmental distractions;Slow rate;Small sips/bites (MUST wear the PMV during all po intake) Postural Changes and/or Swallow Maneuvers: Seated upright 90 degrees;Upright 30-60 min after meal      Patient may use Passy-Muir Speech Valve: During all waking hours (remove during sleep);During PO intake/meals;During all therapies with supervision PMSV Supervision: Intermittent MD: Please consider changing trach tube to :  (TBD)         Oral Care Recommendations: Oral care BID;Oral care before and after PO;Patient independent with oral care Follow up Recommendations:  (TBD) SLP Visit Diagnosis: Aphonia (R49.1) (tracheostomy) Plan: Continue with current plan of care       GO                 Jerilynn Som, MS, CCC-SLP Speech Language Pathologist Rehab Services [phone] [facility] 07/05/2020, 5:24 PM

## 2020-07-05 NOTE — Progress Notes (Signed)
In bed, trach mask in place, comfortable, sleeping on arrival to the room. No new complaints, pt/oy in progress. Has a peg tube. No worsening dyspnea or wheezing. LTAC denied.    Assessment & Plan:   Active Problems:   Respiratory failure (HCC)   COPD exacerbation (HCC)  Acute on chronic hypoxemic and hypercapnic respiratory failureoffventilator,s/p tracheostomy.respiratory statusimproved over the weak end.  Dysphagia, moderate protein calorie malnutrition- now s/p PEG  Severe baseline COPD- complicated trach wean  Muscular deconditioning  Chronic anxiety/depression  -continueduonebs from duonebs to brovana/duo nebsi - PT/OT - Continue standing clonazepam, use valium per tube for breakthrough -avoid heavy sedation -less sputum on gycopyrrolate, no clogging -mag, phos in am -since not on vent, looking better each day, skill care may be appropriate for her. Told she has to stay here x 30 days to be eligible, will discuss with social worker   Objective:       Vitals:   07/04/20 0546 07/05/20    BP: 108/79 120/79    Pulse: 78 85    Resp: 16 16    Temp: 98.2 F (36.8 C) 98.2    TempSrc:      SpO2: 92% 94-100%    Weight:      Height:        Intake/Output Summary (Last 24 hours) at 07/04/2020 1703 Last data filed at 07/04/2020 1135    Gross per 24 hour  Intake 0 ml  Output 1300 ml  Net -1300 ml        Filed Weights   07/01/20 0500 07/02/20 0620 07/04/20 0145  Weight: 54.3 kg 55 kg 55.4 kg    Examination:  General exam: Appears calm and comfortable, smiling, fully understanding the plan HEENT: /nt, no juandice Neck: supple, no stridr, trach site clean Respiratory system: Clear to auscultation. Respiratory effort normal. Cardiovascular system: S1 & S2 heard, RRR. No JVD, murmurs, rubs, gallops or clicks. No pedal edema. Gastrointestinal system: Abdomen is nondistended, soft and nontender. No organomegaly or masses felt. Normal bowel  sounds heard. Central nervous system: Alert and oriented. No focal neurological deficits. Extremities: Symmetric 5 x 5 power. Skin: No rashes, lesions or ulcers Psychiatry: Judgement and insight appear normal. Mood & affect appropriate.     Data Reviewed: I have personally reviewed following labs and imaging studies  CBC: Last Labs           Recent Labs  Lab 06/28/20 0500 06/29/20 0255 06/30/20 0941 07/01/20 0510 07/02/20 0441 07/03/20 0438  WBC 7.9 7.7 7.3 7.1 7.1 6.5  NEUTROABS 5.3  --   --   --   --   --   HGB 8.4* 8.5* 8.7* 8.5* 7.7* 8.1*  HCT 28.1* 28.8* 28.9* 27.8* 25.8* 26.5*  MCV 98.6 99.7 98.6 97.5 97.4 97.8  PLT 316 317 369 394 376 361     Basic Metabolic Panel: Last Labs            Recent Labs  Lab 06/28/20 0500 06/29/20 0255 06/30/20 0941 07/01/20 0510 07/02/20 0441 07/03/20 0438 07/04/20 0516  NA 143 142 141 140  --   --   --   K 4.3 4.3 4.4 4.2  --   --   --   CL 95* 91* 93* 93*  --   --   --   CO2 40* 41* 40* 38*  --   --   --   GLUCOSE 105* 99 105* 101*  --   --   --   BUN  '15 20 19 20  ' --   --   --   CREATININE 0.53 0.56 0.48 0.53  --   --   --   CALCIUM 9.5 9.0 9.0 9.0  --   --   --   MG 1.9 2.0 2.1 2.1 2.0 2.0 2.0  PHOS 5.3* 5.2* 4.2 4.1 4.7* 4.4 5.6*     CBG: Last Labs          Recent Labs  Lab 07/04/20 0039 07/04/20 0416 07/04/20 0848 07/04/20 1229 07/04/20 1552  GLUCAP 93 85 85 104* 71     Urine analysis: Labs (Brief)          Component Value Date/Time   COLORURINE STRAW (A) 02/20/2017 1116   APPEARANCEUR CLEAR (A) 02/20/2017 1116   LABSPEC 1.004 (L) 02/20/2017 1116   PHURINE 7.0 02/20/2017 1116   GLUCOSEU NEGATIVE 02/20/2017 1116   [place] 02/20/2017 1116   [place] 02/20/2017 1116   [place] 02/20/2017 1116   PROTEINUR NEGATIVE 02/20/2017 1116   NITRITE NEGATIVE 02/20/2017 1116   LEUKOCYTESUR NEGATIVE 02/20/2017 1116     Sepsis Labs: '@Radiology'  Studies: Imaging  Results (Last 48 hours)  No results found.   Scheduled Meds: . sodium chloride   Intravenous Once  . apixaban  5 mg Oral BID  . arformoterol  15 mcg Nebulization BID  . chlorhexidine gluconate (MEDLINE KIT)  15 mL Mouth Rinse BID  . Chlorhexidine Gluconate Cloth  6 each Topical Daily  . clonazePAM  1 mg Per Tube BID  . feeding supplement (OSMOLITE 1.2 CAL)  237 mL Per Tube 6 X Daily  . feeding supplement (PROSource TF)  45 mL Per Tube Daily  . fentaNYL  1 patch Transdermal Q72H  . free water  60 mL Per Tube 6 X Daily  . gabapentin  300 mg Per Tube TID  . glycopyrrolate  1 mg Per Tube BID  . insulin aspart  0-6 Units Subcutaneous Q4H  . mouth rinse  15 mL Mouth Rinse 10 times per day  . metoprolol tartrate  50 mg Per Tube BID  . pantoprazole (PROTONIX) IV  40 mg Intravenous QHS  . QUEtiapine  25 mg Per Tube QHS  . sodium chloride flush  10-40 mL Intracatheter Q12H  . sodium chloride flush  3 mL Intravenous Q12H   Continuous Infusions: . sodium chloride Stopped (06/25/20 1842)  . sodium chloride       LOS: 28 days    Time spent: 41 min    Wallene Huh, MD 07/05/2020

## 2020-07-05 NOTE — Progress Notes (Signed)
Physical Therapy Treatment Patient Details Name: Virginia [name] MRN: [id number] DOB: [birth year] Today's Date: 07/05/2020    History of Present Illness 70 yo female with severe COPD admitted with acute on chronic hypoxic hypercapnic respiratory failure and acute encephalopathy secondary to narcotic use and AECOPD requiring mechanical intubation. Initially intubated from 12/14-12/16 and then again from 12/18-12/23. Now with + trach collar. Recent hospitalization from 12/10-12/12/21.    PT Comments    Therapist in to see pt this am. Pt long sitting in bed on 35% FiO2 at 8L O2 with PMV in place.  Pt noted to be SOB with sats at 61%. Nursing notified RT who came to assess pt and remove PMV, O2 sats increased to 84%, pt suctioned with further improved results.  Session focused on assisting pt with upright posture and educating on PLB technique. Pt required repeated verbal and visual cues to complete.  Will discuss pt with ST regarding possible reassessment needed.   Follow Up Recommendations  SNF     Equipment Recommendations       Recommendations for Other Services       Precautions / Restrictions Precautions Precautions: Fall    Mobility  Bed Mobility Overal bed mobility: Needs Assistance Bed Mobility: Rolling     Supine to sit: Min guard        Transfers                    Ambulation/Gait                 Stairs             Wheelchair Mobility    Modified Rankin (Stroke Patients Only)       Balance                                            Cognition Arousal/Alertness: Awake/alert Behavior During Therapy: Anxious                                   General Comments: repeated verbal and visual cues for PLB technique      Exercises      General Comments General comments (skin integrity, edema, etc.):  (Session focused on education. Pt found in room with PMV on 35% FiO2, 8L O2 with sats at 61%. Pt  trach mask repositioned for beneficial results and pt educated on proper breathing techniques until Nursing/RT arrived. Pt able to increase to 74% with vc's.)      Pertinent Vitals/Pain Pain Assessment: No/denies pain    Home Living                      Prior Function            PT Goals (current goals can now be found in the care plan section) Acute Rehab PT Goals Patient Stated Goal: To return home safely    Frequency    Min 2X/week      PT Plan Current plan remains appropriate    Co-evaluation              AM-PAC PT "6 Clicks" Mobility   Outcome Measure  Help needed turning from your back to your side while in a flat bed without using bedrails?: A Little Help needed moving from lying  on your back to sitting on the side of a flat bed without using bedrails?: A Little Help needed moving to and from a bed to a chair (including a wheelchair)?: A Lot Help needed standing up from a chair using your arms (e.g., wheelchair or bedside chair)?: A Lot Help needed to walk in hospital room?: Total Help needed climbing 3-5 steps with a railing? : Total 6 Click Score: 12    End of Session Equipment Utilized During Treatment: Oxygen (35% on 8L trach mask)   Patient left: in bed;with call bell/phone within reach;with bed alarm set (RT attending to pt.) Nurse Communication: Other (comment) (Drop in O2 sats with PMV) PT Visit Diagnosis: Muscle weakness (generalized) (M62.81);Difficulty in walking, not elsewhere classified (R26.2)     Time: 2458-0998 PT Time Calculation (min) (ACUTE ONLY): 20 min  Charges:  $Therapeutic Activity: 8-22 mins                     Zadie Cleverly, PTA   Jannet Askew 07/05/2020, 2:08 PM

## 2020-07-05 NOTE — Progress Notes (Signed)
suctionned for moderate amt. Thick brownish secretions.Pt. tolerated well.

## 2020-07-05 NOTE — Progress Notes (Signed)
Nutrition Brief Note  Met with patient at bedside today to see how she was tolerating new Osmolite 1.2 Cal (fiber-free formula). See RD follow-up note from yesterday (1/11 for new prescription). She reported to RD she was tolerating, however, per documentation in chart and discussion with RN patient has refused all feedings so far today. Discussed that this is a new formula that does not have any fiber and strongly encouraged patient to try new formula. Sent secure chat to MD regarding patient's refusal of tube feeds. May need to consider transitioning patient back to continuous tube feeds or provide tube feeds nocturnally. RN reports she will continue to encourage patient to accept bolus feeds today. Will continue to monitor.  Jacklynn Barnacle, MS, RD, LDN Pager number available on Amion

## 2020-07-06 ENCOUNTER — Inpatient Hospital Stay: Payer: Medicare Other

## 2020-07-06 DIAGNOSIS — J9621 Acute and chronic respiratory failure with hypoxia: Secondary | ICD-10-CM

## 2020-07-06 DIAGNOSIS — R338 Other retention of urine: Secondary | ICD-10-CM

## 2020-07-06 DIAGNOSIS — I480 Paroxysmal atrial fibrillation: Secondary | ICD-10-CM

## 2020-07-06 DIAGNOSIS — G9341 Metabolic encephalopathy: Secondary | ICD-10-CM

## 2020-07-06 DIAGNOSIS — J9622 Acute and chronic respiratory failure with hypercapnia: Secondary | ICD-10-CM | POA: Diagnosis not present

## 2020-07-06 LAB — PHOSPHORUS: Phosphorus: 4.5 mg/dL (ref 2.5–4.6)

## 2020-07-06 LAB — MAGNESIUM: Magnesium: 1.9 mg/dL (ref 1.7–2.4)

## 2020-07-06 LAB — GLUCOSE, CAPILLARY
Glucose-Capillary: 91 mg/dL (ref 70–99)
Glucose-Capillary: 80 mg/dL (ref 70–99)
Glucose-Capillary: 87 mg/dL (ref 70–99)
Glucose-Capillary: 91 mg/dL (ref 70–99)
Glucose-Capillary: 109 mg/dL — ABNORMAL HIGH (ref 70–99)
Glucose-Capillary: 122 mg/dL — ABNORMAL HIGH (ref 70–99)

## 2020-07-06 MED ORDER — PIPERACILLIN-TAZOBACTAM 3.375 G IVPB
3.3750 g | Freq: Three times a day (TID) | INTRAVENOUS | Status: DC
  Administered 2020-07-06 – 2020-07-08 (×6): 3.375 g via INTRAVENOUS
  Filled 2020-07-06 (×6): qty 50

## 2020-07-06 MED ORDER — APIXABAN 5 MG PO TABS
5.0000 mg | ORAL_TABLET | Freq: Two times a day (BID) | ORAL | Status: DC
  Administered 2020-07-07 – 2020-07-13 (×13): 5 mg
  Filled 2020-07-06 (×13): qty 1

## 2020-07-06 NOTE — Progress Notes (Signed)
Physical Therapy Treatment Patient Details Name: Virginia [name] MRN: [id number] DOB: Dec 04, 1950 Today's Date: 07/06/2020    History of Present Illness 70 yo female with severe COPD admitted with acute on chronic hypoxic hypercapnic respiratory failure and acute encephalopathy secondary to narcotic use and AECOPD requiring mechanical intubation. Initially intubated from 12/14-12/16 and then again from 12/18-12/23. Now with + trach collar. Recent hospitalization from 12/10-12/12/21.    PT Comments    Pt stated she has OT this am and was able to stand briefly but remains fatigued this pm.  sats in mid 90's after suctioning with nursing.  She agrees to session for LE ex.  She pauses to text daughter.  X-ray in for testing.  Session stopped at this time.  Will continue at a later time and date.   Follow Up Recommendations  SNF     Equipment Recommendations       Recommendations for Other Services       Precautions / Restrictions Precautions Precautions: Fall Precaution Comments: trach Restrictions Weight Bearing Restrictions: No    Mobility  Bed Mobility Overal bed mobility: Needs Assistance Bed Mobility: Rolling;Supine to Sit;Sit to Supine Rolling: Supervision   Supine to sit: Supervision Sit to supine: Supervision   General bed mobility comments: cuing for technique and hand placement  Transfers Overall transfer level: Needs assistance Equipment used: Rolling walker (2 wheeled) Transfers: Sit to/from Stand Sit to Stand: Min assist         General transfer comment: Min A sit <>stand from EOB . Unable to weight shift encough to lift either feet off floor for side steps. O2 sats decreased to 85% on 9 L of O2  Ambulation/Gait                 Stairs             Wheelchair Mobility    Modified Rankin (Stroke Patients Only)       Balance Overall balance assessment: Needs assistance Sitting-balance support: Single extremity supported;Feet  supported Sitting balance-Leahy Scale: Fair Sitting balance - Comments: min guard   Standing balance support: Bilateral upper extremity supported Standing balance-Leahy Scale: Fair                              Cognition Arousal/Alertness: Awake/alert Behavior During Therapy: Anxious Overall Cognitive Status: Within Functional Limits for tasks assessed                                 General Comments: repeated verbal and visual cues for PLB technique      Exercises Other Exercises Other Exercises: supine ex - ankle pumps, SLR and heel slides x 10    General Comments        Pertinent Vitals/Pain Pain Assessment: No/denies pain    Home Living Family/patient expects to be discharged to:: Private residence Living Arrangements: Alone Available Help at Discharge: Family;Available PRN/intermittently Type of Home: Apartment Home Access: Level entry   Home Layout: One level Home Equipment: Emergency planning/management officer - 4 wheels      Prior Function            PT Goals (current goals can now be found in the care plan section) Acute Rehab PT Goals Patient Stated Goal: To return home safely Progress towards PT goals: Progressing toward goals    Frequency    Min 2X/week  PT Plan Current plan remains appropriate    Co-evaluation              AM-PAC PT "6 Clicks" Mobility   Outcome Measure  Help needed turning from your back to your side while in a flat bed without using bedrails?: A Little Help needed moving from lying on your back to sitting on the side of a flat bed without using bedrails?: A Little Help needed moving to and from a bed to a chair (including a wheelchair)?: A Lot Help needed standing up from a chair using your arms (e.g., wheelchair or bedside chair)?: A Lot Help needed to walk in hospital room?: Total Help needed climbing 3-5 steps with a railing? : Total 6 Click Score: 12    End of Session Equipment Utilized During  Treatment: Oxygen Activity Tolerance: Patient tolerated treatment well Patient left: in bed;with call bell/phone within reach;with bed alarm set         Time: 1241-1249 PT Time Calculation (min) (ACUTE ONLY): 8 min  Charges:  $Therapeutic Exercise: 8-22 mins                    Danielle Dess, PTA 07/06/20, 1:57 PM

## 2020-07-06 NOTE — Progress Notes (Signed)
Speech Language Pathology Treatment: [name] Speaking valve  Patient Details Name: RONIA HAZELETT MRN: [id number] DOB: Oct 31, 1950 Today's Date: 07/06/2020 Time: 3825-0539 SLP Time Calculation (min) (ACUTE ONLY): 40 min  Assessment / Plan / Recommendation Clinical Impression  Pt seen today for ongoing assessment of toleration of PMV wear; education about use/care. Pt resting in bed w/ min SOB noted at rest. PMV was not in place post removal by OT during session. Pt has another episode of low O2 sats during an OT tx session and PMV was removed. Pt and PT reported SOB and decreased O2 sats during session yesterday; increased WOB. Pt remains on TC w/ O2 support at 8-9L increased post the OT session to support pt. Pt had tracheal suctioning completed last night resulting in bloody, tracheal secretions. Min increased WOB and audible, congested inhalation/exhalations noted at rest.  SLP assessed O2 sats prior to PMV while pt was at rest/baseline. O2 sats 89-93% on the 9L O2. PMV was placed briefly for ~15 mins. Pt awake, verbal communication. She stated no overt difficulty w/ exhalation or discomfort breathing at rest but continued to note is was more "effortful today" w/ the PMV on or off.No pain endorsed. Intelligibility and volume of speech were grossly adequate in a quiet setting. Pt alert w/ good eye contact and communication but endorses min SOB and WOB. O2 sats remained 88-93% during PMV placement. Pt was encouraged toliequietly and just focus on her breathing w/the PMVplaced and focus on calm breathing w/ full inhalation/exhalation pattern -- this was modeled by SLP. When O2 sats remained low, PMV was then removed to allow to rest.  She has successfully weaned from Vent to [facility] -- tolerating full TC wear. Pt has a Shiley size 6, cuffed w/ cuff Deflated at Baseline. O2 sats have been in the upper 90s previous days post wean to TC. Pt endorsed she felt she received more "consistent"  tracheal suctioning and breathing treatments "before" moving to a regular room vs when she was in CCU. PMV use and placementwas explained to ptagain but this time the concerns of pt's WOB/SOB and breathing w/ low O2 sats all negative factors indicating inappropriateness for PMV wear currently. Pt appears to exhibit min decreased awareness and insight into the severity of her Pulmonary issues; pt has had a PEG and Tracheostomy w/ PMV in pastand weaned from dependence on both; it seems pt is expecting to do the same this time. Oral care given to moisten mouth. Discussed the plan to HOLD on PMV wear/use and the clear liquids oral diet to give her Time to rest for 2-3 days.  Although pt was able to [facility] w/ tolerance of wearing the PMV for verbal communication w/ SLP for ~15 mins w/out desat or increased discomfort, ST services does not recommend pt wearing the PMV at this time until further assessment by MD of pt's medical and Pulmonary status' is completed. Pt appears to have a decline in Pulmonary status as she is unable to tolerate wearing the PMV currently -- this is a change/decline in status from prior. Pt also has low O2 sats when NOT wearing the PMV. MD ordering a CXR. Updated MD on pt's observation that she is not receiving scheduled breathing tx and tracheal suctioning which she feels in "helpful to me" as she did before. She is also having bloody secretions post tracheal suctioning.ST services will f/u pt's status next 2-3 days for ongoing assessment of PMV tolerarance and to provide opportunites and educationfor pt to  increase comfort w/ PMV placement and wear during conversation and communication in ADLs w/ others; and also care of the PMV. Recommend frequent oral care for hygiene and stimulation of swallowing. MD/NSG updated. Pt agreed w/ POC.     HPI HPI: Pt is a 70 yo female with severe COPD admitted with acute on chronic hypoxic hypercapnic respiratory failure and acute  encephalopathy secondary to narcotic use and AECOPD requiring mechanical intubation. Pt received both Tracheostomy and PEG placement. She is currently weaning from vent to TC. Per pt report, this is pt's Second time w/ a Tracheostomy and PEG.  She was recently hospitalized from 12/10 to 12/12 following treatment of AECOPD.  She presented back to [facility] ER on 12/14 via EMS with altered mental status, cough, worsening shortness of breath, and wheezing. Pts daughter reports she has been taking narcotics along with seroquel. However, due to lack of improvement of mentation at admit, she required mechanical intubation. COVID-19/Influenza PCR and CXR negative. She was subsequently admitted to ICU for additional workup and treatment.      SLP Plan  Continue with current plan of care       Recommendations  Diet recommendations: NPO (single ice chips for pleasure post oral care) Medication Administration: Via alternative means (PEG)      Patient may use Passy-Muir Speech Valve: with SLP only PMSV Supervision: Full MD: Please consider changing trach tube to :  (cuffless tbd?)         General recommendations:  (PT/OT following) Oral Care Recommendations: Oral care QID;Patient independent with oral care (support) Follow up Recommendations: Skilled Nursing facility (TBD) SLP Visit Diagnosis: Aphonia (R49.1) Plan: Continue with current plan of care       GO                 Jerilynn Som, MS, CCC-SLP Speech Language Pathologist Rehab Services [phone] [facility] 07/06/2020, 3:08 PM

## 2020-07-06 NOTE — Progress Notes (Addendum)
PROGRESS NOTE    Virginia [name]  JKK:[id number] DOB: 05-08-1951 DOA: 06/06/2020 PCP: Idelle Crouch, MD   Chief complaint. Shortness of breath Brief Narrative:  Virginia [name] is a 70 y.o. female severe COPD admitted with acute on chronic hypoxic hypercapnic respiratory failure and acute encephalopathy secondary to narcotic use and AECOPD requiring mechanical intubation.  She was treated with steroids and empiric IV antibiotics.  She could not be liberated from the ventilator so tracheostomy was performed and she has been receiving oxygen via trach collar.  Now s/p trach/PEG working on trach weaning.   Assessment & Plan:   Active Problems:   Respiratory failure (HCC)   COPD exacerbation ([place])  #1. Acute on chronic respiratory failure with hypoxemia and hypercapnia. COPD exacerbation. Status post tracheostomy and a PEG tube placement.  Patient is still requiring oxygen over the trach collar. She is also receiving tube feeding in addition to oral intake. She has been seen by speech therapy. Currently she is obtaining for LTAC transfer.  2. Dysphagia. Moderate protein calorie malnutrition. Deconditioning. Continue PT and OT.  3. Acute metabolic encephalopathy. Condition has back to baseline.  4. Paroxysmal atrial fibrillation per Currently on Eliquis and metoprolol.  Addendum: 1430.  Patient seems to have worsening hypoxemia this afternoon.  Check x-ray showed bilateral lower lobe pneumonia.  Patient  developed aspiration pneumonia.  Started Zosyn.   DVT prophylaxis: Eliquis Code Status: Full Family Communication:  Disposition Plan:  .   Status is: Inpatient  Remains inpatient appropriate because:Unsafe d/c plan   Dispo: The patient is from: Home              Anticipated d/c is to: LTAC              Anticipated d/c date is: > 3 days              Patient currently is medically stable to d/c.        I/O last 3 completed shifts: In: 335  [Other:335] Out: 300 [Urine:300] No intake/output data recorded.     Consultants:   None  Procedures: Tracheostomy and PEG tube  Antimicrobials: None  Subjective: Patient is a relatively stable. She is still on oxygen over trach collar. She does not have significant shortness of breath. She is able to eat small amount of meal. She does not have nausea vomiting abdominal pain. No fever or chills. No dysuria or hematuria.  Objective: Vitals:   07/06/20 0037 07/06/20 0502 07/06/20 0609 07/06/20 0750  BP: 120/82 120/80  118/78  Pulse: 80 79  88  Resp: '16 16  19  ' Temp: 98.1 F (36.7 C) 98.5 F (36.9 C)  98.6 F (37 C)  TempSrc:    Oral  SpO2: 100% 100%  94%  Weight:   50.3 kg   Height:        Intake/Output Summary (Last 24 hours) at 07/06/2020 1019 Last data filed at 07/06/2020 7169 Gross per 24 hour  Intake 260 ml  Output --  Net 260 ml   Filed Weights   07/04/20 0145 07/05/20 0140 07/06/20 0609  Weight: 55.4 kg 55 kg 50.3 kg    Examination:  General exam: Appears calm and comfortable, appears malnourished Respiratory system: Decreased the breathing sounds. Respiratory effort normal. Cardiovascular system: S1 & S2 heard, RRR. No JVD, murmurs, rubs, gallops or clicks. No pedal edema. Gastrointestinal system: Abdomen is nondistended, soft and nontender. No organomegaly or masses felt. Normal bowel sounds heard. Central nervous system:  Alert and oriented. No focal neurological deficits. Extremities: Symmetric  Skin: No rashes, lesions or ulcers Psychiatry:  Mood & affect appropriate.     Data Reviewed: I have personally reviewed following labs and imaging studies  CBC: Recent Labs  Lab 06/30/20 0941 07/01/20 0510 07/02/20 0441 07/03/20 0438  WBC 7.3 7.1 7.1 6.5  HGB 8.7* 8.5* 7.7* 8.1*  HCT 28.9* 27.8* 25.8* 26.5*  MCV 98.6 97.5 97.4 97.8  PLT 369 394 376 983   Basic Metabolic Panel: Recent Labs  Lab 06/30/20 0941 07/01/20 0510 07/02/20 0441  07/03/20 0438 07/04/20 0516 07/05/20 1022 07/06/20 0142  NA 141 140  --   --   --   --   --   K 4.4 4.2  --   --   --   --   --   CL 93* 93*  --   --   --   --   --   CO2 40* 38*  --   --   --   --   --   GLUCOSE 105* 101*  --   --   --   --   --   BUN 19 20  --   --   --   --   --   CREATININE 0.48 0.53  --   --   --   --   --   CALCIUM 9.0 9.0  --   --   --   --   --   MG 2.1 2.1 2.0 2.0 2.0 1.8 1.9  PHOS 4.2 4.1 4.7* 4.4 5.6* 4.9* 4.5   GFR: Estimated Creatinine Clearance: 52.5 mL/min (by C-G formula based on SCr of 0.53 mg/dL). Liver Function Tests: No results for input(s): AST, ALT, ALKPHOS, BILITOT, PROT, ALBUMIN in the last 168 hours. No results for input(s): LIPASE, AMYLASE in the last 168 hours. No results for input(s): AMMONIA in the last 168 hours. Coagulation Profile: No results for input(s): INR, PROTIME in the last 168 hours. Cardiac Enzymes: No results for input(s): CKTOTAL, CKMB, CKMBINDEX, TROPONINI in the last 168 hours. BNP (last 3 results) No results for input(s): PROBNP in the last 8760 hours. HbA1C: No results for input(s): HGBA1C in the last 72 hours. CBG: Recent Labs  Lab 07/05/20 1558 07/05/20 2020 07/06/20 0147 07/06/20 0412 07/06/20 0748  GLUCAP 94 78 91 80 87   Lipid Profile: No results for input(s): CHOL, HDL, LDLCALC, TRIG, CHOLHDL, LDLDIRECT in the last 72 hours. Thyroid Function Tests: No results for input(s): TSH, T4TOTAL, FREET4, T3FREE, THYROIDAB in the last 72 hours. Anemia Panel: No results for input(s): VITAMINB12, FOLATE, FERRITIN, TIBC, IRON, RETICCTPCT in the last 72 hours. Sepsis Labs: No results for input(s): PROCALCITON, LATICACIDVEN in the last 168 hours.  No results found for this or any previous visit (from the past 240 hour(s)).       Radiology Studies: No results found.      Scheduled Meds: . sodium chloride   Intravenous Once  . apixaban  5 mg Oral BID  . arformoterol  15 mcg Nebulization BID  .  chlorhexidine gluconate (MEDLINE KIT)  15 mL Mouth Rinse BID  . Chlorhexidine Gluconate Cloth  6 each Topical Daily  . clonazePAM  1 mg Per Tube BID  . feeding supplement (OSMOLITE 1.2 CAL)  237 mL Per Tube 6 X Daily  . feeding supplement (PROSource TF)  45 mL Per Tube Daily  . fentaNYL  1 patch Transdermal Q72H  . free water  60 mL Per Tube 6 X Daily  . gabapentin  300 mg Per Tube TID  . glycopyrrolate  1 mg Per Tube BID  . insulin aspart  0-6 Units Subcutaneous Q4H  . metoprolol tartrate  50 mg Per Tube BID  . pantoprazole (PROTONIX) IV  40 mg Intravenous QHS  . QUEtiapine  25 mg Per Tube QHS  . sodium chloride flush  10-40 mL Intracatheter Q12H  . sodium chloride flush  3 mL Intravenous Q12H   Continuous Infusions: . sodium chloride Stopped (06/25/20 1842)  . sodium chloride       LOS: 30 days    Time spent: 27 minutes    Sharen Hones, MD [facility]   To contact the attending provider between 7A-7P or the covering provider during after hours 7P-7A, please log into the web site [email or web address] and access using universal [place] password for that web site. If you do not have the password, please call the hospital operator.  07/06/2020, 10:19 AM

## 2020-07-06 NOTE — Care Management Important Message (Signed)
Important Message  Patient Details  Name: Virginia [name] MRN: [id number] Date of Birth: 1950-08-16   Medicare Important Message Given:  Yes     [name] A Yohanna Tow 07/06/2020, 10:45 AM

## 2020-07-06 NOTE — Progress Notes (Signed)
RT suctionned pt. For mod amt. Of thick bloody secretions. Pt. Tolerated well.

## 2020-07-06 NOTE — Progress Notes (Signed)
Nutrition Follow-up  DOCUMENTATION CODES:   Not applicable  INTERVENTION:  Continue Osmolite 1.2 Cal 1 carton (237 mL) 6 times per day per G-tube + PROSource TF 45 mL once daily per tube. Provides 1750 kcal, 90 grams of protein, 1170 mL H2O daily.  Flush tube with 30 mL before and after each bolus feeding. Provides a total of 1530 mL H2O daily including water in tube feed regimen.  If patient continues to refuse tube feeds recommend palliative care consult for discussion regarding goals of care.   NUTRITION DIAGNOSIS:   Increased nutrient needs related to catabolic illness (COPD) as evidenced by estimated needs.  Ongoing.  GOAL:   Patient will meet greater than or equal to 90% of their needs  Not met as patient is refusing tube feeds.  MONITOR:   PO intake,Diet advancement,TF tolerance,Labs,Weight trends  REASON FOR ASSESSMENT:   Ventilator,Consult Assessment of nutrition requirement/status,Enteral/tube feeding initiation and management  ASSESSMENT:   70 year old female with PMHx of COPD, hx tracheostomy tube placement and PEG tube placement in 2017, HTN, depression, RLS, osteoporosis, hepatitis C, A-fib, anxiety, arthritis admitted with acute on chronic hypoxic hypercapnic respiratory failure and acute encephalopathy secondary to narcotic use and acute exacerbation of COPD.  12/14 intubated 12/16 extubated 12/18 reintubated and TFs restarted 12/23 s/p tracheostomy tube placement 12/23 Dobbhoff tube was placed after patient returned from trach and TFs were restarted 12/25 pt pulled out Dobbhoff tube and refused replacement 12/29 s/p PEG tube placement 1/5 pt changed to bolus regimen with Jevity 1.2 Cal  1/11 pt changed to bolus regimen with Osmolite 1.2 Cal  Met with patient at bedside again today. Patient continued to refuse tube feeds last night. Again discussed importance of patient receiving tube feeds as it is her sole source of nutrition at this point. Patient  was previously allowed to take sips of clear liquids but is now being made NPO today per SLP. Patient reports her "mind isn't right" and she doesn't even remember refusing the tube feeds or our conversation yesterday. She said to "not listen to her when she says no and to give her the tube feeds." Discussed with patient that if she is refusing to RN they will likely not give her the tube feeds. Encouraged her to try the new tube feeds without fiber and see how she tolerates. Patient reports she will try them today and RD discussed with RN. RD offered to change patient back to continuous vs nocturnal tube feeds but she is refusing at this time.  Enteral Access: 20 Fr. Endovive Safety G-tube  Medications reviewed and include: fentanyl patch, glycopyrrolate, Novolog 0-6 units Q4hrs, Protonix.  Labs reviewed: CBG 80-91.  Weight trend: 50.3 kg on 1/13; -8.8 kg from 12/16  Sent RN and MD secure chat regarding patient's refusal of tube feeds and our conversation today.  Diet Order:   Diet Order            Diet NPO time specified  Diet effective now                EDUCATION NEEDS:   No education needs have been identified at this time  Skin:  Skin Assessment: Reviewed RN Assessment (MASD bilateral groin) Skin Integrity Issues:: Incisions Incisions: closed incision to neck  Last BM:  07/01/2020  Height:   Ht Readings from Last 1 Encounters:  06/06/20 5' 2.01" (1.575 m)   Weight:   Wt Readings from Last 1 Encounters:  07/06/20 50.3 kg   Ideal  Body Weight:  50 kg  BMI:  Body mass index is 20.28 kg/m.  Estimated Nutritional Needs:   Kcal:  1500-1750  Protein:  85-95 grams  Fluid:  1.5-1.7 L/day  Jacklynn Barnacle, MS, RD, LDN Pager number available on Amion

## 2020-07-06 NOTE — Progress Notes (Signed)
Occupational Therapy RE-Evaluation Patient Details Name: Virginia [name] MRN: [id number] DOB: Jul 20, 1950 Today's Date: 07/06/2020    History of Present Illness 70 yo female with severe COPD admitted with acute on chronic hypoxic hypercapnic respiratory failure and acute encephalopathy secondary to narcotic use and AECOPD requiring mechanical intubation. Initially intubated from 12/14-12/16 and then again from 12/18-12/23. Now with + trach collar. Recent hospitalization from 12/10-12/12/21.   Clinical Impression    Upon entering the room, pt supine in bed with PMSV donned with pt reporting SOB. Pt's O2 desaturation of 79% on 8 Ls. PMSV removed and pt needing to be increased to 9L for O2 saturation of 88-92%. Pt agreeable to OT intervention. Pt performed bed mobility with supervision to EOB. Pt washing UB and LB while seated with lateral leans and figure four position. Set up A to don hospital gown after self care. Pt needing cuing throughout for rest breaks to address cardiopulmonary status. Pt standing x1 from bed with min A and O2 saturation dropping to mid 80's. Pt unable to weight shift and take steps but no knee buckling noted. Pt returns to bed with supervision for sit >supine. Pt continues to benefit from OT intervention with goals being upgraded to supervision. RN and SLP notified of O2 with PMSV. All needs within reach as therapist exits the room.     Follow Up Recommendations  SNF    Equipment Recommendations  Other (comment) (defer to next venue of care)       Precautions / Restrictions Precautions Precautions: Fall Precaution Comments: trach      Mobility Bed Mobility Overal bed mobility: Needs Assistance Bed Mobility: Rolling;Supine to Sit;Sit to Supine Rolling: Supervision   Supine to sit: Supervision Sit to supine: Supervision   General bed mobility comments: cuing for technique and hand placement    Transfers Overall transfer level: Needs assistance Equipment  used: Rolling walker (2 wheeled) Transfers: Sit to/from Stand Sit to Stand: Min assist         General transfer comment: Min A sit <>stand from EOB . Unable to weight shift encough to lift either feet off floor for side steps. O2 sats decreased to 85% on 9 L of O2    Balance Overall balance assessment: Needs assistance Sitting-balance support: Single extremity supported;Feet supported Sitting balance-Leahy Scale: Fair Sitting balance - Comments: min guard   Standing balance support: Bilateral upper extremity supported Standing balance-Leahy Scale: Fair              ADL either performed or assessed with clinical judgement   ADL Overall ADL's : Needs assistance/impaired         Upper Body Bathing: Set up;Min guard;Sitting   Lower Body Bathing: Sitting/lateral leans;Min guard   Upper Body Dressing : Set up;Sitting Upper Body Dressing Details (indicate cue type and reason): hospital gown Lower Body Dressing: Min guard                 General ADL Comments: Pt needing cuing for rest breaks secondary to desaturation with functional task while seating EOB     Vision Patient Visual Report: No change from baseline              Pertinent Vitals/Pain Pain Assessment: No/denies pain              Cognition Arousal/Alertness: Awake/alert Behavior During Therapy: Anxious Overall Cognitive Status: Within Functional Limits for tasks assessed  General Comments: repeated verbal and visual cues for PLB technique              Home Living Family/patient expects to be discharged to:: Private residence Living Arrangements: Alone Available Help at Discharge: Family;Available PRN/intermittently Type of Home: Apartment Home Access: Level entry     Home Layout: One level     Bathroom Shower/Tub: Tub/shower unit         Home Equipment: Emergency planning/management officer - 4 wheels                            OT  Goals(Current goals can be found in the care plan section) Acute Rehab OT Goals Patient Stated Goal: To return home safely OT Goal Formulation: With patient Time For Goal Achievement: 07/20/20 Potential to Achieve Goals: Good ADL Goals Pt Will Perform Grooming: with modified independence;sitting Pt Will Perform Lower Body Dressing: sit to/from stand;with supervision Pt Will Transfer to Toilet: with supervision;ambulating;bedside commode  OT Frequency: Min 1X/week    AM-PAC OT "6 Clicks" Daily Activity     Outcome Measure Help from another person eating meals?: A Little Help from another person taking care of personal grooming?: A Little Help from another person toileting, which includes using toliet, bedpan, or urinal?: A Little Help from another person bathing (including washing, rinsing, drying)?: A Little Help from another person to put on and taking off regular upper body clothing?: A Little Help from another person to put on and taking off regular lower body clothing?: A Little 6 Click Score: 18   End of Session Equipment Utilized During Treatment: Oxygen;Other (comment) (9 L trach collar) Nurse Communication: Mobility status  Activity Tolerance: Patient tolerated treatment well Patient left: in bed;with call bell/phone within reach;with nursing/sitter in room  OT Visit Diagnosis: Other abnormalities of gait and mobility (R26.89);Muscle weakness (generalized) (M62.81)                Time: 1000-1038 OT Time Calculation (min): 38 min Charges:  OT General Charges $OT Visit: 1 Visit OT Evaluation $OT Re-eval: 1 Re-eval OT Treatments $Self Care/Home Management : 38-52 mins   Jackquline Denmark, MS, OTR/L , CBIS ascom [phone]  07/06/20, 12:56 PM   07/06/2020, 12:55 PM

## 2020-07-07 DIAGNOSIS — J690 Pneumonitis due to inhalation of food and vomit: Secondary | ICD-10-CM | POA: Diagnosis not present

## 2020-07-07 DIAGNOSIS — J9621 Acute and chronic respiratory failure with hypoxia: Secondary | ICD-10-CM | POA: Diagnosis not present

## 2020-07-07 DIAGNOSIS — G9341 Metabolic encephalopathy: Secondary | ICD-10-CM | POA: Diagnosis not present

## 2020-07-07 DIAGNOSIS — J441 Chronic obstructive pulmonary disease with (acute) exacerbation: Secondary | ICD-10-CM | POA: Diagnosis not present

## 2020-07-07 LAB — GLUCOSE, CAPILLARY
Glucose-Capillary: 102 mg/dL — ABNORMAL HIGH (ref 70–99)
Glucose-Capillary: 90 mg/dL (ref 70–99)
Glucose-Capillary: 96 mg/dL (ref 70–99)
Glucose-Capillary: 100 mg/dL — ABNORMAL HIGH (ref 70–99)
Glucose-Capillary: 123 mg/dL — ABNORMAL HIGH (ref 70–99)
Glucose-Capillary: 143 mg/dL — ABNORMAL HIGH (ref 70–99)

## 2020-07-07 LAB — IRON AND TIBC
Iron: 25 ug/dL — ABNORMAL LOW (ref 28–170)
Saturation Ratios: 11 % (ref 10.4–31.8)
TIBC: 228 ug/dL — ABNORMAL LOW (ref 250–450)
UIBC: 203 ug/dL

## 2020-07-07 LAB — BASIC METABOLIC PANEL
Anion gap: 9 (ref 5–15)
BUN: 16 mg/dL (ref 8–23)
CO2: 37 mmol/L — ABNORMAL HIGH (ref 22–32)
Calcium: 8.8 mg/dL — ABNORMAL LOW (ref 8.9–10.3)
Chloride: 94 mmol/L — ABNORMAL LOW (ref 98–111)
Creatinine, Ser: 0.88 mg/dL (ref 0.44–1.00)
GFR, Estimated: >60 mL/min (ref >60)
Glucose, Bld: 104 mg/dL — ABNORMAL HIGH (ref 70–99)
Potassium: 3.7 mmol/L (ref 3.5–5.1)
Sodium: 140 mmol/L (ref 135–145)

## 2020-07-07 LAB — CBC WITH DIFFERENTIAL/PLATELET
Abs Immature Granulocytes: 0.07 10*3/uL (ref 0.00–0.07)
Basophils Absolute: 0 10*3/uL (ref 0.0–0.1)
Basophils Relative: 0 %
Eosinophils Absolute: 0.2 10*3/uL (ref 0.0–0.5)
Eosinophils Relative: 3 %
HCT: 25.6 % — ABNORMAL LOW (ref 36.0–46.0)
Hemoglobin: 7.9 g/dL — ABNORMAL LOW (ref 12.0–15.0)
Immature Granulocytes: 1 %
Lymphocytes Relative: 16 %
Lymphs Abs: 1.1 10*3/uL (ref 0.7–4.0)
MCH: 28.7 pg (ref 26.0–34.0)
MCHC: 30.9 g/dL (ref 30.0–36.0)
MCV: 93.1 fL (ref 80.0–100.0)
Monocytes Absolute: 0.6 10*3/uL (ref 0.1–1.0)
Monocytes Relative: 8 %
Neutro Abs: 5.1 10*3/uL (ref 1.7–7.7)
Neutrophils Relative %: 72 %
Platelets: 389 10*3/uL (ref 150–400)
RBC: 2.75 MIL/uL — ABNORMAL LOW (ref 3.87–5.11)
RDW: 14.7 % (ref 11.5–15.5)
WBC: 7.1 10*3/uL (ref 4.0–10.5)
nRBC: 0 % (ref 0.0–0.2)

## 2020-07-07 LAB — PROCALCITONIN: Procalcitonin: <0.1 ng/mL

## 2020-07-07 LAB — BRAIN NATRIURETIC PEPTIDE: B Natriuretic Peptide: 88.4 pg/mL (ref 0.0–100.0)

## 2020-07-07 LAB — MAGNESIUM: Magnesium: 1.9 mg/dL (ref 1.7–2.4)

## 2020-07-07 LAB — PHOSPHORUS: Phosphorus: 4.3 mg/dL (ref 2.5–4.6)

## 2020-07-07 LAB — VITAMIN B12: Vitamin B-12: 265 pg/mL (ref 180–914)

## 2020-07-07 MED ORDER — NAPHAZOLINE-GLYCERIN 0.012-0.2 % OP SOLN
1.0000 [drp] | Freq: Four times a day (QID) | OPHTHALMIC | Status: DC | PRN
  Administered 2020-07-08 – 2020-07-12 (×3): 2 [drp] via OPHTHALMIC
  Filled 2020-07-07: qty 15

## 2020-07-07 NOTE — TOC Progression Note (Addendum)
Transition of Care [facility]) - Progression Note    Patient Details  Name: AIJA SCARFO MRN: [id number] Date of Birth: 1950/07/29  [facility]) CM/SW Contact  Liliana Cline, LCSW Phone Number: 07/07/2020, 10:14 AM  Clinical Narrative:   CSW reached out to [employer], inquired about any updates on insurance appeal. Lurena Joiner reported she is having daughter in law call insurance company.   Patient would not be able to go to SNF until she has had trach for at least 30 days (07/16/20).      Barriers to Discharge: Continued Medical Work up  Expected Discharge Plan and Services   In-house Referral: Clinical Social Work,Hospice / Palliative Care   Post Acute Care Choice: Durable Medical Equipment (Oxygen 4L) Living arrangements for the past 2 months: Apartment                                       Social Determinants of Health (SDOH) Interventions    Readmission Risk Interventions No flowsheet data found.

## 2020-07-07 NOTE — Progress Notes (Signed)
PROGRESS NOTE    Virginia [name]  PTW:[id number] DOB: [birth year] DOA: 06/06/2020 PCP: Idelle Crouch, MD   Chief complaint.  Shortness of breath. Brief Narrative:  Virginia [name] a 70 y.o.femalesevere COPD admitted with acute on chronic hypoxic hypercapnic respiratory failure and acute encephalopathy secondary to narcotic use and AECOPD requiring mechanical intubation.She was treated with steroids and empiric IV antibiotics. She could not be liberated from the ventilator so tracheostomy was performed and she has been receiving oxygen via trach collar. Now s/p trach/PEG working on trach weaning. 1/15.  Patient developed worsening hypoxemia, chest x-ray showed new bilateral lower lobe infiltrates consistent with aspiration pneumonia.  Zosyn started for 5 days.   Assessment & Plan:   Active Problems:   AF (paroxysmal atrial fibrillation) (HCC)   Dysphagia   Respiratory failure (HCC)   COPD exacerbation (HCC)   Acute urinary retention   Acute metabolic encephalopathy   Acute on chronic respiratory failure with hypoxia and hypercapnia ([place])  #1.  Acute on chronic respite failure with hypoxemia and hypercapnia. COPD exacerbation. New aspiration pneumonia. Patient developed worsening hypoxemia since yesterday, she also had increased airway secretion requiring sections.  Chest x-ray showed bilateral lower lobe patchy infiltrates consistent with pneumonia. Patient has been evaluated by speech therapy, currently he is n.p.o., only tube feeding. I will continue Zosyn for a total course of 5 days. Continue oxygen treatment. Patient currently does not have bronchospasm, no additional exacerbation of COPD. BNP was normal, not volume overloaded.  #2.  Dysphagia. Moderate protein calorie malnutrition. Deconditioning. Continue to follow with speech therapy.  Continue PT and OT.  Continue tube feeding.  3.  Acute metabolic encephalopathy Condition has improved  4.   Paroxysmal atrial fibrillation. Continue Eliquis and metoprolol.  #5.  Anemia. Patient has significant anemia, will check iron and B12 level.       DVT prophylaxis: Eliquis Code Status: Full Family Communication:  Disposition Plan:  .   Status is: Inpatient  Remains inpatient appropriate because:Inpatient level of care appropriate due to severity of illness   Dispo: The patient is from: Home              Anticipated d/c is to: SNF              Anticipated d/c date is: 3 days              Patient currently is not medically stable to d/c.        I/O last 3 completed shifts: In: 1119.5 [Other:400; NG/GT:711; IV Piggyback:8.5] Out: 700 [Urine:700] No intake/output data recorded.     Consultants:   None  Procedures: Tracheostomy and  PEG  Antimicrobials: Zosyn.  Subjective: Patient still has significant hypoxia, she has increased airway secretion with some bloody mucus.  She has short of breath with minimal exertion. She has no fever or chills. I spoke with the nurse, patient is receiving 6 cans of tube feeding daily.  She did not refuse tube feeding today.  She has no nausea vomiting or abdominal pain. She denied any chest pain or palpitation. No abdominal pain or nausea vomiting. No dysuria or hematuria.  Objective: Vitals:   07/07/20 0104 07/07/20 0454 07/07/20 0818 07/07/20 0908  BP:  108/77 131/90   Pulse:  77 99 89  Resp:  _0 Temp:  97.9 F (36.6 C) 98.5 F (36.9 C)   TempSrc:   Oral   SpO2: 96% 97% 95% 95%  Weight: 53.5 kg  Height:        Intake/Output Summary (Last 24 hours) at 07/07/2020 1028 Last data filed at 07/07/2020 0500 Gross per 24 hour  Intake 859.45 ml  Output 700 ml  Net 159.45 ml   Filed Weights   07/05/20 0140 07/06/20 0609 07/07/20 0104  Weight: 55 kg 50.3 kg 53.5 kg    Examination:  General exam: Appears calm and comfortable  Respiratory system: Significantly decreased breathing sounds bilaterally.  Respiratory effort normal. Cardiovascular system: S1 & S2 heard, RRR. No JVD, murmurs, rubs, gallops or clicks. No pedal edema. Gastrointestinal system: Abdomen is nondistended, soft and nontender. No organomegaly or masses felt. Normal bowel sounds heard. Central nervous system: Alert and oriented. No focal neurological deficits. Extremities: Symmetric . Skin: No rashes, lesions or ulcers Psychiatry: Judgement and insight appear normal. Mood & affect appropriate.     Data Reviewed: I have personally reviewed following labs and imaging studies  CBC: Recent Labs  Lab 07/01/20 0510 07/02/20 0441 07/03/20 0438 07/07/20 0855  WBC 7.1 7.1 6.5 7.1  NEUTROABS  --   --   --  5.1  HGB 8.5* 7.7* 8.1* 7.9*  HCT 27.8* 25.8* 26.5* 25.6*  MCV 97.5 97.4 97.8 93.1  PLT 394 376 361 283   Basic Metabolic Panel: Recent Labs  Lab 07/01/20 0510 07/02/20 0441 07/03/20 0438 07/04/20 0516 07/05/20 1022 07/06/20 0142 07/07/20 0855  NA 140  --   --   --   --   --  140  K 4.2  --   --   --   --   --  3.7  CL 93*  --   --   --   --   --  94*  CO2 38*  --   --   --   --   --  37*  GLUCOSE 101*  --   --   --   --   --  104*  BUN 20  --   --   --   --   --  16  CREATININE 0.53  --   --   --   --   --  0.88  CALCIUM 9.0  --   --   --   --   --  8.8*  MG 2.1   < > 2.0 2.0 1.8 1.9 1.9  PHOS 4.1   < > 4.4 5.6* 4.9* 4.5 4.3   < > = values in this interval not displayed.   GFR: Estimated Creatinine Clearance: 47.7 mL/min (by C-G formula based on SCr of 0.88 mg/dL). Liver Function Tests: No results for input(s): AST, ALT, ALKPHOS, BILITOT, PROT, ALBUMIN in the last 168 hours. No results for input(s): LIPASE, AMYLASE in the last 168 hours. No results for input(s): AMMONIA in the last 168 hours. Coagulation Profile: No results for input(s): INR, PROTIME in the last 168 hours. Cardiac Enzymes: No results for input(s): CKTOTAL, CKMB, CKMBINDEX, TROPONINI in the last 168 hours. BNP (last 3  results) No results for input(s): PROBNP in the last 8760 hours. HbA1C: No results for input(s): HGBA1C in the last 72 hours. CBG: Recent Labs  Lab 07/06/20 1637 07/06/20 2017 07/07/20 0057 07/07/20 0453 07/07/20 0817  GLUCAP 109* 122* 102* 90 96   Lipid Profile: No results for input(s): CHOL, HDL, LDLCALC, TRIG, CHOLHDL, LDLDIRECT in the last 72 hours. Thyroid Function Tests: No results for input(s): TSH, T4TOTAL, FREET4, T3FREE, THYROIDAB in the last 72 hours. Anemia Panel: No results for input(s): VITAMINB12,  FOLATE, FERRITIN, TIBC, IRON, RETICCTPCT in the last 72 hours. Sepsis Labs: No results for input(s): PROCALCITON, LATICACIDVEN in the last 168 hours.  No results found for this or any previous visit (from the past 240 hour(s)).       Radiology Studies: DG Chest 1 View  Result Date: 07/06/2020 CLINICAL DATA:  Acute hypoxic respiratory failure. EXAM: CHEST  1 VIEW COMPARISON:  Single-view of the chest 06/19/2020 in 06/11/2020. FINDINGS: Tracheostomy tube and right PICC are in place. Tip of the patient's PICC projects in the right atrium. There is patchy airspace disease in the right mid and lower lung zones and the lingula. No pneumothorax or pleural fluid. Heart size is normal. IMPRESSION: Tip of right PICC projects in the right atrium. The catheter could be withdrawn 3-4 cm for better positioning. Right worse than left patchy airspace disease worrisome for pneumonia. Electronically Signed   By: Inge Rise M.D.   On: 07/06/2020 13:08        Scheduled Meds: . sodium chloride   Intravenous Once  . apixaban  5 mg Per Tube BID  . arformoterol  15 mcg Nebulization BID  . chlorhexidine gluconate (MEDLINE KIT)  15 mL Mouth Rinse BID  . Chlorhexidine Gluconate Cloth  6 each Topical Daily  . clonazePAM  1 mg Per Tube BID  . feeding supplement (OSMOLITE 1.2 CAL)  237 mL Per Tube 6 X Daily  . feeding supplement (PROSource TF)  45 mL Per Tube Daily  . fentaNYL  1  patch Transdermal Q72H  . free water  60 mL Per Tube 6 X Daily  . gabapentin  300 mg Per Tube TID  . glycopyrrolate  1 mg Per Tube BID  . insulin aspart  0-6 Units Subcutaneous Q4H  . metoprolol tartrate  50 mg Per Tube BID  . pantoprazole (PROTONIX) IV  40 mg Intravenous QHS  . QUEtiapine  25 mg Per Tube QHS  . sodium chloride flush  10-40 mL Intracatheter Q12H  . sodium chloride flush  3 mL Intravenous Q12H   Continuous Infusions: . sodium chloride Stopped (06/25/20 1842)  . sodium chloride    . piperacillin-tazobactam 3.375 g (07/07/20 0644)     LOS: 31 days    Time spent: 36 minutes    Sharen Hones, MD [facility]   To contact the attending provider between 7A-7P or the covering provider during after hours 7P-7A, please log into the web site [email or web address] and access using universal [place] password for that web site. If you do not have the password, please call the hospital operator.  07/07/2020, 10:28 AM

## 2020-07-07 NOTE — Progress Notes (Signed)
CRITICAL CARE PROGRESS NOTE    Name: VERTA RIEDLINGER MRN: [id number] DOB: 02-02-1951     LOS: 8   SUBJECTIVE FINDINGS & SIGNIFICANT EVENTS    Patient description:  70 yo female with severe COPD admitted with acute on chronic hypoxic hypercapnic respiratory failure and acute encephalopathy secondary to narcotic use and AECOPD requiring mechanical intubation  Now s/p trach/PEG working on trach weaning.  On TC at present, anxious. Remains on precedex.  06/27/20- patient is improved.  Signed out to [facility] - Dr Jimmye Norman for pick up in am.   06/29/20- patient is with respiratory distress, she has high volume phlegm from trache, ive suctioned out whats possible via [place] and messaged RT to assist.  RN was able to come in and evaluate patient and she was able to communicate appropriately.   07/07/20- patient is in bed resting. She is clinically better today then yesterday. Resting in bed in no distress.  PHYSICAL EXAMINATION   Vital Signs: Temp:  [97.8 F (36.6 C)-98.5 F (36.9 C)] 98.3 F (36.8 C) (01/14 1645) Pulse Rate:  [74-102] 97 (01/14 1645) Resp:  [16-20] 20 (01/14 1645) BP: (108-157)/(73-99) 157/95 (01/14 1645) SpO2:  [92 %-99 %] 92 % (01/14 1645) FiO2 (%):  [35 %] 35 % (01/14 0908) Weight:  [53.5 kg] 53.5 kg (01/14 0104)   FiO2 (%):  [35 %] 35 %   Constitutional: anxious frail woman lying in bed  Eyes: eomi, pupils equal Ears, nose, mouth, and throat: trach in place, minmal secretions Cardiovascular: RRR,  Ext warm Respiratory: diminished with prolonged expiratory phase, occasional accessory muscle use Gastrointestinal: soft, abd wrapped after PEG, some mild tenderness at PEG insertion site MSK: muscle wasting Skin: No rashes, normal turgor, scattered bruising Neurologic: moves all 4 ext to  command, weak Psychiatric: anxious, answering questions appropriately   Scheduled Meds: . sodium chloride   Intravenous Once  . apixaban  5 mg Per Tube BID  . arformoterol  15 mcg Nebulization BID  . chlorhexidine gluconate (MEDLINE KIT)  15 mL Mouth Rinse BID  . Chlorhexidine Gluconate Cloth  6 each Topical Daily  . clonazePAM  1 mg Per Tube BID  . feeding supplement (OSMOLITE 1.2 CAL)  237 mL Per Tube 6 X Daily  . feeding supplement (PROSource TF)  45 mL Per Tube Daily  . fentaNYL  1 patch Transdermal Q72H  . free water  60 mL Per Tube 6 X Daily  . gabapentin  300 mg Per Tube TID  . glycopyrrolate  1 mg Per Tube BID  . insulin aspart  0-6 Units Subcutaneous Q4H  . metoprolol tartrate  50 mg Per Tube BID  . pantoprazole (PROTONIX) IV  40 mg Intravenous QHS  . QUEtiapine  25 mg Per Tube QHS  . sodium chloride flush  10-40 mL Intracatheter Q12H  . sodium chloride flush  3 mL Intravenous Q12H   Continuous Infusions: . sodium chloride Stopped (06/25/20 1842)  . sodium chloride    . piperacillin-tazobactam 3.375 g (07/07/20 1457)   PRN Meds:.sodium chloride, acetaminophen (TYLENOL) oral liquid 160 mg/5 mL, diazepam, ipratropium-albuterol, labetalol, metoprolol tartrate, naphazoline-glycerin, ondansetron (ZOFRAN) IV, oxyCODONE, polyethylene glycol, sodium chloride flush, sodium chloride flush    ASSESSMENT AND PLAN   Acute on chronic hypoxemic and hypercapnic respiratory failure thought secondary to polypharmacy with inability to wean from ventilator due to muscular deconditioning now s/p tracheostomy.  Dysphagia, moderate protein calorie malnutrition- now s/p PEG  Severe baseline COPD- complicated trach wean  Muscular deconditioning  Chronic anxiety/depression  Metabolic encephalopathy/agitation- improving but remains on precedex gtt  - repeat tracheal aspirate ordered - 1/6 and 06/30/20 - Switched from standing duonebs from duonebs to brovana/yupelri - PT/OT up to chair -  Continue standing clonazepam, use valium per tube for breakthrough - SLP for PMV trials - Work toward getting off IV meds so she can be candidate for rehab vs. SNF vs. LTACH   Patient chronically critically ill due to respiratory failure, metabolic encephalopathy Interventions to address this today weaning precedex, weaning vent Risk of deterioration without these interventions is high   Multidisciplinary rounds were performed with the ICU team.  [facility] *This note was dictated using voice recognition software/Dragon.  Despite best efforts to proofread, errors can occur which can change the meaning.  Any change was purely unintentional.     Ottie Glazier, M.D.  [facility] [place]

## 2020-07-07 NOTE — Progress Notes (Signed)
RT at bedside of patient and she is a little anxious and saying she was having some difficulty breathing. RT suctioned patient and got a nice size plug out of trachea with one pass. RT suctioned patient again and maintained saturations and patient said she felt a lot better. Pt is in no distress and RT will monitor for remaining of shift.

## 2020-07-07 NOTE — Progress Notes (Signed)
Physical Therapy Re-Evaluation Patient Details Name: Virginia [name] MRN: [id number] DOB: 1951-02-19 Today's Date: 07/07/2020   History of Present Illness  70 yo female with severe COPD admitted with acute on chronic hypoxic hypercapnic respiratory failure and acute encephalopathy secondary to narcotic use and AECOPD requiring mechanical intubation. Initially intubated from 12/14-12/16 and then again from 12/18-12/23. Now with + trach collar. Recent hospitalization from 12/10-12/12/21.  Clinical Impression  Pt seen for PT re-evaluation on this date. Pt received in bed with trach collar donned & on 8L/min, 35% FiO2 via trach collar. Nurse cleared pt to wear PMSV during session & PT applies it but educates pt on how to do so, as pt reports she's never done it. Pt completes supine>sit with CGA & bed rails and requires extra time & cuing to scoot to sitting EOB. SPO2 decreased to 76% (but unsure of accuracy as pt with inconsistent waveform) & PMSV doffed, SpO2 increased to >90%. PT educates pt on pursed lip breathing & pt agreeable to attempting to stand. Pt requires min/mod assist for sit>stand with RW & is able to progress to marching in place with pt able to clear R foot from floor but with difficulty lifting LLE to clear it from floor. Pt then quickly reports need to sit & requires mod assist for sit>supine 2/2 "weakness". SpO2 dropped again (inconsistent waveform again) & nurse called to room, but pt returned to >90%. Pt assisted with using bed pan as pt notes need to void bladder & pt does so. Pt left in bed with all needs in reach. Will continue to follow pt acutely to progress gait as able, as well as for strengthening & balance. Pt would benefit from STR upon d/c from acute setting to maximize independence with mobility.  Pt's bed mobility goal has been upgraded, but other goals remain appropriate at this time. Pt continues to demonstrate decreased independence with functional mobility, please see  "PT problem list below".     Follow Up Recommendations SNF    Equipment Recommendations  None recommended by PT    Recommendations for Other Services       Precautions / Restrictions Precautions Precautions: Fall Precaution Comments: trach Restrictions Weight Bearing Restrictions: No Other Position/Activity Restrictions: watch sats      Mobility  Bed Mobility Overal bed mobility: Needs Assistance Bed Mobility: Supine to Sit;Sit to Supine     Supine to sit: Min guard;HOB elevated Sit to supine: Mod assist;HOB elevated   General bed mobility comments: use of bed rails, cuing & assist to lower trunk then elevate BLE for sit>supine    Transfers Overall transfer level: Needs assistance Equipment used: Rolling walker (2 wheeled) Transfers: Sit to/from Stand Sit to Stand: Min assist;Mod assist         General transfer comment: Cuing for proper hand placement to push up from EOB  Ambulation/Gait                Stairs            Wheelchair Mobility    Modified Rankin (Stroke Patients Only)       Balance Overall balance assessment: Needs assistance Sitting-balance support: Single extremity supported;Feet supported Sitting balance-Leahy Scale: Fair Sitting balance - Comments: close supervision static sitting EOB   Standing balance support: Bilateral upper extremity supported Standing balance-Leahy Scale: Fair Standing balance comment: reliance on BUE support on RW  Pertinent Vitals/Pain Pain Assessment: No/denies pain    Home Living Family/patient expects to be discharged to:: Private residence Living Arrangements: Alone Available Help at Discharge: Family;Available PRN/intermittently Type of Home: Apartment Home Access: Level entry     Home Layout: One level Home Equipment: Emergency planning/management officer - 4 wheels      Prior Function Level of Independence: Independent with assistive device(s)          Comments: MOD I using RW including driving     Hand Dominance   Dominant Hand: Right    Extremity/Trunk Assessment                Communication   Communication: Tracheostomy  Cognition Arousal/Alertness: Awake/alert Behavior During Therapy: WFL for tasks assessed/performed Overall Cognitive Status: Within Functional Limits for tasks assessed                                        General Comments      Exercises     Assessment/Plan    PT Assessment Patient needs continued PT services  PT Problem List Decreased activity tolerance;Decreased balance;Cardiopulmonary status limiting activity;Decreased strength;Decreased knowledge of use of DME;Decreased mobility       PT Treatment Interventions Gait training;Therapeutic exercise;Balance training;Therapeutic activities;DME instruction;Functional mobility training;Patient/family education;Neuromuscular re-education;Stair training    PT Goals (Current goals can be found in the Care Plan section)  Acute Rehab PT Goals Patient Stated Goal: To return home safely PT Goal Formulation: With patient Time For Goal Achievement: 07/21/20 Potential to Achieve Goals: Good Additional Goals Additional Goal #1: Pt wil perform bed mobiity with mod I with hospital bed features to improve functional independence.    Frequency Min 2X/week   Barriers to discharge  Pt lives alone.      Co-evaluation               AM-PAC PT "6 Clicks" Mobility  Outcome Measure Help needed turning from your back to your side while in a flat bed without using bedrails?: A Little Help needed moving from lying on your back to sitting on the side of a flat bed without using bedrails?: A Little Help needed moving to and from a bed to a chair (including a wheelchair)?: A Lot Help needed standing up from a chair using your arms (e.g., wheelchair or bedside chair)?: A Little Help needed to walk in hospital room?: Total Help needed  climbing 3-5 steps with a railing? : Total 6 Click Score: 13    End of Session Equipment Utilized During Treatment: Oxygen;Gait belt Activity Tolerance: Patient tolerated treatment well Patient left: in bed;with call bell/phone within reach;with bed alarm set Nurse Communication:  (O2) PT Visit Diagnosis: Muscle weakness (generalized) (M62.81);Difficulty in walking, not elsewhere classified (R26.2)    Time: 3007-6226 PT Time Calculation (min) (ACUTE ONLY): 24 min   Charges:   PT Evaluation $PT Re-evaluation: 1 Re-eval PT Treatments $Therapeutic Activity: 23-37 mins        Aleda Grana, PT, DPT 07/07/20, 2:25 PM   Sandi Mariscal 07/07/2020, 2:24 PM

## 2020-07-07 NOTE — Progress Notes (Signed)
Pt to be completed by RN

## 2020-07-07 NOTE — Plan of Care (Signed)
Patient with tracheostomy has breathing improvement after Duonebs and suctioning. Problem: Clinical Measurements: Goal: Respiratory complications will improve Outcome: Progressing

## 2020-07-08 ENCOUNTER — Inpatient Hospital Stay: Payer: Medicare Other

## 2020-07-08 DIAGNOSIS — J690 Pneumonitis due to inhalation of food and vomit: Secondary | ICD-10-CM | POA: Diagnosis not present

## 2020-07-08 DIAGNOSIS — G9341 Metabolic encephalopathy: Secondary | ICD-10-CM | POA: Diagnosis not present

## 2020-07-08 DIAGNOSIS — J9622 Acute and chronic respiratory failure with hypercapnia: Secondary | ICD-10-CM | POA: Diagnosis not present

## 2020-07-08 DIAGNOSIS — J9621 Acute and chronic respiratory failure with hypoxia: Secondary | ICD-10-CM | POA: Diagnosis not present

## 2020-07-08 LAB — CBC WITH DIFFERENTIAL/PLATELET
Abs Immature Granulocytes: 0.2 10*3/uL — ABNORMAL HIGH (ref 0.00–0.07)
Basophils Absolute: 0 10*3/uL (ref 0.0–0.1)
Basophils Relative: 0 %
Eosinophils Absolute: 0.2 10*3/uL (ref 0.0–0.5)
Eosinophils Relative: 2 %
HCT: 26.3 % — ABNORMAL LOW (ref 36.0–46.0)
Hemoglobin: 8.2 g/dL — ABNORMAL LOW (ref 12.0–15.0)
Immature Granulocytes: 2 %
Lymphocytes Relative: 18 %
Lymphs Abs: 1.7 10*3/uL (ref 0.7–4.0)
MCH: 29.6 pg (ref 26.0–34.0)
MCHC: 31.2 g/dL (ref 30.0–36.0)
MCV: 94.9 fL (ref 80.0–100.0)
Monocytes Absolute: 0.8 10*3/uL (ref 0.1–1.0)
Monocytes Relative: 9 %
Neutro Abs: 6.7 10*3/uL (ref 1.7–7.7)
Neutrophils Relative %: 69 %
Platelets: 416 10*3/uL — ABNORMAL HIGH (ref 150–400)
RBC: 2.77 MIL/uL — ABNORMAL LOW (ref 3.87–5.11)
RDW: 14.8 % (ref 11.5–15.5)
WBC: 9.7 10*3/uL (ref 4.0–10.5)
nRBC: 0.2 % (ref 0.0–0.2)

## 2020-07-08 LAB — GLUCOSE, CAPILLARY
Glucose-Capillary: 104 mg/dL — ABNORMAL HIGH (ref 70–99)
Glucose-Capillary: 118 mg/dL — ABNORMAL HIGH (ref 70–99)
Glucose-Capillary: 118 mg/dL — ABNORMAL HIGH (ref 70–99)
Glucose-Capillary: 119 mg/dL — ABNORMAL HIGH (ref 70–99)
Glucose-Capillary: 128 mg/dL — ABNORMAL HIGH (ref 70–99)
Glucose-Capillary: 84 mg/dL (ref 70–99)

## 2020-07-08 MED ORDER — AMOXICILLIN-POT CLAVULANATE 875-125 MG PO TABS
1.0000 | ORAL_TABLET | Freq: Two times a day (BID) | ORAL | Status: DC
  Administered 2020-07-08 – 2020-07-10 (×6): 1
  Filled 2020-07-08 (×6): qty 1

## 2020-07-08 MED ORDER — CYANOCOBALAMIN 1000 MCG/ML IJ SOLN
1000.0000 ug | Freq: Once | INTRAMUSCULAR | Status: AC
  Administered 2020-07-08: 1000 ug via INTRAMUSCULAR
  Filled 2020-07-08: qty 1

## 2020-07-08 NOTE — Progress Notes (Signed)
PULMONARY PROGRESS NOTE    Name: Virginia [name] MRN: [id number] DOB: [birth year]     LOS: 85   SUBJECTIVE FINDINGS & SIGNIFICANT EVENTS    Patient description:  70 yo female with severe COPD admitted with acute on chronic hypoxic hypercapnic respiratory failure and acute encephalopathy secondary to narcotic use and AECOPD requiring mechanical intubation  Now s/p trach/PEG working on trach weaning.  On TC at present, anxious. Remains on precedex.  06/27/20- patient is improved.  Signed out to [facility] - Dr Jimmye Norman for pick up in am.   06/29/20- patient is with respiratory distress, she has high volume phlegm from trache, ive suctioned out whats possible via Yankauer and messaged RT to assist.  RN was able to come in and evaluate patient and she was able to communicate appropriately.   07/07/20- patient is in bed resting. She is clinically better today then yesterday. Resting in bed in no distress.  07/08/20- Patient is lucid and awake.  Son is at bedside we discussed care plan.  CXR in am.   PHYSICAL EXAMINATION   Vital Signs: Temp:  [98.4 F (36.9 C)-99.1 F (37.3 C)] 98.4 F (36.9 C) (01/15 1605) Pulse Rate:  [88-104] 96 (01/15 1605) Resp:  [17-20] 20 (01/15 1605) BP: (113-171)/(82-96) 171/96 (01/15 1605) SpO2:  [91 %-99 %] 96 % (01/15 1605) FiO2 (%):  [28 %] 28 % (01/15 0815)   FiO2 (%):  [28 %] 28 %   Constitutional: anxious frail woman lying in bed  Eyes: eomi, pupils equal Ears, nose, mouth, and throat: trach in place, minmal secretions Cardiovascular: RRR,  Ext warm Respiratory: diminished with prolonged expiratory phase, occasional accessory muscle use Gastrointestinal: soft, abd wrapped after PEG, some mild tenderness at PEG insertion site MSK: muscle wasting Skin: No rashes, normal turgor,  scattered bruising Neurologic: moves all 4 ext to command, weak Psychiatric: anxious, answering questions appropriately   Scheduled Meds: . sodium chloride   Intravenous Once  . amoxicillin-clavulanate  1 tablet Per Tube Q12H  . apixaban  5 mg Per Tube BID  . arformoterol  15 mcg Nebulization BID  . chlorhexidine gluconate (MEDLINE KIT)  15 mL Mouth Rinse BID  . Chlorhexidine Gluconate Cloth  6 each Topical Daily  . clonazePAM  1 mg Per Tube BID  . feeding supplement (OSMOLITE 1.2 CAL)  237 mL Per Tube 6 X Daily  . feeding supplement (PROSource TF)  45 mL Per Tube Daily  . fentaNYL  1 patch Transdermal Q72H  . free water  60 mL Per Tube 6 X Daily  . gabapentin  300 mg Per Tube TID  . glycopyrrolate  1 mg Per Tube BID  . insulin aspart  0-6 Units Subcutaneous Q4H  . metoprolol tartrate  50 mg Per Tube BID  . pantoprazole (PROTONIX) IV  40 mg Intravenous QHS  . QUEtiapine  25 mg Per Tube QHS  . sodium chloride flush  10-40 mL Intracatheter Q12H  . sodium chloride flush  3 mL Intravenous Q12H   Continuous Infusions: . sodium chloride Stopped (06/25/20 1842)  . sodium chloride     PRN Meds:.sodium chloride, acetaminophen (TYLENOL) oral liquid 160 mg/5 mL, diazepam, ipratropium-albuterol, labetalol, metoprolol tartrate, naphazoline-glycerin, ondansetron (ZOFRAN) IV, oxyCODONE, polyethylene glycol, sodium chloride flush, sodium chloride flush    ASSESSMENT AND PLAN   Acute on chronic hypoxemic and hypercapnic respiratory failure thought secondary to polypharmacy with inability to wean from ventilator due to muscular deconditioning now s/p tracheostomy.  Dysphagia, moderate protein calorie  malnutrition- now s/p PEG  Severe baseline COPD- complicated trach wean  Muscular deconditioning  Chronic anxiety/depression  Metabolic encephalopathy/agitation- improving but remains on precedex gtt  - repeat tracheal aspirate ordered - 1/6 and 06/30/20 - Switched from standing duonebs from  duonebs to brovana/yupelri - PT/OT up to chair - Continue standing clonazepam, use valium per tube for breakthrough - SLP for PMV trials - Work toward getting off IV meds so she can be candidate for rehab vs. SNF vs. LTACH   Patient chronically critically ill due to respiratory failure, metabolic encephalopathy Interventions to address this today weaning precedex, weaning vent Risk of deterioration without these interventions is high   Multidisciplinary rounds were performed with the ICU team.  Patient would benefit from [facility]. *This note was dictated using voice recognition software/Dragon.  Despite best efforts to proofread, errors can occur which can change the meaning.  Any change was purely unintentional.     Ottie Glazier, M.D.  Pulmonary & [place]

## 2020-07-08 NOTE — Progress Notes (Addendum)
PROGRESS NOTE    Virginia [name]  RSW:[id number] DOB: 11/07/1950 DOA: 06/06/2020 PCP: Idelle Crouch, MD   Chief complaint.  Shortness of breath. Brief Narrative:  [place] PAONE a 70 y.o.femalesevere COPD admitted with acute on chronic hypoxic hypercapnic respiratory failure and acute encephalopathy secondary to narcotic use and AECOPD requiring mechanical intubation.She was treated with steroids and empiric IV antibiotics. She could not be liberated from the ventilator so tracheostomy was performed and she has been receiving oxygen via trach collar. Now s/p trach/PEG working on trach weaning. 1/15.  Patient developed worsening hypoxemia, chest x-ray showed new bilateral lower lobe infiltrates consistent with aspiration pneumonia.  Zosyn started.   Assessment & Plan:   Active Problems:   AF (paroxysmal atrial fibrillation) (HCC)   Dysphagia   Respiratory failure (HCC)   COPD exacerbation (HCC)   Acute urinary retention   Acute metabolic encephalopathy   Acute on chronic respiratory failure with hypoxia and hypercapnia (HCC)   Aspiration pneumonia of both lower lobes due to gastric secretions ([place])  #1. Acute on chronic respiratory failure with hypoxemia and hypercapnia. COPD exacerbation. Status post tracheostomy and PEG tube placement.  Aspiration pneumonia She still has significant airway secretions, short of breath with minimal exertion.  Oxygenation is gradually improving. Change antibiotic to oral Augmentin as patient is improving, she also developed some diarrhea.  #2.  Dysphagia. Moderate protein calorie malnutrition, Deconditioning. Patient is receiving tube feeding, continue PT and OT.  3.  Acute metabolic encephalopathy. Condition had improved.  #4.  Paroxysmal atrial fibrillation. Rate controlled with metoprolol, continue anticoagulation with Eliquis.  5.  Anemia. Reactive thrombocytosis. Borderline B12 level, give B12 injection, also check  homocystine level.     DVT prophylaxis: Eliquis Code Status: Full Family Communication:  Disposition Plan:  .   Status is: Inpatient  Remains inpatient appropriate because:Inpatient level of care appropriate due to severity of illness   Dispo: The patient is from: Home              Anticipated d/c is to: SNF              Anticipated d/c date is: > 3 days              Patient currently is not medically stable to d/c.        I/O last 3 completed shifts: In: -  Out: 704 [Urine:702; Stool:2] No intake/output data recorded.     Consultants:   Pulm  Procedures: PEG and trach.  Antimicrobials: Zosyn.  Subjective: She is still has significant amount thick airway secretion requiring multiple suctions a day.  She has short of breath with min exertion.  She has a weak cough. No fever or chills. No abdominal pain or nausea vomiting.  She had a some loose stools today. No dysuria or hematuria..  Objective: Vitals:   07/07/20 1645 07/07/20 2215 07/08/20 0000 07/08/20 0825  BP: (!) 157/95 134/83 113/82 (!) 154/92  Pulse: 97 96 92 (!) 104  Resp: _0 Temp: 98.3 F (36.8 C) 99 F (37.2 C) 99.1 F (37.3 C) 98.4 F (36.9 C)  TempSrc:  Oral    SpO2: 92% 99% 96% 95%  Weight:      Height:        Intake/Output Summary (Last 24 hours) at 07/08/2020 0958 Last data filed at 07/07/2020 1800 Gross per 24 hour  Intake -  Output 4 ml  Net -4 ml   [employer]  07/05/20 0140 07/06/20 0609 07/07/20 0104  Weight: 55 kg 50.3 kg 53.5 kg    Examination:  General exam: Appears calm and comfortable  Respiratory system: Decreased breath sounds with rhonchi. Respiratory effort normal. Cardiovascular system: S1 & S2 heard, RRR. No JVD, murmurs, rubs, gallops or clicks. No pedal edema. Gastrointestinal system: Abdomen is nondistended, soft and nontender. No organomegaly or masses felt. Normal bowel sounds heard. Central nervous system: Alert and oriented. No focal  neurological deficits. Extremities: Symmetric 5 x 5 power. Skin: No rashes, lesions or ulcers Psychiatry:  Mood & affect appropriate.     Data Reviewed: I have personally reviewed following labs and imaging studies  CBC: Recent Labs  Lab 07/02/20 0441 07/03/20 0438 07/07/20 0855 07/08/20 0616  WBC 7.1 6.5 7.1 9.7  NEUTROABS  --   --  5.1 6.7  HGB 7.7* 8.1* 7.9* 8.2*  HCT 25.8* 26.5* 25.6* 26.3*  MCV 97.4 97.8 93.1 94.9  PLT 376 361 389 629*   Basic Metabolic Panel: Recent Labs  Lab 07/03/20 0438 07/04/20 0516 07/05/20 1022 07/06/20 0142 07/07/20 0855  NA  --   --   --   --  140  K  --   --   --   --  3.7  CL  --   --   --   --  94*  CO2  --   --   --   --  37*  GLUCOSE  --   --   --   --  104*  BUN  --   --   --   --  16  CREATININE  --   --   --   --  0.88  CALCIUM  --   --   --   --  8.8*  MG 2.0 2.0 1.8 1.9 1.9  PHOS 4.4 5.6* 4.9* 4.5 4.3   GFR: Estimated Creatinine Clearance: 47.7 mL/min (by C-G formula based on SCr of 0.88 mg/dL). Liver Function Tests: No results for input(s): AST, ALT, ALKPHOS, BILITOT, PROT, ALBUMIN in the last 168 hours. No results for input(s): LIPASE, AMYLASE in the last 168 hours. No results for input(s): AMMONIA in the last 168 hours. Coagulation Profile: No results for input(s): INR, PROTIME in the last 168 hours. Cardiac Enzymes: No results for input(s): CKTOTAL, CKMB, CKMBINDEX, TROPONINI in the last 168 hours. BNP (last 3 results) No results for input(s): PROBNP in the last 8760 hours. HbA1C: No results for input(s): HGBA1C in the last 72 hours. CBG: Recent Labs  Lab 07/07/20 1643 07/07/20 2046 07/08/20 0001 07/08/20 0408 07/08/20 0826  GLUCAP 123* 143* 118* 84 118*   Lipid Profile: No results for input(s): CHOL, HDL, LDLCALC, TRIG, CHOLHDL, LDLDIRECT in the last 72 hours. Thyroid Function Tests: No results for input(s): TSH, T4TOTAL, FREET4, T3FREE, THYROIDAB in the last 72 hours. Anemia Panel: Recent Labs     07/07/20 0855  VITAMINB12 265  TIBC 228*  IRON 25*   Sepsis Labs: Recent Labs  Lab 07/07/20 0855  PROCALCITON <0.10    No results found for this or any previous visit (from the past 240 hour(s)).       Radiology Studies: DG Chest 1 View  Result Date: 07/06/2020 CLINICAL DATA:  Acute hypoxic respiratory failure. EXAM: CHEST  1 VIEW COMPARISON:  Single-view of the chest 06/19/2020 in 06/11/2020. FINDINGS: Tracheostomy tube and right PICC are in place. Tip of the patient's PICC projects in the right atrium. There is patchy airspace disease in the  right mid and lower lung zones and the lingula. No pneumothorax or pleural fluid. Heart size is normal. IMPRESSION: Tip of right PICC projects in the right atrium. The catheter could be withdrawn 3-4 cm for better positioning. Right worse than left patchy airspace disease worrisome for pneumonia. Electronically Signed   By: Inge Rise M.D.   On: 07/06/2020 13:08        Scheduled Meds: . sodium chloride   Intravenous Once  . apixaban  5 mg Per Tube BID  . arformoterol  15 mcg Nebulization BID  . chlorhexidine gluconate (MEDLINE KIT)  15 mL Mouth Rinse BID  . Chlorhexidine Gluconate Cloth  6 each Topical Daily  . clonazePAM  1 mg Per Tube BID  . cyanocobalamin  1,000 mcg Intramuscular Once  . feeding supplement (OSMOLITE 1.2 CAL)  237 mL Per Tube 6 X Daily  . feeding supplement (PROSource TF)  45 mL Per Tube Daily  . fentaNYL  1 patch Transdermal Q72H  . free water  60 mL Per Tube 6 X Daily  . gabapentin  300 mg Per Tube TID  . glycopyrrolate  1 mg Per Tube BID  . insulin aspart  0-6 Units Subcutaneous Q4H  . metoprolol tartrate  50 mg Per Tube BID  . pantoprazole (PROTONIX) IV  40 mg Intravenous QHS  . QUEtiapine  25 mg Per Tube QHS  . sodium chloride flush  10-40 mL Intracatheter Q12H  . sodium chloride flush  3 mL Intravenous Q12H   Continuous Infusions: . sodium chloride Stopped (06/25/20 1842)  . sodium chloride     . piperacillin-tazobactam 3.375 g (07/08/20 0629)     LOS: 32 days    Time spent: 27 minutes    Sharen Hones, MD [facility]   To contact the attending provider between 7A-7P or the covering provider during after hours 7P-7A, please log into the web site [email or web address] and access using universal [place] password for that web site. If you do not have the password, please call the hospital operator.  07/08/2020, 9:58 AM

## 2020-07-09 DIAGNOSIS — J9622 Acute and chronic respiratory failure with hypercapnia: Secondary | ICD-10-CM | POA: Diagnosis not present

## 2020-07-09 DIAGNOSIS — J9621 Acute and chronic respiratory failure with hypoxia: Secondary | ICD-10-CM | POA: Diagnosis not present

## 2020-07-09 DIAGNOSIS — G9341 Metabolic encephalopathy: Secondary | ICD-10-CM | POA: Diagnosis not present

## 2020-07-09 DIAGNOSIS — I480 Paroxysmal atrial fibrillation: Secondary | ICD-10-CM | POA: Diagnosis not present

## 2020-07-09 LAB — GLUCOSE, CAPILLARY
Glucose-Capillary: 101 mg/dL — ABNORMAL HIGH (ref 70–99)
Glucose-Capillary: 107 mg/dL — ABNORMAL HIGH (ref 70–99)
Glucose-Capillary: 97 mg/dL (ref 70–99)

## 2020-07-09 MED ORDER — CYANOCOBALAMIN 1000 MCG/ML IJ SOLN
1000.0000 ug | Freq: Every day | INTRAMUSCULAR | Status: AC
  Administered 2020-07-10 – 2020-07-11 (×2): 1000 ug via INTRAMUSCULAR
  Filled 2020-07-09 (×3): qty 1

## 2020-07-09 NOTE — Progress Notes (Signed)
PULMONARY PROGRESS NOTE    Name: Virginia [name] MRN: [id number] DOB: 1951/06/06     LOS: 65   SUBJECTIVE FINDINGS & SIGNIFICANT EVENTS    Patient description:  70 yo female with severe COPD admitted with acute on chronic hypoxic hypercapnic respiratory failure and acute encephalopathy secondary to narcotic use and AECOPD requiring mechanical intubation  Now s/p trach/PEG working on trach weaning.  On TC at present, anxious. Remains on precedex.  06/27/20- patient is improved.  Signed out to [facility] - Dr Jimmye Norman for pick up in am.   06/29/20- patient is with respiratory distress, she has high volume phlegm from trache, ive suctioned out whats possible via [employer] and messaged RT to assist.  RN was able to come in and evaluate patient and she was able to communicate appropriately.   07/07/20- patient is in bed resting. She is clinically better today then yesterday. Resting in bed in no distress.  07/08/20- Patient is lucid and awake.  Son is at bedside we discussed care plan.  CXR in am.   07/09/20- patient resting in bed comfortbably no acute events overnight.   PHYSICAL EXAMINATION   Vital Signs: Temp:  [97.3 F (36.3 C)-99.4 F (37.4 C)] 99.4 F (37.4 C) (01/16 0839) Pulse Rate:  [90-109] 109 (01/16 0839) Resp:  [16-20] 18 (01/16 0839) BP: (124-171)/(79-102) 154/102 (01/16 0839) SpO2:  [90 %-97 %] 91 % (01/16 0839) FiO2 (%):  [28 %] 28 % (01/16 0714)   FiO2 (%):  [28 %] 28 %   Constitutional: anxious frail woman lying in bed  Eyes: eomi, pupils equal Ears, nose, mouth, and throat: trach in place, minmal secretions Cardiovascular: RRR,  Ext warm Respiratory: diminished with prolonged expiratory phase, occasional accessory muscle use Gastrointestinal: soft, abd wrapped after PEG, some mild  tenderness at PEG insertion site MSK: muscle wasting Skin: No rashes, normal turgor, scattered bruising Neurologic: moves all 4 ext to command, weak Psychiatric: anxious, answering questions appropriately   Scheduled Meds: . sodium chloride   Intravenous Once  . amoxicillin-clavulanate  1 tablet Per Tube Q12H  . apixaban  5 mg Per Tube BID  . arformoterol  15 mcg Nebulization BID  . chlorhexidine gluconate (MEDLINE KIT)  15 mL Mouth Rinse BID  . Chlorhexidine Gluconate Cloth  6 each Topical Daily  . clonazePAM  1 mg Per Tube BID  . cyanocobalamin  1,000 mcg Intramuscular Q1500  . feeding supplement (OSMOLITE 1.2 CAL)  237 mL Per Tube 6 X Daily  . feeding supplement (PROSource TF)  45 mL Per Tube Daily  . fentaNYL  1 patch Transdermal Q72H  . free water  60 mL Per Tube 6 X Daily  . gabapentin  300 mg Per Tube TID  . glycopyrrolate  1 mg Per Tube BID  . insulin aspart  0-6 Units Subcutaneous Q4H  . metoprolol tartrate  50 mg Per Tube BID  . pantoprazole (PROTONIX) IV  40 mg Intravenous QHS  . QUEtiapine  25 mg Per Tube QHS  . sodium chloride flush  10-40 mL Intracatheter Q12H  . sodium chloride flush  3 mL Intravenous Q12H   Continuous Infusions: . sodium chloride Stopped (06/25/20 1842)  . sodium chloride     PRN Meds:.sodium chloride, acetaminophen (TYLENOL) oral liquid 160 mg/5 mL, diazepam, ipratropium-albuterol, labetalol, metoprolol tartrate, naphazoline-glycerin, ondansetron (ZOFRAN) IV, oxyCODONE, polyethylene glycol, sodium chloride flush, sodium chloride flush    ASSESSMENT AND PLAN   Acute on chronic hypoxemic and hypercapnic respiratory failure thought secondary  to polypharmacy with inability to wean from ventilator due to muscular deconditioning now s/p tracheostomy.  Dysphagia, moderate protein calorie malnutrition- now s/p PEG  Severe baseline COPD- complicated trach wean  Muscular deconditioning  Chronic anxiety/depression  Metabolic  encephalopathy/agitation- improving but remains on precedex gtt - repeat tracheal aspirate ordered - - Switched from standing duonebs from duonebs to brovana/yupelri - PT/OT up to chair - Continue standing clonazepam, use valium per tube for breakthrough - SLP for PMV trials - Work toward getting off IV meds so she can be candidate for rehab vs. SNF vs. LTACH   Patient chronically critically ill due to respiratory failure, metabolic encephalopathy Interventions to address this today weaning precedex, weaning vent Risk of deterioration without these interventions is high    Patient would benefit from [facility]. *This note was dictated using voice recognition software/Dragon.  Despite best efforts to proofread, errors can occur which can change the meaning.  Any change was purely unintentional.     Ottie Glazier, M.D.  Pulmonary & [place]

## 2020-07-09 NOTE — Progress Notes (Signed)
PROGRESS NOTE    [name]  AYT:[id number] DOB: [birth year] DOA: 06/06/2020 PCP: Idelle Crouch, MD   Chief complaint shortness of breath Brief Narrative:  Virginia [name] a 70 y.o.femalesevere COPD admitted with acute on chronic hypoxic hypercapnic respiratory failure and acute encephalopathy secondary to narcotic use and AECOPD requiring mechanical intubation.She was treated with steroids and empiric IV antibiotics. She could not be liberated from the ventilator so tracheostomy was performed and she has been receiving oxygen via trach collar. Now s/p trach/PEG working on trach weaning. 1/15.Patient developed worsening hypoxemia, chest x-ray showed new bilateral lower lobe infiltrates consistent with aspiration pneumonia. Zosyn started on 1/13. Changed to Augumentin 1/15.   Assessment & Plan:   Active Problems:   AF (paroxysmal atrial fibrillation) (HCC)   Dysphagia   Respiratory failure (HCC)   COPD exacerbation (HCC)   Acute urinary retention   Acute metabolic encephalopathy   Acute on chronic respiratory failure with hypoxia and hypercapnia (HCC)   Aspiration pneumonia of both lower lobes due to gastric secretions ([place])  #1. Acute on chronic respiratory failure with hypoxemia and hypercapnia. COPD exacerbation. Status post tracheostomy and PEG tube placement.  Aspiration pneumonia. Patient condition is stable again.  She is a less shortness of breath today.  Mucus production is better.  Antibiotic switched to oral yesterday.  Last dose 1/18.  #2.  Dysphagia. Moderate protein calorie malnutrition. Deconditioning. Patient is still n.p.o. per speech therapy, continue tube feeding.  Continue PT and OT.  3.  Acute metabolic encephalopathy Condition improved  #4.  Paroxysmal atrial fibrillation. Continue Eliquis.  5.  Anemia of chronic disease.  With borderline B12 level. Continue B12 injections.    DVT prophylaxis: Eliquis Code Status:  Full Family Communication:  Disposition Plan:  .   Status is: Inpatient  Remains inpatient appropriate because:Inpatient level of care appropriate due to severity of illness   Dispo: The patient is from: Home              Anticipated d/c is to: SNF              Anticipated d/c date is: 3 days              Patient currently is not medically stable to d/c.        I/O last 3 completed shifts: In: 480 [P.O.:480] Out: 12 [Urine:6; Stool:6] No intake/output data recorded.     Consultants:   Pulm  Procedures: PEG, Trach  Antimicrobials: Augmentin.  Subjective: Patient feels better today.  Less short of breath, less mucus from suction.  She has a strong cough today. No fever or chills. She has some loose stools yesterday, improved after discontinuation of Zosyn. No abdominal pain or nausea vomiting. No dysuria hematuria.  Objective: Vitals:   07/08/20 2345 07/09/20 0421 07/09/20 0449 07/09/20 0839  BP: 124/79 (!) 154/97 (!) 130/92 (!) 154/102  Pulse: 90 100 99 (!) 109  Resp: '16 20 16 18  ' Temp: (!) 97.3 F (36.3 C) 98.4 F (36.9 C) 98.1 F (36.7 C) 99.4 F (37.4 C)  TempSrc:  Oral    SpO2: 93% 90% 95% 91%  Weight:      Height:        Intake/Output Summary (Last 24 hours) at 07/09/2020 0926 Last data filed at 07/08/2020 1800 Gross per 24 hour  Intake 480 ml  Output 10 ml  Net 470 ml   Filed Weights   07/05/20 0140 07/06/20 0609 07/07/20 0104  Weight: 55  kg 50.3 kg 53.5 kg    Examination:  General exam: Appears calm and comfortable  Respiratory system: Decreased breathing sounds. Respiratory effort normal. Cardiovascular system: Regular. No JVD, murmurs, rubs, gallops or clicks. No pedal edema. Gastrointestinal system: Abdomen is nondistended, soft and nontender. No organomegaly or masses felt. Normal bowel sounds heard. Central nervous system: Alert and oriented. No focal neurological deficits. Extremities: Symmetric 5 x 5 power. Skin: No rashes,  lesions or ulcers Psychiatry: Judgement and insight appear normal. Mood & affect appropriate.     Data Reviewed: I have personally reviewed following labs and imaging studies  CBC: Recent Labs  Lab 07/03/20 0438 07/07/20 0855 07/08/20 0616  WBC 6.5 7.1 9.7  NEUTROABS  --  5.1 6.7  HGB 8.1* 7.9* 8.2*  HCT 26.5* 25.6* 26.3*  MCV 97.8 93.1 94.9  PLT 361 389 546*   Basic Metabolic Panel: Recent Labs  Lab 07/03/20 0438 07/04/20 0516 07/05/20 1022 07/06/20 0142 07/07/20 0855  NA  --   --   --   --  140  K  --   --   --   --  3.7  CL  --   --   --   --  94*  CO2  --   --   --   --  37*  GLUCOSE  --   --   --   --  104*  BUN  --   --   --   --  16  CREATININE  --   --   --   --  0.88  CALCIUM  --   --   --   --  8.8*  MG 2.0 2.0 1.8 1.9 1.9  PHOS 4.4 5.6* 4.9* 4.5 4.3   GFR: Estimated Creatinine Clearance: 47.7 mL/min (by C-G formula based on SCr of 0.88 mg/dL). Liver Function Tests: No results for input(s): AST, ALT, ALKPHOS, BILITOT, PROT, ALBUMIN in the last 168 hours. No results for input(s): LIPASE, AMYLASE in the last 168 hours. No results for input(s): AMMONIA in the last 168 hours. Coagulation Profile: No results for input(s): INR, PROTIME in the last 168 hours. Cardiac Enzymes: No results for input(s): CKTOTAL, CKMB, CKMBINDEX, TROPONINI in the last 168 hours. BNP (last 3 results) No results for input(s): PROBNP in the last 8760 hours. HbA1C: No results for input(s): HGBA1C in the last 72 hours. CBG: Recent Labs  Lab 07/08/20 1201 07/08/20 1706 07/08/20 1955 07/09/20 0021 07/09/20 0408  GLUCAP 128* 104* 119* 107* 101*   Lipid Profile: No results for input(s): CHOL, HDL, LDLCALC, TRIG, CHOLHDL, LDLDIRECT in the last 72 hours. Thyroid Function Tests: No results for input(s): TSH, T4TOTAL, FREET4, T3FREE, THYROIDAB in the last 72 hours. Anemia Panel: Recent Labs    07/07/20 0855  VITAMINB12 265  TIBC 228*  IRON 25*   Sepsis Labs: Recent Labs   Lab 07/07/20 0855  PROCALCITON <0.10    No results found for this or any previous visit (from the past 240 hour(s)).       Radiology Studies: DG Chest Port 1 View  Result Date: 07/08/2020 CLINICAL DATA:  Shortness of breath EXAM: PORTABLE CHEST 1 VIEW COMPARISON:  Chest radiograph 07/06/2020 FINDINGS: Stable cardiomediastinal contours. A right upper extremity PICC tip projects over the right atrium. Tracheostomy in place. Emphysema. There are scattered small bilateral opacities which appear stable to slightly decreased from prior, possibly representing infection and or atelectasis. No new focal consolidation. No pneumothorax or large pleural effusion. Status post  right shoulder replacement. IMPRESSION: 1. Scattered small bilateral opacities appear stable to slightly decreased from prior, possibly representing infection and/or atelectasis. 2. Redemonstrated right upper extremity PICC with tip projecting over the right atrium. Electronically Signed   By: Audie Pinto M.D.   On: 07/08/2020 18:59        Scheduled Meds: . sodium chloride   Intravenous Once  . amoxicillin-clavulanate  1 tablet Per Tube Q12H  . apixaban  5 mg Per Tube BID  . arformoterol  15 mcg Nebulization BID  . chlorhexidine gluconate (MEDLINE KIT)  15 mL Mouth Rinse BID  . Chlorhexidine Gluconate Cloth  6 each Topical Daily  . clonazePAM  1 mg Per Tube BID  . feeding supplement (OSMOLITE 1.2 CAL)  237 mL Per Tube 6 X Daily  . feeding supplement (PROSource TF)  45 mL Per Tube Daily  . fentaNYL  1 patch Transdermal Q72H  . free water  60 mL Per Tube 6 X Daily  . gabapentin  300 mg Per Tube TID  . glycopyrrolate  1 mg Per Tube BID  . insulin aspart  0-6 Units Subcutaneous Q4H  . metoprolol tartrate  50 mg Per Tube BID  . pantoprazole (PROTONIX) IV  40 mg Intravenous QHS  . QUEtiapine  25 mg Per Tube QHS  . sodium chloride flush  10-40 mL Intracatheter Q12H  . sodium chloride flush  3 mL Intravenous Q12H    Continuous Infusions: . sodium chloride Stopped (06/25/20 1842)  . sodium chloride       LOS: 33 days    Time spent: 28 minutes    Sharen Hones, MD [facility]   To contact the attending provider between 7A-7P or the covering provider during after hours 7P-7A, please log into the web site [email or web address] and access using universal [place] password for that web site. If you do not have the password, please call the hospital operator.  07/09/2020, 9:26 AM

## 2020-07-10 DIAGNOSIS — J9622 Acute and chronic respiratory failure with hypercapnia: Secondary | ICD-10-CM | POA: Diagnosis not present

## 2020-07-10 DIAGNOSIS — J690 Pneumonitis due to inhalation of food and vomit: Secondary | ICD-10-CM | POA: Diagnosis not present

## 2020-07-10 DIAGNOSIS — J9621 Acute and chronic respiratory failure with hypoxia: Secondary | ICD-10-CM | POA: Diagnosis not present

## 2020-07-10 DIAGNOSIS — J441 Chronic obstructive pulmonary disease with (acute) exacerbation: Secondary | ICD-10-CM | POA: Diagnosis not present

## 2020-07-10 LAB — PHOSPHORUS: Phosphorus: 3.8 mg/dL (ref 2.5–4.6)

## 2020-07-10 LAB — CBC WITH DIFFERENTIAL/PLATELET
Abs Immature Granulocytes: 0.3 10*3/uL — ABNORMAL HIGH (ref 0.00–0.07)
Basophils Absolute: 0.1 10*3/uL (ref 0.0–0.1)
Basophils Relative: 1 %
Eosinophils Absolute: 0.2 10*3/uL (ref 0.0–0.5)
Eosinophils Relative: 2 %
HCT: 26.1 % — ABNORMAL LOW (ref 36.0–46.0)
Hemoglobin: 7.6 g/dL — ABNORMAL LOW (ref 12.0–15.0)
Immature Granulocytes: 3 %
Lymphocytes Relative: 18 %
Lymphs Abs: 1.7 10*3/uL (ref 0.7–4.0)
MCH: 28.1 pg (ref 26.0–34.0)
MCHC: 29.1 g/dL — ABNORMAL LOW (ref 30.0–36.0)
MCV: 96.7 fL (ref 80.0–100.0)
Monocytes Absolute: 0.8 10*3/uL (ref 0.1–1.0)
Monocytes Relative: 8 %
Neutro Abs: 6.6 10*3/uL (ref 1.7–7.7)
Neutrophils Relative %: 68 %
Platelets: 403 10*3/uL — ABNORMAL HIGH (ref 150–400)
RBC: 2.7 MIL/uL — ABNORMAL LOW (ref 3.87–5.11)
RDW: 15.2 % (ref 11.5–15.5)
WBC: 9.7 10*3/uL (ref 4.0–10.5)
nRBC: 0.9 % — ABNORMAL HIGH (ref 0.0–0.2)

## 2020-07-10 LAB — BASIC METABOLIC PANEL
Anion gap: 7 (ref 5–15)
BUN: 16 mg/dL (ref 8–23)
CO2: 36 mmol/L — ABNORMAL HIGH (ref 22–32)
Calcium: 8.9 mg/dL (ref 8.9–10.3)
Chloride: 97 mmol/L — ABNORMAL LOW (ref 98–111)
Creatinine, Ser: 0.58 mg/dL (ref 0.44–1.00)
GFR, Estimated: >60 mL/min (ref >60)
Glucose, Bld: 90 mg/dL (ref 70–99)
Potassium: 4.4 mmol/L (ref 3.5–5.1)
Sodium: 140 mmol/L (ref 135–145)

## 2020-07-10 LAB — GLUCOSE, CAPILLARY
Glucose-Capillary: 108 mg/dL — ABNORMAL HIGH (ref 70–99)
Glucose-Capillary: 110 mg/dL — ABNORMAL HIGH (ref 70–99)
Glucose-Capillary: 111 mg/dL — ABNORMAL HIGH (ref 70–99)
Glucose-Capillary: 127 mg/dL — ABNORMAL HIGH (ref 70–99)
Glucose-Capillary: 89 mg/dL (ref 70–99)
Glucose-Capillary: 90 mg/dL (ref 70–99)
Glucose-Capillary: 93 mg/dL (ref 70–99)
Glucose-Capillary: 95 mg/dL (ref 70–99)
Glucose-Capillary: 97 mg/dL (ref 70–99)
Glucose-Capillary: 97 mg/dL (ref 70–99)

## 2020-07-10 LAB — MAGNESIUM: Magnesium: 1.9 mg/dL (ref 1.7–2.4)

## 2020-07-10 LAB — HOMOCYSTEINE: Homocysteine: 9.5 umol/L (ref 0.0–17.2)

## 2020-07-10 MED ORDER — SODIUM CHLORIDE 0.9 % IV SOLN
300.0000 mg | Freq: Once | INTRAVENOUS | Status: DC
  Filled 2020-07-10: qty 15

## 2020-07-10 NOTE — Progress Notes (Signed)
PROGRESS NOTE    Gracianna D Tep  MRN:[id number] DOB: 07/29/1950 DOA: 06/06/2020 PCP: Sparks, Jeffrey D, MD   Chief complaint.  Shortness of breath Brief Narrative:  Virginia Mullenis a 70 y.o.femalesevere COPD admitted with acute on chronic hypoxic hypercapnic respiratory failure and acute encephalopathy secondary to narcotic use and AECOPD requiring mechanical intubation.She was treated with steroids and empiric IV antibiotics. She could not be liberated from the ventilator so tracheostomy was performed and she has been receiving oxygen via trach collar. Now s/p trach/PEG working on trach weaning. 1/15.Patient developed worsening hypoxemia, chest x-ray showed new bilateral lower lobe infiltrates consistent with aspiration pneumonia. Zosyn started on 1/13. Changed to Augumentin 1/15.   Assessment & Plan:   Active Problems:   AF (paroxysmal atrial fibrillation) (HCC)   Dysphagia   Respiratory failure (HCC)   COPD exacerbation (HCC)   Acute urinary retention   Acute metabolic encephalopathy   Acute on chronic respiratory failure with hypoxia and hypercapnia (HCC)   Aspiration pneumonia of both lower lobes due to gastric secretions (HCC)   #1.Acute on chronic respiratory failure with hypoxemia and hypercapnia. COPD exacerbation. Status post tracheostomy and PEG tube placement. Aspiration pneumonia. She is doing well.  Continue Augmentin, discontinue on 1/18.  #2.  Dysphagia. Deconditioning. Moderate protein calorie malnutrition  Continue tube feeding.  Pending transfer to LTACH  #3.  Metabolic encephalopathy P Resolved  4.  Anemia of chronic disease. Borderline B12 level, continue B12 injection. She also has borderline iron level, will give IV iron for 1 dose  5.  Paroxysmal atrial fibrillation. Continue Eliquis.  DVT prophylaxis: Eliquis Code Status: Full Family Communication:  Disposition Plan:  .   Status is: Inpatient  Remains inpatient  appropriate because:Inpatient level of care appropriate due to severity of illness   Dispo: The patient is from: Home              Anticipated d/c is to: LTAC              Anticipated d/c date is: 1 day              Patient currently is not medically stable to d/c.        I/O last 3 completed shifts: In: -  Out: 400 [Urine:400] No intake/output data recorded.     Consultants:  Pulm Procedures: Trach and PEG  Antimicrobials: Augmentin  Subjective: Patient much better today.  No significant short of breath.  Oxygenation is improving. No fever or chills. No abdominal pain or nausea vomiting.  Diarrhea better after discontinuation of Zosyn.   Objective: Vitals:   07/10/20 0146 07/10/20 0427 07/10/20 0500 07/10/20 0753  BP: 112/82 113/81  (!) 157/96  Pulse: 83 79  100  Resp: (!) 28 (!) 28  20  Temp: 98.6 F (37 C) 99.1 F (37.3 C)  98.5 F (36.9 C)  TempSrc:  Oral  Oral  SpO2: 95% 96%  93%  Weight:   52 kg   Height:        Intake/Output Summary (Last 24 hours) at 07/10/2020 1030 Last data filed at 07/09/2020 1834 Gross per 24 hour  Intake -  Output 400 ml  Net -400 ml   Filed Weights   07/06/20 0609 07/07/20 0104 07/10/20 0500  Weight: 50.3 kg 53.5 kg 52 kg    Examination:  General exam: Appears calm and comfortable  Respiratory system: Decreased breathing sounds. Respiratory effort normal. Cardiovascular system: S1 & S2 heard, RRR. No JVD, murmurs, rubs,   gallops or clicks. No pedal edema. Gastrointestinal system: Abdomen is nondistended, soft and nontender. No organomegaly or masses felt. Normal bowel sounds heard. Central nervous system: Alert and oriented. No focal neurological deficits. Extremities: Symmetric 5 x 5 power. Skin: No rashes, lesions or ulcers Psychiatry: Judgement and insight appear normal. Mood & affect appropriate.     Data Reviewed: I have personally reviewed following labs and imaging studies  CBC: Recent Labs  Lab  07/07/20 0855 07/08/20 0616 07/10/20 0609  WBC 7.1 9.7 9.7  NEUTROABS 5.1 6.7 6.6  HGB 7.9* 8.2* 7.6*  HCT 25.6* 26.3* 26.1*  MCV 93.1 94.9 96.7  PLT 389 416* 403*   Basic Metabolic Panel: Recent Labs  Lab 07/04/20 0516 07/05/20 1022 07/06/20 0142 07/07/20 0855 07/10/20 0609  NA  --   --   --  140 140  K  --   --   --  3.7 4.4  CL  --   --   --  94* 97*  CO2  --   --   --  37* 36*  GLUCOSE  --   --   --  104* 90  BUN  --   --   --  16 16  CREATININE  --   --   --  0.88 0.58  CALCIUM  --   --   --  8.8* 8.9  MG 2.0 1.8 1.9 1.9 1.9  PHOS 5.6* 4.9* 4.5 4.3 3.8   GFR: Estimated Creatinine Clearance: 52.5 mL/min (by C-G formula based on SCr of 0.58 mg/dL). Liver Function Tests: No results for input(s): AST, ALT, ALKPHOS, BILITOT, PROT, ALBUMIN in the last 168 hours. No results for input(s): LIPASE, AMYLASE in the last 168 hours. No results for input(s): AMMONIA in the last 168 hours. Coagulation Profile: No results for input(s): INR, PROTIME in the last 168 hours. Cardiac Enzymes: No results for input(s): CKTOTAL, CKMB, CKMBINDEX, TROPONINI in the last 168 hours. BNP (last 3 results) No results for input(s): PROBNP in the last 8760 hours. HbA1C: No results for input(s): HGBA1C in the last 72 hours. CBG: Recent Labs  Lab 07/09/20 2129 07/10/20 0009 07/10/20 0021 07/10/20 0422 07/10/20 0747  GLUCAP 97 95 89 97 93   Lipid Profile: No results for input(s): CHOL, HDL, LDLCALC, TRIG, CHOLHDL, LDLDIRECT in the last 72 hours. Thyroid Function Tests: No results for input(s): TSH, T4TOTAL, FREET4, T3FREE, THYROIDAB in the last 72 hours. Anemia Panel: No results for input(s): VITAMINB12, FOLATE, FERRITIN, TIBC, IRON, RETICCTPCT in the last 72 hours. Sepsis Labs: Recent Labs  Lab 07/07/20 0855  PROCALCITON <0.10    No results found for this or any previous visit (from the past 240 hour(s)).       Radiology Studies: DG Chest Port 1 View  Result Date:  07/08/2020 CLINICAL DATA:  Shortness of breath EXAM: PORTABLE CHEST 1 VIEW COMPARISON:  Chest radiograph 07/06/2020 FINDINGS: Stable cardiomediastinal contours. A right upper extremity PICC tip projects over the right atrium. Tracheostomy in place. Emphysema. There are scattered small bilateral opacities which appear stable to slightly decreased from prior, possibly representing infection and or atelectasis. No new focal consolidation. No pneumothorax or large pleural effusion. Status post right shoulder replacement. IMPRESSION: 1. Scattered small bilateral opacities appear stable to slightly decreased from prior, possibly representing infection and/or atelectasis. 2. Redemonstrated right upper extremity PICC with tip projecting over the right atrium. Electronically Signed   By: Nancy  Ballantyne M.D.   On: 07/08/2020 18:59          Scheduled Meds: . sodium chloride   Intravenous Once  . amoxicillin-clavulanate  1 tablet Per Tube Q12H  . apixaban  5 mg Per Tube BID  . arformoterol  15 mcg Nebulization BID  . chlorhexidine gluconate (MEDLINE KIT)  15 mL Mouth Rinse BID  . Chlorhexidine Gluconate Cloth  6 each Topical Daily  . clonazePAM  1 mg Per Tube BID  . cyanocobalamin  1,000 mcg Intramuscular Q1500  . feeding supplement (OSMOLITE 1.2 CAL)  237 mL Per Tube 6 X Daily  . feeding supplement (PROSource TF)  45 mL Per Tube Daily  . fentaNYL  1 patch Transdermal Q72H  . free water  60 mL Per Tube 6 X Daily  . gabapentin  300 mg Per Tube TID  . glycopyrrolate  1 mg Per Tube BID  . insulin aspart  0-6 Units Subcutaneous Q4H  . metoprolol tartrate  50 mg Per Tube BID  . pantoprazole (PROTONIX) IV  40 mg Intravenous QHS  . QUEtiapine  25 mg Per Tube QHS  . sodium chloride flush  10-40 mL Intracatheter Q12H  . sodium chloride flush  3 mL Intravenous Q12H   Continuous Infusions: . sodium chloride Stopped (06/25/20 1842)  . sodium chloride    . iron sucrose       LOS: 34 days    Time spent:  26 minutes    Sharen Hones, MD [facility]   To contact the attending provider between 7A-7P or the covering provider during after hours 7P-7A, please log into the web site [email or web address] and access using universal [place] password for that web site. If you do not have the password, please call the hospital operator.  07/10/2020, 10:30 AM

## 2020-07-10 NOTE — TOC Progression Note (Addendum)
Transition of Care [facility]) - Progression Note    Patient Details  Name: ANTIA RAHAL MRN: [id number] Date of Birth: July 17, 1950  [facility] CM/SW Contact  Liliana Cline, LCSW Phone Number: 07/10/2020, 8:40 AM  Clinical Narrative:   CSW reached out to [place] with [facility] again for update. Per Dorna Bloom closed today, may have to start a new authorization since patient now has pneumonia and is a "change in condition"      Barriers to Discharge: Continued Medical Work up  Expected Discharge Plan and Services   In-house Referral: Clinical Social Work,Hospice / Palliative Care   Post Acute Care Choice: Durable Medical Equipment (Oxygen 4L) Living arrangements for the past 2 months: Apartment                                       Social Determinants of Health (SDOH) Interventions    Readmission Risk Interventions No flowsheet data found.

## 2020-07-10 NOTE — Progress Notes (Signed)
PULMONARY PROGRESS NOTE    Name: Virginia [name] MRN: [id number] DOB: [birth year]     LOS: 37   SUBJECTIVE FINDINGS & SIGNIFICANT EVENTS    Patient description:  70 yo female with severe COPD admitted with acute on chronic hypoxic hypercapnic respiratory failure and acute encephalopathy secondary to narcotic use and AECOPD requiring mechanical intubation  Now s/p trach/PEG working on trach weaning.  On TC at present, anxious. Remains on precedex.  06/27/20- patient is improved.  Signed out to [facility] - Dr Jimmye [place] for pick up in am.   06/29/20- patient is with respiratory distress, she has high volume phlegm from trache, ive suctioned out whats possible via [place] and messaged RT to assist.  RN was able to come in and evaluate patient and she was able to communicate appropriately.   07/07/20- patient is in bed resting. She is clinically better today then yesterday. Resting in bed in no distress.  07/08/20- Patient is lucid and awake.  Son is at bedside we discussed care plan.  CXR in am.   1/16-17/22- patient resting in bed comfortbably no acute events overnight.   PHYSICAL EXAMINATION   Vital Signs: Temp:  [98.5 F (36.9 C)-99.1 F (37.3 C)] 98.5 F (36.9 C) (01/17 0753) Pulse Rate:  [79-111] 100 (01/17 0753) Resp:  [18-28] 20 (01/17 0753) BP: (112-167)/(81-104) 157/96 (01/17 0753) SpO2:  [92 %-100 %] 93 % (01/17 0753) FiO2 (%):  [28 %] 28 % (01/16 2008) Weight:  [52 kg] 52 kg (01/17 0500)   FiO2 (%):  [28 %] 28 %   Constitutional: anxious frail woman lying in bed  Eyes: eomi, pupils equal Ears, nose, mouth, and throat: trach in place, minmal secretions Cardiovascular: RRR,  Ext warm Respiratory: diminished with prolonged expiratory phase, occasional accessory muscle use Gastrointestinal: soft,  abd wrapped after PEG, some mild tenderness at PEG insertion site MSK: muscle wasting Skin: No rashes, normal turgor, scattered bruising Neurologic: moves all 4 ext to command, weak Psychiatric: anxious, answering questions appropriately   Scheduled Meds: . sodium chloride   Intravenous Once  . amoxicillin-clavulanate  1 tablet Per Tube Q12H  . apixaban  5 mg Per Tube BID  . arformoterol  15 mcg Nebulization BID  . chlorhexidine gluconate (MEDLINE KIT)  15 mL Mouth Rinse BID  . Chlorhexidine Gluconate Cloth  6 each Topical Daily  . clonazePAM  1 mg Per Tube BID  . cyanocobalamin  1,000 mcg Intramuscular Q1500  . feeding supplement (OSMOLITE 1.2 CAL)  237 mL Per Tube 6 X Daily  . feeding supplement (PROSource TF)  45 mL Per Tube Daily  . fentaNYL  1 patch Transdermal Q72H  . free water  60 mL Per Tube 6 X Daily  . gabapentin  300 mg Per Tube TID  . glycopyrrolate  1 mg Per Tube BID  . insulin aspart  0-6 Units Subcutaneous Q4H  . metoprolol tartrate  50 mg Per Tube BID  . pantoprazole (PROTONIX) IV  40 mg Intravenous QHS  . QUEtiapine  25 mg Per Tube QHS  . sodium chloride flush  10-40 mL Intracatheter Q12H  . sodium chloride flush  3 mL Intravenous Q12H   Continuous Infusions: . sodium chloride Stopped (06/25/20 1842)  . sodium chloride    . iron sucrose     PRN Meds:.sodium chloride, acetaminophen (TYLENOL) oral liquid 160 mg/5 mL, diazepam, ipratropium-albuterol, labetalol, metoprolol tartrate, naphazoline-glycerin, ondansetron (ZOFRAN) IV, oxyCODONE, polyethylene glycol, sodium chloride flush, sodium chloride flush    ASSESSMENT  AND PLAN   Acute on chronic hypoxemic and hypercapnic respiratory failure thought secondary to polypharmacy with inability to wean from ventilator due to muscular deconditioning now s/p tracheostomy.  Dysphagia, moderate protein calorie malnutrition- now s/p PEG  Severe baseline COPD- complicated trach wean  Muscular deconditioning  Chronic  anxiety/depression  Metabolic encephalopathy/agitation- improving but remains on precedex gtt - repeat tracheal aspirate ordered - - Switched from standing duonebs from duonebs to brovana/yupelri - PT/OT up to chair - Continue standing clonazepam, use valium per tube for breakthrough - SLP for PMV trials - Work toward getting off IV meds so she can be candidate for rehab vs. SNF vs. LTACH   Patient chronically critically ill due to respiratory failure, metabolic encephalopathy Interventions to address this today weaning precedex, weaning vent Risk of deterioration without these interventions is high    Patient would benefit from [facility]. *This note was dictated using voice recognition software/Dragon.  Despite best efforts to proofread, errors can occur which can change the meaning.  Any change was purely unintentional.     Ottie Glazier, M.D.  Pulmonary & [place]

## 2020-07-11 DIAGNOSIS — J690 Pneumonitis due to inhalation of food and vomit: Secondary | ICD-10-CM | POA: Diagnosis not present

## 2020-07-11 DIAGNOSIS — J441 Chronic obstructive pulmonary disease with (acute) exacerbation: Secondary | ICD-10-CM | POA: Diagnosis not present

## 2020-07-11 DIAGNOSIS — J9621 Acute and chronic respiratory failure with hypoxia: Secondary | ICD-10-CM | POA: Diagnosis not present

## 2020-07-11 DIAGNOSIS — K591 Functional diarrhea: Secondary | ICD-10-CM

## 2020-07-11 DIAGNOSIS — G9341 Metabolic encephalopathy: Secondary | ICD-10-CM | POA: Diagnosis not present

## 2020-07-11 LAB — HOMOCYSTEINE: Homocysteine: 8.4 umol/L (ref 0.0–17.2)

## 2020-07-11 LAB — GLUCOSE, CAPILLARY
Glucose-Capillary: 107 mg/dL — ABNORMAL HIGH (ref 70–99)
Glucose-Capillary: 108 mg/dL — ABNORMAL HIGH (ref 70–99)
Glucose-Capillary: 113 mg/dL — ABNORMAL HIGH (ref 70–99)
Glucose-Capillary: 91 mg/dL (ref 70–99)
Glucose-Capillary: 97 mg/dL (ref 70–99)

## 2020-07-11 MED ORDER — LOPERAMIDE HCL 2 MG PO CAPS
2.0000 mg | ORAL_CAPSULE | Freq: Two times a day (BID) | ORAL | Status: DC
  Administered 2020-07-11 (×2): 2 mg via ORAL
  Filled 2020-07-11 (×2): qty 1

## 2020-07-11 MED ORDER — LOPERAMIDE HCL 1 MG/7.5ML PO SUSP
2.0000 mg | Freq: Two times a day (BID) | ORAL | Status: DC
  Administered 2020-07-11: 2 mg via ORAL
  Filled 2020-07-11 (×2): qty 15

## 2020-07-11 MED ORDER — SODIUM CHLORIDE 0.9 % IV SOLN: INTRAVENOUS | Status: AC

## 2020-07-11 NOTE — Progress Notes (Signed)
Physical Therapy Treatment Patient Details Name: Virginia [name] MRN: [id number] DOB: 1950-11-29 Today's Date: 07/11/2020    History of Present Illness 70 yo female with severe COPD admitted with acute on chronic hypoxic hypercapnic respiratory failure and acute encephalopathy secondary to narcotic use and AECOPD requiring mechanical intubation. Initially intubated from 12/14-12/16 and then again from 12/18-12/23. Now with + trach collar. Recent hospitalization from 12/10-12/12/21.    PT Comments    Pt in bed, had removed bedpan and urine spilt in bed.  Pt generally anxious.  Had Passy device on and removed it on her own.  Rolling left/right to change linens and provide care as she was uncomfortable in wet linens and it seemed to be making her more anxious.  Pt remained generally anxious and sats 70-low 90's.  Pt very anxious and stated she does not feel good.  RN called and into room to check.  Respiratory therapist called and in with pt to take over care.  Unable to tolerate true therapy today.   Follow Up Recommendations  SNF     Equipment Recommendations  None recommended by PT    Recommendations for Other Services       Precautions / Restrictions Precautions Precautions: Fall Precaution Comments: trach Restrictions Weight Bearing Restrictions: No Other Position/Activity Restrictions: watch sats    Mobility  Bed Mobility Overal bed mobility: Needs Assistance Bed Mobility: Rolling Rolling: Supervision            Transfers                    Ambulation/Gait                 Stairs             Wheelchair Mobility    Modified Rankin (Stroke Patients Only)       Balance                                            Cognition Arousal/Alertness: Awake/alert Behavior During Therapy: WFL for tasks assessed/performed Overall Cognitive Status: Within Functional Limits for tasks assessed                                         Exercises Other Exercises Other Exercises: linen change due to inc urine/spilled bedpan    General Comments        Pertinent Vitals/Pain Pain Assessment: No/denies pain    Home Living                      Prior Function            PT Goals (current goals can now be found in the care plan section) Progress towards PT goals: Progressing toward goals    Frequency    Min 2X/week      PT Plan Current plan remains appropriate    Co-evaluation              AM-PAC PT "6 Clicks" Mobility   Outcome Measure  Help needed turning from your back to your side while in a flat bed without using bedrails?: A Little Help needed moving from lying on your back to sitting on the side of a flat bed without using bedrails?: A Little Help needed  moving to and from a bed to a chair (including a wheelchair)?: A Lot Help needed standing up from a chair using your arms (e.g., wheelchair or bedside chair)?: A Little Help needed to walk in hospital room?: Total Help needed climbing 3-5 steps with a railing? : Total 6 Click Score: 13    End of Session Equipment Utilized During Treatment: Oxygen Activity Tolerance: Treatment limited secondary to medical complications (Comment) Patient left: in bed;with call bell/phone within reach;with bed alarm set;with nursing/sitter in room         Time: 9476-5465 PT Time Calculation (min) (ACUTE ONLY): 25 min  Charges:  $Therapeutic Activity: 23-37 mins                    Danielle Dess, PTA 07/11/20, 11:19 AM

## 2020-07-11 NOTE — Care Management Important Message (Signed)
Important Message  Patient Details  Name: Virginia [name] MRN: [id number] Date of Birth: 31-Dec-1950   Medicare Important Message Given:  Yes     [name] 07/11/2020, 11:50 AM

## 2020-07-11 NOTE — Progress Notes (Signed)
PULMONARY PROGRESS NOTE    Name: Virginia [name] MRN: [id number] DOB: 11-Jan-1951     LOS: 28   SUBJECTIVE FINDINGS & SIGNIFICANT EVENTS    Patient description:  70 yo female with severe COPD admitted with acute on chronic hypoxic hypercapnic respiratory failure and acute encephalopathy secondary to narcotic use and AECOPD requiring mechanical intubation  Now s/p trach/PEG working on trach weaning.  On TC at present, anxious. Remains on precedex.  06/27/20- patient is improved.  Signed out to [facility] - Dr Jimmye Norman for pick up in am.   06/29/20- patient is with respiratory distress, she has high volume phlegm from trache, ive suctioned out whats possible via [place] and messaged RT to assist.  RN was able to come in and evaluate patient and she was able to communicate appropriately.   07/07/20- patient is in bed resting. She is clinically better today then yesterday. Resting in bed in no distress.  07/08/20- Patient is lucid and awake.  Son is at bedside we discussed care plan.  CXR in am.   1/16-17/22- patient resting in bed comfortbably no acute events overnight.   07/11/20- patient had episode of mild respiratory distress due to voluminous phlegm expectorated via trache. This was suctioned by RN with recovery. Patient is clear to auscultation with occasional low pitch wheezes.  She is being optimized for d/c.   PHYSICAL EXAMINATION   Vital Signs: Temp:  [98.4 F (36.9 C)-99.9 F (37.7 C)] 98.4 F (36.9 C) (01/18 0837) Pulse Rate:  [88-103] 103 (01/18 0837) Resp:  [17-20] 17 (01/18 0837) BP: (129-159)/(90-99) 159/99 (01/18 0837) SpO2:  [92 %-98 %] 98 % (01/18 0837) FiO2 (%):  [28 %-35 %] 28 % (01/18 0727)   FiO2 (%):  [28 %-35 %] 28 %   Constitutional: anxious frail woman lying in bed  Eyes: eomi,  pupils equal Ears, nose, mouth, and throat: trach in place, minmal secretions Cardiovascular: RRR,  Ext warm Respiratory: diminished with prolonged expiratory phase, occasional accessory muscle use Gastrointestinal: soft, abd wrapped after PEG, some mild tenderness at PEG insertion site MSK: muscle wasting Skin: No rashes, normal turgor, scattered bruising Neurologic: moves all 4 ext to command, weak Psychiatric: anxious, answering questions appropriately   Scheduled Meds: . sodium chloride   Intravenous Once  . apixaban  5 mg Per Tube BID  . arformoterol  15 mcg Nebulization BID  . chlorhexidine gluconate (MEDLINE KIT)  15 mL Mouth Rinse BID  . Chlorhexidine Gluconate Cloth  6 each Topical Daily  . clonazePAM  1 mg Per Tube BID  . cyanocobalamin  1,000 mcg Intramuscular Q1500  . feeding supplement (OSMOLITE 1.2 CAL)  237 mL Per Tube 6 X Daily  . feeding supplement (PROSource TF)  45 mL Per Tube Daily  . fentaNYL  1 patch Transdermal Q72H  . free water  60 mL Per Tube 6 X Daily  . gabapentin  300 mg Per Tube TID  . glycopyrrolate  1 mg Per Tube BID  . insulin aspart  0-6 Units Subcutaneous Q4H  . loperamide  2 mg Oral BID AC  . metoprolol tartrate  50 mg Per Tube BID  . pantoprazole (PROTONIX) IV  40 mg Intravenous QHS  . QUEtiapine  25 mg Per Tube QHS  . sodium chloride flush  10-40 mL Intracatheter Q12H  . sodium chloride flush  3 mL Intravenous Q12H   Continuous Infusions: . sodium chloride Stopped (06/25/20 1842)  . sodium chloride    . sodium chloride 100  mL/hr at 07/11/20 0841  . iron sucrose     PRN Meds:.sodium chloride, acetaminophen (TYLENOL) oral liquid 160 mg/5 mL, diazepam, ipratropium-albuterol, labetalol, metoprolol tartrate, naphazoline-glycerin, ondansetron (ZOFRAN) IV, oxyCODONE, polyethylene glycol, sodium chloride flush, sodium chloride flush    ASSESSMENT AND PLAN   Acute on chronic hypoxemic and hypercapnic respiratory failure thought secondary to  polypharmacy long wean from ventilator due to muscular deconditioning now s/p tracheostomy.  Dysphagia, moderate protein calorie malnutrition- now s/p PEG  Severe baseline COPD- complicated trach wean  Muscular deconditioning  Chronic anxiety/depression  Metabolic encephalopathy/agitation- improving  - repeat tracheal aspirate ordered - - Switched from standing duonebs from duonebs to brovana/yupelri - PT/OT up to chair - Continue standing clonazepam, use valium per tube for breakthrough - SLP for PMV trials - g off IV meds for rehab vs. SNF vs. LTACH   Patient chronically critically ill due to respiratory failure, metabolic encephalopathy Interventions to address this today weaning precedex, weaning vent Risk of deterioration without these interventions is high    Patient would benefit from [facility]. *This note was dictated using voice recognition software/Dragon.  Despite best efforts to proofread, errors can occur which can change the meaning.  Any change was purely unintentional.     Ottie Glazier, M.D.  Pulmonary & [place]

## 2020-07-11 NOTE — Progress Notes (Signed)
OT Cancellation Note  Patient Details Name: Virginia [name] MRN: [id number] DOB: [birth year]   Cancelled Treatment:    Reason Eval/Treat Not Completed: Patient declined, no reason specified. Pt supine in bed with RN giving medications. Pt reports headache and requests therapist to return tomorrow. OT will follow up at next available time.  Jackquline Denmark, MS, OTR/L , CBIS ascom [phone]  07/11/20, 1:57 PM   07/11/2020, 1:57 PM

## 2020-07-11 NOTE — Progress Notes (Signed)
PROGRESS NOTE    Virginia [name]  Virginia [name] DOB: 10-23-1950 DOA: 06/06/2020 PCP: Idelle Crouch, MD   Chief complaint: shortness of breath. Brief Narrative:  Virginia [name] a 70 y.o.femalesevere COPD admitted with acute on chronic hypoxic hypercapnic respiratory failure and acute encephalopathy secondary to narcotic use and AECOPD requiring mechanical intubation.She was treated with steroids and empiric IV antibiotics. She could not be liberated from the ventilator so tracheostomy was performed and she has been receiving oxygen via trach collar. Now s/p trach/PEG working on trach weaning. 1/15.Patient developed worsening hypoxemia, chest x-ray showed new bilateral lower lobe infiltrates consistent with aspiration pneumonia. Zosyn startedon 1/13. Changed to Augumentin 1/15.    Assessment & Plan:   Active Problems:   AF (paroxysmal atrial fibrillation) (HCC)   Dysphagia   Respiratory failure (HCC)   COPD exacerbation (HCC)   Acute urinary retention   Acute metabolic encephalopathy   Acute on chronic respiratory failure with hypoxia and hypercapnia (HCC)   Aspiration pneumonia of both lower lobes due to gastric secretions ([place])  #1.Acute on chronic respiratory failure with hypoxemia and hypercapnia. COPD exacerbation. Status post tracheostomy and PEG tube placement. Aspiration pneumonia. Condition much improved.  Discontinue Augmentin.  2.  Dysphagia. Deconditioning. Moderate protein calorie malnutrition. Patient is tolerating tube feeding, pending transfer to LTAC versus nursing home.  #3.  Diarrhea. Appear to be secondary to antibiotics and tube feeding.  She had a quite a few loose stools, will give a liter of fluids.  Started Imodium 2 mg twice a day. Low suspicion for C. difficile.  4.  Metabolic encephalopathy. Condition improved.  5.  Anemia of chronic disease. Status post IV iron and B12 injection. Patient hemoglobin was low but is  stable.  Recheck a CBC tomorrow. No active bleeding.  6.  Paroxysmal atrial fibrillation. Continue Eliquis as patient does not have active bleeding.       DVT prophylaxis: Eliquis Code Status: Full Family Communication:  Disposition Plan:  .   Status is: Inpatient  Remains inpatient appropriate because:Inpatient level of care appropriate due to severity of illness   Dispo: The patient is from: Home              Anticipated d/c is to: LTAC              Anticipated d/c date is: 1 day              Patient currently is not medically stable to d/c.        I/O last 3 completed shifts: In: 714 [Other:120; NG/GT:594] Out: -  No intake/output data recorded.     Consultants:   None  Procedures: PEG, Trach  Antimicrobials: None  Subjective: Patient had a several loose stools today.  No nausea vomiting or abdominal pain.  Tolerating tube feeding. Shortness of breath much improved today.  Cough, less productive. No fever or chills. No dysuria or hematuria. No chest pain or palpitation. No headache or dizziness.  Objective: Vitals:   07/11/20 0031 07/11/20 0727 07/11/20 0837 07/11/20 1216  BP: 129/90  (!) 159/99 136/90  Pulse: 88  (!) 103 93  Resp: _0 Temp: 99.9 F (37.7 C)  98.4 F (36.9 C) 98.5 F (36.9 C)  TempSrc:      SpO2: 95% 92% 98% 100%  Weight:      Height:        Intake/Output Summary (Last 24 hours) at 07/11/2020 1255 Last data filed at 07/11/2020 [phone]  Gross per 24 hour  Intake 714 ml  Output -  Net 714 ml   Filed Weights   07/06/20 0609 07/07/20 0104 07/10/20 0500  Weight: 50.3 kg 53.5 kg 52 kg    Examination:  General exam: Appears calm and comfortable  Respiratory system: Decreased breath sounds without crackles.Marland Kitchen Respiratory effort normal. Cardiovascular system: S1 & S2 heard, RRR. No JVD, murmurs, rubs, gallops or clicks. No pedal edema. Gastrointestinal system: Abdomen is nondistended, soft and nontender. No  organomegaly or masses felt. Normal bowel sounds heard. Central nervous system: Alert and oriented. No focal neurological deficits. Extremities: Symmetric  Skin: No rashes, lesions or ulcers Psychiatry: Judgement and insight appear normal. Mood & affect appropriate.     Data Reviewed: I have personally reviewed following labs and imaging studies  CBC: Recent Labs  Lab 07/07/20 0855 07/08/20 0616 07/10/20 0609  WBC 7.1 9.7 9.7  NEUTROABS 5.1 6.7 6.6  HGB 7.9* 8.2* 7.6*  HCT 25.6* 26.3* 26.1*  MCV 93.1 94.9 96.7  PLT 389 416* 315*   Basic Metabolic Panel: Recent Labs  Lab 07/05/20 1022 07/06/20 0142 07/07/20 0855 07/10/20 0609  NA  --   --  140 140  K  --   --  3.7 4.4  CL  --   --  94* 97*  CO2  --   --  37* 36*  GLUCOSE  --   --  104* 90  BUN  --   --  16 16  CREATININE  --   --  0.88 0.58  CALCIUM  --   --  8.8* 8.9  MG 1.8 1.9 1.9 1.9  PHOS 4.9* 4.5 4.3 3.8   GFR: Estimated Creatinine Clearance: 52.5 mL/min (by C-G formula based on SCr of 0.58 mg/dL). Liver Function Tests: No results for input(s): AST, ALT, ALKPHOS, BILITOT, PROT, ALBUMIN in the last 168 hours. No results for input(s): LIPASE, AMYLASE in the last 168 hours. No results for input(s): AMMONIA in the last 168 hours. Coagulation Profile: No results for input(s): INR, PROTIME in the last 168 hours. Cardiac Enzymes: No results for input(s): CKTOTAL, CKMB, CKMBINDEX, TROPONINI in the last 168 hours. BNP (last 3 results) No results for input(s): PROBNP in the last 8760 hours. HbA1C: No results for input(s): HGBA1C in the last 72 hours. CBG: Recent Labs  Lab 07/10/20 2121 07/11/20 0028 07/11/20 0512 07/11/20 0836 07/11/20 1218  GLUCAP 97 108* 107* 91 97   Lipid Profile: No results for input(s): CHOL, HDL, LDLCALC, TRIG, CHOLHDL, LDLDIRECT in the last 72 hours. Thyroid Function Tests: No results for input(s): TSH, T4TOTAL, FREET4, T3FREE, THYROIDAB in the last 72 hours. Anemia Panel: No  results for input(s): VITAMINB12, FOLATE, FERRITIN, TIBC, IRON, RETICCTPCT in the last 72 hours. Sepsis Labs: Recent Labs  Lab 07/07/20 0855  PROCALCITON <0.10    No results found for this or any previous visit (from the past 240 hour(s)).       Radiology Studies: No results found.      Scheduled Meds: . sodium chloride   Intravenous Once  . apixaban  5 mg Per Tube BID  . arformoterol  15 mcg Nebulization BID  . chlorhexidine gluconate (MEDLINE KIT)  15 mL Mouth Rinse BID  . Chlorhexidine Gluconate Cloth  6 each Topical Daily  . clonazePAM  1 mg Per Tube BID  . cyanocobalamin  1,000 mcg Intramuscular Q1500  . feeding supplement (OSMOLITE 1.2 CAL)  237 mL Per Tube 6 X Daily  . feeding supplement (  PROSource TF)  45 mL Per Tube Daily  . fentaNYL  1 patch Transdermal Q72H  . free water  60 mL Per Tube 6 X Daily  . gabapentin  300 mg Per Tube TID  . glycopyrrolate  1 mg Per Tube BID  . insulin aspart  0-6 Units Subcutaneous Q4H  . loperamide  2 mg Oral BID AC  . metoprolol tartrate  50 mg Per Tube BID  . pantoprazole (PROTONIX) IV  40 mg Intravenous QHS  . QUEtiapine  25 mg Per Tube QHS  . sodium chloride flush  10-40 mL Intracatheter Q12H  . sodium chloride flush  3 mL Intravenous Q12H   Continuous Infusions: . sodium chloride Stopped (06/25/20 1842)  . sodium chloride    . sodium chloride 100 mL/hr at 07/11/20 0841  . iron sucrose       LOS: 35 days    Time spent: 34 minutes    Sharen Hones, MD [facility]   To contact the attending provider between 7A-7P or the covering provider during after hours 7P-7A, please log into the web site [email or web address] and access using universal [place] password for that web site. If you do not have the password, please call the hospital operator.  07/11/2020, 12:55 PM

## 2020-07-11 NOTE — TOC Progression Note (Signed)
Transition of Care [facility]) - Progression Note    Patient Details  Name: Virginia [name] MRN: [id number] Date of Birth: 1950/11/10  [facility] CM/SW Contact  Liliana Cline, LCSW Phone Number: 07/11/2020, 12:55 PM  Clinical Narrative:   Discussed case with TOC Leadership. Reached out to [facility] - Jomarie Longs reported they are on hold for admissions but do take new trach patients. Called [facility] - left VM for [employer]    Patient got trach on 12/23 per records, so 30 days would be 1/22, could look at other SNFs at that point.  Waiting for update from Lurena Joiner with [facility] regarding insurance auth for [employer]     Barriers to Discharge: Continued Medical Work up  Expected Discharge Plan and Services   In-house Referral: Clinical Social Work,Hospice / Palliative Care   Post Acute Care Choice: Durable Medical Equipment (Oxygen 4L) Living arrangements for the past 2 months: Apartment                                       Social Determinants of Health (SDOH) Interventions    Readmission Risk Interventions No flowsheet data found.

## 2020-07-12 DIAGNOSIS — G9341 Metabolic encephalopathy: Secondary | ICD-10-CM | POA: Diagnosis not present

## 2020-07-12 DIAGNOSIS — J441 Chronic obstructive pulmonary disease with (acute) exacerbation: Secondary | ICD-10-CM | POA: Diagnosis not present

## 2020-07-12 DIAGNOSIS — J9621 Acute and chronic respiratory failure with hypoxia: Secondary | ICD-10-CM | POA: Diagnosis not present

## 2020-07-12 DIAGNOSIS — D75838 Other thrombocytosis: Secondary | ICD-10-CM

## 2020-07-12 DIAGNOSIS — J690 Pneumonitis due to inhalation of food and vomit: Secondary | ICD-10-CM | POA: Diagnosis not present

## 2020-07-12 LAB — GLUCOSE, CAPILLARY
Glucose-Capillary: 98 mg/dL (ref 70–99)
Glucose-Capillary: 121 mg/dL — ABNORMAL HIGH (ref 70–99)
Glucose-Capillary: 93 mg/dL (ref 70–99)
Glucose-Capillary: 99 mg/dL (ref 70–99)
Glucose-Capillary: 127 mg/dL — ABNORMAL HIGH (ref 70–99)
Glucose-Capillary: 135 mg/dL — ABNORMAL HIGH (ref 70–99)
Glucose-Capillary: 96 mg/dL (ref 70–99)

## 2020-07-12 LAB — CBC WITH DIFFERENTIAL/PLATELET
Abs Immature Granulocytes: 0.18 10*3/uL — ABNORMAL HIGH (ref 0.00–0.07)
Basophils Absolute: 0 10*3/uL (ref 0.0–0.1)
Basophils Relative: 0 %
Eosinophils Absolute: 0.1 10*3/uL (ref 0.0–0.5)
Eosinophils Relative: 1 %
HCT: 25.8 % — ABNORMAL LOW (ref 36.0–46.0)
Hemoglobin: 7.6 g/dL — ABNORMAL LOW (ref 12.0–15.0)
Immature Granulocytes: 2 %
Lymphocytes Relative: 16 %
Lymphs Abs: 1.6 10*3/uL (ref 0.7–4.0)
MCH: 28.9 pg (ref 26.0–34.0)
MCHC: 29.5 g/dL — ABNORMAL LOW (ref 30.0–36.0)
MCV: 98.1 fL (ref 80.0–100.0)
Monocytes Absolute: 0.9 10*3/uL (ref 0.1–1.0)
Monocytes Relative: 9 %
Neutro Abs: 7.2 10*3/uL (ref 1.7–7.7)
Neutrophils Relative %: 72 %
Platelets: 416 10*3/uL — ABNORMAL HIGH (ref 150–400)
RBC: 2.63 MIL/uL — ABNORMAL LOW (ref 3.87–5.11)
RDW: 15.5 % (ref 11.5–15.5)
WBC: 10.1 10*3/uL (ref 4.0–10.5)
nRBC: 0.5 % — ABNORMAL HIGH (ref 0.0–0.2)

## 2020-07-12 LAB — BASIC METABOLIC PANEL
Anion gap: 7 (ref 5–15)
BUN: 16 mg/dL (ref 8–23)
CO2: 36 mmol/L — ABNORMAL HIGH (ref 22–32)
Calcium: 8.9 mg/dL (ref 8.9–10.3)
Chloride: 98 mmol/L (ref 98–111)
Creatinine, Ser: 0.54 mg/dL (ref 0.44–1.00)
GFR, Estimated: >60 mL/min (ref >60)
Glucose, Bld: 121 mg/dL — ABNORMAL HIGH (ref 70–99)
Potassium: 4.3 mmol/L (ref 3.5–5.1)
Sodium: 141 mmol/L (ref 135–145)

## 2020-07-12 LAB — MAGNESIUM: Magnesium: 1.7 mg/dL (ref 1.7–2.4)

## 2020-07-12 LAB — PHOSPHORUS: Phosphorus: 3.3 mg/dL (ref 2.5–4.6)

## 2020-07-12 NOTE — Progress Notes (Signed)
PT Cancellation Note  Patient Details Name: Virginia [name] MRN: [id number] DOB: 1951-04-16   Cancelled Treatment:    Reason Eval/Treat Not Completed: Patient declined, no reason specified (Patient refused PT services. Stated she already had therapy (saw OT previously in the afternoon). Will attempt again at later time/date as available.)  Precious Bard, PT, DPT   07/12/2020, 4:12 PM

## 2020-07-12 NOTE — Progress Notes (Signed)
PROGRESS NOTE    Virginia [name]  IOM:[id number] DOB: Feb 27, 1951 DOA: 06/06/2020 PCP: Idelle Crouch, MD   Chief complaint.  Shortness of breath. Brief Narrative:  Virginia [name] a 70 y.o.femalesevere COPD admitted with acute on chronic hypoxic hypercapnic respiratory failure and acute encephalopathy secondary to narcotic use and AECOPD requiring mechanical intubation.She was treated with steroids and empiric IV antibiotics. She could not be liberated from the ventilator so tracheostomy was performed and she has been receiving oxygen via trach collar. Now s/p trach/PEG working on trach weaning. 1/15.Patient developed worsening hypoxemia, chest x-ray showed new bilateral lower lobe infiltrates consistent with aspiration pneumonia. Zosyn startedon 1/13. Changed to Augumentin 1/15. Completed 1/18.   Assessment & Plan:   Active Problems:   AF (paroxysmal atrial fibrillation) (HCC)   Dysphagia   Respiratory failure (HCC)   COPD exacerbation (HCC)   Acute urinary retention   Acute metabolic encephalopathy   Acute on chronic respiratory failure with hypoxia and hypercapnia (HCC)   Aspiration pneumonia of both lower lobes due to gastric secretions ([place])  #1.  Acute on chronic respiratory failure with hypoxemia and hypercapnia.  COPD exacerbation. Aspiration pneumonia Condition had improved, antibiotics completed.  Patient has some diarrhea yesterday, resolved today.  He had increased oxygen requirement last night after fluids, improved this morning.  I will hold off diuretics.  #2.  Dysphagia. Deconditioning. Moderate protein calorie malnutrition. Pending LTAC versus nursing home.  3.  Diarrhea.  Condition improved after given Imodium.  No leukocytosis,  #4.  Acute metabolic encephalopathy. Condition improved.  5.  Anemia of chronic disease. Reactive thrombocytosis. Patient was given IV iron, she also received a B12 injection.  Homocystine levels normal with  borderline B12 level.  #6.  Paroxysmal atrial fibrillation.  Continue Eliquis.    DVT prophylaxis: Eliquis Code Status: Full Family Communication:  Disposition Plan:  .   Status is: Inpatient  Remains inpatient appropriate because:Inpatient level of care appropriate due to severity of illness   Dispo: The patient is from: Home              Anticipated d/c is to: SNF              Anticipated d/c date is: 2 days              Patient currently is not medically stable to d/c.        I/O last 3 completed shifts: In: 36 [Other:120; NG/GT:594] Out: -  Total I/O In: -  Out: 300 [Urine:300]     Consultants:   None  Procedures: PEG, Trach  Antimicrobials: None  Subjective: Patient had increased oxygen requirement last night with some short of breath.  Condition improved after giving breathing treatment.  Currently on 5.5 L oxygen.  No signal short of breath, she has a strong cough, not productive. No fever or chills. She has no longer has diarrhea, no nausea vomiting.  No abdominal pain. No dysuria hematuria.  Objective: Vitals:   07/12/20 0500 07/12/20 0716 07/12/20 0805 07/12/20 1205  BP: 134/85  (!) 150/94 (!) 144/92  Pulse: (!) 105  (!) 108 94  Resp: _0 Temp: 97.7 F (36.5 C)  98.4 F (36.9 C) 98.6 F (37 C)  TempSrc:      SpO2: (!) 89% 92% 97% 92%  Weight:      Height:        Intake/Output Summary (Last 24 hours) at 07/12/2020 1327 Last data filed at 07/12/2020 1100  Gross per 24 hour  Intake -  Output 300 ml  Net -300 ml   Filed Weights   07/07/20 0104 07/10/20 0500 07/12/20 0347  Weight: 53.5 kg 52 kg 54.2 kg    Examination:  General exam: Appears calm and comfortable  Respiratory system: Clear to auscultation. Respiratory effort normal. Cardiovascular system: S1 & S2 heard, RRR. No JVD, murmurs, rubs, gallops or clicks. No pedal edema. Gastrointestinal system: Abdomen is nondistended, soft and nontender. No organomegaly or masses  felt. Normal bowel sounds heard. Central nervous system: Alert and oriented. No focal neurological deficits. Extremities: Symmetric  Skin: No rashes, lesions or ulcers Psychiatry:Mood & affect appropriate.     Data Reviewed: I have personally reviewed following labs and imaging studies  CBC: Recent Labs  Lab 07/07/20 0855 07/08/20 0616 07/10/20 0609 07/12/20 0535  WBC 7.1 9.7 9.7 10.1  NEUTROABS 5.1 6.7 6.6 7.2  HGB 7.9* 8.2* 7.6* 7.6*  HCT 25.6* 26.3* 26.1* 25.8*  MCV 93.1 94.9 96.7 98.1  PLT 389 416* 403* 409*   Basic Metabolic Panel: Recent Labs  Lab 07/06/20 0142 07/07/20 0855 07/10/20 0609 07/12/20 0535  NA  --  140 140 141  K  --  3.7 4.4 4.3  CL  --  94* 97* 98  CO2  --  37* 36* 36*  GLUCOSE  --  104* 90 121*  BUN  --  _0 CREATININE  --  0.88 0.58 0.54  CALCIUM  --  8.8* 8.9 8.9  MG 1.9 1.9 1.9 1.7  PHOS 4.5 4.3 3.8 3.3   GFR: Estimated Creatinine Clearance: 52.5 mL/min (by C-G formula based on SCr of 0.54 mg/dL). Liver Function Tests: No results for input(s): AST, ALT, ALKPHOS, BILITOT, PROT, ALBUMIN in the last 168 hours. No results for input(s): LIPASE, AMYLASE in the last 168 hours. No results for input(s): AMMONIA in the last 168 hours. Coagulation Profile: No results for input(s): INR, PROTIME in the last 168 hours. Cardiac Enzymes: No results for input(s): CKTOTAL, CKMB, CKMBINDEX, TROPONINI in the last 168 hours. BNP (last 3 results) No results for input(s): PROBNP in the last 8760 hours. HbA1C: No results for input(s): HGBA1C in the last 72 hours. CBG: Recent Labs  Lab 07/11/20 2054 07/11/20 2343 07/12/20 0504 07/12/20 0807 07/12/20 1205  GLUCAP 113* 93 98 121* 127*   Lipid Profile: No results for input(s): CHOL, HDL, LDLCALC, TRIG, CHOLHDL, LDLDIRECT in the last 72 hours. Thyroid Function Tests: No results for input(s): TSH, T4TOTAL, FREET4, T3FREE, THYROIDAB in the last 72 hours. Anemia Panel: No results for input(s):  VITAMINB12, FOLATE, FERRITIN, TIBC, IRON, RETICCTPCT in the last 72 hours. Sepsis Labs: Recent Labs  Lab 07/07/20 0855  PROCALCITON <0.10    No results found for this or any previous visit (from the past 240 hour(s)).       Radiology Studies: No results found.      Scheduled Meds: . sodium chloride   Intravenous Once  . apixaban  5 mg Per Tube BID  . arformoterol  15 mcg Nebulization BID  . chlorhexidine gluconate (MEDLINE KIT)  15 mL Mouth Rinse BID  . Chlorhexidine Gluconate Cloth  6 each Topical Daily  . clonazePAM  1 mg Per Tube BID  . cyanocobalamin  1,000 mcg Intramuscular Q1500  . feeding supplement (OSMOLITE 1.2 CAL)  237 mL Per Tube 6 X Daily  . feeding supplement (PROSource TF)  45 mL Per Tube Daily  . fentaNYL  1 patch Transdermal Q72H  .  free water  60 mL Per Tube 6 X Daily  . gabapentin  300 mg Per Tube TID  . glycopyrrolate  1 mg Per Tube BID  . insulin aspart  0-6 Units Subcutaneous Q4H  . metoprolol tartrate  50 mg Per Tube BID  . pantoprazole (PROTONIX) IV  40 mg Intravenous QHS  . QUEtiapine  25 mg Per Tube QHS  . sodium chloride flush  10-40 mL Intracatheter Q12H  . sodium chloride flush  3 mL Intravenous Q12H   Continuous Infusions: . sodium chloride Stopped (06/25/20 1842)  . sodium chloride    . iron sucrose       LOS: 36 days    Time spent: 27 minutes    Sharen Hones, MD [facility]   To contact the attending provider between 7A-7P or the covering provider during after hours 7P-7A, please log into the web site [email or web address] and access using universal [place] password for that web site. If you do not have the password, please call the hospital operator.  07/12/2020, 1:27 PM

## 2020-07-12 NOTE — Plan of Care (Signed)
Pt alert and oriented, on trach. Bolus feed given per order, BG check q4, medications crushed and given via tube. Bed alarm and falls precautions remain in place Problem: Health Behavior/Discharge Planning: Goal: Ability to manage health-related needs will improve Outcome: Progressing   Problem: Clinical Measurements: Goal: Will remain free from infection Outcome: Progressing   Problem: Elimination: Goal: Will not experience complications related to bowel motility Outcome: Progressing Goal: Will not experience complications related to urinary retention Outcome: Progressing   Problem: Safety: Goal: Ability to remain free from injury will improve Outcome: Progressing

## 2020-07-12 NOTE — Progress Notes (Signed)
Redness observed on pt's gluteals, nurse notified. Mepelix dressing applied.

## 2020-07-12 NOTE — Progress Notes (Signed)
Occupational Therapy Treatment Patient Details Name: ALOMA BOCH MRN: [id number] DOB: August 02, 1950 Today's Date: 07/12/2020    History of present illness 70 yo female with severe COPD admitted with acute on chronic hypoxic hypercapnic respiratory failure and acute encephalopathy secondary to narcotic use and AECOPD requiring mechanical intubation. Initially intubated from 12/14-12/16 and then again from 12/18-12/23. Now with + trach collar. Recent hospitalization from 12/10-12/12/21.   OT comments  Upon entering the room, pt supine in bed and reports not feeling well and that she did not sleep last night. OT encouraged pt for participation and she was agreeable. Pt on 5L this session and able to perform supine >sit with min guard to EOB. Pt reports feeling SOB but able to maintain O2 in 90's for 15 minutes while seated with supervision for balance while combing and putting up hair. Pt then stands with min A and noted posterior bias requiring min - mod HHA for several side steps to the L up bed. Pt reports feeling very SOB and taking very labored short breaths with pt's O2 saturation being 86% and pt reports being very anxious. OT turning O2 up to 7L for pt to recover into 90s after ~ 2 minutes. OT returned O2 back to 5Ls and returning to supine with min guard. All needs within reach. Pt continues to benefit from OT intervention.    Follow Up Recommendations  SNF    Equipment Recommendations  Other (comment) (defer to next venue of care)       Precautions / Restrictions Precautions Precautions: Fall Precaution Comments: trach Restrictions Other Position/Activity Restrictions: watch sats       Mobility Bed Mobility Overal bed mobility: Needs Assistance Bed Mobility: Rolling;Supine to Sit;Sit to Supine Rolling: Supervision   Supine to sit: Min guard;HOB elevated Sit to supine: Min guard;HOB elevated   General bed mobility comments: use of bed rails and min  cuing  Transfers Overall transfer level: Needs assistance Equipment used: Rolling walker (2 wheeled) Transfers: Sit to/from Stand Sit to Stand: Min assist         General transfer comment: Cuing for proper hand placement to push up from EOB    Balance Overall balance assessment: Needs assistance Sitting-balance support: Single extremity supported;Feet supported Sitting balance-Leahy Scale: Fair Sitting balance - Comments: close supervision static sitting EOB   Standing balance support: Bilateral upper extremity supported Standing balance-Leahy Scale: Poor Standing balance comment: reliance on BUE support on RW                           ADL either performed or assessed with clinical judgement        Vision Patient Visual Report: No change from baseline            Cognition Arousal/Alertness: Awake/alert Behavior During Therapy: WFL for tasks assessed/performed Overall Cognitive Status: Within Functional Limits for tasks assessed                                                     Pertinent Vitals/ Pain       Pain Assessment: Faces Faces Pain Scale: No hurt  Home Living Family/patient expects to be discharged to:: Private residence Living Arrangements: Alone  Frequency  Min 1X/week        Progress Toward Goals  OT Goals(current goals can now be found in the care plan section)  Progress towards OT goals: Progressing toward goals  Acute Rehab OT Goals Patient Stated Goal: To return home safely OT Goal Formulation: With patient Time For Goal Achievement: 07/20/20 Potential to Achieve Goals: Good  Plan Discharge plan remains appropriate       AM-PAC OT "6 Clicks" Daily Activity     Outcome Measure   Help from another person eating meals?: A Little Help from another person taking care of personal grooming?: A Little Help from another person toileting, which  includes using toliet, bedpan, or urinal?: A Little Help from another person bathing (including washing, rinsing, drying)?: A Little Help from another person to put on and taking off regular upper body clothing?: A Little Help from another person to put on and taking off regular lower body clothing?: A Little 6 Click Score: 18    End of Session Equipment Utilized During Treatment: Oxygen;Other (comment) (5 L trach)  OT Visit Diagnosis: Other abnormalities of gait and mobility (R26.89);Muscle weakness (generalized) (M62.81)   Activity Tolerance Patient tolerated treatment well   Patient Left in bed;with call bell/phone within reach;with nursing/sitter in room   Nurse Communication Mobility status        Time: 8016-5537 OT Time Calculation (min): 28 min  Charges: OT General Charges $OT Visit: 1 Visit OT Treatments $Self Care/Home Management : 8-22 mins $Therapeutic Activity: 8-22 mins  Jackquline Denmark, MS, OTR/L , CBIS ascom [phone]  07/12/20, 4:27 PM

## 2020-07-12 NOTE — TOC Progression Note (Addendum)
Transition of Care [facility]) - Progression Note    Patient Details  Name: Virginia [name] MRN: [id number] Date of Birth: 02-02-1951  Transition of Care [facility]) CM/SW Contact  Liliana Cline, LCSW Phone Number: 07/12/2020, 9:07 AM  Clinical Narrative:   Reached out to Lurena Joiner with [facility] requesting an update. Per Lurena Joiner, insurance auth denied and they are going to re start and see if they can get auth now since patient has pneumonia. TOC can follow up on possible SNF over weekend once patient has had trach for 30 days.  2:05- Call from [place] with [facility] who reported patient no longer qualifies for LTAC.   4:12- Reached out to [facility] and [facility] to inquire if they can take new trach patients after 30 days. Waiting to hear back.    Barriers to Discharge: Continued Medical Work up  Expected Discharge Plan and Services   In-house Referral: Clinical Social Work,Hospice / Palliative Care   Post Acute Care Choice: Durable Medical Equipment (Oxygen 4L) Living arrangements for the past 2 months: Apartment                                       Social Determinants of Health (SDOH) Interventions    Readmission Risk Interventions No flowsheet data found.

## 2020-07-12 NOTE — Progress Notes (Signed)
Nutrition Follow-up  DOCUMENTATION CODES:   Not applicable  INTERVENTION:  Continue Osmolite 1.2 Cal 1 carton (237 mL) 6 times per day per G-tube + PROSource TF 45 mL once daily per tube. Provides 1750 kcal, 90 grams of protein, 1170 mL H2O daily.  Flush tube with 30 mL before and after each bolus feeding. Provides a total of 1530 mL H2O daily including water in tube feed regimen.  NUTRITION DIAGNOSIS:   Increased nutrient needs related to catabolic illness (COPD) as evidenced by estimated needs.  Ongoing.  GOAL:   Patient will meet greater than or equal to 90% of their needs  Met with TF regimen.  MONITOR:   PO intake,Diet advancement,TF tolerance,Labs,Weight trends  REASON FOR ASSESSMENT:   Ventilator,Consult Assessment of nutrition requirement/status,Enteral/tube feeding initiation and management  ASSESSMENT:   70 year old female with PMHx of COPD, hx tracheostomy tube placement and PEG tube placement in 2017, HTN, depression, RLS, osteoporosis, hepatitis C, A-fib, anxiety, arthritis admitted with acute on chronic hypoxic hypercapnic respiratory failure and acute encephalopathy secondary to narcotic use and acute exacerbation of COPD.  12/14 intubated 12/16 extubated 12/18 reintubated and TFs restarted 12/23 s/p tracheostomy tube placement 12/23 Dobbhoff tube was placed after patient returned from trach and TFs were restarted 12/25 pt pulled out Dobbhoff tube and refused replacement 12/29 s/p PEG tube placement 1/5 pt changed to bolus regimen with Jevity 1.2 Cal  1/11 pt changed to bolus regimen with Osmolite 1.2 Cal  Met with patient at bedside. She has now been accepting her tube feeds. She reports she feels full after a feeding but is tolerating them. Denies any N/V.  Enteral Access: 20 Fr. Endovive Safety G-tube  Medications reviewed and include: fentanyl patch, Novolog 0-6 units Q4hrs, Protonix.  Labs reviewed: CBG 93-127, CO2 36.  Weight trend: 54.2 kg  on 1/19; -4.9 kg from 12/16  Student RNs at bedside preparing to help patient use bedpan at time of RD assessment.  Diet Order:   Diet Order            Diet NPO time specified  Diet effective now                EDUCATION NEEDS:   No education needs have been identified at this time  Skin:  Skin Assessment: Reviewed RN Assessment (MASD to groin and perineum) Skin Integrity Issues:: Incisions Incisions: closed incision to neck  Last BM:  07/12/2020 - small type 7  Height:   Ht Readings from Last 1 Encounters:  06/06/20 5' 2.01" (1.575 m)   Weight:   Wt Readings from Last 1 Encounters:  07/12/20 54.2 kg   Ideal Body Weight:  50 kg  BMI:  Body mass index is 21.85 kg/m.  Estimated Nutritional Needs:   Kcal:  1500-1750  Protein:  85-95 grams  Fluid:  1.5-1.7 L/day  Jacklynn Barnacle, MS, RD, LDN Pager number available on Amion

## 2020-07-13 ENCOUNTER — Inpatient Hospital Stay: Payer: Medicare Other

## 2020-07-13 ENCOUNTER — Encounter: Payer: Self-pay | Admitting: Student

## 2020-07-13 DIAGNOSIS — J9622 Acute and chronic respiratory failure with hypercapnia: Secondary | ICD-10-CM

## 2020-07-13 DIAGNOSIS — Z7901 Long term (current) use of anticoagulants: Secondary | ICD-10-CM | POA: Diagnosis not present

## 2020-07-13 DIAGNOSIS — I480 Paroxysmal atrial fibrillation: Secondary | ICD-10-CM | POA: Diagnosis not present

## 2020-07-13 DIAGNOSIS — J441 Chronic obstructive pulmonary disease with (acute) exacerbation: Secondary | ICD-10-CM | POA: Diagnosis not present

## 2020-07-13 DIAGNOSIS — J9621 Acute and chronic respiratory failure with hypoxia: Secondary | ICD-10-CM

## 2020-07-13 DIAGNOSIS — G9341 Metabolic encephalopathy: Secondary | ICD-10-CM | POA: Diagnosis not present

## 2020-07-13 DIAGNOSIS — J690 Pneumonitis due to inhalation of food and vomit: Secondary | ICD-10-CM | POA: Diagnosis not present

## 2020-07-13 DIAGNOSIS — J9601 Acute respiratory failure with hypoxia: Secondary | ICD-10-CM | POA: Diagnosis not present

## 2020-07-13 LAB — BLOOD GAS, ARTERIAL
Acid-Base Excess: 13.4 mmol/L — ABNORMAL HIGH (ref 0.0–2.0)
Bicarbonate: 37.6 mmol/L — ABNORMAL HIGH (ref 20.0–28.0)
FIO2: 0.98
FIO2: 0.35
MECHVT: 400 mL
Mechanical Rate: 24
O2 Saturation: 98.9 %
PEEP: 5 cmH2O
Patient temperature: 37
Patient temperature: 37
RATE: 24 resp/min
pCO2 arterial: <19 mmHg — CL (ref 32.0–48.0)
pCO2 arterial: 46 mmHg (ref 32.0–48.0)
pH, Arterial: 7 — CL (ref 7.350–7.450)
pH, Arterial: 7.52 — ABNORMAL HIGH (ref 7.350–7.450)
pO2, Arterial: 38 mmHg — CL (ref 83.0–108.0)
pO2, Arterial: 114 mmHg — ABNORMAL HIGH (ref 83.0–108.0)

## 2020-07-13 LAB — CBC WITH DIFFERENTIAL/PLATELET
Abs Immature Granulocytes: 0.21 10*3/uL — ABNORMAL HIGH (ref 0.00–0.07)
Basophils Absolute: 0.1 10*3/uL (ref 0.0–0.1)
Basophils Relative: 0 %
Eosinophils Absolute: 0.1 10*3/uL (ref 0.0–0.5)
Eosinophils Relative: 1 %
HCT: 27.4 % — ABNORMAL LOW (ref 36.0–46.0)
Hemoglobin: 7.9 g/dL — ABNORMAL LOW (ref 12.0–15.0)
Immature Granulocytes: 2 %
Lymphocytes Relative: 5 %
Lymphs Abs: 0.7 10*3/uL (ref 0.7–4.0)
MCH: 28.6 pg (ref 26.0–34.0)
MCHC: 28.8 g/dL — ABNORMAL LOW (ref 30.0–36.0)
MCV: 99.3 fL (ref 80.0–100.0)
Monocytes Absolute: 0.6 10*3/uL (ref 0.1–1.0)
Monocytes Relative: 5 %
Neutro Abs: 11.4 10*3/uL — ABNORMAL HIGH (ref 1.7–7.7)
Neutrophils Relative %: 87 %
Platelets: 427 10*3/uL — ABNORMAL HIGH (ref 150–400)
RBC: 2.76 MIL/uL — ABNORMAL LOW (ref 3.87–5.11)
RDW: 15.9 % — ABNORMAL HIGH (ref 11.5–15.5)
WBC: 13.1 10*3/uL — ABNORMAL HIGH (ref 4.0–10.5)
nRBC: 0.3 % — ABNORMAL HIGH (ref 0.0–0.2)

## 2020-07-13 LAB — BRAIN NATRIURETIC PEPTIDE: B Natriuretic Peptide: 145.7 pg/mL — ABNORMAL HIGH (ref 0.0–100.0)

## 2020-07-13 LAB — BASIC METABOLIC PANEL
Anion gap: 12 (ref 5–15)
BUN: 23 mg/dL (ref 8–23)
CO2: 33 mmol/L — ABNORMAL HIGH (ref 22–32)
Calcium: 9 mg/dL (ref 8.9–10.3)
Chloride: 91 mmol/L — ABNORMAL LOW (ref 98–111)
Creatinine, Ser: 0.9 mg/dL (ref 0.44–1.00)
GFR, Estimated: >60 mL/min (ref >60)
Glucose, Bld: 85 mg/dL (ref 70–99)
Potassium: 5.3 mmol/L — ABNORMAL HIGH (ref 3.5–5.1)
Sodium: 136 mmol/L (ref 135–145)

## 2020-07-13 LAB — COMPREHENSIVE METABOLIC PANEL
ALT: 23 U/L (ref 0–44)
AST: 31 U/L (ref 15–41)
Albumin: 2.9 g/dL — ABNORMAL LOW (ref 3.5–5.0)
Alkaline Phosphatase: 19 U/L — ABNORMAL LOW (ref 38–126)
Anion gap: 8 (ref 5–15)
BUN: 19 mg/dL (ref 8–23)
CO2: 37 mmol/L — ABNORMAL HIGH (ref 22–32)
Calcium: 9.1 mg/dL (ref 8.9–10.3)
Chloride: 93 mmol/L — ABNORMAL LOW (ref 98–111)
Creatinine, Ser: 0.79 mg/dL (ref 0.44–1.00)
GFR, Estimated: >60 mL/min (ref >60)
Glucose, Bld: 256 mg/dL — ABNORMAL HIGH (ref 70–99)
Potassium: 5.3 mmol/L — ABNORMAL HIGH (ref 3.5–5.1)
Sodium: 138 mmol/L (ref 135–145)
Total Bilirubin: 0.3 mg/dL (ref 0.3–1.2)
Total Protein: 6.5 g/dL (ref 6.5–8.1)

## 2020-07-13 LAB — MAGNESIUM: Magnesium: 1.8 mg/dL (ref 1.7–2.4)

## 2020-07-13 LAB — GLUCOSE, CAPILLARY
Glucose-Capillary: 102 mg/dL — ABNORMAL HIGH (ref 70–99)
Glucose-Capillary: 118 mg/dL — ABNORMAL HIGH (ref 70–99)
Glucose-Capillary: 170 mg/dL — ABNORMAL HIGH (ref 70–99)
Glucose-Capillary: 196 mg/dL — ABNORMAL HIGH (ref 70–99)
Glucose-Capillary: 67 mg/dL — ABNORMAL LOW (ref 70–99)
Glucose-Capillary: 67 mg/dL — ABNORMAL LOW (ref 70–99)
Glucose-Capillary: 71 mg/dL (ref 70–99)
Glucose-Capillary: 85 mg/dL (ref 70–99)
Glucose-Capillary: 87 mg/dL (ref 70–99)
Glucose-Capillary: 95 mg/dL (ref 70–99)

## 2020-07-13 LAB — PROTIME-INR
INR: 1.3 — ABNORMAL HIGH (ref 0.8–1.2)
Prothrombin Time: 15.3 seconds — ABNORMAL HIGH (ref 11.4–15.2)

## 2020-07-13 LAB — LACTIC ACID, PLASMA
Lactic Acid, Venous: 1.8 mmol/L (ref 0.5–1.9)
Lactic Acid, Venous: 2.2 mmol/L (ref 0.5–1.9)
Lactic Acid, Venous: 1.4 mmol/L (ref 0.5–1.9)

## 2020-07-13 LAB — TROPONIN I (HIGH SENSITIVITY)
Troponin I (High Sensitivity): 18 ng/L — ABNORMAL HIGH (ref <18)
Troponin I (High Sensitivity): 20 ng/L — ABNORMAL HIGH (ref <18)
Troponin I (High Sensitivity): 18 ng/L — ABNORMAL HIGH (ref <18)

## 2020-07-13 LAB — PROCALCITONIN: Procalcitonin: 0.13 ng/mL

## 2020-07-13 LAB — FIBRIN DERIVATIVES D-DIMER (ARMC ONLY): Fibrin derivatives D-dimer (ARMC): 583.89 ng/mL (FEU) — ABNORMAL HIGH (ref 0.00–499.00)

## 2020-07-13 MED ORDER — CHLORHEXIDINE GLUCONATE 0.12% ORAL RINSE (MEDLINE KIT)
15.0000 mL | Freq: Two times a day (BID) | OROMUCOSAL | Status: DC
  Administered 2020-07-14 – 2020-07-26 (×23): 15 mL via OROMUCOSAL

## 2020-07-13 MED ORDER — DEXTROSE 50 % IV SOLN
INTRAVENOUS | Status: AC
  Filled 2020-07-13: qty 50

## 2020-07-13 MED ORDER — APIXABAN 5 MG PO TABS
5.0000 mg | ORAL_TABLET | Freq: Two times a day (BID) | ORAL | Status: DC
  Administered 2020-07-13: 5 mg via ORAL
  Filled 2020-07-13: qty 1

## 2020-07-13 MED ORDER — IOHEXOL 350 MG/ML SOLN: 75.0000 mL | Freq: Once | INTRAVENOUS | Status: DC | PRN

## 2020-07-13 MED ORDER — ORAL CARE MOUTH RINSE
15.0000 mL | OROMUCOSAL | Status: DC
  Administered 2020-07-13 – 2020-07-27 (×99): 15 mL via OROMUCOSAL

## 2020-07-13 MED ORDER — DEXTROSE 50 % IV SOLN
12.5000 g | INTRAVENOUS | Status: AC
  Administered 2020-07-13: 12.5 g via INTRAVENOUS

## 2020-07-13 MED ORDER — LACTATED RINGERS IV BOLUS
250.0000 mL | Freq: Once | INTRAVENOUS | Status: AC
  Administered 2020-07-13: 250 mL via INTRAVENOUS

## 2020-07-13 MED ORDER — PROSOURCE TF PO LIQD
45.0000 mL | Freq: Every day | ORAL | Status: DC
  Administered 2020-07-14 – 2020-07-17 (×4): 45 mL
  Filled 2020-07-13 (×5): qty 45

## 2020-07-13 MED ORDER — APIXABAN 5 MG PO TABS
5.0000 mg | ORAL_TABLET | Freq: Two times a day (BID) | ORAL | Status: DC
  Administered 2020-07-14 – 2020-07-27 (×28): 5 mg
  Filled 2020-07-13 (×29): qty 1

## 2020-07-13 MED ORDER — OSMOLITE 1.2 CAL PO LIQD
237.0000 mL | Freq: Every day | ORAL | Status: DC
  Administered 2020-07-13 – 2020-07-16 (×6): 237 mL

## 2020-07-13 NOTE — Progress Notes (Signed)
Pg CD Stroke. At arrival patient was being attended to by medical staff. Patient's daughter was informed of her medical situation. Patient taken to CT. Asked the nurse to page if family needed support upon arrival.

## 2020-07-13 NOTE — TOC Progression Note (Signed)
Transition of Care [facility]) - Progression Note    Patient Details  Name: Virginia [name] MRN: [id number] Date of Birth: 1950/07/19  Transition of [facility]) CM/SW Contact  Liliana Cline, LCSW Phone Number: 07/13/2020, 10:37 AM  Clinical Narrative:   CSW updated by [facility] unable to take patient with trach due to staffing issues. Patient now moving back to ICU.       Barriers to Discharge: Continued Medical Work up  Expected Discharge Plan and Services   In-house Referral: Clinical Social Work,Hospice / Palliative Care   Post Acute Care Choice: Durable Medical Equipment (Oxygen 4L) Living arrangements for the past 2 months: Apartment                                       Social Determinants of Health (SDOH) Interventions    Readmission Risk Interventions No flowsheet data found.

## 2020-07-13 NOTE — Consult Note (Addendum)
CRITICAL CARE  CONSULT NOTE  Virginia [name] a 70 y.o.femalesevere COPD admitted with acute on chronic hypoxic hypercapnic respiratory failure and acute encephalopathy secondary to narcotic use and AECOPD requiring mechanical intubation.She was treated with steroids and empiric IV antibiotics. She could not be liberated from the ventilator so tracheostomy was performed and she has been receiving oxygen via trach collar. Now s/p trach/PEG working on trach weaning. 1/15.Patient developed worsening hypoxemia, chest x-ray showed new bilateral lower lobe infiltrates consistent with aspiration pneumonia. Zosyn startedon 1/13. Changed to Augumentin 1/15.Completed 1/18. This morning she developed sudden onset of on altered mental status and speech and was unresponsive.  ABG done showed severe metabolic acidosis and she was transferred to the ICU.  In the ICU she was placed on a ventilator and her mental status improved rapidly to the extent that she was awake and alert and following commands and did not complain anything. In the ICU when I saw her she denied any chest pain shortness of breath or cough.  She denied any significant sputum.  No fever chills or rigors were reported.  No nausea vomiting or abdominal pain reported.  No headaches.  She was awake and alert and followed all commands.  She denied any rashes or joint pain.  PAST MED HX COPD Chronic resp failure Anxiety Chronic Tracheostomy Dysphagia s/p PEG Paroxysmal A fib Reactive thrombocytosis HTN    BP (!) 68/59    Pulse 91    Temp 97.6 F (36.4 C) (Axillary)    Resp (!) 24    Ht '5\' 2"'  (1.575 m)    Wt 56.3 kg    SpO2 99%    BMI 22.70 kg/m    REVIEW OF SYSTEMS As above   PATIENT IS UNABLE TO PROVIDE COMPLETE REVIEW OF SYSTEMS DUE TO SEVERE CRITICAL ILLNESS   PHYSICAL EXAMINATION:  GENERAL:critically ill appearing, +resp distress HEAD: Normocephalic, atraumatic.  EYES: Pupils equal, round, reactive to light.  No  scleral icterus.  MOUTH: Moist mucosal membrane. NECK: Supple. No thyromegaly. No nodules. No JVD.  PULMONARY: +rhonchi, +wheezing CARDIOVASCULAR: S1 and S2. Regular rate and rhythm. No murmurs, rubs, or gallops.  GASTROINTESTINAL: Soft, nontender, -distended. No masses. Positive bowel sounds. No hepatosplenomegaly.  MUSCULOSKELETAL: No swelling, clubbing, or edema.  NEUROLOGIC: obtunded, GCS<8 SKIN:intact,warm,dry  INTAKE/OUTPUT  Intake/Output Summary (Last 24 hours) at 07/13/2020 1310 Last data filed at 07/13/2020 7353 Gross per 24 hour  Intake --  Output 520 ml  Net -520 ml    LABS  CBC Recent Labs  Lab 07/10/20 0609 07/12/20 0535 07/13/20 1104  WBC 9.7 10.1 13.1*  HGB 7.6* 7.6* 7.9*  HCT 26.1* 25.8* 27.4*  PLT 403* 416* 427*   Coag's Recent Labs  Lab 07/13/20 1104  INR 1.3*   BMET Recent Labs  Lab 07/10/20 0609 07/12/20 0535 07/13/20 1104  NA 140 141 138  K 4.4 4.3 5.3*  CL 97* 98 93*  CO2 36* 36* 37*  BUN '16 16 19  ' CREATININE 0.58 0.54 0.79  GLUCOSE 90 121* 256*   Electrolytes Recent Labs  Lab 07/07/20 0855 07/10/20 0609 07/12/20 0535 07/13/20 1104  CALCIUM 8.8* 8.9 8.9 9.1  MG 1.9 1.9 1.7 1.8  PHOS 4.3 3.8 3.3  --    Sepsis Markers Recent Labs  Lab 07/07/20 0855 07/13/20 1104  LATICACIDVEN  --  1.8  PROCALCITON <0.10  --    ABG Recent Labs  Lab 07/13/20 0929  PHART 7.00*  PCO2ART <19.0*  PO2ART 38*   Liver  Enzymes Recent Labs  Lab 07/13/20 1104  AST 31  ALT 23  ALKPHOS 19*  BILITOT 0.3  ALBUMIN 2.9*   Cardiac Enzymes No results for input(s): TROPONINI, PROBNP in the last 168 hours. Glucose Recent Labs  Lab 07/12/20 2114 07/13/20 0025 07/13/20 0422 07/13/20 0754 07/13/20 0936 07/13/20 1028  GLUCAP 96 102* 95 118* 170* 196*     No results found for this or any previous visit (from the past 240 hour(s)).  MEDICATIONS   Current Facility-Administered Medications:    0.9 %  sodium chloride infusion (Manually  program via Guardrails IV Fluids), , Intravenous, Once, Rust-Chester, Huel Cote, NP, Held at 06/18/20 0812   0.9 %  sodium chloride infusion, 250 mL, Intravenous, Continuous, Duffy Bruce, MD, Stopped at 06/25/20 1842   0.9 %  sodium chloride infusion, 250 mL, Intravenous, PRN, Flora Lipps, MD   acetaminophen (TYLENOL) 160 MG/5ML solution 650 mg, 650 mg, Per Tube, Q4H PRN, Flora Lipps, MD, 650 mg at 07/13/20 0904   arformoterol (BROVANA) nebulizer solution 15 mcg, 15 mcg, Nebulization, BID, Candee Furbish, MD, 15 mcg at 07/13/20 0739   chlorhexidine gluconate (MEDLINE KIT) (PERIDEX) 0.12 % solution 15 mL, 15 mL, Mouth Rinse, BID, Mortimer Fries, Maretta Bees, MD, 15 mL at 07/13/20 2671   chlorhexidine gluconate (MEDLINE KIT) (PERIDEX) 0.12 % solution 15 mL, 15 mL, Mouth Rinse, BID, Sharen Hones, MD   Chlorhexidine Gluconate Cloth 2 % PADS 6 each, 6 each, Topical, Daily, Flora Lipps, MD, 6 each at 07/13/20 0905   clonazePAM (KLONOPIN) tablet 1 mg, 1 mg, Per Tube, BID, Candee Furbish, MD, 1 mg at 07/13/20 0830   diazepam (VALIUM) tablet 5 mg, 5 mg, Per Tube, Q6H PRN, Awilda Bill, NP, 5 mg at 07/13/20 0517   fentaNYL (DURAGESIC) 50 MCG/HR 1 patch, 1 patch, Transdermal, Q72H, Tyler Pita, MD, 1 patch at 07/11/20 1109   free water 60 mL, 60 mL, Per Tube, 6 X Daily, Val Riles, MD, 60 mL at 07/13/20 0831   gabapentin (NEURONTIN) capsule 300 mg, 300 mg, Per Tube, TID, Tyler Pita, MD, 300 mg at 07/13/20 0830   glycopyrrolate (ROBINUL) tablet 1 mg, 1 mg, Per Tube, BID, Ottie Glazier, MD, 1 mg at 07/13/20 0830   insulin aspart (novoLOG) injection 0-6 Units, 0-6 Units, Subcutaneous, Q4H, Dallie Piles, RPH, 1 Units at 06/24/20 1539   iohexol (OMNIPAQUE) 350 MG/ML injection 75 mL, 75 mL, Intravenous, Once PRN, Sharen Hones, MD   ipratropium-albuterol (DUONEB) 0.5-2.5 (3) MG/3ML nebulizer solution 3 mL, 3 mL, Nebulization, Q6H PRN, Awilda Bill, NP, 3 mL at 07/13/20 2458    iron sucrose (VENOFER) 300 mg in sodium chloride 0.9 % 250 mL IVPB, 300 mg, Intravenous, Once, Sharen Hones, MD   labetalol (NORMODYNE) injection 10 mg, 10 mg, Intravenous, Q4H PRN, Tyna Jaksch, MD, 10 mg at 06/28/20 0998   MEDLINE mouth rinse, 15 mL, Mouth Rinse, 10 times per day, Sharen Hones, MD   metoprolol tartrate (LOPRESSOR) injection 5 mg, 5 mg, Intravenous, Q3H PRN, Ottie Glazier, MD, 5 mg at 06/17/20 2006   metoprolol tartrate (LOPRESSOR) tablet 50 mg, 50 mg, Per Tube, BID, Candee Furbish, MD, 50 mg at 07/13/20 0830   naphazoline-glycerin (CLEAR EYES REDNESS) ophth solution 1-2 drop, 1-2 drop, Both Eyes, QID PRN, Sharen Hones, MD, 2 drop at 07/12/20 1040   ondansetron (ZOFRAN) injection 4 mg, 4 mg, Intravenous, Q6H PRN, Flora Lipps, MD, 4 mg at 07/13/20 0844   oxyCODONE (Oxy IR/ROXICODONE)  immediate release tablet 10 mg, 10 mg, Per Tube, Q4H PRN, Candee Furbish, MD, 10 mg at 07/01/20 1625   pantoprazole (PROTONIX) injection 40 mg, 40 mg, Intravenous, QHS, Dallie Piles, RPH, 40 mg at 07/12/20 2123   polyethylene glycol (MIRALAX / GLYCOLAX) packet 17 g, 17 g, Per Tube, Daily PRN, Flora Lipps, MD, 17 g at 07/13/20 0904   QUEtiapine (SEROQUEL) tablet 25 mg, 25 mg, Per Tube, QHS, Tyler Pita, MD, 25 mg at 07/12/20 2123   sodium chloride flush (NS) 0.9 % injection 10-40 mL, 10-40 mL, Intracatheter, Q12H, Tyler Pita, MD, 10 mL at 07/13/20 0840   sodium chloride flush (NS) 0.9 % injection 10-40 mL, 10-40 mL, Intracatheter, PRN, Tyler Pita, MD, 10 mL at 06/30/20 2104   sodium chloride flush (NS) 0.9 % injection 3 mL, 3 mL, Intravenous, Q12H, Kasa, Kurian, MD, 3 mL at 07/13/20 0841   sodium chloride flush (NS) 0.9 % injection 3 mL, 3 mL, Intravenous, PRN, Flora Lipps, MD   CCT head 07/13/20  CT head 07/13/20 as per rad Brain: No evidence of acute infarction, hemorrhage, hydrocephalus, extra-axial collection or mass lesion/mass effect.  Periventricular and deep white matter hypodensity.  Vascular: No hyperdense vessel or unexpected calcification.  Skull: Normal. Negative for fracture or focal lesion.  Sinuses/Orbits: Mucosal thickening and air-fluid level within the right sphenoid sinus.  Other: None.  IMPRESSION: 1. No acute intracranial pathology. Small-vessel white matter disease. 2. Mucosal thickening and air-fluid level within the right sphenoid sinus. Correlate for sinusitis.  CXR 07/13/20, images reviewed by me  FINDINGS: Tracheostomy device is present. Right PICC line tip overlies central SVC. Emphysema with stable interstitial prominence. Lung aeration is stable. No significant pleural effusion. Stable cardiomediastinal contours. Right reverse shoulder arthroplasty.  IMPRESSION: No significant change since 07/08/2020.    ASSESSMENT AND PLAN SYNOPSIS  1. Acute on chronic hypercapnic resp failure , Acute onset no obvious reason ? Mucous plug but not much suctioned from the tracheostomy.  It is possible that her CO2 may have built up over the last day or so.  CT scanning of the head does not show any significant pathology to explain her change in mental status.  We will continue full ventilator support and follow ABGs. ABG done on floor 7.00/19 /38 with hco3 of 37, cl 93 and AG 8 doesn't make physiological sense and is erroneous.  The the above ABG was entered incorrectly into the epic results. The actual ABG was pH 7.00 PCO2 greater than 150 and PO2 38. This is compatible with the overall clinical picture and is consistent with severe acute on chronic hypercapnic respiratory failure. Erroneously. The respiratory therapist is trying to figure this We will repeat ABG and BMP  2.  Acute Encephalopathy. Patient had a sudden onset of slurred speech and altered mental status started at 926am as per report from the floor and MD.Blood sugar was normal. CT head unremarkable for any acute strokes or  bleeding to explain her sudden onset of change in mental status. MS has normalized without any focal deficit.   3.COPD apperas tsbale without wheezing will cont nebs, full vent support. No need of IV steroids   4.Hypotension r/o sepsis , check blod, urine and ETS cultures, check pro calcitonin. Hold all BP meds for now  5.Anxiety cont klonopin  6. Paroxysmal AIB cont apixaban , NSR at present    DVT prophylaxis: Eliquis Code Status: Full Family Communication: d/w DIL in detail  Disposition Plan:  Critical Care Time devoted to patient care services described in this note is 60 minutes.   Overall, patient is critically ill, prognosis is guarded.  Patient with Multiorgan failure and at high risk for cardiac arrest and death.   Rosine Door, MD  2020/08/05 1:10 PM [facility]

## 2020-07-13 NOTE — Consult Note (Incomplete Revision)
CRITICAL CARE  CONSULT NOTE  Virginia [name] a 70 y.o.femalesevere COPD admitted with acute on chronic hypoxic hypercapnic respiratory failure and acute encephalopathy secondary to narcotic use and AECOPD requiring mechanical intubation.She was treated with steroids and empiric IV antibiotics. She could not be liberated from the ventilator so tracheostomy was performed and she has been receiving oxygen via trach collar. Now s/p trach/PEG working on trach weaning. 1/15.Patient developed worsening hypoxemia, chest x-ray showed new bilateral lower lobe infiltrates consistent with aspiration pneumonia. Zosyn startedon 1/13. Changed to Augumentin 1/15.Completed 1/18. This morning she developed sudden onset of on altered mental status and speech and was unresponsive.  ABG done showed severe metabolic acidosis and she was transferred to the ICU.  In the ICU she was placed on a ventilator and her mental status improved rapidly to the extent that she was awake and alert and following commands and did not complain anything. In the ICU when I saw her she denied any chest pain shortness of breath or cough.  She denied any significant sputum.  No fever chills or rigors were reported.  No nausea vomiting or abdominal pain reported.  No headaches.  She was awake and alert and followed all commands.  She denied any rashes or joint pain.  PAST MED HX COPD Chronic resp failure Anxiety Chronic Tracheostomy Dysphagia s/p PEG Paroxysmal A fib Reactive thrombocytosis HTN    BP (!) 68/59   Pulse 91   Temp 97.6 F (36.4 C) (Axillary)   Resp (!) 24   Ht '5\' 2"'  (1.575 m)   Wt 56.3 kg   SpO2 99%   BMI 22.70 kg/m    REVIEW OF SYSTEMS As above   PATIENT IS UNABLE TO PROVIDE COMPLETE REVIEW OF SYSTEMS DUE TO SEVERE CRITICAL ILLNESS   PHYSICAL EXAMINATION:  GENERAL:critically ill appearing, +resp distress HEAD: Normocephalic, atraumatic.  EYES: Pupils equal, round, reactive to light.  No  scleral icterus.  MOUTH: Moist mucosal membrane. NECK: Supple. No thyromegaly. No nodules. No JVD.  PULMONARY: +rhonchi, +wheezing CARDIOVASCULAR: S1 and S2. Regular rate and rhythm. No murmurs, rubs, or gallops.  GASTROINTESTINAL: Soft, nontender, -distended. No masses. Positive bowel sounds. No hepatosplenomegaly.  MUSCULOSKELETAL: No swelling, clubbing, or edema.  NEUROLOGIC: obtunded, GCS<8 SKIN:intact,warm,dry  INTAKE/OUTPUT  Intake/Output Summary (Last 24 hours) at 07/13/2020 1310 Last data filed at 07/13/2020 2595 Gross per 24 hour  Intake -  Output 520 ml  Net -520 ml    LABS  CBC Recent Labs  Lab 07/10/20 0609 07/12/20 0535 07/13/20 1104  WBC 9.7 10.1 13.1*  HGB 7.6* 7.6* 7.9*  HCT 26.1* 25.8* 27.4*  PLT 403* 416* 427*   Coag's Recent Labs  Lab 07/13/20 1104  INR 1.3*   BMET Recent Labs  Lab 07/10/20 0609 07/12/20 0535 07/13/20 1104  NA 140 141 138  K 4.4 4.3 5.3*  CL 97* 98 93*  CO2 36* 36* 37*  BUN '16 16 19  ' CREATININE 0.58 0.54 0.79  GLUCOSE 90 121* 256*   Electrolytes Recent Labs  Lab 07/07/20 0855 07/10/20 0609 07/12/20 0535 07/13/20 1104  CALCIUM 8.8* 8.9 8.9 9.1  MG 1.9 1.9 1.7 1.8  PHOS 4.3 3.8 3.3  --    Sepsis Markers Recent Labs  Lab 07/07/20 0855 07/13/20 1104  LATICACIDVEN  --  1.8  PROCALCITON <0.10  --    ABG Recent Labs  Lab 07/13/20 0929  PHART 7.00*  PCO2ART <19.0*  PO2ART 38*   Liver Enzymes Recent Labs  Lab 07/13/20 1104  AST 31  ALT 23  ALKPHOS 19*  BILITOT 0.3  ALBUMIN 2.9*   Cardiac Enzymes No results for input(s): TROPONINI, PROBNP in the last 168 hours. Glucose Recent Labs  Lab 07/12/20 2114 07/13/20 0025 07/13/20 0422 07/13/20 0754 07/13/20 0936 07/13/20 1028  GLUCAP 96 102* 95 118* 170* 196*     No results found for this or any previous visit (from the past 240 hour(s)).  MEDICATIONS   Current Facility-Administered Medications:  .  0.9 %  sodium chloride infusion (Manually  program via Guardrails IV Fluids), , Intravenous, Once, Rust-Chester, Huel Cote, NP, Held at 06/18/20 [phone] .  0.9 %  sodium chloride infusion, 250 mL, Intravenous, Continuous, Duffy Bruce, MD, Stopped at 06/25/20 1842 .  0.9 %  sodium chloride infusion, 250 mL, Intravenous, PRN, Flora Lipps, MD .  acetaminophen (TYLENOL) 160 MG/5ML solution 650 mg, 650 mg, Per Tube, Q4H PRN, Flora Lipps, MD, 650 mg at 07/13/20 0904 .  arformoterol (BROVANA) nebulizer solution 15 mcg, 15 mcg, Nebulization, BID, Candee Furbish, MD, 15 mcg at 07/13/20 0739 .  chlorhexidine gluconate (MEDLINE KIT) (PERIDEX) 0.12 % solution 15 mL, 15 mL, Mouth Rinse, BID, Kasa, Kurian, MD, 15 mL at 07/13/20 0904 .  chlorhexidine gluconate (MEDLINE KIT) (PERIDEX) 0.12 % solution 15 mL, 15 mL, Mouth Rinse, BID, Sharen Hones, MD .  Chlorhexidine Gluconate Cloth 2 % PADS 6 each, 6 each, Topical, Daily, Flora Lipps, MD, 6 each at 07/13/20 0905 .  clonazePAM (KLONOPIN) tablet 1 mg, 1 mg, Per Tube, BID, Candee Furbish, MD, 1 mg at 07/13/20 0830 .  diazepam (VALIUM) tablet 5 mg, 5 mg, Per Tube, Q6H PRN, Awilda Bill, NP, 5 mg at 07/13/20 0517 .  fentaNYL (DURAGESIC) 50 MCG/HR 1 patch, 1 patch, Transdermal, Q72H, Tyler Pita, MD, 1 patch at 07/11/20 1109 .  free water 60 mL, 60 mL, Per Tube, 6 X Daily, Val Riles, MD, 60 mL at 07/13/20 0831 .  gabapentin (NEURONTIN) capsule 300 mg, 300 mg, Per Tube, TID, Tyler Pita, MD, 300 mg at 07/13/20 0830 .  glycopyrrolate (ROBINUL) tablet 1 mg, 1 mg, Per Tube, BID, Lanney Gins, Fuad, MD, 1 mg at 07/13/20 0830 .  insulin aspart (novoLOG) injection 0-6 Units, 0-6 Units, Subcutaneous, Q4H, Dallie Piles, RPH, 1 Units at 06/24/20 1539 .  iohexol (OMNIPAQUE) 350 MG/ML injection 75 mL, 75 mL, Intravenous, Once PRN, Sharen Hones, MD .  ipratropium-albuterol (DUONEB) 0.5-2.5 (3) MG/3ML nebulizer solution 3 mL, 3 mL, Nebulization, Q6H PRN, Awilda Bill, NP, 3 mL at 07/13/20 0611 .   iron sucrose (VENOFER) 300 mg in sodium chloride 0.9 % 250 mL IVPB, 300 mg, Intravenous, Once, Sharen Hones, MD .  labetalol (NORMODYNE) injection 10 mg, 10 mg, Intravenous, Q4H PRN, Tyna Jaksch, MD, 10 mg at 06/28/20 3524 .  MEDLINE mouth rinse, 15 mL, Mouth Rinse, 10 times per day, Sharen Hones, MD .  metoprolol tartrate (LOPRESSOR) injection 5 mg, 5 mg, Intravenous, Q3H PRN, Ottie Glazier, MD, 5 mg at 06/17/20 2006 .  metoprolol tartrate (LOPRESSOR) tablet 50 mg, 50 mg, Per Tube, BID, Candee Furbish, MD, 50 mg at 07/13/20 0830 .  naphazoline-glycerin (CLEAR EYES REDNESS) ophth solution 1-2 drop, 1-2 drop, Both Eyes, QID PRN, Sharen Hones, MD, 2 drop at 07/12/20 1040 .  ondansetron (ZOFRAN) injection 4 mg, 4 mg, Intravenous, Q6H PRN, Flora Lipps, MD, 4 mg at 07/13/20 0844 .  oxyCODONE (Oxy IR/ROXICODONE) immediate release tablet 10 mg, 10 mg, Per  Tube, Q4H PRN, Candee Furbish, MD, 10 mg at 07/01/20 1625 .  pantoprazole (PROTONIX) injection 40 mg, 40 mg, Intravenous, QHS, Dallie Piles, RPH, 40 mg at 07/12/20 2123 .  polyethylene glycol (MIRALAX / GLYCOLAX) packet 17 g, 17 g, Per Tube, Daily PRN, Flora Lipps, MD, 17 g at 07/13/20 0904 .  QUEtiapine (SEROQUEL) tablet 25 mg, 25 mg, Per Tube, QHS, Tyler Pita, MD, 25 mg at 07/12/20 2123 .  sodium chloride flush (NS) 0.9 % injection 10-40 mL, 10-40 mL, Intracatheter, Q12H, Tyler Pita, MD, 10 mL at 07/13/20 0840 .  sodium chloride flush (NS) 0.9 % injection 10-40 mL, 10-40 mL, Intracatheter, PRN, Tyler Pita, MD, 10 mL at 06/30/20 2104 .  sodium chloride flush (NS) 0.9 % injection 3 mL, 3 mL, Intravenous, Q12H, Kasa, Kurian, MD, 3 mL at 07/13/20 0841 .  sodium chloride flush (NS) 0.9 % injection 3 mL, 3 mL, Intravenous, PRN, Flora Lipps, MD   CCT head 07/13/20  CT head 07/13/20 as per rad Brain: No evidence of acute infarction, hemorrhage, hydrocephalus, extra-axial collection or mass lesion/mass effect.  Periventricular and deep white matter hypodensity.  Vascular: No hyperdense vessel or unexpected calcification.  Skull: Normal. Negative for fracture or focal lesion.  Sinuses/Orbits: Mucosal thickening and air-fluid level within the right sphenoid sinus.  Other: None.  IMPRESSION: 1. No acute intracranial pathology. Small-vessel white matter disease. 2. Mucosal thickening and air-fluid level within the right sphenoid sinus. Correlate for sinusitis.  CXR 07/13/20, images reviewed by me  FINDINGS: Tracheostomy device is present. Right PICC line tip overlies central SVC. Emphysema with stable interstitial prominence. Lung aeration is stable. No significant pleural effusion. Stable cardiomediastinal contours. Right reverse shoulder arthroplasty.  IMPRESSION: No significant change since 07/08/2020.    ASSESSMENT AND PLAN SYNOPSIS  1. Acute on chronic hypercapnic resp failure , Acute onset no obvious reason ? Mucous plug but not much suctioned from the tracheostomy.  It is possible that her CO2 may have built up over the last day or so.  CT scanning of the head does not show any significant pathology to explain her change in mental status.  We will continue full ventilator support and follow ABGs. ABG done on floor 7.00/19 /38 with hco3 of 37, cl 93 and AG 8 doesn't make physiological sense and is erroneous.  The the above ABG was entered incorrectly into the epic results. The actual ABG was pH 7.00 PCO2 greater than 150 and PO2 38. This is compatible with the overall clinical picture and is consistent with severe acute on chronic hypercapnic respiratory failure. Erroneously. The respiratory therapist is trying to figure this We will repeat ABG and BMP  2.  Acute Encephalopathy. Patient had a sudden onset of slurred speech and altered mental status started at 926am as per report from the floor and MD.Blood sugar was normal. CT head unremarkable for any acute strokes or  bleeding to explain her sudden onset of change in mental status. MS has normalized without any focal deficit.   3.COPD apperas tsbale without wheezing will cont nebs, full vent support. No need of IV steroids   4.Hypotension r/o sepsis , check blod, urine and ETS cultures, check pro calcitonin. Hold all BP meds for now  5.Anxiety cont klonopin  6. Paroxysmal AIB cont apixaban , NSR at present    DVT prophylaxis: Eliquis Code Status: Full Family Communication: d/w DIL in detail  Disposition Plan:      Critical Care Time devoted to  patient care services described in this note is 60 minutes.   Overall, patient is critically ill, prognosis is guarded.  Patient with Multiorgan failure and at high risk for cardiac arrest and death.   Rosine Door, MD  July 29, 2020 1:10 PM [facility]

## 2020-07-13 NOTE — Consult Note (Addendum)
NEUROLOGY CONSULTATION NOTE   Date of service: July 13, 2020 Patient Name: Virginia [name] MRN:  [id number] DOB:  07-30-1950 Reason for consult: "Stroke code" _ _ _   _ __   _ __ _ _  __ __   _ __   __ _  History of Present Illness  Virginia [name] is a 70 y.o. female with PMH significant for anemia, Afibb on Eliquis with last dose this AM at 0830, hx of COPD, HTN, neuropathy, Restless leg syndrome who is admitted with acute on chronic hypoxic hypercapnic respiratory failure and acute encephalopathy secondary to narcotic use and AECOPD requiring mechanical intubation. She is s/p Trach and PEG and transferred to floor and awaiting discharge to SNF.  In the AM, patient had an acute change in her mentation. RN was in the room getting vitals at 0858 on 07/13/20 and patient was able to move all extremities and talking to her. She was found about 10-48mins later, not able to talk. Hospitalist evaluated this patient and found her to suddenly not be able to talk. A stroke code was called. On my evaluation, patient was unresponsive to noxious stimuli, not moving, not answering any questions.  Vitals with blood pressure of 150 systolic, saturating 90%, glucose of 170, no fever, mildly tachycardic at 108.  Given concerns for respiratory status, head imaging was delayed.  A stat ABG was obtained and demonstrated a pH of 7.0.  Patient was immediately transferred to the ICU and connected to the vent.  She has significant improvement in her exam.  She was noted to be awake alert, tracking my face across the room.  She is mouthing words and able to follow commands in all extremities.   ROS   Unable to obtain due to the acuity of the situation and patient on trach with no speech valve.  Past History   Past Medical History:  Diagnosis Date  . Anemia   . Anxiety   . Arthritis   . Atrial fibrillation (HCC)   . C. difficile colitis 09/2015  . COPD (chronic obstructive pulmonary disease) (HCC)   .  Depression   . Dyspnea   . Dysrhythmia   . Fibromyalgia   . H/O tracheostomy   . Hep C w/ coma, chronic   . Hypertension   . MRSA pneumonia (HCC) 2017  . On home oxygen therapy    3 L/M   . Osteoporosis   . Peripheral neuropathy   . RLS (restless legs syndrome)   . S/P percutaneous endoscopic gastrostomy (PEG) tube placement (HCC) 09/2015  . Ventilator associated pneumonia [facility]) 10/2015   [facility], [place]   Past Surgical History:  Procedure Laterality Date  . ABDOMINAL HYSTERECTOMY    . BREAST SURGERY Bilateral    Breast Implants  . DILATION AND CURETTAGE OF UTERUS    . PEG PLACEMENT N/A 06/21/2020   Procedure: PERCUTANEOUS ENDOSCOPIC GASTROSTOMY (PEG) PLACEMENT;  Surgeon: Regis Bill, MD;  Location: [facility] ENDOSCOPY;  Service: Endoscopy;  Laterality: N/A;  . REVERSE SHOULDER ARTHROPLASTY Right 03/06/2017   Procedure: REVERSE SHOULDER ARTHROPLASTY;  Surgeon: Christena Flake, MD;  Location: [facility] ORS;  Service: Orthopedics;  Laterality: Right;  . ROTATOR CUFF REPAIR Bilateral   . TRACHEOSTOMY TUBE PLACEMENT N/A 06/15/2020   Procedure: TRACHEOSTOMY;  Surgeon: Geanie Logan, MD;  Location: [facility] ORS;  Service: ENT;  Laterality: N/A;   Family History  Problem Relation Age of Onset  . Hypertension Mother    Social History   Socioeconomic  History  . Marital status: Single    Spouse name: Not on file  . Number of children: Not on file  . Years of education: Not on file  . Highest education level: Not on file  Occupational History  . Not on file  Tobacco Use  . Smoking status: Former Smoker    Packs/day: 1.50    Types: Cigarettes    Quit date: 10/05/2015    Years since quitting: 4.7  . Smokeless tobacco: Never Used  Vaping Use  . Vaping Use: Some days  . Last attempt to quit: 10/05/2015  Substance and Sexual Activity  . Alcohol use: No  . Drug use: No  . Sexual activity: Not on file  Other Topics Concern  . Not on file  Social History Narrative  . Not  on file   Social Determinants of Health   Financial Resource Strain: Not on file  Food Insecurity: Not on file  Transportation Needs: Not on file  Physical Activity: Not on file  Stress: Not on file  Social Connections: Not on file   No Known Allergies  Medications   Medications Prior to Admission  Medication Sig Dispense Refill Last Dose  . albuterol (VENTOLIN HFA) 108 (90 Base) MCG/ACT inhaler Inhale 2 puffs into the lungs every 4 (four) hours as needed for wheezing or shortness of breath.   Unknown at PRN  . amitriptyline (ELAVIL) 25 MG tablet Take 25 mg by mouth at bedtime.    18-24 hours at Unknown  . diazepam (VALIUM) 5 MG tablet Take 5 mg by mouth every 8 (eight) hours as needed for anxiety.   5 Unknown at PRN  . diltiazem (CARDIZEM CD) 120 MG 24 hr capsule Take 120 mg by mouth daily.   12-36 hours at Unknown  . estradiol (ESTRACE) 1 MG tablet Take 1 mg by mouth daily.  1 12-36 hours at Unknown  . furosemide (LASIX) 20 MG tablet Take 20 mg by mouth daily.   1 12-36 hours at Unknown  . gabapentin (NEURONTIN) 300 MG capsule Take 300 mg by mouth 3 (three) times daily.   12-36 hours at Unknown  . lisinopril (ZESTRIL) 10 MG tablet Take 10 mg by mouth daily.   12-36 hours at Unknown  . MAGNESIUM-OXIDE 400 (241.3 Mg) MG tablet Take 400 mg by mouth daily.   1   . metoprolol tartrate (LOPRESSOR) 50 MG tablet Take 50 mg by mouth 2 (two) times daily.   12-36 hours at Unknown  . Multiple Vitamin (MULTIVITAMIN) tablet Take 1 tablet by mouth daily.     Marland Kitchen omeprazole (PRILOSEC) 40 MG capsule Take 40 mg by mouth daily.   12-36 hours at Unknown  . oxyCODONE-acetaminophen (PERCOCET) 10-325 MG tablet Take 1 tablet by mouth 3 (three) times daily.   12-36 hours at Unknown  . predniSONE (DELTASONE) 5 MG tablet Take 5 mg by mouth daily.   12-36 hours at Unknown  . QUEtiapine (SEROQUEL) 25 MG tablet Take 25 mg by mouth at bedtime.   1 24+ hours at Unknown  . TRELEGY ELLIPTA 100-62.5-25 MCG/INH AEPB  Inhale 1 puff into the lungs daily.   12-36 hours at Unknown  . apixaban (ELIQUIS) 5 MG TABS tablet Take 1 tablet (5 mg total) by mouth 2 (two) times daily. 60 tablet 0 12-36 hours at Unknown  . [EXPIRED] levofloxacin (LEVAQUIN) 500 MG tablet Take 1 tablet (500 mg total) by mouth daily for 10 days. 5 tablet 0 12-36 hours at Unknown  .  predniSONE (DELTASONE) 10 MG tablet Take 4 tablets (40 mg total) by mouth daily. 20 tablet 0 12-36 hours at Unknown     Vitals   Vitals:   07/13/20 0615 07/13/20 0758 07/13/20 0858 07/13/20 0930  BP:  (!) 141/91 126/85 (!) 141/90  Pulse:  (!) 123 (!) 103 97  Resp:  20 19 18   Temp:  98.8 F (37.1 C) 99.3 F (37.4 C) 98.8 F (37.1 C)  TempSrc:  Oral Oral Axillary  SpO2:  100% 100%   Weight: 53.7 kg     Height:         Body mass index is 21.63 kg/m.  Physical Exam   NIHSS components Score: Comment  1a Level of Conscious 0[x]  1[]  2[]  3[]      1b LOC Questions 0[x]  1[]  2[]       1c LOC Commands 0[x]  1[]  2[]       2 Best Gaze 0[x]  1[]  2[]       3 Visual 0[x]  1[]  2[]  3[]      4 Facial Palsy 0[x]  1[]  2[]  3[]      5a Motor Arm - left 0[x]  1[]  2[]  3[]  4[]  UN[]    5b Motor Arm - Right 0[x]  1[]  2[]  3[]  4[]  UN[]    6a Motor Leg - Left 0[x]  1[]  2[]  3[]  4[]  UN[]    6b Motor Leg - Right 0[x]  1[]  2[]  3[]  4[]  UN[]    7 Limb Ataxia 0[x]  1[]  2[]  3[]  UN[]     8 Sensory 0[x]  1[]  2[]  UN[]      9 Best Language 0[x]  1[]  2[]  3[]      10 Dysarthria 0[]  1[]  2[]  UN[x]      11 Extinct. and Inattention 0[x]  1[]  2[]       TOTAL: 0     General: Laying comfortably in bed; in no acute distress. HENT: Normal oropharynx and mucosa. Normal external appearance of ears and nose. Neck: Supple, no pain or tenderness CV: No JVD. No peripheral edema.  Pulmonary: Symmetric Chest rise. Normal respiratory effort.  Abdomen: Soft to touch, non-tender.  Ext: No cyanosis, edema, or deformity  Skin: No rash. Normal palpation of skin.   Musculoskeletal: Normal digits and nails by inspection. No  clubbing.   Neurologic Examination  Mental status/Cognition: Alert, oriented to self, good attention. Speech/language: Mouthing words, comprehension intact to simple commands.  Repetition intact and object naming intact. Cranial nerves:   CN II Pupils equal and reactive to light, no VF deficits   CN III,IV,VI EOM intact, no gaze preference or deviation, no nystagmus   CN V normal sensation in V1, V2, and V3 segments bilaterally   CN VII no asymmetry, no nasolabial fold flattening   CN VIII normal hearing to speech   CN IX & X normal palatal elevation, no uvular deviation   CN XI    CN XII midline tongue protrusion   Motor:  Muscle bulk: poor, tone normal, pronator drift none tremor none Mvmt Root Nerve  Muscle Right Left Comments  SA C5/6 Ax Deltoid     EF C5/6 Mc Biceps 5 5   EE C6/7/8 Rad Triceps 5 5   WF C6/7 Med FCR     WE C7/8 PIN ECU     F Ab C8/T1 U ADM/FDI 5 5   HF L1/2/3 Fem Illopsoas 5 5   KE L2/3/4 Fem Quad     DF L4/5 D Peron Tib Ant     PF S1/2 Tibial Grc/Sol      Sensation:  Light touch Intact throughout   Pin  prick    Temperature    Vibration   Proprioception    Coordination/Complex Motor:  - Finger to Nose intact BL - Heel to shin unable to get her to do. - Rapid alternating movement are normal - Gait: Did not assess.  Labs   CBC:  Recent Labs  Lab 07/10/20 0609 07/12/20 0535  WBC 9.7 10.1  NEUTROABS 6.6 7.2  HGB 7.6* 7.6*  HCT 26.1* 25.8*  MCV 96.7 98.1  PLT 403* 416*    Basic Metabolic Panel:  Lab Results  Component Value Date   NA 141 07/12/2020   K 4.3 07/12/2020   CO2 36 (H) 07/12/2020   GLUCOSE 121 (H) 07/12/2020   BUN 16 07/12/2020   CREATININE 0.54 07/12/2020   CALCIUM 8.9 07/12/2020   GFRNONAA >60 07/12/2020   GFRAA >60 04/20/2019   Lipid Panel: No results found for: LDLCALC HgbA1c:  Lab Results  Component Value Date   HGBA1C 5.3 06/06/2020   Urine Drug Screen: No results found for: LABOPIA, COCAINSCRNUR, LABBENZ,  AMPHETMU, THCU, LABBARB  Alcohol Level No results found for: [facility]  CT Head without contrast: Pending.  Impression   Craig StaggersSandra D Kilts is a 70 y.o. female prolonged admission with hypercapnic respiratory failure secondary to narcotics and AE COPD who is status post trach and PEG and awaiting discharge to SNF.  Had an acute event of encephalopathy and some concerns for aphasia out of proportion to encephalopathy.  Stroke code was called, stroke imaging delayed due to respiratory status.  ABG with pH of 7.0, hypoxic.  Exam significantly improved when put on ventilator and patient noted to be following commands and moving all extremities spontaneously and to commands with no focal deficit.  She is mouthing words and able to name objects.  The episode is most likely explained by her respiratory status rather than an acute stroke especially with the noted abnormalities on the ABG and significant improvement when placed on ventilator.  Important to note that she is on Eliquis and an ICH with resultant seizure can also cause an acute change in mental status.  No significant seizure-like activity was noted by any of the staff members however.  Recommendations  - Will get STAT CTH without contrast to rule out small ICH. Eliquis to be resumed after CT Head. - Routine EEG. - Stroke code imaging cancelled given significant improvement in exam and NIHSS of 0 now. ______________________________________________________________________   Thank you for the opportunity to take part in the care of this patient. If you have any further questions, please contact the neurology consultation attending.  Signed,  This patient is critically ill and at significant risk of neurological worsening, death and care requires constant monitoring of vital signs, hemodynamics,respiratory and cardiac monitoring, neurological assessment, discussion with family, other specialists and medical decision making of high complexity. I  spent 35 minutes of neurocritical care time  in the care of  this patient. This was time spent independent of any time provided by nurse practitioner or PA.  Erick BlinksSalman Makyla Bye [facility] Pager Number [phone] _ _ _   _ __   _ __ _ _  __ __   _ __   __ _

## 2020-07-13 NOTE — Progress Notes (Signed)
Brief Neuro Note:  CTH without contrast is negative for an ICH. Will resume Eliquis.  Erick Blinks [facility] Pager Number [phone]

## 2020-07-13 NOTE — Progress Notes (Signed)
PT Cancellation Note  Patient Details Name: Virginia [name] MRN: [id number] DOB: [birth year]   Cancelled Treatment:    Reason Eval/Treat Not Completed: Patient not medically ready.  Chart reviewed.  Pt transferred to CCU 1/20 (today) d/t sudden onset AMS/speech; ABG showed severe metabolic acidosis.  Continue at transfer PT order received 1/20.  Pt noted with potassium of 5.3 and per notes was placed on ventilator in ICU today (pt with trach).  Will hold PT at this time d/t current medical status.  Will monitor pt's status and initiate PT re-evaluation as appropriate.  Hendricks Limes, PT 07/13/20, 3:28 PM

## 2020-07-13 NOTE — Progress Notes (Signed)
PROGRESS NOTE    Virginia [name]  LEX:[id number] DOB: 1950-08-18 DOA: 06/06/2020 PCP: Idelle Crouch, MD   Altered mental status Brief Narrative:  Virginia [name] a 70 y.o.femalesevere COPD admitted with acute on chronic hypoxic hypercapnic respiratory failure and acute encephalopathy secondary to narcotic use and AECOPD requiring mechanical intubation.She was treated with steroids and empiric IV antibiotics. She could not be liberated from the ventilator so tracheostomy was performed and she has been receiving oxygen via trach collar. Now s/p trach/PEG working on trach weaning. 1/15.Patient developed worsening hypoxemia, chest x-ray showed new bilateral lower lobe infiltrates consistent with aspiration pneumonia. Zosyn startedon 1/13. Changed to Augumentin 1/15. Completed 1/18. 1/20.  He had a sudden onset of altered mental status or speech.  ABG showed severe metabolic acidosis.  Transferred to ICU.   Assessment & Plan:   Active Problems:   AF (paroxysmal atrial fibrillation) (HCC)   Anemia of chronic disease   Dysphagia   Respiratory failure (HCC)   COPD exacerbation (HCC)   Acute urinary retention   Acute metabolic encephalopathy   Acute on chronic respiratory failure with hypoxia and hypercapnia (HCC)   Aspiration pneumonia of both lower lobes due to gastric secretions (HCC)   Reactive thrombocytosis  #1.  Acute Encephalopathy. Patient had a sudden onset of slurred speech and altered mental status started at 926am when I walked into the room.  Per nurse, patient appeared normal and talking about 2 or 3 minutes ago. Differential including mucous plug, aspiration pneumonia versus acute intracranial hemorrhage. Stat ABG showed pH of 7.0.  Patient was not responding, code stroke called. Stat CT scan to rule out intracranial hemorrhage. Patient does not have hypoglycemia. Stat labs, and transfer to ICU. Condition is critical, high risk of mortality.  #2.   Severe metabolic acidosis. Etiology still unclear, it appears to be sudden onset. Patient will be transferred to intensive care unit for further work-up and treatment.  3.  Acute on chronic respiratory failure with hypoxemia and hypercapnia. COPD exacerbation. Aspiration pneumonia.\ Condition had improved until today.  #4.  Dysphagia. Deconditioning Moderate protein calorie malnutrition.      DVT prophylaxis: Eliquis Code Status: Full Family Communication:  Disposition Plan:  .   Status is: Inpatient  Remains inpatient appropriate because:Inpatient level of care appropriate due to severity of illness   Dispo: The patient is from: Home              Anticipated d/c is to: Uncertain              Anticipated d/c date is: > 3 days              Patient currently is not medically stable to d/c.        I/O last 3 completed shifts: In: -  Out: 820 [Urine:720; Stool:100] No intake/output data recorded.     Consultants:   ICU, Neurology  Procedures: None  Antimicrobials:  None  Subjective: She had a sudden onset of altered mental status.  She had a slurred speech, then she became unresponsive.  ABG showed a severe acidosis. Glucose 170. No fever or chills.  Objective: Vitals:   07/13/20 0615 07/13/20 0758 07/13/20 0858 07/13/20 0930  BP:  (!) 141/91 126/85 (!) 141/90  Pulse:  (!) 123 (!) 103 97  Resp:  '20 19 18  ' Temp:  98.8 F (37.1 C) 99.3 F (37.4 C) 98.8 F (37.1 C)  TempSrc:  Oral Oral Axillary  SpO2:  100% 100%  Weight: 53.7 kg     Height:        Intake/Output Summary (Last 24 hours) at 07/13/2020 1027 Last data filed at 07/13/2020 3335 Gross per 24 hour  Intake -  Output 820 ml  Net -820 ml   Filed Weights   07/10/20 0500 07/12/20 0347 07/13/20 0615  Weight: 52 kg 54.2 kg 53.7 kg    Examination:  General exam: Unresponsive Respiratory system: Decreased breath sounds. Respiratory effort normal. Cardiovascular system: S1 & S2 heard,  RRR. No JVD, murmurs, rubs, gallops or clicks. No pedal edema. Gastrointestinal system: Abdomen is nondistended, soft and nontender. No organomegaly or masses felt. Normal bowel sounds heard. Central nervous system: Unresponsive. No focal neurological deficits. Extremities: Symmetric  Skin: No rashes, lesions or ulcers     Data Reviewed: I have personally reviewed following labs and imaging studies  CBC: Recent Labs  Lab 07/07/20 0855 07/08/20 0616 07/10/20 0609 07/12/20 0535  WBC 7.1 9.7 9.7 10.1  NEUTROABS 5.1 6.7 6.6 7.2  HGB 7.9* 8.2* 7.6* 7.6*  HCT 25.6* 26.3* 26.1* 25.8*  MCV 93.1 94.9 96.7 98.1  PLT 389 416* 403* 456*   Basic Metabolic Panel: Recent Labs  Lab 07/07/20 0855 07/10/20 0609 07/12/20 0535  NA 140 140 141  K 3.7 4.4 4.3  CL 94* 97* 98  CO2 37* 36* 36*  GLUCOSE 104* 90 121*  BUN '16 16 16  ' CREATININE 0.88 0.58 0.54  CALCIUM 8.8* 8.9 8.9  MG 1.9 1.9 1.7  PHOS 4.3 3.8 3.3   GFR: Estimated Creatinine Clearance: 52.5 mL/min (by C-G formula based on SCr of 0.54 mg/dL). Liver Function Tests: No results for input(s): AST, ALT, ALKPHOS, BILITOT, PROT, ALBUMIN in the last 168 hours. No results for input(s): LIPASE, AMYLASE in the last 168 hours. No results for input(s): AMMONIA in the last 168 hours. Coagulation Profile: No results for input(s): INR, PROTIME in the last 168 hours. Cardiac Enzymes: No results for input(s): CKTOTAL, CKMB, CKMBINDEX, TROPONINI in the last 168 hours. BNP (last 3 results) No results for input(s): PROBNP in the last 8760 hours. HbA1C: No results for input(s): HGBA1C in the last 72 hours. CBG: Recent Labs  Lab 07/12/20 2114 07/13/20 0025 07/13/20 0422 07/13/20 0754 07/13/20 0936  GLUCAP 96 102* 95 118* 170*   Lipid Profile: No results for input(s): CHOL, HDL, LDLCALC, TRIG, CHOLHDL, LDLDIRECT in the last 72 hours. Thyroid Function Tests: No results for input(s): TSH, T4TOTAL, FREET4, T3FREE, THYROIDAB in the last  72 hours. Anemia Panel: No results for input(s): VITAMINB12, FOLATE, FERRITIN, TIBC, IRON, RETICCTPCT in the last 72 hours. Sepsis Labs: Recent Labs  Lab 07/07/20 0855  PROCALCITON <0.10    No results found for this or any previous visit (from the past 240 hour(s)).       Radiology Studies: No results found.      Scheduled Meds: . sodium chloride   Intravenous Once  . apixaban  5 mg Per Tube BID  . arformoterol  15 mcg Nebulization BID  . chlorhexidine gluconate (MEDLINE KIT)  15 mL Mouth Rinse BID  . Chlorhexidine Gluconate Cloth  6 each Topical Daily  . clonazePAM  1 mg Per Tube BID  . feeding supplement (OSMOLITE 1.2 CAL)  237 mL Per Tube 6 X Daily  . feeding supplement (PROSource TF)  45 mL Per Tube Daily  . fentaNYL  1 patch Transdermal Q72H  . free water  60 mL Per Tube 6 X Daily  . gabapentin  300  mg Per Tube TID  . glycopyrrolate  1 mg Per Tube BID  . insulin aspart  0-6 Units Subcutaneous Q4H  . metoprolol tartrate  50 mg Per Tube BID  . pantoprazole (PROTONIX) IV  40 mg Intravenous QHS  . QUEtiapine  25 mg Per Tube QHS  . sodium chloride flush  10-40 mL Intracatheter Q12H  . sodium chloride flush  3 mL Intravenous Q12H   Continuous Infusions: . sodium chloride Stopped (06/25/20 1842)  . sodium chloride    . iron sucrose       LOS: 37 days    Time spent: 45 minutes    Sharen Hones, MD [facility]   To contact the attending provider between 7A-7P or the covering provider during after hours 7P-7A, please log into the web site [email or web address] and access using universal [place] password for that web site. If you do not have the password, please call the hospital operator.  07/13/2020, 10:27 AM

## 2020-07-13 NOTE — Progress Notes (Signed)
Nutrition Follow-up  DOCUMENTATION CODES:   Not applicable  INTERVENTION:  Resume Osmolite 1.2 Cal 1 carton (237 mL) 6 times per day per G-tube + PROSource TF 45 mL once daily per tube. Provides 1750 kcal, 90 grams of protein, 1170 mL H2O daily.  Flush tube with 30 mL before and after each bolus feeding. Provides a total of 1530 mL H2O daily including water in tube feed regimen.  Maintain HOB >30-45 degrees for at least 45 minutes after each bolus feeding.  NUTRITION DIAGNOSIS:   Increased nutrient needs related to catabolic illness (COPD) as evidenced by estimated needs.  Ongoing.  GOAL:   Patient will meet greater than or equal to 90% of their needs  Met with TF regimen.  MONITOR:   PO intake,Diet advancement,TF tolerance,Labs,Weight trends  REASON FOR ASSESSMENT:   Ventilator,Consult Assessment of nutrition requirement/status,Enteral/tube feeding initiation and management  ASSESSMENT:   70 year old female with PMHx of COPD, hx tracheostomy tube placement and PEG tube placement in 2017, HTN, depression, RLS, osteoporosis, hepatitis C, A-fib, anxiety, arthritis admitted with acute on chronic hypoxic hypercapnic respiratory failure and acute encephalopathy secondary to narcotic use and acute exacerbation of COPD.  12/14 intubated 12/16 extubated 12/18 reintubated and TFs restarted 12/23 s/p tracheostomy tube placement 12/23 Dobbhoff tube was placed after patient returned from trach and TFs were restarted 12/25 pt pulled out Dobbhoff tube and refused replacement 12/29 s/p PEG tube placement 1/5 pt changed to bolus regimen with Jevity 1.2 Cal  1/11 pt changed to bolus regimen with Osmolite 1.2 Cal  This morning patient developed sudden onset of AMS and was unresponsive. She was transferred to ICU and placed on ventilator and mental status improved rapidly. Patient became alert and was able to follow commands. On discussion with MD this AM plan was to hold feeds today.  Received message from RN this afternoon that plan is to resume tube feeds. Discussed with MD and plan is to resume patient's bolus tube feed regimen. Plan is for likely transition back to trach collar tomorrow.  Patient is currently intubated on ventilator support MV: 7.4 L/min Temp (24hrs), Avg:98.5 F (36.9 C), Min:97.6 F (36.4 C), Max:99.3 F (37.4 C)  Medications reviewed and include: fentanyl patch, Novolog 0-6 units Q4hrs, Protonix.  Labs reviewed: CBG 76-87, Potassium 5.3, Chloride 93, CO2 37.  I/O: 720 mL UOP yesterday (0.6 mL/kg/hr)  Enteral Access: 20 Fr. Endovive Safety G-tube  Diet Order:   Diet Order            Diet NPO time specified  Diet effective now                EDUCATION NEEDS:   No education needs have been identified at this time  Skin:  Skin Assessment: Reviewed RN Assessment (MASD to groin and perineum) Skin Integrity Issues:: Incisions Incisions: closed incision to neck  Last BM:  07/12/2020 - type 7  Height:   Ht Readings from Last 1 Encounters:  07/13/20 '5\' 2"'  (1.575 m)   Weight:   Wt Readings from Last 1 Encounters:  07/13/20 56.3 kg   Ideal Body Weight:  50 kg  BMI:  Body mass index is 22.7 kg/m.  Estimated Nutritional Needs:   Kcal:  1500-1750  Protein:  85-95 grams  Fluid:  1.5-1.7 L/day  Jacklynn Barnacle, MS, RD, LDN Pager number available on Amion

## 2020-07-14 DIAGNOSIS — J440 Chronic obstructive pulmonary disease with acute lower respiratory infection: Secondary | ICD-10-CM

## 2020-07-14 DIAGNOSIS — Z515 Encounter for palliative care: Secondary | ICD-10-CM | POA: Diagnosis not present

## 2020-07-14 DIAGNOSIS — L8990 Pressure ulcer of unspecified site, unspecified stage: Secondary | ICD-10-CM | POA: Insufficient documentation

## 2020-07-14 DIAGNOSIS — R197 Diarrhea, unspecified: Secondary | ICD-10-CM

## 2020-07-14 DIAGNOSIS — J9621 Acute and chronic respiratory failure with hypoxia: Secondary | ICD-10-CM | POA: Diagnosis not present

## 2020-07-14 DIAGNOSIS — I480 Paroxysmal atrial fibrillation: Secondary | ICD-10-CM | POA: Diagnosis not present

## 2020-07-14 LAB — CBC WITH DIFFERENTIAL/PLATELET
Abs Immature Granulocytes: 0.13 10*3/uL — ABNORMAL HIGH (ref 0.00–0.07)
Basophils Absolute: 0.1 10*3/uL (ref 0.0–0.1)
Basophils Relative: 0 %
Eosinophils Absolute: 0.1 10*3/uL (ref 0.0–0.5)
Eosinophils Relative: 1 %
HCT: 23.8 % — ABNORMAL LOW (ref 36.0–46.0)
Hemoglobin: 7.4 g/dL — ABNORMAL LOW (ref 12.0–15.0)
Immature Granulocytes: 1 %
Lymphocytes Relative: 19 %
Lymphs Abs: 2.4 10*3/uL (ref 0.7–4.0)
MCH: 29.5 pg (ref 26.0–34.0)
MCHC: 31.1 g/dL (ref 30.0–36.0)
MCV: 94.8 fL (ref 80.0–100.0)
Monocytes Absolute: 1.2 10*3/uL — ABNORMAL HIGH (ref 0.1–1.0)
Monocytes Relative: 10 %
Neutro Abs: 8.5 10*3/uL — ABNORMAL HIGH (ref 1.7–7.7)
Neutrophils Relative %: 69 %
Platelets: 382 10*3/uL (ref 150–400)
RBC: 2.51 MIL/uL — ABNORMAL LOW (ref 3.87–5.11)
RDW: 16.8 % — ABNORMAL HIGH (ref 11.5–15.5)
WBC: 12.4 10*3/uL — ABNORMAL HIGH (ref 4.0–10.5)
nRBC: 0.6 % — ABNORMAL HIGH (ref 0.0–0.2)

## 2020-07-14 LAB — PROCALCITONIN: Procalcitonin: 0.28 ng/mL

## 2020-07-14 LAB — GLUCOSE, CAPILLARY
Glucose-Capillary: 187 mg/dL — ABNORMAL HIGH (ref 70–99)
Glucose-Capillary: 72 mg/dL (ref 70–99)
Glucose-Capillary: 74 mg/dL (ref 70–99)
Glucose-Capillary: 86 mg/dL (ref 70–99)
Glucose-Capillary: 87 mg/dL (ref 70–99)
Glucose-Capillary: 91 mg/dL (ref 70–99)

## 2020-07-14 MED ORDER — PIPERACILLIN-TAZOBACTAM 3.375 G IVPB
3.3750 g | Freq: Three times a day (TID) | INTRAVENOUS | Status: AC
  Administered 2020-07-14 – 2020-07-18 (×14): 3.375 g via INTRAVENOUS
  Filled 2020-07-14 (×14): qty 50

## 2020-07-14 NOTE — Progress Notes (Signed)
PT Cancellation Note  Patient Details Name: Virginia [name] MRN: [id number] DOB: 06-06-1951   Cancelled Treatment:    Reason Eval/Treat Not Completed: Patient not medically ready: Pt noted with potassium of 5.3 falling outside guidelines for participation with PT services.  Per notes was placed on ventilator in ICU 07/13/20 (pt with trach). Pt with current MEWS score of 5.  Will monitor pt's status and initiate PT re-evaluation as appropriate.   Ovidio Hanger PT, DPT 07/14/20, 9:07 AM

## 2020-07-14 NOTE — Progress Notes (Addendum)
CRITICAL CARE NOTE Acute on chronic hypercapnic resp failure COPD pneumonia  CC  follow up respiratory failure  SUBJECTIVE Patient remains critically ill Prognosis is guarded She remains on ventilator overnight.  She has developed profuse diarrhea with mild abdominal pain.  Has had low-grade fever.  No nausea or vomiting.  She says yes to being short of breath.  She seems to have copious endotracheal secretions.  No chest pain.  She refused to be tried on trach collar today and feels that she is short of breath.      SIGNIFICANT EVENTS Profuse diarrhea    BP (!) 136/93   Pulse 98   Temp 99.9 F (37.7 C) (Oral)   Resp 16   Ht 5' 2.01" (1.575 m)   Wt 56.7 kg   SpO2 93%   BMI 22.86 kg/m    REVIEW OF SYSTEMS  PATIENT IS UNABLE TO PROVIDE COMPLETE REVIEW OF SYSTEMS DUE TO SEVERE CRITICAL ILLNESS   PHYSICAL EXAMINATION:  GENERAL:critically ill appearing, no resp distress HEAD: Normocephalic, atraumatic.  EYES: Pupils equal, round, reactive to light.  No scleral icterus.  MOUTH: Moist mucosal membrane. NECK: Supple. No thyromegaly. No nodules. No JVD.  PULMONARY: +rhonchi, no wheezing CARDIOVASCULAR: S1 and S2. Regular rate and rhythm. No murmurs, rubs, or gallops.  GASTROINTESTINAL: Soft, nontender, -distended. No masses. Positive bowel sounds. No hepatosplenomegaly.  MUSCULOSKELETAL: No swelling, clubbing, or edema.  NEUROLOGIC: Awake and alert follows commands moves all extremities.  No focal deficits SKIN:intact,warm,dry  INTAKE/OUTPUT  Intake/Output Summary (Last 24 hours) at 07/14/2020 1133 Last data filed at 07/14/2020 0700 Gross per 24 hour  Intake 600 ml  Output 702 ml  Net -102 ml    LABS  CBC Recent Labs  Lab 07/12/20 0535 07/13/20 1104 07/14/20 0340  WBC 10.1 13.1* 12.4*  HGB 7.6* 7.9* 7.4*  HCT 25.8* 27.4* 23.8*  PLT 416* 427* 382   Coag's Recent Labs  Lab 07/13/20 1104  INR 1.3*   BMET Recent Labs  Lab 07/12/20 0535  07/13/20 1104 07/13/20 1507  NA 141 138 136  K 4.3 5.3* 5.3*  CL 98 93* 91*  CO2 36* 37* 33*  BUN _0 CREATININE 0.54 0.79 0.90  GLUCOSE 121* 256* 85   Electrolytes Recent Labs  Lab 07/10/20 0609 07/12/20 0535 07/13/20 1104 07/13/20 1507  CALCIUM 8.9 8.9 9.1 9.0  MG 1.9 1.7 1.8  --   PHOS 3.8 3.3  --   --    Sepsis Markers Recent Labs  Lab 07/13/20 1104 07/13/20 1507 07/13/20 1708 07/14/20 0340  LATICACIDVEN 1.8 2.2* 1.4  --   PROCALCITON  --  0.13  --  0.28   ABG Recent Labs  Lab 07/13/20 0929 07/13/20 1403  PHART 7.00* 7.52*  PCO2ART <19.0* 46  PO2ART 38* 114*   Liver Enzymes Recent Labs  Lab 07/13/20 1104  AST 31  ALT 23  ALKPHOS 19*  BILITOT 0.3  ALBUMIN 2.9*   Cardiac Enzymes No results for input(s): TROPONINI, PROBNP in the last 168 hours. Glucose Recent Labs  Lab 07/13/20 1526 07/13/20 1610 07/13/20 1932 07/14/20 0011 07/14/20 0340 07/14/20 0842  GLUCAP 71 67* 85 187* 74 87     Recent Results (from the past 240 hour(s))  Culture, respiratory (non-expectorated)     Status: None (Preliminary result)   Collection Time: 07/13/20  4:20 PM   Specimen: Tracheal Aspirate; Respiratory  Result Value Ref Range Status   Specimen Description   Final    TRACHEAL  ASPIRATE Performed at [facility], [address] [place], [place] [address]    Special Requests   Final    NONE Performed at [facility], [place], [place] [address]    Gram Stain   Final    NO WBC SEEN ABUNDANT GRAM POSITIVE RODS Performed at [facility], [place] [address], [place], [place] [address]    Culture PENDING  Incomplete   Report Status PENDING  Incomplete    MEDICATIONS   Current Facility-Administered Medications:  .  0.9 %  sodium chloride infusion (Manually program via Guardrails IV Fluids), , Intravenous, Once, Rust-Chester, Huel Cote, NP, Held at 06/18/20 [phone] .  0.9 %  sodium chloride infusion, 250 mL,  Intravenous, Continuous, Duffy Bruce, MD, Stopped at 06/25/20 1842 .  0.9 %  sodium chloride infusion, 250 mL, Intravenous, PRN, Flora Lipps, MD .  acetaminophen (TYLENOL) 160 MG/5ML solution 650 mg, 650 mg, Per Tube, Q4H PRN, Flora Lipps, MD, 650 mg at 07/13/20 2221 .  apixaban (ELIQUIS) tablet 5 mg, 5 mg, Per Tube, BID, Hall, Scott A, RPH, 5 mg at 07/14/20 1102 .  arformoterol (BROVANA) nebulizer solution 15 mcg, 15 mcg, Nebulization, BID, Candee Furbish, MD, 15 mcg at 07/13/20 0739 .  chlorhexidine gluconate (MEDLINE KIT) (PERIDEX) 0.12 % solution 15 mL, 15 mL, Mouth Rinse, BID, Kasa, Kurian, MD, 15 mL at 07/13/20 1934 .  chlorhexidine gluconate (MEDLINE KIT) (PERIDEX) 0.12 % solution 15 mL, 15 mL, Mouth Rinse, BID, Sharen Hones, MD, 15 mL at 07/14/20 0940 .  Chlorhexidine Gluconate Cloth 2 % PADS 6 each, 6 each, Topical, Daily, Flora Lipps, MD, 6 each at 07/14/20 1103 .  clonazePAM (KLONOPIN) tablet 1 mg, 1 mg, Per Tube, BID, Candee Furbish, MD, 1 mg at 07/14/20 1102 .  diazepam (VALIUM) tablet 5 mg, 5 mg, Per Tube, Q6H PRN, Awilda Bill, NP, 5 mg at 07/14/20 1114 .  feeding supplement (OSMOLITE 1.2 CAL) liquid 237 mL, 237 mL, Per Tube, 6 X Daily, Rosine Door, MD, 237 mL at 07/14/20 0510 .  feeding supplement (PROSource TF) liquid 45 mL, 45 mL, Per Tube, Daily, Rosine Door, MD, 45 mL at 07/14/20 1103 .  fentaNYL (DURAGESIC) 50 MCG/HR 1 patch, 1 patch, Transdermal, Q72H, Tyler Pita, MD, 1 patch at 07/11/20 1109 .  free water 60 mL, 60 mL, Per Tube, 6 X Daily, Val Riles, MD, 60 mL at 07/14/20 0511 .  gabapentin (NEURONTIN) capsule 300 mg, 300 mg, Per Tube, TID, Tyler Pita, MD, 300 mg at 07/14/20 1102 .  glycopyrrolate (ROBINUL) tablet 1 mg, 1 mg, Per Tube, BID, Lanney Gins, Fuad, MD, 1 mg at 07/14/20 1102 .  insulin aspart (novoLOG) injection 0-6 Units, 0-6 Units, Subcutaneous, Q4H, Dallie Piles, RPH, 1 Units at 07/14/20 0017 .  iohexol (OMNIPAQUE) 350 MG/ML  injection 75 mL, 75 mL, Intravenous, Once PRN, Sharen Hones, MD .  ipratropium-albuterol (DUONEB) 0.5-2.5 (3) MG/3ML nebulizer solution 3 mL, 3 mL, Nebulization, Q6H PRN, Awilda Bill, NP, 3 mL at 07/13/20 0611 .  iron sucrose (VENOFER) 300 mg in sodium chloride 0.9 % 250 mL IVPB, 300 mg, Intravenous, Once, Sharen Hones, MD .  labetalol (NORMODYNE) injection 10 mg, 10 mg, Intravenous, Q4H PRN, Tyna Jaksch, MD, 10 mg at 06/28/20 3818 .  MEDLINE mouth rinse, 15 mL, Mouth Rinse, 10 times per day, Sharen Hones, MD, 15 mL at 07/14/20 1004 .  metoprolol tartrate (LOPRESSOR) injection 5 mg, 5 mg,  Intravenous, Q3H PRN, Ottie Glazier, MD, 5 mg at 06/17/20 2006 .  metoprolol tartrate (LOPRESSOR) tablet 50 mg, 50 mg, Per Tube, BID, Candee Furbish, MD, 50 mg at 07/14/20 1102 .  naphazoline-glycerin (CLEAR EYES REDNESS) ophth solution 1-2 drop, 1-2 drop, Both Eyes, QID PRN, Sharen Hones, MD, 2 drop at 07/12/20 1040 .  ondansetron (ZOFRAN) injection 4 mg, 4 mg, Intravenous, Q6H PRN, Flora Lipps, MD, 4 mg at 07/14/20 1000 .  oxyCODONE (Oxy IR/ROXICODONE) immediate release tablet 10 mg, 10 mg, Per Tube, Q4H PRN, Candee Furbish, MD, 10 mg at 07/01/20 1625 .  pantoprazole (PROTONIX) injection 40 mg, 40 mg, Intravenous, QHS, Dallie Piles, RPH, 40 mg at 07/13/20 2221 .  piperacillin-tazobactam (ZOSYN) IVPB 3.375 g, 3.375 g, Intravenous, Q8H, Saiquan Hands, MD .  polyethylene glycol (MIRALAX / GLYCOLAX) packet 17 g, 17 g, Per Tube, Daily PRN, Flora Lipps, MD, 17 g at 07/13/20 0904 .  QUEtiapine (SEROQUEL) tablet 25 mg, 25 mg, Per Tube, QHS, Tyler Pita, MD, 25 mg at 07/13/20 2222 .  sodium chloride flush (NS) 0.9 % injection 10-40 mL, 10-40 mL, Intracatheter, Q12H, Tyler Pita, MD, 10 mL at 07/14/20 1104 .  sodium chloride flush (NS) 0.9 % injection 10-40 mL, 10-40 mL, Intracatheter, PRN, Tyler Pita, MD, 10 mL at 06/30/20 2104 .  sodium chloride flush (NS) 0.9 % injection 3 mL,  3 mL, Intravenous, Q12H, Kasa, Kurian, MD, 3 mL at 07/14/20 1104 .  sodium chloride flush (NS) 0.9 % injection 3 mL, 3 mL, Intravenous, PRN, Flora Lipps, MD      Indwelling Urinary Catheter continued, requirement due to   Reason to continue Indwelling Urinary Catheter for strict Intake/Output monitoring for hemodynamic instability   Central Line continued, requirement due to   Reason to continue [employer] Monitoring of central venous pressure or other hemodynamic parameters   Ventilator continued, requirement due to, resp failure    Ventilator Sedation RASS 0 to -2     ASSESSMENT AND PLAN SYNOPSIS 1. Acute on chronic hypercapnic resp failure , Acute onset no obvious reason ? Mucous plug but not much suctioned from the tracheostomy.  It is possible that her CO2 may have built up over the last day or so.  CT scanning of the head does not show any significant pathology to explain her change in mental status.  We will continue full ventilator support and follow ABGs. ? Aspiration pneumonia, started on Zosyn, procal trending up but still low, elevated wbc count with low grade fever   2.AcuteEncephalopathy.resolved  Patient had a sudden onset of slurred speech and altered mental status started at926amas per report from the floor and MD.Blood sugar was normal. CT head unremarkable for any acute strokes or bleeding to explain her sudden onset of change in mental status. MS has normalized without any focal deficit.  3. Diarrhea has been on abx, ? C diff,will check    4.COPD apperas stabale without wheezing will cont nebs, full vent support. No need of IV steroids   5.Hypotension r/o sepsis , check blod, urine and ETS cultures, check pro calcitonin. Hold all BP meds for now BP stable not on any pressors  6.Anxiety cont klonopin  7. Paroxysmal AIB cont apixaban , NSR at present  8. Nutrition on tube feeds   DVT prophylaxis:Eliquis Code  Status:Full Family Communication:d/w DIL in detail  Disposition Plan:   Discussed in multidisciplinary rounds. Discussed with daughter-in-law who was present at the bedside.  All her  questions were answered.  She was thankful for the discussion.  Critical Care Time devoted to patient care services described in this note is 32 minutes.   Overall, patient is critically ill, prognosis is guarded.  Patient with Multiorgan failure and at high risk for cardiac arrest and death.   Rosine Door, MD  07/18/2020 11:33 AM [facility]

## 2020-07-14 NOTE — Progress Notes (Signed)
Brief Neuro Update:  Disussed with CCM team. Suspect that her episode was likely due to respiratory cause, specially given ABG findings. Her exam yesterday with awake, alert, following commands in all 4 and mouthing words is non focal and with intact cognition. At this time, we will signoff. Please feel free to contact us with any questions or concerns.  Erick Blinks [facility] Pager Number [phone]

## 2020-07-14 NOTE — TOC Progression Note (Signed)
Transition of Care [facility]) - Progression Note    Patient Details  Name: Virginia [name] MRN: [id number] Date of Birth: [birth year]  [facility] CM/SW Contact  Marina Goodell Phone Number:  [phone] 07/14/2020, 3:13 PM  Clinical Narrative:     CSW spoke with patient's DIL [name] [phone] with update on patient.  Ms. Dan Humphreys stated the patient was trying to transfer to [facility] but there was an issue with her insurance, which is out-of-network.  Ms. Dan Humphreys stated the [facility] [place], but [facility] not [facility] [place].  Ms. Dan Humphreys stated she has spoken to Lurena Joiner RN at [facility] about placement but has not received an update.  Ms. Dan Humphreys stated she was the one who found out the patient was out-of-network with [facility] because she called the insurance company directly. CSW stated that I would contact Lurena Joiner RN [facility] and if I received an update I would let Ms. Walker know.  CSW contacted Lurena Joiner RN [facility] and she stated they could not accept before because of her insurance, and since the patient is back in the ICU she would like to wait until Monday to ensure the patient is more stable before she runs insurance again.  CSW called Ms. Walker and updated her on my conversation w/ Nurse, [facility].  Ms. Dan Humphreys vocalized frustration but stated she understood this was a process.  Ms. Dan Humphreys stated she and the patient were anxious for her to leave the hospital.      Barriers to Discharge: Continued Medical Work up  Expected Discharge Plan and Services   In-house Referral: Clinical Social Work,Hospice / Palliative Care   Post Acute Care Choice: Durable Medical Equipment (Oxygen 4L) Living arrangements for the past 2 months: Apartment                                       Social Determinants of Health (SDOH) Interventions    Readmission Risk Interventions No flowsheet data found.

## 2020-07-14 NOTE — Care Plan (Signed)
This patient was transferred to ICU yesterday.  70 year old female with severe COPD admitted with acute on chronic hypoxic and hypercapnic respiratory failure and acute encephalopathy secondary to narcotic use and acute on chronic COPD requiring mechanical intubation.  We will sign off at this time.  We will talk over once patient is out of ICU.

## 2020-07-14 NOTE — Progress Notes (Signed)
OT Cancellation Note  Patient Details Name: Virginia [name] MRN: [id number] DOB: August 12, 1950   Cancelled Treatment:    Reason Eval/Treat Not Completed: Medical issues which prohibited therapy. Chart reviewed. Pt transferred to CCU 1/20 d/t sudden onset AMS/speech; ABG showed severe metabolic acidosis.  Note "Discontinue at Transfer" received on 1/20 1418 for OT. Pt also noted with K+ of 5.3, per notes placed on ventilator in ICU on 1/20 (pt with trach), and this morning noted with a MEWS score of 5. Will complete current OT orders. Please re-consult at later date/time as medically appropriate to re-evaluate pt.  Richrd Prime, MPH, MS, OTR/L ascom [phone] 07/14/20, 7:30 AM

## 2020-07-14 NOTE — Progress Notes (Signed)
Daily Progress Note   Patient Name: Virginia [name]       Date: 07/14/2020 DOB: 09-16-1950  Age: 70 y.o. MRN#: [id number] Attending Physician: Rosine Door, MD Primary Care Physician: Idelle Crouch, MD Admit Date: 06/06/2020  Reason for Consultation/Follow-up: Establishing goals of care, escalation in care  Subjective: Patient is resting in bed with DIL at bedside. Patient and family are aware of patient's status and plans moving forward. DIL discusses that they have been through this before when Ms. Durney had a tracheostomy. She states [facility] is discussing care plans to transition patient to nocturnal ventilation and TC during the day. Patient states she wants to go home, but is amenable to her current care and plan. She would like to continue full aggressive care.    Length of Stay: 38  Current Medications: Scheduled Meds:   sodium chloride   Intravenous Once   apixaban  5 mg Per Tube BID   arformoterol  15 mcg Nebulization BID   chlorhexidine gluconate (MEDLINE KIT)  15 mL Mouth Rinse BID   chlorhexidine gluconate (MEDLINE KIT)  15 mL Mouth Rinse BID   Chlorhexidine Gluconate Cloth  6 each Topical Daily   clonazePAM  1 mg Per Tube BID   feeding supplement (OSMOLITE 1.2 CAL)  237 mL Per Tube 6 X Daily   feeding supplement (PROSource TF)  45 mL Per Tube Daily   fentaNYL  1 patch Transdermal Q72H   free water  60 mL Per Tube 6 X Daily   gabapentin  300 mg Per Tube TID   glycopyrrolate  1 mg Per Tube BID   insulin aspart  0-6 Units Subcutaneous Q4H   mouth rinse  15 mL Mouth Rinse 10 times per day   metoprolol tartrate  50 mg Per Tube BID   pantoprazole (PROTONIX) IV  40 mg Intravenous QHS   QUEtiapine  25 mg Per Tube QHS   sodium chloride flush  10-40  mL Intracatheter Q12H   sodium chloride flush  3 mL Intravenous Q12H    Continuous Infusions:  sodium chloride Stopped (06/25/20 1842)   sodium chloride     iron sucrose     piperacillin-tazobactam (ZOSYN)  IV 3.375 g (07/14/20 1237)    PRN Meds: sodium chloride, acetaminophen (TYLENOL) oral liquid 160 mg/5 mL, diazepam, iohexol, ipratropium-albuterol,  labetalol, metoprolol tartrate, naphazoline-glycerin, ondansetron (ZOFRAN) IV, oxyCODONE, sodium chloride flush, sodium chloride flush  Physical Exam Pulmonary:     Comments: On ventilator support.  Neurological:     Mental Status: She is alert.             Vital Signs: BP (!) 138/124    Pulse (!) 39    Temp 99.9 F (37.7 C) (Oral)    Resp (!) 26    Ht 5' 2.01" (1.575 m)    Wt 56.7 kg    SpO2 (!) 77%    BMI 22.86 kg/m  SpO2: SpO2: (!) 77 % O2 Device: O2 Device: Ventilator O2 Flow Rate: O2 Flow Rate (L/min): 4 L/min  Intake/output summary:   Intake/Output Summary (Last 24 hours) at 07/14/2020 1350 Last data filed at 07/14/2020 0700 Gross per 24 hour  Intake 600 ml  Output 702 ml  Net -102 ml   LBM: Last BM Date: 07/14/20 Baseline Weight: Weight: 58.9 kg Most recent weight: Weight: 56.7 kg       Palliative Assessment/Data:    Flowsheet Rows   Flowsheet Row Most Recent Value  Intake Tab   Referral Department Hospitalist  Unit at Time of Referral ICU  Palliative Care Primary Diagnosis Pulmonary  Date Notified 06/16/20  Palliative Care Type New Palliative care  Reason for referral Clarify Goals of Care  Date of Admission 06/06/20  Date first seen by Palliative Care 06/16/20  # of days Palliative referral response time 0 Day(s)  # of days IP prior to Palliative referral 10  Clinical Assessment   Palliative Performance Scale Score 20%  Pain Max last 24 hours Not able to report  Pain Min Last 24 hours Not able to report  Dyspnea Max Last 24 Hours Not able to report  Dyspnea Min Last 24 hours Not able to  report  Psychosocial & Spiritual Assessment   Palliative Care Outcomes       Patient Active Problem List   Diagnosis Date Noted   Pressure injury of skin 07/14/2020   Diarrhea    Reactive thrombocytosis 07/12/2020   Aspiration pneumonia of both lower lobes due to gastric secretions ([place]) 07/07/2020   Acute urinary retention 38/75/6433   Acute metabolic encephalopathy 29/51/8841   Acute on chronic respiratory failure with hypoxia and hypercapnia (H[facility]) 07/06/2020   COPD exacerbation ([facility])    Respiratory failure ([place]) 06/06/2020   Acute respiratory failure with hypoxia ([place]) 06/02/2020   COPD with acute exacerbation ([place]) 06/02/2020   Shortness of breath 06/02/2020   GERD (gastroesophageal reflux disease) 06/02/2020   Iron deficiency anemia 01/22/2020   Anemia of chronic disease 01/17/2019   Coronary artery disease involving native coronary artery of native heart 04/30/2018   Palpitations 04/30/2018   Chronic heartburn 04/12/2018   Dysphagia 04/12/2018   Thoracic aortic aneurysm without rupture ([place]) 08/27/2017   Status post reverse total shoulder replacement, 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   SOBOE (shortness of breath on exertion) 01/24/2017   AF (paroxysmal atrial fibrillation) ([place]) 11/09/2016   Chronic hypoxemic respiratory failure ([place]) 12/06/2015   Depression with anxiety 12/06/2015   Fibromyalgia 12/06/2015   Hep C w/o coma, chronic ([place]) 12/06/2015   History of tracheostomy 12/06/2015   HTN, goal below 140/80 12/06/2015   Localized osteoporosis without current pathological fracture 12/06/2015   PEG (percutaneous endoscopic gastrostomy) status ([place]) 12/06/2015   Peripheral neuropathy, idiopathic 12/06/2015   RLS (restless legs syndrome) 12/06/2015   Substance abuse in remission ([place]) 12/06/2015   COPD with acute lower  respiratory infection ([place]) 11/29/2015    Palliative Care Assessment & Plan    Recommendations/Plan: Recommend palliative to  follow outpatient.   Code Status:    Code Status Orders  (From admission, onward)         Start     Ordered   06/06/20 1430  Full code  Continuous        06/06/20 1434        Code Status History    Date Active Date Inactive Code Status Order ID Comments User Context   06/02/2020 2233 06/04/2020 2138 Full Code [id number]  Rhetta Mura DO ED   03/06/2017 1238 03/07/2017 2049 Full Code [id number]  Corky Mull, MD Inpatient   11/09/2016 2235 11/10/2016 1625 Full Code [id number]  Dustin Flock, MD Inpatient   Advance Care Planning Activity      Prognosis:  Unable to determine   Thank you for allowing the Palliative Medicine Team to assist in the care of this patient.   Total Time 15 min Prolonged Time Billed  no      Greater than 50%  of this time was spent counseling and coordinating care related to the above assessment and plan.  Asencion Gowda, NP  Please contact Palliative Medicine Team phone at [phone] for questions and concerns.

## 2020-07-14 NOTE — Progress Notes (Signed)
SLP Cancellation Note  Patient Details Name: Virginia [name] MRN: [id number] DOB: 1950-09-20   Cancelled treatment:       Reason Eval/Treat Not Completed: Patient not medically ready;Medical issues which prohibited therapy (chart reviewed; consulted NSG).  Pt has transferred back to CCU and now requires vent support, night/day. Pt was last seen on 07/06/2020 for PMV tx. She had begun to have more discomfort and difficulty w/ breathing when wearing the PMV vs when she initially began use of it. Pt appears to have a decline in Pulmonary status; also had difficulty wearing and tolerating the PMV taking a rest from use of it. Pt had been tolerating Pleasure sips of thin liquids (clears) when able to wear the PMV. However, w/ her Pulmonary status, recommend holding on PMV use/wear until next venue of care and/or Rehab when pt will be improved medically w/ a stronger Pulmonary status for taking on the challenge of PMV use/wear. Recommend frequent oral care for hygiene and stimulation of swallowing while NPO w/ PEG TFs. Education left in room. NSG updated and agreed.        Jerilynn Som, MS, CCC-SLP Speech Language Pathologist Rehab Services [phone] [facility] 07/14/2020, 2:48 PM

## 2020-07-15 DIAGNOSIS — D638 Anemia in other chronic diseases classified elsewhere: Secondary | ICD-10-CM | POA: Diagnosis not present

## 2020-07-15 DIAGNOSIS — J9621 Acute and chronic respiratory failure with hypoxia: Secondary | ICD-10-CM | POA: Diagnosis not present

## 2020-07-15 DIAGNOSIS — G9341 Metabolic encephalopathy: Secondary | ICD-10-CM | POA: Diagnosis not present

## 2020-07-15 DIAGNOSIS — I480 Paroxysmal atrial fibrillation: Secondary | ICD-10-CM | POA: Diagnosis not present

## 2020-07-15 LAB — GLUCOSE, CAPILLARY
Glucose-Capillary: 110 mg/dL — ABNORMAL HIGH (ref 70–99)
Glucose-Capillary: 73 mg/dL (ref 70–99)
Glucose-Capillary: 80 mg/dL (ref 70–99)
Glucose-Capillary: 86 mg/dL (ref 70–99)
Glucose-Capillary: 89 mg/dL (ref 70–99)
Glucose-Capillary: 90 mg/dL (ref 70–99)
Glucose-Capillary: 95 mg/dL (ref 70–99)

## 2020-07-15 LAB — BASIC METABOLIC PANEL
Anion gap: 11 (ref 5–15)
BUN: 26 mg/dL — ABNORMAL HIGH (ref 8–23)
CO2: 33 mmol/L — ABNORMAL HIGH (ref 22–32)
Calcium: 8.9 mg/dL (ref 8.9–10.3)
Chloride: 94 mmol/L — ABNORMAL LOW (ref 98–111)
Creatinine, Ser: 1.34 mg/dL — ABNORMAL HIGH (ref 0.44–1.00)
GFR, Estimated: 43 mL/min — ABNORMAL LOW (ref >60)
Glucose, Bld: 93 mg/dL (ref 70–99)
Potassium: 3.7 mmol/L (ref 3.5–5.1)
Sodium: 138 mmol/L (ref 135–145)

## 2020-07-15 LAB — BLOOD GAS, ARTERIAL
Acid-Base Excess: 10.8 mmol/L — ABNORMAL HIGH (ref 0.0–2.0)
Bicarbonate: 36.9 mmol/L — ABNORMAL HIGH (ref 20.0–28.0)
FIO2: 0.4
O2 Saturation: 98.2 %
Patient temperature: 37
pCO2 arterial: 61 mmHg — ABNORMAL HIGH (ref 32.0–48.0)
pH, Arterial: 7.39 (ref 7.350–7.450)
pO2, Arterial: 109 mmHg — ABNORMAL HIGH (ref 83.0–108.0)

## 2020-07-15 LAB — CBC
HCT: 21.3 % — ABNORMAL LOW (ref 36.0–46.0)
Hemoglobin: 6.5 g/dL — ABNORMAL LOW (ref 12.0–15.0)
MCH: 28.8 pg (ref 26.0–34.0)
MCHC: 30.5 g/dL (ref 30.0–36.0)
MCV: 94.2 fL (ref 80.0–100.0)
Platelets: 319 10*3/uL (ref 150–400)
RBC: 2.26 MIL/uL — ABNORMAL LOW (ref 3.87–5.11)
RDW: 17 % — ABNORMAL HIGH (ref 11.5–15.5)
WBC: 9.5 10*3/uL (ref 4.0–10.5)
nRBC: 0.4 % — ABNORMAL HIGH (ref 0.0–0.2)

## 2020-07-15 LAB — HEMOGLOBIN AND HEMATOCRIT, BLOOD
HCT: 27 % — ABNORMAL LOW (ref 36.0–46.0)
Hemoglobin: 8.2 g/dL — ABNORMAL LOW (ref 12.0–15.0)

## 2020-07-15 LAB — PREPARE RBC (CROSSMATCH)

## 2020-07-15 LAB — PROCALCITONIN: Procalcitonin: 0.18 ng/mL

## 2020-07-15 LAB — MAGNESIUM: Magnesium: 1.9 mg/dL (ref 1.7–2.4)

## 2020-07-15 LAB — PHOSPHORUS: Phosphorus: 5.5 mg/dL — ABNORMAL HIGH (ref 2.5–4.6)

## 2020-07-15 MED ORDER — SODIUM CHLORIDE 0.9% IV SOLUTION: Freq: Once | INTRAVENOUS | Status: DC

## 2020-07-15 NOTE — Progress Notes (Signed)
CRITICAL CARE NOTE Acute on chronic hypercapnic resp failure COPD pneumonia   CC  follow up respiratory failure  SUBJECTIVE No new complains, denies further diarrhea No abd pain, no fever or chills. SOB stable. No sig cough Wants to get off vent and go to floor Slept well, tolerating TF   SIGNIFICANT EVENTS    BP 95/72   Pulse 81   Temp 99.3 F (37.4 C) (Axillary)   Resp 16   Ht 5' 2.01" (1.575 m)   Wt 56.5 kg   SpO2 98%   BMI 22.78 kg/m    REVIEW OF SYSTEMS   PHYSICAL EXAMINATION:  GENERAL:critically ill appearing, no resp distress HEAD: Normocephalic, atraumatic.  EYES: Pupils equal, round, reactive to light.  No scleral icterus.  MOUTH: Moist mucosal membrane. NECK: Supple. No thyromegaly. No nodules. No JVD.  PULMONARY: few rhonchi, No wheezing CARDIOVASCULAR: S1 and S2. Regular rate and rhythm. No murmurs, rubs, or gallops.  GASTROINTESTINAL: Soft, nontender, -distended. No masses. Positive bowel sounds. No hepatosplenomegaly.  MUSCULOSKELETAL: No swelling, clubbing, or edema.  NEUROLOGIC: awalke and alert ,follows commands, non focalSKIN:intact,warm,dry  INTAKE/OUTPUT  Intake/Output Summary (Last 24 hours) at 07/15/2020 0847 Last data filed at 07/14/2020 1830 Gross per 24 hour  Intake 50 ml  Output 400 ml  Net -350 ml    LABS  CBC Recent Labs  Lab 07/13/20 1104 07/14/20 0340 07/15/20 0433  WBC 13.1* 12.4* 9.5  HGB 7.9* 7.4* 6.5*  HCT 27.4* 23.8* 21.3*  PLT 427* 382 319   Coag's Recent Labs  Lab 07/13/20 1104  INR 1.3*   BMET Recent Labs  Lab 07/13/20 1104 07/13/20 1507 07/15/20 0433  NA 138 136 138  K 5.3* 5.3* 3.7  CL 93* 91* 94*  CO2 37* 33* 33*  BUN 19 23 26*  CREATININE 0.79 0.90 1.34*  GLUCOSE 256* 85 93   Electrolytes Recent Labs  Lab 07/10/20 0609 07/12/20 0535 07/13/20 1104 07/13/20 1507 07/15/20 0433  CALCIUM 8.9 8.9 9.1 9.0 8.9  MG 1.9 1.7 1.8  --  1.9  PHOS 3.8 3.3  --   --  5.5*   Sepsis  Markers Recent Labs  Lab 07/13/20 1104 07/13/20 1507 07/13/20 1708 07/14/20 0340 07/15/20 0433  LATICACIDVEN 1.8 2.2* 1.4  --   --   PROCALCITON  --  0.13  --  0.28 0.18   ABG Recent Labs  Lab 07/13/20 0929 07/13/20 1403  PHART 7.00* 7.52*  PCO2ART <19.0* 46  PO2ART 38* 114*   Liver Enzymes Recent Labs  Lab 07/13/20 1104  AST 31  ALT 23  ALKPHOS 19*  BILITOT 0.3  ALBUMIN 2.9*   Cardiac Enzymes No results for input(s): TROPONINI, PROBNP in the last 168 hours. Glucose Recent Labs  Lab 07/14/20 1216 07/14/20 1712 07/14/20 2015 07/15/20 0003 07/15/20 0426 07/15/20 0720  GLUCAP 72 91 86 89 80 90     Recent Results (from the past 240 hour(s))  Culture, respiratory (non-expectorated)     Status: None (Preliminary result)   Collection Time: 07/13/20  4:20 PM   Specimen: Tracheal Aspirate; Respiratory  Result Value Ref Range Status   Specimen Description   Final    TRACHEAL ASPIRATE Performed at [facility], [address], [place], [place] [address]    Special Requests   Final    NONE Performed at [facility], [address] [place], [place] [address]    Gram Stain   Final    NO WBC SEEN ABUNDANT GRAM POSITIVE  RODS Performed at [facility], [place] [address] [place], [place] [address]    Culture PENDING  Incomplete   Report Status PENDING  Incomplete    MEDICATIONS   Current Facility-Administered Medications:  .  0.9 %  sodium chloride infusion (Manually program via Guardrails IV Fluids), , Intravenous, Once, Rust-Chester, Huel Cote, NP, Held at 06/18/20 [phone] .  0.9 %  sodium chloride infusion (Manually program via Guardrails IV Fluids), , Intravenous, Once, Rust-Chester, Britton L, NP .  0.9 %  sodium chloride infusion, 250 mL, Intravenous, Continuous, Duffy Bruce, MD, Stopped at 06/25/20 1842 .  0.9 %  sodium chloride infusion, 250 mL, Intravenous, PRN, Flora Lipps, MD .  acetaminophen (TYLENOL) 160 MG/5ML solution  650 mg, 650 mg, Per Tube, Q4H PRN, Flora Lipps, MD, 650 mg at 07/13/20 2221 .  apixaban (ELIQUIS) tablet 5 mg, 5 mg, Per Tube, BID, Hall, Scott A, RPH, 5 mg at 07/14/20 2152 .  arformoterol (BROVANA) nebulizer solution 15 mcg, 15 mcg, Nebulization, BID, Candee Furbish, MD, 15 mcg at 07/15/20 0801 .  chlorhexidine gluconate (MEDLINE KIT) (PERIDEX) 0.12 % solution 15 mL, 15 mL, Mouth Rinse, BID, Kasa, Kurian, MD, 15 mL at 07/13/20 1934 .  chlorhexidine gluconate (MEDLINE KIT) (PERIDEX) 0.12 % solution 15 mL, 15 mL, Mouth Rinse, BID, Sharen Hones, MD, 15 mL at 07/14/20 2016 .  Chlorhexidine Gluconate Cloth 2 % PADS 6 each, 6 each, Topical, Daily, Flora Lipps, MD, 6 each at 07/14/20 1103 .  clonazePAM (KLONOPIN) tablet 1 mg, 1 mg, Per Tube, BID, Candee Furbish, MD, 1 mg at 07/14/20 2152 .  diazepam (VALIUM) tablet 5 mg, 5 mg, Per Tube, Q6H PRN, Awilda Bill, NP, 5 mg at 07/14/20 1114 .  feeding supplement (OSMOLITE 1.2 CAL) liquid 237 mL, 237 mL, Per Tube, 6 X Daily, Rosine Door, MD, Held at 07/15/20 [phone] .  feeding supplement (PROSource TF) liquid 45 mL, 45 mL, Per Tube, Daily, Rosine Door, MD, 45 mL at 07/14/20 1103 .  fentaNYL (DURAGESIC) 50 MCG/HR 1 patch, 1 patch, Transdermal, Q72H, Tyler Pita, MD, 1 patch at 07/14/20 1219 .  free water 60 mL, 60 mL, Per Tube, 6 X Daily, Val Riles, MD, 60 mL at 07/14/20 1720 .  gabapentin (NEURONTIN) capsule 300 mg, 300 mg, Per Tube, TID, Tyler Pita, MD, 300 mg at 07/14/20 2152 .  glycopyrrolate (ROBINUL) tablet 1 mg, 1 mg, Per Tube, BID, Ottie Glazier, MD, 1 mg at 07/14/20 2153 .  insulin aspart (novoLOG) injection 0-6 Units, 0-6 Units, Subcutaneous, Q4H, Dallie Piles, RPH, 1 Units at 07/14/20 0017 .  iohexol (OMNIPAQUE) 350 MG/ML injection 75 mL, 75 mL, Intravenous, Once PRN, Sharen Hones, MD .  ipratropium-albuterol (DUONEB) 0.5-2.5 (3) MG/3ML nebulizer solution 3 mL, 3 mL, Nebulization, Q6H PRN, Awilda Bill, NP, 3 mL  at 07/13/20 0611 .  iron sucrose (VENOFER) 300 mg in sodium chloride 0.9 % 250 mL IVPB, 300 mg, Intravenous, Once, Sharen Hones, MD .  labetalol (NORMODYNE) injection 10 mg, 10 mg, Intravenous, Q4H PRN, Tyna Jaksch, MD, 10 mg at 06/28/20 9470 .  MEDLINE mouth rinse, 15 mL, Mouth Rinse, 10 times per day, Sharen Hones, MD, 15 mL at 07/15/20 0537 .  metoprolol tartrate (LOPRESSOR) injection 5 mg, 5 mg, Intravenous, Q3H PRN, Ottie Glazier, MD, 5 mg at 06/17/20 2006 .  metoprolol tartrate (LOPRESSOR) tablet 50 mg, 50 mg, Per Tube, BID, Candee Furbish, MD, 50 mg at 07/14/20 2152 .  naphazoline-glycerin (CLEAR EYES REDNESS) ophth solution 1-2 drop, 1-2 drop, Both Eyes, QID PRN, Sharen Hones, MD, 2 drop at 07/12/20 1040 .  ondansetron (ZOFRAN) injection 4 mg, 4 mg, Intravenous, Q6H PRN, Flora Lipps, MD, 4 mg at 07/14/20 1000 .  oxyCODONE (Oxy IR/ROXICODONE) immediate release tablet 10 mg, 10 mg, Per Tube, Q4H PRN, Candee Furbish, MD, 10 mg at 07/01/20 1625 .  pantoprazole (PROTONIX) injection 40 mg, 40 mg, Intravenous, QHS, Dallie Piles, RPH, 40 mg at 07/14/20 2149 .  piperacillin-tazobactam (ZOSYN) IVPB 3.375 g, 3.375 g, Intravenous, Q8H, Rosine Door, MD, Last Rate: 12.5 mL/hr at 07/15/20 0533, 3.375 g at 07/15/20 0533 .  QUEtiapine (SEROQUEL) tablet 25 mg, 25 mg, Per Tube, QHS, Tyler Pita, MD, 25 mg at 07/14/20 2152 .  sodium chloride flush (NS) 0.9 % injection 10-40 mL, 10-40 mL, Intracatheter, Q12H, Tyler Pita, MD, 10 mL at 07/14/20 2149 .  sodium chloride flush (NS) 0.9 % injection 10-40 mL, 10-40 mL, Intracatheter, PRN, Tyler Pita, MD, 10 mL at 06/30/20 2104 .  sodium chloride flush (NS) 0.9 % injection 3 mL, 3 mL, Intravenous, Q12H, Kasa, Kurian, MD, 3 mL at 07/14/20 2149 .  sodium chloride flush (NS) 0.9 % injection 3 mL, 3 mL, Intravenous, PRN, Flora Lipps, MD      Indwelling Urinary Catheter continued, requirement due to   Reason to continue  Indwelling Urinary Catheter for strict Intake/Output monitoring for hemodynamic instability   Central Line continued, requirement due to   Reason to continue [employer] Monitoring of central venous pressure or other hemodynamic parameters   Ventilator continued, requirement due to, resp failure    Ventilator Sedation RASS 0 to -2     ASSESSMENT AND PLAN SYNOPSIS   1. Acute on chronic hypercapnic resp failure , Acute onset no obvious reason ? Mucous plug but notmuch suctionedfrom the tracheostomy. It is possible that her CO2 may have built up over the last day or so.CT scanning of the head does not show any significant pathology to explain her change in mental status. ? Aspiration pneumonia, started on Zosyn, procal trending down was not frequently elevated to begin with, elevated wbc count has normalized and has no significant fever . ETS c/s remains negative Try TC today , follow ABG,s   2.AcuteEncephalopathy.resolved  Patient had a sudden onset of slurred speech and altered mental status started [id number] per report from the floor and MD.Blood sugar was normal. CT headunremarkable for any acute strokes or bleeding to explain her sudden onset of change in mental status. MS has normalized without any focal deficit.  3. Diarrhea) 1/21/) has been on abx, ? C diff,, no further diarrhea, follow for now. ID didn't recommend C diff testing at present ass he got miralexon 1/20  4.COPD apperas stabale without wheezing will cont nebs, full vent support. No need of IV steroids   5.Hypotension r/o sepsis , check blod, urine and ETS cultures, check pro calcitonin. Hold all BP meds for now BP stable not on any pressors  6.Anxiety cont klonopin  7. Paroxysmal A FIB cont apixaban , NSR at present  8. Nutrition on tube feeds  9 Anemia, no overt bleeding, H&H dropped to 6.5/21.3, check heme occult, may need blood, check iron studies in am   DVT  prophylaxis:Eliquis Code Status:Full Critical Care Time devoted to patient care services described in this note is 31  minutes.   Overall, patient is critically ill, prognosis is guarded.  Patient with Multiorgan  failure and at high risk for cardiac arrest and death.   Rosine Door, MD  08-14-20 8:47 AM [facility]

## 2020-07-16 DIAGNOSIS — I480 Paroxysmal atrial fibrillation: Secondary | ICD-10-CM | POA: Diagnosis not present

## 2020-07-16 DIAGNOSIS — J9621 Acute and chronic respiratory failure with hypoxia: Secondary | ICD-10-CM | POA: Diagnosis not present

## 2020-07-16 DIAGNOSIS — G9341 Metabolic encephalopathy: Secondary | ICD-10-CM | POA: Diagnosis not present

## 2020-07-16 DIAGNOSIS — D638 Anemia in other chronic diseases classified elsewhere: Secondary | ICD-10-CM | POA: Diagnosis not present

## 2020-07-16 LAB — BPAM RBC
Blood Product Expiration Date: 202202172359
ISSUE DATE / TIME: 202201221500
Unit Type and Rh: 7300

## 2020-07-16 LAB — IRON AND TIBC
Iron: 16 ug/dL — ABNORMAL LOW (ref 28–170)
Saturation Ratios: 7 % — ABNORMAL LOW (ref 10.4–31.8)
TIBC: 231 ug/dL — ABNORMAL LOW (ref 250–450)
UIBC: 215 ug/dL

## 2020-07-16 LAB — CBC
HCT: 25.6 % — ABNORMAL LOW (ref 36.0–46.0)
Hemoglobin: 7.7 g/dL — ABNORMAL LOW (ref 12.0–15.0)
MCH: 28.5 pg (ref 26.0–34.0)
MCHC: 30.1 g/dL (ref 30.0–36.0)
MCV: 94.8 fL (ref 80.0–100.0)
Platelets: 291 10*3/uL (ref 150–400)
RBC: 2.7 MIL/uL — ABNORMAL LOW (ref 3.87–5.11)
RDW: 17.5 % — ABNORMAL HIGH (ref 11.5–15.5)
WBC: 6.6 10*3/uL (ref 4.0–10.5)
nRBC: 0.5 % — ABNORMAL HIGH (ref 0.0–0.2)

## 2020-07-16 LAB — TYPE AND SCREEN
ABO/RH(D): B POS
Antibody Screen: NEGATIVE
Unit division: 0

## 2020-07-16 LAB — RENAL FUNCTION PANEL
Albumin: 2.5 g/dL — ABNORMAL LOW (ref 3.5–5.0)
Anion gap: 13 (ref 5–15)
BUN: 23 mg/dL (ref 8–23)
CO2: 34 mmol/L — ABNORMAL HIGH (ref 22–32)
Calcium: 8.7 mg/dL — ABNORMAL LOW (ref 8.9–10.3)
Chloride: 96 mmol/L — ABNORMAL LOW (ref 98–111)
Creatinine, Ser: 0.91 mg/dL (ref 0.44–1.00)
GFR, Estimated: >60 mL/min (ref >60)
Glucose, Bld: 91 mg/dL (ref 70–99)
Phosphorus: 5.8 mg/dL — ABNORMAL HIGH (ref 2.5–4.6)
Potassium: 3.5 mmol/L (ref 3.5–5.1)
Sodium: 143 mmol/L (ref 135–145)

## 2020-07-16 LAB — BLOOD GAS, ARTERIAL
Acid-Base Excess: 11.5 mmol/L — ABNORMAL HIGH (ref 0.0–2.0)
Bicarbonate: 39.2 mmol/L — ABNORMAL HIGH (ref 20.0–28.0)
FIO2: 0.4
O2 Saturation: 95.9 %
Patient temperature: 37
pCO2 arterial: 71 mmHg (ref 32.0–48.0)
pH, Arterial: 7.35 (ref 7.350–7.450)
pO2, Arterial: 85 mmHg (ref 83.0–108.0)

## 2020-07-16 LAB — GLUCOSE, CAPILLARY
Glucose-Capillary: 83 mg/dL (ref 70–99)
Glucose-Capillary: 78 mg/dL (ref 70–99)
Glucose-Capillary: 102 mg/dL — ABNORMAL HIGH (ref 70–99)
Glucose-Capillary: 75 mg/dL (ref 70–99)
Glucose-Capillary: 111 mg/dL — ABNORMAL HIGH (ref 70–99)

## 2020-07-16 LAB — FERRITIN: Ferritin: 415 ng/mL — ABNORMAL HIGH (ref 11–307)

## 2020-07-16 LAB — MAGNESIUM: Magnesium: 2.1 mg/dL (ref 1.7–2.4)

## 2020-07-16 MED ORDER — ENSURE ENLIVE PO LIQD
237.0000 mL | Freq: Four times a day (QID) | ORAL | Status: DC
  Administered 2020-07-16 – 2020-07-17 (×2): 237 mL

## 2020-07-16 MED ORDER — ENSURE ENLIVE PO LIQD
237.0000 mL | Freq: Four times a day (QID) | ORAL | Status: DC
  Administered 2020-07-16 (×2): 237 mL via ORAL

## 2020-07-16 NOTE — Progress Notes (Signed)
CRITICAL CARE NOTE Acute on chronic hypercapnic resp failure COPD Pneumonia Diarrhea   CC  follow up respiratory failure  SUBJECTIVE No new complains, denies further diarrhea No abd pain, no fever or chills. SOB stable. No sig cough Tolerated TC x 24 hrs well without resp distress  Slept well, tolerating TF but has diarrhea    SIGNIFICANT EVENTS    BP 110/75   Pulse 89   Temp 99.1 F (37.3 C) (Axillary)   Resp (!) 30   Ht 5' 2.01" (1.575 m)   Wt 56.4 kg   SpO2 98%   BMI 22.74 kg/m    REVIEW OF SYSTEMS  PATIENT IS UNABLE TO PROVIDE COMPLETE REVIEW OF SYSTEMS DUE TO SEVERE CRITICAL ILLNESS   PHYSICAL EXAMINATION:  GENERAL:critically ill appearing, no resp distress HEAD: Normocephalic, atraumatic.  EYES: Pupils equal, round, reactive to light.  No scleral icterus.  MOUTH: Moist mucosal membrane. NECK: Supple. No thyromegaly. No nodules. No JVD.  PULMONARY: few rhonchi, NO wheezing CARDIOVASCULAR: S1 and S2. Regular rate and rhythm. No murmurs, rubs, or gallops.  GASTROINTESTINAL: Soft, nontender, -distended. No masses. Positive bowel sounds. No hepatosplenomegaly.  MUSCULOSKELETAL: No swelling, clubbing, or edema.  NEUROLOGIC: awake and alert follow commands,non focal  SKIN:intact,warm,dry  INTAKE/OUTPUT  Intake/Output Summary (Last 24 hours) at 07/16/2020 1253 Last data filed at 07/16/2020 1016 Gross per 24 hour  Intake 947.36 ml  Output 450 ml  Net 497.36 ml    LABS  CBC Recent Labs  Lab 07/14/20 0340 07/15/20 0433 07/15/20 2042 07/16/20 0342  WBC 12.4* 9.5  --  6.6  HGB 7.4* 6.5* 8.2* 7.7*  HCT 23.8* 21.3* 27.0* 25.6*  PLT 382 319  --  291   Coag's Recent Labs  Lab 07/13/20 1104  INR 1.3*   BMET Recent Labs  Lab 07/13/20 1507 07/15/20 0433 07/16/20 0342  NA 136 138 143  K 5.3* 3.7 3.5  CL 91* 94* 96*  CO2 33* 33* 34*  BUN 23 26* 23  CREATININE 0.90 1.34* 0.91  GLUCOSE 85 93 91   Electrolytes Recent Labs  Lab  07/12/20 0535 07/13/20 1104 07/13/20 1507 07/15/20 0433 07/16/20 0342  CALCIUM 8.9 9.1 9.0 8.9 8.7*  MG 1.7 1.8  --  1.9 2.1  PHOS 3.3  --   --  5.5* 5.8*   Sepsis Markers Recent Labs  Lab 07/13/20 1104 07/13/20 1507 07/13/20 1708 07/14/20 0340 07/15/20 0433  LATICACIDVEN 1.8 2.2* 1.4  --   --   PROCALCITON  --  0.13  --  0.28 0.18   ABG Recent Labs  Lab 07/13/20 1403 07/15/20 1408 07/16/20 0500  PHART 7.52* 7.39 7.35  PCO2ART 46 61* 71*  PO2ART 114* 109* 85   Liver Enzymes Recent Labs  Lab 07/13/20 1104 07/16/20 0342  AST 31  --   ALT 23  --   ALKPHOS 19*  --   BILITOT 0.3  --   ALBUMIN 2.9* 2.5*   Cardiac Enzymes No results for input(s): TROPONINI, PROBNP in the last 168 hours. Glucose Recent Labs  Lab 07/15/20 1627 07/15/20 1922 07/15/20 2344 07/16/20 0323 07/16/20 1003 07/16/20 1139  GLUCAP 110* 73 86 83 78 102*     Recent Results (from the past 240 hour(s))  Culture, respiratory (non-expectorated)     Status: None (Preliminary result)   Collection Time: 07/13/20  4:20 PM   Specimen: Tracheal Aspirate; Respiratory  Result Value Ref Range Status   Specimen Description   Final    TRACHEAL ASPIRATE  Performed at [facility], [address], [place], [place] [address]    Special Requests   Final    NONE Performed at [facility], [place], [place] [address]    Gram Stain   Final    NO WBC SEEN ABUNDANT GRAM POSITIVE RODS Performed at [facility], [place] [address] [place], [place] [address]    Culture FEW ENTEROBACTER AEROGENES  Final   Report Status PENDING  Incomplete    MEDICATIONS   Current Facility-Administered Medications:  .  0.9 %  sodium chloride infusion (Manually program via Guardrails IV Fluids), , Intravenous, Once, Rust-Chester, Britton L, NP .  0.9 %  sodium chloride infusion, 250 mL, Intravenous, Continuous, Duffy Bruce, MD, Stopped at 06/25/20 1842 .  acetaminophen  (TYLENOL) 160 MG/5ML solution 650 mg, 650 mg, Per Tube, Q4H PRN, Flora Lipps, MD, 650 mg at 07/16/20 1238 .  apixaban (ELIQUIS) tablet 5 mg, 5 mg, Per Tube, BID, Hall, Scott A, RPH, 5 mg at 07/16/20 0958 .  arformoterol (BROVANA) nebulizer solution 15 mcg, 15 mcg, Nebulization, BID, Candee Furbish, MD, 15 mcg at 07/16/20 [phone] .  chlorhexidine gluconate (MEDLINE KIT) (PERIDEX) 0.12 % solution 15 mL, 15 mL, Mouth Rinse, BID, Sharen Hones, MD, 15 mL at 07/15/20 1952 .  Chlorhexidine Gluconate Cloth 2 % PADS 6 each, 6 each, Topical, Daily, Flora Lipps, MD, 6 each at 07/15/20 1002 .  clonazePAM (KLONOPIN) tablet 1 mg, 1 mg, Per Tube, BID, Candee Furbish, MD, 1 mg at 07/16/20 0959 .  diazepam (VALIUM) tablet 5 mg, 5 mg, Per Tube, Q6H PRN, Awilda Bill, NP, 5 mg at 07/16/20 1238 .  feeding supplement (ENSURE ENLIVE / ENSURE PLUS) liquid 237 mL, 237 mL, Oral, QID, Rosine Door, MD, 237 mL at 07/16/20 1222 .  feeding supplement (OSMOLITE 1.2 CAL) liquid 237 mL, 237 mL, Per Tube, 6 X Daily, Rosine Door, MD, 237 mL at 07/16/20 0518 .  feeding supplement (PROSource TF) liquid 45 mL, 45 mL, Per Tube, Daily, Rosine Door, MD, 45 mL at 07/16/20 0958 .  fentaNYL (DURAGESIC) 50 MCG/HR 1 patch, 1 patch, Transdermal, Q72H, Tyler Pita, MD, 1 patch at 07/14/20 1219 .  free water 60 mL, 60 mL, Per Tube, 6 X Daily, Val Riles, MD, 60 mL at 07/16/20 1223 .  gabapentin (NEURONTIN) capsule 300 mg, 300 mg, Per Tube, TID, Tyler Pita, MD, 300 mg at 07/16/20 0959 .  glycopyrrolate (ROBINUL) tablet 1 mg, 1 mg, Per Tube, BID, Ottie Glazier, MD, 1 mg at 07/16/20 0959 .  insulin aspart (novoLOG) injection 0-6 Units, 0-6 Units, Subcutaneous, Q4H, Dallie Piles, RPH, 1 Units at 07/14/20 0017 .  iohexol (OMNIPAQUE) 350 MG/ML injection 75 mL, 75 mL, Intravenous, Once PRN, Sharen Hones, MD .  ipratropium-albuterol (DUONEB) 0.5-2.5 (3) MG/3ML nebulizer solution 3 mL, 3 mL, Nebulization, Q6H PRN,  Awilda Bill, NP, 3 mL at 07/13/20 0611 .  labetalol (NORMODYNE) injection 10 mg, 10 mg, Intravenous, Q4H PRN, Tyna Jaksch, MD, 10 mg at 06/28/20 0093 .  MEDLINE mouth rinse, 15 mL, Mouth Rinse, 10 times per day, Sharen Hones, MD, 15 mL at 07/16/20 1226 .  metoprolol tartrate (LOPRESSOR) injection 5 mg, 5 mg, Intravenous, Q3H PRN, Ottie Glazier, MD, 5 mg at 06/17/20 2006 .  metoprolol tartrate (LOPRESSOR) tablet 50 mg, 50 mg, Per Tube, BID, Candee Furbish, MD, 50 mg at 07/16/20 0959 .  naphazoline-glycerin (CLEAR EYES REDNESS) ophth solution  1-2 drop, 1-2 drop, Both Eyes, QID PRN, Sharen Hones, MD, 2 drop at 07/12/20 1040 .  ondansetron (ZOFRAN) injection 4 mg, 4 mg, Intravenous, Q6H PRN, Flora Lipps, MD, 4 mg at 07/14/20 1000 .  oxyCODONE (Oxy IR/ROXICODONE) immediate release tablet 10 mg, 10 mg, Per Tube, Q4H PRN, Candee Furbish, MD, 10 mg at 07/01/20 1625 .  pantoprazole (PROTONIX) injection 40 mg, 40 mg, Intravenous, QHS, Dallie Piles, RPH, 40 mg at 07/15/20 2121 .  piperacillin-tazobactam (ZOSYN) IVPB 3.375 g, 3.375 g, Intravenous, Q8H, Rosine Door, MD, Stopped at 07-17-20 [phone] .  QUEtiapine (SEROQUEL) tablet 25 mg, 25 mg, Per Tube, QHS, Tyler Pita, MD, 25 mg at 07/15/20 2121 .  sodium chloride flush (NS) 0.9 % injection 10-40 mL, 10-40 mL, Intracatheter, Q12H, Tyler Pita, MD, 10 mL at 07-17-2020 1016 .  sodium chloride flush (NS) 0.9 % injection 10-40 mL, 10-40 mL, Intracatheter, PRN, Tyler Pita, MD, 10 mL at 06/30/20 2104      Indwelling Urinary Catheter continued, requirement due to   Reason to continue Indwelling Urinary Catheter for strict Intake/Output monitoring for hemodynamic instability   Central Line continued, requirement due to   Reason to continue [employer] Monitoring of central venous pressure or other hemodynamic parameters   Ventilator continued, requirement due to, resp failure    Ventilator Sedation RASS 0 to -2      ASSESSMENT AND PLAN SYNOPSIS 1. Acute on chronic hypercapnic resp failure , Acute onset no obvious reason ? Mucous plug but notmuch suctionedfrom the tracheostomy. It is possible that her CO2 may have built up over the last day or so.CT scanning of the head does not show any significant pathology to explain her change in mental status. ? Aspiration pneumonia, started on Zosyn, procal trending down was not frequently elevated to begin with, elevated wbc count has normalized and has no significant fever . ETS c/s remains negative Tolerated  TC x 24 with ABG showing 7.35/71/84 .PCo2 up from 61 when on vent yesterday am. Will cont TC and repeat ABG in am   2.AcuteEncephalopathy.resolved Patient had a sudden onset of slurred speech and altered mental status started at926amas per report from the floor and MD.Blood sugar was normal. CT headunremarkable for any acute strokes or bleeding to explain her sudden onset of change in mental status. MS has normalized without any focal deficit.  3. Diarrhea) 1/21/) has been on abx, ? C diff but without fever leukocytosis or abdominal pain, follow for now. ID didn't recommend C diff testing at present .  4.COPD appearsstabale without wheezing will cont nebs. No need of IV steroids   5.Hypotension r/o sepsis , check blood, urine and ETS cultures, check pro calcitonin. Hold all BP meds for now BP stable not on any pressors  6.Anxiety cont klonopin  7. Paroxysmal A FIB cont apixaban , NSR at present  8. Nutrition on tube feeds  9 Anemia, no overt bleeding, H&H dropped to 6.5/21.3, check heme occult, may need blood, check iron studies . Iron sat is low but has a high ferritin   DVT prophylaxis:Eliquis Code Status:Full   Transfer to stepdown under TR[facility]    Overall, patient is critically ill, prognosis is guarded.  Patient with Multiorgan failure and at high risk for cardiac arrest and death.   Rosine Door, MD   2020-07-17 12:53 PM [facility]

## 2020-07-16 NOTE — Progress Notes (Signed)
Neuro: stable at baseline Resp: stable on trach collar CV: afebrile, vital signs stable, well perfused GIGU: some loose BMs, complains the Osmolite gives her GI discomfort and has only had the diarrhea when fed that, BMs improving with the holding of Osmolite, no emesis, tolerated ensure well with no discomfort or diarrhea Skin: clean and dry, skin irritation in groin and buttock improving Social: daughter in law visited today.

## 2020-07-16 NOTE — Evaluation (Signed)
Physical Therapy RE-Evaluation Patient Details Name: Virginia [name] MRN: [id number] DOB: 1950-12-01 Today's Date: 07/16/2020   History of Present Illness  70 yo female with severe COPD admitted with acute on chronic hypoxic hypercapnic respiratory failure and acute encephalopathy secondary to narcotic use and AECOPD requiring mechanical intubation. Initially intubated from 12/14-12/16 and then again from 12/18-12/23. Now with + trach collar. Recent hospitalization from 12/10-12/12/21. RR secondary to slurred speech and AMS. Transferred to CCU on 1/20 on full vent support. RE-evaluation performed this date as medical status has improved.  Clinical Impression  Re-evaluation performed this date. Pt is a pleasant 70 year old female who was admitted for hypoxic hypercapnic respiratory failure. Hospital stay complicated due to vent and trach use. Pt performs bed mobility, transfers, and ambulation with min assist and HHA. Anticipate need for RW to improve balance/endurance. All mobility performed on 8L of O2 via trach with sats at 98% and RR at 36/min. Pt demonstrates deficits with strength/mobility/endurance. Continues to be very motivated to participate. Would benefit from skilled PT to address above deficits and promote optimal return to PLOF; recommend transition to STR upon discharge from acute hospitalization.     Follow Up Recommendations SNF    Equipment Recommendations  None recommended by PT    Recommendations for Other Services       Precautions / Restrictions Precautions Precautions: Fall Precaution Comments: trach Restrictions Other Position/Activity Restrictions: watch sats      Mobility  Bed Mobility Overal bed mobility: Needs Assistance Bed Mobility: Supine to Sit     Supine to sit: Min assist Sit to supine: Min assist   General bed mobility comments: needs slight assist for trunk support. ONce seated, upright posture noted.    Transfers Overall transfer level:  Needs assistance Equipment used: 1 person hand held assist Transfers: Sit to/from Stand Sit to Stand: Min assist         General transfer comment: bracing against bed. UPright posture  Ambulation/Gait Ambulation/Gait assistance: Min assist Gait Distance (Feet): 24 Feet Assistive device: 1 person hand held assist Gait Pattern/deviations: Step-to pattern     General Gait Details: able to side step up/down bed several times. Very slow, RW not available. Needs bilat HHA. O2 monitored throughout, 98% on 8L of O2 via trach  Stairs            Wheelchair Mobility    Modified Rankin (Stroke Patients Only)       Balance Overall balance assessment: Needs assistance Sitting-balance support: Single extremity supported;Feet supported Sitting balance-Leahy Scale: Good     Standing balance support: Bilateral upper extremity supported Standing balance-Leahy Scale: Fair                               Pertinent Vitals/Pain Pain Assessment: No/denies pain Pain Descriptors / Indicators: Dull;Grimacing;Discomfort Pain Intervention(s): Limited activity within patient's tolerance    Home Living Family/patient expects to be discharged to:: Private residence Living Arrangements: Alone Available Help at Discharge: Family;Available PRN/intermittently Type of Home: Apartment Home Access: Level entry     Home Layout: One level Home Equipment: Emergency planning/management officer - 4 wheels      Prior Function Level of Independence: Independent with assistive device(s)         Comments: MOD I using RW including driving     Hand Dominance   Dominant Hand: Right    Extremity/Trunk Assessment   Upper Extremity Assessment Upper Extremity Assessment: Generalized weakness (R  UE grossly 3/5; L UE grossly 4/5)    Lower Extremity Assessment Lower Extremity Assessment: Generalized weakness (B LE grossly 4/5)       Communication   Communication: Tracheostomy  Cognition  Arousal/Alertness: Awake/alert Behavior During Therapy: WFL for tasks assessed/performed Overall Cognitive Status: Within Functional Limits for tasks assessed                                 General Comments: able to mouth words clearly. calm and cooperative      General Comments      Exercises Other Exercises Other Exercises: supine ther-ex performed x B LE including quad sets, SLRs, hip abd/add, and shoulder press. All ther-ex performed x 10 reps with cga   Assessment/Plan    PT Assessment Patient needs continued PT services  PT Problem List Decreased activity tolerance;Decreased balance;Cardiopulmonary status limiting activity;Decreased strength;Decreased knowledge of use of DME;Decreased mobility       PT Treatment Interventions Gait training;Therapeutic exercise;Balance training;Therapeutic activities;DME instruction;Functional mobility training;Patient/family education;Neuromuscular re-education;Stair training    PT Goals (Current goals can be found in the Care Plan section)  Acute Rehab PT Goals Patient Stated Goal: To return home safely PT Goal Formulation: With patient Time For Goal Achievement: 07/30/20 Potential to Achieve Goals: Good    Frequency Min 2X/week   Barriers to discharge        Co-evaluation               AM-PAC PT "6 Clicks" Mobility  Outcome Measure Help needed turning from your back to your side while in a flat bed without using bedrails?: A Little Help needed moving from lying on your back to sitting on the side of a flat bed without using bedrails?: A Little Help needed moving to and from a bed to a chair (including a wheelchair)?: A Lot Help needed standing up from a chair using your arms (e.g., wheelchair or bedside chair)?: A Little Help needed to walk in hospital room?: A Lot Help needed climbing 3-5 steps with a railing? : Total 6 Click Score: 14    End of Session Equipment Utilized During Treatment: Oxygen;Gait  belt Activity Tolerance: Patient tolerated treatment well Patient left: in bed Nurse Communication: Mobility status PT Visit Diagnosis: Muscle weakness (generalized) (M62.81);Difficulty in walking, not elsewhere classified (R26.2)    Time: 3976-7341 PT Time Calculation (min) (ACUTE ONLY): 27 min   Charges:   PT Evaluation $PT Re-evaluation: 1 Re-eval PT Treatments $Therapeutic Exercise: 8-22 mins        Elizabeth Palau, PT, DPT [phone]   Jacklin Zwick 07/16/2020, 3:08 PM

## 2020-07-17 DIAGNOSIS — G9341 Metabolic encephalopathy: Secondary | ICD-10-CM | POA: Diagnosis not present

## 2020-07-17 DIAGNOSIS — G9340 Encephalopathy, unspecified: Secondary | ICD-10-CM | POA: Diagnosis not present

## 2020-07-17 DIAGNOSIS — J9622 Acute and chronic respiratory failure with hypercapnia: Secondary | ICD-10-CM | POA: Diagnosis not present

## 2020-07-17 DIAGNOSIS — R197 Diarrhea, unspecified: Secondary | ICD-10-CM | POA: Diagnosis not present

## 2020-07-17 DIAGNOSIS — J690 Pneumonitis due to inhalation of food and vomit: Secondary | ICD-10-CM | POA: Diagnosis not present

## 2020-07-17 DIAGNOSIS — J9621 Acute and chronic respiratory failure with hypoxia: Secondary | ICD-10-CM | POA: Diagnosis not present

## 2020-07-17 DIAGNOSIS — J441 Chronic obstructive pulmonary disease with (acute) exacerbation: Secondary | ICD-10-CM | POA: Diagnosis not present

## 2020-07-17 DIAGNOSIS — D649 Anemia, unspecified: Secondary | ICD-10-CM

## 2020-07-17 LAB — CULTURE, RESPIRATORY W GRAM STAIN: Gram Stain: NONE SEEN

## 2020-07-17 LAB — RENAL FUNCTION PANEL
Albumin: 2.7 g/dL — ABNORMAL LOW (ref 3.5–5.0)
Anion gap: 11 (ref 5–15)
BUN: 19 mg/dL (ref 8–23)
CO2: 36 mmol/L — ABNORMAL HIGH (ref 22–32)
Calcium: 8.8 mg/dL — ABNORMAL LOW (ref 8.9–10.3)
Chloride: 96 mmol/L — ABNORMAL LOW (ref 98–111)
Creatinine, Ser: 0.68 mg/dL (ref 0.44–1.00)
GFR, Estimated: >60 mL/min (ref >60)
Glucose, Bld: 90 mg/dL (ref 70–99)
Phosphorus: 3.1 mg/dL (ref 2.5–4.6)
Potassium: 4 mmol/L (ref 3.5–5.1)
Sodium: 143 mmol/L (ref 135–145)

## 2020-07-17 LAB — BLOOD GAS, ARTERIAL
Acid-Base Excess: 15.7 mmol/L — ABNORMAL HIGH (ref 0.0–2.0)
Bicarbonate: 41.5 mmol/L — ABNORMAL HIGH (ref 20.0–28.0)
FIO2: 0.4
O2 Saturation: 98.6 %
Patient temperature: 37
pCO2 arterial: 57 mmHg — ABNORMAL HIGH (ref 32.0–48.0)
pH, Arterial: 7.47 — ABNORMAL HIGH (ref 7.350–7.450)
pO2, Arterial: 110 mmHg — ABNORMAL HIGH (ref 83.0–108.0)

## 2020-07-17 LAB — CBC
HCT: 28.3 % — ABNORMAL LOW (ref 36.0–46.0)
Hemoglobin: 8.5 g/dL — ABNORMAL LOW (ref 12.0–15.0)
MCH: 28.8 pg (ref 26.0–34.0)
MCHC: 30 g/dL (ref 30.0–36.0)
MCV: 95.9 fL (ref 80.0–100.0)
Platelets: 317 10*3/uL (ref 150–400)
RBC: 2.95 MIL/uL — ABNORMAL LOW (ref 3.87–5.11)
RDW: 16.9 % — ABNORMAL HIGH (ref 11.5–15.5)
WBC: 9.1 10*3/uL (ref 4.0–10.5)
nRBC: 0 % (ref 0.0–0.2)

## 2020-07-17 LAB — GLUCOSE, CAPILLARY
Glucose-Capillary: 122 mg/dL — ABNORMAL HIGH (ref 70–99)
Glucose-Capillary: 126 mg/dL — ABNORMAL HIGH (ref 70–99)
Glucose-Capillary: 81 mg/dL (ref 70–99)
Glucose-Capillary: 87 mg/dL (ref 70–99)
Glucose-Capillary: 90 mg/dL (ref 70–99)
Glucose-Capillary: 93 mg/dL (ref 70–99)

## 2020-07-17 LAB — MAGNESIUM: Magnesium: 1.9 mg/dL (ref 1.7–2.4)

## 2020-07-17 MED ORDER — CHLORHEXIDINE GLUCONATE 0.12 % MT SOLN
OROMUCOSAL | Status: AC
  Administered 2020-07-17: 15 mL via OROMUCOSAL
  Filled 2020-07-17: qty 15

## 2020-07-17 MED ORDER — KATE FARMS STANDARD 1.4 PO LIQD
325.0000 mL | Freq: Four times a day (QID) | ORAL | Status: DC
  Administered 2020-07-17 – 2020-07-26 (×31): 325 mL
  Filled 2020-07-17 (×44): qty 325

## 2020-07-17 MED ORDER — FREE WATER
120.0000 mL | Freq: Four times a day (QID) | Status: DC
  Administered 2020-07-17 – 2020-07-27 (×38): 120 mL

## 2020-07-17 NOTE — TOC Progression Note (Signed)
Transition of Care [facility]) - Progression Note    Patient Details  Name: Virginia [name] MRN: [id number] Date of Birth: November 28, 1950  Transition of Care [facility]) CM/SW Contact  Marina Goodell Phone Number: [phone] 07/17/2020, 9:59 AM  Clinical Narrative:     CSW spoke with patient's [name] [phone] she inquired about [facility] running the patient's insurance again.  CSW told Ms. Walker I would need to reach out to Gregor Hams at [facility] and I would call her once I had an update.   CSW reached out to [facility] and inquired about patient status for admission.  Lurena Joiner RN [facility] stated she would contact me when she had an update.    Barriers to Discharge: Continued Medical Work up  Expected Discharge Plan and Services   In-house Referral: Clinical Social Work,Hospice / Palliative Care   Post Acute Care Choice: Durable Medical Equipment (Oxygen 4L) Living arrangements for the past 2 months: Apartment                                       Social Determinants of Health (SDOH) Interventions    Readmission Risk Interventions No flowsheet data found.

## 2020-07-17 NOTE — Consult Note (Signed)
NAME: Virginia [name]  DOB: Jun 23, 1951  MRN: [id number]  Date/Time: 07/17/2020 1:23 PM  REQUESTING PROVIDER: Dr. Bretta Bang Subjective:  REASON FOR CONSULT: diarrhea ?Chart reviewed thoroughly. Virginia [name] is a 70 y.o. female with a history of COPD on home oxygen, has been hospitalized since 06/06/2020 for shortness of breath, was intubated, .12/16 extubated, had to be reintubated on 06/10/20, central line on 06/10/20, tracheostomy on 06/15/20, peg placement on 06/21/20, has had trouble with tube feeds- jevity caused bloating and nausea on 07/02/20. It was changed ot osmolite on 07/04/20. She was refusing feeds because of bloating and nausea. Was Followed by nutritionist who was encouraging her to take tube feeds. On 07/08/20 she developed new hypoxemia and CXR showed b/l infiltrates concerning for aspiration pneumonia- She was started initially on zosyn 07/06/20  and then augmentin until 1.18/22. on 07/13/20 she developed AMS and was unresponsive. ABG showed severe metabolic acidosis and Co2 narcosis due to resp decomp and she was transferred to ICU. CTH did not show any bleed. EEG no seizures Because she was having diarrhea I was asked to approve Cdiff test. Pt did not have any leucocytosis or fever on 1/21 and hence diarrhea thought to be due to miralax she received on 1/18 and tube feeds. The diarrhea improved on 1/22 as she was kept NPO. On 1/22 evening tube feeds were started and she had severe bloating and diarrhea following bolus of osmolite. Pt states diarrhea better since stopping osmolite No pain abdomen No nausea No vomiting No fever Nutritionist changed the feeds today to [employer] std formula Past Medical History:  Diagnosis Date  . Anemia   . Anxiety   . Arthritis   . Atrial fibrillation ([place])   . C. difficile colitis 09/2015  . COPD (chronic obstructive pulmonary disease) ([place])   . Depression   . Dyspnea   . Dysrhythmia   . Fibromyalgia   . H/O tracheostomy   . Hep C w/ coma,  chronic   . Hypertension   . MRSA pneumonia ([place]) 2017  . On home oxygen therapy    3 L/M   . Osteoporosis   . Peripheral neuropathy   . RLS (restless legs syndrome)   . S/P percutaneous endoscopic gastrostomy (PEG) tube placement ([place]) 09/2015  . Ventilator associated pneumonia [facility]) 10/2015   [facility], [place]    Past Surgical History:  Procedure Laterality Date  . ABDOMINAL HYSTERECTOMY    . BREAST SURGERY Bilateral    Breast Implants  . DILATION AND CURETTAGE OF UTERUS    . PEG PLACEMENT N/A 06/21/2020   Procedure: PERCUTANEOUS ENDOSCOPIC GASTROSTOMY (PEG) PLACEMENT;  Surgeon: Lesly Rubenstein, MD;  Location: [facility] ENDOSCOPY;  Service: Endoscopy;  Laterality: N/A;  . REVERSE SHOULDER ARTHROPLASTY Right 03/06/2017   Procedure: REVERSE SHOULDER ARTHROPLASTY;  Surgeon: Corky Mull, MD;  Location: [facility] ORS;  Service: Orthopedics;  Laterality: Right;  . ROTATOR CUFF REPAIR Bilateral   . TRACHEOSTOMY TUBE PLACEMENT N/A 06/15/2020   Procedure: TRACHEOSTOMY;  Surgeon: Clyde Canterbury, MD;  Location: [facility] ORS;  Service: ENT;  Laterality: N/A;    Social History   Socioeconomic History  . Marital status: Single    Spouse name: Not on file  . Number of children: Not on file  . Years of education: Not on file  . Highest education level: Not on file  Occupational History  . Not on file  Tobacco Use  . Smoking status: Former Smoker    Packs/day: 1.50    Types:  Cigarettes    Quit date: 10/05/2015    Years since quitting: 4.7  . Smokeless tobacco: Never Used  Vaping Use  . Vaping Use: Some days  . Last attempt to quit: 10/05/2015  Substance and Sexual Activity  . Alcohol use: No  . Drug use: No  . Sexual activity: Not on file  Other Topics Concern  . Not on file  Social History Narrative  . Not on file   Social Determinants of Health   Financial Resource Strain: Not on file  Food Insecurity: Not on file  Transportation Needs: Not on file  Physical Activity: Not  on file  Stress: Not on file  Social Connections: Not on file  Intimate Partner Violence: Not on file    Family History  Problem Relation Age of Onset  . Hypertension Mother    No Known Allergies  Current Facility-Administered Medications  Medication Dose Route Frequency Provider Last Rate Last Admin  . 0.9 %  sodium chloride infusion (Manually program via Guardrails IV Fluids)   Intravenous Once Rust-Chester, Britton L, NP      . 0.9 %  sodium chloride infusion  250 mL Intravenous Continuous Duffy Bruce, MD   Stopped at 06/25/20 1842  . acetaminophen (TYLENOL) 160 MG/5ML solution 650 mg  650 mg Per Tube Q4H PRN Flora Lipps, MD   650 mg at 07/17/20 0925  . apixaban (ELIQUIS) tablet 5 mg  5 mg Per Tube BID Hart Robinsons A, RPH   5 mg at 07/17/20 0926  . arformoterol (BROVANA) nebulizer solution 15 mcg  15 mcg Nebulization BID Candee Furbish, MD   15 mcg at 07/17/20 [phone]  . chlorhexidine gluconate (MEDLINE KIT) (PERIDEX) 0.12 % solution 15 mL  15 mL Mouth Rinse BID Sharen Hones, MD   15 mL at 07/17/20 0926  . Chlorhexidine Gluconate Cloth 2 % PADS 6 each  6 each Topical Daily Flora Lipps, MD   6 each at 07/17/20 1051  . clonazePAM (KLONOPIN) tablet 1 mg  1 mg Per Tube BID Candee Furbish, MD   1 mg at 07/17/20 8299  . diazepam (VALIUM) tablet 5 mg  5 mg Per Tube Q6H PRN Awilda Bill, NP   5 mg at 07/17/20 0437  . feeding supplement (ENSURE ENLIVE / ENSURE PLUS) liquid 237 mL  237 mL Per Tube QID Rosine Door, MD   237 mL at 07/17/20 1050  . feeding supplement (OSMOLITE 1.2 CAL) liquid 237 mL  237 mL Per Tube 6 X Daily Rosine Door, MD   237 mL at 07/16/20 0518  . feeding supplement (PROSource TF) liquid 45 mL  45 mL Per Tube Daily Rosine Door, MD   45 mL at 07/17/20 1050  . fentaNYL (DURAGESIC) 50 MCG/HR 1 patch  1 patch Transdermal Q72H Tyler Pita, MD   1 patch at 07/14/20 1219  . free water 60 mL  60 mL Per Tube 6 X Daily Val Riles, MD   60 mL at 07/17/20 0900   . gabapentin (NEURONTIN) capsule 300 mg  300 mg Per Tube TID Tyler Pita, MD   300 mg at 07/17/20 0926  . glycopyrrolate (ROBINUL) tablet 1 mg  1 mg Per Tube BID Ottie Glazier, MD   1 mg at 07/17/20 0926  . insulin aspart (novoLOG) injection 0-6 Units  0-6 Units Subcutaneous Q4H Dallie Piles, RPH   1 Units at 07/14/20 0017  . iohexol (OMNIPAQUE) 350 MG/ML injection 75 mL  75 mL Intravenous  Once PRN Sharen Hones, MD      . ipratropium-albuterol (DUONEB) 0.5-2.5 (3) MG/3ML nebulizer solution 3 mL  3 mL Nebulization Q6H PRN Awilda Bill, NP   3 mL at 07/13/20 0611  . labetalol (NORMODYNE) injection 10 mg  10 mg Intravenous Q4H PRN Tyna Jaksch, MD   10 mg at 06/28/20 3903  . MEDLINE mouth rinse  15 mL Mouth Rinse 10 times per day Sharen Hones, MD   15 mL at 07/17/20 1051  . metoprolol tartrate (LOPRESSOR) injection 5 mg  5 mg Intravenous Q3H PRN Ottie Glazier, MD   5 mg at 06/17/20 2006  . metoprolol tartrate (LOPRESSOR) tablet 50 mg  50 mg Per Tube BID Candee Furbish, MD   50 mg at 07/17/20 0092  . naphazoline-glycerin (CLEAR EYES REDNESS) ophth solution 1-2 drop  1-2 drop Both Eyes QID PRN Sharen Hones, MD   2 drop at 07/12/20 1040  . ondansetron (ZOFRAN) injection 4 mg  4 mg Intravenous Q6H PRN Flora Lipps, MD   4 mg at 07/14/20 1000  . oxyCODONE (Oxy IR/ROXICODONE) immediate release tablet 10 mg  10 mg Per Tube Q4H PRN Candee Furbish, MD   10 mg at 07/17/20 0438  . pantoprazole (PROTONIX) injection 40 mg  40 mg Intravenous QHS Dallie Piles, RPH   40 mg at 07/16/20 2225  . piperacillin-tazobactam (ZOSYN) IVPB 3.375 g  3.375 g Intravenous Roselie Awkward, MD 12.5 mL/hr at 07/17/20 0617 3.375 g at 07/17/20 0617  . QUEtiapine (SEROQUEL) tablet 25 mg  25 mg Per Tube QHS Tyler Pita, MD   25 mg at 07/16/20 2225  . sodium chloride flush (NS) 0.9 % injection 10-40 mL  10-40 mL Intracatheter Q12H Tyler Pita, MD   10 mL at 07/16/20 2226  . sodium chloride  flush (NS) 0.9 % injection 10-40 mL  10-40 mL Intracatheter PRN Tyler Pita, MD   10 mL at 06/30/20 2104     Abtx:  Anti-infectives (From admission, onward)   Start     Dose/Rate Route Frequency Ordered Stop   07/14/20 1100  piperacillin-tazobactam (ZOSYN) IVPB 3.375 g        3.375 g 12.5 mL/hr over 240 Minutes Intravenous Every 8 hours 07/14/20 0946     07/08/20 1145  amoxicillin-clavulanate (AUGMENTIN) 875-125 MG per tablet 1 tablet  Status:  Discontinued        1 tablet Per Tube Every 12 hours 07/08/20 1055 07/11/20 0803   07/06/20 1530  piperacillin-tazobactam (ZOSYN) IVPB 3.375 g  Status:  Discontinued        3.375 g 12.5 mL/hr over 240 Minutes Intravenous Every 8 hours 07/06/20 1433 07/08/20 1055   06/21/20 1200  ceFAZolin (ANCEF) IVPB 2g/100 mL premix       Note to Pharmacy: Please have at bedside. Will be administered one hour prior to procedure. Endo staff will instruct on when to start infusion.   2 g 200 mL/hr over 30 Minutes Intravenous  Once 06/21/20 0820 06/21/20 2030   06/07/20 1400  azithromycin (ZITHROMAX) 500 mg in sodium chloride 0.9 % 250 mL IVPB  Status:  Discontinued        500 mg 250 mL/hr over 60 Minutes Intravenous Every 24 hours 06/06/20 2030 06/06/20 2044   06/07/20 1400  azithromycin (ZITHROMAX) 500 mg in sodium chloride 0.9 % 250 mL IVPB  Status:  Discontinued        500 mg 250 mL/hr over 60 Minutes Intravenous  Every 24 hours 06/06/20 2044 06/08/20 1031   06/06/20 1415  cefTRIAXone (ROCEPHIN) 2 g in sodium chloride 0.9 % 100 mL IVPB        2 g 200 mL/hr over 30 Minutes Intravenous  Once 06/06/20 1407 06/06/20 1514   06/06/20 1415  azithromycin (ZITHROMAX) 500 mg in sodium chloride 0.9 % 250 mL IVPB        500 mg 250 mL/hr over 60 Minutes Intravenous  Once 06/06/20 1407 06/06/20 1708      REVIEW OF SYSTEMS:  Const: negative fever, negative chills, negative weight loss Eyes: negative diplopia or visual changes, negative eye pain ENT: negative  coryza, negative sore throat Resp: cough,  dyspnea Cards: negative for chest pain, palpitations, lower extremity edema GU: negative for frequency, dysuria and hematuria GI: bloating and diarrhea Skin: negative for rash and pruritus Heme: negative for easy bruising and gum/nose bleeding MS: generalized weakness Neurolo:negative for headaches, dizziness, vertigo, memory problems  Psych:  anxiety,  Endocrine: negative for thyroid, diabetes Allergy/Immunology- negative for any medication or food allergies ? Objective:  VITALS:  BP 139/84   Pulse 73   Temp (!) 97.5 F (36.4 C)   Resp (!) 28   Ht 5' 2.01" (1.575 m)   Wt 55.4 kg   SpO2 (!) 86%   BMI 22.33 kg/m  PHYSICAL EXAM:  General: Alert, cooperative, no distress, sitting in chair Head: Normocephalic, without obvious abnormality, atraumatic. Eyes: Conjunctivae clear, anicteric sclerae. Pupils are equal ENT Nares normal. No drainage or sinus tenderness. Lips, mucosa, and tongue normal. No Thrush Neck:tracheostomy no carotid bruit and no JVD. Back: No CVA tenderness. Lungs: chest b/l air entry Few rhonchi. Heart: irregular Abdomen: Soft,peg in place Extremities: atraumatic, no cyanosis. No edema. No clubbing Skin: No rashes or lesions. Or bruising Lymph: Cervical, supraclavicular normal. Neurologic: Grossly non-focal Pertinent Labs Lab Results CBC    Component Value Date/Time   WBC 9.1 07/17/2020 0427   RBC 2.95 (L) 07/17/2020 0427   HGB 8.5 (L) 07/17/2020 0427   HCT 28.3 (L) 07/17/2020 0427   PLT 317 07/17/2020 0427   MCV 95.9 07/17/2020 0427   MCH 28.8 07/17/2020 0427   MCHC 30.0 07/17/2020 0427   RDW 16.9 (H) 07/17/2020 0427   LYMPHSABS 2.4 07/14/2020 0340   MONOABS 1.2 (H) 07/14/2020 0340   EOSABS 0.1 07/14/2020 0340   BASOSABS 0.1 07/14/2020 0340    CMP Latest Ref Rng & Units 07/17/2020 07/16/2020 07/15/2020  Glucose 70 - 99 mg/dL 90 91 93  BUN 8 - 23 mg/dL 19 23 26(H)  Creatinine 0.44 - 1.00 mg/dL 0.68  0.91 1.34(H)  Sodium 135 - 145 mmol/L 143 143 138  Potassium 3.5 - 5.1 mmol/L 4.0 3.5 3.7  Chloride 98 - 111 mmol/L 96(L) 96(L) 94(L)  CO2 22 - 32 mmol/L 36(H) 34(H) 33(H)  Calcium 8.9 - 10.3 mg/dL 8.8(L) 8.7(L) 8.9  Total Protein 6.5 - 8.1 g/dL - - -  Total Bilirubin 0.3 - 1.2 mg/dL - - -  Alkaline Phos 38 - 126 U/L - - -  AST 15 - 41 U/L - - -  ALT 0 - 44 U/L - - -      Microbiology: Recent Results (from the past 240 hour(s))  Culture, respiratory (non-expectorated)     Status: None   Collection Time: 07/13/20  4:20 PM   Specimen: Tracheal Aspirate; Respiratory  Result Value Ref Range Status   Specimen Description   Final    TRACHEAL ASPIRATE Performed at [facility], [address], [place], [place] [address]    Special Requests   Final    NONE Performed at [facility], [place], [place] [address]    Gram Stain NO WBC SEEN ABUNDANT GRAM POSITIVE RODS   Final   Culture   Final    FEW ENTEROBACTER AEROGENES MODERATE DIPHTHEROIDS(CORYNEBACTERIUM SPECIES) Standardized susceptibility testing for this organism is not available. Performed at [facility], [place] [address], [place], [place] [address]    Report Status 07/17/2020 FINAL  Final   Organism ID, Bacteria ENTEROBACTER AEROGENES  Final      Susceptibility   Enterobacter aerogenes - MIC*    CEFAZOLIN >=64 RESISTANT Resistant     CEFEPIME <=0.12 SENSITIVE Sensitive     CEFTAZIDIME <=1 SENSITIVE Sensitive     CEFTRIAXONE <=0.25 SENSITIVE Sensitive     CIPROFLOXACIN <=0.25 SENSITIVE Sensitive     GENTAMICIN <=1 SENSITIVE Sensitive     IMIPENEM 1 SENSITIVE Sensitive     TRIMETH/SULFA <=20 SENSITIVE Sensitive     PIP/TAZO <=4 SENSITIVE Sensitive     * FEW ENTEROBACTER AEROGENES    IMAGING RESULTS:  I have personally reviewed the films ? Impression/Recommendation COPD with resp failure- leading to intubation and now has tracheostomy.  Diarrhea- due to tube feeds-  jevity, osmolite both caused bloating and diarrhea and she has been refusing to take it Since stopping osmolite yesterday diarrhea has improved  A lot- she is now on a different feed- [employer] Since no fever, no wbc, no pain abdomen or tenderness cdiff diarrhea unlikely So will not order test now Will observe  Aspiration pneumonitis - been on zosyn since 1/21 Enterobacter in tracheal aspirate- No wbc - so this is colonization. Can stop zosyn after tomorrow.  Encephalopathy due to co2 narcosis /metabolic acidosis has resolved  Anemia  Discussed the management with the patient and her nurse and care team  Note:  This document was prepared using Dragon voice recognition software and may include unintentional dictation errors.

## 2020-07-17 NOTE — Evaluation (Signed)
Occupational Therapy Evaluation Patient Details Name: Virginia [name] MRN: [id number] DOB: 03-30-1951 Today's Date: 07/17/2020    History of Present Illness 70 yo female with severe COPD admitted with acute on chronic hypoxic hypercapnic respiratory failure and acute encephalopathy secondary to narcotic use and AECOPD requiring mechanical intubation. Initially intubated from 12/14-12/16 and then again from 12/18-12/23. Now with + trach collar. Recent hospitalization from 12/10-12/12/21. RR secondary to slurred speech and AMS. Transferred to CCU on 1/20 on full vent support. RE-evaluation performed this date as medical status has improved.   Clinical Impression   Pt seen for OT re-evaluation this date in setting of prolonged hospitalization and re-admit to the ICU, with medical status now improving. Pt on trach collar. Pt requires MIN A for sup to sit, MIN A for SPS transfer to chair. Pt tolerates well, only de-sats to 88% on trach collar. Recovers to >90% within ~20 second seated rest break. OT engages pt in LB dressing with MIN A in sitting and pt requires MOD A for posterior peri care in standing. Pt left in chair with all needs in reach. RN notified. Continue to recommend SNF upon d/c from acute setting.     Follow Up Recommendations  SNF    Equipment Recommendations  Other (comment) (defer)    Recommendations for Other Services       Precautions / Restrictions Precautions Precautions: Fall Precaution Comments: trach Restrictions Weight Bearing Restrictions: No Other Position/Activity Restrictions: watch sats      Mobility Bed Mobility Overal bed mobility: Needs Assistance Bed Mobility: Supine to Sit     Supine to sit: Min assist     General bed mobility comments: use of bed rails    Transfers Overall transfer level: Needs assistance Equipment used: 1 person hand held assist Transfers: Sit to/from Stand Sit to Stand: Min assist         General transfer comment:  cues for safety/line mgt    Balance Overall balance assessment: Needs assistance Sitting-balance support: Single extremity supported;Feet supported Sitting balance-Leahy Scale: Good     Standing balance support: Bilateral upper extremity supported Standing balance-Leahy Scale: Fair                             ADL either performed or assessed with clinical judgement   ADL                                         General ADL Comments: MIN A for seated LB ADLs, SETUP for seated UB ADLs, MOD A for standing peri care. MIN A with hand held assist for ADL transfers.     Vision Patient Visual Report: No change from baseline       Perception     Praxis      Pertinent Vitals/Pain Pain Assessment: No/denies pain     Hand Dominance Right   Extremity/Trunk Assessment Upper Extremity Assessment Upper Extremity Assessment: RUE deficits/detail;LUE deficits/detail RUE Deficits / Details: grossly 4-/5 LUE Deficits / Details: grossly 4-/5   Lower Extremity Assessment Lower Extremity Assessment: Defer to PT evaluation;Overall [facility] for tasks assessed;Generalized weakness       Communication Communication Communication: Tracheostomy   Cognition Arousal/Alertness: Awake/alert Behavior During Therapy: WFL for tasks assessed/performed Overall Cognitive Status: Within Functional Limits for tasks assessed  General Comments       Exercises Other Exercises Other Exercises: OT educates re: importance of OOB activity   Shoulder Instructions      Home Living Family/patient expects to be discharged to:: Private residence Living Arrangements: Alone Available Help at Discharge: Family;Available PRN/intermittently Type of Home: Apartment Home Access: Level entry     Home Layout: One level     Bathroom Shower/Tub: Tub/shower unit         Home Equipment: Emergency planning/management officer - 4 wheels           Prior Functioning/Environment Level of Independence: Independent with assistive device(s)        Comments: MOD I using RW including driving        OT Problem List: Decreased strength;Decreased range of motion;Decreased activity tolerance;Impaired balance (sitting and/or standing);Decreased cognition;Decreased safety awareness;Pain      OT Treatment/Interventions: Self-care/ADL training;Therapeutic exercise;Energy conservation;DME and/or AE instruction;Therapeutic activities;Patient/family education;Balance training    OT Goals(Current goals can be found in the care plan section) Acute Rehab OT Goals Patient Stated Goal: To return home safely OT Goal Formulation: With patient Time For Goal Achievement: 07/31/20 Potential to Achieve Goals: Good  OT Frequency: Min 1X/week   Barriers to D/C: Decreased caregiver support          Co-evaluation              AM-PAC OT "6 Clicks" Daily Activity     Outcome Measure Help from another person eating meals?: A Little Help from another person taking care of personal grooming?: A Little Help from another person toileting, which includes using toliet, bedpan, or urinal?: A Little Help from another person bathing (including washing, rinsing, drying)?: A Little Help from another person to put on and taking off regular upper body clothing?: A Little Help from another person to put on and taking off regular lower body clothing?: A Little 6 Click Score: 18   End of Session Equipment Utilized During Treatment: Oxygen;Other (comment) Nurse Communication: Mobility status  Activity Tolerance: Patient tolerated treatment well Patient left: in bed;with call bell/phone within reach;with nursing/sitter in room  OT Visit Diagnosis: Other abnormalities of gait and mobility (R26.89);Muscle weakness (generalized) (M62.81)                Time: 3149-7026 OT Time Calculation (min): 47 min Charges:  OT General Charges $OT Visit: 1 Visit OT  Evaluation $OT Re-eval: 1 Re-eval OT Treatments $Self Care/Home Management : 23-37 mins $Therapeutic Activity: 8-22 mins  Rejeana Brock, MS, OTR/L ascom [phone] 07/17/20, 3:42 PM

## 2020-07-17 NOTE — Progress Notes (Addendum)
Neuro: Stable at baseline Resp: stable on trach collar CV: afebrile, vital signs stable GIGU: some loose stool but improved, dietician changed tube feeds, hoping this will improve BM status Skin: moisture associated breakdown on buttocks Social: daughter in law visited today, all questions and concerns addressed  Events: 1 desat event to the 50s required aggressive suctioning. Once copious secretions cleared, pt recovered well with no further issue.

## 2020-07-17 NOTE — Progress Notes (Signed)
Nutrition Follow-up  DOCUMENTATION CODES:   Not applicable  INTERVENTION:   Initiate [employer] Standard 1.4 Formula - 4 cartons daily via tube- Flush with 55m of water before and after each feed  Regimen provides 1820kcal/day, 80g/day protein and 14125mday free water  NUTRITION DIAGNOSIS:   Increased nutrient needs related to catabolic illness (COPD) as evidenced by estimated needs. Ongoing.  GOAL:   Patient will meet greater than or equal to 90% of their needs -Met with TF regimen.  MONITOR:   Labs,Weight trends,TF tolerance,Skin,I & O's  ASSESSMENT:   [age]ear old female with PMHx of COPD, hx tracheostomy tube placement and PEG tube placement in 2017, HTN, depression, RLS, osteoporosis, hepatitis C, A-fib, anxiety, arthritis admitted with acute on chronic hypoxic hypercapnic respiratory failure and acute encephalopathy secondary to narcotic use and acute exacerbation of COPD.   Pt unable to tolerate Osmolite 1.5 r/t GI intolerance. Pt reports "bubbles" in her stomach and diarrhea which RN reports improved after holding the tube feeds and worsened once the tube feeds were started back. Pt has been getting Ensure via her tube over the weekend. Spoke with RN, will change pt over to [employer] formula which is designed for people with GI intolerance; this is also a high calorie formula and will reduce the amount of feeds that pt will need. Notified RN, will plan to start with 1/2 carton today and advance as tolerated. Per chart, pt has remained fairly weight stable since admit.   Medications reviewed and include: insulin, protonix, zosyn   Labs reviewed: Hgb 8.5(L), Hct 28.3(L), K 4.0 wnl, P 3.1 wnl, Mg 1.9 wnl  Diet Order:   Diet Order            Diet NPO time specified  Diet effective now                EDUCATION NEEDS:   No education needs have been identified at this time  Skin:  Skin Assessment: Reviewed RN Assessment (MASD to groin and perineum) Skin  Integrity Issues:: Incisions Incisions: closed incision to neck  Last BM:  1/24- type 7  Height:   Ht Readings from Last 1 Encounters:  07/14/20 5' 2.01" (1.575 m)   Weight:   Wt Readings from Last 1 Encounters:  07/17/20 55.4 kg   Ideal Body Weight:  50 kg  BMI:  Body mass index is 22.33 kg/m.  Estimated Nutritional Needs:   Kcal:  1500-1700kcal/day  Protein:  75-85g/day  Fluid:  1.5-1.7 L/day  CaKoleen DistanceS, RD, LDN Please refer to [facility]or RD and/or RD on-call/weekend/after hours pager

## 2020-07-17 NOTE — Progress Notes (Signed)
PROGRESS NOTE    Virginia [name]  DJT:[id number] DOB: Jul 07, 1950 DOA: 06/06/2020 PCP: Idelle Crouch, MD    Brief Narrative:  This 70 years old female with severe COPD admitted with acute on chronic hypoxic and hypercapnic respiratory failure and acute encephalopathy secondary to narcotic use and advanced COPD requiring mechanical ventilation.  She was treated with steroids and IV antibiotics.  She could not be liberated from the ventilator so tracheostomy was performed and she has been receiving oxygen via trach collar.  Patient developed worsening hypoxia on 1/15 chest x-ray shows bilateral lower lobe infiltrate consistent with aspiration pneumonia.  She has completed antibiotics.  On 1/20 patient had a sudden onset of altered mental status,  ABG shows severe metabolic acidosis patient was transferred to ICU. PCCM pickup 1/24: Acute encephalopathy resolved.  CT head unremarkable for any acute stroke or bleeding.  Mental status normalized without any focal deficit.  COPD appears stable.  Assessment & Plan:   Active Problems:   AF (paroxysmal atrial fibrillation) (HCC)   Anemia of chronic disease   COPD with acute lower respiratory infection ([place])   Dysphagia   Respiratory failure (HCC)   COPD exacerbation (HCC)   Acute urinary retention   Acute metabolic encephalopathy   Acute on chronic respiratory failure with hypoxia and hypercapnia (HCC)   Aspiration pneumonia of both lower lobes due to gastric secretions (HCC)   Reactive thrombocytosis   Pressure injury of skin   Diarrhea   Acute on chronic respiratory failure with hypoxemia and hypercapnia. COPD exacerbation: Stable. Aspiration pneumonia : Completed antibiotics. Continue current nebulization no need for IV steroids. Continue Zosyn for aspiration pneumonia procalcitonin trending down. Repeat ABG shows decreased PCO2.   Acute Encephalopathy: Resolved. Patient had a sudden onset of slurred speech and altered mental  status. Differential including mucous plug, aspiration pneumonia versus acute intracranial hemorrhage. Stat ABG showed pH of 7.0.  Patient was not responding, code stroke called. Stat CT scan to ruled out intracranial hemorrhage. Patient does not have hypoglycemia. Patient was transferred to ICU.   Severe metabolic acidosis>> resolved Etiology still unclear, it appears to be sudden onset.   Diarrhea: Patient has been on antibiotics but no leukocytosis or abdominal pain,  ID recommended no need for C. difficile testing.   Dysphagia. Deconditioning Moderate protein calorie malnutrition.  Anxiety :  continue Klonopin.  Paroxysmal A. Fib: Continue Eliquis, normal sinus rhythm.     DVT prophylaxis: Eliquis  Code Status: Full code Family Communication: No family at bedside Disposition Plan:  Status is: Inpatient  Remains inpatient appropriate because:Inpatient level of care appropriate due to severity of illness   Dispo: The patient is from: Home              Anticipated d/c is to: SNF              Anticipated d/c date is: > 3 days              Patient currently is not medically stable to d/c.   Difficult to place patient No    Consultants:   PCCM  Procedures:  Antimicrobials:  Anti-infectives (From admission, onward)   Start     Dose/Rate Route Frequency Ordered Stop   07/14/20 1100  piperacillin-tazobactam (ZOSYN) IVPB 3.375 g        3.375 g 12.5 mL/hr over 240 Minutes Intravenous Every 8 hours 07/14/20 0946     07/08/20 1145  amoxicillin-clavulanate (AUGMENTIN) 875-125 MG per tablet 1 tablet  Status:  Discontinued        1 tablet Per Tube Every 12 hours 07/08/20 1055 07/11/20 0803   07/06/20 1530  piperacillin-tazobactam (ZOSYN) IVPB 3.375 g  Status:  Discontinued        3.375 g 12.5 mL/hr over 240 Minutes Intravenous Every 8 hours 07/06/20 1433 07/08/20 1055   06/21/20 1200  ceFAZolin (ANCEF) IVPB 2g/100 mL premix       Note to Pharmacy: Please have at  bedside. Will be administered one hour prior to procedure. Endo staff will instruct on when to start infusion.   2 g 200 mL/hr over 30 Minutes Intravenous  Once 06/21/20 0820 06/21/20 2030   06/07/20 1400  azithromycin (ZITHROMAX) 500 mg in sodium chloride 0.9 % 250 mL IVPB  Status:  Discontinued        500 mg 250 mL/hr over 60 Minutes Intravenous Every 24 hours 06/06/20 2030 06/06/20 2044   06/07/20 1400  azithromycin (ZITHROMAX) 500 mg in sodium chloride 0.9 % 250 mL IVPB  Status:  Discontinued        500 mg 250 mL/hr over 60 Minutes Intravenous Every 24 hours 06/06/20 2044 06/08/20 1031   06/06/20 1415  cefTRIAXone (ROCEPHIN) 2 g in sodium chloride 0.9 % 100 mL IVPB        2 g 200 mL/hr over 30 Minutes Intravenous  Once 06/06/20 1407 06/06/20 1514   06/06/20 1415  azithromycin (ZITHROMAX) 500 mg in sodium chloride 0.9 % 250 mL IVPB        500 mg 250 mL/hr over 60 Minutes Intravenous  Once 06/06/20 1407 06/06/20 1708     Subjective: Patient was seen and examined at bedside.  Overnight events noted.   She seems alert and oriented following commands.  Reports feeling much better  Objective: Vitals:   07/17/20 0600 07/17/20 0700 07/17/20 0800 07/17/20 0900  BP: (!) 88/56 (!) 96/59 100/62 139/84  Pulse: 81 83 83 73  Resp: '18 20 20 ' (!) 28  Temp:    (!) 97.5 F (36.4 C)  TempSrc:      SpO2: 96% 98% 97% (!) 86%  Weight:      Height:        Intake/Output Summary (Last 24 hours) at 07/17/2020 1550 Last data filed at 07/16/2020 2226 Gross per 24 hour  Intake 18.96 ml  Output --  Net 18.96 ml   Filed Weights   07/15/20 0500 07/16/20 0500 07/17/20 0426  Weight: 56.5 kg 56.4 kg 55.4 kg    Examination:  General exam: Appears calm and comfortable , trach collar noted. Respiratory system: Clear to auscultation. Respiratory effort normal. Cardiovascular system: S1 & S2 heard, RRR. No JVD, murmurs, rubs, gallops or clicks. No pedal edema. Gastrointestinal system: Abdomen is  nondistended, soft and nontender. No organomegaly or masses felt. Normal bowel sounds heard.  PEG tube noted Central nervous system: Alert and oriented. No focal neurological deficits. Extremities: Symmetric 5 x 5 power. Skin: No rashes, lesions or ulcers Psychiatry: Judgement and insight appear normal. Mood & affect appropriate.     Data Reviewed: I have personally reviewed following labs and imaging studies  CBC: Recent Labs  Lab 07/12/20 0535 07/13/20 1104 07/14/20 0340 07/15/20 0433 07/15/20 2042 07/16/20 0342 07/17/20 0427  WBC 10.1 13.1* 12.4* 9.5  --  6.6 9.1  NEUTROABS 7.2 11.4* 8.5*  --   --   --   --   HGB 7.6* 7.9* 7.4* 6.5* 8.2* 7.7* 8.5*  HCT 25.8* 27.4* 23.8* 21.3* 27.0* 25.6*  28.3*  MCV 98.1 99.3 94.8 94.2  --  94.8 95.9  PLT 416* 427* 382 319  --  291 937   Basic Metabolic Panel: Recent Labs  Lab 07/12/20 0535 07/13/20 1104 07/13/20 1507 07/15/20 0433 07/16/20 0342 07/17/20 0427  NA 141 138 136 138 143 143  K 4.3 5.3* 5.3* 3.7 3.5 4.0  CL 98 93* 91* 94* 96* 96*  CO2 36* 37* 33* 33* 34* 36*  GLUCOSE 121* 256* 85 93 91 90  BUN '16 19 23 ' 26* 23 19  CREATININE 0.54 0.79 0.90 1.34* 0.91 0.68  CALCIUM 8.9 9.1 9.0 8.9 8.7* 8.8*  MG 1.7 1.8  --  1.9 2.1 1.9  PHOS 3.3  --   --  5.5* 5.8* 3.1   GFR: Estimated Creatinine Clearance: 52.5 mL/min (by C-G formula based on SCr of 0.68 mg/dL). Liver Function Tests: Recent Labs  Lab 07/13/20 1104 07/16/20 0342 07/17/20 0427  AST 31  --   --   ALT 23  --   --   ALKPHOS 19*  --   --   BILITOT 0.3  --   --   PROT 6.5  --   --   ALBUMIN 2.9* 2.5* 2.7*   No results for input(s): LIPASE, AMYLASE in the last 168 hours. No results for input(s): AMMONIA in the last 168 hours. Coagulation Profile: Recent Labs  Lab 07/13/20 1104  INR 1.3*   Cardiac Enzymes: No results for input(s): CKTOTAL, CKMB, CKMBINDEX, TROPONINI in the last 168 hours. BNP (last 3 results) No results for input(s): PROBNP in the last 8760  hours. HbA1C: No results for input(s): HGBA1C in the last 72 hours. CBG: Recent Labs  Lab 07/16/20 2046 07/17/20 0006 07/17/20 0400 07/17/20 0856 07/17/20 1214  GLUCAP 111* 126* 81 90 93   Lipid Profile: No results for input(s): CHOL, HDL, LDLCALC, TRIG, CHOLHDL, LDLDIRECT in the last 72 hours. Thyroid Function Tests: No results for input(s): TSH, T4TOTAL, FREET4, T3FREE, THYROIDAB in the last 72 hours. Anemia Panel: Recent Labs    07/16/20 0342  FERRITIN 415*  TIBC 231*  IRON 16*   Sepsis Labs: Recent Labs  Lab 07/13/20 1104 07/13/20 1507 07/13/20 1708 07/14/20 0340 07/15/20 0433  PROCALCITON  --  0.13  --  0.28 0.18  LATICACIDVEN 1.8 2.2* 1.4  --   --     Recent Results (from the past 240 hour(s))  Culture, respiratory (non-expectorated)     Status: None   Collection Time: 07/13/20  4:20 PM   Specimen: Tracheal Aspirate; Respiratory  Result Value Ref Range Status   Specimen Description   Final    TRACHEAL ASPIRATE Performed at [facility], [address], [place], [place] [address]    Special Requests   Final    NONE Performed at [facility], [place], [place] [address]    Gram Stain NO WBC SEEN ABUNDANT GRAM POSITIVE RODS   Final   Culture   Final    FEW ENTEROBACTER AEROGENES MODERATE DIPHTHEROIDS(CORYNEBACTERIUM SPECIES) Standardized susceptibility testing for this organism is not available. Performed at [facility], [place] [address] [place], [place] [address]    Report Status 07/17/2020 FINAL  Final   Organism ID, Bacteria ENTEROBACTER AEROGENES  Final      Susceptibility   Enterobacter aerogenes - MIC*    CEFAZOLIN >=64 RESISTANT Resistant     CEFEPIME <=0.12 SENSITIVE Sensitive     CEFTAZIDIME <=1 SENSITIVE Sensitive  CEFTRIAXONE <=0.25 SENSITIVE Sensitive     CIPROFLOXACIN <=0.25 SENSITIVE Sensitive     GENTAMICIN <=1 SENSITIVE Sensitive     IMIPENEM 1 SENSITIVE Sensitive     TRIMETH/SULFA  <=20 SENSITIVE Sensitive     PIP/TAZO <=4 SENSITIVE Sensitive     * FEW ENTEROBACTER AEROGENES    Radiology Studies: No results found.   Scheduled Meds: . sodium chloride   Intravenous Once  . apixaban  5 mg Per Tube BID  . arformoterol  15 mcg Nebulization BID  . chlorhexidine gluconate (MEDLINE KIT)  15 mL Mouth Rinse BID  . Chlorhexidine Gluconate Cloth  6 each Topical Daily  . clonazePAM  1 mg Per Tube BID  . feeding supplement ([employer] STANDARD 1.4)  325 mL Per Tube QID  . fentaNYL  1 patch Transdermal Q72H  . free water  120 mL Per Tube QID  . gabapentin  300 mg Per Tube TID  . glycopyrrolate  1 mg Per Tube BID  . insulin aspart  0-6 Units Subcutaneous Q4H  . mouth rinse  15 mL Mouth Rinse 10 times per day  . metoprolol tartrate  50 mg Per Tube BID  . pantoprazole (PROTONIX) IV  40 mg Intravenous QHS  . QUEtiapine  25 mg Per Tube QHS  . sodium chloride flush  10-40 mL Intracatheter Q12H   Continuous Infusions: . sodium chloride Stopped (06/25/20 1842)  . piperacillin-tazobactam (ZOSYN)  IV 3.375 g (07/17/20 0617)     LOS: 41 days    Time spent: 67 mins    Samaiyah Howes, MD [facility]   If 7PM-7AM, please contact night-coverage

## 2020-07-18 DIAGNOSIS — J441 Chronic obstructive pulmonary disease with (acute) exacerbation: Secondary | ICD-10-CM | POA: Diagnosis not present

## 2020-07-18 DIAGNOSIS — J9601 Acute respiratory failure with hypoxia: Secondary | ICD-10-CM | POA: Diagnosis not present

## 2020-07-18 DIAGNOSIS — K909 Intestinal malabsorption, unspecified: Secondary | ICD-10-CM | POA: Diagnosis not present

## 2020-07-18 DIAGNOSIS — J690 Pneumonitis due to inhalation of food and vomit: Secondary | ICD-10-CM | POA: Diagnosis not present

## 2020-07-18 DIAGNOSIS — G9341 Metabolic encephalopathy: Secondary | ICD-10-CM | POA: Diagnosis not present

## 2020-07-18 LAB — CBC
HCT: 27.8 % — ABNORMAL LOW (ref 36.0–46.0)
Hemoglobin: 8.3 g/dL — ABNORMAL LOW (ref 12.0–15.0)
MCH: 29.2 pg (ref 26.0–34.0)
MCHC: 29.9 g/dL — ABNORMAL LOW (ref 30.0–36.0)
MCV: 97.9 fL (ref 80.0–100.0)
Platelets: 301 10*3/uL (ref 150–400)
RBC: 2.84 MIL/uL — ABNORMAL LOW (ref 3.87–5.11)
RDW: 16.1 % — ABNORMAL HIGH (ref 11.5–15.5)
WBC: 8.3 10*3/uL (ref 4.0–10.5)
nRBC: 0 % (ref 0.0–0.2)

## 2020-07-18 LAB — BASIC METABOLIC PANEL
Anion gap: 9 (ref 5–15)
BUN: 17 mg/dL (ref 8–23)
CO2: 37 mmol/L — ABNORMAL HIGH (ref 22–32)
Calcium: 8.6 mg/dL — ABNORMAL LOW (ref 8.9–10.3)
Chloride: 95 mmol/L — ABNORMAL LOW (ref 98–111)
Creatinine, Ser: 0.58 mg/dL (ref 0.44–1.00)
GFR, Estimated: >60 mL/min (ref >60)
Glucose, Bld: 92 mg/dL (ref 70–99)
Potassium: 4.2 mmol/L (ref 3.5–5.1)
Sodium: 141 mmol/L (ref 135–145)

## 2020-07-18 LAB — MAGNESIUM: Magnesium: 1.8 mg/dL (ref 1.7–2.4)

## 2020-07-18 LAB — PHOSPHORUS: Phosphorus: 2.4 mg/dL — ABNORMAL LOW (ref 2.5–4.6)

## 2020-07-18 LAB — GLUCOSE, CAPILLARY
Glucose-Capillary: 107 mg/dL — ABNORMAL HIGH (ref 70–99)
Glucose-Capillary: 109 mg/dL — ABNORMAL HIGH (ref 70–99)
Glucose-Capillary: 110 mg/dL — ABNORMAL HIGH (ref 70–99)
Glucose-Capillary: 110 mg/dL — ABNORMAL HIGH (ref 70–99)
Glucose-Capillary: 86 mg/dL (ref 70–99)
Glucose-Capillary: 91 mg/dL (ref 70–99)
Glucose-Capillary: 93 mg/dL (ref 70–99)

## 2020-07-18 MED ORDER — PANTOPRAZOLE SODIUM 40 MG PO PACK
40.0000 mg | PACK | Freq: Every day | ORAL | Status: DC
  Administered 2020-07-18 – 2020-07-26 (×9): 40 mg
  Filled 2020-07-18 (×10): qty 20

## 2020-07-18 NOTE — Progress Notes (Signed)
PROGRESS NOTE    Virginia [name]  MHD:[id number] DOB: 1950/12/10 DOA: 06/06/2020 PCP: Idelle Crouch, MD    Brief Narrative:  This 70 years old female with severe COPD admitted with acute on chronic hypoxic and hypercapnic respiratory failure and acute encephalopathy secondary to narcotic use and advanced COPD requiring mechanical ventilation.  She was treated with steroids and IV antibiotics.  She could not be liberated from the ventilator so tracheostomy was performed and she has been receiving oxygen via trach collar.  Patient developed worsening hypoxia on 1/15 chest x-ray shows bilateral lower lobe infiltrate consistent with aspiration pneumonia.  She has completed antibiotics.  On 1/20 patient had a sudden onset of altered mental status,  ABG shows severe metabolic acidosis.  Patient was transferred to ICU. PCCM pickup 1/24: Acute encephalopathy resolved.  CT head unremarkable for any acute stroke or bleeding.  Mental status normalized without any focal deficit. COPD appears stable.  Assessment & Plan:   Active Problems:   AF (paroxysmal atrial fibrillation) (HCC)   Anemia of chronic disease   COPD with acute lower respiratory infection ([place])   Dysphagia   Respiratory failure (HCC)   COPD exacerbation (HCC)   Acute urinary retention   Acute metabolic encephalopathy   Acute on chronic respiratory failure with hypoxia and hypercapnia (HCC)   Aspiration pneumonia of both lower lobes due to gastric secretions (HCC)   Reactive thrombocytosis   Pressure injury of skin   Diarrhea   Acute on chronic respiratory failure with hypoxemia and hypercapnia. COPD exacerbation: Stable. Aspiration pneumonia :  Continue Zosyn for 7 days, Last day 07/20/20 Continue current nebulization,  no need for IV steroids. Continue Zosyn for aspiration pneumonia,  procalcitonin trending down. Repeat ABG shows decreased PCO2. Infectious Diseases consulted. She is on trach collar, O2 sats 97% on 8  L   Acute Encephalopathy:  >>>.Resolved. Patient had a sudden onset of slurred speech and altered mental status. Differential including mucous plug, aspiration pneumonia versus acute intracranial hemorrhage. Stat ABG showed pH of 7.0.  Patient was not responding, code stroke called. Stat CT scan to ruled out intracranial hemorrhage. Patient does not have hypoglycemia. Patient was transferred to ICU. She is back to her baseline mental status.     Severe metabolic acidosis>> resolved Etiology still unclear, it appears to be sudden onset.   Diarrhea: >>> Improving Patient has been on antibiotics but no leukocytosis or abdominal pain,   ID recommended no need for C. difficile testing.   Dysphagia. Deconditioning Moderate protein calorie malnutrition.  Anxiety :  continue Klonopin.  Paroxysmal A. Fib:  Continue Eliquis, normal sinus rhythm. Heart rate controlled.   DVT prophylaxis: Eliquis  Code Status: Full code Family Communication: No family at bedside Disposition Plan:  Status is: Inpatient  Remains inpatient appropriate because:Inpatient level of care appropriate due to severity of illness   Dispo: The patient is from: Home              Anticipated d/c is to: SNF              Anticipated d/c date is: > 3 days              Patient currently is not medically stable to d/c.   Difficult to place patient No    Consultants:   PCCM  Procedures:  Antimicrobials:  Anti-infectives (From admission, onward)   Start     Dose/Rate Route Frequency Ordered Stop   07/14/20 1100  piperacillin-tazobactam (ZOSYN) IVPB  3.375 g        3.375 g 12.5 mL/hr over 240 Minutes Intravenous Every 8 hours 07/14/20 0946 07/18/20 2359   07/08/20 1145  amoxicillin-clavulanate (AUGMENTIN) 875-125 MG per tablet 1 tablet  Status:  Discontinued        1 tablet Per Tube Every 12 hours 07/08/20 1055 07/11/20 0803   07/06/20 1530  piperacillin-tazobactam (ZOSYN) IVPB 3.375 g  Status:   Discontinued        3.375 g 12.5 mL/hr over 240 Minutes Intravenous Every 8 hours 07/06/20 1433 07/08/20 1055   06/21/20 1200  ceFAZolin (ANCEF) IVPB 2g/100 mL premix       Note to Pharmacy: Please have at bedside. Will be administered one hour prior to procedure. Endo staff will instruct on when to start infusion.   2 g 200 mL/hr over 30 Minutes Intravenous  Once 06/21/20 0820 06/21/20 2030   06/07/20 1400  azithromycin (ZITHROMAX) 500 mg in sodium chloride 0.9 % 250 mL IVPB  Status:  Discontinued        500 mg 250 mL/hr over 60 Minutes Intravenous Every 24 hours 06/06/20 2030 06/06/20 2044   06/07/20 1400  azithromycin (ZITHROMAX) 500 mg in sodium chloride 0.9 % 250 mL IVPB  Status:  Discontinued        500 mg 250 mL/hr over 60 Minutes Intravenous Every 24 hours 06/06/20 2044 06/08/20 1031   06/06/20 1415  cefTRIAXone (ROCEPHIN) 2 g in sodium chloride 0.9 % 100 mL IVPB        2 g 200 mL/hr over 30 Minutes Intravenous  Once 06/06/20 1407 06/06/20 1514   06/06/20 1415  azithromycin (ZITHROMAX) 500 mg in sodium chloride 0.9 % 250 mL IVPB        500 mg 250 mL/hr over 60 Minutes Intravenous  Once 06/06/20 1407 06/06/20 1708     Subjective: Patient was seen and examined at bedside.  Overnight events noted.   She seems alert and oriented,  following commands.  Reports feeling much better.  Objective: Vitals:   07/18/20 0733 07/18/20 0800 07/18/20 0900 07/18/20 1000  BP:  110/66 122/86 116/77  Pulse:  87 98 90  Resp:  (!) 26    Temp:  98.4 F (36.9 C)    TempSrc:  Oral    SpO2: 97% 95% 98% 97%  Weight:      Height:        Intake/Output Summary (Last 24 hours) at 07/18/2020 1606 Last data filed at 07/18/2020 0800 Gross per 24 hour  Intake 268.56 ml  Output 3 ml  Net 265.56 ml   Filed Weights   07/16/20 0500 07/17/20 0426 07/18/20 0500  Weight: 56.4 kg 55.4 kg 54.9 kg    Examination:  General exam: Appears calm and comfortable , trach collar noted. Respiratory system:  Clear to auscultation. Respiratory effort normal. Cardiovascular system: S1 & S2 heard, RRR. No JVD, murmurs, rubs, gallops or clicks. No pedal edema. Gastrointestinal system: Abdomen is nondistended, soft and nontender. No organomegaly or masses felt. Normal bowel sounds heard.  PEG tube noted Central nervous system: Alert and oriented. No focal neurological deficits. Extremities: Symmetric 5 x 5 power. Skin: No rashes, lesions or ulcers Psychiatry: Judgement and insight appear normal. Mood & affect appropriate.     Data Reviewed: I have personally reviewed following labs and imaging studies  CBC: Recent Labs  Lab 07/12/20 0535 07/13/20 1104 07/14/20 0340 07/15/20 0433 07/15/20 2042 07/16/20 0342 07/17/20 0427 07/18/20 0521  WBC 10.1 13.1* 12.4*  9.5  --  6.6 9.1 8.3  NEUTROABS 7.2 11.4* 8.5*  --   --   --   --   --   HGB 7.6* 7.9* 7.4* 6.5* 8.2* 7.7* 8.5* 8.3*  HCT 25.8* 27.4* 23.8* 21.3* 27.0* 25.6* 28.3* 27.8*  MCV 98.1 99.3 94.8 94.2  --  94.8 95.9 97.9  PLT 416* 427* 382 319  --  291 317 144   Basic Metabolic Panel: Recent Labs  Lab 07/12/20 0535 07/13/20 1104 07/13/20 1507 07/15/20 0433 07/16/20 0342 07/17/20 0427 07/18/20 0521  NA 141 138 136 138 143 143 141  K 4.3 5.3* 5.3* 3.7 3.5 4.0 4.2  CL 98 93* 91* 94* 96* 96* 95*  CO2 36* 37* 33* 33* 34* 36* 37*  GLUCOSE 121* 256* 85 93 91 90 92  BUN '16 19 23 ' 26* '23 19 17  ' CREATININE 0.54 0.79 0.90 1.34* 0.91 0.68 0.58  CALCIUM 8.9 9.1 9.0 8.9 8.7* 8.8* 8.6*  MG 1.7 1.8  --  1.9 2.1 1.9 1.8  PHOS 3.3  --   --  5.5* 5.8* 3.1 2.4*   GFR: Estimated Creatinine Clearance: 52.5 mL/min (by C-G formula based on SCr of 0.58 mg/dL). Liver Function Tests: Recent Labs  Lab 07/13/20 1104 07/16/20 0342 07/17/20 0427  AST 31  --   --   ALT 23  --   --   ALKPHOS 19*  --   --   BILITOT 0.3  --   --   PROT 6.5  --   --   ALBUMIN 2.9* 2.5* 2.7*   No results for input(s): LIPASE, AMYLASE in the last 168 hours. No results  for input(s): AMMONIA in the last 168 hours. Coagulation Profile: Recent Labs  Lab 07/13/20 1104  INR 1.3*   Cardiac Enzymes: No results for input(s): CKTOTAL, CKMB, CKMBINDEX, TROPONINI in the last 168 hours. BNP (last 3 results) No results for input(s): PROBNP in the last 8760 hours. HbA1C: No results for input(s): HGBA1C in the last 72 hours. CBG: Recent Labs  Lab 07/18/20 0010 07/18/20 0410 07/18/20 0723 07/18/20 1123 07/18/20 1542  GLUCAP 110* 91 86 107* 110*   Lipid Profile: No results for input(s): CHOL, HDL, LDLCALC, TRIG, CHOLHDL, LDLDIRECT in the last 72 hours. Thyroid Function Tests: No results for input(s): TSH, T4TOTAL, FREET4, T3FREE, THYROIDAB in the last 72 hours. Anemia Panel: Recent Labs    07/16/20 0342  FERRITIN 415*  TIBC 231*  IRON 16*   Sepsis Labs: Recent Labs  Lab 07/13/20 1104 07/13/20 1507 07/13/20 1708 07/14/20 0340 07/15/20 0433  PROCALCITON  --  0.13  --  0.28 0.18  LATICACIDVEN 1.8 2.2* 1.4  --   --     Recent Results (from the past 240 hour(s))  Culture, respiratory (non-expectorated)     Status: None   Collection Time: 07/13/20  4:20 PM   Specimen: Tracheal Aspirate; Respiratory  Result Value Ref Range Status   Specimen Description   Final    TRACHEAL ASPIRATE Performed at [facility], [address], [place], [place] [address]    Special Requests   Final    NONE Performed at [facility], [place], [place] [address]    Gram Stain NO WBC SEEN ABUNDANT GRAM POSITIVE RODS   Final   Culture   Final    FEW ENTEROBACTER AEROGENES MODERATE DIPHTHEROIDS(CORYNEBACTERIUM SPECIES) Standardized susceptibility testing for this organism is not available. Performed at [facility], [place] [address]  [place], [place] [address]    Report Status 07/17/2020 FINAL  Final   Organism ID, Bacteria ENTEROBACTER AEROGENES  Final      Susceptibility   Enterobacter aerogenes - MIC*    CEFAZOLIN  >=64 RESISTANT Resistant     CEFEPIME <=0.12 SENSITIVE Sensitive     CEFTAZIDIME <=1 SENSITIVE Sensitive     CEFTRIAXONE <=0.25 SENSITIVE Sensitive     CIPROFLOXACIN <=0.25 SENSITIVE Sensitive     GENTAMICIN <=1 SENSITIVE Sensitive     IMIPENEM 1 SENSITIVE Sensitive     TRIMETH/SULFA <=20 SENSITIVE Sensitive     PIP/TAZO <=4 SENSITIVE Sensitive     * FEW ENTEROBACTER AEROGENES    Radiology Studies: No results found.   Scheduled Meds: . sodium chloride   Intravenous Once  . apixaban  5 mg Per Tube BID  . arformoterol  15 mcg Nebulization BID  . chlorhexidine gluconate (MEDLINE KIT)  15 mL Mouth Rinse BID  . Chlorhexidine Gluconate Cloth  6 each Topical Daily  . clonazePAM  1 mg Per Tube BID  . feeding supplement (KATE FARMS STANDARD 1.4)  325 mL Per Tube QID  . fentaNYL  1 patch Transdermal Q72H  . free water  120 mL Per Tube QID  . gabapentin  300 mg Per Tube TID  . glycopyrrolate  1 mg Per Tube BID  . insulin aspart  0-6 Units Subcutaneous Q4H  . mouth rinse  15 mL Mouth Rinse 10 times per day  . metoprolol tartrate  50 mg Per Tube BID  . pantoprazole sodium  40 mg Per Tube QHS  . QUEtiapine  25 mg Per Tube QHS  . sodium chloride flush  10-40 mL Intracatheter Q12H   Continuous Infusions: . sodium chloride Stopped (06/25/20 1842)  . piperacillin-tazobactam (ZOSYN)  IV 3.375 g (07/18/20 1338)     LOS: 42 days    Time spent: 25 mins    Beren Yniguez, MD [facility]   If 7PM-7AM, please contact night-coverage

## 2020-07-18 NOTE — Progress Notes (Signed)
ID Patient doing better Diarrhea has improved since the tube feeds have been changed. Respiration is better No fever  BP 119/75 (BP Location: Left Arm)   Pulse 87   Temp 99.2 F (37.3 C) (Oral)   Resp (!) 31   Ht 5' 2.01" (1.575 m)   Wt 54.9 kg   SpO2 97%   BMI 22.13 kg/m    Awake and alert Tracheostomy on the vent Chest bilateral air entry few rhonchi Heart sounds S1-S2 Abdomen soft has a PEG tube  CNS nonfocal  Current meds Piperacillin tazobactam Metoprolol As needed diazepam Seroquel Insulin sliding scale Glycopyrrolate tablet Zofran as needed Protonix Eliquis    Labs CBC Latest Ref Rng & Units 07/18/2020 07/17/2020 07/16/2020  WBC 4.0 - 10.5 K/uL 8.3 9.1 6.6  Hemoglobin 12.0 - 15.0 g/dL 8.3(L) 8.5(L) 7.7(L)  Hematocrit 36.0 - 46.0 % 27.8(L) 28.3(L) 25.6(L)  Platelets 150 - 400 K/uL 301 317 291    CMP Latest Ref Rng & Units 07/18/2020 07/17/2020 07/16/2020  Glucose 70 - 99 mg/dL 92 90 91  BUN 8 - 23 mg/dL 17 19 23   Creatinine 0.44 - 1.00 mg/dL 0.10 9.32  Sodium 135 - 145 mmol/L 141 143 143  Potassium 3.5 - 5.1 mmol/L 4.2 4.0 3.5  Chloride 98 - 111 mmol/L 95(L) 96(L) 96(L)  CO2 22 - 32 mmol/L 37(H) 36(H) 34(H)  Calcium 8.9 - 10.3 mg/dL 3.55) 7.3(U) 2.0(U)  Total Protein 6.5 - 8.1 g/dL - - -  Total Bilirubin 0.3 - 1.2 mg/dL - - -  Alkaline Phos 38 - 126 U/L - - -  AST 15 - 41 U/L - - -  ALT 0 - 44 U/L - - -   Impression recommendation COPD with respiratory failure leading to intubation and now has tracheostomy  Aspiration pneumonia and patient has been on Zosyn since 07/14/2020.  We will discontinue it today.  Enterobacter in tracheal aspirate likely colonization but patient has completed antibiotic treatment with Zosyn.   Encephalopathy secondary to CO2 narcosis/metabolic acidosis has resolved   Diarrhea due to tube feeds.  Jevity, Osmolite both cause bloating and diarrhea and she had been refusing to take it.  Osmolite was changed to K form  feeds yesterday and the diarrhea is improved. No fever no WBC no abdominal pain no tenderness to suggest C. difficile diarrhea and hence test was not done.  Anemia  Discussed the management with the patient and her nurse and care team. ID will sign off call if needed.  Note this document was prepared using Dragon voice recognition software and may include unintentional dictation errors

## 2020-07-18 NOTE — Progress Notes (Signed)
PHARMACIST - PHYSICIAN COMMUNICATION   CONCERNING: IV to Oral Route Change Policy  RECOMMENDATION: This patient is receiving pantoprazole by the intravenous route.  Based on criteria approved by the Pharmacy and Therapeutics Committee, the intravenous medication(s) is/are being converted to the equivalent oral dose form(s).   DESCRIPTION: These criteria include:  The patient is eating (either orally or via tube) and/or has been taking other orally administered medications for a least 24 hours  The patient has no evidence of active gastrointestinal bleeding or impaired GI absorption (gastrectomy, short bowel, patient on TNA or NPO).  If you have questions about this conversion, please contact the Pharmacy Department   Tressie Ellis, [facility] 07/18/2020 3:32 PM

## 2020-07-18 NOTE — Progress Notes (Signed)
OT Cancellation Note  Patient Details Name: Virginia [name] MRN: [id number] DOB: [birth year]   Cancelled Treatment:    Reason Eval/Treat Not Completed: Patient declined, no reason specified;Other (comment). OT attempted to see pt for tx session this date. Upon arrival to pt room, pt side lying in bed with HOB nearly flat. Pt politely declines therapy services this date. Author attempts to educate pt on importance of functional activity during hospital stay. Pt continues to decline activity this date stating she would "like to know ahead of time" prior to her next therapy session. Pt agreeable to OT tx session next date. Will re-attempt next date as available and pt medically appropriate for OT services.   Rockney Ghee, M.S., OTR/L Ascom: [phone] 07/18/20, 3:06 PM

## 2020-07-19 DIAGNOSIS — R338 Other retention of urine: Secondary | ICD-10-CM | POA: Diagnosis not present

## 2020-07-19 DIAGNOSIS — I480 Paroxysmal atrial fibrillation: Secondary | ICD-10-CM | POA: Diagnosis not present

## 2020-07-19 DIAGNOSIS — J9621 Acute and chronic respiratory failure with hypoxia: Secondary | ICD-10-CM | POA: Diagnosis not present

## 2020-07-19 DIAGNOSIS — G9341 Metabolic encephalopathy: Secondary | ICD-10-CM | POA: Diagnosis not present

## 2020-07-19 LAB — CBC
HCT: 28.7 % — ABNORMAL LOW (ref 36.0–46.0)
Hemoglobin: 8.4 g/dL — ABNORMAL LOW (ref 12.0–15.0)
MCH: 28.8 pg (ref 26.0–34.0)
MCHC: 29.3 g/dL — ABNORMAL LOW (ref 30.0–36.0)
MCV: 98.3 fL (ref 80.0–100.0)
Platelets: 308 10*3/uL (ref 150–400)
RBC: 2.92 MIL/uL — ABNORMAL LOW (ref 3.87–5.11)
RDW: 15.9 % — ABNORMAL HIGH (ref 11.5–15.5)
WBC: 9.1 10*3/uL (ref 4.0–10.5)
nRBC: 0 % (ref 0.0–0.2)

## 2020-07-19 LAB — BASIC METABOLIC PANEL
Anion gap: 6 (ref 5–15)
BUN: 14 mg/dL (ref 8–23)
CO2: 41 mmol/L — ABNORMAL HIGH (ref 22–32)
Calcium: 8.8 mg/dL — ABNORMAL LOW (ref 8.9–10.3)
Chloride: 97 mmol/L — ABNORMAL LOW (ref 98–111)
Creatinine, Ser: 0.46 mg/dL (ref 0.44–1.00)
GFR, Estimated: >60 mL/min (ref >60)
Glucose, Bld: 89 mg/dL (ref 70–99)
Potassium: 4.3 mmol/L (ref 3.5–5.1)
Sodium: 144 mmol/L (ref 135–145)

## 2020-07-19 LAB — GLUCOSE, CAPILLARY
Glucose-Capillary: 84 mg/dL (ref 70–99)
Glucose-Capillary: 82 mg/dL (ref 70–99)
Glucose-Capillary: 83 mg/dL (ref 70–99)
Glucose-Capillary: 116 mg/dL — ABNORMAL HIGH (ref 70–99)
Glucose-Capillary: 107 mg/dL — ABNORMAL HIGH (ref 70–99)
Glucose-Capillary: 98 mg/dL (ref 70–99)
Glucose-Capillary: 83 mg/dL (ref 70–99)

## 2020-07-19 LAB — MAGNESIUM: Magnesium: 2.1 mg/dL (ref 1.7–2.4)

## 2020-07-19 LAB — PHOSPHORUS: Phosphorus: 2.9 mg/dL (ref 2.5–4.6)

## 2020-07-19 MED ORDER — NUTRISOURCE FIBER PO PACK
1.0000 | PACK | Freq: Two times a day (BID) | ORAL | Status: DC
  Administered 2020-07-19 – 2020-07-21 (×5): 1
  Filled 2020-07-19 (×7): qty 1

## 2020-07-19 NOTE — NC FL2 (Addendum)
[place] LEVEL OF CARE SCREENING TOOL     IDENTIFICATION  Patient Name: Virginia [name] Birthdate: 21-Apr-1951 Sex: female Admission Date (Current Location): 06/06/2020  [place] and [place] Number:  [name] [id number] [place] and Address:  [facility], [address] [place], [place] [address]      Provider Number: [id number]  Attending Physician Name and Address:  Max Sane, MD  Relative Name and Phone Number:  [name] Relative [phone]    Current Level of Care: Hospital Recommended Level of Care: [place] Prior Approval Number:    Date Approved/Denied:   PASRR Number:  Pending  Discharge Plan: SNF    Current Diagnoses: Patient Active Problem List   Diagnosis Date Noted  . Pressure injury of skin 07/14/2020  . Diarrhea   . Reactive thrombocytosis 07/12/2020  . Aspiration pneumonia of both lower lobes due to gastric secretions ([place]) 07/07/2020  . Acute urinary retention 07/06/2020  . Acute metabolic encephalopathy 67/34/1937  . Acute on chronic respiratory failure with hypoxia and hypercapnia ([place]) 07/06/2020  . COPD exacerbation ([place])   . Respiratory failure ([place]) 06/06/2020  . Acute respiratory failure with hypoxia ([place]) 06/02/2020  . COPD with acute exacerbation ([place]) 06/02/2020  . Shortness of breath 06/02/2020  . GERD (gastroesophageal reflux disease) 06/02/2020  . Iron deficiency anemia 01/22/2020  . Anemia of chronic disease 01/17/2019  . Coronary artery disease involving native coronary artery of native heart 04/30/2018  . Palpitations 04/30/2018  . Chronic heartburn 04/12/2018  . Dysphagia 04/12/2018  . Thoracic aortic aneurysm without rupture ([place]) 08/27/2017  . 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 SOBOE (shortness of breath on exertion) 01/24/2017  . AF (paroxysmal atrial fibrillation) ([place]) 11/09/2016  . Chronic hypoxemic respiratory failure ([place])  12/06/2015  . Depression with anxiety 12/06/2015  . Fibromyalgia 12/06/2015  . Hep C w/o coma, chronic ([place]) 12/06/2015  . History of tracheostomy 12/06/2015  . HTN, goal below 140/80 12/06/2015  . Localized osteoporosis without current pathological fracture 12/06/2015  . PEG (percutaneous endoscopic gastrostomy) status ([place]) 12/06/2015  . Peripheral neuropathy, idiopathic 12/06/2015  . RLS (restless legs syndrome) 12/06/2015  . Substance abuse in remission ([place]) 12/06/2015  . COPD with acute lower respiratory infection ([place]) 11/29/2015    Orientation RESPIRATION BLADDER Height & Weight     Self,Place,Situation  Normal Incontinent Weight: 121 lb 0.5 oz (54.9 kg) Height:  5' 2.01" (157.5 cm)  BEHAVIORAL SYMPTOMS/MOOD NEUROLOGICAL BOWEL NUTRITION STATUS      Incontinent Feeding tube  AMBULATORY STATUS COMMUNICATION OF NEEDS Skin   Extensive Assist Verbally Normal                       Personal Care Assistance Level of Assistance  Bathing,Feeding,Dressing,Total care Bathing Assistance: Maximum assistance Feeding assistance: Maximum assistance (PEG Tube) Dressing Assistance: Limited assistance Total Care Assistance: Maximum assistance   Functional Limitations Info  Sight,Hearing,Speech Sight Info: Adequate Hearing Info: Adequate Speech Info: Impaired    SPECIAL CARE FACTORS FREQUENCY     PT Minimum 2X per week                  Contractures Contractures Info: Not present    Additional Factors Info                  Current Medications (07/19/2020):  This is the current hospital active medication list Current Facility-Administered Medications  Medication Dose Route Frequency Provider Last Rate Last Admin  .  0.9 %  sodium chloride infusion (Manually program via Guardrails IV Fluids)   Intravenous Once Rust-Chester, Britton L, NP      . 0.9 %  sodium chloride infusion  250 mL Intravenous Continuous Duffy Bruce, MD   Stopped at 06/25/20 1842  .  acetaminophen (TYLENOL) 160 MG/5ML solution 650 mg  650 mg Per Tube Q4H PRN Flora Lipps, MD   650 mg at 07/19/20 1048  . apixaban (ELIQUIS) tablet 5 mg  5 mg Per Tube BID Hart Robinsons A, RPH   5 mg at 07/19/20 1013  . arformoterol (BROVANA) nebulizer solution 15 mcg  15 mcg Nebulization BID Candee Furbish, MD   15 mcg at 07/18/20 2037  . chlorhexidine gluconate (MEDLINE KIT) (PERIDEX) 0.12 % solution 15 mL  15 mL Mouth Rinse BID Sharen Hones, MD   15 mL at 07/19/20 0813  . Chlorhexidine Gluconate Cloth 2 % PADS 6 each  6 each Topical Daily Flora Lipps, MD   6 each at 07/19/20 1013  . clonazePAM (KLONOPIN) tablet 1 mg  1 mg Per Tube BID Candee Furbish, MD   1 mg at 07/19/20 1013  . diazepam (VALIUM) tablet 5 mg  5 mg Per Tube Q6H PRN Awilda Bill, NP   5 mg at 07/18/20 0316  . feeding supplement (KATE FARMS STANDARD 1.4) liquid 325 mL  325 mL Per Tube QID Val Riles, MD   325 mL at 07/19/20 1432  . fentaNYL (DURAGESIC) 50 MCG/HR 1 patch  1 patch Transdermal Q72H Tyler Pita, MD   1 patch at 07/17/20 1814  . fiber (NUTRISOURCE FIBER) 1 packet  1 packet Per Tube BID Max Sane, MD   1 packet at 07/19/20 1432  . free water 120 mL  120 mL Per Tube QID Val Riles, MD   120 mL at 07/19/20 1432  . gabapentin (NEURONTIN) capsule 300 mg  300 mg Per Tube TID Tyler Pita, MD   300 mg at 07/19/20 1616  . glycopyrrolate (ROBINUL) tablet 1 mg  1 mg Per Tube BID Ottie Glazier, MD   1 mg at 07/19/20 1013  . insulin aspart (novoLOG) injection 0-6 Units  0-6 Units Subcutaneous Q4H Dallie Piles, RPH   1 Units at 07/14/20 0017  . iohexol (OMNIPAQUE) 350 MG/ML injection 75 mL  75 mL Intravenous Once PRN Sharen Hones, MD      . ipratropium-albuterol (DUONEB) 0.5-2.5 (3) MG/3ML nebulizer solution 3 mL  3 mL Nebulization Q6H PRN Awilda Bill, NP   3 mL at 07/13/20 0611  . labetalol (NORMODYNE) injection 10 mg  10 mg Intravenous Q4H PRN Tyna Jaksch, MD   10 mg at 06/28/20 1791  .  MEDLINE mouth rinse  15 mL Mouth Rinse 10 times per day Sharen Hones, MD   15 mL at 07/19/20 1616  . metoprolol tartrate (LOPRESSOR) injection 5 mg  5 mg Intravenous Q3H PRN Ottie Glazier, MD   5 mg at 06/17/20 2006  . metoprolol tartrate (LOPRESSOR) tablet 50 mg  50 mg Per Tube BID Candee Furbish, MD   50 mg at 07/19/20 1013  . naphazoline-glycerin (CLEAR EYES REDNESS) ophth solution 1-2 drop  1-2 drop Both Eyes QID PRN Sharen Hones, MD   2 drop at 07/12/20 1040  . ondansetron (ZOFRAN) injection 4 mg  4 mg Intravenous Q6H PRN Flora Lipps, MD   4 mg at 07/19/20 1511  . pantoprazole sodium (PROTONIX) 40 mg/20 mL oral suspension 40 mg  40 mg Per Tube QHS Benita Gutter, RPH   40 mg at 07/18/20 2153  . QUEtiapine (SEROQUEL) tablet 25 mg  25 mg Per Tube QHS Tyler Pita, MD   25 mg at 07/18/20 2050  . sodium chloride flush (NS) 0.9 % injection 10-40 mL  10-40 mL Intracatheter Q12H Tyler Pita, MD   30 mL at 07/19/20 1014  . sodium chloride flush (NS) 0.9 % injection 10-40 mL  10-40 mL Intracatheter PRN Tyler Pita, MD   10 mL at 06/30/20 2104     Discharge Medications: Please see discharge summary for a list of discharge medications.  Relevant Imaging Results:  Relevant Lab Results:   Additional Information SS# [id number]  Adelene Amas, LCSWA

## 2020-07-19 NOTE — Progress Notes (Addendum)
PROGRESS NOTE    DRAKE LANDING  MBE:[id number] DOB: [birth year] DOA: 06/06/2020 PCP: Idelle Crouch, MD    Brief Narrative:  This 70 years old female with severe COPD admitted with acute on chronic hypoxic and hypercapnic respiratory failure and acute encephalopathy secondary to narcotic use and advanced COPD requiring mechanical ventilation.  She was treated with steroids and IV antibiotics.  She could not be liberated from the ventilator so tracheostomy was performed and she has been receiving oxygen via trach collar.  Patient developed worsening hypoxia on 1/15 chest x-ray shows bilateral lower lobe infiltrate consistent with aspiration pneumonia.  She has completed antibiotics.  On 1/20 patient had a sudden onset of altered mental status,  ABG shows severe metabolic acidosis.  Patient was transferred to ICU. PCCM pickup 1/24: Acute encephalopathy resolved.  CT head unremarkable for any acute stroke or bleeding.  Mental status normalized without any focal deficit. COPD appears stable.  1/26 -> transfer to med-surg. Waiting for LTAC decision  Assessment & Plan:   Active Problems:   AF (paroxysmal atrial fibrillation) (HCC)   Anemia of chronic disease   COPD with acute lower respiratory infection ([place])   Dysphagia   Respiratory failure (HCC)   COPD exacerbation (HCC)   Acute urinary retention   Acute metabolic encephalopathy   Acute on chronic respiratory failure with hypoxia and hypercapnia (HCC)   Aspiration pneumonia of both lower lobes due to gastric secretions (HCC)   Reactive thrombocytosis   Pressure injury of skin   Diarrhea   Acute on chronic respiratory failure with hypoxemia and hypercapnia. COPD exacerbation: Stable. Aspiration pneumonia :  Completed IV zosyn on 1/25 Continue Zosyn for aspiration pneumonia,  procalcitonin trending down. Repeat ABG shows decreased PCO2. Infectious Diseases signed off. She is on trach collar, O2 sats 97% on 8 L, wean as  able  Acute Metabolic Encephalopathy:  >>>.Resolved. Patient had a sudden onset of slurred speech and altered mental status. Differential including mucous plug, aspiration pneumonia versus acute intracranial hemorrhage. Stat ABG showed pH of 7.0.  Patient was not responding, code stroke called. CT scan ruled out intracranial hemorrhage. Patient does not have hypoglycemia. Patient was transferred to ICU. She is back to her baseline mental status. Stopping Narcotics one at a time (stopped norco today) and will try to taper her off Fentanyl  Severe metabolic acidosis>> resolved Etiology still unclear, it appears to be sudden onset.  Diarrhea: >>> Resolved Patient has been on antibiotics but no leukocytosis or abdominal pain,   ID recommended no need for C. difficile testing.  Dysphagia. Deconditioning Moderate protein calorie malnutrition. On TFs and tolerating  Anxiety :  continue Klonopin.  Paroxysmal A. Fib:  Continue Eliquis, normal sinus rhythm. Heart rate controlled.   DVT prophylaxis: Eliquis  Code Status: Full code Family Communication: No family at bedside Disposition Plan: ALTC vs SNF  Status is: Inpatient  Transfer to med-surg  Remains inpatient appropriate because:Inpatient level of care appropriate due to severity of illness   Dispo: The patient is from: Home              Anticipated d/c is to: SNF/ALTC              Anticipated d/c date is: > 3 days              Patient currently is not medically stable to d/c.TOC team working on placement (ALTC vs SNF)   Difficult to place patient Yes    Consultants:  PCCM  Procedures:  Antimicrobials:  Anti-infectives (From admission, onward)   Start     Dose/Rate Route Frequency Ordered Stop   07/14/20 1100  piperacillin-tazobactam (ZOSYN) IVPB 3.375 g        3.375 g 12.5 mL/hr over 240 Minutes Intravenous Every 8 hours 07/14/20 0946 07/18/20 2359   07/08/20 1145  amoxicillin-clavulanate (AUGMENTIN) 875-125  MG per tablet 1 tablet  Status:  Discontinued        1 tablet Per Tube Every 12 hours 07/08/20 1055 07/11/20 0803   07/06/20 1530  piperacillin-tazobactam (ZOSYN) IVPB 3.375 g  Status:  Discontinued        3.375 g 12.5 mL/hr over 240 Minutes Intravenous Every 8 hours 07/06/20 1433 07/08/20 1055   06/21/20 1200  ceFAZolin (ANCEF) IVPB 2g/100 mL premix       Note to Pharmacy: Please have at bedside. Will be administered one hour prior to procedure. Endo staff will instruct on when to start infusion.   2 g 200 mL/hr over 30 Minutes Intravenous  Once 06/21/20 0820 06/21/20 2030   06/07/20 1400  azithromycin (ZITHROMAX) 500 mg in sodium chloride 0.9 % 250 mL IVPB  Status:  Discontinued        500 mg 250 mL/hr over 60 Minutes Intravenous Every 24 hours 06/06/20 2030 06/06/20 2044   06/07/20 1400  azithromycin (ZITHROMAX) 500 mg in sodium chloride 0.9 % 250 mL IVPB  Status:  Discontinued        500 mg 250 mL/hr over 60 Minutes Intravenous Every 24 hours 06/06/20 2044 06/08/20 1031   06/06/20 1415  cefTRIAXone (ROCEPHIN) 2 g in sodium chloride 0.9 % 100 mL IVPB        2 g 200 mL/hr over 30 Minutes Intravenous  Once 06/06/20 1407 06/06/20 1514   06/06/20 1415  azithromycin (ZITHROMAX) 500 mg in sodium chloride 0.9 % 250 mL IVPB        500 mg 250 mL/hr over 60 Minutes Intravenous  Once 06/06/20 1407 06/06/20 1708     Subjective: Patient was seen and examined at bedside.  Overnight events noted.   She seems alert and oriented,  following commands.  Reports feeling much better.  Objective: Vitals:   07/19/20 0400 07/19/20 0700 07/19/20 0800 07/19/20 0900  BP: 91/60 114/66 135/83 (!) 137/97  Pulse: 80 86 93 (!) 101  Resp: (!) 26 (!) 27 (!) 26   Temp: 99 F (37.2 C)  98.6 F (37 C)   TempSrc: Oral  Oral   SpO2: 98% 99% 100% 92%  Weight:      Height:        Intake/Output Summary (Last 24 hours) at 07/19/2020 1058 Last data filed at 07/19/2020 0800 Gross per 24 hour  Intake 62 ml  Output  --  Net 62 ml   Filed Weights   07/16/20 0500 07/17/20 0426 07/18/20 0500  Weight: 56.4 kg 55.4 kg 54.9 kg    Examination:  General exam: Appears calm and comfortable , trach collar noted. Respiratory system: Clear to auscultation. Respiratory effort normal. Cardiovascular system: S1 & S2 heard, RRR. No JVD, murmurs, rubs, gallops or clicks. No pedal edema. Gastrointestinal system: Abdomen is nondistended, soft and nontender. No organomegaly or masses felt. Normal bowel sounds heard.  PEG tube in place Central nervous system: Alert and oriented. No focal neurological deficits. Extremities: Symmetric 5 x 5 power. Skin: No rashes, lesions or ulcers Psychiatry: Judgement and insight appear normal. Mood & affect appropriate.     Data  Reviewed: I have personally reviewed following labs and imaging studies  CBC: Recent Labs  Lab 07/13/20 1104 07/14/20 0340 07/15/20 0433 07/15/20 2042 07/16/20 0342 07/17/20 0427 07/18/20 0521 07/19/20 0430  WBC 13.1* 12.4* 9.5  --  6.6 9.1 8.3 9.1  NEUTROABS 11.4* 8.5*  --   --   --   --   --   --   HGB 7.9* 7.4* 6.5* 8.2* 7.7* 8.5* 8.3* 8.4*  HCT 27.4* 23.8* 21.3* 27.0* 25.6* 28.3* 27.8* 28.7*  MCV 99.3 94.8 94.2  --  94.8 95.9 97.9 98.3  PLT 427* 382 319  --  291 317 301 245   Basic Metabolic Panel: Recent Labs  Lab 07/15/20 0433 07/16/20 0342 07/17/20 0427 07/18/20 0521 07/19/20 0430  NA 138 143 143 141 144  K 3.7 3.5 4.0 4.2 4.3  CL 94* 96* 96* 95* 97*  CO2 33* 34* 36* 37* 41*  GLUCOSE 93 91 90 92 89  BUN 26* '23 19 17 14  ' CREATININE 1.34* 0.91 0.68 0.58 0.46  CALCIUM 8.9 8.7* 8.8* 8.6* 8.8*  MG 1.9 2.1 1.9 1.8 2.1  PHOS 5.5* 5.8* 3.1 2.4* 2.9   GFR: Estimated Creatinine Clearance: 52.5 mL/min (by C-G formula based on SCr of 0.46 mg/dL). Liver Function Tests: Recent Labs  Lab 07/13/20 1104 07/16/20 0342 07/17/20 0427  AST 31  --   --   ALT 23  --   --   ALKPHOS 19*  --   --   BILITOT 0.3  --   --   PROT 6.5  --   --    ALBUMIN 2.9* 2.5* 2.7*   No results for input(s): LIPASE, AMYLASE in the last 168 hours. No results for input(s): AMMONIA in the last 168 hours. Coagulation Profile: Recent Labs  Lab 07/13/20 1104  INR 1.3*   Cardiac Enzymes: No results for input(s): CKTOTAL, CKMB, CKMBINDEX, TROPONINI in the last 168 hours. BNP (last 3 results) No results for input(s): PROBNP in the last 8760 hours. HbA1C: No results for input(s): HGBA1C in the last 72 hours. CBG: Recent Labs  Lab 07/18/20 1934 07/18/20 2350 07/19/20 0341 07/19/20 0457 07/19/20 0741  GLUCAP 109* 93 84 82 83   Lipid Profile: No results for input(s): CHOL, HDL, LDLCALC, TRIG, CHOLHDL, LDLDIRECT in the last 72 hours. Thyroid Function Tests: No results for input(s): TSH, T4TOTAL, FREET4, T3FREE, THYROIDAB in the last 72 hours. Anemia Panel: No results for input(s): VITAMINB12, FOLATE, FERRITIN, TIBC, IRON, RETICCTPCT in the last 72 hours. Sepsis Labs: Recent Labs  Lab 07/13/20 1104 07/13/20 1507 07/13/20 1708 07/14/20 0340 07/15/20 0433  PROCALCITON  --  0.13  --  0.28 0.18  LATICACIDVEN 1.8 2.2* 1.4  --   --     Recent Results (from the past 240 hour(s))  Culture, respiratory (non-expectorated)     Status: None   Collection Time: 07/13/20  4:20 PM   Specimen: Tracheal Aspirate; Respiratory  Result Value Ref Range Status   Specimen Description   Final    TRACHEAL ASPIRATE Performed at [facility], [address], [place], [place] [address]    Special Requests   Final    NONE Performed at [facility], [place], [place] [address]    Gram Stain NO WBC SEEN ABUNDANT GRAM POSITIVE RODS   Final   Culture   Final    FEW ENTEROBACTER AEROGENES MODERATE DIPHTHEROIDS(CORYNEBACTERIUM SPECIES) Standardized susceptibility testing for this organism is not available. Performed at [facility], [place] [address], [place], [place] [address]    Report Status 07/17/2020 FINAL   Final   Organism ID, Bacteria ENTEROBACTER AEROGENES  Final      Susceptibility   Enterobacter aerogenes - MIC*    CEFAZOLIN >=64 RESISTANT Resistant     CEFEPIME <=0.12 SENSITIVE Sensitive     CEFTAZIDIME <=1 SENSITIVE Sensitive     CEFTRIAXONE <=0.25 SENSITIVE Sensitive     CIPROFLOXACIN <=0.25 SENSITIVE Sensitive     GENTAMICIN <=1 SENSITIVE Sensitive     IMIPENEM 1 SENSITIVE Sensitive     TRIMETH/SULFA <=20 SENSITIVE Sensitive     PIP/TAZO <=4 SENSITIVE Sensitive     * FEW ENTEROBACTER AEROGENES    Radiology Studies: No results found.   Scheduled Meds: . sodium chloride   Intravenous Once  . apixaban  5 mg Per Tube BID  . arformoterol  15 mcg Nebulization BID  . chlorhexidine gluconate (MEDLINE KIT)  15 mL Mouth Rinse BID  . Chlorhexidine Gluconate Cloth  6 each Topical Daily  . clonazePAM  1 mg Per Tube BID  . feeding supplement (KATE FARMS STANDARD 1.4)  325 mL Per Tube QID  . fentaNYL  1 patch Transdermal Q72H  . free water  120 mL Per Tube QID  . gabapentin  300 mg Per Tube TID  . glycopyrrolate  1 mg Per Tube BID  . insulin aspart  0-6 Units Subcutaneous Q4H  . mouth rinse  15 mL Mouth Rinse 10 times per day  . metoprolol tartrate  50 mg Per Tube BID  . pantoprazole sodium  40 mg Per Tube QHS  . QUEtiapine  25 mg Per Tube QHS  . sodium chloride flush  10-40 mL Intracatheter Q12H   Continuous Infusions: . sodium chloride Stopped (06/25/20 1842)     LOS: 43 days    Time spent: 25 mins    Jayzon Taras Manuella Ghazi, MD [facility]   If 7PM-7AM, please contact night-coverage

## 2020-07-19 NOTE — TOC Progression Note (Addendum)
Transition of Care [facility]) - Progression Note    Patient Details  Name: Virginia [name] MRN: [id number] Date of Birth: July 21, 1950  Transition of [facility] CM/SW Contact  Joseph Art, [place] Phone Number: 07/19/2020, 4:23 PM  Clinical Narrative:     [facility] declined to take patient.  PT recommended SNF. Patient's daughter-in-law [name] [phone], expressed concerns about the patient going to SNF and receiving appropiate rehab for trach and vent.  Attending notified about Ms. Walker's concerns.  [place] [facility] has patient's information under different last night.  CSW needs to confirm patient's legal name, will reach out to patient and family.      Barriers to Discharge: Continued Medical Work up  Expected Discharge Plan and Services   In-house Referral: Clinical Social Work,Hospice / Palliative Care   Post Acute Care Choice: Durable Medical Equipment (Oxygen 4L) Living arrangements for the past 2 months: Apartment                                       Social Determinants of Health (SDOH) Interventions    Readmission Risk Interventions No flowsheet data found.

## 2020-07-19 NOTE — Progress Notes (Signed)
Physical Therapy Treatment Patient Details Name: Virginia [name] MRN: [id number] DOB: [birth year] Today's Date: 07/19/2020    History of Present Illness 70 yo female with severe COPD admitted with acute on chronic hypoxic hypercapnic respiratory failure and acute encephalopathy secondary to narcotic use and AECOPD requiring mechanical intubation. Initially intubated from 12/14-12/16 and then again from 12/18-12/23. Now with + trach collar. Recent hospitalization from 12/10-12/12/21. RR secondary to slurred speech and AMS. Transferred to CCU on 1/20 on full vent support. RE-evaluation performed this date as medical status has improved.    PT Comments    Patient received in bed, agrees to PT session. Patient does not want to use paper and pen to communicate. She is able to mouth words and I am able to understand her. She performed supine to sit with mod independence, sit to supine with min assist. Transfers with min assist. Ambulated near bed with hand held assist. Distance limited due to O2 needs and weakness. LE exercises performed. Patient will continue to benefit from skilled PT while here to improve strength, activity tolerance and balance.    Follow Up Recommendations  SNF;LTACH     Equipment Recommendations  Other (comment) (TBD)    Recommendations for Other Services       Precautions / Restrictions Precautions Precautions: Fall Restrictions Weight Bearing Restrictions: No    Mobility  Bed Mobility Overal bed mobility: Needs Assistance Bed Mobility: Supine to Sit;Sit to Supine     Supine to sit: Supervision Sit to supine: Min assist   General bed mobility comments: use of bed rails  Transfers Overall transfer level: Needs assistance Equipment used: 1 person hand held assist Transfers: Sit to/from Stand Sit to Stand: Min assist         General transfer comment: cues for safety/line mgt  Ambulation/Gait Ambulation/Gait assistance: Min assist Gait Distance  (Feet): 36 Feet Assistive device: 1 person hand held assist Gait Pattern/deviations: Step-to pattern;Decreased stride length;Decreased step length - right;Decreased step length - left Gait velocity: decreased   General Gait Details: able to ambulate forward and back 3 feet x 3 reps, then side stepping along edge of bed 5 feet x 6 reps. O2 saturations > 95% throughout session. Single hand assist to maintain balance. Patient tends to lean posteriorly in standing.   Stairs             Wheelchair Mobility    Modified Rankin (Stroke Patients Only)       Balance Overall balance assessment: Needs assistance Sitting-balance support: Feet supported Sitting balance-Leahy Scale: Good Sitting balance - Comments: close supervision static sitting EOB   Standing balance support: During functional activity;Single extremity supported Standing balance-Leahy Scale: Fair Standing balance comment: requires single UE support for balance and safety. Posterior lean in standing                            Cognition Arousal/Alertness: Awake/alert Behavior During Therapy: WFL for tasks assessed/performed Overall Cognitive Status: Within Functional Limits for tasks assessed                                 General Comments: able to mouth words clearly. calm and cooperative      Exercises Other Exercises Other Exercises: Sit to stand 3x5 reps from recliner. LAQ 2x10, seated marching 2x10 bilaterally. Cues for pacing.    General Comments  Pertinent Vitals/Pain Pain Assessment: No/denies pain    Home Living                      Prior Function            PT Goals (current goals can now be found in the care plan section) Acute Rehab PT Goals Patient Stated Goal: To return home safely PT Goal Formulation: With patient Time For Goal Achievement: 07/30/20 Potential to Achieve Goals: Fair Additional Goals Additional Goal #1: Pt will be able to  perform bed mobility/transfers with mod I and safe technique to improve functional independence Progress towards PT goals: Progressing toward goals    Frequency    Min 2X/week      PT Plan Current plan remains appropriate    Co-evaluation              AM-PAC PT "6 Clicks" Mobility   Outcome Measure  Help needed turning from your back to your side while in a flat bed without using bedrails?: None Help needed moving from lying on your back to sitting on the side of a flat bed without using bedrails?: A Little Help needed moving to and from a bed to a chair (including a wheelchair)?: A Little Help needed standing up from a chair using your arms (e.g., wheelchair or bedside chair)?: A Little Help needed to walk in hospital room?: A Lot Help needed climbing 3-5 steps with a railing? : Total 6 Click Score: 16    End of Session Equipment Utilized During Treatment: Oxygen Activity Tolerance: Patient tolerated treatment well Patient left: in bed;with call bell/phone within reach Nurse Communication: Mobility status PT Visit Diagnosis: Unsteadiness on feet (R26.81);Muscle weakness (generalized) (M62.81);Difficulty in walking, not elsewhere classified (R26.2)     Time: 1350-1426 PT Time Calculation (min) (ACUTE ONLY): 36 min  Charges:  $Gait Training: 23-37 mins $Therapeutic Exercise: 23-37 mins                     Timonthy Hovater, PT, GCS 07/19/20,2:36 PM

## 2020-07-20 DIAGNOSIS — R338 Other retention of urine: Secondary | ICD-10-CM | POA: Diagnosis not present

## 2020-07-20 DIAGNOSIS — J9621 Acute and chronic respiratory failure with hypoxia: Secondary | ICD-10-CM | POA: Diagnosis not present

## 2020-07-20 DIAGNOSIS — G9341 Metabolic encephalopathy: Secondary | ICD-10-CM | POA: Diagnosis not present

## 2020-07-20 DIAGNOSIS — I480 Paroxysmal atrial fibrillation: Secondary | ICD-10-CM | POA: Diagnosis not present

## 2020-07-20 LAB — GLUCOSE, CAPILLARY
Glucose-Capillary: 104 mg/dL — ABNORMAL HIGH (ref 70–99)
Glucose-Capillary: 105 mg/dL — ABNORMAL HIGH (ref 70–99)
Glucose-Capillary: 112 mg/dL — ABNORMAL HIGH (ref 70–99)
Glucose-Capillary: 79 mg/dL (ref 70–99)
Glucose-Capillary: 86 mg/dL (ref 70–99)
Glucose-Capillary: 86 mg/dL (ref 70–99)
Glucose-Capillary: 89 mg/dL (ref 70–99)

## 2020-07-20 MED ORDER — GLYCOPYRROLATE 1 MG PO TABS
1.0000 mg | ORAL_TABLET | Freq: Three times a day (TID) | ORAL | Status: DC
  Administered 2020-07-20 – 2020-07-27 (×22): 1 mg
  Filled 2020-07-20 (×23): qty 1

## 2020-07-20 MED ORDER — GLYCOPYRROLATE 1 MG PO TABS
1.0000 mg | ORAL_TABLET | Freq: Three times a day (TID) | ORAL | Status: DC
  Administered 2020-07-20: 1 mg via ORAL
  Filled 2020-07-20 (×2): qty 1

## 2020-07-20 NOTE — Progress Notes (Signed)
PULMONARY PROGRESS NOTE    Name: Virginia [name] MRN: [id number] DOB: 11-17-1950     LOS: 33   SUBJECTIVE FINDINGS & SIGNIFICANT EVENTS    Patient description:  70 yo female with severe COPD admitted with acute on chronic hypoxic hypercapnic respiratory failure and acute encephalopathy secondary to narcotic use and AECOPD requiring mechanical intubation  Now s/p trach/PEG working on trach weaning.  On TC at present, anxious. Remains on precedex.  06/27/20- patient is improved.  Signed out to [facility] - Dr Jimmye Norman for pick up in am.   06/29/20- patient is with respiratory distress, she has high volume phlegm from trache, ive suctioned out whats possible via [place] and messaged RT to assist.  RN was able to come in and evaluate patient and she was able to communicate appropriately.   07/07/20- patient is in bed resting. She is clinically better today then yesterday. Resting in bed in no distress.  07/08/20- Patient is lucid and awake.  Son is at bedside we discussed care plan.  CXR in am.   1/16-17/22- patient resting in bed comfortbably no acute events overnight.   07/11/20- patient had episode of mild respiratory distress due to voluminous phlegm expectorated via trache. This was suctioned by RN with recovery. Patient is clear to auscultation with occasional low pitch wheezes.  She is being optimized for d/c.   07/20/20- patient had episode of mild-moderate respiratory distress due to obstructed trache with inspissated phlegm.  She is on 7-8L/min humidifed HFnc.  She is with ENTEROBACTER AEROGENES on trache aspirate culture. ID is following and there is abscence of leucosytosis or fevers in several days.    PHYSICAL EXAMINATION   Vital Signs: Temp:  [97.7 F (36.5 C)-98.7 F (37.1 C)] 98.7 F (37.1 C) (01/27  1138) Pulse Rate:  [75-118] 102 (01/27 1138) Resp:  [17-27] 20 (01/27 1138) BP: (104-142)/(62-108) 124/91 (01/27 1138) SpO2:  [93 %-100 %] 98 % (01/27 1138) FiO2 (%):  [40 %] 40 % (01/27 0826)   FiO2 (%):  [40 %] 40 %   Constitutional: anxious frail woman lying in bed  Eyes: eomi, pupils equal Ears, nose, mouth, and throat: trach in place, minmal secretions Cardiovascular: RRR,  Ext warm Respiratory: diminished with prolonged expiratory phase, occasional accessory muscle use Gastrointestinal: soft, abd wrapped after PEG, some mild tenderness at PEG insertion site MSK: muscle wasting Skin: No rashes, normal turgor, scattered bruising Neurologic: moves all 4 ext to command, weak Psychiatric: anxious, answering questions appropriately   Scheduled Meds: . sodium chloride   Intravenous Once  . apixaban  5 mg Per Tube BID  . arformoterol  15 mcg Nebulization BID  . chlorhexidine gluconate (MEDLINE KIT)  15 mL Mouth Rinse BID  . Chlorhexidine Gluconate Cloth  6 each Topical Daily  . clonazePAM  1 mg Per Tube BID  . feeding supplement (KATE FARMS STANDARD 1.4)  325 mL Per Tube QID  . fentaNYL  1 patch Transdermal Q72H  . fiber  1 packet Per Tube BID  . free water  120 mL Per Tube QID  . gabapentin  300 mg Per Tube TID  . insulin aspart  0-6 Units Subcutaneous Q4H  . mouth rinse  15 mL Mouth Rinse 10 times per day  . pantoprazole sodium  40 mg Per Tube QHS  . QUEtiapine  25 mg Per Tube QHS  . sodium chloride flush  10-40 mL Intracatheter Q12H   Continuous Infusions: . sodium chloride Stopped (06/25/20 1842)   PRN  Meds:.acetaminophen (TYLENOL) oral liquid 160 mg/5 mL, diazepam, ipratropium-albuterol, labetalol, naphazoline-glycerin, ondansetron (ZOFRAN) IV    ASSESSMENT AND PLAN   Acute on chronic hypoxemic and hypercapnic respiratory failure thought secondary to polypharmacy long wean from ventilator due to muscular deconditioning now s/p tracheostomy.         -Heavy phlegm  production - Glycopyrrolate tid 26m  Dysphagia, moderate protein calorie malnutrition- now s/p PEG  Severe baseline COPD- complicated trach wean - continue COPD carepath  Muscular deconditioning  Chronic anxiety/depression  Metabolic encephalopathy/agitation- improving  - repeat tracheal aspirate ordered -ENTEROBACTER AEROGENES - Switched from standing duonebs from duonebs to brovana/yupelri - PT/OT up to chair - Continue standing clonazepam, use valium per tube for breakthrough - SLP for PMV trials - g off IV meds for rehab vs. SNF vs. LTACH   Patient chronically critically ill due to respiratory failure, metabolic encephalopathy Interventions to address this today weaning precedex, weaning vent Risk of deterioration without these interventions is high    [facility] *This note was dictated using voice recognition software/Dragon.  Despite best efforts to proofread, errors can occur which can change the meaning.  Any change was purely unintentional.     FOttie Glazier M.D.  [place]ulmonary & [place]

## 2020-07-20 NOTE — Care Management Important Message (Signed)
Important Message  Patient Details  Name: Virginia [name] MRN: [id number] Date of Birth: 1950-10-24   Medicare Important Message Given:  Yes     Johnell Comings 07/20/2020, 1:50 PM

## 2020-07-20 NOTE — TOC Progression Note (Signed)
Transition of Care [facility]) - Progression Note    Patient Details  Name: Virginia [name] MRN: [id number] Date of Birth: 22-Jul-1950  Transition [facility]) CM/SW Contact  Maree Krabbe, LCSW Phone Number: 07/20/2020, 3:40 PM  Clinical Narrative:   CSW spoke with pt's daughter. Pt's daughter was inquiring about dispo. Daughter was told that insurance continued to deny auth for [facility]. Pt's daughter proceeded to call [facility] and they told her the last claim was submitted 1/5, that a new claim would have to be submitted. Pt's daughter is requesting [facility] [place]. CSW spoke with Zella Ball in admissions at [facility] and she will look at the referral. Referral faxed to [phone].      Barriers to Discharge: Continued Medical Work up  Expected Discharge Plan and Services   In-house Referral: Clinical Social Work,Hospice / Palliative Care   Post Acute Care Choice: Durable Medical Equipment (Oxygen 4L) Living arrangements for the past 2 months: Apartment                                       Social Determinants of Health (SDOH) Interventions    Readmission Risk Interventions No flowsheet data found.

## 2020-07-20 NOTE — Progress Notes (Signed)
PROGRESS NOTE    Virginia [name]  FYT:[id number] DOB: 1951/05/24 DOA: 06/06/2020 PCP: Idelle Crouch, MD    Brief Narrative:  This 70 years old female with severe COPD admitted with acute on chronic hypoxic and hypercapnic respiratory failure and acute encephalopathy secondary to narcotic use and advanced COPD requiring mechanical ventilation.  She was treated with steroids and IV antibiotics.  She could not be liberated from the ventilator so tracheostomy was performed and she has been receiving oxygen via trach collar.  Patient developed worsening hypoxia on 1/15 chest x-ray shows bilateral lower lobe infiltrate consistent with aspiration pneumonia.  She has completed antibiotics.  On 1/20 patient had a sudden onset of altered mental status,  ABG shows severe metabolic acidosis.  Patient was transferred to ICU. PCCM pickup 1/24: Acute encephalopathy resolved.  CT head unremarkable for any acute stroke or bleeding.  Mental status normalized without any focal deficit. COPD appears stable.  1/26 -> transfer to med-surg. Waiting for LTAC decision 1/27-> LTAC declined.  Waiting for SNF placement  Assessment & Plan:   Active Problems:   AF (paroxysmal atrial fibrillation) (HCC)   Anemia of chronic disease   COPD with acute lower respiratory infection ([place])   Dysphagia   Respiratory failure (HCC)   COPD exacerbation (HCC)   Acute urinary retention   Acute metabolic encephalopathy   Acute on chronic respiratory failure with hypoxia and hypercapnia (HCC)   Aspiration pneumonia of both lower lobes due to gastric secretions (HCC)   Reactive thrombocytosis   Pressure injury of skin   Diarrhea   Acute on chronic respiratory failure with hypoxemia and hypercapnia. COPD exacerbation: Stable. Aspiration pneumonia :  Completed IV zosyn on 1/25 She is on trach collar, O2 sats 97% on 7-8 L, wean as able  He had a transient episode of moderate respiratory distress earlier due to blocked  mucous/obstructed trach.  Now resolved Tracheal aspirate growing Enterobacter aerogenes Glycopyrrolate for secretions  Acute Metabolic Encephalopathy:  >>>.Resolved. Patient had a sudden onset of slurred speech and altered mental status. Differential including mucous plug, aspiration pneumonia versus acute intracranial hemorrhage. Stat ABG showed pH of 7.0.  Patient was not responding, code stroke called. CT scan ruled out intracranial hemorrhage. Patient does not have hypoglycemia. Patient was transferred to ICU. She is back to her baseline mental status. Stopping Narcotics one at a time (stopped norco on 1/26) and will try to taper her off Fentanyl  Severe metabolic acidosis>> resolved Etiology still unclear, it appears to be sudden onset.  Diarrhea: >>> Resolved Patient has been on antibiotics but no leukocytosis or abdominal pain,   ID recommended no need for C. difficile testing.  Dysphagia. Deconditioning Moderate protein calorie malnutrition. On TFs and tolerating  Anxiety :  continue Klonopin.  Paroxysmal A. Fib:  Continue Eliquis, normal sinus rhythm. Heart rate controlled.   DVT prophylaxis: Eliquis  Code Status: Full code Family Communication: No family at bedside Disposition Plan:  SNF  Status is: Inpatient   Remains inpatient appropriate because:Inpatient level of care appropriate due to severity of illness   Dispo: The patient is from: Home              Anticipated d/c is to: SNF              Anticipated d/c date is: > 3 days              Patient currently is not medically stable to d/c.TOC team working on placement/SNF  Difficult to place patient Yes    Consultants:   PCCM  Procedures:  Antimicrobials:  Anti-infectives (From admission, onward)   Start     Dose/Rate Route Frequency Ordered Stop   07/14/20 1100  piperacillin-tazobactam (ZOSYN) IVPB 3.375 g        3.375 g 12.5 mL/hr over 240 Minutes Intravenous Every 8 hours 07/14/20 0946  07/18/20 2359   07/08/20 1145  amoxicillin-clavulanate (AUGMENTIN) 875-125 MG per tablet 1 tablet  Status:  Discontinued        1 tablet Per Tube Every 12 hours 07/08/20 1055 07/11/20 0803   07/06/20 1530  piperacillin-tazobactam (ZOSYN) IVPB 3.375 g  Status:  Discontinued        3.375 g 12.5 mL/hr over 240 Minutes Intravenous Every 8 hours 07/06/20 1433 07/08/20 1055   06/21/20 1200  ceFAZolin (ANCEF) IVPB 2g/100 mL premix       Note to Pharmacy: Please have at bedside. Will be administered one hour prior to procedure. Endo staff will instruct on when to start infusion.   2 g 200 mL/hr over 30 Minutes Intravenous  Once 06/21/20 0820 06/21/20 2030   06/07/20 1400  azithromycin (ZITHROMAX) 500 mg in sodium chloride 0.9 % 250 mL IVPB  Status:  Discontinued        500 mg 250 mL/hr over 60 Minutes Intravenous Every 24 hours 06/06/20 2030 06/06/20 2044   06/07/20 1400  azithromycin (ZITHROMAX) 500 mg in sodium chloride 0.9 % 250 mL IVPB  Status:  Discontinued        500 mg 250 mL/hr over 60 Minutes Intravenous Every 24 hours 06/06/20 2044 06/08/20 1031   06/06/20 1415  cefTRIAXone (ROCEPHIN) 2 g in sodium chloride 0.9 % 100 mL IVPB        2 g 200 mL/hr over 30 Minutes Intravenous  Once 06/06/20 1407 06/06/20 1514   06/06/20 1415  azithromycin (ZITHROMAX) 500 mg in sodium chloride 0.9 % 250 mL IVPB        500 mg 250 mL/hr over 60 Minutes Intravenous  Once 06/06/20 1407 06/06/20 1708     Subjective: Had a transient episode of moderate respiratory distress likely due to mucous plugging.  Now resolved  Objective: Vitals:   07/20/20 0545 07/20/20 0826 07/20/20 1118 07/20/20 1138  BP: 128/86 (!) 142/108 117/86 (!) 124/91  Pulse: 100 (!) 118 (!) 103 (!) 102  Resp: '20 18 19 20  ' Temp: 98.2 F (36.8 C) 97.7 F (36.5 C) 98.7 F (37.1 C) 98.7 F (37.1 C)  TempSrc: Oral Oral Oral Oral  SpO2: 98% 96% 98% 98%  Weight:      Height:        Intake/Output Summary (Last 24 hours) at 07/20/2020  1406 Last data filed at 07/20/2020 0528 Gross per 24 hour  Intake 0 ml  Output 400 ml  Net -400 ml   Filed Weights   07/16/20 0500 07/17/20 0426 07/18/20 0500  Weight: 56.4 kg 55.4 kg 54.9 kg    Examination:  General exam: Appears calm and comfortable , trach collar noted. Respiratory system: Clear to auscultation. Respiratory effort normal. Cardiovascular system: S1 & S2 heard, RRR. No JVD, murmurs, rubs, gallops or clicks. No pedal edema. Gastrointestinal system: Abdomen is nondistended, soft and nontender. No organomegaly or masses felt. Normal bowel sounds heard.  PEG tube in place Central nervous system: Alert and oriented. No focal neurological deficits. Extremities: Symmetric 5 x 5 power. Skin: No rashes, lesions or ulcers Psychiatry: Judgement and insight appear normal. Mood &  affect appropriate.     Data Reviewed: I have personally reviewed following labs and imaging studies  CBC: Recent Labs  Lab 07/14/20 0340 07/15/20 0433 07/15/20 2042 07/16/20 0342 07/17/20 0427 07/18/20 0521 07/19/20 0430  WBC 12.4* 9.5  --  6.6 9.1 8.3 9.1  NEUTROABS 8.5*  --   --   --   --   --   --   HGB 7.4* 6.5* 8.2* 7.7* 8.5* 8.3* 8.4*  HCT 23.8* 21.3* 27.0* 25.6* 28.3* 27.8* 28.7*  MCV 94.8 94.2  --  94.8 95.9 97.9 98.3  PLT 382 319  --  291 317 301 326   Basic Metabolic Panel: Recent Labs  Lab 07/15/20 0433 07/16/20 0342 07/17/20 0427 07/18/20 0521 07/19/20 0430  NA 138 143 143 141 144  K 3.7 3.5 4.0 4.2 4.3  CL 94* 96* 96* 95* 97*  CO2 33* 34* 36* 37* 41*  GLUCOSE 93 91 90 92 89  BUN 26* '23 19 17 14  ' CREATININE 1.34* 0.91 0.68 0.58 0.46  CALCIUM 8.9 8.7* 8.8* 8.6* 8.8*  MG 1.9 2.1 1.9 1.8 2.1  PHOS 5.5* 5.8* 3.1 2.4* 2.9   GFR: Estimated Creatinine Clearance: 52.5 mL/min (by C-G formula based on SCr of 0.46 mg/dL). Liver Function Tests: Recent Labs  Lab 07/16/20 0342 07/17/20 0427  ALBUMIN 2.5* 2.7*   No results for input(s): LIPASE, AMYLASE in the last 168  hours. No results for input(s): AMMONIA in the last 168 hours. Coagulation Profile: No results for input(s): INR, PROTIME in the last 168 hours. Cardiac Enzymes: No results for input(s): CKTOTAL, CKMB, CKMBINDEX, TROPONINI in the last 168 hours. BNP (last 3 results) No results for input(s): PROBNP in the last 8760 hours. HbA1C: No results for input(s): HGBA1C in the last 72 hours. CBG: Recent Labs  Lab 07/19/20 2109 07/20/20 0001 07/20/20 0406 07/20/20 0835 07/20/20 1137  GLUCAP 83 86 89 86 79   Lipid Profile: No results for input(s): CHOL, HDL, LDLCALC, TRIG, CHOLHDL, LDLDIRECT in the last 72 hours. Thyroid Function Tests: No results for input(s): TSH, T4TOTAL, FREET4, T3FREE, THYROIDAB in the last 72 hours. Anemia Panel: No results for input(s): VITAMINB12, FOLATE, FERRITIN, TIBC, IRON, RETICCTPCT in the last 72 hours. Sepsis Labs: Recent Labs  Lab 07/13/20 1507 07/13/20 1708 07/14/20 0340 07/15/20 0433  PROCALCITON 0.13  --  0.28 0.18  LATICACIDVEN 2.2* 1.4  --   --     Recent Results (from the past 240 hour(s))  Culture, respiratory (non-expectorated)     Status: None   Collection Time: 07/13/20  4:20 PM   Specimen: Tracheal Aspirate; Respiratory  Result Value Ref Range Status   Specimen Description   Final    TRACHEAL ASPIRATE Performed at [facility], [address] [place], [place] [address]    Special Requests   Final    NONE Performed at [facility], [place], [place] [address]    Gram Stain NO WBC SEEN ABUNDANT GRAM POSITIVE RODS   Final   Culture   Final    FEW ENTEROBACTER AEROGENES MODERATE DIPHTHEROIDS(CORYNEBACTERIUM SPECIES) Standardized susceptibility testing for this organism is not available. Performed at [facility], [place] [address], [place], [place] [address]    Report Status 07/17/2020 FINAL  Final   Organism ID, Bacteria ENTEROBACTER AEROGENES  Final      Susceptibility   Enterobacter  aerogenes - MIC*    CEFAZOLIN >=64 RESISTANT Resistant     CEFEPIME <=0.12 SENSITIVE Sensitive  CEFTAZIDIME <=1 SENSITIVE Sensitive     CEFTRIAXONE <=0.25 SENSITIVE Sensitive     CIPROFLOXACIN <=0.25 SENSITIVE Sensitive     GENTAMICIN <=1 SENSITIVE Sensitive     IMIPENEM 1 SENSITIVE Sensitive     TRIMETH/SULFA <=20 SENSITIVE Sensitive     PIP/TAZO <=4 SENSITIVE Sensitive     * FEW ENTEROBACTER AEROGENES    Radiology Studies: No results found.   Scheduled Meds: . sodium chloride   Intravenous Once  . apixaban  5 mg Per Tube BID  . arformoterol  15 mcg Nebulization BID  . chlorhexidine gluconate (MEDLINE KIT)  15 mL Mouth Rinse BID  . Chlorhexidine Gluconate Cloth  6 each Topical Daily  . clonazePAM  1 mg Per Tube BID  . feeding supplement (KATE FARMS STANDARD 1.4)  325 mL Per Tube QID  . fentaNYL  1 patch Transdermal Q72H  . fiber  1 packet Per Tube BID  . free water  120 mL Per Tube QID  . gabapentin  300 mg Per Tube TID  . glycopyrrolate  1 mg Oral TID  . insulin aspart  0-6 Units Subcutaneous Q4H  . mouth rinse  15 mL Mouth Rinse 10 times per day  . pantoprazole sodium  40 mg Per Tube QHS  . QUEtiapine  25 mg Per Tube QHS  . sodium chloride flush  10-40 mL Intracatheter Q12H   Continuous Infusions: . sodium chloride Stopped (06/25/20 1842)     LOS: 44 days    Time spent: 25 mins    Inanna Telford Manuella Ghazi, MD [facility]   If 7PM-7AM, please contact night-coverage

## 2020-07-20 NOTE — Plan of Care (Signed)

## 2020-07-20 NOTE — Progress Notes (Signed)
   07/20/20 0826  Assess: MEWS Score  Temp 97.7 F (36.5 C)  BP (!) 142/108  Pulse Rate (!) 118  Resp 18  Level of Consciousness Alert  SpO2 96 %  O2 Device Tracheostomy Collar  O2 Flow Rate (L/min) 10 L/min  FiO2 (%) 40 %  Assess: MEWS Score  MEWS Temp 0  MEWS Systolic 0  MEWS Pulse 2  MEWS RR 0  MEWS LOC 0  MEWS Score 2  MEWS Score Color Yellow  Assess: if the MEWS score is Yellow or Red  Were vital signs taken at a resting state? No (Patient was just deep suctioned by Respiratory Therapist)  Focused Assessment Change from prior assessment (see assessment flowsheet)  Early Detection of Sepsis Score *See Row Information* Low  MEWS guidelines implemented *See Row Information* No, vital signs rechecked (Patient had just gotten deep suctioned by RT)  Notify: Charge Nurse/RN  Name of Charge Nurse/RN Notified Alcario Drought, RN  Date Charge Nurse/RN Notified 07/20/20  Time Charge Nurse/RN Notified [id number]  Notify: Provider  Provider Name/Title Dr. Sherryll Burger  Date Provider Notified 07/20/20

## 2020-07-21 DIAGNOSIS — R338 Other retention of urine: Secondary | ICD-10-CM | POA: Diagnosis not present

## 2020-07-21 DIAGNOSIS — J9621 Acute and chronic respiratory failure with hypoxia: Secondary | ICD-10-CM | POA: Diagnosis not present

## 2020-07-21 DIAGNOSIS — I480 Paroxysmal atrial fibrillation: Secondary | ICD-10-CM | POA: Diagnosis not present

## 2020-07-21 DIAGNOSIS — G9341 Metabolic encephalopathy: Secondary | ICD-10-CM | POA: Diagnosis not present

## 2020-07-21 LAB — GLUCOSE, CAPILLARY
Glucose-Capillary: 109 mg/dL — ABNORMAL HIGH (ref 70–99)
Glucose-Capillary: 80 mg/dL (ref 70–99)
Glucose-Capillary: 88 mg/dL (ref 70–99)
Glucose-Capillary: 90 mg/dL (ref 70–99)
Glucose-Capillary: 92 mg/dL (ref 70–99)

## 2020-07-21 MED ORDER — NUTRISOURCE FIBER PO PACK
1.0000 | PACK | Freq: Three times a day (TID) | ORAL | Status: DC
  Administered 2020-07-21 – 2020-07-27 (×19): 1
  Filled 2020-07-21 (×20): qty 1

## 2020-07-21 MED ORDER — BACID PO TABS
2.0000 | ORAL_TABLET | Freq: Three times a day (TID) | ORAL | Status: DC
  Filled 2020-07-21 (×3): qty 2

## 2020-07-21 NOTE — Progress Notes (Signed)
OT Cancellation Note  Patient Details Name: Virginia [name] MRN: [id number] DOB: 1951-04-07   Cancelled Treatment:    Reason Eval/Treat Not Completed: Patient declined, no reason specified. Upon attempt, pt politely declines OT tx this date. Agreeable to re-attempt at later date/time.   Richrd Prime, MPH, MS, OTR/L ascom [phone] 07/21/20, 12:38 PM

## 2020-07-21 NOTE — Progress Notes (Addendum)
Physical Therapy Treatment Patient Details Name: Virginia [name] MRN: [id number] DOB: [birth year] Today's Date: 07/21/2020    History of Present Illness 70 yo female with severe COPD admitted with acute on chronic hypoxic hypercapnic respiratory failure and acute encephalopathy secondary to narcotic use and AECOPD requiring mechanical intubation. Initially intubated from 12/14-12/16 and then again from 12/18-12/23. Now with + trach collar. Recent hospitalization from 12/10-12/12/21. RR secondary to slurred speech and AMS. Transferred to CCU on 1/20 on full vent support. RE-evaluation performed this date as medical status has improved.    PT Comments    Pt was long sitting in bed asleep upon arriving. She easily awakes and agrees to PT session. MD and RN arrived during session. She was on 10 L trach collar throughout session. Sao2 > 90 % throughout however pt does have elevated HR and RR with OOB activity. HR elevate to 148bpm during ambulation with RR up to 44 BPM. Distances were limited by fatigue and pt having BM on floor. She requested PT/OT use depends in future sessions. She did have BM on toilet after small stool on floor. SOB and fatigue is noted with limited activity. Highly recommend continued skilled PT to address endurance and activity tolerance deficits while improving pt's independence with ADL. Pt would benefit form CIR at DC. Acute PT will continue to closely follow per POC.    Follow Up Recommendations  CIR;Supervision/Assistance - 24 hour;Supervision for mobility/OOB     Equipment Recommendations  Other (comment) (defer to next level of care)    Recommendations for Other Services       Precautions / Restrictions Precautions Precautions: Fall Precaution Comments: trach Restrictions Weight Bearing Restrictions: No Other Position/Activity Restrictions: HR/ RR    Mobility  Bed Mobility Overal bed mobility: Needs Assistance Bed Mobility: Supine to Sit;Sit to  Supine Rolling: Supervision   Supine to sit: Supervision Sit to supine: Min assist   General bed mobility comments: min assist to return to bed form sitting EOB however was able to exit bed with supervision  Transfers Overall transfer level: Needs assistance Equipment used: Rolling walker (2 wheeled) Transfers: Sit to/from Stand Sit to Stand: Min assist         General transfer comment: min assist for safety  Ambulation/Gait Ambulation/Gait assistance: Min assist Gait Distance (Feet): 25 Feet Assistive device: Rolling walker (2 wheeled) Gait Pattern/deviations: Step-to pattern;Decreased stride length;Decreased step length - right;Decreased step length - left Gait velocity: decreased   General Gait Details: pt was able to ambulate ~ 25 ft on mobile trach collar 10 L. distances limited by pt having BM and requesting to go to BR after ambulating ~ 15 ft.       Balance Overall balance assessment: Needs assistance Sitting-balance support: Feet supported Sitting balance-Leahy Scale: Good Sitting balance - Comments: no balance deficits sitting EOB   Standing balance support: Bilateral upper extremity supported;During functional activity Standing balance-Leahy Scale: Good Standing balance comment: much safer with BUE support versus single UE support       Cognition Arousal/Alertness: Awake/alert Behavior During Therapy: WFL for tasks assessed/performed Overall Cognitive Status: Within Functional Limits for tasks assessed        General Comments: able to mouth words clearly. cooperative however pt does get slightly anxious once fatigued             Pertinent Vitals/Pain Pain Assessment: No/denies pain Faces Pain Scale: No hurt     PT Goals (current goals can now be found in the care plan section)  Acute Rehab PT Goals Patient Stated Goal: To return home safely Progress towards PT goals: Progressing toward goals    Frequency    Min 2X/week      PT Plan  Discharge plan needs to be updated    Co-evaluation     PT goals addressed during session: Mobility/safety with mobility;Balance;Strengthening/ROM;Proper use of DME        AM-PAC PT "6 Clicks" Mobility   Outcome Measure  Help needed turning from your back to your side while in a flat bed without using bedrails?: None Help needed moving from lying on your back to sitting on the side of a flat bed without using bedrails?: A Little Help needed moving to and from a bed to a chair (including a wheelchair)?: A Little Help needed standing up from a chair using your arms (e.g., wheelchair or bedside chair)?: A Little Help needed to walk in hospital room?: A Lot Help needed climbing 3-5 steps with a railing? : A Lot 6 Click Score: 17    End of Session Equipment Utilized During Treatment: Oxygen (10 L trach collar throughout) Activity Tolerance: Patient limited by fatigue;Patient tolerated treatment well Patient left: in bed;with call bell/phone within reach Nurse Communication: Mobility status PT Visit Diagnosis: Unsteadiness on feet (R26.81);Muscle weakness (generalized) (M62.81);Difficulty in walking, not elsewhere classified (R26.2)     Time: 4196-2229 PT Time Calculation (min) (ACUTE ONLY): 39 min  Charges:  $Gait Training: 8-22 mins $Therapeutic Activity: 23-37 mins                     Jetta Lout PTA 07/21/20, 3:08 PM

## 2020-07-21 NOTE — Progress Notes (Signed)
   07/21/20 1747  Assess: MEWS Score  Temp 98.9 F (37.2 C)  BP 127/85  Pulse Rate (!) 106  Resp (!) 30  Level of Consciousness Alert  SpO2 100 %  O2 Device Tracheostomy Collar  O2 Flow Rate (L/min) 10 L/min  FiO2 (%) 40 %  Assess: MEWS Score  MEWS Temp 0  MEWS Systolic 0  MEWS Pulse 1  MEWS RR 2  MEWS LOC 0  MEWS Score 3  MEWS Score Color Yellow  Assess: if the MEWS score is Yellow or Red  Were vital signs taken at a resting state? Yes  Focused Assessment Change from prior assessment (see assessment flowsheet)  Early Detection of Sepsis Score *See Row Information* Low  MEWS guidelines implemented *See Row Information* Yes  Treat  MEWS Interventions Escalated (See documentation below)  Pain Scale 0-10  Pain Score 0  Notify: Charge Nurse/RN  Name of Charge Nurse/RN Notified Annabella, RN  Date Charge Nurse/RN Notified 07/21/20  Time Charge Nurse/RN Notified 1830  Notify: Provider  Provider Name/Title Sherryll Burger  Date Provider Notified 07/21/20  Time Provider Notified 1800  Notification Reason Change in status  Response No new orders  Date of Provider Response 07/21/20  Time of Provider Response 1800

## 2020-07-21 NOTE — Progress Notes (Signed)
PULMONARY PROGRESS NOTE    Name: Virginia [name] MRN: [id number] DOB: 01-07-1951     LOS: 75   SUBJECTIVE FINDINGS & SIGNIFICANT EVENTS    Patient description:  70 yo female with severe COPD admitted with acute on chronic hypoxic hypercapnic respiratory failure and acute encephalopathy secondary to narcotic use and AECOPD requiring mechanical intubation  Now s/p trach/PEG working on trach weaning.  On TC at present, anxious. Remains on precedex.  06/27/20- patient is improved.  Signed out to [facility] - Dr Jimmye Norman for pick up in am.   06/29/20- patient is with respiratory distress, she has high volume phlegm from trache, ive suctioned out whats possible via [place] and messaged RT to assist.  RN was able to come in and evaluate patient and she was able to communicate appropriately.   07/07/20- patient is in bed resting. She is clinically better today then yesterday. Resting in bed in no distress.  07/08/20- Patient is lucid and awake.  Son is at bedside we discussed care plan.  CXR in am.   1/16-17/22- patient resting in bed comfortbably no acute events overnight.   07/11/20- patient had episode of mild respiratory distress due to voluminous phlegm expectorated via trache. This was suctioned by RN with recovery. Patient is clear to auscultation with occasional low pitch wheezes.  She is being optimized for d/c.   07/20/20- patient had episode of mild-moderate respiratory distress due to obstructed trache with inspissated phlegm.  She is on 7-8L/min humidifed HFnc.  She is with ENTEROBACTER AEROGENES on trache aspirate culture. ID is following and there is abscence of leucosytosis or fevers in several days.    07/21/20- patient is with increased O2 requirement this morning up to 10L/min.  She is actually clinically  better in appearance and lung auscultation is improved from yesterday.  She is smiling during interview.  She has PT today and may have increased O2 in preparation for therapy. Plan to continue current care plan.     PHYSICAL EXAMINATION   Vital Signs: Temp:  [97.9 F (36.6 C)-99.1 F (37.3 C)] 98.9 F (37.2 C) (01/28 0923) Pulse Rate:  [99-104] 103 (01/28 0923) Resp:  [19-29] 29 (01/28 0923) BP: (105-143)/(76-91) 143/90 (01/28 0923) SpO2:  [90 %-100 %] 100 % (01/28 0923) FiO2 (%):  [40 %] 40 % (01/28 0802)   FiO2 (%):  [40 %] 40 %   Constitutional: anxious frail woman lying in bed  Eyes: eomi, pupils equal Ears, nose, mouth, and throat: trach in place, minmal secretions Cardiovascular: RRR,  Ext warm Respiratory: diminished with prolonged expiratory phase, occasional accessory muscle use Gastrointestinal: soft, abd wrapped after PEG, some mild tenderness at PEG insertion site MSK: muscle wasting Skin: No rashes, normal turgor, scattered bruising Neurologic: moves all 4 ext to command, weak Psychiatric: anxious, answering questions appropriately   Scheduled Meds: . apixaban  5 mg Per Tube BID  . arformoterol  15 mcg Nebulization BID  . chlorhexidine gluconate (MEDLINE KIT)  15 mL Mouth Rinse BID  . Chlorhexidine Gluconate Cloth  6 each Topical Daily  . clonazePAM  1 mg Per Tube BID  . feeding supplement (KATE FARMS STANDARD 1.4)  325 mL Per Tube QID  . fentaNYL  1 patch Transdermal Q72H  . fiber  1 packet Per Tube BID  . free water  120 mL Per Tube QID  . gabapentin  300 mg Per Tube TID  . glycopyrrolate  1 mg Per Tube TID  . insulin aspart  0-6  Units Subcutaneous Q4H  . mouth rinse  15 mL Mouth Rinse 10 times per day  . pantoprazole sodium  40 mg Per Tube QHS  . QUEtiapine  25 mg Per Tube QHS  . sodium chloride flush  10-40 mL Intracatheter Q12H   Continuous Infusions: . sodium chloride Stopped (06/25/20 1842)   PRN Meds:.acetaminophen (TYLENOL) oral liquid 160  mg/5 mL, diazepam, ipratropium-albuterol, labetalol, naphazoline-glycerin, ondansetron (ZOFRAN) IV    ASSESSMENT AND PLAN   Acute on chronic hypoxemic and hypercapnic respiratory failure thought secondary to polypharmacy long wean from ventilator due to muscular deconditioning now s/p tracheostomy.         -Heavy phlegm production - Glycopyrrolate tid 37m-07/21/20  Dysphagia, moderate protein calorie malnutrition- now s/p PEG  Severe baseline COPD- complicated trach wean - continue COPD carepath  Muscular deconditioning  Chronic anxiety/depression  Metabolic encephalopathy/agitation- improving  - repeat tracheal aspirate ordered -ENTEROBACTER AEROGENES - Switched from standing duonebs from duonebs to brovana/yupelri - PT/OT up to chair - Continue standing clonazepam, use valium per tube for breakthrough - SLP for PMV trials - g off IV meds for rehab vs. SNF vs. LTACH   Patient chronically critically ill due to respiratory failure, metabolic encephalopathy Interventions to address this today weaning precedex, weaning vent Risk of deterioration without these interventions is high    [facility] *This note was dictated using voice recognition software/Dragon.  Despite best efforts to proofread, errors can occur which can change the meaning.  Any change was purely unintentional.     FOttie Glazier M.D.  Pulmonary & [place]

## 2020-07-21 NOTE — Progress Notes (Addendum)
PROGRESS NOTE    Virginia [name]  JGG:[id number] DOB: 07/13/1950 DOA: 06/06/2020 PCP: Idelle Crouch, MD    Brief Narrative:  This 70 years old female with severe COPD admitted with acute on chronic hypoxic and hypercapnic respiratory failure and acute encephalopathy secondary to narcotic use and advanced COPD requiring mechanical ventilation.  She was treated with steroids and IV antibiotics.  She could not be liberated from the ventilator so tracheostomy was performed and she has been receiving oxygen via trach collar.  Patient developed worsening hypoxia on 1/15 chest x-ray shows bilateral lower lobe infiltrate consistent with aspiration pneumonia.  She has completed antibiotics.  On 1/20 patient had a sudden onset of altered mental status,  ABG shows severe metabolic acidosis.  Patient was transferred to ICU. PCCM pickup 1/24: Acute encephalopathy resolved.  CT head unremarkable for any acute stroke or bleeding.  Mental status normalized without any focal deficit. COPD appears stable.  1/26 -> transfer to med-surg. Waiting for LTAC decision 1/27-> LTAC declined.  Waiting for SNF placement 1/28 -> oxygen requirement up to 10 L after PT/OT session.  Nursing reporting 2 large loose bowel movement today  Assessment & Plan:   Active Problems:   AF (paroxysmal atrial fibrillation) (HCC)   Anemia of chronic disease   COPD with acute lower respiratory infection ([place])   Dysphagia   Respiratory failure (HCC)   COPD exacerbation (HCC)   Acute urinary retention   Acute metabolic encephalopathy   Acute on chronic respiratory failure with hypoxia and hypercapnia (HCC)   Aspiration pneumonia of both lower lobes due to gastric secretions (HCC)   Reactive thrombocytosis   Pressure injury of skin   Diarrhea   Acute on chronic respiratory failure with hypoxemia and hypercapnia. COPD exacerbation: Stable. Aspiration pneumonia :  Completed IV zosyn on 1/25 She is on trach collar, O2 sats 97%  on 10 L, wean as able  He had a transient episode of moderate respiratory distress earlier due to blocked mucous/obstructed trach.  Now resolved Tracheal aspirate growing Enterobacter aerogenes Glycopyrrolate for secretions  Acute Metabolic Encephalopathy:  >>>.Resolved. Patient had a sudden onset of slurred speech and altered mental status. Differential including mucous plug, aspiration pneumonia versus acute intracranial hemorrhage. Stat ABG showed pH of 7.0.  Patient was not responding, code stroke called. CT scan ruled out intracranial hemorrhage. Patient does not have hypoglycemia. Patient was transferred to ICU. She is back to her baseline mental status. Stopping Narcotics one at a time (stopped norco on 1/26)   Severe metabolic acidosis>> resolved Etiology still unclear, it appears to be sudden onset.  Diarrhea: >>> Nursing reports 2 large loose bowel movement today on 1/28 Patient has been on antibiotics but no leukocytosis or abdominal pain,   ID recommended no need for C. difficile testing. Could be tube feed related.  Could consider Imodium as needed  Dysphagia. Deconditioning Moderate protein calorie malnutrition. On TFs and tolerating  Anxiety :  continue Klonopin.  Paroxysmal A. Fib:  Continue Eliquis, normal sinus rhythm. Heart rate controlled.  PICC line in place since 12/31 (placed in ICU) - I've requested nurse to reassess the need and remove it if no further need. May obtain peri. IV before hand.  DVT prophylaxis: Eliquis  Code Status: Full code Family Communication: I updated patient's daughter-in-law on 1/27 Disposition Plan:  SNF versus CIR  Status is: Inpatient   Remains inpatient appropriate because:Inpatient level of care appropriate due to severity of illness   Dispo: The patient is  from: Home              Anticipated d/c is to: SNF              Anticipated d/c date is: > 3 days              Patient currently is not medically stable to  d/c.TOC team working on placement/SNF versus CIR   Difficult to place patient Yes    Consultants:   PCCM  Procedures:  Antimicrobials:  Anti-infectives (From admission, onward)   Start     Dose/Rate Route Frequency Ordered Stop   07/14/20 1100  piperacillin-tazobactam (ZOSYN) IVPB 3.375 g        3.375 g 12.5 mL/hr over 240 Minutes Intravenous Every 8 hours 07/14/20 0946 07/18/20 2359   07/08/20 1145  amoxicillin-clavulanate (AUGMENTIN) 875-125 MG per tablet 1 tablet  Status:  Discontinued        1 tablet Per Tube Every 12 hours 07/08/20 1055 07/11/20 0803   07/06/20 1530  piperacillin-tazobactam (ZOSYN) IVPB 3.375 g  Status:  Discontinued        3.375 g 12.5 mL/hr over 240 Minutes Intravenous Every 8 hours 07/06/20 1433 07/08/20 1055   06/21/20 1200  ceFAZolin (ANCEF) IVPB 2g/100 mL premix       Note to Pharmacy: Please have at bedside. Will be administered one hour prior to procedure. Endo staff will instruct on when to start infusion.   2 g 200 mL/hr over 30 Minutes Intravenous  Once 06/21/20 0820 06/21/20 2030   06/07/20 1400  azithromycin (ZITHROMAX) 500 mg in sodium chloride 0.9 % 250 mL IVPB  Status:  Discontinued        500 mg 250 mL/hr over 60 Minutes Intravenous Every 24 hours 06/06/20 2030 06/06/20 2044   06/07/20 1400  azithromycin (ZITHROMAX) 500 mg in sodium chloride 0.9 % 250 mL IVPB  Status:  Discontinued        500 mg 250 mL/hr over 60 Minutes Intravenous Every 24 hours 06/06/20 2044 06/08/20 1031   06/06/20 1415  cefTRIAXone (ROCEPHIN) 2 g in sodium chloride 0.9 % 100 mL IVPB        2 g 200 mL/hr over 30 Minutes Intravenous  Once 06/06/20 1407 06/06/20 1514   06/06/20 1415  azithromycin (ZITHROMAX) 500 mg in sodium chloride 0.9 % 250 mL IVPB        500 mg 250 mL/hr over 60 Minutes Intravenous  Once 06/06/20 1407 06/06/20 1708     Subjective: Nursing reports 2 large loose watery bowel movements.  Patient's oxygen requirement gone up likely due to exertion with  PT, OT  Objective: Vitals:   07/21/20 0014 07/21/20 0025 07/21/20 0414 07/21/20 0923  BP:  105/76 (!) 135/91 (!) 143/90  Pulse:  (!) 104 100 (!) 103  Resp:  20 (!) 22 (!) 29  Temp:  98.6 F (37 C) 97.9 F (36.6 C) 98.9 F (37.2 C)  TempSrc:  Oral Oral Oral  SpO2: 99% 93% 90% 100%  Weight:      Height:        Intake/Output Summary (Last 24 hours) at 07/21/2020 1401 Last data filed at 07/20/2020 2000 Gross per 24 hour  Intake 60 ml  Output --  Net 60 ml   Filed Weights   07/16/20 0500 07/17/20 0426 07/18/20 0500  Weight: 56.4 kg 55.4 kg 54.9 kg    Examination:  General exam: Appears calm and comfortable , trach collar noted. Respiratory system: Clear to auscultation.  Respiratory effort normal. Cardiovascular system: S1 & S2 heard, RRR. No JVD, murmurs, rubs, gallops or clicks. No pedal edema. Gastrointestinal system: Abdomen is nondistended, soft and nontender. No organomegaly or masses felt. Normal bowel sounds heard.  PEG tube in place Central nervous system: Alert and oriented. No focal neurological deficits. Extremities: Symmetric 5 x 5 power. Skin: No rashes, lesions or ulcers Psychiatry: Judgement and insight appear normal. Mood & affect appropriate.  PICC line in place since 12/31 (placed in ICU)   Data Reviewed: I have personally reviewed following labs and imaging studies  CBC: Recent Labs  Lab 07/15/20 0433 07/15/20 2042 07/16/20 0342 07/17/20 0427 07/18/20 0521 07/19/20 0430  WBC 9.5  --  6.6 9.1 8.3 9.1  HGB 6.5* 8.2* 7.7* 8.5* 8.3* 8.4*  HCT 21.3* 27.0* 25.6* 28.3* 27.8* 28.7*  MCV 94.2  --  94.8 95.9 97.9 98.3  PLT 319  --  291 317 301 712   Basic Metabolic Panel: Recent Labs  Lab 07/15/20 0433 07/16/20 0342 07/17/20 0427 07/18/20 0521 07/19/20 0430  NA 138 143 143 141 144  K 3.7 3.5 4.0 4.2 4.3  CL 94* 96* 96* 95* 97*  CO2 33* 34* 36* 37* 41*  GLUCOSE 93 91 90 92 89  BUN 26* '23 19 17 14  ' CREATININE 1.34* 0.91 0.68 0.58 0.46   CALCIUM 8.9 8.7* 8.8* 8.6* 8.8*  MG 1.9 2.1 1.9 1.8 2.1  PHOS 5.5* 5.8* 3.1 2.4* 2.9   GFR: Estimated Creatinine Clearance: 52.5 mL/min (by C-G formula based on SCr of 0.46 mg/dL). Liver Function Tests: Recent Labs  Lab 07/16/20 0342 07/17/20 0427  ALBUMIN 2.5* 2.7*   No results for input(s): LIPASE, AMYLASE in the last 168 hours. No results for input(s): AMMONIA in the last 168 hours. Coagulation Profile: No results for input(s): INR, PROTIME in the last 168 hours. Cardiac Enzymes: No results for input(s): CKTOTAL, CKMB, CKMBINDEX, TROPONINI in the last 168 hours. BNP (last 3 results) No results for input(s): PROBNP in the last 8760 hours. HbA1C: No results for input(s): HGBA1C in the last 72 hours. CBG: Recent Labs  Lab 07/20/20 2020 07/20/20 2045 07/21/20 0020 07/21/20 0412 07/21/20 0828  GLUCAP 105* 104* 109* 90 80   Lipid Profile: No results for input(s): CHOL, HDL, LDLCALC, TRIG, CHOLHDL, LDLDIRECT in the last 72 hours. Thyroid Function Tests: No results for input(s): TSH, T4TOTAL, FREET4, T3FREE, THYROIDAB in the last 72 hours. Anemia Panel: No results for input(s): VITAMINB12, FOLATE, FERRITIN, TIBC, IRON, RETICCTPCT in the last 72 hours. Sepsis Labs: Recent Labs  Lab 07/15/20 0433  PROCALCITON 0.18    Recent Results (from the past 240 hour(s))  Culture, respiratory (non-expectorated)     Status: None   Collection Time: 07/13/20  4:20 PM   Specimen: Tracheal Aspirate; Respiratory  Result Value Ref Range Status   Specimen Description   Final    TRACHEAL ASPIRATE Performed at [facility], [address], [place], [place] [address]    Special Requests   Final    NONE Performed at [facility], [place], [place] [address]    Gram Stain NO WBC SEEN ABUNDANT GRAM POSITIVE RODS   Final   Culture   Final    FEW ENTEROBACTER AEROGENES MODERATE DIPHTHEROIDS(CORYNEBACTERIUM SPECIES) Standardized susceptibility testing  for this organism is not available. Performed at [facility], [place] [address] [place], [place] [address]    Report Status 07/17/2020 FINAL  Final   Organism ID, Bacteria  ENTEROBACTER AEROGENES  Final      Susceptibility   Enterobacter aerogenes - MIC*    CEFAZOLIN >=64 RESISTANT Resistant     CEFEPIME <=0.12 SENSITIVE Sensitive     CEFTAZIDIME <=1 SENSITIVE Sensitive     CEFTRIAXONE <=0.25 SENSITIVE Sensitive     CIPROFLOXACIN <=0.25 SENSITIVE Sensitive     GENTAMICIN <=1 SENSITIVE Sensitive     IMIPENEM 1 SENSITIVE Sensitive     TRIMETH/SULFA <=20 SENSITIVE Sensitive     PIP/TAZO <=4 SENSITIVE Sensitive     * FEW ENTEROBACTER AEROGENES    Radiology Studies: No results found.   Scheduled Meds: . apixaban  5 mg Per Tube BID  . arformoterol  15 mcg Nebulization BID  . chlorhexidine gluconate (MEDLINE KIT)  15 mL Mouth Rinse BID  . Chlorhexidine Gluconate Cloth  6 each Topical Daily  . clonazePAM  1 mg Per Tube BID  . feeding supplement ([employer] STANDARD 1.4)  325 mL Per Tube QID  . fentaNYL  1 patch Transdermal Q72H  . fiber  1 packet Per Tube BID  . free water  120 mL Per Tube QID  . gabapentin  300 mg Per Tube TID  . glycopyrrolate  1 mg Per Tube TID  . mouth rinse  15 mL Mouth Rinse 10 times per day  . pantoprazole sodium  40 mg Per Tube QHS  . QUEtiapine  25 mg Per Tube QHS  . sodium chloride flush  10-40 mL Intracatheter Q12H   Continuous Infusions: . sodium chloride Stopped (06/25/20 1842)     LOS: 45 days    Time spent: 25 mins    Jaleah Lefevre Manuella Ghazi, MD [facility]   If 7PM-7AM, please contact night-coverage

## 2020-07-21 NOTE — TOC Progression Note (Signed)
Transition of Care [facility]) - Progression Note    Patient Details  Name: Virginia [name] MRN: [id number] Date of Birth: [birth year]  Transition of [facility]) CM/SW Contact  Maree Krabbe, LCSW Phone Number: 07/21/2020, 3:52 PM  Clinical Narrative:   Case discussed with [facility] Supervisor Darl Pikes and at this time the only facility taking a trach is [facility] in [place]. Pt is unable to go to [facility]. Multiple attempts have been made and multiple denials have been presented. Pt unable to go to SNF in county because they do not take a trach. CSW explained this to pt's daughter. Pt's daughter asking if [facility] in [place] takes trachs. CSW contacted admission and she is going to ask and get back to CSW but she is pretty sure they don't.       Barriers to Discharge: Continued Medical Work up  Expected Discharge Plan and Services   In-house Referral: Clinical Social Work,Hospice / Palliative Care   Post Acute Care Choice: Durable Medical Equipment (Oxygen 4L) Living arrangements for the past 2 months: Apartment                                       Social Determinants of Health (SDOH) Interventions    Readmission Risk Interventions No flowsheet data found.

## 2020-07-21 NOTE — Progress Notes (Signed)
Inpatient Rehab Admissions Coordinator Note:   Per updated PT recommendations, pt was screened for CIR candidacy by Estill Dooms, PT, DPT.  Based on HR and RR during therapy session, it does not appear pt would be able to tolerate CIR.  Also note pt on 40% trach collar and we cannot accept pts on higher than 35% trach collar.  She would need to be tolerating therapy on 35% trach collar (no more than 8L with mobility without significant change in O2 saturations and HR/RR within a reasonable range).  No consult at this time but I will follow from a distance as she appears to mobilizing with minimal assist or better at this time.  Please contact me with questions.   Estill Dooms, PT, DPT [phone] 07/21/20 4:44 PM

## 2020-07-22 DIAGNOSIS — G9341 Metabolic encephalopathy: Secondary | ICD-10-CM | POA: Diagnosis not present

## 2020-07-22 DIAGNOSIS — J9621 Acute and chronic respiratory failure with hypoxia: Secondary | ICD-10-CM | POA: Diagnosis not present

## 2020-07-22 DIAGNOSIS — J9622 Acute and chronic respiratory failure with hypercapnia: Secondary | ICD-10-CM | POA: Diagnosis not present

## 2020-07-22 MED ORDER — RISAQUAD PO CAPS
1.0000 | ORAL_CAPSULE | Freq: Three times a day (TID) | ORAL | Status: DC
  Administered 2020-07-22 – 2020-08-11 (×60): 1 via ORAL
  Filled 2020-07-22 (×60): qty 1

## 2020-07-22 NOTE — Progress Notes (Signed)
PROGRESS NOTE    CALIFORNIA HUBERTY  MHW:[id number] DOB: 19-Dec-1950 DOA: 06/06/2020 PCP: Idelle Crouch, MD    Brief Narrative: This 70 years old female with severe COPD admitted with acute on chronic hypoxic and hypercapnic respiratory failure and acute encephalopathy secondary to narcotic use and advanced COPD requiring mechanical ventilation.  She was treated with steroids and IV antibiotics.  She could not be liberated from the ventilator so tracheostomy was performed and she has been receiving oxygen via trach collar. Patient developed worsening hypoxia on 1/15.  Chest x-ray shows bilateral lower lobe infiltrate consistent with aspiration pneumonia.  She has completed antibiotics.  On 1/20 patient had a sudden onset of altered mental status,  ABG shows severe metabolic acidosis.  Patient was transferred to ICU. PCCM pickup 1/24: Acute encephalopathy resolved.  CT head unremarkable for any acute stroke or bleeding.  Mental status normalized without any focal deficit. COPD appears stable.  1/26 -> transfer to med-surg. Waiting for LTAC decision. 1/27-> LTAC declined.  Waiting for SNF placement. 1/28 -> oxygen requirement up to 10 L after PT/OT session.  Nursing reporting 2 large loose bowel movement today. 1/29: > RRT called, O2 saturation dropped to 70s.  Found to have mucous plugging in the trach which was removed,  O2 saturation improved.   Assessment & Plan:   Active Problems:   AF (paroxysmal atrial fibrillation) (HCC)   Anemia of chronic disease   COPD with acute lower respiratory infection ([place])   Dysphagia   Respiratory failure (HCC)   COPD exacerbation (HCC)   Acute urinary retention   Acute metabolic encephalopathy   Acute on chronic respiratory failure with hypoxia and hypercapnia (HCC)   Aspiration pneumonia of both lower lobes due to gastric secretions (HCC)   Reactive thrombocytosis   Pressure injury of skin   Diarrhea   Acute on chronic hypoxic and hypercapnic  respiratory failure. COPD exacerbation: Stable. Aspiration pneumonia :  Completed IV zosyn on 1/25. She is on trach collar, O2 sats 97% on 10 L, wean as able  She had a transient episode of moderate respiratory distress earlier due to blocked mucous/obstructed trach.  Now resolved Tracheal aspirate growing Enterobacter aerogenes Continue Glycopyrrolate for secretions.  AcuteMetabolic Encephalopathy:  >>>Resolved. Patient had a sudden onset of slurred speech and altered mental status. Differential including mucous plug, aspiration pneumoniaversus acute intracranial hemorrhage. Stat ABG showed pH of 7.0. Patient was not responding, code stroke called. CT scan ruled out intracranial hemorrhage. Patient does not have hypoglycemia. Patient was transferred to ICU. She is back to her baseline mental status. Stopping Narcotics one at a time (stopped norco on 1/26)   Severe metabolic acidosis>> resolved Etiology still unclear, it appears to be sudden onset.  Diarrhea: >>> Nursing reports 2 large loose bowel movement on 1/28. Patient has been on antibiotics but no leukocytosis or abdominal pain,   ID recommended no need for C. difficile testing. Could be tube feed related.  Could consider Imodium as needed  Dysphagia. Deconditioning Moderate protein calorie malnutrition. On TFs and tolerating  Anxiety :  Continue Klonopin.  Paroxysmal A. Fib:  Continue Eliquis, normal sinus rhythm. Heart rate controlled.    DVT prophylaxis:  Lovenox Code Status: Full code. Family Communication: No family at bed side. Disposition Plan:  Status is: Inpatient  Remains inpatient appropriate because:Inpatient level of care appropriate due to severity of illness   Dispo: The patient is from: Home  Anticipated d/c is to: SNF              Anticipated d/c date is: > 3 days              Patient currently is not medically stable to d/c. TOC team working on placement/SNF versus  CIR   Difficult to place patient Yes   Consultants:   PCCM.  Procedures: Tracheostomy and PEG tube.  Antimicrobials:   Anti-infectives (From admission, onward)   Start     Dose/Rate Route Frequency Ordered Stop   07/14/20 1100  piperacillin-tazobactam (ZOSYN) IVPB 3.375 g        3.375 g 12.5 mL/hr over 240 Minutes Intravenous Every 8 hours 07/14/20 0946 07/18/20 2359   07/08/20 1145  amoxicillin-clavulanate (AUGMENTIN) 875-125 MG per tablet 1 tablet  Status:  Discontinued        1 tablet Per Tube Every 12 hours 07/08/20 1055 07/11/20 0803   07/06/20 1530  piperacillin-tazobactam (ZOSYN) IVPB 3.375 g  Status:  Discontinued        3.375 g 12.5 mL/hr over 240 Minutes Intravenous Every 8 hours 07/06/20 1433 07/08/20 1055   06/21/20 1200  ceFAZolin (ANCEF) IVPB 2g/100 mL premix       Note to Pharmacy: Please have at bedside. Will be administered one hour prior to procedure. Endo staff will instruct on when to start infusion.   2 g 200 mL/hr over 30 Minutes Intravenous  Once 06/21/20 0820 06/21/20 2030   06/07/20 1400  azithromycin (ZITHROMAX) 500 mg in sodium chloride 0.9 % 250 mL IVPB  Status:  Discontinued        500 mg 250 mL/hr over 60 Minutes Intravenous Every 24 hours 06/06/20 2030 06/06/20 2044   06/07/20 1400  azithromycin (ZITHROMAX) 500 mg in sodium chloride 0.9 % 250 mL IVPB  Status:  Discontinued        500 mg 250 mL/hr over 60 Minutes Intravenous Every 24 hours 06/06/20 2044 06/08/20 1031   06/06/20 1415  cefTRIAXone (ROCEPHIN) 2 g in sodium chloride 0.9 % 100 mL IVPB        2 g 200 mL/hr over 30 Minutes Intravenous  Once 06/06/20 1407 06/06/20 1514   06/06/20 1415  azithromycin (ZITHROMAX) 500 mg in sodium chloride 0.9 % 250 mL IVPB        500 mg 250 mL/hr over 60 Minutes Intravenous  Once 06/06/20 1407 06/06/20 1708      Subjective: Patient was seen and examined at bedside.  Overnight events noted.  Patient O2 saturation dropped. RRT was called.  Patient had mucous  plugging in the tracheostomy tube which was suctioned.  she feels better afterwards.  Objective: Vitals:   07/22/20 0505 07/22/20 0859 07/22/20 0859 07/22/20 1237  BP: 114/73  (!) 139/94 133/87  Pulse: (!) 104  (!) 108 (!) 108  Resp: '20  20 20  ' Temp: (!) 97.5 F (36.4 C)  98.5 F (36.9 C) 97.9 F (36.6 C)  TempSrc: Oral  Oral Oral  SpO2: 100% 96% 97% 99%  Weight:      Height:        Intake/Output Summary (Last 24 hours) at 07/22/2020 1353 Last data filed at 07/21/2020 1845 Gross per 24 hour  Intake 720 ml  Output --  Net 720 ml   Filed Weights   07/16/20 0500 07/17/20 0426 07/18/20 0500  Weight: 56.4 kg 55.4 kg 54.9 kg    Examination:  General exam: Appears calm and comfortable , trach collar noted  Respiratory system: Clear to auscultation. Respiratory effort normal. Cardiovascular system: S1 & S2 heard, RRR. No JVD, murmurs, rubs, gallops or clicks. No pedal edema. Gastrointestinal system: Abdomen is nondistended, soft and nontender. No organomegaly or masses felt. Normal bowel sounds heard.  PEG tube in place. Central nervous system: Alert and oriented. No focal neurological deficits. Extremities: Symmetric 5 x 5 power. Skin: No rashes, lesions or ulcers Psychiatry: Judgement and insight appear normal. Mood & affect appropriate.     Data Reviewed: I have personally reviewed following labs and imaging studies  CBC: Recent Labs  Lab 07/15/20 2042 07/16/20 0342 07/17/20 0427 07/18/20 0521 07/19/20 0430  WBC  --  6.6 9.1 8.3 9.1  HGB 8.2* 7.7* 8.5* 8.3* 8.4*  HCT 27.0* 25.6* 28.3* 27.8* 28.7*  MCV  --  94.8 95.9 97.9 98.3  PLT  --  291 317 301 720   Basic Metabolic Panel: Recent Labs  Lab 07/16/20 0342 07/17/20 0427 07/18/20 0521 07/19/20 0430  NA 143 143 141 144  K 3.5 4.0 4.2 4.3  CL 96* 96* 95* 97*  CO2 34* 36* 37* 41*  GLUCOSE 91 90 92 89  BUN '23 19 17 14  ' CREATININE 0.91 0.68 0.58 0.46  CALCIUM 8.7* 8.8* 8.6* 8.8*  MG 2.1 1.9 1.8 2.1  PHOS  5.8* 3.1 2.4* 2.9   GFR: Estimated Creatinine Clearance: 52.5 mL/min (by C-G formula based on SCr of 0.46 mg/dL). Liver Function Tests: Recent Labs  Lab 07/16/20 0342 07/17/20 0427  ALBUMIN 2.5* 2.7*   No results for input(s): LIPASE, AMYLASE in the last 168 hours. No results for input(s): AMMONIA in the last 168 hours. Coagulation Profile: No results for input(s): INR, PROTIME in the last 168 hours. Cardiac Enzymes: No results for input(s): CKTOTAL, CKMB, CKMBINDEX, TROPONINI in the last 168 hours. BNP (last 3 results) No results for input(s): PROBNP in the last 8760 hours. HbA1C: No results for input(s): HGBA1C in the last 72 hours. CBG: Recent Labs  Lab 07/21/20 0020 07/21/20 0412 07/21/20 0828 07/21/20 1130 07/21/20 1611  GLUCAP 109* 90 80 92 88   Lipid Profile: No results for input(s): CHOL, HDL, LDLCALC, TRIG, CHOLHDL, LDLDIRECT in the last 72 hours. Thyroid Function Tests: No results for input(s): TSH, T4TOTAL, FREET4, T3FREE, THYROIDAB in the last 72 hours. Anemia Panel: No results for input(s): VITAMINB12, FOLATE, FERRITIN, TIBC, IRON, RETICCTPCT in the last 72 hours. Sepsis Labs: No results for input(s): PROCALCITON, LATICACIDVEN in the last 168 hours.  Recent Results (from the past 240 hour(s))  Culture, respiratory (non-expectorated)     Status: None   Collection Time: 07/13/20  4:20 PM   Specimen: Tracheal Aspirate; Respiratory  Result Value Ref Range Status   Specimen Description   Final    TRACHEAL ASPIRATE Performed at [facility], [address], [place], [place] [address]    Special Requests   Final    NONE Performed at [facility], [place], [place] [address]    Gram Stain NO WBC SEEN ABUNDANT GRAM POSITIVE RODS   Final   Culture   Final    FEW ENTEROBACTER AEROGENES MODERATE DIPHTHEROIDS(CORYNEBACTERIUM SPECIES) Standardized susceptibility testing for this organism is not available. Performed at [facility], [place] [address] [place], [place] [address]    Report Status 07/17/2020 FINAL  Final   Organism ID, Bacteria ENTEROBACTER AEROGENES  Final      Susceptibility   Enterobacter aerogenes - MIC*    CEFAZOLIN >=64 RESISTANT  Resistant     CEFEPIME <=0.12 SENSITIVE Sensitive     CEFTAZIDIME <=1 SENSITIVE Sensitive     CEFTRIAXONE <=0.25 SENSITIVE Sensitive     CIPROFLOXACIN <=0.25 SENSITIVE Sensitive     GENTAMICIN <=1 SENSITIVE Sensitive     IMIPENEM 1 SENSITIVE Sensitive     TRIMETH/SULFA <=20 SENSITIVE Sensitive     PIP/TAZO <=4 SENSITIVE Sensitive     * FEW ENTEROBACTER AEROGENES    Radiology Studies: No results found.  Scheduled Meds: . acidophilus  1 capsule Oral TID  . apixaban  5 mg Per Tube BID  . arformoterol  15 mcg Nebulization BID  . chlorhexidine gluconate (MEDLINE KIT)  15 mL Mouth Rinse BID  . clonazePAM  1 mg Per Tube BID  . feeding supplement (KATE FARMS STANDARD 1.4)  325 mL Per Tube QID  . fentaNYL  1 patch Transdermal Q72H  . fiber  1 packet Per Tube TID  . free water  120 mL Per Tube QID  . gabapentin  300 mg Per Tube TID  . glycopyrrolate  1 mg Per Tube TID  . mouth rinse  15 mL Mouth Rinse 10 times per day  . pantoprazole sodium  40 mg Per Tube QHS  . QUEtiapine  25 mg Per Tube QHS   Continuous Infusions:   LOS: 46 days    Time spent: 35 mins.    Shawna Clamp, MD [facility]   If 7PM-7AM, please contact night-coverage

## 2020-07-22 NOTE — Significant Event (Signed)
Rapid Response Event Note   Reason for Call : called for RR on trach pt with decreased 02 sats in distress.   Initial Focused Assessment: Laying in bed, alert, and anxious.       Interventions: Respiratory tracheal suctioned and changed inner cannula. Pt given anti anxiety medicine.   Plan of Care: Dannielle Huh, RN to call if further assistance needed. Plan to move pt closer to nurses station.    Event Summary:   MD Notified: Dr Kumar 1035 by phone Call Time:1029 Arrival Time:1032 End Time:1037  Kasiah Manka A, RN

## 2020-07-23 DIAGNOSIS — G9341 Metabolic encephalopathy: Secondary | ICD-10-CM | POA: Diagnosis not present

## 2020-07-23 LAB — CBC
HCT: 32.7 % — ABNORMAL LOW (ref 36.0–46.0)
Hemoglobin: 9.9 g/dL — ABNORMAL LOW (ref 12.0–15.0)
MCH: 28.1 pg (ref 26.0–34.0)
MCHC: 30.3 g/dL (ref 30.0–36.0)
MCV: 92.9 fL (ref 80.0–100.0)
Platelets: 359 10*3/uL (ref 150–400)
RBC: 3.52 MIL/uL — ABNORMAL LOW (ref 3.87–5.11)
RDW: 15.6 % — ABNORMAL HIGH (ref 11.5–15.5)
WBC: 8 10*3/uL (ref 4.0–10.5)
nRBC: 0 % (ref 0.0–0.2)

## 2020-07-23 LAB — BASIC METABOLIC PANEL
Anion gap: 7 (ref 5–15)
BUN: 16 mg/dL (ref 8–23)
CO2: 36 mmol/L — ABNORMAL HIGH (ref 22–32)
Calcium: 9.4 mg/dL (ref 8.9–10.3)
Chloride: 95 mmol/L — ABNORMAL LOW (ref 98–111)
Creatinine, Ser: 0.54 mg/dL (ref 0.44–1.00)
GFR, Estimated: >60 mL/min (ref >60)
Glucose, Bld: 91 mg/dL (ref 70–99)
Potassium: 4.1 mmol/L (ref 3.5–5.1)
Sodium: 138 mmol/L (ref 135–145)

## 2020-07-23 LAB — MAGNESIUM: Magnesium: 2 mg/dL (ref 1.7–2.4)

## 2020-07-23 LAB — GLUCOSE, CAPILLARY: Glucose-Capillary: 110 mg/dL — ABNORMAL HIGH (ref 70–99)

## 2020-07-23 LAB — PHOSPHORUS: Phosphorus: 4.4 mg/dL (ref 2.5–4.6)

## 2020-07-23 MED ORDER — METOPROLOL TARTRATE 50 MG PO TABS
50.0000 mg | ORAL_TABLET | Freq: Two times a day (BID) | ORAL | Status: DC
  Administered 2020-07-23 – 2020-08-11 (×39): 50 mg via ORAL
  Filled 2020-07-23 (×40): qty 1

## 2020-07-23 NOTE — Progress Notes (Signed)
PROGRESS NOTE    Virginia [name]  AXK:[id number] DOB: [birth year] DOA: 06/06/2020 PCP: Idelle Crouch, MD    Brief Narrative: This 70 years old female with severe COPD admitted with acute on chronic hypoxic and hypercapnic respiratory failure and acute encephalopathy secondary to narcotic use and advanced COPD requiring mechanical ventilation.  She was treated with steroids and IV antibiotics.  She could not be liberated from the ventilator so tracheostomy was performed and she has been receiving oxygen via trach collar. Patient developed worsening hypoxia on 1/15.  Chest x-ray shows bilateral lower lobe infiltrate consistent with aspiration pneumonia.  She has completed antibiotics.  On 1/20 patient had a sudden onset of altered mental status,  ABG shows severe metabolic acidosis.  Patient was transferred to ICU. PCCM pickup 1/24: Acute encephalopathy resolved.  CT head unremarkable for any acute stroke or bleeding.  Mental status normalized without any focal deficit. COPD appears stable.  1/26 -> transfer to med-surg. Waiting for LTAC decision. 1/27-> LTAC declined.  Waiting for SNF placement. 1/28 -> oxygen requirement up to 10 L after PT/OT session.  Nursing reporting 2 large loose bowel movement today. 1/29: > RRT called, O2 saturation dropped to 70s.  Found to have mucous plugging in the trach which was removed,  O2 saturation improved. 1/30- > Patient appears improved.  She is lying comfortably.  Oxygen requirement via trach 10 L.   Assessment & Plan:   Active Problems:   AF (paroxysmal atrial fibrillation) (HCC)   Anemia of chronic disease   COPD with acute lower respiratory infection ([place])   Dysphagia   Respiratory failure (HCC)   COPD exacerbation (HCC)   Acute urinary retention   Acute metabolic encephalopathy   Acute on chronic respiratory failure with hypoxia and hypercapnia (HCC)   Aspiration pneumonia of both lower lobes due to gastric secretions (HCC)   Reactive  thrombocytosis   Pressure injury of skin   Diarrhea   Acute on chronic hypoxic and hypercapnic respiratory failure: COPD exacerbation: Stable. Aspiration pneumonia :  Completed IV zosyn on 1/25. She is on trach collar, O2 sats 97% on 10 L, wean as able  She had a transient episode of moderate respiratory distress earlier due to blocked mucous/obstructed trach.  Now resolved Tracheal aspirate growing Enterobacter aerogenes Continue Glycopyrrolate for secretions.  AcuteMetabolic Encephalopathy:  >>>Resolved. Patient had a sudden onset of slurred speech and altered mental status. Differential including mucous plug, aspiration pneumoniaversus acute intracranial hemorrhage. Stat ABG showed pH of 7.0. Patient was not responding, code stroke called. CT scan ruled out intracranial hemorrhage. Patient does not have hypoglycemia. Patient was transferred to ICU. She is back to her baseline mental status. Stopping Narcotics one at a time (stopped norco on 1/26)   Severe metabolic acidosis>> resolved Etiology still unclear, it appears to be sudden onset.  Diarrhea: >>> Nursing reports 2 large loose bowel movement on 1/28. Patient has been on antibiotics but no leukocytosis or abdominal pain,   ID recommended no need for C. difficile testing. Could be tube feed related.  Could consider Imodium as needed  Dysphagia. Deconditioning Moderate protein calorie malnutrition. On TFs and tolerating  Anxiety :  Continue Klonopin.  Paroxysmal A. Fib:  Continue Eliquis, normal sinus rhythm. Heart rate increased. Metoprolol 50 mg twice daily resumed.    DVT prophylaxis:  Lovenox Code Status: Full code. Family Communication: No family at bed side. Disposition Plan:  Status is: Inpatient  Remains inpatient appropriate because:Inpatient level of care appropriate due to  severity of illness   Dispo: The patient is from: Home              Anticipated d/c is to: SNF               Anticipated d/c date is: > 3 days              Patient currently is not medically stable to d/c. TOC team working on placement/SNF versus CIR   Difficult to place patient Yes   Consultants:   PCCM.  Procedures: Tracheostomy and PEG tube.  Antimicrobials:   Anti-infectives (From admission, onward)   Start     Dose/Rate Route Frequency Ordered Stop   07/14/20 1100  piperacillin-tazobactam (ZOSYN) IVPB 3.375 g        3.375 g 12.5 mL/hr over 240 Minutes Intravenous Every 8 hours 07/14/20 0946 07/18/20 2359   07/08/20 1145  amoxicillin-clavulanate (AUGMENTIN) 875-125 MG per tablet 1 tablet  Status:  Discontinued        1 tablet Per Tube Every 12 hours 07/08/20 1055 07/11/20 0803   07/06/20 1530  piperacillin-tazobactam (ZOSYN) IVPB 3.375 g  Status:  Discontinued        3.375 g 12.5 mL/hr over 240 Minutes Intravenous Every 8 hours 07/06/20 1433 07/08/20 1055   06/21/20 1200  ceFAZolin (ANCEF) IVPB 2g/100 mL premix       Note to Pharmacy: Please have at bedside. Will be administered one hour prior to procedure. Endo staff will instruct on when to start infusion.   2 g 200 mL/hr over 30 Minutes Intravenous  Once 06/21/20 0820 06/21/20 2030   06/07/20 1400  azithromycin (ZITHROMAX) 500 mg in sodium chloride 0.9 % 250 mL IVPB  Status:  Discontinued        500 mg 250 mL/hr over 60 Minutes Intravenous Every 24 hours 06/06/20 2030 06/06/20 2044   06/07/20 1400  azithromycin (ZITHROMAX) 500 mg in sodium chloride 0.9 % 250 mL IVPB  Status:  Discontinued        500 mg 250 mL/hr over 60 Minutes Intravenous Every 24 hours 06/06/20 2044 06/08/20 1031   06/06/20 1415  cefTRIAXone (ROCEPHIN) 2 g in sodium chloride 0.9 % 100 mL IVPB        2 g 200 mL/hr over 30 Minutes Intravenous  Once 06/06/20 1407 06/06/20 1514   06/06/20 1415  azithromycin (ZITHROMAX) 500 mg in sodium chloride 0.9 % 250 mL IVPB        500 mg 250 mL/hr over 60 Minutes Intravenous  Once 06/06/20 1407 06/06/20 1708       Subjective: Patient was seen and examined at bedside.  Overnight events noted.  Patient seems improved and comfortable. She denies any pain, her blood pressure and heart rate has increased, her home metoprolol resumed.  Objective: Vitals:   07/23/20 0535 07/23/20 0737 07/23/20 0837 07/23/20 1120  BP: (!) 139/94  117/83 121/79  Pulse: (!) 115  (!) 104 80  Resp: (!) 22  (!) 21 18  Temp: 98.7 F (37.1 C)  98.4 F (36.9 C) 99.3 F (37.4 C)  TempSrc: Oral  Oral   SpO2: 94% 94% 100% 99%  Weight:      Height:        Intake/Output Summary (Last 24 hours) at 07/23/2020 1132 Last data filed at 07/22/2020 1513 Gross per 24 hour  Intake --  Output 1 ml  Net -1 ml   Filed Weights   07/16/20 0500 07/17/20 0426 07/18/20 0500  Weight:  56.4 kg 55.4 kg 54.9 kg    Examination:  General exam: Appears calm and comfortable , trach collar noted Respiratory system: Clear to auscultation. Respiratory effort normal. Cardiovascular system: S1 & S2 heard, RRR. No JVD, murmurs, rubs, gallops or clicks. No pedal edema. Gastrointestinal system: Abdomen is nondistended, soft and nontender. No organomegaly or masses felt. Normal bowel sounds heard.  PEG tube in place. Central nervous system: Alert and oriented. No focal neurological deficits. Extremities: Symmetric 5 x 5 power. Skin: No rashes, lesions or ulcers Psychiatry: Judgement and insight appear normal. Mood & affect appropriate.     Data Reviewed: I have personally reviewed following labs and imaging studies  CBC: Recent Labs  Lab 07/17/20 0427 07/18/20 0521 07/19/20 0430 07/23/20 0419  WBC 9.1 8.3 9.1 8.0  HGB 8.5* 8.3* 8.4* 9.9*  HCT 28.3* 27.8* 28.7* 32.7*  MCV 95.9 97.9 98.3 92.9  PLT 317 301 308 601   Basic Metabolic Panel: Recent Labs  Lab 07/17/20 0427 07/18/20 0521 07/19/20 0430 07/23/20 0419  NA 143 141 144 138  K 4.0 4.2 4.3 4.1  CL 96* 95* 97* 95*  CO2 36* 37* 41* 36*  GLUCOSE 90 92 89 91  BUN '19 17 14 16   ' CREATININE 0.68 0.58 0.46 0.54  CALCIUM 8.8* 8.6* 8.8* 9.4  MG 1.9 1.8 2.1 2.0  PHOS 3.1 2.4* 2.9 4.4   GFR: Estimated Creatinine Clearance: 52.5 mL/min (by C-G formula based on SCr of 0.54 mg/dL). Liver Function Tests: Recent Labs  Lab 07/17/20 0427  ALBUMIN 2.7*   No results for input(s): LIPASE, AMYLASE in the last 168 hours. No results for input(s): AMMONIA in the last 168 hours. Coagulation Profile: No results for input(s): INR, PROTIME in the last 168 hours. Cardiac Enzymes: No results for input(s): CKTOTAL, CKMB, CKMBINDEX, TROPONINI in the last 168 hours. BNP (last 3 results) No results for input(s): PROBNP in the last 8760 hours. HbA1C: No results for input(s): HGBA1C in the last 72 hours. CBG: Recent Labs  Lab 07/21/20 0020 07/21/20 0412 07/21/20 0828 07/21/20 1130 07/21/20 1611  GLUCAP 109* 90 80 92 88   Lipid Profile: No results for input(s): CHOL, HDL, LDLCALC, TRIG, CHOLHDL, LDLDIRECT in the last 72 hours. Thyroid Function Tests: No results for input(s): TSH, T4TOTAL, FREET4, T3FREE, THYROIDAB in the last 72 hours. Anemia Panel: No results for input(s): VITAMINB12, FOLATE, FERRITIN, TIBC, IRON, RETICCTPCT in the last 72 hours. Sepsis Labs: No results for input(s): PROCALCITON, LATICACIDVEN in the last 168 hours.  Recent Results (from the past 240 hour(s))  Culture, respiratory (non-expectorated)     Status: None   Collection Time: 07/13/20  4:20 PM   Specimen: Tracheal Aspirate; Respiratory  Result Value Ref Range Status   Specimen Description   Final    TRACHEAL ASPIRATE Performed at [facility], [address] [place], [place] [address]    Special Requests   Final    NONE Performed at [facility], [place], [place] [address]    Gram Stain NO WBC SEEN ABUNDANT GRAM POSITIVE RODS   Final   Culture   Final    FEW ENTEROBACTER AEROGENES MODERATE DIPHTHEROIDS(CORYNEBACTERIUM SPECIES) Standardized  susceptibility testing for this organism is not available. Performed at [facility], [place] [address], [place], [place] [address]    Report Status 07/17/2020 FINAL  Final   Organism ID, Bacteria ENTEROBACTER AEROGENES  Final      Susceptibility   Enterobacter aerogenes - MIC*  CEFAZOLIN >=64 RESISTANT Resistant     CEFEPIME <=0.12 SENSITIVE Sensitive     CEFTAZIDIME <=1 SENSITIVE Sensitive     CEFTRIAXONE <=0.25 SENSITIVE Sensitive     CIPROFLOXACIN <=0.25 SENSITIVE Sensitive     GENTAMICIN <=1 SENSITIVE Sensitive     IMIPENEM 1 SENSITIVE Sensitive     TRIMETH/SULFA <=20 SENSITIVE Sensitive     PIP/TAZO <=4 SENSITIVE Sensitive     * FEW ENTEROBACTER AEROGENES    Radiology Studies: No results found.  Scheduled Meds: . acidophilus  1 capsule Oral TID  . apixaban  5 mg Per Tube BID  . arformoterol  15 mcg Nebulization BID  . chlorhexidine gluconate (MEDLINE KIT)  15 mL Mouth Rinse BID  . clonazePAM  1 mg Per Tube BID  . feeding supplement (KATE FARMS STANDARD 1.4)  325 mL Per Tube QID  . fentaNYL  1 patch Transdermal Q72H  . fiber  1 packet Per Tube TID  . free water  120 mL Per Tube QID  . gabapentin  300 mg Per Tube TID  . glycopyrrolate  1 mg Per Tube TID  . mouth rinse  15 mL Mouth Rinse 10 times per day  . metoprolol tartrate  50 mg Oral BID  . pantoprazole sodium  40 mg Per Tube QHS  . QUEtiapine  25 mg Per Tube QHS   Continuous Infusions:   LOS: 47 days    Time spent: 25 mins.    Shawna Clamp, MD [facility]   If 7PM-7AM, please contact night-coverage

## 2020-07-24 DIAGNOSIS — J9621 Acute and chronic respiratory failure with hypoxia: Secondary | ICD-10-CM | POA: Diagnosis not present

## 2020-07-24 DIAGNOSIS — G9341 Metabolic encephalopathy: Secondary | ICD-10-CM | POA: Diagnosis not present

## 2020-07-24 DIAGNOSIS — J9622 Acute and chronic respiratory failure with hypercapnia: Secondary | ICD-10-CM | POA: Diagnosis not present

## 2020-07-24 LAB — BASIC METABOLIC PANEL
Anion gap: 9 (ref 5–15)
BUN: 18 mg/dL (ref 8–23)
CO2: 35 mmol/L — ABNORMAL HIGH (ref 22–32)
Calcium: 9.5 mg/dL (ref 8.9–10.3)
Chloride: 97 mmol/L — ABNORMAL LOW (ref 98–111)
Creatinine, Ser: 0.61 mg/dL (ref 0.44–1.00)
GFR, Estimated: >60 mL/min (ref >60)
Glucose, Bld: 81 mg/dL (ref 70–99)
Potassium: 4 mmol/L (ref 3.5–5.1)
Sodium: 141 mmol/L (ref 135–145)

## 2020-07-24 LAB — CBC
HCT: 32.2 % — ABNORMAL LOW (ref 36.0–46.0)
Hemoglobin: 10 g/dL — ABNORMAL LOW (ref 12.0–15.0)
MCH: 28.6 pg (ref 26.0–34.0)
MCHC: 31.1 g/dL (ref 30.0–36.0)
MCV: 92 fL (ref 80.0–100.0)
Platelets: 369 10*3/uL (ref 150–400)
RBC: 3.5 MIL/uL — ABNORMAL LOW (ref 3.87–5.11)
RDW: 15.9 % — ABNORMAL HIGH (ref 11.5–15.5)
WBC: 6.8 10*3/uL (ref 4.0–10.5)
nRBC: 0 % (ref 0.0–0.2)

## 2020-07-24 LAB — MAGNESIUM: Magnesium: 2 mg/dL (ref 1.7–2.4)

## 2020-07-24 LAB — PHOSPHORUS: Phosphorus: 5.2 mg/dL — ABNORMAL HIGH (ref 2.5–4.6)

## 2020-07-24 NOTE — Progress Notes (Signed)
Occupational Therapy Treatment Patient Details Name: Virginia [name] MRN: [id number] DOB: 1951/03/17 Today's Date: 07/24/2020    History of present illness 70 yo female with severe COPD admitted with acute on chronic hypoxic hypercapnic respiratory failure and acute encephalopathy secondary to narcotic use and AECOPD requiring mechanical intubation. Initially intubated from 12/14-12/16 and then again from 12/18-12/23. Now with + trach collar. Recent hospitalization from 12/10-12/12/21. RR secondary to slurred speech and AMS. Transferred to CCU on 1/20 on full vent support. RE-evaluation performed this date as medical status has improved.   OT comments  [name] was seen for OT/PT co-treatment on this date. Upon arrival to room pt seated EOB with PT in room. MIN A for seated LBD, MAX A don brief in standing. MIN A + HHA for ADL t/f. Pt ambulated 30 ft x3 trials this date. Pt reports anxiousness limiting her, requires ~4 min seated rest break to recover between trials. Pt making good progress toward goals. Pt continues to benefit from skilled OT services to maximize return to PLOF and minimize risk of future falls, injury, caregiver burden, and readmission. Will continue to follow POC. Discharge recommendation remains appropriate.    Follow Up Recommendations  CIR;SNF    Equipment Recommendations  Other (comment) (TBD)    Recommendations for Other Services      Precautions / Restrictions Precautions Precautions: Fall Precaution Comments: trach Restrictions Weight Bearing Restrictions: No Other Position/Activity Restrictions: HR/ RR       Mobility Bed Mobility Overal bed mobility: Needs Assistance Bed Mobility: Sit to Supine       Sit to supine: Min assist      Transfers Overall transfer level: Needs assistance Equipment used: Rolling walker (2 wheeled) Transfers: Sit to/from Stand Sit to Stand: Min assist              Balance Overall balance assessment: Needs  assistance Sitting-balance support: Feet supported Sitting balance-Leahy Scale: Good     Standing balance support: Bilateral upper extremity supported;During functional activity Standing balance-Leahy Scale: Good Standing balance comment: much safer with BUE support versus single UE support                           ADL either performed or assessed with clinical judgement   ADL Overall ADL's : Needs assistance/impaired                                       General ADL Comments: MIN A for seated LBD, MAX A don brief in standing. MIN A + HHA for ADL t/f.               Cognition Arousal/Alertness: Awake/alert Behavior During Therapy: WFL for tasks assessed/performed Overall Cognitive Status: Within Functional Limits for tasks assessed                                 General Comments: able to mouth words clearly. cooperative however pt does get slightly anxious once fatigued        Exercises Exercises: Other exercises Other Exercises Other Exercises: Pt educated re: OT role, DME recs, falls prevention, ECS Other Exercises: LBD, UBD, sup<>sit, sit<>stand, sitting/standing balance/tolerance           Pertinent Vitals/ Pain       Pain Assessment: No/denies pain Faces Pain  Scale: No hurt         Frequency  Min 1X/week        Progress Toward Goals  OT Goals(current goals can now be found in the care plan section)  Progress towards OT goals: Progressing toward goals  Acute Rehab OT Goals Patient Stated Goal: To return home safely OT Goal Formulation: With patient Time For Goal Achievement: 07/31/20 Potential to Achieve Goals: Good ADL Goals Pt Will Perform Grooming: with modified independence;sitting Pt Will Perform Lower Body Dressing: sit to/from stand;with supervision Pt Will Transfer to Toilet: with supervision;ambulating;bedside commode  Plan Frequency remains appropriate;Discharge plan needs to be updated     Co-evaluation    PT/OT/SLP Co-Evaluation/Treatment: Yes Reason for Co-Treatment: Complexity of the patient's impairments (multi-system involvement);To address functional/ADL transfers PT goals addressed during session: Mobility/safety with mobility;Balance;Proper use of DME OT goals addressed during session: ADL's and self-care      AM-PAC OT "6 Clicks" Daily Activity     Outcome Measure   Help from another person eating meals?: A Little Help from another person taking care of personal grooming?: A Little Help from another person toileting, which includes using toliet, bedpan, or urinal?: A Little Help from another person bathing (including washing, rinsing, drying)?: A Little Help from another person to put on and taking off regular upper body clothing?: A Little Help from another person to put on and taking off regular lower body clothing?: A Little 6 Click Score: 18    End of Session Equipment Utilized During Treatment: Oxygen;Other (comment) (trach collar)  OT Visit Diagnosis: Other abnormalities of gait and mobility (R26.89);Muscle weakness (generalized) (M62.81)   Activity Tolerance Patient tolerated treatment well   Patient Left in bed;with call bell/phone within reach;with bed alarm set   Nurse Communication Mobility status        Time: 4709-6283 OT Time Calculation (min): 32 min  Charges: OT General Charges $OT Visit: 1 Visit OT Treatments $Self Care/Home Management : 8-22 mins  Kathie Dike, M.S. OTR/L  07/24/20, 2:36 PM  ascom [phone]

## 2020-07-24 NOTE — Progress Notes (Addendum)
PROGRESS NOTE    Virginia [name]  IRS:[id number] DOB: 1951-06-10 DOA: 06/06/2020 PCP: Idelle Crouch, MD    Brief Narrative: This 70 years old female with severe COPD admitted with acute on chronic hypoxic and hypercapnic respiratory failure and acute encephalopathy secondary to narcotic use and advanced COPD requiring mechanical ventilation.  She was treated with steroids and IV antibiotics.  She could not be liberated from the ventilator so tracheostomy was performed and she has been receiving oxygen via trach collar. Patient developed worsening hypoxia on 1/15.  Chest x-ray shows bilateral lower lobe infiltrate consistent with aspiration pneumonia. She has completed antibiotics.  On 1/20 patient had a sudden onset of altered mental status,  ABG shows severe metabolic acidosis. Patient was transferred to ICU. PCCM pickup 1/24: Acute encephalopathy resolved.  CT head unremarkable for any acute stroke or bleeding.  Mental status has normalized without any focal deficit. COPD appears stable.  1/26 -> transfer to med-surg. Waiting for LTAC decision. 1/27-> LTAC declined.  Waiting for SNF placement. 1/28 -> oxygen requirement up to 10 L after PT/OT session.  Nursing reporting 2 large loose bowel movement today. 1/29: > RRT called, O2 saturation dropped to 70s.  Found to have mucous plugging in the trach which was removed,  O2 saturation improved. 1/30- > Patient appears improved.  She is lying comfortably.  Oxygen requirement via trach 10 L. 1/31-> Given severe baseline COPD, would be difficult to wean trach.    Assessment & Plan:   Active Problems:   AF (paroxysmal atrial fibrillation) (HCC)   Anemia of chronic disease   COPD with acute lower respiratory infection ([place])   Dysphagia   Respiratory failure (HCC)   COPD exacerbation (HCC)   Acute urinary retention   Acute metabolic encephalopathy   Acute on chronic respiratory failure with hypoxia and hypercapnia (HCC)   Aspiration  pneumonia of both lower lobes due to gastric secretions (HCC)   Reactive thrombocytosis   Pressure injury of skin   Diarrhea   Acute on chronic hypoxic and hypercapnic respiratory failure: COPD exacerbation: Stable. Aspiration pneumonia :  Completed IV zosyn on 1/25. She is on trach collar, O2 sats 97% on 10 L, wean as able  She had a transient episode of moderate respiratory distress earlier due to blocked mucous/obstructed trach.  Now resolved Tracheal aspirate growing Enterobacter aerogenes Continue Glycopyrrolate for secretions. She is satting 97% on trach @ 8L.  AcuteMetabolic Encephalopathy:  >>>Resolved. Patient had a sudden onset of slurred speech and altered mental status. Differential including mucous plug, aspiration pneumoniaversus acute intracranial hemorrhage. Stat ABG showed pH of 7.0. Patient was not responding, code stroke called. CT scan ruled out intracranial hemorrhage. Patient does not have hypoglycemia. Patient was transferred to ICU. She is back to her baseline mental status. Stopping Narcotics one at a time (stopped norco on 1/26)   Severe metabolic acidosis>> resolved. Etiology still unclear, it appears to be sudden onset.  Diarrhea: >>> Nursing reports 2 large loose bowel movement on 1/28. Patient has been on antibiotics but no leukocytosis or abdominal pain,   ID recommended no need for C. difficile testing. Could be tube feed related.  Could consider Imodium as needed  Dysphagia. Deconditioning Moderate protein calorie malnutrition. On TFs and tolerating  Anxiety :  Continue Klonopin and valium for breakthrough.  Agitation : Remains on Precedex drip for agitation. Try to wean today. Continue Klonopin and Valium.  Paroxysmal A. Fib:  Continue Eliquis, normal sinus rhythm. Heart rate now controlled.  Metoprolol 50 mg twice daily resumed.    DVT prophylaxis:  Lovenox Code Status: Full code. Family Communication: No family at bed  side. Disposition Plan:  Status is: Inpatient  Remains inpatient appropriate because:Inpatient level of care appropriate due to severity of illness   Dispo: The patient is from: Home              Anticipated d/c is to: SNF              Anticipated d/c date is: > 3 days              Patient currently is not medically stable to d/c. TOC team working on placement/SNF versus CIR   Difficult to place patient Yes   Consultants:   PCCM.  Procedures: Tracheostomy and PEG tube.  Antimicrobials:   Anti-infectives (From admission, onward)   Start     Dose/Rate Route Frequency Ordered Stop   07/14/20 1100  piperacillin-tazobactam (ZOSYN) IVPB 3.375 g        3.375 g 12.5 mL/hr over 240 Minutes Intravenous Every 8 hours 07/14/20 0946 07/18/20 2359   07/08/20 1145  amoxicillin-clavulanate (AUGMENTIN) 875-125 MG per tablet 1 tablet  Status:  Discontinued        1 tablet Per Tube Every 12 hours 07/08/20 1055 07/11/20 0803   07/06/20 1530  piperacillin-tazobactam (ZOSYN) IVPB 3.375 g  Status:  Discontinued        3.375 g 12.5 mL/hr over 240 Minutes Intravenous Every 8 hours 07/06/20 1433 07/08/20 1055   06/21/20 1200  ceFAZolin (ANCEF) IVPB 2g/100 mL premix       Note to Pharmacy: Please have at bedside. Will be administered one hour prior to procedure. Endo staff will instruct on when to start infusion.   2 g 200 mL/hr over 30 Minutes Intravenous  Once 06/21/20 0820 06/21/20 2030   06/07/20 1400  azithromycin (ZITHROMAX) 500 mg in sodium chloride 0.9 % 250 mL IVPB  Status:  Discontinued        500 mg 250 mL/hr over 60 Minutes Intravenous Every 24 hours 06/06/20 2030 06/06/20 2044   06/07/20 1400  azithromycin (ZITHROMAX) 500 mg in sodium chloride 0.9 % 250 mL IVPB  Status:  Discontinued        500 mg 250 mL/hr over 60 Minutes Intravenous Every 24 hours 06/06/20 2044 06/08/20 1031   06/06/20 1415  cefTRIAXone (ROCEPHIN) 2 g in sodium chloride 0.9 % 100 mL IVPB        2 g 200 mL/hr over 30  Minutes Intravenous  Once 06/06/20 1407 06/06/20 1514   06/06/20 1415  azithromycin (ZITHROMAX) 500 mg in sodium chloride 0.9 % 250 mL IVPB        500 mg 250 mL/hr over 60 Minutes Intravenous  Once 06/06/20 1407 06/06/20 1708      Subjective: Patient was seen and examined at bedside.  Overnight events noted.  Patient seems improved and comfortable.  Patient feels better and wants to go home.  She used [employer] valve to communicate. She reports having multiple people at home to take care of her and wants to be discharged.  Objective: Vitals:   07/23/20 2046 07/24/20 0427 07/24/20 0736 07/24/20 0852  BP:  115/78  117/88  Pulse:  75 85 93  Resp:  _0 Temp:  97.6 F (36.4 C)  98 F (36.7 C)  TempSrc:  Oral  Oral  SpO2: 94% 95% 95% 95%  Weight:  Height:       No intake or output data in the 24 hours ending 07/24/20 1047 Filed Weights   07/16/20 0500 07/17/20 0426 07/18/20 0500  Weight: 56.4 kg 55.4 kg 54.9 kg    Examination:  General exam: Appears calm and comfortable , trach collar noted Respiratory system: Clear to auscultation. Respiratory effort normal. Cardiovascular system: S1 & S2 heard, RRR. No JVD, murmurs, rubs, gallops or clicks. No pedal edema. Gastrointestinal system: Abdomen is nondistended, soft and nontender. No organomegaly or masses felt. Normal bowel sounds heard.  PEG tube in place. Central nervous system: Alert and oriented. No focal neurological deficits. Extremities: Symmetric 5 x 5 power. Skin: No rashes, lesions or ulcers Psychiatry: Judgement and insight appear normal. Mood & affect appropriate.     Data Reviewed: I have personally reviewed following labs and imaging studies  CBC: Recent Labs  Lab 07/18/20 0521 07/19/20 0430 07/23/20 0419 07/24/20 0528  WBC 8.3 9.1 8.0 6.8  HGB 8.3* 8.4* 9.9* 10.0*  HCT 27.8* 28.7* 32.7* 32.2*  MCV 97.9 98.3 92.9 92.0  PLT 301 308 359 979   Basic Metabolic Panel: Recent Labs  Lab  07/18/20 0521 07/19/20 0430 07/23/20 0419 07/24/20 0528  NA 141 144 138 141  K 4.2 4.3 4.1 4.0  CL 95* 97* 95* 97*  CO2 37* 41* 36* 35*  GLUCOSE 92 89 91 81  BUN _0 CREATININE 0.58 0.46 0.54 0.61  CALCIUM 8.6* 8.8* 9.4 9.5  MG 1.8 2.1 2.0 2.0  PHOS 2.4* 2.9 4.4 5.2*   GFR: Estimated Creatinine Clearance: 52.5 mL/min (by C-G formula based on SCr of 0.61 mg/dL). Liver Function Tests: No results for input(s): AST, ALT, ALKPHOS, BILITOT, PROT, ALBUMIN in the last 168 hours. No results for input(s): LIPASE, AMYLASE in the last 168 hours. No results for input(s): AMMONIA in the last 168 hours. Coagulation Profile: No results for input(s): INR, PROTIME in the last 168 hours. Cardiac Enzymes: No results for input(s): CKTOTAL, CKMB, CKMBINDEX, TROPONINI in the last 168 hours. BNP (last 3 results) No results for input(s): PROBNP in the last 8760 hours. HbA1C: No results for input(s): HGBA1C in the last 72 hours. CBG: Recent Labs  Lab 07/21/20 0412 07/21/20 0828 07/21/20 1130 07/21/20 1611 07/23/20 2112  GLUCAP 90 80 92 88 110*   Lipid Profile: No results for input(s): CHOL, HDL, LDLCALC, TRIG, CHOLHDL, LDLDIRECT in the last 72 hours. Thyroid Function Tests: No results for input(s): TSH, T4TOTAL, FREET4, T3FREE, THYROIDAB in the last 72 hours. Anemia Panel: No results for input(s): VITAMINB12, FOLATE, FERRITIN, TIBC, IRON, RETICCTPCT in the last 72 hours. Sepsis Labs: No results for input(s): PROCALCITON, LATICACIDVEN in the last 168 hours.  No results found for this or any previous visit (from the past 240 hour(s)).  Radiology Studies: No results found.  Scheduled Meds: . acidophilus  1 capsule Oral TID  . apixaban  5 mg Per Tube BID  . arformoterol  15 mcg Nebulization BID  . chlorhexidine gluconate (MEDLINE KIT)  15 mL Mouth Rinse BID  . clonazePAM  1 mg Per Tube BID  . feeding supplement ([employer] STANDARD 1.4)  325 mL Per Tube QID  . fentaNYL  1 patch  Transdermal Q72H  . fiber  1 packet Per Tube TID  . free water  120 mL Per Tube QID  . gabapentin  300 mg Per Tube TID  . glycopyrrolate  1 mg Per Tube TID  . mouth rinse  15  mL Mouth Rinse 10 times per day  . metoprolol tartrate  50 mg Oral BID  . pantoprazole sodium  40 mg Per Tube QHS  . QUEtiapine  25 mg Per Tube QHS   Continuous Infusions:   LOS: 48 days    Time spent: 25 mins.    Shawna Clamp, MD [facility]   If 7PM-7AM, please contact night-coverage

## 2020-07-24 NOTE — Progress Notes (Signed)
Nutrition Follow-up  DOCUMENTATION CODES:   Not applicable  INTERVENTION:  Continue [employer] Standard 1.4 Cal 1 carton (325 mL) QID per tube. Provides 1820 kcal, 80 grams of protein, 936 mL H2O daily.  Continue free water flush of 60 mL before and after each bolus feeding. Provides a total of 1416 mL H2O daily including water in tube feeding.  NUTRITION DIAGNOSIS:   Increased nutrient needs related to catabolic illness (COPD) as evidenced by estimated needs.  Ongoing.  GOAL:   Patient will meet greater than or equal to 90% of their needs  Met.  MONITOR:   Labs,Weight trends,TF tolerance,Skin,I & O's  REASON FOR ASSESSMENT:   Ventilator,Consult Assessment of nutrition requirement/status,Enteral/tube feeding initiation and management  ASSESSMENT:   70 year old female with PMHx of COPD, hx tracheostomy tube placement and PEG tube placement in 2017, HTN, depression, RLS, osteoporosis, hepatitis C, A-fib, anxiety, arthritis admitted with acute on chronic hypoxic hypercapnic respiratory failure and acute encephalopathy secondary to narcotic use and acute exacerbation of COPD.  12/14 intubated 12/16 extubated 12/18 reintubated and TFs restarted 12/23 s/p tracheostomy tube placement 12/23 Dobbhoff tube was placed after patient returned from trach and TFs were restarted 12/25 pt pulled out Dobbhoff tube and refused replacement 12/29 s/p PEG tube placement 1/5 pt changed to bolus regimen with Jevity 1.2 Cal  1/11 pt changed to bolus regimen with Osmolite 1.2 Cal 1/20 placed back on ventilator support via trach 1/22 placed back on trach collar 1/24 TF formula changed to [employer]  Met with patient at bedside. She reports she is tolerating [employer] formula. Denies any N/V or abdominal pain. She appreciates that with this formula there are only 4 cartons per day. Per review of chart patient continues to have diarrhea. Patient is receiving 12 grams of soluble fiber from [facility] and 9 grams of soluble fiber from [employer].  Medications reviewed and include: acidophilus 1 capsule TID, fentanyl patch, [employer] Fiber 1 packet TID per tube, free water 120 mL QID per tube, Protonix.  Labs reviewed: CBG 88-110, Chloride 97, CO2 35, Phosphorus 5.2.  Enteral Access: 20 Fr. Endovive Safety G-tube  TF regimen: [employer] 1 carton QID per tube  I/O: 3 occurrences unmeasured UOP yesterday  Weight trend: 54.9 kg on 1/25; -4 kg from 12/14  Diet Order:   Diet Order            Diet NPO time specified  Diet effective now                EDUCATION NEEDS:   No education needs have been identified at this time  Skin:  Skin Assessment: Skin Integrity Issues: Skin Integrity Issues:: Other (Comment),Stage II Stage II: left buttocks Incisions: N/A Other: MASD to groin and perineum  Last BM:  07/23/2020 - medium type 7  Height:   Ht Readings from Last 1 Encounters:  07/14/20 5' 2.01" (1.575 m)   Weight:   Wt Readings from Last 1 Encounters:  07/18/20 54.9 kg   Ideal Body Weight:  50 kg  BMI:  Body mass index is 22.13 kg/m.  Estimated Nutritional Needs:   Kcal:  1500-1700kcal/day  Protein:  75-85g/day  Fluid:  1.5-1.7 L/day  Jacklynn Barnacle, MS, RD, LDN Pager number available on Amion

## 2020-07-24 NOTE — Progress Notes (Signed)
Physical Therapy Treatment Patient Details Name: Virginia [name] MRN: [id number] DOB: 05/06/1951 Today's Date: 07/24/2020    History of Present Illness 70 yo female with severe COPD admitted with acute on chronic hypoxic hypercapnic respiratory failure and acute encephalopathy secondary to narcotic use and AECOPD requiring mechanical intubation. Initially intubated from 12/14-12/16 and then again from 12/18-12/23. Now with + trach collar. Recent hospitalization from 12/10-12/12/21. RR secondary to slurred speech and AMS. Transferred to CCU on 1/20 on full vent support. RE-evaluation performed this date as medical status has improved.    PT Comments    Pt seen for PT/OT co-tx for pt safety as pt with multiple lines/tubing. Pt is motivated to participate & is able to perform BLE strengthening exercises seated EOB. Pt is able to ambulate short distances x 3 trials with RW & min assist with 2nd person for chair follow as pt requires frequent seated rest breaks 2/2 SOB that is exacerbated with pt's anxiety, as well as fatigue. PT/OT educate pt on pursed lip breathing & relaxation techniques throughout session. Encouraged pt to sit up in recliner but prefers to rest in bed at this time; educated pt on importance of OOB mobility. Pt with poor memory as she doesn't recall last PT session despite PT providing cuing. Pt is motivated to participate, eager to ambulate today, & would benefit from intensive therapies at CIR to maximize independence with functional mobility & reduce fall risk prior to return home as pt has become very deconditioned 2/2 medical issues & prolonged hospitalization.     Follow Up Recommendations  CIR;Supervision/Assistance - 24 hour;Supervision for mobility/OOB     Equipment Recommendations  None recommended by PT (TBD in next venue)    Recommendations for Other Services       Precautions / Restrictions Precautions Precautions: Fall Precaution Comments:  trach Restrictions Weight Bearing Restrictions: No Other Position/Activity Restrictions: HR/ RR    Mobility  Bed Mobility Overal bed mobility: Needs Assistance Bed Mobility: Supine to Sit;Sit to Supine     Supine to sit: Supervision;HOB elevated Sit to supine: Min assist;HOB elevated      Transfers Overall transfer level: Needs assistance Equipment used: Rolling walker (2 wheeled) Transfers: Sit to/from Stand Sit to Stand: Min guard         General transfer comment: cuing for proper/safe hand placement as pt initially attempts to push down on RW with BUE for sit>stand, would benefit from ongoing education  Ambulation/Gait Ambulation/Gait assistance: Min assist Gait Distance (Feet): 30 Feet (27 ft + 20 ft) Assistive device: Rolling walker (2 wheeled) Gait Pattern/deviations: Decreased step length - left;Decreased step length - right;Decreased stride length Gait velocity: decreased       Stairs             Wheelchair Mobility    Modified Rankin (Stroke Patients Only)       Balance Overall balance assessment: Needs assistance Sitting-balance support: Feet supported Sitting balance-Leahy Scale: Good Sitting balance - Comments: no balance deficits sitting EOB   Standing balance support: Bilateral upper extremity supported;During functional activity Standing balance-Leahy Scale: Fair Standing balance comment: requires BUE support on RW for gait                            Cognition Arousal/Alertness: Awake/alert Behavior During Therapy: WFL for tasks assessed/performed Overall Cognitive Status: Within Functional Limits for tasks assessed  General Comments: able to mouth words clearly. cooperative however pt does get slightly anxious once fatigued      Exercises General Exercises - Lower Extremity Long Arc Quad: AROM;Right;Left;Both;Strengthening;10 reps (x 2 sets sitting EOB) Other  Exercises Other Exercises: Pt educated re: OT role, DME recs, falls prevention, ECS Other Exercises: LBD, UBD, sup<>sit, sit<>stand, sitting/standing balance/tolerance    General Comments General comments (skin integrity, edema, etc.): Pt on 10L/min 40% FiO2 with trach collar attached to wall. Utilized 10 L/min and 45% FiO2 during gait with portable tank. SPO2 lowest 86%, therapists educated pt on pursed lip breathing & rest breaks required between each ambulation trial. SPO2 quickly returns to >/= 90%.      Pertinent Vitals/Pain Pain Assessment: Faces Faces Pain Scale: Hurts a little bit Pain Location: abdomen - G tube site at end of session Pain Descriptors / Indicators: Discomfort Pain Intervention(s): Monitored during session    Home Living                      Prior Function            PT Goals (current goals can now be found in the care plan section) Acute Rehab PT Goals Patient Stated Goal: To return home safely PT Goal Formulation: With patient Time For Goal Achievement: 07/30/20 Potential to Achieve Goals: Fair Progress towards PT goals: Progressing toward goals    Frequency    Min 2X/week      PT Plan Discharge plan needs to be updated    Co-evaluation PT/OT/SLP Co-Evaluation/Treatment: Yes Reason for Co-Treatment: For patient/therapist safety;Complexity of the patient's impairments (multi-system involvement) PT goals addressed during session: Mobility/safety with mobility;Balance;Proper use of DME OT goals addressed during session: ADL's and self-care      AM-PAC PT "6 Clicks" Mobility   Outcome Measure  Help needed turning from your back to your side while in a flat bed without using bedrails?: None Help needed moving from lying on your back to sitting on the side of a flat bed without using bedrails?: A Little Help needed moving to and from a bed to a chair (including a wheelchair)?: A Little Help needed standing up from a chair using your  arms (e.g., wheelchair or bedside chair)?: A Little Help needed to walk in hospital room?: A Lot Help needed climbing 3-5 steps with a railing? : A Lot 6 Click Score: 17    End of Session Equipment Utilized During Treatment: Gait belt;Oxygen Activity Tolerance: Patient limited by fatigue;Patient tolerated treatment well Patient left: in bed;with bed alarm set;with call bell/phone within reach Nurse Communication: Mobility status PT Visit Diagnosis: Unsteadiness on feet (R26.81);Muscle weakness (generalized) (M62.81);Difficulty in walking, not elsewhere classified (R26.2)     Time: 9024-0973 PT Time Calculation (min) (ACUTE ONLY): 42 min  Charges:  $Therapeutic Activity: 23-37 mins                     Aleda Grana, PT, DPT 07/24/20, 2:50 PM    Sandi Mariscal 07/24/2020, 2:47 PM

## 2020-07-24 NOTE — Evaluation (Signed)
[facility] Speaking Valve - Evaluation Patient Details  Name: Virginia [name] MRN: [id number] Date of Birth: Feb 03, 1951  Today's Date: 07/24/2020 Time: 1400-1530 SLP Time Calculation (min) (ACUTE ONLY): 90 min  Past Medical History:  Past Medical History:  Diagnosis Date  . Anemia   . Anxiety   . Arthritis   . Atrial fibrillation (HCC)   . C. difficile colitis 09/2015  . COPD (chronic obstructive pulmonary disease) (HCC)   . Depression   . Dyspnea   . Dysrhythmia   . Fibromyalgia   . H/O tracheostomy   . Hep C w/ coma, chronic   . Hypertension   . MRSA pneumonia (HCC) 2017  . On home oxygen therapy    3 L/M   . Osteoporosis   . Peripheral neuropathy   . RLS (restless legs syndrome)   . S/P percutaneous endoscopic gastrostomy (PEG) tube placement (HCC) 09/2015  . Ventilator associated pneumonia [facility]) 10/2015   [facility] [place]   Past Surgical History:  Past Surgical History:  Procedure Laterality Date  . ABDOMINAL HYSTERECTOMY    . BREAST SURGERY Bilateral    Breast Implants  . DILATION AND CURETTAGE OF UTERUS    . PEG PLACEMENT N/A 06/21/2020   Procedure: PERCUTANEOUS ENDOSCOPIC GASTROSTOMY (PEG) PLACEMENT;  Surgeon: Regis Bill, MD;  Location: [facility] ENDOSCOPY;  Service: Endoscopy;  Laterality: N/A;  . REVERSE SHOULDER ARTHROPLASTY Right 03/06/2017   Procedure: REVERSE SHOULDER ARTHROPLASTY;  Surgeon: Christena Flake, MD;  Location: [facility] ORS;  Service: Orthopedics;  Laterality: Right;  . ROTATOR CUFF REPAIR Bilateral   . TRACHEOSTOMY TUBE PLACEMENT N/A 06/15/2020   Procedure: TRACHEOSTOMY;  Surgeon: Geanie Logan, MD;  Location: [facility] ORS;  Service: ENT;  Laterality: N/A;   HPI:  Pt is a 70 yo female with severe COPD admitted with acute on chronic hypoxic hypercapnic respiratory failure and acute encephalopathy secondary to narcotic use and AECOPD requiring mechanical intubation. Pt received both Tracheostomy and PEG placement. She is currently weaning  from vent to TC. Per pt report, this is pt's Second time w/ a Tracheostomy and PEG.  She was recently hospitalized from 12/10 to 12/12 following treatment of AECOPD.  She presented back to [facility] ER on 12/14 via EMS with altered mental status, cough, worsening shortness of breath, and wheezing. Pts daughter reports she has been taking narcotics along with seroquel. However, due to lack of improvement of mentation at admit, she required mechanical intubation. COVID-19/Influenza PCR and CXR negative. She was subsequently admitted to ICU for additional workup and treatment.   Assessment / Plan / Recommendation Clinical Impression  Pt seen today for repeat PMV and swallowing assessment. Ptresting in bed w/ min SOB noted at rest. Due to episode of low O2 sats and poor tolerance for PMV use ~2 weeks ago, PMV use was put on Hold; pt also had a transfer to CCU d/t CO2 issues. Pt is eager to "eat and get rid of this" referring to the PEG. She is easily SOB w/ ANY exertion while talking w/ SLP and sitting in bed.   SLP assessed O2 sats prior to PMV while pt was at rest/baseline. O2 sats 92-99% on the 10L O2. Pt A/O x3 w/ verbal communication; talking over the trach. She stated no significant difficulty w/ exhalation or discomfort breathing at rest but continued to note is is effortful w/ any exertion. No pain endorsed. PMV was placed briefly for ~15 mins.Intelligibility and volume of speech were grossly adequate in a quiet setting; vocal quality  appeared stronger and clear vs at previous eval/tx sessions(indicating vocal cord healing). Pt alert w/ good eye contact and communication but endorses SOB andWOB. O2 sats remained 92-95% during majority of PMV placement. Pt was encouraged tositquietly and justfocus on herbreathingw/the PMVplaced and focus on calm breathing w/ full inhalation/exhalation pattern -- this was modeled by SLP. When O2 sats were appropriate, po's were given.  She has successfully weaned from  Vent to [facility] -- tolerating full TC wear. Pt has a Shiley size 6, cuffed w/ cuff Deflated at Baseline. Pt endorsed she felt she has not received "consistent" tracheal suctioning and breathing treatments which makes breathing "better". PMVuseand placementwas explained to ptagain but this time the concerns of pt's WOB/SOB and breathing w/ low O2 sats are factors indicating PMV wear for short periods of time currently. Pt appears to exhibit min decreased awareness and insight into the severity of her Pulmonary issues; pt has had a PEG and Tracheostomy w/ PMV in pastand weaned from dependence on both; it seems pt is expecting to do the same this time. Pt also appears to have increased anxiety level which impacts her breathing. Discussed the process of PMV wear and tolerance and to not wear PMV if fatigued or w/ increased exertion. Pt gave verbal agreement.   During the swallowing assessment, PMV was in place for this evaluation. O2 sats 95%, RR low 20s. Pt appears to present w/ adequate oropharyngeal phase swallow w/ No oropharyngeal phase dysphagia noted, No neuromuscular deficits noted. Pt consumed po trials of thin liquids Via CUP w/ No immediate, overt, clinical s/s of aspiration during po trials. Pt appears at reduced risk for aspiration following general aspiration precautions and when wearing the PMV during all po intake. During po trials, pt consumed the thin liquid trials w/ no overt coughing, decline in vocal quality, or change in respiratory presentation during/post trials. Rest Breaks given to lessen any WOB - education given to pt on importance of this and to calm breathing. Oral phase appeared [facility] w/ timely bolus management and control of bolus propulsion for A-P transfer for swallowing. Oral clearing achieved. OM Exam appeared [facility] w/ no unilateral weakness noted. Speech Clear. Pt fed self w/ setup support. No trials of solids/foods were given this assessment d/t the nature of exertion w/  increased demand/texture. Pt has a PEG placement w/ TFs baseline for nutrition. Recommend a MBSS to objectively assess swallow function b/f initiation of an oral diet. Education given on the above and the recommendation for the MBSS; pt agreed. MD updated. NSG updated, agreed. Precautions posted in room on limited PMV wear w/ monitoring by NSG.. SLP Visit Diagnosis: Dysphagia, unspecified (R13.10);Aphonia (R49.1) (tracheotomy)    SLP Assessment  All further Speech Lanaguage Pathology  needs can be addressed in the next venue of care    Follow Up Recommendations  Skilled Nursing facility (TBD)    Frequency and Duration min 2x/week  1 week     PMSV Trial PMSV was placed for: ~15 mins Able to redirect subglottic air through upper airway: Yes Able to Attain Phonation: Yes Voice Quality: Normal (min decreased breath support for phrase/sentence length) Able to Expectorate Secretions: No attempts Level of Secretion Expectoration with PMSV: Not observed Breath Support for Phonation: Mildly decreased Intelligibility: Intelligible Phrase: 75-100% accurate Sentence: 50-74% accurate Conversation: 50-74% accurate Respirations During Trial: 23 SpO2 During Trial: 95 % Pulse During Trial: 88 Behavior: Alert;Controlled;Cooperative;Anxious;Expresses self well;Good eye contact;Responsive to questions   Tracheostomy Tube  Additional Tracheostomy Tube Assessment Janina Mayo  Collar Period: at baseline Secretion Description: min Frequency of Tracheal Suctioning: PRN Level of Secretion Expectoration: Tracheal    Vent Dependency  Vent Dependent: No    Cuff Deflation Trial  GO Tolerated Cuff Deflation: Yes Length of Time for Cuff Deflation Trial: baseline Behavior: Alert;Cooperative;Expresses self well;Good eye contact;Anxious;Oriented X3;Responsive to questions;Smiling Cuff Deflation Trial - Comments: at her Baseline        Watson,Katherine 07/24/2020, 5:54 PM Jerilynn Som, MS, CCC-SLP Speech  Language Pathologist Rehab Services [phone]

## 2020-07-24 NOTE — Progress Notes (Signed)
PULMONARY PROGRESS NOTE    Name: Virginia [name] MRN: [id number] DOB: 1950/08/04     LOS: 64   SUBJECTIVE FINDINGS & SIGNIFICANT EVENTS    Patient description:  70 yo female with severe COPD admitted with acute on chronic hypoxic hypercapnic respiratory failure and acute encephalopathy secondary to narcotic use and AECOPD requiring mechanical intubation  Now s/p trach/PEG working on trach weaning.  On TC at present, anxious. Remains on precedex.  06/27/20- patient is improved.  Signed out to [facility] - Dr Jimmye Norman for pick up in am.   06/29/20- patient is with respiratory distress, she has high volume phlegm from trache, ive suctioned out whats possible via [place] and messaged RT to assist.  RN was able to come in and evaluate patient and she was able to communicate appropriately.   07/07/20- patient is in bed resting. She is clinically better today then yesterday. Resting in bed in no distress.  07/08/20- Patient is lucid and awake.  Son is at bedside we discussed care plan.  CXR in am.   1/16-17/22- patient resting in bed comfortbably no acute events overnight.   07/11/20- patient had episode of mild respiratory distress due to voluminous phlegm expectorated via trache. This was suctioned by RN with recovery. Patient is clear to auscultation with occasional low pitch wheezes.  She is being optimized for d/c.   07/20/20- patient had episode of mild-moderate respiratory distress due to obstructed trache with inspissated phlegm.  She is on 7-8L/min humidifed HFnc.  She is with ENTEROBACTER AEROGENES on trache aspirate culture. ID is following and there is abscence of leucosytosis or fevers in several days.    07/21/20- patient is with increased O2 requirement this morning up to 10L/min.  She is actually clinically  better in appearance and lung auscultation is improved from yesterday.  She is smiling during interview.  She has PT today and may have increased O2 in preparation for therapy. Plan to continue current care plan.   07/23/20- patient is very frustrated this am due to wanting to be [facility] home. She is able to communicate via [employer] valve.  She shares there are multiple people at home that care for her and she wishes to be dcd home. I have asked her to please cooperate and be patient however she is aggitated and wishes to go home.     PHYSICAL EXAMINATION   Vital Signs: Temp:  [97.6 F (36.4 C)-99.3 F (37.4 C)] 98 F (36.7 C) (01/31 0852) Pulse Rate:  [75-93] 93 (01/31 0852) Resp:  [16-19] 17 (01/31 0852) BP: (115-138)/(78-95) 117/88 (01/31 0852) SpO2:  [94 %-99 %] 95 % (01/31 0852) FiO2 (%):  [40 %] 40 % (01/31 0736)   FiO2 (%):  [40 %] 40 %   Constitutional: anxious frail woman lying in bed  Eyes: eomi, pupils equal Ears, nose, mouth, and throat: trach in place, minmal secretions Cardiovascular: RRR,  Ext warm Respiratory: diminished with prolonged expiratory phase, occasional accessory muscle use Gastrointestinal: soft, abd wrapped after PEG, some mild tenderness at PEG insertion site MSK: muscle wasting Skin: No rashes, normal turgor, scattered bruising Neurologic: moves all 4 ext to command, weak Psychiatric: anxious, answering questions appropriately   Scheduled Meds: . acidophilus  1 capsule Oral TID  . apixaban  5 mg Per Tube BID  . arformoterol  15 mcg Nebulization BID  . chlorhexidine gluconate (MEDLINE KIT)  15 mL Mouth Rinse BID  . clonazePAM  1 mg Per Tube BID  . feeding  supplement (KATE FARMS STANDARD 1.4)  325 mL Per Tube QID  . fentaNYL  1 patch Transdermal Q72H  . fiber  1 packet Per Tube TID  . free water  120 mL Per Tube QID  . gabapentin  300 mg Per Tube TID  . glycopyrrolate  1 mg Per Tube TID  . mouth rinse  15 mL Mouth Rinse 10 times per day  .  metoprolol tartrate  50 mg Oral BID  . pantoprazole sodium  40 mg Per Tube QHS  . QUEtiapine  25 mg Per Tube QHS   Continuous Infusions:  PRN Meds:.acetaminophen (TYLENOL) oral liquid 160 mg/5 mL, diazepam, ipratropium-albuterol, labetalol, naphazoline-glycerin, ondansetron (ZOFRAN) IV    ASSESSMENT AND PLAN   Acute on chronic hypoxemic and hypercapnic respiratory failure thought secondary to polypharmacy long wean from ventilator due to muscular deconditioning now s/p tracheostomy.         -Heavy phlegm production - Glycopyrrolate tid 67m-07/21/20  Dysphagia, moderate protein calorie malnutrition- now s/p PEG  Severe baseline COPD- complicated trach wean - continue COPD carepath  Muscular deconditioning  Chronic anxiety/depression  Metabolic encephalopathy/agitation- improving  - repeat tracheal aspirate ordered -ENTEROBACTER AEROGENES - Switched from standing duonebs from duonebs to brovana/yupelri - PT/OT up to chair - Continue standing clonazepam, use valium per tube for breakthrough - SLP for PMV trials - g off IV meds for rehab vs. SNF vs. LTACH vs home?    Patient chronically critically ill due to respiratory failure, metabolic encephalopathy Interventions to address this today weaning precedex, weaning vent Risk of deterioration without these interventions is high    [facility] *This note was dictated using voice recognition software/Dragon.  Despite best efforts to proofread, errors can occur which can change the meaning.  Any change was purely unintentional.     FOttie Glazier M.D.  Pulmonary & [place]

## 2020-07-24 NOTE — Progress Notes (Signed)
Patient called out stating she was having a hard time breathing. On assessment patients oxygen saturation was 72% via trach collar. Attempt to deep suction patient and was unsuccessful. Disposible cannula removed and mucous plug present. Suctioned patient again, performed trach care, replaced a new inner cannula. Respiratory at bedside to assist. Oxygen Saturation returned to normal 100% via trach collar. Will continue to monitor,

## 2020-07-25 ENCOUNTER — Inpatient Hospital Stay: Payer: Medicare Other

## 2020-07-25 DIAGNOSIS — G9341 Metabolic encephalopathy: Secondary | ICD-10-CM | POA: Diagnosis not present

## 2020-07-25 DIAGNOSIS — J9622 Acute and chronic respiratory failure with hypercapnia: Secondary | ICD-10-CM | POA: Diagnosis not present

## 2020-07-25 DIAGNOSIS — J9621 Acute and chronic respiratory failure with hypoxia: Secondary | ICD-10-CM | POA: Diagnosis not present

## 2020-07-25 LAB — CBC
HCT: 32.5 % — ABNORMAL LOW (ref 36.0–46.0)
Hemoglobin: 9.8 g/dL — ABNORMAL LOW (ref 12.0–15.0)
MCH: 28 pg (ref 26.0–34.0)
MCHC: 30.2 g/dL (ref 30.0–36.0)
MCV: 92.9 fL (ref 80.0–100.0)
Platelets: 373 10*3/uL (ref 150–400)
RBC: 3.5 MIL/uL — ABNORMAL LOW (ref 3.87–5.11)
RDW: 15.7 % — ABNORMAL HIGH (ref 11.5–15.5)
WBC: 7.6 10*3/uL (ref 4.0–10.5)
nRBC: 0 % (ref 0.0–0.2)

## 2020-07-25 LAB — BASIC METABOLIC PANEL
Anion gap: 11 (ref 5–15)
BUN: 21 mg/dL (ref 8–23)
CO2: 35 mmol/L — ABNORMAL HIGH (ref 22–32)
Calcium: 9.2 mg/dL (ref 8.9–10.3)
Chloride: 94 mmol/L — ABNORMAL LOW (ref 98–111)
Creatinine, Ser: 0.66 mg/dL (ref 0.44–1.00)
GFR, Estimated: >60 mL/min (ref >60)
Glucose, Bld: 86 mg/dL (ref 70–99)
Potassium: 4.3 mmol/L (ref 3.5–5.1)
Sodium: 140 mmol/L (ref 135–145)

## 2020-07-25 NOTE — TOC Progression Note (Addendum)
Transition of Care [facility]) - Progression Note    Patient Details  Name: Virginia [name] MRN: [id number] Date of Birth: [birth year]  Transition of Care [facility]) CM/SW Contact  Beverly Sessions, RN Phone Number: 07/25/2020, 9:56 AM  Clinical Narrative:    Spoke with daughter in law 8.  She would like an updated on placement for patient.  Confirmed with TOC team that patient had been denied at [facility] in [place], English as a second language teacher in [place], Child psychotherapist in [place] states the reason why [facility] was denied was due to being out of network with their insurance.   Sherri request that I reach back out to [employer] with [facility] they are unable to offer a bed in [place].  They are at capacity, and have a waiting list.  Per Delsa Sale the Mudlogger at [facility] denied a bed offer.  She is to follow up with [facility] and confirm this is still the case  Fl2 updated and sent out to all local facilities  Confirmed that [facility] in [place] accepts trachs and referral faxed to [facility] [place] to review case  Updated Sherri with above information  [place] [facility] confirms that [facility] [place], and [facility] in [place] are unable to offer a bed.  Met with patient.  She is in agreement to continue to pursue SNF     Barriers to Discharge: Continued Medical Work up  Expected Discharge Plan and Services   In-house Referral: Clinical Social Work,Hospice / Palliative Care   Post Acute Care Choice: Durable Medical Equipment (Oxygen 4L) Living arrangements for the past 2 months: Apartment                                       Social Determinants of Health (SDOH) Interventions    Readmission Risk Interventions No flowsheet data found.

## 2020-07-25 NOTE — Evaluation (Signed)
Objective Swallowing Evaluation: Type of Study: MBS-Modified Barium Swallow Study   Patient Details  Name: Virginia [name] MRN: [id number] Date of Birth: [birth year]  Today's Date: 07/25/2020 Time: SLP Start Time (ACUTE ONLY): 1350 -SLP Stop Time (ACUTE ONLY): 1500  SLP Time Calculation (min) (ACUTE ONLY): 70 min   Past Medical History:  Past Medical History:  Diagnosis Date  . Anemia   . Anxiety   . Arthritis   . Atrial fibrillation (HCC)   . C. difficile colitis 09/2015  . COPD (chronic obstructive pulmonary disease) (HCC)   . Depression   . Dyspnea   . Dysrhythmia   . Fibromyalgia   . H/O tracheostomy   . Hep C w/ coma, chronic   . Hypertension   . MRSA pneumonia (HCC) 2017  . On home oxygen therapy    3 L/M   . Osteoporosis   . Peripheral neuropathy   . RLS (restless legs syndrome)   . S/P percutaneous endoscopic gastrostomy (PEG) tube placement (HCC) 09/2015  . Ventilator associated pneumonia [facility]) 10/2015   [facility], [place]   Past Surgical History:  Past Surgical History:  Procedure Laterality Date  . ABDOMINAL HYSTERECTOMY    . BREAST SURGERY Bilateral    Breast Implants  . DILATION AND CURETTAGE OF UTERUS    . PEG PLACEMENT N/A 06/21/2020   Procedure: PERCUTANEOUS ENDOSCOPIC GASTROSTOMY (PEG) PLACEMENT;  Surgeon: Regis Bill, MD;  Location: [facility] ENDOSCOPY;  Service: Endoscopy;  Laterality: N/A;  . REVERSE SHOULDER ARTHROPLASTY Right 03/06/2017   Procedure: REVERSE SHOULDER ARTHROPLASTY;  Surgeon: Christena Flake, MD;  Location: [facility] ORS;  Service: Orthopedics;  Laterality: Right;  . ROTATOR CUFF REPAIR Bilateral   . TRACHEOSTOMY TUBE PLACEMENT N/A 06/15/2020   Procedure: TRACHEOSTOMY;  Surgeon: Geanie Logan, MD;  Location: [facility] ORS;  Service: ENT;  Laterality: N/A;   HPI: Pt is a 70 yo female with severe COPD admitted with acute on chronic hypoxic hypercapnic respiratory failure and acute encephalopathy secondary to narcotic use and AECOPD  requiring mechanical intubation. Pt received both Tracheostomy and PEG placement. She is currently weaning from vent to TC. Per pt report, this is pt's Second time w/ a Tracheostomy and PEG.  She was recently hospitalized from 12/10 to 12/12 following treatment of AECOPD.  She presented [facility] to [facility] ER on 12/14 via EMS with altered mental status, cough, worsening shortness of breath, and wheezing. Pts daughter reports she has been taking narcotics along with seroquel. However, due to lack of improvement of mentation at admit, she required mechanical intubation. COVID-19/Influenza PCR and CXR negative. She was subsequently admitted to ICU for additional workup and treatment.   Subjective: pt awake, conversive verbally via use of PMV placed. Tracheostomy present. Pt on TC O2 support.    Assessment / Plan / Recommendation  CHL IP CLINICAL IMPRESSIONS 07/25/2020  Clinical Impression Pt appears to present w/ grossly adequate oropharyngeal phase swallowing function w/ no significant oral phase deficits noted; Mild pharyngeal phase dysphagia noted c/b Min delay in pharyngeal swallow initiation w/ thin liquids at this evaluation today.During this evaluation, pt was wearing her PMV(passey-muir speaking valve); pt is s/p Tracheostomy. She tolerates TC O2 support at 10L, and wearing of the PMV for short periods of time comfortably. NO overt aspiration or penetration of po trials was noted to occur during this study. During the oral phase, grossly timely bolus management for mastication and control of bolus propulsion for A-P transfer occurred w/ foods; slight premature spillage of thin liquids  into the pharynx w/ a Larger bolus size trials occurred x1. When pt took smaller size bolus sips, improved bolus control and timely/cohesive transfer of liquids into the pharynx was noted. Pt tended to masticate a bit lengthier amount of time w/ the increased textured foods but oral clearing achieved w/ all trial consistencies  given Time(pt stated she wanted to be "sure" she "chewed it well" b/f swallowing. During the pharyngeal phase, grossly timely pharyngeal swallow initiation noted w/ all trial consistencies; thin liquids tended to trigger the pharyngeal swallow at the level of the Valleculae w/ Min+ spillage from the Valleculae to the Pyriform Sinuses. No aspiration or penetration appeared to occur; airway closure appeared adequate as the swallow completed. No gross amount of pharyngeal residue remained post swallow; Min diffuse pharyngeal and BOT residue noted intermitently which pt cleared appropriately w/ a f/u, Dry swallow. Adequate laryngeal excursion and pharyngeal pressure during the swallow but this could be hampered at pt Fatigues. During the Esophageal phase, a protrusion of tissue was noted in the Cervical Esophagus below the UES -- this appeared to hamper bolus motility w/ Retrograde activity of a slight amount of bolus material from the Esophagus to the Pyriform Sinuses(all consistencies). This did not occur consistently w/ each trial given. Dry swallows b/t trials appeared to reduce and/or clear residue. This could increase risk for aspiration of retrograde material after the swallows. Pt was asked about feeling any difficulty w/ meats/breads Before this admit, and she said "yes" -- it would get "caught" sometimes pointing to below the trach.  MDs consulted re: results and pt's request to have an oral diet. Based on this MBSS, a Minced foods diet w/ thin liquids by Cup is recommended w/ strict aspiration precautions. Pt MUST wear the PMV w/ all oral intake. Meds via the PEG. Pt was instructed on use of a f/u, DRY swallow intermittently during oral intake to clear any remaining pharyngeal residue. ST services will f/u w/ ongoing education on precautions and toleration of diet. MDs agreed. NSG updated.   SLP Visit Diagnosis Dysphagia, unspecified (R13.10)  Attention and concentration deficit following --  Frontal lobe  and executive function deficit following --  Impact on safety and function Mild aspiration risk;Risk for inadequate nutrition/hydration      CHL IP TREATMENT RECOMMENDATION 07/25/2020  Treatment Recommendations Therapy as outlined in treatment plan below     Prognosis 07/25/2020  Prognosis for Safe Diet Advancement Fair  Barriers to Reach Goals Time post onset;Severity of deficits  Barriers/Prognosis Comment Esophageal phase dysmotility    CHL IP DIET RECOMMENDATION 07/25/2020  SLP Diet Recommendations Dysphagia 2 (Fine chop) solids;Thin liquid  Liquid Administration via Cup;No straw  Medication Administration Via alternative means  Compensations Minimize environmental distractions;Slow rate;Small sips/bites;Lingual sweep for clearance of pocketing;Multiple dry swallows after each bite/sip;Follow solids with liquid  Postural Changes Remain semi-upright after after feeds/meals (Comment);Seated upright at 90 degrees      CHL IP OTHER RECOMMENDATIONS 07/25/2020  Recommended Consults (No Data)  Oral Care Recommendations Oral care BID;Patient independent with oral care;Oral care before and after PO  Other Recommendations (No Data)      CHL IP FOLLOW UP RECOMMENDATIONS 07/25/2020  Follow up Recommendations (No Data)      CHL IP FREQUENCY AND DURATION 07/25/2020  Speech Therapy Frequency (ACUTE ONLY) min 2x/week  Treatment Duration 1 week           CHL IP ORAL PHASE 07/25/2020  Oral Phase WFL  Oral - Pudding Teaspoon --  Oral -  Pudding Cup --  Oral - Honey Teaspoon --  Oral - Honey Cup --  Oral - Nectar Teaspoon --  Oral - Nectar Cup NT  Oral - Nectar Straw --  Oral - Thin Teaspoon --  Oral - Thin Cup 8  Oral - Thin Straw --  Oral - Puree 3  Oral - Mech Soft --  Oral - Regular 2  Oral - Multi-Consistency --  Oral - Pill --  Oral Phase - Comment --    CHL IP PHARYNGEAL PHASE 07/25/2020  Pharyngeal Phase Impaired  Pharyngeal- Pudding Teaspoon --  Pharyngeal --  Pharyngeal- Pudding  Cup --  Pharyngeal --  Pharyngeal- Honey Teaspoon --  Pharyngeal --  Pharyngeal- Honey Cup --  Pharyngeal --  Pharyngeal- Nectar Teaspoon --  Pharyngeal --  Pharyngeal- Nectar Cup NT  Pharyngeal --  Pharyngeal- Nectar Straw --  Pharyngeal --  Pharyngeal- Thin Teaspoon --  Pharyngeal --  Pharyngeal- Thin Cup 8  Pharyngeal --  Pharyngeal- Thin Straw --  Pharyngeal --  Pharyngeal- Puree 3  Pharyngeal --  Pharyngeal- Mechanical Soft 2  Pharyngeal --  Pharyngeal- Regular --  Pharyngeal --  Pharyngeal- Multi-consistency --  Pharyngeal --  Pharyngeal- Pill --  Pharyngeal --  Pharyngeal Comment --     CHL IP CERVICAL ESOPHAGEAL PHASE 07/25/2020  Cervical Esophageal Phase Impaired  Pudding Teaspoon --  Pudding Cup --  Honey Teaspoon --  Honey Cup --  Nectar Teaspoon --  Nectar Cup NT  Nectar Straw --  Thin Teaspoon --  Thin Cup 8  Thin Straw --  Puree 3  Mechanical Soft 2  Regular --  Multi-consistency --  Pill --  Cervical Esophageal Comment --        Jerilynn Som, MS, CCC-SLP Speech Language Pathologist Rehab Services [phone] [facility] 07/25/2020, 4:12 PM

## 2020-07-25 NOTE — NC FL2 (Signed)
[place] LEVEL OF CARE SCREENING TOOL     IDENTIFICATION  Patient Name: Virginia [name] Birthdate: February 01, 1951 Sex: female Admission Date (Current Location): 06/06/2020  [place] and [place] Number:  Virginia [name] [id number] [place] and Address:  [facility], [address] [place], [place] [address]      Provider Number: [id number]  Attending Physician Name and Address:  Shawna Clamp, MD  Relative Name and Phone Number:  [name] Relative [phone]    Current Level of Care: Hospital Recommended Level of Care: [place] Prior Approval Number:    Date Approved/Denied:   PASRR Number:    Discharge Plan: SNF    Current Diagnoses: Patient Active Problem List   Diagnosis Date Noted  . Pressure injury of skin 07/14/2020  . Diarrhea   . Reactive thrombocytosis 07/12/2020  . Aspiration pneumonia of both lower lobes due to gastric secretions ([place]) 07/07/2020  . Acute urinary retention 07/06/2020  . Acute metabolic encephalopathy 81/85/6314  . Acute on chronic respiratory failure with hypoxia and hypercapnia ([place]) 07/06/2020  . COPD exacerbation ([place])   . Respiratory failure ([place]) 06/06/2020  . Acute respiratory failure with hypoxia ([place]) 06/02/2020  . COPD with acute exacerbation ([place]) 06/02/2020  . Shortness of breath 06/02/2020  . GERD (gastroesophageal reflux disease) 06/02/2020  . Iron deficiency anemia 01/22/2020  . Anemia of chronic disease 01/17/2019  . Coronary artery disease involving native coronary artery of native heart 04/30/2018  . Palpitations 04/30/2018  . Chronic heartburn 04/12/2018  . Dysphagia 04/12/2018  . Thoracic aortic aneurysm without rupture ([place]) 08/27/2017  . 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 SOBOE (shortness of breath on exertion) 01/24/2017  . AF (paroxysmal atrial fibrillation) ([place]) 11/09/2016  . Chronic hypoxemic respiratory failure ([place])  12/06/2015  . Depression with anxiety 12/06/2015  . Fibromyalgia 12/06/2015  . Hep C w/o coma, chronic ([place]) 12/06/2015  . History of tracheostomy 12/06/2015  . HTN, goal below 140/80 12/06/2015  . Localized osteoporosis without current pathological fracture 12/06/2015  . PEG (percutaneous endoscopic gastrostomy) status ([place]) 12/06/2015  . Peripheral neuropathy, idiopathic 12/06/2015  . RLS (restless legs syndrome) 12/06/2015  . Substance abuse in remission ([place]) 12/06/2015  . COPD with acute lower respiratory infection ([place]) 11/29/2015    Orientation RESPIRATION BLADDER Height & Weight     Self,Place,Situation  Tracheostomy,O2 Incontinent Weight: 54.9 kg Height:  5' 2.01" (157.5 cm)  BEHAVIORAL SYMPTOMS/MOOD NEUROLOGICAL BOWEL NUTRITION STATUS      Incontinent Feeding tube  AMBULATORY STATUS COMMUNICATION OF NEEDS Skin   Limited Assist Verbally Normal                       Personal Care Assistance Level of Assistance  Bathing,Feeding,Dressing Bathing Assistance: Limited assistance Feeding assistance: Maximum assistance Dressing Assistance: Limited assistance Total Care Assistance: Maximum assistance   Functional Limitations Info  Sight,Hearing,Speech Sight Info: Adequate Hearing Info: Adequate Speech Info: Impaired    SPECIAL CARE FACTORS FREQUENCY                       Contractures Contractures Info: Not present    Additional Factors Info                  Current Medications (07/25/2020):  This is the current hospital active medication list Current Facility-Administered Medications  Medication Dose Route Frequency Provider Last Rate Last Admin  . acetaminophen (TYLENOL) 160 MG/5ML solution 650 mg  650 mg  Per Tube Q4H PRN Rosine Door, MD   650 mg at 07/19/20 1048  . acidophilus (RISAQUAD) capsule 1 capsule  1 capsule Oral TID Renda Rolls, RPH   1 capsule at 07/24/20 2130  . apixaban (ELIQUIS) tablet 5 mg  5 mg Per Tube BID Hart Robinsons A, RPH    5 mg at 07/24/20 2130  . arformoterol (BROVANA) nebulizer solution 15 mcg  15 mcg Nebulization BID Rosine Door, MD   15 mcg at 07/25/20 1194  . chlorhexidine gluconate (MEDLINE KIT) (PERIDEX) 0.12 % solution 15 mL  15 mL Mouth Rinse BID Rosine Door, MD   15 mL at 07/24/20 2030  . clonazePAM (KLONOPIN) tablet 1 mg  1 mg Per Tube BID Rosine Door, MD   1 mg at 07/24/20 2130  . diazepam (VALIUM) tablet 5 mg  5 mg Per Tube Q6H PRN Rosine Door, MD   5 mg at 07/24/20 1520  . feeding supplement (KATE FARMS STANDARD 1.4) liquid 325 mL  325 mL Per Tube QID Max Sane, MD   325 mL at 07/24/20 2135  . fentaNYL (DURAGESIC) 50 MCG/HR 1 patch  1 patch Transdermal Q72H Rosine Door, MD   1 patch at 07/23/20 1302  . fiber (NUTRISOURCE FIBER) 1 packet  1 packet Per Tube TID Max Sane, MD   1 packet at 07/24/20 2130  . free water 120 mL  120 mL Per Tube QID Max Sane, MD   120 mL at 07/24/20 2131  . gabapentin (NEURONTIN) capsule 300 mg  300 mg Per Tube TID Rosine Door, MD   300 mg at 07/24/20 2130  . glycopyrrolate (ROBINUL) tablet 1 mg  1 mg Per Tube TID Lu Duffel, RPH   1 mg at 07/24/20 2130  . ipratropium-albuterol (DUONEB) 0.5-2.5 (3) MG/3ML nebulizer solution 3 mL  3 mL Nebulization Q6H PRN Rosine Door, MD   3 mL at 07/13/20 0611  . labetalol (NORMODYNE) injection 10 mg  10 mg Intravenous Q4H PRN Rosine Door, MD   10 mg at 06/28/20 1740  . MEDLINE mouth rinse  15 mL Mouth Rinse 10 times per day Rosine Door, MD   15 mL at 07/25/20 [phone]  . metoprolol tartrate (LOPRESSOR) tablet 50 mg  50 mg Oral BID Shawna Clamp, MD   50 mg at 07/24/20 2130  . naphazoline-glycerin (CLEAR EYES REDNESS) ophth solution 1-2 drop  1-2 drop Both Eyes QID PRN Rosine Door, MD   2 drop at 07/12/20 1040  . ondansetron (ZOFRAN) injection 4 mg  4 mg Intravenous Q6H PRN Rosine Door, MD   4 mg at 07/19/20 1511  . pantoprazole sodium (PROTONIX) 40 mg/20 mL oral suspension 40 mg  40 mg Per Tube  QHS Max Sane, MD   40 mg at 07/24/20 2130  . QUEtiapine (SEROQUEL) tablet 25 mg  25 mg Per Tube QHS Tyler Pita, MD   25 mg at 07/24/20 2130     Discharge Medications: Please see discharge summary for a list of discharge medications.  Relevant Imaging Results:  Relevant Lab Results:   Additional Information SS# [id number]  Beverly Sessions, RN

## 2020-07-25 NOTE — Progress Notes (Signed)
PROGRESS NOTE    Virginia [name]  MVH:[id number] DOB: [birth year] DOA: 06/06/2020 PCP: Idelle Crouch, MD    Brief Narrative: This 70 years old female with severe COPD admitted with acute on chronic hypoxic and hypercapnic respiratory failure and acute encephalopathy secondary to narcotic use and advanced COPD requiring mechanical ventilation.  She was treated with steroids and IV antibiotics.  She could not be liberated from the ventilator so tracheostomy was performed and she has been receiving oxygen via trach collar. Patient developed worsening hypoxia on 1/15.  Chest x-ray shows bilateral lower lobe infiltrate consistent with aspiration pneumonia. She has completed antibiotics.  On 1/20 patient had a sudden onset of altered mental status,  ABG shows severe metabolic acidosis. Patient was transferred to ICU. PCCM pickup 1/24: Acute encephalopathy resolved.  CT head unremarkable for any acute stroke or bleeding.  Mental status has normalized without any focal deficit. COPD appears stable.  1/26 -> transfer to med-surg. Waiting for LTAC decision. 1/27-> LTAC declined.  Waiting for SNF placement. 1/28 -> oxygen requirement up to 10 L after PT/OT session.   1/29: > RRT called, O2 saturation dropped to 70s.  Found to have mucous plugging in the trach which was removed,  O2 saturation improved. 1/30- > Patient appears improved.  She is lying comfortably.  Oxygen requirement via trach 10 L. 1/31-> Given severe baseline COPD, would be difficult to wean trach.    2/1- > Difficult placement in LTAC.  TOC working to find place. [facility] in [place] accepts trachs. Awaiting response.  Assessment & Plan:   Active Problems:   AF (paroxysmal atrial fibrillation) (HCC)   Anemia of chronic disease   COPD with acute lower respiratory infection ([place])   Dysphagia   Respiratory failure (HCC)   COPD exacerbation (HCC)   Acute urinary retention   Acute metabolic encephalopathy   Acute  on chronic respiratory failure with hypoxia and hypercapnia (HCC)   Aspiration pneumonia of both lower lobes due to gastric secretions (HCC)   Reactive thrombocytosis   Pressure injury of skin   Diarrhea   Acute on chronic hypoxic and hypercapnic respiratory failure: COPD exacerbation: Stable. Aspiration pneumonia :  Completed IV zosyn on 1/25. She is on trach collar, O2 sats 97% on 10 L, wean as able. She had a transient episode of moderate respiratory distress earlier due to blocked mucous/obstructed trach.  Now resolved Tracheal aspirate growing Enterobacter aerogenes Continue Glycopyrrolate for secretions. She is satting 97% on trach @ 8L.  AcuteMetabolic Encephalopathy:  >>>Resolved. Patient had a sudden onset of slurred speech and altered mental status. Differential including mucous plug, aspiration pneumoniaversus acute intracranial hemorrhage. Stat ABG showed pH of 7.0. Patient was not responding, code stroke called. CT scan ruled out intracranial hemorrhage. Patient does not have hypoglycemia. Patient was transferred to ICU. She is back to her baseline mental status. Stopping Narcotics one at a time (stopped norco on 1/26)   Severe metabolic acidosis>> resolved. Etiology still unclear, it appears to be sudden onset.  Diarrhea: >>> Nursing reports 2 large loose bowel movement on 1/28. Patient has been on antibiotics but no leukocytosis or abdominal pain,   ID recommended no need for C. difficile testing. Could be tube feed related.  Could consider Imodium as needed  Dysphagia. Deconditioning Moderate protein calorie malnutrition. On TFs and tolerating  Anxiety :  Continue Klonopin and valium for breakthrough.  Agitation  >> Improved. Remained on Precedex drip for agitation. She is off precedex,  Continue Klonopin and Valium.  Paroxysmal A. Fib:  Continue Eliquis,  EKG: normal sinus rhythm. Heart rate now controlled. Metoprolol 50 mg twice daily  resumed.    DVT prophylaxis:  Lovenox Code Status: Full code. Family Communication: Spoke with daughter Virginia [name]. Disposition Plan:  Status is: Inpatient  Remains inpatient appropriate because:Inpatient level of care appropriate due to severity of illness   Dispo: The patient is from: Home              Anticipated d/c is to: SNF              Anticipated d/c date is: > 3 days              Patient currently is not medically stable to d/c. TOC team working on placement/SNF versus CIR   Difficult to place patient Yes   Consultants:   PCCM.  Procedures: Tracheostomy and PEG tube.  Antimicrobials:   Anti-infectives (From admission, onward)   Start     Dose/Rate Route Frequency Ordered Stop   07/14/20 1100  piperacillin-tazobactam (ZOSYN) IVPB 3.375 g        3.375 g 12.5 mL/hr over 240 Minutes Intravenous Every 8 hours 07/14/20 0946 07/18/20 2359   07/08/20 1145  amoxicillin-clavulanate (AUGMENTIN) 875-125 MG per tablet 1 tablet  Status:  Discontinued        1 tablet Per Tube Every 12 hours 07/08/20 1055 07/11/20 0803   07/06/20 1530  piperacillin-tazobactam (ZOSYN) IVPB 3.375 g  Status:  Discontinued        3.375 g 12.5 mL/hr over 240 Minutes Intravenous Every 8 hours 07/06/20 1433 07/08/20 1055   06/21/20 1200  ceFAZolin (ANCEF) IVPB 2g/100 mL premix       Note to Pharmacy: Please have at bedside. Will be administered one hour prior to procedure. Endo staff will instruct on when to start infusion.   2 g 200 mL/hr over 30 Minutes Intravenous  Once 06/21/20 0820 06/21/20 2030   06/07/20 1400  azithromycin (ZITHROMAX) 500 mg in sodium chloride 0.9 % 250 mL IVPB  Status:  Discontinued        500 mg 250 mL/hr over 60 Minutes Intravenous Every 24 hours 06/06/20 2030 06/06/20 2044   06/07/20 1400  azithromycin (ZITHROMAX) 500 mg in sodium chloride 0.9 % 250 mL IVPB  Status:  Discontinued        500 mg 250 mL/hr over 60 Minutes Intravenous Every 24 hours 06/06/20 2044 06/08/20 1031    06/06/20 1415  cefTRIAXone (ROCEPHIN) 2 g in sodium chloride 0.9 % 100 mL IVPB        2 g 200 mL/hr over 30 Minutes Intravenous  Once 06/06/20 1407 06/06/20 1514   06/06/20 1415  azithromycin (ZITHROMAX) 500 mg in sodium chloride 0.9 % 250 mL IVPB        500 mg 250 mL/hr over 60 Minutes Intravenous  Once 06/06/20 1407 06/06/20 1708      Subjective: Patient was seen and examined at bedside.  Overnight events noted.  Patient seems improved and comfortable.   She used [employer] valve to communicate. She wants to go home, explained in detail, it would be difficult. She needs tracheostomy and PEG care.   Objective: Vitals:   07/25/20 0400 07/25/20 0550 07/25/20 0728 07/25/20 0842  BP:  114/72  115/80  Pulse:  77  99  Resp:  18  20  Temp:  98.8 F (37.1 C)  98.6 F (37 C)  TempSrc:  Oral  Oral  SpO2: 96% 98% 96% 95%  Weight:      Height:        Intake/Output Summary (Last 24 hours) at 07/25/2020 1323 Last data filed at 07/24/2020 2023 Gross per 24 hour  Intake 0 ml  Output 0 ml  Net 0 ml   Filed Weights   07/16/20 0500 07/17/20 0426 07/18/20 0500  Weight: 56.4 kg 55.4 kg 54.9 kg    Examination:  General exam: Appears calm and comfortable , trach collar noted Respiratory system: Clear to auscultation. Respiratory effort normal. Cardiovascular system: S1 & S2 heard, RRR. No JVD, murmurs, rubs, gallops or clicks. No pedal edema. Gastrointestinal system: Abdomen is nondistended, soft and nontender. No organomegaly or masses felt. Normal bowel sounds heard.   PEG tube in place. Central nervous system: Alert and oriented. No focal neurological deficits. Extremities: Symmetric 5 x 5 power. Skin: No rashes, lesions or ulcers Psychiatry: Judgement and insight appear normal. Mood & affect appropriate.     Data Reviewed: I have personally reviewed following labs and imaging studies  CBC: Recent Labs  Lab 07/19/20 0430 07/23/20 0419 07/24/20 0528 07/25/20 0341  WBC 9.1  8.0 6.8 7.6  HGB 8.4* 9.9* 10.0* 9.8*  HCT 28.7* 32.7* 32.2* 32.5*  MCV 98.3 92.9 92.0 92.9  PLT 308 359 369 583   Basic Metabolic Panel: Recent Labs  Lab 07/19/20 0430 07/23/20 0419 07/24/20 0528 07/25/20 0341  NA 144 138 141 140  K 4.3 4.1 4.0 4.3  CL 97* 95* 97* 94*  CO2 41* 36* 35* 35*  GLUCOSE 89 91 81 86  BUN _0 CREATININE 0.46 0.54 0.61 0.66  CALCIUM 8.8* 9.4 9.5 9.2  MG 2.1 2.0 2.0  --   PHOS 2.9 4.4 5.2*  --    GFR: Estimated Creatinine Clearance: 52.5 mL/min (by C-G formula based on SCr of 0.66 mg/dL). Liver Function Tests: No results for input(s): AST, ALT, ALKPHOS, BILITOT, PROT, ALBUMIN in the last 168 hours. No results for input(s): LIPASE, AMYLASE in the last 168 hours. No results for input(s): AMMONIA in the last 168 hours. Coagulation Profile: No results for input(s): INR, PROTIME in the last 168 hours. Cardiac Enzymes: No results for input(s): CKTOTAL, CKMB, CKMBINDEX, TROPONINI in the last 168 hours. BNP (last 3 results) No results for input(s): PROBNP in the last 8760 hours. HbA1C: No results for input(s): HGBA1C in the last 72 hours. CBG: Recent Labs  Lab 07/21/20 0412 07/21/20 0828 07/21/20 1130 07/21/20 1611 07/23/20 2112  GLUCAP 90 80 92 88 110*   Lipid Profile: No results for input(s): CHOL, HDL, LDLCALC, TRIG, CHOLHDL, LDLDIRECT in the last 72 hours. Thyroid Function Tests: No results for input(s): TSH, T4TOTAL, FREET4, T3FREE, THYROIDAB in the last 72 hours. Anemia Panel: No results for input(s): VITAMINB12, FOLATE, FERRITIN, TIBC, IRON, RETICCTPCT in the last 72 hours. Sepsis Labs: No results for input(s): PROCALCITON, LATICACIDVEN in the last 168 hours.  No results found for this or any previous visit (from the past 240 hour(s)).  Radiology Studies: No results found.  Scheduled Meds: . acidophilus  1 capsule Oral TID  . apixaban  5 mg Per Tube BID  . arformoterol  15 mcg Nebulization BID  . chlorhexidine gluconate  (MEDLINE KIT)  15 mL Mouth Rinse BID  . clonazePAM  1 mg Per Tube BID  . feeding supplement ([employer] STANDARD 1.4)  325 mL Per Tube QID  . fentaNYL  1 patch Transdermal Q72H  . fiber  1  packet Per Tube TID  . free water  120 mL Per Tube QID  . gabapentin  300 mg Per Tube TID  . glycopyrrolate  1 mg Per Tube TID  . mouth rinse  15 mL Mouth Rinse 10 times per day  . metoprolol tartrate  50 mg Oral BID  . pantoprazole sodium  40 mg Per Tube QHS  . QUEtiapine  25 mg Per Tube QHS   Continuous Infusions:   LOS: 49 days    Time spent: 25 mins.    Virginia Clamp, MD [facility]   If 7PM-7AM, please contact night-coverage

## 2020-07-26 ENCOUNTER — Other Ambulatory Visit: Payer: Self-pay

## 2020-07-26 DIAGNOSIS — J9622 Acute and chronic respiratory failure with hypercapnia: Secondary | ICD-10-CM | POA: Diagnosis not present

## 2020-07-26 DIAGNOSIS — G9341 Metabolic encephalopathy: Secondary | ICD-10-CM | POA: Diagnosis not present

## 2020-07-26 DIAGNOSIS — J9621 Acute and chronic respiratory failure with hypoxia: Secondary | ICD-10-CM | POA: Diagnosis not present

## 2020-07-26 LAB — CBC
HCT: 31.5 % — ABNORMAL LOW (ref 36.0–46.0)
Hemoglobin: 9.5 g/dL — ABNORMAL LOW (ref 12.0–15.0)
MCH: 28.4 pg (ref 26.0–34.0)
MCHC: 30.2 g/dL (ref 30.0–36.0)
MCV: 94.3 fL (ref 80.0–100.0)
Platelets: 344 10*3/uL (ref 150–400)
RBC: 3.34 MIL/uL — ABNORMAL LOW (ref 3.87–5.11)
RDW: 15.7 % — ABNORMAL HIGH (ref 11.5–15.5)
WBC: 8.2 10*3/uL (ref 4.0–10.5)
nRBC: 0 % (ref 0.0–0.2)

## 2020-07-26 LAB — BASIC METABOLIC PANEL
Anion gap: 8 (ref 5–15)
BUN: 19 mg/dL (ref 8–23)
CO2: 36 mmol/L — ABNORMAL HIGH (ref 22–32)
Calcium: 8.9 mg/dL (ref 8.9–10.3)
Chloride: 96 mmol/L — ABNORMAL LOW (ref 98–111)
Creatinine, Ser: 0.57 mg/dL (ref 0.44–1.00)
GFR, Estimated: >60 mL/min (ref >60)
Glucose, Bld: 87 mg/dL (ref 70–99)
Potassium: 4.6 mmol/L (ref 3.5–5.1)
Sodium: 140 mmol/L (ref 135–145)

## 2020-07-26 LAB — PHOSPHORUS: Phosphorus: 4.2 mg/dL (ref 2.5–4.6)

## 2020-07-26 LAB — MAGNESIUM: Magnesium: 2 mg/dL (ref 1.7–2.4)

## 2020-07-26 MED ORDER — NYSTATIN 100000 UNIT/GM EX POWD
Freq: Three times a day (TID) | CUTANEOUS | Status: DC
  Filled 2020-07-26: qty 15

## 2020-07-26 NOTE — Progress Notes (Signed)
Physical Therapy Treatment Patient Details Name: Virginia [name] MRN: [id number] DOB: 08-27-1950 Today's Date: 07/26/2020    History of Present Illness 70 yo female with severe COPD admitted with acute on chronic hypoxic hypercapnic respiratory failure and acute encephalopathy secondary to narcotic use and AECOPD requiring mechanical intubation. Initially intubated from 12/14-12/16 and then again from 12/18-12/23. Now with + trach collar. Recent hospitalization from 12/10-12/12/21. RR secondary to slurred speech and AMS. Transferred to CCU on 1/20 on full vent support. RE-evaluation performed this date as medical status has improved.    PT Comments    Pt was long sitting in bed upon arriving with trach collar donned and on 10 L o2. She agrees to PT session and is cooperative throughout. " The MD wants me to walk everyday." She is very motivated to improve to go home once down to safe oxygen level for home use. Was able to get OOB without physical assistance. Removed passymuir valve throughout gait per pt request. Stood and ambulated 70 ft on 10 L without difficulty however once fatigued, pt gets anxious and has elevation RR. Elevated to 44 bpm at highest. Sao2 >90% throughout. At conclusion of session, pt was sitting in recliner with call bell in reach and chair alarm in place. Acute PT will continue to follow and progress as able per POC.    Follow Up Recommendations  CIR;Supervision/Assistance - 24 hour;Supervision for mobility/OOB     Equipment Recommendations  None recommended by PT    Recommendations for Other Services       Precautions / Restrictions Precautions Precautions: Fall Precaution Comments: trach Restrictions Weight Bearing Restrictions: No    Mobility  Bed Mobility Overal bed mobility: Needs Assistance Bed Mobility: Supine to Sit Rolling: Supervision   Supine to sit: Supervision;HOB elevated        Transfers Overall transfer level: Needs  assistance Equipment used: Rolling walker (2 wheeled) Transfers: Sit to/from Stand Sit to Stand: Min guard         General transfer comment: CGA for safety. pt stood from standard height chair without armrest  Ambulation/Gait Ambulation/Gait assistance: Min guard Gait Distance (Feet): 70 Feet Assistive device: Rolling walker (2 wheeled) Gait Pattern/deviations: Narrow base of support Gait velocity: decreased   General Gait Details: pt ambulated ~ 70 ft total on 10 L trach collar. sao2 >90% but RR elevated to 40s. anxiety most limiting       Balance Overall balance assessment: Needs assistance Sitting-balance support: Feet supported Sitting balance-Leahy Scale: Good Sitting balance - Comments: no balance deficits sitting EOB   Standing balance support: Bilateral upper extremity supported;During functional activity Standing balance-Leahy Scale: Fair Standing balance comment: requires BUE support on RW for gait       Cognition Arousal/Alertness: Awake/alert Behavior During Therapy: WFL for tasks assessed/performed;Anxious (gets anxious with ambulation. RR elevation however sao2 stable) Overall Cognitive Status: Within Functional Limits for tasks assessed      General Comments: Pt is A and oriented however pt does get anxious with fatigue             Pertinent Vitals/Pain Pain Assessment: No/denies pain           PT Goals (current goals can now be found in the care plan section) Acute Rehab PT Goals Patient Stated Goal: To return home safely Progress towards PT goals: Progressing toward goals    Frequency    Min 2X/week      PT Plan Current plan remains appropriate    Co-evaluation  PT goals addressed during session: Mobility/safety with mobility;Balance;Proper use of DME;Strengthening/ROM        AM-PAC PT "6 Clicks" Mobility   Outcome Measure  Help needed turning from your back to your side while in a flat bed without using bedrails?:  None Help needed moving from lying on your back to sitting on the side of a flat bed without using bedrails?: A Little Help needed moving to and from a bed to a chair (including a wheelchair)?: A Little Help needed standing up from a chair using your arms (e.g., wheelchair or bedside chair)?: A Little Help needed to walk in hospital room?: A Lot Help needed climbing 3-5 steps with a railing? : A Lot 6 Click Score: 17    End of Session Equipment Utilized During Treatment: Gait belt;Oxygen (10 L trach collar) Activity Tolerance: Patient limited by fatigue;Patient tolerated treatment well Patient left: in bed;with bed alarm set;with call bell/phone within reach Nurse Communication: Mobility status PT Visit Diagnosis: Unsteadiness on feet (R26.81);Muscle weakness (generalized) (M62.81);Difficulty in walking, not elsewhere classified (R26.2)     Time: 1340-1400 PT Time Calculation (min) (ACUTE ONLY): 20 min  Charges:  $Gait Training: 8-22 mins                     Jetta Lout PTA 07/26/20, 2:33 PM

## 2020-07-26 NOTE — Progress Notes (Signed)
Occupational Therapy Treatment Patient Details Name: Virginia [name] MRN: [id number] DOB: 04-21-1951 Today's Date: 07/26/2020    History of present illness 70 yo female with severe COPD admitted with acute on chronic hypoxic hypercapnic respiratory failure and acute encephalopathy secondary to narcotic use and AECOPD requiring mechanical intubation. Initially intubated from 12/14-12/16 and then again from 12/18-12/23. Now with + trach collar. Recent hospitalization from 12/10-12/12/21. RR secondary to slurred speech and AMS. Transferred to CCU on 1/20 on full vent support. RE-evaluation performed this date as medical status has improved.   OT comments  Pt seen for OT treatment this date to f/u re: safety and tolerance for ADLs/ADL mobility. Pt is somewhat self-limiting, but agreeable to session. OT engages pt in dressing/toileting tasks at bed level as pt declines OTs attempts to encourage OOB Activity and also is already on bed pan when OT presents (despite ongoing education about importance of OOB activity, even just for toileting needs). Pt with good understanding, requires CGA for bedpan removal, SUPV for rolling, and SETUP for bed level peri care. Pt with some increased WOB requiring recovery time with rolling and propelling towards HOB to increase comfort/optimize positioning at end of session. Will continue to follow acutely and updated pt's frequency to 2x/wk to optimize potential to gain highest attainable level of INDEP with self care ADLs. Pt left with all needs met and in reach.   Follow Up Recommendations  CIR;SNF    Equipment Recommendations  Other (comment) (defer to next level)    Recommendations for Other Services      Precautions / Restrictions Precautions Precautions: Fall Precaution Comments: trach Restrictions Weight Bearing Restrictions: No Other Position/Activity Restrictions: HR/ RR       Mobility Bed Mobility Overal bed mobility: Needs Assistance Bed  Mobility: Rolling Rolling: Supervision   Supine to sit: Supervision;HOB elevated        Transfers Overall transfer level: Needs assistance Equipment used: Rolling walker (2 wheeled) Transfers: Sit to/from Stand Sit to Stand: Min guard         General transfer comment: CGA for safety. pt stood from standard height chair without armrest    Balance Overall balance assessment: Needs assistance Sitting-balance support: Feet supported Sitting balance-Leahy Scale: Good Sitting balance - Comments: no balance deficits sitting EOB   Standing balance support: Bilateral upper extremity supported;During functional activity Standing balance-Leahy Scale: Fair Standing balance comment: requires BUE support on RW for gait                           ADL either performed or assessed with clinical judgement   ADL Overall ADL's : Needs assistance/impaired                     Lower Body Dressing: Minimal assistance;Bed level Lower Body Dressing Details (indicate cue type and reason): lateral rolling technique   Toilet Transfer Details (indicate cue type and reason): NA, pt on bed pan when OT presents. Able to use rolling technique with CGA/use of bed rails to remove. Pt again educated re: importance of OOB activity, even just transferring onto [facility] versus using bed pan although it's easier. Pt with good understanding, although somewhat reluctant. Toileting- Clothing Manipulation and Hygiene: Set up;Bed level Toileting - Clothing Manipulation Details (indicate cue type and reason): using lateral roll             Vision Patient Visual Report: No change from baseline     Perception  Praxis      Cognition Arousal/Alertness: Awake/alert Behavior During Therapy: WFL for tasks assessed/performed;Anxious Overall Cognitive Status: Within Functional Limits for tasks assessed                                 General Comments: Pt is A and oriented however  pt does get anxious with fatigue        Exercises Other Exercises Other Exercises: OT engages pt in dressing/toileting tasks at bed level as pt declines OTs attempts to encourage OOB Activity and also is already on bed pan when OT presents (despite ongoing education about importance of OOB activity, even just for toileting needs). Pt with good understanding, requires CGA for bedpan removal, SUPV for rolling, and SETUP for bed level peri care. Pt with some increased WOB requiring recovery time with rolling and propelling towards HOB to increase comfort/optimize positioning at end of session.   Shoulder Instructions       General Comments      Pertinent Vitals/ Pain       Pain Assessment: No/denies pain  Home Living                                          Prior Functioning/Environment              Frequency  Min 1X/week        Progress Toward Goals  OT Goals(current goals can now be found in the care plan section)  Progress towards OT goals: Progressing toward goals  Acute Rehab OT Goals Patient Stated Goal: To return home safely OT Goal Formulation: With patient Time For Goal Achievement: 07/31/20 Potential to Achieve Goals: Good  Plan Frequency needs to be updated;Discharge plan remains appropriate    Co-evaluation        PT goals addressed during session: Mobility/safety with mobility;Balance;Proper use of DME;Strengthening/ROM        AM-PAC OT "6 Clicks" Daily Activity     Outcome Measure   Help from another person eating meals?: A Little Help from another person taking care of personal grooming?: A Little Help from another person toileting, which includes using toliet, bedpan, or urinal?: A Little Help from another person bathing (including washing, rinsing, drying)?: A Little Help from another person to put on and taking off regular upper body clothing?: A Little Help from another person to put on and taking off regular lower body  clothing?: A Little 6 Click Score: 18    End of Session Equipment Utilized During Treatment: Oxygen (trach collar 8L)  OT Visit Diagnosis: Other abnormalities of gait and mobility (R26.89);Muscle weakness (generalized) (M62.81)   Activity Tolerance Patient tolerated treatment well   Patient Left in bed;with call bell/phone within reach;with bed alarm set   Nurse Communication Mobility status        Time: 1630-1710 OT Time Calculation (min): 40 min  Charges: OT General Charges $OT Visit: 1 Visit OT Treatments $Self Care/Home Management : 38-52 mins  Gerrianne Scale, [place], OTR/L ascom [phone] 07/26/20, 6:15 PM

## 2020-07-26 NOTE — Progress Notes (Signed)
Patient is red in groin folds. Order received from Dr Lucianne Muss for nystatin

## 2020-07-26 NOTE — Progress Notes (Signed)
PULMONARY PROGRESS NOTE    Name: Virginia [name] MRN: [id number] DOB: 1951/04/26     LOS: 21   SUBJECTIVE FINDINGS & SIGNIFICANT EVENTS    Patient description:  70 yo female with severe COPD admitted with acute on chronic hypoxic hypercapnic respiratory failure and acute encephalopathy secondary to narcotic use and AECOPD requiring mechanical intubation  Now s/p trach/PEG working on trach weaning.  On TC at present, anxious. Remains on precedex.  06/27/20- patient is improved.  Signed out to [facility] - Dr Jimmye Norman for pick up in am.   06/29/20- patient is with respiratory distress, she has high volume phlegm from trache, ive suctioned out whats possible via [place] and messaged RT to assist.  RN was able to come in and evaluate patient and she was able to communicate appropriately.   07/07/20- patient is in bed resting. She is clinically better today then yesterday. Resting in bed in no distress.  07/08/20- Patient is lucid and awake.  Son is at bedside we discussed care plan.  CXR in am.   1/16-17/22- patient resting in bed comfortbably no acute events overnight.   07/11/20- patient had episode of mild respiratory distress due to voluminous phlegm expectorated via trache. This was suctioned by RN with recovery. Patient is clear to auscultation with occasional low pitch wheezes.  She is being optimized for d/c.   07/20/20- patient had episode of mild-moderate respiratory distress due to obstructed trache with inspissated phlegm.  She is on 7-8L/min humidifed HFnc.  She is with ENTEROBACTER AEROGENES on trache aspirate culture. ID is following and there is abscence of leucosytosis or fevers in several days.    07/21/20- patient is with increased O2 requirement this morning up to 10L/min.  She is actually clinically  better in appearance and lung auscultation is improved from yesterday.  She is smiling during interview.  She has PT today and may have increased O2 in preparation for therapy. Plan to continue current care plan.   07/23/20- patient is very frustrated this am due to wanting to be [facility] home. She is able to communicate via [employer] valve.  She shares there are multiple people at home that care for her and she wishes to be dcd home. I have asked her to please cooperate and be patient however she is aggitated and wishes to go home.    07/26/2020- patient is sitting up in chair, she is eating lunch without regurgitation.  Daughter is in room during evaluation.  Patient is vocalizing well with PMV.  She is requesting reversal of PEG tube and dc home. She remains on 8L/min [place].  She should have PT/OT/CPT   PHYSICAL EXAMINATION   Vital Signs: Temp:  [98.2 F (36.8 C)-99.2 F (37.3 C)] 98.6 F (37 C) (02/02 1138) Pulse Rate:  [72-90] 81 (02/02 1138) Resp:  [16-22] 18 (02/02 1138) BP: (114-137)/(74-87) 124/87 (02/02 1138) SpO2:  [98 %-100 %] 99 % (02/02 1138) FiO2 (%):  [40 %] 40 % (02/02 0734)   FiO2 (%):  [40 %] 40 %   Constitutional: anxious frail woman lying in bed  Eyes: eomi, pupils equal Ears, nose, mouth, and throat: trach in place, minmal secretions Cardiovascular: RRR,  Ext warm Respiratory: diminished with prolonged expiratory phase, occasional accessory muscle use Gastrointestinal: soft, abd wrapped after PEG, some mild tenderness at PEG insertion site MSK: muscle wasting Skin: No rashes, normal turgor, scattered bruising Neurologic: moves all 4 ext to command, weak Psychiatric: anxious, answering questions appropriately   Scheduled  Meds: . acidophilus  1 capsule Oral TID  . apixaban  5 mg Per Tube BID  . arformoterol  15 mcg Nebulization BID  . chlorhexidine gluconate (MEDLINE KIT)  15 mL Mouth Rinse BID  . clonazePAM  1 mg Per Tube BID  . feeding supplement (KATE FARMS  STANDARD 1.4)  325 mL Per Tube QID  . fentaNYL  1 patch Transdermal Q72H  . fiber  1 packet Per Tube TID  . free water  120 mL Per Tube QID  . gabapentin  300 mg Per Tube TID  . glycopyrrolate  1 mg Per Tube TID  . mouth rinse  15 mL Mouth Rinse 10 times per day  . metoprolol tartrate  50 mg Oral BID  . pantoprazole sodium  40 mg Per Tube QHS  . QUEtiapine  25 mg Per Tube QHS   Continuous Infusions:  PRN Meds:.acetaminophen (TYLENOL) oral liquid 160 mg/5 mL, diazepam, ipratropium-albuterol, labetalol, naphazoline-glycerin, ondansetron (ZOFRAN) IV    ASSESSMENT AND PLAN   Acute on chronic hypoxemic and hypercapnic respiratory failure thought secondary to polypharmacy long wean from ventilator due to muscular deconditioning now s/p tracheostomy.         -Heavy phlegm production - Glycopyrrolate tid 59m-07/21/20  Dysphagia, moderate protein calorie malnutrition- now s/p PEG  Severe baseline COPD- complicated trach wean - continue COPD carepath  Muscular deconditioning-PT/OT/CPT  Chronic anxiety/depression  Metabolic encephalopathy/agitation- improving  - repeat tracheal aspirate ordered -ENTEROBACTER AEROGENES - Switched from standing duonebs from duonebs to brovana/yupelri - PT/OT up to chair - Continue standing clonazepam, use valium per tube for breakthrough - SLP for PMV trials - g off IV meds for rehab vs. SNF vs. LTACH vs home?    Patient chronically critically ill due to respiratory failure, metabolic encephalopathy Interventions to address this today weaning precedex, weaning vent Risk of deterioration without these interventions is high    [facility] *This note was dictated using voice recognition software/Dragon.  Despite best efforts to proofread, errors can occur which can change the meaning.  Any change was purely unintentional.     FOttie Glazier M.D.  [facility]ulmonary & [facility] [place]

## 2020-07-26 NOTE — TOC Progression Note (Signed)
Transition of Care [facility]) - Progression Note    Patient Details  Name: Virginia [name] MRN: [id number] Date of Birth: Sep 15, 1950  Transition of Care [facility]) CM/SW Contact  Chapman Fitch, RN Phone Number: 07/26/2020, 9:54 AM  Clinical Narrative:     No current bed offers [facility] does not accept patient's that have not been vaccinated. I have inquired if the would be able to consider if patient receives the [place] and [place] vaccine while in the hospital.  Awaiting response  Confirmed with Lurena Joiner at [facility] they are unable to accept the patient as it went to 3rd level of review and was denied  Message Left for [facility] to determine if they have any facilities that accept trachs    Barriers to Discharge: Continued Medical Work up  Expected Discharge Plan and Services   In-house Referral: Clinical Social Work,Hospice / Palliative Care   Post Acute Care Choice: Durable Medical Equipment (Oxygen 4L) Living arrangements for the past 2 months: Apartment                                       Social Determinants of Health (SDOH) Interventions    Readmission Risk Interventions No flowsheet data found.

## 2020-07-26 NOTE — Progress Notes (Signed)
PROGRESS NOTE    Virginia [name]  JQG:[id number] DOB: July 30, 1950 DOA: 06/06/2020 PCP: Idelle Crouch, MD    Brief Narrative: This 70 years old female with severe COPD admitted with acute on chronic hypoxic and hypercapnic respiratory failure and acute encephalopathy secondary to narcotic use and advanced COPD requiring mechanical ventilation.  She was treated with steroids and IV antibiotics.  She could not be liberated from the ventilator so tracheostomy was performed and she has been receiving oxygen via trach collar. Patient developed worsening hypoxia on 1/15.  Chest x-ray shows bilateral lower lobe infiltrate consistent with aspiration pneumonia. She has completed antibiotics.  On 1/20 patient had a sudden onset of altered mental status,  ABG shows severe metabolic acidosis. Patient was transferred to ICU. PCCM pickup 1/24: Acute encephalopathy resolved.  CT head unremarkable for any acute stroke or bleeding.  Mental status has normalized without any focal deficit. COPD appears stable.  1/26 -> transfer to med-surg. Waiting for LTAC decision. 1/27-> LTAC declined.  Waiting for SNF placement. 1/28 -> oxygen requirement up to 10 L after PT/OT session.   1/29: > RRT called, O2 saturation dropped to 70s.  Found to have mucous plugging in the trach which was removed,  O2 saturation improved. 1/30- > Patient appears improved.  She is lying comfortably.  Oxygen requirement via trach 10 L. 1/31-> Given severe baseline COPD, would be difficult to wean trach.    2/1- > Difficult placement in LTAC.  TOC working to find place. [facility] in [place] accepts trachs. Awaiting response.  2/2: > Patient passed MBS,  started on dysphagia 2 diet.  She is tolerating well.  Patient is requesting to remove the PEG tube before discharge.  Assessment & Plan:   Active Problems:   AF (paroxysmal atrial fibrillation) (HCC)   Anemia of chronic disease   COPD with acute lower respiratory  infection ([place])   Dysphagia   Respiratory failure (HCC)   COPD exacerbation (HCC)   Acute urinary retention   Acute metabolic encephalopathy   Acute on chronic respiratory failure with hypoxia and hypercapnia (HCC)   Aspiration pneumonia of both lower lobes due to gastric secretions (HCC)   Reactive thrombocytosis   Pressure injury of skin   Diarrhea   Acute on chronic hypoxic and hypercapnic respiratory failure: COPD exacerbation: Stable. Aspiration pneumonia :  Completed IV zosyn on 1/25. She is on trach collar, O2 sats 97% on 10 L, wean as able. She had a transient episode of moderate respiratory distress earlier due to blocked mucous/obstructed trach.  Now resolved Tracheal aspirate growing Enterobacter aerogenes Continue Glycopyrrolate for secretions. She is satting 97% on trach @ 8L.  AcuteMetabolic Encephalopathy:  >>>Resolved. Patient had a sudden onset of slurred speech and altered mental status. Differential including mucous plug, aspiration pneumoniaversus acute intracranial hemorrhage. Stat ABG showed pH of 7.0. Patient was not responding, code stroke called. CT scan ruled out intracranial hemorrhage. Patient does not have hypoglycemia. Patient was transferred to ICU. She is back to her baseline mental status. Stopping Narcotics one at a time (stopped norco on 1/26)   Severe metabolic acidosis>> resolved. Etiology still unclear, it appears to be sudden onset.  Diarrhea: >>> Nursing reports 2 large loose bowel movement on 1/28. Patient has been on antibiotics but no leukocytosis or abdominal pain,   ID recommended no need for C. difficile testing. Could be tube feed related.  Could consider Imodium as needed  Dysphagia. Deconditioning Moderate protein calorie malnutrition. On TFs  and tolerating. Patient has passed modified barium swallow. Started on dysphagia 2 diet, tolerating well. Patient is requesting to remove the PEG tube before  discharge.  Anxiety :  Continue Klonopin and valium for breakthrough.  Agitation  >> Improved. Remained on Precedex drip for agitation. She is off precedex,   Continue Klonopin and Valium.  Paroxysmal A. Fib:  Continue Eliquis,  EKG: normal sinus rhythm. Heart rate now controlled. Metoprolol 50 mg twice daily resumed.    DVT prophylaxis:  Lovenox Code Status: Full code. Family Communication: Spoke with daughter sherry. Disposition Plan:  Status is: Inpatient  Remains inpatient appropriate because:Inpatient level of care appropriate due to severity of illness   Dispo: The patient is from: Home              Anticipated d/c is to: SNF              Anticipated d/c date is: > 3 days              Patient currently is not medically stable to d/c. TOC team working on placement/SNF versus CIR   Difficult to place patient Yes   Consultants:   PCCM.  Procedures: Tracheostomy and PEG tube.  Antimicrobials:   Anti-infectives (From admission, onward)   Start     Dose/Rate Route Frequency Ordered Stop   07/14/20 1100  piperacillin-tazobactam (ZOSYN) IVPB 3.375 g        3.375 g 12.5 mL/hr over 240 Minutes Intravenous Every 8 hours 07/14/20 0946 07/18/20 2359   07/08/20 1145  amoxicillin-clavulanate (AUGMENTIN) 875-125 MG per tablet 1 tablet  Status:  Discontinued        1 tablet Per Tube Every 12 hours 07/08/20 1055 07/11/20 0803   07/06/20 1530  piperacillin-tazobactam (ZOSYN) IVPB 3.375 g  Status:  Discontinued        3.375 g 12.5 mL/hr over 240 Minutes Intravenous Every 8 hours 07/06/20 1433 07/08/20 1055   06/21/20 1200  ceFAZolin (ANCEF) IVPB 2g/100 mL premix       Note to Pharmacy: Please have at bedside. Will be administered one hour prior to procedure. Endo staff will instruct on when to start infusion.   2 g 200 mL/hr over 30 Minutes Intravenous  Once 06/21/20 0820 06/21/20 2030   06/07/20 1400  azithromycin (ZITHROMAX) 500 mg in sodium chloride 0.9 % 250 mL IVPB   Status:  Discontinued        500 mg 250 mL/hr over 60 Minutes Intravenous Every 24 hours 06/06/20 2030 06/06/20 2044   06/07/20 1400  azithromycin (ZITHROMAX) 500 mg in sodium chloride 0.9 % 250 mL IVPB  Status:  Discontinued        500 mg 250 mL/hr over 60 Minutes Intravenous Every 24 hours 06/06/20 2044 06/08/20 1031   06/06/20 1415  cefTRIAXone (ROCEPHIN) 2 g in sodium chloride 0.9 % 100 mL IVPB        2 g 200 mL/hr over 30 Minutes Intravenous  Once 06/06/20 1407 06/06/20 1514   06/06/20 1415  azithromycin (ZITHROMAX) 500 mg in sodium chloride 0.9 % 250 mL IVPB        500 mg 250 mL/hr over 60 Minutes Intravenous  Once 06/06/20 1407 06/06/20 1708      Subjective: Patient was seen and examined at bedside.  Overnight events noted.  Patient seems improved and comfortable.   She uses [employer] valve to communicate.  Patient has passed MBS,  started on dysphagia 2 diet,  tolerating well.. She needs  tracheostomy and PEG care.   Objective: Vitals:   07/26/20 0357 07/26/20 0734 07/26/20 1104 07/26/20 1138  BP: 114/74  116/79 124/87  Pulse: 72  90 81  Resp: '18  16 18  ' Temp: 98.2 F (36.8 C)  98.7 F (37.1 C) 98.6 F (37 C)  TempSrc: Oral  Oral Oral  SpO2: 99% 99% 98% 99%  Weight:      Height:        Intake/Output Summary (Last 24 hours) at 07/26/2020 1442 Last data filed at 07/26/2020 1335 Gross per 24 hour  Intake 2405 ml  Output 2 ml  Net 2403 ml   Filed Weights   07/16/20 0500 07/17/20 0426 07/18/20 0500  Weight: 56.4 kg 55.4 kg 54.9 kg    Examination:  General exam: Appears calm and comfortable , trach collar noted. Respiratory system: Clear to auscultation. Respiratory effort normal. Cardiovascular system: S1 & S2 heard, RRR. No JVD, murmurs, rubs, gallops or clicks. No pedal edema. Gastrointestinal system: Abdomen is nondistended, soft and nontender. No organomegaly or masses felt.  Normal bowel sounds heard.   PEG tube in place. Central nervous system: Alert and  oriented. No focal neurological deficits. Extremities: Symmetric 5 x 5 power. Skin: No rashes, lesions or ulcers Psychiatry: Judgement and insight appear normal. Mood & affect appropriate.     Data Reviewed: I have personally reviewed following labs and imaging studies  CBC: Recent Labs  Lab 07/23/20 0419 07/24/20 0528 07/25/20 0341 07/26/20 0349  WBC 8.0 6.8 7.6 8.2  HGB 9.9* 10.0* 9.8* 9.5*  HCT 32.7* 32.2* 32.5* 31.5*  MCV 92.9 92.0 92.9 94.3  PLT 359 369 373 412   Basic Metabolic Panel: Recent Labs  Lab 07/23/20 0419 07/24/20 0528 07/25/20 0341 07/26/20 0349  NA 138 141 140 140  K 4.1 4.0 4.3 4.6  CL 95* 97* 94* 96*  CO2 36* 35* 35* 36*  GLUCOSE 91 81 86 87  BUN '16 18 21 19  ' CREATININE 0.54 0.61 0.66 0.57  CALCIUM 9.4 9.5 9.2 8.9  MG 2.0 2.0  --  2.0  PHOS 4.4 5.2*  --  4.2   GFR: Estimated Creatinine Clearance: 52.5 mL/min (by C-G formula based on SCr of 0.57 mg/dL). Liver Function Tests: No results for input(s): AST, ALT, ALKPHOS, BILITOT, PROT, ALBUMIN in the last 168 hours. No results for input(s): LIPASE, AMYLASE in the last 168 hours. No results for input(s): AMMONIA in the last 168 hours. Coagulation Profile: No results for input(s): INR, PROTIME in the last 168 hours. Cardiac Enzymes: No results for input(s): CKTOTAL, CKMB, CKMBINDEX, TROPONINI in the last 168 hours. BNP (last 3 results) No results for input(s): PROBNP in the last 8760 hours. HbA1C: No results for input(s): HGBA1C in the last 72 hours. CBG: Recent Labs  Lab 07/21/20 0412 07/21/20 0828 07/21/20 1130 07/21/20 1611 07/23/20 2112  GLUCAP 90 80 92 88 110*   Lipid Profile: No results for input(s): CHOL, HDL, LDLCALC, TRIG, CHOLHDL, LDLDIRECT in the last 72 hours. Thyroid Function Tests: No results for input(s): TSH, T4TOTAL, FREET4, T3FREE, THYROIDAB in the last 72 hours. Anemia Panel: No results for input(s): VITAMINB12, FOLATE, FERRITIN, TIBC, IRON, RETICCTPCT in the last  72 hours. Sepsis Labs: No results for input(s): PROCALCITON, LATICACIDVEN in the last 168 hours.  No results found for this or any previous visit (from the past 240 hour(s)).  Radiology Studies: No results found.  Scheduled Meds: . acidophilus  1 capsule Oral TID  . apixaban  5 mg  Per Tube BID  . arformoterol  15 mcg Nebulization BID  . chlorhexidine gluconate (MEDLINE KIT)  15 mL Mouth Rinse BID  . clonazePAM  1 mg Per Tube BID  . feeding supplement (KATE FARMS STANDARD 1.4)  325 mL Per Tube QID  . fentaNYL  1 patch Transdermal Q72H  . fiber  1 packet Per Tube TID  . free water  120 mL Per Tube QID  . gabapentin  300 mg Per Tube TID  . glycopyrrolate  1 mg Per Tube TID  . mouth rinse  15 mL Mouth Rinse 10 times per day  . metoprolol tartrate  50 mg Oral BID  . pantoprazole sodium  40 mg Per Tube QHS  . QUEtiapine  25 mg Per Tube QHS   Continuous Infusions:   LOS: 50 days    Time spent: 25 mins.    Shawna Clamp, MD [facility]   If 7PM-7AM, please contact night-coverage

## 2020-07-26 NOTE — Care Management Important Message (Signed)
Important Message  Patient Details  Name: Virginia [name] MRN: [id number] Date of Birth: 07/24/1950   Medicare Important Message Given:  Yes     Johnell Comings 07/26/2020, 10:48 AM

## 2020-07-27 DIAGNOSIS — G9341 Metabolic encephalopathy: Secondary | ICD-10-CM | POA: Diagnosis not present

## 2020-07-27 DIAGNOSIS — J9621 Acute and chronic respiratory failure with hypoxia: Secondary | ICD-10-CM | POA: Diagnosis not present

## 2020-07-27 DIAGNOSIS — J9622 Acute and chronic respiratory failure with hypercapnia: Secondary | ICD-10-CM | POA: Diagnosis not present

## 2020-07-27 LAB — CBC
HCT: 32.6 % — ABNORMAL LOW (ref 36.0–46.0)
Hemoglobin: 9.5 g/dL — ABNORMAL LOW (ref 12.0–15.0)
MCH: 28.1 pg (ref 26.0–34.0)
MCHC: 29.1 g/dL — ABNORMAL LOW (ref 30.0–36.0)
MCV: 96.4 fL (ref 80.0–100.0)
Platelets: 357 10*3/uL (ref 150–400)
RBC: 3.38 MIL/uL — ABNORMAL LOW (ref 3.87–5.11)
RDW: 15.5 % (ref 11.5–15.5)
WBC: 9.1 10*3/uL (ref 4.0–10.5)
nRBC: 0 % (ref 0.0–0.2)

## 2020-07-27 MED ORDER — PANTOPRAZOLE SODIUM 40 MG PO TBEC
40.0000 mg | DELAYED_RELEASE_TABLET | Freq: Every day | ORAL | Status: DC
  Administered 2020-07-27 – 2020-08-10 (×15): 40 mg via ORAL
  Filled 2020-07-27 (×15): qty 1

## 2020-07-27 MED ORDER — ENSURE ENLIVE PO LIQD
237.0000 mL | Freq: Two times a day (BID) | ORAL | Status: DC
  Administered 2020-07-28 – 2020-08-01 (×2): 237 mL via ORAL

## 2020-07-27 MED ORDER — KATE FARMS STANDARD 1.4 PO LIQD
325.0000 mL | Freq: Three times a day (TID) | ORAL | Status: DC
  Administered 2020-07-27: 325 mL
  Filled 2020-07-27: qty 325

## 2020-07-27 NOTE — Progress Notes (Addendum)
PROGRESS NOTE    MERILEE WIBLE  BTD:[id number] DOB: 1950-08-05 DOA: 06/06/2020 PCP: Marguarite Arbour, MD    Brief Narrative: This 70 years old female with severe COPD admitted with acute on chronic hypoxic and hypercapnic respiratory failure and acute encephalopathy secondary to narcotic use and advanced COPD requiring mechanical ventilation.  She was treated with steroids and IV antibiotics.  She could not be liberated from the ventilator so tracheostomy was performed and she has been receiving oxygen via trach collar. Patient developed worsening hypoxia on 1/15.  Chest x-ray shows bilateral lower lobe infiltrate consistent with aspiration pneumonia. She has completed antibiotics.  On 1/20 patient had a sudden onset of altered mental status,  ABG shows severe metabolic acidosis. Patient was transferred to ICU. PCCM pickup 1/24: Acute encephalopathy resolved.  CT head unremarkable for any acute stroke or bleeding.  Mental status has normalized without any focal deficit. COPD appears stable.  1/26 -> transfer to med-surg. Waiting for LTAC decision. 1/27-> LTAC declined.  Waiting for SNF placement. 1/28 -> oxygen requirement up to 10 L after PT/OT session.   1/29: > RRT called, O2 saturation dropped to 70s.  Found to have mucous plugging in the trach which was removed,  O2 saturation improved. 1/30- > Patient appears improved.  She is lying comfortably.  Oxygen requirement via trach 10 L. 1/31-> Given severe baseline COPD, would be difficult to wean trach.    2/1- > Difficult placement in LTAC.  TOC working to find place. [facility] in [place] accepts trachs. Awaiting response.  2/2: > Patient passed MBS,  started on dysphagia 2 diet.  She is tolerating well.  Patient is requesting to remove the PEG tube before discharge.  2/3: >  Reached out to GI Dr. Mia Creek about removing the PEG tube.    Assessment & Plan:   Active Problems:   AF (paroxysmal atrial fibrillation)  (HCC)   Anemia of chronic disease   COPD with acute lower respiratory infection (HCC)   Dysphagia   Respiratory failure (HCC)   COPD exacerbation (HCC)   Acute urinary retention   Acute metabolic encephalopathy   Acute on chronic respiratory failure with hypoxia and hypercapnia (HCC)   Aspiration pneumonia of both lower lobes due to gastric secretions (HCC)   Reactive thrombocytosis   Pressure injury of skin   Diarrhea   Acute on chronic hypoxic and hypercapnic respiratory failure: COPD exacerbation: Stable. Aspiration pneumonia :  Completed IV zosyn on 1/25. She is on trach collar, O2 sats 97% on 10 L, wean as able. She had a transient episode of moderate respiratory distress earlier due to blocked mucous/obstructed trach.  Now resolved Tracheal aspirate growing Enterobacter aerogenes Continue Glycopyrrolate for secretions. She is satting 97% on trach @ 8L.  AcuteMetabolic Encephalopathy:  >>>Resolved. Patient had a sudden onset of slurred speech and altered mental status. Differential including mucous plug, aspiration pneumoniaversus acute intracranial hemorrhage. Stat ABG showed pH of 7.0. Patient was not responding, code stroke called. CT scan ruled out intracranial hemorrhage. Patient does not have hypoglycemia. Patient was transferred to ICU. She is back to her baseline mental status. Stopping Narcotics one at a time (stopped norco on 1/26)   Severe metabolic acidosis>> resolved. Etiology still unclear, it appears to be sudden onset.  Diarrhea: >>> Nursing reports 2 large loose bowel movement on 1/28. Patient has been on antibiotics but no leukocytosis or abdominal pain,   ID recommended no need for C. difficile testing. Could be tube  feed related.  Could consider Imodium as needed  Dysphagia. Deconditioning Moderate protein calorie malnutrition. On TFs and tolerating. Patient has passed modified barium swallow. Started on dysphagia 2 diet, tolerating  well. Patient is requesting to remove the PEG tube before discharge. Reached out to GI Dr. Mia Creek , PEG tube needs to stay in place for six weeks before removal. She still has a week or so before it can be removed safely. 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 If removed too early she could require an emergent surgery.   Anxiety :  Continue Klonopin and valium for breakthrough.  Agitation  >> Improved. Remained on Precedex drip for agitation. She is off precedex,   Continue Klonopin and Valium.  Paroxysmal A. Fib:  Continue Eliquis,  EKG: normal sinus rhythm. Heart rate now controlled. Metoprolol 50 mg twice daily resumed.    DVT prophylaxis:  Lovenox Code Status: Full code. Family Communication: Spoke with daughter sherry. Disposition Plan:  Status is: Inpatient  Remains inpatient appropriate because:Inpatient level of care appropriate due to severity of illness   Dispo: The patient is from: Home              Anticipated d/c is to: SNF              Anticipated d/c date is: > 3 days              Patient currently is not medically stable to d/c. TOC team working on placement/SNF versus CIR   Difficult to place patient Yes   Consultants:   PCCM.  Procedures: Tracheostomy and PEG tube.  Antimicrobials:   Anti-infectives (From admission, onward)   Start     Dose/Rate Route Frequency Ordered Stop   07/14/20 1100  piperacillin-tazobactam (ZOSYN) IVPB 3.375 g        3.375 g 12.5 mL/hr over 240 Minutes Intravenous Every 8 hours 07/14/20 0946 07/18/20 2359   07/08/20 1145  amoxicillin-clavulanate (AUGMENTIN) 875-125 MG per tablet 1 tablet  Status:  Discontinued        1 tablet Per Tube Every 12 hours 07/08/20 1055 07/11/20 0803   07/06/20 1530  piperacillin-tazobactam (ZOSYN) IVPB 3.375 g  Status:  Discontinued        3.375 g 12.5 mL/hr over 240 Minutes Intravenous Every 8 hours 07/06/20 1433 07/08/20 1055    06/21/20 1200  ceFAZolin (ANCEF) IVPB 2g/100 mL premix       Note to Pharmacy: Please have at bedside. Will be administered one hour prior to procedure. Endo staff will instruct on when to start infusion.   2 g 200 mL/hr over 30 Minutes Intravenous  Once 06/21/20 0820 06/21/20 2030   06/07/20 1400  azithromycin (ZITHROMAX) 500 mg in sodium chloride 0.9 % 250 mL IVPB  Status:  Discontinued        500 mg 250 mL/hr over 60 Minutes Intravenous Every 24 hours 06/06/20 2030 06/06/20 2044   06/07/20 1400  azithromycin (ZITHROMAX) 500 mg in sodium chloride 0.9 % 250 mL IVPB  Status:  Discontinued        500 mg 250 mL/hr over 60 Minutes Intravenous Every 24 hours 06/06/20 2044 06/08/20 1031   06/06/20 1415  cefTRIAXone (ROCEPHIN) 2 g in sodium chloride 0.9 % 100 mL IVPB        2 g 200 mL/hr over 30 Minutes Intravenous  Once 06/06/20 1407 06/06/20 1514   06/06/20 1415  azithromycin (ZITHROMAX) 500 mg  in sodium chloride 0.9 % 250 mL IVPB        500 mg 250 mL/hr over 60 Minutes Intravenous  Once 06/06/20 1407 06/06/20 1708      Subjective: Patient was seen and examined at bedside.  Overnight events noted.  Patient seems improved and comfortable.   She uses [employer] valve to communicate.  Patient has been tolerating dysphagia 2 diet , requesting to remove the PEG tube.   She needs tracheostomy and PEG care.   Objective: Vitals:   07/26/20 2346 07/27/20 0440 07/27/20 0830 07/27/20 0951  BP: 112/66 120/73  130/90  Pulse: 74 85  96  Resp: 20 18  (!) 21  Temp: 98.2 F (36.8 C) 98.2 F (36.8 C)  98.4 F (36.9 C)  TempSrc: Oral Oral  Oral  SpO2: 98% 100% 100% 100%  Weight:      Height:        Intake/Output Summary (Last 24 hours) at 07/27/2020 1344 Last data filed at 07/27/2020 0400 Gross per 24 hour  Intake 100 ml  Output 125 ml  Net -25 ml   Filed Weights   07/16/20 0500 07/17/20 0426 07/18/20 0500  Weight: 56.4 kg 55.4 kg 54.9 kg    Examination:  General exam: Appears calm and  comfortable , trach collar noted. Respiratory system: Clear to auscultation. Respiratory effort normal. Cardiovascular system: S1 & S2 heard, RRR. No JVD, murmurs, rubs, gallops or clicks. No pedal edema. Gastrointestinal system: Abdomen is nondistended, soft and nontender. No organomegaly or masses felt.  Normal bowel sounds heard.   PEG tube in place. Central nervous system: Alert and oriented. No focal neurological deficits. Extremities: Symmetric 5 x 5 power. Skin: No rashes, lesions or ulcers Psychiatry: Judgement and insight appear normal. Mood & affect appropriate.     Data Reviewed: I have personally reviewed following labs and imaging studies  CBC: Recent Labs  Lab 07/23/20 0419 07/24/20 0528 07/25/20 0341 07/26/20 0349 07/27/20 0447  WBC 8.0 6.8 7.6 8.2 9.1  HGB 9.9* 10.0* 9.8* 9.5* 9.5*  HCT 32.7* 32.2* 32.5* 31.5* 32.6*  MCV 92.9 92.0 92.9 94.3 96.4  PLT 359 369 373 344 357   Basic Metabolic Panel: Recent Labs  Lab 07/23/20 0419 07/24/20 0528 07/25/20 0341 07/26/20 0349  NA 138 141 140 140  K 4.1 4.0 4.3 4.6  CL 95* 97* 94* 96*  CO2 36* 35* 35* 36*  GLUCOSE 91 81 86 87  BUN 16 18 21 19   CREATININE 0.54 0.61 0.66 0.57  CALCIUM 9.4 9.5 9.2 8.9  MG 2.0 2.0  --  2.0  PHOS 4.4 5.2*  --  4.2   GFR: Estimated Creatinine Clearance: 52.5 mL/min (by C-G formula based on SCr of 0.57 mg/dL). Liver Function Tests: No results for input(s): AST, ALT, ALKPHOS, BILITOT, PROT, ALBUMIN in the last 168 hours. No results for input(s): LIPASE, AMYLASE in the last 168 hours. No results for input(s): AMMONIA in the last 168 hours. Coagulation Profile: No results for input(s): INR, PROTIME in the last 168 hours. Cardiac Enzymes: No results for input(s): CKTOTAL, CKMB, CKMBINDEX, TROPONINI in the last 168 hours. BNP (last 3 results) No results for input(s): PROBNP in the last 8760 hours. HbA1C: No results for input(s): HGBA1C in the last 72 hours. CBG: Recent Labs  Lab  07/21/20 0412 07/21/20 0828 07/21/20 1130 07/21/20 1611 07/23/20 2112  GLUCAP 90 80 92 88 110*   Lipid Profile: No results for input(s): CHOL, HDL, LDLCALC, TRIG, CHOLHDL, LDLDIRECT in  the last 72 hours. Thyroid Function Tests: No results for input(s): TSH, T4TOTAL, FREET4, T3FREE, THYROIDAB in the last 72 hours. Anemia Panel: No results for input(s): VITAMINB12, FOLATE, FERRITIN, TIBC, IRON, RETICCTPCT in the last 72 hours. Sepsis Labs: No results for input(s): PROCALCITON, LATICACIDVEN in the last 168 hours.  No results found for this or any previous visit (from the past 240 hour(s)).  Radiology Studies: No results found.  Scheduled Meds: . acidophilus  1 capsule Oral TID  . apixaban  5 mg Per Tube BID  . arformoterol  15 mcg Nebulization BID  . clonazePAM  1 mg Per Tube BID  . feeding supplement (KATE FARMS STANDARD 1.4)  325 mL Per Tube QID  . fentaNYL  1 patch Transdermal Q72H  . fiber  1 packet Per Tube TID  . free water  120 mL Per Tube QID  . gabapentin  300 mg Per Tube TID  . glycopyrrolate  1 mg Per Tube TID  . mouth rinse  15 mL Mouth Rinse 10 times per day  . metoprolol tartrate  50 mg Oral BID  . nystatin   Topical TID  . pantoprazole sodium  40 mg Per Tube QHS  . QUEtiapine  25 mg Per Tube QHS   Continuous Infusions:   LOS: 51 days    Time spent: 25 mins.    Cipriano Bunker, MD [facility]   If 7PM-7AM, please contact night-coverage

## 2020-07-27 NOTE — Progress Notes (Signed)
Patient is refusing to wear continuous pulse ox.

## 2020-07-27 NOTE — NC FL2 (Signed)
[place] MEDICAID FL2 LEVEL OF CARE SCREENING TOOL     IDENTIFICATION  Patient Name: Virginia [name] Birthdate: 12-25-1950 Sex: female Admission Date (Current Location): 06/06/2020  [place] and [place] Number:  [address] [id number] [facility] Facility and Address:  [facility], [address], [place], [place] [address]      Provider Number: [id number]  Attending Physician Name and Address:  Cipriano Bunker, MD  Relative Name and Phone Number:  [name] Relative [phone]    Current Level of Care: Hospital Recommended Level of Care: Skilled Nursing Facility Prior Approval Number:    Date Approved/Denied:   PASRR Number: [id number]  Discharge Plan: SNF    Current Diagnoses: Patient Active Problem List   Diagnosis Date Noted  . Pressure injury of skin 07/14/2020  . Diarrhea   . Reactive thrombocytosis 07/12/2020  . Aspiration pneumonia of both lower lobes due to gastric secretions (HCC) 07/07/2020  . Acute urinary retention 07/06/2020  . Acute metabolic encephalopathy 07/06/2020  . Acute on chronic respiratory failure with hypoxia and hypercapnia (HCC) 07/06/2020  . COPD exacerbation (HCC)   . Respiratory failure (HCC) 06/06/2020  . Acute respiratory failure with hypoxia (HCC) 06/02/2020  . COPD with acute exacerbation (HCC) 06/02/2020  . Shortness of breath 06/02/2020  . GERD (gastroesophageal reflux disease) 06/02/2020  . Iron deficiency anemia 01/22/2020  . Anemia of chronic disease 01/17/2019  . Coronary artery disease involving native coronary artery of native heart 04/30/2018  . Palpitations 04/30/2018  . Chronic heartburn 04/12/2018  . Dysphagia 04/12/2018  . Thoracic aortic aneurysm without rupture (HCC) 08/27/2017  . 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 SOBOE (shortness of breath on exertion) 01/24/2017  . AF (paroxysmal atrial fibrillation) (HCC) 11/09/2016  . Chronic hypoxemic respiratory failure  (HCC) 12/06/2015  . Depression with anxiety 12/06/2015  . Fibromyalgia 12/06/2015  . Hep C w/o coma, chronic (HCC) 12/06/2015  . History of tracheostomy 12/06/2015  . HTN, goal below 140/80 12/06/2015  . Localized osteoporosis without current pathological fracture 12/06/2015  . PEG (percutaneous endoscopic gastrostomy) status (HCC) 12/06/2015  . Peripheral neuropathy, idiopathic 12/06/2015  . RLS (restless legs syndrome) 12/06/2015  . 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  . COPD with acute lower respiratory infection (HCC) 11/29/2015    Orientation RESPIRATION BLADDER Height & Weight     Self,Time,Situation,Place  Tracheostomy (Trach placed 12/23. Currently cuffed and 10 L at 40%. Will have to be weaned down before discharge.) Continent Weight: 121 lb 0.5 oz (54.9 kg) Height:  5' 2.01" (157.5 cm)  BEHAVIORAL SYMPTOMS/MOOD NEUROLOGICAL BOWEL NUTRITION STATUS   (None)  (None) Continent Diet,Feeding tube (DYS 2. Extra Gravy on meats, potatoes. Cream Soups. Puddings, yogurts. NO STRAWS!! Has a PEG that was placed in 2017.)  AMBULATORY STATUS COMMUNICATION OF NEEDS Skin   Limited Assist Verbally Bruising,Other (Comment),PU Stage and Appropriate Care (Weeping. MASD on bilateral anterior groin: No dressing.)   PU Stage 2 Dressing:  (Left buttocks: Foam every 3 days.)                   Personal Care Assistance Level of Assistance  Bathing,Feeding,Dressing Bathing Assistance: Limited assistance Feeding assistance: Limited assistance Dressing Assistance: Limited assistance   Functional Limitations Info  Sight,Hearing,Speech Sight Info: Adequate Hearing Info: Adequate Speech Info: Adequate    SPECIAL CARE FACTORS FREQUENCY  PT (By licensed PT),OT (By licensed OT),Speech therapy     PT Frequency: 5 x week OT Frequency: 5 x week     Speech  Therapy Frequency: 5 x week      Contractures Contractures Info: Not present    Additional Factors Info  Code Status,Allergies  Code Status Info: Full code Allergies Info: NKDA           Current Medications (07/27/2020):  This is the current hospital active medication list Current Facility-Administered Medications  Medication Dose Route Frequency Provider Last Rate Last Admin  . acetaminophen (TYLENOL) 160 MG/5ML solution 650 mg  650 mg Per Tube Q4H PRN Arbie Cookey, MD   650 mg at 07/19/20 1048  . acidophilus (RISAQUAD) capsule 1 capsule  1 capsule Oral TID Otelia Sergeant, RPH   1 capsule at 07/27/20 9629  . apixaban (ELIQUIS) tablet 5 mg  5 mg Per Tube BID Valrie Hart A, RPH   5 mg at 07/27/20 5284  . arformoterol (BROVANA) nebulizer solution 15 mcg  15 mcg Nebulization BID Arbie Cookey, MD   15 mcg at 07/27/20 0825  . clonazePAM (KLONOPIN) tablet 1 mg  1 mg Per Tube BID Arbie Cookey, MD   1 mg at 07/27/20 1324  . diazepam (VALIUM) tablet 5 mg  5 mg Per Tube Q6H PRN Arbie Cookey, MD   5 mg at 07/27/20 0947  . feeding supplement (KATE FARMS STANDARD 1.4) liquid 325 mL  325 mL Per Tube QID Delfino Lovett, MD   325 mL at 07/26/20 2111  . fentaNYL (DURAGESIC) 50 MCG/HR 1 patch  1 patch Transdermal Q72H Arbie Cookey, MD   1 patch at 07/26/20 1323  . fiber (NUTRISOURCE FIBER) 1 packet  1 packet Per Tube TID Delfino Lovett, MD   1 packet at 07/27/20 [phone]  . free water 120 mL  120 mL Per Tube QID Delfino Lovett, MD   120 mL at 07/26/20 2111  . gabapentin (NEURONTIN) capsule 300 mg  300 mg Per Tube TID Arbie Cookey, MD   300 mg at 07/27/20 2725  . glycopyrrolate (ROBINUL) tablet 1 mg  1 mg Per Tube TID Albina Billet, RPH   1 mg at 07/27/20 3664  . ipratropium-albuterol (DUONEB) 0.5-2.5 (3) MG/3ML nebulizer solution 3 mL  3 mL Nebulization Q6H PRN Arbie Cookey, MD   3 mL at 07/13/20 0611  . labetalol (NORMODYNE) injection 10 mg  10 mg Intravenous Q4H PRN Arbie Cookey, MD   10 mg at 06/28/20 4034  . MEDLINE mouth rinse  15 mL Mouth Rinse 10 times per day Arbie Cookey, MD   15 mL at 07/27/20 0201  . metoprolol  tartrate (LOPRESSOR) tablet 50 mg  50 mg Oral BID Cipriano Bunker, MD   50 mg at 07/27/20 7425  . naphazoline-glycerin (CLEAR EYES REDNESS) ophth solution 1-2 drop  1-2 drop Both Eyes QID PRN Arbie Cookey, MD   2 drop at 07/12/20 1040  . nystatin (MYCOSTATIN/NYSTOP) topical powder   Topical TID Cipriano Bunker, MD   Given at 07/26/20 2134  . ondansetron (ZOFRAN) injection 4 mg  4 mg Intravenous Q6H PRN Arbie Cookey, MD   4 mg at 07/19/20 1511  . pantoprazole sodium (PROTONIX) 40 mg/20 mL oral suspension 40 mg  40 mg Per Tube QHS Delfino Lovett, MD   40 mg at 07/26/20 2134  . QUEtiapine (SEROQUEL) tablet 25 mg  25 mg Per Tube QHS Salena Saner, MD   25 mg at 07/26/20 2110     Discharge Medications: Please see discharge summary for a list of discharge medications.  Relevant Imaging Results:  Relevant Lab Results:   Additional Information  SS#: [id number]  Margarito Liner, LCSW

## 2020-07-27 NOTE — Progress Notes (Signed)
Patient states her PEG tube will be removed and does not want to use it for medications or tube feeds.  Patient swallowing medications and tolerating well.  Patient eating Dysphagia diet and tolerating well. MD notified of patient request and states can hold tube feeds for now.

## 2020-07-27 NOTE — TOC Progression Note (Addendum)
Transition of Care [facility]) - Progression Note    Patient Details  Name: Virginia [name] MRN: [id number] Date of Birth: 11-29-1950  Transition of Care [facility]) CM/SW Contact  Margarito Liner, LCSW Phone Number: 07/27/2020, 10:54 AM  Clinical Narrative:   Faxed referral to [facility]F. Confirmed they do take trach patients. They said they have a respiratory therapist there Monday-Friday. Also sent updated clinicals to local SNF's and those in surrounding counties.  2:26 pm: [facility] unable to accept referral.    Barriers to Discharge: Continued Medical Work up  Expected Discharge Plan and Services   In-house Referral: Clinical Social Work,Hospice / Palliative Care   Post Acute Care Choice: Durable Medical Equipment (Oxygen 4L) Living arrangements for the past 2 months: Apartment                                       Social Determinants of Health (SDOH) Interventions    Readmission Risk Interventions No flowsheet data found.

## 2020-07-27 NOTE — Evaluation (Signed)
Occupational Therapy Evaluation Patient Details Name: Virginia [name] MRN: [id number] DOB: 06-30-1950 Today's Date: 07/27/2020    History of Present Illness 70 yo female with severe COPD admitted with acute on chronic hypoxic hypercapnic respiratory failure and acute encephalopathy secondary to narcotic use and AECOPD requiring mechanical intubation. Initially intubated from 12/14-12/16 and then again from 12/18-12/23. Now with + trach collar. Recent hospitalization from 12/10-12/12/21. RR secondary to slurred speech and AMS. Transferred to CCU on 1/20 on full vent support. RE-evaluation performed this date as medical status has improved.   Clinical Impression   Pt seen for OT re-evaluation this date in setting of prolonged hospitalization. OT faciltiates ed re: importance of OOB activity/doing as much for herself as possible. Pt motivated and less self-limiting with session today as she is wanting to wash her hair. OT engages pt in sup to sit, fxl mobility to sink with RW with CGA. Pt able to tolerates 3 stnading bouts to perform bathign tasks sink side with SETUP/CGA with RW for balance, requires 3 seated rest breaks for 3-4 minutes each. MIN A for thorogh completion of LB bathing/dressing tasks. Pt able to perform fxl mobility back to bed with RW and is left seated EOB to drink her coffee as requested RN notified and okay'ed. Of note: Pt on 8L/30% FiO2 throughout session, OT did titrate up to 10L momentarily during fxl mobility/standing tasks as pt with questionable pleth spO2 at 85%, but once pt cleared secretions, spO2 noted to be greater than/equal to 95% throughout session with her tolerating titrating back to the 8L. RN notified. At this time, continue to feel pt could benefit from INPT rehabilitation stay and am concerned about her very limited functional activity tolerance. However, I do want to note that pt is improving with fxl activity tolerance, strength and general safety with her self  care. Will continue to monitor progress for bed d/c recommendations. Should pt return home which she shares with this therapist, is her goal; anticipate pt will require the below-listed medical equipment and 24/7 SUPV.     Follow Up Recommendations  CIR;SNF    Equipment Recommendations  3 in 1 bedside commode;Tub/shower seat;Other (comment) (2ww)    Recommendations for Other Services       Precautions / Restrictions Precautions Precautions: Fall Precaution Comments: trach Restrictions Weight Bearing Restrictions: No Other Position/Activity Restrictions: HR/ RR      Mobility Bed Mobility Overal bed mobility: Modified Independent Bed Mobility: Supine to Sit;Sit to Supine                Transfers Overall transfer level: Needs assistance Equipment used: Rolling walker (2 wheeled) Transfers: Sit to/from Stand Sit to Stand: Min guard              Balance Overall balance assessment: Needs assistance Sitting-balance support: Feet supported Sitting balance-Leahy Scale: Good     Standing balance support: Bilateral upper extremity supported;During functional activity Standing balance-Leahy Scale: Fair Standing balance comment: UE suport beneficial for EC                           ADL either performed or assessed with clinical judgement   ADL Overall ADL's : Needs assistance/impaired     Grooming: Wash/dry hands;Wash/dry face;Oral care;Supervision/safety;Min guard;Standing Grooming Details (indicate cue type and reason): with RW sink-side, and able to brush hair with SETUP in sitting (requires more of her energy reserve) Upper Body Bathing: Set up;Min guard;Standing Upper Body Bathing Details (  indicate cue type and reason): sink-side with RW after 4 minute seated rest break Lower Body Bathing: Minimal assistance;Sit to/from stand Lower Body Bathing Details (indicate cue type and reason): sink-side using BSC for seated rest breaks as needed, RW for  standing balance. Upper Body Dressing : Set up;Sitting   Lower Body Dressing: Minimal assistance;Sitting/lateral leans Lower Body Dressing Details (indicate cue type and reason): to thread socks Toilet Transfer: Min guard;RW;Ambulation;BSC (to [facility] ~10' away from bed)   Toileting- Clothing Manipulation and Hygiene: Min guard;Minimal assistance;Sit to/from stand Toileting - Clothing Manipulation Details (indicate cue type and reason): from [facility] with RW     Functional mobility during ADLs: Min guard;Rolling walker (to perform fxl mobility to/from sink with RW, min cues for safe hand placement)       Vision Patient Visual Report: No change from baseline       Perception     Praxis      Pertinent Vitals/Pain Pain Assessment: Faces Pain Score: 2  Faces Pain Scale: Hurts a little bit Pain Location: L ankle Pain Descriptors / Indicators: Aching;Constant Pain Intervention(s): Monitored during session     Hand Dominance Right   Extremity/Trunk Assessment Upper Extremity Assessment Upper Extremity Assessment: RUE deficits/detail;LUE deficits/detail RUE Deficits / Details: grossly 4-/5 LUE Deficits / Details: grossly 4-/5   Lower Extremity Assessment Lower Extremity Assessment: Overall WFL for tasks assessed;Generalized weakness       Communication Communication Communication: Tracheostomy   Cognition Arousal/Alertness: Awake/alert Behavior During Therapy: WFL for tasks assessed/performed;Anxious Overall Cognitive Status: Within Functional Limits for tasks assessed                                     General Comments  Pt on 8L/30% FiO2 throughout session, OT did titrate up to 10L momentarily during fxl mobility/standing tasks as pt with questionable pleth spO2 at 85%, but once pt cleared secretions, spO2 noted to be greater than/equal to 95% throughout session with her tolerating titrating back to the 8L. RN notified.    Exercises Other Exercises Other  Exercises: OT faciltiates ed re: importance of OOB activity/doing as much for herself as possible. Pt motivated and less self-limiting with session today as she is wanting to wash her hair. OT engages pt in sup to sit, fxl mobility to sink with RW with CGA. Pt able to tolerates 3 stnading bouts to perform bathign tasks sink side with SETUP/CGA with RW for balance, requires 3 seated rest breaks for 3-4 minutes each. MIN A for thorogh completion of LB bathing/dressing tasks. Pt able to perform fxl mobility back to bed with RW and is left seated EOB to drink her coffee as requested RN notified and okay'ed.   Shoulder Instructions      Home Living Family/patient expects to be discharged to:: Private residence Living Arrangements: Alone Available Help at Discharge: Family;Available PRN/intermittently Type of Home: Apartment Home Access: Level entry     Home Layout: One level     Bathroom Shower/Tub: Tub/shower unit         Home Equipment: Emergency planning/management officer - 4 wheels          Prior Functioning/Environment Level of Independence: Independent with assistive device(s)        Comments: MOD I using RW including driving        OT Problem List: Decreased strength;Decreased range of motion;Decreased activity tolerance;Impaired balance (sitting and/or standing);Decreased cognition;Decreased safety awareness;Pain  OT Treatment/Interventions: Self-care/ADL training;Therapeutic exercise;Energy conservation;DME and/or AE instruction;Therapeutic activities;Patient/family education;Balance training    OT Goals(Current goals can be found in the care plan section) Acute Rehab OT Goals Patient Stated Goal: To return home safely OT Goal Formulation: With patient Time For Goal Achievement: 08/10/20 Potential to Achieve Goals: Good ADL Goals Pt Will Perform Grooming: with set-up;with supervision;standing (with LRAD sink-side, self-initiating rest breaks as needed.) Pt Will Perform Lower Body  Dressing: with set-up;sit to/from stand (with LRAD and AE as needed.)  OT Frequency: Min 1X/week   Barriers to D/C: Decreased caregiver support          Co-evaluation              AM-PAC OT "6 Clicks" Daily Activity     Outcome Measure Help from another person eating meals?: A Little Help from another person taking care of personal grooming?: A Little Help from another person toileting, which includes using toliet, bedpan, or urinal?: A Little Help from another person bathing (including washing, rinsing, drying)?: A Little Help from another person to put on and taking off regular upper body clothing?: A Little Help from another person to put on and taking off regular lower body clothing?: A Little 6 Click Score: 18   End of Session Equipment Utilized During Treatment: Oxygen;Gait belt;Rolling walker Nurse Communication: Mobility status  Activity Tolerance: Patient tolerated treatment well Patient left: in bed;with call bell/phone within reach;with bed alarm set  OT Visit Diagnosis: Other abnormalities of gait and mobility (R26.89);Muscle weakness (generalized) (M62.81)                Time: 9629-5284 OT Time Calculation (min): 80 min Charges:  OT General Charges $OT Visit: 1 Visit OT Evaluation $OT Re-eval: 1 Re-eval OT Treatments $Self Care/Home Management : 38-52 mins $Therapeutic Activity: 23-37 mins  Rejeana Brock, MS, OTR/L ascom [phone] 07/27/20, 7:18 PM

## 2020-07-27 NOTE — Progress Notes (Signed)
Speech Language Pathology Treatment: Dysphagia;Passy Muir Speaking valve  Patient Details Name: Virginia [name] MRN: [id number] DOB: 08-19-1950 Today's Date: 07/27/2020 Time: 9629-5284 SLP Time Calculation (min) (ACUTE ONLY): 60 min  Assessment / Plan / Recommendation Clinical Impression  Pt seen today for ongoing assessment of toleration of PMV wear w/ education about use/care; toleration of oral intake w/ PMV placed. Pt had just returned from the bathroom and was getting back in bed. She reported min SOB; min increased WOB w/ exertion. She has successfully weaned from Vent to [facility] trials. Pt has a Shiley size 6, cuffed w/ cuff Deflated at Baseline. O2 sats 97%, RR low20s. Pt has been eating/drinking wearing her PMV per pt and NSG report w/out problems since MBSS.  PMV use and placement was explained to pt again -- practiced placing the PMV on the trach w/ hand over hand support. Pt was able to place and remove the PMV independently post practice and instruction. She maintained a calm breathing pattern and monitored her anxiety informing this SLP if becoming too anxious or SOB. Pt has had a Tracheostomy w/ PMV in past so she was given cues to reminders of her success in weaning then. PMV remained placed during full session and PO trials. Pt's vocal quality has greatly improved w/ breathiness and dysphonia intermittently when pt does not speak in short phrases replenishing her breath support; low volume. She engaged in short conversation w/ SLP about her goals to get the PEG out "today or tomorrow". She stated no gross difficulty w/ exhalation or discomfort breathing at rest. No pain endorsed. Intelligibility was adequate in a quiet setting. Suspect pt could benefit from a CUFFLESS trach -- less restriction of airflow especially when wearing the PMV.  Pt then consumed trials of thin liquids via straw w/ no overt s/s of aspiration noted; no decline in vocal quality, cough, or desaturations noted.  Reviewed and discussed general aspiration precautions; food consistencies for ease of swallowing -- pt continues to endorse min anxiety of "choking" w/ foods. Suspect the min Esophageal phase Dysmotility could be an impact. Pt agreed w/ continuing the minced foods diet at this time d/t broken-down nature of solids. Discussed swallowing Pills in Puree -- may need to Crush Larger Pills d/t size.  D/t pt's quick fatigue w/ any exertion/tasks, and min SOB at baseline w/ any exertion, recommend wearing PMV w/ monitoring of breathing and taking Rest Breaks when needed. Recommend changing trach to a CUFFLESS trach and pt to continue to increase wear and Stamina w/ PMV wear during conversation and communication in ADLs w/ others. Recommend continuing current Minced foods diet w/ thin liquids w/ general aspiration precautions -- pt MUST wear the PMV w/ all oral intake. Pills in Puree w/ NSG; NSG updated. Precautions posted in room. Pt is encouraged to wear the PMV during the Day but Remove when sleepy or needing to Rest; she MUST wear it during ANY oral intake. PMV wear must be Monitored if wearing during other Tx sessions(PT) for fatigue. Pt agreed w/ POC. MD updated re: request for CUFFLESS trach.     HPI HPI: Pt is a 70 yo female with severe COPD admitted with acute on chronic hypoxic hypercapnic respiratory failure and acute encephalopathy secondary to narcotic use and AECOPD requiring mechanical intubation. Pt received both Tracheostomy and PEG placement. She is currently weaning from vent to TC. Per pt report, this is pt's Second time w/ a Tracheostomy and PEG.  She was recently hospitalized from 12/10 to  12/12 following treatment of AECOPD.  She presented back to [facility] on 12/14 via EMS with altered mental status, cough, worsening shortness of breath, and wheezing. Pts daughter reports she has been taking narcotics along with seroquel. However, due to lack of improvement of mentation at admit, she required  mechanical intubation. COVID-19/Influenza PCR and CXR negative. She was subsequently admitted to ICU for additional workup and treatment.      SLP Plan  Continue with current plan of care       Recommendations  Diet recommendations: Dysphagia 2 (fine chop);Thin liquid Liquids provided via: Cup;Straw (monitor) Medication Administration: Whole meds with puree (for safer swallowing; Crush any Large pills) Supervision: Patient able to self feed;Intermittent supervision to cue for compensatory strategies Compensations: Minimize environmental distractions;Slow rate;Small sips/bites;Lingual sweep for clearance of pocketing;Multiple dry swallows after each bite/sip;Follow solids with liquid Postural Changes and/or Swallow Maneuvers: Seated upright 90 degrees;Upright 30-60 min after meal      Patient may use Passy-Muir Speech Valve: During all therapies with supervision;During all waking hours (remove during sleep);During PO intake/meals PMSV Supervision: Intermittent MD: Please consider changing trach tube to : Cuffless         General recommendations:  (PT following) Oral Care Recommendations: Oral care BID;Oral care before and after PO;Patient independent with oral care Follow up Recommendations:  (TBD) SLP Visit Diagnosis: Dysphagia, unspecified (R13.10);Aphonia (R49.1) (tracheostomy) Plan: Continue with current plan of care       GO                 Jerilynn Som, MS, CCC-SLP Speech Language Pathologist Rehab Services [phone] [facility] 07/27/2020, 4:21 PM

## 2020-07-27 NOTE — Progress Notes (Signed)
PULMONARY PROGRESS NOTE    Name: Virginia [name] MRN: [id number] DOB: [birth year]     LOS: 51   SUBJECTIVE FINDINGS & SIGNIFICANT EVENTS    Patient description:  70 yo female with severe COPD admitted with acute on chronic hypoxic hypercapnic respiratory failure and acute encephalopathy secondary to narcotic use and AECOPD requiring mechanical intubation  Now s/p trach/PEG working on trach weaning.  On TC at present, anxious. Remains on precedex.  06/27/20- patient is improved.  Signed out to [facility] - Dr Mayford Knife for pick up in am.   06/29/20- patient is with respiratory distress, she has high volume phlegm from trache, ive suctioned out whats possible via [place] and messaged RT to assist.  RN was able to come in and evaluate patient and she was able to communicate appropriately.   07/07/20- patient is in bed resting. She is clinically better today then yesterday. Resting in bed in no distress.  07/08/20- Patient is lucid and awake.  Son is at bedside we discussed care plan.  CXR in am.   1/16-17/22- patient resting in bed comfortbably no acute events overnight.   07/11/20- patient had episode of mild respiratory distress due to voluminous phlegm expectorated via trache. This was suctioned by RN with recovery. Patient is clear to auscultation with occasional low pitch wheezes.  She is being optimized for d/c.   07/20/20- patient had episode of mild-moderate respiratory distress due to obstructed trache with inspissated phlegm.  She is on 7-8L/min humidifed HFnc.  She is with ENTEROBACTER AEROGENES on trache aspirate culture. ID is following and there is abscence of leucosytosis or fevers in several days.    07/21/20- patient is with increased O2 requirement this morning up to 10L/min.  She is actually clinically  better in appearance and lung auscultation is improved from yesterday.  She is smiling during interview.  She has PT today and may have increased O2 in preparation for therapy. Plan to continue current care plan.   07/23/20- patient is very frustrated this am due to wanting to be [facility] home. She is able to communicate via [employer] valve.  She shares there are multiple people at home that care for her and she wishes to be dcd home. I have asked her to please cooperate and be patient however she is aggitated and wishes to go home.    07/26/2020- patient is sitting up in chair, she is eating lunch without regurgitation.  Daughter is in room during evaluation.  Patient is vocalizing well with PMV.  She is requesting reversal of PEG tube and dc home. She remains on 8L/min .  She should have PT/OT/CPT  07/26/20- patient is stable however on 8-10L with trache collar.  She is eating regular food despite trach and has passed thorough speech evaluation.  She requests removal of her PEG tube and has not used it.  Have discussed with Dr. Lucianne Muss and we are working on accommodating patient's request.  She continues to work with PT and OT.  She requests to be discharged home after medical optimization.   PHYSICAL EXAMINATION   Vital Signs: Temp:  [98.2 F (36.8 C)-98.7 F (37.1 C)] 98.2 F (36.8 C) (02/03 0440) Pulse Rate:  [74-94] 85 (02/03 0440) Resp:  [16-20] 18 (02/03 0440) BP: (112-161)/(66-91) 120/73 (02/03 0440) SpO2:  [92 %-100 %] 100 % (02/03 0830) FiO2 (%):  [40 %] 40 % (02/03 0830)   FiO2 (%):  [40 %] 40 %   Constitutional: anxious frail woman lying  in bed  Eyes: eomi, pupils equal Ears, nose, mouth, and throat: trach in place, minmal secretions Cardiovascular: RRR,  Ext warm Respiratory: diminished with prolonged expiratory phase, occasional accessory muscle use Gastrointestinal: soft, abd wrapped after PEG, some mild tenderness at PEG insertion site MSK: muscle wasting Skin: No rashes,  normal turgor, scattered bruising Neurologic: moves all 4 ext to command, weak Psychiatric: anxious, answering questions appropriately   Scheduled Meds: . acidophilus  1 capsule Oral TID  . apixaban  5 mg Per Tube BID  . arformoterol  15 mcg Nebulization BID  . clonazePAM  1 mg Per Tube BID  . feeding supplement ([employer] STANDARD 1.4)  325 mL Per Tube QID  . fentaNYL  1 patch Transdermal Q72H  . fiber  1 packet Per Tube TID  . free water  120 mL Per Tube QID  . gabapentin  300 mg Per Tube TID  . glycopyrrolate  1 mg Per Tube TID  . mouth rinse  15 mL Mouth Rinse 10 times per day  . metoprolol tartrate  50 mg Oral BID  . nystatin   Topical TID  . pantoprazole sodium  40 mg Per Tube QHS  . QUEtiapine  25 mg Per Tube QHS   Continuous Infusions:  PRN Meds:.acetaminophen (TYLENOL) oral liquid 160 mg/5 mL, diazepam, ipratropium-albuterol, labetalol, naphazoline-glycerin, ondansetron (ZOFRAN) IV    ASSESSMENT AND PLAN   Acute on chronic hypoxemic and hypercapnic respiratory failure thought secondary to polypharmacy long wean from ventilator due to muscular deconditioning now s/p tracheostomy.         -Heavy phlegm production - Glycopyrrolate tid 1mg -07/21/20  Dysphagia, moderate protein calorie malnutrition- now s/p PEG  Severe baseline COPD- complicated trach wean - continue COPD carepath  Muscular deconditioning-PT/OT/CPT  Chronic anxiety/depression  Metabolic encephalopathy/agitation- improving  - repeat tracheal aspirate ordered -ENTEROBACTER AEROGENES - Switched from standing duonebs from duonebs to brovana/yupelri - PT/OT up to chair - Continue standing clonazepam, use valium per tube for breakthrough - SLP for PMV trials - g off IV meds for rehab vs. SNF vs. LTACH vs home?    Patient chronically critically ill due to respiratory failure, metabolic encephalopathy Interventions to address this today weaning precedex, weaning vent Risk of deterioration without these  interventions is high    Patient would [facility] from [facility]. *This note was dictated using voice recognition software/Dragon.  Despite best efforts to proofread, errors can occur which can change the meaning.  Any change was purely unintentional.     [facility], M.D.  Pulmonary & Critical Care Medicine  [facility]

## 2020-07-27 NOTE — Progress Notes (Signed)
PT Cancellation Note  Patient Details Name: Virginia [name] MRN: [id number] DOB: April 25, 1951   Cancelled Treatment:     PT attempt. Pt currently working with OT/mobility tech. Acute PT will continue to follow and progress as able per POC.    Rushie Chestnut 07/27/2020, 10:13 AM

## 2020-07-27 NOTE — Plan of Care (Signed)
  Problem: Clinical Measurements: Goal: Ability to maintain clinical measurements within normal limits will improve Outcome: Progressing Goal: Will remain free from infection Outcome: Progressing Goal: Diagnostic test results will improve Outcome: Progressing Goal: Respiratory complications will improve Outcome: Progressing Goal: Cardiovascular complication will be avoided Outcome: Progressing   Problem: Activity: Goal: Risk for activity intolerance will decrease Outcome: Progressing   Problem: Pain Managment: Goal: General experience of comfort will improve Outcome: Progressing   Pt V/S stable. Remained on oxygen at 10L/trache, 40% Fi02. Suctioned as needed. Peg tube in place.

## 2020-07-27 NOTE — Progress Notes (Signed)
Nutrition Follow-up  DOCUMENTATION CODES:   Not applicable  INTERVENTION:   Change to [facility] Standard 1.4 Cal 1 carton (325 mL) TID per tube. Provides 1365 kcal, 60 grams of protein, 702 mL H2O daily.  Continue free water flush of 60 mL before and after each bolus feeding. Provides a total of 1062 mL H2O daily including water in tube feeding.  Add Ensure Enlive po BID, each supplement provides 350 kcal and 20 grams of protein  NUTRITION DIAGNOSIS:   Increased nutrient needs related to catabolic illness (COPD) as evidenced by estimated needs. Ongoing.  GOAL:   Patient will meet greater than or equal to 90% of their needs -Met with tube feeds   MONITOR:   PO intake,Supplement acceptance,Labs,Weight trends,Skin,I & O's  ASSESSMENT:   70 year old female with PMHx of COPD, hx tracheostomy tube placement and PEG tube placement in 2017, HTN, depression, RLS, osteoporosis, hepatitis C, A-fib, anxiety, arthritis admitted with acute on chronic hypoxic hypercapnic respiratory failure and acute encephalopathy secondary to narcotic use and acute exacerbation of COPD.   Met with pt in room today. Pt anxious and requesting to have her PEG tube removed. Pt was seen by SLP and was placed on a dysphagia 2/thin liquid diet 2/1. Pt reports eating 3/4 of some scrambled eggs and some applesauce for breakfast and a few bites of minced meat and mashed potatoes for lunch. Pt has consumed an estimated 217kcal and 13g of protein today. Explained to patient that she is not eating enough to meet her needs and that removing her PEG tube could result in weight loss and malnutrition. Pt's main reason for wanting to remove the PEG tube was that she thought that she would not be able to discharge with the tube in place and she is anxious to go home. Pt also thought that removing the tube was a surgical procedure that she would need to be sedated for and she did not want to come back to the hospital for that. RD  explained that pt can discharge home with the PEG tube and continue to eat with the tube in. Explained that the tube is there to provide additional calories and protein to help her maintain her weight until she is able to eat enough to meet her needs. RD also explained the process of removing the tube. Pt agrees to continue using the PEG tube to help her meet her estimated needs. RD will decrease pt to three cartons per day to try and increase her oral intake. RD will also add Ensure as pt believes she can drink 2-3 of these a day which will get her closer to meeting her needs and having the tube removed. Per chart, pt has remained fairly weight stable since admit.   Medications reviewed and include: risaquad, protonix, Nutrisource fiber   Labs reviewed: K 4.6 wnl, P 4.2 wnl, Mg 2.0 wnl- 2/2 Hgb 9.5(L), Hct 32.6(L)  Diet Order:   Diet Order            DIET DYS 2 Room service appropriate? Yes with Assist; Fluid consistency: Thin  Diet effective now                EDUCATION NEEDS:   No education needs have been identified at this time  Skin:  Skin Assessment: Skin Integrity Issues: Skin Integrity Issues:: Other (Comment),Stage II Stage II: left buttocks Incisions: N/A Other: MASD to groin and perineum  Last BM:  2/3- type 6  Height:  Ht Readings from Last 1 Encounters:  07/14/20 5' 2.01" (1.575 m)   Weight:   Wt Readings from Last 1 Encounters:  07/18/20 54.9 kg   Ideal Body Weight:  50 kg  BMI:  Body mass index is 22.13 kg/m.  Estimated Nutritional Needs:   Kcal:  1500-1700kcal/day  Protein:  75-85g/day  Fluid:  1.5-1.7 L/day  Koleen Distance MS, RD, LDN Please refer to [facility] for RD and/or RD on-call/weekend/after hours pager

## 2020-07-27 NOTE — Progress Notes (Signed)
PT Cancellation Note  Patient Details Name: Virginia [name] MRN: [id number] DOB: 03-Aug-1950   Cancelled Treatment:     PT attempt. Pt refused. She did have very productive session with OT earlier and  currently with pain in L ankle + endorsing extremely fatigued. Requested therapist return next date.PT continue to follow per POC.    Rushie Chestnut 07/27/2020, 1:43 PM

## 2020-07-28 ENCOUNTER — Inpatient Hospital Stay: Payer: Medicare Other

## 2020-07-28 DIAGNOSIS — J441 Chronic obstructive pulmonary disease with (acute) exacerbation: Secondary | ICD-10-CM | POA: Diagnosis not present

## 2020-07-28 DIAGNOSIS — J9621 Acute and chronic respiratory failure with hypoxia: Secondary | ICD-10-CM | POA: Diagnosis not present

## 2020-07-28 DIAGNOSIS — J9622 Acute and chronic respiratory failure with hypercapnia: Secondary | ICD-10-CM | POA: Diagnosis not present

## 2020-07-28 LAB — BASIC METABOLIC PANEL
Anion gap: 10 (ref 5–15)
BUN: 16 mg/dL (ref 8–23)
CO2: 39 mmol/L — ABNORMAL HIGH (ref 22–32)
Calcium: 9.5 mg/dL (ref 8.9–10.3)
Chloride: 90 mmol/L — ABNORMAL LOW (ref 98–111)
Creatinine, Ser: 0.66 mg/dL (ref 0.44–1.00)
GFR, Estimated: >60 mL/min (ref >60)
Glucose, Bld: 84 mg/dL (ref 70–99)
Potassium: 4.5 mmol/L (ref 3.5–5.1)
Sodium: 139 mmol/L (ref 135–145)

## 2020-07-28 LAB — CBC
HCT: 32.6 % — ABNORMAL LOW (ref 36.0–46.0)
Hemoglobin: 9.8 g/dL — ABNORMAL LOW (ref 12.0–15.0)
MCH: 28.6 pg (ref 26.0–34.0)
MCHC: 30.1 g/dL (ref 30.0–36.0)
MCV: 95 fL (ref 80.0–100.0)
Platelets: 345 10*3/uL (ref 150–400)
RBC: 3.43 MIL/uL — ABNORMAL LOW (ref 3.87–5.11)
RDW: 15.3 % (ref 11.5–15.5)
WBC: 7.4 10*3/uL (ref 4.0–10.5)
nRBC: 0 % (ref 0.0–0.2)

## 2020-07-28 LAB — PHOSPHORUS: Phosphorus: 3.9 mg/dL (ref 2.5–4.6)

## 2020-07-28 LAB — MAGNESIUM: Magnesium: 1.9 mg/dL (ref 1.7–2.4)

## 2020-07-28 MED ORDER — APIXABAN 5 MG PO TABS
5.0000 mg | ORAL_TABLET | Freq: Two times a day (BID) | ORAL | Status: DC
  Administered 2020-07-28 – 2020-07-31 (×7): 5 mg via ORAL
  Filled 2020-07-28 (×6): qty 1

## 2020-07-28 MED ORDER — NUTRISOURCE FIBER PO PACK
1.0000 | PACK | Freq: Three times a day (TID) | ORAL | Status: DC
  Administered 2020-07-29 – 2020-07-31 (×3): 1 via ORAL
  Filled 2020-07-28 (×15): qty 1

## 2020-07-28 MED ORDER — CLONAZEPAM 1 MG PO TABS
1.0000 mg | ORAL_TABLET | Freq: Two times a day (BID) | ORAL | Status: DC
  Administered 2020-07-28 – 2020-08-02 (×11): 1 mg via ORAL
  Filled 2020-07-28 (×10): qty 1

## 2020-07-28 MED ORDER — QUETIAPINE FUMARATE 25 MG PO TABS
25.0000 mg | ORAL_TABLET | Freq: Every day | ORAL | Status: DC
  Administered 2020-07-28 – 2020-08-10 (×14): 25 mg via ORAL
  Filled 2020-07-28 (×14): qty 1

## 2020-07-28 MED ORDER — KATE FARMS STANDARD 1.4 PO LIQD
325.0000 mL | Freq: Three times a day (TID) | ORAL | Status: DC
  Administered 2020-07-28: 325 mL via ORAL
  Filled 2020-07-28: qty 325

## 2020-07-28 MED ORDER — GLYCOPYRROLATE 1 MG PO TABS
1.0000 mg | ORAL_TABLET | Freq: Three times a day (TID) | ORAL | Status: DC
  Filled 2020-07-28 (×3): qty 1

## 2020-07-28 MED ORDER — GABAPENTIN 300 MG PO CAPS
300.0000 mg | ORAL_CAPSULE | Freq: Three times a day (TID) | ORAL | Status: DC
  Administered 2020-07-28 – 2020-08-11 (×43): 300 mg via ORAL
  Filled 2020-07-28 (×42): qty 1

## 2020-07-28 NOTE — Progress Notes (Signed)
PULMONARY PROGRESS NOTE    Name: Virginia [name] MRN: [id number] DOB: May 20, 1951     LOS: 52   SUBJECTIVE FINDINGS & SIGNIFICANT EVENTS    Patient description:  70 yo female with severe COPD admitted with acute on chronic hypoxic hypercapnic respiratory failure and acute encephalopathy secondary to narcotic use and AECOPD requiring mechanical intubation  Now s/p trach/PEG working on trach weaning.  On TC at present, anxious. Remains on precedex.  06/27/20- patient is improved.  Signed out to [facility] - Dr Mayford Knife for pick up in am.   06/29/20- patient is with respiratory distress, she has high volume phlegm from trache, ive suctioned out whats possible via [place] and messaged RT to assist.  RN was able to come in and evaluate patient and she was able to communicate appropriately.   07/07/20- patient is in bed resting. She is clinically better today then yesterday. Resting in bed in no distress.  07/08/20- Patient is lucid and awake.  Son is at bedside we discussed care plan.  CXR in am.   1/16-17/22- patient resting in bed comfortbably no acute events overnight.   07/11/20- patient had episode of mild respiratory distress due to voluminous phlegm expectorated via trache. This was suctioned by RN with recovery. Patient is clear to auscultation with occasional low pitch wheezes.  She is being optimized for d/c.   07/20/20- patient had episode of mild-moderate respiratory distress due to obstructed trache with inspissated phlegm.  She is on 7-8L/min humidifed HFnc.  She is with ENTEROBACTER AEROGENES on trache aspirate culture. ID is following and there is abscence of leucosytosis or fevers in several days.    07/21/20- patient is with increased O2 requirement this morning up to 10L/min.  She is actually clinically  better in appearance and lung auscultation is improved from yesterday.  She is smiling during interview.  She has PT today and may have increased O2 in preparation for therapy. Plan to continue current care plan.   07/23/20- patient is very frustrated this am due to wanting to be [facility] home. She is able to communicate via [employer] valve.  She shares there are multiple people at home that care for her and she wishes to be dcd home. I have asked her to please cooperate and be patient however she is aggitated and wishes to go home.    07/26/2020- patient is sitting up in chair, she is eating lunch without regurgitation.  Daughter is in room during evaluation.  Patient is vocalizing well with PMV.  She is requesting reversal of PEG tube and dc home. She remains on 8L/min [place].  She should have PT/OT/CPT  07/27/20- patient is stable however on 8-10L with trache collar.  She is eating regular food despite trach and has passed thorough speech evaluation.  She requests removal of her PEG tube and has not used it.  Have discussed with Dr. Lucianne Muss and we are working on accommodating patient's request.  She continues to work with PT and OT.  She requests to be discharged home after medical optimization.  07/28/20- patient is imporved, she is down to 5L/min.  She ambulated well today with PT.  She has requested reversal of PEG.      PHYSICAL EXAMINATION   Vital Signs: Temp:  [98.3 F (36.8 C)-98.7 F (37.1 C)] 98.7 F (37.1 C) (02/04 1447) Pulse Rate:  [62-97] 72 (02/04 1447) Resp:  [16-24] 16 (02/04 1447) BP: (92-131)/(62-86) 100/62 (02/04 1447) SpO2:  [94 %-100 %] 99 % (  02/04 1447) FiO2 (%):  [40 %] 40 % (02/04 1015)   FiO2 (%):  [40 %] 40 %   Constitutional: anxious frail woman lying in bed  Eyes: eomi, pupils equal Ears, nose, mouth, and throat: trach in place, minmal secretions Cardiovascular: RRR,  Ext warm Respiratory: diminished with prolonged expiratory phase, occasional accessory muscle  use Gastrointestinal: soft, abd wrapped after PEG, some mild tenderness at PEG insertion site MSK: muscle wasting Skin: No rashes, normal turgor, scattered bruising Neurologic: moves all 4 ext to command, weak Psychiatric: anxious, answering questions appropriately   Scheduled Meds: . acidophilus  1 capsule Oral TID  . apixaban  5 mg Oral BID  . arformoterol  15 mcg Nebulization BID  . clonazePAM  1 mg Oral BID  . feeding supplement  237 mL Oral BID BM  . feeding supplement ([employer] STANDARD 1.4)  325 mL Oral TID BM  . fentaNYL  1 patch Transdermal Q72H  . fiber  1 packet Oral TID  . gabapentin  300 mg Oral TID  . metoprolol tartrate  50 mg Oral BID  . nystatin   Topical TID  . pantoprazole  40 mg Oral QHS  . QUEtiapine  25 mg Oral QHS   Continuous Infusions:  PRN Meds:.acetaminophen (TYLENOL) oral liquid 160 mg/5 mL, diazepam, ipratropium-albuterol, labetalol, naphazoline-glycerin, ondansetron (ZOFRAN) IV    ASSESSMENT AND PLAN   Acute on chronic hypoxemic and hypercapnic respiratory failure thought secondary to polypharmacy long wean from ventilator due to muscular deconditioning now s/p tracheostomy.         -Heavy phlegm production - Glycopyrrolate tid 1mg -07/21/20  Dysphagia, moderate protein calorie malnutrition- now s/p PEG  Severe baseline COPD- complicated trach wean - continue COPD carepath  Muscular deconditioning-PT/OT/CPT  Chronic anxiety/depression  Metabolic encephalopathy/agitation- improving  - repeat tracheal aspirate ordered -ENTEROBACTER AEROGENES - Switched from standing duonebs from duonebs to brovana/yupelri - PT/OT up to chair - Continue standing clonazepam, use valium per tube for breakthrough - SLP for PMV trials - g off IV meds for rehab vs. SNF vs. LTACH vs home?    Patient chronically critically ill due to respiratory failure, metabolic encephalopathy Interventions to address this today weaning precedex, weaning vent Risk of  deterioration without these interventions is high    Patient would benefit from [facility]. *This note was dictated using voice recognition software/Dragon.  Despite best efforts to proofread, errors can occur which can change the meaning.  Any change was purely unintentional.     [facility], M.D.  Pulmonary & Critical Care Medicine  [facility]

## 2020-07-28 NOTE — TOC Progression Note (Signed)
Transition of Care [facility]) - Progression Note    Patient Details  Name: Virginia [name] MRN: [id number] Date of Birth: Jul 25, 1950  Transition of Care [facility] CM/SW Contact  Margarito Liner, LCSW Phone Number: 07/28/2020, 9:20 AM  Clinical Narrative:  Left voicemail for [facility] with [facility] to see if they take trach patients and could review patient's referral.     Barriers to Discharge: Continued Medical Work up  Expected Discharge Plan and Services   In-house Referral: Clinical Social Work,Hospice / Palliative Care   Post Acute Care Choice: Durable Medical Equipment (Oxygen 4L) Living arrangements for the past 2 months: Apartment                                       Social Determinants of Health (SDOH) Interventions    Readmission Risk Interventions No flowsheet data found.

## 2020-07-28 NOTE — Progress Notes (Signed)
PT Cancellation Note  Patient Details Name: Virginia [name] MRN: [id number] DOB: 09/25/1950   Cancelled Treatment:     PT attempt. 2nd attempt today. She was asleep upon arriving both times. Easily awakes but unwilling to participate due to L ankle pain. MD notified and X ray ordered.    Rushie Chestnut 07/28/2020, 2:16 PM

## 2020-07-28 NOTE — Progress Notes (Signed)
Physical Therapy Treatment Patient Details Name: Virginia [name] MRN: [id number] DOB: 08-02-1950 Today's Date: 07/28/2020    History of Present Illness 70 yo female with severe COPD admitted with acute on chronic hypoxic hypercapnic respiratory failure and acute encephalopathy secondary to narcotic use and AECOPD requiring mechanical intubation. Initially intubated from 12/14-12/16 and then again from 12/18-12/23. Now with + trach collar. Recent hospitalization from 12/10-12/12/21. RR secondary to slurred speech and AMS. Transferred to CCU on 1/20 on full vent support. RE-evaluation performed this date as medical status has improved.    PT Comments    Pt was long sitting in bed upon arriving with RN in room and pt sitting on 8L trach collar. Did discuss that x ray for ankle did not show any concerns or contraindication for mobility. She agrees to session and was motivated and pleasant throughout. Was able to ambulate 120 ft on 5L trach collar with sao2 >96% and HR <110 throughout. Pt much less anxious than last session with this therapist. She tolerated session well and is progressing well towards PT goals. At conclusion of session, pt was in bed with sao2 100% on 5 L trach collar. RN aware and pt comfortable with call bell in reach.    Follow Up Recommendations  Home health PT;Supervision/Assistance - 24 hour;Other (comment) (Once pt able to maintain home O2 + 24/7 supervision)     Equipment Recommendations  None recommended by PT    Recommendations for Other Services       Precautions / Restrictions Precautions Precautions: Fall Precaution Comments: trach (on 8L upon arriving. weaned to 5 L trach collar during session. sao2 >96%) Restrictions Weight Bearing Restrictions: No    Mobility  Bed Mobility Overal bed mobility: Modified Independent Bed Mobility: Supine to Sit;Sit to Supine Rolling: Supervision   Supine to sit: Supervision;HOB elevated Sit to supine: Supervision       Transfers Overall transfer level: Needs assistance Equipment used: Rolling walker (2 wheeled) Transfers: Sit to/from Stand Sit to Stand: Supervision            Ambulation/Gait Ambulation/Gait assistance: Min guard Gait Distance (Feet): 120 Feet Assistive device: Rolling walker (2 wheeled) Gait Pattern/deviations: Step-through pattern Gait velocity: decreased   General Gait Details: Pt was able to ambulate 120 ft with RW + on 5 L trach collar. sao2 >96% throughout session.     Balance Overall balance assessment: Needs assistance Sitting-balance support: Feet supported Sitting balance-Leahy Scale: Good Sitting balance - Comments: no balance deficits sitting EOB   Standing balance support: Bilateral upper extremity supported;During functional activity Standing balance-Leahy Scale: Fair Standing balance comment: no LOB with BUE support     Cognition Arousal/Alertness: Awake/alert Behavior During Therapy: WFL for tasks assessed/performed Overall Cognitive Status: Within Functional Limits for tasks assessed    General Comments: Pt was A and O x 4. She agrees to PT session with minimal encouragement. on 8L trach collar upon arriving. Was able to wean to 5L trach collar with sao2>96%             Pertinent Vitals/Pain Pain Assessment: No/denies pain Pain Score: 0-No pain           PT Goals (current goals can now be found in the care plan section) Acute Rehab PT Goals Patient Stated Goal: To return home safely Progress towards PT goals: Progressing toward goals    Frequency    Min 2X/week      PT Plan Current plan remains appropriate    Co-evaluation  PT goals addressed during session: Mobility/safety with mobility        AM-PAC PT "6 Clicks" Mobility   Outcome Measure  Help needed turning from your back to your side while in a flat bed without using bedrails?: None Help needed moving from lying on your back to sitting on the side of a flat bed  without using bedrails?: A Little Help needed moving to and from a bed to a chair (including a wheelchair)?: A Little Help needed standing up from a chair using your arms (e.g., wheelchair or bedside chair)?: A Little Help needed to walk in hospital room?: A Little Help needed climbing 3-5 steps with a railing? : A Little 6 Click Score: 19    End of Session Equipment Utilized During Treatment: Gait belt;Oxygen;Other (comment) (Pt was able to wean from 8L trach collar to 5 L trach collar during session) Activity Tolerance: Patient tolerated treatment well Patient left: in bed;with bed alarm set;with call bell/phone within reach Nurse Communication: Mobility status PT Visit Diagnosis: Unsteadiness on feet (R26.81);Muscle weakness (generalized) (M62.81);Difficulty in walking, not elsewhere classified (R26.2)     Time: 4259-5638 PT Time Calculation (min) (ACUTE ONLY): 30 min  Charges:  $Gait Training: 8-22 mins $Therapeutic Activity: 8-22 mins                     Jetta Lout PTA 07/28/20, 4:20 PM

## 2020-07-28 NOTE — Progress Notes (Signed)
PROGRESS NOTE    Virginia [name]  TDD:[id number] DOB: 1951-03-08 DOA: 06/06/2020 PCP: Marguarite Arbour, MD    Brief Narrative: This 70 years old female with severe COPD admitted with acute on chronic hypoxic and hypercapnic respiratory failure and acute encephalopathy secondary to narcotic use and advanced COPD requiring mechanical ventilation.  She was treated with steroids and IV antibiotics.  She could not be liberated from the ventilator so tracheostomy was performed and she has been receiving oxygen via trach collar. Patient developed worsening hypoxia on 1/15.  Chest x-ray shows bilateral lower lobe infiltrate consistent with aspiration pneumonia. She has completed antibiotics.  On 1/20 patient had a sudden onset of altered mental status,  ABG shows severe metabolic acidosis. Patient was transferred to ICU. PCCM pickup 1/24: Acute encephalopathy resolved.  CT head unremarkable for any acute stroke or bleeding.  Mental status has normalized without any focal deficit. COPD appears stable.  1/26 -> transfer to med-surg. Waiting for LTAC decision. 1/27-> LTAC declined.  Waiting for SNF placement. 1/28 -> oxygen requirement up to 10 L after PT/OT session.   1/29: > RRT called, O2 saturation dropped to 70s.  Found to have mucous plugging in the trach which was removed,  O2 saturation improved. 1/30- > Patient appears improved.  She is lying comfortably.  Oxygen requirement via trach 10 L. 1/31-> Given severe baseline COPD, would be difficult to wean trach.    2/1- > Difficult placement in LTAC.  TOC working to find place. [facility] in [place] accepts trachs. Awaiting response.  2/2: > Patient passed MBS,  started on dysphagia 2 diet.  She is tolerating well.  Patient is requesting to remove the PEG tube before discharge.  2/3: >  Reached out to GI Dr. Mia Creek about removing the PEG tube. Peg Tube be removed next week.    Assessment & Plan:   Active Problems:   AF  (paroxysmal atrial fibrillation) (HCC)   Anemia of chronic disease   COPD with acute lower respiratory infection (HCC)   Dysphagia   Respiratory failure (HCC)   COPD exacerbation (HCC)   Acute urinary retention   Acute metabolic encephalopathy   Acute on chronic respiratory failure with hypoxia and hypercapnia (HCC)   Aspiration pneumonia of both lower lobes due to gastric secretions (HCC)   Reactive thrombocytosis   Pressure injury of skin   Diarrhea   Acute on chronic hypoxic and hypercapnic respiratory failure: COPD exacerbation: Stable. Aspiration pneumonia :  Completed IV zosyn on 1/25. She is on trach collar, O2 sats 97% on 10 L, wean as able. She had  transient episodes of moderate respiratory distress several times due to blocked mucous/obstructed trach.  Now resolved Tracheal aspirate growing Enterobacter aerogenes Continue Glycopyrrolate for secretions. She is satting 97% on trach @ 8L. TOC Looking for facilities accepting patient with Trache and PEG.  AcuteMetabolic Encephalopathy:  >>>Resolved. Patient had a sudden onset of slurred speech and altered mental status. Differential including mucous plug, aspiration pneumoniaversus acute intracranial hemorrhage. Stat ABG showed pH of 7.0. Patient was not responding, code stroke called. CT scan ruled out intracranial hemorrhage. Patient does not have hypoglycemia. Patient was transferred to ICU. She is back to her baseline mental status. Stopping Narcotics one at a time (stopped norco on 1/26)   Severe metabolic acidosis>> resolved. Etiology still unclear, it appears to be sudden onset.  Diarrhea: >>> Resolved Nursing reports 2 large loose bowel movement on 1/28. Patient has been on antibiotics but  no leukocytosis or abdominal pain,   ID recommended no need for C. difficile testing. Could be tube feed related.  Could consider Imodium as needed  Dysphagia. Deconditioning Moderate protein calorie malnutrition.  On TFs and tolerating. Patient has passed modified barium swallow. Started on dysphagia 2 diet, tolerating well. Patient is requesting to remove the PEG tube before discharge. Reached out to GI Dr. Mia Creek , PEG tube needs to stay in place for six weeks before removal. She still has a week or so before it can be removed safely. 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 If removed too early she could require an emergent surgery.   Anxiety :  Continue Klonopin and valium for breakthrough.  Agitation  >> Improved. Remained on Precedex drip for agitation. She is off precedex,   Continue Klonopin and Valium.  Paroxysmal A. Fib:  Continue Eliquis,  EKG: normal sinus rhythm. Heart rate now controlled. Metoprolol 50 mg twice daily resumed.    DVT prophylaxis:  Lovenox Code Status: Full code. Family Communication: Spoke with daughter sherry. Disposition Plan:  Status is: Inpatient  Remains inpatient appropriate because:Inpatient level of care appropriate due to severity of illness   Dispo: The patient is from: Home              Anticipated d/c is to: SNF              Anticipated d/c date is: > 3 days              Patient currently is not medically stable to d/c. TOC team working on placement/SNF versus CIR   Difficult to place patient Yes   Consultants:   PCCM.  Procedures: Tracheostomy and PEG tube.  Antimicrobials:   Anti-infectives (From admission, onward)   Start     Dose/Rate Route Frequency Ordered Stop   07/14/20 1100  piperacillin-tazobactam (ZOSYN) IVPB 3.375 g        3.375 g 12.5 mL/hr over 240 Minutes Intravenous Every 8 hours 07/14/20 0946 07/18/20 2359   07/08/20 1145  amoxicillin-clavulanate (AUGMENTIN) 875-125 MG per tablet 1 tablet  Status:  Discontinued        1 tablet Per Tube Every 12 hours 07/08/20 1055 07/11/20 0803   07/06/20 1530  piperacillin-tazobactam (ZOSYN) IVPB 3.375 g  Status:   Discontinued        3.375 g 12.5 mL/hr over 240 Minutes Intravenous Every 8 hours 07/06/20 1433 07/08/20 1055   06/21/20 1200  ceFAZolin (ANCEF) IVPB 2g/100 mL premix       Note to Pharmacy: Please have at bedside. Will be administered one hour prior to procedure. Endo staff will instruct on when to start infusion.   2 g 200 mL/hr over 30 Minutes Intravenous  Once 06/21/20 0820 06/21/20 2030   06/07/20 1400  azithromycin (ZITHROMAX) 500 mg in sodium chloride 0.9 % 250 mL IVPB  Status:  Discontinued        500 mg 250 mL/hr over 60 Minutes Intravenous Every 24 hours 06/06/20 2030 06/06/20 2044   06/07/20 1400  azithromycin (ZITHROMAX) 500 mg in sodium chloride 0.9 % 250 mL IVPB  Status:  Discontinued        500 mg 250 mL/hr over 60 Minutes Intravenous Every 24 hours 06/06/20 2044 06/08/20 1031   06/06/20 1415  cefTRIAXone (ROCEPHIN) 2 g in sodium chloride 0.9 % 100 mL IVPB        2 g 200 mL/hr over  30 Minutes Intravenous  Once 06/06/20 1407 06/06/20 1514   06/06/20 1415  azithromycin (ZITHROMAX) 500 mg in sodium chloride 0.9 % 250 mL IVPB        500 mg 250 mL/hr over 60 Minutes Intravenous  Once 06/06/20 1407 06/06/20 1708      Subjective: Patient was seen and examined at bedside.  Overnight events noted.  Patient appears comfortable.   She uses [employer] valve to communicate.  Patient has been tolerating dysphagia 2 diet , requesting to remove the PEG tube.   She needs tracheostomy and PEG care.   Objective: Vitals:   07/28/20 0443 07/28/20 0751 07/28/20 1015 07/28/20 1134  BP: 92/62  131/84 107/85  Pulse: 62  97 75  Resp: 20   (!) 24  Temp: 98.4 F (36.9 C)  98.7 F (37.1 C) 98.7 F (37.1 C)  TempSrc: Oral  Oral Oral  SpO2: 97% 97% 95% 94%  Weight:      Height:        Intake/Output Summary (Last 24 hours) at 07/28/2020 1136 Last data filed at 07/27/2020 1352 Gross per 24 hour  Intake 240 ml  Output --  Net 240 ml   Filed Weights   07/16/20 0500 07/17/20 0426 07/18/20  0500  Weight: 56.4 kg 55.4 kg 54.9 kg    Examination:  General exam: Appears calm and comfortable , trach collar noted. Respiratory system: Clear to auscultation. Respiratory effort normal. Cardiovascular system: S1 & S2 heard, RRR. No JVD, murmurs, rubs, gallops or clicks. No pedal edema. Gastrointestinal system: Abdomen is nondistended, soft and nontender. No organomegaly or masses felt.  Normal bowel sounds heard.   PEG tube in place. Central nervous system: Alert and oriented. No focal neurological deficits. Extremities: Symmetric 5 x 5 power. Skin: No rashes, lesions or ulcers Psychiatry: Judgement and insight appear normal. Mood & affect appropriate.     Data Reviewed: I have personally reviewed following labs and imaging studies  CBC: Recent Labs  Lab 07/24/20 0528 07/25/20 0341 07/26/20 0349 07/27/20 0447 07/28/20 0509  WBC 6.8 7.6 8.2 9.1 7.4  HGB 10.0* 9.8* 9.5* 9.5* 9.8*  HCT 32.2* 32.5* 31.5* 32.6* 32.6*  MCV 92.0 92.9 94.3 96.4 95.0  PLT 369 373 344 357 345   Basic Metabolic Panel: Recent Labs  Lab 07/23/20 0419 07/24/20 0528 07/25/20 0341 07/26/20 0349 07/28/20 0509  NA 138 141 140 140 139  K 4.1 4.0 4.3 4.6 4.5  CL 95* 97* 94* 96* 90*  CO2 36* 35* 35* 36* 39*  GLUCOSE 91 81 86 87 84  BUN 16 18 21 19 16   CREATININE 0.54 0.61 0.66 0.57 0.66  CALCIUM 9.4 9.5 9.2 8.9 9.5  MG 2.0 2.0  --  2.0 1.9  PHOS 4.4 5.2*  --  4.2 3.9   GFR: Estimated Creatinine Clearance: 52.5 mL/min (by C-G formula based on SCr of 0.66 mg/dL). Liver Function Tests: No results for input(s): AST, ALT, ALKPHOS, BILITOT, PROT, ALBUMIN in the last 168 hours. No results for input(s): LIPASE, AMYLASE in the last 168 hours. No results for input(s): AMMONIA in the last 168 hours. Coagulation Profile: No results for input(s): INR, PROTIME in the last 168 hours. Cardiac Enzymes: No results for input(s): CKTOTAL, CKMB, CKMBINDEX, TROPONINI in the last 168 hours. BNP (last 3  results) No results for input(s): PROBNP in the last 8760 hours. HbA1C: No results for input(s): HGBA1C in the last 72 hours. CBG: Recent Labs  Lab 07/21/20 1611 07/23/20 2112  GLUCAP 88 110*   Lipid Profile: No results for input(s): CHOL, HDL, LDLCALC, TRIG, CHOLHDL, LDLDIRECT in the last 72 hours. Thyroid Function Tests: No results for input(s): TSH, T4TOTAL, FREET4, T3FREE, THYROIDAB in the last 72 hours. Anemia Panel: No results for input(s): VITAMINB12, FOLATE, FERRITIN, TIBC, IRON, RETICCTPCT in the last 72 hours. Sepsis Labs: No results for input(s): PROCALCITON, LATICACIDVEN in the last 168 hours.  No results found for this or any previous visit (from the past 240 hour(s)).  Radiology Studies: No results found.  Scheduled Meds: . acidophilus  1 capsule Oral TID  . apixaban  5 mg Oral BID  . arformoterol  15 mcg Nebulization BID  . clonazePAM  1 mg Oral BID  . feeding supplement  237 mL Oral BID BM  . feeding supplement ([employer] STANDARD 1.4)  325 mL Oral TID BM  . fentaNYL  1 patch Transdermal Q72H  . fiber  1 packet Oral TID  . gabapentin  300 mg Oral TID  . glycopyrrolate  1 mg Oral TID  . mouth rinse  15 mL Mouth Rinse 10 times per day  . metoprolol tartrate  50 mg Oral BID  . nystatin   Topical TID  . pantoprazole  40 mg Oral QHS  . QUEtiapine  25 mg Oral QHS   Continuous Infusions:   LOS: 52 days    Time spent: 25 mins.    Cipriano Bunker, MD [facility]   If 7PM-7AM, please contact night-coverage

## 2020-07-29 DIAGNOSIS — J9622 Acute and chronic respiratory failure with hypercapnia: Secondary | ICD-10-CM | POA: Diagnosis not present

## 2020-07-29 DIAGNOSIS — G9341 Metabolic encephalopathy: Secondary | ICD-10-CM | POA: Diagnosis not present

## 2020-07-29 DIAGNOSIS — J9621 Acute and chronic respiratory failure with hypoxia: Secondary | ICD-10-CM | POA: Diagnosis not present

## 2020-07-29 NOTE — Progress Notes (Signed)
PULMONARY PROGRESS NOTE    Name: Virginia [name] MRN: [id number] DOB: [birth year]     LOS: 53   SUBJECTIVE FINDINGS & SIGNIFICANT EVENTS    Patient description:  70 yo female with severe COPD admitted with acute on chronic hypoxic hypercapnic respiratory failure and acute encephalopathy secondary to narcotic use and AECOPD requiring mechanical intubation  Now s/p trach/PEG working on trach weaning.  On TC at present, anxious. Remains on precedex.  06/27/20- patient is improved.  Signed out to [facility] - Dr Mayford Knife for pick up in am.   06/29/20- patient is with respiratory distress, she has high volume phlegm from trache, ive suctioned out whats possible via [place] and messaged RT to assist.  RN was able to come in and evaluate patient and she was able to communicate appropriately.   07/07/20- patient is in bed resting. She is clinically better today then yesterday. Resting in bed in no distress.  07/08/20- Patient is lucid and awake.  Son is at bedside we discussed care plan.  CXR in am.   1/16-17/22- patient resting in bed comfortbably no acute events overnight.   07/11/20- patient had episode of mild respiratory distress due to voluminous phlegm expectorated via trache. This was suctioned by RN with recovery. Patient is clear to auscultation with occasional low pitch wheezes.  She is being optimized for d/c.   07/20/20- patient had episode of mild-moderate respiratory distress due to obstructed trache with inspissated phlegm.  She is on 7-8L/min humidifed HFnc.  She is with ENTEROBACTER AEROGENES on trache aspirate culture. ID is following and there is abscence of leucosytosis or fevers in several days.    07/21/20- patient is with increased O2 requirement this morning up to 10L/min.  She is actually clinically  better in appearance and lung auscultation is improved from yesterday.  She is smiling during interview.  She has PT today and may have increased O2 in preparation for therapy. Plan to continue current care plan.   07/23/20- patient is very frustrated this am due to wanting to be [facility] home. She is able to communicate via [employer] valve.  She shares there are multiple people at home that care for her and she wishes to be dcd home. I have asked her to please cooperate and be patient however she is aggitated and wishes to go home.    07/26/2020- patient is sitting up in chair, she is eating lunch without regurgitation.  Daughter is in room during evaluation.  Patient is vocalizing well with PMV.  She is requesting reversal of PEG tube and dc home. She remains on 8L/min [place].  She should have PT/OT/CPT  07/27/20- patient is stable however on 8-10L with trache collar.  She is eating regular food despite trach and has passed thorough speech evaluation.  She requests removal of her PEG tube and has not used it.  Have discussed with Dr. Lucianne Muss and we are working on accommodating patient's request.  She continues to work with PT and OT.  She requests to be discharged home after medical optimization.  07/28/20- patient is imporved, she is down to 5L/min.  She ambulated well today with PT.  She has requested reversal of PEG.     07/29/2020- patient improved, O2 weaned to 5L/min, worked with PT/OT today.  She is improving and vocalizing well, feeling more confident and stronger.  Wants to go home after PEG removed/reversed.   PHYSICAL EXAMINATION   Vital Signs: Temp:  [98 F (36.7 C)-99 F (37.2  C)] 98 F (36.7 C) (02/05 1057) Pulse Rate:  [68-87] 68 (02/05 1057) Resp:  [16-20] 18 (02/05 1057) BP: (100-122)/(62-89) 100/70 (02/05 1057) SpO2:  [95 %-100 %] 99 % (02/05 1057) FiO2 (%):  [35 %-40 %] 35 % (02/05 0757)   FiO2 (%):  [35 %-40 %] 35 %   Constitutional: anxious frail woman lying in bed  Eyes: eomi,  pupils equal Ears, nose, mouth, and throat: trach in place, minmal secretions Cardiovascular: RRR,  Ext warm Respiratory: diminished with prolonged expiratory phase, occasional accessory muscle use Gastrointestinal: soft, abd wrapped after PEG, some mild tenderness at PEG insertion site MSK: muscle wasting Skin: No rashes, normal turgor, scattered bruising Neurologic: moves all 4 ext to command, weak Psychiatric: anxious, answering questions appropriately   Scheduled Meds: . acidophilus  1 capsule Oral TID  . apixaban  5 mg Oral BID  . arformoterol  15 mcg Nebulization BID  . clonazePAM  1 mg Oral BID  . feeding supplement  237 mL Oral BID BM  . fentaNYL  1 patch Transdermal Q72H  . fiber  1 packet Oral TID  . gabapentin  300 mg Oral TID  . metoprolol tartrate  50 mg Oral BID  . nystatin   Topical TID  . pantoprazole  40 mg Oral QHS  . QUEtiapine  25 mg Oral QHS   Continuous Infusions:  PRN Meds:.acetaminophen (TYLENOL) oral liquid 160 mg/5 mL, diazepam, ipratropium-albuterol, labetalol, naphazoline-glycerin, ondansetron (ZOFRAN) IV    ASSESSMENT AND PLAN   Acute on chronic hypoxemic and hypercapnic respiratory failure thought secondary to polypharmacy long wean from ventilator due to muscular deconditioning now s/p tracheostomy.         -Heavy phlegm production - Glycopyrrolate tid 1mg -07/21/20  Dysphagia, moderate protein calorie malnutrition- now s/p PEG  Severe baseline COPD- complicated trach wean - continue COPD carepath  Muscular deconditioning-PT/OT/CPT  Chronic anxiety/depression  Metabolic encephalopathy/agitation- improving  - repeat tracheal aspirate ordered -ENTEROBACTER AEROGENES - Switched from standing duonebs from duonebs to brovana/yupelri - PT/OT up to chair - Continue standing clonazepam, use valium per tube for breakthrough - SLP for PMV trials - g off IV meds for rehab vs. SNF vs. LTACH vs home?    Patient chronically critically ill due to  respiratory failure, metabolic encephalopathy Interventions to address this today weaning precedex, weaning vent Risk of deterioration without these interventions is high    Patient would benefit from [facility]. *This note was dictated using voice recognition software/Dragon.  Despite best efforts to proofread, errors can occur which can change the meaning.  Any change was purely unintentional.     [facility], M.D.  Pulmonary & Critical Care Medicine  [facility]

## 2020-07-29 NOTE — Progress Notes (Signed)
PROGRESS NOTE    Virginia [name]  RFF:[id number] DOB: 1951/04/17 DOA: 06/06/2020 PCP: Marguarite Arbour, MD    Brief Narrative: This 70 years old female with severe COPD admitted with acute on chronic hypoxic and hypercapnic respiratory failure and acute encephalopathy secondary to narcotic use and advanced COPD requiring mechanical ventilation. She was treated with steroids and IV antibiotics in the ICU . She could not be liberated from the ventilator so tracheostomy was performed and she has been receiving oxygen via trach collar.  She had prolonged ICU course.  PCCM pick up 1/14. Patient developed worsening hypoxia on 1/15.  Chest x-ray shows bilateral lower lobe infiltrate consistent with aspiration pneumonia. She was started and has completed antibiotics.   On 1/20 : patient had a sudden onset of altered mental status,  ABG shows severe metabolic acidosis. Patient was transferred to ICU.  PCCM pickup 1/24: Acute encephalopathy resolved.  CT head unremarkable for any acute stroke or bleeding.  Mental status has normalized without any focal deficit. COPD appears stable.  1/26 -> transfer to med-surg. Waiting for LTAC decision. 1/27-> LTAC declined.  Waiting for SNF placement. 1/28 -> Oxygen requirement up to 10 L after PT/OT session.   1/29: > RRT called, O2 saturation dropped to 70s.  Found to have mucous plugging in the trach which was removed,  O2 saturation improved. 1/30- > Patient appears improved.  She is lying comfortably.  Oxygen requirement via trach 10 L. 1/31-> Given severe baseline COPD, would be difficult to wean trach.    2/1- > Difficult placement in LTAC.  TOC working to find place. [facility] in [place] accepts trachs. Awaiting response.  2/2: > Patient passed MBS,  Started on dysphagia 2 diet.  She is tolerating well.  Patient is requesting to remove the PEG tube before discharge.  2/3: >  Reached out to GI Dr. Mia Creek about removing the PEG tube. Peg  Tube be removed next week.  2/4>    Oxygen requirement vial Trach @ 8L. Participated in Physical Therapy, HHPT recommended.    Assessment & Plan:   Active Problems:   AF (paroxysmal atrial fibrillation) (HCC)   Anemia of chronic disease   COPD with acute lower respiratory infection (HCC)   Dysphagia   Respiratory failure (HCC)   COPD exacerbation (HCC)   Acute urinary retention   Acute metabolic encephalopathy   Acute on chronic respiratory failure with hypoxia and hypercapnia (HCC)   Aspiration pneumonia of both lower lobes due to gastric secretions (HCC)   Reactive thrombocytosis   Pressure injury of skin   Diarrhea   Acute on chronic hypoxic and hypercapnic respiratory failure: COPD exacerbation: Stable. Aspiration pneumonia :  Completed IV zosyn on 1/25. She is on trach collar, O2 sats 97% on 10 L, wean as able. She had  transient episodes of moderate respiratory distress several times due to blocked mucous/obstructed trach.  Now resolved Tracheal aspirate growing Enterobacter aerogenes Continue Glycopyrrolate for secretions. She is satting 97% on trach @ 8L. TOC Looking for facilities accepting patient with Trache and PEG.  AcuteMetabolic Encephalopathy:  >>>Resolved. Patient had a sudden onset of slurred speech and altered mental status. Differential including mucous plug, aspiration pneumoniaversus acute intracranial hemorrhage. Stat ABG showed pH of 7.0. Patient was not responding, code stroke called. CT scan ruled out intracranial hemorrhage. Patient does not have hypoglycemia. Patient was transferred to ICU. She is back to her baseline mental status. Stopping Narcotics one at a time (stopped norco  on 1/26)   Severe metabolic acidosis>> resolved. Etiology still unclear, it appears to be sudden onset.  Diarrhea: >>> Resolved Nursing reported 2 large loose bowel movement on 1/28. Patient has been on antibiotics but no leukocytosis or abdominal pain,   ID  recommended no need for C. difficile testing. Could be tube feed related.  Could consider Imodium as needed  Dysphagia. Deconditioning Moderate protein calorie malnutrition. On TFs and tolerating. Patient has passed modified barium swallow. Started on dysphagia 2 diet, tolerating well. Patient is requesting to remove the PEG tube before discharge. Reached out to GI Dr. Mia Creek , PEG tube needs to stay in place for six weeks before removal. She still has a week or so before it can be removed safely. 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 If removed too early she could require an emergent surgery.   Anxiety :  Continue Klonopin and valium for breakthrough.  Agitation  >> Improved. Remained on Precedex drip for agitation. She is off precedex,   Continue Klonopin and Valium.  Paroxysmal A. Fib:  Continue Eliquis,  EKG: normal sinus rhythm. Heart rate now controlled. Metoprolol 50 mg twice daily resumed.    DVT prophylaxis:  Lovenox Code Status: Full code. Family Communication: Spoke with daughter sherry. Disposition Plan:  Status is: Inpatient  Remains inpatient appropriate because:Inpatient level of care appropriate due to severity of illness   Dispo: The patient is from: Home              Anticipated d/c is to: SNF vs HHPT.              Anticipated d/c date is: > 3 days              Patient currently is not medically stable to d/c. TOC team working on placement/SNF versus CIR   Difficult to place patient Yes   Consultants:   PCCM.  Procedures: Tracheostomy and PEG tube.  Antimicrobials:   Anti-infectives (From admission, onward)   Start     Dose/Rate Route Frequency Ordered Stop   07/14/20 1100  piperacillin-tazobactam (ZOSYN) IVPB 3.375 g        3.375 g 12.5 mL/hr over 240 Minutes Intravenous Every 8 hours 07/14/20 0946 07/18/20 2359   07/08/20 1145  amoxicillin-clavulanate (AUGMENTIN) 875-125 MG  per tablet 1 tablet  Status:  Discontinued        1 tablet Per Tube Every 12 hours 07/08/20 1055 07/11/20 0803   07/06/20 1530  piperacillin-tazobactam (ZOSYN) IVPB 3.375 g  Status:  Discontinued        3.375 g 12.5 mL/hr over 240 Minutes Intravenous Every 8 hours 07/06/20 1433 07/08/20 1055   06/21/20 1200  ceFAZolin (ANCEF) IVPB 2g/100 mL premix       Note to Pharmacy: Please have at bedside. Will be administered one hour prior to procedure. Endo staff will instruct on when to start infusion.   2 g 200 mL/hr over 30 Minutes Intravenous  Once 06/21/20 0820 06/21/20 2030   06/07/20 1400  azithromycin (ZITHROMAX) 500 mg in sodium chloride 0.9 % 250 mL IVPB  Status:  Discontinued        500 mg 250 mL/hr over 60 Minutes Intravenous Every 24 hours 06/06/20 2030 06/06/20 2044   06/07/20 1400  azithromycin (ZITHROMAX) 500 mg in sodium chloride 0.9 % 250 mL IVPB  Status:  Discontinued        500 mg 250 mL/hr over 60  Minutes Intravenous Every 24 hours 06/06/20 2044 06/08/20 1031   06/06/20 1415  cefTRIAXone (ROCEPHIN) 2 g in sodium chloride 0.9 % 100 mL IVPB        2 g 200 mL/hr over 30 Minutes Intravenous  Once 06/06/20 1407 06/06/20 1514   06/06/20 1415  azithromycin (ZITHROMAX) 500 mg in sodium chloride 0.9 % 250 mL IVPB        500 mg 250 mL/hr over 60 Minutes Intravenous  Once 06/06/20 1407 06/06/20 1708      Subjective: Patient was seen and examined at bedside.  Overnight events noted.  Patient appears comfortable.   She uses [employer] valve to communicate.  Patient has been tolerating dysphagia 2 diet , requesting to remove the PEG tube.   She needs tracheostomy care. She has participated in Physical therapy yesterday, feels improved.   Objective: Vitals:   07/28/20 2013 07/29/20 0353 07/29/20 0955 07/29/20 1057  BP:  114/79 122/78 100/70  Pulse:  74 84 68  Resp:  20  18  Temp:  98.1 F (36.7 C) 99 F (37.2 C) 98 F (36.7 C)  TempSrc:  Oral Oral Oral  SpO2: 95% 100% 100% 99%   Weight:      Height:        Intake/Output Summary (Last 24 hours) at 07/29/2020 1140 Last data filed at 07/29/2020 1017 Gross per 24 hour  Intake 360 ml  Output --  Net 360 ml   Filed Weights   07/16/20 0500 07/17/20 0426 07/18/20 0500  Weight: 56.4 kg 55.4 kg 54.9 kg    Examination:  General exam: Appears calm and comfortable , trach collar noted. Respiratory system: Clear to auscultation. Respiratory effort normal. Cardiovascular system: S1 & S2 heard, RRR. No JVD, murmurs, rubs, gallops or clicks. No pedal edema. Gastrointestinal system: Abdomen is nondistended, soft and nontender. No organomegaly or masses felt.  Normal bowel sounds heard.   PEG tube in place. Central nervous system: Alert and oriented. No focal neurological deficits. Extremities: Symmetric 5 x 5 power. Skin: No rashes, lesions or ulcers Psychiatry: Judgement and insight appear normal. Mood & affect appropriate.     Data Reviewed: I have personally reviewed following labs and imaging studies  CBC: Recent Labs  Lab 07/24/20 0528 07/25/20 0341 07/26/20 0349 07/27/20 0447 07/28/20 0509  WBC 6.8 7.6 8.2 9.1 7.4  HGB 10.0* 9.8* 9.5* 9.5* 9.8*  HCT 32.2* 32.5* 31.5* 32.6* 32.6*  MCV 92.0 92.9 94.3 96.4 95.0  PLT 369 373 344 357 345   Basic Metabolic Panel: Recent Labs  Lab 07/23/20 0419 07/24/20 0528 07/25/20 0341 07/26/20 0349 07/28/20 0509  NA 138 141 140 140 139  K 4.1 4.0 4.3 4.6 4.5  CL 95* 97* 94* 96* 90*  CO2 36* 35* 35* 36* 39*  GLUCOSE 91 81 86 87 84  BUN 16 18 21 19 16   CREATININE 0.54 0.61 0.66 0.57 0.66  CALCIUM 9.4 9.5 9.2 8.9 9.5  MG 2.0 2.0  --  2.0 1.9  PHOS 4.4 5.2*  --  4.2 3.9   GFR: Estimated Creatinine Clearance: 52.5 mL/min (by C-G formula based on SCr of 0.66 mg/dL). Liver Function Tests: No results for input(s): AST, ALT, ALKPHOS, BILITOT, PROT, ALBUMIN in the last 168 hours. No results for input(s): LIPASE, AMYLASE in the last 168 hours. No results for  input(s): AMMONIA in the last 168 hours. Coagulation Profile: No results for input(s): INR, PROTIME in the last 168 hours. Cardiac Enzymes: No results for input(s):  CKTOTAL, CKMB, CKMBINDEX, TROPONINI in the last 168 hours. BNP (last 3 results) No results for input(s): PROBNP in the last 8760 hours. HbA1C: No results for input(s): HGBA1C in the last 72 hours. CBG: Recent Labs  Lab 07/23/20 2112  GLUCAP 110*   Lipid Profile: No results for input(s): CHOL, HDL, LDLCALC, TRIG, CHOLHDL, LDLDIRECT in the last 72 hours. Thyroid Function Tests: No results for input(s): TSH, T4TOTAL, FREET4, T3FREE, THYROIDAB in the last 72 hours. Anemia Panel: No results for input(s): VITAMINB12, FOLATE, FERRITIN, TIBC, IRON, RETICCTPCT in the last 72 hours. Sepsis Labs: No results for input(s): PROCALCITON, LATICACIDVEN in the last 168 hours.  No results found for this or any previous visit (from the past 240 hour(s)).  Radiology Studies: DG Ankle 2 Views Left  Result Date: 07/28/2020 CLINICAL DATA:  Left ankle pain, no reported injury. EXAM: LEFT ANKLE - 2 VIEW COMPARISON:  None. FINDINGS: Patient's sock creates artifact superimposed with the osseous structures. No acute osseous or joint abnormality. IMPRESSION: No acute osseous or joint abnormality. Electronically Signed   By: Leanna Battles M.D.   On: 07/28/2020 15:10    Scheduled Meds: . acidophilus  1 capsule Oral TID  . apixaban  5 mg Oral BID  . arformoterol  15 mcg Nebulization BID  . clonazePAM  1 mg Oral BID  . feeding supplement  237 mL Oral BID BM  . fentaNYL  1 patch Transdermal Q72H  . fiber  1 packet Oral TID  . gabapentin  300 mg Oral TID  . metoprolol tartrate  50 mg Oral BID  . nystatin   Topical TID  . pantoprazole  40 mg Oral QHS  . QUEtiapine  25 mg Oral QHS   Continuous Infusions:   LOS: 53 days    Time spent: 25 mins.    Cipriano Bunker, MD [facility]   If 7PM-7AM, please contact night-coverage

## 2020-07-29 NOTE — Progress Notes (Signed)
Occupational Therapy Treatment Patient Details Name: Virginia [name] MRN: [id number] DOB: 05/05/1951 Today's Date: 07/29/2020    History of present illness 70 yo female with severe COPD admitted with acute on chronic hypoxic hypercapnic respiratory failure and acute encephalopathy secondary to narcotic use and AECOPD requiring mechanical intubation. Initially intubated from 12/14-12/16 and then again from 12/18-12/23. Now with + trach collar. Recent hospitalization from 12/10-12/12/21. RR secondary to slurred speech and AMS. Transferred to CCU on 1/20 on full vent support. RE-evaluation performed this date as medical status has improved.   OT comments  Pt seen for OT treatment this date to f/u re: safety with ADLs/ADL mobility. Pt wanting to perform fxl mobility this date for exercise to improve her tolerance for fxl HH distances. OT engages pt in toileting task for which she requires SBA/SUPV for ambulation and transfer to [facility] with RW. Pt able to perform anterior and posterior peri care with SBA, using sit to stand method with RW for balance/support and use of BSC arm rests to stabilize. OT engages pt in fxl mobility around nursing station with RW with SBA/SUPV. Pt on 10L trach collar when OT presents. Pt's sats noted to be perfect 100%. OT titrates to 8L while pt at rest and she continues to demo 100% spO2, OT titrates to 5L and pt is still able to sustain 100% spO2. With activity, pt's pleth questionable as she holds RW with finger designated for spO2 reading, pt does potentially de-sat to 84-85% when OT tries to encourage that pt wear surgical mask in context of global pandemic. OT titrates pt up to 10L again and pt able to perform fxl mobility sustaining 99%, OT again adjusts pt to 8L trach collar, and she sustains >95% with light activity/fxl mobility. Pt feels she cannot tolerate the mask and OT/pt compromise on containing fxl mobility to just outside of patient's room to decreased possibility of  transmission of illnesseses. RN notified and agreeable to leaving pt at 5L at rest. Will continue to follow acutely. Pt continues to progress with occupations of daily living. D/t pt progress along with reasonable patient wishes, will update her d/c recommendation to [facility] with 24/7 SUPV for safety.    Follow Up Recommendations  Home health OT;Supervision/Assistance - 24 hour    Equipment Recommendations  3 in 1 bedside commode;Tub/shower seat;Other (comment) (2ww, w/c or transport chair)    Recommendations for Other Services      Precautions / Restrictions Precautions Precautions: Fall Precaution Comments: trach Restrictions Weight Bearing Restrictions: No       Mobility Bed Mobility Overal bed mobility: Modified Independent Bed Mobility: Supine to Sit;Sit to Supine              Transfers Overall transfer level: Needs assistance Equipment used: Rolling walker (2 wheeled) Transfers: Sit to/from Stand Sit to Stand: Supervision         General transfer comment: increased time, close SBA, one cue for hand placement to push to stand. Pt demos progress with powering up/concentric control.    Balance Overall balance assessment: Needs assistance Sitting-balance support: Feet supported Sitting balance-Leahy Scale: Good     Standing balance support: Bilateral upper extremity supported;During functional activity Standing balance-Leahy Scale: Fair Standing balance comment: requires UE support, partially d/t balance, partially d/t limited fxl activity tolerance.                           ADL either performed or assessed with clinical judgement  ADL Overall ADL's : Needs assistance/impaired     Grooming: Wash/dry hands;Set up;Sitting                   Toilet Transfer: Supervision/safety;Min Social research officer, government Details (indicate cue type and reason): increased time, MIN verbal cues for hand placement, overall pt demos  progress Toileting- Clothing Manipulation and Hygiene: Supervision/safety;Sit to/from stand Toileting - Clothing Manipulation Details (indicate cue type and reason): pt pushes from toilet arm rests to stabilize while performing standing anterior and posterior peri care. OT with close SBA for steadying if needed, but pt ultimately able to perform task without physical assistance. Is noted to be bracing back of LEs on commode     Functional mobility during ADLs: Supervision/safety;Rolling walker (close SBA to walk ~160' around nursing unit with RW for support.)       Vision Patient Visual Report: No change from baseline     Perception     Praxis      Cognition Arousal/Alertness: Awake/alert Behavior During Therapy: WFL for tasks assessed/performed Overall Cognitive Status: Within Functional Limits for tasks assessed                                          Exercises Other Exercises Other Exercises: OT faciltiates pt participation in fxl mobility and toileting this session with pt tolerating well and only requires MIN verbal cues occasionally for hand placement with use of RW for ADL transfers.   Shoulder Instructions       General Comments Pt on 10L trach collar when OT presents. Pt's sats noted to be perfect 100%. OT titrates to 8L while pt at rest and she continues to demo 100% spO2, OT titrates to 5L and pt is still able to sustain 100% spO2. With activity, pt's pleth questionable as she holds RW with finger designated for spO2 reading, pt does potentially de-sat to 84-85% when OT tries to encourage that pt wear surgical mask in context of global pandemic. OT titrates pt up to 10L again and pt able to perform fxl mobility sustaining 99%, OT again adjusts pt to 8L trach collar, and she sustains >95% with light activity/fxl mobility. Pt feels she cannot tolerate the mask and OT/pt compromise on containing fxl mobility to just outside of patient's room to decreased  possibility of transmission of illnesseses. RN notified and agreeable to leaving pt at 5L at rest.    Pertinent Vitals/ Pain       Pain Assessment: No/denies pain  Home Living                                          Prior Functioning/Environment              Frequency  Min 1X/week        Progress Toward Goals  OT Goals(current goals can now be found in the care plan section)  Progress towards OT goals: Progressing toward goals  Acute Rehab OT Goals Patient Stated Goal: To return home safely OT Goal Formulation: With patient Time For Goal Achievement: 08/10/20 Potential to Achieve Goals: Good  Plan Discharge plan remains appropriate;Frequency remains appropriate    Co-evaluation                 AM-PAC OT "6 Clicks" Daily Activity  Outcome Measure   Help from another person eating meals?: None Help from another person taking care of personal grooming?: A Little Help from another person toileting, which includes using toliet, bedpan, or urinal?: A Little Help from another person bathing (including washing, rinsing, drying)?: A Little Help from another person to put on and taking off regular upper body clothing?: A Little Help from another person to put on and taking off regular lower body clothing?: A Little 6 Click Score: 19    End of Session Equipment Utilized During Treatment: Oxygen;Gait belt;Rolling walker  OT Visit Diagnosis: Other abnormalities of gait and mobility (R26.89);Muscle weakness (generalized) (M62.81)   Activity Tolerance Patient tolerated treatment well   Patient Left in bed;with call bell/phone within reach;with bed alarm set   Nurse Communication Mobility status;Other (comment) (o2)        Time: 2025-4270 OT Time Calculation (min): 54 min  Charges: OT General Charges $OT Visit: 1 Visit OT Treatments $Self Care/Home Management : 23-37 mins $Therapeutic Activity: 23-37 mins  Rejeana Brock, MS,  OTR/L ascom [phone] 07/29/20, 2:29 PM

## 2020-07-30 DIAGNOSIS — I480 Paroxysmal atrial fibrillation: Secondary | ICD-10-CM | POA: Diagnosis not present

## 2020-07-30 DIAGNOSIS — J9621 Acute and chronic respiratory failure with hypoxia: Secondary | ICD-10-CM | POA: Diagnosis not present

## 2020-07-30 DIAGNOSIS — G9341 Metabolic encephalopathy: Secondary | ICD-10-CM | POA: Diagnosis not present

## 2020-07-30 DIAGNOSIS — J9622 Acute and chronic respiratory failure with hypercapnia: Secondary | ICD-10-CM | POA: Diagnosis not present

## 2020-07-30 LAB — CBC
HCT: 31.4 % — ABNORMAL LOW (ref 36.0–46.0)
Hemoglobin: 9.7 g/dL — ABNORMAL LOW (ref 12.0–15.0)
MCH: 27.8 pg (ref 26.0–34.0)
MCHC: 30.9 g/dL (ref 30.0–36.0)
MCV: 90 fL (ref 80.0–100.0)
Platelets: 389 10*3/uL (ref 150–400)
RBC: 3.49 MIL/uL — ABNORMAL LOW (ref 3.87–5.11)
RDW: 15.5 % (ref 11.5–15.5)
WBC: 6.9 10*3/uL (ref 4.0–10.5)
nRBC: 0 % (ref 0.0–0.2)

## 2020-07-30 NOTE — Progress Notes (Signed)
PULMONARY PROGRESS NOTE    Name: Virginia [name] MRN: [id number] DOB: [birth year]     LOS: 54   SUBJECTIVE FINDINGS & SIGNIFICANT EVENTS    Patient description:  70 yo female with severe COPD admitted with acute on chronic hypoxic hypercapnic respiratory failure and acute encephalopathy secondary to narcotic use and AECOPD requiring mechanical intubation  Now s/p trach/PEG working on trach weaning.  On TC at present, anxious. Remains on precedex.  06/27/20- patient is improved.  Signed out to [facility] - Dr Mayford Knife for pick up in am.   06/29/20- patient is with respiratory distress, she has high volume phlegm from trache, ive suctioned out whats possible via [place] and messaged RT to assist.  RN was able to come in and evaluate patient and she was able to communicate appropriately.   07/07/20- patient is in bed resting. She is clinically better today then yesterday. Resting in bed in no distress.  07/08/20- Patient is lucid and awake.  Son is at bedside we discussed care plan.  CXR in am.   1/16-17/22- patient resting in bed comfortbably no acute events overnight.   07/11/20- patient had episode of mild respiratory distress due to voluminous phlegm expectorated via trache. This was suctioned by RN with recovery. Patient is clear to auscultation with occasional low pitch wheezes.  She is being optimized for d/c.   07/20/20- patient had episode of mild-moderate respiratory distress due to obstructed trache with inspissated phlegm.  She is on 7-8L/min humidifed HFnc.  She is with ENTEROBACTER AEROGENES on trache aspirate culture. ID is following and there is abscence of leucosytosis or fevers in several days.    07/21/20- patient is with increased O2 requirement this morning up to 10L/min.  She is actually clinically  better in appearance and lung auscultation is improved from yesterday.  She is smiling during interview.  She has PT today and may have increased O2 in preparation for therapy. Plan to continue current care plan.   07/23/20- patient is very frustrated this am due to wanting to be [facility] home. She is able to communicate via [employer] valve.  She shares there are multiple people at home that care for her and she wishes to be dcd home. I have asked her to please cooperate and be patient however she is aggitated and wishes to go home.    07/26/2020- patient is sitting up in chair, she is eating lunch without regurgitation.  Daughter is in room during evaluation.  Patient is vocalizing well with PMV.  She is requesting reversal of PEG tube and dc home. She remains on 8L/min [place].  She should have PT/OT/CPT  07/27/20- patient is stable however on 8-10L with trache collar.  She is eating regular food despite trach and has passed thorough speech evaluation.  She requests removal of her PEG tube and has not used it.  Have discussed with Dr. Lucianne Muss and we are working on accommodating patient's request.  She continues to work with PT and OT.  She requests to be discharged home after medical optimization.  07/28/20- patient is imporved, she is down to 5L/min.  She ambulated well today with PT.  She has requested reversal of PEG.     07/29/2020- patient improved, O2 weaned to 5L/min, worked with PT/OT today.  She is improving and vocalizing well, feeling more confident and stronger.  Wants to go home after PEG removed/reversed.   07/30/2020- patient resting in bed in no distress.  Vitals are stable. No overnight  events.   PHYSICAL EXAMINATION   Vital Signs: Temp:  [98.2 F (36.8 C)-99.1 F (37.3 C)] 98.4 F (36.9 C) (02/06 1145) Pulse Rate:  [69-89] 69 (02/06 1145) Resp:  [16-20] 18 (02/06 1145) BP: (106-157)/(63-98) 115/83 (02/06 1145) SpO2:  [91 %-98 %] 98 % (02/06 1145) FiO2 (%):  [28 %] 28 % (02/06 0811)    FiO2 (%):  [28 %] 28 %   Constitutional: anxious frail woman lying in bed  Eyes: eomi, pupils equal Ears, nose, mouth, and throat: trach in place, minmal secretions Cardiovascular: RRR,  Ext warm Respiratory: diminished with prolonged expiratory phase, occasional accessory muscle use Gastrointestinal: soft, abd wrapped after PEG, some mild tenderness at PEG insertion site MSK: muscle wasting Skin: No rashes, normal turgor, scattered bruising Neurologic: moves all 4 ext to command, weak Psychiatric: anxious, answering questions appropriately   Scheduled Meds: . acidophilus  1 capsule Oral TID  . apixaban  5 mg Oral BID  . arformoterol  15 mcg Nebulization BID  . clonazePAM  1 mg Oral BID  . feeding supplement  237 mL Oral BID BM  . fentaNYL  1 patch Transdermal Q72H  . fiber  1 packet Oral TID  . gabapentin  300 mg Oral TID  . metoprolol tartrate  50 mg Oral BID  . nystatin   Topical TID  . pantoprazole  40 mg Oral QHS  . QUEtiapine  25 mg Oral QHS   Continuous Infusions:  PRN Meds:.acetaminophen (TYLENOL) oral liquid 160 mg/5 mL, diazepam, ipratropium-albuterol, labetalol, naphazoline-glycerin, ondansetron (ZOFRAN) IV    ASSESSMENT AND PLAN   Acute on chronic hypoxemic and hypercapnic respiratory failure thought secondary to polypharmacy long wean from ventilator due to muscular deconditioning now s/p tracheostomy.         -Heavy phlegm production - Glycopyrrolate tid 1mg -07/21/20  Dysphagia, moderate protein calorie malnutrition- now s/p PEG  Severe baseline COPD- complicated trach wean - continue COPD carepath  Muscular deconditioning-PT/OT/CPT  Chronic anxiety/depression  Metabolic encephalopathy/agitation- improving  - repeat tracheal aspirate ordered -ENTEROBACTER AEROGENES - Switched from standing duonebs from duonebs to brovana/yupelri - PT/OT up to chair - Continue standing clonazepam, use valium per tube for breakthrough - SLP for PMV trials - g off IV  meds for rehab vs. SNF vs. LTACH vs home?    Patient chronically critically ill due to respiratory failure, metabolic encephalopathy Interventions to address this today weaning precedex, weaning vent Risk of deterioration without these interventions is high    Patient would benefit from [facility]. *This note was dictated using voice recognition software/Dragon.  Despite best efforts to proofread, errors can occur which can change the meaning.  Any change was purely unintentional.     [facility], M.D.  Pulmonary & Critical Care Medicine  [facility]

## 2020-07-30 NOTE — Plan of Care (Addendum)
Pt Axox4. Calm and cooperative and able to voice her needs. Occasionnal trach suctioning to remove thick secretions. Pt takes pills pills whole in applesauce. Pt would like to go home after PEG removal and trach removal. No signs of distress noted. Safety measures in place. Will continue to monitor.            Problem: Education: Goal: Knowledge of General Education information will improve Description: Including pain rating scale, medication(s)/side effects and non-pharmacologic comfort measures Outcome: Progressing   Problem: Health Behavior/Discharge Planning: Goal: Ability to manage health-related needs will improve Outcome: Progressing   Problem: Clinical Measurements: Goal: Ability to maintain clinical measurements within normal limits will improve Outcome: Progressing Goal: Will remain free from infection Outcome: Progressing Goal: Diagnostic test results will improve Outcome: Progressing Goal: Respiratory complications will improve Outcome: Progressing Goal: Cardiovascular complication will be avoided Outcome: Progressing   Problem: Pain Managment: Goal: General experience of comfort will improve Outcome: Progressing   Problem: Safety: Goal: Ability to remain free from injury will improve Outcome: Progressing   Problem: Skin Integrity: Goal: Risk for impaired skin integrity will decrease Outcome: Progressing

## 2020-07-30 NOTE — Progress Notes (Signed)
PROGRESS NOTE    Virginia [name]  DJT:[id number] DOB: 05/14/1951 DOA: 06/06/2020 PCP: Marguarite Arbour, MD   Chief complaint.  Shortness of breath Brief Narrative:  This 70 years old female with severe COPD admitted with acute on chronic hypoxic and hypercapnic respiratory failure and acute encephalopathy secondary to narcotic use and advanced COPD requiring mechanical ventilation. She was treated with steroids and IV antibiotics in the ICU . She could not be liberated from the ventilator so tracheostomy was performed and she has been receiving oxygen via trach collar.  She had prolonged ICU course.  PCCM pick up 1/14. Patient developed worsening hypoxia on 1/15.  Chest x-ray shows bilateral lower lobe infiltrate consistent with aspiration pneumonia. She was started and has completed antibiotics.  On 1/20 : patient had a sudden onset of altered mental status, ABG shows severe metabolic acidosis. Patient was transferred to ICU.  PCCM pickup 1/24: Acute encephalopathy resolved. CT head unremarkable for any acute stroke or bleeding. Mental status has normalized without any focal deficit. COPD appears stable.  1/26->transfer to med-surg. Waiting for LTAC decision. 1/27->LTAC declined. Waiting for SNF placement. 1/28->Oxygen requirement up to 10 L after PT/OT session.  1/29: > RRT called, O2 saturation dropped to 70s.  Found to have mucous plugging in the trach which was removed,  O2 saturation improved. 1/30- > Patient appears improved.  She is lying comfortably.  Oxygen requirement via trach 10 L. 1/31-> Given severe baseline COPD, would be difficult to wean trach.    2/1- > Difficult placement in LTAC.  TOC working to find place[place] [facility] [place] Awaiting response.  2/2: > Patient passed MBS,  Started on dysphagia 2 diet.  She is tolerating well.  Patient is requesting to remove the PEG tube before discharge.  2/3: >  Reached out to GI Dr.  Mia Creek about removing the PEG tube. Peg Tube be removed next week.  2/4>    Oxygen requirement vial Trach @ 8L. Participated in Physical Therapy, HHPT recommended.    Assessment & Plan:   Active Problems:   AF (paroxysmal atrial fibrillation) (HCC)   Anemia of chronic disease   COPD with acute lower respiratory infection (HCC)   Dysphagia   Respiratory failure (HCC)   COPD exacerbation (HCC)   Acute urinary retention   Acute metabolic encephalopathy   Acute on chronic respiratory failure with hypoxia and hypercapnia (HCC)   Aspiration pneumonia of both lower lobes due to gastric secretions (HCC)   Reactive thrombocytosis   Pressure injury of skin   Diarrhea  #1.  Acute on chronic respiratory failure with hypoxemia and hypercapnia. Status post tracheostomy and PEG tube placement. COPD exacerbation. Aspiration pneumonia secondary to Enterobacter aerogenes. Dysphagia. Patient condition has improved, she has completed antibiotics.  She is currently on 6 L oxygen.  Continue to wean.   Patient has passed speech evaluation and tolerating diet, currently on dysphagia 2 diet.  PEG tube will be removed tomorrow.  #2. Acute metabolic encephalopathy. Severe metabolic acidosis. Condition had improved.  #3.  Paroxysmal atrial fibrillation. Continue Eliquis.   DVT prophylaxis: Eliquis Code Status: Full Family Communication:  Disposition Plan:  .   Status is: Inpatient  Remains inpatient appropriate because:Unsafe d/c plan and Inpatient level of care appropriate due to severity of illness   Dispo: The patient is from: Home              Anticipated d/c is to: SNF  Anticipated d/c date is: 3 days              Patient currently is medically stable to d/c.   Difficult to place patient Yes        I/O last 3 completed shifts: In: 480 [P.O.:480] Out: 0  No intake/output data recorded.     Consultants:   Pulm  Procedures: Tracheostomy and PEG tube  placement  Antimicrobials: None  Subjective: Patient condition had improved, currently she is on 6 L oxygen over trach collar.  She does not have any short of breath.  Spoke with respiratory therapist, patient still requiring multiple suction during the day, she still has significant airway secretion which she was pretty thick. She had a bowel movements, no diarrhea.  No nausea vomiting abdominal pain. No fever or chills.  Objective: Vitals:   07/29/20 2041 07/30/20 0435 07/30/20 0811 07/30/20 0822  BP:  106/63  124/80  Pulse:  76  84  Resp:  20  18  Temp:  98.7 F (37.1 C)  99.1 F (37.3 C)  TempSrc:  Oral  Oral  SpO2: 95% 93% 97% 98%  Weight:      Height:        Intake/Output Summary (Last 24 hours) at 07/30/2020 1022 Last data filed at 07/30/2020 0459 Gross per 24 hour  Intake 240 ml  Output 0 ml  Net 240 ml   Filed Weights   07/16/20 0500 07/17/20 0426 07/18/20 0500  Weight: 56.4 kg 55.4 kg 54.9 kg    Examination:  General exam: Appears calm and comfortable  Respiratory system: Rhonchi bilaterally. Respiratory effort normal. Cardiovascular system: S1 & S2 heard, RRR. No JVD, murmurs, rubs, gallops or clicks. No pedal edema. Gastrointestinal system: Abdomen is nondistended, soft and nontender. No organomegaly or masses felt. Normal bowel sounds heard. Central nervous system: Alert and oriented. No focal neurological deficits. Extremities: Symmetric 5 x 5 power. Skin: No rashes, lesions or ulcers Psychiatry: Judgement and insight appear normal. Mood & affect appropriate.     Data Reviewed: I have personally reviewed following labs and imaging studies  CBC: Recent Labs  Lab 07/25/20 0341 07/26/20 0349 07/27/20 0447 07/28/20 0509 07/30/20 0450  WBC 7.6 8.2 9.1 7.4 6.9  HGB 9.8* 9.5* 9.5* 9.8* 9.7*  HCT 32.5* 31.5* 32.6* 32.6* 31.4*  MCV 92.9 94.3 96.4 95.0 90.0  PLT 373 344 357 345 389   Basic Metabolic Panel: Recent Labs  Lab 07/24/20 0528  07/25/20 0341 07/26/20 0349 07/28/20 0509  NA 141 140 140 139  K 4.0 4.3 4.6 4.5  CL 97* 94* 96* 90*  CO2 35* 35* 36* 39*  GLUCOSE 81 86 87 84  BUN 18 21 19 16   CREATININE 0.61 0.66 0.57 0.66  CALCIUM 9.5 9.2 8.9 9.5  MG 2.0  --  2.0 1.9  PHOS 5.2*  --  4.2 3.9   GFR: Estimated Creatinine Clearance: 52.5 mL/min (by C-G formula based on SCr of 0.66 mg/dL). Liver Function Tests: No results for input(s): AST, ALT, ALKPHOS, BILITOT, PROT, ALBUMIN in the last 168 hours. No results for input(s): LIPASE, AMYLASE in the last 168 hours. No results for input(s): AMMONIA in the last 168 hours. Coagulation Profile: No results for input(s): INR, PROTIME in the last 168 hours. Cardiac Enzymes: No results for input(s): CKTOTAL, CKMB, CKMBINDEX, TROPONINI in the last 168 hours. BNP (last 3 results) No results for input(s): PROBNP in the last 8760 hours. HbA1C: No results for input(s): HGBA1C in the last  72 hours. CBG: Recent Labs  Lab 07/23/20 2112  GLUCAP 110*   Lipid Profile: No results for input(s): CHOL, HDL, LDLCALC, TRIG, CHOLHDL, LDLDIRECT in the last 72 hours. Thyroid Function Tests: No results for input(s): TSH, T4TOTAL, FREET4, T3FREE, THYROIDAB in the last 72 hours. Anemia Panel: No results for input(s): VITAMINB12, FOLATE, FERRITIN, TIBC, IRON, RETICCTPCT in the last 72 hours. Sepsis Labs: No results for input(s): PROCALCITON, LATICACIDVEN in the last 168 hours.  No results found for this or any previous visit (from the past 240 hour(s)).       Radiology Studies: DG Ankle 2 Views Left  Result Date: 07/28/2020 CLINICAL DATA:  Left ankle pain, no reported injury. EXAM: LEFT ANKLE - 2 VIEW COMPARISON:  None. FINDINGS: Patient's sock creates artifact superimposed with the osseous structures. No acute osseous or joint abnormality. IMPRESSION: No acute osseous or joint abnormality. Electronically Signed   By: Leanna Battles M.D.   On: 07/28/2020 15:10         Scheduled Meds: . acidophilus  1 capsule Oral TID  . apixaban  5 mg Oral BID  . arformoterol  15 mcg Nebulization BID  . clonazePAM  1 mg Oral BID  . feeding supplement  237 mL Oral BID BM  . fentaNYL  1 patch Transdermal Q72H  . fiber  1 packet Oral TID  . gabapentin  300 mg Oral TID  . metoprolol tartrate  50 mg Oral BID  . nystatin   Topical TID  . pantoprazole  40 mg Oral QHS  . QUEtiapine  25 mg Oral QHS   Continuous Infusions:   LOS: 54 days    Time spent: 26 minutes    Marrion Coy, MD [facility]   To contact the attending provider between 7A-7P or the covering provider during after hours 7P-7A, please log into the web site [email or web address] and access using universal Power password for that web site. If you do not have the password, please call the hospital operator.  07/30/2020, 10:22 AM

## 2020-07-31 DIAGNOSIS — J9622 Acute and chronic respiratory failure with hypercapnia: Secondary | ICD-10-CM | POA: Diagnosis not present

## 2020-07-31 DIAGNOSIS — I480 Paroxysmal atrial fibrillation: Secondary | ICD-10-CM | POA: Diagnosis not present

## 2020-07-31 DIAGNOSIS — G9341 Metabolic encephalopathy: Secondary | ICD-10-CM | POA: Diagnosis not present

## 2020-07-31 DIAGNOSIS — J9621 Acute and chronic respiratory failure with hypoxia: Secondary | ICD-10-CM | POA: Diagnosis not present

## 2020-07-31 LAB — BLOOD GAS, ARTERIAL
Acid-Base Excess: 13.3 mmol/L — ABNORMAL HIGH (ref 0.0–2.0)
Bicarbonate: 39.8 mmol/L — ABNORMAL HIGH (ref 20.0–28.0)
FIO2: 0.28
O2 Saturation: 94.9 %
Patient temperature: 37
pCO2 arterial: 60 mmHg — ABNORMAL HIGH (ref 32.0–48.0)
pH, Arterial: 7.43 (ref 7.350–7.450)
pO2, Arterial: 73 mmHg — ABNORMAL LOW (ref 83.0–108.0)

## 2020-07-31 NOTE — Progress Notes (Signed)
Physical Therapy Treatment Patient Details Name: Virginia [name] MRN: [id number] DOB: 02-05-1951 Today's Date: 07/31/2020    History of Present Illness 70 yo female with severe COPD admitted with acute on chronic hypoxic hypercapnic respiratory failure and acute encephalopathy secondary to narcotic use and AECOPD requiring mechanical intubation. Initially intubated from 12/14-12/16 and then again from 12/18-12/23. Now with + trach collar. Recent hospitalization from 12/10-12/12/21. RR secondary to slurred speech and AMS. Transferred to CCU on 1/20 on full vent support. RE-evaluation performed this date as medical status has improved.    PT Comments    Pt is making good progress towards goals with improved ability to ambulate further distances. All mobility performed on 8L of O2 via trach collar. O2 sats WNL with exertion although increased SOB symptoms noted. Once back in bed, adjusted to baseline of 5L. Begins coughing fit with exertion, able to wipe secretions away. RW used for all mobility, trial of no AD with unsteadiness and safety concerns. Educated on use of RW at all times for safety. Pt very motivated and hopeful for home discharge.    Follow Up Recommendations  Home health PT;Supervision/Assistance - 24 hour     Equipment Recommendations  None recommended by PT    Recommendations for Other Services       Precautions / Restrictions Precautions Precautions: Fall Precaution Comments: trach Restrictions Weight Bearing Restrictions: No    Mobility  Bed Mobility Overal bed mobility: Modified Independent Bed Mobility: Supine to Sit     Supine to sit: Modified independent (Device/Increase time)     General bed mobility comments: safe technique.  Transfers Overall transfer level: Needs assistance Equipment used: Rolling walker (2 wheeled) Transfers: Sit to/from Stand Sit to Stand: Min guard         General transfer comment: safe technique with  RW  Ambulation/Gait Ambulation/Gait assistance: Min guard Gait Distance (Feet): 170 Feet Assistive device: Rolling walker (2 wheeled) Gait Pattern/deviations: Step-through pattern     General Gait Details: ambulated with steady gait. All mobility performed on 8L of O2 (increased from 5L at rest, per pt request). O2 sats >92% with all exertion.   Stairs             Wheelchair Mobility    Modified Rankin (Stroke Patients Only)       Balance Overall balance assessment: Needs assistance Sitting-balance support: Feet supported Sitting balance-Leahy Scale: Good     Standing balance support: Bilateral upper extremity supported;During functional activity Standing balance-Leahy Scale: Good                              Cognition Arousal/Alertness: Awake/alert Behavior During Therapy: WFL for tasks assessed/performed Overall Cognitive Status: Within Functional Limits for tasks assessed                                        Exercises Other Exercises Other Exercises: ambulated to bathroom with no AD for trial. Does reach out with B UE for furniture. Needs assist for transfer from lower surface. Min assist required    General Comments        Pertinent Vitals/Pain Pain Assessment: No/denies pain    Home Living                      Prior Function  PT Goals (current goals can now be found in the care plan section) Acute Rehab PT Goals Patient Stated Goal: To return home safely PT Goal Formulation: With patient Time For Goal Achievement: 07/30/20 Potential to Achieve Goals: Fair Progress towards PT goals: Progressing toward goals    Frequency    Min 2X/week      PT Plan Current plan remains appropriate    Co-evaluation              AM-PAC PT "6 Clicks" Mobility   Outcome Measure  Help needed turning from your back to your side while in a flat bed without using bedrails?: None Help needed moving  from lying on your back to sitting on the side of a flat bed without using bedrails?: None Help needed moving to and from a bed to a chair (including a wheelchair)?: A Little Help needed standing up from a chair using your arms (e.g., wheelchair or bedside chair)?: A Little Help needed to walk in hospital room?: A Little Help needed climbing 3-5 steps with a railing? : A Little 6 Click Score: 20    End of Session Equipment Utilized During Treatment: Gait belt;Oxygen Activity Tolerance: Patient tolerated treatment well Patient left: in bed;with bed alarm set;with call bell/phone within reach Nurse Communication: Mobility status PT Visit Diagnosis: Unsteadiness on feet (R26.81);Muscle weakness (generalized) (M62.81);Difficulty in walking, not elsewhere classified (R26.2)     Time: 1401-1450 PT Time Calculation (min) (ACUTE ONLY): 49 min  Charges:  $Gait Training: 23-37 mins $Therapeutic Activity: 8-22 mins                     Elizabeth Palau, PT, DPT [phone]    Virginia [name] 07/31/2020, 4:03 PM

## 2020-07-31 NOTE — Progress Notes (Signed)
SLP Cancellation Note  Patient Details Name: Virginia [name] MRN: [id number] DOB: [birth year]   Cancelled treatment:       Reason Eval/Treat Not Completed:  (chart reviewed; consulted MDs). Chart reviewed; consulted NSG re: pt's status; spoke w/ pt/family in room briefly. Pt continues to wear the PMV w/ monitoring of breathing and taking Rest Breaks when needed. Discussed w/ pt/family and MDs the recommendation of changing trach to a CUFFLESS trach, and for pt to continue wear the PMV during conversation and communication in ADLs w/ others and MUST wear during all oral intake for safer swallowing. Will f/u w/ MDs re: trach status. NSG agreed.     Jerilynn Som, MS, CCC-SLP Speech Language Pathologist Rehab Services [phone] [facility] 07/31/2020, 3:37 PM

## 2020-07-31 NOTE — TOC Progression Note (Signed)
Transition of Care [facility]) - Progression Note    Patient Details  Name: Virginia [name] MRN: [id number] Date of Birth: 06/27/1950  Transition of Care [facility]) CM/SW [place], LCSW Phone Number: 07/31/2020, 2:41 PM  Clinical Narrative: Still no bed offers. Received voicemail from [facility] representative stating that none of their nearby facilities take trach patients. She recommended calling [facility] in [place]. Left voicemail for the admissions coordinator. CSW met with patient and daughter-in-law at bedside. CSW provided updated and discussed home health with 24/7 supervision recommendations. Daughter-in-law stated she has personal care services through [employer] [facility] for 6 hours per day. Daughter-in-law unsure of how many days per week. Daughter-in-law will come to her home when the [facility] aide has left for the day. CSW called [facility] [place]. They confirmed patient is active with PCS services but could not provide number of hours she is receiving. They did say that in order to try and get more hours, her PCP will have to fill out the medical change of status [facility] form) on their website. CSW asked if the hospitalist could fill it out but [facility] representative said they prefer the PCP do it.    Barriers to Discharge: Continued Medical Work up  Expected Discharge Plan and Services   In-house Referral: Clinical Social Work,Hospice / Palliative Care   Post Acute Care Choice: Durable Medical Equipment (Oxygen 4L) Living arrangements for the past 2 months: Apartment                                       Social Determinants of Health (SDOH) Interventions    Readmission Risk Interventions No flowsheet data found.

## 2020-07-31 NOTE — Progress Notes (Signed)
PROGRESS NOTE    Virginia [name]  CHY:[id number] DOB: 06-Nov-1950 DOA: 06/06/2020 PCP: Marguarite Arbour, MD   Chief complaint.  Shortness of breath. Brief Narrative:  This 70 years old female with severe COPD admitted with acute on chronic hypoxic and hypercapnic respiratory failure and acute encephalopathy secondary to narcotic use and advanced COPD requiring mechanical ventilation. She was treated with steroids and IV antibioticsin the ICU. She could not be liberated from the ventilator so tracheostomy was performed and she has been receiving oxygen via trach collar.She had prolonged ICU course.  PCCM pick up 1/14.Patient developed worsening hypoxia on 1/15. Chest x-ray shows bilateral lower lobe infiltrate consistent with aspiration pneumonia. She was started andhas completed antibiotics.  On 1/20:patient had a sudden onset of altered mental status, ABG shows severe metabolic acidosis. Patient was transferred to ICU.  PCCM pickup 1/24: Acute encephalopathy resolved. CT head unremarkable for any acute stroke or bleeding. Mental status has normalized without any focal deficit. COPD appears stable.  1/26->transfer to med-surg. Waiting for LTAC decision. 1/27->LTAC declined. Waiting for SNF placement. 1/28->Oxygen requirement up to 10 L after PT/OT session.  1/29: >RRT called, O2 saturation dropped to 70s. Found to have mucous plugging in the trach which was removed, O2 saturation improved. 1/30- >Patient appears improved. She is lying comfortably. Oxygen requirement via trach 10 L. 1/31->Given severe baseline COPD, would be difficult to wean trach.  2/1- >Difficult placement in LTAC. TOC working to find place[facility] [place] Awaiting response. 2/2: >Patient passed MBS,Started on dysphagia 2 diet. She is tolerating well. Patient is requesting to remove the PEG tube before discharge. 2/3: >Reached out to GI Dr.  Mia Creek about removing the PEG tube. Peg Tube be removed next week. 2/4>Oxygen requirement vial Trach @ 8L. Participated in Physical Therapy, HHPT recommended.     Assessment & Plan:   Active Problems:   AF (paroxysmal atrial fibrillation) (HCC)   Anemia of chronic disease   COPD with acute lower respiratory infection (HCC)   Dysphagia   Respiratory failure (HCC)   COPD exacerbation (HCC)   Acute urinary retention   Acute metabolic encephalopathy   Acute on chronic respiratory failure with hypoxia and hypercapnia (HCC)   Aspiration pneumonia of both lower lobes due to gastric secretions (HCC)   Reactive thrombocytosis   Pressure injury of skin   Diarrhea   #1.  Acute on chronic respiratory failure with hypoxemia and hypercapnia. Status post tracheostomy and PEG tube placement. COPD exacerbation. Aspiration pneumonia secondary to Enterobacter aerogenes. Dysphagia. Patient condition continued to improve, currently on 5 L oxygen.  PEG tube will be removed today.  #2.  Acute metabolic cephalopathy. Severe metabolic acidosis Resolved  4.  Paroxysmal atrial fibrillation. Continue Eliquis.    DVT prophylaxis: Eliquis Code Status: Full Family Communication:  Disposition Plan:  .   Status is: Inpatient  Remains inpatient appropriate because:Inpatient level of care appropriate due to severity of illness   Dispo: The patient is from: Home              Anticipated d/c is to: SNF              Anticipated d/c date is: > 3 days              Patient currently is medically stable to d/c.   Difficult to place patient Yes        No intake/output data recorded. Total I/O In: 0  Out: 100 [Urine:100]  Consultants:   Pulm  Procedures: None  Antimicrobials: None  Subjective: Patient condition is stable, oxygenation is gradually getting better.  No signal short of breath.  She was able to work with physical therapy. No fever chills pain No abdominal  pain or nausea vomiting, she had a normal bowel movement yesterday.   Objective: Vitals:   07/30/20 1632 07/30/20 2036 07/30/20 2116 07/31/20 0428  BP: 125/80  (!) 159/113 115/78  Pulse: 79  89 76  Resp: 18  18 20   Temp: 97.9 F (36.6 C)  98.2 F (36.8 C) 98.8 F (37.1 C)  TempSrc: Oral  Oral Oral  SpO2: 90% 93% 92% 100%  Weight:      Height:        Intake/Output Summary (Last 24 hours) at 07/31/2020 1033 Last data filed at 07/31/2020 1011 Gross per 24 hour  Intake 0 ml  Output 100 ml  Net -100 ml   Filed Weights   07/16/20 0500 07/17/20 0426 07/18/20 0500  Weight: 56.4 kg 55.4 kg 54.9 kg    Examination:  General exam: Appears calm and comfortable  Respiratory system: Decreased breathing sounds. Respiratory effort normal. Cardiovascular system: S1 & S2 heard, RRR. No JVD, murmurs, rubs, gallops or clicks. No pedal edema. Gastrointestinal system: Abdomen is nondistended, soft and nontender. No organomegaly or masses felt. Normal bowel sounds heard. Central nervous system: Alert and oriented. No focal neurological deficits. Extremities: Symmetric 5 x 5 power. Skin: No rashes, lesions or ulcers Psychiatry: Judgement and insight appear normal. Mood & affect appropriate.     Data Reviewed: I have personally reviewed following labs and imaging studies  CBC: Recent Labs  Lab 07/25/20 0341 07/26/20 0349 07/27/20 0447 07/28/20 0509 07/30/20 0450  WBC 7.6 8.2 9.1 7.4 6.9  HGB 9.8* 9.5* 9.5* 9.8* 9.7*  HCT 32.5* 31.5* 32.6* 32.6* 31.4*  MCV 92.9 94.3 96.4 95.0 90.0  PLT 373 344 357 345 389   Basic Metabolic Panel: Recent Labs  Lab 07/25/20 0341 07/26/20 0349 07/28/20 0509  NA 140 140 139  K 4.3 4.6 4.5  CL 94* 96* 90*  CO2 35* 36* 39*  GLUCOSE 86 87 84  BUN 21 19 16   CREATININE 0.66 0.57 0.66  CALCIUM 9.2 8.9 9.5  MG  --  2.0 1.9  PHOS  --  4.2 3.9   GFR: Estimated Creatinine Clearance: 52.5 mL/min (by C-G formula based on SCr of 0.66 mg/dL). Liver  Function Tests: No results for input(s): AST, ALT, ALKPHOS, BILITOT, PROT, ALBUMIN in the last 168 hours. No results for input(s): LIPASE, AMYLASE in the last 168 hours. No results for input(s): AMMONIA in the last 168 hours. Coagulation Profile: No results for input(s): INR, PROTIME in the last 168 hours. Cardiac Enzymes: No results for input(s): CKTOTAL, CKMB, CKMBINDEX, TROPONINI in the last 168 hours. BNP (last 3 results) No results for input(s): PROBNP in the last 8760 hours. HbA1C: No results for input(s): HGBA1C in the last 72 hours. CBG: No results for input(s): GLUCAP in the last 168 hours. Lipid Profile: No results for input(s): CHOL, HDL, LDLCALC, TRIG, CHOLHDL, LDLDIRECT in the last 72 hours. Thyroid Function Tests: No results for input(s): TSH, T4TOTAL, FREET4, T3FREE, THYROIDAB in the last 72 hours. Anemia Panel: No results for input(s): VITAMINB12, FOLATE, FERRITIN, TIBC, IRON, RETICCTPCT in the last 72 hours. Sepsis Labs: No results for input(s): PROCALCITON, LATICACIDVEN in the last 168 hours.  No results found for this or any previous visit (from the past 240 hour(s)).  Radiology Studies: No results found.      Scheduled Meds: . acidophilus  1 capsule Oral TID  . apixaban  5 mg Oral BID  . arformoterol  15 mcg Nebulization BID  . clonazePAM  1 mg Oral BID  . feeding supplement  237 mL Oral BID BM  . fentaNYL  1 patch Transdermal Q72H  . fiber  1 packet Oral TID  . gabapentin  300 mg Oral TID  . metoprolol tartrate  50 mg Oral BID  . nystatin   Topical TID  . pantoprazole  40 mg Oral QHS  . QUEtiapine  25 mg Oral QHS   Continuous Infusions:   LOS: 55 days    Time spent: 27 minutes    Marrion Coy, MD [facility]   To contact the attending provider between 7A-7P or the covering provider during after hours 7P-7A, please log into the web site [email or web address] and access using universal [place] password for that web site. If you  do not have the password, please call the hospital operator.  07/31/2020, 10:33 AM

## 2020-08-01 DIAGNOSIS — J9621 Acute and chronic respiratory failure with hypoxia: Secondary | ICD-10-CM | POA: Diagnosis not present

## 2020-08-01 DIAGNOSIS — J441 Chronic obstructive pulmonary disease with (acute) exacerbation: Secondary | ICD-10-CM | POA: Diagnosis not present

## 2020-08-01 DIAGNOSIS — I480 Paroxysmal atrial fibrillation: Secondary | ICD-10-CM | POA: Diagnosis not present

## 2020-08-01 DIAGNOSIS — G9341 Metabolic encephalopathy: Secondary | ICD-10-CM | POA: Diagnosis not present

## 2020-08-01 LAB — CBC WITH DIFFERENTIAL/PLATELET
Abs Immature Granulocytes: 0.03 10*3/uL (ref 0.00–0.07)
Basophils Absolute: 0.1 10*3/uL (ref 0.0–0.1)
Basophils Relative: 1 %
Eosinophils Absolute: 0.5 10*3/uL (ref 0.0–0.5)
Eosinophils Relative: 6 %
HCT: 33.6 % — ABNORMAL LOW (ref 36.0–46.0)
Hemoglobin: 10.3 g/dL — ABNORMAL LOW (ref 12.0–15.0)
Immature Granulocytes: 0 %
Lymphocytes Relative: 23 %
Lymphs Abs: 1.9 10*3/uL (ref 0.7–4.0)
MCH: 28 pg (ref 26.0–34.0)
MCHC: 30.7 g/dL (ref 30.0–36.0)
MCV: 91.3 fL (ref 80.0–100.0)
Monocytes Absolute: 0.8 10*3/uL (ref 0.1–1.0)
Monocytes Relative: 10 %
Neutro Abs: 4.8 10*3/uL (ref 1.7–7.7)
Neutrophils Relative %: 60 %
Platelets: 405 10*3/uL — ABNORMAL HIGH (ref 150–400)
RBC: 3.68 MIL/uL — ABNORMAL LOW (ref 3.87–5.11)
RDW: 15.6 % — ABNORMAL HIGH (ref 11.5–15.5)
WBC: 8.1 10*3/uL (ref 4.0–10.5)
nRBC: 0 % (ref 0.0–0.2)

## 2020-08-01 LAB — BASIC METABOLIC PANEL
Anion gap: 9 (ref 5–15)
BUN: 10 mg/dL (ref 8–23)
CO2: 35 mmol/L — ABNORMAL HIGH (ref 22–32)
Calcium: 9.3 mg/dL (ref 8.9–10.3)
Chloride: 94 mmol/L — ABNORMAL LOW (ref 98–111)
Creatinine, Ser: 0.72 mg/dL (ref 0.44–1.00)
GFR, Estimated: >60 mL/min (ref >60)
Glucose, Bld: 88 mg/dL (ref 70–99)
Potassium: 3.6 mmol/L (ref 3.5–5.1)
Sodium: 138 mmol/L (ref 135–145)

## 2020-08-01 LAB — MAGNESIUM: Magnesium: 1.8 mg/dL (ref 1.7–2.4)

## 2020-08-01 LAB — GLUCOSE, CAPILLARY: Glucose-Capillary: 86 mg/dL (ref 70–99)

## 2020-08-01 MED ORDER — ACETYLCYSTEINE 20 % IN SOLN
4.0000 mL | Freq: Three times a day (TID) | RESPIRATORY_TRACT | Status: DC
  Administered 2020-08-01 – 2020-08-04 (×9): 4 mL via RESPIRATORY_TRACT
  Filled 2020-08-01 (×11): qty 4

## 2020-08-01 MED ORDER — ENOXAPARIN SODIUM 40 MG/0.4ML ~~LOC~~ SOLN
40.0000 mg | SUBCUTANEOUS | Status: DC
  Administered 2020-08-01: 40 mg via SUBCUTANEOUS
  Filled 2020-08-01: qty 0.4

## 2020-08-01 NOTE — Progress Notes (Signed)
Physical Therapy Treatment Patient Details Name: Virginia [name] MRN: [id number] DOB: 07/26/1950 Today's Date: 08/01/2020    History of Present Illness 70 yo female with severe COPD admitted with acute on chronic hypoxic hypercapnic respiratory failure and acute encephalopathy secondary to narcotic use and AECOPD requiring mechanical intubation. Initially intubated from 12/14-12/16 and then again from 12/18-12/23. Now with + trach collar. Recent hospitalization from 12/10-12/12/21. RR secondary to slurred speech and AMS. Transferred to CCU on 1/20 on full vent support. RE-evaluation performed this date as medical status has improved.    PT Comments    Pt was long sitting in bed upon arriving. She agrees to PT session and is cooperative throughout. " Dr says I will be here two more weeks." Clarified that MD did not state this. Author discussed safety concerns and DC disposition with pt. She is progressing well and acute PT recommends DC home with HHPT once pt is comfortable with trach care. Will benefit form education/training with pt and pt's daughter. She was able to exit bed on 6 L trach collar, stand, and ambulate 200 ft without LOB or unsteadiness. Pt does have anxiety towards end of ambulation. RR elevated. Needed relaxation techniques to calm and RT performed deep suction to clear airways. Acute PT recommends DC home with 24 hour supervision + HHPT.    Follow Up Recommendations  Home health PT;Supervision/Assistance - 24 hour     Equipment Recommendations  None recommended by PT    Recommendations for Other Services       Precautions / Restrictions Precautions Precautions: Fall Restrictions Weight Bearing Restrictions: No    Mobility  Bed Mobility Overal bed mobility: Modified Independent    Transfers Overall transfer level: Needs assistance Equipment used: Rolling walker (2 wheeled) Transfers: Sit to/from Stand Sit to Stand: Supervision     Ambulation/Gait Ambulation/Gait assistance: Min guard Gait Distance (Feet): 200 Feet Assistive device: Rolling walker (2 wheeled) Gait Pattern/deviations: Step-through pattern Gait velocity: decreased   General Gait Details: Pt was able to ambulate 1 lap in hallway on 6 L trach collar without desaturation. pt does have elevated RR once returned to room. Anxiety continues to limit pt moreso that respiratory/physical concerns. RT in room for trach care at conclusion of session         Balance Overall balance assessment: Needs assistance Sitting-balance support: Feet supported Sitting balance-Leahy Scale: Good Sitting balance - Comments: no balance deficits sitting EOB   Standing balance support: Bilateral upper extremity supported;During functional activity Standing balance-Leahy Scale: Good       Cognition Arousal/Alertness: Awake/alert Behavior During Therapy: WFL for tasks assessed/performed Overall Cognitive Status: Within Functional Limits for tasks assessed      General Comments: Pt was A and O x 4. She agrees to PT session with minimal encouragement. on 8L trach collar upon arriving. Was able to wean to 6L trach collar during ambulation                    Pertinent Vitals/Pain Pain Assessment: No/denies pain Pain Score: 0-No pain           PT Goals (current goals can now be found in the care plan section) Acute Rehab PT Goals Patient Stated Goal: To return home safely Progress towards PT goals: Progressing toward goals    Frequency    Min 2X/week      PT Plan Current plan remains appropriate    Co-evaluation     PT goals addressed during session: Mobility/safety with mobility;Proper  use of DME        AM-PAC PT "6 Clicks" Mobility   Outcome Measure  Help needed turning from your back to your side while in a flat bed without using bedrails?: None Help needed moving from lying on your back to sitting on the side of a flat bed without using  bedrails?: None Help needed moving to and from a bed to a chair (including a wheelchair)?: A Little Help needed standing up from a chair using your arms (e.g., wheelchair or bedside chair)?: A Little Help needed to walk in hospital room?: A Little Help needed climbing 3-5 steps with a railing? : A Little 6 Click Score: 20    End of Session Equipment Utilized During Treatment: Gait belt;Oxygen Activity Tolerance: Patient tolerated treatment well;Patient limited by fatigue;Other (comment) (anxiety with exertion) Patient left: in bed;with bed alarm set;with call bell/phone within reach Nurse Communication: Mobility status PT Visit Diagnosis: Unsteadiness on feet (R26.81);Muscle weakness (generalized) (M62.81);Difficulty in walking, not elsewhere classified (R26.2)     Time: 1610-9604 PT Time Calculation (min) (ACUTE ONLY): 30 min  Charges:  $Gait Training: 8-22 mins $Therapeutic Activity: 8-22 mins                     Jetta Lout PTA 08/01/20, 1:35 PM

## 2020-08-01 NOTE — TOC Progression Note (Signed)
Transition of Care [facility]) - Progression Note    Patient Details  Name: Virginia [name] MRN: [id number] Date of Birth: Oct 17, 1950  Transition of Care [facility]) CM/SW Contact  Margarito Liner, LCSW Phone Number: 08/01/2020, 10:44 AM  Clinical Narrative:  Notified [employer] representative that patient is on 5 L at rest and 8 L while ambulating. He said this will be fine since their concentrators go up to 10 L.     Barriers to Discharge: Continued Medical Work up  Expected Discharge Plan and Services   In-house Referral: Clinical Social Work,Hospice / Palliative Care   Post Acute Care Choice: Durable Medical Equipment (Oxygen 4L) Living arrangements for the past 2 months: Apartment                                       Social Determinants of Health (SDOH) Interventions    Readmission Risk Interventions No flowsheet data found.

## 2020-08-01 NOTE — Progress Notes (Signed)
PROGRESS NOTE    Virginia [name]  IOE:[id number] DOB: 10/09/1950 DOA: 06/06/2020 PCP: Marguarite Arbour, MD   Chief complaint.  Shortness of breath. Brief Narrative:  This 70 years old female with severe COPD admitted with acute on chronic hypoxic and hypercapnic respiratory failure and acute encephalopathy secondary to narcotic use and advanced COPD requiring mechanical ventilation. She was treated with steroids and IV antibioticsin the ICU. She could not be liberated from the ventilator so tracheostomy was performed and she has been receiving oxygen via trach collar.She had prolonged ICU course.  PCCM pick up 1/14.Patient developed worsening hypoxia on 1/15. Chest x-ray shows bilateral lower lobe infiltrate consistent with aspiration pneumonia. She was started andhas completed antibiotics.  On 1/20:patient had a sudden onset of altered mental status, ABG shows severe metabolic acidosis. Patient was transferred to ICU.  PCCM pickup 1/24: Acute encephalopathy resolved. CT head unremarkable for any acute stroke or bleeding. Mental status has normalized without any focal deficit. COPD appears stable.  1/26->transfer to med-surg. Waiting for LTAC decision. 1/27->LTAC declined. Waiting for SNF placement. 1/28->Oxygen requirement up to 10 L after PT/OT session.  1/29: >RRT called, O2 saturation dropped to 70s. Found to have mucous plugging in the trach which was removed, O2 saturation improved. 1/30- >Patient appears improved. She is lying comfortably. Oxygen requirement via trach 10 L. 1/31->Given severe baseline COPD, would be difficult to wean trach.  2/1- >Difficult placement in LTAC. TOC working to find place[place] [facility] [place] Awaiting response. 2/2: >Patient passed MBS,Started on dysphagia 2 diet. She is tolerating well. Patient is requesting to remove the PEG tube before discharge. 2/3: >Reached out to GI Dr.  Mia Creek about removing the PEG tube. Peg Tube be removed next week. 2/4>Oxygen requirement vial Trach @ 8L. Participated in Physical Therapy, HHPT recommended.    Assessment & Plan:   Active Problems:   AF (paroxysmal atrial fibrillation) (HCC)   Anemia of chronic disease   COPD with acute lower respiratory infection (HCC)   Dysphagia   Respiratory failure (HCC)   COPD exacerbation (HCC)   Acute urinary retention   Acute metabolic encephalopathy   Acute on chronic respiratory failure with hypoxia and hypercapnia (HCC)   Aspiration pneumonia of both lower lobes due to gastric secretions (HCC)   Reactive thrombocytosis   Pressure injury of skin   Diarrhea   #1. Acute on chronic respiratory failure with hypoxemia and hypercapnia. Status post tracheostomy and PEG tube placement. COPD exacerbation. Aspiration pneumonia secondary to Enterobacter aerogenes. Dysphagia. Patient condition gradually improving, she is still on 5 L oxygen over trach collar.  Spoke with Dr. Mia Creek, PEG tube will be removed on Wednesday.  Discontinued Eliquis to prepare for procedure. Patient still has significant secretion requiring airway suction 6-7 times per day.  I am concerned that patient may not be safe to go home because of this. Patient ABG showed baseline CO2 retention which is quite severe.  I am afraid patient may not be able to take out her tracheostomy in the future I have a discussion with patient daughter, pulmonology, social worker, currently we have a difficult time finding a place to accept the patient in a nursing home.  Talk to patient daughter, she is okay for patient to go home, granted that we have a trilogy set up at nighttime. I will add Mucomyst to see if I can make the mucus thinner and easier to suction. I need to keep patient in the hospital for a  couple more days and she will have a trilogy set up and until the secretion is better.   #2.  Acute metabolic  cephalopathy. Severe metabolic acidosis. Condition had improved.  4.  Paroxysmal atrial fibrillation. Eliquis on hold due to procedure.  We will restart once PEG tube is removed     DVT prophylaxis: lovenox Code Status: Full Family Communication: daughter updated Disposition Plan:  .   Status is: Inpatient  Remains inpatient appropriate because:Inpatient level of care appropriate due to severity of illness   Dispo: The patient is from: Home              Anticipated d/c is to: Home              Anticipated d/c date is: 3 days              Patient currently is not medically stable to d/c.   Difficult to place patient Yes        I/O last 3 completed shifts: In: 480 [P.O.:480] Out: 100 [Urine:100] Total I/O In: 240 [P.O.:240] Out: 1 [Stool:1]     Consultants:   Pulm  Procedures:   Antimicrobials: None  Subjective: Patient feels better, she no longer feels short of breath on 5 L oxygen.  She still has significant amount of airway secretion, requiring 6-7 times a day. No fever chills. No abdominal pain or nausea vomiting No dysuria hematuria. No headache or dizziness. No chest pain or palpitation.  Objective: Vitals:   08/01/20 0933 08/01/20 1130 08/01/20 1141 08/01/20 1142  BP: (!) 146/94  (!) 142/129 (!) 144/95  Pulse: 95  80 80  Resp: (!) 30  18   Temp:   98.6 F (37 C)   TempSrc:   Oral   SpO2: 95% 92% 92% (!) 89%  Weight:      Height:        Intake/Output Summary (Last 24 hours) at 08/01/2020 1249 Last data filed at 08/01/2020 0900 Gross per 24 hour  Intake 720 ml  Output 1 ml  Net 719 ml   Filed Weights   07/16/20 0500 07/17/20 0426 07/18/20 0500  Weight: 56.4 kg 55.4 kg 54.9 kg    Examination:  General exam: Appears calm and comfortable  Respiratory system: Decreased breathing sounds. Respiratory effort normal. Cardiovascular system: S1 & S2 heard, RRR. No JVD, murmurs, rubs, gallops or clicks. No pedal edema. Gastrointestinal  system: Abdomen is nondistended, soft and nontender. No organomegaly or masses felt. Normal bowel sounds heard. Central nervous system: Alert and oriented. No focal neurological deficits. Extremities: Symmetric 5 x 5 power. Skin: No rashes, lesions or ulcers Psychiatry: Judgement and insight appear normal. Mood & affect appropriate.     Data Reviewed: I have personally reviewed following labs and imaging studies  CBC: Recent Labs  Lab 07/26/20 0349 07/27/20 0447 07/28/20 0509 07/30/20 0450 08/01/20 0416  WBC 8.2 9.1 7.4 6.9 8.1  NEUTROABS  --   --   --   --  4.8  HGB 9.5* 9.5* 9.8* 9.7* 10.3*  HCT 31.5* 32.6* 32.6* 31.4* 33.6*  MCV 94.3 96.4 95.0 90.0 91.3  PLT 344 357 345 389 405*   Basic Metabolic Panel: Recent Labs  Lab 07/26/20 0349 07/28/20 0509 08/01/20 0416  NA 140 139 138  K 4.6 4.5 3.6  CL 96* 90* 94*  CO2 36* 39* 35*  GLUCOSE 87 84 88  BUN 19 16 10   CREATININE 0.57 0.66 0.72  CALCIUM 8.9 9.5 9.3  MG  2.0 1.9 1.8  PHOS 4.2 3.9  --    GFR: Estimated Creatinine Clearance: 52.5 mL/min (by C-G formula based on SCr of 0.72 mg/dL). Liver Function Tests: No results for input(s): AST, ALT, ALKPHOS, BILITOT, PROT, ALBUMIN in the last 168 hours. No results for input(s): LIPASE, AMYLASE in the last 168 hours. No results for input(s): AMMONIA in the last 168 hours. Coagulation Profile: No results for input(s): INR, PROTIME in the last 168 hours. Cardiac Enzymes: No results for input(s): CKTOTAL, CKMB, CKMBINDEX, TROPONINI in the last 168 hours. BNP (last 3 results) No results for input(s): PROBNP in the last 8760 hours. HbA1C: No results for input(s): HGBA1C in the last 72 hours. CBG: Recent Labs  Lab 08/01/20 0927  GLUCAP 86   Lipid Profile: No results for input(s): CHOL, HDL, LDLCALC, TRIG, CHOLHDL, LDLDIRECT in the last 72 hours. Thyroid Function Tests: No results for input(s): TSH, T4TOTAL, FREET4, T3FREE, THYROIDAB in the last 72 hours. Anemia  Panel: No results for input(s): VITAMINB12, FOLATE, FERRITIN, TIBC, IRON, RETICCTPCT in the last 72 hours. Sepsis Labs: No results for input(s): PROCALCITON, LATICACIDVEN in the last 168 hours.  No results found for this or any previous visit (from the past 240 hour(s)).       Radiology Studies: No results found.      Scheduled Meds: . acidophilus  1 capsule Oral TID  . arformoterol  15 mcg Nebulization BID  . clonazePAM  1 mg Oral BID  . feeding supplement  237 mL Oral BID BM  . fentaNYL  1 patch Transdermal Q72H  . gabapentin  300 mg Oral TID  . metoprolol tartrate  50 mg Oral BID  . nystatin   Topical TID  . pantoprazole  40 mg Oral QHS  . QUEtiapine  25 mg Oral QHS   Continuous Infusions:   LOS: 56 days    Time spent: 34 minutes, more than 50% time used for direct patient care.    Marrion Coy, MD [facility]   To contact the attending provider between 7A-7P or the covering provider during after hours 7P-7A, please log into the web site [email or web address] and access using universal [place] password for that web site. If you do not have the password, please call the hospital operator.  08/01/2020, 12:49 PM

## 2020-08-01 NOTE — Progress Notes (Signed)
OT Cancellation Note  Patient Details Name: Virginia [name] MRN: [id number] DOB: [birth year]   Cancelled Treatment:    Reason Eval/Treat Not Completed: Patient declined, no reason specified. Pt greeting therapist with, " No, not today. I just don't feel good." Pt verbalized she may be willing to attempt later in the day. OT will re-attempt as time allows.   Jackquline Denmark, MS, OTR/L , CBIS ascom [phone]  08/01/20, 9:39 AM   08/01/2020, 9:38 AM

## 2020-08-01 NOTE — Plan of Care (Signed)
  Problem: Clinical Measurements: Goal: Respiratory complications will improve Outcome: Progressing   Problem: Activity: Goal: Risk for activity intolerance will decrease Outcome: Progressing   Problem: Safety: Goal: Ability to remain free from injury will improve Outcome: Progressing   

## 2020-08-01 NOTE — Progress Notes (Addendum)
Patient having difficulty breathing. Per patient she doesn't feel right. Patient suctioned. RT at bedside. MD updated.   Madie Reno, RN     08/01/20 0933  Vitals  BP (!) 146/94  MAP (mmHg) 109  BP Method Automatic  Pulse Rate 95  Pulse Rate Source Monitor  Resp (!) 30  MEWS COLOR  MEWS Score Color Yellow  Oxygen Therapy  SpO2 95 %  O2 Device Tracheostomy Collar  O2 Flow Rate (L/min) 5 L/min  FiO2 (%) 28 %  MEWS Score  MEWS Temp 0  MEWS Systolic 0  MEWS Pulse 0  MEWS RR 2  MEWS LOC 0  MEWS Score 2

## 2020-08-02 DIAGNOSIS — J9621 Acute and chronic respiratory failure with hypoxia: Secondary | ICD-10-CM | POA: Diagnosis not present

## 2020-08-02 DIAGNOSIS — G9341 Metabolic encephalopathy: Secondary | ICD-10-CM | POA: Diagnosis not present

## 2020-08-02 DIAGNOSIS — I480 Paroxysmal atrial fibrillation: Secondary | ICD-10-CM | POA: Diagnosis not present

## 2020-08-02 DIAGNOSIS — J9622 Acute and chronic respiratory failure with hypercapnia: Secondary | ICD-10-CM | POA: Diagnosis not present

## 2020-08-02 LAB — CBC WITH DIFFERENTIAL/PLATELET
Abs Immature Granulocytes: 0.04 10*3/uL (ref 0.00–0.07)
Basophils Absolute: 0.1 10*3/uL (ref 0.0–0.1)
Basophils Relative: 1 %
Eosinophils Absolute: 0.5 10*3/uL (ref 0.0–0.5)
Eosinophils Relative: 6 %
HCT: 33.1 % — ABNORMAL LOW (ref 36.0–46.0)
Hemoglobin: 10.1 g/dL — ABNORMAL LOW (ref 12.0–15.0)
Immature Granulocytes: 1 %
Lymphocytes Relative: 27 %
Lymphs Abs: 2.1 10*3/uL (ref 0.7–4.0)
MCH: 28.1 pg (ref 26.0–34.0)
MCHC: 30.5 g/dL (ref 30.0–36.0)
MCV: 91.9 fL (ref 80.0–100.0)
Monocytes Absolute: 0.7 10*3/uL (ref 0.1–1.0)
Monocytes Relative: 9 %
Neutro Abs: 4.4 10*3/uL (ref 1.7–7.7)
Neutrophils Relative %: 56 %
Platelets: 416 10*3/uL — ABNORMAL HIGH (ref 150–400)
RBC: 3.6 MIL/uL — ABNORMAL LOW (ref 3.87–5.11)
RDW: 15.5 % (ref 11.5–15.5)
WBC: 7.6 10*3/uL (ref 4.0–10.5)
nRBC: 0 % (ref 0.0–0.2)

## 2020-08-02 LAB — BASIC METABOLIC PANEL
Anion gap: 10 (ref 5–15)
BUN: 15 mg/dL (ref 8–23)
CO2: 36 mmol/L — ABNORMAL HIGH (ref 22–32)
Calcium: 9.2 mg/dL (ref 8.9–10.3)
Chloride: 95 mmol/L — ABNORMAL LOW (ref 98–111)
Creatinine, Ser: 0.86 mg/dL (ref 0.44–1.00)
GFR, Estimated: >60 mL/min (ref >60)
Glucose, Bld: 86 mg/dL (ref 70–99)
Potassium: 3.6 mmol/L (ref 3.5–5.1)
Sodium: 141 mmol/L (ref 135–145)

## 2020-08-02 MED ORDER — ADULT MULTIVITAMIN W/MINERALS CH
1.0000 | ORAL_TABLET | Freq: Every day | ORAL | Status: DC
  Administered 2020-08-03 – 2020-08-11 (×9): 1 via ORAL
  Filled 2020-08-02 (×9): qty 1

## 2020-08-02 MED ORDER — ENSURE ENLIVE PO LIQD
237.0000 mL | Freq: Three times a day (TID) | ORAL | Status: DC
  Administered 2020-08-05 – 2020-08-11 (×17): 237 mL via ORAL

## 2020-08-02 MED ORDER — CLONAZEPAM 0.5 MG PO TABS
0.5000 mg | ORAL_TABLET | Freq: Two times a day (BID) | ORAL | Status: DC
  Administered 2020-08-02 – 2020-08-04 (×4): 0.5 mg via ORAL
  Filled 2020-08-02 (×4): qty 1

## 2020-08-02 MED ORDER — APIXABAN 5 MG PO TABS
5.0000 mg | ORAL_TABLET | Freq: Two times a day (BID) | ORAL | Status: DC
  Administered 2020-08-02 – 2020-08-11 (×19): 5 mg via ORAL
  Filled 2020-08-02 (×19): qty 1

## 2020-08-02 NOTE — Consult Note (Signed)
Virginia [name], Huy [id number] Dec 12, 1950 Sandi Mealy, MD   SUBJECTIVE: This 70 y.o. year old female is status post tracheostomy 06/15/20 for respiratory failure after COPD exacerbation, her second trach for this issue. She is now off the vent and after discussion with pulmonology they feel we can remove the cuffed tube as further immediate need for the vent seems unlikely.  Medications:  Current Facility-Administered Medications  Medication Dose Route Frequency Provider Last Rate Last Admin  . acetaminophen (TYLENOL) 160 MG/5ML solution 650 mg  650 mg Per Tube Q4H PRN Arbie Cookey, MD   650 mg at 08/01/20 2117  . acetylcysteine (MUCOMYST) 20 % nebulizer / oral solution 4 mL  4 mL Nebulization TID Marrion Coy, MD   4 mL at 08/02/20 1432  . acidophilus (RISAQUAD) capsule 1 capsule  1 capsule Oral TID Otelia Sergeant, RPH   1 capsule at 08/02/20 1633  . apixaban (ELIQUIS) tablet 5 mg  5 mg Oral BID Marrion Coy, MD   5 mg at 08/02/20 1103  . arformoterol (BROVANA) nebulizer solution 15 mcg  15 mcg Nebulization BID Arbie Cookey, MD   15 mcg at 08/02/20 0758  . clonazePAM (KLONOPIN) tablet 0.5 mg  0.5 mg Oral BID Marrion Coy, MD      . diazepam (VALIUM) tablet 5 mg  5 mg Per Tube Q6H PRN Arbie Cookey, MD   5 mg at 08/02/20 1501  . feeding supplement (ENSURE ENLIVE / ENSURE PLUS) liquid 237 mL  237 mL Oral TID BM Marrion Coy, MD      . fentaNYL (DURAGESIC) 50 MCG/HR 1 patch  1 patch Transdermal Q72H Arbie Cookey, MD   1 patch at 08/01/20 1515  . gabapentin (NEURONTIN) capsule 300 mg  300 mg Oral TID Lowella Bandy, RPH   300 mg at 08/02/20 1633  . ipratropium-albuterol (DUONEB) 0.5-2.5 (3) MG/3ML nebulizer solution 3 mL  3 mL Nebulization Q6H PRN Arbie Cookey, MD   3 mL at 08/02/20 1505  . labetalol (NORMODYNE) injection 10 mg  10 mg Intravenous Q4H PRN Arbie Cookey, MD   10 mg at 06/28/20 0752  . metoprolol tartrate (LOPRESSOR) tablet 50 mg  50 mg Oral BID Cipriano Bunker, MD   50 mg  at 08/02/20 1103  . [START ON 08/03/2020] multivitamin with minerals tablet 1 tablet  1 tablet Oral Daily Marrion Coy, MD      . naphazoline-glycerin (CLEAR EYES REDNESS) ophth solution 1-2 drop  1-2 drop Both Eyes QID PRN Arbie Cookey, MD   2 drop at 07/12/20 1040  . nystatin (MYCOSTATIN/NYSTOP) topical powder   Topical TID Cipriano Bunker, MD   Given at 07/31/20 1013  . ondansetron (ZOFRAN) injection 4 mg  4 mg Intravenous Q6H PRN Arbie Cookey, MD   4 mg at 07/19/20 1511  . pantoprazole (PROTONIX) EC tablet 40 mg  40 mg Oral QHS Cipriano Bunker, MD   40 mg at 08/01/20 2117  . QUEtiapine (SEROQUEL) tablet 25 mg  25 mg Oral QHS Lowella Bandy, RPH   25 mg at 08/01/20 2117  .  Medications Prior to Admission  Medication Sig Dispense Refill  . albuterol (VENTOLIN HFA) 108 (90 Base) MCG/ACT inhaler Inhale 2 puffs into the lungs every 4 (four) hours as needed for wheezing or shortness of breath.    Marland Kitchen amitriptyline (ELAVIL) 25 MG tablet Take 25 mg by mouth at bedtime.     . diazepam (VALIUM) 5 MG tablet Take 5 mg by mouth every 8 (eight) hours  as needed for anxiety.   5  . diltiazem (CARDIZEM CD) 120 MG 24 hr capsule Take 120 mg by mouth daily.    Marland Kitchen estradiol (ESTRACE) 1 MG tablet Take 1 mg by mouth daily.  1  . furosemide (LASIX) 20 MG tablet Take 20 mg by mouth daily.   1  . gabapentin (NEURONTIN) 300 MG capsule Take 300 mg by mouth 3 (three) times daily.    Marland Kitchen lisinopril (ZESTRIL) 10 MG tablet Take 10 mg by mouth daily.    Marland Kitchen MAGNESIUM-OXIDE 400 (241.3 Mg) MG tablet Take 400 mg by mouth daily.   1  . metoprolol tartrate (LOPRESSOR) 50 MG tablet Take 50 mg by mouth 2 (two) times daily.    . Multiple Vitamin (MULTIVITAMIN) tablet Take 1 tablet by mouth daily.    Marland Kitchen omeprazole (PRILOSEC) 40 MG capsule Take 40 mg by mouth daily.    Marland Kitchen oxyCODONE-acetaminophen (PERCOCET) 10-325 MG tablet Take 1 tablet by mouth 3 (three) times daily.    . predniSONE (DELTASONE) 5 MG tablet Take 5 mg by mouth daily.    .  QUEtiapine (SEROQUEL) 25 MG tablet Take 25 mg by mouth at bedtime.   1  . TRELEGY ELLIPTA 100-62.5-25 MCG/INH AEPB Inhale 1 puff into the lungs daily.    Marland Kitchen apixaban (ELIQUIS) 5 MG TABS tablet Take 1 tablet (5 mg total) by mouth 2 (two) times daily. 60 tablet 0  . [EXPIRED] levofloxacin (LEVAQUIN) 500 MG tablet Take 1 tablet (500 mg total) by mouth daily for 10 days. 5 tablet 0  . predniSONE (DELTASONE) 10 MG tablet Take 4 tablets (40 mg total) by mouth daily. 20 tablet 0    OBJECTIVE:  PHYSICAL EXAM  Vitals: Blood pressure 108/78, pulse 89, temperature 98.9 F (37.2 C), temperature source Oral, resp. rate 18, height 5' 2.01" (1.575 m), weight 54.9 kg, SpO2 99 %.. General: Well-developed, Well-nourished in no acute distress Mood: Mood and affect well adjusted, pleasant and cooperative. Orientation: Grossly alert and oriented.  Neck: Supple and symmetric with no palpable masses, tenderness or crepitance. The tracheostomy tube is in place with no evidence of infection or granulation tissue. The cuffed tube was removed and easily replaced a #6 Shiley uncuffed, non-fenestrated tube.  Respiratory: Normal respiratory effort without labored breathing. On O2.  MEDICAL DECISION MAKING: Data Review:  Results for orders placed or performed during the hospital encounter of 06/06/20 (from the past 48 hour(s))  Basic metabolic panel     Status: Abnormal   Collection Time: 08/01/20  4:16 AM  Result Value Ref Range   Sodium 138 135 - 145 mmol/L   Potassium 3.6 3.5 - 5.1 mmol/L   Chloride 94 (L) 98 - 111 mmol/L   CO2 35 (H) 22 - 32 mmol/L   Glucose, Bld 88 70 - 99 mg/dL    Comment: Glucose reference range applies only to samples taken after fasting for at least 8 hours.   BUN 10 8 - 23 mg/dL   Creatinine, Ser 7.42 0.44 - 1.00 mg/dL   Calcium 9.3 8.9 - 59.5 mg/dL   GFR, Estimated >63 >87 mL/min    Comment: (NOTE) Calculated using the CKD-EPI Creatinine Equation (2021)    Anion gap 9 5 - 15     Comment: Performed at [facility], [address] [place], [place] [address]  CBC with Differential/Platelet     Status: Abnormal   Collection Time: 08/01/20  4:16 AM  Result Value Ref Range   WBC 8.1 4.0 -  10.5 K/uL   RBC 3.68 (L) 3.87 - 5.11 MIL/uL   Hemoglobin 10.3 (L) 12.0 - 15.0 g/dL   HCT 41.6 (L) 60.6 - 30.1 %   MCV 91.3 80.0 - 100.0 fL   MCH 28.0 26.0 - 34.0 pg   MCHC 30.7 30.0 - 36.0 g/dL   RDW 60.1 (H) 09.3 - 23.5 %   Platelets 405 (H) 150 - 400 K/uL   nRBC 0.0 0.0 - 0.2 %   Neutrophils Relative % 60 %   Neutro Abs 4.8 1.7 - 7.7 K/uL   Lymphocytes Relative 23 %   Lymphs Abs 1.9 0.7 - 4.0 K/uL   Monocytes Relative 10 %   Monocytes Absolute 0.8 0.1 - 1.0 K/uL   Eosinophils Relative 6 %   Eosinophils Absolute 0.5 0.0 - 0.5 K/uL   Basophils Relative 1 %   Basophils Absolute 0.1 0.0 - 0.1 K/uL   Immature Granulocytes 0 %   Abs Immature Granulocytes 0.03 0.00 - 0.07 K/uL    Comment: Performed at [facility], [address] [place], [place] [address]  Magnesium     Status: None   Collection Time: 08/01/20  4:16 AM  Result Value Ref Range   Magnesium 1.8 1.7 - 2.4 mg/dL    Comment: Performed at [facility], [address] [place], [place] [address]  Glucose, capillary     Status: None   Collection Time: 08/01/20  9:27 AM  Result Value Ref Range   Glucose-Capillary 86 70 - 99 mg/dL    Comment: Glucose reference range applies only to samples taken after fasting for at least 8 hours.  CBC with Differential/Platelet     Status: Abnormal   Collection Time: 08/02/20  3:46 AM  Result Value Ref Range   WBC 7.6 4.0 - 10.5 K/uL   RBC 3.60 (L) 3.87 - 5.11 MIL/uL   Hemoglobin 10.1 (L) 12.0 - 15.0 g/dL   HCT 70.6 (L) 23.7 - 62.8 %   MCV 91.9 80.0 - 100.0 fL   MCH 28.1 26.0 - 34.0 pg   MCHC 30.5 30.0 - 36.0 g/dL   RDW 31.5 17.6 - 16.0 %   Platelets 416 (H) 150 - 400 K/uL   nRBC 0.0 0.0 - 0.2 %   Neutrophils Relative % 56 %   Neutro Abs 4.4  1.7 - 7.7 K/uL   Lymphocytes Relative 27 %   Lymphs Abs 2.1 0.7 - 4.0 K/uL   Monocytes Relative 9 %   Monocytes Absolute 0.7 0.1 - 1.0 K/uL   Eosinophils Relative 6 %   Eosinophils Absolute 0.5 0.0 - 0.5 K/uL   Basophils Relative 1 %   Basophils Absolute 0.1 0.0 - 0.1 K/uL   Immature Granulocytes 1 %   Abs Immature Granulocytes 0.04 0.00 - 0.07 K/uL    Comment: Performed at [facility], [address], [place], [place] [address]  Basic metabolic panel     Status: Abnormal   Collection Time: 08/02/20  3:46 AM  Result Value Ref Range   Sodium 141 135 - 145 mmol/L   Potassium 3.6 3.5 - 5.1 mmol/L   Chloride 95 (L) 98 - 111 mmol/L   CO2 36 (H) 22 - 32 mmol/L   Glucose, Bld 86 70 - 99 mg/dL    Comment: Glucose reference range applies only to samples taken after fasting for at least 8 hours.   BUN 15 8 - 23 mg/dL   Creatinine, Ser 6.26 0.44 - 1.00 mg/dL  Calcium 9.2 8.9 - 10.3 mg/dL   GFR, Estimated >57 >84 mL/min    Comment: (NOTE) Calculated using the CKD-EPI Creatinine Equation (2021)    Anion gap 10 5 - 15    Comment: Performed at [facility], [address], [place], [place] [address]  . No results found..   ASSESSMENT: Status post trach for severe COPD exacerbation. This is her second tracheostomy for this issue. Placement of this trach was complicated by prior scar tissue.  PLAN: I have switched her to a Shiley #6 uncuffed, non-fenestrated tracheostomy tube, so should be later discharged with prescriptions for this as well as replacement inner cannulas and cleaning supplies, home suction. I did discuss with the patient that she should consider keeping the trach long term given her fragile pulmonary status, as this would facilitate future care with the next exacerbation she experiences. Future tracheostomy would be likely more difficult and higher risk due to accumulating scar tissue. I leave this decision ultimately to her pulmonologist, but would strongly  recommend keeping the trach long term.    Sandi Mealy, MD 08/02/2020 5:26 PM

## 2020-08-02 NOTE — Progress Notes (Signed)
PULMONARY PROGRESS NOTE    Name: Virginia [name] MRN: [id number] DOB: 1951-01-14     LOS: 57   SUBJECTIVE FINDINGS & SIGNIFICANT EVENTS    Patient description:  70 yo female with severe COPD admitted with acute on chronic hypoxic hypercapnic respiratory failure and acute encephalopathy secondary to narcotic use and AECOPD requiring mechanical intubation  Now s/p trach/PEG working on trach weaning.  On TC at present, anxious. Remains on precedex.  06/27/20- patient is improved.  Signed out to [facility] - Dr Mayford Knife for pick up in am.   06/29/20- patient is with respiratory distress, she has high volume phlegm from trache, ive suctioned out whats possible via [place] and messaged RT to assist.  RN was able to come in and evaluate patient and she was able to communicate appropriately.   07/07/20- patient is in bed resting. She is clinically better today then yesterday. Resting in bed in no distress.  07/08/20- Patient is lucid and awake.  Son is at bedside we discussed care plan.  CXR in am.   1/16-17/22- patient resting in bed comfortbably no acute events overnight.   07/11/20- patient had episode of mild respiratory distress due to voluminous phlegm expectorated via trache. This was suctioned by RN with recovery. Patient is clear to auscultation with occasional low pitch wheezes.  She is being optimized for d/c.   07/20/20- patient had episode of mild-moderate respiratory distress due to obstructed trache with inspissated phlegm.  She is on 7-8L/min humidifed HFnc.  She is with [employer]ER[employer] on trache aspirate culture. ID is following and there is abscence of leucosytosis or fevers in several days.    07/21/20- patient is with increased O2 requirement this morning up to 10L/min.  She is actually clinically  better in appearance and lung auscultation is improved from yesterday.  She is smiling during interview.  She has PT today and may have increased O2 in preparation for therapy. Plan to continue current care plan.   07/23/20- patient is very frustrated this am due to wanting to be [facility] home. She is able to communicate via [employer] valve.  She shares there are multiple people at home that care for her and she wishes to be dcd home. I have asked her to please cooperate and be patient however she is aggitated and wishes to go home.    07/26/2020- patient is sitting up in chair, she is eating lunch without regurgitation.  Daughter is in room during evaluation.  Patient is vocalizing well with PMV.  She is requesting reversal of PEG tube and dc home. She remains on 8L/min [place].  She should have PT/OT/CPT  07/27/20- patient is stable however on 8-10L with trache collar.  She is eating regular food despite trach and has passed thorough speech evaluation.  She requests removal of her PEG tube and has not used it.  Have discussed with Dr. Lucianne Muss and we are working on accommodating patient's request.  She continues to work with PT and OT.  She requests to be discharged home after medical optimization.  07/28/20- patient is imporved, she is down to 5L/min.  She ambulated well today with PT.  She has requested reversal of PEG.     07/29/2020- patient improved, O2 weaned to 5L/min, worked with PT/OT today.  She is improving and vocalizing well, feeling more confident and stronger.  Wants to go home after PEG removed/reversed.   07/30/2020- patient resting in bed in no distress.  Vitals are stable. No overnight  events.    08/01/2020- patient should have cuffed trache removed.  Discussed case with attending physician, case manager , ENT, SLP team and plan is to have NIV set up on outpatient with d/c home.  She has chronic respiratory failure but is now eating with trache and vocalizing with PMV.  She will follow up at  [facility] pulmonary with Dr Meredeth Ide.   PHYSICAL EXAMINATION   Vital Signs: Temp:  [97.8 F (36.6 C)-99.5 F (37.5 C)] 98.9 F (37.2 C) (02/09 1625) Pulse Rate:  [64-96] 89 (02/09 1625) Resp:  [16-20] 18 (02/09 1625) BP: (101-131)/(70-89) 108/78 (02/09 1625) SpO2:  [91 %-99 %] 99 % (02/09 1625) FiO2 (%):  [28 %] 28 % (02/09 1432)   FiO2 (%):  [28 %] 28 %   Constitutional: anxious frail woman lying in bed  Eyes: eomi, pupils equal Ears, nose, mouth, and throat: trach in place, minmal secretions Cardiovascular: RRR,  Ext warm Respiratory: diminished with prolonged expiratory phase, occasional accessory muscle use Gastrointestinal: soft, abd wrapped after PEG, some mild tenderness at PEG insertion site MSK: muscle wasting Skin: No rashes, normal turgor, scattered bruising Neurologic: moves all 4 ext to command, weak Psychiatric: anxious, answering questions appropriately   Scheduled Meds: . acetylcysteine  4 mL Nebulization TID  . acidophilus  1 capsule Oral TID  . apixaban  5 mg Oral BID  . arformoterol  15 mcg Nebulization BID  . clonazePAM  0.5 mg Oral BID  . feeding supplement  237 mL Oral TID BM  . fentaNYL  1 patch Transdermal Q72H  . gabapentin  300 mg Oral TID  . metoprolol tartrate  50 mg Oral BID  . [START ON 08/03/2020] multivitamin with minerals  1 tablet Oral Daily  . nystatin   Topical TID  . pantoprazole  40 mg Oral QHS  . QUEtiapine  25 mg Oral QHS   Continuous Infusions:  PRN Meds:.acetaminophen (TYLENOL) oral liquid 160 mg/5 mL, diazepam, ipratropium-albuterol, labetalol, naphazoline-glycerin, ondansetron (ZOFRAN) IV    ASSESSMENT AND PLAN   Acute on chronic hypoxemic and hypercapnic respiratory failure thought secondary to polypharmacy long wean from ventilator due to muscular deconditioning now s/p tracheostomy.         -Heavy phlegm production - Glycopyrrolate tid 1mg -07/21/20  Dysphagia, moderate protein calorie malnutrition- now s/p  PEG  Severe baseline COPD- complicated trach wean - continue COPD carepath  Muscular deconditioning-PT/OT/CPT  Chronic anxiety/depression  Metabolic encephalopathy/agitation- improving  - repeat tracheal aspirate ordered -ENTEROBACTER AEROGENES - Switched from standing duonebs from duonebs to brovana/yupelri - PT/OT up to chair - Continue standing clonazepam, use valium per tube for breakthrough - SLP for PMV trials - g off IV meds for rehab vs. SNF vs. LTACH vs home?    Patient chronically critically ill due to respiratory failure, metabolic encephalopathy Interventions to address this today weaning precedex, weaning vent Risk of deterioration without these interventions is high    Patient would benefit from [facility]. *This note was dictated using voice recognition software/Dragon.  Despite best efforts to proofread, errors can occur which can change the meaning.  Any change was purely unintentional.     [facility], M.D.  Pulmonary & Critical Care Medicine  [facility]

## 2020-08-02 NOTE — Progress Notes (Signed)
PROGRESS NOTE    Virginia GRISSETT  YKD:[id number] DOB: 04/17/1951 DOA: 06/06/2020 PCP: Marguarite Arbour, MD   Chief complaint.  Shortness of breath Brief Narrative:  This 70 years old female with severe COPD admitted with acute on chronic hypoxic and hypercapnic respiratory failure and acute encephalopathy secondary to narcotic use and advanced COPD requiring mechanical ventilation. She was treated with steroids and IV antibioticsin the ICU. She could not be liberated from the ventilator so tracheostomy was performed and she has been receiving oxygen via trach collar.She had prolonged ICU course.  PCCM pick up 1/14.Patient developed worsening hypoxia on 1/15. Chest x-ray shows bilateral lower lobe infiltrate consistent with aspiration pneumonia. She was started andhas completed antibiotics.  On 1/20:patient had a sudden onset of altered mental status, ABG shows severe metabolic acidosis. Patient was transferred to ICU.  PCCM pickup 1/24: Acute encephalopathy resolved. CT head unremarkable for any acute stroke or bleeding. Mental status has normalized without any focal deficit. COPD appears stable.  1/26->transfer to med-surg. Waiting for LTAC decision. 1/27->LTAC declined. Waiting for SNF placement. 1/28->Oxygen requirement up to 10 L after PT/OT session.  1/29: >RRT called, O2 saturation dropped to 70s. Found to have mucous plugging in the trach which was removed, O2 saturation improved. 1/30- >Patient appears improved. She is lying comfortably. Oxygen requirement via trach 10 L. 1/31->Given severe baseline COPD, would be difficult to wean trach.  2/1- >Difficult placement in LTAC. TOC working to find place[place] [facility] [place] Awaiting response. 2/2: >Patient passed MBS,Started on dysphagia 2 diet. She is tolerating well. Patient is requesting to remove the PEG tube before discharge. 2/3: >Reached out to GI Dr.  Mia Creek about removing the PEG tube. Peg Tube be removed next week. 2/4>Oxygen requirement vial Trach @ 8L. Participated in Physical Therapy, HHPT recommended.   Assessment & Plan:   Active Problems:   AF (paroxysmal atrial fibrillation) (HCC)   Anemia of chronic disease   COPD with acute lower respiratory infection (HCC)   Dysphagia   Respiratory failure (HCC)   COPD exacerbation (HCC)   Acute urinary retention   Acute metabolic encephalopathy   Acute on chronic respiratory failure with hypoxia and hypercapnia (HCC)   Aspiration pneumonia of both lower lobes due to gastric secretions (HCC)   Reactive thrombocytosis   Pressure injury of skin   Diarrhea  #1. Acute on chronic respiratory failure with hypoxemia and hypercapnia. Status post tracheostomy and PEG tube placement. COPD exacerbation. Aspiration pneumonia secondary to Enterobacter aerogenes. Dysphagia. Patient condition improving.  Discussed with the nurse, patient still needing 6-7 suctions per day for airway secretion. We will continue as needed suction.  Patient does not have additional pneumonia. Started Mucomyst, patient was tolerating it.  We will continue. Discussed with Dr. Mia Creek, PEG tube can be removed at bedside or as outpatient in the office.  She will talk to the patient today and make a decision.  #2.  Acute metabolic encephalopathy. Severe metabolic acidosis. Conditions improved.  3.  Paroxysmal atrial fibrillation. Eliquis on hold, pending decision to remove PEG tube.    DVT prophylaxis: SCDs Code Status: Full Family Communication:  Disposition Plan:  .   Status is: Inpatient  Remains inpatient appropriate because:Inpatient level of care appropriate due to severity of illness   Dispo: The patient is from: Home              Anticipated d/c is to: Home  Anticipated d/c date is: 3 days              Patient currently is not medically stable to d/c.   Difficult to place  patient Yes        I/O last 3 completed shifts: In: 477 [P.O.:477] Out: 251 [Urine:250; Stool:1] No intake/output data recorded.     Consultants:   GI  Procedures:   Antimicrobials: None  Subjective: Patient still has significant amount of airway secretion, she still requiring 6-7 times a suction per day.  But short of breath had improved.  Her cough is  stronger, she also learned how to suction herself. No fever or chills. She has been having normal bowel movements without nausea vomiting or abdominal pain. No dysuria hematuria.  Objective: Vitals:   08/01/20 2019 08/01/20 2343 08/02/20 0411 08/02/20 0801  BP: 131/89 103/72 101/70   Pulse: 96 66 64 73  Resp: 20 20 20 19   Temp: 99.5 F (37.5 C) 98.4 F (36.9 C) 98.5 F (36.9 C)   TempSrc:   Oral   SpO2: 93% 96% 96% 94%  Weight:      Height:        Intake/Output Summary (Last 24 hours) at 08/02/2020 0907 Last data filed at 08/01/2020 1800 Gross per 24 hour  Intake 237 ml  Output 250 ml  Net -13 ml   Filed Weights   07/16/20 0500 07/17/20 0426 07/18/20 0500  Weight: 56.4 kg 55.4 kg 54.9 kg    Examination:  General exam: Appears calm and comfortable  Respiratory system: Decreased breathing sounds. Respiratory effort normal. Cardiovascular system: S1 & S2 heard, RRR. No JVD, murmurs, rubs, gallops or clicks. No pedal edema. Gastrointestinal system: Abdomen is nondistended, soft and nontender. No organomegaly or masses felt. Normal bowel sounds heard. Central nervous system: Alert and oriented. No focal neurological deficits. Extremities: Symmetric  Skin: No rashes, lesions or ulcers Psychiatry: Judgement and insight appear normal. Mood & affect appropriate.     Data Reviewed: I have personally reviewed following labs and imaging studies  CBC: Recent Labs  Lab 07/27/20 0447 07/28/20 0509 07/30/20 0450 08/01/20 0416 08/02/20 0346  WBC 9.1 7.4 6.9 8.1 7.6  NEUTROABS  --   --   --  4.8 4.4  HGB  9.5* 9.8* 9.7* 10.3* 10.1*  HCT 32.6* 32.6* 31.4* 33.6* 33.1*  MCV 96.4 95.0 90.0 91.3 91.9  PLT 357 345 389 405* 416*   Basic Metabolic Panel: Recent Labs  Lab 07/28/20 0509 08/01/20 0416 08/02/20 0346  NA 139 138 141  K 4.5 3.6 3.6  CL 90* 94* 95*  CO2 39* 35* 36*  GLUCOSE 84 88 86  BUN 16 10 15   CREATININE 0.66 0.72 0.86  CALCIUM 9.5 9.3 9.2  MG 1.9 1.8  --   PHOS 3.9  --   --    GFR: Estimated Creatinine Clearance: 48.8 mL/min (by C-G formula based on SCr of 0.86 mg/dL). Liver Function Tests: No results for input(s): AST, ALT, ALKPHOS, BILITOT, PROT, ALBUMIN in the last 168 hours. No results for input(s): LIPASE, AMYLASE in the last 168 hours. No results for input(s): AMMONIA in the last 168 hours. Coagulation Profile: No results for input(s): INR, PROTIME in the last 168 hours. Cardiac Enzymes: No results for input(s): CKTOTAL, CKMB, CKMBINDEX, TROPONINI in the last 168 hours. BNP (last 3 results) No results for input(s): PROBNP in the last 8760 hours. HbA1C: No results for input(s): HGBA1C in the last 72 hours. CBG: Recent Labs  Lab 08/01/20 0927  GLUCAP 86   Lipid Profile: No results for input(s): CHOL, HDL, LDLCALC, TRIG, CHOLHDL, LDLDIRECT in the last 72 hours. Thyroid Function Tests: No results for input(s): TSH, T4TOTAL, FREET4, T3FREE, THYROIDAB in the last 72 hours. Anemia Panel: No results for input(s): VITAMINB12, FOLATE, FERRITIN, TIBC, IRON, RETICCTPCT in the last 72 hours. Sepsis Labs: No results for input(s): PROCALCITON, LATICACIDVEN in the last 168 hours.  No results found for this or any previous visit (from the past 240 hour(s)).       Radiology Studies: No results found.      Scheduled Meds: . acetylcysteine  4 mL Nebulization TID  . acidophilus  1 capsule Oral TID  . arformoterol  15 mcg Nebulization BID  . clonazePAM  1 mg Oral BID  . feeding supplement  237 mL Oral BID BM  . fentaNYL  1 patch Transdermal Q72H  .  gabapentin  300 mg Oral TID  . metoprolol tartrate  50 mg Oral BID  . nystatin   Topical TID  . pantoprazole  40 mg Oral QHS  . QUEtiapine  25 mg Oral QHS   Continuous Infusions:   LOS: 57 days    Time spent: 28 minutes    Marrion Coy, MD [facility]   To contact the attending provider between 7A-7P or the covering provider during after hours 7P-7A, please log into the web site [email or web address] and access using universal [facility] password for that web site. If you do not have the password, please call the hospital operator.  08/02/2020, 9:07 AM

## 2020-08-02 NOTE — Care Plan (Signed)
Talked to patient and son. Agree to leave PEG tube in for to optimize nutrition. Can be removed in the office after discharge. Please call with any questions or concerns.  Merlyn Lot MD, MPH [facility]

## 2020-08-02 NOTE — TOC Progression Note (Addendum)
Transition of Care [facility]) - Progression Note    Patient Details  Name: EVANGELENE VORA MRN: [id number] Date of Birth: September 14, 1950  Transition of Care [facility]) CM/SW [place], LCSW Phone Number: 08/02/2020, 12:12 PM  Clinical Narrative: CSW met with patient. Provided paperwork for her PCP to fill out in order for her to get more PCS hours. Called daughter-in-law to make her aware. Discussed plan for ordering oxygen and trach supplies. Patient was already on 24/7 oxygen prior to admission through [facility]. CSW spoke with [employer] representative to make him aware of need. He will need oxygen/trach supply orders at least 2 days ahead of time so we can get everything delivered in time. He will check to see what they already have available and follow up. Sent secure chat to Dr. Roosevelt Locks and Dr. Lanney Gins to notify. Will start home health workup. Waiting on call back from [facility].  2:50 pm: [facility], [facility], and [facility] unable to accept referral. Jackquline Denmark is reviewing.    Barriers to Discharge: Continued Medical Work up  Expected Discharge Plan and Services   In-house Referral: Clinical Social Work,Hospice / Palliative Care   Post Acute Care Choice: Durable Medical Equipment (Oxygen 4L) Living arrangements for the past 2 months: Apartment                                       Social Determinants of Health (SDOH) Interventions    Readmission Risk Interventions No flowsheet data found.

## 2020-08-02 NOTE — Plan of Care (Signed)
  Problem: Clinical Measurements: Goal: Respiratory complications will improve Outcome: Progressing Goal: Cardiovascular complication will be avoided Outcome: Progressing   Problem: Activity: Goal: Risk for activity intolerance will decrease Outcome: Progressing   Problem: Nutrition: Goal: Adequate nutrition will be maintained Outcome: Progressing   Problem: Coping: Goal: Level of anxiety will decrease Outcome: Progressing   Problem: Pain Managment: Goal: General experience of comfort will improve Outcome: Progressing   Problem: Safety: Goal: Ability to remain free from injury will improve Outcome: Progressing   Problem: Skin Integrity: Goal: Risk for impaired skin integrity will decrease Outcome: Progressing

## 2020-08-02 NOTE — Progress Notes (Signed)
SLP Cancellation Note  Patient Details Name: AFRIKA BRICK MRN: [id number] DOB: February 19, 1951   Cancelled treatment:       Reason Eval/Treat Not Completed:  (chart reviewed). MDs consulted including ENT and Pulmonology re: pt going home w/ a cuffed trach. Due to pt going home w/ the Trilogy vent for support, pt is not a candidate for changing out to a cuffless trach at this time; not recommended by ENT. Pt will f/u w/ ENT Outpatient post d/c home.  Per chart notes, pt's diet has been updated to a Regular diet per her and MD's decision.  Pt has been educated by ST services on general aspiration precautions and that she MUST wear her PMV for all oral intake. She has been educated on the care/use of the PMV and to check for full cuff deflation b/f placing the PMV. She stated she will not wear the PMV when sleeping. Handouts given re: PMV use/care. ST services will sign off at this time. NSG to reconsult if any New needs arise.      Jerilynn Som, MS, CCC-SLP Speech Language Pathologist Rehab Services [phone] [facility] 08/02/2020, 5:31 PM

## 2020-08-02 NOTE — Progress Notes (Signed)
Nutrition Follow-up  DOCUMENTATION CODES:   Not applicable  INTERVENTION:   Ensure Enlive po TID, each supplement provides 350 kcal and 20 grams of protein  Magic cup TID with meals, each supplement provides 290 kcal and 9 grams of protein  MVI daily   Liberalize diet   48 hour calorie count  Pt at high refeed risk; recommend monitor potassium, magnesium and phosphorus labs daily until stable  NUTRITION DIAGNOSIS:   Increased nutrient needs related to catabolic illness (COPD) as evidenced by estimated needs. Ongoing.  GOAL:   Patient will meet greater than or equal to 90% of their needs -not met   MONITOR:   PO intake,Supplement acceptance,Labs,Weight trends,Skin,I & O's  ASSESSMENT:   70 year old female with PMHx of COPD, hx tracheostomy tube placement and PEG tube placement in 2017, HTN, depression, RLS, osteoporosis, hepatitis C, A-fib, anxiety, arthritis admitted with acute on chronic hypoxic hypercapnic respiratory failure and acute encephalopathy secondary to narcotic use and acute exacerbation of COPD.   Pt has continued to have poor appetite and oral intake in hospital; pt eating only sips and bites of most meals and is refusing Ensure supplements. Pt has continued to refuse her tube feeds and has repeatedly requested that PEG tube be removed. RD has spoken to patient and MD multiple times regarding the fact that patient is not eating enough to meet her needs and should continue tube feeds. Pt will agree to continue feeds when talking to her but then will refuse to let RN provide the feeds. RD has tried to encourage Ensure supplements as well but pt continues to refuse. GI met with patient and son today and it was decided to use PEG tube to maximize nutrition but pt still is requesting to eat and not use the tube. At minimum Ensure can be given down the tube to help pt meet her estimated needs but a balanced tube feed formula would be more ideal. Spoke with MD again  today, plan is for 48 hour calorie count to determine exactly how much patient is eating. MD to speak with patient to encourage intake of supplements. RD will add supplements and vitamins to help pt meet her estimated needs. RD will also liberalize pt's diet. If patient is unable to meet her estimated needs via oral intake, RD continues to recommend use of existing PEG tube to help maximize nutrition.    Per chart, pt is down ~9lbs since admit but appears to be close to her UBW.   Medications reviewed and include: risaquad, protonix  Labs reviewed: K 3.6 wnl P 3.9 wnl- 2/4 Mg 1.8 wnl- 2/8 Hgb 10.1(L), Hct 33.1(L)  Diet Order:   Diet Order            Diet Heart Room service appropriate? Yes; Fluid consistency: Thin  Diet effective now                EDUCATION NEEDS:   No education needs have been identified at this time  Skin:  Skin Assessment: Skin Integrity Issues: Skin Integrity Issues:: Other (Comment),Stage II Stage II: left buttocks Incisions: N/A Other: MASD to groin and perineum  Last BM:  2/8- type 5  Height:   Ht Readings from Last 1 Encounters:  07/14/20 5' 2.01" (1.575 m)   Weight:   Wt Readings from Last 1 Encounters:  07/18/20 54.9 kg   Ideal Body Weight:  50 kg  BMI:  Body mass index is 22.13 kg/m.  Estimated Nutritional Needs:  Kcal:  1500-1700kcal/day  Protein:  75-85g/day  Fluid:  1.5-1.7 L/day  Koleen Distance MS, RD, LDN Please refer to [facility] for RD and/or RD on-call/weekend/after hours pager

## 2020-08-03 DIAGNOSIS — J9621 Acute and chronic respiratory failure with hypoxia: Secondary | ICD-10-CM | POA: Diagnosis not present

## 2020-08-03 DIAGNOSIS — G9341 Metabolic encephalopathy: Secondary | ICD-10-CM | POA: Diagnosis not present

## 2020-08-03 DIAGNOSIS — J9622 Acute and chronic respiratory failure with hypercapnia: Secondary | ICD-10-CM | POA: Diagnosis not present

## 2020-08-03 DIAGNOSIS — I480 Paroxysmal atrial fibrillation: Secondary | ICD-10-CM | POA: Diagnosis not present

## 2020-08-03 MED ORDER — DIAZEPAM 5 MG PO TABS
5.0000 mg | ORAL_TABLET | Freq: Four times a day (QID) | ORAL | Status: DC | PRN
  Administered 2020-08-03 – 2020-08-05 (×6): 5 mg via ORAL
  Filled 2020-08-03 (×6): qty 1

## 2020-08-03 MED ORDER — OXYCODONE-ACETAMINOPHEN 5-325 MG PO TABS
1.0000 | ORAL_TABLET | Freq: Three times a day (TID) | ORAL | Status: DC | PRN
  Administered 2020-08-03 – 2020-08-05 (×3): 1 via ORAL
  Filled 2020-08-03 (×3): qty 1

## 2020-08-03 MED ORDER — OXYCODONE-ACETAMINOPHEN 10-325 MG PO TABS: 1.0000 | ORAL_TABLET | Freq: Three times a day (TID) | ORAL | Status: DC | PRN

## 2020-08-03 MED ORDER — OXYCODONE HCL 5 MG PO TABS: 5.0000 mg | ORAL_TABLET | Freq: Three times a day (TID) | ORAL | Status: DC | PRN

## 2020-08-03 MED ORDER — NYSTATIN 100000 UNIT/GM EX POWD
Freq: Three times a day (TID) | CUTANEOUS | Status: DC | PRN
  Filled 2020-08-03: qty 15

## 2020-08-03 MED ORDER — ACETAMINOPHEN 160 MG/5ML PO SOLN
650.0000 mg | ORAL | Status: DC | PRN
  Administered 2020-08-09: 650 mg via ORAL
  Filled 2020-08-03 (×3): qty 20.3

## 2020-08-03 NOTE — Progress Notes (Signed)
Physical Therapy Treatment Patient Details Name: Virginia [name] MRN: [id number] DOB: 07-Oct-1950 Today's Date: 08/03/2020    History of Present Illness 70 yo female with severe COPD admitted with acute on chronic hypoxic hypercapnic respiratory failure and acute encephalopathy secondary to narcotic use and AECOPD requiring mechanical intubation. Initially intubated from 12/14-12/16 and then again from 12/18-12/23. Now with + trach collar. Recent hospitalization from 12/10-12/12/21. RR secondary to slurred speech and AMS. Transferred to CCU on 1/20 on full vent support. RE-evaluation performed this date as medical status has improved.    PT Comments    Pt was long sitting in bed with 10 L HFNC on and sao2 100%. Pt stated" I thought they put me back down." Did decrease HFNC to 6L for ambulation. Pt did not tolerate ambulation as well today as previous PT session. She did desaturate to 74% and required increase to 15L to safely return to her room. Several minutes to recover to >88%. Visibly anxious with elevated RR. Author feels pt tolerated ambulation with increased difficulty due to pt ambulating with PMV on. Will compare ambulation with and without PMV next session. Pt too fatigue after one trial of gait training. Will return on 08/04/20. Pt is planning to return home with [facility] services at DC. Acute PT will continue to follow and progress per POC.    Follow Up Recommendations  Home health PT;Supervision/Assistance - 24 hour;Other (comment) (Once pt able to maintain safe O2 levels during activity)     Equipment Recommendations  None recommended by PT    Recommendations for Other Services       Precautions / Restrictions Precautions Precautions: Fall Restrictions Weight Bearing Restrictions: No    Mobility  Bed Mobility Overal bed mobility: Modified Independent     Transfers Overall transfer level: Needs assistance Equipment used: Rolling walker (2 wheeled) Transfers: Sit to/from  Stand Sit to Stand: Supervision         General transfer comment: Only needs assistance to maintain lead/lines  Ambulation/Gait Ambulation/Gait assistance: Min guard Gait Distance (Feet): 75 Feet Assistive device: Rolling walker (2 wheeled) Gait Pattern/deviations: Step-through pattern Gait velocity: decreased   General Gait Details: Pt started ambulation on 6L trach collar however did have to increase to 12 L due to severe desaturation. Pt did not tolerate ambulation as well today as previous session observed.      Balance Overall balance assessment: Needs assistance Sitting-balance support: Feet supported Sitting balance-Leahy Scale: Good Sitting balance - Comments: no balance deficits sitting EOB   Standing balance support: Bilateral upper extremity supported;During functional activity Standing balance-Leahy Scale: Good          Cognition Arousal/Alertness: Awake/alert Behavior During Therapy: WFL for tasks assessed/performed Overall Cognitive Status: Within Functional Limits for tasks assessed        General Comments: Pt was A and O x 4. Agreeable to session and motivated to improve. does state" I will go home on monday."             Pertinent Vitals/Pain Pain Assessment: No/denies pain Pain Score: 0-No pain     PT Goals (current goals can now be found in the care plan section) Acute Rehab PT Goals Patient Stated Goal: To return home safely Progress towards PT goals: Progressing toward goals    Frequency    Min 2X/week      PT Plan Current plan remains appropriate    Co-evaluation     PT goals addressed during session: Mobility/safety with mobility  AM-PAC PT "6 Clicks" Mobility   Outcome Measure  Help needed turning from your back to your side while in a flat bed without using bedrails?: None Help needed moving from lying on your back to sitting on the side of a flat bed without using bedrails?: None Help needed moving to and from  a bed to a chair (including a wheelchair)?: A Little Help needed standing up from a chair using your arms (e.g., wheelchair or bedside chair)?: A Little Help needed to walk in hospital room?: A Little Help needed climbing 3-5 steps with a railing? : A Little 6 Click Score: 20    End of Session Equipment Utilized During Treatment: Gait belt;Oxygen Activity Tolerance: Patient tolerated treatment well Patient left: in bed;with call bell/phone within reach;with bed alarm set Nurse Communication: Mobility status PT Visit Diagnosis: Unsteadiness on feet (R26.81);Muscle weakness (generalized) (M62.81);Difficulty in walking, not elsewhere classified (R26.2)     Time: 6295-2841 PT Time Calculation (min) (ACUTE ONLY): 27 min  Charges:  $Gait Training: 8-22 mins $Therapeutic Activity: 8-22 mins                     Jetta Lout PTA 08/04/20, 8:13 AM

## 2020-08-03 NOTE — Progress Notes (Signed)
PULMONARY PROGRESS NOTE    Name: Virginia [name] MRN: [id number] DOB: 09-10-1950     LOS: 58   SUBJECTIVE FINDINGS & SIGNIFICANT EVENTS    Patient description:  70 yo female with severe COPD admitted with acute on chronic hypoxic hypercapnic respiratory failure and acute encephalopathy secondary to narcotic use and AECOPD requiring mechanical intubation  Now s/p trach/PEG working on trach weaning.  On TC at present, anxious. Remains on precedex.  06/27/20- patient is improved.  Signed out to [facility] - Dr Mayford Knife for pick up in am.   06/29/20- patient is with respiratory distress, she has high volume phlegm from trache, ive suctioned out whats possible via [place] and messaged RT to assist.  RN was able to come in and evaluate patient and she was able to communicate appropriately.   07/07/20- patient is in bed resting. She is clinically better today then yesterday. Resting in bed in no distress.  07/08/20- Patient is lucid and awake.  Son is at bedside we discussed care plan.  CXR in am.   1/16-17/22- patient resting in bed comfortbably no acute events overnight.   07/11/20- patient had episode of mild respiratory distress due to voluminous phlegm expectorated via trache. This was suctioned by RN with recovery. Patient is clear to auscultation with occasional low pitch wheezes.  She is being optimized for d/c.   07/20/20- patient had episode of mild-moderate respiratory distress due to obstructed trache with inspissated phlegm.  She is on 7-8L/min humidifed HFnc.  She is with ENTEROBACTER AEROGENES on trache aspirate culture. ID is following and there is abscence of leucosytosis or fevers in several days.    07/21/20- patient is with increased O2 requirement this morning up to 10L/min.  She is actually clinically  better in appearance and lung auscultation is improved from yesterday.  She is smiling during interview.  She has PT today and may have increased O2 in preparation for therapy. Plan to continue current care plan.   07/23/20- patient is very frustrated this am due to wanting to be [facility] home. She is able to communicate via [employer] valve.  She shares there are multiple people at home that care for her and she wishes to be dcd home. I have asked her to please cooperate and be patient however she is aggitated and wishes to go home.    07/26/2020- patient is sitting up in chair, she is eating lunch without regurgitation.  Daughter is in room during evaluation.  Patient is vocalizing well with PMV.  She is requesting reversal of PEG tube and dc home. She remains on 8L/min [place].  She should have PT/OT/CPT  07/27/20- patient is stable however on 8-10L with trache collar.  She is eating regular food despite trach and has passed thorough speech evaluation.  She requests removal of her PEG tube and has not used it.  Have discussed with Dr. Lucianne Muss and we are working on accommodating patient's request.  She continues to work with PT and OT.  She requests to be discharged home after medical optimization.  07/28/20- patient is imporved, she is down to 5L/min.  She ambulated well today with PT.  She has requested reversal of PEG.     07/29/2020- patient improved, O2 weaned to 5L/min, worked with PT/OT today.  She is improving and vocalizing well, feeling more confident and stronger.  Wants to go home after PEG removed/reversed.   07/30/2020- patient resting in bed in no distress.  Vitals are stable. No overnight  events.    08/01/2020- patient should have cuffed trache removed.  Discussed case with attending physician, case manager , ENT, SLP team and plan is to have NIV set up on outpatient with d/c home.  She has chronic respiratory failure but is now eating with trache and vocalizing with PMV.  She will follow up at  [facility] pulmonary with Dr Meredeth Ide.   08/03/2020- patient is improved.  I have filled out paperwork for NIV trilogy.  Patient wishes to be discharged home as soon as possible, currently she is improved and lucid able to vocalize with PMV well.  She wishes to have trache removed if possible.   PHYSICAL EXAMINATION   Vital Signs: Temp:  [98 F (36.7 C)-98.9 F (37.2 C)] 98 F (36.7 C) (02/10 0922) Pulse Rate:  [67-92] 92 (02/10 0922) Resp:  [16-19] 18 (02/10 0922) BP: (91-129)/(56-86) 129/86 (02/10 0922) SpO2:  [84 %-100 %] 84 % (02/10 0922) FiO2 (%):  [28 %-40 %] 40 % (02/10 0740)   FiO2 (%):  [28 %-40 %] 40 %   Constitutional: anxious frail woman lying in bed  Eyes: eomi, pupils equal Ears, nose, mouth, and throat: trach in place, minmal secretions Cardiovascular: RRR,  Ext warm Respiratory: diminished with prolonged expiratory phase, occasional accessory muscle use Gastrointestinal: soft, abd wrapped after PEG, some mild tenderness at PEG insertion site MSK: muscle wasting Skin: No rashes, normal turgor, scattered bruising Neurologic: moves all 4 ext to command, weak Psychiatric: anxious, answering questions appropriately   Scheduled Meds: . acetylcysteine  4 mL Nebulization TID  . acidophilus  1 capsule Oral TID  . apixaban  5 mg Oral BID  . arformoterol  15 mcg Nebulization BID  . clonazePAM  0.5 mg Oral BID  . feeding supplement  237 mL Oral TID BM  . gabapentin  300 mg Oral TID  . metoprolol tartrate  50 mg Oral BID  . multivitamin with minerals  1 tablet Oral Daily  . nystatin   Topical TID  . pantoprazole  40 mg Oral QHS  . QUEtiapine  25 mg Oral QHS   Continuous Infusions:  PRN Meds:.acetaminophen (TYLENOL) oral liquid 160 mg/5 mL, diazepam, ipratropium-albuterol, labetalol, naphazoline-glycerin, ondansetron (ZOFRAN) IV, oxyCODONE **AND** oxyCODONE-acetaminophen    ASSESSMENT AND PLAN   Acute on chronic hypoxemic and hypercapnic respiratory failure  thought secondary to polypharmacy long wean from ventilator due to muscular deconditioning now s/p tracheostomy.         -Heavy phlegm production - Glycopyrrolate tid 1mg -07/21/20  Dysphagia, moderate protein calorie malnutrition- now s/p PEG  Severe baseline COPD- complicated trach wean - continue COPD carepath  Muscular deconditioning-PT/OT/CPT  Chronic anxiety/depression  Metabolic encephalopathy/agitation- improving  - repeat tracheal aspirate ordered -ENTEROBACTER AEROGENES - Switched from standing duonebs from duonebs to brovana/yupelri - PT/OT up to chair - Continue standing clonazepam, use valium per tube for breakthrough - SLP for PMV trials - g off IV meds for rehab vs. SNF vs. LTACH vs home?    Patient chronically critically ill due to respiratory failure, metabolic encephalopathy Interventions to address this today weaning precedex, weaning vent Risk of deterioration without these interventions is high    Patient would benefit from [facility]. *This note was dictated using voice recognition software/Dragon.  Despite best efforts to proofread, errors can occur which can change the meaning.  Any change was purely unintentional.     [facility], M.D.  Pulmonary & Critical Care Medicine  [facility]

## 2020-08-03 NOTE — Progress Notes (Signed)
PROGRESS NOTE    Virginia [name]  MOQ:[id number] DOB: 16-Nov-1950 DOA: 06/06/2020 PCP: Marguarite Arbour, MD   Chief complaint.  Shortness of breath. Brief Narrative:  This 70 years old female with severe COPD admitted with acute on chronic hypoxic and hypercapnic respiratory failure and acute encephalopathy secondary to narcotic use and advanced COPD requiring mechanical ventilation. She was treated with steroids and IV antibioticsin the ICU. She could not be liberated from the ventilator so tracheostomy was performed and she has been receiving oxygen via trach collar.She had prolonged ICU course.  PCCM pick up 1/14.Patient developed worsening hypoxia on 1/15. Chest x-ray shows bilateral lower lobe infiltrate consistent with aspiration pneumonia. She was started andhas completed antibiotics.  On 1/20:patient had a sudden onset of altered mental status, ABG shows severe metabolic acidosis. Patient was transferred to ICU.  PCCM pickup 1/24: Acute encephalopathy resolved. CT head unremarkable for any acute stroke or bleeding. Mental status has normalized without any focal deficit. COPD appears stable.  1/26->transfer to med-surg. Waiting for LTAC decision. 1/27->LTAC declined. Waiting for SNF placement. 1/28->Oxygen requirement up to 10 L after PT/OT session.  1/29: >RRT called, O2 saturation dropped to 70s. Found to have mucous plugging in the trach which was removed, O2 saturation improved. 1/30- >Patient appears improved. She is lying comfortably. Oxygen requirement via trach 10 L. 1/31->Given severe baseline COPD, would be difficult to wean trach.  2/1- >Difficult placement in LTAC. TOC working to find place[place] [facility] [place] Awaiting response. 2/2: >Patient passed MBS,Started on dysphagia 2 diet. She is tolerating well. Patient is requesting to remove the PEG tube before discharge. 2/3: >Reached out to GI Dr.  Mia Creek about removing the PEG tube. Peg Tube be removed next week. 2/4>Oxygen requirement vial Trach @ 8L. Participated in Physical Therapy, HHPT recommended.    Assessment & Plan:   Active Problems:   AF (paroxysmal atrial fibrillation) (HCC)   Anemia of chronic disease   COPD with acute lower respiratory infection (HCC)   Dysphagia   Respiratory failure (HCC)   COPD exacerbation (HCC)   Acute urinary retention   Acute metabolic encephalopathy   Acute on chronic respiratory failure with hypoxia and hypercapnia (HCC)   Aspiration pneumonia of both lower lobes due to gastric secretions (HCC)   Reactive thrombocytosis   Pressure injury of skin   Diarrhea  #1. Acute on chronic respiratory failure with hypoxemia and hypercapnia. Status post tracheostomy and PEG tube placement. COPD exacerbation. Aspiration pneumonia secondary to Enterobacter aerogenes. Dysphagia. Patient condition had improved.  Tracheostomy has been changed to noncuffed tube per recommendation from pulmonology. PEG tube will not be removed at this time per GI. Patient and the family wished to go home.  At this point, we need to address 2 problems.  Patient appetite is still poor, dietitian has started a calorie count.  If not able to take sufficient calorie, may restart tube feeding again with Ensure 3 times a day in addition to oral intake. Patient still has significant airway secretion, receiving Mucomyst.  Hopefully, mucus secretion will gradually improve. A trilogy machine is also pending. Plan to discharge patient on Monday.  #2 pure acute metabolic encephalopathy. Severe metabolic acidosis. Condition improved  3.  Paroxysmal atrial fibrillation. Restarted Eliquis.    DVT prophylaxis: Eliquis Code Status: Full Family Communication:  Disposition Plan:  .   Status is: Inpatient  Remains inpatient appropriate because:Inpatient level of care appropriate due to severity of illness   Dispo:  The patient is from: Home              Anticipated d/c is to: Home              Anticipated d/c date is: > 3 days              Patient currently is not medically stable to d/c.   Difficult to place patient Yes        I/O last 3 completed shifts: In: 910 [P.O.:910] Out: 200 [Urine:200] No intake/output data recorded.     Consultants:   Pulm, ENT  Procedures:   Antimicrobials: None  Subjective: Patient is gradually improving, still has significant airway secretion requiring suctions.  Cough is stronger.  Patient is more comfortable to perform suctioning herself. No fever or chills. Appetite is still poor, but no nausea vomiting or abdominal pain.  She has been having normal bowel movements. No dysuria or hematuria.  Objective: Vitals:   08/03/20 0011 08/03/20 0354 08/03/20 0740 08/03/20 0922  BP: 98/64 (!) 91/56  129/86  Pulse: 67 72  92  Resp: 19 18  18   Temp: 98.2 F (36.8 C) 98.2 F (36.8 C)  98 F (36.7 C)  TempSrc: Oral Oral  Oral  SpO2: 100% 94% 90% (!) 84%  Weight:      Height:        Intake/Output Summary (Last 24 hours) at 08/03/2020 1222 Last data filed at 08/03/2020 0358 Gross per 24 hour  Intake 530 ml  Output 0 ml  Net 530 ml   Filed Weights   07/16/20 0500 07/17/20 0426 07/18/20 0500  Weight: 56.4 kg 55.4 kg 54.9 kg    Examination:  General exam: Appears calm and comfortable  Respiratory system: Decreased breathing sounds.  respiratory effort normal. Cardiovascular system: S1 & S2 heard, RRR. No JVD, murmurs, rubs, gallops or clicks. No pedal edema. Gastrointestinal system: Abdomen is nondistended, soft and nontender. No organomegaly or masses felt. Normal bowel sounds heard. Central nervous system: Alert and oriented. No focal neurological deficits. Extremities: Symmetric 5 x 5 power. Skin: No rashes, lesions or ulcers Psychiatry:  Mood & affect appropriate.     Data Reviewed: I have personally reviewed following labs and imaging  studies  CBC: Recent Labs  Lab 07/28/20 0509 07/30/20 0450 08/01/20 0416 08/02/20 0346  WBC 7.4 6.9 8.1 7.6  NEUTROABS  --   --  4.8 4.4  HGB 9.8* 9.7* 10.3* 10.1*  HCT 32.6* 31.4* 33.6* 33.1*  MCV 95.0 90.0 91.3 91.9  PLT 345 389 405* 416*   Basic Metabolic Panel: Recent Labs  Lab 07/28/20 0509 08/01/20 0416 08/02/20 0346  NA 139 138 141  K 4.5 3.6 3.6  CL 90* 94* 95*  CO2 39* 35* 36*  GLUCOSE 84 88 86  BUN 16 10 15   CREATININE 0.66 0.72 0.86  CALCIUM 9.5 9.3 9.2  MG 1.9 1.8  --   PHOS 3.9  --   --    GFR: Estimated Creatinine Clearance: 48.8 mL/min (by C-G formula based on SCr of 0.86 mg/dL). Liver Function Tests: No results for input(s): AST, ALT, ALKPHOS, BILITOT, PROT, ALBUMIN in the last 168 hours. No results for input(s): LIPASE, AMYLASE in the last 168 hours. No results for input(s): AMMONIA in the last 168 hours. Coagulation Profile: No results for input(s): INR, PROTIME in the last 168 hours. Cardiac Enzymes: No results for input(s): CKTOTAL, CKMB, CKMBINDEX, TROPONINI in the last 168 hours. BNP (last 3 results) No results  for input(s): PROBNP in the last 8760 hours. HbA1C: No results for input(s): HGBA1C in the last 72 hours. CBG: Recent Labs  Lab 08/01/20 0927  GLUCAP 86   Lipid Profile: No results for input(s): CHOL, HDL, LDLCALC, TRIG, CHOLHDL, LDLDIRECT in the last 72 hours. Thyroid Function Tests: No results for input(s): TSH, T4TOTAL, FREET4, T3FREE, THYROIDAB in the last 72 hours. Anemia Panel: No results for input(s): VITAMINB12, FOLATE, FERRITIN, TIBC, IRON, RETICCTPCT in the last 72 hours. Sepsis Labs: No results for input(s): PROCALCITON, LATICACIDVEN in the last 168 hours.  No results found for this or any previous visit (from the past 240 hour(s)).       Radiology Studies: No results found.      Scheduled Meds: . acetylcysteine  4 mL Nebulization TID  . acidophilus  1 capsule Oral TID  . apixaban  5 mg Oral BID  .  arformoterol  15 mcg Nebulization BID  . clonazePAM  0.5 mg Oral BID  . feeding supplement  237 mL Oral TID BM  . gabapentin  300 mg Oral TID  . metoprolol tartrate  50 mg Oral BID  . multivitamin with minerals  1 tablet Oral Daily  . nystatin   Topical TID  . pantoprazole  40 mg Oral QHS  . QUEtiapine  25 mg Oral QHS   Continuous Infusions:   LOS: 58 days    Time spent: 28 minutes    Marrion Coy, MD [facility]   To contact the attending provider between 7A-7P or the covering provider during after hours 7P-7A, please log into the web site [email or web address] and access using universal [place] password for that web site. If you do not have the password, please call the hospital operator.  08/03/2020, 12:22 PM

## 2020-08-03 NOTE — Progress Notes (Signed)
Patient watched in a mirror as I did trach care and changed out her inner cannula

## 2020-08-03 NOTE — TOC Progression Note (Addendum)
Transition of Care [facility]) - Progression Note    Patient Details  Name: Virginia [name] MRN: [id number] Date of Birth: May 22, 1951  Transition of [facility]) CM/SW Contact  Margarito Liner, LCSW Phone Number: 08/03/2020, 9:37 AM  Clinical Narrative:  Received notification from [facility] that they should be able to accept patient for home health services.   11:37 am: [facility] NIV order form on chart for Dr. Karna Christmas to sign. Sent him a secure chat to notify. Per MD, hoping to discharge her on Monday if secretions improved. Patient and daughter-in-law are aware.    Barriers to Discharge: Continued Medical Work up  Expected Discharge Plan and Services   In-house Referral: Clinical Social Work,Hospice / Palliative Care   Post Acute Care Choice: Durable Medical Equipment (Oxygen 4L) Living arrangements for the past 2 months: Apartment                                       Social Determinants of Health (SDOH) Interventions    Readmission Risk Interventions No flowsheet data found.

## 2020-08-04 DIAGNOSIS — J441 Chronic obstructive pulmonary disease with (acute) exacerbation: Secondary | ICD-10-CM | POA: Diagnosis not present

## 2020-08-04 DIAGNOSIS — E43 Unspecified severe protein-calorie malnutrition: Secondary | ICD-10-CM

## 2020-08-04 DIAGNOSIS — R630 Anorexia: Secondary | ICD-10-CM

## 2020-08-04 DIAGNOSIS — J9622 Acute and chronic respiratory failure with hypercapnia: Secondary | ICD-10-CM | POA: Diagnosis not present

## 2020-08-04 DIAGNOSIS — J9621 Acute and chronic respiratory failure with hypoxia: Secondary | ICD-10-CM | POA: Diagnosis not present

## 2020-08-04 DIAGNOSIS — G9341 Metabolic encephalopathy: Secondary | ICD-10-CM | POA: Diagnosis not present

## 2020-08-04 MED ORDER — ACETYLCYSTEINE 20 % IN SOLN
4.0000 mL | Freq: Two times a day (BID) | RESPIRATORY_TRACT | Status: DC
  Administered 2020-08-04 – 2020-08-07 (×6): 4 mL via RESPIRATORY_TRACT
  Filled 2020-08-04 (×7): qty 4

## 2020-08-04 MED ORDER — DRONABINOL 2.5 MG PO CAPS
2.5000 mg | ORAL_CAPSULE | Freq: Two times a day (BID) | ORAL | Status: DC
  Administered 2020-08-04 – 2020-08-05 (×4): 2.5 mg via ORAL
  Filled 2020-08-04 (×4): qty 1

## 2020-08-04 NOTE — Progress Notes (Signed)
Pt consumed 95% of breakfast. Tolerated well.

## 2020-08-04 NOTE — Clinical Social Work Note (Signed)
Patient continues to exhibit signs of hypercapnia associated with chronic respiratory failure secondary to severe COPD. Patient requires the use of NIV both QHS and daytime to help with exacerbation periods. The use of the NIV will treat patient's high PCO2 levels and can reduce risk of exacerbations and future hospitalizations when used at night and during the day. Pt will need these advanced settings in conjunction with her current medication regimen; BIPAP is not an option due to its functional limitations and the severity of the patient's condition. Failure to have NIV available for use over a 24 hour period could lead to death. Patient able to maintain their own airway and clear their own secretions.   

## 2020-08-04 NOTE — Progress Notes (Signed)
Occupational Therapy Treatment Patient Details Name: LACHRISTA HESLIN MRN: [id number] DOB: 1950-12-26 Today's Date: 08/04/2020    History of present illness 70 yo female with severe COPD admitted with acute on chronic hypoxic hypercapnic respiratory failure and acute encephalopathy secondary to narcotic use and AECOPD requiring mechanical intubation. Initially intubated from 12/14-12/16 and then again from 12/18-12/23. Now with + trach collar. Recent hospitalization from 12/10-12/12/21. RR secondary to slurred speech and AMS. Transferred to CCU on 1/20 on full vent support. RE-evaluation performed this date as medical status has improved.   OT comments  Upon entering the room, pt supine in bed with no c/o pain this session. Pt agreeable to OT intervention. Pt donning and doffing PMSV during session. Pt donning hospital gown with set up A and ambulating 250' on 10 Ls this session. Pt began to desat with 50' left to return to room. Pt desat to 76% but quickly returns to 90% on 10Ls. Pt then returned to 5L at rest and able to maintain O2 requirement. Pt ambulating with RW with close supervision this session. Pt fixing bed linens while standing with close supervision this session. Pt returning to bed at end of session and asking therapist to look at PEG. Dressing removed and RN notified. All needs within reach. Pt continues to benefit from OT intervention.     Follow Up Recommendations  Home health OT;Supervision/Assistance - 24 hour    Equipment Recommendations  3 in 1 bedside commode;Tub/shower seat;Other (comment) (RW)       Precautions / Restrictions Precautions Precautions: Fall Precaution Comments: trach       Mobility Bed Mobility Overal bed mobility: Modified Independent Bed Mobility: Supine to Sit;Sit to Supine;Rolling Rolling: Modified independent (Device/Increase time)   Supine to sit: Modified independent (Device/Increase time) Sit to supine: Modified independent  (Device/Increase time)   General bed mobility comments: no physical assist  Transfers Overall transfer level: Needs assistance Equipment used: Rolling walker (2 wheeled) Transfers: Sit to/from Stand Sit to Stand: Supervision         General transfer comment: Only needs assistance to maintain lead/lines    Balance Overall balance assessment: Needs assistance Sitting-balance support: Feet supported Sitting balance-Leahy Scale: Good Sitting balance - Comments: no balance deficits sitting EOB   Standing balance support: Bilateral upper extremity supported;During functional activity Standing balance-Leahy Scale: Good                             ADL either performed or assessed with clinical judgement        Vision Patient Visual Report: No change from baseline            Cognition Arousal/Alertness: Awake/alert Behavior During Therapy: WFL for tasks assessed/performed Overall Cognitive Status: Within Functional Limits for tasks assessed                 General Comments: alert and oriented x4. Pt is agreeable to OT intervention. Pt reports she wants to go home.                   Pertinent Vitals/ Pain       Pain Assessment: No/denies pain         Frequency  Min 1X/week        Progress Toward Goals  OT Goals(current goals can now be found in the care plan section)  Progress towards OT goals: Progressing toward goals  Acute Rehab OT Goals Patient Stated Goal: To return  home safely OT Goal Formulation: With patient Time For Goal Achievement: 08/10/20 Potential to Achieve Goals: Good  Plan Discharge plan remains appropriate;Frequency remains appropriate       AM-PAC OT "6 Clicks" Daily Activity     Outcome Measure   Help from another person eating meals?: None Help from another person taking care of personal grooming?: A Little Help from another person toileting, which includes using toliet, bedpan, or urinal?: A Little Help  from another person bathing (including washing, rinsing, drying)?: A Little Help from another person to put on and taking off regular upper body clothing?: A Little Help from another person to put on and taking off regular lower body clothing?: A Little 6 Click Score: 19    End of Session Equipment Utilized During Treatment: Oxygen;Gait belt;Rolling walker  OT Visit Diagnosis: Other abnormalities of gait and mobility (R26.89);Muscle weakness (generalized) (M62.81)   Activity Tolerance Patient tolerated treatment well   Patient Left in bed;with call bell/phone within reach;with bed alarm set   Nurse Communication Mobility status        Time: 0093-8182 OT Time Calculation (min): 38 min  Charges: OT General Charges $OT Visit: 1 Visit OT Treatments $Self Care/Home Management : 8-22 mins $Therapeutic Activity: 23-37 mins  Jackquline Denmark, MS, OTR/L , CBIS ascom [phone]  08/04/20, 3:19 PM

## 2020-08-04 NOTE — Progress Notes (Signed)
Pt consumed 75% of lunch. Tolerated well.

## 2020-08-04 NOTE — Progress Notes (Addendum)
PROGRESS NOTE    Virginia [name]  PYK:[id number] DOB: 02/13/1951 DOA: 06/06/2020 PCP: Marguarite Arbour, MD   Chief complaint.  Shortness of breath. Brief Narrative:  This 70 years old female with severe COPD admitted with acute on chronic hypoxic and hypercapnic respiratory failure and acute encephalopathy secondary to narcotic use and advanced COPD requiring mechanical ventilation. She was treated with steroids and IV antibioticsin the ICU. She could not be liberated from the ventilator so tracheostomy was performed and she has been receiving oxygen via trach collar.She had prolonged ICU course.  PCCM pick up 1/14.Patient developed worsening hypoxia on 1/15. Chest x-ray shows bilateral lower lobe infiltrate consistent with aspiration pneumonia. She was started andhas completed antibiotics.  On 1/20:patient had a sudden onset of altered mental status, ABG shows severe metabolic acidosis. Patient was transferred to ICU.  PCCM pickup 1/24: Acute encephalopathy resolved. CT head unremarkable for any acute stroke or bleeding. Mental status has normalized without any focal deficit. COPD appears stable. Patient had episode of desaturation with mucous plugging on 1/29.  Improved after suctioning. The decision has been made to keep the PEG tube and consider remove it as outpatient. Not able to place patient, after discussion with patient and the family, the decision is made to go home with home care.  Trach catheter has been changed to cuffless.  Patient also has a trilogy set up by pulmonology.   Assessment & Plan:   Active Problems:   AF (paroxysmal atrial fibrillation) (HCC)   Anemia of chronic disease   COPD with acute lower respiratory infection (HCC)   Dysphagia   Respiratory failure (HCC)   COPD exacerbation (HCC)   Acute urinary retention   Acute metabolic encephalopathy   Acute on chronic respiratory failure with hypoxia and hypercapnia (HCC)   Aspiration pneumonia  of both lower lobes due to gastric secretions (HCC)   Reactive thrombocytosis   Pressure injury of skin   Diarrhea  #1. Acute on chronic respiratory failure with hypoxemia and hypercapnia. Status post tracheostomy and PEG tube placement. COPD exacerbation. Aspiration pneumonia secondary to Enterobacter aerogenes. Dysphagia. Patient condition had improved.  Patient seemed to have some desaturation last night, which is documented in the medical chart.  She actually had an episode of mucous plug.  But her airway secretion is getting better, she states that she only needed suctions twice in the last 24 hours.  Continue Mucomyst for now. Patient states that her appetite is better, however, calorie count is ongoing, not sure she has enough calories.  I will try to give her her some Marinol.  I also discontinued her Xanax to prevent over sedation. Still planning to discharge on Monday.  2.  Acute metabolic encephalopathy. Severe metabolic acidosis. Condition improved  4.  Paroxysmal defibrillation. Continue Eliquis.  5.  Anorexia with severe protein calorie malnutrition. As above. Patient does appear malnourished significantly, with muscle atrophy.     DVT prophylaxis: Eliquis Code Status: Full Family Communication: daughter-in-law updated Disposition Plan:  .   Status is: Inpatient  Remains inpatient appropriate because:Inpatient level of care appropriate due to severity of illness   Dispo: The patient is from: Home              Anticipated d/c is to: Home              Anticipated d/c date is: 3 days              Patient currently is not medically stable to d/c.  Difficult to place patient Yes        I/O last 3 completed shifts: In: 530 [P.O.:530] Out: 200 [Urine:200] No intake/output data recorded.     Consultants:   pulm  Procedures:   Antimicrobials:None  Subjective: Patient had an episode of desaturation suspect mucous plug.  She was suctioned and  placed on high oxygen to 12 L, came back to 5 L today.  Patient states that that she has less secretions from her airway, she only required twice with suctions in 24-hour. Patient also states that her her appetite is seem to be better, but her calorie count seemed to be inadequate at this time.  No nausea vomiting abdominal pain. No fever chills No dysuria or hematuria.  Objective: Vitals:   08/03/20 1929 08/03/20 2000 08/04/20 0520 08/04/20 0800  BP: 137/89  102/63 (!) 147/86  Pulse: 77  66 94  Resp: 20  20 18   Temp: 98.3 F (36.8 C)  98 F (36.7 C) 98.3 F (36.8 C)  TempSrc: Oral  Oral Oral  SpO2: 93% 93% 96% 92%  Weight:      Height:        Intake/Output Summary (Last 24 hours) at 08/04/2020 1026 Last data filed at 08/03/2020 2051 Gross per 24 hour  Intake 240 ml  Output 200 ml  Net 40 ml   Filed Weights   07/16/20 0500 07/17/20 0426 07/18/20 0500  Weight: 56.4 kg 55.4 kg 54.9 kg    Examination:  General exam: Appears calm and comfortable, malnurished Respiratory system: Decreased breathing sounds. Respiratory effort normal. Cardiovascular system: S1 & S2 heard, RRR. No JVD, murmurs, rubs, gallops or clicks. No pedal edema. Gastrointestinal system: Abdomen is nondistended, soft and nontender. No organomegaly or masses felt. Normal bowel sounds heard. Central nervous system: Alert and oriented. No focal neurological deficits. Extremities: Symmetric 5 x 5 power. Skin: No rashes, lesions or ulcers Psychiatry: Judgement and insight appear normal. Mood & affect appropriate.     Data Reviewed: I have personally reviewed following labs and imaging studies  CBC: Recent Labs  Lab 07/30/20 0450 08/01/20 0416 08/02/20 0346  WBC 6.9 8.1 7.6  NEUTROABS  --  4.8 4.4  HGB 9.7* 10.3* 10.1*  HCT 31.4* 33.6* 33.1*  MCV 90.0 91.3 91.9  PLT 389 405* 416*   Basic Metabolic Panel: Recent Labs  Lab 08/01/20 0416 08/02/20 0346  NA 138 141  K 3.6 3.6  CL 94* 95*  CO2 35*  36*  GLUCOSE 88 86  BUN 10 15  CREATININE 0.72 0.86  CALCIUM 9.3 9.2  MG 1.8  --    GFR: Estimated Creatinine Clearance: 48.8 mL/min (by C-G formula based on SCr of 0.86 mg/dL). Liver Function Tests: No results for input(s): AST, ALT, ALKPHOS, BILITOT, PROT, ALBUMIN in the last 168 hours. No results for input(s): LIPASE, AMYLASE in the last 168 hours. No results for input(s): AMMONIA in the last 168 hours. Coagulation Profile: No results for input(s): INR, PROTIME in the last 168 hours. Cardiac Enzymes: No results for input(s): CKTOTAL, CKMB, CKMBINDEX, TROPONINI in the last 168 hours. BNP (last 3 results) No results for input(s): PROBNP in the last 8760 hours. HbA1C: No results for input(s): HGBA1C in the last 72 hours. CBG: Recent Labs  Lab 08/01/20 0927  GLUCAP 86   Lipid Profile: No results for input(s): CHOL, HDL, LDLCALC, TRIG, CHOLHDL, LDLDIRECT in the last 72 hours. Thyroid Function Tests: No results for input(s): TSH, T4TOTAL, FREET4, T3FREE, THYROIDAB in the last  72 hours. Anemia Panel: No results for input(s): VITAMINB12, FOLATE, FERRITIN, TIBC, IRON, RETICCTPCT in the last 72 hours. Sepsis Labs: No results for input(s): PROCALCITON, LATICACIDVEN in the last 168 hours.  No results found for this or any previous visit (from the past 240 hour(s)).       Radiology Studies: No results found.      Scheduled Meds: . acetylcysteine  4 mL Nebulization TID  . acidophilus  1 capsule Oral TID  . apixaban  5 mg Oral BID  . arformoterol  15 mcg Nebulization BID  . clonazePAM  0.5 mg Oral BID  . feeding supplement  237 mL Oral TID BM  . gabapentin  300 mg Oral TID  . metoprolol tartrate  50 mg Oral BID  . multivitamin with minerals  1 tablet Oral Daily  . pantoprazole  40 mg Oral QHS  . QUEtiapine  25 mg Oral QHS   Continuous Infusions:   LOS: 59 days    Time spent: 34 minutes    Marrion Coy, MD [facility]   To contact the attending  provider between 7A-7P or the covering provider during after hours 7P-7A, please log into the web site [email or web address] and access using universal [place] password for that web site. If you do not have the password, please call the hospital operator.  08/04/2020, 10:26 AM

## 2020-08-04 NOTE — Plan of Care (Signed)
  Problem: Education: Goal: Knowledge of General Education information will improve Description: Including pain rating scale, medication(s)/side effects and non-pharmacologic comfort measures 08/04/2020 1539 by Ansel Bong, RN Outcome: Progressing 08/04/2020 1539 by Ansel Bong, RN Outcome: Progressing   Problem: Health Behavior/Discharge Planning: Goal: Ability to manage health-related needs will improve 08/04/2020 1539 by Ansel Bong, RN Outcome: Progressing 08/04/2020 1539 by Ansel Bong, RN Outcome: Progressing   Problem: Clinical Measurements: Goal: Ability to maintain clinical measurements within normal limits will improve 08/04/2020 1539 by Ansel Bong, RN Outcome: Progressing 08/04/2020 1539 by Ansel Bong, RN Outcome: Progressing Goal: Will remain free from infection 08/04/2020 1539 by Ansel Bong, RN Outcome: Progressing 08/04/2020 1539 by Ansel Bong, RN Outcome: Progressing Goal: Diagnostic test results will improve 08/04/2020 1539 by Ansel Bong, RN Outcome: Progressing 08/04/2020 1539 by Ansel Bong, RN Outcome: Progressing Goal: Respiratory complications will improve 08/04/2020 1539 by Ansel Bong, RN Outcome: Progressing 08/04/2020 1539 by Ansel Bong, RN Outcome: Progressing Goal: Cardiovascular complication will be avoided 08/04/2020 1539 by Ansel Bong, RN Outcome: Progressing 08/04/2020 1539 by Ansel Bong, RN Outcome: Progressing   Problem: Activity: Goal: Risk for activity intolerance will decrease 08/04/2020 1539 by Ansel Bong, RN Outcome: Progressing 08/04/2020 1539 by Ansel Bong, RN Outcome: Progressing   Problem: Nutrition: Goal: Adequate nutrition will be maintained 08/04/2020 1539 by Ansel Bong, RN Outcome: Progressing 08/04/2020 1539 by Ansel Bong, RN Outcome: Progressing   Problem: Coping: Goal: Level of anxiety will decrease 08/04/2020 1539 by Ansel Bong, RN Outcome:  Progressing 08/04/2020 1539 by Ansel Bong, RN Outcome: Progressing   Problem: Elimination: Goal: Will not experience complications related to bowel motility 08/04/2020 1539 by Ansel Bong, RN Outcome: Progressing 08/04/2020 1539 by Ansel Bong, RN Outcome: Progressing Goal: Will not experience complications related to urinary retention 08/04/2020 1539 by Ansel Bong, RN Outcome: Progressing 08/04/2020 1539 by Ansel Bong, RN Outcome: Progressing   Problem: Pain Managment: Goal: General experience of comfort will improve 08/04/2020 1539 by Ansel Bong, RN Outcome: Progressing 08/04/2020 1539 by Ansel Bong, RN Outcome: Progressing   Problem: Safety: Goal: Ability to remain free from injury will improve 08/04/2020 1539 by Ansel Bong, RN Outcome: Progressing 08/04/2020 1539 by Ansel Bong, RN Outcome: Progressing   Problem: Skin Integrity: Goal: Risk for impaired skin integrity will decrease 08/04/2020 1539 by Ansel Bong, RN Outcome: Progressing 08/04/2020 1539 by Ansel Bong, RN Outcome: Progressing

## 2020-08-04 NOTE — Progress Notes (Signed)
48 Hour Calorie Count   Estimated Nutritional Needs:   Kcal:  1500-1700kcal/day Protein:  75-85g/day Fluid:  1.5-1.7 L/day  Day 1   Breakfast 2/9: 1/2 blueberry muffin, 1 cranberry juice  Total- 125kcal and 1g protein   Lunch 2/9: 1 cranberry juice and 1 pudding   Total- 200kcal and 1g protein   Dinner 2/9: 1 Magic Cup  Total- 290kcal and 9g protein   Total Day 1: 615kcal  (41% estimated needs) and 11g protein  (15% estimated needs)  Day 2  Breakfast 2/10: 95% pancake, 100% sausage patty, 1 Magic Cup  Total- 631kcal and 16g protein   Lunch 2/10: 100% mashed potatoes, 1 bite beef, 1 Magic Cup  Total- 400kcal and 11g protein   Dinner 2/10: 25% rice, 25% peas and 100% fish  Total- 218kcal and 21g protein   Total Day 2: 1249kcal  (83% estimated needs) and 48g protein  (64% estimated needs)  Betsey Holiday MS, RD, LDN Please refer to [facility] for RD and/or RD on-call/weekend/after hours pager

## 2020-08-04 NOTE — TOC Progression Note (Addendum)
Transition of Care [facility]) - Progression Note    Patient Details  Name: Virginia [name] MRN: [id number] Date of Birth: 1951-01-03  Transition of Care [facility]) CM/SW Contact  Margarito Liner, LCSW Phone Number: 08/04/2020, 3:30 PM  Clinical Narrative:  Signed NIV order and narrative sent to [facility] [address] representative. [employer] supplies should be delivered Monday morning around 9:00 to their warehouse so they can deliver them with the NIV to the home later that day. Gave [employer] rep daughter-in-law's phone number as point of contact for delivery.   5:05 pm: [employer] put incorrect ICD code on NIV order form. Dr. Karna Christmas will correct tomorrow and notify [facility] staff member assigned so it can be faxed back to [employer].    Barriers to Discharge: Continued Medical Work up  Expected Discharge Plan and Services   In-house Referral: Clinical Social Work,Hospice / Palliative Care   Post Acute Care Choice: Durable Medical Equipment (Oxygen 4L) Living arrangements for the past 2 months: Apartment                                       Social Determinants of Health (SDOH) Interventions    Readmission Risk Interventions No flowsheet data found.

## 2020-08-05 DIAGNOSIS — R630 Anorexia: Secondary | ICD-10-CM

## 2020-08-05 DIAGNOSIS — G9341 Metabolic encephalopathy: Secondary | ICD-10-CM | POA: Diagnosis not present

## 2020-08-05 DIAGNOSIS — E43 Unspecified severe protein-calorie malnutrition: Secondary | ICD-10-CM

## 2020-08-05 DIAGNOSIS — J9621 Acute and chronic respiratory failure with hypoxia: Secondary | ICD-10-CM | POA: Diagnosis not present

## 2020-08-05 NOTE — Progress Notes (Signed)
PULMONARY PROGRESS NOTE    Name: Virginia [name] MRN: [id number] DOB: 05-27-1951     LOS: 60   SUBJECTIVE FINDINGS & SIGNIFICANT EVENTS    Patient description:  70 yo female with severe COPD admitted with acute on chronic hypoxic hypercapnic respiratory failure and acute encephalopathy secondary to narcotic use and AECOPD requiring mechanical intubation  Now s/p trach/PEG working on trach weaning.  On TC at present, anxious. Remains on precedex.  06/27/20- patient is improved.  Signed out to [facility] - Dr Mayford Knife for pick up in am.   06/29/20- patient is with respiratory distress, she has high volume phlegm from trache, ive suctioned out whats possible via [place] and messaged RT to assist.  RN was able to come in and evaluate patient and she was able to communicate appropriately.   07/07/20- patient is in bed resting. She is clinically better today then yesterday. Resting in bed in no distress.  07/08/20- Patient is lucid and awake.  Son is at bedside we discussed care plan.  CXR in am.   1/16-17/22- patient resting in bed comfortbably no acute events overnight.   07/11/20- patient had episode of mild respiratory distress due to voluminous phlegm expectorated via trache. This was suctioned by RN with recovery. Patient is clear to auscultation with occasional low pitch wheezes.  She is being optimized for d/c.   07/20/20- patient had episode of mild-moderate respiratory distress due to obstructed trache with inspissated phlegm.  She is on 7-8L/min humidifed HFnc.  She is with ENTEROBACTER AEROGENES on trache aspirate culture. ID is following and there is abscence of leucosytosis or fevers in several days.    07/21/20- patient is with increased O2 requirement this morning up to 10L/min.  She is actually clinically  better in appearance and lung auscultation is improved from yesterday.  She is smiling during interview.  She has PT today and may have increased O2 in preparation for therapy. Plan to continue current care plan.   07/23/20- patient is very frustrated this am due to wanting to be [facility] home. She is able to communicate via [employer] valve.  She shares there are multiple people at home that care for her and she wishes to be dcd home. I have asked her to please cooperate and be patient however she is aggitated and wishes to go home.    07/26/2020- patient is sitting up in chair, she is eating lunch without regurgitation.  Daughter is in room during evaluation.  Patient is vocalizing well with PMV.  She is requesting reversal of PEG tube and dc home. She remains on 8L/min [place].  She should have PT/OT/CPT  07/27/20- patient is stable however on 8-10L with trache collar.  She is eating regular food despite trach and has passed thorough speech evaluation.  She requests removal of her PEG tube and has not used it.  Have discussed with Dr. Lucianne Muss and we are working on accommodating patient's request.  She continues to work with PT and OT.  She requests to be discharged home after medical optimization.  07/28/20- patient is imporved, she is down to 5L/min.  She ambulated well today with PT.  She has requested reversal of PEG.     07/29/2020- patient improved, O2 weaned to 5L/min, worked with PT/OT today.  She is improving and vocalizing well, feeling more confident and stronger.  Wants to go home after PEG removed/reversed.   07/30/2020- patient resting in bed in no distress.  Vitals are stable. No overnight  events.    08/01/2020- patient should have cuffed trache removed.  Discussed case with attending physician, case manager , ENT, SLP team and plan is to have NIV set up on outpatient with d/c home.  She has chronic respiratory failure but is now eating with trache and vocalizing with PMV.  She will follow up at  [facility] pulmonary with Dr Meredeth Ide.   08/03/2020- patient is improved.  I have filled out paperwork for NIV trilogy.  Patient wishes to be discharged home as soon as possible, currently she is improved and lucid able to vocalize with PMV well.  She wishes to have trache removed if possible.   08/05/2020- patient is doing good today, shes speaking freely with no significant phlegm impacted in airways.  She is now at 5L/min and shes very happy to see improvement.   PHYSICAL EXAMINATION   Vital Signs: Temp:  [98.3 F (36.8 C)-98.7 F (37.1 C)] 98.3 F (36.8 C) (02/12 0756) Pulse Rate:  [70-90] 70 (02/12 0756) Resp:  [16-18] 16 (02/12 0439) BP: (108-132)/(73-97) 125/80 (02/12 0756) SpO2:  [94 %-100 %] 94 % (02/12 0854) FiO2 (%):  [28 %-40 %] 28 % (02/12 0854)   FiO2 (%):  [28 %-40 %] 28 %   Constitutional: anxious frail woman lying in bed  Eyes: eomi, pupils equal Ears, nose, mouth, and throat: trach in place, minmal secretions Cardiovascular: RRR,  Ext warm Respiratory: diminished with prolonged expiratory phase, occasional accessory muscle use Gastrointestinal: soft, abd wrapped after PEG, some mild tenderness at PEG insertion site MSK: muscle wasting Skin: No rashes, normal turgor, scattered bruising Neurologic: moves all 4 ext to command, weak Psychiatric: anxious, answering questions appropriately   Scheduled Meds: . acetylcysteine  4 mL Nebulization BID  . acidophilus  1 capsule Oral TID  . apixaban  5 mg Oral BID  . arformoterol  15 mcg Nebulization BID  . dronabinol  2.5 mg Oral BID AC  . feeding supplement  237 mL Oral TID BM  . gabapentin  300 mg Oral TID  . metoprolol tartrate  50 mg Oral BID  . multivitamin with minerals  1 tablet Oral Daily  . pantoprazole  40 mg Oral QHS  . QUEtiapine  25 mg Oral QHS   Continuous Infusions:  PRN Meds:.acetaminophen (TYLENOL) oral liquid 160 mg/5 mL, diazepam, ipratropium-albuterol, labetalol, naphazoline-glycerin,  nystatin, ondansetron (ZOFRAN) IV, [DISCONTINUED] oxyCODONE **AND** oxyCODONE-acetaminophen    ASSESSMENT AND PLAN   Acute on chronic hypoxemic and hypercapnic respiratory failure thought secondary to polypharmacy long wean from ventilator due to muscular deconditioning now s/p tracheostomy.         -Heavy phlegm production - Glycopyrrolate tid 1mg -07/21/20  Dysphagia, moderate protein calorie malnutrition- now s/p PEG  Severe baseline COPD- complicated trach wean - continue COPD carepath  Muscular deconditioning-PT/OT/CPT  Chronic anxiety/depression  Metabolic encephalopathy/agitation- improving  - repeat tracheal aspirate ordered -ENTEROBACTER AEROGENES - Switched from standing duonebs from duonebs to brovana/yupelri - PT/OT up to chair - Continue standing clonazepam, use valium per tube for breakthrough - SLP for PMV trials - g off IV meds for rehab vs. SNF vs. LTACH vs home?    Patient chronically critically ill due to respiratory failure, metabolic encephalopathy Interventions to address this today weaning precedex, weaning vent Risk of deterioration without these interventions is high    Patient would benefit from [facility]. *This note was dictated using voice recognition software/Dragon.  Despite best efforts to proofread, errors can occur which can change the meaning.  Any change  was purely unintentional.     Vida Rigger, M.D.  Pulmonary & Critical Care Medicine  [facility]

## 2020-08-05 NOTE — Progress Notes (Signed)
Pt up to bedside commode several times throughout the day. Noticed each time her O2 would drop to low 80's. Per Student nurse, around 5:45 pm patient got up to bedside and her O2 dropped to 78%. After she got back in the bed it took few min to get back to 92%. MD Chipper Herb and RT made aware. Per RT, O2 supply was increased from 5 L to 10 L. Pts.O2 maintaining above 92% for now.

## 2020-08-05 NOTE — Progress Notes (Signed)
PROGRESS NOTE    Virginia [name]  ZOX:[id number] DOB: 12-26-1950 DOA: 06/06/2020 PCP: Marguarite Arbour, MD   Chief complaint.  Shortness of breath. Brief Narrative:   This 70 years old female with severe COPD admitted with acute on chronic hypoxic and hypercapnic respiratory failure and acute encephalopathy secondary to narcotic use and advanced COPD requiring mechanical ventilation. She was treated with steroids and IV antibioticsin the ICU. She could not be liberated from the ventilator so tracheostomy was performed and she has been receiving oxygen via trach collar.She had prolonged ICU course.  PCCM pick up 1/14.Patient developed worsening hypoxia on 1/15. Chest x-ray shows bilateral lower lobe infiltrate consistent with aspiration pneumonia. She was started andhas completed antibiotics.  On 1/20:patient had a sudden onset of altered mental status, ABG shows severe metabolic acidosis. Patient was transferred to ICU.  PCCM pickup 1/24: Acute encephalopathy resolved. CT head unremarkable for any acute stroke or bleeding. Mental status has normalized without any focal deficit. COPD appears stable. Patient had episode of desaturation with mucous plugging on 1/29.  Improved after suctioning. The decision has been made to keep the PEG tube and consider remove it as outpatient. Not able to place patient, after discussion with patient and the family, the decision is made to go home with home care.  Trach catheter has been changed to cuffless.  Patient also has a trilogy set up by pulmonology.  Assessment & Plan:   Active Problems:   AF (paroxysmal atrial fibrillation) (HCC)   Anemia of chronic disease   COPD with acute lower respiratory infection (HCC)   Dysphagia   Respiratory failure (HCC)   COPD exacerbation (HCC)   Acute urinary retention   Acute metabolic encephalopathy   Acute on chronic respiratory failure with hypoxia and hypercapnia (HCC)   Aspiration pneumonia  of both lower lobes due to gastric secretions (HCC)   Reactive thrombocytosis   Pressure injury of skin   Diarrhea   Anorexia   Severe protein-calorie malnutrition (HCC)  #1. Acute on chronic respiratory failure with hypoxemia and hypercapnia. Status post tracheostomy and PEG tube placement. COPD exacerbation. Aspiration pneumonia secondary to Enterobacter aerogenes. Dysphagia. Condition much stable over last 24 hours, she just need to suctions.  She has been 5 L oxygen the entire time. Continue current treatment with Mucomyst and oxygen treatment.  Continue intermittent suction.  2.  Acute metabolic encephalopathy. Severe metabolic acidosis. Condition improved  #3. Anorexia with severe protein calorie malnutrition. Patient has made progress with the eating, Marinol added.  Advised patient to drink Ensure.  4.  Paroxysmal atrial fibrillation. Continue Eliquis.     DVT prophylaxis: Eliquis Code Status: Full Family Communication:  Disposition Plan:  .   Status is: Inpatient  Remains inpatient appropriate because:Inpatient level of care appropriate due to severity of illness   Dispo: The patient is from: Home              Anticipated d/c is to: Home              Anticipated d/c date is: 2 days              Patient currently is not medically stable to d/c.   Difficult to place patient No        I/O last 3 completed shifts: In: 120 [P.O.:120] Out: 1100 [Urine:1100] No intake/output data recorded.     Consultants:   Pulm  Procedures: Trach/Peg  Antimicrobials: None  Subjective: Patient doing better over the last 24  hours, on 5 L oxygen, secretions seem to be better, she just required 2 suctions over 24 hours.  She is able to suction herself, cough is more forceful. No fever or chills. No abdominal pain or nausea vomiting.  She had a normal bowel movement yesterday. Appetite is gradually improving.    Objective: Vitals:   08/04/20 1921 08/04/20  2024 08/05/20 0439 08/05/20 0756  BP: (!) 132/97  108/73 125/80  Pulse: 90  71 70  Resp: 16  16   Temp: 98.4 F (36.9 C)  98.5 F (36.9 C) 98.3 F (36.8 C)  TempSrc: Oral  Oral Oral  SpO2: 100% 94% 98% 95%  Weight:      Height:        Intake/Output Summary (Last 24 hours) at 08/05/2020 1009 Last data filed at 08/05/2020 0448 Gross per 24 hour  Intake 120 ml  Output 900 ml  Net -780 ml   Filed Weights   07/16/20 0500 07/17/20 0426 07/18/20 0500  Weight: 56.4 kg 55.4 kg 54.9 kg    Examination:  General exam: Appears calm and comfortable, appear malnourished Respiratory system: Decreased breathing sounds. Respiratory effort normal. Cardiovascular system: S1 & S2 heard, RRR. No JVD, murmurs, rubs, gallops or clicks. No pedal edema. Gastrointestinal system: Abdomen is nondistended, soft and nontender. No organomegaly or masses felt. Normal bowel sounds heard. Central nervous system: Alert and oriented. No focal neurological deficits. Extremities: Symmetric  Skin: No rashes, lesions or ulcers Psychiatry: Judgement and insight appear normal. Mood & affect appropriate.     Data Reviewed: I have personally reviewed following labs and imaging studies  CBC: Recent Labs  Lab 07/30/20 0450 08/01/20 0416 08/02/20 0346  WBC 6.9 8.1 7.6  NEUTROABS  --  4.8 4.4  HGB 9.7* 10.3* 10.1*  HCT 31.4* 33.6* 33.1*  MCV 90.0 91.3 91.9  PLT 389 405* 416*   Basic Metabolic Panel: Recent Labs  Lab 08/01/20 0416 08/02/20 0346  NA 138 141  K 3.6 3.6  CL 94* 95*  CO2 35* 36*  GLUCOSE 88 86  BUN 10 15  CREATININE 0.72 0.86  CALCIUM 9.3 9.2  MG 1.8  --    GFR: Estimated Creatinine Clearance: 48.8 mL/min (by C-G formula based on SCr of 0.86 mg/dL). Liver Function Tests: No results for input(s): AST, ALT, ALKPHOS, BILITOT, PROT, ALBUMIN in the last 168 hours. No results for input(s): LIPASE, AMYLASE in the last 168 hours. No results for input(s): AMMONIA in the last 168  hours. Coagulation Profile: No results for input(s): INR, PROTIME in the last 168 hours. Cardiac Enzymes: No results for input(s): CKTOTAL, CKMB, CKMBINDEX, TROPONINI in the last 168 hours. BNP (last 3 results) No results for input(s): PROBNP in the last 8760 hours. HbA1C: No results for input(s): HGBA1C in the last 72 hours. CBG: Recent Labs  Lab 08/01/20 0927  GLUCAP 86   Lipid Profile: No results for input(s): CHOL, HDL, LDLCALC, TRIG, CHOLHDL, LDLDIRECT in the last 72 hours. Thyroid Function Tests: No results for input(s): TSH, T4TOTAL, FREET4, T3FREE, THYROIDAB in the last 72 hours. Anemia Panel: No results for input(s): VITAMINB12, FOLATE, FERRITIN, TIBC, IRON, RETICCTPCT in the last 72 hours. Sepsis Labs: No results for input(s): PROCALCITON, LATICACIDVEN in the last 168 hours.  No results found for this or any previous visit (from the past 240 hour(s)).       Radiology Studies: No results found.      Scheduled Meds: . acetylcysteine  4 mL Nebulization BID  .  acidophilus  1 capsule Oral TID  . apixaban  5 mg Oral BID  . arformoterol  15 mcg Nebulization BID  . dronabinol  2.5 mg Oral BID AC  . feeding supplement  237 mL Oral TID BM  . gabapentin  300 mg Oral TID  . metoprolol tartrate  50 mg Oral BID  . multivitamin with minerals  1 tablet Oral Daily  . pantoprazole  40 mg Oral QHS  . QUEtiapine  25 mg Oral QHS   Continuous Infusions:   LOS: 60 days    Time spent: 28 minutes    Marrion Coy, MD [facility]   To contact the attending provider between 7A-7P or the covering provider during after hours 7P-7A, please log into the web site [email or web address] and access using universal  password for that web site. If you do not have the password, please call the hospital operator.  08/05/2020, 10:09 AM

## 2020-08-06 DIAGNOSIS — J9621 Acute and chronic respiratory failure with hypoxia: Secondary | ICD-10-CM | POA: Diagnosis not present

## 2020-08-06 DIAGNOSIS — R630 Anorexia: Secondary | ICD-10-CM | POA: Diagnosis not present

## 2020-08-06 DIAGNOSIS — I480 Paroxysmal atrial fibrillation: Secondary | ICD-10-CM | POA: Diagnosis not present

## 2020-08-06 DIAGNOSIS — J690 Pneumonitis due to inhalation of food and vomit: Secondary | ICD-10-CM | POA: Diagnosis not present

## 2020-08-06 MED ORDER — DIAZEPAM 5 MG PO TABS
2.5000 mg | ORAL_TABLET | Freq: Four times a day (QID) | ORAL | Status: DC | PRN
  Administered 2020-08-06 – 2020-08-11 (×13): 2.5 mg via ORAL
  Filled 2020-08-06 (×13): qty 1

## 2020-08-06 MED ORDER — DIAZEPAM 5 MG/ML PO CONC: 2.5000 mg | Freq: Four times a day (QID) | ORAL | Status: DC | PRN

## 2020-08-06 MED ORDER — DRONABINOL 2.5 MG PO CAPS
2.5000 mg | ORAL_CAPSULE | Freq: Two times a day (BID) | ORAL | Status: DC
  Administered 2020-08-06 – 2020-08-11 (×10): 2.5 mg via ORAL
  Filled 2020-08-06 (×10): qty 1

## 2020-08-06 NOTE — Progress Notes (Signed)
   08/06/20 1953  Assess: MEWS Score  Temp 98.7 F (37.1 C)  BP (!) 142/98  Pulse Rate (!) 101  Resp (!) 32  Level of Consciousness Alert  SpO2 97 %  O2 Device Tracheostomy Collar  O2 Flow Rate (L/min) 10 L/min  Assess: MEWS Score  MEWS Temp 0  MEWS Systolic 0  MEWS Pulse 1  MEWS RR 2  MEWS LOC 0  MEWS Score 3  MEWS Score Color Yellow  Treat  Pain Scale 0-10  Pain Score 0  Take Vital Signs  Increase Vital Sign Frequency  Yellow: Q 2hr X 2 then Q 4hr X 2, if remains yellow, continue Q 4hrs  Escalate  MEWS: Escalate Yellow: discuss with charge nurse/RN and consider discussing with provider and RRT  Notify: Charge Nurse/RN  Name of Charge Nurse/RN Notified Tamara RN  Date Charge Nurse/RN Notified 08/06/20  Time Charge Nurse/RN Notified 2013   Patient very anxious, tachyneic and tachycardic. Provider informed. Diazepam 2.5 mg tab given Will rechecked V/S and will continue to monitor.

## 2020-08-06 NOTE — Progress Notes (Addendum)
Physical Therapy Treatment and Re-Evaluation Patient Details Name: Virginia [name] MRN: [id number] DOB: Mar 22, 1951 Today's Date: 08/06/2020    History of Present Illness 70 yo female with severe COPD admitted with acute on chronic hypoxic hypercapnic respiratory failure and acute encephalopathy secondary to narcotic use and AECOPD requiring mechanical intubation. Initially intubated from 12/14-12/16 and then again from 12/18-12/23. Now with + trach collar. Recent hospitalization from 12/10-12/12/21. RR secondary to slurred speech and AMS. Transferred to CCU on 1/20 on full vent support. Re-evaluation performed this date.    PT Comments    Patient alert, in bed, agreeable to PT and denied pain. Pt goals re-assessed at this time and updated, pt continues to make functional improvements but exhibited difficulties in activity tolerance, endurance, and balance. Extended time spent in sitting today for equipment management; respiratory called about disconnecting aerogen attachment, verbalized consent. Pt did take one supine rest break during sitting, but all bed mobility modI. She was able to transfer with RW and supervision, and ambulated ~141ft with RW and CGA. Pt encouraged to maintain breath with exertion and pace activity. Attempted spO2 readings with three different devices; placed on 15L for ambulation due to poor readings, and when returned to bed spO2 readings in 70s-80s. Recovered within 2 minutes, and returned to 10L (on 10L at start of session) with spO2 93%. Pt in bed with all needs in reach at end of session. The patient would benefit from further skilled PT intervention to continue to progress towards goals. Recommendation remains appropriate.       Follow Up Recommendations  Home health PT;Supervision/Assistance - 24 hour;Other (comment)     Equipment Recommendations  None recommended by PT    Recommendations for Other Services       Precautions / Restrictions  Precautions Precautions: Fall Precaution Comments: trach Restrictions Weight Bearing Restrictions: No    Mobility  Bed Mobility Overal bed mobility: Modified Independent Bed Mobility: Supine to Sit;Sit to Supine           General bed mobility comments: no physical assist    Transfers Overall transfer level: Needs assistance Equipment used: Rolling walker (2 wheeled) Transfers: Sit to/from Stand Sit to Stand: Supervision         General transfer comment: Only needs assistance to maintain lead/lines  Ambulation/Gait Ambulation/Gait assistance: Min guard Gait Distance (Feet): 110 Feet Assistive device: Rolling walker (2 wheeled) Gait Pattern/deviations: Step-through pattern     General Gait Details: on 10L via trach collar, bumped to 15L due to unclear spO2 readings. When returned to supine, spO2 readings in high 70s-80s, and pt notably SOB.   Stairs             Wheelchair Mobility    Modified Rankin (Stroke Patients Only)       Balance Overall balance assessment: Needs assistance Sitting-balance support: Feet supported Sitting balance-Leahy Scale: Good Sitting balance - Comments: no balance deficits sitting EOB     Standing balance-Leahy Scale: Good Standing balance comment: requires UE support due to fxl activity tolerance.                            Cognition Arousal/Alertness: Awake/alert Behavior During Therapy: WFL for tasks assessed/performed Overall Cognitive Status: Within Functional Limits for tasks assessed                                 General Comments: alert  and oriented x4. Pt is agreeable to PT intervention. able to don/doff PMV independently      Exercises Other Exercises Other Exercises: extended time spent in sitting for equipment management. Pt returned to supine to rest once, and then back into sitting. poor spO2 readings throughout. Other Exercises: Respiratory contacted a bout aerogen  attatchment to trach collar- able to be removed for mobility, replaced at end of session    General Comments General comments (skin integrity, edema, etc.): pt on 10L via trach collar at start of session; poor O2 pleth noted throughout session. Attempted with three different devices. Placed on 15L for ambulation due to poor readings, and when returned to bed spO2 readings in 70s-80s. Recovered within 2 minutes, and returned to 10L with spO2 93%.      Pertinent Vitals/Pain Pain Assessment: No/denies pain    Home Living                      Prior Function            PT Goals (current goals can now be found in the care plan section) Acute Rehab PT Goals Patient Stated Goal: To return home safely PT Goal Formulation: With patient Time For Goal Achievement: 08/20/20 Potential to Achieve Goals: Fair Additional Goals Additional Goal #1: Pt will be able to perform bed mobility/transfers with mod I and safe technique to improve functional independence Progress towards PT goals: Progressing toward goals    Frequency    Min 2X/week      PT Plan Current plan remains appropriate    Co-evaluation              AM-PAC PT "6 Clicks" Mobility   Outcome Measure  Help needed turning from your back to your side while in a flat bed without using bedrails?: None Help needed moving from lying on your back to sitting on the side of a flat bed without using bedrails?: None Help needed moving to and from a bed to a chair (including a wheelchair)?: A Little Help needed standing up from a chair using your arms (e.g., wheelchair or bedside chair)?: A Little Help needed to walk in hospital room?: A Little Help needed climbing 3-5 steps with a railing? : A Little 6 Click Score: 20    End of Session Equipment Utilized During Treatment: Gait belt;Oxygen Activity Tolerance: Patient tolerated treatment well Patient left: with call bell/phone within reach;with bed alarm set;in bed Nurse  Communication: Mobility status PT Visit Diagnosis: Unsteadiness on feet (R26.81);Muscle weakness (generalized) (M62.81);Difficulty in walking, not elsewhere classified (R26.2)     Time: 0737-1062 PT Time Calculation (min) (ACUTE ONLY): 35 min  Charges:  $Therapeutic Exercise: 23-37 mins                    Olga Coaster PT, DPT 10:21 AM,08/06/20

## 2020-08-06 NOTE — Progress Notes (Signed)
PROGRESS NOTE    Virginia [name]  NAT:[id number] DOB: 23-Jun-1951 DOA: 06/06/2020 PCP: Marguarite Arbour, MD   Chief complaint.  Shortness of breath Brief Narrative:  This 70 years old female with severe COPD admitted with acute on chronic hypoxic and hypercapnic respiratory failure and acute encephalopathy secondary to narcotic use and advanced COPD requiring mechanical ventilation. She was treated with steroids and IV antibioticsin the ICU. She could not be liberated from the ventilator so tracheostomy was performed and she has been receiving oxygen via trach collar.She had prolonged ICU course.  PCCM pick up 1/14.Patient developed worsening hypoxia on 1/15. Chest x-ray shows bilateral lower lobe infiltrate consistent with aspiration pneumonia. She was started andhas completed antibiotics.  On 1/20:patient had a sudden onset of altered mental status, ABG shows severe metabolic acidosis. Patient was transferred to ICU.  PCCM pickup 1/24: Acute encephalopathy resolved. CT head unremarkable for any acute stroke or bleeding. Mental status has normalized without any focal deficit. COPD appears stable. Patient had episode of desaturation with mucous plugging on 1/29. Improved after suctioning. The decision has been made to keep the PEG tube and consider remove it as outpatient. Not able to place patient, after discussion with patient and the family, the decision is made to go home with home care. Trach catheter has been changed to cuffless.Patient also has a trilogy set up by pulmonology.   Assessment & Plan:   Active Problems:   AF (paroxysmal atrial fibrillation) (HCC)   Anemia of chronic disease   COPD with acute lower respiratory infection (HCC)   Dysphagia   Respiratory failure (HCC)   COPD exacerbation (HCC)   Acute urinary retention   Acute metabolic encephalopathy   Acute on chronic respiratory failure with hypoxia and hypercapnia (HCC)   Aspiration pneumonia  of both lower lobes due to gastric secretions (HCC)   Reactive thrombocytosis   Pressure injury of skin   Diarrhea   Anorexia   Severe protein-calorie malnutrition (HCC)  #1. Acute on chronic respiratory failure with hypoxemia and hypercapnia. Status post tracheostomy and PEG tube placement. COPD exacerbation. Aspiration pneumonia secondary to Enterobacter aerogenes. Dysphagia. Patient required for suctions over the last 24 hours.  Patient was able to perform suction to herself.  Currently she is on 5 L oxygen.  She had increased oxygen requirement when she got up multiple times yesterday evening, I have confirmed that with the nurse.  She was on higher flow oxygen last night. She has completed all antibiotics. Planning to discharge in next 1 to 2 days.  #2.  Anorexia with a severe protein calorie malnutrition. Appetite has been improving since Marinol started.  Patient also drinking Ensure.  #3.  Acute metabolic cephalopathy.  Severe metabolic acidosis. Condition improved. Patient has occasional confusion due to CO2 retention.  I will discontinue benzodiazepine.  Also discontinued as needed pain medicine.  4.  Paroxysmal atrial fibrillation. Continue Eliquis.   DVT prophylaxis: Eliquis Code Status: Full Family Communication: daughter in law updated Disposition Plan:  .   Status is: Inpatient  Remains inpatient appropriate because:Inpatient level of care appropriate due to severity of illness   Dispo: The patient is from: Home              Anticipated d/c is to: Home              Anticipated d/c date is: 1 day              Patient currently is not medically stable to  d/c.   Difficult to place patient No        I/O last 3 completed shifts: In: 0  Out: 1700 [Urine:1700] No intake/output data recorded.     Consultants:   pulm  Procedures:   Antimicrobials: none  Subjective: Patient had a desaturation yesterday evening.  I spoke with the nurse, patient  was ambulating quite a bit yesterday evening, which dropped her oxygen.  Oxygen was kept overnight on 10 L.  Feels better this morning, oxygen was dropped back down to 5 L. I personally seen the patient's try to suction herself.  She has some confusion and actually placed the suction tube into her mouth.  The tube was replaced, suction review result in loose mucus.  Easy to suction.  Patient has required 3 suctions over 24 hour period per nurse. She also had a better appetite with Marinol.  She is drinking her Ensure. No abdominal pain or nausea vomiting.  She had a normal bowel movement today. No fever chills pain No dysuria hematuria.  Objective: Vitals:   08/05/20 2300 08/06/20 0007 08/06/20 0417 08/06/20 0800  BP:  107/81 137/85 134/89  Pulse:  69 70 72  Resp:  14 16 (!) 22  Temp:  98.5 F (36.9 C) 98.2 F (36.8 C) 98.1 F (36.7 C)  TempSrc:  Oral Oral Oral  SpO2: 92% 97% 99% 96%  Weight:      Height:        Intake/Output Summary (Last 24 hours) at 08/06/2020 0945 Last data filed at 08/06/2020 0444 Gross per 24 hour  Intake 0 ml  Output 600 ml  Net -600 ml   Filed Weights   07/16/20 0500 07/17/20 0426 07/18/20 0500  Weight: 56.4 kg 55.4 kg 54.9 kg    Examination:  General exam: Appears calm and comfortable  Respiratory system: Decreased breathing sounds. Respiratory effort normal. Cardiovascular system: S1 & S2 heard, RRR. No JVD, murmurs, rubs, gallops or clicks. No pedal edema. Gastrointestinal system: Abdomen is nondistended, soft and nontender. No organomegaly or masses felt. Normal bowel sounds heard. Central nervous system: Alert and oriented x3. No focal neurological deficits. Extremities: Symmetric 5 x 5 power. Skin: No rashes, lesions or ulcers Psychiatry:  Mood & affect appropriate.     Data Reviewed: I have personally reviewed following labs and imaging studies  CBC: Recent Labs  Lab 08/01/20 0416 08/02/20 0346  WBC 8.1 7.6  NEUTROABS 4.8 4.4  HGB  10.3* 10.1*  HCT 33.6* 33.1*  MCV 91.3 91.9  PLT 405* 416*   Basic Metabolic Panel: Recent Labs  Lab 08/01/20 0416 08/02/20 0346  NA 138 141  K 3.6 3.6  CL 94* 95*  CO2 35* 36*  GLUCOSE 88 86  BUN 10 15  CREATININE 0.72 0.86  CALCIUM 9.3 9.2  MG 1.8  --    GFR: Estimated Creatinine Clearance: 48.8 mL/min (by C-G formula based on SCr of 0.86 mg/dL). Liver Function Tests: No results for input(s): AST, ALT, ALKPHOS, BILITOT, PROT, ALBUMIN in the last 168 hours. No results for input(s): LIPASE, AMYLASE in the last 168 hours. No results for input(s): AMMONIA in the last 168 hours. Coagulation Profile: No results for input(s): INR, PROTIME in the last 168 hours. Cardiac Enzymes: No results for input(s): CKTOTAL, CKMB, CKMBINDEX, TROPONINI in the last 168 hours. BNP (last 3 results) No results for input(s): PROBNP in the last 8760 hours. HbA1C: No results for input(s): HGBA1C in the last 72 hours. CBG: Recent Labs  Lab 08/01/20  0927  GLUCAP 86   Lipid Profile: No results for input(s): CHOL, HDL, LDLCALC, TRIG, CHOLHDL, LDLDIRECT in the last 72 hours. Thyroid Function Tests: No results for input(s): TSH, T4TOTAL, FREET4, T3FREE, THYROIDAB in the last 72 hours. Anemia Panel: No results for input(s): VITAMINB12, FOLATE, FERRITIN, TIBC, IRON, RETICCTPCT in the last 72 hours. Sepsis Labs: No results for input(s): PROCALCITON, LATICACIDVEN in the last 168 hours.  No results found for this or any previous visit (from the past 240 hour(s)).       Radiology Studies: No results found.      Scheduled Meds: . acetylcysteine  4 mL Nebulization BID  . acidophilus  1 capsule Oral TID  . apixaban  5 mg Oral BID  . arformoterol  15 mcg Nebulization BID  . dronabinol  2.5 mg Oral BID AC  . feeding supplement  237 mL Oral TID BM  . gabapentin  300 mg Oral TID  . metoprolol tartrate  50 mg Oral BID  . multivitamin with minerals  1 tablet Oral Daily  . pantoprazole  40 mg  Oral QHS  . QUEtiapine  25 mg Oral QHS   Continuous Infusions:   LOS: 61 days    Time spent: 37 minutes    Marrion Coy, MD [facility]   To contact the attending provider between 7A-7P or the covering provider during after hours 7P-7A, please log into the web site [email or web address] and access using universal [place] password for that web site. If you do not have the password, please call the hospital operator.  08/06/2020, 9:45 AM

## 2020-08-06 NOTE — Progress Notes (Signed)
Cross Cover Due to sever anxiety and likely benzo withdrawal as las dose of valium she had been receiving every 6 hours 2/12, valium restarted at reduced amount 2.5 mg prn every 6 hours.  Suggest to wean the valium as recommended to prevent withdrawal

## 2020-08-07 DIAGNOSIS — J9622 Acute and chronic respiratory failure with hypercapnia: Secondary | ICD-10-CM | POA: Diagnosis not present

## 2020-08-07 DIAGNOSIS — G9341 Metabolic encephalopathy: Secondary | ICD-10-CM | POA: Diagnosis not present

## 2020-08-07 DIAGNOSIS — J690 Pneumonitis due to inhalation of food and vomit: Secondary | ICD-10-CM | POA: Diagnosis not present

## 2020-08-07 DIAGNOSIS — J9621 Acute and chronic respiratory failure with hypoxia: Secondary | ICD-10-CM | POA: Diagnosis not present

## 2020-08-07 LAB — CBC WITH DIFFERENTIAL/PLATELET
Abs Immature Granulocytes: 0.03 10*3/uL (ref 0.00–0.07)
Basophils Absolute: 0.1 10*3/uL (ref 0.0–0.1)
Basophils Relative: 1 %
Eosinophils Absolute: 0.3 10*3/uL (ref 0.0–0.5)
Eosinophils Relative: 3 %
HCT: 37.2 % (ref 36.0–46.0)
Hemoglobin: 11.1 g/dL — ABNORMAL LOW (ref 12.0–15.0)
Immature Granulocytes: 0 %
Lymphocytes Relative: 15 %
Lymphs Abs: 1.4 10*3/uL (ref 0.7–4.0)
MCH: 27 pg (ref 26.0–34.0)
MCHC: 29.8 g/dL — ABNORMAL LOW (ref 30.0–36.0)
MCV: 90.5 fL (ref 80.0–100.0)
Monocytes Absolute: 0.7 10*3/uL (ref 0.1–1.0)
Monocytes Relative: 8 %
Neutro Abs: 6.7 10*3/uL (ref 1.7–7.7)
Neutrophils Relative %: 73 %
Platelets: 360 10*3/uL (ref 150–400)
RBC: 4.11 MIL/uL (ref 3.87–5.11)
RDW: 15.6 % — ABNORMAL HIGH (ref 11.5–15.5)
WBC: 9.3 10*3/uL (ref 4.0–10.5)
nRBC: 0 % (ref 0.0–0.2)

## 2020-08-07 LAB — BASIC METABOLIC PANEL
Anion gap: 7 (ref 5–15)
BUN: 21 mg/dL (ref 8–23)
CO2: 38 mmol/L — ABNORMAL HIGH (ref 22–32)
Calcium: 9.5 mg/dL (ref 8.9–10.3)
Chloride: 96 mmol/L — ABNORMAL LOW (ref 98–111)
Creatinine, Ser: 0.66 mg/dL (ref 0.44–1.00)
GFR, Estimated: >60 mL/min (ref >60)
Glucose, Bld: 92 mg/dL (ref 70–99)
Potassium: 4 mmol/L (ref 3.5–5.1)
Sodium: 141 mmol/L (ref 135–145)

## 2020-08-07 LAB — MAGNESIUM: Magnesium: 2 mg/dL (ref 1.7–2.4)

## 2020-08-07 LAB — PHOSPHORUS: Phosphorus: 3.9 mg/dL (ref 2.5–4.6)

## 2020-08-07 NOTE — TOC Progression Note (Addendum)
Transition of Care [facility]) - Progression Note    Patient Details  Name: Virginia [name] MRN: [id number] Date of Birth: March 07, 1951  Transition of [facility]) CM/SW Contact  Margarito Liner, LCSW Phone Number: 08/07/2020, 10:36 AM  Clinical Narrative:   Faxed corrected NIV order form to [employer].  2:01 pm: [employer] needs patient to use bipap tonight while capped in order to help with insurance authorization for NIV. Dr. Karna Christmas will also order a blood gas. [employer] hoping tonight's results will be all they need but said it might take two nights. Patient has been updated. Left voicemail for daughter-in-law.    Barriers to Discharge: Continued Medical Work up  Expected Discharge Plan and Services   In-house Referral: Clinical Social Work,Hospice / Palliative Care   Post Acute Care Choice: Durable Medical Equipment (Oxygen 4L) Living arrangements for the past 2 months: Apartment                                       Social Determinants of Health (SDOH) Interventions    Readmission Risk Interventions No flowsheet data found.

## 2020-08-07 NOTE — Progress Notes (Signed)
PULMONARY PROGRESS NOTE    Name: Virginia [name] MRN: [id number] DOB: [birth year]     LOS: 38   SUBJECTIVE FINDINGS & SIGNIFICANT EVENTS    Patient description:  70 yo female with severe COPD admitted with acute on chronic hypoxic hypercapnic respiratory failure and acute encephalopathy secondary to narcotic use and AECOPD requiring mechanical intubation  Now s/p trach/PEG working on trach weaning.  On TC at present, anxious. Remains on precedex.  06/27/20- patient is improved.  Signed out to [facility] - Dr Jimmye Norman for pick up in am.   06/29/20- patient is with respiratory distress, she has high volume phlegm from trache, ive suctioned out whats possible via [place] and messaged RT to assist.  RN was able to come in and evaluate patient and she was able to communicate appropriately.   07/07/20- patient is in bed resting. She is clinically better today then yesterday. Resting in bed in no distress.  07/08/20- Patient is lucid and awake.  Son is at bedside we discussed care plan.  CXR in am.   1/16-17/22- patient resting in bed comfortbably no acute events overnight.   07/11/20- patient had episode of mild respiratory distress due to voluminous phlegm expectorated via trache. This was suctioned by RN with recovery. Patient is clear to auscultation with occasional low pitch wheezes.  She is being optimized for d/c.   07/20/20- patient had episode of mild-moderate respiratory distress due to obstructed trache with inspissated phlegm.  She is on 7-8L/min humidifed HFnc.  She is with ENTEROBACTER AEROGENES on trache aspirate culture. ID is following and there is abscence of leucosytosis or fevers in several days.    07/21/20- patient is with increased O2 requirement this morning up to 10L/min.  She is actually clinically  better in appearance and lung auscultation is improved from yesterday.  She is smiling during interview.  She has PT today and may have increased O2 in preparation for therapy. Plan to continue current care plan.   07/23/20- patient is very frustrated this am due to wanting to be [facility] home. She is able to communicate via [employer] valve.  She shares there are multiple people at home that care for her and she wishes to be dcd home. I have asked her to please cooperate and be patient however she is aggitated and wishes to go home.    07/26/2020- patient is sitting up in chair, she is eating lunch without regurgitation.  Daughter is in room during evaluation.  Patient is vocalizing well with PMV.  She is requesting reversal of PEG tube and dc home. She remains on 8L/min [place].  She should have PT/OT/CPT  07/27/20- patient is stable however on 8-10L with trache collar.  She is eating regular food despite trach and has passed thorough speech evaluation.  She requests removal of her PEG tube and has not used it.  Have discussed with Dr. Dwyane Dee and we are working on accommodating patient's request.  She continues to work with PT and OT.  She requests to be discharged home after medical optimization.  07/28/20- patient is imporved, she is down to 5L/min.  She ambulated well today with PT.  She has requested reversal of PEG.     07/29/2020- patient improved, O2 weaned to 5L/min, worked with PT/OT today.  She is improving and vocalizing well, feeling more confident and stronger.  Wants to go home after PEG removed/reversed.   07/30/2020- patient resting in bed in no distress.  Vitals are stable. No overnight  events.    08/01/2020- patient should have cuffed trache removed.  Discussed case with attending physician, case manager , ENT, SLP team and plan is to have NIV set up on outpatient with d/c home.  She has chronic respiratory failure but is now eating with trache and vocalizing with PMV.  She will follow up at  [facility] pulmonary with Dr Raul Del.   08/03/2020- patient is improved.  I have filled out paperwork for NIV trilogy.  Patient wishes to be discharged home as soon as possible, currently she is improved and lucid able to vocalize with PMV well.  She wishes to have trache removed if possible.   08/05/2020- patient is doing good today, shes speaking freely with no significant phlegm impacted in airways.  She is now at 5L/min and shes very happy to see improvement.   08/07/2020- patient seen and examined a bedside, she is stable.  Had episode of anxiety overnight which was remedied.  Met with case management this am and corrected paperwork for NIV to have insurance approval.  Patient is cleared to d/c when primary team is able.   PHYSICAL EXAMINATION   Vital Signs: Temp:  [97.7 F (36.5 C)-98.8 F (37.1 C)] 97.9 F (36.6 C) (02/14 0832) Pulse Rate:  [71-102] 99 (02/14 0832) Resp:  [20-44] 24 (02/14 0442) BP: (110-165)/(69-99) 155/99 (02/14 0832) SpO2:  [54 %-100 %] 96 % (02/14 0832) FiO2 (%):  [28 %] 28 % (02/14 0734)   FiO2 (%):  [28 %] 28 %   Constitutional: anxious frail woman lying in bed  Eyes: eomi, pupils equal Ears, nose, mouth, and throat: trach in place, minmal secretions Cardiovascular: RRR,  Ext warm Respiratory: diminished with prolonged expiratory phase, occasional accessory muscle use Gastrointestinal: soft, abd wrapped after PEG, some mild tenderness at PEG insertion site MSK: muscle wasting Skin: No rashes, normal turgor, scattered bruising Neurologic: moves all 4 ext to command, weak Psychiatric: anxious, answering questions appropriately   Scheduled Meds: . acetylcysteine  4 mL Nebulization BID  . acidophilus  1 capsule Oral TID  . apixaban  5 mg Oral BID  . arformoterol  15 mcg Nebulization BID  . dronabinol  2.5 mg Oral BID AC  . feeding supplement  237 mL Oral TID BM  . gabapentin  300 mg Oral TID  . metoprolol tartrate  50 mg Oral BID  .  multivitamin with minerals  1 tablet Oral Daily  . pantoprazole  40 mg Oral QHS  . QUEtiapine  25 mg Oral QHS   Continuous Infusions:  PRN Meds:.acetaminophen (TYLENOL) oral liquid 160 mg/5 mL, diazepam, ipratropium-albuterol, labetalol, naphazoline-glycerin, nystatin, ondansetron (ZOFRAN) IV    ASSESSMENT AND PLAN   Acute on chronic hypoxemic and hypercapnic respiratory failure thought secondary to polypharmacy long wean from ventilator due to muscular deconditioning now s/p tracheostomy.         -Heavy phlegm production - Glycopyrrolate tid 69m-07/21/20  Dysphagia, moderate protein calorie malnutrition- now s/p PEG  Severe baseline COPD- complicated trach wean - continue COPD carepath  Muscular deconditioning-PT/OT/CPT  Chronic anxiety/depression  Metabolic encephalopathy/agitation- improving  - repeat tracheal aspirate ordered -ENTEROBACTER AEROGENES - Switched from standing duonebs from duonebs to brovana/yupelri - PT/OT up to chair - Continue standing clonazepam, use valium prn for breakthrough - SLP for PMV trials - IV meds for rehab vs. SNF vs. LTACH vs home?  -trilogy NIV for home -  Patient chronically critically ill due to respiratory failure, metabolic encephalopathy Interventions to address this today weaning precedex,  weaning vent Risk of deterioration without these interventions is high    Patient would benefit from [facility]. *This note was dictated using voice recognition software/Dragon.  Despite best efforts to proofread, errors can occur which can change the meaning.  Any change was purely unintentional.     Ottie Glazier, M.D.  Pulmonary & [place]

## 2020-08-07 NOTE — Progress Notes (Signed)
Trach care completed. Patient suctioned and placed on [place] Valve. Bipap placed per order. Patient provided education and instructions. Call bell placed within reach. RN aware. Will continue to monitor.

## 2020-08-07 NOTE — Plan of Care (Signed)
  Problem: Clinical Measurements: Goal: Ability to maintain clinical measurements within normal limits will improve Outcome: Progressing Goal: Will remain free from infection Outcome: Progressing Goal: Diagnostic test results will improve Outcome: Progressing Goal: Respiratory complications will improve Outcome: Progressing Goal: Cardiovascular complication will be avoided Outcome: Progressing   Pt oxygen titrated down to 5lpm/trache with oxygen sat at 90-96%. Trache suctioned twice. Diazepam 2.5 mg/tab given 2x this shift for anxiety.

## 2020-08-07 NOTE — Care Management Important Message (Signed)
Important Message  Patient Details  Name: Virginia [name] MRN: [id number] Date of Birth: [birth year]   Medicare Important Message Given:  Yes     Johnell Comings 08/07/2020, 11:24 AM

## 2020-08-07 NOTE — Progress Notes (Signed)
PROGRESS NOTE    Virginia [name]  YDX:[id number] DOB: [birth year] DOA: 06/06/2020 PCP: Marguarite Arbour, MD   Chief Complaint.  Shortness of breath. Brief Narrative:  This 70 years old female with severe COPD admitted with acute on chronic hypoxic and hypercapnic respiratory failure and acute encephalopathy secondary to narcotic use and advanced COPD requiring mechanical ventilation. She was treated with steroids and IV antibioticsin the ICU. She could not be liberated from the ventilator so tracheostomy was performed and she has been receiving oxygen via trach collar.She had prolonged ICU course.  PCCM pick up 1/14.Patient developed worsening hypoxia on 1/15. Chest x-ray shows bilateral lower lobe infiltrate consistent with aspiration pneumonia. She was started andhas completed antibiotics.  On 1/20:patient had a sudden onset of altered mental status, ABG shows severe metabolic acidosis. Patient was transferred to ICU.  PCCM pickup 1/24: Acute encephalopathy resolved. CT head unremarkable for any acute stroke or bleeding. Mental status has normalized without any focal deficit. COPD appears stable. Patient had episode of desaturation with mucous plugging on 1/29. Improved after suctioning. The decision has been made to keep the PEG tube and consider remove it as outpatient. Not able to place patient, after discussion with patient and the family, the decision is made to go home with home care. Trach catheter has been changed to cuffless.Patient also has a trilogy set up by pulmonology.   Assessment & Plan:   Active Problems:   AF (paroxysmal atrial fibrillation) (HCC)   Anemia of chronic disease   COPD with acute lower respiratory infection (HCC)   Dysphagia   Respiratory failure (HCC)   COPD exacerbation (HCC)   Acute urinary retention   Acute metabolic encephalopathy   Acute on chronic respiratory failure with hypoxia and hypercapnia (HCC)   Aspiration pneumonia  of both lower lobes due to gastric secretions (HCC)   Reactive thrombocytosis   Pressure injury of skin   Diarrhea   Anorexia   Severe protein-calorie malnutrition (HCC)  #1. Acute on chronic respiratory failure with hypoxemia and hypercapnia. Status post tracheostomy and PEG tube placement. COPD exacerbation. Aspiration pneumonia secondary to Enterobacter aerogenes. Dysphagia. Patient condition improved.  She is stable on 5 L oxygen at rest, he need more oxygen with exertion.  Spoke with the nurse, patient still has difficulty suctioning herself.  Her secretion is better.  Discussed with patient and family, I will keep her in the hospital for another day and to learn how to suction.  Planning to let her go home tomorrow. Her nutrition status is better, she is drinking 3 ensures a day in addition to food. Patient burning with urination, check a UA.  #2.  Anorexia with a severe protein calorie malnutrition. As above  3.  Acute metabolic encephalopathy. Severe metabolic acidosis Condition improved     DVT prophylaxis: Eliquis Code Status: Full Family Communication: daughter in law updated Disposition Plan:  .   Status is: Inpatient  Remains inpatient appropriate because:Inpatient level of care appropriate due to severity of illness   Dispo: The patient is from: Home              Anticipated d/c is to: Home              Anticipated d/c date is: 1 day              Patient currently is not medically stable to d/c.   Difficult to place patient Yes        I/O last 3 completed  shifts: In: -  Out: 500 [Urine:500] No intake/output data recorded.     Consultants:   Pulm  Procedures:   Antimicrobials:None  Subjective: Patient condition much improved.  She is currently stable on 5 L oxygen.  However, patient still has difficulty suctioning herself. She had improved appetite, no nausea vomiting abdominal pain. No fever chills. She feels burning with urination, no  frequent urination.  Objective: Vitals:   08/07/20 0442 08/07/20 0734 08/07/20 0832 08/07/20 1159  BP: (!) 165/97  (!) 155/99 (!) 151/98  Pulse: 92  99 77  Resp: (!) 24   18  Temp: 98.4 F (36.9 C)  97.9 F (36.6 C) 98.4 F (36.9 C)  TempSrc:   Oral   SpO2: 90% 92% 96% 95%  Weight:      Height:        Intake/Output Summary (Last 24 hours) at 08/07/2020 1333 Last data filed at 08/06/2020 2030 Gross per 24 hour  Intake --  Output 200 ml  Net -200 ml   Filed Weights   07/16/20 0500 07/17/20 0426 07/18/20 0500  Weight: 56.4 kg 55.4 kg 54.9 kg    Examination:  General exam: Appears calm and comfortable  Respiratory system: Decreased breathing sounds. Respiratory effort normal. Cardiovascular system: S1 & S2 heard, RRR. No JVD, murmurs, rubs, gallops or clicks. No pedal edema. Gastrointestinal system: Abdomen is nondistended, soft and nontender. No organomegaly or masses felt. Normal bowel sounds heard. Central nervous system: Alert and oriented. No focal neurological deficits. Extremities: Symmetric  Skin: No rashes, lesions or ulcers Psychiatry: Judgement and insight appear normal. Mood & affect appropriate.     Data Reviewed: I have personally reviewed following labs and imaging studies  CBC: Recent Labs  Lab 08/01/20 0416 08/02/20 0346 08/07/20 0607  WBC 8.1 7.6 9.3  NEUTROABS 4.8 4.4 6.7  HGB 10.3* 10.1* 11.1*  HCT 33.6* 33.1* 37.2  MCV 91.3 91.9 90.5  PLT 405* 416* 360   Basic Metabolic Panel: Recent Labs  Lab 08/01/20 0416 08/02/20 0346 08/07/20 0607  NA 138 141 141  K 3.6 3.6 4.0  CL 94* 95* 96*  CO2 35* 36* 38*  GLUCOSE 88 86 92  BUN 10 15 21   CREATININE 0.72 0.86 0.66  CALCIUM 9.3 9.2 9.5  MG 1.8  --  2.0  PHOS  --   --  3.9   GFR: Estimated Creatinine Clearance: 52.5 mL/min (by C-G formula based on SCr of 0.66 mg/dL). Liver Function Tests: No results for input(s): AST, ALT, ALKPHOS, BILITOT, PROT, ALBUMIN in the last 168 hours. No  results for input(s): LIPASE, AMYLASE in the last 168 hours. No results for input(s): AMMONIA in the last 168 hours. Coagulation Profile: No results for input(s): INR, PROTIME in the last 168 hours. Cardiac Enzymes: No results for input(s): CKTOTAL, CKMB, CKMBINDEX, TROPONINI in the last 168 hours. BNP (last 3 results) No results for input(s): PROBNP in the last 8760 hours. HbA1C: No results for input(s): HGBA1C in the last 72 hours. CBG: Recent Labs  Lab 08/01/20 0927  GLUCAP 86   Lipid Profile: No results for input(s): CHOL, HDL, LDLCALC, TRIG, CHOLHDL, LDLDIRECT in the last 72 hours. Thyroid Function Tests: No results for input(s): TSH, T4TOTAL, FREET4, T3FREE, THYROIDAB in the last 72 hours. Anemia Panel: No results for input(s): VITAMINB12, FOLATE, FERRITIN, TIBC, IRON, RETICCTPCT in the last 72 hours. Sepsis Labs: No results for input(s): PROCALCITON, LATICACIDVEN in the last 168 hours.  No results found for this or any previous  visit (from the past 240 hour(s)).       Radiology Studies: No results found.      Scheduled Meds: . acetylcysteine  4 mL Nebulization BID  . acidophilus  1 capsule Oral TID  . apixaban  5 mg Oral BID  . arformoterol  15 mcg Nebulization BID  . dronabinol  2.5 mg Oral BID AC  . feeding supplement  237 mL Oral TID BM  . gabapentin  300 mg Oral TID  . metoprolol tartrate  50 mg Oral BID  . multivitamin with minerals  1 tablet Oral Daily  . pantoprazole  40 mg Oral QHS  . QUEtiapine  25 mg Oral QHS   Continuous Infusions:   LOS: 62 days    Time spent: 28 minutes    Marrion Coy, MD [facility]   To contact the attending provider between 7A-7P or the covering provider during after hours 7P-7A, please log into the web site [email or web address] and access using universal [place] password for that web site. If you do not have the password, please call the hospital operator.  08/07/2020, 1:33 PM

## 2020-08-08 ENCOUNTER — Inpatient Hospital Stay: Payer: Medicare Other

## 2020-08-08 DIAGNOSIS — J690 Pneumonitis due to inhalation of food and vomit: Secondary | ICD-10-CM | POA: Diagnosis not present

## 2020-08-08 DIAGNOSIS — J441 Chronic obstructive pulmonary disease with (acute) exacerbation: Secondary | ICD-10-CM | POA: Diagnosis not present

## 2020-08-08 DIAGNOSIS — G9341 Metabolic encephalopathy: Secondary | ICD-10-CM | POA: Diagnosis not present

## 2020-08-08 DIAGNOSIS — J9621 Acute and chronic respiratory failure with hypoxia: Secondary | ICD-10-CM | POA: Diagnosis not present

## 2020-08-08 LAB — CBC WITH DIFFERENTIAL/PLATELET
Abs Immature Granulocytes: 0.05 10*3/uL (ref 0.00–0.07)
Basophils Absolute: 0.1 10*3/uL (ref 0.0–0.1)
Basophils Relative: 1 %
Eosinophils Absolute: 0.5 10*3/uL (ref 0.0–0.5)
Eosinophils Relative: 4 %
HCT: 35.1 % — ABNORMAL LOW (ref 36.0–46.0)
Hemoglobin: 10.7 g/dL — ABNORMAL LOW (ref 12.0–15.0)
Immature Granulocytes: 1 %
Lymphocytes Relative: 18 %
Lymphs Abs: 1.9 10*3/uL (ref 0.7–4.0)
MCH: 27.9 pg (ref 26.0–34.0)
MCHC: 30.5 g/dL (ref 30.0–36.0)
MCV: 91.6 fL (ref 80.0–100.0)
Monocytes Absolute: 0.8 10*3/uL (ref 0.1–1.0)
Monocytes Relative: 8 %
Neutro Abs: 7 10*3/uL (ref 1.7–7.7)
Neutrophils Relative %: 68 %
Platelets: 359 10*3/uL (ref 150–400)
RBC: 3.83 MIL/uL — ABNORMAL LOW (ref 3.87–5.11)
RDW: 15.8 % — ABNORMAL HIGH (ref 11.5–15.5)
WBC: 10.3 10*3/uL (ref 4.0–10.5)
nRBC: 0 % (ref 0.0–0.2)

## 2020-08-08 LAB — URINALYSIS, COMPLETE (UACMP) WITH MICROSCOPIC
Bilirubin Urine: NEGATIVE — AB
Glucose, UA: NEGATIVE mg/dL — AB
Ketones, ur: NEGATIVE mg/dL — AB
Nitrite: NEGATIVE — AB
Protein, ur: NEGATIVE mg/dL — AB
Specific Gravity, Urine: 1.02 (ref 1.005–1.030)
pH: 7.5 (ref 5.0–8.0)

## 2020-08-08 LAB — BLOOD GAS, ARTERIAL
Acid-Base Excess: 13.9 mmol/L — ABNORMAL HIGH (ref 0.0–2.0)
Bicarbonate: 41.2 mmol/L — ABNORMAL HIGH (ref 20.0–28.0)
Delivery systems: POSITIVE
Expiratory PAP: 6
FIO2: 0.28
Inspiratory PAP: 10
Mechanical Rate: 8
O2 Saturation: 95.7 %
Patient temperature: 37
RATE: 8 resp/min
pCO2 arterial: 65 mmHg — ABNORMAL HIGH (ref 32.0–48.0)
pH, Arterial: 7.41 (ref 7.350–7.450)
pO2, Arterial: 79 mmHg — ABNORMAL LOW (ref 83.0–108.0)

## 2020-08-08 LAB — BASIC METABOLIC PANEL
Anion gap: 8 (ref 5–15)
BUN: 21 mg/dL (ref 8–23)
CO2: 36 mmol/L — ABNORMAL HIGH (ref 22–32)
Calcium: 9.3 mg/dL (ref 8.9–10.3)
Chloride: 98 mmol/L (ref 98–111)
Creatinine, Ser: 0.67 mg/dL (ref 0.44–1.00)
GFR, Estimated: >60 mL/min (ref >60)
Glucose, Bld: 92 mg/dL (ref 70–99)
Potassium: 4.1 mmol/L (ref 3.5–5.1)
Sodium: 142 mmol/L (ref 135–145)

## 2020-08-08 LAB — URINALYSIS, MICROSCOPIC (REFLEX)

## 2020-08-08 LAB — MAGNESIUM: Magnesium: 1.9 mg/dL (ref 1.7–2.4)

## 2020-08-08 NOTE — Plan of Care (Signed)
  Problem: Clinical Measurements: Goal: Ability to maintain clinical measurements within normal limits will improve Outcome: Progressing Goal: Will remain free from infection Outcome: Progressing Goal: Diagnostic test results will improve Outcome: Progressing Goal: Respiratory complications will improve Outcome: Progressing Goal: Cardiovascular complication will be avoided Outcome: Progressing   Bipap tolerated overnight. Suctioned per pt once this shift. V/S stable except has episodes tachypnea. Valium given @ 5:58am. BM noted 2x. Unable to get urine sample due to contamination.

## 2020-08-08 NOTE — TOC Progression Note (Signed)
Transition of Care [facility]) - Progression Note    Patient Details  Name: Virginia [name] MRN: [id number] Date of Birth: [birth year]  [facility] of [facility] CM/SW Contact  Margarito Liner, LCSW Phone Number: 08/08/2020, 12:34 PM  Clinical Narrative:  Per [facility] representative, patient has been approved for NIV. They are ready for delivery all other DME that was ordered whenever patient is ready for discharge.    Barriers to Discharge: Continued Medical Work up  Expected Discharge Plan and Services   In-house Referral: Clinical Social Work,Hospice / Palliative Care   Post Acute Care Choice: Durable Medical Equipment (Oxygen 4L) Living arrangements for the past 2 months: Apartment                                       Social Determinants of Health (SDOH) Interventions    Readmission Risk Interventions No flowsheet data found.

## 2020-08-08 NOTE — Progress Notes (Signed)
PROGRESS NOTE    Virginia [name]  BTD:[id number] DOB: 1950/11/06 DOA: 06/06/2020 PCP: Marguarite Arbour, MD   Chief complaint shortness of breath. Brief Narrative:  This 70 years old female with severe COPD admitted with acute on chronic hypoxic and hypercapnic respiratory failure and acute encephalopathy secondary to narcotic use and advanced COPD requiring mechanical ventilation. She was treated with steroids and IV antibioticsin the ICU. She could not be liberated from the ventilator so tracheostomy was performed and she has been receiving oxygen via trach collar.She had prolonged ICU course.  PCCM pick up 1/14.Patient developed worsening hypoxia on 1/15. Chest x-ray shows bilateral lower lobe infiltrate consistent with aspiration pneumonia. She was started andhas completed antibiotics.  On 1/20:patient had a sudden onset of altered mental status, ABG shows severe metabolic acidosis. Patient was transferred to ICU.  PCCM pickup 1/24: Acute encephalopathy resolved. CT head unremarkable for any acute stroke or bleeding. Mental status has normalized without any focal deficit. COPD appears stable. Patient had episode of desaturation with mucous plugging on 1/29. Improved after suctioning. The decision has been made to keep the PEG tube and consider remove it as outpatient. Not able to place patient, after discussion with patient and the family, the decision is made to go home with home care. Trach catheter has been changed to cuffless.Patient also has a trilogy set up by pulmonology.   Assessment & Plan:   Active Problems:   AF (paroxysmal atrial fibrillation) (HCC)   Anemia of chronic disease   COPD with acute lower respiratory infection (HCC)   Dysphagia   Respiratory failure (HCC)   COPD exacerbation (HCC)   Acute urinary retention   Acute metabolic encephalopathy   Acute on chronic respiratory failure with hypoxia and hypercapnia (HCC)   Aspiration pneumonia of  both lower lobes due to gastric secretions (HCC)   Reactive thrombocytosis   Pressure injury of skin   Diarrhea   Anorexia   Severe protein-calorie malnutrition (HCC)  #1. Acute on chronic respiratory failure with hypoxemia and hypercapnia. Status post tracheostomy and PEG tube placement. COPD exacerbation. Aspiration pneumonia secondary to Enterobacter aerogenes. Dysphagia. Patient oxygenation had improved, currently on 5 L oxygen over trach collar.  She is also started on BiPAP at nighttime, she will be on trilogy after discharge from the hospital. She still has significant airway secretion, requiring 3-4 times of suction per day.  She has some confusion intermittently, she will need to be reliably able to perform therapy suction before discharge. Hopefully, her mental status will continue to improve after starting BiPAP at nighttime.  She can be discharged home once she can efficiently suction herself.  #2. Anorexia. Severe protein calorie malnutrition. Condition improving, continue Marinol.  3.  Acute metabolic cephalopathy. Severe metabolic acidosis. Conditions all improved.    DVT prophylaxis: Eliquis Code Status: Full Family Communication:  Disposition Plan:  .   Status is: Inpatient  Remains inpatient appropriate because:Inpatient level of care appropriate due to severity of illness   Dispo: The patient is from: Home              Anticipated d/c is to: Home              Anticipated d/c date is: 2 days              Patient currently is not medically stable to d/c.   Difficult to place patient Yes        I/O last 3 completed shifts: In: 120 [P.O.:120] Out:  600 [Urine:600] Total I/O In: -  Out: 2 [Stool:2]     Consultants:   Pulm  Procedures: Trach/PEG  Antimicrobials: None  Subjective: Patient still has significant airway secretion, patient was not able to suction yesterday (he did well with the prior day).  Her cough is more forceful. She  occasionally gets confused, no headache or dizziness. Her appetite is better, he is drinking all 3 ensures a day in addition to her regular food. She has no abdominal pain or nausea vomiting.  No constipation or diarrhea. No dysuria or hematuria.  Objective: Vitals:   08/08/20 0428 08/08/20 0545 08/08/20 0838 08/08/20 1116  BP: 119/86  (!) 158/100 (!) 155/94  Pulse: 86  100 91  Resp: 18  18   Temp: 98.4 F (36.9 C)  98.8 F (37.1 C) 97.6 F (36.4 C)  TempSrc: Oral  Oral   SpO2: 98% 98% 93% 93%  Weight:      Height:        Intake/Output Summary (Last 24 hours) at 08/08/2020 1248 Last data filed at 08/08/2020 1047 Gross per 24 hour  Intake 120 ml  Output 402 ml  Net -282 ml   Filed Weights   07/16/20 0500 07/17/20 0426 07/18/20 0500  Weight: 56.4 kg 55.4 kg 54.9 kg    Examination:  General exam: Appears calm and comfortable, appears severely malnourished. Respiratory system: Decreased breathing sounds. Respiratory effort normal. Cardiovascular system: S1 & S2 heard, RRR. No JVD, murmurs, rubs, gallops or clicks. No pedal edema. Gastrointestinal system: Abdomen is nondistended, soft and nontender. No organomegaly or masses felt. Normal bowel sounds heard. Central nervous system: Alert and oriented. No focal neurological deficits. Extremities: Symmetric  Skin: No rashes, lesions or ulcers Psychiatry: Mood & affect appropriate.     Data Reviewed: I have personally reviewed following labs and imaging studies  CBC: Recent Labs  Lab 08/02/20 0346 08/07/20 0607 08/08/20 0402  WBC 7.6 9.3 10.3  NEUTROABS 4.4 6.7 7.0  HGB 10.1* 11.1* 10.7*  HCT 33.1* 37.2 35.1*  MCV 91.9 90.5 91.6  PLT 416* 360 359   Basic Metabolic Panel: Recent Labs  Lab 08/02/20 0346 08/07/20 0607 08/08/20 0402  NA 141 141 142  K 3.6 4.0 4.1  CL 95* 96* 98  CO2 36* 38* 36*  GLUCOSE 86 92 92  BUN 15 21 21   CREATININE 0.86 0.66 0.67  CALCIUM 9.2 9.5 9.3  MG  --  2.0 1.9  PHOS  --  3.9   --    GFR: Estimated Creatinine Clearance: 52.5 mL/min (by C-G formula based on SCr of 0.67 mg/dL). Liver Function Tests: No results for input(s): AST, ALT, ALKPHOS, BILITOT, PROT, ALBUMIN in the last 168 hours. No results for input(s): LIPASE, AMYLASE in the last 168 hours. No results for input(s): AMMONIA in the last 168 hours. Coagulation Profile: No results for input(s): INR, PROTIME in the last 168 hours. Cardiac Enzymes: No results for input(s): CKTOTAL, CKMB, CKMBINDEX, TROPONINI in the last 168 hours. BNP (last 3 results) No results for input(s): PROBNP in the last 8760 hours. HbA1C: No results for input(s): HGBA1C in the last 72 hours. CBG: No results for input(s): GLUCAP in the last 168 hours. Lipid Profile: No results for input(s): CHOL, HDL, LDLCALC, TRIG, CHOLHDL, LDLDIRECT in the last 72 hours. Thyroid Function Tests: No results for input(s): TSH, T4TOTAL, FREET4, T3FREE, THYROIDAB in the last 72 hours. Anemia Panel: No results for input(s): VITAMINB12, FOLATE, FERRITIN, TIBC, IRON, RETICCTPCT in the last 72  hours. Sepsis Labs: No results for input(s): PROCALCITON, LATICACIDVEN in the last 168 hours.  No results found for this or any previous visit (from the past 240 hour(s)).       Radiology Studies: No results found.      Scheduled Meds: . acidophilus  1 capsule Oral TID  . apixaban  5 mg Oral BID  . arformoterol  15 mcg Nebulization BID  . dronabinol  2.5 mg Oral BID AC  . feeding supplement  237 mL Oral TID BM  . gabapentin  300 mg Oral TID  . metoprolol tartrate  50 mg Oral BID  . multivitamin with minerals  1 tablet Oral Daily  . pantoprazole  40 mg Oral QHS  . QUEtiapine  25 mg Oral QHS   Continuous Infusions:   LOS: 63 days    Time spent: 28 minutes    Marrion Coy, MD [facility]   To contact the attending provider between 7A-7P or the covering provider during after hours 7P-7A, please log into the web site [email or web address]  and access using universal [place] password for that web site. If you do not have the password, please call the hospital operator.  08/08/2020, 12:48 PM

## 2020-08-08 NOTE — Progress Notes (Addendum)
Nutrition Follow-up  DOCUMENTATION CODES:   Not applicable  INTERVENTION:   Ensure Enlive po TID, each supplement provides 350 kcal and 20 grams of protein  Magic cup TID with meals, each supplement provides 290 kcal and 9 grams of protein  MVI daily   Pt at high refeed risk; recommend monitor potassium, magnesium and phosphorus labs daily until stable  NUTRITION DIAGNOSIS:   Increased nutrient needs related to catabolic illness (COPD) as evidenced by estimated needs. Ongoing.  GOAL:   Patient will meet greater than or equal to 90% of their needs -not met   MONITOR:   PO intake,Supplement acceptance,Labs,Weight trends,Skin,I & O's  ASSESSMENT:   70 year old female with PMHx of COPD, hx tracheostomy tube placement and PEG tube placement in 2017, HTN, depression, RLS, osteoporosis, hepatitis C, A-fib, anxiety, arthritis admitted with acute on chronic hypoxic hypercapnic respiratory failure and acute encephalopathy secondary to narcotic use and acute exacerbation of COPD.   Pt continues to have decreased appetite and oral intake in hospital. Forty eight hour calorie count completed on 2/11; pt was eating 15-41% of her estimated needs on day one and 64-83% of her estimated needs on day two. The majority of patient's calorie and protein intake is coming from the [employer] and Ensure supplements. Yesterday, pt consumed 20% of each meal which was estimated at 257kcal (17% of estimated needs) and 13g/day of protein (17% of estimated needs) for the entire day. Spoke to RN, pt is drinking all of her Ensure today. RD has spoken to both patient and MD multiple times recommending that patient use existing G-tube to maximize her nutrition but pt declines to use tube. Pt will likely need to have supplements available after discharge to insure that she is able to meet her estimated needs. Marinol was initiated 2/13. Per chart, pt is down ~9lbs since admit but appears to be close to her UBW. No new  weight since 1/25.  Medications reviewed and include: risaquad, marinol, protonix, MVI  Labs reviewed: K 4.1 wnl, P 3.9 wnl, Mg 1.9 wnl Hgb 10.7(L), Hct 35.1(L)  Diet Order:   Diet Order            Diet regular Room service appropriate? Yes; Fluid consistency: Thin  Diet effective now                EDUCATION NEEDS:   No education needs have been identified at this time  Skin:  Skin Assessment: Skin Integrity Issues: Skin Integrity Issues:: Other (Comment),Stage II Stage II: left buttocks Incisions: N/A Other: MASD to groin and perineum  Last BM:  2/15- type 4  Height:   Ht Readings from Last 1 Encounters:  07/14/20 5' 2.01" (1.575 m)   Weight:   Wt Readings from Last 1 Encounters:  07/18/20 54.9 kg   Ideal Body Weight:  50 kg  BMI:  Body mass index is 22.13 kg/m.  Estimated Nutritional Needs:   Kcal:  1500-1700kcal/day  Protein:  75-85g/day  Fluid:  1.5-1.7 L/day  Koleen Distance MS, RD, LDN Please refer to [facility] for RD and/or RD on-call/weekend/after hours pager

## 2020-08-08 NOTE — Progress Notes (Signed)
Physical Therapy Treatment Patient Details Name: Virginia [name] MRN: [id number] DOB: 29-Jul-1950 Today's Date: 08/08/2020    History of Present Illness 70 yo female with severe COPD admitted with acute on chronic hypoxic hypercapnic respiratory failure and acute encephalopathy secondary to narcotic use and AECOPD requiring mechanical intubation. Initially intubated from 12/14-12/16 and then again from 12/18-12/23. Now with + trach collar. Recent hospitalization from 12/10-12/12/21. RR secondary to slurred speech and AMS. Transferred to CCU on 1/20 on full vent support.    PT Comments    Pt was long sitting in bed on 5 L trach collar upon arriving." I'm going home today." Discussed with RN who reports pt is not DCing today. RN is planning to do trach care training with patient/family.  She had PMV in place but did remove it for ambulation. Pt was easily able to exit R side of bed. Sat EOB x several minutes prior to standing and ambulating 120 ft on 6 L trach collar. Mobile tanks unable to be on 5 L.  Pt has poor pleth throughout gait training.unreliable sao2 readings. Once returned to room, more accurate pleth with sao2 >88%. Overall she tolerated well but is limited by SOB/anxiety once fatigue. Did discuss at length relaxation techniques and concerns that anxiety is most limiting her progression. Pt states understanding however unwilling to ambulate for 2nd gait trial. Pt was repositioned in bed post session with call bell in reach and RN aware of her abilities. Acute PT will continue to follow and progress as able per POC.    Follow Up Recommendations  Home health PT;Supervision/Assistance - 24 hour;Other (comment)     Equipment Recommendations  None recommended by PT    Recommendations for Other Services       Precautions / Restrictions Precautions Precautions: Fall Precaution Comments: trach Restrictions Weight Bearing Restrictions: No    Mobility  Bed Mobility Overal bed  mobility: Modified Independent       Transfers Overall transfer level: Needs assistance Equipment used: Rolling walker (2 wheeled) Transfers: Sit to/from Stand Sit to Stand: Supervision     Ambulation/Gait Ambulation/Gait assistance: Supervision Gait Distance (Feet): 120 Feet Assistive device: Rolling walker (2 wheeled) Gait Pattern/deviations: Step-through pattern Gait velocity: decreased   General Gait Details: 6 L 28% trach collar during ambulation       Balance Overall balance assessment: Needs assistance Sitting-balance support: Feet supported Sitting balance-Leahy Scale: Good Sitting balance - Comments: no balance deficits sitting EOB   Standing balance support: Bilateral upper extremity supported;During functional activity Standing balance-Leahy Scale: Good Standing balance comment: no LOB with BUE support         Cognition Arousal/Alertness: Awake/alert Behavior During Therapy: WFL for tasks assessed/performed Overall Cognitive Status: Within Functional Limits for tasks assessed      General Comments: alert and oriented x4. Pt is agreeable to PT intervention. able to don/doff PMV independently             Pertinent Vitals/Pain Pain Assessment: No/denies pain Pain Score: 0-No pain Faces Pain Scale: No hurt Pain Location: L ankle           PT Goals (current goals can now be found in the care plan section) Acute Rehab PT Goals Patient Stated Goal: To return home safely Progress towards PT goals: Progressing toward goals    Frequency    Min 2X/week      PT Plan Current plan remains appropriate       AM-PAC PT "6 Clicks" Mobility   Outcome Measure  Help needed turning from your back to your side while in a flat bed without using bedrails?: None Help needed moving from lying on your back to sitting on the side of a flat bed without using bedrails?: None Help needed moving to and from a bed to a chair (including a wheelchair)?: A  Little Help needed standing up from a chair using your arms (e.g., wheelchair or bedside chair)?: A Little Help needed to walk in hospital room?: A Little Help needed climbing 3-5 steps with a railing? : A Little 6 Click Score: 20    End of Session Equipment Utilized During Treatment: Gait belt;Oxygen Activity Tolerance: Patient tolerated treatment well Patient left: in bed;with call bell/phone within reach;with bed alarm set;with nursing/sitter in room Nurse Communication: Mobility status PT Visit Diagnosis: Unsteadiness on feet (R26.81);Muscle weakness (generalized) (M62.81);Difficulty in walking, not elsewhere classified (R26.2)     Time: 2458-0998 PT Time Calculation (min) (ACUTE ONLY): 26 min  Charges:  $Gait Training: 8-22 mins $Therapeutic Activity: 8-22 mins                     Jetta Lout PTA 08/08/20, 3:11 PM

## 2020-08-08 NOTE — Progress Notes (Signed)
PULMONARY PROGRESS NOTE    Name: Virginia [name] MRN: [id number] DOB: May 05, 1951     LOS: 51   SUBJECTIVE FINDINGS & SIGNIFICANT EVENTS    Patient description:  70 yo female with severe COPD admitted with acute on chronic hypoxic hypercapnic respiratory failure and acute encephalopathy secondary to narcotic use and AECOPD requiring mechanical intubation  Now s/p trach/PEG working on trach weaning.  On TC at present, anxious. Remains on precedex.  06/27/20- patient is improved.  Signed out to [facility] - Dr Jimmye Norman for pick up in am.   06/29/20- patient is with respiratory distress, she has high volume phlegm from trache, ive suctioned out whats possible via [place] and messaged RT to assist.  RN was able to come in and evaluate patient and she was able to communicate appropriately.   07/07/20- patient is in bed resting. She is clinically better today then yesterday. Resting in bed in no distress.  07/08/20- Patient is lucid and awake.  Son is at bedside we discussed care plan.  CXR in am.   1/16-17/22- patient resting in bed comfortbably no acute events overnight.   07/11/20- patient had episode of mild respiratory distress due to voluminous phlegm expectorated via trache. This was suctioned by RN with recovery. Patient is clear to auscultation with occasional low pitch wheezes.  She is being optimized for d/c.   07/20/20- patient had episode of mild-moderate respiratory distress due to obstructed trache with inspissated phlegm.  She is on 7-8L/min humidifed HFnc.  She is with ENTEROBACTER AEROGENES on trache aspirate culture. ID is following and there is abscence of leucosytosis or fevers in several days.    07/21/20- patient is with increased O2 requirement this morning up to 10L/min.  She is actually clinically  better in appearance and lung auscultation is improved from yesterday.  She is smiling during interview.  She has PT today and may have increased O2 in preparation for therapy. Plan to continue current care plan.   07/23/20- patient is very frustrated this am due to wanting to be [facility] home. She is able to communicate via [employer] valve.  She shares there are multiple people at home that care for her and she wishes to be dcd home. I have asked her to please cooperate and be patient however she is aggitated and wishes to go home.    07/26/2020- patient is sitting up in chair, she is eating lunch without regurgitation.  Daughter is in room during evaluation.  Patient is vocalizing well with PMV.  She is requesting reversal of PEG tube and dc home. She remains on 8L/min [place].  She should have PT/OT/CPT  07/27/20- patient is stable however on 8-10L with trache collar.  She is eating regular food despite trach and has passed thorough speech evaluation.  She requests removal of her PEG tube and has not used it.  Have discussed with Dr. Dwyane Dee and we are working on accommodating patient's request.  She continues to work with PT and OT.  She requests to be discharged home after medical optimization.  07/28/20- patient is imporved, she is down to 5L/min.  She ambulated well today with PT.  She has requested reversal of PEG.     07/29/2020- patient improved, O2 weaned to 5L/min, worked with PT/OT today.  She is improving and vocalizing well, feeling more confident and stronger.  Wants to go home after PEG removed/reversed.   07/30/2020- patient resting in bed in no distress.  Vitals are stable. No overnight  events.    08/01/2020- patient should have cuffed trache removed.  Discussed case with attending physician, case manager , ENT, SLP team and plan is to have NIV set up on outpatient with d/c home.  She has chronic respiratory failure but is now eating with trache and vocalizing with PMV.  She will follow up at  [facility] clinic pulmonary with Dr Raul Del.   08/03/2020- patient is improved.  I have filled out paperwork for NIV trilogy.  Patient wishes to be discharged home as soon as possible, currently she is improved and lucid able to vocalize with PMV well.  She wishes to have trache removed if possible.   08/05/2020- patient is doing good today, shes speaking freely with no significant phlegm impacted in airways.  She is now at 5L/min and shes very happy to see improvement.   08/07/2020- patient seen and examined a bedside, she is stable.  Had episode of anxiety overnight which was remedied.  Met with case management this am and corrected paperwork for NIV to have insurance approval.  Patient is cleared to d/c when primary team is able.   08/08/2020- patient had extra phlegm prodcution and needed more education today to suction correctly.  She used BIPAP all night states she feels much better this am.  ABG overnight reviewed and is with hypercapnia and hypoxemia chronically.  Will repeat CXR today to confirm no new findings prior to dc.   PHYSICAL EXAMINATION   Vital Signs: Temp:  [98 F (36.7 C)-98.8 F (37.1 C)] 98.8 F (37.1 C) (02/15 0838) Pulse Rate:  [77-106] 100 (02/15 0838) Resp:  [16-28] 18 (02/15 0838) BP: (109-158)/(79-107) 158/100 (02/15 0838) SpO2:  [91 %-98 %] 93 % (02/15 0838) FiO2 (%):  [28 %] 28 % (02/15 0545)   FiO2 (%):  [28 %] 28 %   Constitutional: anxious frail woman lying in bed  Eyes: eomi, pupils equal Ears, nose, mouth, and throat: trach in place, minmal secretions Cardiovascular: RRR,  Ext warm Respiratory: diminished with prolonged expiratory phase, occasional accessory muscle use Gastrointestinal: soft, abd wrapped after PEG, some mild tenderness at PEG insertion site MSK: muscle wasting Skin: No rashes, normal turgor, scattered bruising Neurologic: moves all 4 ext to command, weak Psychiatric: anxious, answering questions appropriately   Scheduled  Meds: . acidophilus  1 capsule Oral TID  . apixaban  5 mg Oral BID  . arformoterol  15 mcg Nebulization BID  . dronabinol  2.5 mg Oral BID AC  . feeding supplement  237 mL Oral TID BM  . gabapentin  300 mg Oral TID  . metoprolol tartrate  50 mg Oral BID  . multivitamin with minerals  1 tablet Oral Daily  . pantoprazole  40 mg Oral QHS  . QUEtiapine  25 mg Oral QHS   Continuous Infusions:  PRN Meds:.acetaminophen (TYLENOL) oral liquid 160 mg/5 mL, diazepam, ipratropium-albuterol, labetalol, naphazoline-glycerin, nystatin, ondansetron (ZOFRAN) IV    ASSESSMENT AND PLAN   Acute on chronic hypoxemic and hypercapnic respiratory failure thought secondary to polypharmacy long wean from ventilator due to muscular deconditioning now s/p tracheostomy.         -Heavy phlegm production - Glycopyrrolate tid 1mg -07/21/20  Dysphagia, moderate protein calorie malnutrition- now s/p PEG  Severe baseline COPD- complicated trach wean - continue COPD carepath  Muscular deconditioning-PT/OT/CPT  Chronic anxiety/depression  Metabolic encephalopathy/agitation- improving  - repeat tracheal aspirate ordered -ENTEROBACTER AEROGENES - Switched from standing duonebs from duonebs to brovana/yupelri - PT/OT up to chair - Continue standing clonazepam,  use valium prn for breakthrough - SLP for PMV trials - IV meds for rehab vs. SNF vs. LTACH vs home?  -trilogy NIV for home -  Patient chronically critically ill due to respiratory failure, metabolic encephalopathy Interventions to address this today weaning precedex, weaning vent Risk of deterioration without these interventions is high    Patient would benefit from [facility]. *This note was dictated using voice recognition software/Dragon.  Despite best efforts to proofread, errors can occur which can change the meaning.  Any change was purely unintentional.     Ottie Glazier, M.D.  Pulmonary & [place]

## 2020-08-09 ENCOUNTER — Encounter: Payer: Self-pay | Admitting: Student

## 2020-08-09 DIAGNOSIS — J441 Chronic obstructive pulmonary disease with (acute) exacerbation: Secondary | ICD-10-CM | POA: Diagnosis not present

## 2020-08-09 DIAGNOSIS — J9621 Acute and chronic respiratory failure with hypoxia: Secondary | ICD-10-CM | POA: Diagnosis not present

## 2020-08-09 DIAGNOSIS — G9341 Metabolic encephalopathy: Secondary | ICD-10-CM | POA: Diagnosis not present

## 2020-08-09 DIAGNOSIS — I480 Paroxysmal atrial fibrillation: Secondary | ICD-10-CM | POA: Diagnosis not present

## 2020-08-09 MED ORDER — ACETAMINOPHEN 325 MG PO TABS: 650.0000 mg | ORAL_TABLET | ORAL | Status: DC | PRN

## 2020-08-09 NOTE — Progress Notes (Signed)
Physical Therapy Treatment Patient Details Name: Virginia [name] MRN: [id number] DOB: 04/24/1951 Today's Date: 08/09/2020    History of Present Illness 70 yo female with severe COPD admitted with acute on chronic hypoxic hypercapnic respiratory failure and acute encephalopathy secondary to narcotic use and AECOPD requiring mechanical intubation. Initially intubated from 12/14-12/16 and then again from 12/18-12/23. Now with + trach collar. Recent hospitalization from 12/10-12/12/21. RR secondary to slurred speech and AMS. Transferred to CCU on 1/20 on full vent support.    PT Comments    Pt tolerated treatment well today but further mobility limited secondary to poor pleth reading; lowest reading at 72% on 10L O2 via trach collar. Upon entry, pt active with RN for trach suction training. Pt agreeable for light activity due to c/o fatigue. Pt able to transfer from [facility] without AD for toileting, but required assistance for pericare for energy conservation. Pt demonstrates mild impulsivity with mobility, requiring increased cueing for safety. Pt continues to demonstrate poor activity tolerance/cardiopulmonary tolerance to exercise due to labored breathing and increased accessory muscle use with light exercise. Cues provided to slow down breathing, due to increased RR, likely due to anxiety. Pt able to ambulate 56ft and perform x20 standing marches in RW, but required x2 seated rest breaks. Pt will continue to benefit from skilled acute PT services to address deficits for return to baseline function. Will continue to recommend DC home with HHPT and 24/7 care.     Follow Up Recommendations  Home health PT;Supervision/Assistance - 24 hour;Other (comment)     Equipment Recommendations  None recommended by PT    Recommendations for Other Services       Precautions / Restrictions Precautions Precautions: Fall Precaution Comments: trach Restrictions Weight Bearing Restrictions: No Other  Position/Activity Restrictions: HR/ RR    Mobility  Bed Mobility Overal bed mobility: Modified Independent Bed Mobility: Supine to Sit;Sit to Supine Rolling: Modified independent (Device/Increase time)   Supine to sit: Modified independent (Device/Increase time) Sit to supine: Modified independent (Device/Increase time)   General bed mobility comments: Mod I to perform supine<>sit transfers with use of BUE for support Patient Response: Anxious  Transfers Overall transfer level: Needs assistance Equipment used: Rolling walker (2 wheeled) Transfers: Sit to/from Stand Sit to Stand: Min guard         General transfer comment: Min guard for safety to perform STS from EOB and BSC with RW; mildly impulsive requiring cues for safety  Ambulation/Gait Ambulation/Gait assistance: Supervision Gait Distance (Feet): 10 Feet Assistive device: Rolling walker (2 wheeled)   Gait velocity: decreased   General Gait Details: Supervision for short distance ambulation in room on 10L O2 via trach collar; further mobility deferred due to quesitonable O2 sats. Pt demonstrates slowed cadence with narrow BOS and early reciprocal gait pattern.     Balance Overall balance assessment: Needs assistance Sitting-balance support: Feet supported Sitting balance-Leahy Scale: Good Sitting balance - Comments: no balance deficits sitting EOB   Standing balance support: Bilateral upper extremity supported;During functional activity Standing balance-Leahy Scale: Good Standing balance comment: no LOB with BUE support in RW                            Cognition Arousal/Alertness: Awake/alert Behavior During Therapy: WFL for tasks assessed/performed Overall Cognitive Status: Within Functional Limits for tasks assessed  General Comments: Agreeable for PT; intermittently impulsive requiring increased cues for safety      Exercises General Exercises -  Lower Extremity Hip Flexion/Marching: AROM;Strengthening;Both;Standing;20 reps (with x1 seated rest break) Other Exercises Other Exercises: Pt able to participate in bed mobility, transfers, and short distance ambulation with RW in room. Pt required titration of O2 to 10L for mobility; unknown accuracy of SpO2 due to poor pleth. However, pt demonstrates labored breathing with increased accessory muscle use, and c/o DOE/SOB. Pt able to perform x20 reps of standing marching with x1 seated rest break. Multiple rest breaks required during mobility. Other Exercises: Pt educated regarding: PT role/POC, DC recommendations, slowed breathing for c/o SOB, telemetry box readings. Other Exercises: Pt able to perform stand pivot transfer from EOB>BSC without AD. Pt required assistance for pericare to save energy for rest of session.    General Comments General comments (skin integrity, edema, etc.): Pt on 7L O2 via trach collar upon PT entry with SpO2 >90%. Poor O2 pleth throughout remainder of session on 10L making it difficult to progress mobility. Pt with RPE of 4-8/10 indicating "moderate-vigorous activity" after ambulating 30ft and performing x20 standing marches with x3 seated rest breaks.      Pertinent Vitals/Pain Pain Assessment: No/denies pain           PT Goals (current goals can now be found in the care plan section) Acute Rehab PT Goals Patient Stated Goal: To return home safely PT Goal Formulation: With patient Time For Goal Achievement: 08/20/20 Potential to Achieve Goals: Fair Additional Goals Additional Goal #1: Pt will be able to perform bed mobility/transfers with mod I and safe technique to improve functional independence Progress towards PT goals: Progressing toward goals    Frequency    Min 2X/week      PT Plan Current plan remains appropriate       AM-PAC PT "6 Clicks" Mobility   Outcome Measure  Help needed turning from your back to your side while in a flat bed  without using bedrails?: None Help needed moving from lying on your back to sitting on the side of a flat bed without using bedrails?: None Help needed moving to and from a bed to a chair (including a wheelchair)?: A Little Help needed standing up from a chair using your arms (e.g., wheelchair or bedside chair)?: A Little Help needed to walk in hospital room?: A Little Help needed climbing 3-5 steps with a railing? : A Little 6 Click Score: 20    End of Session Equipment Utilized During Treatment: Gait belt;Oxygen (10LPM with mobility via trach collar) Activity Tolerance: Patient tolerated treatment well;Patient limited by fatigue Patient left: in bed;with call bell/phone within reach;with bed alarm set;with nursing/sitter in room Nurse Communication: Mobility status PT Visit Diagnosis: Unsteadiness on feet (R26.81);Muscle weakness (generalized) (M62.81);Difficulty in walking, not elsewhere classified (R26.2)     Time: 5784-6962 PT Time Calculation (min) (ACUTE ONLY): 27 min  Charges:  $Therapeutic Activity: 23-37 mins                     Vira Blanco, PT, DPT 4:27 PM,08/09/20

## 2020-08-09 NOTE — Progress Notes (Signed)
Progress Note    Virginia StaggersSandra D [name]  ZOX:[id number] DOB: [birth year]  DOA: 06/06/2020 PCP: Marguarite ArbourSparks, Jeffrey D, MD      Brief Narrative:    Medical records reviewed and are as summarized below:  Virginia StaggersSandra D [name] is a 70 y.o. female with severe COPD admitted with acute on chronic hypoxic and hypercapnic respiratory failure and acute encephalopathy secondary to narcotic use and advanced COPD requiring mechanical ventilation. She was treated with steroids and IV antibioticsin the ICU. She could not be liberated from the ventilator so tracheostomy was performed and she has been receiving oxygen via trach collar.She had prolonged ICU course.  PCCM pick up 1/14.Patient developed worsening hypoxia on 1/15. Chest x-ray shows bilateral lower lobe infiltrate consistent with aspiration pneumonia. She was started andhas completed antibiotics.  On 1/20:patient had a sudden onset of altered mental status, ABG shows severe metabolic acidosis. Patient was transferred to ICU.  PCCM pickup 1/24: Acute encephalopathy resolved. CT head unremarkable for any acute stroke or bleeding. Mental status has normalized without any focal deficit. COPD appears stable. Patient had episode of desaturation with mucous plugging on 1/29. Improved after suctioning. The decision has been made to keep the PEG tube and consider remove it as outpatient.    Assessment/Plan:   Active Problems:   AF (paroxysmal atrial fibrillation) (HCC)   Anemia of chronic disease   COPD with acute lower respiratory infection (HCC)   Dysphagia   Respiratory failure (HCC)   COPD exacerbation (HCC)   Acute urinary retention   Acute metabolic encephalopathy   Acute on chronic respiratory failure with hypoxia and hypercapnia (HCC)   Aspiration pneumonia of both lower lobes due to gastric secretions (HCC)   Reactive thrombocytosis   Pressure injury of skin   Diarrhea   Anorexia   Severe protein-calorie malnutrition  (HCC)   Nutrition Problem: Increased nutrient needs Etiology: catabolic illness (COPD)  Signs/Symptoms: estimated needs   Body mass index is 22.13 kg/m.    #1. Acute on chronic respiratory failure with hypoxemia and hypercapnia. Status post tracheostomy and PEG tube placement. COPD exacerbation. Aspiration pneumonia secondary to Enterobacter aerogenes. Dysphagia. She is on 5 to 6 L/min oxygen via trach collar.  She is requiring BiPAP at night.  She will be discharged on trilogy machine. She still has significant airway secretions requiring about 3-4 times suctioning per day. She has to demonstrate the ability to suction airways frequently without supervision prior to discharge.  #2. Anorexia. Severe protein calorie malnutrition. Encouraged adequate oral intake.  Continue Marinol.  3.  Acute metabolic cephalopathy. Severe metabolic acidosis. Conditions all improved.    Diet Order            Diet regular Room service appropriate? Yes; Fluid consistency: Thin  Diet effective now                    Consultants:  ENT  Infectious disease  Neurologist  Intensivist  Procedures:  Intubation and mechanical ventilation  Tracheostomy on 06/15/2020  PEG tube placement on 06/21/2020    Medications:   . acidophilus  1 capsule Oral TID  . apixaban  5 mg Oral BID  . arformoterol  15 mcg Nebulization BID  . dronabinol  2.5 mg Oral BID AC  . feeding supplement  237 mL Oral TID BM  . gabapentin  300 mg Oral TID  . metoprolol tartrate  50 mg Oral BID  . multivitamin with minerals  1 tablet Oral Daily  .  pantoprazole  40 mg Oral QHS  . QUEtiapine  25 mg Oral QHS   Continuous Infusions:   Anti-infectives (From admission, onward)   Start     Dose/Rate Route Frequency Ordered Stop   07/14/20 1100  piperacillin-tazobactam (ZOSYN) IVPB 3.375 g        3.375 g 12.5 mL/hr over 240 Minutes Intravenous Every 8 hours 07/14/20 0946 07/18/20 2359   07/08/20 1145   amoxicillin-clavulanate (AUGMENTIN) 875-125 MG per tablet 1 tablet  Status:  Discontinued        1 tablet Per Tube Every 12 hours 07/08/20 1055 07/11/20 0803   07/06/20 1530  piperacillin-tazobactam (ZOSYN) IVPB 3.375 g  Status:  Discontinued        3.375 g 12.5 mL/hr over 240 Minutes Intravenous Every 8 hours 07/06/20 1433 07/08/20 1055   06/21/20 1200  ceFAZolin (ANCEF) IVPB 2g/100 mL premix       Note to Pharmacy: Please have at bedside. Will be administered one hour prior to procedure. Endo staff will instruct on when to start infusion.   2 g 200 mL/hr over 30 Minutes Intravenous  Once 06/21/20 0820 06/21/20 2030   06/07/20 1400  azithromycin (ZITHROMAX) 500 mg in sodium chloride 0.9 % 250 mL IVPB  Status:  Discontinued        500 mg 250 mL/hr over 60 Minutes Intravenous Every 24 hours 06/06/20 2030 06/06/20 2044   06/07/20 1400  azithromycin (ZITHROMAX) 500 mg in sodium chloride 0.9 % 250 mL IVPB  Status:  Discontinued        500 mg 250 mL/hr over 60 Minutes Intravenous Every 24 hours 06/06/20 2044 06/08/20 1031   06/06/20 1415  cefTRIAXone (ROCEPHIN) 2 g in sodium chloride 0.9 % 100 mL IVPB        2 g 200 mL/hr over 30 Minutes Intravenous  Once 06/06/20 1407 06/06/20 1514   06/06/20 1415  azithromycin (ZITHROMAX) 500 mg in sodium chloride 0.9 % 250 mL IVPB        500 mg 250 mL/hr over 60 Minutes Intravenous  Once 06/06/20 1407 06/06/20 1708             Family Communication/Anticipated D/C date and plan/Code Status   DVT prophylaxis:  apixaban (ELIQUIS) tablet 5 mg     Code Status: Full Code  Family Communication: None Disposition Plan:    Status is: Inpatient  Remains inpatient appropriate because:Inpatient level of care appropriate due to severity of illness   Dispo: The patient is from: Home              Anticipated d/c is to: Home              Anticipated d/c date is: 2 days              Patient currently is not medically stable to d/c.   Difficult to place  patient Yes           Subjective:   Interval events noted.  Objective:    Vitals:   08/09/20 0412 08/09/20 0745 08/09/20 0816 08/09/20 1122  BP: (!) 151/99  (!) 154/102 (!) 136/97  Pulse: 86  (!) 103 77  Resp: 20  (!) 26 18  Temp: 97.7 F (36.5 C)  98.2 F (36.8 C) 98.3 F (36.8 C)  TempSrc: Oral   Oral  SpO2: 93% 90% 96% 98%  Weight:      Height:       No data found.   Intake/Output Summary (Last  24 hours) at 08/09/2020 1330 Last data filed at 08/09/2020 1100 Gross per 24 hour  Intake 0 ml  Output 252 ml  Net -252 ml   Filed Weights   07/16/20 0500 07/17/20 0426 07/18/20 0500  Weight: 56.4 kg 55.4 kg 54.9 kg    Exam:  GEN: NAD SKIN: Warm dry EYES: No pallor or icterus ENT: MMM,+ trach CV: RRR PULM: CTA B ABD: soft, ND, NT, +BS, + PEG tube CNS: AAO x 3, non focal EXT: No edema or tenderness    Pressure Injury 07/13/20 Buttocks Left Stage 2 -  Partial thickness loss of dermis presenting as a shallow open injury with a red, pink wound bed without slough. (Active)  07/13/20 1030  Location: Buttocks  Location Orientation: Left  Staging: Stage 2 -  Partial thickness loss of dermis presenting as a shallow open injury with a red, pink wound bed without slough.  Wound Description (Comments):   Present on Admission: Yes     Data Reviewed:   I have personally reviewed following labs and imaging studies:  Labs: Labs show the following:   Basic Metabolic Panel: Recent Labs  Lab 08/07/20 0607 08/08/20 0402  NA 141 142  K 4.0 4.1  CL 96* 98  CO2 38* 36*  GLUCOSE 92 92  BUN 21 21  CREATININE 0.66 0.67  CALCIUM 9.5 9.3  MG 2.0 1.9  PHOS 3.9  --    GFR Estimated Creatinine Clearance: 52.5 mL/min (by C-G formula based on SCr of 0.67 mg/dL). Liver Function Tests: No results for input(s): AST, ALT, ALKPHOS, BILITOT, PROT, ALBUMIN in the last 168 hours. No results for input(s): LIPASE, AMYLASE in the last 168 hours. No results for input(s):  AMMONIA in the last 168 hours. Coagulation profile No results for input(s): INR, PROTIME in the last 168 hours.  CBC: Recent Labs  Lab 08/07/20 0607 08/08/20 0402  WBC 9.3 10.3  NEUTROABS 6.7 7.0  HGB 11.1* 10.7*  HCT 37.2 35.1*  MCV 90.5 91.6  PLT 360 359   Cardiac Enzymes: No results for input(s): CKTOTAL, CKMB, CKMBINDEX, TROPONINI in the last 168 hours. BNP (last 3 results) No results for input(s): PROBNP in the last 8760 hours. CBG: No results for input(s): GLUCAP in the last 168 hours. D-Dimer: No results for input(s): DDIMER in the last 72 hours. Hgb A1c: No results for input(s): HGBA1C in the last 72 hours. Lipid Profile: No results for input(s): CHOL, HDL, LDLCALC, TRIG, CHOLHDL, LDLDIRECT in the last 72 hours. Thyroid function studies: No results for input(s): TSH, T4TOTAL, T3FREE, THYROIDAB in the last 72 hours.  Invalid input(s): FREET3 Anemia work up: No results for input(s): VITAMINB12, FOLATE, FERRITIN, TIBC, IRON, RETICCTPCT in the last 72 hours. Sepsis Labs: Recent Labs  Lab 08/07/20 0607 08/08/20 0402  WBC 9.3 10.3    Microbiology No results found for this or any previous visit (from the past 240 hour(s)).  Procedures and diagnostic studies:  DG Chest Port 1 View  Result Date: 08/08/2020 CLINICAL DATA:  COPD EXAM: PORTABLE CHEST 1 VIEW COMPARISON:  07/13/2020 FINDINGS: Tracheostomy is unchanged. Heart is normal size. No confluent opacities or effusions. No acute bony abnormality. IMPRESSION: No active disease. Electronically Signed   By: Charlett Nose M.D.   On: 08/08/2020 13:59               LOS: 64 days   Theresea Trautmann  [facility]   Pager on [email or web address]. If 7PM-7AM, please contact night-coverage at [email or web address]  08/09/2020, 1:30 PM

## 2020-08-09 NOTE — Progress Notes (Signed)
OT Cancellation Note  Patient Details Name: Virginia [name] MRN: [id number] DOB: 04-Sep-1950   Cancelled Treatment:    Reason Eval/Treat Not Completed: Fatigue/lethargy limiting ability to participate  Pt politely declines to participate in OT session at this time citing fatigue as she'd just finished bed bath with nursing student. Will f/u for OT treatment on next available date/time. Thank you.  Rejeana Brock, MS, OTR/L ascom [phone] 08/09/20, 5:49 PM

## 2020-08-09 NOTE — TOC Progression Note (Addendum)
Transition of Care [facility]) - Progression Note    Patient Details  Name: Virginia [name] MRN: [id number] Date of Birth: 1951-01-10  [facility] of [facility] CM/SW Contact  Margarito Liner, LCSW Phone Number: 08/09/2020, 9:26 AM  Clinical Narrative: Received secure chat from RN late yesterday stating that daughter-in-law was asking about getting a hospital bed. [facility] representative said they do not have hospital beds. Sent referral information to [facility] representative to see if she qualifies.    12:28 pm: [facility] can provide a hospital bed. Sent secure chat to MD requesting DME order and for him to [place] narrative. Daughter-in-law is aware.    Barriers to Discharge: Continued Medical Work up  Expected Discharge Plan and Services   In-house Referral: Clinical Social Work,Hospice / Palliative Care   Post Acute Care Choice: Durable Medical Equipment (Oxygen 4L) Living arrangements for the past 2 months: Apartment                                       Social Determinants of Health (SDOH) Interventions    Readmission Risk Interventions No flowsheet data found.

## 2020-08-09 NOTE — Progress Notes (Signed)
 PULMONARY PROGRESS NOTE    Name: Virginia [name] MRN: [id number] DOB: 02/01/1951     LOS: 64   SUBJECTIVE FINDINGS & SIGNIFICANT EVENTS    Patient description:  70 yo female with severe COPD admitted with acute on chronic hypoxic hypercapnic respiratory failure and acute encephalopathy secondary to narcotic use and AECOPD requiring mechanical intubation  Now s/p trach/PEG working on trach weaning.  On TC at present, anxious. Remains on precedex.  06/27/20- patient is improved.  Signed out to [facility] - Dr Williams for pick up in am.   06/29/20- patient is with respiratory distress, she has high volume phlegm from trache, ive suctioned out whats possible via [place] and messaged RT to assist.  RN was able to come in and evaluate patient and she was able to communicate appropriately.   07/07/20- patient is in bed resting. She is clinically better today then yesterday. Resting in bed in no distress.  07/08/20- Patient is lucid and awake.  Son is at bedside we discussed care plan.  CXR in am.   1/16-17/22- patient resting in bed comfortbably no acute events overnight.   07/11/20- patient had episode of mild respiratory distress due to voluminous phlegm expectorated via trache. This was suctioned by RN with recovery. Patient is clear to auscultation with occasional low pitch wheezes.  She is being optimized for d/c.   07/20/20- patient had episode of mild-moderate respiratory distress due to obstructed trache with inspissated phlegm.  She is on 7-8L/min humidifed HFnc.  She is with ENTEROBACTER AEROGENES on trache aspirate culture. ID is following and there is abscence of leucosytosis or fevers in several days.    07/21/20- patient is with increased O2 requirement this morning up to 10L/min.  She is actually clinically  better in appearance and lung auscultation is improved from yesterday.  She is smiling during interview.  She has PT today and may have increased O2 in preparation for therapy. Plan to continue current care plan.   07/23/20- patient is very frustrated this am due to wanting to be DCd home. She is able to communicate via [place] valve.  She shares there are multiple people at home that care for her and she wishes to be dcd home. I have asked her to please cooperate and be patient however she is aggitated and wishes to go home.    07/26/2020- patient is sitting up in chair, she is eating lunch without regurgitation.  Daughter is in room during evaluation.  Patient is vocalizing well with PMV.  She is requesting reversal of PEG tube and dc home. She remains on 8L/min [place].  She should have PT/OT/CPT  07/27/20- patient is stable however on 8-10L with trache collar.  She is eating regular food despite trach and has passed thorough speech evaluation.  She requests removal of her PEG tube and has not used it.  Have discussed with Dr. Kumar and we are working on accommodating patient's request.  She continues to work with PT and OT.  She requests to be discharged home after medical optimization.  07/28/20- patient is imporved, she is down to 5L/min.  She ambulated well today with PT.  She has requested reversal of PEG.     07/29/2020- patient improved, O2 weaned to 5L/min, worked with PT/OT today.  She is improving and vocalizing well, feeling more confident and stronger.  Wants to go home after PEG removed/reversed.   07/30/2020- patient resting in bed in no distress.  Vitals are stable. No overnight   events.    08/01/2020- patient should have cuffed trache removed.  Discussed case with attending physician, case manager , ENT, SLP team and plan is to have NIV set up on outpatient with d/c home.  She has chronic respiratory failure but is now eating with trache and vocalizing with PMV.  She will follow up at  [facility] pulmonary with Dr Raul Del.   08/03/2020- patient is improved.  I have filled out paperwork for NIV trilogy.  Patient wishes to be discharged home as soon as possible, currently she is improved and lucid able to vocalize with PMV well.  She wishes to have trache removed if possible.   08/05/2020- patient is doing good today, shes speaking freely with no significant phlegm impacted in airways.  She is now at 5L/min and shes very happy to see improvement.   08/07/2020- patient seen and examined a bedside, she is stable.  Had episode of anxiety overnight which was remedied.  Met with case management this am and corrected paperwork for NIV to have insurance approval.  Patient is cleared to d/c when primary team is able.   08/08/2020- patient had extra phlegm prodcution and needed more education today to suction correctly.  She used BIPAP all night states she feels much better this am.  ABG overnight reviewed and is with hypercapnia and hypoxemia chronically.  Will repeat CXR today to confirm no new findings prior to dc.   08/09/2020- no acute events overnight, patient stable .  Plan to continue d/c to home.  CXR stable.    PHYSICAL EXAMINATION   Vital Signs: Temp:  [97.6 F (36.4 C)-98.8 F (37.1 C)] 97.7 F (36.5 C) (02/16 0412) Pulse Rate:  [86-100] 86 (02/16 0412) Resp:  [18-20] 20 (02/16 0412) BP: (140-158)/(86-108) 151/99 (02/16 0412) SpO2:  [90 %-96 %] 90 % (02/16 0745) FiO2 (%):  [28 %-35 %] 35 % (02/16 0745)   FiO2 (%):  [28 %-35 %] 35 %   Constitutional: anxious frail woman lying in bed  Eyes: eomi, pupils equal Ears, nose, mouth, and throat: trach in place, minmal secretions Cardiovascular: RRR,  Ext warm Respiratory: diminished with prolonged expiratory phase, occasional accessory muscle use Gastrointestinal: soft, abd wrapped after PEG, some mild tenderness at PEG insertion site MSK: muscle wasting Skin: No rashes, normal turgor, scattered  bruising Neurologic: moves all 4 ext to command, weak Psychiatric: anxious, answering questions appropriately   Scheduled Meds: . acidophilus  1 capsule Oral TID  . apixaban  5 mg Oral BID  . arformoterol  15 mcg Nebulization BID  . dronabinol  2.5 mg Oral BID AC  . feeding supplement  237 mL Oral TID BM  . gabapentin  300 mg Oral TID  . metoprolol tartrate  50 mg Oral BID  . multivitamin with minerals  1 tablet Oral Daily  . pantoprazole  40 mg Oral QHS  . QUEtiapine  25 mg Oral QHS   Continuous Infusions:  PRN Meds:.acetaminophen (TYLENOL) oral liquid 160 mg/5 mL, diazepam, ipratropium-albuterol, labetalol, naphazoline-glycerin, nystatin, ondansetron (ZOFRAN) IV    ASSESSMENT AND PLAN   Acute on chronic hypoxemic and hypercapnic respiratory failure thought secondary to polypharmacy long wean from ventilator due to muscular deconditioning now s/p tracheostomy.         -Heavy phlegm production - Glycopyrrolate tid 69m-07/21/20  Dysphagia, moderate protein calorie malnutrition- now s/p PEG  Severe baseline COPD- complicated trach wean - continue COPD carepath  Muscular deconditioning-PT/OT/CPT  Chronic anxiety/depression  Metabolic encephalopathy/agitation- improving  - repeat  tracheal aspirate ordered -ENTEROBACTER AEROGENES - Switched from standing duonebs from duonebs to brovana/yupelri - PT/OT up to chair - Continue standing clonazepam, use valium prn for breakthrough - SLP for PMV trials - IV meds for rehab vs. SNF vs. LTACH vs home?  -trilogy NIV for home -  Patient chronically critically ill due to respiratory failure, metabolic encephalopathy Interventions to address this today weaning precedex, weaning vent Risk of deterioration without these interventions is high    Patient would benefit from [facility]. *This note was dictated using voice recognition software/Dragon.  Despite best efforts to proofread, errors can occur which can change the meaning.  Any change  was purely unintentional.     Ottie Glazier, M.D.  Pulmonary & [place]

## 2020-08-09 NOTE — Clinical Social Work Note (Signed)
Patient has GERD, COPD, now on a tracheostomy, which requires respositioning in ways not feasible with a normal bed. Head must be elevated at least 30 degrees or aspiration and shortness of breath occurs.

## 2020-08-10 DIAGNOSIS — G9341 Metabolic encephalopathy: Secondary | ICD-10-CM | POA: Diagnosis not present

## 2020-08-10 DIAGNOSIS — I480 Paroxysmal atrial fibrillation: Secondary | ICD-10-CM | POA: Diagnosis not present

## 2020-08-10 DIAGNOSIS — J9621 Acute and chronic respiratory failure with hypoxia: Secondary | ICD-10-CM | POA: Diagnosis not present

## 2020-08-10 DIAGNOSIS — J441 Chronic obstructive pulmonary disease with (acute) exacerbation: Secondary | ICD-10-CM | POA: Diagnosis not present

## 2020-08-10 IMAGING — DX DG CHEST 1V PORT
1 series · 1 of 1 positions shown · non-contrast
Comparison: Chest radiograph dated 06/24/2017 and CT dated
07/30/2017

CLINICAL DATA: 68-year-old female with shortness of breath

EXAM:
PORTABLE CHEST 1 VIEW

[chest ap]
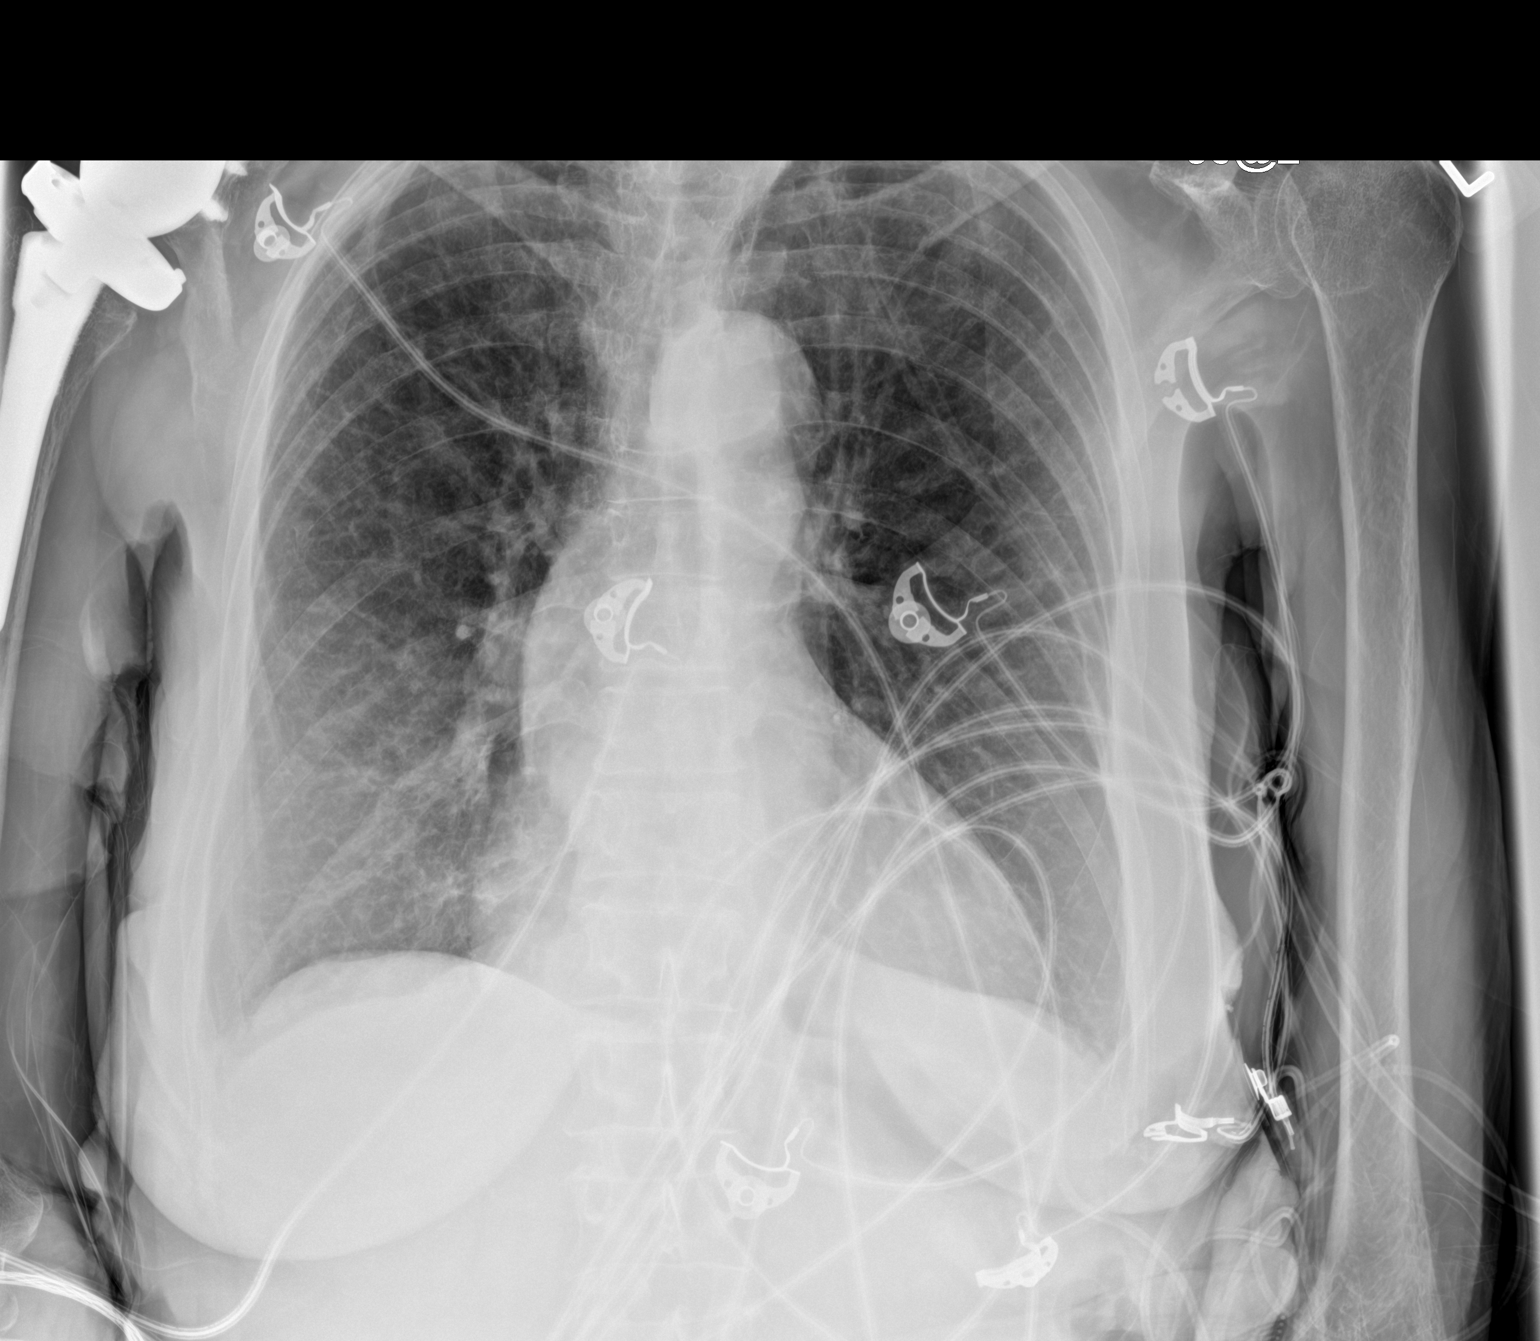

[1 of 1 positions shown; findings below may reference images not displayed]

FINDINGS: Background of emphysema. Diffuse chronic interstitial coarsening.
Mild edema is not excluded. Small bilateral pleural effusions may be
present. No focal consolidation or pneumothorax. Mild cardiomegaly.
The aorta is tortuous. Osteopenia with degenerative changes of the
spine. Right shoulder arthroplasty. No acute osseous pathology.
IMPRESSION: 1. Emphysema. No focal consolidation or pneumothorax.
2. Possible small bilateral pleural effusions.
3. Mild cardiomegaly.

## 2020-08-10 MED ORDER — DILTIAZEM HCL ER COATED BEADS 120 MG PO CP24
120.0000 mg | ORAL_CAPSULE | Freq: Every day | ORAL | Status: DC
  Administered 2020-08-10 – 2020-08-11 (×2): 120 mg via ORAL
  Filled 2020-08-10 (×2): qty 1

## 2020-08-10 MED ORDER — LISINOPRIL 10 MG PO TABS
10.0000 mg | ORAL_TABLET | Freq: Every day | ORAL | Status: DC
  Administered 2020-08-10 – 2020-08-11 (×2): 10 mg via ORAL
  Filled 2020-08-10 (×2): qty 1

## 2020-08-10 MED ORDER — IPRATROPIUM-ALBUTEROL 0.5-2.5 (3) MG/3ML IN SOLN: 3.0000 mL | Freq: Four times a day (QID) | RESPIRATORY_TRACT | Status: DC | PRN

## 2020-08-10 MED ORDER — DIAZEPAM 5 MG PO TABS: 2.5000 mg | ORAL_TABLET | Freq: Three times a day (TID) | ORAL | Status: DC | PRN

## 2020-08-10 NOTE — TOC Progression Note (Addendum)
Transition of Care [facility]) - Progression Note    Patient Details  Name: Virginia [name] MRN: [id number] Date of Birth: 1950/08/15  Transition of Care [facility]) CM/SW Contact  Margarito Liner, LCSW Phone Number: 08/10/2020, 11:18 AM  Clinical Narrative:   Asked MD to edit DME order for hospital bed and check "gel overlay."  12:36 pm: MD fixed hospital bed order. He said patient is stable for discharge. [employer], [employer], and [employer] representatives are aware. Discharge will depend on DME delivery. Received call from daughter-in-law. She is taking off work tomorrow to get patient home and settled. She and her husband are taking down her current bed at home this afternoon to make room for the hospital bed.   4:31 pm: [employer] will come to home when patient arrives for DME delivery and education. Hospital bed on [employer] truck but no estimated delivery time yet. Gave daughter-in-law phone number for logistics team to try and get estimated delivery time.    Barriers to Discharge: Continued Medical Work up  Expected Discharge Plan and Services   In-house Referral: Clinical Social Work,Hospice / Palliative Care   Post Acute Care Choice: Durable Medical Equipment (Oxygen 4L) Living arrangements for the past 2 months: Apartment                                       Social Determinants of Health (SDOH) Interventions    Readmission Risk Interventions No flowsheet data found.

## 2020-08-10 NOTE — Progress Notes (Addendum)
Progress Note    Virginia [name]  IRW:[id number] DOB: Mar 12, 1951  DOA: 06/06/2020 PCP: Marguarite Arbour, MD      Brief Narrative:    Medical records reviewed and are as summarized below:  Virginia [name] is a 70 y.o. female with severe COPD admitted with acute on chronic hypoxic and hypercapnic respiratory failure and acute encephalopathy secondary to narcotic use and advanced COPD requiring mechanical ventilation. She was treated with steroids and IV antibioticsin the ICU. She could not be liberated from the ventilator so tracheostomy was performed and she has been receiving oxygen via trach collar.She had prolonged ICU course.  PCCM pick up 1/14.Patient developed worsening hypoxia on 1/15. Chest x-ray shows bilateral lower lobe infiltrate consistent with aspiration pneumonia. She was started andhas completed antibiotics.  On 1/20:patient had a sudden onset of altered mental status, ABG shows severe metabolic acidosis. Patient was transferred to ICU.  PCCM pickup 1/24: Acute encephalopathy resolved. CT head unremarkable for any acute stroke or bleeding. Mental status has normalized without any focal deficit. COPD appears stable. Patient had episode of desaturation with mucous plugging on 1/29. Improved after suctioning. The decision has been made to keep the PEG tube and consider remove it as outpatient.    Assessment/Plan:   Active Problems:   AF (paroxysmal atrial fibrillation) (HCC)   Anemia of chronic disease   COPD with acute lower respiratory infection (HCC)   Dysphagia   Respiratory failure (HCC)   COPD exacerbation (HCC)   Acute urinary retention   Acute metabolic encephalopathy   Acute on chronic respiratory failure with hypoxia and hypercapnia (HCC)   Aspiration pneumonia of both lower lobes due to gastric secretions (HCC)   Reactive thrombocytosis   Pressure injury of skin   Diarrhea   Anorexia   Severe protein-calorie malnutrition  (HCC)   Nutrition Problem: Increased nutrient needs Etiology: catabolic illness (COPD)  Signs/Symptoms: estimated needs   Body mass index is 22.13 kg/m.    #1. Acute on chronic respiratory failure with hypoxemia and hypercapnia. Status post tracheostomy and PEG tube placement. COPD exacerbation. Aspiration pneumonia secondary to Enterobacter aerogenes. Dysphagia. She is on 6 L/min oxygen via trach collar.  Oxygen level dropped to 77% when she came back from the bathroom.  Oxygen saturation improved 100% on 9 L/min oxygen.  She uses BiPAP at night.  She will be discharged on trilogy machine.   So far, patient has been able to demonstrate ability to suction her airway   Anorexia. Severe protein calorie malnutrition. Encouraged adequate oral intake.  Continue Marinol.  Acute metabolic cephalopathy. Severe metabolic acidosis. Conditions all improved.  Hypertension Resume Cardizem and lisinopril.  Continue metoprolol  Paroxysmal atrial fibrillation Continue Eliquis and metoprolol.  Resume home Cardizem.  Plan of care was discussed with pulmonologist, Dr. Karna Christmas.  Patient is stable for discharge to home.  Case discussed with Child psychotherapist.  Equipment has to be delivered to her house before she can be discharged. Plan of care was discussed with her daughter-in-law, Ms. Sherri Walker.     Diet Order            Diet - low sodium heart healthy           Diet regular Room service appropriate? Yes; Fluid consistency: Thin  Diet effective now                    Consultants:  ENT  Infectious disease  Neurologist  Intensivist  Procedures:  Intubation and mechanical ventilation  Tracheostomy on 06/15/2020  PEG tube placement on 06/21/2020    Medications:   . acidophilus  1 capsule Oral TID  . apixaban  5 mg Oral BID  . arformoterol  15 mcg Nebulization BID  . diltiazem  120 mg Oral Daily  . dronabinol  2.5 mg Oral BID AC  . feeding supplement  237  mL Oral TID BM  . gabapentin  300 mg Oral TID  . lisinopril  10 mg Oral Daily  . metoprolol tartrate  50 mg Oral BID  . multivitamin with minerals  1 tablet Oral Daily  . pantoprazole  40 mg Oral QHS  . QUEtiapine  25 mg Oral QHS   Continuous Infusions:   Anti-infectives (From admission, onward)   Start     Dose/Rate Route Frequency Ordered Stop   07/14/20 1100  piperacillin-tazobactam (ZOSYN) IVPB 3.375 g        3.375 g 12.5 mL/hr over 240 Minutes Intravenous Every 8 hours 07/14/20 0946 07/18/20 2359   07/08/20 1145  amoxicillin-clavulanate (AUGMENTIN) 875-125 MG per tablet 1 tablet  Status:  Discontinued        1 tablet Per Tube Every 12 hours 07/08/20 1055 07/11/20 0803   07/06/20 1530  piperacillin-tazobactam (ZOSYN) IVPB 3.375 g  Status:  Discontinued        3.375 g 12.5 mL/hr over 240 Minutes Intravenous Every 8 hours 07/06/20 1433 07/08/20 1055   06/21/20 1200  ceFAZolin (ANCEF) IVPB 2g/100 mL premix       Note to Pharmacy: Please have at bedside. Will be administered one hour prior to procedure. Endo staff will instruct on when to start infusion.   2 g 200 mL/hr over 30 Minutes Intravenous  Once 06/21/20 0820 06/21/20 2030   06/07/20 1400  azithromycin (ZITHROMAX) 500 mg in sodium chloride 0.9 % 250 mL IVPB  Status:  Discontinued        500 mg 250 mL/hr over 60 Minutes Intravenous Every 24 hours 06/06/20 2030 06/06/20 2044   06/07/20 1400  azithromycin (ZITHROMAX) 500 mg in sodium chloride 0.9 % 250 mL IVPB  Status:  Discontinued        500 mg 250 mL/hr over 60 Minutes Intravenous Every 24 hours 06/06/20 2044 06/08/20 1031   06/06/20 1415  cefTRIAXone (ROCEPHIN) 2 g in sodium chloride 0.9 % 100 mL IVPB        2 g 200 mL/hr over 30 Minutes Intravenous  Once 06/06/20 1407 06/06/20 1514   06/06/20 1415  azithromycin (ZITHROMAX) 500 mg in sodium chloride 0.9 % 250 mL IVPB        500 mg 250 mL/hr over 60 Minutes Intravenous  Once 06/06/20 1407 06/06/20 1708              Family Communication/Anticipated D/C date and plan/Code Status   DVT prophylaxis:  apixaban (ELIQUIS) tablet 5 mg     Code Status: Full Code  Family Communication: None Disposition Plan:    Status is: Inpatient  Remains inpatient appropriate because:Inpatient level of care appropriate due to severity of illness   Dispo: The patient is from: Home              Anticipated d/c is to: Home              Anticipated d/c date is: 2 days              Patient currently is not medically stable to d/c.   Difficult to  place patient Yes           Subjective:   No complaints.  She said she has not had a lot of secretions and so she has not been doing a lot of suctioning.  Objective:    Vitals:   08/10/20 0752 08/10/20 1347 08/10/20 1417 08/10/20 1537  BP:  (!) 162/103 (!) 158/110 (!) 153/98  Pulse:  93 (!) 102 99  Resp:  20    Temp:  98.6 F (37 C)  98.8 F (37.1 C)  TempSrc:  Oral    SpO2: 96% 95% 100% 99%  Weight:      Height:       No data found.   Intake/Output Summary (Last 24 hours) at 08/10/2020 1603 Last data filed at 08/10/2020 0954 Gross per 24 hour  Intake 240 ml  Output 0 ml  Net 240 ml   Filed Weights   07/16/20 0500 07/17/20 0426 07/18/20 0500  Weight: 56.4 kg 55.4 kg 54.9 kg    Exam:  GEN: NAD SKIN: Warm and dry EYES: EOMI ENT: MMM, +trach CV: RRR PULM: CTA B ABD: soft, ND, NT, +BS, +PEG tube CNS: AAO x 3, non focal EXT: No edema or tenderness       Pressure Injury 07/13/20 Buttocks Left Stage 2 -  Partial thickness loss of dermis presenting as a shallow open injury with a red, pink wound bed without slough. (Active)  07/13/20 1030  Location: Buttocks  Location Orientation: Left  Staging: Stage 2 -  Partial thickness loss of dermis presenting as a shallow open injury with a red, pink wound bed without slough.  Wound Description (Comments):   Present on Admission: Yes     Data Reviewed:   I have  personally reviewed following labs and imaging studies:  Labs: Labs show the following:   Basic Metabolic Panel: Recent Labs  Lab 08/07/20 0607 08/08/20 0402  NA 141 142  K 4.0 4.1  CL 96* 98  CO2 38* 36*  GLUCOSE 92 92  BUN 21 21  CREATININE 0.66 0.67  CALCIUM 9.5 9.3  MG 2.0 1.9  PHOS 3.9  --    GFR Estimated Creatinine Clearance: 52.5 mL/min (by C-G formula based on SCr of 0.67 mg/dL). Liver Function Tests: No results for input(s): AST, ALT, ALKPHOS, BILITOT, PROT, ALBUMIN in the last 168 hours. No results for input(s): LIPASE, AMYLASE in the last 168 hours. No results for input(s): AMMONIA in the last 168 hours. Coagulation profile No results for input(s): INR, PROTIME in the last 168 hours.  CBC: Recent Labs  Lab 08/07/20 0607 08/08/20 0402  WBC 9.3 10.3  NEUTROABS 6.7 7.0  HGB 11.1* 10.7*  HCT 37.2 35.1*  MCV 90.5 91.6  PLT 360 359   Cardiac Enzymes: No results for input(s): CKTOTAL, CKMB, CKMBINDEX, TROPONINI in the last 168 hours. BNP (last 3 results) No results for input(s): PROBNP in the last 8760 hours. CBG: No results for input(s): GLUCAP in the last 168 hours. D-Dimer: No results for input(s): DDIMER in the last 72 hours. Hgb A1c: No results for input(s): HGBA1C in the last 72 hours. Lipid Profile: No results for input(s): CHOL, HDL, LDLCALC, TRIG, CHOLHDL, LDLDIRECT in the last 72 hours. Thyroid function studies: No results for input(s): TSH, T4TOTAL, T3FREE, THYROIDAB in the last 72 hours.  Invalid input(s): FREET3 Anemia work up: No results for input(s): VITAMINB12, FOLATE, FERRITIN, TIBC, IRON, RETICCTPCT in the last 72 hours. Sepsis Labs: Recent Labs  Lab 08/07/20 [phone]  08/08/20 0402  WBC 9.3 10.3    Microbiology No results found for this or any previous visit (from the past 240 hour(s)).  Procedures and diagnostic studies:  No results found.             LOS: 65 days   Latona Krichbaum  [employer]ered loss adjuster  on [email or web address]. If 7PM-7AM, please contact night-coverage at [email or web address]     08/10/2020, 4:03 PM

## 2020-08-10 NOTE — Progress Notes (Addendum)
Patient able to suction trach twice with RN at bedside. Tolerated well. No issues.   Madie Reno, RN

## 2020-08-10 NOTE — Discharge Summary (Incomplete)
Physician Discharge Summary  Virginia [name] DZH:[id number] DOB: Feb 22, 1951 DOA: 06/06/2020  PCP: Marguarite Arbour, MD  Admit date: 06/06/2020 Discharge date: 08/10/2020  Discharge disposition: ***   Recommendations for Outpatient Follow-Up:   1. *** (include homehealth, outpatient follow-up instructions, specific recommendations for PCP to follow-up on, etc.) 2. LACE score = ***, corresponding to a *** % of re-admission within 30 days.   (http://tools.farmacologiaclinica.info/index.php?sid=[email or web address])   Discharge Diagnosis:   Active Problems:   AF (paroxysmal atrial fibrillation) (HCC)   Anemia of chronic disease   COPD with acute lower respiratory infection (HCC)   Dysphagia   Respiratory failure (HCC)   COPD exacerbation (HCC)   Acute urinary retention   Acute metabolic encephalopathy   Acute on chronic respiratory failure with hypoxia and hypercapnia (HCC)   Aspiration pneumonia of both lower lobes due to gastric secretions (HCC)   Reactive thrombocytosis   Pressure injury of skin   Diarrhea   Anorexia   Severe protein-calorie malnutrition (HCC)    Discharge Condition: Stable.  Diet recommendation:  Diet Order            Diet - low sodium heart healthy           Diet regular Room service appropriate? Yes; Fluid consistency: Thin  Diet effective now                   Code Status: Full Code     Hospital Course:   *** Virginia [name], daughter-in-law   Medical Consultants:    ***   Discharge Exam:    Vitals:   08/10/20 0752 08/10/20 1347 08/10/20 1417 08/10/20 1537  BP:  (!) 162/103 (!) 158/110 (!) 153/98  Pulse:  93 (!) 102 99  Resp:  20    Temp:  98.6 F (37 C)  98.8 F (37.1 C)  TempSrc:  Oral    SpO2: 96% 95% 100% 99%  Weight:      Height:         GEN: NAD SKIN: No rash*** EYES: EOMI*** ENT: MMM CV: RRR PULM: CTA B ABD: soft, ND, NT, +BS CNS: AAO x 3, non focal EXT: No edema or tenderness***   The results of  significant diagnostics from this hospitalization (including imaging, microbiology, ancillary and laboratory) are listed below for reference.     Procedures and Diagnostic Studies:   ECHOCARDIOGRAM COMPLETE  Result Date: 06/07/2020    ECHOCARDIOGRAM REPORT   Patient Name:   Virginia [name] Date of Exam: 06/07/2020 Medical Rec #:  [id number]         Height:       62.0 in Accession #:    [id number]        Weight:       129.8 lb Date of Birth:  1951/04/02         BSA:          1.591 m Patient Age:    69 years          BP:           84/55 mmHg Patient Gender: F                 HR:           106 bpm. Exam Location:  [facility] Procedure: 2D Echo, Cardiac Doppler and Color Doppler Indications:     Acute respiratory insufficiency 518.82  History:         Patient has prior history of Echocardiogram examinations, most  recent 11/10/2016. COPD, Arrythmias:Atrial Fibrillation; Risk                  Factors:Hypertension.  Sonographer:     Cristela Blue RDCS (AE) Referring Phys:  [id number] Eugenie Norrie Diagnosing Phys: Alwyn Pea MD  Sonographer Comments: Technically challenging study due to limited acoustic windows, no apical window, no parasternal window and echo performed with patient supine and on artificial respirator. Subcostal was the only obtainable view. IMPRESSIONS  1. Left ventricular ejection fraction, by estimation, is 65 to 70%. The left ventricle has normal function. The left ventricle has no regional wall motion abnormalities. Left ventricular diastolic function could not be evaluated.  2. Right ventricular systolic function is normal. The right ventricular size is normal.  3. The mitral valve is normal in structure. No evidence of mitral valve regurgitation.  4. The aortic valve is normal in structure. Aortic valve regurgitation is not visualized. Conclusion(s)/Recommendation(s): Poor windows for evaluation of left ventricular function by transthoracic echocardiography. Would recommend an  alternative means of evaluation. FINDINGS  Left Ventricle: Left ventricular ejection fraction, by estimation, is 65 to 70%. The left ventricle has normal function. The left ventricle has no regional wall motion abnormalities. The left ventricular internal cavity size was small. There is no left ventricular hypertrophy. Left ventricular diastolic function could not be evaluated. Right Ventricle: The right ventricular size is normal. No increase in right ventricular wall thickness. Right ventricular systolic function is normal. Left Atrium: Left atrial size was normal in size. Right Atrium: Right atrial size was normal in size. Pericardium: There is no evidence of pericardial effusion. Mitral Valve: The mitral valve is normal in structure. No evidence of mitral valve regurgitation. Tricuspid Valve: The tricuspid valve is normal in structure. Tricuspid valve regurgitation is trivial. Aortic Valve: The aortic valve is normal in structure. Aortic valve regurgitation is not visualized. Pulmonic Valve: The pulmonic valve was grossly normal. Pulmonic valve regurgitation is not visualized. Aorta: The ascending aorta was not well visualized. IAS/Shunts: No atrial level shunt detected by color flow Doppler.  LEFT VENTRICLE PLAX 2D LVIDd:         2.72 cm LVIDs:         1.69 cm LV PW:         1.01 cm LV IVS:        0.93 cm  LEFT ATRIUM         Index LA diam:    2.90 cm 1.82 cm/m  PULMONIC VALVE PV Vmax:        0.75 m/s PV Peak grad:   2.3 mmHg RVOT Peak grad: 4 mmHg  TRICUSPID VALVE TR Peak grad:   25.0 mmHg TR Vmax:        250.00 cm/s Alwyn Pea MD Electronically signed by Alwyn Pea MD Signature Date/Time: 06/07/2020/7:27:52 PM    Final      Labs:   Basic Metabolic Panel: Recent Labs  Lab 08/07/20 0607 08/08/20 0402  NA 141 142  K 4.0 4.1  CL 96* 98  CO2 38* 36*  GLUCOSE 92 92  BUN 21 21  CREATININE 0.66 0.67  CALCIUM 9.5 9.3  MG 2.0 1.9  PHOS 3.9  --    GFR Estimated Creatinine Clearance:  52.5 mL/min (by C-G formula based on SCr of 0.67 mg/dL). Liver Function Tests: No results for input(s): AST, ALT, ALKPHOS, BILITOT, PROT, ALBUMIN in the last 168 hours. No results for input(s): LIPASE, AMYLASE in the last 168 hours. No  results for input(s): AMMONIA in the last 168 hours. Coagulation profile No results for input(s): INR, PROTIME in the last 168 hours.  CBC: Recent Labs  Lab 08/07/20 0607 08/08/20 0402  WBC 9.3 10.3  NEUTROABS 6.7 7.0  HGB 11.1* 10.7*  HCT 37.2 35.1*  MCV 90.5 91.6  PLT 360 359   Cardiac Enzymes: No results for input(s): CKTOTAL, CKMB, CKMBINDEX, TROPONINI in the last 168 hours. BNP: Invalid input(s): POCBNP CBG: No results for input(s): GLUCAP in the last 168 hours. D-Dimer No results for input(s): DDIMER in the last 72 hours. Hgb A1c No results for input(s): HGBA1C in the last 72 hours. Lipid Profile No results for input(s): CHOL, HDL, LDLCALC, TRIG, CHOLHDL, LDLDIRECT in the last 72 hours. Thyroid function studies No results for input(s): TSH, T4TOTAL, T3FREE, THYROIDAB in the last 72 hours.  Invalid input(s): FREET3 Anemia work up No results for input(s): VITAMINB12, FOLATE, FERRITIN, TIBC, IRON, RETICCTPCT in the last 72 hours. Microbiology No results found for this or any previous visit (from the past 240 hour(s)).   Discharge Instructions:   Discharge Instructions    Diet - low sodium heart healthy   Complete by: As directed    Discharge instructions   Complete by: As directed    Keep head of hospital bed at 30 degrees angle   Discharge wound care:   Complete by: As directed    Keep wounds clean and dry.   For home use only DME Hospital bed   Complete by: As directed    Length of Need: Lifetime   Bed type: Semi-electric   Support Surface: Gel Overlay   Increase activity slowly   Complete by: As directed      Allergies as of 08/10/2020   No Known Allergies     Medication List    STOP taking these medications    levofloxacin 500 MG tablet Commonly known as: Levaquin   oxyCODONE-acetaminophen 10-325 MG tablet Commonly known as: PERCOCET   predniSONE 10 MG tablet Commonly known as: DELTASONE   predniSONE 5 MG tablet Commonly known as: DELTASONE     TAKE these medications   albuterol 108 (90 Base) MCG/ACT inhaler Commonly known as: VENTOLIN HFA Inhale 2 puffs into the lungs every 4 (four) hours as needed for wheezing or shortness of breath.   amitriptyline 25 MG tablet Commonly known as: ELAVIL Take 25 mg by mouth at bedtime.   apixaban 5 MG Tabs tablet Commonly known as: Eliquis Take 1 tablet (5 mg total) by mouth 2 (two) times daily.   diazepam 5 MG tablet Commonly known as: VALIUM Take 0.5 tablets (2.5 mg total) by mouth every 8 (eight) hours as needed for anxiety. What changed: how much to take   diltiazem 120 MG 24 hr capsule Commonly known as: CARDIZEM CD Take 120 mg by mouth daily.   estradiol 1 MG tablet Commonly known as: ESTRACE Take 1 mg by mouth daily.   furosemide 20 MG tablet Commonly known as: LASIX Take 20 mg by mouth daily.   gabapentin 300 MG capsule Commonly known as: NEURONTIN Take 300 mg by mouth 3 (three) times daily.   ipratropium-albuterol 0.5-2.5 (3) MG/3ML Soln Commonly known as: DUONEB Take 3 mLs by nebulization every 6 (six) hours as needed.   lisinopril 10 MG tablet Commonly known as: ZESTRIL Take 10 mg by mouth daily.   MAGnesium-Oxide 400 (241.3 Mg) MG tablet Generic drug: magnesium oxide Take 400 mg by mouth daily.   metoprolol tartrate 50 MG  tablet Commonly known as: LOPRESSOR Take 50 mg by mouth 2 (two) times daily.   multivitamin tablet Take 1 tablet by mouth daily.   omeprazole 40 MG capsule Commonly known as: PRILOSEC Take 40 mg by mouth daily.   QUEtiapine 25 MG tablet Commonly known as: SEROQUEL Take 25 mg by mouth at bedtime.   Trelegy Ellipta 100-62.5-25 MCG/INH Aepb Generic drug:  Fluticasone-Umeclidin-Vilant Inhale 1 puff into the lungs daily.            Durable Medical Equipment  (From admission, onward)         Start     Ordered   08/10/20 0000  For home use only DME Hospital bed       Question Answer Comment  Length of Need Lifetime   Bed type Semi-electric   Support Surface: Gel Overlay      08/10/20 1225   08/02/20 1430  For home use only DME Trach supplies  (For Home Use Only DME Trach Supplies)  Once       Question Answer Comment  Trach Type Shiley   Back Up Trach Type Shiley   Trach Supplies/Equipment for Home Use Suction Catheters   Trach Supplies/Equipment for Home Use Medical Suction Machine   Trach Supplies/Equipment for Home Use Tracheostomy Drain Dressings   Trach Supplies/Equipment for Home Use Tracheostomy Care Cleaning Kits   Trach Supplies/Equipment for Home Use Speaking Valve   Trach Supplies/Equipment for Home Use HME Device   Trach Supplies/Equipment for Home Use Humidification (oxygen 5 liters via trach collar to provide specified % - if tracheostomy is capped with occlusive cap during day, a separate [place] order will need to be provided)   Suction Catheter Size 12 French (for size 5)   Cleaning Kits 30   Oxygen %  8 L     08/02/20 1431   08/02/20 1237  For home use only DME oxygen  Once       Question Answer Comment  Length of Need 12 Months   Mode or (Route) Mask   Liters per Minute 8   Oxygen delivery system Gas      08/02/20 1236           Discharge Care Instructions  (From admission, onward)         Start     Ordered   08/10/20 0000  Discharge wound care:       Comments: Keep wounds clean and dry.   08/10/20 1225            Time coordinating discharge: ***  Signed:  Olando Willems  [facility] 08/10/2020, 4:14 PM   Pager on [email or web address]. If 7PM-7AM, please contact night-coverage at [email or web address]

## 2020-08-10 NOTE — Progress Notes (Signed)
PULMONARY PROGRESS NOTE    Name: Virginia [name] MRN: [id number] DOB: 1950-11-01     LOS: 37   SUBJECTIVE FINDINGS & SIGNIFICANT EVENTS    Patient description:  70 yo female with severe COPD admitted with acute on chronic hypoxic hypercapnic respiratory failure and acute encephalopathy secondary to narcotic use and AECOPD requiring mechanical intubation  Now s/p trach/PEG working on trach weaning.  On TC at present, anxious. Remains on precedex.  06/27/20- patient is improved.  Signed out to [facility] - Dr Jimmye Norman for pick up in am.   06/29/20- patient is with respiratory distress, she has high volume phlegm from trache, ive suctioned out whats possible via [place] and messaged RT to assist.  RN was able to come in and evaluate patient and she was able to communicate appropriately.   07/07/20- patient is in bed resting. She is clinically better today then yesterday. Resting in bed in no distress.  07/08/20- Patient is lucid and awake.  Son is at bedside we discussed care plan.  CXR in am.   1/16-17/22- patient resting in bed comfortbably no acute events overnight.   07/11/20- patient had episode of mild respiratory distress due to voluminous phlegm expectorated via trache. This was suctioned by RN with recovery. Patient is clear to auscultation with occasional low pitch wheezes.  She is being optimized for d/c.   07/20/20- patient had episode of mild-moderate respiratory distress due to obstructed trache with inspissated phlegm.  She is on 7-8L/min humidifed HFnc.  She is with ENTEROBACTER AEROGENES on trache aspirate culture. ID is following and there is abscence of leucosytosis or fevers in several days.    07/21/20- patient is with increased O2 requirement this morning up to 10L/min.  She is actually clinically  better in appearance and lung auscultation is improved from yesterday.  She is smiling during interview.  She has PT today and may have increased O2 in preparation for therapy. Plan to continue current care plan.   07/23/20- patient is very frustrated this am due to wanting to be [employer] home. She is able to communicate via [employer] valve.  She shares there are multiple people at home that care for her and she wishes to be dcd home. I have asked her to please cooperate and be patient however she is aggitated and wishes to go home.    07/26/2020- patient is sitting up in chair, she is eating lunch without regurgitation.  Daughter is in room during evaluation.  Patient is vocalizing well with PMV.  She is requesting reversal of PEG tube and dc home. She remains on 8L/min [place].  She should have PT/OT/CPT  07/27/20- patient is stable however on 8-10L with trache collar.  She is eating regular food despite trach and has passed thorough speech evaluation.  She requests removal of her PEG tube and has not used it.  Have discussed with Dr. Dwyane Dee and we are working on accommodating patient's request.  She continues to work with PT and OT.  She requests to be discharged home after medical optimization.  07/28/20- patient is imporved, she is down to 5L/min.  She ambulated well today with PT.  She has requested reversal of PEG.     07/29/2020- patient improved, O2 weaned to 5L/min, worked with PT/OT today.  She is improving and vocalizing well, feeling more confident and stronger.  Wants to go home after PEG removed/reversed.   07/30/2020- patient resting in bed in no distress.  Vitals are stable. No overnight  events.    08/01/2020- patient should have cuffed trache removed.  Discussed case with attending physician, case manager , ENT, SLP team and plan is to have NIV set up on outpatient with d/c home.  She has chronic respiratory failure but is now eating with trache and vocalizing with PMV.  She will follow up at  [facility] with Dr Raul Del.   08/03/2020- patient is improved.  I have filled out paperwork for NIV trilogy.  Patient wishes to be discharged home as soon as possible, currently she is improved and lucid able to vocalize with PMV well.  She wishes to have trache removed if possible.   08/05/2020- patient is doing good today, shes speaking freely with no significant phlegm impacted in airways.  She is now at 5L/min and shes very happy to see improvement.   08/07/2020- patient seen and examined a bedside, she is stable.  Had episode of anxiety overnight which was remedied.  Met with case management this am and corrected paperwork for NIV to have insurance approval.  Patient is cleared to d/c when primary team is able.   08/08/2020- patient had extra phlegm prodcution and needed more education today to suction correctly.  She used BIPAP all night states she feels much better this am.  ABG overnight reviewed and is with hypercapnia and hypoxemia chronically.  Will repeat CXR today to confirm no new findings prior to dc.   08/09/2020- no acute events overnight, patient stable .  Plan to continue d/c to home.  CXR stable.    08/10/2020- patient being optimized for dc.  Reviewed care plan with Dr Mal Misty today  PHYSICAL EXAMINATION   Vital Signs: Temp:  [97.9 F (36.6 C)-98.8 F (37.1 C)] 98.8 F (37.1 C) (02/17 1537) Pulse Rate:  [83-104] 99 (02/17 1537) Resp:  [20-28] 20 (02/17 1347) BP: (125-172)/(86-115) 153/98 (02/17 1537) SpO2:  [90 %-100 %] 99 % (02/17 1537) FiO2 (%):  [35 %] 35 % (02/17 0752)   FiO2 (%):  [35 %] 35 %   Constitutional: anxious frail woman lying in bed  Eyes: eomi, pupils equal Ears, nose, mouth, and throat: trach in place, minmal secretions Cardiovascular: RRR,  Ext warm Respiratory: diminished with prolonged expiratory phase, occasional accessory muscle use Gastrointestinal: soft, abd wrapped after PEG, some mild tenderness at PEG insertion site MSK:  muscle wasting Skin: No rashes, normal turgor, scattered bruising Neurologic: moves all 4 ext to command, weak Psychiatric: anxious, answering questions appropriately   Scheduled Meds: . acidophilus  1 capsule Oral TID  . apixaban  5 mg Oral BID  . arformoterol  15 mcg Nebulization BID  . diltiazem  120 mg Oral Daily  . dronabinol  2.5 mg Oral BID AC  . feeding supplement  237 mL Oral TID BM  . gabapentin  300 mg Oral TID  . lisinopril  10 mg Oral Daily  . metoprolol tartrate  50 mg Oral BID  . multivitamin with minerals  1 tablet Oral Daily  . pantoprazole  40 mg Oral QHS  . QUEtiapine  25 mg Oral QHS   Continuous Infusions:  PRN Meds:.acetaminophen, diazepam, ipratropium-albuterol, labetalol, naphazoline-glycerin, nystatin, ondansetron (ZOFRAN) IV    ASSESSMENT AND PLAN   Acute on chronic hypoxemic and hypercapnic respiratory failure thought secondary to polypharmacy long wean from ventilator due to muscular deconditioning now s/p tracheostomy.         -Heavy phlegm production - Glycopyrrolate tid 57m-07/21/20  Dysphagia, moderate protein calorie malnutrition- now s/p PEG  Severe baseline COPD- complicated trach wean - continue COPD carepath  Muscular deconditioning-PT/OT/CPT  Chronic anxiety/depression  Metabolic encephalopathy/agitation- improving  - repeat tracheal aspirate ordered -ENTEROBACTER AEROGENES - Switched from standing duonebs from duonebs to brovana/yupelri - PT/OT up to chair - Continue standing clonazepam, use valium prn for breakthrough - SLP for PMV trials - IV meds for rehab vs. SNF vs. LTACH vs home?  -trilogy NIV for home -  Patient chronically critically ill due to respiratory failure, metabolic encephalopathy Interventions to address this today weaning precedex, weaning vent Risk of deterioration without these interventions is high    Patient would benefit from [facility]. *This note was dictated using voice recognition software/Dragon.   Despite best efforts to proofread, errors can occur which can change the meaning.  Any change was purely unintentional.     Ottie Glazier, M.D.  [facility] [place]

## 2020-08-10 NOTE — Care Management Important Message (Signed)
Important Message  Patient Details  Name: Virginia [name] MRN: [id number] Date of Birth: [birth year]   Medicare Important Message Given:  Yes     Johnell Comings 08/10/2020, 1:06 PM

## 2020-08-10 NOTE — Plan of Care (Signed)
  Problem: Health Behavior/Discharge Planning: Goal: Ability to manage health-related needs will improve Outcome: Progressing   Problem: Clinical Measurements: Goal: Respiratory complications will improve Outcome: Progressing Goal: Cardiovascular complication will be avoided Outcome: Progressing   Problem: Nutrition: Goal: Adequate nutrition will be maintained Outcome: Progressing   Problem: Coping: Goal: Level of anxiety will decrease Outcome: Progressing   Problem: Pain Managment: Goal: General experience of comfort will improve Outcome: Progressing   Problem: Safety: Goal: Ability to remain free from injury will improve Outcome: Progressing

## 2020-08-11 DIAGNOSIS — I480 Paroxysmal atrial fibrillation: Secondary | ICD-10-CM | POA: Diagnosis not present

## 2020-08-11 DIAGNOSIS — J9621 Acute and chronic respiratory failure with hypoxia: Secondary | ICD-10-CM | POA: Diagnosis not present

## 2020-08-11 DIAGNOSIS — G9341 Metabolic encephalopathy: Secondary | ICD-10-CM | POA: Diagnosis not present

## 2020-08-11 DIAGNOSIS — J690 Pneumonitis due to inhalation of food and vomit: Secondary | ICD-10-CM | POA: Diagnosis not present

## 2020-08-11 NOTE — Discharge Summary (Addendum)
Physician Discharge Summary  Virginia [name] BMW:[id number] DOB: Dec 01, 1950 DOA: 06/06/2020  PCP: Marguarite Arbour, MD  Admit date: 06/06/2020 Discharge date: 08/11/2020  Discharge disposition: Home with home health therapy   Recommendations for Outpatient Follow-Up:   Follow-up with Dr. Meredeth Ide, pulmonologist on Tuesday, 08/15/2020   Discharge Diagnosis:   Active Problems:   AF (paroxysmal atrial fibrillation) (HCC)   Anemia of chronic disease   COPD with acute lower respiratory infection (HCC)   Dysphagia   Respiratory failure (HCC)   COPD exacerbation (HCC)   Acute urinary retention   Acute metabolic encephalopathy   Acute on chronic respiratory failure with hypoxia and hypercapnia (HCC)   Aspiration pneumonia of both lower lobes due to gastric secretions (HCC)   Reactive thrombocytosis   Pressure injury of skin   Diarrhea   Anorexia   Severe protein-calorie malnutrition (HCC)    Discharge Condition: Stable.  Diet recommendation:  Diet Order            Diet - low sodium heart healthy           Diet regular Room service appropriate? Yes; Fluid consistency: Thin  Diet effective now                   Code Status: Full Code     Hospital Course:   Ms. Virginia [name] is a 70 year old woman with history of atrial fibrillation on Eliquis, fibromyalgia, severe COPD, chronic hypoxic and hypercapnic respiratory failure, history of acute respiratory failure requiring mechanical ventilation and prior history of tracheostomy.  She was admitted to the hospital with acute on chronic hypoxic and hypercapnic respiratory failure requiring mechanical ventilation.  She was treated with steroids and IV antibiotics.  She had a prolonged ICU course.  She could not be liberated from the ventilator so tracheostomy was performed on 06/15/2020 and she has been receiving oxygen via trach collar.  A PEG tube was placed for severe dysphagia.  She was transferred to the medical  floor for further management on 07/07/2020.  Unfortunately, she developed worsening hypoxia on 07/09/2019 and chest x-ray showed bilateral lower lobe infiltrate consistent with aspiration pneumonia.  She was restarted on empiric IV antibiotics for aspiration pneumonia.  Hospital course was complicated again by lethargy, severe metabolic acidosis and she was transferred back to the ICU for further management on 07/13/2020.  She was transferred back to MedSurg unit on 07/17/2020.  Acute encephalopathy has resolved.  She is back to her baseline mental status.  Dysphagia has improved and she has been able to eat regular food without any problems.  Overall, her condition has improved significantly.  She is requiring about 6 L/min oxygen via trach collar.  She still has occasional episodes where her oxygen saturation will drop with activity.  However, oxygen saturation recovers quickly when she stands up oxygen to 8 to 9 L/min.  She said she has a new oxygen device that can go up to 10 L/min.  She has been using BiPAP at night.  She has been able to suction herself when the need arises.  Of note, she was not having a lot of secretions prior to discharge.  She will be discharged with a trilogy machine that she can use at night.  A hospital bed has also been delivered to her house.  She is eager to be discharged because she is feeling better.  Pulmonologist had been following the patient closely.  Case was discussed with Dr. Karna Christmas, pulmonologist, who said  patient is okay for discharge from his standpoint.  Patient understands that she needs to see her pulmonologist, Dr. Meredeth Ide, as an outpatient within 5 days of discharge.     Medical Consultants:    Intensivist  ENT surgeon  Infectious disease  Neurologist    Discharge Exam:    Vitals:   08/11/20 0500 08/11/20 0615 08/11/20 0730 08/11/20 0959  BP:  110/70  104/64  Pulse:  80 93 (!) 102  Resp:  Temp:  98.4 F (36.9 C)  98.3 F (36.8  C)  TempSrc:  Oral  Oral  SpO2: 99% 94% 93% 98%  Weight:      Height:         GEN: NAD SKIN: Warm and dry EYES: EOMI ENT: MMM CV: RRR PULM: CTA B ABD: soft, ND, NT, +BS, + PEG tube CNS: AAO x 3, non focal EXT: No edema or tenderness   The results of significant diagnostics from this hospitalization (including imaging, microbiology, ancillary and laboratory) are listed below for reference.     Procedures and Diagnostic Studies:   ECHOCARDIOGRAM COMPLETE  Result Date: 06/07/2020    ECHOCARDIOGRAM REPORT   Patient Name:   Virginia [name] Date of Exam: 06/07/2020 Medical Rec #:  [id number]         Height:       62.0 in Accession #:    [id number]        Weight:       129.8 lb Date of Birth:  1950/08/25         BSA:          1.591 m Patient Age:    70 years          BP:           84/55 mmHg Patient Gender: F                 HR:           106 bpm. Exam Location:  [facility] Procedure: 2D Echo, Cardiac Doppler and Color Doppler Indications:     Acute respiratory insufficiency 518.82  History:         Patient has prior history of Echocardiogram examinations, most                  recent 11/10/2016. COPD, Arrythmias:Atrial Fibrillation; Risk                  Factors:Hypertension.  Sonographer:     Cristela Blue RDCS (AE) Referring Phys:  [id number] Eugenie Norrie Diagnosing Phys: Alwyn Pea MD  Sonographer Comments: Technically challenging study due to limited acoustic windows, no apical window, no parasternal window and echo performed with patient supine and on artificial respirator. Subcostal was the only obtainable view. IMPRESSIONS  1. Left ventricular ejection fraction, by estimation, is 65 to 70%. The left ventricle has normal function. The left ventricle has no regional wall motion abnormalities. Left ventricular diastolic function could not be evaluated.  2. Right ventricular systolic function is normal. The right ventricular size is normal.  3. The mitral valve is normal in structure. No  evidence of mitral valve regurgitation.  4. The aortic valve is normal in structure. Aortic valve regurgitation is not visualized. Conclusion(s)/Recommendation(s): Poor windows for evaluation of left ventricular function by transthoracic echocardiography. Would recommend an alternative means of evaluation. FINDINGS  Left Ventricle: Left ventricular ejection fraction, by estimation, is 65 to 70%. The left ventricle has normal function.  The left ventricle has no regional wall motion abnormalities. The left ventricular internal cavity size was small. There is no left ventricular hypertrophy. Left ventricular diastolic function could not be evaluated. Right Ventricle: The right ventricular size is normal. No increase in right ventricular wall thickness. Right ventricular systolic function is normal. Left Atrium: Left atrial size was normal in size. Right Atrium: Right atrial size was normal in size. Pericardium: There is no evidence of pericardial effusion. Mitral Valve: The mitral valve is normal in structure. No evidence of mitral valve regurgitation. Tricuspid Valve: The tricuspid valve is normal in structure. Tricuspid valve regurgitation is trivial. Aortic Valve: The aortic valve is normal in structure. Aortic valve regurgitation is not visualized. Pulmonic Valve: The pulmonic valve was grossly normal. Pulmonic valve regurgitation is not visualized. Aorta: The ascending aorta was not well visualized. IAS/Shunts: No atrial level shunt detected by color flow Doppler.  LEFT VENTRICLE PLAX 2D LVIDd:         2.72 cm LVIDs:         1.69 cm LV PW:         1.01 cm LV IVS:        0.93 cm  LEFT ATRIUM         Index LA diam:    2.90 cm 1.82 cm/m  PULMONIC VALVE PV Vmax:        0.75 m/s PV Peak grad:   2.3 mmHg RVOT Peak grad: 4 mmHg  TRICUSPID VALVE TR Peak grad:   25.0 mmHg TR Vmax:        250.00 cm/s Alwyn Pea MD Electronically signed by Alwyn Pea MD Signature Date/Time: 06/07/2020/7:27:52 PM    Final       Labs:   Basic Metabolic Panel: Recent Labs  Lab 08/07/20 0607 08/08/20 0402  NA 141 142  K 4.0 4.1  CL 96* 98  CO2 38* 36*  GLUCOSE 92 92  BUN 21 21  CREATININE 0.66 0.67  CALCIUM 9.5 9.3  MG 2.0 1.9  PHOS 3.9  --    GFR Estimated Creatinine Clearance: 52.5 mL/min (by C-G formula based on SCr of 0.67 mg/dL). Liver Function Tests: No results for input(s): AST, ALT, ALKPHOS, BILITOT, PROT, ALBUMIN in the last 168 hours. No results for input(s): LIPASE, AMYLASE in the last 168 hours. No results for input(s): AMMONIA in the last 168 hours. Coagulation profile No results for input(s): INR, PROTIME in the last 168 hours.  CBC: Recent Labs  Lab 08/07/20 0607 08/08/20 0402  WBC 9.3 10.3  NEUTROABS 6.7 7.0  HGB 11.1* 10.7*  HCT 37.2 35.1*  MCV 90.5 91.6  PLT 360 359   Cardiac Enzymes: No results for input(s): CKTOTAL, CKMB, CKMBINDEX, TROPONINI in the last 168 hours. BNP: Invalid input(s): POCBNP CBG: No results for input(s): GLUCAP in the last 168 hours. D-Dimer No results for input(s): DDIMER in the last 72 hours. Hgb A1c No results for input(s): HGBA1C in the last 72 hours. Lipid Profile No results for input(s): CHOL, HDL, LDLCALC, TRIG, CHOLHDL, LDLDIRECT in the last 72 hours. Thyroid function studies No results for input(s): TSH, T4TOTAL, T3FREE, THYROIDAB in the last 72 hours.  Invalid input(s): FREET3 Anemia work up No results for input(s): VITAMINB12, FOLATE, FERRITIN, TIBC, IRON, RETICCTPCT in the last 72 hours. Microbiology No results found for this or any previous visit (from the past 240 hour(s)).   Discharge Instructions:   Discharge Instructions    Diet - low sodium heart healthy  Complete by: As directed    Discharge instructions   Complete by: As directed    Keep head of hospital bed at 30 degrees angle   Discharge wound care:   Complete by: As directed    Keep wounds clean and dry.   For home use only DME Hospital bed   Complete  by: As directed    Length of Need: Lifetime   Bed type: Semi-electric   Support Surface: Gel Overlay   Increase activity slowly   Complete by: As directed      Allergies as of 08/11/2020   No Known Allergies     Medication List    STOP taking these medications   levofloxacin 500 MG tablet Commonly known as: Levaquin   oxyCODONE-acetaminophen 10-325 MG tablet Commonly known as: PERCOCET   predniSONE 10 MG tablet Commonly known as: DELTASONE   predniSONE 5 MG tablet Commonly known as: DELTASONE     TAKE these medications   albuterol 108 (90 Base) MCG/ACT inhaler Commonly known as: VENTOLIN HFA Inhale 2 puffs into the lungs every 4 (four) hours as needed for wheezing or shortness of breath.   amitriptyline 25 MG tablet Commonly known as: ELAVIL Take 25 mg by mouth at bedtime.   apixaban 5 MG Tabs tablet Commonly known as: Eliquis Take 1 tablet (5 mg total) by mouth 2 (two) times daily.   diazepam 5 MG tablet Commonly known as: VALIUM Take 0.5 tablets (2.5 mg total) by mouth every 8 (eight) hours as needed for anxiety. What changed: how much to take   diltiazem 120 MG 24 hr capsule Commonly known as: CARDIZEM CD Take 120 mg by mouth daily.   estradiol 1 MG tablet Commonly known as: ESTRACE Take 1 mg by mouth daily.   furosemide 20 MG tablet Commonly known as: LASIX Take 20 mg by mouth daily.   gabapentin 300 MG capsule Commonly known as: NEURONTIN Take 300 mg by mouth 3 (three) times daily.   ipratropium-albuterol 0.5-2.5 (3) MG/3ML Soln Commonly known as: DUONEB Take 3 mLs by nebulization every 6 (six) hours as needed.   lisinopril 10 MG tablet Commonly known as: ZESTRIL Take 10 mg by mouth daily.   MAGnesium-Oxide 400 (241.3 Mg) MG tablet Generic drug: magnesium oxide Take 400 mg by mouth daily.   metoprolol tartrate 50 MG tablet Commonly known as: LOPRESSOR Take 50 mg by mouth 2 (two) times daily.   multivitamin tablet Take 1 tablet by  mouth daily.   omeprazole 40 MG capsule Commonly known as: PRILOSEC Take 40 mg by mouth daily.   QUEtiapine 25 MG tablet Commonly known as: SEROQUEL Take 25 mg by mouth at bedtime.   Trelegy Ellipta 100-62.5-25 MCG/INH Aepb Generic drug: Fluticasone-Umeclidin-Vilant Inhale 1 puff into the lungs daily.            Durable Medical Equipment  (From admission, onward)         Start     Ordered   08/10/20 0000  For home use only DME Hospital bed       Question Answer Comment  Length of Need Lifetime   Bed type Semi-electric   Support Surface: Gel Overlay      08/10/20 1225   08/02/20 1430  For home use only DME Trach supplies  (For Home Use Only DME Trach Supplies)  Once       Question Answer Comment  Trach Type Shiley   Back Up Trach Type Shiley   Trach Supplies/Equipment for Home Use  Suction Catheters   Trach Supplies/Equipment for Home Use Medical Suction Machine   Trach Supplies/Equipment for Home Use Tracheostomy Drain Dressings   Trach Supplies/Equipment for Home Use Tracheostomy Care Cleaning Kits   Trach Supplies/Equipment for Home Use Speaking Valve   Trach Supplies/Equipment for Home Use HME Device   Trach Supplies/Equipment for Home Use Humidification (oxygen 5 liters via trach collar to provide specified % - if tracheostomy is capped with occlusive cap during day, a separate [place] order will need to be provided)   Suction Catheter Size 12 French (for size 5)   Cleaning Kits 30   Oxygen %  8 L     08/02/20 1431   08/02/20 1237  For home use only DME oxygen  Once       Question Answer Comment  Length of Need 12 Months   Mode or (Route) Mask   Liters per Minute 8   Oxygen delivery system Gas      08/02/20 1236           Discharge Care Instructions  (From admission, onward)         Start     Ordered   08/10/20 0000  Discharge wound care:       Comments: Keep wounds clean and dry.   08/10/20 1225          Follow-up Information    Mertie Moores, MD. Schedule an appointment as soon as possible for a visit on 08/15/2020.   Specialty: Specialist Contact information: [address] [place] [address] [phone]                Time coordinating discharge: 35 minutes  Signed:  Lurene Shadow  [facility] 08/11/2020, 10:27 AM   Pager on [email or web address]. If 7PM-7AM, please contact night-coverage at [email or web address]

## 2020-08-11 NOTE — TOC Transition Note (Signed)
Transition of Care [facility]) - CM/SW Discharge Note   Patient Details  Name: Virginia [name] MRN: [id number] Date of Birth: 1950-07-31  Transition of [facility] CM/SW Contact:  Margarito Liner, LCSW Phone Number: 08/11/2020, 11:50 AM   Clinical Narrative:  Patient has orders to discharge home today. EMS transport set up for 2:30. [facility] is aware and will have respiratory therapist come out to the home when she arrives. This CSW leaving at 1:00 today. Asked RN to notify CSW covering when EMS arrives to pick her up so that [facility] can be notified. [facility] is aware of discharge for today. No further concerns. CSW signing off.   Final next level of care: Home w Home Health Services Barriers to Discharge: Barriers Resolved   Patient Goals and CMS Choice     Choice offered to / list presented to : Patient,Adult Children  Discharge Placement                Patient to be transferred to facility by: EMS Name of family member notified: [name] Patient and family notified of of transfer: 08/11/20  Discharge Plan and Services In-house Referral: Clinical Social Work,Hospice / Palliative Care   Post Acute Care Choice: Durable Medical Equipment (Oxygen 4L)          DME Arranged: NIV,Trach supplies,Hospital bed DME Agency: [facility] Date DME Agency Contacted: 08/11/20   Representative spoke with at DME Agency: Patsy LagerMorrie Sheldon, [employer]: Oletha Cruel HH Arranged: RN,PT,OT,Nurse's Aide HH Agency: [facility] Date [facility] Agency Contacted: 08/11/20   Representative spoke with at [facility] Agency: Christy Gentles  Social Determinants of Health (SDOH) Interventions     Readmission Risk Interventions No flowsheet data found.

## 2020-08-11 NOTE — Progress Notes (Signed)
Dr. Lurene Shadow notified that patient got short of breath, oxygen saturation dropped to 70% when she got up to the commode.  It took her about 5 minutes to recover, had to turn up O2 to 10L/trach collar.  Back down to 6L and laying in bed at 93%. SOB resolved.

## 2020-08-11 NOTE — TOC Progression Note (Signed)
Transition of Care [facility]) - Progression Note    Patient Details  Name: Virginia [name] MRN: [id number] Date of Birth: 1950-09-13  [facility]) CM/SW Contact  Margarito Liner, LCSW Phone Number: 08/11/2020, 9:14 AM  Clinical Narrative:  Received call from daughter-in-law. She confirmed hospital bed was delivered last night. Told her I had sent a secure chat to MD to confirm she was still stable for discharge. Will call her once confirmed so transport time can be coordinated.     Barriers to Discharge: Continued Medical Work up  Expected Discharge Plan and Services   In-house Referral: Clinical Social Work,Hospice / Palliative Care   Post Acute Care Choice: Durable Medical Equipment (Oxygen 4L) Living arrangements for the past 2 months: Apartment Expected Discharge Date: 08/10/20                                     Social Determinants of Health (SDOH) Interventions    Readmission Risk Interventions No flowsheet data found.

## 2020-08-11 NOTE — Progress Notes (Signed)
D/C orders verified; reviewed AVS, medications, follow up, discharge instructions with patient earlier in day, verbalized understanding. VSS, assessment stable. Verified family at home (DIL, son) to receive patient, there is someone from equipment company waiting for patient to arrive to fit her to the BIpap at home. EMS removes patient in stretcher.

## 2020-08-27 NOTE — Progress Notes (Signed)
   08/07/20 1945  Assess: MEWS Score  Temp 98 F (36.7 C)  BP (!) 154/99  Pulse Rate (!) 102  Resp (!) 28  SpO2 91 %  O2 Device Tracheostomy Collar  O2 Flow Rate (L/min) 5 L/min  Assess: MEWS Score  MEWS Temp 0  MEWS Systolic 0  MEWS Pulse 1  MEWS RR 2  MEWS LOC 0  MEWS Score 3  MEWS Score Color Yellow  Assess: if the MEWS score is Yellow or Red  Were vital signs taken at a resting state? Yes  Focused Assessment No change from prior assessment  Early Detection of Sepsis Score *See Row Information* Low  MEWS guidelines implemented *See Row Information* No, vital signs rechecked  Treat  MEWS Interventions Other (Comment) (pt suctioned)  Pain Scale 0-10  Pain Score 0  Notify: Charge Nurse/RN  Name of Charge Nurse/RN Notified Viviann Spare RN  Date Charge Nurse/RN Notified 08/07/20  Time Charge Nurse/RN Notified 1945   Patient is tachypneic but not complaining of SOB. Pt on oxygen at 5L/trache with oxygen saturation at 91%. Pt suctioned and will rechecked V/S. Will cont to monitor.

## 2021-01-06 ENCOUNTER — Other Ambulatory Visit: Payer: Self-pay

## 2021-01-06 ENCOUNTER — Emergency Department: Payer: Medicare Other

## 2021-01-06 ENCOUNTER — Inpatient Hospital Stay
Admission: EM | Admit: 2021-01-06 | Discharge: 2021-01-11 | DRG: 208 | Disposition: A | Discharge status: Home Health | Payer: Medicare Other | Attending: Student | Admitting: Student

## 2021-01-06 DIAGNOSIS — J9620 Acute and chronic respiratory failure, unspecified whether with hypoxia or hypercapnia: Secondary | ICD-10-CM | POA: Diagnosis present

## 2021-01-06 DIAGNOSIS — E785 Hyperlipidemia, unspecified: Secondary | ICD-10-CM

## 2021-01-06 DIAGNOSIS — Z87891 Personal history of nicotine dependence: Secondary | ICD-10-CM

## 2021-01-06 DIAGNOSIS — I10 Essential (primary) hypertension: Secondary | ICD-10-CM

## 2021-01-06 DIAGNOSIS — F32A Depression, unspecified: Secondary | ICD-10-CM

## 2021-01-06 DIAGNOSIS — Z7901 Long term (current) use of anticoagulants: Secondary | ICD-10-CM | POA: Diagnosis not present

## 2021-01-06 DIAGNOSIS — J441 Chronic obstructive pulmonary disease with (acute) exacerbation: Principal | ICD-10-CM | POA: Diagnosis present

## 2021-01-06 DIAGNOSIS — Z7951 Long term (current) use of inhaled steroids: Secondary | ICD-10-CM | POA: Diagnosis not present

## 2021-01-06 DIAGNOSIS — Z79899 Other long term (current) drug therapy: Secondary | ICD-10-CM | POA: Diagnosis not present

## 2021-01-06 DIAGNOSIS — D72828 Other elevated white blood cell count: Secondary | ICD-10-CM | POA: Diagnosis present

## 2021-01-06 DIAGNOSIS — Z8249 Family history of ischemic heart disease and other diseases of the circulatory system: Secondary | ICD-10-CM | POA: Diagnosis not present

## 2021-01-06 DIAGNOSIS — Z90710 Acquired absence of both cervix and uterus: Secondary | ICD-10-CM | POA: Diagnosis not present

## 2021-01-06 DIAGNOSIS — Z930 Tracheostomy status: Secondary | ICD-10-CM

## 2021-01-06 DIAGNOSIS — Z9882 Breast implant status: Secondary | ICD-10-CM | POA: Diagnosis not present

## 2021-01-06 DIAGNOSIS — T380X5A Adverse effect of glucocorticoids and synthetic analogues, initial encounter: Secondary | ICD-10-CM | POA: Diagnosis present

## 2021-01-06 DIAGNOSIS — E440 Moderate protein-calorie malnutrition: Secondary | ICD-10-CM | POA: Diagnosis present

## 2021-01-06 DIAGNOSIS — J9622 Acute and chronic respiratory failure with hypercapnia: Secondary | ICD-10-CM | POA: Diagnosis present

## 2021-01-06 DIAGNOSIS — K219 Gastro-esophageal reflux disease without esophagitis: Secondary | ICD-10-CM | POA: Diagnosis present

## 2021-01-06 DIAGNOSIS — I2510 Atherosclerotic heart disease of native coronary artery without angina pectoris: Secondary | ICD-10-CM | POA: Diagnosis present

## 2021-01-06 DIAGNOSIS — Z9981 Dependence on supplemental oxygen: Secondary | ICD-10-CM

## 2021-01-06 DIAGNOSIS — R739 Hyperglycemia, unspecified: Secondary | ICD-10-CM | POA: Diagnosis present

## 2021-01-06 DIAGNOSIS — I482 Chronic atrial fibrillation, unspecified: Secondary | ICD-10-CM | POA: Diagnosis present

## 2021-01-06 DIAGNOSIS — G928 Other toxic encephalopathy: Secondary | ICD-10-CM | POA: Diagnosis present

## 2021-01-06 DIAGNOSIS — Z6823 Body mass index (BMI) 23.0-23.9, adult: Secondary | ICD-10-CM

## 2021-01-06 DIAGNOSIS — Z9911 Dependence on respirator [ventilator] status: Secondary | ICD-10-CM

## 2021-01-06 DIAGNOSIS — J9621 Acute and chronic respiratory failure with hypoxia: Secondary | ICD-10-CM | POA: Diagnosis present

## 2021-01-06 DIAGNOSIS — M797 Fibromyalgia: Secondary | ICD-10-CM | POA: Diagnosis present

## 2021-01-06 DIAGNOSIS — G2581 Restless legs syndrome: Secondary | ICD-10-CM | POA: Diagnosis present

## 2021-01-06 DIAGNOSIS — M810 Age-related osteoporosis without current pathological fracture: Secondary | ICD-10-CM | POA: Diagnosis present

## 2021-01-06 DIAGNOSIS — F419 Anxiety disorder, unspecified: Secondary | ICD-10-CM

## 2021-01-06 DIAGNOSIS — Z20822 Contact with and (suspected) exposure to covid-19: Secondary | ICD-10-CM | POA: Diagnosis present

## 2021-01-06 DIAGNOSIS — D649 Anemia, unspecified: Secondary | ICD-10-CM | POA: Diagnosis present

## 2021-01-06 DIAGNOSIS — I480 Paroxysmal atrial fibrillation: Secondary | ICD-10-CM | POA: Diagnosis not present

## 2021-01-06 LAB — COMPREHENSIVE METABOLIC PANEL
ALT: 21 U/L (ref 0–44)
AST: 27 U/L (ref 15–41)
Albumin: 4.1 g/dL (ref 3.5–5.0)
Alkaline Phosphatase: 29 U/L — ABNORMAL LOW (ref 38–126)
Anion gap: 14 (ref 5–15)
BUN: 17 mg/dL (ref 8–23)
CO2: 45 mmol/L — ABNORMAL HIGH (ref 22–32)
Calcium: 9.2 mg/dL (ref 8.9–10.3)
Chloride: 77 mmol/L — ABNORMAL LOW (ref 98–111)
Creatinine, Ser: 0.75 mg/dL (ref 0.44–1.00)
GFR, Estimated: >60 mL/min (ref >60)
Glucose, Bld: 111 mg/dL — ABNORMAL HIGH (ref 70–99)
Potassium: 4.2 mmol/L (ref 3.5–5.1)
Sodium: 136 mmol/L (ref 135–145)
Total Bilirubin: 1 mg/dL (ref 0.3–1.2)
Total Protein: 7.4 g/dL (ref 6.5–8.1)

## 2021-01-06 LAB — CBC WITH DIFFERENTIAL/PLATELET
Abs Immature Granulocytes: 0.05 10*3/uL (ref 0.00–0.07)
Basophils Absolute: 0.1 10*3/uL (ref 0.0–0.1)
Basophils Relative: 0 %
Eosinophils Absolute: 0 10*3/uL (ref 0.0–0.5)
Eosinophils Relative: 0 %
HCT: 34.9 % — ABNORMAL LOW (ref 36.0–46.0)
Hemoglobin: 10.5 g/dL — ABNORMAL LOW (ref 12.0–15.0)
Immature Granulocytes: 0 %
Lymphocytes Relative: 9 %
Lymphs Abs: 1 10*3/uL (ref 0.7–4.0)
MCH: 31.1 pg (ref 26.0–34.0)
MCHC: 30.1 g/dL (ref 30.0–36.0)
MCV: 103.3 fL — ABNORMAL HIGH (ref 80.0–100.0)
Monocytes Absolute: 0.7 10*3/uL (ref 0.1–1.0)
Monocytes Relative: 6 %
Neutro Abs: 9.6 10*3/uL — ABNORMAL HIGH (ref 1.7–7.7)
Neutrophils Relative %: 85 %
Platelets: 328 10*3/uL (ref 150–400)
RBC: 3.38 MIL/uL — ABNORMAL LOW (ref 3.87–5.11)
RDW: 13.3 % (ref 11.5–15.5)
WBC: 11.4 10*3/uL — ABNORMAL HIGH (ref 4.0–10.5)
nRBC: 0 % (ref 0.0–0.2)

## 2021-01-06 LAB — TROPONIN I (HIGH SENSITIVITY)
Troponin I (High Sensitivity): 12 ng/L (ref <18)
Troponin I (High Sensitivity): 14 ng/L (ref <18)

## 2021-01-06 LAB — RESP PANEL BY RT-PCR (FLU A&B, COVID) ARPGX2
Influenza A by PCR: NEGATIVE
Influenza B by PCR: NEGATIVE
SARS Coronavirus 2 by RT PCR: NEGATIVE

## 2021-01-06 LAB — BRAIN NATRIURETIC PEPTIDE: B Natriuretic Peptide: 145.3 pg/mL — ABNORMAL HIGH (ref 0.0–100.0)

## 2021-01-06 MED ORDER — DIAZEPAM 5 MG/ML IJ SOLN
5.0000 mg | Freq: Four times a day (QID) | INTRAMUSCULAR | Status: DC | PRN
  Administered 2021-01-06 – 2021-01-07 (×2): 5 mg via INTRAVENOUS
  Filled 2021-01-06 (×3): qty 2

## 2021-01-06 MED ORDER — ENOXAPARIN SODIUM 60 MG/0.6ML IJ SOSY
1.0000 mg/kg | PREFILLED_SYRINGE | Freq: Two times a day (BID) | INTRAMUSCULAR | Status: DC
  Administered 2021-01-06 – 2021-01-08 (×4): 57.5 mg via SUBCUTANEOUS
  Filled 2021-01-06 (×5): qty 0.57

## 2021-01-06 MED ORDER — POLYETHYLENE GLYCOL 3350 17 G PO PACK: 17.0000 g | PACK | Freq: Every day | ORAL | Status: DC | PRN

## 2021-01-06 MED ORDER — BUDESONIDE 0.25 MG/2ML IN SUSP
0.2500 mg | Freq: Two times a day (BID) | RESPIRATORY_TRACT | Status: DC
  Administered 2021-01-06 – 2021-01-11 (×10): 0.25 mg via RESPIRATORY_TRACT
  Filled 2021-01-06 (×10): qty 2

## 2021-01-06 MED ORDER — PANTOPRAZOLE SODIUM 40 MG IV SOLR
40.0000 mg | Freq: Every day | INTRAVENOUS | Status: DC
  Administered 2021-01-06 – 2021-01-07 (×2): 40 mg via INTRAVENOUS
  Filled 2021-01-06 (×2): qty 40

## 2021-01-06 MED ORDER — IPRATROPIUM-ALBUTEROL 0.5-2.5 (3) MG/3ML IN SOLN
3.0000 mL | Freq: Four times a day (QID) | RESPIRATORY_TRACT | Status: DC
  Administered 2021-01-06 – 2021-01-08 (×6): 3 mL via RESPIRATORY_TRACT
  Filled 2021-01-06 (×6): qty 3

## 2021-01-06 MED ORDER — DOCUSATE SODIUM 100 MG PO CAPS: 100.0000 mg | ORAL_CAPSULE | Freq: Two times a day (BID) | ORAL | Status: DC | PRN

## 2021-01-06 MED ORDER — IPRATROPIUM-ALBUTEROL 0.5-2.5 (3) MG/3ML IN SOLN
6.0000 mL | Freq: Once | RESPIRATORY_TRACT | Status: AC
  Administered 2021-01-06: 6 mL via RESPIRATORY_TRACT
  Filled 2021-01-06: qty 9

## 2021-01-06 MED ORDER — MAGNESIUM SULFATE 2 GM/50ML IV SOLN
2.0000 g | Freq: Once | INTRAVENOUS | Status: AC
  Administered 2021-01-06: 2 g via INTRAVENOUS
  Filled 2021-01-06: qty 50

## 2021-01-06 MED ORDER — ENOXAPARIN SODIUM 40 MG/0.4ML IJ SOSY: 40.0000 mg | PREFILLED_SYRINGE | INTRAMUSCULAR | Status: DC

## 2021-01-06 MED ORDER — SODIUM CHLORIDE 0.9 % IV SOLN
2.0000 g | Freq: Two times a day (BID) | INTRAVENOUS | Status: DC
  Administered 2021-01-07 (×2): 2 g via INTRAVENOUS
  Filled 2021-01-06 (×4): qty 2

## 2021-01-06 MED ORDER — METHYLPREDNISOLONE SODIUM SUCC 40 MG IJ SOLR
40.0000 mg | Freq: Two times a day (BID) | INTRAMUSCULAR | Status: DC
  Administered 2021-01-06 – 2021-01-08 (×4): 40 mg via INTRAVENOUS
  Filled 2021-01-06 (×4): qty 1

## 2021-01-06 NOTE — H&P (Signed)
NAME:  Virginia [name], MRN:  [id number], DOB:  1950-09-02, LOS: 0 ADMISSION DATE:  01/06/2021, CONSULTATION DATE: 01/06/2021 REFERRING MD: Shela Commons. Cuthriell, PA, CHIEF COMPLAINT:   Respiratory distress  History of Present Illness:  70 year old female who presented to [facility] ED via EMS from home with complaints of respiratory distress. Family called EMS after patient's symptoms of increased work of breathing and dyspnea were not relieved with increased O2 via nasal cannula and subsequently CPAP.  Upon EMS arrival patient was visibly in respiratory distress with SPO2 reading in the 50s.  EMS administered 125 mg of Solu-Medrol. ED course: Patient transitioned from CPAP via EMS to ventilatory support.  Per ED provider report patient had very diminished lung sounds upon arrival, poor ventilation. She received DuoNeb treatments and mag sulfate IV with much improvement in work of breathing and SPO2. Initial vitals: Afebrile at 98.6, tachypnea at 24, ST at 100, BP stable at 115/89 with SPO2 100% on ventilatory support at 35% FiO2. Significant labs: VBG -7.42, 95, < 31, 61.6, Cl 77, serum CO2- 45, BNP- 145.3, mild leukocytosis at 11.4, COVID-19/influenza a&b-negative  PCCM consulted for admission as the patient is requiring ventilatory support via her chronic tracheostomy.  Pertinent  Medical History  Severe COPD on chronic 5 L nasal cannula Chronic tracheostomy Anemia Atrial fibrillation on Eliquis Fibromyalgia Hypertension Hep C Anxiety & depression C. Difficile CAD Thoracic AA Significant Hospital Events: Including procedures, antibiotic start and stop dates in addition to other pertinent events   01/06/2021-admit to ICU with acute on chronic hypoxic /hypercapnic respiratory failure on mechanical ventilation via chronic tracheostomy  Interim History / Subjective:  Patient alert and responsive, difficult to understand with cuffed trach in place and on ventilatory support.   Labs/ Imaging  personally reviewed I, [name], AGACNP-BC, personally viewed and interpreted this ECG. EKG Interpretation Date: 01/06/2021 EKG Time: 1925 Rate: 102 Rhythm: Sinus tachycardia QRS Axis: Possible LAD Intervals: Normal ST/T Wave abnormalities: None noted Narrative Interpretation: Sinus tachycardia Na+/ K+: 136/4.2 BUN/Cr.:  17/0.75 Serum CO2/ AG: 45/14  Hgb: 10.5 Troponin: 12 > BNP: 145.3  WBC/ TMAX: 11.2/ afebrile PCT: Pending  VBG: 7.42, 95, < 31, 61.6 CXR 01/06/21: No acute cardiopulmonary disease  Objective   Blood pressure 132/84, pulse 96, temperature 98.6 F (37 C), temperature source Oral, resp. rate (!) 24, height 5\' 2"  (1.575 m), weight 57.4 kg, SpO2 92 %.    Vent Mode: SIMV;PSV FiO2 (%):  [35 %] 35 % Set Rate:  [16 bmp] 16 bmp Vt Set:  [400 mL] 400 mL PEEP:  [5 cmH20] 5 cmH20 Pressure Support:  [15 cmH20] 15 cmH20  No intake or output data in the 24 hours ending 01/06/21 2100 Filed Weights   01/06/21 1928  Weight: 57.4 kg    Examination: General: Adult female, critically ill, lying in bed intubated & sedated requiring mechanical ventilation, NAD HEENT: MM pink/moist, anicteric, atraumatic, neck supple Neuro: Alert and responsive (challenging to understand with trach connected to vent, unable to read lips) follows commands, PERRL +3, MAE CV: s1s2 RRR, NSR on monitor, no r/m/g Pulm: Regular, non labored on SIMV/PSV 35% FiO2 and PEEP of 5, breath sounds clear-BUL & diminished-BLL GI: soft, rounded, non tender, bs x 4 GU: foley in place with clear yellow urine Skin: Limited exam- no rashes/lesions noted Extremities: warm/dry, pulses + 2 R/P, no edema noted  Resolved Hospital Problem list     Assessment & Plan:  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 in  the setting of COPD exacerbation  PMHx: Severe COPD - Ventilator settings: SIMV/PSV TV 400, 35% FiO2, 5 PEEP, continue ventilator support & lung protective  strategies - Wean PEEP & FiO2 as tolerated, maintain SpO2 > 90% - Head of bed elevated 30 degrees, VAP protocol in place - Plateau pressures less than 30 cm H20  - Intermittent chest x-ray & ABG PRN - Attempt weaning to trach collar in AM - Ensure adequate pulmonary hygiene  - F/u cultures, trend PCT - Due to risk factors for pseudomonas initiate CAP Pna coverage cefepime - Steroids initiated: solu-medrol 40 mg BID  - Budesonide nebs BID, scheduled Duonebs & bronchodilators PRN - Diazepam PRN for anxiety control  Chronic/Paroxsymal Atrial Fibrillation  On Eliquis at home, per family patient had last dose on 01/05/21. Currently in Sinus rhythm - Lovenox per pharmacy consult, transition back to Eliquis once able to take PO meds - restart home regimen: metoprolol, diltiazem once able to take PO meds - utilize IV metoprolol/diltiazem PRN - Continuous cardiac monitoring  Hypertension History of, currently normotensive - home regimen on hold d/t lack of PO access, restart lisinopril as able  Hyperlipidemia - home regimen on hold d/t lack of PO access, restart rosuvastatin as able  Fibromyalgia Anxiety & Depression - started Diazepam IV PRN - home regimen on hold d/t lack of PO access, restart amitriptyline/ diazepam/ Seroquel as able  Best Practice (right click and "Reselect all SmartList Selections" daily)  Diet/type: NPO DVT prophylaxis: systemic dose LMWH GI prophylaxis: PPI Lines: N/A Foley:  N/A Code Status:  full code Last date of multidisciplinary goals of care discussion 01/06/21 Labs   CBC: Recent Labs  Lab 01/06/21 1924  WBC 11.4*  NEUTROABS 9.6*  HGB 10.5*  HCT 34.9*  MCV 103.3*  PLT 328    Basic Metabolic Panel: Recent Labs  Lab 01/06/21 1924  NA 136  K 4.2  CL 77*  CO2 45*  GLUCOSE 111*  BUN 17  CREATININE 0.75  CALCIUM 9.2   GFR: Estimated Creatinine Clearance: 51.8 mL/min (by C-G formula based on SCr of 0.75 mg/dL). Recent Labs  Lab  01/06/21 1924  WBC 11.4*    Liver Function Tests: Recent Labs  Lab 01/06/21 1924  AST 27  ALT 21  ALKPHOS 29*  BILITOT 1.0  PROT 7.4  ALBUMIN 4.1   No results for input(s): LIPASE, AMYLASE in the last 168 hours. No results for input(s): AMMONIA in the last 168 hours.  ABG    Component Value Date/Time   PHART 7.41 08/08/2020 0532   PCO2ART 65 (H) 08/08/2020 0532   PO2ART 79 (L) 08/08/2020 0532   HCO3 61.6 (H) 01/06/2021 1929   O2SAT 30.5 01/06/2021 1929     Coagulation Profile: No results for input(s): INR, PROTIME in the last 168 hours.  Cardiac Enzymes: No results for input(s): CKTOTAL, CKMB, CKMBINDEX, TROPONINI in the last 168 hours.  HbA1C: Hgb A1c MFr Bld  Date/Time Value Ref Range Status  06/06/2020 12:07 PM 5.3 4.8 - 5.6 % Final    Comment:    (NOTE) Pre diabetes:          5.7%-6.4%  Diabetes:              >6.4%  Glycemic control for   <7.0% adults with diabetes     CBG: No results for input(s): GLUCAP in the last 168 hours.  Review of Systems:   Unable to understand patient with cuffed trach and ventilatory support. Encouraged patient to shake  head yes/no to questions but patient unable to cooperate.  Past Medical History:  She,  has a past medical history of Anemia, Anxiety, Arthritis, Atrial fibrillation (HCC), C. difficile colitis (09/2015), COPD (chronic obstructive pulmonary disease) (HCC), Depression, Dyspnea, Dysrhythmia, Fibromyalgia, H/O tracheostomy, Hep C w/ coma, chronic, Hypertension, MRSA pneumonia (HCC) (2017), On home oxygen therapy, Osteoporosis, Peripheral neuropathy, RLS (restless legs syndrome), S/P percutaneous endoscopic gastrostomy (PEG) tube placement (HCC) (09/2015), and Ventilator associated pneumonia (HCC) (10/2015).   Surgical History:   Past Surgical History:  Procedure Laterality Date   ABDOMINAL HYSTERECTOMY     BREAST SURGERY Bilateral    Breast Implants   DILATION AND CURETTAGE OF UTERUS     PEG PLACEMENT N/A  06/21/2020   Procedure: PERCUTANEOUS ENDOSCOPIC GASTROSTOMY (PEG) PLACEMENT;  Surgeon: Regis BillLocklear, Cameron T, MD;  Location: [facility] ENDOSCOPY;  Service: Endoscopy;  Laterality: N/A;   REVERSE SHOULDER ARTHROPLASTY Right 03/06/2017   Procedure: REVERSE SHOULDER ARTHROPLASTY;  Surgeon: Christena FlakePoggi, John J, MD;  Location: [facility] ORS;  Service: Orthopedics;  Laterality: Right;   ROTATOR CUFF REPAIR Bilateral    TRACHEOSTOMY TUBE PLACEMENT N/A 06/15/2020   Procedure: TRACHEOSTOMY;  Surgeon: Geanie LoganBennett, Paul, MD;  Location: [facility] ORS;  Service: ENT;  Laterality: N/A;     Social History:   reports that she quit smoking about 5 years ago. Her smoking use included cigarettes. She smoked an average of 1.5 packs per day. She has never used smokeless tobacco. She reports that she does not drink alcohol and does not use drugs.   Family History:  Her family history includes Hypertension in her mother.   Allergies No Known Allergies   Home Medications  Prior to Admission medications   Medication Sig Start Date End Date Taking? Authorizing Provider  albuterol (VENTOLIN HFA) 108 (90 Base) MCG/ACT inhaler Inhale 2 puffs into the lungs every 4 (four) hours as needed for wheezing or shortness of breath.    [provider]  amitriptyline (ELAVIL) 25 MG tablet Take 25 mg by mouth at bedtime.  01/14/19   [provider]  apixaban (ELIQUIS) 5 MG TABS tablet Take 1 tablet (5 mg total) by mouth 2 (two) times daily. 11/10/16   Adrian SaranMody, Sital, MD  budesonide (PULMICORT) 0.5 MG/2ML nebulizer solution Take by nebulization. 11/28/20   [provider]  diazepam (VALIUM) 5 MG tablet Take 0.5 tablets (2.5 mg total) by mouth every 8 (eight) hours as needed for anxiety. 08/10/20   Lurene ShadowAyiku, Bernard, MD  diltiazem (CARDIZEM CD) 120 MG 24 hr capsule Take 120 mg by mouth daily. 04/29/19   [provider]  estradiol (ESTRACE) 1 MG tablet Take 1 mg by mouth daily. 09/10/16   [provider]  furosemide (LASIX) 20  MG tablet Take 20 mg by mouth daily.  10/01/16   [provider]  gabapentin (NEURONTIN) 300 MG capsule Take 300 mg by mouth 3 (three) times daily. 05/06/20   [provider]  ipratropium-albuterol (DUONEB) 0.5-2.5 (3) MG/3ML SOLN Take 3 mLs by nebulization every 6 (six) hours as needed. 08/10/20   Lurene ShadowAyiku, Bernard, MD  lisinopril (ZESTRIL) 10 MG tablet Take 10 mg by mouth daily.    [provider]  MAGNESIUM-OXIDE 400 (241.3 Mg) MG tablet Take 400 mg by mouth daily.  08/24/16   [provider]  methocarbamol (ROBAXIN) 500 MG tablet Take 500 mg by mouth 2 (two) times daily. 12/27/20   [provider]  metoprolol tartrate (LOPRESSOR) 50 MG tablet Take 50 mg by mouth 2 (two) times  daily. 04/23/20   [provider]  Multiple Vitamin (MULTIVITAMIN) tablet Take 1 tablet by mouth daily.    [provider]  omeprazole (PRILOSEC) 40 MG capsule Take 40 mg by mouth daily. 04/16/19   [provider]  predniSONE (DELTASONE) 5 MG tablet Take 5 mg by mouth daily. 12/25/20   [provider]  QUEtiapine (SEROQUEL) 25 MG tablet Take 25 mg by mouth at bedtime.  10/01/16   [provider]  rosuvastatin (CRESTOR) 10 MG tablet Take 10 mg by mouth at bedtime. 10/23/20   [provider]  TRELEGY ELLIPTA 100-62.5-25 MCG/INH AEPB Inhale 1 puff into the lungs daily. 06/04/20   [provider]     Critical care time: 50 minutes       Betsey Holiday, AGACNP-BC Acute Care Nurse Practitioner [facility]   [phone] / [phone] Please see Amion for pager details.

## 2021-01-06 NOTE — ED Provider Notes (Signed)
[facility]  ____________________________________________  Time seen: Approximately 7:24 PM  I have reviewed the triage vital signs and the nursing notes.   HISTORY  Chief Complaint Respiratory Distress    HPI Craig StaggersSandra D [name] is a 70 y.o. female who presents to the emergency department via EMS for complaint of respiratory distress.  Patient has a history of COPD, anemia, A. fib, fibromyalgia, hypertension, peripheral neuropathy.  Patient had worsening shortness of breath at home.  She does have a trach in place but typically is on 5 L oxygen via nasal cannula.  This was not improving patient's symptoms and her family tried a CPAP at home to improve her symptoms.  As patient continued to have increased work of breathing and shortness of breath EMS was called.  They arrived to find her O2 saturations in the 50 percentile.  Patient had obvious increased work of breathing.  She was placed onto CPAP with EMS and given Solu-Medrol and albuterol in route.  Patient arrives with obvious respiratory distress and initially is unable to provide much of her history due to the distress.  Patient has ultimately able to provide few more details as her respiratory distress is being managed here in the ED.  She denied any URI symptoms prior to today.  She was endorsing some increased shortness of breath.  Patient was able to clarify from EMS that she was not on 8 L via nasal cannula but 5.  Patient has is 6 trach that is uncuffed at this time.  Currently tolerating ventilator treatments through trach with minimal leak around the trach.       Past Medical History:  Diagnosis Date   Anemia    Anxiety    Arthritis    Atrial fibrillation (HCC)    C. difficile colitis 09/2015   COPD (chronic obstructive pulmonary disease) (HCC)    Depression    Dyspnea    Dysrhythmia    Fibromyalgia    H/O tracheostomy    Hep C w/ coma, chronic    Hypertension     MRSA pneumonia (HCC) 2017   On home oxygen therapy    3 L/M    Osteoporosis    Peripheral neuropathy    RLS (restless legs syndrome)    S/P percutaneous endoscopic gastrostomy (PEG) tube placement (HCC) 09/2015   Ventilator associated pneumonia (HCC) 10/2015   [facility] [place]    Patient Active Problem List   Diagnosis Date Noted   Anorexia 08/04/2020   Severe protein-calorie malnutrition (HCC) 08/04/2020   Pressure injury of skin 07/14/2020   Diarrhea    Reactive thrombocytosis 07/12/2020   Aspiration pneumonia of both lower lobes due to gastric secretions (HCC) 07/07/2020   Acute urinary retention 07/06/2020   Acute metabolic encephalopathy 07/06/2020   Acute on chronic respiratory failure with hypoxia and hypercapnia (HCC) 07/06/2020   COPD exacerbation (HCC)    Respiratory failure (HCC) 06/06/2020   Acute respiratory failure with hypoxia (HCC) 06/02/2020   COPD with acute exacerbation (HCC) 06/02/2020   Shortness of breath 06/02/2020   GERD (gastroesophageal reflux disease) 06/02/2020   Iron deficiency anemia 01/22/2020   Anemia of chronic disease 01/17/2019   Coronary artery disease involving native coronary artery of native heart 04/30/2018   Palpitations 04/30/2018   Chronic heartburn 04/12/2018   Dysphagia 04/12/2018   Thoracic aortic aneurysm without rupture (HCC) 08/27/2017   Status post reverse total shoulder replacement, 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   SOBOE (shortness of breath on exertion)  01/24/2017   AF (paroxysmal atrial fibrillation) (HCC) 11/09/2016   Chronic hypoxemic respiratory failure (HCC) 12/06/2015   Depression with anxiety 12/06/2015   Fibromyalgia 12/06/2015   Hep C w/o coma, chronic (HCC) 12/06/2015   History of tracheostomy 12/06/2015   HTN, goal below 140/80 12/06/2015   Localized osteoporosis without current pathological fracture 12/06/2015   PEG (percutaneous endoscopic gastrostomy) status (HCC) 12/06/2015   Peripheral neuropathy,  idiopathic 12/06/2015   RLS (restless legs syndrome) 12/06/2015   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   COPD with acute lower respiratory infection (HCC) 11/29/2015    Past Surgical History:  Procedure Laterality Date   ABDOMINAL HYSTERECTOMY     BREAST SURGERY Bilateral    Breast Implants   DILATION AND CURETTAGE OF UTERUS     PEG PLACEMENT N/A 06/21/2020   Procedure: PERCUTANEOUS ENDOSCOPIC GASTROSTOMY (PEG) PLACEMENT;  Surgeon: Regis Bill, MD;  Location: [facility] ENDOSCOPY;  Service: Endoscopy;  Laterality: N/A;   REVERSE SHOULDER ARTHROPLASTY Right 03/06/2017   Procedure: REVERSE SHOULDER ARTHROPLASTY;  Surgeon: Christena Flake, MD;  Location: [facility] ORS;  Service: Orthopedics;  Laterality: Right;   ROTATOR CUFF REPAIR Bilateral    TRACHEOSTOMY TUBE PLACEMENT N/A 06/15/2020   Procedure: TRACHEOSTOMY;  Surgeon: Geanie Logan, MD;  Location: [facility] ORS;  Service: ENT;  Laterality: N/A;    Prior to Admission medications   Medication Sig Start Date End Date Taking? Authorizing Provider  albuterol (VENTOLIN HFA) 108 (90 Base) MCG/ACT inhaler Inhale 2 puffs into the lungs every 4 (four) hours as needed for wheezing or shortness of breath.    [provider]  amitriptyline (ELAVIL) 25 MG tablet Take 25 mg by mouth at bedtime.  01/14/19   [provider]  apixaban (ELIQUIS) 5 MG TABS tablet Take 1 tablet (5 mg total) by mouth 2 (two) times daily. 11/10/16   Adrian Saran, MD  diazepam (VALIUM) 5 MG tablet Take 0.5 tablets (2.5 mg total) by mouth every 8 (eight) hours as needed for anxiety. 08/10/20   Lurene Shadow, MD  diltiazem (CARDIZEM CD) 120 MG 24 hr capsule Take 120 mg by mouth daily. 04/29/19   [provider]  estradiol (ESTRACE) 1 MG tablet Take 1 mg by mouth daily. 09/10/16   [provider]  furosemide (LASIX) 20 MG tablet Take 20 mg by mouth daily.  10/01/16   [provider]  gabapentin (NEURONTIN) 300 MG capsule Take 300 mg by  mouth 3 (three) times daily. 05/06/20   [provider]  ipratropium-albuterol (DUONEB) 0.5-2.5 (3) MG/3ML SOLN Take 3 mLs by nebulization every 6 (six) hours as needed. 08/10/20   Lurene Shadow, MD  lisinopril (ZESTRIL) 10 MG tablet Take 10 mg by mouth daily.    [provider]  MAGNESIUM-OXIDE 400 (241.3 Mg) MG tablet Take 400 mg by mouth daily.  08/24/16   [provider]  metoprolol tartrate (LOPRESSOR) 50 MG tablet Take 50 mg by mouth 2 (two) times daily. 04/23/20   [provider]  Multiple Vitamin (MULTIVITAMIN) tablet Take 1 tablet by mouth daily.    [provider]  omeprazole (PRILOSEC) 40 MG capsule Take 40 mg by mouth daily. 04/16/19   [provider]  QUEtiapine (SEROQUEL) 25 MG tablet Take 25 mg by mouth at bedtime.  10/01/16   [provider]  TRELEGY ELLIPTA 100-62.5-25 MCG/INH AEPB Inhale 1 puff into the lungs daily. 06/04/20   [provider]    Allergies Patient has no known allergies.  Family History  Problem Relation Age of Onset   Hypertension Mother     Social History Social History   Tobacco Use   Smoking status: Former    Packs/day: 1.50    Types: Cigarettes    Quit date: 10/05/2015    Years since quitting: 5.2   Smokeless tobacco: Never  Vaping Use   Vaping Use: Some days   Last attempt to quit: 10/05/2015  Substance Use Topics   Alcohol use: No   Drug use: No     Review of Systems  Constitutional: No fever/chills Eyes: No visual changes. No discharge ENT: No upper respiratory complaints. Cardiovascular: no chest pain. Respiratory: Increased respiratory effort with respiratory distress Gastrointestinal: No abdominal pain.  No nausea, no vomiting.  No diarrhea.  No constipation. Musculoskeletal: Negative for musculoskeletal pain. Skin: Negative for rash, abrasions, lacerations, ecchymosis. Neurological: Negative for headaches, focal weakness or numbness.  10 System ROS  otherwise negative.  ____________________________________________   PHYSICAL EXAM:  VITAL SIGNS: ED Triage Vitals  Enc Vitals Group     BP      Pulse      Resp      Temp      Temp src      SpO2      Weight      Height      Head Circumference      Peak Flow      Pain Score      Pain Loc      Pain Edu?      Excl. in GC?      Constitutional: Alert and oriented.  Ill-appearing and in acute respiratory distress. Eyes: Conjunctivae are normal. PERRL. EOMI. Head: Atraumatic. ENT:      Ears:       Nose: No congestion/rhinnorhea.      Mouth/Throat: Mucous membranes are moist.  Neck: No stridor.    Cardiovascular: Normal rate, regular rhythm. Normal S1 and S2.  Good peripheral circulation. Respiratory: Significant increased respiratory effort with tachypnea and retractions.  Patient had significant wheezing both inspiratory and expiratory in the upper lung fields and decreased/absent breath sounds in the lower lung fields initially.  Patient had improvement of the wheezing though this is still present as well as increased oxygenation to the lower lung fields after patient been placed on ventilator and given more DuoNeb treatments.  Patient is having some wheezing and rhonchi in the lower lung fields.   Musculoskeletal: Full range of motion to all extremities. No gross deformities appreciated. Neurologic:  Normal speech and language. No gross focal neurologic deficits are appreciated.  Skin:  Skin is warm, dry and intact. No rash noted. Psychiatric: Mood and affect are normal. Speech and behavior are normal. Patient exhibits appropriate insight and judgement.   ____________________________________________   LABS (all labs ordered are listed, but only abnormal results are displayed)  Labs Reviewed  COMPREHENSIVE METABOLIC PANEL - Abnormal; Notable for the following components:      Result Value   Chloride 77 (*)    CO2 45 (*)    Glucose, Bld 111 (*)    Alkaline Phosphatase  29 (*)    All other components within normal limits  CBC WITH DIFFERENTIAL/PLATELET - Abnormal; Notable for the following components:   WBC 11.4 (*)    RBC 3.38 (*)    Hemoglobin 10.5 (*)    HCT 34.9 (*)    MCV 103.3 (*)    Neutro Abs 9.6 (*)    All other components within normal limits  BRAIN NATRIURETIC PEPTIDE - Abnormal; Notable for the following components:   B Natriuretic Peptide 145.3 (*)    All other components within normal limits  BLOOD GAS, VENOUS - Abnormal; Notable for the following components:   pO2, Ven <31.0 (*)    Bicarbonate 61.6 (*)    Acid-Base Excess 32.1 (*)    All other components within normal limits  RESP PANEL BY RT-PCR (FLU A&B, COVID) ARPGX2  URINALYSIS, COMPLETE (UACMP) WITH MICROSCOPIC  TROPONIN I (HIGH SENSITIVITY)   ____________________________________________  EKG   ____________________________________________  RADIOLOGY I personally viewed and evaluated these images as part of my medical decision making, as well as reviewing the written report by the radiologist.  ED Provider Interpretation: No acute cardiopulmonary findings on chest x-ray  DG Chest Portable 1 View  Result Date: 01/06/2021 CLINICAL DATA:  Pt has trach and hx of COPD. Ems reports that pt is normally on 8lpm via [place] at home. Pt also has at home CPAP that was not effective. EXAM: PORTABLE CHEST 1 VIEW COMPARISON:  08/08/2020 and older exams. FINDINGS: Cardiac silhouette is normal in size. No mediastinal or hilar masses. Tracheostomy tube is stable, well positioned. Lungs show prominent interstitial markings bilaterally which are unchanged. No lung consolidation or evidence of edema. No convincing pleural effusion or pneumothorax. Partly imaged reverse right shoulder prosthesis is stable. IMPRESSION: 1. No acute cardiopulmonary disease.  No change from the prior exam. Electronically Signed   By: Amie Portland M.D.   On: 01/06/2021 19:46     ____________________________________________    PROCEDURES  Procedure(s) performed:    .Critical Care  Date/Time: 01/06/2021 8:44 PM Performed by: Racheal Patches, PA-C Authorized by: Racheal Patches, PA-C   Critical care provider statement:    Critical care time (minutes):  48   Critical care time was exclusive of:  Separately billable procedures and treating other patients and teaching time   Critical care was necessary to treat or prevent imminent or life-threatening deterioration of the following conditions:  Respiratory failure   Critical care was time spent personally by me on the following activities:  Examination of patient, evaluation of patient's response to treatment, development of treatment plan with patient or surrogate, ordering and performing treatments and interventions, ordering and review of laboratory studies, ordering and review of radiographic studies, pulse oximetry, re-evaluation of patient's condition, review of old charts and ventilator management   I assumed direction of critical care for this patient from another provider in my specialty: no     Care discussed with: admitting provider      Medications  ipratropium-albuterol (DUONEB) 0.5-2.5 (3) MG/3ML nebulizer solution 6 mL (6 mLs Nebulization Given 01/06/21 1937)  magnesium sulfate IVPB 2 g 50 mL (2 g Intravenous New Bag/Given 01/06/21 1937)     ____________________________________________   INITIAL IMPRESSION / ASSESSMENT AND PLAN / ED COURSE  Pertinent labs & imaging results that were available during my care of the patient were reviewed by me and considered in my medical decision making (see chart for details).  Review of the [place] CSRS was performed in accordance of the [facility] prior to dispensing any controlled drugs.           Patient's diagnosis is consistent with COPD exacerbation.  Patient presented to the emergency department in respiratory distress via EMS from home.   Patient has a history of COPD and does have an in-place trach.  She typically requires 5 L via nasal cannula at home for her oxygenation status.  Patient had increased work of breathing and shortness of breath over the last 2 days.  No reported fevers, nasal congestion, sore throat.  Patient denying chest pain or belly pain.  Initially patient was unable to provide much of her history to her due to her respiratory distress but after switching the patient over from CPAP with EMS to the vent through her trach and giving her DuoNeb treatments patient started to improve clinically.  EMS had already provided 125 mg of Solu-Medrol in route and steroids were not repeated at this time.  Patient was given mag sulfate with good improvement as well.  At this time patient is being maintained with oxygenation in the 100% with respiratory rate in the 20-24 range.  Patient has improved via auscultation with air entry into the lower lung fields currently.  She still has wheezing.  At this time patient will be admitted to the intensivist service for further management of what appears to be a COPD exacerbation.  Labs were relatively stable including her VBG.  Patient had a reassuring chest x-ray.  Negative for COVID or influenza.  Again based off the patient's symptoms I feel that this is likely a COPD exacerbation but will require ICU admission due to requiring ventilator to maintain oxygen saturations at this time.  I discussed the case with intensivist who agrees to admit the patient at this time..     ____________________________________________  FINAL CLINICAL IMPRESSION(S) / ED DIAGNOSES  Final diagnoses:  COPD exacerbation (HCC)      NEW MEDICATIONS STARTED DURING THIS VISIT:  ED Discharge Orders     None           This chart was dictated using voice recognition software/Dragon. Despite best efforts to proofread, errors can occur which can change the meaning. Any change was purely unintentional.     Racheal Patches, PA-C 01/06/21 2059    Sharyn Creamer, MD 01/07/21 [phone]

## 2021-01-06 NOTE — Progress Notes (Signed)
Patient arrived via ems on cpap mask due to difficulty breathing. Patient has a shiley 6.0 cuffless trach in place with speaking valve. Placed patient on vent per Dr Fanny Bien. Dr Fanny Bien requested make adjustments to settings to allow patient to be able to breath as well. Placed patient on SIMV/PS. Volume 400 rate 16 peep 5 PS 15.  Patient has done remarkably well and attempts to talk. She is alert and able to answer questions appropriately.  Discussed VBG results with Gala Romney -PA who is now taking care of patient. Addressed pCO2 95 PH 7.42. High pCO2 may be associated with cuffless trach in place as well.  He states to leave trach in for now as patient's condition has improved. 6 cuffed flexi trach at bedside in addition to ambu bag and disposible inner cannulas

## 2021-01-06 NOTE — Progress Notes (Signed)
Patient transferred to icu room 8 without incident 2140 with RN.

## 2021-01-06 NOTE — Progress Notes (Signed)
eLink Physician-Brief Progress Note Patient Name: Virginia [name] DOB: [birth year] MRN: [id number]   Date of Service  01/06/2021  HPI/Events of Note  Patient admitted via  ED with acute on chronic respiratory failure likely secondary to acute exacerbation of COPD, CXR shows prominent, likely chronic, interstitial markings, but no convincing acute infiltrates, respiratory status is much improved following symptomatic Rx in the ED.  eICU Interventions  New Patient Evaluation.        Thomasene Lot Rodgers Likes 01/06/2021, 10:51 PM

## 2021-01-06 NOTE — Progress Notes (Signed)
Found cuffless trach out. Vent alarming. Patient however in no distress. With Dr Fanny Bien, cuffless changed to cuffed shiley #6 flexi tube. Positive color change noted with end tidal. BBS noted with auscultation. Placed patient back on vent

## 2021-01-06 NOTE — ED Notes (Signed)
MD and RT at bedside to assess pt.

## 2021-01-06 NOTE — ED Triage Notes (Signed)
Pt biba via [facility] c/o of resp distress. Pt has trach and hx of COPD. Ems reports that pt is normally on 8lpm via [place] at home. Pt also has at home CPAP that was not effective. EMS reports lung sounds diminished with wheezes. EMS states ra saturation was 50% nd improved to 95% on Cpap.   125 mg solumedrol and duoneb given.

## 2021-01-06 NOTE — Progress Notes (Signed)
Received information from ER charge nurse, apparently actual nurse not able to use phone.

## 2021-01-06 NOTE — ED Provider Notes (Signed)
Medical screening examination/treatment/procedure(s) were conducted as a shared visit with non-physician practitioner(s) and myself.  I personally evaluated the patient during the encounter.  ----------------------------------------- 7:43 PM on 01/06/2021 ----------------------------------------- Personally seen and evaluated the patient.  Assisted and discussed ventilator modalities and settings with respiratory therapy  Patient is resting much more comfortably now tolerating pressure support ventilation.  Noted at approximately 10:30 PM, exact time I did not document fortunately.  Noted by respiratory that the patient's uncuffed trach had accidentally fallen out.  I assisted respiratory therapy, and personally placed the patient on a new 6 oh cuffed tube which she tolerated very well.  She had a very brief desaturation with O2 saturation into the high 70% range lasting less than 30 seconds with improvement after new cuffed trach inserted.  Verified with end-tidal CO2 as well as O2 saturation and patient comfort she reports much feeling much improved.     Sharyn Creamer, MD 01/07/21 [phone]

## 2021-01-06 NOTE — ED Notes (Signed)
Please call daugh. In law with update Sherri @ [phone]

## 2021-01-07 ENCOUNTER — Encounter: Payer: Self-pay | Admitting: Internal Medicine

## 2021-01-07 DIAGNOSIS — J9621 Acute and chronic respiratory failure with hypoxia: Secondary | ICD-10-CM | POA: Diagnosis not present

## 2021-01-07 DIAGNOSIS — J441 Chronic obstructive pulmonary disease with (acute) exacerbation: Secondary | ICD-10-CM | POA: Diagnosis not present

## 2021-01-07 DIAGNOSIS — Z9911 Dependence on respirator [ventilator] status: Secondary | ICD-10-CM

## 2021-01-07 DIAGNOSIS — E785 Hyperlipidemia, unspecified: Secondary | ICD-10-CM

## 2021-01-07 DIAGNOSIS — F419 Anxiety disorder, unspecified: Secondary | ICD-10-CM

## 2021-01-07 LAB — BLOOD GAS, ARTERIAL
Acid-Base Excess: 27.5 mmol/L — ABNORMAL HIGH (ref 0.0–2.0)
Bicarbonate: 54.6 mmol/L — ABNORMAL HIGH (ref 20.0–28.0)
FIO2: 0.35
MECHVT: 400 mL
O2 Saturation: 35.7 %
PEEP: 5 cmH2O
Patient temperature: 37
RATE: 16 resp/min
pCO2 arterial: 70 mmHg (ref 32.0–48.0)
pH, Arterial: 7.5 — ABNORMAL HIGH (ref 7.350–7.450)
pO2, Arterial: <31 mmHg — CL (ref 83.0–108.0)

## 2021-01-07 LAB — BLOOD GAS, VENOUS
Acid-Base Excess: 32.1 mmol/L — ABNORMAL HIGH (ref 0.0–2.0)
Bicarbonate: 61.6 mmol/L — ABNORMAL HIGH (ref 20.0–28.0)
FIO2: 35
MECHVT: 400 mL
Mechanical Rate: 16
O2 Saturation: 30.5 %
PEEP: 5 cmH2O
Patient temperature: 37
Pressure support: 15 cmH2O
pCO2, Ven: 95 mmHg (ref 44.0–60.0)
pH, Ven: 7.42 (ref 7.250–7.430)
pO2, Ven: <31 mmHg — CL (ref 32.0–45.0)

## 2021-01-07 LAB — PROCALCITONIN
Procalcitonin: <0.1 ng/mL
Procalcitonin: <0.1 ng/mL

## 2021-01-07 LAB — GLUCOSE, CAPILLARY
Glucose-Capillary: 121 mg/dL — ABNORMAL HIGH (ref 70–99)
Glucose-Capillary: 119 mg/dL — ABNORMAL HIGH (ref 70–99)
Glucose-Capillary: 144 mg/dL — ABNORMAL HIGH (ref 70–99)
Glucose-Capillary: 153 mg/dL — ABNORMAL HIGH (ref 70–99)
Glucose-Capillary: 144 mg/dL — ABNORMAL HIGH (ref 70–99)
Glucose-Capillary: 122 mg/dL — ABNORMAL HIGH (ref 70–99)

## 2021-01-07 LAB — BASIC METABOLIC PANEL
Anion gap: 15 (ref 5–15)
BUN: 18 mg/dL (ref 8–23)
CO2: 41 mmol/L — ABNORMAL HIGH (ref 22–32)
Calcium: 9 mg/dL (ref 8.9–10.3)
Chloride: 81 mmol/L — ABNORMAL LOW (ref 98–111)
Creatinine, Ser: 0.83 mg/dL (ref 0.44–1.00)
GFR, Estimated: >60 mL/min (ref >60)
Glucose, Bld: 124 mg/dL — ABNORMAL HIGH (ref 70–99)
Potassium: 5 mmol/L (ref 3.5–5.1)
Sodium: 137 mmol/L (ref 135–145)

## 2021-01-07 LAB — CBC
HCT: 32.5 % — ABNORMAL LOW (ref 36.0–46.0)
Hemoglobin: 9.6 g/dL — ABNORMAL LOW (ref 12.0–15.0)
MCH: 29.9 pg (ref 26.0–34.0)
MCHC: 29.5 g/dL — ABNORMAL LOW (ref 30.0–36.0)
MCV: 101.2 fL — ABNORMAL HIGH (ref 80.0–100.0)
Platelets: 225 10*3/uL (ref 150–400)
RBC: 3.21 MIL/uL — ABNORMAL LOW (ref 3.87–5.11)
RDW: 13.4 % (ref 11.5–15.5)
WBC: 6.5 10*3/uL (ref 4.0–10.5)
nRBC: 0 % (ref 0.0–0.2)

## 2021-01-07 LAB — MAGNESIUM: Magnesium: 2.4 mg/dL (ref 1.7–2.4)

## 2021-01-07 LAB — MRSA NEXT GEN BY PCR, NASAL: MRSA by PCR Next Gen: DETECTED — AB

## 2021-01-07 LAB — PHOSPHORUS: Phosphorus: 2.2 mg/dL — ABNORMAL LOW (ref 2.5–4.6)

## 2021-01-07 MED ORDER — ORAL CARE MOUTH RINSE
15.0000 mL | OROMUCOSAL | Status: DC
  Administered 2021-01-07 – 2021-01-11 (×36): 15 mL via OROMUCOSAL
  Filled 2021-01-07: qty 15

## 2021-01-07 MED ORDER — MUPIROCIN 2 % EX OINT
TOPICAL_OINTMENT | Freq: Two times a day (BID) | CUTANEOUS | Status: DC
  Administered 2021-01-07: 1 via NASAL
  Filled 2021-01-07 (×2): qty 22

## 2021-01-07 MED ORDER — CHLORHEXIDINE GLUCONATE 0.12% ORAL RINSE (MEDLINE KIT)
15.0000 mL | Freq: Two times a day (BID) | OROMUCOSAL | Status: DC
  Administered 2021-01-07 – 2021-01-11 (×7): 15 mL via OROMUCOSAL
  Filled 2021-01-07 (×2): qty 15

## 2021-01-07 MED ORDER — DEXMEDETOMIDINE HCL IN NACL 400 MCG/100ML IV SOLN
0.4000 ug/kg/h | INTRAVENOUS | Status: DC
  Administered 2021-01-07: 0.4 ug/kg/h via INTRAVENOUS
  Administered 2021-01-07: 0.6 ug/kg/h via INTRAVENOUS
  Administered 2021-01-08 (×2): 0.8 ug/kg/h via INTRAVENOUS
  Filled 2021-01-07 (×5): qty 100

## 2021-01-07 MED ORDER — SODIUM PHOSPHATES 45 MMOLE/15ML IV SOLN
10.0000 mmol | Freq: Once | INTRAVENOUS | Status: AC
  Administered 2021-01-07: 10 mmol via INTRAVENOUS
  Filled 2021-01-07: qty 3.33

## 2021-01-07 MED ORDER — CHLORHEXIDINE GLUCONATE CLOTH 2 % EX PADS
6.0000 | MEDICATED_PAD | Freq: Every day | CUTANEOUS | Status: DC
  Administered 2021-01-07 – 2021-01-11 (×5): 6 via TOPICAL

## 2021-01-07 NOTE — Progress Notes (Signed)
Pharmacy Antibiotic Note  Virginia [name] is a 70 y.o. female admitted on 01/06/2021 with COPD exacerbation/HCAP with concerns of possible Pseudomonas.  Pharmacy has been consulted for Cefepime dosing.  Plan: Cefepime 2 gm q12h per indication and renal function.  Pharmacy will continue to follow and adjust dosing whenever warranted.  Height: 5\' 2"  (157.5 cm) Weight: 57.4 kg (126 lb 8.7 oz) IBW/kg (Calculated) : 50.1  Temp (24hrs), Avg:98.6 F (37 C), Min:98.6 F (37 C), Max:98.6 F (37 C)  Recent Labs  Lab 01/06/21 1924  WBC 11.4*  CREATININE 0.75    Estimated Creatinine Clearance: 51.8 mL/min (by C-G formula based on SCr of 0.75 mg/dL).    No Known Allergies  Antimicrobials this admission: 7/17 Cefepime >>   Microbiology results: 7/16 RespCx: Pending  Thank you for allowing pharmacy to be a part of this patient's care.  8/16, PharmD, [facility] 01/07/2021 12:25 AM

## 2021-01-07 NOTE — Progress Notes (Signed)
Pharmacy Electrolyte Monitoring Consult:  Pharmacy consulted to assist in monitoring and replacing electrolytes in this 70 y.o. female admitted on 01/06/2021 with Respiratory Distress Afib/AECOPD/PNA  Labs:  Sodium (mmol/L)  Date Value  01/07/2021 137   Potassium (mmol/L)  Date Value  01/07/2021 5.0   Magnesium (mg/dL)  Date Value  33/29/5188 2.4   Phosphorus (mg/dL)  Date Value  41/66/0630 2.2 (L)   Calcium (mg/dL)  Date Value  16/06/930 9.0   Albumin (g/dL)  Date Value  35/57/3220 4.1   PTA meds: Lasix, lisinopril, Magnesium  Assessment/Plan: K 5.0  Mag 2.4  Phos 2.2  Scr 0.83  Na 137 -on vent/trach -will order Sodium Phosphate 10 mmol IV x 1 -f/u electrolytes with am labs   Linley Moxley A 01/07/2021 8:41 AM

## 2021-01-07 NOTE — Progress Notes (Signed)
ANTICOAGULATION CONSULT NOTE  Pharmacy Consult for Lovenox Dosing Indication: atrial fibrillation  No Known Allergies  Patient Measurements: Height: 5\' 2"  (157.5 cm) Weight: 57.4 kg (126 lb 8.7 oz) IBW/kg (Calculated) : 50.1  Vital Signs: Temp: 98.6 F (37 C) (07/16 1927) Temp Source: Oral (07/16 1927) BP: 102/87 (07/16 2200) Pulse Rate: 97 (07/16 2300)  Labs: Recent Labs    01/06/21 1924 01/06/21 2248  HGB 10.5*  --   HCT 34.9*  --   PLT 328  --   CREATININE 0.75  --   TROPONINIHS 12 14    Estimated Creatinine Clearance: 51.8 mL/min (by C-G formula based on SCr of 0.75 mg/dL).   Medical History: Past Medical History:  Diagnosis Date   Anemia    Anxiety    Arthritis    Atrial fibrillation (HCC)    C. difficile colitis 09/2015   COPD (chronic obstructive pulmonary disease) (HCC)    Depression    Dyspnea    Dysrhythmia    Fibromyalgia    H/O tracheostomy    Hep C w/ coma, chronic    Hypertension    MRSA pneumonia (HCC) 2017   On home oxygen therapy    3 L/M    Osteoporosis    Peripheral neuropathy    RLS (restless legs syndrome)    S/P percutaneous endoscopic gastrostomy (PEG) tube placement (HCC) 09/2015   Ventilator associated pneumonia (HCC) 10/2015   [facility]    Medications:  PTA meds:  Eliquis 5 mg BID, last dose on 7/15  Assessment: Pt is 70 yo female w/ trach and hx of COPD, presenting to ED with acute on chronic respiratory failure likely secondary to acute exacerbation of COPD.  Goal of Therapy:  Anti-Xa level 0.6-1 units/ml 4hrs after LMWH dose given Monitor platelets by anticoagulation protocol: Yes   Plan:  Lovenox 1 mg/kg q12h. Check CBC q72hr and monitor SCr. Will check LMWH level if/when warranted.  66, PharmD, [facility] 01/07/2021 12:33 AM

## 2021-01-07 NOTE — Progress Notes (Signed)
Patient due to void.  Straight cath last completed at 0600.  Bladder scan showed at 1052 and at 1400.  MD notified.

## 2021-01-07 NOTE — ED Provider Notes (Signed)
CRITICAL CARE Performed by: Sharyn Creamer   Total critical care time: 30 minutes  Critical care time was exclusive of separately billable procedures and treating other patients.  Critical care was necessary to treat or prevent imminent or life-threatening deterioration.  Critical care was time spent personally by me on the following activities: development of treatment plan with patient and/or surrogate as well as nursing, discussions with consultants, evaluation of patient's response to treatment, examination of patient, obtaining history from patient or surrogate, ordering and performing treatments and interventions, ordering and review of laboratory studies, ordering and review of radiographic studies, pulse oximetry and re-evaluation of patient's condition.    Sharyn Creamer, MD 01/07/21 [phone]

## 2021-01-07 NOTE — ED Provider Notes (Signed)
TRACHEOSTOMY REPLACEMENT  Date/Time: 01/07/2021 12:10 AM Performed by: Sharyn Creamer, MD Authorized by: Sharyn Creamer, MD  Consent: The procedure was performed in an emergent situation. Verbal consent obtained. Consent given by: patient Patient understanding: patient states understanding of the procedure being performed Site marked: the operative site was not marked Imaging studies: imaging studies available Indications: became dislodged Local anesthesia used: no  Anesthesia: Local anesthesia used: no  Sedation: Patient sedated: no  Tube type: Shiley 6.0, cuffed. Cuff inflation: inflated Complications comment: brief desat (< 30 seconds)      Sharyn Creamer, MD 01/07/21 [id number]

## 2021-01-07 NOTE — Progress Notes (Signed)
NAME:  Virginia [name], MRN:  [id number], DOB:  1951/04/26, LOS: 1 ADMISSION DATE:  01/06/2021   BRIEF SYNOPSIS 70 year old female who presented to [facility] ED via EMS from home with complaints of respiratory distress. Family called EMS after patient's symptoms of increased work of breathing and dyspnea were not relieved with increased O2 via nasal cannula and subsequently CPAP.  Upon EMS arrival patient was visibly in respiratory distress with SPO2 reading in the 50s.  EMS administered 125 mg of Solu-Medrol. History of Present Illness:  Severe resp distress and faikluyre Placed on vent S/p trach  Significant Hospital Events: Including procedures, antibiotic start and stop dates in addition to other pertinent events   01/06/2021-admit to ICU with acute on chronic hypoxic /hypercapnic respiratory failure on mechanical ventilation via chronic tracheostomy 7/17 severe resp failure remains on vent     Antimicrobials:   Antibiotics Given (last 72 hours)     Date/Time Action Medication Dose Rate   01/07/21 0003 New Bag/Given   ceFEPIme (MAXIPIME) 2 g in sodium chloride 0.9 % 100 mL IVPB 2 g 200 mL/hr            Interim History / Subjective:  Remains critically ill On vent Severe end stage resp failure and end stage COPD       Objective   Blood pressure (!) 114/97, pulse (!) 113, temperature 98.6 F (37 C), temperature source Oral, resp. rate 20, height '5\' 2"'  (1.575 m), weight 57.4 kg, SpO2 100 %.    Vent Mode: PSV;SIMV FiO2 (%):  [35 %] 35 % Set Rate:  [16 bmp] 16 bmp Vt Set:  [400 mL] 400 mL PEEP:  [5 cmH20] 5 cmH20 Pressure Support:  [15 cmH20] 15 cmH20 Plateau Pressure:  [20 cmH20] 20 cmH20   Intake/Output Summary (Last 24 hours) at 01/07/2021 0716 Last data filed at 01/07/2021 0600 Gross per 24 hour  Intake --  Output 450 ml  Net -450 ml   Filed Weights   01/06/21 1928  Weight: 57.4 kg      REVIEW OF SYSTEMS  PATIENT IS UNABLE TO PROVIDE COMPLETE  REVIEW OF SYSTEMS DUE TO SEVERE CRITICAL ILLNESS AND TOXIC METABOLIC ENCEPHALOPATHY   PHYSICAL EXAMINATION:  GENERAL:critically ill appearing, +resp distress HEAD: Normocephalic, atraumatic.  EYES: Pupils equal, round, reactive to light.  No scleral icterus.  MOUTH: Moist mucosal membrane. NECK: Supple. S/p trach PULMONARY: +rhonchi, +wheezing CARDIOVASCULAR: S1 and S2. Regular rate and rhythm. No murmurs, rubs, or gallops.  GASTROINTESTINAL: Soft, nontender, -distended. Positive bowel sounds.  MUSCULOSKELETAL: No swelling, clubbing, or edema.  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 in the setting of COPD exacerbation  PMHx: Severe COPD   Severe ACUTE Hypoxic and Hypercapnic Respiratory Failure -continue Mechanical Ventilator support -continue Bronchodilator Therapy -Wean Fio2 and PEEP as tolerated -VAP/VENT bundle implementation -will perform SAT/SBT when respiratory parameters are met  Vent Mode: PSV;SIMV FiO2 (%):  [35 %] 35 % Set Rate:  [16 bmp] 16 bmp Vt Set:  [400 mL] 400 mL PEEP:  [5 cmH20] 5 cmH20 Pressure Support:  [15 cmH20] 15 cmH20 Plateau Pressure:  [20 cmH20] 20 cmH20   SEVERE COPD EXACERBATION -continue IV steroids as prescribed -continue NEB THERAPY as prescribed -morphine as needed -wean fio2 as needed and tolerated   CARDIAC FAILURE- P AFIB -oxygen as needed -Lasix  as tolerated -follow up cardiac enzymes as indicated Continue [facility]   CARDIAC ICU monitoring    NEUROLOGY Acute toxic metabolic encephalopathy, need for sedation Goal RASS -2 to -3   INFECTIOUS DISEASE therapy for CAP -continue antibiotics as prescribed -follow up cultures   ENDO - ICU hypoglycemic\Hyperglycemia protocol -check FSBS per protocol   GI GI  PROPHYLAXIS as indicated  NUTRITIONAL STATUS DIET-->TF's as tolerated Constipation protocol as indicated   ELECTROLYTES -follow labs as needed -replace as needed -pharmacy consultation and following    Best Practice (right click and "Reselect all SmartList Selections" daily)  Diet/type: NPO DVT prophylaxis: systemic dose LMWH GI prophylaxis: PPI Lines: N/A Foley:  N/A Code Status:  full code Last date of multidisciplinary goals of care discussion 01/06/21    Labs   CBC: Recent Labs  Lab 01/06/21 1924 01/07/21 0423  WBC 11.4* 6.5  NEUTROABS 9.6*  --   HGB 10.5* 9.6*  HCT 34.9* 32.5*  MCV 103.3* 101.2*  PLT 328 814    Basic Metabolic Panel: Recent Labs  Lab 01/06/21 1924 01/07/21 0423  NA 136 137  K 4.2 5.0  CL 77* 81*  CO2 45* 41*  GLUCOSE 111* 124*  BUN 17 18  CREATININE 0.75 0.83  CALCIUM 9.2 9.0  MG  --  2.4  PHOS  --  2.2*   GFR: Estimated Creatinine Clearance: 49.9 mL/min (by C-G formula based on SCr of 0.83 mg/dL). Recent Labs  Lab 01/06/21 1924 01/07/21 0423  PROCALCITON <0.10 <0.10  WBC 11.4* 6.5    Liver Function Tests: Recent Labs  Lab 01/06/21 1924  AST 27  ALT 21  ALKPHOS 29*  BILITOT 1.0  PROT 7.4  ALBUMIN 4.1   No results for input(s): LIPASE, AMYLASE in the last 168 hours. No results for input(s): AMMONIA in the last 168 hours.  ABG    Component Value Date/Time   PHART 7.50 (H) 01/07/2021 0500   PCO2ART 70 (HH) 01/07/2021 0500   PO2ART <31.0 (LL) 01/07/2021 0500   HCO3 54.6 (H) 01/07/2021 0500   O2SAT 35.7 01/07/2021 0500     Coagulation Profile: No results for input(s): INR, PROTIME in the last 168 hours.  Cardiac Enzymes: No results for input(s): CKTOTAL, CKMB, CKMBINDEX, TROPONINI in the last 168 hours.  HbA1C: Hgb A1c MFr Bld  Date/Time Value Ref Range Status  06/06/2020 12:07 PM 5.3 4.8 - 5.6 % Final    Comment:    (NOTE) Pre diabetes:          5.7%-6.4%  Diabetes:              >6.4%  Glycemic  control for   <7.0% adults with diabetes     CBG: Recent Labs  Lab 01/07/21 0352  GLUCAP 121*    Allergies No Known Allergies     DVT/GI PRX  assessed I Assessed the need for Labs I Assessed the need for Foley I Assessed the need for Central Venous Line Family Discussion when available I Assessed the need for Mobilization I made an Assessment of medications to be adjusted accordingly Safety Risk assessment completed  CASE DISCUSSED IN MULTIDISCIPLINARY ROUNDS WITH ICU TEAM     Critical Care Time devoted to patient care services described in this note is 55 minutes.  Critical care was necessary to treat or prevent imminent or life-threatening deterioration.    Corrin Parker, M.D.  [facility]  Medical Director [place] Director [facility] Cardio-Pulmonary Department

## 2021-01-07 NOTE — Progress Notes (Signed)
No urine output via purewick or incontinence in bed, performed bladder scan, >449 in bladder, performed intermittent urinary catherization with no difficulty, obtained 450 of clear yellow urine.

## 2021-01-08 DIAGNOSIS — J9621 Acute and chronic respiratory failure with hypoxia: Secondary | ICD-10-CM | POA: Diagnosis not present

## 2021-01-08 DIAGNOSIS — E440 Moderate protein-calorie malnutrition: Secondary | ICD-10-CM | POA: Insufficient documentation

## 2021-01-08 DIAGNOSIS — J9622 Acute and chronic respiratory failure with hypercapnia: Secondary | ICD-10-CM | POA: Diagnosis not present

## 2021-01-08 DIAGNOSIS — I480 Paroxysmal atrial fibrillation: Secondary | ICD-10-CM

## 2021-01-08 LAB — GLUCOSE, CAPILLARY
Glucose-Capillary: 102 mg/dL — ABNORMAL HIGH (ref 70–99)
Glucose-Capillary: 104 mg/dL — ABNORMAL HIGH (ref 70–99)
Glucose-Capillary: 106 mg/dL — ABNORMAL HIGH (ref 70–99)
Glucose-Capillary: 139 mg/dL — ABNORMAL HIGH (ref 70–99)
Glucose-Capillary: 143 mg/dL — ABNORMAL HIGH (ref 70–99)
Glucose-Capillary: 147 mg/dL — ABNORMAL HIGH (ref 70–99)
Glucose-Capillary: 162 mg/dL — ABNORMAL HIGH (ref 70–99)
Glucose-Capillary: 183 mg/dL — ABNORMAL HIGH (ref 70–99)

## 2021-01-08 LAB — CBC
HCT: 27.8 % — ABNORMAL LOW (ref 36.0–46.0)
Hemoglobin: 8.9 g/dL — ABNORMAL LOW (ref 12.0–15.0)
MCH: 30.5 pg (ref 26.0–34.0)
MCHC: 32 g/dL (ref 30.0–36.0)
MCV: 95.2 fL (ref 80.0–100.0)
Platelets: 267 10*3/uL (ref 150–400)
RBC: 2.92 MIL/uL — ABNORMAL LOW (ref 3.87–5.11)
RDW: 14.1 % (ref 11.5–15.5)
WBC: 7 10*3/uL (ref 4.0–10.5)
nRBC: 0 % (ref 0.0–0.2)

## 2021-01-08 LAB — HEMOGLOBIN A1C
Hgb A1c MFr Bld: 4.9 % (ref 4.8–5.6)
Mean Plasma Glucose: 93.93 mg/dL

## 2021-01-08 LAB — BASIC METABOLIC PANEL
Anion gap: 12 (ref 5–15)
BUN: 23 mg/dL (ref 8–23)
CO2: 40 mmol/L — ABNORMAL HIGH (ref 22–32)
Calcium: 8.9 mg/dL (ref 8.9–10.3)
Chloride: 84 mmol/L — ABNORMAL LOW (ref 98–111)
Creatinine, Ser: 0.87 mg/dL (ref 0.44–1.00)
GFR, Estimated: >60 mL/min (ref >60)
Glucose, Bld: 173 mg/dL — ABNORMAL HIGH (ref 70–99)
Potassium: 3.3 mmol/L — ABNORMAL LOW (ref 3.5–5.1)
Sodium: 136 mmol/L (ref 135–145)

## 2021-01-08 LAB — POTASSIUM: Potassium: 4.5 mmol/L (ref 3.5–5.1)

## 2021-01-08 LAB — PHOSPHORUS: Phosphorus: 3.7 mg/dL (ref 2.5–4.6)

## 2021-01-08 LAB — MAGNESIUM: Magnesium: 2.1 mg/dL (ref 1.7–2.4)

## 2021-01-08 LAB — PROCALCITONIN: Procalcitonin: <0.1 ng/mL

## 2021-01-08 MED ORDER — PREDNISONE 20 MG PO TABS
40.0000 mg | ORAL_TABLET | Freq: Every day | ORAL | Status: AC
  Administered 2021-01-09 – 2021-01-10 (×2): 40 mg via ORAL
  Filled 2021-01-08 (×2): qty 2

## 2021-01-08 MED ORDER — INSULIN ASPART 100 UNIT/ML IJ SOLN
0.0000 [IU] | INTRAMUSCULAR | Status: DC
  Administered 2021-01-08 (×2): 1 [IU] via SUBCUTANEOUS
  Filled 2021-01-08 (×2): qty 1

## 2021-01-08 MED ORDER — ENSURE ENLIVE PO LIQD
237.0000 mL | Freq: Two times a day (BID) | ORAL | Status: DC
  Administered 2021-01-08 – 2021-01-11 (×7): 237 mL via ORAL

## 2021-01-08 MED ORDER — AMITRIPTYLINE HCL 25 MG PO TABS
25.0000 mg | ORAL_TABLET | Freq: Every day | ORAL | Status: DC
  Administered 2021-01-08 – 2021-01-09 (×2): 25 mg via ORAL
  Filled 2021-01-08 (×2): qty 1

## 2021-01-08 MED ORDER — METOPROLOL TARTRATE 50 MG PO TABS
50.0000 mg | ORAL_TABLET | Freq: Two times a day (BID) | ORAL | Status: DC
  Administered 2021-01-08 – 2021-01-11 (×7): 50 mg via ORAL
  Filled 2021-01-08 (×7): qty 1

## 2021-01-08 MED ORDER — ADULT MULTIVITAMIN W/MINERALS CH
1.0000 | ORAL_TABLET | Freq: Every day | ORAL | Status: DC
  Administered 2021-01-09 – 2021-01-11 (×3): 1 via ORAL
  Filled 2021-01-08 (×5): qty 1

## 2021-01-08 MED ORDER — ESTRADIOL 1 MG PO TABS
1.0000 mg | ORAL_TABLET | Freq: Every day | ORAL | Status: DC
  Administered 2021-01-09 – 2021-01-11 (×3): 1 mg via ORAL
  Filled 2021-01-08 (×4): qty 1

## 2021-01-08 MED ORDER — PREDNISONE 10 MG PO TABS: 5.0000 mg | ORAL_TABLET | Freq: Every day | ORAL | Status: DC

## 2021-01-08 MED ORDER — DILTIAZEM HCL ER COATED BEADS 120 MG PO CP24
120.0000 mg | ORAL_CAPSULE | Freq: Every day | ORAL | Status: DC
  Administered 2021-01-09 – 2021-01-11 (×3): 120 mg via ORAL
  Filled 2021-01-08 (×5): qty 1

## 2021-01-08 MED ORDER — POTASSIUM CHLORIDE 10 MEQ/100ML IV SOLN
10.0000 meq | INTRAVENOUS | Status: AC
  Administered 2021-01-08 (×6): 10 meq via INTRAVENOUS
  Filled 2021-01-08 (×6): qty 100

## 2021-01-08 MED ORDER — ROSUVASTATIN CALCIUM 10 MG PO TABS
10.0000 mg | ORAL_TABLET | Freq: Every day | ORAL | Status: DC
  Administered 2021-01-08 – 2021-01-10 (×3): 10 mg via ORAL
  Filled 2021-01-08 (×3): qty 1

## 2021-01-08 MED ORDER — SODIUM CHLORIDE 0.9 % IV SOLN: INTRAVENOUS | Status: DC

## 2021-01-08 MED ORDER — IPRATROPIUM BROMIDE 0.02 % IN SOLN
0.5000 mg | Freq: Four times a day (QID) | RESPIRATORY_TRACT | Status: DC
  Administered 2021-01-08 – 2021-01-09 (×4): 0.5 mg via RESPIRATORY_TRACT
  Filled 2021-01-08 (×4): qty 2.5

## 2021-01-08 MED ORDER — QUETIAPINE FUMARATE 25 MG PO TABS
25.0000 mg | ORAL_TABLET | Freq: Every day | ORAL | Status: DC
  Administered 2021-01-08 – 2021-01-10 (×3): 25 mg via ORAL
  Filled 2021-01-08 (×3): qty 1

## 2021-01-08 MED ORDER — DIAZEPAM 5 MG PO TABS
2.5000 mg | ORAL_TABLET | Freq: Three times a day (TID) | ORAL | Status: DC | PRN
  Administered 2021-01-08 – 2021-01-11 (×7): 2.5 mg via ORAL
  Filled 2021-01-08 (×7): qty 1

## 2021-01-08 MED ORDER — APIXABAN 5 MG PO TABS
5.0000 mg | ORAL_TABLET | Freq: Two times a day (BID) | ORAL | Status: DC
  Administered 2021-01-08 – 2021-01-11 (×6): 5 mg via ORAL
  Filled 2021-01-08 (×6): qty 1

## 2021-01-08 MED ORDER — GABAPENTIN 300 MG PO CAPS
300.0000 mg | ORAL_CAPSULE | Freq: Three times a day (TID) | ORAL | Status: DC
  Administered 2021-01-08 – 2021-01-11 (×10): 300 mg via ORAL
  Filled 2021-01-08 (×7): qty 1
  Filled 2021-01-08: qty 3
  Filled 2021-01-08 (×2): qty 1

## 2021-01-08 MED ORDER — ARFORMOTEROL TARTRATE 15 MCG/2ML IN NEBU
15.0000 ug | INHALATION_SOLUTION | Freq: Two times a day (BID) | RESPIRATORY_TRACT | Status: DC
  Administered 2021-01-08 – 2021-01-11 (×7): 15 ug via RESPIRATORY_TRACT
  Filled 2021-01-08 (×9): qty 2

## 2021-01-08 MED ORDER — PANTOPRAZOLE SODIUM 40 MG PO TBEC
40.0000 mg | DELAYED_RELEASE_TABLET | Freq: Every day | ORAL | Status: DC
  Administered 2021-01-08 – 2021-01-11 (×4): 40 mg via ORAL
  Filled 2021-01-08 (×4): qty 1

## 2021-01-08 MED ORDER — PREDNISONE 10 MG PO TABS
5.0000 mg | ORAL_TABLET | Freq: Every day | ORAL | Status: DC
  Administered 2021-01-11: 5 mg via ORAL
  Filled 2021-01-08: qty 1

## 2021-01-08 NOTE — Evaluation (Addendum)
Clinical/Bedside Swallow Evaluation Patient Details  Name: Virginia [name] MRN: [id number] Date of Birth: [birth year]  Today's Date: 01/08/2021 Time: SLP Start Time (ACUTE ONLY): 1205 SLP Stop Time (ACUTE ONLY): 1305 SLP Time Calculation (min) (ACUTE ONLY): 60 min  Past Medical History:  Past Medical History:  Diagnosis Date   Anemia    Anxiety    Arthritis    Atrial fibrillation (HCC)    C. difficile colitis 09/2015   COPD (chronic obstructive pulmonary disease) (HCC)    Depression    Dyspnea    Dysrhythmia    Fibromyalgia    H/O tracheostomy    Hep C w/ coma, chronic    Hypertension    MRSA pneumonia (HCC) 2017   On home oxygen therapy    3 L/M    Osteoporosis    Peripheral neuropathy    RLS (restless legs syndrome)    S/P percutaneous endoscopic gastrostomy (PEG) tube placement (HCC) 09/2015   Ventilator associated pneumonia (HCC) 10/2015   [facility], [place]   Past Surgical History:  Past Surgical History:  Procedure Laterality Date   ABDOMINAL HYSTERECTOMY     BREAST SURGERY Bilateral    Breast Implants   DILATION AND CURETTAGE OF UTERUS     PEG PLACEMENT N/A 06/21/2020   Procedure: PERCUTANEOUS ENDOSCOPIC GASTROSTOMY (PEG) PLACEMENT;  Surgeon: Regis Bill, MD;  Location: [facility] ENDOSCOPY;  Service: Endoscopy;  Laterality: N/A;   REVERSE SHOULDER ARTHROPLASTY Right 03/06/2017   Procedure: REVERSE SHOULDER ARTHROPLASTY;  Surgeon: Christena Flake, MD;  Location: [facility] ORS;  Service: Orthopedics;  Laterality: Right;   ROTATOR CUFF REPAIR Bilateral    TRACHEOSTOMY TUBE PLACEMENT N/A 06/15/2020   Procedure: TRACHEOSTOMY;  Surgeon: Geanie Logan, MD;  Location: [facility] ORS;  Service: ENT;  Laterality: N/A;   HPI:  Pt is a 70 year old female w/ MULTIPLE medical dxs including Chronic Trachostomy,  severe protein malnutrition, fibromyalgia, GERD, mood disorder, declined Pulmonary status w/ COPD who presented to [facility] ED via EMS from home with complaints of  respiratory distress. Family called EMS after patient's symptoms of increased work of breathing and dyspnea were not relieved with increased O2 via nasal cannula and subsequently CPAP.  Upon EMS arrival patient was visibly in respiratory distress with SPO2 reading in the 50s.  EMS administered 125 mg of Solu-Medrol.  ED course:  Patient transitioned from CPAP via EMS to ventilatory support.  Per ED provider report patient had very diminished lung sounds upon arrival, poor ventilation. She received DuoNeb treatments and mag sulfate IV with much improvement in work of breathing and SPO2.  Pt is admitted w/ dx of acute on chronic hypoxic and hypercarbic respiratory failure in the setting of COPD exacerbation. At eval, Lungs are clear to auscultation per NP.  CXR: No acute cardiopulmonary disease.  No change from the prior exam.  OF NOTE: pt had a CUFFED shiley 6 placed in the ED d/t noted vent support initially after admit.   Assessment / Plan / Recommendation Clinical Impression  BSE and PMV evals completed. Pt seen for both PMV and BSE today per MD order. Pt has a Chronic Tracheostomy and has used the PMV at home previously per chart notes from admit to this services 05/2020-07/2020. Pt continues on TC wean today per RT. Pt was alert, talkative during session; suspect mild Cognitive impairment, easily distracted and required verbal cues for follow through w/ tasks.    Explained the use and wear of the PMV to pt; trach and stoma area inspected;  Shiley Cuffed trach #6. Pt's respiratory effort appeared min increased w/ any exertion including Talking and Moving to sit upright in bed. RR: 20-24, O2 sats 96-99%, HR 87. FiO2 35% on 10L per chart. Cuff deflated at baseline. Pt's PMV placed and pt was already verbalizing w/ Dtr in Law in room, then this SLP. Pt verbalized indicating her request for something to drink. Verbalizations and conversation were c/b grossly adequate volume and intelligibility w/ fair breath  support/effort, though gravely, min strained vocal quality present. Encouraged pt to focus on slowing down during conversation and using her breath support to support her speech/volume. PMV placement was tolerated during sessions w/out noted O2 desaturation, or significant change in RR/HR from 70 baseline. No overt discomfort noted in her respiratory effort -- pt stated it felt "fine" to wear/talk w/. No increased effort noted in respirations during the expiratory phase; no overt use of accessary muscles or distress was noted in 70 breathing pattern. PMV was allowed to remain on as pt consumed po's.   Pt appears to adequately tolerate PMV placement w/out overt, gross respiratory discomfort or distress; ANS remained adequately stable at her Baseline during wear/use. Suspect Shiley trach size, #6, is beneficial for comfortable PMV wear/use.  Much education was given on PMV use/wear, MUST have Cuff deflation for PMV wear, checking and removing the air from the balloon b/f placing, placing/removing the PMV, and care of the PMV. Discussed that is MUST be worn for all eating/drinking; and can be work w/ Therapies(OT, PT). Must NOT be worn when sleeping. Encouraged Rest Breaks at times during the day. Precautions posted at bedside and in chart.  Pt remains w/ a Cuffed trach during herwean at this time but NP/MD suspects pt will transition back to a CUFFLESS trach for D/C. Sticker placed on Cuff line, and in room. NSG made aware. MD updated. ST services will continue to monitor for any further needs while admitted.     BSE:  Pt seen for assessment of swallowing. She is able to wear the PMV comfortably for verbal communication and now for po intake. See session noted above.  Pt explained general aspiration precautions and agreed verbally to the need for following them especially sitting upright for all oral intake, No Talking w/ food and drink in mouth, and wearing PMV for ALL oral intake. Pt assisted w/ sitting up  and using multiple pillows behind her for a forward position. Noted min increased RR rate and effort d/t the moving in bed. After Rest Break, she was then given trials of thin liquids, ice chips, and purees/soft solids. NO overt clinical s/s of aspiration were noted w/ any consistency; No coughing, respiratory status remained relatively calm and overtly unlabored w/ Rest Breaks(as needed), vocal quality clear b/t trials. O2 sats 98%; HR 86; RR low 20s. Pt held Cup when drinking and fed self following instructions for single, small sips slowly given intermittent verbal cues. Straws were NOT utilized for better oral and Pulmonary control. [facility] management and timely A-P transfer for swallowing -- adequate mastication Time and effort noted w/ the softened solid trials given but warned against the other food consistencies as they may be too effortful thus increase WOB.  Oral clearing achieved w/ all consistencies given Time. Pt is Missing Bottom Dentition for full, effective mastication.     Recommended a Dysphagia level 3 diet (mech soft foods) w/ gravies added to moisten foods; Thin liquids via CUP only. Recommend aspiration precautions; Pills WHOLE in  Puree d/t Pulmonary status and for ease of swallowing; tray setup and positioning assistance Upright for meals. PMV MUST BE PLACED FOR ALL ORAL INTAKE. Monitoring w/ all oral intake. ST services will continue to f/u w/ pt for toleration of diet and education on precautions while admitted. NSG, NP, and MD updated on above. Precautions posted at bedside for PMV wear/use and aspiration precautions.  SLP Visit Diagnosis: Dysphagia, unspecified (R13.10) (missing Bottom Dentition; Tracheostomy; declined Pulmonary status baseline)    Aspiration Risk  Mild aspiration risk    Diet Recommendation  Dysphagia level 3(mech soft foods moistened) w/ Thin liquids via Cup; Recommend aspiration precautions; Pills WHOLE in Puree d/t Cognitive status  and for ease of swallowing; tray setup and positioning assistance Upright for meals. PMV MUST BE PLACED FOR ALL ORAL INTAKE. Monitoring during all oral intake.   Medication Administration: Whole meds with puree (for safer swallowing)    Other  Recommendations Recommended Consults:  (Dietician f/u) Oral Care Recommendations: Oral care BID;Oral care before and after PO;Staff/trained caregiver to provide oral care (assist) Other Recommendations:  (n/a)   Follow up Recommendations None (appears near/at baseline)      Frequency and Duration min 1 x/week  1 week       Prognosis Prognosis for Safe Diet Advancement: Fair Barriers to Reach Goals: Time post onset;Severity of deficits      Swallow Study   General Date of Onset: 01/06/21 HPI: Pt is a 70 year old female w/ MULTIPLE medical dxs including Chronic Trachostomy,  severe protein malnutrition, fibromyalgia, GERD, mood disorder, declined Pulmonary status w/ COPD who presented to [facility] ED via EMS from home with complaints of respiratory distress. Family called EMS after patient's symptoms of increased work of breathing and dyspnea were not relieved with increased O2 via nasal cannula and subsequently CPAP.  Upon EMS arrival patient was visibly in respiratory distress with SPO2 reading in the 50s.  EMS administered 125 mg of Solu-Medrol.  ED course:  Patient transitioned from CPAP via EMS to ventilatory support.  Per ED provider report patient had very diminished lung sounds upon arrival, poor ventilation. She received DuoNeb treatments and mag sulfate IV with much improvement in work of breathing and SPO2.  Pt is admitted w/ dx of acute on chronic hypoxic and hypercarbic respiratory failure in the setting of COPD exacerbation. At eval, Lungs are clear to auscultation per NP.  CXR: No acute cardiopulmonary disease.  No change from the prior exam.  OF NOTE: pt had a CUFFED shiley 6 placed in the ED d/t noted vent support initially after admit. Type  of Study: Bedside Swallow Evaluation Previous Swallow Assessment: 05/2020-07/2020 - rec'd a minced diet w/ thin liquids; pt preferred a Regular diet Diet Prior to this Study: NPO Temperature Spikes Noted: No (wbc 7.0) Respiratory Status: Trach Collar;Trach (10L) Trach Size and Type: Cuff;#6;With PMSV in place History of Recent Intubation: No (chronic trach) Behavior/Cognition: Alert;Cooperative;Pleasant mood;Distractible;Requires cueing Oral Cavity Assessment: Dry (min) Oral Care Completed by SLP: Yes Oral Cavity - Dentition: Dentures, top (No bottom Dentition) Vision: Functional for self-feeding Self-Feeding Abilities: Able to feed self;Needs assist;Needs set up Patient Positioning: Upright in bed (needed positioning support) Baseline Vocal Quality: Normal;Low vocal intensity Volitional Cough: Strong Volitional Swallow: Able to elicit    Oral/Motor/Sensory Function Overall Oral Motor/Sensory Function: Within functional limits   Ice Chips Ice chips: Within functional limits Presentation: Spoon (fed; 3 trials)   Thin Liquid Thin Liquid: Within functional limits Presentation: Cup;Self Fed (8+ trials)    Nectar  Thick Nectar Thick Liquid: Not tested   Honey Thick Honey Thick Liquid: Not tested   Puree Puree: Within functional limits Presentation: Self Fed (fed; 4 trials)   Solid     Solid: Impaired (min) Presentation: Spoon (fed; 3 trials) Oral Phase Impairments: Impaired mastication (missing bottom Dentition) Pharyngeal Phase Impairments:  (none)        Jerilynn Som, MS, [facility] [phone] Michel Hendon 01/08/2021,2:30 PM

## 2021-01-08 NOTE — Progress Notes (Addendum)
NAME:  Virginia [name], MRN:  [id number], DOB:  May 28, 1951, LOS: 2 ADMISSION DATE:  01/06/2021, CONSULTATION DATE: 01/06/2021 REFERRING MD: Shela Commons. Cuthriell, PA, CHIEF COMPLAINT:   Respiratory distress  History of Present Illness:  70 year old female who presented to [facility] ED via EMS from home with complaints of respiratory distress. Family called EMS after patient's symptoms of increased work of breathing and dyspnea were not relieved with increased O2 via nasal cannula and subsequently CPAP.  Upon EMS arrival patient was visibly in respiratory distress with SPO2 reading in the 50s.  EMS administered 125 mg of Solu-Medrol. ED course: Patient transitioned from CPAP via EMS to ventilatory support.  Per ED provider report patient had very diminished lung sounds upon arrival, poor ventilation. She received DuoNeb treatments and mag sulfate IV with much improvement in work of breathing and SPO2. Initial vitals: Afebrile at 98.6, tachypnea at 24, ST at 100, BP stable at 115/89 with SPO2 100% on ventilatory support at 35% FiO2. Significant labs: VBG -7.42, 95, < 31, 61.6, Cl 77, serum CO2- 45, BNP- 145.3, mild leukocytosis at 11.4, COVID-19/influenza a&b-negative  PCCM consulted for admission as the patient is requiring ventilatory support via her chronic tracheostomy.  Pertinent  Medical History  Severe COPD on chronic 5 L nasal cannula Chronic tracheostomy Anemia Atrial fibrillation on Eliquis Fibromyalgia Hypertension Hep C Anxiety & depression C. Difficile CAD Thoracic AA Significant Hospital Events: Including procedures, antibiotic start and stop dates in addition to other pertinent events   01/06/2021-admit to ICU with acute on chronic hypoxic /hypercapnic respiratory failure on mechanical ventilation via chronic tracheostomy 01/08/2021-pt tolerating trach collar FiO2 35% with passy muir speaking valve in place.  Speech therapy consulted to assess swallowing for potential diet order    Interim History / Subjective:  Pt currently tolerating trach collar FiO2 35% with O2 flow rate 10 no s/sx of respiratory distress   Labs reviewed 07/18 BUN/Cr.:  23/0.87 Serum CO2/ AG: 40/12  Hgb: 8.9  WBC/ TMAX: 7.0/ afebrile PCT: <0/10  CXR 01/06/21: No acute cardiopulmonary disease  Objective   Blood pressure 116/79, pulse 88, temperature 98.4 F (36.9 C), temperature source Oral, resp. rate 18, height 5\' 2"  (1.575 m), weight 56.4 kg, SpO2 96 %.    Vent Mode: PSV FiO2 (%):  [35 %] 35 % Set Rate:  [16 bmp-49 bmp] 16 bmp Vt Set:  [400 mL] 400 mL PEEP:  [5 cmH20] 5 cmH20 Pressure Support:  [12 cmH20-15 cmH20] 12 cmH20   Intake/Output Summary (Last 24 hours) at 01/08/2021 0905 Last data filed at 01/08/2021 0800 Gross per 24 hour  Intake 658.13 ml  Output 250 ml  Net 408.13 ml   Filed Weights   01/06/21 1928 01/08/21 0500  Weight: 57.4 kg 56.4 kg    Examination: General: chronically ill appearing female, resting in bed, NAD mechanical ventilation via trach  HEENT: MM pink/moist, atraumatic, neck supple Neuro: Alert, follows commands, mouths words although difficult to understand due to tracheostomy  CV:  s1s2 RRR, NSR with occasional PVC's, no M/R/G Pulm: diminished throughout, even, non labored  GI: soft, rounded, non tender, bs x 4 GU: foley in place with clear yellow urine Skin: No rashes/lesions, scattered ecchymosis noted Extremities: warm/dry, 2+ radial/2+ distal pulses present, no edema noted  Resolved Hospital Problem list     Assessment & Plan:  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 in the setting of COPD exacerbation  PMHx: Severe COPD Mechanical ventilation as needed for respiratory support, however  if tolerating trach collar pt to remain of ventilator VAP bundle implemented  Continue bronchodilator and nebulized steroids IV steroids for now~wean as tolerated   Chronic/Paroxsymal Atrial Fibrillation~currently nsr 07/18   Continuous telemetry monitoring  Will resume outpatient antiarrhythmics pending speech evaluation   Hypertension History of, currently normotensive - home regimen on hold d/t lack of PO access, restart lisinopril as able  Hyperlipidemia - home regimen on hold d/t lack of PO access, restart rosuvastatin as able  Hyperglycemia likely secondary to iv steroids  -CBG's q4hrs  -SSI   Fibromyalgia Anxiety & Depression - Continue prn precedex gtt and valium for now will resume outpatient antianxiety medications once able to tolerate po's    Best Practice (right click and "Reselect all SmartList Selections" daily)  Diet/type: NPO pending speech evaluation  DVT prophylaxis: systemic dose LMWH GI prophylaxis: PPI Lines: N/A Foley:  N/A Code Status:  full code Last date of multidisciplinary goals of care discussion 01/08/21 Labs   CBC: Recent Labs  Lab 01/06/21 1924 01/07/21 0423 01/08/21 0628  WBC 11.4* 6.5 7.0  NEUTROABS 9.6*  --   --   HGB 10.5* 9.6* 8.9*  HCT 34.9* 32.5* 27.8*  MCV 103.3* 101.2* 95.2  PLT 328 225 267    Basic Metabolic Panel: Recent Labs  Lab 01/06/21 1924 01/07/21 0423 01/08/21 0628  NA 136 137 136  K 4.2 5.0 3.3*  CL 77* 81* 84*  CO2 45* 41* 40*  GLUCOSE 111* 124* 173*  BUN 17 18 23   CREATININE 0.75 0.83 0.87  CALCIUM 9.2 9.0 8.9  MG  --  2.4 2.1  PHOS  --  2.2* 3.7   GFR: Estimated Creatinine Clearance: 47.6 mL/min (by C-G formula based on SCr of 0.87 mg/dL). Recent Labs  Lab 01/06/21 1924 01/07/21 0423 01/08/21 0628  PROCALCITON <0.10 <0.10 <0.10  WBC 11.4* 6.5 7.0    Liver Function Tests: Recent Labs  Lab 01/06/21 1924  AST 27  ALT 21  ALKPHOS 29*  BILITOT 1.0  PROT 7.4  ALBUMIN 4.1   No results for input(s): LIPASE, AMYLASE in the last 168 hours. No results for input(s): AMMONIA in the last 168 hours.  ABG    Component Value Date/Time   PHART 7.50 (H) 01/07/2021 0500   PCO2ART 70 (HH) 01/07/2021 0500   PO2ART  <31.0 (LL) 01/07/2021 0500   HCO3 54.6 (H) 01/07/2021 0500   O2SAT 35.7 01/07/2021 0500     Coagulation Profile: No results for input(s): INR, PROTIME in the last 168 hours.  Cardiac Enzymes: No results for input(s): CKTOTAL, CKMB, CKMBINDEX, TROPONINI in the last 168 hours.  HbA1C: Hgb A1c MFr Bld  Date/Time Value Ref Range Status  06/06/2020 12:07 PM 5.3 4.8 - 5.6 % Final    Comment:    (NOTE) Pre diabetes:          5.7%-6.4%  Diabetes:              >6.4%  Glycemic control for   <7.0% adults with diabetes     CBG: Recent Labs  Lab 01/07/21 1936 01/07/21 2300 01/08/21 0008 01/08/21 0347 01/08/21 0741  GLUCAP 144* 122* 147* 183* 162*    Review of Systems:   No complaints at this time   Past Medical History:  She,  has a past medical history of Anemia, Anxiety, Arthritis, Atrial fibrillation (HCC), C. difficile colitis (09/2015), COPD (chronic obstructive pulmonary disease) (HCC), Depression, Dyspnea, Dysrhythmia, Fibromyalgia, H/O tracheostomy, Hep C w/ coma, chronic, Hypertension, MRSA pneumonia (  HCC) (2017), On home oxygen therapy, Osteoporosis, Peripheral neuropathy, RLS (restless legs syndrome), S/P percutaneous endoscopic gastrostomy (PEG) tube placement (HCC) (09/2015), and Ventilator associated pneumonia (HCC) (10/2015).   Surgical History:   Past Surgical History:  Procedure Laterality Date   ABDOMINAL HYSTERECTOMY     BREAST SURGERY Bilateral    Breast Implants   DILATION AND CURETTAGE OF UTERUS     PEG PLACEMENT N/A 06/21/2020   Procedure: PERCUTANEOUS ENDOSCOPIC GASTROSTOMY (PEG) PLACEMENT;  Surgeon: Regis Bill, MD;  Location: [facility] ENDOSCOPY;  Service: Endoscopy;  Laterality: N/A;   REVERSE SHOULDER ARTHROPLASTY Right 03/06/2017   Procedure: REVERSE SHOULDER ARTHROPLASTY;  Surgeon: Christena Flake, MD;  Location: [facility] ORS;  Service: Orthopedics;  Laterality: Right;   ROTATOR CUFF REPAIR Bilateral    TRACHEOSTOMY TUBE PLACEMENT N/A 06/15/2020    Procedure: TRACHEOSTOMY;  Surgeon: Geanie Logan, MD;  Location: [facility] ORS;  Service: ENT;  Laterality: N/A;     Social History:   reports that she quit smoking about 5 years ago. Her smoking use included cigarettes. She smoked an average of 1.5 packs per day. She has never used smokeless tobacco. She reports that she does not drink alcohol and does not use drugs.   Family History:  Her family history includes Hypertension in her mother.   Allergies No Known Allergies   Home Medications  Prior to Admission medications   Medication Sig Start Date End Date Taking? Authorizing Provider  albuterol (VENTOLIN HFA) 108 (90 Base) MCG/ACT inhaler Inhale 2 puffs into the lungs every 4 (four) hours as needed for wheezing or shortness of breath.    [provider]  amitriptyline (ELAVIL) 25 MG tablet Take 25 mg by mouth at bedtime.  01/14/19   [provider]  apixaban (ELIQUIS) 5 MG TABS tablet Take 1 tablet (5 mg total) by mouth 2 (two) times daily. 11/10/16   Adrian Saran, MD  budesonide (PULMICORT) 0.5 MG/2ML nebulizer solution Take by nebulization. 11/28/20   [provider]  diazepam (VALIUM) 5 MG tablet Take 0.5 tablets (2.5 mg total) by mouth every 8 (eight) hours as needed for anxiety. 08/10/20   Lurene Shadow, MD  diltiazem (CARDIZEM CD) 120 MG 24 hr capsule Take 120 mg by mouth daily. 04/29/19   [provider]  estradiol (ESTRACE) 1 MG tablet Take 1 mg by mouth daily. 09/10/16   [provider]  furosemide (LASIX) 20 MG tablet Take 20 mg by mouth daily.  10/01/16   [provider]  gabapentin (NEURONTIN) 300 MG capsule Take 300 mg by mouth 3 (three) times daily. 05/06/20   [provider]  ipratropium-albuterol (DUONEB) 0.5-2.5 (3) MG/3ML SOLN Take 3 mLs by nebulization every 6 (six) hours as needed. 08/10/20   Lurene Shadow, MD  lisinopril (ZESTRIL) 10 MG tablet Take 10 mg by mouth daily.    [provider]  MAGNESIUM-OXIDE 400  (241.3 Mg) MG tablet Take 400 mg by mouth daily.  08/24/16   [provider]  methocarbamol (ROBAXIN) 500 MG tablet Take 500 mg by mouth 2 (two) times daily. 12/27/20   [provider]  metoprolol tartrate (LOPRESSOR) 50 MG tablet Take 50 mg by mouth 2 (two) times daily. 04/23/20   [provider]  Multiple Vitamin (MULTIVITAMIN) tablet Take 1 tablet by mouth daily.    [provider]  omeprazole (PRILOSEC) 40 MG capsule Take 40 mg by mouth daily. 04/16/19   [provider]  predniSONE (DELTASONE) 5 MG tablet Take 5 mg by  mouth daily. 12/25/20   [provider]  QUEtiapine (SEROQUEL) 25 MG tablet Take 25 mg by mouth at bedtime.  10/01/16   [provider]  rosuvastatin (CRESTOR) 10 MG tablet Take 10 mg by mouth at bedtime. 10/23/20   [provider]  TRELEGY ELLIPTA 100-62.5-25 MCG/INH AEPB Inhale 1 puff into the lungs daily. 06/04/20   [provider]     Critical care time: 35 minutes    Camie Patience, AGNP  Pulmonary/Critical Care Pager [phone] (please enter 7 digits) PCCM Consult Pager [phone] (please enter 7 digits)

## 2021-01-08 NOTE — Progress Notes (Signed)
Initial Nutrition Assessment  DOCUMENTATION CODES:  Non-severe (moderate) malnutrition in context of chronic illness  INTERVENTION:  Continue current diet as ordered per SLP Ensure Enlive po BID, each supplement provides 350 kcal and 20 grams of protein MVI with minerals daily  NUTRITION DIAGNOSIS:  Moderate Malnutrition related to chronic illness (COPD) as evidenced by mild fat depletion, mild muscle depletion.  GOAL:  Patient will meet greater than or equal to 90% of their needs  MONITOR:  PO intake, Supplement acceptance, I & O's, Labs  REASON FOR ASSESSMENT:  Malnutrition Screening Tool    ASSESSMENT:  70 y.o. female presented to ED from home with respiratory distress. Chronic trach in place but typically is on 5 L oxygen via nasal cannula. PMH of COPD, anemia, A. fib, fibromyalgia, HTN, osteoporosis, and Hep C.  Pt initially requiring ventilator support via trach but was able to be weaned to [facility] this AM and PMV is in place. Noted that pt previously had PEG tube, but it was removed 08/2020. SLP advanced diet this afternoon.   Pt resting in bed at the time of visit, states she didn't eat much lunch, but normally appetite is good at home. States she consumes 2-3 meals each day and occasionally drinks ensure. Muscle and fat deficits present on exam. Agreeable to receive ensure this admission. Pt reports she thinks she is going home tomorrow.      Intake/Output Summary (Last 24 hours) at 01/08/2021 1559 Last data filed at 01/08/2021 1400 Gross per 24 hour  Intake 595.24 ml  Output 250 ml  Net 345.24 ml  Net IO Since Admission: 329.81 mL [01/08/21 1559]  Nutritionally Relevant Medications: Scheduled Meds:  insulin aspart  0-9 Units Subcutaneous Q4H   methylPREDNISolone (SOLU-MEDROL) injection  40 mg Intravenous Q12H   pantoprazole (PROTONIX) IV  40 mg Intravenous QHS   Continuous Infusions:  sodium chloride 75 mL/hr at 01/08/21 0424   dexmedetomidine (PRECEDEX) IV infusion  0.8 mcg/kg/hr (01/08/21 0956)   potassium chloride 10 mEq (01/08/21 0958)   PRN Meds: docusate sodium, polyethylene glycol  Labs Reviewed: K 3.3  NUTRITION - FOCUSED PHYSICAL EXAM: Flowsheet Row Most Recent Value  Orbital Region Moderate depletion  Upper Arm Region No depletion  Thoracic and Lumbar Region Mild depletion  Buccal Region Moderate depletion  Temple Region Mild depletion  Clavicle Bone Region No depletion  Clavicle and Acromion Bone Region Mild depletion  Scapular Bone Region Mild depletion  Dorsal Hand Mild depletion  Patellar Region No depletion  Anterior Thigh Region No depletion  Posterior Calf Region Mild depletion  Edema (RD Assessment) None  Hair Reviewed  Eyes Reviewed  Mouth Reviewed  Skin Reviewed  Nails Reviewed   Diet Order:   Diet Order             DIET DYS 3 Room service appropriate? Yes with Assist; Fluid consistency: Thin  Diet effective now                   EDUCATION NEEDS:  No education needs have been identified at this time  Skin:  Skin Assessment: Reviewed RN Assessment  Last BM:  7/16  Height:  Ht Readings from Last 1 Encounters:  01/06/21 5\' 2"  (1.575 m)   Weight:  Wt Readings from Last 1 Encounters:  01/08/21 56.4 kg    Ideal Body Weight:  50 kg  BMI:  Body mass index is 22.74 kg/m.  Estimated Nutritional Needs:  Kcal:  1400-1600 kcal/d Protein:  70-85g/d Fluid:  >1520mL/d  Ranell Patrick, RD, LDN Clinical Dietitian Pager on [place]

## 2021-01-09 DIAGNOSIS — J9622 Acute and chronic respiratory failure with hypercapnia: Secondary | ICD-10-CM | POA: Diagnosis not present

## 2021-01-09 DIAGNOSIS — J9621 Acute and chronic respiratory failure with hypoxia: Secondary | ICD-10-CM | POA: Diagnosis not present

## 2021-01-09 LAB — CBC WITH DIFFERENTIAL/PLATELET
Abs Immature Granulocytes: 0.06 10*3/uL (ref 0.00–0.07)
Basophils Absolute: 0 10*3/uL (ref 0.0–0.1)
Basophils Relative: 0 %
Eosinophils Absolute: 0 10*3/uL (ref 0.0–0.5)
Eosinophils Relative: 0 %
HCT: 29.4 % — ABNORMAL LOW (ref 36.0–46.0)
Hemoglobin: 9.3 g/dL — ABNORMAL LOW (ref 12.0–15.0)
Immature Granulocytes: 1 %
Lymphocytes Relative: 8 %
Lymphs Abs: 0.9 10*3/uL (ref 0.7–4.0)
MCH: 30.9 pg (ref 26.0–34.0)
MCHC: 31.6 g/dL (ref 30.0–36.0)
MCV: 97.7 fL (ref 80.0–100.0)
Monocytes Absolute: 1 10*3/uL (ref 0.1–1.0)
Monocytes Relative: 9 %
Neutro Abs: 8.8 10*3/uL — ABNORMAL HIGH (ref 1.7–7.7)
Neutrophils Relative %: 82 %
Platelets: 271 10*3/uL (ref 150–400)
RBC: 3.01 MIL/uL — ABNORMAL LOW (ref 3.87–5.11)
RDW: 14.5 % (ref 11.5–15.5)
WBC: 10.7 10*3/uL — ABNORMAL HIGH (ref 4.0–10.5)
nRBC: 0 % (ref 0.0–0.2)

## 2021-01-09 LAB — BASIC METABOLIC PANEL
Anion gap: 10 (ref 5–15)
BUN: 21 mg/dL (ref 8–23)
CO2: 35 mmol/L — ABNORMAL HIGH (ref 22–32)
Calcium: 8.8 mg/dL — ABNORMAL LOW (ref 8.9–10.3)
Chloride: 93 mmol/L — ABNORMAL LOW (ref 98–111)
Creatinine, Ser: 0.65 mg/dL (ref 0.44–1.00)
GFR, Estimated: >60 mL/min (ref >60)
Glucose, Bld: 88 mg/dL (ref 70–99)
Potassium: 3.8 mmol/L (ref 3.5–5.1)
Sodium: 138 mmol/L (ref 135–145)

## 2021-01-09 LAB — GLUCOSE, CAPILLARY
Glucose-Capillary: 96 mg/dL (ref 70–99)
Glucose-Capillary: 72 mg/dL (ref 70–99)
Glucose-Capillary: 68 mg/dL — ABNORMAL LOW (ref 70–99)
Glucose-Capillary: 82 mg/dL (ref 70–99)
Glucose-Capillary: 115 mg/dL — ABNORMAL HIGH (ref 70–99)
Glucose-Capillary: 77 mg/dL (ref 70–99)

## 2021-01-09 LAB — CULTURE, RESPIRATORY W GRAM STAIN: Culture: NORMAL

## 2021-01-09 LAB — MAGNESIUM: Magnesium: 2.1 mg/dL (ref 1.7–2.4)

## 2021-01-09 LAB — PHOSPHORUS: Phosphorus: 3.1 mg/dL (ref 2.5–4.6)

## 2021-01-09 MED ORDER — LISINOPRIL 10 MG PO TABS
10.0000 mg | ORAL_TABLET | Freq: Every day | ORAL | Status: DC
  Administered 2021-01-09 – 2021-01-11 (×3): 10 mg via ORAL
  Filled 2021-01-09: qty 2
  Filled 2021-01-09 (×2): qty 1

## 2021-01-09 MED ORDER — METHOCARBAMOL 500 MG PO TABS
500.0000 mg | ORAL_TABLET | Freq: Two times a day (BID) | ORAL | Status: DC
  Administered 2021-01-09 – 2021-01-11 (×5): 500 mg via ORAL
  Filled 2021-01-09 (×6): qty 1

## 2021-01-09 MED ORDER — IPRATROPIUM-ALBUTEROL 0.5-2.5 (3) MG/3ML IN SOLN
3.0000 mL | Freq: Four times a day (QID) | RESPIRATORY_TRACT | Status: DC
  Administered 2021-01-09 – 2021-01-11 (×9): 3 mL via RESPIRATORY_TRACT
  Filled 2021-01-09 (×9): qty 3

## 2021-01-09 MED ORDER — ALBUTEROL SULFATE (2.5 MG/3ML) 0.083% IN NEBU: 2.5000 mg | INHALATION_SOLUTION | RESPIRATORY_TRACT | Status: DC | PRN

## 2021-01-09 NOTE — Progress Notes (Signed)
   01/09/21 1030  Clinical Encounter Type  Visited With Patient  Visit Type Initial;Spiritual support;Social support  Referral From Chaplain  Consult/Referral To [place] met PT while doing rounds. PT welcomed the chaplain with a friendly smile. PT spoke briefly of her family and place of origin. She seemed to struggle a little with memory. Chaplain ministered with presence and active listening.

## 2021-01-09 NOTE — Progress Notes (Signed)
NAME:  Virginia [name], MRN:  [id number], DOB:  May 08, 1951, LOS: 3 ADMISSION DATE:  01/06/2021, CONSULTATION DATE: 01/06/2021 REFERRING MD: Shela Commons. Cuthriell, PA, CHIEF COMPLAINT:   Respiratory distress  History of Present Illness:  70 year old female who presented to [facility] ED via EMS from home with complaints of respiratory distress. Family called EMS after patient's symptoms of increased work of breathing and dyspnea were not relieved with increased O2 via nasal cannula and subsequently CPAP.  Upon EMS arrival patient was visibly in respiratory distress with SPO2 reading in the 50s.  EMS administered 125 mg of Solu-Medrol. ED course: Patient transitioned from CPAP via EMS to ventilatory support.  Per ED provider report patient had very diminished lung sounds upon arrival, poor ventilation. She received DuoNeb treatments and mag sulfate IV with much improvement in work of breathing and SPO2. Initial vitals: Afebrile at 98.6, tachypnea at 24, ST at 100, BP stable at 115/89 with SPO2 100% on ventilatory support at 35% FiO2. Significant labs: VBG -7.42, 95, < 31, 61.6, Cl 77, serum CO2- 45, BNP- 145.3, mild leukocytosis at 11.4, COVID-19/influenza a&b-negative  PCCM consulted for admission as the patient is requiring ventilatory support via her chronic tracheostomy.  Pertinent  Medical History  Severe COPD on chronic 5 L nasal cannula Chronic tracheostomy Anemia Atrial fibrillation on Eliquis Fibromyalgia Hypertension Hep C Anxiety & depression C. Difficile CAD Thoracic AA Significant Hospital Events: Including procedures, antibiotic start and stop dates in addition to other pertinent events   01/06/2021-admit to ICU with acute on chronic hypoxic /hypercapnic respiratory failure on mechanical ventilation via chronic tracheostomy 01/08/2021-pt tolerating trach collar FiO2 35% with passy muir speaking valve in place.  Speech therapy consulted to assess swallowing for potential diet order   01/09/2021-pt stable on trach collar @35 % will transfer service to [facility]   Interim History / Subjective:  Pt currently tolerating trach collar FiO2 35% with O2 flow rate 8 no s/sx of respiratory distress   Labs reviewed 07/18 BUN/Cr.:  21/0.65 Serum CO2/ AG: 35/10  Hgb: 9.3  WBC/ TMAX: 10.7/ afebrile  CXR 01/06/21: No acute cardiopulmonary disease  Objective   Blood pressure (!) 146/102, pulse 83, temperature 97.7 F (36.5 C), temperature source Oral, resp. rate 20, height 5\' 2"  (1.575 m), weight 58 kg, SpO2 95 %.    FiO2 (%):  [35 %] 35 %   Intake/Output Summary (Last 24 hours) at 01/09/2021 0846 Last data filed at 01/09/2021 0500 Gross per 24 hour  Intake 720.08 ml  Output 450 ml  Net 270.08 ml   Filed Weights   01/06/21 1928 01/08/21 0500 01/09/21 0500  Weight: 57.4 kg 56.4 kg 58 kg    Examination: General: chronically ill appearing female, resting in bed, NAD on trach collar HEENT: MM pink/moist, atraumatic, neck supple, tracheostomy midline with passy muir valve in place  Neuro: Alert and oriented, follows commands, PERRLA CV:  s1s2 RRR, NSR with occasional PVC's, no M/R/G Pulm: diminished with faint rhonchi throughout, even, non labored  GI: soft, rounded, non tender, bs x 4 GU: purewick in place with clear yellow urine Skin: No rashes/lesions, scattered ecchymosis noted Extremities: warm/dry, 2+ radial/2+ distal pulses present, no edema noted  Resolved Hospital Problem list     Assessment & Plan:  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 in the setting of COPD exacerbation  PMHx: Severe COPD Mechanical ventilation as needed for respiratory support, however if tolerating trach collar pt to remain of ventilator VAP bundle implemented  Continue  bronchodilator and nebulized steroids Continue prednisone   Chronic/Paroxsymal Atrial Fibrillation~currently nsr 07/19 Hx: HTN and HLD Continuous telemetry monitoring  Continue outpatient  amlodipine, lisinopril, apixaban, diltiazem, rosuvastatin, and metoprolol   Hyperglycemia secondary to iv steroids-resolved  -CBG's q4hrs  -Will discontinue SSI for now   Fibromyalgia Anxiety & Depression -Continue outpatient scheduled gabapentin, amitriptyline, robaxin, and prn valium   Best Practice (right click and "Reselect all SmartList Selections" daily)  Diet/type: DYS 3  DVT prophylaxis: systemic dose LMWH GI prophylaxis: PPI Lines: N/A Foley:  N/A Code Status:  full code Last date of multidisciplinary goals of care discussion 01/09/21 Labs   CBC: Recent Labs  Lab 01/06/21 1924 01/07/21 0423 01/08/21 0628 01/09/21 0446  WBC 11.4* 6.5 7.0 10.7*  NEUTROABS 9.6*  --   --  8.8*  HGB 10.5* 9.6* 8.9* 9.3*  HCT 34.9* 32.5* 27.8* 29.4*  MCV 103.3* 101.2* 95.2 97.7  PLT 328 225 267 271    Basic Metabolic Panel: Recent Labs  Lab 01/06/21 1924 01/07/21 0423 01/08/21 0628 01/08/21 1619 01/09/21 0446  NA 136 137 136  --  138  K 4.2 5.0 3.3* 4.5 3.8  CL 77* 81* 84*  --  93*  CO2 45* 41* 40*  --  35*  GLUCOSE 111* 124* 173*  --  88  BUN 17 18 23   --  21  CREATININE 0.75 0.83 0.87  --  0.65  CALCIUM 9.2 9.0 8.9  --  8.8*  MG  --  2.4 2.1  --  2.1  PHOS  --  2.2* 3.7  --  3.1   GFR: Estimated Creatinine Clearance: 51.8 mL/min (by C-G formula based on SCr of 0.65 mg/dL). Recent Labs  Lab 01/06/21 1924 01/07/21 0423 01/08/21 0628 01/09/21 0446  PROCALCITON <0.10 <0.10 <0.10  --   WBC 11.4* 6.5 7.0 10.7*    Liver Function Tests: Recent Labs  Lab 01/06/21 1924  AST 27  ALT 21  ALKPHOS 29*  BILITOT 1.0  PROT 7.4  ALBUMIN 4.1   No results for input(s): LIPASE, AMYLASE in the last 168 hours. No results for input(s): AMMONIA in the last 168 hours.  ABG    Component Value Date/Time   PHART 7.50 (H) 01/07/2021 0500   PCO2ART 70 (HH) 01/07/2021 0500   PO2ART <31.0 (LL) 01/07/2021 0500   HCO3 54.6 (H) 01/07/2021 0500   O2SAT 35.7 01/07/2021 0500      Coagulation Profile: No results for input(s): INR, PROTIME in the last 168 hours.  Cardiac Enzymes: No results for input(s): CKTOTAL, CKMB, CKMBINDEX, TROPONINI in the last 168 hours.  HbA1C: Hgb A1c MFr Bld  Date/Time Value Ref Range Status  01/08/2021 06:28 AM 4.9 4.8 - 5.6 % Final    Comment:    (NOTE) Pre diabetes:          5.7%-6.4%  Diabetes:              >6.4%  Glycemic control for   <7.0% adults with diabetes   06/06/2020 12:07 PM 5.3 4.8 - 5.6 % Final    Comment:    (NOTE) Pre diabetes:          5.7%-6.4%  Diabetes:              >6.4%  Glycemic control for   <7.0% adults with diabetes     CBG: Recent Labs  Lab 01/08/21 1651 01/08/21 1928 01/08/21 2317 01/09/21 0320 01/09/21 0720  GLUCAP 139* 104* 106* 96 72  Review of Systems:   No complaints at this time   Past Medical History:  She,  has a past medical history of Anemia, Anxiety, Arthritis, Atrial fibrillation (HCC), C. difficile colitis (09/2015), COPD (chronic obstructive pulmonary disease) (HCC), Depression, Dyspnea, Dysrhythmia, Fibromyalgia, H/O tracheostomy, Hep C w/ coma, chronic, Hypertension, MRSA pneumonia (HCC) (2017), On home oxygen therapy, Osteoporosis, Peripheral neuropathy, RLS (restless legs syndrome), S/P percutaneous endoscopic gastrostomy (PEG) tube placement (HCC) (09/2015), and Ventilator associated pneumonia (HCC) (10/2015).   Surgical History:   Past Surgical History:  Procedure Laterality Date   ABDOMINAL HYSTERECTOMY     BREAST SURGERY Bilateral    Breast Implants   DILATION AND CURETTAGE OF UTERUS     PEG PLACEMENT N/A 06/21/2020   Procedure: PERCUTANEOUS ENDOSCOPIC GASTROSTOMY (PEG) PLACEMENT;  Surgeon: Regis BillLocklear, Cameron T, MD;  Location: [facility] ENDOSCOPY;  Service: Endoscopy;  Laterality: N/A;   REVERSE SHOULDER ARTHROPLASTY Right 03/06/2017   Procedure: REVERSE SHOULDER ARTHROPLASTY;  Surgeon: Christena FlakePoggi, John J, MD;  Location: [facility] ORS;  Service: Orthopedics;   Laterality: Right;   ROTATOR CUFF REPAIR Bilateral    TRACHEOSTOMY TUBE PLACEMENT N/A 06/15/2020   Procedure: TRACHEOSTOMY;  Surgeon: Geanie LoganBennett, Paul, MD;  Location: [facility] ORS;  Service: ENT;  Laterality: N/A;     Social History:   reports that she quit smoking about 5 years ago. Her smoking use included cigarettes. She smoked an average of 1.5 packs per day. She has never used smokeless tobacco. She reports that she does not drink alcohol and does not use drugs.   Family History:  Her family history includes Hypertension in her mother.   Allergies No Known Allergies   Home Medications  Prior to Admission medications   Medication Sig Start Date End Date Taking? Authorizing Provider  albuterol (VENTOLIN HFA) 108 (90 Base) MCG/ACT inhaler Inhale 2 puffs into the lungs every 4 (four) hours as needed for wheezing or shortness of breath.    [provider]  amitriptyline (ELAVIL) 25 MG tablet Take 25 mg by mouth at bedtime.  01/14/19   [provider]  apixaban (ELIQUIS) 5 MG TABS tablet Take 1 tablet (5 mg total) by mouth 2 (two) times daily. 11/10/16   Adrian SaranMody, Sital, MD  budesonide (PULMICORT) 0.5 MG/2ML nebulizer solution Take by nebulization. 11/28/20   [provider]  diazepam (VALIUM) 5 MG tablet Take 0.5 tablets (2.5 mg total) by mouth every 8 (eight) hours as needed for anxiety. 08/10/20   Lurene ShadowAyiku, Bernard, MD  diltiazem (CARDIZEM CD) 120 MG 24 hr capsule Take 120 mg by mouth daily. 04/29/19   [provider]  estradiol (ESTRACE) 1 MG tablet Take 1 mg by mouth daily. 09/10/16   [provider]  furosemide (LASIX) 20 MG tablet Take 20 mg by mouth daily.  10/01/16   [provider]  gabapentin (NEURONTIN) 300 MG capsule Take 300 mg by mouth 3 (three) times daily. 05/06/20   [provider]  ipratropium-albuterol (DUONEB) 0.5-2.5 (3) MG/3ML SOLN Take 3 mLs by nebulization every 6 (six) hours as needed. 08/10/20   Lurene ShadowAyiku, Bernard, MD   lisinopril (ZESTRIL) 10 MG tablet Take 10 mg by mouth daily.    [provider]  MAGNESIUM-OXIDE 400 (241.3 Mg) MG tablet Take 400 mg by mouth daily.  08/24/16   [provider]  methocarbamol (ROBAXIN) 500 MG tablet Take 500 mg by mouth 2 (two) times daily. 12/27/20   [provider]  metoprolol tartrate (LOPRESSOR) 50 MG tablet Take 50 mg by mouth 2 (  two) times daily. 04/23/20   [provider]  Multiple Vitamin (MULTIVITAMIN) tablet Take 1 tablet by mouth daily.    [provider]  omeprazole (PRILOSEC) 40 MG capsule Take 40 mg by mouth daily. 04/16/19   [provider]  predniSONE (DELTASONE) 5 MG tablet Take 5 mg by mouth daily. 12/25/20   [provider]  QUEtiapine (SEROQUEL) 25 MG tablet Take 25 mg by mouth at bedtime.  10/01/16   [provider]  rosuvastatin (CRESTOR) 10 MG tablet Take 10 mg by mouth at bedtime. 10/23/20   [provider]  TRELEGY ELLIPTA 100-62.5-25 MCG/INH AEPB Inhale 1 puff into the lungs daily. 06/04/20   [provider]     Critical care time: 35 minutes    Camie Patience, AGNP  Pulmonary/Critical Care Pager [phone] (please enter 7 digits) PCCM Consult Pager [phone] (please enter 7 digits)

## 2021-01-09 NOTE — Progress Notes (Signed)
Pt remains on trach collar. No sig events to note. Neb tx given as ordered will continue current level of care.

## 2021-01-09 NOTE — Evaluation (Signed)
Physical Therapy Evaluation Patient Details Name: Virginia [name] MRN: [id number] DOB: 05-01-1951 Today's Date: 01/09/2021   History of Present Illness  70 year old female who presented to [facility] ED via EMS from home with complaints of respiratory distress. Family called EMS after patient's symptoms of increased work of breathing and dyspnea were not relieved with increased O2 via nasal cannula and subsequently CPAP.  Upon EMS arrival patient was visibly in respiratory distress with SPO2 reading in the 50s.  Clinical Impression  Pt showed good effort with mobility and was able to confidently do some marching in place and minimal distance ambulation near EOB but ultimately showed good mobility but has very limited activity tolerance.  Pulse ox was inconsistently reading, however it did seem to consistently drop with essentially any activity lasting more than a few seconds.  Pt reports that being quick to fatigue has been getting progressively worse as time goes on.     Follow Up Recommendations Home health PT    Equipment Recommendations  None recommended by PT    Recommendations for Other Services       Precautions / Restrictions Precautions Precautions: Fall Restrictions Weight Bearing Restrictions: No      Mobility  Bed Mobility Overal bed mobility: Modified Independent             General bed mobility comments: Pt was able to get her self up to sitting from supine and back to supine post standing w/o direct assist    Transfers Overall transfer level: Needs assistance Equipment used: Rolling walker (2 wheeled) Transfers: Sit to/from Stand Sit to Stand: Min guard         General transfer comment: Pt able to rise with good relative confidence, reliant on the walker and quick to fatigue in standing but safe with transition w/o direct phyiscal assist  Ambulation/Gait Ambulation/Gait assistance: Min guard Gait Distance (Feet): 5 Feet Assistive device: Rolling walker  (2 wheeled)       General Gait Details: Pt was able to brief bout of a few feet forward/back of ambulation with walker, no LOBs or safety issues but very quick to fatigue.  After 10x marching in place at EOB pt needed to sit with increased DOE and (though inconsistently reading, unsure of veractity) drop in O2 sats to the 70s.  Deferred prolonged gait distance/standing time secondary to decreased activity tolerance at baseline being not much more than today's effort.  Stairs            Wheelchair Mobility    Modified Rankin (Stroke Patients Only)       Balance Overall balance assessment: Modified Independent (Pt with good balance in sitting and standing with walker, but very low tolerace for both)                                           Pertinent Vitals/Pain Pain Assessment: No/denies pain    Home Living Family/patient expects to be discharged to:: Private residence Living Arrangements: Alone Available Help at Discharge: Family;Personal care attendant (Pt has aides QD 3-4 hours, D-in-L typically checks in the the PM) Type of Home: Apartment Home Access: Level entry     Home Layout: One level Home Equipment: Emergency planning/management officer - 4 wheels      Prior Function Level of Independence: Independent with assistive device(s)         Comments: MOD I using RW including  driving, O2 64/3, apparently she has been having more issues with getting out/being active recently due to progressive COPD issues     Hand Dominance        Extremity/Trunk Assessment   Upper Extremity Assessment Upper Extremity Assessment: Generalized weakness    Lower Extremity Assessment Lower Extremity Assessment: Generalized weakness (functional strength, very quick to fatigue with any activity)       Communication   Communication: Passy-Muir valve (removed speaking valve during activity (per pt request) to maximize O2)  Cognition Arousal/Alertness: Awake/alert Behavior  During Therapy: WFL for tasks assessed/performed Overall Cognitive Status: Within Functional Limits for tasks assessed                                        General Comments      Exercises     Assessment/Plan    PT Assessment Patient needs continued PT services  PT Problem List Decreased strength;Decreased activity tolerance;Cardiopulmonary status limiting activity;Decreased balance;Decreased safety awareness;Decreased mobility       PT Treatment Interventions DME instruction;Gait training;Functional mobility training;Therapeutic activities;Therapeutic exercise;Balance training;Neuromuscular re-education;Cognitive remediation;Patient/family education    PT Goals (Current goals can be found in the Care Plan section)  Acute Rehab PT Goals Patient Stated Goal: return home PT Goal Formulation: With patient Time For Goal Achievement: 01/23/21 Potential to Achieve Goals: Good    Frequency Min 2X/week   Barriers to discharge        Co-evaluation               AM-PAC PT "6 Clicks" Mobility  Outcome Measure Help needed turning from your back to your side while in a flat bed without using bedrails?: None Help needed moving from lying on your back to sitting on the side of a flat bed without using bedrails?: None         6 Click Score: 8    End of Session Equipment Utilized During Treatment: Gait belt Activity Tolerance: Patient limited by fatigue Patient left: with call bell/phone within reach;with bed alarm set;with family/visitor present   PT Visit Diagnosis: Muscle weakness (generalized) (M62.81);Difficulty in walking, not elsewhere classified (R26.2)    Time: 3295-1884 PT Time Calculation (min) (ACUTE ONLY): 29 min   Charges:   PT Evaluation $PT Eval Low Complexity: 1 Low PT Treatments $Therapeutic Activity: 8-22 mins        Malachi Pro, DPT 01/09/2021, 3:43 PM

## 2021-01-09 NOTE — Progress Notes (Signed)
Speech Language Pathology Treatment: Dysphagia;Passy Muir Speaking valve  Patient Details Name: Virginia [name] MRN: [id number] DOB: Mar 23, 1951 Today's Date: 01/09/2021 Time: 7579-7282 SLP Time Calculation (min) (ACUTE ONLY): 65 min  Assessment / Plan / Recommendation Clinical Impression  PMV TX; Dysphagia TX.  Pt seen for both PMV and po trials for toleration of diet today. Pt has a Chronic Tracheostomy and has used the PMV at home previously per chart notes from admit to this services 05/2020-07/2020. Pt continues on TC wean today per RT. Pt was alert, talkative during session; suspect possible min Cognitive decline; distracted and required verbal cues for follow through w/ tasks at times.    Explained the use and wear of the PMV to pt; trach and stoma area inspected; Shiley Cuffed trach #6. Pt's respiratory effort appeared min increased w/ any exertion including Talking and Moving to sit upright in bed. RR: 18-24, O2 sats 95-96%, HR 80s. FiO2 35% on 8L per chart. Cuff deflated at baseline. Pt's PMV placed for ~2 hours already this AM, and pt was this well. Pt verbalized indicating her desire to go home soon. Verbalizations and conversation during session were c/b grossly adequate volume and intelligibility w/ fair breath support/effort, though gravely, min strained, gravely vocal quality present. Encouraged pt to focus on slowing down during conversation and using her breath support to support her speech/volume. PMV placement was tolerated during sessions w/out noted O2 desaturation, or significant change in RR/HR from his baseline. No overt discomfort noted in her respiratory effort -- pt stated it felt "fine" to wear/talk w/. No increased effort noted in respirations during the expiratory phase; no overt use of accessary muscles or distress was noted in his breathing pattern. PMV was allowed to remain on as pt consumed po's.   Pt appears to adequately tolerate PMV placement w/out overt, gross  respiratory discomfort or distress; ANS remained adequately stable at her Baseline during wear/use. Suspect Shiley trach size, #6, is beneficial for comfortable PMV wear/use.  Much education was given on PMV use/wear, MUST have Cuff deflation for PMV wear, checking and removing the air from the balloon b/f placing, placing/removing the PMV, and care of the PMV. Discussed that is MUST be worn for all eating/drinking; and can be work w/ Therapies(OT, PT). Must NOT be worn when sleeping -- pt recalled this strategy quickly during session. Encouraged Rest Breaks at times during the day. Precautions posted at bedside and in chart.  Pt remains w/ a Cuffed trach during her admit at this time but NP/MD suspect pt will transition back to a CUFFLESS trach for D/C -- MD will f/u on this. Sticker for PMV precautions and deflation of Cuff placed on Cuff line, and in room. NSG made aware. MD updated. ST services can be available for any further needs while admitted.     DYSPHAGIA TREATMENT:  Pt seen for ongoing assessment of swallowing. She is able to wear the PMV comfortably for verbal communication and now for po intake. See session noted above.  Pt explained general aspiration precautions and agreed verbally to the need for following them especially sitting upright for all oral intake, No Talking w/ food and drink in mouth, and wearing PMV for ALL oral intake. Pt assisted w/ sitting up and using multiple pillows behind her for a forward position. Noted min increased RR rate and effort d/t the moving in bed. After Rest Break, she was then given trials of thin liquids. NO overt clinical s/s of aspiration were noted w/ trials;  No coughing, respiratory status remained relatively calm and overtly unlabored w/ Rest Breaks(as needed), vocal quality clear b/t trials. O2 sats 95%; HR 86; RR low 20s. Pt held Cup when drinking and fed self following instructions for single, small sips slowly given intermittent verbal cues. Straws  were NOT utilized for better oral and Pulmonary control -- does not use at baseline. Oral phase appeared [facility] for bolus management and timely A-P transfer for swallowing. Oral clearing achieved w/ all consistencies given Time. Pt is Missing Bottom Dentition for full, effective mastication.     Recommended continue a Dysphagia level 3 diet (mech soft foods) w/ gravies added to moisten foods; Thin liquids via CUP only. Recommend aspiration precautions; Pills WHOLE in Puree d/t Pulmonary status and for ease of swallowing; tray setup and positioning assistance Upright for meals. PMV MUST BE PLACED FOR ALL ORAL INTAKE. Monitoring w/ all oral intake. ST services can be available for further needs or education on precautions while admitted. NSG, NP, and MD updated on above. Precautions posted at bedside for PMV wear/use and aspiration precautions.      HPI HPI: Pt is a 70 year old female w/ MULTIPLE medical dxs including Chronic Trachostomy,  severe protein malnutrition, fibromyalgia, GERD, mood disorder, declined Pulmonary status w/ COPD who presented to [facility] ED via EMS from home with complaints of respiratory distress. Family called EMS after patient's symptoms of increased work of breathing and dyspnea were not relieved with increased O2 via nasal cannula and subsequently CPAP.  Upon EMS arrival patient was visibly in respiratory distress with SPO2 reading in the 50s.  EMS administered 125 mg of Solu-Medrol.  ED course:  Patient transitioned from CPAP via EMS to ventilatory support.  Per ED provider report patient had very diminished lung sounds upon arrival, poor ventilation. She received DuoNeb treatments and mag sulfate IV with much improvement in work of breathing and SPO2.  Pt is admitted w/ dx of acute on chronic hypoxic and hypercarbic respiratory failure in the setting of COPD exacerbation. At eval, Lungs are clear to auscultation per NP.  CXR: No acute cardiopulmonary disease.  No change from the prior  exam.  OF NOTE: pt had a CUFFED shiley 6 placed in the ED d/t noted vent support initially after admit.      SLP Plan  All goals met       Recommendations  Diet recommendations: Dysphagia 3 (mechanical soft);Thin liquid (moist foods d/t missing lower dentition) Liquids provided via: Cup;No straw Medication Administration: Whole meds with puree (for ease of swallowing) Supervision: Patient able to self feed (setup support) Compensations: Minimize environmental distractions;Slow rate;Small sips/bites;Follow solids with liquid Postural Changes and/or Swallow Maneuvers: Out of bed for meals;Seated upright 90 degrees;Upright 30-60 min after meal      Patient may use Passy-Muir Speech Valve: During all therapies with supervision;During all waking hours (remove during sleep);During PO intake/meals;Caregiver trained to provide supervision PMSV Supervision: Intermittent MD: Please consider changing trach tube to : Cuffless (at Discharge time - not using vent at night)         General recommendations:  (PT/OT following) Oral Care Recommendations: Oral care BID;Oral care before and after PO;Patient independent with oral care Follow up Recommendations: None SLP Visit Diagnosis: Dysphagia, unspecified (R13.10);Aphonia (R49.1) Plan: All goals met       GO                 Orinda Kenner, MS, CCC-SLP Speech Language Pathologist Rehab Services [phone] [facility] 01/09/2021, 1:25 PM

## 2021-01-10 LAB — IRON AND TIBC
Iron: 37 ug/dL (ref 28–170)
Saturation Ratios: 17 % (ref 10.4–31.8)
TIBC: 216 ug/dL — ABNORMAL LOW (ref 250–450)
UIBC: 179 ug/dL

## 2021-01-10 LAB — CBC WITH DIFFERENTIAL/PLATELET
Abs Immature Granulocytes: 0.13 10*3/uL — ABNORMAL HIGH (ref 0.00–0.07)
Basophils Absolute: 0 10*3/uL (ref 0.0–0.1)
Basophils Relative: 0 %
Eosinophils Absolute: 0.1 10*3/uL (ref 0.0–0.5)
Eosinophils Relative: 1 %
HCT: 29.8 % — ABNORMAL LOW (ref 36.0–46.0)
Hemoglobin: 9.4 g/dL — ABNORMAL LOW (ref 12.0–15.0)
Immature Granulocytes: 1 %
Lymphocytes Relative: 13 %
Lymphs Abs: 1.5 10*3/uL (ref 0.7–4.0)
MCH: 30.2 pg (ref 26.0–34.0)
MCHC: 31.5 g/dL (ref 30.0–36.0)
MCV: 95.8 fL (ref 80.0–100.0)
Monocytes Absolute: 0.9 10*3/uL (ref 0.1–1.0)
Monocytes Relative: 8 %
Neutro Abs: 8.3 10*3/uL — ABNORMAL HIGH (ref 1.7–7.7)
Neutrophils Relative %: 77 %
Platelets: 306 10*3/uL (ref 150–400)
RBC: 3.11 MIL/uL — ABNORMAL LOW (ref 3.87–5.11)
RDW: 14.6 % (ref 11.5–15.5)
WBC: 11 10*3/uL — ABNORMAL HIGH (ref 4.0–10.5)
nRBC: 0.3 % — ABNORMAL HIGH (ref 0.0–0.2)

## 2021-01-10 LAB — BASIC METABOLIC PANEL
Anion gap: 8 (ref 5–15)
BUN: 22 mg/dL (ref 8–23)
CO2: 36 mmol/L — ABNORMAL HIGH (ref 22–32)
Calcium: 8.9 mg/dL (ref 8.9–10.3)
Chloride: 96 mmol/L — ABNORMAL LOW (ref 98–111)
Creatinine, Ser: 0.71 mg/dL (ref 0.44–1.00)
GFR, Estimated: >60 mL/min (ref >60)
Glucose, Bld: 90 mg/dL (ref 70–99)
Potassium: 3.9 mmol/L (ref 3.5–5.1)
Sodium: 140 mmol/L (ref 135–145)

## 2021-01-10 LAB — GLUCOSE, CAPILLARY
Glucose-Capillary: 91 mg/dL (ref 70–99)
Glucose-Capillary: 152 mg/dL — ABNORMAL HIGH (ref 70–99)
Glucose-Capillary: 155 mg/dL — ABNORMAL HIGH (ref 70–99)

## 2021-01-10 LAB — VITAMIN D 25 HYDROXY (VIT D DEFICIENCY, FRACTURES): Vit D, 25-Hydroxy: 32.01 ng/mL (ref 30–100)

## 2021-01-10 LAB — VITAMIN B12: Vitamin B-12: 361 pg/mL (ref 180–914)

## 2021-01-10 LAB — MAGNESIUM: Magnesium: 2.2 mg/dL (ref 1.7–2.4)

## 2021-01-10 LAB — FOLATE: Folate: 22 ng/mL (ref >5.9)

## 2021-01-10 LAB — PHOSPHORUS: Phosphorus: 3.3 mg/dL (ref 2.5–4.6)

## 2021-01-10 MED ORDER — VITAMIN B-12 100 MCG PO TABS
100.0000 ug | ORAL_TABLET | Freq: Every day | ORAL | Status: DC
  Administered 2021-01-10 – 2021-01-11 (×2): 100 ug via ORAL
  Filled 2021-01-10 (×2): qty 1

## 2021-01-10 MED ORDER — POLYSACCHARIDE IRON COMPLEX 150 MG PO CAPS
150.0000 mg | ORAL_CAPSULE | Freq: Every day | ORAL | Status: DC
  Administered 2021-01-10 – 2021-01-11 (×2): 150 mg via ORAL
  Filled 2021-01-10 (×2): qty 1

## 2021-01-10 MED ORDER — VITAMIN D (ERGOCALCIFEROL) 1.25 MG (50000 UNIT) PO CAPS
50000.0000 [IU] | ORAL_CAPSULE | ORAL | Status: DC
Start: 2021-01-10 — End: 2021-01-12
  Administered 2021-01-10: 50000 [IU] via ORAL
  Filled 2021-01-10: qty 1

## 2021-01-10 MED ORDER — ACETAMINOPHEN 325 MG PO TABS: 650.0000 mg | ORAL_TABLET | Freq: Four times a day (QID) | ORAL | Status: DC | PRN

## 2021-01-10 MED ORDER — OXYCODONE HCL 5 MG PO TABS
5.0000 mg | ORAL_TABLET | Freq: Four times a day (QID) | ORAL | Status: DC | PRN
  Administered 2021-01-10 – 2021-01-11 (×3): 5 mg via ORAL
  Filled 2021-01-10 (×4): qty 1

## 2021-01-10 NOTE — Progress Notes (Signed)
Transported pt to 149 on trach collar with 35% O2 without incident. Pt settled in room and remains on 35% ATC. RN at bedside. Unit RT aware of pt.

## 2021-01-10 NOTE — Progress Notes (Signed)
[facility] Progress Note  Patient: Virginia [name]    DDU:[id number]  DOA: 01/06/2021     Date of Service: the patient was seen and examined on 01/10/2021  Chief Complaint  Patient presents with   Respiratory Distress   Brief hospital course: 70 year old female who presented to [facility] ED via EMS from home with complaints of respiratory distress. Family called EMS after patient's symptoms of increased work of breathing and dyspnea were not relieved with increased O2 via nasal cannula and subsequently CPAP.  Upon EMS arrival patient was visibly in respiratory distress with SPO2 reading in the 50s.  EMS administered 125 mg of Solu-Medrol. ED course: Patient transitioned from CPAP via EMS to ventilatory support.  Per ED provider report patient had very diminished lung sounds upon arrival, poor ventilation. She received DuoNeb treatments and mag sulfate IV with much improvement in work of breathing and SPO2. Initial vitals: Afebrile at 98.6, tachypnea at 24, ST at 100, BP stable at 115/89 with SPO2 100% on ventilatory support at 35% FiO2. 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 Patient is currently back to her baseline, remains on the trach collar with PMV and cleared for diet.  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 in the setting of COPD exacerbation PMHx: Severe COPD S/p Mechanical ventilation, extubated successfully and downgraded on 01/10/2021  Continue bronchodilator and nebulized steroids Continue prednisone    Chronic/Paroxsymal Atrial Fibrillation~currently nsr 07/19 Hx: HTN and HLD Continuous telemetry monitoring  Continue outpatient  amlodipine, lisinopril, apixaban, diltiazem, rosuvastatin, and metoprolol   Hyperglycemia secondary to iv steroids-resolved  -CBG's q4hrs -Will discontinue SSI for now    Fibromyalgia Anxiety & Depression -Continue outpatient scheduled gabapentin, amitriptyline, robaxin, and prn valium    Vitamin B12 level 361, target >400, started oral supplement Vit D level 32, started oral supplement to prevent deficiency in future Iron level 37, saturation 17%, started oral supplement to prevent deficiency.   Body mass index is 23.39 kg/m.  Nutrition Problem: Moderate Malnutrition Etiology: chronic illness (COPD) Interventions: Interventions: Ensure Enlive (each supplement provides 350kcal and 20 grams of protein), MVI     Diet: Dysphagia 3 DVT Prophylaxis: Therapeutic Anticoagulation with Eliquis    Advance goals of care discussion: Full code  Family Communication: family was Not present at bedside, at the time of interview.  The pt provided permission to discuss medical plan with the family. Opportunity was given to ask question and all questions were answered satisfactorily.   Disposition:  Pt is from ome, admitted with Resp failure, was in the ICU and intubated, successfully extubated and downgraded under hospitalist service on 7/20, and to monitor overnight and discharge tomorrow a.m. Discharge to home with home health, most likely tomorrow a.m if remains stable.  Subjective: No overnight issues, patient was downgraded from ICU last night, patient is feeling improvement, almost back to her baseline.  Denied any difficulty breathing, no palpitations, no chest pain.  Physical Exam: General:  alert oriented to time, place, and person.  Appear in mild distress, affect appropriate Eyes: PERRLA ENT: Oral Mucosa Clear, moist  Neck: no JVD,  Cardiovascular: S1 and S2 Present, no Murmur,  Respiratory: increased respiratory effort, Bilateral Air entry equal and Decreased, mildmild Crackles,  mild wheezes Abdomen: Bowel Sound present, Soft and no tenderness,  Skin: no rashes Extremities: no Pedal edema, no calf tenderness Neurologic: without any new focal findings Gait not checked due to patient safety concerns  Vitals:   01/10/21 0017 01/10/21 0143 01/10/21 0436 01/10/21 0818  BP: 132/88  (!) 156/88 (!) 144/93  Pulse: 68  80 93  Resp: '17  18 20  ' Temp: 98.3 F (36.8 C)  98 F (36.7 C) 97.9 F (36.6 C)  TempSrc:      SpO2: 90% 92% 98% 93%  Weight:      Height:        Intake/Output Summary (Last 24 hours) at 01/10/2021 1502 Last data filed at 01/09/2021 1700 Gross per 24 hour  Intake --  Output 200 ml  Net -200 ml   Filed Weights   01/06/21 1928 01/08/21 0500 01/09/21 0500  Weight: 57.4 kg 56.4 kg 58 kg    Data Reviewed: I have personally reviewed and interpreted daily labs, tele strips, imagings as discussed above. I reviewed all nursing notes, pharmacy notes, vitals, pertinent old records I have discussed plan of care as described above with RN and patient/family.  CBC: Recent Labs  Lab 01/06/21 1924 01/07/21 0423 01/08/21 0628 01/09/21 0446 01/10/21 0346  WBC 11.4* 6.5 7.0 10.7* 11.0*  NEUTROABS 9.6*  --   --  8.8* 8.3*  HGB 10.5* 9.6* 8.9* 9.3* 9.4*  HCT 34.9* 32.5* 27.8* 29.4* 29.8*  MCV 103.3* 101.2* 95.2 97.7 95.8  PLT 328 225 267 271 742   Basic Metabolic Panel: Recent Labs  Lab 01/06/21 1924 01/07/21 0423 01/08/21 0628 01/08/21 1619 01/09/21 0446 01/10/21 0346  NA 136 137 136  --  138 140  K 4.2 5.0 3.3* 4.5 3.8 3.9  CL 77* 81* 84*  --  93* 96*  CO2 45* 41* 40*  --  35* 36*  GLUCOSE 111* 124* 173*  --  88 90  BUN '17 18 23  ' --  21 22  CREATININE 0.75 0.83 0.87  --  0.65 0.71  CALCIUM 9.2 9.0 8.9  --  8.8* 8.9  MG  --  2.4 2.1  --  2.1 2.2  PHOS  --  2.2* 3.7  --  3.1 3.3    Studies: No results found.  Scheduled Meds:  amitriptyline  25 mg Oral QHS   apixaban  5 mg Oral BID   arformoterol  15 mcg Nebulization BID    budesonide (PULMICORT) nebulizer solution  0.25 mg Nebulization BID   chlorhexidine gluconate (MEDLINE KIT)  15 mL Mouth Rinse BID   Chlorhexidine Gluconate Cloth  6 each Topical Daily   diltiazem  120 mg Oral Daily   estradiol  1 mg Oral Daily   feeding supplement  237 mL Oral BID BM   gabapentin  300 mg Oral TID   ipratropium-albuterol  3 mL Nebulization Q6H   lisinopril  10 mg Oral Daily   mouth rinse  15 mL Mouth Rinse 10 times per day   methocarbamol  500 mg Oral BID   metoprolol tartrate  50 mg Oral BID   multivitamin with minerals  1 tablet Oral Daily   mupirocin ointment   Nasal BID   pantoprazole  40 mg Oral Daily   [START ON 01/11/2021] predniSONE  5 mg Oral Q breakfast   QUEtiapine  25 mg Oral QHS   rosuvastatin  10 mg Oral QHS   Continuous Infusions: PRN Meds: acetaminophen, albuterol, diazepam, docusate sodium, oxyCODONE, polyethylene  glycol  Time spent: 35 minutes  Author: Val Riles. MD [facility] 01/10/2021 3:02 PM  To reach On-call, see care teams to locate the attending and reach out to them via [email or web address]. If 7PM-7AM, please contact night-coverage If you still have difficulty reaching the attending provider, please page the [facility] (Director on Call) for [facility] on amion for assistance.

## 2021-01-10 NOTE — Progress Notes (Signed)
Met with the patient at the bedside to discuss DC plan and needs She lives at home in an apartment alone, has a chronic Trach She has a Breathing machine, Oxygen, [place] machine, CPAP, Rollator and shower seat at home She has Home health services as well as a PCA that is there daily, they are working on getting more hours approved, She stated that she does not have any needs at this time, she reports being non complaint but stated that she knows she has to do better. TOC CM will continue to monitor for needs

## 2021-01-10 NOTE — Care Management Important Message (Signed)
Important Message  Patient Details  Name: Virginia [name] MRN: [id number] Date of Birth: Mar 20, 1951   Medicare Important Message Given:  Yes     [name] A Copper Basnett 01/10/2021, 2:41 PM

## 2021-01-11 LAB — CBC
HCT: 28.9 % — ABNORMAL LOW (ref 36.0–46.0)
Hemoglobin: 9 g/dL — ABNORMAL LOW (ref 12.0–15.0)
MCH: 30.6 pg (ref 26.0–34.0)
MCHC: 31.1 g/dL (ref 30.0–36.0)
MCV: 98.3 fL (ref 80.0–100.0)
Platelets: 281 10*3/uL (ref 150–400)
RBC: 2.94 MIL/uL — ABNORMAL LOW (ref 3.87–5.11)
RDW: 14.8 % (ref 11.5–15.5)
WBC: 9.6 10*3/uL (ref 4.0–10.5)
nRBC: 0 % (ref 0.0–0.2)

## 2021-01-11 LAB — BASIC METABOLIC PANEL
Anion gap: 9 (ref 5–15)
BUN: 25 mg/dL — ABNORMAL HIGH (ref 8–23)
CO2: 34 mmol/L — ABNORMAL HIGH (ref 22–32)
Calcium: 9 mg/dL (ref 8.9–10.3)
Chloride: 97 mmol/L — ABNORMAL LOW (ref 98–111)
Creatinine, Ser: 0.7 mg/dL (ref 0.44–1.00)
GFR, Estimated: >60 mL/min (ref >60)
Glucose, Bld: 104 mg/dL — ABNORMAL HIGH (ref 70–99)
Potassium: 3.8 mmol/L (ref 3.5–5.1)
Sodium: 140 mmol/L (ref 135–145)

## 2021-01-11 LAB — GLUCOSE, CAPILLARY
Glucose-Capillary: 107 mg/dL — ABNORMAL HIGH (ref 70–99)
Glucose-Capillary: 123 mg/dL — ABNORMAL HIGH (ref 70–99)
Glucose-Capillary: 130 mg/dL — ABNORMAL HIGH (ref 70–99)
Glucose-Capillary: 93 mg/dL (ref 70–99)
Glucose-Capillary: 96 mg/dL (ref 70–99)

## 2021-01-11 LAB — MAGNESIUM: Magnesium: 2.1 mg/dL (ref 1.7–2.4)

## 2021-01-11 LAB — PHOSPHORUS: Phosphorus: 4.8 mg/dL — ABNORMAL HIGH (ref 2.5–4.6)

## 2021-01-11 MED ORDER — CYANOCOBALAMIN 100 MCG PO TABS: 100.0000 ug | ORAL_TABLET | Freq: Every day | ORAL | 2 refills | Status: DC

## 2021-01-11 MED ORDER — POLYSACCHARIDE IRON COMPLEX 150 MG PO CAPS: 150.0000 mg | ORAL_CAPSULE | Freq: Every day | ORAL | 2 refills | Status: DC

## 2021-01-11 MED ORDER — VITAMIN D (ERGOCALCIFEROL) 1.25 MG (50000 UNIT) PO CAPS: 50000.0000 [IU] | ORAL_CAPSULE | ORAL | 0 refills | Status: DC

## 2021-01-11 NOTE — TOC Progression Note (Signed)
Transition of Care [facility]) - Progression Note    Patient Details  Name: Virginia [name] MRN: [id number] Date of Birth: Oct 11, 1950  Transition of Care [facility]) CM/SW [place], RN Phone Number: 01/11/2021, 9:18 AM  Clinical Narrative:     Met with the patient at the bedside to verify which Home health agency she is set up with, She stated that it is [facility] and she wants to continue with them, Her Daughter in law provides transportation and will be available at DC to take her home  Expected Discharge Plan: [place] Barriers to Discharge: Barriers Resolved  Expected Discharge Plan and Services Expected Discharge Plan: [place]   Discharge Planning Services: CM Consult   Living arrangements for the past 2 months: Apartment                 DME Arranged: N/A                     Social Determinants of Health (SDOH) Interventions    Readmission Risk Interventions No flowsheet data found.

## 2021-01-11 NOTE — Discharge Summary (Signed)
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 in the setting of COPD exacerbation Active Problems:   AF (paroxysmal atrial fibrillation) (HCC)   Depression   Fibromyalgia   Hypertension   Acute on chronic respiratory failure (HCC)   On mechanically assisted ventilation (HCC)   Hyperlipidemia   Anxiety   Malnutrition of moderate degree   Admitted From: Home Disposition:  Home with HHPT  Recommendations for Outpatient Follow-up:  PCP: in 1 wk Pulmo in 1 wk Follow up LABS/TEST:     Diet recommendation: Cardiac diet  Activity: The patient is advised to gradually reintroduce usual activities, as tolerated  Discharge Condition: stable  Code Status: Full code   History of present illness: As per the H and P dictated on admission 70 year old female who [facility] ED via EMS from home with complaints of respiratory distress. Family called EMS after patient's symptoms of increased work of breathing and dyspnea were not relieved with increased O2 via nasal cannula and subsequently CPAP.  Upon EMS arrival patient was visibly in respiratory distress with SPO2 reading in the 50s.  EMS administered 125 mg of Solu-Medrol. ED course: Patient transitioned from CPAP via EMS to ventilatory support.  Per ED provider report patient had very diminished lung sounds upon arrival, poor ventilation. She received DuoNeb treatments and mag sulfate IV with much improvement in work of breathing and SPO2. Initial vitals: Afebrile at 98.6, tachypnea at 24, ST at 100, BP stable at 115/89 with SPO2 100% on ventilatory support at 35% FiO2. 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 Patient is currently back to her baseline, remains on the trach collar with PMV and cleared for diet.  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 daily home dose. Chronic/Paroxsymal Atrial Fibrillation~currently nsr 07/19 Hx: HTN and HLD. Continue outpatient lisinopril, apixaban, diltiazem, rosuvastatin, and metoprolol Hyperglycemia secondary to iv steroids-resolved,   Fibromyalgia, Anxiety & Depression. Continue outpatient scheduled gabapentin, amitriptyline, robaxin, and prn valium  Vitamin B12 level 361, target >400, started oral supplement Vit D level 32, started oral supplement to prevent deficiency in future Iron level 37, saturation 17%, started oral supplement to prevent deficiency. Body mass index is 23.39 kg/m.  Nutrition Problem: Moderate Malnutrition Etiology: chronic illness (COPD) Nutrition Interventions: Interventions: Ensure Enlive (each supplement provides 350kcal and 20 grams of protein), MVI     Patient was seen by physical therapy, who recommended Home health, which was arranged. On the day of the discharge the patient's vitals were stable, and no other acute medical condition were reported by patient. the patient was felt safe to be discharge at Home with Home health.  [facility] Procedures: intubation   Discharge Exam: General: Appear in no distress, no Rash; Oral Mucosa Clear, moist. Cardiovascular: S1 and S2 Present, no Murmur, Respiratory: normal respiratory effort, Bilateral Air entry present  and no  Crackles, no wheezes Abdomen: Bowel Sound present, Soft and no tenderness, no hernia Extremities: no Pedal edema, no calf tenderness Neurology: alert and oriented to time, place, and person affect appropriate.  Filed Weights   01/06/21 1928 01/08/21 0500 01/09/21 0500  Weight: 57.4 kg 56.4 kg 58 kg   Vitals:   01/11/21 0747 01/11/21 1115  BP: (!) 148/88 131/81  Pulse: 81 94  Resp: 16 18  Temp: 98.4 F (36.9 C) 97.7 F (36.5 C)  SpO2: 97% (!) 62%    DISCHARGE MEDICATION: Allergies as of 01/11/2021   No Known Allergies      Medication List     TAKE these medications    albuterol 108 (90 Base) MCG/ACT inhaler Commonly known as: VENTOLIN HFA Inhale 2 puffs into the lungs every 4 (four) hours as needed for wheezing or shortness of breath.   amitriptyline 25 MG tablet Commonly known as: ELAVIL Take 25 mg by mouth at bedtime.   apixaban 5 MG Tabs tablet Commonly known as: Eliquis Take 1 tablet (5 mg total) by mouth 2 (two) times daily.   budesonide 0.5 MG/2ML nebulizer solution Commonly known as: PULMICORT Take by nebulization.   cyanocobalamin 100 MCG tablet Take 1 tablet (100 mcg total) by mouth daily. Start taking on: January 12, 2021   diazepam 5 MG tablet Commonly known as: VALIUM Take 0.5 tablets (2.5 mg total) by mouth every 8 (eight) hours as needed for anxiety.   diltiazem 120 MG 24 hr capsule Commonly known as: CARDIZEM CD Take 120 mg by mouth daily.   estradiol 1 MG tablet Commonly known as: ESTRACE Take 1 mg by mouth daily.   furosemide 20 MG tablet Commonly known as: LASIX Take 20 mg by mouth daily.   gabapentin 300 MG capsule Commonly known as: NEURONTIN Take 300 mg by mouth 3 (three) times daily.   ipratropium-albuterol 0.5-2.5 (3) MG/3ML Soln Commonly known as: DUONEB Take 3 mLs by nebulization every 6 (six) hours as needed.   iron polysaccharides 150 MG capsule Commonly known as: NIFEREX Take 1 capsule (150 mg total) by mouth  daily. Start taking on: January 12, 2021   lisinopril 10 MG tablet Commonly known as: ZESTRIL Take 10 mg by mouth daily.   MAGnesium-Oxide 400 (241.3 Mg) MG tablet Generic drug: magnesium oxide Take 400 mg by mouth daily.   methocarbamol 500 MG tablet Commonly known as: ROBAXIN Take 500 mg by mouth 2 (two) times daily.   metoprolol tartrate 50 MG tablet Commonly known as: LOPRESSOR Take 50 mg by mouth 2 (two) times daily.   multivitamin tablet Take 1 tablet by mouth daily.   omeprazole 40 MG capsule Commonly known as: PRILOSEC Take 40 mg by mouth daily.   predniSONE 5 MG tablet Commonly known as: DELTASONE Take 5 mg by mouth daily.   QUEtiapine 25 MG tablet Commonly known as: SEROQUEL Take 25 mg by mouth at bedtime.   rosuvastatin 10 MG tablet Commonly known as: CRESTOR Take 10 mg by mouth at bedtime.   Trelegy Ellipta 100-62.5-25 MCG/INH Aepb Generic drug: Fluticasone-Umeclidin-Vilant Inhale 1 puff into the lungs daily.   Vitamin D (Ergocalciferol) 1.25 MG (50000 UNIT) Caps capsule Commonly known as: DRISDOL Take 1 capsule (50,000 Units total) by mouth every 7 (seven) days. Start taking on: January 17, 2021               Discharge Care Instructions  (From admission, onward)  Start     Ordered   01/11/21 0000  Discharge wound care:       Comments: As per wound care RN   01/11/21 1132           No Known Allergies Discharge Instructions     Call MD for:  difficulty breathing, headache or visual disturbances   Complete by: As directed    Call MD for:  extreme fatigue   Complete by: As directed    Call MD for:  severe uncontrolled pain   Complete by: As directed    Call MD for:  temperature >100.4   Complete by: As directed    Diet - low sodium heart healthy   Complete by: As directed    Discharge instructions   Complete by: As directed    Follow-up with PCP in 1 week Follow-up with pulmonologist in 1 week   Discharge wound care:    Complete by: As directed    As per wound care RN   Increase activity slowly   Complete by: As directed        The results of significant diagnostics from this hospitalization (including imaging, microbiology, ancillary and laboratory) are listed below for reference.    Significant Diagnostic Studies: DG Chest Portable 1 View  Result Date: 01/06/2021 CLINICAL DATA:  Pt has trach and hx of COPD. Ems reports that pt is normally on 8lpm via [place] at home. Pt also has at home CPAP that was not effective. EXAM: PORTABLE CHEST 1 VIEW COMPARISON:  08/08/2020 and older exams. FINDINGS: Cardiac silhouette is normal in size. No mediastinal or hilar masses. Tracheostomy tube is stable, well positioned. Lungs show prominent interstitial markings bilaterally which are unchanged. No lung consolidation or evidence of edema. No convincing pleural effusion or pneumothorax. Partly imaged reverse right shoulder prosthesis is stable. IMPRESSION: 1. No acute cardiopulmonary disease.  No change from the prior exam. Electronically Signed   By: Amie Portland M.D.   On: 01/06/2021 19:46    Microbiology: Recent Results (from the past 240 hour(s))  Resp Panel by RT-PCR (Flu A&B, Covid) Nasopharyngeal Swab     Status: None   Collection Time: 01/06/21  7:24 PM   Specimen: Nasopharyngeal Swab; Nasopharyngeal(NP) swabs in vial transport medium  Result Value Ref Range Status   SARS Coronavirus 2 by RT PCR NEGATIVE NEGATIVE Final    Comment: (NOTE) SARS-CoV-2 target nucleic acids are NOT DETECTED.  The SARS-CoV-2 RNA is generally detectable in upper respiratory specimens during the acute phase of infection. The lowest concentration of SARS-CoV-2 viral copies this assay can detect is 138 copies/mL. A negative result does not preclude SARS-Cov-2 infection and should not be used as the sole basis for treatment or other patient management decisions. A negative result may occur with  improper specimen collection/handling,  submission of specimen other than nasopharyngeal swab, presence of viral mutation(s) within the areas targeted by this assay, and inadequate number of viral copies(<138 copies/mL). A negative result must be combined with clinical observations, patient history, and epidemiological information. The expected result is Negative.  Fact Sheet for Patients:  BloggerCourse.com  Fact Sheet for Healthcare Providers:  SeriousBroker.it  This test is no t yet approved or cleared by the [place] FDA and  has been authorized for detection and/or diagnosis of SARS-CoV-2 by FDA under an Emergency Use Authorization (EUA). This EUA will remain  in effect (meaning this test can be used) for the duration of the COVID-19 declaration under Section 564(b)(1) of  the Act, 21 U.S.C.section 360bbb-3(b)(1), unless the authorization is terminated  or revoked sooner.       Influenza A by PCR NEGATIVE NEGATIVE Final   Influenza B by PCR NEGATIVE NEGATIVE Final    Comment: (NOTE) The Xpert Xpress SARS-CoV-2/FLU/RSV plus assay is intended as an aid in the diagnosis of influenza from Nasopharyngeal swab specimens and should not be used as a sole basis for treatment. Nasal washings and aspirates are unacceptable for Xpert Xpress SARS-CoV-2/FLU/RSV testing.  Fact Sheet for Patients: BloggerCourse.com  Fact Sheet for Healthcare Providers: SeriousBroker.it  This test is not yet approved or cleared by the [place] FDA and has been authorized for detection and/or diagnosis of SARS-CoV-2 by FDA under an Emergency Use Authorization (EUA). This EUA will remain in effect (meaning this test can be used) for the duration of the COVID-19 declaration under Section 564(b)(1) of the Act, 21 U.S.C. section 360bbb-3(b)(1), unless the authorization is terminated or revoked.  Performed at [facility], [address] [place], [place] [address]   Culture, Respiratory w Gram Stain     Status: None   Collection Time: 01/06/21 11:35 PM   Specimen: Tracheal Aspirate; Respiratory  Result Value Ref Range Status   Specimen Description   Final    EXPECTORATED SPUTUM Performed at [facility], [address] [place], [place] [address]    Special Requests   Final    NONE Performed at [facility], [address] [place], [place] [address]    Gram Stain   Final    RARE WBC PRESENT,BOTH PMN AND MONONUCLEAR FEW GRAM POSITIVE RODS RARE GRAM POSITIVE COCCI IN CLUSTERS    Culture   Final    FEW Normal respiratory flora-no Staph aureus or Pseudomonas seen Performed at [facility], [address] [place], [place] [address]    Report Status 01/09/2021 FINAL  Final  MRSA Next Gen by PCR, Nasal     Status: Abnormal   Collection Time: 01/07/21  8:26 AM   Specimen: Nasal Mucosa; Nasal Swab  Result Value Ref Range Status   MRSA by PCR Next Gen DETECTED (A) NOT DETECTED Final    Comment: RESULT CALLED TO, READ BACK BY AND VERIFIED WITH: Fabian Sharp AT 1028 01/07/21.PMF (NOTE) The GeneXpert MRSA Assay (FDA approved for NASAL specimens only), is one component of a comprehensive MRSA colonization surveillance program. It is not intended to diagnose MRSA infection nor to guide or monitor treatment for MRSA infections. Test performance is not FDA approved in patients less than 97 years old. Performed at [facility], [address] [place], [place] [address]      Labs: CBC: Recent Labs  Lab 01/06/21 1924 01/07/21 0423 01/08/21 4401 01/09/21 0446 01/10/21 0346 01/11/21 0453  WBC 11.4* 6.5 7.0 10.7* 11.0* 9.6  NEUTROABS 9.6*  --   --  8.8* 8.3*  --   HGB 10.5* 9.6* 8.9* 9.3* 9.4* 9.0*  HCT 34.9* 32.5* 27.8* 29.4* 29.8* 28.9*  MCV 103.3* 101.2* 95.2 97.7 95.8 98.3  PLT 328 225 267 271 306 281   Basic Metabolic Panel: Recent Labs  Lab  01/07/21 0423 01/08/21 0628 01/08/21 1619 01/09/21 0446 01/10/21 0346 01/11/21 0453  NA 137 136  --  138 140 140  K 5.0 3.3* 4.5 3.8 3.9 3.8  CL 81* 84*  --  93* 96* 97*  CO2 41* 40*  --  35* 36* 34*  GLUCOSE 124* 173*  --  88 90 104*  BUN 18 23  --  21 22 25*  CREATININE 0.83 0.87  --  0.65 0.71 0.70  CALCIUM 9.0 8.9  --  8.8* 8.9 9.0  MG 2.4 2.1  --  2.1 2.2 2.1  PHOS 2.2* 3.7  --  3.1 3.3 4.8*   Liver Function Tests: Recent Labs  Lab 01/06/21 1924  AST 27  ALT 21  ALKPHOS 29*  BILITOT 1.0  PROT 7.4  ALBUMIN 4.1   No results for input(s): LIPASE, AMYLASE in the last 168 hours. No results for input(s): AMMONIA in the last 168 hours. Cardiac Enzymes: No results for input(s): CKTOTAL, CKMB, CKMBINDEX, TROPONINI in the last 168 hours. BNP (last 3 results) Recent Labs    07/07/20 0855 07/13/20 1507 01/06/21 1924  BNP 88.4 145.7* 145.3*   CBG: Recent Labs  Lab 01/10/21 2118 01/11/21 0005 01/11/21 0444 01/11/21 0748 01/11/21 1112  GLUCAP 155* 130* 107* 93 123*    Time spent: 35 minutes  Signed:  Gillis Santa  [facility]  01/11/2021 11:33 AM

## 2021-01-11 NOTE — Plan of Care (Signed)
Patient discharged home per MD orders at this time.All discharge instructions,education and medications reviewed with Pt and daughter-in-law at bedside.Respiratory Therapist was also available in the room to attend to family inquiries about tracheostomy concerns. Pt expressed understanding and will comply with dc instructions.follow up appointments also communicated to patient.no verbal c/o or any ssx of distress at this time.Patient was transported home by daughter-in-law in a private car.

## 2021-02-18 ENCOUNTER — Emergency Department: Payer: Medicare Other

## 2021-02-18 ENCOUNTER — Encounter: Payer: Self-pay | Admitting: Radiology

## 2021-02-18 ENCOUNTER — Inpatient Hospital Stay: Payer: Medicare Other

## 2021-02-18 ENCOUNTER — Inpatient Hospital Stay
Admission: EM | Admit: 2021-02-18 | Discharge: 2021-03-12 | DRG: 207 | Disposition: A | Discharge status: Home Health | Payer: Medicare Other | Attending: Internal Medicine | Admitting: Internal Medicine

## 2021-02-18 DIAGNOSIS — J441 Chronic obstructive pulmonary disease with (acute) exacerbation: Secondary | ICD-10-CM | POA: Diagnosis present

## 2021-02-18 DIAGNOSIS — G928 Other toxic encephalopathy: Secondary | ICD-10-CM | POA: Diagnosis present

## 2021-02-18 DIAGNOSIS — I248 Other forms of acute ischemic heart disease: Secondary | ICD-10-CM | POA: Diagnosis present

## 2021-02-18 DIAGNOSIS — F419 Anxiety disorder, unspecified: Secondary | ICD-10-CM | POA: Diagnosis present

## 2021-02-18 DIAGNOSIS — I110 Hypertensive heart disease with heart failure: Secondary | ICD-10-CM | POA: Diagnosis present

## 2021-02-18 DIAGNOSIS — R0602 Shortness of breath: Secondary | ICD-10-CM

## 2021-02-18 DIAGNOSIS — Z20822 Contact with and (suspected) exposure to covid-19: Secondary | ICD-10-CM | POA: Diagnosis present

## 2021-02-18 DIAGNOSIS — R140 Abdominal distension (gaseous): Secondary | ICD-10-CM

## 2021-02-18 DIAGNOSIS — R296 Repeated falls: Secondary | ICD-10-CM | POA: Diagnosis present

## 2021-02-18 DIAGNOSIS — J9622 Acute and chronic respiratory failure with hypercapnia: Secondary | ICD-10-CM | POA: Diagnosis present

## 2021-02-18 DIAGNOSIS — R0902 Hypoxemia: Secondary | ICD-10-CM

## 2021-02-18 DIAGNOSIS — J189 Pneumonia, unspecified organism: Secondary | ICD-10-CM | POA: Diagnosis present

## 2021-02-18 DIAGNOSIS — Z930 Tracheostomy status: Secondary | ICD-10-CM

## 2021-02-18 DIAGNOSIS — Z7901 Long term (current) use of anticoagulants: Secondary | ICD-10-CM

## 2021-02-18 DIAGNOSIS — I2510 Atherosclerotic heart disease of native coronary artery without angina pectoris: Secondary | ICD-10-CM | POA: Diagnosis present

## 2021-02-18 DIAGNOSIS — J440 Chronic obstructive pulmonary disease with acute lower respiratory infection: Secondary | ICD-10-CM | POA: Diagnosis present

## 2021-02-18 DIAGNOSIS — Z79899 Other long term (current) drug therapy: Secondary | ICD-10-CM

## 2021-02-18 DIAGNOSIS — B182 Chronic viral hepatitis C: Secondary | ICD-10-CM | POA: Diagnosis present

## 2021-02-18 DIAGNOSIS — M810 Age-related osteoporosis without current pathological fracture: Secondary | ICD-10-CM | POA: Diagnosis present

## 2021-02-18 DIAGNOSIS — E875 Hyperkalemia: Secondary | ICD-10-CM | POA: Diagnosis present

## 2021-02-18 DIAGNOSIS — Z96611 Presence of right artificial shoulder joint: Secondary | ICD-10-CM | POA: Diagnosis present

## 2021-02-18 DIAGNOSIS — D638 Anemia in other chronic diseases classified elsewhere: Secondary | ICD-10-CM | POA: Diagnosis not present

## 2021-02-18 DIAGNOSIS — E440 Moderate protein-calorie malnutrition: Secondary | ICD-10-CM | POA: Diagnosis present

## 2021-02-18 DIAGNOSIS — Z7189 Other specified counseling: Secondary | ICD-10-CM | POA: Diagnosis not present

## 2021-02-18 DIAGNOSIS — I712 Thoracic aortic aneurysm, without rupture: Secondary | ICD-10-CM | POA: Diagnosis present

## 2021-02-18 DIAGNOSIS — K567 Ileus, unspecified: Secondary | ICD-10-CM | POA: Diagnosis not present

## 2021-02-18 DIAGNOSIS — Z9911 Dependence on respirator [ventilator] status: Secondary | ICD-10-CM | POA: Diagnosis not present

## 2021-02-18 DIAGNOSIS — G629 Polyneuropathy, unspecified: Secondary | ICD-10-CM | POA: Diagnosis present

## 2021-02-18 DIAGNOSIS — F32A Depression, unspecified: Secondary | ICD-10-CM | POA: Diagnosis present

## 2021-02-18 DIAGNOSIS — R64 Cachexia: Secondary | ICD-10-CM | POA: Diagnosis present

## 2021-02-18 DIAGNOSIS — Z7951 Long term (current) use of inhaled steroids: Secondary | ICD-10-CM

## 2021-02-18 DIAGNOSIS — Z6822 Body mass index (BMI) 22.0-22.9, adult: Secondary | ICD-10-CM

## 2021-02-18 DIAGNOSIS — M797 Fibromyalgia: Secondary | ICD-10-CM | POA: Diagnosis present

## 2021-02-18 DIAGNOSIS — J9600 Acute respiratory failure, unspecified whether with hypoxia or hypercapnia: Secondary | ICD-10-CM | POA: Diagnosis present

## 2021-02-18 DIAGNOSIS — Z8249 Family history of ischemic heart disease and other diseases of the circulatory system: Secondary | ICD-10-CM

## 2021-02-18 DIAGNOSIS — I480 Paroxysmal atrial fibrillation: Secondary | ICD-10-CM | POA: Diagnosis not present

## 2021-02-18 DIAGNOSIS — E785 Hyperlipidemia, unspecified: Secondary | ICD-10-CM | POA: Diagnosis present

## 2021-02-18 DIAGNOSIS — I5021 Acute systolic (congestive) heart failure: Secondary | ICD-10-CM | POA: Diagnosis present

## 2021-02-18 DIAGNOSIS — Z87891 Personal history of nicotine dependence: Secondary | ICD-10-CM

## 2021-02-18 DIAGNOSIS — G9341 Metabolic encephalopathy: Secondary | ICD-10-CM | POA: Diagnosis present

## 2021-02-18 DIAGNOSIS — J9621 Acute and chronic respiratory failure with hypoxia: Principal | ICD-10-CM | POA: Diagnosis present

## 2021-02-18 DIAGNOSIS — I471 Supraventricular tachycardia, unspecified: Secondary | ICD-10-CM

## 2021-02-18 DIAGNOSIS — J1569 Pneumonia due to other gram-negative bacteria: Secondary | ICD-10-CM

## 2021-02-18 DIAGNOSIS — J156 Pneumonia due to other aerobic Gram-negative bacteria: Secondary | ICD-10-CM

## 2021-02-18 DIAGNOSIS — Z79818 Long term (current) use of other agents affecting estrogen receptors and estrogen levels: Secondary | ICD-10-CM

## 2021-02-18 DIAGNOSIS — G2581 Restless legs syndrome: Secondary | ICD-10-CM | POA: Diagnosis present

## 2021-02-18 LAB — RESP PANEL BY RT-PCR (FLU A&B, COVID) ARPGX2
Influenza A by PCR: NEGATIVE
Influenza B by PCR: NEGATIVE
SARS Coronavirus 2 by RT PCR: NEGATIVE

## 2021-02-18 LAB — COMPREHENSIVE METABOLIC PANEL
ALT: 26 U/L (ref 0–44)
AST: 47 U/L — ABNORMAL HIGH (ref 15–41)
Albumin: 3.9 g/dL (ref 3.5–5.0)
Alkaline Phosphatase: 22 U/L — ABNORMAL LOW (ref 38–126)
Anion gap: 10 (ref 5–15)
BUN: 15 mg/dL (ref 8–23)
CO2: 43 mmol/L — ABNORMAL HIGH (ref 22–32)
Calcium: 9 mg/dL (ref 8.9–10.3)
Chloride: 90 mmol/L — ABNORMAL LOW (ref 98–111)
Creatinine, Ser: 0.96 mg/dL (ref 0.44–1.00)
GFR, Estimated: >60 mL/min (ref >60)
Glucose, Bld: 126 mg/dL — ABNORMAL HIGH (ref 70–99)
Potassium: 5.1 mmol/L (ref 3.5–5.1)
Sodium: 143 mmol/L (ref 135–145)
Total Bilirubin: 1.4 mg/dL — ABNORMAL HIGH (ref 0.3–1.2)
Total Protein: 6.7 g/dL (ref 6.5–8.1)

## 2021-02-18 LAB — CBC
HCT: 30 % — ABNORMAL LOW (ref 36.0–46.0)
Hemoglobin: 8.4 g/dL — ABNORMAL LOW (ref 12.0–15.0)
MCH: 30.3 pg (ref 26.0–34.0)
MCHC: 28 g/dL — ABNORMAL LOW (ref 30.0–36.0)
MCV: 108.3 fL — ABNORMAL HIGH (ref 80.0–100.0)
Platelets: 195 10*3/uL (ref 150–400)
RBC: 2.77 MIL/uL — ABNORMAL LOW (ref 3.87–5.11)
RDW: 14.3 % (ref 11.5–15.5)
WBC: 12.8 10*3/uL — ABNORMAL HIGH (ref 4.0–10.5)
nRBC: 0.2 % (ref 0.0–0.2)

## 2021-02-18 LAB — CBG MONITORING, ED: Glucose-Capillary: 117 mg/dL — ABNORMAL HIGH (ref 70–99)

## 2021-02-18 LAB — GLUCOSE, CAPILLARY
Glucose-Capillary: 128 mg/dL — ABNORMAL HIGH (ref 70–99)
Glucose-Capillary: 156 mg/dL — ABNORMAL HIGH (ref 70–99)

## 2021-02-18 LAB — BLOOD GAS, ARTERIAL
Acid-Base Excess: 17.3 mmol/L — ABNORMAL HIGH (ref 0.0–2.0)
Bicarbonate: 46.7 mmol/L — ABNORMAL HIGH (ref 20.0–28.0)
FIO2: 1
MECHVT: 400 mL
O2 Saturation: 99.8 %
PEEP: 5 cmH2O
Patient temperature: 37
RATE: 16 resp/min
pCO2 arterial: 95 mmHg (ref 32.0–48.0)
pH, Arterial: 7.3 — ABNORMAL LOW (ref 7.350–7.450)
pO2, Arterial: 237 mmHg — ABNORMAL HIGH (ref 83.0–108.0)

## 2021-02-18 LAB — TROPONIN I (HIGH SENSITIVITY)
Troponin I (High Sensitivity): 23 ng/L — ABNORMAL HIGH (ref <18)
Troponin I (High Sensitivity): 31 ng/L — ABNORMAL HIGH (ref <18)

## 2021-02-18 LAB — CK: Total CK: 127 U/L (ref 38–234)

## 2021-02-18 LAB — PROCALCITONIN: Procalcitonin: 0.98 ng/mL

## 2021-02-18 LAB — BRAIN NATRIURETIC PEPTIDE: B Natriuretic Peptide: 316.4 pg/mL — ABNORMAL HIGH (ref 0.0–100.0)

## 2021-02-18 MED ORDER — ENOXAPARIN SODIUM 40 MG/0.4ML IJ SOSY: 40.0000 mg | PREFILLED_SYRINGE | INTRAMUSCULAR | Status: DC

## 2021-02-18 MED ORDER — DOCUSATE SODIUM 100 MG PO CAPS
100.0000 mg | ORAL_CAPSULE | Freq: Two times a day (BID) | ORAL | Status: DC | PRN
  Filled 2021-02-18: qty 1

## 2021-02-18 MED ORDER — APIXABAN 5 MG PO TABS: 5.0000 mg | ORAL_TABLET | Freq: Two times a day (BID) | ORAL | Status: DC

## 2021-02-18 MED ORDER — DIAZEPAM 5 MG PO TABS: 2.5000 mg | ORAL_TABLET | Freq: Three times a day (TID) | ORAL | Status: DC | PRN

## 2021-02-18 MED ORDER — IPRATROPIUM-ALBUTEROL 0.5-2.5 (3) MG/3ML IN SOLN
3.0000 mL | Freq: Four times a day (QID) | RESPIRATORY_TRACT | Status: DC
  Administered 2021-02-19 – 2021-02-26 (×30): 3 mL via RESPIRATORY_TRACT
  Filled 2021-02-18 (×29): qty 3

## 2021-02-18 MED ORDER — METHYLPREDNISOLONE SODIUM SUCC 40 MG IJ SOLR
40.0000 mg | Freq: Two times a day (BID) | INTRAMUSCULAR | Status: DC
  Administered 2021-02-19 (×2): 40 mg via INTRAVENOUS
  Filled 2021-02-18 (×3): qty 1

## 2021-02-18 MED ORDER — METHYLPREDNISOLONE SODIUM SUCC 125 MG IJ SOLR
125.0000 mg | Freq: Once | INTRAMUSCULAR | Status: AC
  Administered 2021-02-18: 125 mg via INTRAVENOUS
  Filled 2021-02-18: qty 2

## 2021-02-18 MED ORDER — DILTIAZEM HCL 30 MG PO TABS: 30.0000 mg | ORAL_TABLET | Freq: Four times a day (QID) | ORAL | Status: DC

## 2021-02-18 MED ORDER — IPRATROPIUM-ALBUTEROL 0.5-2.5 (3) MG/3ML IN SOLN
3.0000 mL | Freq: Once | RESPIRATORY_TRACT | Status: AC
  Administered 2021-02-18: 3 mL via RESPIRATORY_TRACT
  Filled 2021-02-18: qty 3

## 2021-02-18 MED ORDER — METOPROLOL TARTRATE 50 MG PO TABS: 50.0000 mg | ORAL_TABLET | Freq: Two times a day (BID) | ORAL | Status: DC

## 2021-02-18 MED ORDER — ADENOSINE 12 MG/4ML IV SOLN
12.0000 mg | Freq: Once | INTRAVENOUS | Status: AC
  Administered 2021-02-18: 12 mg via INTRAVENOUS

## 2021-02-18 MED ORDER — SODIUM CHLORIDE 0.9 % IV SOLN
2.0000 g | Freq: Once | INTRAVENOUS | Status: AC
  Administered 2021-02-18: 2 g via INTRAVENOUS
  Filled 2021-02-18: qty 2

## 2021-02-18 MED ORDER — PANTOPRAZOLE SODIUM 40 MG IV SOLR
40.0000 mg | Freq: Every day | INTRAVENOUS | Status: DC
  Administered 2021-02-18 – 2021-03-02 (×13): 40 mg via INTRAVENOUS
  Filled 2021-02-18 (×13): qty 40

## 2021-02-18 MED ORDER — IPRATROPIUM-ALBUTEROL 0.5-2.5 (3) MG/3ML IN SOLN
3.0000 mL | RESPIRATORY_TRACT | Status: DC | PRN
  Administered 2021-03-02: 3 mL via RESPIRATORY_TRACT

## 2021-02-18 MED ORDER — INSULIN ASPART 100 UNIT/ML IJ SOLN
0.0000 [IU] | INTRAMUSCULAR | Status: DC
Start: 2021-02-18 — End: 2021-02-24
  Administered 2021-02-18: 2 [IU] via SUBCUTANEOUS
  Administered 2021-02-19 – 2021-02-24 (×10): 1 [IU] via SUBCUTANEOUS
  Administered 2021-02-24: 2 [IU] via SUBCUTANEOUS
  Filled 2021-02-18 (×10): qty 1

## 2021-02-18 MED ORDER — POLYETHYLENE GLYCOL 3350 17 G PO PACK
17.0000 g | PACK | Freq: Every day | ORAL | Status: DC | PRN
  Administered 2021-02-23: 17 g via ORAL
  Filled 2021-02-18: qty 1

## 2021-02-18 MED ORDER — QUETIAPINE FUMARATE 25 MG PO TABS: 25.0000 mg | ORAL_TABLET | Freq: Every evening | ORAL | Status: DC | PRN

## 2021-02-18 MED ORDER — ADENOSINE 12 MG/4ML IV SOLN
INTRAVENOUS | Status: AC
  Filled 2021-02-18: qty 4

## 2021-02-18 NOTE — Progress Notes (Addendum)
eLink Physician-Brief Progress Note Patient Name: PHYILLIS DASCOLI DOB: [birth year] MRN: [id number]   Date of Service  02/18/2021  HPI/Events of Note   70 y.o. female with a past medical history of COPD on 3 L nasal cannula, trach status anxiety, fibromyalgia, hypertension, presents to the emergency department for shortness of breath. Mucous plug. In ICU on Vent.NP notes reviewed.   Data: Na+: 143, mild hyperkalemia: 5.1, BUN/Cr.: 15/ 0.96, Serum CO2/ AG: 43/ 10, Hgb: 8.4, Troponin: 23, BNP elevated: 316, mild leukocytosis WBC: 12.8 PCT elevated: 0.98, ABG: 7.30/ 95/ 237/46.7, COVID-19 & influenza A/B: Negative CXR 02/18/21: Tracheostomy in stable position, no active cardiopulmonary disease  Camera: Alert. 400/11/10/33%, sats 96%. SBP 154. HR 119, sinus tachy.   Acute on chronic type 2 failure. COPD exacerbation. Mucosus plugging?  Unwitnessed falls. Agree with CT head and spine, c collar  SVT vs a fib.  If CT head neg, can resume home eliquis and rate control meds  Chronic anemia   eICU Interventions  Continue current management-on abx/nebs.lung protective ventilation.  On VTE prophylaxis. CBG goals < 180. On ssi. Steroids- watch for hyperglycemia      Intervention Category Major Interventions: Respiratory failure - evaluation and management Evaluation Type: New Patient Evaluation  Ranee Gosselin 02/18/2021, 10:41 PM

## 2021-02-18 NOTE — ED Notes (Signed)
Place a iv team consult patient difficult iv stick

## 2021-02-18 NOTE — H&P (Signed)
NAME:  Virginia [name], MRN:  [id number], DOB:  10/27/1950, LOS: 0 ADMISSION DATE:  02/18/2021, CONSULTATION DATE:  02/18/21 REFERRING MD:  Dr. Lenard Lance, CHIEF COMPLAINT:  Shortness of breath   History of Present Illness:  70 year old female presented to [facility] ED via EMS on 02/18/2021 with acute on chronic combined hypoxic and hypercapnic respiratory failure.   Per son, Virginia [name], patient lives at home alone and is assisted daily by himself and his wife as well as a home health nurse who comes out 3 times a week.  Virginia [name] reports it is he and his wife who help put her on her trilogy machine nightly.  The patient normally uses the trilogy as a CPAP mask with the tracheostomy capped overnight and is on 4.5 to 5 L nasal cannula during the day.   The patient's son reported that he and wife came down with COVID-19 last Wednesday, 02/14/2021, and have not been available to help the patient use this machine at night since 02/13/21, and so she has gone without it. Virginia [name] stated that his mother and her home health nurse reported to him that she has had multiple falls. He stated that yesterday, on 02/17/21, she fell in the bathroom and was stranded on the floor from 3 am until the home helper arrived hours later. The son, daughter-in-law and home health were concerned about the patient's respiratory status, specifically possible CO2 retention, and EMS was called. The first 2 times the patient refused to go with EMS. The third time however the patient's SpO2 was reading in the 50's with a good waveform, she was placed on CPAP and brought to the ED for evaluation.  ED course: Upon arrival in the ED patient was transitioned from CPAP to mechanical ventilation via chronic tracheostomy. Shortly after arrival patient became extremely tachycardic with heart rates in the 200s and what appeared to be SVT.  She received 12 mg of adenosine and converted back to NSR/ST in the low 100's.  RT reported to EDP that the trach  in place was fairly clogged with mucus and was changed out for a cuffed tracheostomy.Patient also received DuoNeb x2 & 125 mg of Solu-Medrol. Initial Vitals: afebrile- 98.9, tachypneic with RR 35, tachycardic with HR 109, BP stable- 133/76 (93), & SpO2 94% on ventilator via chronic tracheostomy (Labs/ Imaging personally reviewed) Significant labs: Na+: 143, mild hyperkalemia: 5.1, BUN/Cr.: 15/ 0.96, Serum CO2/ AG: 43/ 10, Hgb: 8.4, Troponin: 23, BNP elevated: 316, mild leukocytosis WBC: 12.8 PCT elevated: 0.98, ABG: 7.30/ 95/ 237/46.7, COVID-19 & influenza A/B: Negative CXR 02/18/21: Tracheostomy in stable position, no active cardiopulmonary disease I, Virginia [name], AGACNP-BC, personally viewed and interpreted this ECG. EKG Interpretation Date: 02/18/21 EKG Time: 19:14 Rate: 207 Rhythm: SVT vs A-fib RVR (lots of artifact present, interpretation challenging) QRS Axis:  normal Intervals: QRS narrow- tachycardic with lots of artifact present making interval measurement challenging ST/T Wave abnormalities: none noted- but challenging read with artifact Narrative Interpretation: SVT vs A-fib RVR, tachycardic with lots of artifact present making interval measurement challenging  Patient's son Virginia [name] denies any recent complaints by his mother including: fever/ nausea/ vomiting/ diarrhea/ worsening cough or sputum production/ chest pain.  PCCM consulted to admit to ICU as patient is requiring mechanical ventilation support.  The patient's mentation has improved but PCO2 after 1 hour is 95.  Pertinent  Medical History  Atrial Fibrillation on Eliquis C. Difficile Severe COPD Chronic tracheostomy Anxiety & depression Hepatitis C Arthritis HTN Fibromyalgia Anemia CAD  Thoracic AAA Significant Hospital Events: Including procedures, antibiotic start and stop dates in addition to other pertinent events   02/18/21: Admit to ICU with acute on chronic combined hypoxic/hypercapnic respiratory  failure with chronic tracheostomy requiring mechanical ventilation  Interim History / Subjective:  Patient alert and responsive, able to follow some commands, but still appears slightly confused.  She is unable to answer consistently when asked questions.  She denies pain, and reports she last took her medication " last night". Objective   Blood pressure 138/74, pulse (!) 120, temperature 98.9 F (37.2 C), temperature source Oral, resp. rate (!) 21, SpO2 97 %.    Vent Mode: AC FiO2 (%):  [100 %] 100 % Set Rate:  [16 bmp-20 bmp] 20 bmp Vt Set:  [400 mL] 400 mL PEEP:  [5 cmH20] 5 cmH20  No intake or output data in the 24 hours ending 02/18/21 2057 There were no vitals filed for this visit.  Examination: General: Adult female, acutely ill, lying in bed intubated & sedated requiring mechanical ventilation, NAD HEENT: MM pink/moist, anicteric, atraumatic, neck supple Neuro: A&O x 2?, able to follow intermittent commands, PERRL +3, MAE CV: s1s2 RRR, ST on monitor, no r/m/g Pulm: Regular, non labored on AC on 35% with PEEP 5, breath sounds coarse-BUL & diminished-BLL GI: soft, rounded, non tender, bs x 4 Skin: no rashes/lesions noted Extremities: warm/dry, pulses + 2 R/P, +1 edema noted BLE  Resolved Hospital Problem list     Assessment & Plan:  Acute on Chronic combined Hypoxic/ Hypercapnic Respiratory Failure secondary to COPD exacerbation & possible mucus plug? PMHx: Severe COPD, chronic tracheostomy (4.5-5 L [place] day & trilogy CPAP with capped trach at night) - Continue mechanical ventilation overnight, wean FiO2 as tolerated - Supplemental O2 to maintain SpO2 > 90% - Intermittent chest x-ray & ABG PRN, f/u ABG in AM - Ensure adequate pulmonary hygiene  - tracheal aspirate sent, trend PCT, daily CBC- monitor WBC/fever curve - Baseline PCT elevated at 0.98, pt meets risk factors for pseudomonas > start Cefepime for empiric coverage - solu-medrol 40 mg BID - budesonide nebs BID,  duo-neb scheduled Q 6, bronchodilators PRN  Unwitnessed Falls Per son patient fell multiple times in bathroom over last 2-3 days while on systemic AC. Last fall left her on the bathroom floor for hours. Kidney function re-assuring - check CK - STAT CT head & C-spine > hold AC until results are available - falls precautions  Suspected SVT vs A-fib RVR s/p adenosine Mildly Elevated troponin secondary to suspected demand ischemia: 23 > 31 PMHx: PAF on Eliquis, CAD - place NGT for PO access - re-order home rate control medication: diltiazem & metoprolol - restart Eliquis if CT head negative for abnormality - continuous cardiac monitoring  Acute Encephalopathy suspected to be metabolic secondary to hypercapnia in the setting of COPD exacerbation PMHx: anxiety, depression - hold sedating medications as patient's mentation clears with ventilatory support - valium PRN continued for anxiety   Chronic Anemia in the setting of Chronic Disease PMHx: anemia - Monitor for s/s of bleeding - Daily CBC - Transfuse for Hgb <7  Mild Transaminitis: AST 47, total bilirubin 1.4 PMHx:  Hep C - Trend hepatic function - avoid hepatotoxic agents: hold home Crestor  Risk for Steroid Induced Hyperglycemia - Monitor CBG Q 4 hours - SSI sensitive dosing - target range while in ICU: 140-180 - follow ICU hyper/hypo-glycemia protocol  Best Practice (right click and "Reselect all SmartList Selections" daily)  Diet/type: NPO DVT  prophylaxis: LMWH GI prophylaxis: PPI Lines: N/A Foley:  N/A Code Status:  full code Last date of multidisciplinary goals of care discussion [02/18/21]  Labs   CBC: Recent Labs  Lab 02/18/21 1905  WBC 12.8*  HGB 8.4*  HCT 30.0*  MCV 108.3*  PLT 195    Basic Metabolic Panel: Recent Labs  Lab 02/18/21 1905  NA 143  K 5.1  CL 90*  CO2 43*  GLUCOSE 126*  BUN 15  CREATININE 0.96  CALCIUM 9.0   GFR: CrCl cannot be calculated (Unknown ideal weight.). Recent  Labs  Lab 02/18/21 1905  WBC 12.8*    Liver Function Tests: Recent Labs  Lab 02/18/21 1905  AST 47*  ALT 26  ALKPHOS 22*  BILITOT 1.4*  PROT 6.7  ALBUMIN 3.9   No results for input(s): LIPASE, AMYLASE in the last 168 hours. No results for input(s): AMMONIA in the last 168 hours.  ABG    Component Value Date/Time   PHART 7.30 (L) 02/18/2021 1913   PCO2ART 95 (HH) 02/18/2021 1913   PO2ART 237 (H) 02/18/2021 1913   HCO3 46.7 (H) 02/18/2021 1913   O2SAT 99.8 02/18/2021 1913     Coagulation Profile: No results for input(s): INR, PROTIME in the last 168 hours.  Cardiac Enzymes: No results for input(s): CKTOTAL, CKMB, CKMBINDEX, TROPONINI in the last 168 hours.  HbA1C: Hgb A1c MFr Bld  Date/Time Value Ref Range Status  01/08/2021 06:28 AM 4.9 4.8 - 5.6 % Final    Comment:    (NOTE) Pre diabetes:          5.7%-6.4%  Diabetes:              >6.4%  Glycemic control for   <7.0% adults with diabetes   06/06/2020 12:07 PM 5.3 4.8 - 5.6 % Final    Comment:    (NOTE) Pre diabetes:          5.7%-6.4%  Diabetes:              >6.4%  Glycemic control for   <7.0% adults with diabetes     CBG: No results for input(s): GLUCAP in the last 168 hours.  Review of Systems:   Interview challenging due to hypercapnia- she denies pain/nausea.  Past Medical History:  She,  has a past medical history of Anemia, Anxiety, Arthritis, Atrial fibrillation (HCC), C. difficile colitis (09/2015), COPD (chronic obstructive pulmonary disease) (HCC), Depression, Dyspnea, Dysrhythmia, Fibromyalgia, H/O tracheostomy, Hep C w/ coma, chronic, Hypertension, MRSA pneumonia (HCC) (2017), On home oxygen therapy, Osteoporosis, Peripheral neuropathy, RLS (restless legs syndrome), S/P percutaneous endoscopic gastrostomy (PEG) tube placement (HCC) (09/2015), and Ventilator associated pneumonia (HCC) (10/2015).   Surgical History:   Past Surgical History:  Procedure Laterality Date   ABDOMINAL  HYSTERECTOMY     BREAST SURGERY Bilateral    Breast Implants   DILATION AND CURETTAGE OF UTERUS     PEG PLACEMENT N/A 06/21/2020   Procedure: PERCUTANEOUS ENDOSCOPIC GASTROSTOMY (PEG) PLACEMENT;  Surgeon: Regis Bill, MD;  Location: [facility] ENDOSCOPY;  Service: Endoscopy;  Laterality: N/A;   REVERSE SHOULDER ARTHROPLASTY Right 03/06/2017   Procedure: REVERSE SHOULDER ARTHROPLASTY;  Surgeon: Christena Flake, MD;  Location: [facility] ORS;  Service: Orthopedics;  Laterality: Right;   ROTATOR CUFF REPAIR Bilateral    TRACHEOSTOMY TUBE PLACEMENT N/A 06/15/2020   Procedure: TRACHEOSTOMY;  Surgeon: Geanie Logan, MD;  Location: [facility] ORS;  Service: ENT;  Laterality: N/A;     Social History:   reports that  she quit smoking about 5 years ago. Her smoking use included cigarettes. She smoked an average of 1.5 packs per day. She has never used smokeless tobacco. She reports that she does not drink alcohol and does not use drugs.   Family History:  Her family history includes Hypertension in her mother.   Allergies No Known Allergies   Home Medications  Prior to Admission medications   Medication Sig Start Date End Date Taking? Authorizing Provider  albuterol (VENTOLIN HFA) 108 (90 Base) MCG/ACT inhaler Inhale 2 puffs into the lungs every 4 (four) hours as needed for wheezing or shortness of breath.    [provider]  amitriptyline (ELAVIL) 25 MG tablet Take 25 mg by mouth at bedtime.  01/14/19   [provider]  apixaban (ELIQUIS) 5 MG TABS tablet Take 1 tablet (5 mg total) by mouth 2 (two) times daily. 11/10/16   Adrian Saran, MD  budesonide (PULMICORT) 0.5 MG/2ML nebulizer solution Take by nebulization. 11/28/20   [provider]  diazepam (VALIUM) 5 MG tablet Take 0.5 tablets (2.5 mg total) by mouth every 8 (eight) hours as needed for anxiety. 08/10/20   Lurene Shadow, MD  diltiazem (CARDIZEM CD) 120 MG 24 hr capsule Take 120 mg by mouth daily. 04/29/19   [provider]  estradiol (ESTRACE) 1 MG tablet Take 1 mg by mouth daily. 09/10/16   [provider]  furosemide (LASIX) 20 MG tablet Take 20 mg by mouth daily.  10/01/16   [provider]  gabapentin (NEURONTIN) 300 MG capsule Take 300 mg by mouth 3 (three) times daily. 05/06/20   [provider]  ipratropium-albuterol (DUONEB) 0.5-2.5 (3) MG/3ML SOLN Take 3 mLs by nebulization every 6 (six) hours as needed. 08/10/20   Lurene Shadow, MD  iron polysaccharides (NIFEREX) 150 MG capsule Take 1 capsule (150 mg total) by mouth daily. 01/12/21 04/12/21  Gillis Santa, MD  lisinopril (ZESTRIL) 10 MG tablet Take 10 mg by mouth daily.    [provider]  MAGNESIUM-OXIDE 400 (241.3 Mg) MG tablet Take 400 mg by mouth daily.  08/24/16   [provider]  methocarbamol (ROBAXIN) 500 MG tablet Take 500 mg by mouth 2 (two) times daily. 12/27/20   [provider]  metoprolol tartrate (LOPRESSOR) 50 MG tablet Take 50 mg by mouth 2 (two) times daily. 04/23/20   [provider]  Multiple Vitamin (MULTIVITAMIN) tablet Take 1 tablet by mouth daily.    [provider]  omeprazole (PRILOSEC) 40 MG capsule Take 40 mg by mouth daily. 04/16/19   [provider]  predniSONE (DELTASONE) 5 MG tablet Take 5 mg by mouth daily. 12/25/20   [provider]  QUEtiapine (SEROQUEL) 25 MG tablet Take 25 mg by mouth at bedtime.  10/01/16   [provider]  rosuvastatin (CRESTOR) 10 MG tablet Take 10 mg by mouth at bedtime. 10/23/20   [provider]  TRELEGY ELLIPTA 100-62.5-25 MCG/INH AEPB Inhale 1 puff into the lungs daily. 06/04/20   [provider]  vitamin B-12 100 MCG tablet Take 1 tablet (100 mcg total) by mouth daily. 01/12/21 04/12/21  Gillis Santa, MD  Vitamin D, Ergocalciferol, (DRISDOL) 1.25 MG (50000 UNIT) CAPS capsule Take 1 capsule (50,000 Units total) by mouth every 7 (seven) days. 01/17/21 04/17/21  Gillis Santa, MD      Critical care time: 51 minutes       Virginia [name], AGACNP-BC Acute Care Nurse Practitioner [facility]  [phone] / [phone] Please see Amion for pager details.

## 2021-02-18 NOTE — ED Provider Notes (Signed)
[facility] Emergency Department Provider Note  Time seen: 6:57 PM  I have reviewed the triage vital signs and the nursing notes.   HISTORY  Chief Complaint Shortness of Breath (Sob , trach, end stage copd)   HPI Virginia [name] is a 70 y.o. female with a past medical history of COPD on 3 L nasal cannula, anxiety, fibromyalgia, hypertension, presents to the emergency department for shortness of breath.  According to EMS report patient fell earlier today has been feeling short of breath EMS has been to the residents 3 times now the first 2 patient and son refused transport however on this last visit patient was short of breath with a pulse oximetry of 50% on 5 L with a good waveform.  Patient was placed on CPAP and brought to the emergency department.  Patient has a tracheostomy.  Patient only answering some questions at this time.  Possibly confused.   Past Medical History:  Diagnosis Date   Anemia    Anxiety    Arthritis    Atrial fibrillation (HCC)    C. difficile colitis 09/2015   COPD (chronic obstructive pulmonary disease) (HCC)    Depression    Dyspnea    Dysrhythmia    Fibromyalgia    H/O tracheostomy    Hep C w/ coma, chronic    Hypertension    MRSA pneumonia (HCC) 2017   On home oxygen therapy    3 L/M    Osteoporosis    Peripheral neuropathy    RLS (restless legs syndrome)    S/P percutaneous endoscopic gastrostomy (PEG) tube placement (HCC) 09/2015   Ventilator associated pneumonia (HCC) 10/2015   [facility], [place]    Patient Active Problem List   Diagnosis Date Noted   Malnutrition of moderate degree 01/08/2021   On mechanically assisted ventilation (HCC) 01/07/2021   Hyperlipidemia 01/07/2021   Anxiety 01/07/2021   Acute on chronic respiratory failure (HCC) 01/06/2021   Anorexia 08/04/2020   Severe protein-calorie malnutrition (HCC) 08/04/2020   Pressure injury of skin 07/14/2020   Diarrhea    Reactive thrombocytosis  07/12/2020   Aspiration pneumonia of both lower lobes due to gastric secretions (HCC) 07/07/2020   Acute urinary retention 07/06/2020   Acute metabolic encephalopathy 07/06/2020   Acute on chronic respiratory failure with hypoxia and hypercapnia (HCC) 07/06/2020   COPD exacerbation (HCC)    Respiratory failure (HCC) 06/06/2020   Acute respiratory failure with hypoxia (HCC) 06/02/2020   COPD with acute exacerbation (HCC) 06/02/2020   Shortness of breath 06/02/2020   GERD (gastroesophageal reflux disease) 06/02/2020   Iron deficiency anemia 01/22/2020   Anemia of chronic disease 01/17/2019   Coronary artery disease involving native coronary artery of native heart 04/30/2018   Palpitations 04/30/2018   Chronic heartburn 04/12/2018   Dysphagia 04/12/2018   Thoracic aortic aneurysm without rupture (HCC) 08/27/2017   Status post reverse total shoulder replacement, 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   SOBOE (shortness of breath on exertion) 01/24/2017   AF (paroxysmal atrial fibrillation) (HCC) 11/09/2016   Chronic hypoxemic respiratory failure (HCC) 12/06/2015   Depression 12/06/2015   Fibromyalgia 12/06/2015   Hep C w/o coma, chronic (HCC) 12/06/2015   History of tracheostomy 12/06/2015   Hypertension 12/06/2015   Localized osteoporosis without current pathological fracture 12/06/2015   PEG (percutaneous endoscopic gastrostomy) status (HCC) 12/06/2015   Peripheral neuropathy, idiopathic 12/06/2015   RLS (restless legs syndrome) 12/06/2015   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   COPD with acute lower respiratory infection (  HCC) 11/29/2015    Past Surgical History:  Procedure Laterality Date   ABDOMINAL HYSTERECTOMY     BREAST SURGERY Bilateral    Breast Implants   DILATION AND CURETTAGE OF UTERUS     PEG PLACEMENT N/A 06/21/2020   Procedure: PERCUTANEOUS ENDOSCOPIC GASTROSTOMY (PEG) PLACEMENT;  Surgeon: Regis BillLocklear, Cameron T, MD;  Location: [facility] ENDOSCOPY;  Service: Endoscopy;  Laterality:  N/A;   REVERSE SHOULDER ARTHROPLASTY Right 03/06/2017   Procedure: REVERSE SHOULDER ARTHROPLASTY;  Surgeon: Christena FlakePoggi, John J, MD;  Location: [facility] ORS;  Service: Orthopedics;  Laterality: Right;   ROTATOR CUFF REPAIR Bilateral    TRACHEOSTOMY TUBE PLACEMENT N/A 06/15/2020   Procedure: TRACHEOSTOMY;  Surgeon: Geanie LoganBennett, Paul, MD;  Location: [facility] ORS;  Service: ENT;  Laterality: N/A;    Prior to Admission medications   Medication Sig Start Date End Date Taking? Authorizing Provider  albuterol (VENTOLIN HFA) 108 (90 Base) MCG/ACT inhaler Inhale 2 puffs into the lungs every 4 (four) hours as needed for wheezing or shortness of breath.    [provider]  amitriptyline (ELAVIL) 25 MG tablet Take 25 mg by mouth at bedtime.  01/14/19   [provider]  apixaban (ELIQUIS) 5 MG TABS tablet Take 1 tablet (5 mg total) by mouth 2 (two) times daily. 11/10/16   Adrian SaranMody, Sital, MD  budesonide (PULMICORT) 0.5 MG/2ML nebulizer solution Take by nebulization. 11/28/20   [provider]  diazepam (VALIUM) 5 MG tablet Take 0.5 tablets (2.5 mg total) by mouth every 8 (eight) hours as needed for anxiety. 08/10/20   Lurene ShadowAyiku, Bernard, MD  diltiazem (CARDIZEM CD) 120 MG 24 hr capsule Take 120 mg by mouth daily. 04/29/19   [provider]  estradiol (ESTRACE) 1 MG tablet Take 1 mg by mouth daily. 09/10/16   [provider]  furosemide (LASIX) 20 MG tablet Take 20 mg by mouth daily.  10/01/16   [provider]  gabapentin (NEURONTIN) 300 MG capsule Take 300 mg by mouth 3 (three) times daily. 05/06/20   [provider]  ipratropium-albuterol (DUONEB) 0.5-2.5 (3) MG/3ML SOLN Take 3 mLs by nebulization every 6 (six) hours as needed. 08/10/20   Lurene ShadowAyiku, Bernard, MD  iron polysaccharides (NIFEREX) 150 MG capsule Take 1 capsule (150 mg total) by mouth daily. 01/12/21 04/12/21  Gillis SantaKumar, Dileep, MD  lisinopril (ZESTRIL) 10 MG tablet Take 10 mg by mouth daily.    [provider]   MAGNESIUM-OXIDE 400 (241.3 Mg) MG tablet Take 400 mg by mouth daily.  08/24/16   [provider]  methocarbamol (ROBAXIN) 500 MG tablet Take 500 mg by mouth 2 (two) times daily. 12/27/20   [provider]  metoprolol tartrate (LOPRESSOR) 50 MG tablet Take 50 mg by mouth 2 (two) times daily. 04/23/20   [provider]  Multiple Vitamin (MULTIVITAMIN) tablet Take 1 tablet by mouth daily.    [provider]  omeprazole (PRILOSEC) 40 MG capsule Take 40 mg by mouth daily. 04/16/19   [provider]  predniSONE (DELTASONE) 5 MG tablet Take 5 mg by mouth daily. 12/25/20   [provider]  QUEtiapine (SEROQUEL) 25 MG tablet Take 25 mg by mouth at bedtime.  10/01/16   [provider]  rosuvastatin (CRESTOR) 10 MG tablet Take 10 mg by mouth at bedtime. 10/23/20   [provider]  TRELEGY ELLIPTA 100-62.5-25 MCG/INH AEPB Inhale 1 puff into the lungs daily. 06/04/20   [provider]  vitamin B-12 100 MCG tablet Take 1 tablet (100 mcg total)  by mouth daily. 01/12/21 04/12/21  Gillis Santa, MD  Vitamin D, Ergocalciferol, (DRISDOL) 1.25 MG (50000 UNIT) CAPS capsule Take 1 capsule (50,000 Units total) by mouth every 7 (seven) days. 01/17/21 04/17/21  Gillis Santa, MD    No Known Allergies  Family History  Problem Relation Age of Onset   Hypertension Mother     Social History Social History   Tobacco Use   Smoking status: Former    Packs/day: 1.50    Types: Cigarettes    Quit date: 10/05/2015    Years since quitting: 5.3   Smokeless tobacco: Never  Vaping Use   Vaping Use: Some days   Last attempt to quit: 10/05/2015  Substance Use Topics   Alcohol use: No   Drug use: No    Review of Systems Constitutional: Negative for fever. Cardiovascular: Negative for chest pain. Respiratory: Positive for shortness of breath.  Gastrointestinal: Negative for abdominal pain, vomiting  Musculoskeletal: Negative for musculoskeletal  complaints Neurological: Negative for headache All other ROS negative, although likely limited due to confusion.  ____________________________________________   PHYSICAL EXAM:  VITAL SIGNS: ED Triage Vitals [02/18/21 1854]  Enc Vitals Group     BP 133/76     Pulse Rate (!) 109     Resp (!) 35     Temp 98.9 F (37.2 C)     Temp Source Oral     SpO2 (!) 80 %     Weight      Height      Head Circumference      Peak Flow      Pain Score      Pain Loc      Pain Edu?      Excl. in GC?     Constitutional: Patient is awake and alert, will occasionally answer questions other times she will just stare to not answer.  Possibly confused. Eyes: Normal exam ENT      Head: Normocephalic and atraumatic.      Mouth/Throat: Mucous membranes are moist.  Tracheostomy present. Cardiovascular: Normal rate, regular rhythm.  Respiratory: Tachypneic around 30 to 40 breaths/min with decreased air movement bilaterally.  No obvious wheeze rales or rhonchi. Gastrointestinal: Soft and nontender. No distention.   Musculoskeletal: Nontender with normal range of motion in all extremities.  Neurologic:  Normal speech and language. No gross focal neurologic deficits  Skin:  Skin is warm, dry and intact.  Psychiatric: Mood and affect are normal.   ____________________________________________    EKG  EKG viewed and interpreted by myself appears to show a regular narrow complex rhythm most likely SVT 207 bpm.  No obvious ST changes.  ____________________________________________    RADIOLOGY  Chest x-ray shows no acute disease.  ____________________________________________   INITIAL IMPRESSION / ASSESSMENT AND PLAN / ED COURSE  Pertinent labs & imaging results that were available during my care of the patient were reviewed by me and considered in my medical decision making (see chart for details).   Patient presents emergency department for respiratory distress.  Per report patient had a fall  earlier this morning has been feeling very short of breath but refused transport, EMS states on their third visit to the patient's residence she was satting 50% on 5 L of oxygen with a good waveform.  Upon arrival patient is on CPAP with a pulse oximetry of 80%.  Patient does however have a tracheostomy present.  Upon arrival we have transition the patient from CPAP to a ventilator.  We will dose  duo nebs, Solu-Medrol obtain labs, COVID, chest x-ray and continue to closely monitor.  Patient is a full code per report.  Respiratory therapist states tracheostomy was fairly clogged with mucus.  He was able to suction/clear the tracheostomy.  Patient is now on a ventilator.  We will check an ABG shortly.  Patient satting much better.  Shortly after arrival to the emergency department patient became extremely tachycardic to 200+ beats per minute.  On the monitor rhythm appeared to be SVT.  Narrow complex and rapid.  Patient was given 12 mg of adenosine and she converted back to a normal sinus rhythm/sinus tachycardia around 110 bpm.  Patient denies any chest pain.  Patient's PCO2 has resulted fairly elevated at 95 likely the cause of the patient's confusion.  Patient is satting very well now with a pulse ox in the upper 90s.  No further tachyarrhythmias.  Given the patient's significant COPD exacerbation with hypercarbia now on a ventilator assist through her tracheostomy patient will be admitted to the intensive care unit for further work-up and treatment.  Patient has received Solu-Medrol and duo nebs in the emergency department  TYFFANY WALDROP was evaluated in Emergency Department on 02/18/2021 for the symptoms described in the history of present illness. She was evaluated in the context of the global COVID-19 pandemic, which necessitated consideration that the patient might be at risk for infection with the SARS-CoV-2 virus that causes COVID-19. Institutional protocols and algorithms that pertain to the  evaluation of patients at risk for COVID-19 are in a state of rapid change based on information released by regulatory bodies including the CDC and federal and state organizations. These policies and algorithms were followed during the patient's care in the ED.  CRITICAL CARE Performed by: Minna Antis   Total critical care time: 45 minutes  Critical care time was exclusive of separately billable procedures and treating other patients.  Critical care was necessary to treat or prevent imminent or life-threatening deterioration.  Critical care was time spent personally by me on the following activities: development of treatment plan with patient and/or surrogate as well as nursing, discussions with consultants, evaluation of patient's response to treatment, examination of patient, obtaining history from patient or surrogate, ordering and performing treatments and interventions, ordering and review of laboratory studies, ordering and review of radiographic studies, pulse oximetry and re-evaluation of patient's condition.  ____________________________________________   FINAL CLINICAL IMPRESSION(S) / ED DIAGNOSES  COPD exacerbation Hypoxia Respiratory distress   Minna Antis, MD 02/18/21 2103

## 2021-02-18 NOTE — ED Notes (Signed)
Report called to icu rn all questions answered, will take pt to ct prior to going to icu, ct contacted and will, be to ed in 10-15 minutes

## 2021-02-19 ENCOUNTER — Inpatient Hospital Stay: Payer: Medicare Other

## 2021-02-19 ENCOUNTER — Other Ambulatory Visit: Payer: Self-pay

## 2021-02-19 DIAGNOSIS — J9621 Acute and chronic respiratory failure with hypoxia: Secondary | ICD-10-CM | POA: Diagnosis not present

## 2021-02-19 DIAGNOSIS — J9622 Acute and chronic respiratory failure with hypercapnia: Secondary | ICD-10-CM | POA: Diagnosis not present

## 2021-02-19 LAB — BASIC METABOLIC PANEL WITH GFR
Anion gap: 12 (ref 5–15)
BUN: 17 mg/dL (ref 8–23)
CO2: 38 mmol/L — ABNORMAL HIGH (ref 22–32)
Calcium: 9 mg/dL (ref 8.9–10.3)
Chloride: 92 mmol/L — ABNORMAL LOW (ref 98–111)
Creatinine, Ser: 0.94 mg/dL (ref 0.44–1.00)
GFR, Estimated: >60 mL/min
Glucose, Bld: 129 mg/dL — ABNORMAL HIGH (ref 70–99)
Potassium: 4.2 mmol/L (ref 3.5–5.1)
Sodium: 142 mmol/L (ref 135–145)

## 2021-02-19 LAB — MRSA NEXT GEN BY PCR, NASAL: MRSA by PCR Next Gen: NOT DETECTED

## 2021-02-19 LAB — GLUCOSE, CAPILLARY
Glucose-Capillary: 125 mg/dL — ABNORMAL HIGH (ref 70–99)
Glucose-Capillary: 114 mg/dL — ABNORMAL HIGH (ref 70–99)
Glucose-Capillary: 139 mg/dL — ABNORMAL HIGH (ref 70–99)
Glucose-Capillary: 139 mg/dL — ABNORMAL HIGH (ref 70–99)
Glucose-Capillary: 133 mg/dL — ABNORMAL HIGH (ref 70–99)
Glucose-Capillary: 115 mg/dL — ABNORMAL HIGH (ref 70–99)

## 2021-02-19 LAB — BLOOD GAS, ARTERIAL
Acid-Base Excess: 17.3 mmol/L — ABNORMAL HIGH (ref 0.0–2.0)
Bicarbonate: 41.2 mmol/L — ABNORMAL HIGH (ref 20.0–28.0)
FIO2: 35
MECHVT: 400 mL
Mechanical Rate: 20
O2 Saturation: 97.9 %
PEEP: 5 cmH2O
Patient temperature: 37
pCO2 arterial: 46 mmHg (ref 32.0–48.0)
pH, Arterial: 7.56 — ABNORMAL HIGH (ref 7.350–7.450)
pO2, Arterial: 88 mmHg (ref 83.0–108.0)

## 2021-02-19 LAB — CBC
HCT: 27.6 % — ABNORMAL LOW (ref 36.0–46.0)
Hemoglobin: 8.1 g/dL — ABNORMAL LOW (ref 12.0–15.0)
MCH: 31.2 pg (ref 26.0–34.0)
MCHC: 29.3 g/dL — ABNORMAL LOW (ref 30.0–36.0)
MCV: 106.2 fL — ABNORMAL HIGH (ref 80.0–100.0)
Platelets: 189 10*3/uL (ref 150–400)
RBC: 2.6 MIL/uL — ABNORMAL LOW (ref 3.87–5.11)
RDW: 14.5 % (ref 11.5–15.5)
WBC: 8.4 10*3/uL (ref 4.0–10.5)
nRBC: 0 % (ref 0.0–0.2)

## 2021-02-19 LAB — RESPIRATORY PANEL BY PCR

## 2021-02-19 LAB — MAGNESIUM: Magnesium: 2 mg/dL (ref 1.7–2.4)

## 2021-02-19 LAB — PHOSPHORUS
Phosphorus: 1.1 mg/dL — ABNORMAL LOW (ref 2.5–4.6)
Phosphorus: 3.4 mg/dL (ref 2.5–4.6)

## 2021-02-19 LAB — PROTIME-INR
INR: 1.2 (ref 0.8–1.2)
Prothrombin Time: 15 seconds (ref 11.4–15.2)

## 2021-02-19 LAB — PROCALCITONIN: Procalcitonin: 1.16 ng/mL

## 2021-02-19 MED ORDER — MORPHINE SULFATE (PF) 2 MG/ML IV SOLN: 1.0000 mg | INTRAVENOUS | Status: DC | PRN

## 2021-02-19 MED ORDER — METOPROLOL TARTRATE 5 MG/5ML IV SOLN
5.0000 mg | Freq: Four times a day (QID) | INTRAVENOUS | Status: DC
  Administered 2021-02-19 – 2021-03-03 (×50): 5 mg via INTRAVENOUS
  Filled 2021-02-19 (×51): qty 5

## 2021-02-19 MED ORDER — LORAZEPAM 2 MG/ML IJ SOLN
1.0000 mg | Freq: Once | INTRAMUSCULAR | Status: AC
  Administered 2021-02-19: 1 mg via INTRAVENOUS

## 2021-02-19 MED ORDER — SODIUM CHLORIDE 0.9 % IV SOLN
2.0000 g | Freq: Two times a day (BID) | INTRAVENOUS | Status: DC
  Administered 2021-02-19 – 2021-02-23 (×9): 2 g via INTRAVENOUS
  Filled 2021-02-19 (×10): qty 2

## 2021-02-19 MED ORDER — ENOXAPARIN SODIUM 60 MG/0.6ML IJ SOSY
1.0000 mg/kg | PREFILLED_SYRINGE | Freq: Two times a day (BID) | INTRAMUSCULAR | Status: DC
  Administered 2021-02-19 – 2021-02-22 (×8): 55 mg via SUBCUTANEOUS
  Filled 2021-02-19 (×9): qty 0.55

## 2021-02-19 MED ORDER — DIAZEPAM 5 MG/ML IJ SOLN
2.5000 mg | Freq: Four times a day (QID) | INTRAMUSCULAR | Status: DC | PRN
  Administered 2021-02-19 – 2021-02-26 (×21): 2.5 mg via INTRAVENOUS
  Filled 2021-02-19 (×21): qty 2

## 2021-02-19 MED ORDER — LORAZEPAM 2 MG/ML IJ SOLN
INTRAMUSCULAR | Status: AC
  Filled 2021-02-19: qty 1

## 2021-02-19 MED ORDER — CHLORHEXIDINE GLUCONATE CLOTH 2 % EX PADS
6.0000 | MEDICATED_PAD | Freq: Every day | CUTANEOUS | Status: DC
  Administered 2021-02-19 – 2021-03-11 (×19): 6 via TOPICAL

## 2021-02-19 MED ORDER — SODIUM PHOSPHATES 45 MMOLE/15ML IV SOLN
45.0000 mmol | Freq: Once | INTRAVENOUS | Status: AC
  Administered 2021-02-19: 45 mmol via INTRAVENOUS
  Filled 2021-02-19: qty 15

## 2021-02-19 NOTE — Progress Notes (Signed)
No voids since ICU admission. Bladder scan showed 611 ml residual. Britton-Lee, NP notified and In/Out cath performed using 2 person sterile technique. clear, amber urine drained.

## 2021-02-19 NOTE — Progress Notes (Signed)
Patient arrived with only one working PIV. IV team unable to obtain a second in ED. NP made aware.

## 2021-02-19 NOTE — Progress Notes (Signed)
Attempted to decrease Pressure support but patients tidal volumes dropped from the mid to upper 300's down to the mid to lower 200's. Pressure support put back at 20cmH20 at this time.

## 2021-02-19 NOTE — Progress Notes (Signed)
PO access attempted using Dobhoff x 2. Unable to place. Will defer at this time.

## 2021-02-19 NOTE — Progress Notes (Signed)
ANTICOAGULATION CONSULT NOTE   Pharmacy Consult for Lovenox Dosing Indication: atrial fibrillation  No Known Allergies  Patient Measurements: Height: 5' (152.4 cm) Weight: 54.3 kg (119 lb 11.4 oz) IBW/kg (Calculated) : 45.5   Vital Signs: Temp: 101.2 F (38.4 C) (08/29 0200) Temp Source: Oral (08/29 0200) BP: 138/89 (08/29 0300) Pulse Rate: 112 (08/29 0300)  Labs: Recent Labs    02/18/21 1905 02/18/21 2140 02/18/21 2254  HGB 8.4*  --   --   HCT 30.0*  --   --   PLT 195  --   --   CREATININE 0.96  --   --   CKTOTAL  --   --  127  TROPONINIHS 23* 31*  --     Estimated Creatinine Clearance: 39.2 mL/min (by C-G formula based on SCr of 0.96 mg/dL).   Medical History: Past Medical History:  Diagnosis Date   Anemia    Anxiety    Arthritis    Atrial fibrillation (HCC)    C. difficile colitis 09/2015   COPD (chronic obstructive pulmonary disease) (HCC)    Depression    Dyspnea    Dysrhythmia    Fibromyalgia    H/O tracheostomy    Hep C w/ coma, chronic    Hypertension    MRSA pneumonia (HCC) 2017   On home oxygen therapy    3 L/M    Osteoporosis    Peripheral neuropathy    RLS (restless legs syndrome)    S/P percutaneous endoscopic gastrostomy (PEG) tube placement (HCC) 09/2015   Ventilator associated pneumonia (HCC) 10/2015   [facility], [place]    Medications:  PTA Meds: Apixaban 5mg  BID  Assessment: Pt is 70 yo w/ history of A fib, presenting to ED with acute on chronic combined hypoxic and hypercapnic respiratory failure.   Goal of Therapy:  Anti-Xa level 0.6-1 units/ml 4hrs after LMWH dose given Monitor platelets by anticoagulation protocol: Yes   Plan:  Ordered Lovenox 1 mg/kg q12h. Will check CBC and SCr at least every 3 days Will check peak anti-Xa after 4th dose.  66, PharmD, [facility] 02/19/2021 3:57 AM

## 2021-02-19 NOTE — Plan of Care (Signed)

## 2021-02-19 NOTE — Progress Notes (Signed)
Initial Nutrition Assessment  DOCUMENTATION CODES:  Non-severe (moderate) malnutrition in context of acute illness/injury  INTERVENTION:  Advance diet as able once weaned to trach collar If unable to advance, recommend placing NGT and starting TF as pt has lost weight since last admission and muscle/fat depletions have worsened If NGT placed, recommend Osmolite 1.2 @ 17mL/h (1.2L total volume) + 1 packet of prosource TF (40kcal and 11g of protein). 34mL free water q4h to maintain tube patency. This will provide 1480 kcal, 78g of protein, and of free water    NUTRITION DIAGNOSIS:  Moderate Malnutrition related to acute illness as evidenced by mild fat depletion, mild muscle depletion.  GOAL:  Provide needs based on ASPEN/SCCM guidelines  MONITOR:  Vent status, Diet advancement, Labs, Weight trends  REASON FOR ASSESSMENT:  Ventilator, Malnutrition Screening Tool    ASSESSMENT:  70 y.o. 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 fib, fibromyalgia, HTN, osteoporosis, and Hep C brought to ED via EMS from home with hypoxia after a fall at home.   Pt's son reports that pt lives alone. He and his wife assist with placing pt on trilogy at night but they were recently dx with COVID19 and were unable to visit pt to assist with placing the machine. Pt went without for several days. Pt found by home health worker in the morning after a fall the night before. EMS was called to house multiple times the day of admission, p refused to go until saturations were found to be in the 50s.   Noted that pt previously had PEG tube, but it was removed 08/2020.  Pt resting in bed at the time of visit. Did not open eyes during assessment or answer questions, but did nod head at times. Pt seen by nutrition services in the past and recently followed during 7/16-7/21 admission. Weight loss of 4% noted since last admission (~1 month) and more depleted on exam.   Patient is currently on  ventilator support via trach. Discussed in rounds, hopefully to wean to trach collar today. NGT was attempted last night x2 for medications but placement was unsuccessful. Will be attempted again if unable to wean to trach collar and administer PO medications.   MV: 8.2 L/min Temp (24hrs), Avg:100 F (37.8 C), Min:98.9 F (37.2 C), Max:101.2 F (38.4 C)   Intake/Output Summary (Last 24 hours) at 02/19/2021 1342 Last data filed at 02/19/2021 0500 Gross per 24 hour  Intake 237.07 ml  Output 600 ml  Net -362.93 ml   Net IO Since Admission: -362.93 mL [02/19/21 1342]  Nutritionally Relevant Medications: Scheduled Meds:  insulin aspart  0-9 Units Subcutaneous Q4H   methylPREDNISolone (SOLU-MEDROL) injection  40 mg Intravenous Q12H   pantoprazole (PROTONIX) IV  40 mg Intravenous QHS   Continuous Infusions:  ceFEPime (MAXIPIME) IV     sodium phosphate  Dextrose 5% IVPB 45 mmol (02/19/21 0849)   PRN Meds: docusate sodium, polyethylene glycol  Labs Reviewed: Phosphorus 1.1  NUTRITION - FOCUSED PHYSICAL EXAM: Flowsheet Row Most Recent Value  Orbital Region Moderate depletion  Upper Arm Region Mild depletion  Thoracic and Lumbar Region Mild depletion  Buccal Region Moderate depletion  Temple Region Moderate depletion  Clavicle Bone Region Mild depletion  Clavicle and Acromion Bone Region Moderate depletion  Scapular Bone Region Moderate depletion  Dorsal Hand Mild depletion  Patellar Region Mild depletion  Anterior Thigh Region Mild depletion  Posterior Calf Region Mild depletion  Edema (RD Assessment) Mild  [  lower legs]  Hair Reviewed  Eyes Reviewed  Mouth Reviewed  Skin Reviewed  Nails Reviewed   Diet Order:   Diet Order             Diet NPO time specified  Diet effective now                   EDUCATION NEEDS:  No education needs have been identified at this time  Skin:  Skin Assessment: Reviewed RN Assessment  Last BM:  unknown  Height:  Ht Readings  from Last 1 Encounters:  02/18/21 5' (1.524 m)   Weight:  Wt Readings from Last 1 Encounters:  02/19/21 55.6 kg    Ideal Body Weight:  45.5 kg  BMI:  Body mass index is 23.94 kg/m.  Estimated Nutritional Needs:  Kcal:  1418 kcal Protein:  70-85 g/d Fluid:  1.6-1.8 L/d   Greig Castilla, RD, LDN Clinical Dietitian Pager on Amion

## 2021-02-19 NOTE — Progress Notes (Signed)
NAME:  Virginia [name], MRN:  [id number], DOB:  1950-11-25, LOS: 1 ADMISSION DATE:  02/18/2021, CONSULTATION DATE:  02/18/21 REFERRING MD:  Dr. Kerman Passey, CHIEF COMPLAINT:  Shortness of breath   History of Present Illness:  70 year old female presented to [facility] ED via EMS on 02/18/2021 with acute on chronic combined hypoxic and hypercapnic respiratory failure.   Per son, Virginia [name], patient lives at home alone and is assisted daily by himself and his wife as well as a home health nurse who comes out 3 times a week.  Virginia [name] reports it is he and his wife who help put her on her trilogy machine nightly.  The patient normally uses the trilogy as a CPAP mask with the tracheostomy capped overnight and is on 4.5 to 5 L nasal cannula during the day.   The patient's son reported that he and wife came down with COVID-19 last Wednesday, 02/14/2021, and have not been available to help the patient use this machine at night since 02/13/21, and so she has gone without it. Virginia [name] stated that his mother and her home health nurse reported to him that she has had multiple falls. He stated that yesterday, on 02/17/21, she fell in the bathroom and was stranded on the floor from 3 am until the home helper arrived hours later. The son, daughter-in-law and home health were concerned about the patient's respiratory status, specifically possible CO2 retention, and EMS was called. The first 2 times the patient refused to go with EMS. The third time however the patient's SpO2 was reading in the 50's with a good waveform, she was placed on CPAP and brought to the ED for evaluation.   PCCM consulted to admit to ICU as patient is requiring mechanical ventilation support.  The patient's mentation has improved but PCO2 after 1 hour is 95.  Pertinent  Medical History  Atrial Fibrillation on Eliquis C. Difficile Severe COPD Chronic tracheostomy Anxiety & depression Hepatitis  C Arthritis HTN Fibromyalgia Anemia CAD Thoracic AAA Significant Hospital Events: Including procedures, antibiotic start and stop dates in addition to other pertinent events   02/18/21: Admit to ICU with acute on chronic combined hypoxic/hypercapnic respiratory failure with chronic tracheostomy requiring mechanical ventilation 8/29 severe copd exacerbation  Interim History / Subjective:  Remains lethargic On vent Severe resp distress +hypercapnia and hypoxia +tracheal secretions mucopurulent   Objective   Blood pressure (!) 140/92, pulse 96, temperature 100 F (37.8 C), temperature source Oral, resp. rate 20, height 5' (1.524 m), weight 55.6 kg, SpO2 100 %.    Vent Mode: PRVC FiO2 (%):  [35 %-100 %] 35 % Set Rate:  [16 bmp-20 bmp] 16 bmp Vt Set:  [400 mL] 400 mL PEEP:  [5 cmH20] 5 cmH20 Plateau Pressure:  [12 cmH20-17 cmH20] 17 cmH20   Intake/Output Summary (Last 24 hours) at 02/19/2021 0723 Last data filed at 02/19/2021 0500 Gross per 24 hour  Intake 237.07 ml  Output 600 ml  Net -362.93 ml   Filed Weights   02/18/21 2254 02/18/21 2259 02/19/21 0426  Weight: 54.3 kg 54.3 kg 55.6 kg   REVIEW OF SYSTEMS  PATIENT IS UNABLE TO PROVIDE COMPLETE REVIEW OF SYSTEMS DUE TO SEVERE CRITICAL ILLNESS    PHYSICAL EXAMINATION:  GENERAL:critically ill appearing, +resp distress on vent EYES: Pupils equal, round, reactive to light.  No scleral icterus.  MOUTH: Moist mucosal membrane. S/p TRACH NECK: Supple.  PULMONARY: +rhonchi, +wheezing CARDIOVASCULAR: S1 and S2.  No murmurs  GASTROINTESTINAL: Soft, nontender, -distended. Positive  bowel sounds.  MUSCULOSKELETAL: No swelling, clubbing, or edema.  NEUROLOGIC:lethargic SKIN:intact,warm,dry     Assessment & Plan:   70 yo chronic vent dependant hypoxic and hypercapnic respiratorory failure with severe acute hypoxia and acute hypercapnic resp failure due to severe end stage COPD exacerbation due to severe pneumonia  Severe  ACUTE Hypoxic and Hypercapnic Respiratory Failure Acute on Chronic combined Hypoxic/ Hypercapnic Respiratory Failure secondary to COPD exacerbation & possible mucus plug? PMHx: Severe COPD, chronic tracheostomy (4.5-5 L [place] day & trilogy CPAP with capped trach at night) -continue Mechanical Ventilator support -continue Bronchodilator Therapy -Wean Fio2 and PEEP as tolerated -VAP/VENT bundle implementation -will perform SAT/SBT when respiratory parameters are met - Ensure adequate pulmonary hygiene     SEVERE COPD EXACERBATION -continue IV steroids as prescribed -continue NEB THERAPY as prescribed -morphine as needed -wean fio2 as needed and tolerated  ACUTE CARDIAC FAILURE-  Suspected SVT vs A-fib RVR s/p adenosine Mildly Elevated troponin secondary to suspected demand ischemia: 23 > 31 PMHx: PAF on Eliquis, CAD   NEUROLOGY Acute Encephalopathy suspected to be metabolic secondary to hypercapnia in the setting of COPD exacerbation - hold sedating medications as patient's mentation clears with ventilatory support - valium PRN continued for anxiety    ENDO - ICU hypoglycemic\Hyperglycemia protocol -check FSBS per protocol Risk for Steroid Induced Hyperglycemia - Monitor CBG Q 4 hours - SSI sensitive dosing - target range while in ICU: 140-180 - follow ICU hyper/hypo-glycemia protocol   ELECTROLYTES -follow labs as needed -replace as needed -pharmacy consultation and following  Best Practice (right click and "Reselect all SmartList Selections" daily)  Diet/type: NPO DVT prophylaxis: LMWH GI prophylaxis: PPI Lines: N/A Foley:  N/A Code Status:  full code   Labs   CBC: Recent Labs  Lab 02/18/21 1905 02/19/21 0600  WBC 12.8* 8.4  HGB 8.4* 8.1*  HCT 30.0* 27.6*  MCV 108.3* 106.2*  PLT 195 189     Basic Metabolic Panel: Recent Labs  Lab 02/18/21 1905 02/19/21 0600  NA 143 142  K 5.1 4.2  CL 90* 92*  CO2 43* 38*  GLUCOSE 126* 129*  BUN 15 17   CREATININE 0.96 0.94  CALCIUM 9.0 9.0  MG  --  2.0  PHOS  --  1.1*    GFR: Estimated Creatinine Clearance: 43.5 mL/min (by C-G formula based on SCr of 0.94 mg/dL). Recent Labs  Lab 02/18/21 1905 02/19/21 0600  PROCALCITON 0.98  --   WBC 12.8* 8.4     Liver Function Tests: Recent Labs  Lab 02/18/21 1905  AST 47*  ALT 26  ALKPHOS 22*  BILITOT 1.4*  PROT 6.7  ALBUMIN 3.9    No results for input(s): LIPASE, AMYLASE in the last 168 hours. No results for input(s): AMMONIA in the last 168 hours.  ABG    Component Value Date/Time   PHART 7.56 (H) 02/19/2021 0500   PCO2ART 46 02/19/2021 0500   PO2ART 88 02/19/2021 0500   HCO3 41.2 (H) 02/19/2021 0500   O2SAT 97.9 02/19/2021 0500      Coagulation Profile: Recent Labs  Lab 02/19/21 0600  INR 1.2    Cardiac Enzymes: Recent Labs  Lab 02/18/21 2254  CKTOTAL 127      DVT/GI PRX  assessed I Assessed the need for Labs I Assessed the need for Foley I Assessed the need for Central Venous Line Family Discussion when available I Assessed the need for Mobilization I made an Assessment of medications to be adjusted accordingly Safety Risk  assessment completed  CASE DISCUSSED IN MULTIDISCIPLINARY ROUNDS WITH ICU TEAM     Critical Care Time devoted to patient care services described in this note is 55 minutes.  Critical care was necessary to treat /prevent imminent and life-threatening deterioration. Overall, patient is critically ill, prognosis is guarded.  Patient with Multiorgan failure and at high risk for cardiac arrest and death.    Corrin Parker, M.D.  [facility]  Medical Director [place] Director [facility] Cardio-Pulmonary Department

## 2021-02-19 NOTE — Progress Notes (Signed)
Pharmacy Antibiotic Note  Virginia [name] is a 70 y.o. female admitted on 02/18/2021 with COPD exacerbation with pseudomonas risk factors.  Pharmacy has been consulted for Cefepime dosing.  Plan: Cefepime 2 gm q12h per indication and renal fxn.  Pharmacy will continue to follow and adjust dose when warranted.  Height: 5' (152.4 cm) Weight: 54.3 kg (119 lb 11.4 oz) IBW/kg (Calculated) : 45.5  Temp (24hrs), Avg:100.1 F (37.8 C), Min:98.9 F (37.2 C), Max:101.2 F (38.4 C)  Recent Labs  Lab 02/18/21 1905  WBC 12.8*  CREATININE 0.96    Estimated Creatinine Clearance: 39.2 mL/min (by C-G formula based on SCr of 0.96 mg/dL).    No Known Allergies  Antimicrobials this admission: 8/29 Cefepime >>   Microbiology results: 8/28 Resp Cx & Panel:  Pending  Thank you for allowing pharmacy to be a part of this patient's care.  Otelia Sergeant, PharmD, [facility] 02/19/2021 3:44 AM

## 2021-02-19 NOTE — Progress Notes (Signed)
Chaplain Maggie made initial visitation to introduce pastoral care and assess patient need. Patient is attempting to communicate although it is difficult to discern. Chaplain offered ministry of presence and silent prayer at bedside.

## 2021-02-20 ENCOUNTER — Inpatient Hospital Stay: Payer: Medicare Other

## 2021-02-20 DIAGNOSIS — Z7189 Other specified counseling: Secondary | ICD-10-CM | POA: Diagnosis not present

## 2021-02-20 DIAGNOSIS — J9622 Acute and chronic respiratory failure with hypercapnia: Secondary | ICD-10-CM | POA: Diagnosis not present

## 2021-02-20 DIAGNOSIS — J9621 Acute and chronic respiratory failure with hypoxia: Secondary | ICD-10-CM | POA: Diagnosis not present

## 2021-02-20 LAB — BASIC METABOLIC PANEL
Anion gap: 11 (ref 5–15)
BUN: 20 mg/dL (ref 8–23)
CO2: 40 mmol/L — ABNORMAL HIGH (ref 22–32)
Calcium: 8.4 mg/dL — ABNORMAL LOW (ref 8.9–10.3)
Chloride: 94 mmol/L — ABNORMAL LOW (ref 98–111)
Creatinine, Ser: 0.85 mg/dL (ref 0.44–1.00)
GFR, Estimated: >60 mL/min (ref >60)
Glucose, Bld: 170 mg/dL — ABNORMAL HIGH (ref 70–99)
Potassium: 3.2 mmol/L — ABNORMAL LOW (ref 3.5–5.1)
Sodium: 145 mmol/L (ref 135–145)

## 2021-02-20 LAB — CBC WITH DIFFERENTIAL/PLATELET
Abs Immature Granulocytes: 0.03 10*3/uL (ref 0.00–0.07)
Basophils Absolute: 0 10*3/uL (ref 0.0–0.1)
Basophils Relative: 0 %
Eosinophils Absolute: 0 10*3/uL (ref 0.0–0.5)
Eosinophils Relative: 0 %
HCT: 24.3 % — ABNORMAL LOW (ref 36.0–46.0)
Hemoglobin: 7.6 g/dL — ABNORMAL LOW (ref 12.0–15.0)
Immature Granulocytes: 0 %
Lymphocytes Relative: 4 %
Lymphs Abs: 0.4 10*3/uL — ABNORMAL LOW (ref 0.7–4.0)
MCH: 31.5 pg (ref 26.0–34.0)
MCHC: 31.3 g/dL (ref 30.0–36.0)
MCV: 100.8 fL — ABNORMAL HIGH (ref 80.0–100.0)
Monocytes Absolute: 0.2 10*3/uL (ref 0.1–1.0)
Monocytes Relative: 2 %
Neutro Abs: 9.3 10*3/uL — ABNORMAL HIGH (ref 1.7–7.7)
Neutrophils Relative %: 94 %
Platelets: 155 10*3/uL (ref 150–400)
RBC: 2.41 MIL/uL — ABNORMAL LOW (ref 3.87–5.11)
RDW: 15.1 % (ref 11.5–15.5)
WBC: 9.9 10*3/uL (ref 4.0–10.5)
nRBC: 0 % (ref 0.0–0.2)

## 2021-02-20 LAB — MAGNESIUM: Magnesium: 2 mg/dL (ref 1.7–2.4)

## 2021-02-20 LAB — GLUCOSE, CAPILLARY
Glucose-Capillary: 104 mg/dL — ABNORMAL HIGH (ref 70–99)
Glucose-Capillary: 114 mg/dL — ABNORMAL HIGH (ref 70–99)
Glucose-Capillary: 149 mg/dL — ABNORMAL HIGH (ref 70–99)
Glucose-Capillary: 94 mg/dL (ref 70–99)
Glucose-Capillary: 97 mg/dL (ref 70–99)
Glucose-Capillary: 98 mg/dL (ref 70–99)

## 2021-02-20 LAB — PROCALCITONIN: Procalcitonin: 0.66 ng/mL

## 2021-02-20 LAB — PHOSPHORUS: Phosphorus: 3.8 mg/dL (ref 2.5–4.6)

## 2021-02-20 MED ORDER — MORPHINE SULFATE (PF) 2 MG/ML IV SOLN
2.0000 mg | Freq: Once | INTRAVENOUS | Status: AC
Start: 2021-02-20 — End: 2021-02-20
  Administered 2021-02-20: 2 mg via INTRAVENOUS
  Filled 2021-02-20: qty 1

## 2021-02-20 MED ORDER — POTASSIUM CHLORIDE 10 MEQ/100ML IV SOLN
10.0000 meq | INTRAVENOUS | Status: AC
  Administered 2021-02-20 (×5): 10 meq via INTRAVENOUS
  Filled 2021-02-20 (×6): qty 100

## 2021-02-20 MED ORDER — METHYLPREDNISOLONE SODIUM SUCC 40 MG IJ SOLR
20.0000 mg | Freq: Two times a day (BID) | INTRAMUSCULAR | Status: DC
  Administered 2021-02-20 – 2021-02-23 (×6): 20 mg via INTRAVENOUS
  Filled 2021-02-20 (×6): qty 1

## 2021-02-20 MED ORDER — MORPHINE SULFATE (PF) 2 MG/ML IV SOLN: 2.0000 mg | INTRAVENOUS | Status: DC | PRN

## 2021-02-20 NOTE — Consult Note (Signed)
Consultation Note Date: 02/20/2021   Patient Name: Virginia [name]  DOB: [birth year]  MRN: [id number]  Age / Sex: 70 y.o., female  PCP: Marguarite Arbour, MD Referring Physician: Erin Fulling, MD  Reason for Consultation: Establishing goals of care  HPI/Patient Profile: 70 year old female presented to [facility] ED via EMS on 02/18/2021 with acute on chronic combined hypoxic and hypercapnic respiratory failure.   Per son, Virginia [name], patient lives at home alone and is assisted daily by himself and his wife as well as a home health nurse who comes out 3 times a week.  Virginia [name] reports it is he and his wife who help put her on her trilogy machine nightly.  The patient normally uses the trilogy as a CPAP mask with the tracheostomy capped overnight and is on 4.5 to 5 L nasal cannula during the day.   Clinical Assessment and Goals of Care: Patient is resting in bed on ventilator through established tracheostomy. She is known to PMT team. She lives alone at baseline with trilogy at night and TC during the day. She advises her family was not able to come and hook her up to trilogy, and she was admitted with CO2 of 95.   She is able to mouth words. She is hopeful to be able to transition from ventilator to trach collar with PMV soon so she can speak.   She states she wants to continue all care at this time without restriction.      SUMMARY OF RECOMMENDATIONS   Full code/full scope. Her PMV is at home.       Primary Diagnoses: Present on Admission:  AF (paroxysmal atrial fibrillation) (HCC)  Anemia of chronic disease  Acute metabolic encephalopathy  Acute on chronic respiratory failure with hypoxia and hypercapnia (HCC)   I have reviewed the medical record, interviewed the patient and family, and examined the patient. The following aspects are pertinent.  Past Medical History:  Diagnosis Date   Anemia    Anxiety     Arthritis    Atrial fibrillation (HCC)    C. difficile colitis 09/2015   COPD (chronic obstructive pulmonary disease) (HCC)    Depression    Dyspnea    Dysrhythmia    Fibromyalgia    H/O tracheostomy    Hep C w/ coma, chronic    Hypertension    MRSA pneumonia (HCC) 2017   On home oxygen therapy    3 L/M    Osteoporosis    Peripheral neuropathy    RLS (restless legs syndrome)    S/P percutaneous endoscopic gastrostomy (PEG) tube placement (HCC) 09/2015   Ventilator associated pneumonia (HCC) 10/2015   [facility] [place]   Social History   Socioeconomic History   Marital status: Single    Spouse name: Not on file   Number of children: Not on file   Years of education: Not on file   Highest education level: Not on file  Occupational History   Not on file  Tobacco Use   Smoking status: Former  Packs/day: 1.50    Types: Cigarettes    Quit date: 10/05/2015    Years since quitting: 5.3   Smokeless tobacco: Never  Vaping Use   Vaping Use: Some days   Last attempt to quit: 10/05/2015  Substance and Sexual Activity   Alcohol use: No   Drug use: No   Sexual activity: Not on file  Other Topics Concern   Not on file  Social History Narrative   Not on file   Social Determinants of Health   Financial Resource Strain: Not on file  Food Insecurity: Not on file  Transportation Needs: Not on file  Physical Activity: Not on file  Stress: Not on file  Social Connections: Not on file   Family History  Problem Relation Age of Onset   Hypertension Mother    Scheduled Meds:  Chlorhexidine Gluconate Cloth  6 each Topical QHS   enoxaparin (LOVENOX) injection  1 mg/kg Subcutaneous Q12H   insulin aspart  0-9 Units Subcutaneous Q4H   ipratropium-albuterol  3 mL Nebulization Q6H   methylPREDNISolone (SOLU-MEDROL) injection  20 mg Intravenous Q12H   metoprolol tartrate  5 mg Intravenous Q6H   pantoprazole (PROTONIX) IV  40 mg Intravenous QHS   Continuous  Infusions:  ceFEPime (MAXIPIME) IV Stopped (02/20/21 1104)   PRN Meds:.diazepam, docusate sodium, ipratropium-albuterol, morphine injection, polyethylene glycol Medications Prior to Admission:  Prior to Admission medications   Medication Sig Start Date End Date Taking? Authorizing Provider  albuterol (VENTOLIN HFA) 108 (90 Base) MCG/ACT inhaler Inhale 2 puffs into the lungs every 4 (four) hours as needed for wheezing or shortness of breath.   Yes [provider]  amitriptyline (ELAVIL) 25 MG tablet Take 25 mg by mouth at bedtime.  01/14/19  Yes [provider]  apixaban (ELIQUIS) 5 MG TABS tablet Take 1 tablet (5 mg total) by mouth 2 (two) times daily. 11/10/16  Yes Mody, Sital, MD  budesonide (PULMICORT) 0.5 MG/2ML nebulizer solution Take 0.5 mg by nebulization daily. 11/28/20  Yes [provider]  diazepam (VALIUM) 5 MG tablet Take 0.5 tablets (2.5 mg total) by mouth every 8 (eight) hours as needed for anxiety. 08/10/20  Yes Lurene Shadow, MD  diltiazem (CARDIZEM CD) 120 MG 24 hr capsule Take 120 mg by mouth daily. 04/29/19  Yes [provider]  estradiol (ESTRACE) 1 MG tablet Take 1 mg by mouth daily. 09/10/16  Yes [provider]  gabapentin (NEURONTIN) 300 MG capsule Take 300 mg by mouth 3 (three) times daily. 05/06/20  Yes [provider]  ipratropium-albuterol (DUONEB) 0.5-2.5 (3) MG/3ML SOLN Take 3 mLs by nebulization every 6 (six) hours as needed. 08/10/20  Yes Lurene Shadow, MD  iron polysaccharides (NIFEREX) 150 MG capsule Take 1 capsule (150 mg total) by mouth daily. 01/12/21 04/12/21 Yes Gillis Santa, MD  MAGNESIUM-OXIDE 400 (241.3 Mg) MG tablet Take 400 mg by mouth daily.  08/24/16  Yes [provider]  methocarbamol (ROBAXIN) 500 MG tablet Take 500 mg by mouth 2 (two) times daily. 12/27/20  Yes [provider]  metoprolol tartrate (LOPRESSOR) 50 MG tablet Take 50 mg by mouth 2 (two) times daily. 04/23/20  Yes [provider]  Multiple Vitamin (MULTIVITAMIN) tablet Take 1 tablet by mouth daily.   Yes [provider]  omeprazole (PRILOSEC) 40 MG capsule Take 40 mg by mouth daily. 04/16/19  Yes [provider]  predniSONE (DELTASONE) 5 MG tablet Take 5 mg by mouth daily. 12/25/20  Yes [provider]  QUEtiapine (SEROQUEL) 25 MG tablet Take 25 mg by mouth at bedtime.  10/01/16  Yes [provider]  rosuvastatin (CRESTOR) 10 MG tablet Take 10 mg by mouth at bedtime. 10/23/20  Yes [provider]  TRELEGY ELLIPTA 100-62.5-25 MCG/INH AEPB Inhale 1 puff into the lungs daily. 06/04/20  Yes [provider]  vitamin B-12 100 MCG tablet Take 1 tablet (100 mcg total) by mouth daily. 01/12/21 04/12/21 Yes Gillis Santa, MD  Vitamin D, Ergocalciferol, (DRISDOL) 1.25 MG (50000 UNIT) CAPS capsule Take 1 capsule (50,000 Units total) by mouth every 7 (seven) days. 01/17/21 04/17/21 Yes Gillis Santa, MD  furosemide (LASIX) 20 MG tablet Take 20 mg by mouth daily.  Patient not taking: No sig reported 10/01/16   [provider]  lisinopril (ZESTRIL) 10 MG tablet Take 10 mg by mouth daily. Patient not taking: No sig reported    [provider]   No Known Allergies Review of Systems  All other systems reviewed and are negative.  Physical Exam Pulmonary:     Comments: On vent through trach.  Neurological:     Mental Status: She is alert.    Vital Signs: BP (!) 146/118   Pulse 76   Temp 99 F (37.2 C) (Oral)   Resp 20   Ht 5' (1.524 m)   Wt 55.7 kg   SpO2 96%   BMI 23.98 kg/m  Pain Scale: 0-10   Pain Score: 0-No pain   SpO2: SpO2: 96 % O2 Device:SpO2: 96 % O2 Flow Rate: .O2 Flow Rate (L/min): 10 L/min  IO: Intake/output summary:  Intake/Output Summary (Last 24 hours) at 02/20/2021 1536 Last data filed at 02/20/2021 1200 Gross per 24 hour  Intake 611.67 ml  Output 1450 ml  Net -838.33 ml    LBM: Last BM Date: 02/20/21 Baseline  Weight: Weight: 54.3 kg Most recent weight: Weight: 55.7 kg      Time In: 3:10 Time Out: 3:45 Time Total: 35 min Greater than 50%  of this time was spent counseling and coordinating care related to the above assessment and plan.  Signed by: Morton Stall, NP   Please contact Palliative Medicine Team phone at [phone] for questions and concerns.  For individual provider: See Loretha Stapler

## 2021-02-20 NOTE — Progress Notes (Signed)
Noted ST eval order. Will folllow and assess once not mechanically ventilated and on trach collar consistently.

## 2021-02-20 NOTE — Progress Notes (Signed)
NAME:  RIATA IKEDA, MRN:  [id number], DOB:  [birth year], LOS: 2 ADMISSION DATE:  02/18/2021, CONSULTATION DATE:  02/18/21 REFERRING MD:  Dr. Kerman Passey, CHIEF COMPLAINT:  Shortness of breath   History of Present Illness:  70 year old female presented to [facility] ED via EMS on 02/18/2021 with acute on chronic combined hypoxic and hypercapnic respiratory failure.   Per son, Jezebelle Ledwell, patient lives at home alone and is assisted daily by himself and his wife as well as a home health nurse who comes out 3 times a week.  [name] reports it is he and his wife who help put her on her trilogy machine nightly.  The patient normally uses the trilogy as a CPAP mask with the tracheostomy capped overnight and is on 4.5 to 5 L nasal cannula during the day.   The patient's son reported that he and wife came down with COVID-19 last Wednesday, 02/14/2021, and have not been available to help the patient use this machine at night since 02/13/21, and so she has gone without it. Judiann Celia stated that his mother and her home health nurse reported to him that she has had multiple falls. He stated that yesterday, on 02/17/21, she fell in the bathroom and was stranded on the floor from 3 am until the home helper arrived hours later. The son, daughter-in-law and home health were concerned about the patient's respiratory status, specifically possible CO2 retention, and EMS was called. The first 2 times the patient refused to go with EMS. The third time however the patient's SpO2 was reading in the 50's with a good waveform, she was placed on CPAP and brought to the ED for evaluation.   PCCM consulted to admit to ICU as patient is requiring mechanical ventilation support.  The patient's mentation has improved but PCO2 after 1 hour is 95.  Pertinent  Medical History  Atrial Fibrillation on Eliquis C. Difficile Severe COPD Chronic tracheostomy Anxiety & depression Hepatitis  C Arthritis HTN Fibromyalgia Anemia CAD Thoracic AAA Significant Hospital Events: Including procedures, antibiotic start and stop dates in addition to other pertinent events   02/18/21: Admit to ICU with acute on chronic combined hypoxic/hypercapnic respiratory failure with chronic tracheostomy requiring mechanical ventilation 8/29 severe copd exacerbation 8/30 did NOT tolerate PS mode, plan for PS mode today  Interim History / Subjective:  On vent Severe resp failure Unable to wean off vent in the day +COPD exacerbation Remains lethargic +hypercapnia and hypoxia +tracheal secretions mucopurulent   Objective   Blood pressure 132/88, pulse 90, temperature 99.4 F (37.4 C), temperature source Oral, resp. rate (!) 22, height 5' (1.524 m), weight 55.7 kg, SpO2 97 %.    Vent Mode: PRVC FiO2 (%):  [35 %] 35 % Set Rate:  [16 bmp] 16 bmp Vt Set:  [400 mL] 400 mL PEEP:  [5 cmH20] 5 cmH20 Pressure Support:  [15 cmH20-20 cmH20] 20 cmH20 Plateau Pressure:  [22 cmH20] 22 cmH20   Intake/Output Summary (Last 24 hours) at 02/20/2021 0802 Last data filed at 02/20/2021 0506 Gross per 24 hour  Intake 450.06 ml  Output 800 ml  Net -349.94 ml    Filed Weights   02/18/21 2259 02/19/21 0426 02/20/21 0436  Weight: 54.3 kg 55.6 kg 55.7 kg   REVIEW OF SYSTEMS  PATIENT IS UNABLE TO PROVIDE COMPLETE REVIEW OF SYSTEMS DUE TO SEVERE CRITICAL ILLNESS AND TOXIC METABOLIC ENCEPHALOPATHY  PHYSICAL EXAMINATION:  GENERAL:critically ill appearing, +resp distress EYES: Pupils equal, round, reactive to light.  No scleral  icterus.  MOUTH: Moist mucosal membrane. S/p TRACH NECK: Supple.  PULMONARY: +rhonchi, +wheezing CARDIOVASCULAR: S1 and S2.  No murmurs  GASTROINTESTINAL: Soft, nontender, -distended. Positive bowel sounds.  MUSCULOSKELETAL: No swelling, clubbing, or edema.  NEUROLOGIC: obtunded SKIN:intact,warm,dry    Assessment & Plan:   70 yo chronic vent dependant hypoxic and  hypercapnic respiratorory failure with severe acute hypoxia and acute hypercapnic resp failure due to severe end stage COPD exacerbation due to severe pneumonia   Severe ACUTE Hypoxic and Hypercapnic Respiratory Failure -continue Mechanical Ventilator support -continue Bronchodilator Therapy -Wean Fio2 and PEEP as tolerated -VAP/VENT bundle implementation -will perform SAT/SBT when respiratory parameters are met    SEVERE COPD EXACERBATION Acute on Chronic combined Hypoxic/ Hypercapnic Respiratory Failure secondary to COPD exacerbation & possible mucus plug? -continue IV steroids as prescribed -continue NEB THERAPY as prescribed -morphine as needed -wean fio2 as needed and tolerated - Ensure adequate pulmonary hygiene    ACUTE CARDIAC FAILURE-  Suspected SVT vs A-fib RVR s/p adenosine Consider underlying ischemic cardiomyopathy   NEUROLOGY ACUTE TOXIC METABOLIC ENCEPHALOPATHY due to hypercapnia Acute Encephalopathy suspected to be metabolic secondary to hypercapnia in the setting of COPD exacerbation  ELECTROLYTES -follow labs as needed -replace as needed -pharmacy consultation and following   GI GI PROPHYLAXIS as indicated  NUTRITIONAL STATUS DIET-->TF's as tolerated Constipation protocol as indicated   Best Practice (right click and "Reselect all SmartList Selections" daily)  Diet/type: NPO DVT prophylaxis: LMWH GI prophylaxis: PPI Lines: N/A Foley:  N/A Code Status:  full code   Labs   CBC: Recent Labs  Lab 02/18/21 1905 02/19/21 0600 02/20/21 0355  WBC 12.8* 8.4 9.9  NEUTROABS  --   --  9.3*  HGB 8.4* 8.1* 7.6*  HCT 30.0* 27.6* 24.3*  MCV 108.3* 106.2* 100.8*  PLT 195 189 155     Basic Metabolic Panel: Recent Labs  Lab 02/18/21 1905 02/19/21 0600 02/19/21 1950 02/20/21 0355  NA 143 142  --  145  K 5.1 4.2  --  3.2*  CL 90* 92*  --  94*  CO2 43* 38*  --  40*  GLUCOSE 126* 129*  --  170*  BUN 15 17  --  20  CREATININE 0.96 0.94  --   0.85  CALCIUM 9.0 9.0  --  8.4*  MG  --  2.0  --  2.0  PHOS  --  1.1* 3.4 3.8    GFR: Estimated Creatinine Clearance: 48.2 mL/min (by C-G formula based on SCr of 0.85 mg/dL). Recent Labs  Lab 02/18/21 1905 02/19/21 0600 02/20/21 0355  PROCALCITON 0.98 1.16 0.66  WBC 12.8* 8.4 9.9     Liver Function Tests: Recent Labs  Lab 02/18/21 1905  AST 47*  ALT 26  ALKPHOS 22*  BILITOT 1.4*  PROT 6.7  ALBUMIN 3.9    No results for input(s): LIPASE, AMYLASE in the last 168 hours. No results for input(s): AMMONIA in the last 168 hours.  ABG    Component Value Date/Time   PHART 7.56 (H) 02/19/2021 0500   PCO2ART 46 02/19/2021 0500   PO2ART 88 02/19/2021 0500   HCO3 41.2 (H) 02/19/2021 0500   O2SAT 97.9 02/19/2021 0500      Coagulation Profile: Recent Labs  Lab 02/19/21 0600  INR 1.2     Cardiac Enzymes: Recent Labs  Lab 02/18/21 2254  CKTOTAL 127       DVT/GI PRX  assessed I Assessed the need for Labs I Assessed the need for Foley  I Assessed the need for Central Venous Line Family Discussion when available I Assessed the need for Mobilization I made an Assessment of medications to be adjusted accordingly Safety Risk assessment completed  CASE DISCUSSED IN MULTIDISCIPLINARY ROUNDS WITH ICU TEAM     Critical Care Time devoted to patient care services described in this note is 50 minutes.  Critical care was necessary to treat /prevent imminent and life-threatening deterioration. Overall, patient is critically ill, prognosis is guarded.     Corrin Parker, M.D.  [facility]  Medical Director [place] Director [facility] Cardio-Pulmonary Department

## 2021-02-20 NOTE — Progress Notes (Signed)
PHARMACY CONSULT NOTE  Pharmacy Consult for Electrolyte Monitoring and Replacement   Recent Labs: Potassium (mmol/L)  Date Value  02/20/2021 3.2 (L)   Magnesium (mg/dL)  Date Value  47/02/6282 2.0   Calcium (mg/dL)  Date Value  66/29/4765 8.4 (L)   Albumin (g/dL)  Date Value  46/50/3546 3.9   Phosphorus (mg/dL)  Date Value  56/81/2751 3.8   Sodium (mmol/L)  Date Value  02/20/2021 145     Assessment: 70 y.o. female with a past medical history of COPD on 3 L nasal cannula, anxiety, fibromyalgia, hypertension, atrial fibrillation, AAA presents to the emergency department for shortness of breath. Pharmacy is asked to follow and replace electrolytes while in the CCU.  Goal of Therapy:  Potassium 4.0 - 5.1 mmol/L Magnesium 2.0 - 2.4 mg/dL All Other Electrolytes WNL  Plan:  10 mEq IV Kcl x 6 per NP Re-check electrolytes in am  Lowella Bandy ,PharmD Clinical Pharmacist 02/20/2021 7:38 AM

## 2021-02-21 DIAGNOSIS — R140 Abdominal distension (gaseous): Secondary | ICD-10-CM

## 2021-02-21 DIAGNOSIS — Z7189 Other specified counseling: Secondary | ICD-10-CM | POA: Diagnosis not present

## 2021-02-21 DIAGNOSIS — J9621 Acute and chronic respiratory failure with hypoxia: Secondary | ICD-10-CM | POA: Diagnosis not present

## 2021-02-21 DIAGNOSIS — J9622 Acute and chronic respiratory failure with hypercapnia: Secondary | ICD-10-CM | POA: Diagnosis not present

## 2021-02-21 LAB — RENAL FUNCTION PANEL
Albumin: 2.7 g/dL — ABNORMAL LOW (ref 3.5–5.0)
Anion gap: 11 (ref 5–15)
BUN: 17 mg/dL (ref 8–23)
CO2: 37 mmol/L — ABNORMAL HIGH (ref 22–32)
Calcium: 8.5 mg/dL — ABNORMAL LOW (ref 8.9–10.3)
Chloride: 95 mmol/L — ABNORMAL LOW (ref 98–111)
Creatinine, Ser: 0.73 mg/dL (ref 0.44–1.00)
GFR, Estimated: >60 mL/min (ref >60)
Glucose, Bld: 112 mg/dL — ABNORMAL HIGH (ref 70–99)
Phosphorus: 2.9 mg/dL (ref 2.5–4.6)
Potassium: 3.5 mmol/L (ref 3.5–5.1)
Sodium: 143 mmol/L (ref 135–145)

## 2021-02-21 LAB — CBC
HCT: 23.7 % — ABNORMAL LOW (ref 36.0–46.0)
Hemoglobin: 7.5 g/dL — ABNORMAL LOW (ref 12.0–15.0)
MCH: 31.6 pg (ref 26.0–34.0)
MCHC: 31.6 g/dL (ref 30.0–36.0)
MCV: 100 fL (ref 80.0–100.0)
Platelets: 157 10*3/uL (ref 150–400)
RBC: 2.37 MIL/uL — ABNORMAL LOW (ref 3.87–5.11)
RDW: 15.2 % (ref 11.5–15.5)
WBC: 8.1 10*3/uL (ref 4.0–10.5)
nRBC: 0 % (ref 0.0–0.2)

## 2021-02-21 LAB — CULTURE, RESPIRATORY W GRAM STAIN

## 2021-02-21 LAB — GLUCOSE, CAPILLARY
Glucose-Capillary: 123 mg/dL — ABNORMAL HIGH (ref 70–99)
Glucose-Capillary: 97 mg/dL (ref 70–99)
Glucose-Capillary: 108 mg/dL — ABNORMAL HIGH (ref 70–99)
Glucose-Capillary: 99 mg/dL (ref 70–99)
Glucose-Capillary: 89 mg/dL (ref 70–99)
Glucose-Capillary: 82 mg/dL (ref 70–99)

## 2021-02-21 LAB — MAGNESIUM: Magnesium: 2.1 mg/dL (ref 1.7–2.4)

## 2021-02-21 MED ORDER — MORPHINE SULFATE (PF) 2 MG/ML IV SOLN
2.0000 mg | INTRAVENOUS | Status: DC | PRN
  Administered 2021-02-21 – 2021-02-23 (×10): 2 mg via INTRAVENOUS
  Filled 2021-02-21 (×10): qty 1

## 2021-02-21 NOTE — Progress Notes (Signed)
Patient switched from trach collar to being placed on vent for the night.  Vent settings are as follows:  PSV 5/5, 35%.  Patient appears to be tolerating well.  SPO2: 96%, HR: 90, RR: 20.

## 2021-02-21 NOTE — Consult Note (Signed)
[facility] SURGICAL CONSULTATION NOTE (initial) - cpt: 16109: [id number]   HISTORY OF PRESENT ILLNESS (HPI):  70 y.o. female [facility] on 08/28 for evaluation of SOB. Patient with history of COPD and tracheostomy who presented to the ED with worsening SOB s/p fall at home. She was found to be hypoxic to 50% on 5L  and was brought to the ED. Work up in the ED was concerning for acute on chronic respiratory failure in setting of likely COPD exacerbation and was admitted to ICU for ventilator support via tracheostomy. She reports that about 24 hours ago, she noticed upper abdominal discomfort, worse on the right with associated distension. She believes she has some nausea but this is minimal. Her last bowel movement was yesterday and she is reporting a few episodes of flatus. No emesis. Previous abdominal surgeries are positive for abdominal hysterectomy and PEG placement in 2021, although she has since removed this. No history of bowel obstruction in the pas.t She did under CT Abdomen/Pelvis yesterday which was concerning for partial SBO vs Ileus.   Surgery is consulted by PCCM physician Dr. Erin FullingKurian Kasa, MD in this context for evaluation and management of possible SBO vs Ileus.   PAST MEDICAL HISTORY (PMH):  Past Medical History:  Diagnosis Date   Anemia    Anxiety    Arthritis    Atrial fibrillation (HCC)    C. difficile colitis 09/2015   COPD (chronic obstructive pulmonary disease) (HCC)    Depression    Dyspnea    Dysrhythmia    Fibromyalgia    H/O tracheostomy    Hep C w/ coma, chronic    Hypertension    MRSA pneumonia (HCC) 2017   On home oxygen therapy    3 L/M    Osteoporosis    Peripheral neuropathy    RLS (restless legs syndrome)    S/P percutaneous endoscopic gastrostomy (PEG) tube placement (HCC) 09/2015   Ventilator associated pneumonia (HCC) 10/2015   [facility] [place]     PAST SURGICAL HISTORY (PSH):  Past Surgical History:   Procedure Laterality Date   ABDOMINAL HYSTERECTOMY     BREAST SURGERY Bilateral    Breast Implants   DILATION AND CURETTAGE OF UTERUS     PEG PLACEMENT N/A 06/21/2020   Procedure: PERCUTANEOUS ENDOSCOPIC GASTROSTOMY (PEG) PLACEMENT;  Surgeon: Regis BillLocklear, Cameron T, MD;  Location: [facility] ENDOSCOPY;  Service: Endoscopy;  Laterality: N/A;   REVERSE SHOULDER ARTHROPLASTY Right 03/06/2017   Procedure: REVERSE SHOULDER ARTHROPLASTY;  Surgeon: Christena FlakePoggi, John J, MD;  Location: [facility] ORS;  Service: Orthopedics;  Laterality: Right;   ROTATOR CUFF REPAIR Bilateral    TRACHEOSTOMY TUBE PLACEMENT N/A 06/15/2020   Procedure: TRACHEOSTOMY;  Surgeon: Geanie LoganBennett, Paul, MD;  Location: [facility] ORS;  Service: ENT;  Laterality: N/A;     MEDICATIONS:  Prior to Admission medications   Medication Sig Start Date End Date Taking? Authorizing Provider  albuterol (VENTOLIN HFA) 108 (90 Base) MCG/ACT inhaler Inhale 2 puffs into the lungs every 4 (four) hours as needed for wheezing or shortness of breath.   Yes [provider]  amitriptyline (ELAVIL) 25 MG tablet Take 25 mg by mouth at bedtime.  01/14/19  Yes [provider]  apixaban (ELIQUIS) 5 MG TABS tablet Take 1 tablet (5 mg total) by mouth 2 (two) times daily. 11/10/16  Yes Mody, Sital, MD  budesonide (PULMICORT) 0.5 MG/2ML nebulizer solution Take 0.5 mg by nebulization daily. 11/28/20  Yes [provider]  diazepam (VALIUM) 5  MG tablet Take 0.5 tablets (2.5 mg total) by mouth every 8 (eight) hours as needed for anxiety. 08/10/20  Yes Lurene Shadow, MD  diltiazem (CARDIZEM CD) 120 MG 24 hr capsule Take 120 mg by mouth daily. 04/29/19  Yes [provider]  estradiol (ESTRACE) 1 MG tablet Take 1 mg by mouth daily. 09/10/16  Yes [provider]  gabapentin (NEURONTIN) 300 MG capsule Take 300 mg by mouth 3 (three) times daily. 05/06/20  Yes [provider]  ipratropium-albuterol (DUONEB) 0.5-2.5 (3) MG/3ML SOLN Take 3 mLs by  nebulization every 6 (six) hours as needed. 08/10/20  Yes Lurene Shadow, MD  iron polysaccharides (NIFEREX) 150 MG capsule Take 1 capsule (150 mg total) by mouth daily. 01/12/21 04/12/21 Yes Gillis Santa, MD  MAGNESIUM-OXIDE 400 (241.3 Mg) MG tablet Take 400 mg by mouth daily.  08/24/16  Yes [provider]  methocarbamol (ROBAXIN) 500 MG tablet Take 500 mg by mouth 2 (two) times daily. 12/27/20  Yes [provider]  metoprolol tartrate (LOPRESSOR) 50 MG tablet Take 50 mg by mouth 2 (two) times daily. 04/23/20  Yes [provider]  Multiple Vitamin (MULTIVITAMIN) tablet Take 1 tablet by mouth daily.   Yes [provider]  omeprazole (PRILOSEC) 40 MG capsule Take 40 mg by mouth daily. 04/16/19  Yes [provider]  predniSONE (DELTASONE) 5 MG tablet Take 5 mg by mouth daily. 12/25/20  Yes [provider]  QUEtiapine (SEROQUEL) 25 MG tablet Take 25 mg by mouth at bedtime.  10/01/16  Yes [provider]  rosuvastatin (CRESTOR) 10 MG tablet Take 10 mg by mouth at bedtime. 10/23/20  Yes [provider]  TRELEGY ELLIPTA 100-62.5-25 MCG/INH AEPB Inhale 1 puff into the lungs daily. 06/04/20  Yes [provider]  vitamin B-12 100 MCG tablet Take 1 tablet (100 mcg total) by mouth daily. 01/12/21 04/12/21 Yes Gillis Santa, MD  Vitamin D, Ergocalciferol, (DRISDOL) 1.25 MG (50000 UNIT) CAPS capsule Take 1 capsule (50,000 Units total) by mouth every 7 (seven) days. 01/17/21 04/17/21 Yes Gillis Santa, MD  furosemide (LASIX) 20 MG tablet Take 20 mg by mouth daily.  Patient not taking: No sig reported 10/01/16   [provider]  lisinopril (ZESTRIL) 10 MG tablet Take 10 mg by mouth daily. Patient not taking: No sig reported    [provider]     ALLERGIES:  No Known Allergies   SOCIAL HISTORY:  Social History   Socioeconomic History   Marital status: Single    Spouse name: Not on file   Number of children: Not on  file   Years of education: Not on file   Highest education level: Not on file  Occupational History   Not on file  Tobacco Use   Smoking status: Former    Packs/day: 1.50    Types: Cigarettes    Quit date: 10/05/2015    Years since quitting: 5.3   Smokeless tobacco: Never  Vaping Use   Vaping Use: Some days   Last attempt to quit: 10/05/2015  Substance and Sexual Activity   Alcohol use: No   Drug use: No   Sexual activity: Not on file  Other Topics Concern   Not on file  Social History Narrative   Not on file   Social Determinants of Health   Financial Resource Strain: Not on file  Food Insecurity: Not on file  Transportation Needs: Not on file  Physical Activity: Not on file  Stress: Not on file  Social Connections: Not on file  Intimate Partner Violence: Not on file     FAMILY HISTORY:  Family History  Problem Relation Age of Onset   Hypertension Mother       REVIEW OF SYSTEMS:  Review of Systems  Constitutional:  Negative for chills and fever.  Respiratory:  Positive for shortness of breath (Improving). Negative for cough.   Cardiovascular:  Negative for chest pain and palpitations.  Gastrointestinal:  Positive for abdominal pain and nausea. Negative for blood in stool, constipation, diarrhea and vomiting.  Genitourinary:  Negative for dysuria and urgency.  All other systems reviewed and are negative.  VITAL SIGNS:  Temp:  [98.9 F (37.2 C)-99.2 F (37.3 C)] 99.2 F (37.3 C) (08/31 0300) Pulse Rate:  [68-120] 77 (08/31 0700) Resp:  [16-30] 20 (08/31 0700) BP: (115-164)/(64-118) 115/71 (08/31 0700) SpO2:  [95 %-100 %] 97 % (08/31 0700) FiO2 (%):  [35 %-40 %] 35 % (08/31 0300) Weight:  [53.9 kg] 53.9 kg (08/31 0500)     Height: 5' (152.4 cm) Weight: 53.9 kg BMI (Calculated): 23.21   INTAKE/OUTPUT:  08/30 0701 - 08/31 0700 In: 611.7 [IV Piggyback:611.7] Out: 1900 [Urine:1900]  PHYSICAL EXAM:  Physical Exam Vitals and nursing note reviewed.   Constitutional:      General: She is not in acute distress.    Appearance: Normal appearance. She is normal weight. She is not ill-appearing.  HENT:     Head: Normocephalic and atraumatic.  Eyes:     General: No scleral icterus.    Conjunctiva/sclera: Conjunctivae normal.  Neck:     Trachea: Tracheostomy present.  Cardiovascular:     Rate and Rhythm: Normal rate and regular rhythm.     Pulses: Normal pulses.  Pulmonary:     Effort: Pulmonary effort is normal. No respiratory distress.  Abdominal:     General: Abdomen is flat. A surgical scar is present. There is distension (Mild).     Palpations: Abdomen is soft.     Tenderness: There is no abdominal tenderness. There is no guarding or rebound.     Comments: Abdomen is soft, no significant tenderness, she is mildly distended and tympanic, no rebound/guarding. No evidence of peritonitis   Genitourinary:    Comments: Deferred Musculoskeletal:     Right lower leg: No edema.     Left lower leg: No edema.  Skin:    General: Skin is warm and dry.     Coloration: Skin is not pale.     Findings: No erythema.  Neurological:     General: No focal deficit present.     Mental Status: She is alert and oriented to person, place, and time.  Psychiatric:        Mood and Affect: Mood normal.        Behavior: Behavior normal.     Labs:  CBC Latest Ref Rng & Units 02/21/2021 02/20/2021 02/19/2021  WBC 4.0 - 10.5 K/uL 8.1 9.9 8.4  Hemoglobin 12.0 - 15.0 g/dL 7.5(L) 7.6(L) 8.1(L)  Hematocrit 36.0 - 46.0 % 23.7(L) 24.3(L) 27.6(L)  Platelets 150 - 400 K/uL 157 155 189   CMP Latest Ref Rng & Units 02/20/2021 02/19/2021 02/18/2021  Glucose 70 - 99 mg/dL 332(R) 518(A) 416(S)  BUN 8 - 23 mg/dL 20 17 15   Creatinine 0.44 - 1.00 mg/dL 0.63 0.16  Sodium 135 - 145 mmol/L 145 142 143  Potassium 3.5 - 5.1 mmol/L 3.2(L) 4.2 5.1  Chloride 98 - 111 mmol/L 94(L) 92(L) 90(L)  CO2 22 - 32 mmol/L 40(H) 38(H) 43(H)  Calcium 8.9 - 10.3 mg/dL 9.2(Z) 9.0 9.0   Total Protein 6.5 - 8.1 g/dL - - 6.7  Total Bilirubin 0.3 - 1.2 mg/dL - - 1.4(H)  Alkaline Phos 38 - 126 U/L - - 22(L)  AST 15 - 41 U/L - - 47(H)  ALT 0 - 44 U/L - - 26     Imaging studies:   CT Abdomen/Pelvis (02/20/2021) personally reviewed with a few loops of dilated bowel, question transition zone in the pelvis, but stomach is decompressed, and radiologist report reviewed:  IMPRESSION: 1. Mild fluid and air distension of mid to small bowel with possible transition point in the central pelvis suggesting partial or developing bowel obstruction over ileus. Distal small bowel and colon are relatively collapsed. 2. Small right-sided pleural effusion. Small amount of abdominopelvic ascites 3. Emphysema   Assessment/Plan: (ICD-10's: K75.609) 70 y.o. female admitted with acute on chronic respiratory failure in setting of likely COPD exacerbation requiring ventilator support found to have new abdominal distension concerning for pSBO vs ileus, favor ileus in setting of acute medical issues given her overall picture and signs of bowel function   - Would hold off on NGT decompression for now as she is still passing flatus and having no nausea/emesis with decompressed stomach on imaging. Should these symptoms develop, or patient worsen, then I would not hesitate to place NGT - Continue NPO for now - Monitor abdominal examination - Serial KUBs as needed    - Pain control prn (limit narcotics) - Okay to ambulate as toerlated - No need for surgical intervention    - Further management per primary service; we will follow   All of the above findings and recommendations were discussed with the patient, and all of patient's questions were answered to her expressed satisfaction.  Thank you for the opportunity to participate in this patient's care.   -- Lynden Oxford, PA-C [facility] 02/21/2021, 7:41 AM [phone] M-F: 7am - 4pm

## 2021-02-21 NOTE — Progress Notes (Signed)
PHARMACY CONSULT NOTE  Pharmacy Consult for Electrolyte Monitoring and Replacement   Recent Labs: Potassium (mmol/L)  Date Value  02/21/2021 3.5   Magnesium (mg/dL)  Date Value  54/65/6812 2.1   Calcium (mg/dL)  Date Value  75/17/0017 8.5 (L)   Albumin (g/dL)  Date Value  49/44/9675 2.7 (L)   Phosphorus (mg/dL)  Date Value  91/63/8466 2.9   Sodium (mmol/L)  Date Value  02/21/2021 143     Assessment: 70 y.o. female with a past medical history of COPD on 3 L nasal cannula, anxiety, fibromyalgia, hypertension, atrial fibrillation, AAA presents to the emergency department for shortness of breath. Pharmacy is asked to follow and replace electrolytes while in the CCU.  Goal of Therapy:  Potassium 4.0 - 5.1 mmol/L Magnesium 2.0 - 2.4 mg/dL All Other Electrolytes WNL  Plan:  K: 3.2>3.5 (after 8/30's run of 10 mEq IV Kcl x 6 per NP) No further repletion today. Mg/Phos remain WNL; No further repletion today. Re-check electrolytes in AM  Martyn Malay ,PharmD, [facility] Clinical Pharmacist 02/21/2021 10:17 AM

## 2021-02-21 NOTE — Progress Notes (Signed)
NAME:  Virginia [name], MRN:  [id number], DOB:  [birth year], LOS: 3 ADMISSION DATE:  02/18/2021, CONSULTATION DATE:  02/18/21 REFERRING MD:  Dr. Kerman Passey, CHIEF COMPLAINT:  Shortness of breath   History of Present Illness:  70 year old female presented to [facility] ED via EMS on 02/18/2021 with acute on chronic combined hypoxic and hypercapnic respiratory failure.   Per son, Virginia [name], patient lives at home alone and is assisted daily by himself and his wife as well as a home health nurse who comes out 3 times a week.  Virginia [name] reports it is he and his wife who help put her on her trilogy machine nightly.  The patient normally uses the trilogy as a CPAP mask with the tracheostomy capped overnight and is on 4.5 to 5 L nasal cannula during the day.   The patient's son reported that he and wife came down with COVID-19 last Wednesday, 02/14/2021, and have not been available to help the patient use this machine at night since 02/13/21, and so she has gone without it. Virginia [name] stated that his mother and her home health nurse reported to him that she has had multiple falls. He stated that yesterday, on 02/17/21, she fell in the bathroom and was stranded on the floor from 3 am until the home helper arrived hours later. The son, daughter-in-law and home health were concerned about the patient's respiratory status, specifically possible CO2 retention, and EMS was called. The first 2 times the patient refused to go with EMS. The third time however the patient's SpO2 was reading in the 50's with a good waveform, she was placed on CPAP and brought to the ED for evaluation.   PCCM consulted to admit to ICU as patient is requiring mechanical ventilation support.  The patient's mentation has improved but PCO2 after 1 hour is 95.  Pertinent  Medical History  Atrial Fibrillation on Eliquis C. Difficile Severe COPD Chronic tracheostomy Anxiety & depression Hepatitis  C Arthritis HTN Fibromyalgia Anemia CAD Thoracic AAA Significant Hospital Events: Including procedures, antibiotic start and stop dates in addition to other pertinent events   02/18/21: Admit to ICU with acute on chronic combined hypoxic/hypercapnic respiratory failure with chronic tracheostomy requiring mechanical ventilation 8/29 severe copd exacerbation 8/30 did NOT tolerate PS mode, plan for PS mode today 8/31 failed weaning trials, CT ABD  8/30 CT ABD/PELVIS Possible transition point in the central pelvis suggesting       partial or developing bowel obstruction over ileus. Distal small bowel and colon are relatively collapsed.     Interim History / Subjective:  S/p trach and VENT Severe resp failure Unable to wean from vent End stage COPD +COPD exacerbation +hypercapnia and +hypoxia +tracheal secretions mucopurulent   Objective   Blood pressure 115/71, pulse 77, temperature 99.2 F (37.3 C), temperature source Oral, resp. rate 20, height 5' (1.524 m), weight 53.9 kg, SpO2 97 %.    Vent Mode: PRVC FiO2 (%):  [35 %-40 %] 35 % Set Rate:  [16 bmp] 16 bmp Vt Set:  [400 mL] 400 mL PEEP:  [5 cmH20] 5 cmH20   Intake/Output Summary (Last 24 hours) at 02/21/2021 0721 Last data filed at 02/21/2021 0600 Gross per 24 hour  Intake 611.67 ml  Output 1900 ml  Net -1288.33 ml    Filed Weights   02/19/21 0426 02/20/21 0436 02/21/21 0500  Weight: 55.6 kg 55.7 kg 53.9 kg     REVIEW OF SYSTEMS  PATIENT IS UNABLE TO PROVIDE COMPLETE REVIEW OF  SYSTEMS DUE TO SEVERE CRITICAL ILLNESS AND TOXIC METABOLIC ENCEPHALOPATHY  PHYSICAL EXAMINATION:  GENERAL:critically ill appearing, +resp distress EYES: Pupils equal, round, reactive to light.  No scleral icterus.  MOUTH: Moist mucosal membrane. S/p TRACH NECK: Supple.  PULMONARY: +rhonchi, +wheezing CARDIOVASCULAR: S1 and S2.  No murmurs  GASTROINTESTINAL: Soft, nontender, -distended. Positive bowel sounds.  MUSCULOSKELETAL: No  swelling, clubbing, or edema.  NEUROLOGIC: lethargic SKIN:intact,warm,dry        Assessment & Plan:   70 yo chronic vent dependant hypoxic and hypercapnic respiratorory failure with severe acute hypoxia and acute hypercapnic resp failure due to severe end stage COPD exacerbation due to severe pneumonia with probable ileus and obstruction  Severe ACUTE Hypoxic and Hypercapnic Respiratory Failure -continue Mechanical Ventilator support -continue Bronchodilator Therapy -Wean Fio2 and PEEP as tolerated -VAP/VENT bundle implementation -will NOT perform SAT/SBT when respiratory parameters are met  SEVERE COPD EXACERBATION Acute on Chronic combined Hypoxic/ Hypercapnic Respiratory Failure secondary to COPD exacerbation & possible mucus plug? -continue IV steroids as prescribed -continue NEB THERAPY as prescribed -morphine as needed -wean fio2 as needed and tolerated - Ensure adequate pulmonary hygiene   ACUTE SYSTOLIC CARDIAC FAILURE- EF -oxygen as needed -Lasix as tolerated -follow up cardiac enzymes as indicated -follow up cardiology recs Suspected SVT vs A-fib RVR s/p adenosine Consider underlying ischemic cardiomyopathy   NEUROLOGY ACUTE TOXIC METABOLIC ENCEPHALOPATHY Acute Encephalopathy suspected to be metabolic secondary to hypercapnia in the setting of COPD exacerbation -need for sedation  ELECTROLYTES -follow labs as needed -replace as needed -pharmacy consultation and following   GI GI PROPHYLAXIS as indicated NUTRITIONAL STATUS DIET-->NPO RECOMMEND GEN SURGERY CONSULTATION    Best Practice (right click and "Reselect all SmartList Selections" daily)  Diet/type: NPO DVT prophylaxis: LMWH GI prophylaxis: PPI Lines: N/A Foley:  N/A Code Status:  full code   Labs   CBC: Recent Labs  Lab 02/18/21 1905 02/19/21 0600 02/20/21 0355 02/21/21 0606  WBC 12.8* 8.4 9.9 8.1  NEUTROABS  --   --  9.3*  --   HGB 8.4* 8.1* 7.6* 7.5*  HCT 30.0* 27.6* 24.3*  23.7*  MCV 108.3* 106.2* 100.8* 100.0  PLT 195 189 155 157     Basic Metabolic Panel: Recent Labs  Lab 02/18/21 1905 02/19/21 0600 02/19/21 1950 02/20/21 0355 02/21/21 0606  NA 143 142  --  145  --   K 5.1 4.2  --  3.2*  --   CL 90* 92*  --  94*  --   CO2 43* 38*  --  40*  --   GLUCOSE 126* 129*  --  170*  --   BUN 15 17  --  20  --   CREATININE 0.96 0.94  --  0.85  --   CALCIUM 9.0 9.0  --  8.4*  --   MG  --  2.0  --  2.0 2.1  PHOS  --  1.1* 3.4 3.8  --     GFR: Estimated Creatinine Clearance: 44.2 mL/min (by C-G formula based on SCr of 0.85 mg/dL). Recent Labs  Lab 02/18/21 1905 02/19/21 0600 02/20/21 0355 02/21/21 0606  PROCALCITON 0.98 1.16 0.66  --   WBC 12.8* 8.4 9.9 8.1     Liver Function Tests: Recent Labs  Lab 02/18/21 1905  AST 47*  ALT 26  ALKPHOS 22*  BILITOT 1.4*  PROT 6.7  ALBUMIN 3.9    No results for input(s): LIPASE, AMYLASE in the last 168 hours. No results for input(s): AMMONIA in the  last 168 hours.  ABG    Component Value Date/Time   PHART 7.56 (H) 02/19/2021 0500   PCO2ART 46 02/19/2021 0500   PO2ART 88 02/19/2021 0500   HCO3 41.2 (H) 02/19/2021 0500   O2SAT 97.9 02/19/2021 0500      Coagulation Profile: Recent Labs  Lab 02/19/21 0600  INR 1.2     Cardiac Enzymes: Recent Labs  Lab 02/18/21 2254  CKTOTAL 127      DVT/GI PRX  assessed I Assessed the need for Labs I Assessed the need for Foley I Assessed the need for Central Venous Line Family Discussion when available I Assessed the need for Mobilization I made an Assessment of medications to be adjusted accordingly Safety Risk assessment completed  CASE DISCUSSED IN MULTIDISCIPLINARY ROUNDS WITH ICU TEAM    Critical Care Time devoted to patient care services described in this note is 50 minutes.  Critical care was necessary to treat /prevent imminent and life-threatening deterioration. Overall, patient is critically ill, prognosis is guarded.      Corrin Parker, M.D.  [facility]  Medical Director [place] Director [facility] Cardio-Pulmonary Department

## 2021-02-21 NOTE — Progress Notes (Signed)
SLP Cancellation Note  Patient Details Name: Virginia [name] MRN: [id number] DOB: [birth year]   Cancelled treatment:       Reason Eval/Treat Not Completed: Patient not medically ready;Medical issues which prohibited therapy (chart reviewed). Pt has just moved to TC wean -- will await tolerance for 4 hours today b/f attempting PMV placement, then BSE. Pt given Communication Board at this time. Gave oral swabs(LG) for oral stimulation.  Per pt, she denied any difficulty swallowing at home w/ trach/PMV baseline.       Jerilynn Som, MS, CCC-SLP Speech Language Pathologist Rehab Services [phone] [facility] 02/21/2021, 11:17 AM

## 2021-02-21 NOTE — Progress Notes (Signed)
Daily Progress Note   Patient Name: Virginia [name]       Date: 02/21/2021 DOB: 09-Apr-1951  Age: 70 y.o. MRN#: [id number] Attending Physician: Erin Fulling, MD Primary Care Physician: Marguarite Arbour, MD Admit Date: 02/18/2021  Reason for Consultation/Follow-up: Establishing goals of care  Subjective: Patient is resting in bed on ventilator. She is able to speak moving lips. She states she has felt SOB, and was just suctioned, and that has helped her SOB. She states she takes Valium at home 3 times per day for anxiety. She states she has already been advised the trach and trilogy would likely be present indefinitely. She tells me about her home trilogy and O2 and how she is able to be mobile and independent in her home. She states it is not the best QOL, but is better than the alternative of death. She is grateful for her care and would like any care indicated at this time including CPR.   Length of Stay: 3  Current Medications: Scheduled Meds:  . Chlorhexidine Gluconate Cloth  6 each Topical QHS  . enoxaparin (LOVENOX) injection  1 mg/kg Subcutaneous Q12H  . insulin aspart  0-9 Units Subcutaneous Q4H  . ipratropium-albuterol  3 mL Nebulization Q6H  . methylPREDNISolone (SOLU-MEDROL) injection  20 mg Intravenous Q12H  . metoprolol tartrate  5 mg Intravenous Q6H  . pantoprazole (PROTONIX) IV  40 mg Intravenous QHS    Continuous Infusions: . ceFEPime (MAXIPIME) IV 2 g (02/21/21 1024)    PRN Meds: diazepam, docusate sodium, ipratropium-albuterol, morphine injection, polyethylene glycol  Physical Exam Pulmonary:     Comments: On ventilator.  Neurological:     Mental Status: She is alert.            Vital Signs: BP (!) 144/87   Pulse 91   Temp 99 F (37.2 C) (Oral)   Resp 19    Ht 5' (1.524 m)   Wt 53.9 kg   SpO2 98%   BMI 23.21 kg/m  SpO2: SpO2: 98 % O2 Device: O2 Device: Tracheostomy Collar O2 Flow Rate: O2 Flow Rate (L/min): 10 L/min  Intake/output summary:  Intake/Output Summary (Last 24 hours) at 02/21/2021 1106 Last data filed at 02/21/2021 0600 Gross per 24 hour  Intake 611.67 ml  Output 1400 ml  Net -788.33 ml  LBM: Last BM Date: 02/20/21 Baseline Weight: Weight: 54.3 kg Most recent weight: Weight: 53.9 kg         Patient Active Problem List   Diagnosis Date Noted  . Acute respiratory failure (HCC) 02/18/2021  . SVT (supraventricular tachycardia) (HCC) 02/18/2021  . Malnutrition of moderate degree 01/08/2021  . On mechanically assisted ventilation (HCC) 01/07/2021  . Hyperlipidemia 01/07/2021  . Anxiety 01/07/2021  . Acute on chronic respiratory failure (HCC) 01/06/2021  . Anorexia 08/04/2020  . Severe protein-calorie malnutrition (HCC) 08/04/2020  . Pressure injury of skin 07/14/2020  . Diarrhea   . Reactive thrombocytosis 07/12/2020  . Aspiration pneumonia of both lower lobes due to gastric secretions (HCC) 07/07/2020  . Acute urinary retention 07/06/2020  . Acute metabolic encephalopathy 07/06/2020  . Acute on chronic respiratory failure with hypoxia and hypercapnia (HCC) 07/06/2020  . COPD exacerbation (HCC)   . Respiratory failure (HCC) 06/06/2020  . Acute respiratory failure with hypoxia (HCC) 06/02/2020  . COPD with acute exacerbation (HCC) 06/02/2020  . Shortness of breath 06/02/2020  . GERD (gastroesophageal reflux disease) 06/02/2020  . Iron deficiency anemia 01/22/2020  . Anemia of chronic disease 01/17/2019  . Coronary artery disease involving native coronary artery of native heart 04/30/2018  . Palpitations 04/30/2018  . Chronic heartburn 04/12/2018  . Dysphagia 04/12/2018  . Thoracic aortic aneurysm without rupture (HCC) 08/27/2017  . 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 SOBOE  (shortness of breath on exertion) 01/24/2017  . AF (paroxysmal atrial fibrillation) (HCC) 11/09/2016  . Chronic hypoxemic respiratory failure (HCC) 12/06/2015  . Depression 12/06/2015  . Fibromyalgia 12/06/2015  . Hep C w/o coma, chronic (HCC) 12/06/2015  . History of tracheostomy 12/06/2015  . Hypertension 12/06/2015  . Localized osteoporosis without current pathological fracture 12/06/2015  . PEG (percutaneous endoscopic gastrostomy) status (HCC) 12/06/2015  . Peripheral neuropathy, idiopathic 12/06/2015  . RLS (restless legs syndrome) 12/06/2015  . 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  . COPD with acute lower respiratory infection (HCC) 11/29/2015    Palliative Care Assessment & Plan     Recommendations/Plan: Full code/full scope.     Code Status:    Code Status Orders  (From admission, onward)           Start     Ordered   02/18/21 2052  Full code  Continuous        02/18/21 2054           Code Status History     Date Active Date Inactive Code Status Order ID Comments User Context   01/06/2021 2058 01/12/2021 0049 Full Code [id number]  Rust-Chester, Cecelia Byars, NP ED   06/06/2020 1434 08/12/2020 0046 Full Code [id number]  Erin Fulling, MD ED   06/02/2020 2233 06/04/2020 2138 Full Code [id number]  Angie Fava, DO ED   03/06/2017 1238 03/07/2017 2049 Full Code [id number]  Poggi, Excell Seltzer, MD Inpatient   11/09/2016 2235 11/10/2016 1625 Full Code [id number]  Auburn Bilberry, MD Inpatient        Thank you for allowing the Palliative Medicine Team to assist in the care of this patient.       Total Time 25 min Prolonged Time Billed  no       Greater than 50%  of this time was spent counseling and coordinating care related to the above assessment and plan.  Morton Stall, NP  Please contact Palliative Medicine Team phone at [phone] for questions and concerns.

## 2021-02-22 DIAGNOSIS — J9622 Acute and chronic respiratory failure with hypercapnia: Secondary | ICD-10-CM | POA: Diagnosis not present

## 2021-02-22 DIAGNOSIS — J9621 Acute and chronic respiratory failure with hypoxia: Secondary | ICD-10-CM | POA: Diagnosis not present

## 2021-02-22 DIAGNOSIS — Z7189 Other specified counseling: Secondary | ICD-10-CM | POA: Diagnosis not present

## 2021-02-22 LAB — CBC
HCT: 25.9 % — ABNORMAL LOW (ref 36.0–46.0)
Hemoglobin: 7.9 g/dL — ABNORMAL LOW (ref 12.0–15.0)
MCH: 30.7 pg (ref 26.0–34.0)
MCHC: 30.5 g/dL (ref 30.0–36.0)
MCV: 100.8 fL — ABNORMAL HIGH (ref 80.0–100.0)
Platelets: 167 10*3/uL (ref 150–400)
RBC: 2.57 MIL/uL — ABNORMAL LOW (ref 3.87–5.11)
RDW: 15.4 % (ref 11.5–15.5)
WBC: 9.5 10*3/uL (ref 4.0–10.5)
nRBC: 0 % (ref 0.0–0.2)

## 2021-02-22 LAB — BASIC METABOLIC PANEL
Anion gap: 9 (ref 5–15)
BUN: 23 mg/dL (ref 8–23)
CO2: 36 mmol/L — ABNORMAL HIGH (ref 22–32)
Calcium: 8.8 mg/dL — ABNORMAL LOW (ref 8.9–10.3)
Chloride: 97 mmol/L — ABNORMAL LOW (ref 98–111)
Creatinine, Ser: 0.8 mg/dL (ref 0.44–1.00)
GFR, Estimated: >60 mL/min (ref >60)
Glucose, Bld: 109 mg/dL — ABNORMAL HIGH (ref 70–99)
Potassium: 3.9 mmol/L (ref 3.5–5.1)
Sodium: 142 mmol/L (ref 135–145)

## 2021-02-22 LAB — GLUCOSE, CAPILLARY
Glucose-Capillary: 104 mg/dL — ABNORMAL HIGH (ref 70–99)
Glucose-Capillary: 99 mg/dL (ref 70–99)
Glucose-Capillary: 76 mg/dL (ref 70–99)
Glucose-Capillary: 127 mg/dL — ABNORMAL HIGH (ref 70–99)
Glucose-Capillary: 110 mg/dL — ABNORMAL HIGH (ref 70–99)

## 2021-02-22 LAB — PHOSPHORUS: Phosphorus: 4.3 mg/dL (ref 2.5–4.6)

## 2021-02-22 LAB — HEPARIN ANTI-XA: Heparin LMW: >2 IU/mL

## 2021-02-22 LAB — MAGNESIUM: Magnesium: 2.2 mg/dL (ref 1.7–2.4)

## 2021-02-22 NOTE — Progress Notes (Signed)
ANTICOAGULATION CONSULT NOTE   Pharmacy Consult for Lovenox Dosing Indication: atrial fibrillation  No Known Allergies  Patient Measurements: Height: 5' (152.4 cm) Weight: 52.3 kg (115 lb 4.8 oz) IBW/kg (Calculated) : 45.5   Vital Signs: Temp: 99 F (37.2 C) (09/01 0400) Temp Source: Oral (09/01 0400) BP: 136/77 (09/01 0800) Pulse Rate: 75 (09/01 0800)  Labs: Recent Labs    02/20/21 0355 02/21/21 0606 02/22/21 0555  HGB 7.6* 7.5* 7.9*  HCT 24.3* 23.7* 25.9*  PLT 155 157 167  CREATININE 0.85 0.73 0.80     Estimated Creatinine Clearance: 47 mL/min (by C-G formula based on SCr of 0.8 mg/dL).   Medical History: Past Medical History:  Diagnosis Date   Anemia    Anxiety    Arthritis    Atrial fibrillation (HCC)    C. difficile colitis 09/2015   COPD (chronic obstructive pulmonary disease) (HCC)    Depression    Dyspnea    Dysrhythmia    Fibromyalgia    H/O tracheostomy    Hep C w/ coma, chronic    Hypertension    MRSA pneumonia (HCC) 2017   On home oxygen therapy    3 L/M    Osteoporosis    Peripheral neuropathy    RLS (restless legs syndrome)    S/P percutaneous endoscopic gastrostomy (PEG) tube placement (HCC) 09/2015   Ventilator associated pneumonia (HCC) 10/2015   [facility], [place]    Medications:  PTA Meds: Apixaban 5mg  BID  Assessment: Pt is 70 yo w/ history of A fib, presenting to ED with acute on chronic combined hypoxic and hypercapnic respiratory failure requiring Mechanical ventilation. Imaging c/f partial SBO < Ileus on 8/30, but no surgical intervention planned currently, pt remains NPO, serial KUBs, limit opiates.   Labs: INR - 1.2 @BL  (PTA eliquis) Hgb - 8.4>8.1>7.6>7.5>7.9 (stabilized/low) Plts - 195>189>155>157>167 (LLN/stable)  Goal of Therapy:  Anti-Xa level 0.6-1 units/ml 4hrs after LMWH dose given Monitor platelets by anticoagulation protocol: Yes   Plan:  Labs trend appropriately.  Continue Lovenox 1 mg/kg  q12h. Will check CBC and SCr at least every 3 days Will check peak anti-Xa 4hrs after steady-state dose, ISO low body weight and continued NPO for GI w/u to assess dosing. Level at 1800 9/01  , RPH,BCCP 02/22/2021 8:44 AM

## 2021-02-22 NOTE — Progress Notes (Signed)
PHARMACY CONSULT NOTE  Pharmacy Consult for Electrolyte Monitoring and Replacement   Recent Labs: Potassium (mmol/L)  Date Value  02/22/2021 3.9   Magnesium (mg/dL)  Date Value  21/22/4825 2.1   Calcium (mg/dL)  Date Value  00/37/0488 8.8 (L)   Albumin (g/dL)  Date Value  89/16/9450 2.7 (L)   Phosphorus (mg/dL)  Date Value  38/88/2800 2.9   Sodium (mmol/L)  Date Value  02/22/2021 142     Assessment: 70 y.o. female with a past medical history of COPD on 3 L nasal cannula, anxiety, fibromyalgia, hypertension, atrial fibrillation, AAA presents to the emergency department for shortness of breath. Pharmacy is asked to follow and replace electrolytes while in the CCU.  Goal of Therapy:  Potassium 4.0 - 5.1 mmol/L Magnesium 2.0 - 2.4 mg/dL All Other Electrolytes WNL  Plan:  K: 3.5>3.9  No further repletion today. Mg/Phos WNL; last 8/31. Re-check electrolytes in AM  Martyn Malay ,PharmD, [facility] Clinical Pharmacist 02/22/2021 8:14 AM

## 2021-02-22 NOTE — Progress Notes (Addendum)
Daily Progress Note   Patient Name: Virginia [name]       Date: 02/22/2021 DOB: 1951/01/15  Age: 70 y.o. MRN#: [id number] Attending Physician: Erin Fulling, MD Primary Care Physician: Marguarite Arbour, MD Admit Date: 02/18/2021  Reason for Consultation/Follow-up: Establishing goals of care  Subjective: Patient has successfully weaned to tolerate her TC as per her baseline of TC during the day and trilogy at night. She is feeling better. Hopeful for continued improvement. Please call for needs.   Length of Stay: 4  Current Medications: Scheduled Meds:  . Chlorhexidine Gluconate Cloth  6 each Topical QHS  . enoxaparin (LOVENOX) injection  1 mg/kg Subcutaneous Q12H  . insulin aspart  0-9 Units Subcutaneous Q4H  . ipratropium-albuterol  3 mL Nebulization Q6H  . methylPREDNISolone (SOLU-MEDROL) injection  20 mg Intravenous Q12H  . metoprolol tartrate  5 mg Intravenous Q6H  . pantoprazole (PROTONIX) IV  40 mg Intravenous QHS    Continuous Infusions: . ceFEPime (MAXIPIME) IV 2 g (02/22/21 0943)    PRN Meds: diazepam, docusate sodium, ipratropium-albuterol, morphine injection, polyethylene glycol  Physical Exam Pulmonary:     Comments: TC Neurological:     Mental Status: She is alert.            Vital Signs: BP (!) 163/80   Pulse 71   Temp 98.2 F (36.8 C) (Oral)   Resp 17   Ht 5' (1.524 m)   Wt 52.3 kg   SpO2 97%   BMI 22.52 kg/m  SpO2: SpO2: 97 % O2 Device: O2 Device: Tracheostomy Collar O2 Flow Rate: O2 Flow Rate (L/min): 10 L/min  Intake/output summary:  Intake/Output Summary (Last 24 hours) at 02/22/2021 1622 Last data filed at 02/22/2021 0500 Gross per 24 hour  Intake 200 ml  Output 600 ml  Net -400 ml   LBM: Last BM Date: 02/21/21 Baseline Weight: Weight:  54.3 kg Most recent weight: Weight: 52.3 kg          Patient Active Problem List   Diagnosis Date Noted  . Acute respiratory failure (HCC) 02/18/2021  . SVT (supraventricular tachycardia) (HCC) 02/18/2021  . Malnutrition of moderate degree 01/08/2021  . On mechanically assisted ventilation (HCC) 01/07/2021  . Hyperlipidemia 01/07/2021  . Anxiety 01/07/2021  . Acute on chronic respiratory failure (HCC) 01/06/2021  . Anorexia  08/04/2020  . Severe protein-calorie malnutrition (HCC) 08/04/2020  . Pressure injury of skin 07/14/2020  . Diarrhea   . Reactive thrombocytosis 07/12/2020  . Aspiration pneumonia of both lower lobes due to gastric secretions (HCC) 07/07/2020  . Acute urinary retention 07/06/2020  . Acute metabolic encephalopathy 07/06/2020  . Acute on chronic respiratory failure with hypoxia and hypercapnia (HCC) 07/06/2020  . COPD exacerbation (HCC)   . Respiratory failure (HCC) 06/06/2020  . Acute respiratory failure with hypoxia (HCC) 06/02/2020  . COPD with acute exacerbation (HCC) 06/02/2020  . Shortness of breath 06/02/2020  . GERD (gastroesophageal reflux disease) 06/02/2020  . Iron deficiency anemia 01/22/2020  . Anemia of chronic disease 01/17/2019  . Coronary artery disease involving native coronary artery of native heart 04/30/2018  . Palpitations 04/30/2018  . Chronic heartburn 04/12/2018  . Dysphagia 04/12/2018  . Thoracic aortic aneurysm without rupture (HCC) 08/27/2017  . 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 SOBOE (shortness of breath on exertion) 01/24/2017  . AF (paroxysmal atrial fibrillation) (HCC) 11/09/2016  . Chronic hypoxemic respiratory failure (HCC) 12/06/2015  . Depression 12/06/2015  . Fibromyalgia 12/06/2015  . Hep C w/o coma, chronic (HCC) 12/06/2015  . History of tracheostomy 12/06/2015  . Hypertension 12/06/2015  . Localized osteoporosis without current pathological fracture 12/06/2015  . PEG  (percutaneous endoscopic gastrostomy) status (HCC) 12/06/2015  . Peripheral neuropathy, idiopathic 12/06/2015  . RLS (restless legs syndrome) 12/06/2015  . 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  . COPD with acute lower respiratory infection (HCC) 11/29/2015    Palliative Care Assessment & Plan    Recommendations/Plan: Full code/full scope.     Code Status:    Code Status Orders  (From admission, onward)           Start     Ordered   02/18/21 2052  Full code  Continuous        02/18/21 2054           Code Status History     Date Active Date Inactive Code Status Order ID Comments User Context   01/06/2021 2058 01/12/2021 0049 Full Code [id number]  Rust-Chester, Cecelia Byars, NP ED   06/06/2020 1434 08/12/2020 0046 Full Code [id number]  Erin Fulling, MD ED   06/02/2020 2233 06/04/2020 2138 Full Code [id number]  Angie Fava, DO ED   03/06/2017 1238 03/07/2017 2049 Full Code [id number]  Poggi, Excell Seltzer, MD Inpatient   11/09/2016 2235 11/10/2016 1625 Full Code [id number]  Auburn Bilberry, MD Inpatient      Thank you for allowing the Palliative Medicine Team to assist in the care of this patient.       Total Time 15 min Prolonged Time Billed  no       Greater than 50%  of this time was spent counseling and coordinating care related to the above assessment and plan.  Morton Stall, NP  Please contact Palliative Medicine Team phone at [phone] for questions and concerns.

## 2021-02-22 NOTE — Progress Notes (Signed)
  Ordering food, tolerating liquids. Abdomen soft and nontender.  No clinical evidence of bowel obstruction. General surgery to sign off.

## 2021-02-22 NOTE — TOC Progression Note (Signed)
Transition of Care [facility]) - Progression Note    Patient Details  Name: Virginia [name] MRN: [id number] Date of Birth: [birth year]  Transition of Care [facility]) CM/SW Contact  Marina Goodell Phone Number: [phone] 02/22/2021, 7:20 AM  Clinical Narrative:     Patient remains on vent. Patient has mentioned abdominal pain in the last 24-48 hours.  Surgery has been consulted.  Trach collar trial planned today 02/21/2021. Main contact Sheyann, [place] (Son) [phone] (Home Phone)  Expected Discharge Plan: Home w Home Health Services Barriers to Discharge: Continued Medical Work up  Expected Discharge Plan and Services Expected Discharge Plan: Home w Home Health Services In-house Referral: Clinical Social Work   Post Acute Care Choice: Durable Medical Equipment Living arrangements for the past 2 months: Apartment                                       Social Determinants of Health (SDOH) Interventions    Readmission Risk Interventions No flowsheet data found.

## 2021-02-22 NOTE — Evaluation (Signed)
Passy-Muir Speaking Valve - Evaluation Patient Details  Name: Virginia [name] MRN: [id number] Date of Birth: 02-26-1951  Today's Date: 02/22/2021 Time: 8938-1017 SLP Time Calculation (min) (ACUTE ONLY): 75 min  Past Medical History:  Past Medical History:  Diagnosis Date   Anemia    Anxiety    Arthritis    Atrial fibrillation (HCC)    C. difficile colitis 09/2015   COPD (chronic obstructive pulmonary disease) (HCC)    Depression    Dyspnea    Dysrhythmia    Fibromyalgia    H/O tracheostomy    Hep C w/ coma, chronic    Hypertension    MRSA pneumonia (HCC) 2017   On home oxygen therapy    3 L/M    Osteoporosis    Peripheral neuropathy    RLS (restless legs syndrome)    S/P percutaneous endoscopic gastrostomy (PEG) tube placement (HCC) 09/2015   Ventilator associated pneumonia (HCC) 10/2015   [facility], [place]   Past Surgical History:  Past Surgical History:  Procedure Laterality Date   ABDOMINAL HYSTERECTOMY     BREAST SURGERY Bilateral    Breast Implants   DILATION AND CURETTAGE OF UTERUS     PEG PLACEMENT N/A 06/21/2020   Procedure: PERCUTANEOUS ENDOSCOPIC GASTROSTOMY (PEG) PLACEMENT;  Surgeon: Regis Bill, MD;  Location: [facility] ENDOSCOPY;  Service: Endoscopy;  Laterality: N/A;   REVERSE SHOULDER ARTHROPLASTY Right 03/06/2017   Procedure: REVERSE SHOULDER ARTHROPLASTY;  Surgeon: Christena Flake, MD;  Location: [facility] ORS;  Service: Orthopedics;  Laterality: Right;   ROTATOR CUFF REPAIR Bilateral    TRACHEOSTOMY TUBE PLACEMENT N/A 06/15/2020   Procedure: TRACHEOSTOMY;  Surgeon: Geanie Logan, MD;  Location: [facility] ORS;  Service: ENT;  Laterality: N/A;   HPI:  Pt is a 70 year old female presented to [facility] ED via EMS on 02/18/2021 with acute on chronic combined hypoxic and hypercapnic respiratory failure. Per son, Virginia [name], patient lives at home alone and is assisted daily by himself and his wife as well as a home health nurse who comes out 3 times a week.  [name] reports it is he and his wife who help put her on her trilogy machine nightly. The patient normally uses the trilogy as a CPAP mask with the tracheostomy capped overnight and is on 4.5 to 5 L nasal cannula during the day.  The patient's son reported that he and wife came down with COVID-19 last Wednesday, and have not been available to help the patient use this machine at night since 02/13/21, and so she has gone without it. Son and her home health nurse reported that pt has had multiple falls. He stated that yesterday, on 02/17/21, she fell in the bathroom and was stranded on the floor from 3 am until the home helper arrived hours later. The son, daughter-in-law and home health were concerned about the patient's respiratory status, specifically possible CO2 retention, and EMS was called. The first 2 times the patient refused to go with EMS. The third time however the patient's SpO2 was reading in the 50's with a good waveform, she was placed on CPAP and brought to the ED for evaluation.     ED course: Upon arrival in the ED patient was transitioned from CPAP to mechanical ventilation via chronic tracheostomy. Shortly after arrival patient became extremely tachycardic with heart rates in the 200s and what appeared to be SVT.  RT reported that the trach in place was fairly clogged with mucus and was changed out - cuffed tracheostomy.  Assessment / Plan / Recommendation Clinical Impression  Pt seen for clinical assessment of Passy-Muir Valve use/toleration for verbal communication, then BSE to establish least restrictive po diet. BSE and PMV evals completed. Pt seen for both PMV and BSE today per MD order. Pt has a Chronic Tracheostomy and has used the PMV at home previously per chart notes. Pt has tolerated TC wean today per RT. Pt was alert, communicative during session; suspect mild Cognitive impairment, easily distracted and required verbal cues for follow through w/ tasks.    Explained the use and wear  of the PMV to pt; trach and stoma area inspected; Shiley Cuffed trach #6. Pt's respiratory effort appeared min increased w/ any exertion including Talking and Moving to sit upright in bed. RR: 18-25, O2 sats 96-99%, HR 77-83. FiO2 35% on 10L per chart. Cuff deflated at baseline. Pt's PMV placed w/out difficulty or distress, and pt was already verbalizing w/ others. Pt verbalized indicating her requests for some "coffee". Verbalizations and conversation were c/b grossly adequate volume and intelligibility w/ fair breath support/effort, though gravely, min strained vocal quality present. This is Baseline for pt. Encouraged pt to focus on slowing down during conversation and using her breath support to support her speech/volume. PMV placement was tolerated during sessions w/out noted O2 desaturation, or significant change in RR/HR from his baseline. No overt discomfort noted in her respiratory effort -- pt stated it felt "fine" to wear/talk w/. No increased effort noted in respirations during the expiratory phase; no overt use of accessary muscles or distress was noted in his breathing pattern. PMV was allowed to remain on as pt consumed po's.   Pt appears to adequately tolerate PMV placement w/out overt, gross respiratory discomfort or distress; ANS remained adequately stable at her Baseline during wear/use. Suspect Shiley trach size, #6, is beneficial for comfortable PMV wear/use.  Much education was given on PMV use/wear, MUST have Cuff deflation for PMV wear, checking and removing the air from the balloon b/f placing, placing/removing the PMV, and care of the PMV. Discussed that is MUST be worn for all eating/drinking; and can be work w/ Therapies(OT, PT). Must NOT be worn when sleeping. Encouraged Rest Breaks at times during the day. Precautions posted at bedside and in chart.  Pt remains w/ a Cuffed trach during her wean at this time -  unsure if pt will transition back to a CUFFLESS trach for D/C. Sticker  placed on Cuff line, and in room. NSG made aware. MD updated. ST services will continue to monitor for any further needs while admitted.     BSE:  Pt seen for assessment of swallowing. She is able to wear the PMV comfortably for verbal communication and now for po intake. See session noted above.  Pt explained general aspiration precautions and agreed verbally to the need for following them especially sitting upright for all oral intake, No Talking w/ food and drink in mouth, and wearing PMV for ALL oral intake. Pt assisted w/ sitting up and using multiple pillows behind her for a forward position. Noted min increased RR rate and effort d/t the moving in bed. After Rest Break, she was then given trials of thin liquids, ice chips, and purees. NO overt clinical s/s of aspiration were noted w/ any consistency; No coughing, respiratory status remained relatively calm and overtly unlabored w/ Rest Breaks(as needed), vocal quality clear b/t trials. O2 sats 98%; HR 82; RR low 20s. Pt held Cup when drinking and fed self following instructions  for single, small sips slowly given intermittent verbal cues. Straws were NOT utilized for better oral and Pulmonary control. Oral phase appeared grossly [facility] for bolus management and timely A-P transfer for swallowing. Pt stated she did not have her upper Denture - warned against the tougher food consistencies as they may be too effortful for mashing/gumming thus increase WOB.  Oral clearing achieved w/ all consistencies given Time. Pt is Missing some Bottom Dentition for full, effective mastication.     Recommended a Dysphagia level 3 diet (mech soft w/ MINCED meats) w/ gravies added to moisten foods; Thin liquids via CUP only. Recommend aspiration precautions; Pills WHOLE in Puree d/t Pulmonary status and for ease of swallowing; tray setup and positioning assistance Upright for meals. PMV MUST BE PLACED FOR ALL ORAL INTAKE. Monitoring w/ all oral intake. ST services will  continue to f/u w/ pt for toleration of diet and education on precautions while admitted. NSG, NP, and MD updated on above. Precautions posted at bedside for PMV wear/use and aspiration precautions. 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)    SLP Assessment  Patient needs continued Speech Lanaguage Pathology Services    Follow Up Recommendations  None    Frequency and Duration min 1 x/week  1 week    PMSV Trial PMSV was placed for: ~35 mins Able to redirect subglottic air through upper airway: Yes Able to Attain Phonation: Yes Voice Quality: Normal (min gravely - baseline) Able to Expectorate Secretions: No attempts Breath Support for Phonation: Adequate Intelligibility: Intelligible Respirations During Trial: 19 SpO2 During Trial: 98 % Pulse During Trial: 81 Behavior: Alert;Cooperative;Expresses self well;Good eye contact;Responsive to questions   Tracheostomy Tube  Additional Tracheostomy Tube Assessment Trach Collar Period: ~6 hours today so far Secretion Description: min Frequency of Tracheal Suctioning: prn Level of Secretion Expectoration: Tracheal    Vent Dependency  Vent Dependent: No (nocturnal support)    Cuff Deflation Trial  GO Tolerated Cuff Deflation: Yes Length of Time for Cuff Deflation Trial: baseline when off vent Behavior: Alert;Cooperative;Expresses self well;Good eye contact;Smiling;Responsive to questions Cuff Deflation Trial - Comments: n/a        Dianely Krehbiel 02/22/2021, 4:57 PM Jerilynn Som, MS, CCC-SLP Speech Language Pathologist Rehab Services [phone]

## 2021-02-22 NOTE — Progress Notes (Signed)
GOALS OF CARE DISCUSSION  The Clinical status was relayed to family in detail. Son updated ( [name]) Updated and notified of patients medical condition.  Explained to family course of therapy and the modalities  Plan to to wean off vent Plan to advance food as tolerated Assess ABD symptoms Assess resp status  PATIENT REMAINS FULL CODE  Family understands the situation.   Family are satisfied with Plan of action and management. All questions answered  Additional CC time 20 mins   Colbi Schiltz Santiago Glad, M.D.  [facility]  Medical Director [facility] Medical Director [facility] Cardio-Pulmonary Department

## 2021-02-22 NOTE — Progress Notes (Signed)
NAME:  Virginia [name], MRN:  [id number], DOB:  [birth year], LOS: 4 ADMISSION DATE:  02/18/2021, CONSULTATION DATE:  02/18/21 REFERRING MD:  Dr. Lenard Lance, CHIEF COMPLAINT:  Shortness of breath   History of Present Illness:  70 year old female presented to [facility] ED via EMS on 02/18/2021 with acute on chronic combined hypoxic and hypercapnic respiratory failure.   Per son, Virginia [name], patient lives at home alone and is assisted daily by himself and his wife as well as a home health nurse who comes out 3 times a week.  [name] reports it is he and his wife who help put her on her trilogy machine nightly.  The patient normally uses the trilogy as a CPAP mask with the tracheostomy capped overnight and is on 4.5 to 5 L nasal cannula during the day.   The patient's son reported that he and wife came down with COVID-19 last Wednesday, 02/14/2021, and have not been available to help the patient use this machine at night since 02/13/21, and so she has gone without it. Virginia [name] stated that his mother and her home health nurse reported to him that she has had multiple falls. He stated that yesterday, on 02/17/21, she fell in the bathroom and was stranded on the floor from 3 am until the home helper arrived hours later. The son, daughter-in-law and home health were concerned about the patient's respiratory status, specifically possible CO2 retention, and EMS was called. The first 2 times the patient refused to go with EMS. The third time however the patient's SpO2 was reading in the 50's with a good waveform, she was placed on CPAP and brought to the ED for evaluation.   PCCM consulted to admit to ICU as patient is requiring mechanical ventilation support.  The patient's mentation has improved but PCO2 after 1 hour is 95.  Pertinent  Medical History  Atrial Fibrillation on Eliquis C. Difficile Severe COPD Chronic tracheostomy Anxiety & depression Hepatitis  C Arthritis HTN Fibromyalgia Anemia CAD Thoracic AAA Significant Hospital Events: Including procedures, antibiotic start and stop dates in addition to other pertinent events   02/18/21: Admit to ICU with acute on chronic combined hypoxic/hypercapnic respiratory failure with chronic tracheostomy requiring mechanical ventilation 8/29 severe copd exacerbation 8/30 did NOT tolerate PS mode, plan for PS mode today 8/31 failed weaning trials, CT ABD  8/30 CT ABD/PELVIS Possible transition point in the central pelvis suggesting       partial or developing bowel obstruction over ileus. Distal small bowel and colon are relatively collapsed-casuing failure to wean from vent 9/1 less distended on vent, alert and awake     Interim History / Subjective:   More alert more awake s tolerated pressure support trial yesterday General surgery following no indication for NG tube placement Follow serial examinations and KUBs End-stage COPD vent dependent Positive COPD exacerbation and hypoxia Objective   Blood pressure 136/77, pulse 75, temperature 99 F (37.2 C), temperature source Oral, resp. rate 15, height 5' (1.524 m), weight 52.3 kg, SpO2 97 %.    Vent Mode: Spontaneous FiO2 (%):  [35 %-40 %] 35 % PEEP:  [5 cmH20] 5 cmH20 Pressure Support:  [5 cmH20] 5 cmH20   Intake/Output Summary (Last 24 hours) at 02/22/2021 0830 Last data filed at 02/22/2021 0500 Gross per 24 hour  Intake 200 ml  Output 1200 ml  Net -1000 ml    Filed Weights   02/20/21 0436 02/21/21 0500 02/22/21 0500  Weight: 55.7 kg 53.9 kg 52.3 kg  Limited ROS due to increased work of breathing shortness of breath   PHYSICAL EXAMINATION:  GENERAL:critically ill appearing,  EYES: Pupils equal, round, reactive to light.  No scleral icterus.  MOUTH: Moist mucosal membrane.  Status post trach NECK: Supple.  PULMONARY: +rhonchi, +wheezing CARDIOVASCULAR: S1 and S2.  No murmurs  GASTROINTESTINAL: Soft, nontender, -distended.  Positive bowel sounds.  MUSCULOSKELETAL: No swelling, clubbing, or edema.  NEUROLOGIC: Awake SKIN:intact,warm,dry         Assessment & Plan:   70 yo chronic vent dependant hypoxic and hypercapnic respiratorory failure with severe acute hypoxia and acute hypercapnic resp failure due to severe end stage COPD exacerbation due to severe pneumonia with probable ileus and obstruction  Acute on chronic respiratory failure from COPD exacerbation with acute hypoxic and hypercapnic episode from COPD exacerbation Patient has been failing to wean from ventilator due to ileus along with COPD exacerbation  SEVERE COPD EXACERBATION -continue IV steroids as prescribed -continue NEB THERAPY as prescribed -morphine as needed -wean fio2 as needed and tolerated  Acute cardiac failure Suspected SVT vs A-fib RVR s/p adenosine Consider underlying ischemic cardiomyopathy   NEUROLOGY ACUTE TOXIC METABOLIC ENCEPHALOPATHY Improving with ventilatory support Avoid sedatives if possible Patient on anxiolytics  ELECTROLYTES -follow labs as needed -replace as needed -pharmacy consultation and following   GI Probable ileus small bowel obstruction GI PROPHYLAXIS as indicated NUTRITIONAL STATUS DIET-->NPO RECOMMEND GEN SURGERY CONSULTATION    Best Practice (right click and "Reselect all SmartList Selections" daily)  Diet/type: NPO DVT prophylaxis: LMWH GI prophylaxis: PPI Lines: N/A Foley:  N/A Code Status:  full code   Labs   CBC: Recent Labs  Lab 02/18/21 1905 02/19/21 0600 02/20/21 0355 02/21/21 0606  WBC 12.8* 8.4 9.9 8.1  NEUTROABS  --   --  9.3*  --   HGB 8.4* 8.1* 7.6* 7.5*  HCT 30.0* 27.6* 24.3* 23.7*  MCV 108.3* 106.2* 100.8* 100.0  PLT 195 189 155 157     Basic Metabolic Panel: Recent Labs  Lab 02/18/21 1905 02/19/21 0600 02/19/21 1950 02/20/21 0355 02/21/21 0606 02/22/21 0555  NA 143 142  --  145 143 142  K 5.1 4.2  --  3.2* 3.5 3.9  CL 90* 92*  --  94*  95* 97*  CO2 43* 38*  --  40* 37* 36*  GLUCOSE 126* 129*  --  170* 112* 109*  BUN 15 17  --  20 17 23   CREATININE 0.96 0.94  --  0.85 0.73 0.80  CALCIUM 9.0 9.0  --  8.4* 8.5* 8.8*  MG  --  2.0  --  2.0 2.1  --   PHOS  --  1.1* 3.4 3.8 2.9  --     GFR: Estimated Creatinine Clearance: 47 mL/min (by C-G formula based on SCr of 0.8 mg/dL). Recent Labs  Lab 02/18/21 1905 02/19/21 0600 02/20/21 0355 02/21/21 0606  PROCALCITON 0.98 1.16 0.66  --   WBC 12.8* 8.4 9.9 8.1     Liver Function Tests: Recent Labs  Lab 02/18/21 1905 02/21/21 0606  AST 47*  --   ALT 26  --   ALKPHOS 22*  --   BILITOT 1.4*  --   PROT 6.7  --   ALBUMIN 3.9 2.7*    No results for input(s): LIPASE, AMYLASE in the last 168 hours. No results for input(s): AMMONIA in the last 168 hours.  ABG    Component Value Date/Time   PHART 7.56 (H) 02/19/2021 0500   PCO2ART 46  02/19/2021 0500   PO2ART 88 02/19/2021 0500   HCO3 41.2 (H) 02/19/2021 0500   O2SAT 97.9 02/19/2021 0500      Coagulation Profile: Recent Labs  Lab 02/19/21 0600  INR 1.2     Cardiac Enzymes: Recent Labs  Lab 02/18/21 2254  CKTOTAL 127      DVT/GI PRX  assessed I Assessed the need for Labs I Assessed the need for Foley I Assessed the need for Central Venous Line Family Discussion when available I Assessed the need for Mobilization I made an Assessment of medications to be adjusted accordingly Safety Risk assessment completed  CASE DISCUSSED IN MULTIDISCIPLINARY ROUNDS WITH ICU TEAM     Critical Care Time devoted to patient care services described in this note is 45 minutes.  Critical care was necessary to treat /prevent imminent and life-threatening deterioration.  Lucie Leather, M.D.  [facility]  Medical Director [facility] Medical Director [facility] Cardio-Pulmonary Department

## 2021-02-22 NOTE — Progress Notes (Addendum)
ANTICOAGULATION CONSULT NOTE   Pharmacy Consult for Lovenox Dosing Indication: atrial fibrillation  No Known Allergies  Patient Measurements: Height: 5' (152.4 cm) Weight: 52.3 kg (115 lb 4.8 oz) IBW/kg (Calculated) : 45.5   Vital Signs: Temp: 98.3 F (36.8 C) (09/01 1600) Temp Source: Oral (09/01 1600) BP: 148/81 (09/01 1800) Pulse Rate: 60 (09/01 1800)  Labs: Recent Labs    02/20/21 0355 02/21/21 0606 02/22/21 0555 02/22/21 1805  HGB 7.6* 7.5* 7.9*  --   HCT 24.3* 23.7* 25.9*  --   PLT 155 157 167  --   HEPRLOWMOCWT  --   --   --  >2.00  CREATININE 0.85 0.73 0.80  --      Estimated Creatinine Clearance: 47 mL/min (by C-G formula based on SCr of 0.8 mg/dL).   Medical History: Past Medical History:  Diagnosis Date   Anemia    Anxiety    Arthritis    Atrial fibrillation (HCC)    C. difficile colitis 09/2015   COPD (chronic obstructive pulmonary disease) (HCC)    Depression    Dyspnea    Dysrhythmia    Fibromyalgia    H/O tracheostomy    Hep C w/ coma, chronic    Hypertension    MRSA pneumonia (HCC) 2017   On home oxygen therapy    3 L/M    Osteoporosis    Peripheral neuropathy    RLS (restless legs syndrome)    S/P percutaneous endoscopic gastrostomy (PEG) tube placement (HCC) 09/2015   Ventilator associated pneumonia (HCC) 10/2015   [facility], [place]    Medications:  PTA Meds: Apixaban 5mg  BID  Assessment: Pt is 70 yo w/ history of A fib, presenting to ED with acute on chronic combined hypoxic and hypercapnic respiratory failure requiring Mechanical ventilation. Imaging c/f partial SBO < Ileus on 8/30, but no surgical intervention planned currently, pt remains NPO, serial KUBs, limit opiates.   Labs: INR - 1.2 @BL  (PTA eliquis) Hgb - 8.4>8.1>7.6>7.5>7.9 (stabilized/low) Plts - 195>189>155>157>167 (LLN/stable)  Goal of Therapy:  Anti-Xa level 0.6-1 units/ml 4hrs after LMWH dose given Monitor platelets by anticoagulation protocol:  Yes   Plan:  Anti Xa level on 9/1 @1805  >2.0. Hold Lovenox 1 mg/kg q12h until anti-Xa <0.5, then decrease dose by 40%. Recheck anti-Xa level prior to next dose.  CBC per protocol   , PharmD 02/22/2021 7:39 PM

## 2021-02-23 DIAGNOSIS — J156 Pneumonia due to other aerobic Gram-negative bacteria: Secondary | ICD-10-CM

## 2021-02-23 DIAGNOSIS — J9622 Acute and chronic respiratory failure with hypercapnia: Secondary | ICD-10-CM | POA: Diagnosis not present

## 2021-02-23 DIAGNOSIS — J9621 Acute and chronic respiratory failure with hypoxia: Secondary | ICD-10-CM | POA: Diagnosis not present

## 2021-02-23 LAB — BASIC METABOLIC PANEL
Anion gap: 8 (ref 5–15)
BUN: 25 mg/dL — ABNORMAL HIGH (ref 8–23)
CO2: 34 mmol/L — ABNORMAL HIGH (ref 22–32)
Calcium: 8.7 mg/dL — ABNORMAL LOW (ref 8.9–10.3)
Chloride: 96 mmol/L — ABNORMAL LOW (ref 98–111)
Creatinine, Ser: 0.74 mg/dL (ref 0.44–1.00)
GFR, Estimated: >60 mL/min (ref >60)
Glucose, Bld: 118 mg/dL — ABNORMAL HIGH (ref 70–99)
Potassium: 3.8 mmol/L (ref 3.5–5.1)
Sodium: 138 mmol/L (ref 135–145)

## 2021-02-23 LAB — CBC
HCT: 26.4 % — ABNORMAL LOW (ref 36.0–46.0)
Hemoglobin: 8.3 g/dL — ABNORMAL LOW (ref 12.0–15.0)
MCH: 30.7 pg (ref 26.0–34.0)
MCHC: 31.4 g/dL (ref 30.0–36.0)
MCV: 97.8 fL (ref 80.0–100.0)
Platelets: 165 10*3/uL (ref 150–400)
RBC: 2.7 MIL/uL — ABNORMAL LOW (ref 3.87–5.11)
RDW: 15.2 % (ref 11.5–15.5)
WBC: 8.6 10*3/uL (ref 4.0–10.5)
nRBC: 0 % (ref 0.0–0.2)

## 2021-02-23 LAB — GLUCOSE, CAPILLARY
Glucose-Capillary: 105 mg/dL — ABNORMAL HIGH (ref 70–99)
Glucose-Capillary: 120 mg/dL — ABNORMAL HIGH (ref 70–99)
Glucose-Capillary: 124 mg/dL — ABNORMAL HIGH (ref 70–99)
Glucose-Capillary: 124 mg/dL — ABNORMAL HIGH (ref 70–99)
Glucose-Capillary: 126 mg/dL — ABNORMAL HIGH (ref 70–99)
Glucose-Capillary: 83 mg/dL (ref 70–99)
Glucose-Capillary: 98 mg/dL (ref 70–99)

## 2021-02-23 LAB — MAGNESIUM: Magnesium: 2.2 mg/dL (ref 1.7–2.4)

## 2021-02-23 LAB — HEPARIN ANTI-XA
Heparin LMW: 1.42 IU/mL
Heparin LMW: 0.57 IU/mL

## 2021-02-23 LAB — PHOSPHORUS: Phosphorus: 3.8 mg/dL (ref 2.5–4.6)

## 2021-02-23 MED ORDER — ENSURE ENLIVE PO LIQD
237.0000 mL | Freq: Two times a day (BID) | ORAL | Status: DC
  Administered 2021-02-24 – 2021-03-05 (×18): 237 mL via ORAL

## 2021-02-23 MED ORDER — IPRATROPIUM-ALBUTEROL 0.5-2.5 (3) MG/3ML IN SOLN: 3.0000 mL | Freq: Four times a day (QID) | RESPIRATORY_TRACT | 0 refills | Status: DC

## 2021-02-23 MED ORDER — HYDROCODONE-ACETAMINOPHEN 7.5-325 MG PO TABS
1.0000 | ORAL_TABLET | Freq: Four times a day (QID) | ORAL | Status: DC | PRN
  Administered 2021-02-23 – 2021-03-12 (×49): 1 via ORAL
  Filled 2021-02-23 (×51): qty 1

## 2021-02-23 MED ORDER — PREDNISONE 20 MG PO TABS
20.0000 mg | ORAL_TABLET | Freq: Every day | ORAL | Status: AC
  Administered 2021-02-24 – 2021-02-28 (×5): 20 mg via ORAL
  Filled 2021-02-23 (×5): qty 1

## 2021-02-23 MED ORDER — ADULT MULTIVITAMIN W/MINERALS CH
1.0000 | ORAL_TABLET | Freq: Every day | ORAL | Status: DC
  Administered 2021-02-24 – 2021-03-12 (×14): 1 via ORAL
  Filled 2021-02-23 (×15): qty 1

## 2021-02-23 MED ORDER — APIXABAN 5 MG PO TABS
5.0000 mg | ORAL_TABLET | Freq: Two times a day (BID) | ORAL | Status: DC
  Administered 2021-02-23 – 2021-03-12 (×34): 5 mg via ORAL
  Filled 2021-02-23 (×34): qty 1

## 2021-02-23 MED ORDER — PREDNISONE 20 MG PO TABS: 20.0000 mg | ORAL_TABLET | Freq: Every day | ORAL | 0 refills | Status: DC

## 2021-02-23 MED ORDER — ALBUTEROL SULFATE (2.5 MG/3ML) 0.083% IN NEBU
2.5000 mg | INHALATION_SOLUTION | RESPIRATORY_TRACT | Status: DC | PRN
  Administered 2021-02-26: 2.5 mg via RESPIRATORY_TRACT
  Filled 2021-02-23: qty 3

## 2021-02-23 MED ORDER — CEFDINIR 300 MG PO CAPS: 300.0000 mg | ORAL_CAPSULE | Freq: Two times a day (BID) | ORAL | 0 refills | Status: DC

## 2021-02-23 MED ORDER — CEFDINIR 300 MG PO CAPS
300.0000 mg | ORAL_CAPSULE | Freq: Two times a day (BID) | ORAL | Status: AC
  Administered 2021-02-23 – 2021-02-25 (×5): 300 mg via ORAL
  Filled 2021-02-23 (×5): qty 1

## 2021-02-23 NOTE — Progress Notes (Signed)
Nutrition Follow-up  DOCUMENTATION CODES:   Non-severe (moderate) malnutrition in context of acute illness/injury  INTERVENTION:   Ensure Enlive po BID, each supplement provides 350 kcal and 20 grams of protein  Magic cup TID with meals, each supplement provides 290 kcal and 9 grams of protein  MVI po daily   Pt at moderate refeed risk; recommend monitor potassium, magnesium and phosphorus labs daily until stable  NUTRITION DIAGNOSIS:   Moderate Malnutrition related to acute illness as evidenced by mild fat depletion, mild muscle depletion.  GOAL:   Patient will meet greater than or equal to 90% of their needs  MONITOR:   PO intake, Supplement acceptance, Vent status, Labs, Weight trends, Skin, I & O's  ASSESSMENT:   70 y.o. 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 fib, fibromyalgia, HTN, osteoporosis, and Hep C brought to ED via EMS from home with hypoxia after a fall at home.  Pt currently on trach collar. Pt seen by SLP 9/1 and placed on a mechanical soft diet. Pt reports today that her appetite is good and she is ready to eat. RD will add supplements and MVI to help pt meet her estimated needs. Pt is likely at refeed risk. Per chart, pt is down 7lbs(6%) since admission.   Medications reviewed and include: cefdinir, insulin, solu-medrol, MVI, protonix  Labs reviewed: K 3.8 wnl, BUN 25(H), P 3.8 wnl, Mg 2.2 wnl Hgb 8.3(L), Hct 26.4(L) Cbgs- 105, 124, 124 x 24 hrs  Diet Order:   Diet Order             DIET DYS 3 Room service appropriate? Yes with Assist; Fluid consistency: Thin  Diet effective now                  EDUCATION NEEDS:   No education needs have been identified at this time  Skin:  Skin Assessment: Reviewed RN Assessment  Last BM:  9/1  Height:   Ht Readings from Last 1 Encounters:  02/18/21 5' (1.524 m)    Weight:   Wt Readings from Last 1 Encounters:  02/23/21 51.2 kg    Ideal Body Weight:  45.5 kg  BMI:  Body  mass index is 22.04 kg/m.  Estimated Nutritional Needs:   Kcal:  1400-1600kcal/day  Protein:  70-80g/day  Fluid:  1.3-1.5L/day  Betsey Holiday MS, RD, LDN Please refer to [facility] for RD and/or RD on-call/weekend/after hours pager

## 2021-02-23 NOTE — Evaluation (Addendum)
Physical Therapy Evaluation Patient Details Name: Virginia [name] MRN: [id number] DOB: Apr 12, 1951 Today's Date: 02/23/2021   History of Present Illness  Pt is a 70 y.o. 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 fib, fibromyalgia, HTN, osteoporosis, and Hep C brought to ED via EMS from home with hypoxia after a fall at home.   Clinical Impression  Patient alert, agreeable to PT with motivation, able to provide PLOF. Stated she lives alone with family to assist nearby and an aide, modI with her rollator for mobility, titrates her oxygen as needed. Reported 2 falls where her legs gave out on her.  Pt on 10L via trach collar throughout session. The patient was able to perform a few supine exercises, vitals WFLs. With encouragement, she transferred to EOB with minA (my have not be required, pt reported fatigue and reached for PT hand). She was able to sit for several minutes with BUE propped and perform some small range LAQ. SpO2 88% or greater. Despite encouragement pt did not want to attempt standing today due to fatigue.  Overall the patient demonstrated deficits (see "PT Problem List") that impede the patient's functional abilities, safety, and mobility and would benefit from skilled PT intervention. Recommendation is SNF due to decline in functional status and recent falls.     Follow Up Recommendations SNF    Equipment Recommendations  None recommended by PT    Recommendations for Other Services    OT consult   Precautions / Restrictions Precautions Precautions: Fall Precaution Comments: trach Restrictions Weight Bearing Restrictions: No      Mobility  Bed Mobility Overal bed mobility: Needs Assistance Bed Mobility: Supine to Sit;Sit to Supine     Supine to sit: Min assist;HOB elevated Sit to supine: HOB elevated   General bed mobility comments: minA provided for patient preference, may not be required    Transfers                 General transfer  comment: pt declined any mobility attempts at this time, agreeable to sit EOB with motivation  Ambulation/Gait             General Gait Details: not tested at this time  Stairs            Wheelchair Mobility    Modified Rankin (Stroke Patients Only)       Balance Overall balance assessment: Needs assistance Sitting-balance support: Feet unsupported;Bilateral upper extremity supported Sitting balance-Leahy Scale: Fair Sitting balance - Comments: pt able to sit with fair balance, but preferred bilateral UE support                                     Pertinent Vitals/Pain Pain Assessment: No/denies pain    Home Living Family/patient expects to be discharged to:: Private residence Living Arrangements: Alone Available Help at Discharge: Family;Personal care attendant;Available PRN/intermittently Type of Home: Apartment Home Access: Level entry     Home Layout: One level Home Equipment: Emergency planning/management officer - 4 wheels;Grab bars - toilet Additional Comments: reported 2 falls in the last 3 months, stated her legs just give out on her    Prior Function Level of Independence: Needs assistance   Gait / Transfers Assistance Needed: modI with rollator  ADL's / Homemaking Assistance Needed: lives alone but family assists with picking up groceries, putting them away, cooking as needed  Comments: 5L at rest, 7-8L  with ambulation (8L on a bad day per pt report)     Hand Dominance        Extremity/Trunk Assessment   Upper Extremity Assessment Upper Extremity Assessment: Generalized weakness    Lower Extremity Assessment Lower Extremity Assessment: Generalized weakness    Cervical / Trunk Assessment Cervical / Trunk Assessment: Kyphotic  Communication   Communication: Passy-Muir valve  Cognition Arousal/Alertness: Awake/alert Behavior During Therapy: WFL for tasks assessed/performed Overall Cognitive Status: Within Functional Limits for tasks  assessed                                        General Comments      Exercises Other Exercises Other Exercises: ankle pumps, heel slides x10 bilaterally, seated short range LAQs x20 with encouragement at EOB   Assessment/Plan    PT Assessment Patient needs continued PT services  PT Problem List Decreased strength;Decreased mobility;Decreased activity tolerance;Decreased balance;Cardiopulmonary status limiting activity       PT Treatment Interventions DME instruction;Therapeutic exercise;Gait training;Balance training;Stair training;Neuromuscular re-education;Functional mobility training;Therapeutic activities;Patient/family education    PT Goals (Current goals can be found in the Care Plan section)  Acute Rehab PT Goals Patient Stated Goal: to get her strength back and go home PT Goal Formulation: With patient Time For Goal Achievement: 03/09/21 Potential to Achieve Goals: Good    Frequency Min 2X/week   Barriers to discharge        Co-evaluation               AM-PAC PT "6 Clicks" Mobility  Outcome Measure Help needed turning from your back to your side while in a flat bed without using bedrails?: A Little Help needed moving from lying on your back to sitting on the side of a flat bed without using bedrails?: A Little Help needed moving to and from a bed to a chair (including a wheelchair)?: A Little Help needed standing up from a chair using your arms (e.g., wheelchair or bedside chair)?: A Little Help needed to walk in hospital room?: A Lot Help needed climbing 3-5 steps with a railing? : A Lot 6 Click Score: 16    End of Session   Activity Tolerance: Patient limited by fatigue Patient left: in bed;with call bell/phone within reach Nurse Communication: Mobility status PT Visit Diagnosis: Other abnormalities of gait and mobility (R26.89);Difficulty in walking, not elsewhere classified (R26.2);Muscle weakness (generalized) (M62.81)    Time:  6195-0932 PT Time Calculation (min) (ACUTE ONLY): 34 min   Charges:   PT Evaluation $PT Eval Moderate Complexity: 1 Mod PT Treatments $Therapeutic Exercise: 8-22 mins $Therapeutic Activity: 8-22 mins       Olga Coaster PT, DPT 3:04 PM,02/23/21

## 2021-02-23 NOTE — Progress Notes (Addendum)
NAME:  Virginia [name], MRN:  [id number], DOB:  20-Jun-1951, LOS: 5 ADMISSION DATE:  02/18/2021, CONSULTATION DATE:  02/18/21 REFERRING MD:  Dr. Lenard Lance, CHIEF COMPLAINT:  Shortness of breath   History of Present Illness:  70 year old female presented to [facility] ED via EMS on 02/18/2021 with acute on chronic combined hypoxic and hypercapnic respiratory failure.   Per son, Virginia [name], patient lives at home alone and is assisted daily by himself and his wife as well as a home health nurse who comes out 3 times a week.  [name] reports it is he and his wife who help put her on her trilogy machine nightly.  The patient normally uses the trilogy as a CPAP mask with the tracheostomy capped overnight and is on 4.5 to 5 L nasal cannula during the day.   The patient's son reported that he and wife came down with COVID-19 last Wednesday, 02/14/2021, and have not been available to help the patient use this machine at night since 02/13/21, and so she has gone without it. [name] stated that his mother and her home health nurse reported to him that she has had multiple falls. He stated that yesterday, on 02/17/21, she fell in the bathroom and was stranded on the floor from 3 am until the home helper arrived hours later. The son, daughter-in-law and home health were concerned about the patient's respiratory status, specifically possible CO2 retention, and EMS was called. The first 2 times the patient refused to go with EMS. The third time however the patient's SpO2 was reading in the 50's with a good waveform, she was placed on CPAP and brought to the ED for evaluation.   PCCM consulted to admit to ICU as patient is requiring mechanical ventilation support.  The patient's mentation has improved but PCO2 after 1 hour is 95.  Pertinent  Medical History  Atrial Fibrillation on Eliquis C. Difficile Severe COPD Chronic tracheostomy Anxiety & depression Hepatitis  C Arthritis HTN Fibromyalgia Anemia CAD Thoracic AAA Significant Hospital Events: Including procedures, antibiotic start and stop dates in addition to other pertinent events   02/18/21: Admit to ICU with acute on chronic combined hypoxic/hypercapnic respiratory failure with chronic tracheostomy requiring mechanical ventilation 8/29 severe copd exacerbation 8/30 did NOT tolerate PS mode, plan for PS mode today 8/31 failed weaning trials, CT ABD  8/30 CT ABD/PELVIS Possible transition point in the central pelvis suggesting partial or developing bowel obstruction over ileus. Distal small bowel and colon are relatively collapsed-casuing failure to wean from vent 9/1 less distended on vent, alert and awake 9/2 wanting to go home, no complaints, feels she is at baseline  Interim History / Subjective:  More alert more awake s tolerated pressure support trial yesterday General surgery following no indication for NG tube placement Follow serial examinations and KUBs End-stage COPD vent dependent Positive COPD exacerbation and hypoxia Objective   Blood pressure (!) 157/96, pulse 73, temperature 98.4 F (36.9 C), temperature source Oral, resp. rate 18, height 5' (1.524 m), weight 51.2 kg, SpO2 97 %.    Vent Mode: PSV FiO2 (%):  [35 %-40 %] 40 % PEEP:  [5 cmH20] 5 cmH20 Pressure Support:  [8 cmH20] 8 cmH20   Intake/Output Summary (Last 24 hours) at 02/23/2021 1407 Last data filed at 02/23/2021 0400 Gross per 24 hour  Intake 200 ml  Output 950 ml  Net -750 ml    Filed Weights   02/21/21 0500 02/22/21 0500 02/23/21 0349  Weight: 53.9 kg 52.3 kg  51.2 kg    Limited ROS due to increased work of breathing shortness of breath   PHYSICAL EXAMINATION:  GENERAL: Chronically ill appearing,  EYES: Pupils equal, round, reactive to light.  No scleral icterus.  MOUTH: Oral mucosa moist, edentulous. NECK: Supple.  Tracheostomy in place, no drainage.  Vocalizing well with Passy-Muir  valve. PULMONARY: Coarse, scattered rhonchi. CARDIOVASCULAR: S1 and S2.  No murmurs  GASTROINTESTINAL: Soft, nontender, non-distended. Positive bowel sounds.  MUSCULOSKELETAL: No swelling, clubbing, or edema.  NEUROLOGIC: Awake, alert, interactive. SKIN:intact,warm,dry   Assessment & Plan:  70 year old former smoker, with acute on chronic hypoxic/hypercapnic respiratory failure due to COPD exacerbation with Enterobacter aerogenes bronchitis.  She has chronic ventilatory dependence at home.  For some odd reason she uses trilogy via mask rather than via trach.  She has done well here with ventilatory support via trach.  Acute on chronic respiratory failure with hypoxia and hypercarbia COPD exacerbation Enterobacter aerogenes bronchitis -continue steroids changed to p.o. x5 days then resume regular dose -continue bronchodilator therapy with DuoNeb as prescribed -Will need nocturnal ventilation with Trilogy via trach -Cefdinir 100 mg twice daily until 26 February 2021 will be a total of 7 days of antibiotics  No evidence of heart failure Paroxysmal SVT vs A-fib RVR s/p adenosine, known issue for patient 2D echo shows no evidence of heart failure Normal LVEF  ACUTE TOXIC METABOLIC ENCEPHALOPATHY Resolved with ventilatory support Avoid sedatives as possible Patient on anxiolytics  GI Mild ileus resolved, no evidence of acute abdomen GI PROPHYLAXIS as indicated NUTRITIONAL STATUS, 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) DIET--> tolerating p.o.'s well  Best Practice (right click and "Reselect all SmartList Selections" daily)  Diet/type: Tolerating p.o.'s well DVT prophylaxis: LMWH GI prophylaxis: PPI Lines: N/A Foley:  N/A Code Status:  full code   Labs   CBC: Recent Labs  Lab 02/19/21 0600 02/20/21 0355 02/21/21 0606 02/22/21 0555 02/23/21 0224  WBC 8.4 9.9 8.1 9.5 8.6  NEUTROABS  --  9.3*  --   --   --   HGB 8.1* 7.6* 7.5* 7.9*  8.3*  HCT 27.6* 24.3* 23.7* 25.9* 26.4*  MCV 106.2* 100.8* 100.0 100.8* 97.8  PLT 189 155 157 167 165     Basic Metabolic Panel: Recent Labs  Lab 02/19/21 0600 02/19/21 1950 02/20/21 0355 02/21/21 0606 02/22/21 0555 02/23/21 0224  NA 142  --  145 143 142 138  K 4.2  --  3.2* 3.5 3.9 3.8  CL 92*  --  94* 95* 97* 96*  CO2 38*  --  40* 37* 36* 34*  GLUCOSE 129*  --  170* 112* 109* 118*  BUN 17  --  20 17 23  25*  CREATININE 0.94  --  0.85 0.73 0.80 0.74  CALCIUM 9.0  --  8.4* 8.5* 8.8* 8.7*  MG 2.0  --  2.0 2.1 2.2 2.2  PHOS 1.1* 3.4 3.8 2.9 4.3 3.8    GFR: Estimated Creatinine Clearance: 47 mL/min (by C-G formula based on SCr of 0.74 mg/dL). Recent Labs  Lab 02/18/21 1905 02/19/21 0600 02/20/21 0355 02/21/21 0606 02/22/21 0555 02/23/21 0224  PROCALCITON 0.98 1.16 0.66  --   --   --   WBC 12.8* 8.4 9.9 8.1 9.5 8.6     Liver Function Tests: Recent Labs  Lab 02/18/21 1905 02/21/21 0606  AST 47*  --   ALT 26  --   ALKPHOS 22*  --   BILITOT 1.4*  --   PROT  6.7  --   ALBUMIN 3.9 2.7*    No results for input(s): LIPASE, AMYLASE in the last 168 hours. No results for input(s): AMMONIA in the last 168 hours.  ABG    Component Value Date/Time   PHART 7.56 (H) 02/19/2021 0500   PCO2ART 46 02/19/2021 0500   PO2ART 88 02/19/2021 0500   HCO3 41.2 (H) 02/19/2021 0500   O2SAT 97.9 02/19/2021 0500      Coagulation Profile: Recent Labs  Lab 02/19/21 0600  INR 1.2     Cardiac Enzymes: Recent Labs  Lab 02/18/21 2254  CKTOTAL 127    Scheduled Meds:  apixaban  5 mg Oral BID   cefdinir  300 mg Oral Q12H   Chlorhexidine Gluconate Cloth  6 each Topical QHS   feeding supplement  237 mL Oral BID BM   insulin aspart  0-9 Units Subcutaneous Q4H   ipratropium-albuterol  3 mL Nebulization Q6H   metoprolol tartrate  5 mg Intravenous Q6H   [START ON 02/24/2021] multivitamin with minerals  1 tablet Oral Daily   pantoprazole (PROTONIX) IV  40 mg Intravenous QHS    [START ON 02/24/2021] predniSONE  20 mg Oral Q breakfast   Continuous Infusions: PRN Meds:.diazepam, docusate sodium, ipratropium-albuterol, morphine injection, polyethylene glycol  CASE DISCUSSED IN MULTIDISCIPLINARY ROUNDS WITH ICU TEAM.  Case management assisting with discharge planning.   Home trilogy vent set to tidal volume of 400, respirations on "auto" and pressure support of 5.  Patient may be discharged home today on home vent.  Will need home vent at to be delivered through tracheostomy not through face mask.  Level 3 follow-up  C. Danice Goltz, MD Advanced Bronchoscopy PCCM [facility]    *This note was dictated using voice recognition software/Dragon.  Despite best efforts to proofread, errors can occur which can change the meaning.  Any change was purely unintentional.

## 2021-02-23 NOTE — Progress Notes (Signed)
ANTICOAGULATION CONSULT NOTE   Pharmacy Consult for Lovenox Dosing Indication: atrial fibrillation  No Known Allergies  Patient Measurements: Height: 5' (152.4 cm) Weight: 51.2 kg (112 lb 14 oz) IBW/kg (Calculated) : 45.5   Vital Signs: Temp: 98 F (36.7 C) (09/02 0400) Temp Source: Oral (09/02 0400) BP: 148/94 (09/02 1100) Pulse Rate: 105 (09/02 1100)  Labs: Recent Labs    02/21/21 0606 02/22/21 0555 02/22/21 1805 02/23/21 0224  HGB 7.5* 7.9*  --  8.3*  HCT 23.7* 25.9*  --  26.4*  PLT 157 167  --  165  HEPRLOWMOCWT  --   --  >2.00 1.42  CREATININE 0.73 0.80  --  0.74     Estimated Creatinine Clearance: 47 mL/min (by C-G formula based on SCr of 0.74 mg/dL).   Medical History: Past Medical History:  Diagnosis Date   Anemia    Anxiety    Arthritis    Atrial fibrillation (HCC)    C. difficile colitis 09/2015   COPD (chronic obstructive pulmonary disease) (HCC)    Depression    Dyspnea    Dysrhythmia    Fibromyalgia    H/O tracheostomy    Hep C w/ coma, chronic    Hypertension    MRSA pneumonia (HCC) 2017   On home oxygen therapy    3 L/M    Osteoporosis    Peripheral neuropathy    RLS (restless legs syndrome)    S/P percutaneous endoscopic gastrostomy (PEG) tube placement (HCC) 09/2015   Ventilator associated pneumonia (HCC) 10/2015   [facility], [place]    Medications:  PTA Meds: Apixaban 5mg  BID  Assessment: Pt is 70 yo w/ history of A fib, presenting to ED with acute on chronic combined hypoxic and hypercapnic respiratory failure requiring Mechanical ventilation. Imaging c/f partial SBO < Ileus on 8/30, but no surgical intervention planned currently, pt remains NPO, serial KUBs, limit opiates.   Labs: INR - 1.2 @BL  (PTA eliquis) Hgb - 8.4>8.1>7.6>7.5>7.9 (stabilized/low) Plts - 195>189>155>157>167 (LLN/stable)  9/2:  anti-Xa (LMWH) = 1.42  Goal of Therapy:  Anti-Xa level 0.6-1 units/ml 4hrs after LMWH dose given Monitor platelets  by anticoagulation protocol: Yes   Plan:  Anti Xa level on 9/1 @1805  >2.0; 9/2 @0224  1.42. Stopped Lovenox 1 mg/kg q12h; now planning to resume home eliquis.  Recheck anti-Xa level (1800) prior to ordered eliquis dose (2200).  If level < 0.6, continue eliquis as ordered. CBC per protocol   11/1, PharmD, [facility] 02/23/2021 11:35 AM

## 2021-02-23 NOTE — TOC Progression Note (Addendum)
Transition of Care [facility]) - Progression Note    Patient Details  Name: MAELYNN MORONEY MRN: [id number] Date of Birth: [birth year]  Transition of [facility] CM/SW Contact  Hetty Ely, RN Phone Number: 02/23/2021, 12:34 PM  Clinical Narrative:  Spoke with patient about Trilogy settings and if she use both oxygen and trilogy at night. Patient says she was not sure to call Son and Daughter in Social worker. Called [facility] spoke with Verlon Au, who will have therapist to call me with settings and usage instructions. Family called the unit with request for PT evaluation due to frequent falls at home. Attending notified and will place order.  Called and spoke with Lazarus Salines [phone] who says patient is to cork the trach at night and use via face mask Trilogy tidal vol. 400, Pressure support @5 , respirations on auto. Called and spoke with daughter in law who says Trilogy is used during the day 4-6 hrs with Trach corked, however oxygen in use. Daughter in law says she feels that Trilogy would work better if used with trach and not by mask. Information forwarded to Attending.  Attending wrote the order for patient to have Trilogy via trach when discharged. [name] from [facility] notified. [facility] is unable to get equipment needed until Wednesday, Attending notified will not discharge until Wednesday. Photo of trach size sent to [facility] to be used to order equipment. Message left with son about discharge date.   PT recommends SNF for discharge, discussed with patient and she declines. Says PT evaluated her when she was just waking up and she can do better at home. Attending notified.    Expected Discharge Plan: Home w Home Health Services Barriers to Discharge: Continued Medical Work up  Expected Discharge Plan and Services Expected Discharge Plan: Home w Home Health Services In-house Referral: Clinical Social Work   Post Acute Care Choice: Durable Medical Equipment Living arrangements  for the past 2 months: Apartment                                       Social Determinants of Health (SDOH) Interventions    Readmission Risk Interventions No flowsheet data found.

## 2021-02-23 NOTE — Progress Notes (Signed)
ANTICOAGULATION CONSULT NOTE   Pharmacy Consult for Lovenox Dosing Indication: atrial fibrillation  No Known Allergies  Patient Measurements: Height: 5' (152.4 cm) Weight: 52.3 kg (115 lb 4.8 oz) IBW/kg (Calculated) : 45.5   Vital Signs: Temp: 98.6 F (37 C) (09/02 0000) Temp Source: Oral (09/02 0000) BP: 151/78 (09/02 0000) Pulse Rate: 70 (09/02 0000)  Labs: Recent Labs    02/21/21 0606 02/22/21 0555 02/22/21 1805 02/23/21 0224  HGB 7.5* 7.9*  --  8.3*  HCT 23.7* 25.9*  --  26.4*  PLT 157 167  --  165  HEPRLOWMOCWT  --   --  >2.00 1.42  CREATININE 0.73 0.80  --  0.74     Estimated Creatinine Clearance: 47 mL/min (by C-G formula based on SCr of 0.74 mg/dL).   Medical History: Past Medical History:  Diagnosis Date   Anemia    Anxiety    Arthritis    Atrial fibrillation (HCC)    C. difficile colitis 09/2015   COPD (chronic obstructive pulmonary disease) (HCC)    Depression    Dyspnea    Dysrhythmia    Fibromyalgia    H/O tracheostomy    Hep C w/ coma, chronic    Hypertension    MRSA pneumonia (HCC) 2017   On home oxygen therapy    3 L/M    Osteoporosis    Peripheral neuropathy    RLS (restless legs syndrome)    S/P percutaneous endoscopic gastrostomy (PEG) tube placement (HCC) 09/2015   Ventilator associated pneumonia (HCC) 10/2015   [facility], [place]    Medications:  PTA Meds: Apixaban 5mg  BID  Assessment: Pt is 70 yo w/ history of A fib, presenting to ED with acute on chronic combined hypoxic and hypercapnic respiratory failure requiring Mechanical ventilation. Imaging c/f partial SBO < Ileus on 8/30, but no surgical intervention planned currently, pt remains NPO, serial KUBs, limit opiates.   Labs: INR - 1.2 @BL  (PTA eliquis) Hgb - 8.4>8.1>7.6>7.5>7.9 (stabilized/low) Plts - 195>189>155>157>167 (LLN/stable)  9/2:  anti-Xa (LMWH) = 1.42  Goal of Therapy:  Anti-Xa level 0.6-1 units/ml 4hrs after LMWH dose given Monitor platelets  by anticoagulation protocol: Yes   Plan:  Anti Xa level on 9/1 @1805  >2.0. Hold Lovenox 1 mg/kg q12h until anti-Xa <0.5, then decrease dose by 40%. Recheck anti-Xa level prior to next dose.  CBC per protocol   9/2:  LMWH @ 0224 = 1.42 Will hold lovenox dose for now and recheck anti-Xa on 9/2 @ 1400.   Tanylah Schnoebelen D, PharmD 02/23/2021 3:07 AM

## 2021-02-23 NOTE — Progress Notes (Signed)
Speech Language Pathology Treatment: Dysphagia;Passy Muir Speaking valve  Patient Details Name: Virginia [name] MRN: [id number] DOB: 30-Nov-1950 Today's Date: 02/23/2021 Time: 8295-6213 SLP Time Calculation (min) (ACUTE ONLY): 60 min  Assessment / Plan / Recommendation Clinical Impression  PMV TX; Dysphagia TX.   Pt seen for both PMV and po trials for toleration of diet today. Pt has a Chronic Tracheostomy and has used the PMV at home previously per chart notes -- pt has been seen by ST services previously. Pt continues on TC wean today per RT since this AM. Pt was alert, talkative during session; suspect possible min Cognitive decline; distracted and required verbal cues for follow through w/ tasks at times.    Explained the use and wear of the PMV to pt; trach and stoma area inspected; Shiley Cuffed trach #6. Pt's respiratory effort appeared min increased w/ any exertion including Talking and Moving to sit upright in bed. RR: 18-23, O2 sats 96-98%, HR upper 70s-80s. Cuff deflated at baseline w/ PMV placed since this AM per RT. Pt verbalized indicating her desire to have coffee. Verbalizations and conversation during session were c/b grossly adequate volume and intelligibility w/ fair+ breath support/effort, though min strained, gravely vocal quality present. Encouraged pt to focus on slowing down during conversation and using her breath support to support her speech/volume. PMV placement was tolerated during sessions w/out noted O2 desaturation, or significant change in RR/HR from his baseline. No overt discomfort noted in her respiratory effort -- pt stated it felt "fine" to wear/talk w/. No increased effort noted in respirations during the expiratory phase; no overt use of accessary muscles or distress was noted in his breathing pattern. PMV was allowed to remain on as pt consumed po's.   Pt appears to adequately tolerate PMV placement w/out overt, gross respiratory discomfort or distress; ANS  remained adequately stable at her Baseline during wear/use. Suspect Shiley trach size, #6, is beneficial for comfortable PMV wear/use.  Much education was given on PMV use/wear, MUST have Cuff deflation for PMV wear, checking and removing the air from the balloon b/f placing, placing/removing the PMV, and care of the PMV. Discussed that is MUST be worn for all eating/drinking; and can be work w/ Therapies(PT). Must NOT be worn when sleeping -- pt recalled this strategy quickly during session. Encouraged Rest Breaks at times during the day. Precautions posted at bedside and in chart.  Pt remains w/ a Cuffed trach during her admit at this time; MD will f/u on any transition to a cuffless trach at d/c. Sticker for PMV precautions and deflation of Cuff placed on Cuff line, and in room at this time. NSG made aware. MD updated. ST services can be reconsulted for any further needs while admitted.     DYSPHAGIA TREATMENT:  Pt seen for ongoing assessment of swallowing. She is able to wear the PMV comfortably for verbal communication and now for po intake. See session noted above.  Pt explained general aspiration precautions and agreed verbally to the need for following them especially sitting upright for all oral intake, No Talking w/ food and drink in mouth, and wearing PMV for ALL oral intake. Pt assisted w/ sitting up and using multiple pillows behind her for a forward position. Noted min increased RR rate and effort d/t the moving in bed. After Rest Break, she was then given trials of thin liquids -- coffee. NO overt clinical s/s of aspiration were noted w/ trials; No coughing, respiratory status remained relatively calm and overtly  unlabored w/ Rest Breaks(as needed), vocal quality clear b/t trials. O2 sats 97%; HR 81; RR low 20s. Pt held Cup when drinking and fed self following instructions for single, small sips slowly given intermittent verbal cues. Straws were NOT utilized for better oral and Pulmonary control  -- does not use at baseline. Oral phase appeared [facility] for bolus management and timely A-P transfer for swallowing. Oral clearing achieved w/ all consistencies given Time. Pt is Missing Dentition for full, effective mastication.     Recommended continue a Dysphagia level 3 diet (mech soft foods) w/ gravies added to moisten foods; Thin liquids via CUP only. Recommend aspiration precautions; Pills WHOLE in Puree d/t Pulmonary status and for ease of swallowing; tray setup and positioning assistance Upright for meals. PMV MUST BE PLACED FOR ALL ORAL INTAKE. Monitoring w/ all oral intake. ST services can be available for further needs or education on precautions while admitted. NSG, NP, and MD updated on above. Precautions posted at bedside for PMV wear/use and aspiration precautions; given to pt. ST services can be reconsulted for any new needs while admitted.      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 Per son, Virginia [name], patient lives at home alone and is assisted daily by himself and his wife as well as a home health nurse who comes out 3 times a week. [name] reports it is he and his wife who help put her on her trilogy machine nightly. The patient normally uses the trilogy as a CPAP mask with the tracheostomy capped overnight and is on 4.5 to 5 L nasal cannula during the day.  The patient's son reported that he and wife came down with COVID-19 last Wednesday, and have not been available to help the patient use this machine at night since 02/13/21, and so she has gone without it. Son and her home health nurse reported that pt has had multiple falls. He stated that yesterday, on 02/17/21, she fell in the bathroom and was stranded on the floor from 3 am until the home helper arrived hours later. The son, daughter-in-law and home health were concerned about the patient's respiratory status, specifically  possible CO2 retention, and EMS was called. The first 2 times the patient refused to go with EMS. The third time however the patient's SpO2 was reading in the 50's with a good waveform, she was placed on CPAP and brought to the ED for evaluation.     ED course: Upon arrival in the ED patient was transitioned from CPAP to mechanical ventilation via chronic tracheostomy. Shortly after arrival patient became extremely tachycardic with heart rates in the 200s and what appeared to be SVT.  RT reported that the trach in place was fairly clogged with mucus and was changed out - cuffed tracheostomy.      SLP Plan  All goals met       Recommendations  Diet recommendations: Dysphagia 3 (mechanical soft);Thin liquid Liquids provided via: Cup Medication Administration: Whole meds with puree (for safer swallowing) Supervision: Patient able to self feed;Intermittent supervision to cue for compensatory strategies (setup) Compensations: Minimize environmental distractions;Slow rate;Small sips/bites;Lingual sweep for clearance of pocketing;Follow solids with liquid Postural Changes and/or Swallow Maneuvers: Out of bed for meals;Seated upright 90 degrees;Upright 30-60 min after meal      Patient may use Passy-Muir Speech Valve: During all therapies with supervision;During all waking hours (remove during sleep);During  PO intake/meals;Caregiver trained to provide supervision PMSV Supervision: Intermittent MD: Please consider changing trach tube to :  (n/a)         General recommendations: PT consult Oral Care Recommendations: Oral care BID;Oral care before and after PO;Patient independent with oral care Follow up Recommendations: None SLP Visit Diagnosis: Dysphagia, unspecified (R13.10);Aphonia (R49.1) (tracheostomy) Plan: All goals met       GO                  Orinda Kenner, MS, CCC-SLP Speech Language Pathologist Rehab Services [phone] [facility] 02/23/2021, 2:24 PM

## 2021-02-24 DIAGNOSIS — J9621 Acute and chronic respiratory failure with hypoxia: Secondary | ICD-10-CM | POA: Diagnosis not present

## 2021-02-24 DIAGNOSIS — J9622 Acute and chronic respiratory failure with hypercapnia: Secondary | ICD-10-CM | POA: Diagnosis not present

## 2021-02-24 LAB — BASIC METABOLIC PANEL
Anion gap: 5 (ref 5–15)
BUN: 21 mg/dL (ref 8–23)
CO2: 38 mmol/L — ABNORMAL HIGH (ref 22–32)
Calcium: 9 mg/dL (ref 8.9–10.3)
Chloride: 99 mmol/L (ref 98–111)
Creatinine, Ser: 0.72 mg/dL (ref 0.44–1.00)
GFR, Estimated: >60 mL/min (ref >60)
Glucose, Bld: 83 mg/dL (ref 70–99)
Potassium: 3.3 mmol/L — ABNORMAL LOW (ref 3.5–5.1)
Sodium: 142 mmol/L (ref 135–145)

## 2021-02-24 LAB — CBC
HCT: 26.4 % — ABNORMAL LOW (ref 36.0–46.0)
Hemoglobin: 8.2 g/dL — ABNORMAL LOW (ref 12.0–15.0)
MCH: 30.9 pg (ref 26.0–34.0)
MCHC: 31.1 g/dL (ref 30.0–36.0)
MCV: 99.6 fL (ref 80.0–100.0)
Platelets: 197 10*3/uL (ref 150–400)
RBC: 2.65 MIL/uL — ABNORMAL LOW (ref 3.87–5.11)
RDW: 15.4 % (ref 11.5–15.5)
WBC: 7.7 10*3/uL (ref 4.0–10.5)
nRBC: 0.3 % — ABNORMAL HIGH (ref 0.0–0.2)

## 2021-02-24 LAB — GLUCOSE, CAPILLARY
Glucose-Capillary: 143 mg/dL — ABNORMAL HIGH (ref 70–99)
Glucose-Capillary: 181 mg/dL — ABNORMAL HIGH (ref 70–99)
Glucose-Capillary: 84 mg/dL (ref 70–99)
Glucose-Capillary: 88 mg/dL (ref 70–99)
Glucose-Capillary: 91 mg/dL (ref 70–99)
Glucose-Capillary: 95 mg/dL (ref 70–99)

## 2021-02-24 LAB — PHOSPHORUS: Phosphorus: 3.5 mg/dL (ref 2.5–4.6)

## 2021-02-24 LAB — MAGNESIUM: Magnesium: 2.2 mg/dL (ref 1.7–2.4)

## 2021-02-24 MED ORDER — POTASSIUM CHLORIDE 10 MEQ/100ML IV SOLN
10.0000 meq | INTRAVENOUS | Status: AC
Start: 2021-02-24 — End: 2021-02-24
  Administered 2021-02-24 (×4): 10 meq via INTRAVENOUS
  Filled 2021-02-24 (×4): qty 100

## 2021-02-24 MED ORDER — POTASSIUM CHLORIDE CRYS ER 20 MEQ PO TBCR
40.0000 meq | EXTENDED_RELEASE_TABLET | Freq: Once | ORAL | Status: AC
  Administered 2021-02-24: 40 meq via ORAL
  Filled 2021-02-24: qty 2

## 2021-02-24 NOTE — Progress Notes (Signed)
Pt care assumed. Pt placed on psv initially to rest overnight. Pt alarm for low tv and low rr alarming. Rn made aware, notified by NP to place pt on set rate overnight. Pt rested on set rate w/o issue. Will follow .Vent Mode: PRVC FiO2 (%):  [35 %-40 %] 40 % Set Rate:  [16 bmp] 16 bmp Vt Set:  [400 mL] 400 mL PEEP:  [5 cmH20] 5 cmH20 Pressure Support:  [8 cmH20] 8 cmH20

## 2021-02-24 NOTE — Progress Notes (Signed)
Speech Language Pathology Treatment: Dysphagia  Patient Details Name: Virginia [name] MRN: [id number] DOB: 18-Apr-1951 Today's Date: 02/24/2021 Time: 0900-0940 SLP Time Calculation (min) (ACUTE ONLY): 40 min  Assessment / Plan / Recommendation Clinical Impression  Pt seen for both f/u w/ PMV and toleration of diet; education w/ aspiration precautions and PMV use today. Pt has a Chronic Tracheostomy and has used the PMV at home previously per chart notes -- pt has been seen by ST services previously. Pt continues on TC during the day per RT w/ nocturnal vent support in evenings per MD. Pt was alert, talkative during session; suspect possible min Cognitive decline; distracted and required verbal cues for follow through w/ tasks at times.   No overt discomfort noted in her respiratory effort -- pt stated it felt "fine" to wear/talk w/. No increased effort noted in respirations during the expiratory phase; no overt use of accessary muscles or distress was noted in his breathing pattern. Pt appears to adequately tolerate PMV placement w/out overt, gross respiratory discomfort or distress; ANS remained adequately stable at her Baseline during wear/use. Suspect Shiley trach size, #6, is beneficial for comfortable PMV wear/use.  Education reviewed/discussed on PMV use/wear, MUST have Cuff deflation for PMV wear, checking and removing the air from the balloon b/f placing, placing/removing the PMV, and care of the PMV. Discussed that is MUST be worn for all eating/drinking; and can be work w/ Therapies(PT). Must NOT be worn when sleeping -- pt recalled this strategy quickly during session. Encouraged Rest Breaks at times during the day. Precautions posted at bedside and in chart.  Pt remains w/ a Cuffed trach during her admit at this time; MD will f/u on any transition to a cuffless trach at d/c. Sticker for PMV precautions and deflation of Cuff placed on Cuff line, and in room at this time. NSG made aware. MD  updated. ST services can be reconsulted for any further needs while admitted. DYSPHAGIA TREATMENT:  Pt seen for ongoing assessment of swallowing. She is able to wear the PMV comfortably for verbal communication and now for po intake. See session noted above.  Pt explained general aspiration precautions and agreed verbally to the need for following them especially sitting upright for all oral intake, No Talking w/ food and drink in mouth, and wearing PMV for ALL oral intake. Pt assisted w/ sitting up and using multiple pillows behind her for a forward position. Noted min increased RR rate and effort d/t the moving in bed. After Rest Break, she was then given trials of thin liquids -- coffee. Pt also fed self a banana. NO overt clinical s/s of aspiration were noted w/ trials; No coughing, respiratory status remained relatively calm and overtly unlabored w/ Rest Breaks(as needed), vocal quality clear b/t trials. O2 sats 97%; HR 81; RR low 20s. Pt held Cup when drinking and fed self following instructions for single, small sips slowly given intermittent verbal cues. Straws were NOT utilized for better oral and Pulmonary control -- does not use at baseline. Oral phase appeared [facility] for bolus management and timely A-P transfer for swallowing. Oral clearing achieved w/ all consistencies given Time. Pt is Missing Dentition for full, effective mastication.     Recommended continue a Dysphagia level 3 diet (mech soft foods) w/ gravies added to moisten foods; Thin liquids via CUP only. Recommend aspiration precautions; Pills WHOLE in Puree d/t Pulmonary status and for ease of swallowing; tray setup and positioning assistance Upright for meals. PMV MUST BE PLACED  FOR ALL ORAL INTAKE. Monitoring w/ all oral intake. ST services can be available for further needs or education on precautions while admitted. NSG, NP, and MD updated on above. Precautions posted at bedside for PMV wear/use and aspiration precautions; given to pt.  ST services can be reconsulted for any new needs while admitted.     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 Per son, Virginia [name], patient lives at home alone and is assisted daily by himself and his wife as well as a home health nurse who comes out 3 times a week. [name] reports it is he and his wife who help put her on her trilogy machine nightly. The patient normally uses the trilogy as a CPAP mask with the tracheostomy capped overnight and is on 4.5 to 5 L nasal cannula during the day.  The patient's son reported that he and wife came down with COVID-19 last Wednesday, and have not been available to help the patient use this machine at night since 02/13/21, and so she has gone without it. Son and her home health nurse reported that pt has had multiple falls. He stated that yesterday, on 02/17/21, she fell in the bathroom and was stranded on the floor from 3 am until the home helper arrived hours later. The son, daughter-in-law and home health were concerned about the patient's respiratory status, specifically possible CO2 retention, and EMS was called. The first 2 times the patient refused to go with EMS. The third time however the patient's SpO2 was reading in the 50's with a good waveform, she was placed on CPAP and brought to the ED for evaluation.     ED course: Upon arrival in the ED patient was transitioned from CPAP to mechanical ventilation via chronic tracheostomy. Shortly after arrival patient became extremely tachycardic with heart rates in the 200s and what appeared to be SVT.  RT reported that the trach in place was fairly clogged with mucus and was changed out - cuffed tracheostomy.      SLP Plan  All goals met       Recommendations  Diet recommendations: Thin liquid;Dysphagia 3 (mechanical soft) Liquids provided via: Cup Medication Administration: Whole meds with puree (for  safer swallowing) Supervision: Patient able to self feed;Intermittent supervision to cue for compensatory strategies (setup) Compensations: Minimize environmental distractions;Slow rate;Small sips/bites;Lingual sweep for clearance of pocketing;Follow solids with liquid Postural Changes and/or Swallow Maneuvers: Out of bed for meals;Seated upright 90 degrees;Upright 30-60 min after meal      Patient may use Passy-Muir Speech Valve: During all therapies with supervision;During all waking hours (remove during sleep);During PO intake/meals;Caregiver trained to provide supervision PMSV Supervision: Intermittent MD: Please consider changing trach tube to :  (n/a)         Oral Care Recommendations: Oral care BID;Oral care before and after PO;Patient independent with oral care Follow up Recommendations: None SLP Visit Diagnosis: Dysphagia, unspecified (R13.10);Aphonia (R49.1) (tracheostomy) Plan: All goals met       [place], MS, CCC-SLP Speech Language Pathologist Rehab Services [phone] [facility] 02/24/2021, 10:12 AM

## 2021-02-24 NOTE — Progress Notes (Addendum)
NAME:  Virginia [name], MRN:  [id number], DOB:  12-04-1950, LOS: 6 ADMISSION DATE:  02/18/2021, CONSULTATION DATE:  02/18/21 REFERRING MD:  Dr. Lenard Lance, CHIEF COMPLAINT:  Shortness of breath   History of Present Illness:  70 year old female presented to [facility] ED via EMS on 02/18/2021 with acute on chronic combined hypoxic and hypercapnic respiratory failure.   Per son, Jadamarie Butson, patient lives at home alone and is assisted daily by himself and his wife as well as a home health nurse who comes out 3 times a week.  [name] reports it is he and his wife who help put her on her trilogy machine nightly.  The patient normally uses the trilogy as a CPAP mask with the tracheostomy capped overnight and is on 4.5 to 5 L nasal cannula during the day.   The patient's son reported that he and wife came down with COVID-19 last Wednesday, 02/14/2021, and have not been available to help the patient use this machine at night since 02/13/21, and so she has gone without it. Lya Holben stated that his mother and her home health nurse reported to him that she has had multiple falls. He stated that yesterday, on 02/17/21, she fell in the bathroom and was stranded on the floor from 3 am until the home helper arrived hours later. The son, daughter-in-law and home health were concerned about the patient's respiratory status, specifically possible CO2 retention, and EMS was called. The first 2 times the patient refused to go with EMS. The third time however the patient's SpO2 was reading in the 50's with a good waveform, she was placed on CPAP and brought to the ED for evaluation.   PCCM consulted to admit to ICU as patient is requiring mechanical ventilation support.  The patient's mentation has improved but PCO2 after 1 hour is 95.  Pertinent  Medical History  Atrial Fibrillation on Eliquis C. Difficile Severe COPD Chronic tracheostomy Anxiety & depression Hepatitis  C Arthritis HTN Fibromyalgia Anemia CAD Thoracic AAA Significant Hospital Events: Including procedures, antibiotic start and stop dates in addition to other pertinent events   02/18/21: Admit to ICU with acute on chronic combined hypoxic/hypercapnic respiratory failure with chronic tracheostomy requiring mechanical ventilation 8/29 severe copd exacerbation 8/30 did NOT tolerate PS mode, plan for PS mode today 8/31 failed weaning trials, CT ABD  8/30 CT ABD/PELVIS Possible transition point in the central pelvis suggesting partial or developing bowel obstruction over ileus. Distal small bowel and colon are relatively collapsed-casuing failure to wean from vent 9/1 less distended on vent, alert and awake 9/2 wanting to go home, no complaints, feels she is at baseline 9/3 unable to go home yesterday due to need to have vent connected to trach (patient has been using CPAP mask), episodes of triggering the vent on pressure support last night, she will need a rate nocturnally  Interim History / Subjective:  Awake and alert this morning looks rested, slept with rate last night General surgery signed off, no evidence of abdominal process End-stage COPD vent dependent COPD exacerbation due to Enterobacter aerogenes bronchitis and hypoxia/hypercarbia  She does not endorse any complaints this morning, notes that she feels more rested after sleeping with ventilator set to a rate.  Understands that she needs to have all ventilation done through the trach.  Objective   Blood pressure (!) 130/97, pulse 100, temperature 98.6 F (37 C), temperature source Oral, resp. rate 15, height 5' (1.524 m), weight 52 kg, SpO2 90 %.  Vent Mode: PRVC FiO2 (%):  [40 %-60 %] 60 % Set Rate:  [16 bmp] 16 bmp Vt Set:  [400 mL] 400 mL PEEP:  [5 cmH20] 5 cmH20   Intake/Output Summary (Last 24 hours) at 02/24/2021 1004 Last data filed at 02/24/2021 0919 Gross per 24 hour  Intake 480 ml  Output 575 ml  Net -95 ml     Filed Weights   02/22/21 0500 02/23/21 0349 02/24/21 0454  Weight: 52.3 kg 51.2 kg 52 kg    ROS: A 10 point review of systems was performed and it is as noted above otherwise negative.  PHYSICAL EXAMINATION:  GENERAL: Chronically ill appearing, chronic use of accessories of respiration. EYES: Pupils equal, round, reactive to light.  No scleral icterus.  MOUTH: Oral mucosa moist, edentulous. NECK: Supple.  Tracheostomy in place, no drainage.  Vocalizing well with Passy-Muir valve. PULMONARY: Coarse, rhonchi or wheezes noted. CARDIOVASCULAR: S1 and S2.  No murmurs  GASTROINTESTINAL: Soft, nontender, non-distended. Positive bowel sounds.  MUSCULOSKELETAL: No swelling, clubbing, or edema.  NEUROLOGIC: Awake, alert, interactive. SKIN:intact,warm,dry   Assessment & Plan:  70 year old former smoker, with acute on chronic hypoxic/hypercapnic respiratory failure due to COPD exacerbation with Enterobacter aerogenes bronchitis.  She has chronic ventilatory dependence at home.  For some odd reason she uses Trilogy vent via mask rather than via trach.  She has done well here with ventilatory support via trach.  Acute on chronic respiratory failure with hypoxia and hypercarbia COPD exacerbation Enterobacter aerogenes bronchitis -continue steroids changed to p.o. x5 days then resume regular dose of 5 mg daily -continue bronchodilator therapy with DuoNeb as prescribed -No need for Trelegy Ellipta, should have all bronchodilators through neb via trach -Will need nocturnal ventilation with Trilogy via trach WITH SET RATE 12 AS BACKUP -Cefdinir 100 mg twice daily until 26 February 2021 will be a total of 7 days of antibiotics  No evidence of heart failure Paroxysmal SVT vs A-fib RVR s/p adenosine, known issue for patient 2D echo shows no evidence of heart failure Normal LVEF No recurrence of SVT/A. 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 DVT prophylaxis: LMWH GI prophylaxis: PPI Lines: N/A Foley:  N/A Code Status:  full code   Labs   CBC: Recent Labs  Lab 02/20/21 0355 02/21/21 0606 02/22/21 0555 02/23/21 0224 02/24/21 0520  WBC 9.9 8.1 9.5 8.6 7.7  NEUTROABS 9.3*  --   --   --   --   HGB 7.6* 7.5* 7.9* 8.3* 8.2*  HCT 24.3* 23.7* 25.9* 26.4* 26.4*  MCV 100.8* 100.0 100.8* 97.8 99.6  PLT 155 157 167 165 197     Basic Metabolic Panel: Recent Labs  Lab 02/20/21 0355 02/21/21 0606 02/22/21 0555 02/23/21 0224 02/24/21 0520  NA 145 143 142 138 142  K 3.2* 3.5 3.9 3.8 3.3*  CL 94* 95* 97* 96* 99  CO2 40* 37* 36* 34* 38*  GLUCOSE 170* 112* 109* 118* 83  BUN 20 17 23  25* 21  CREATININE 0.85 0.73 0.80 0.74 0.72  CALCIUM 8.4* 8.5* 8.8* 8.7* 9.0  MG 2.0 2.1 2.2 2.2 2.2  PHOS  3.8 2.9 4.3 3.8 3.5    GFR: Estimated Creatinine Clearance: 47 mL/min (by C-G formula based on SCr of 0.72 mg/dL). Recent Labs  Lab 02/18/21 1905 02/19/21 0600 02/20/21 0355 02/21/21 0606 02/22/21 0555 02/23/21 0224 02/24/21 0520  PROCALCITON 0.98 1.16 0.66  --   --   --   --   WBC 12.8* 8.4 9.9 8.1 9.5 8.6 7.7     Liver Function Tests: Recent Labs  Lab 02/18/21 1905 02/21/21 0606  AST 47*  --   ALT 26  --   ALKPHOS 22*  --   BILITOT 1.4*  --   PROT 6.7  --   ALBUMIN 3.9 2.7*    No results for input(s): LIPASE, AMYLASE in the last 168 hours. No results for input(s): AMMONIA in the last 168 hours.  ABG    Component Value Date/Time   PHART 7.56 (H) 02/19/2021 0500   PCO2ART  46 02/19/2021 0500   PO2ART 88 02/19/2021 0500   HCO3 41.2 (H) 02/19/2021 0500   O2SAT 97.9 02/19/2021 0500      Coagulation Profile: Recent Labs  Lab 02/19/21 0600  INR 1.2     Cardiac Enzymes: Recent Labs  Lab 02/18/21 2254  CKTOTAL 127    Scheduled Meds:  apixaban  5 mg Oral BID   cefdinir  300 mg Oral Q12H   Chlorhexidine Gluconate Cloth  6 each Topical QHS   feeding supplement  237 mL Oral BID BM   insulin aspart  0-9 Units Subcutaneous Q4H   ipratropium-albuterol  3 mL Nebulization Q6H   metoprolol tartrate  5 mg Intravenous Q6H   multivitamin with minerals  1 tablet Oral Daily   pantoprazole (PROTONIX) IV  40 mg Intravenous QHS   predniSONE  20 mg Oral Q breakfast   Continuous Infusions:  potassium chloride 10 mEq (02/24/21 0919)   PRN Meds:.albuterol, diazepam, docusate sodium, HYDROcodone-acetaminophen, ipratropium-albuterol, polyethylene glycol  Disposition: Case management assisting with discharge planning.  Patient's DME company is [employer], they cannot have a equipment that the patient needs at home until Wednesday 7 September patient will need to remain in ICU until then. Home trilogy vent will need to be set to tidal volume of 400, respiratory rate of 12 as backup demand assist-control mode  Patient anticipate discharge on 9/7 on home vent.  Will need home vent at to be delivered through tracheostomy not through face mask.  Follow-up with Dr. Meredeth Ide primary pulmonologist  Level 3 follow-up  C. Danice Goltz, MD Advanced Bronchoscopy PCCM [facility]    *This note was dictated using voice recognition software/Dragon.  Despite best efforts to proofread, errors can occur which can change the meaning.  Any change was purely unintentional.

## 2021-02-24 NOTE — Progress Notes (Signed)
PHARMACY CONSULT NOTE  Pharmacy Consult for Electrolyte Monitoring and Replacement   Recent Labs: Potassium (mmol/L)  Date Value  02/24/2021 3.3 (L)   Magnesium (mg/dL)  Date Value  62/83/6629 2.2   Calcium (mg/dL)  Date Value  47/65/4650 9.0   Albumin (g/dL)  Date Value  35/46/5681 2.7 (L)   Phosphorus (mg/dL)  Date Value  27/51/7001 3.5   Sodium (mmol/L)  Date Value  02/24/2021 142     Assessment: 70 y.o. female with a past medical history of COPD on 3 L nasal cannula, anxiety, fibromyalgia, hypertension, atrial fibrillation, AAA presents to the emergency department for shortness of breath. Pharmacy is asked to follow and replace electrolytes while in the CCU.  Goal of Therapy:  Potassium 4.0 - 5.1 mmol/L Magnesium 2.0 - 2.4 mg/dL All Other Electrolytes WNL  Plan:  Medical team replace potassium with 10 mEq IV x 4 + Kcl 40 mEq x 1.  Re-check electrolytes in AM  Ronnald Ramp ,PharmD,  Clinical Pharmacist 02/24/2021 7:16 AM

## 2021-02-25 DIAGNOSIS — J9621 Acute and chronic respiratory failure with hypoxia: Secondary | ICD-10-CM | POA: Diagnosis not present

## 2021-02-25 DIAGNOSIS — J9622 Acute and chronic respiratory failure with hypercapnia: Secondary | ICD-10-CM | POA: Diagnosis not present

## 2021-02-25 LAB — BASIC METABOLIC PANEL
Anion gap: 4 — ABNORMAL LOW (ref 5–15)
BUN: 22 mg/dL (ref 8–23)
CO2: 37 mmol/L — ABNORMAL HIGH (ref 22–32)
Calcium: 8.9 mg/dL (ref 8.9–10.3)
Chloride: 96 mmol/L — ABNORMAL LOW (ref 98–111)
Creatinine, Ser: 0.77 mg/dL (ref 0.44–1.00)
GFR, Estimated: >60 mL/min (ref >60)
Glucose, Bld: 85 mg/dL (ref 70–99)
Potassium: 4.7 mmol/L (ref 3.5–5.1)
Sodium: 137 mmol/L (ref 135–145)

## 2021-02-25 LAB — CBC
HCT: 26.9 % — ABNORMAL LOW (ref 36.0–46.0)
Hemoglobin: 8.2 g/dL — ABNORMAL LOW (ref 12.0–15.0)
MCH: 29.9 pg (ref 26.0–34.0)
MCHC: 30.5 g/dL (ref 30.0–36.0)
MCV: 98.2 fL (ref 80.0–100.0)
Platelets: 225 10*3/uL (ref 150–400)
RBC: 2.74 MIL/uL — ABNORMAL LOW (ref 3.87–5.11)
RDW: 15.6 % — ABNORMAL HIGH (ref 11.5–15.5)
WBC: 9.7 10*3/uL (ref 4.0–10.5)
nRBC: 0.3 % — ABNORMAL HIGH (ref 0.0–0.2)

## 2021-02-25 LAB — PHOSPHORUS: Phosphorus: 3.4 mg/dL (ref 2.5–4.6)

## 2021-02-25 LAB — MAGNESIUM: Magnesium: 2.1 mg/dL (ref 1.7–2.4)

## 2021-02-25 NOTE — Progress Notes (Signed)
NAME:  Virginia [name], MRN:  [id number], DOB:  [birth year], LOS: 7 ADMISSION DATE:  02/18/2021, CONSULTATION DATE:  02/18/21 REFERRING MD:  Dr. Lenard Lance, CHIEF COMPLAINT:  Shortness of breath   History of Present Illness:  70 year old female presented to [facility] ED via EMS on 02/18/2021 with acute on chronic combined hypoxic and hypercapnic respiratory failure.   Per son, Kareena Arrambide, patient lives at home alone and is assisted daily by himself and his wife as well as a home health nurse who comes out 3 times a week.  [name] reports it is he and his wife who help put her on her trilogy machine nightly.  The patient normally uses the trilogy as a CPAP mask with the tracheostomy capped overnight and is on 4.5 to 5 L nasal cannula during the day.   The patient's son reported that he and wife came down with COVID-19 last Wednesday, 02/14/2021, and have not been available to help the patient use this machine at night since 02/13/21, and so she has gone without it. Thresa Dozier stated that his mother and her home health nurse reported to him that she has had multiple falls. He stated that yesterday, on 02/17/21, she fell in the bathroom and was stranded on the floor from 3 am until the home helper arrived hours later. The son, daughter-in-law and home health were concerned about the patient's respiratory status, specifically possible CO2 retention, and EMS was called. The first 2 times the patient refused to go with EMS. The third time however the patient's SpO2 was reading in the 50's with a good waveform, she was placed on CPAP and brought to the ED for evaluation.   PCCM consulted to admit to ICU as patient is requiring mechanical ventilation support.  The patient's mentation has improved but PCO2 after 1 hour is 95.  Pertinent  Medical History  Atrial Fibrillation on Eliquis C. Difficile Severe COPD Chronic tracheostomy Anxiety & depression Hepatitis  C Arthritis HTN Fibromyalgia Anemia CAD Thoracic AAA Significant Hospital Events: Including procedures, antibiotic start and stop dates in addition to other pertinent events   02/18/21: Admit to ICU with acute on chronic combined hypoxic/hypercapnic respiratory failure with chronic tracheostomy requiring mechanical ventilation 8/29 severe copd exacerbation 8/30 did NOT tolerate PS mode, plan for PS mode today 8/31 failed weaning trials, CT ABD  8/30 CT ABD/PELVIS Possible transition point in the central pelvis suggesting partial or developing bowel obstruction over ileus. Distal small bowel and colon are relatively collapsed-casuing failure to wean from vent 9/1 less distended on vent, alert and awake 9/2 wanting to go home, no complaints, feels she is at baseline 9/3 unable to go home yesterday due to need to have vent connected to trach (patient has been using CPAP mask), episodes of triggering the vent on pressure support last night, she will need a rate nocturnally 9/4 slept well on mechanical ventilation nocturnally  Interim History / Subjective:  Awake and alert this morning looks rested, slept with rate last night General surgery signed off, no evidence of abdominal process End-stage COPD vent dependent COPD exacerbation due to Enterobacter aerogenes bronchitis and hypoxia/hypercarbia  She does not endorse any complaints this morning, continues to note that she feels more rested after sleeping with ventilator set to a rate.  Understands that she needs to have all ventilation done through the trach.  Objective   Blood pressure (!) 134/95, pulse 87, temperature 97.7 F (36.5 C), temperature source Oral, resp. rate 17, height 5' (1.524 m),  weight 52.4 kg, SpO2 97 %.    Vent Mode: (P) PRVC FiO2 (%):  [35 %-60 %] (P) 35 % Set Rate:  [12 bmp] (P) 12 bmp Vt Set:  [400 mL] (P) 400 mL PEEP:  [5 cmH20] (P) 5 cmH20   Intake/Output Summary (Last 24 hours) at 02/25/2021 0843 Last data  filed at 02/25/2021 0600 Gross per 24 hour  Intake 240 ml  Output 1500 ml  Net -1260 ml    Filed Weights   02/23/21 0349 02/24/21 0454 02/25/21 0500  Weight: 51.2 kg 52 kg 52.4 kg    ROS: A 10 point review of systems was performed and it is as noted above otherwise negative.  PHYSICAL EXAMINATION:  GENERAL: Chronically ill appearing, chronic use of accessories of respiration. EYES: Pupils equal, round, reactive to light.  No scleral icterus.  MOUTH: Oral mucosa moist, edentulous. NECK: Supple.  Tracheostomy in place, no drainage.  Vocalizing well with Passy-Muir valve. PULMONARY: Coarse, rhonchi or wheezes noted. CARDIOVASCULAR: S1 and S2.  No murmurs  GASTROINTESTINAL: Soft, nontender, non-distended. Positive bowel sounds.  MUSCULOSKELETAL: No swelling, clubbing, or edema.  NEUROLOGIC: Awake, alert, interactive. SKIN:intact,warm,dry   Assessment & Plan:  70 year old former smoker, with acute on chronic hypoxic/hypercapnic respiratory failure due to COPD exacerbation with Enterobacter aerogenes bronchitis.  She has chronic ventilatory dependence at home.  She was using Trilogy vent via mask rather than via trach.  She has done well here with ventilatory support via trach.  Acute on chronic respiratory failure with hypoxia and hypercarbia COPD exacerbation Enterobacter aerogenes bronchitis -continue steroids changed to p.o. x5 days then resume regular dose of 5 mg daily -continue bronchodilator therapy with DuoNeb as prescribed -No need for Trelegy Ellipta, should have all bronchodilators through neb via trach -Will need nocturnal ventilation with Trilogy via trach WITH SET RATE 12 AS BACKUP -Cefdinir 100 mg twice daily until 26 February 2021 will be a total of 7 days of antibiotics  No evidence of heart failure Paroxysmal SVT vs A-fib RVR s/p adenosine, known issue for patient 2D echo shows no evidence of heart failure Normal LVEF No recurrence of SVT/A. 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 DVT prophylaxis: LMWH GI prophylaxis: PPI Lines: N/A Foley:  N/A Code Status:  full code   Labs   CBC: Recent Labs  Lab 02/20/21 0355 02/21/21 0606 02/22/21 0555 02/23/21 0224 02/24/21 0520 02/25/21 0522  WBC 9.9 8.1 9.5 8.6 7.7 9.7  NEUTROABS 9.3*  --   --   --   --   --   HGB 7.6* 7.5* 7.9* 8.3* 8.2* 8.2*  HCT 24.3* 23.7* 25.9* 26.4* 26.4* 26.9*  MCV 100.8* 100.0 100.8* 97.8 99.6 98.2  PLT 155 157 167 165 197 225     Basic Metabolic Panel: Recent Labs  Lab 02/21/21 0606 02/22/21 0555 02/23/21 0224 02/24/21 0520 02/25/21 0522  NA 143 142 138 142 137  K 3.5 3.9 3.8 3.3* 4.7  CL 95* 97* 96* 99 96*  CO2 37* 36* 34* 38* 37*  GLUCOSE 112* 109* 118* 83 85  BUN 17 23 25* 21 22  CREATININE  0.73 0.80 0.74 0.72 0.77  CALCIUM 8.5* 8.8* 8.7* 9.0 8.9  MG 2.1 2.2 2.2 2.2 2.1  PHOS 2.9 4.3 3.8 3.5 3.4    GFR: Estimated Creatinine Clearance: 47 mL/min (by C-G formula based on SCr of 0.77 mg/dL). Recent Labs  Lab 02/18/21 1905 02/19/21 0600 02/20/21 0355 02/21/21 0606 02/22/21 0555 02/23/21 0224 02/24/21 0520 02/25/21 0522  PROCALCITON 0.98 1.16 0.66  --   --   --   --   --   WBC 12.8* 8.4 9.9   < > 9.5 8.6 7.7 9.7   < > = values in this interval not displayed.     Liver Function Tests: Recent Labs  Lab 02/18/21 1905 02/21/21 0606  AST 47*  --   ALT 26  --   ALKPHOS 22*  --   BILITOT 1.4*  --   PROT 6.7  --   ALBUMIN 3.9 2.7*    No results for  input(s): LIPASE, AMYLASE in the last 168 hours. No results for input(s): AMMONIA in the last 168 hours.  ABG    Component Value Date/Time   PHART 7.56 (H) 02/19/2021 0500   PCO2ART 46 02/19/2021 0500   PO2ART 88 02/19/2021 0500   HCO3 41.2 (H) 02/19/2021 0500   O2SAT 97.9 02/19/2021 0500      Coagulation Profile: Recent Labs  Lab 02/19/21 0600  INR 1.2     Cardiac Enzymes: Recent Labs  Lab 02/18/21 2254  CKTOTAL 127    Scheduled Meds:  apixaban  5 mg Oral BID   cefdinir  300 mg Oral Q12H   Chlorhexidine Gluconate Cloth  6 each Topical QHS   feeding supplement  237 mL Oral BID BM   ipratropium-albuterol  3 mL Nebulization Q6H   metoprolol tartrate  5 mg Intravenous Q6H   multivitamin with minerals  1 tablet Oral Daily   pantoprazole (PROTONIX) IV  40 mg Intravenous QHS   predniSONE  20 mg Oral Q breakfast   Continuous Infusions:  PRN Meds:.albuterol, diazepam, docusate sodium, HYDROcodone-acetaminophen, ipratropium-albuterol, polyethylene glycol  Disposition: Case management assisting with discharge planning.  Patient's DME company is [employer], they cannot have a equipment that the patient needs at home until Wednesday 7 September patient will need to remain in ICU until then. Home trilogy vent will need to be set to tidal volume of 400, respiratory rate of 12 as backup assist-control mode  Anticipate patient discharge on 9/7 on home vent.  Will need home vent at to be delivered through tracheostomy not through face mask.  Follow-up with Dr. Meredeth Ide primary pulmonologist  Level 3 follow-up  C. Danice Goltz, MD Advanced Bronchoscopy PCCM [facility]    *This note was dictated using voice recognition software/Dragon.  Despite best efforts to proofread, errors can occur which can change the meaning.  Any change was purely unintentional.

## 2021-02-25 NOTE — Progress Notes (Signed)
PHARMACY CONSULT NOTE  Pharmacy Consult for Electrolyte Monitoring and Replacement   Recent Labs: Potassium (mmol/L)  Date Value  02/25/2021 4.7   Magnesium (mg/dL)  Date Value  70/62/3762 2.1   Calcium (mg/dL)  Date Value  83/15/1761 8.9   Albumin (g/dL)  Date Value  60/73/7106 2.7 (L)   Phosphorus (mg/dL)  Date Value  26/94/8546 3.4   Sodium (mmol/L)  Date Value  02/25/2021 137     Assessment: 70 y.o. female with a past medical history of COPD on 3 L nasal cannula, anxiety, fibromyalgia, hypertension, atrial fibrillation, AAA presents to the emergency department for shortness of breath. Pharmacy is asked to follow and replace electrolytes while in the CCU.  Goal of Therapy:  Potassium 4.0 - 5.1 mmol/L Magnesium 2.0 - 2.4 mg/dL All Other Electrolytes WNL  Plan:  No replacement needed at this time.  Re-check electrolytes in AM  Ronnald Ramp ,PharmD,  Clinical Pharmacist 02/25/2021 8:37 AM

## 2021-02-25 NOTE — Progress Notes (Signed)
Physical Therapy Treatment Patient Details Name: Virginia [name] MRN: [id number] DOB: 06-03-1951 Today's Date: 02/25/2021    History of Present Illness Pt is a 70 y.o. 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 fib, fibromyalgia, HTN, osteoporosis, and Hep C brought to ED via EMS from home with hypoxia after a fall at home.    PT Comments    Pt supine in bed upon entry, agreeable for ambulation with therapy today. Pt required grossly supervision for bed mobility and CGA for transfers with RW. C/o mild dizziness with positional changes that was alleviated with time and cues for pursed lip breathing. Able to take a few steps at bedside before SpO2 desat to 74% and pt c/o severe dizziness. PT to quickly assist pt back to bed and titrate to 15L. Pt able to recover >90% in supine within ~36min with cues for pursed lip breathing. Pt believes episode was in part due to anxiety given PMH and recent events. HR ranging from 96-117bpm during session. Increased RR with mobility/anxiety. PT educated pt for visualization and preparation for tomorrows session; pt agreeable. Pt may benefit from coordination of PT with anxiety medication for next session. Pt left on bedpan per her request for BM. RN notified of vitals and session. Pt limited with meeting goals due to decreased activity tolerance, impaired cardiopulmonary tolerance to activity, decreased balance, and functional weakness. Pt will continue to benefit from skilled acute PT services to address deficits for return to baseline function. Will continue to recommend SNF at DC.  Of note, half of a brown-ish pill was found on pt's floor. RN notified.    Follow Up Recommendations  SNF     Equipment Recommendations  None recommended by PT       Precautions / Restrictions Precautions Precautions: Fall Precaution Comments: trach Restrictions Weight Bearing Restrictions: No    Mobility  Bed Mobility Overal bed mobility: Needs  Assistance Bed Mobility: Supine to Sit;Sit to Supine;Rolling Rolling: Modified independent (Device/Increase time)   Supine to sit: Supervision;HOB elevated Sit to supine: Total assist   General bed mobility comments: Mod I to roll to the left for placement of bedpan. Supervision for safety for supine>sit transfer, HOB elevated, increased time/effort, use of BUE for support. Required quick transition from sit>supine due to vitals/dizziness, requiring total assist. Mild c/o dizziness with supine>sit transfer that was alleviated with cues for pursed lip breathing.    Transfers Overall transfer level: Needs assistance Equipment used: Rolling walker (2 wheeled) Transfers: Sit to/from Stand Sit to Stand: Min guard         General transfer comment: CGA for safety to perform STS from EOB with RW; verbal cues for hand placement and sequencing. Mild c/o dizziness with sit>stand that was alleviated with cues for pursed lip breathing.  Ambulation/Gait Ambulation/Gait assistance: Min guard Gait Distance (Feet): 2 Feet Assistive device: Rolling walker (2 wheeled)       General Gait Details: CGA for safety to take a few steps at bedside, before requiring quick return to bed due to vitals. After taking a few steps, pt with change in demeanor with c/o severe dizziness and fear of falling. SpO2 dropped to 74% on 10L via trach collar.     Balance Overall balance assessment: Needs assistance Sitting-balance support: Feet unsupported;Bilateral upper extremity supported Sitting balance-Leahy Scale: Fair     Standing balance support: Bilateral upper extremity supported;During functional activity Standing balance-Leahy Scale: Fair Standing balance comment: in RW  Cognition Arousal/Alertness: Awake/alert Behavior During Therapy: WFL for tasks assessed/performed Overall Cognitive Status: Within Functional Limits for tasks assessed         Exercises Other Exercises Other Exercises:  Able to perfom bed mobility, transfers, and minimal gait with RW and supervision-CGA. Further mobility limited secondary to vitals and c/o dizziness. Left on bedpan per request of pt for BM. RN notified. Other Exercises: Pt educated regarding: PT role/POC, pursed lip breathing, safety with mobility, visualization and preparation for PT session tomorrow.    General Comments General comments (skin integrity, edema, etc.): SpO2 dropped to 74% on 10L via trach collar. Required titration to 15L with return to supine. Able to recover in bed within ~56min with cues for pursed lip breathing. Returned to 10L at end of session with SpO2 >90%. RN notified of vtials.      Pertinent Vitals/Pain Pain Assessment: 0-10 Pain Score: 7  Pain Location: back/stomach Pain Intervention(s): Monitored during session;Repositioned;Patient requesting pain meds-RN notified     PT Goals (current goals can now be found in the care plan section) Acute Rehab PT Goals Patient Stated Goal: to get her strength back and go home PT Goal Formulation: With patient Time For Goal Achievement: 03/09/21 Potential to Achieve Goals: Good Progress towards PT goals: Progressing toward goals    Frequency    Min 2X/week      PT Plan Current plan remains appropriate       AM-PAC PT "6 Clicks" Mobility   Outcome Measure  Help needed turning from your back to your side while in a flat bed without using bedrails?: A Little Help needed moving from lying on your back to sitting on the side of a flat bed without using bedrails?: A Little Help needed moving to and from a bed to a chair (including a wheelchair)?: A Little Help needed standing up from a chair using your arms (e.g., wheelchair or bedside chair)?: A Little Help needed to walk in hospital room?: A Lot Help needed climbing 3-5 steps with a railing? : A Lot 6 Click Score: 16    End of Session Equipment Utilized During Treatment: Gait belt;Oxygen (10L via trach  collar) Activity Tolerance: Patient limited by fatigue;Treatment limited secondary to medical complications (Comment) Patient left: in bed;with call bell/phone within reach;with bed alarm set (on bedpan) Nurse Communication: Mobility status (vitals, bedpan) PT Visit Diagnosis: Other abnormalities of gait and mobility (R26.89);Difficulty in walking, not elsewhere classified (R26.2);Muscle weakness (generalized) (M62.81)     Time: 6295-2841 PT Time Calculation (min) (ACUTE ONLY): 29 min  Charges:  $Therapeutic Activity: 23-37 mins                      Vira Blanco, PT, DPT 2:26 PM,02/25/21

## 2021-02-26 ENCOUNTER — Inpatient Hospital Stay: Payer: Medicare Other

## 2021-02-26 LAB — CBC WITH DIFFERENTIAL/PLATELET
Abs Immature Granulocytes: 0.41 10*3/uL — ABNORMAL HIGH (ref 0.00–0.07)
Basophils Absolute: 0 10*3/uL (ref 0.0–0.1)
Basophils Relative: 0 %
Eosinophils Absolute: 0.3 10*3/uL (ref 0.0–0.5)
Eosinophils Relative: 3 %
HCT: 25.6 % — ABNORMAL LOW (ref 36.0–46.0)
Hemoglobin: 7.9 g/dL — ABNORMAL LOW (ref 12.0–15.0)
Immature Granulocytes: 4 %
Lymphocytes Relative: 13 %
Lymphs Abs: 1.4 10*3/uL (ref 0.7–4.0)
MCH: 30.2 pg (ref 26.0–34.0)
MCHC: 30.9 g/dL (ref 30.0–36.0)
MCV: 97.7 fL (ref 80.0–100.0)
Monocytes Absolute: 1.2 10*3/uL — ABNORMAL HIGH (ref 0.1–1.0)
Monocytes Relative: 11 %
Neutro Abs: 7.9 10*3/uL — ABNORMAL HIGH (ref 1.7–7.7)
Neutrophils Relative %: 69 %
Platelets: 261 10*3/uL (ref 150–400)
RBC: 2.62 MIL/uL — ABNORMAL LOW (ref 3.87–5.11)
RDW: 15.7 % — ABNORMAL HIGH (ref 11.5–15.5)
WBC: 11.3 10*3/uL — ABNORMAL HIGH (ref 4.0–10.5)
nRBC: 0.4 % — ABNORMAL HIGH (ref 0.0–0.2)

## 2021-02-26 LAB — BASIC METABOLIC PANEL
Anion gap: 8 (ref 5–15)
BUN: 22 mg/dL (ref 8–23)
CO2: 36 mmol/L — ABNORMAL HIGH (ref 22–32)
Calcium: 8.9 mg/dL (ref 8.9–10.3)
Chloride: 93 mmol/L — ABNORMAL LOW (ref 98–111)
Creatinine, Ser: 0.61 mg/dL (ref 0.44–1.00)
GFR, Estimated: >60 mL/min (ref >60)
Glucose, Bld: 87 mg/dL (ref 70–99)
Potassium: 4.5 mmol/L (ref 3.5–5.1)
Sodium: 137 mmol/L (ref 135–145)

## 2021-02-26 LAB — MAGNESIUM: Magnesium: 2 mg/dL (ref 1.7–2.4)

## 2021-02-26 LAB — PHOSPHORUS: Phosphorus: 3.8 mg/dL (ref 2.5–4.6)

## 2021-02-26 MED ORDER — ALPRAZOLAM 0.5 MG PO TABS
0.5000 mg | ORAL_TABLET | Freq: Three times a day (TID) | ORAL | Status: DC
  Administered 2021-02-26 – 2021-03-12 (×42): 0.5 mg via ORAL
  Filled 2021-02-26 (×42): qty 1

## 2021-02-26 MED ORDER — ALPRAZOLAM 0.5 MG PO TABS
0.5000 mg | ORAL_TABLET | Freq: Three times a day (TID) | ORAL | Status: AC
  Administered 2021-02-26 (×2): 0.5 mg via ORAL
  Filled 2021-02-26 (×2): qty 1

## 2021-02-26 MED ORDER — IOHEXOL 350 MG/ML SOLN
75.0000 mL | Freq: Once | INTRAVENOUS | Status: AC | PRN
  Administered 2021-02-26: 75 mL via INTRAVENOUS

## 2021-02-26 NOTE — Progress Notes (Signed)
PHARMACY CONSULT NOTE  Pharmacy Consult for Electrolyte Monitoring and Replacement   Recent Labs: Potassium (mmol/L)  Date Value  02/26/2021 4.5   Magnesium (mg/dL)  Date Value  24/81/8590 2.0   Calcium (mg/dL)  Date Value  93/04/2161 8.9   Albumin (g/dL)  Date Value  44/69/5072 2.7 (L)   Phosphorus (mg/dL)  Date Value  25/75/0518 3.8   Sodium (mmol/L)  Date Value  02/26/2021 137     Assessment: 70 y.o. female with a past medical history of COPD on 3 L nasal cannula, anxiety, fibromyalgia, hypertension, atrial fibrillation, AAA presents to the emergency department for shortness of breath. Pharmacy is asked to follow and replace electrolytes while in the CCU.  Goal of Therapy:  Potassium 4.0 - 5.1 mmol/L Magnesium 2.0 - 2.4 mg/dL All Other Electrolytes WNL  Plan:  K 4.5 Mag 2.0  Phos 3.8  Scr 0.61  No replacement needed at this time.  Re-check electrolytes in AM  Anahita Cua A ,PharmD,  Clinical Pharmacist 02/26/2021 2:50 PM

## 2021-02-26 NOTE — Progress Notes (Signed)
NAME:  Virginia [name], MRN:  [id number], DOB:  10-15-1950, LOS: 8 ADMISSION DATE:  02/18/2021, CONSULTATION DATE:  02/18/21 REFERRING MD:  Dr. Lenard Lance, CHIEF COMPLAINT:  Shortness of breath   History of Present Illness:  70 year old female presented to [facility] ED via EMS on 02/18/2021 with acute on chronic combined hypoxic and hypercapnic respiratory failure.   Per son, Virginia [name], patient lives at home alone and is assisted daily by himself and his wife as well as a home health nurse who comes out 3 times a week.  Virginia [name] reports it is he and his wife who help put her on her trilogy machine nightly.  The patient normally uses the trilogy as a CPAP mask with the tracheostomy capped overnight and is on 4.5 to 5 L nasal cannula during the day.   The patient's son reported that he and wife came down with COVID-19 last Wednesday, 02/14/2021, and have not been available to help the patient use this machine at night since 02/13/21, and so she has gone without it. Virginia [name] stated that his mother and her home health nurse reported to him that she has had multiple falls. He stated that yesterday, on 02/17/21, she fell in the bathroom and was stranded on the floor from 3 am until the home helper arrived hours later. The son, daughter-in-law and home health were concerned about the patient's respiratory status, specifically possible CO2 retention, and EMS was called. The first 2 times the patient refused to go with EMS. The third time however the patient's SpO2 was reading in the 50's with a good waveform, she was placed on CPAP and brought to the ED for evaluation.   PCCM consulted to admit to ICU as patient is requiring mechanical ventilation support.  The patient's mentation has improved but PCO2 after 1 hour is 95.  Pertinent  Medical History  Atrial Fibrillation on Eliquis C. Difficile Severe COPD Chronic tracheostomy Anxiety & depression Hepatitis  C Arthritis HTN Fibromyalgia Anemia CAD Thoracic AAA Significant Hospital Events: Including procedures, antibiotic start and stop dates in addition to other pertinent events   02/18/21: Admit to ICU with acute on chronic combined hypoxic/hypercapnic respiratory failure with chronic tracheostomy requiring mechanical ventilation 8/29 severe copd exacerbation 8/30 did NOT tolerate PS mode, plan for PS mode today 8/31 failed weaning trials, CT ABD  8/30 CT ABD/PELVIS Possible transition point in the central pelvis suggesting partial or developing bowel obstruction over ileus. Distal small bowel and colon are relatively collapsed-casuing failure to wean from vent 9/1 less distended on vent, alert and awake 9/2 wanting to go home, no complaints, feels she is at baseline 9/3 unable to go home yesterday due to need to have vent connected to trach (patient has been using CPAP mask), episodes of triggering the vent on pressure support last night, she [name] need a rate nocturnally 9/4 slept well on mechanical ventilation nocturnally 02/26/21- patient is lucid , sheson HF trache 14L/min.  She is tachycardic and reports worse chest discomfort,concern for PE [name] have ct angio today, she requests xanax for labored breathing and anxiety  Interim History / Subjective:  Awake and alert this morning looks rested, slept with rate last night General surgery signed off, no evidence of abdominal process End-stage COPD vent dependent COPD exacerbation due to Enterobacter aerogenes bronchitis and hypoxia/hypercarbia  She does not endorse any complaints this morning, continues to note that she feels more rested after sleeping with ventilator set to a rate.  Understands that she needs  to have all ventilation done through the trach.     Objective   Blood pressure 134/89, pulse 96, temperature 98.2 F (36.8 C), temperature source Oral, resp. rate 16, height 5' (1.524 m), weight 53.1 kg, SpO2 100 %.    Vent Mode:  PRVC FiO2 (%):  [35 %-60 %] 40 % Set Rate:  [12 bmp-16 bmp] 16 bmp Vt Set:  [400 mL] 400 mL PEEP:  [5 cmH20] 5 cmH20 Plateau Pressure:  [18 cmH20] 18 cmH20   Intake/Output Summary (Last 24 hours) at 02/26/2021 0750 Last data filed at 02/26/2021 0600 Gross per 24 hour  Intake 640 ml  Output 1525 ml  Net -885 ml    Filed Weights   02/24/21 0454 02/25/21 0500 02/26/21 0116  Weight: 52 kg 52.4 kg 53.1 kg    ROS: A 10 point review of systems was performed and it is as noted above otherwise negative.  PHYSICAL EXAMINATION:  GENERAL: Chronically ill appearing, chronic use of accessories of respiration. EYES: Pupils equal, round, reactive to light.  No scleral icterus.  MOUTH: Oral mucosa moist, edentulous. NECK: Supple.  Tracheostomy in place, no drainage.  Vocalizing well with Passy-Muir valve. PULMONARY: Coarse, rhonchi or wheezes noted. CARDIOVASCULAR: S1 and S2.  No murmurs  GASTROINTESTINAL: Soft, nontender, non-distended. Positive bowel sounds.  MUSCULOSKELETAL: No swelling, clubbing, or edema.  NEUROLOGIC: Awake, alert, interactive. SKIN:intact,warm,dry   Assessment & Plan:  70 year old former smoker, with acute on chronic hypoxic/hypercapnic respiratory failure due to COPD exacerbation with Enterobacter aerogenes bronchitis.  She has chronic ventilatory dependence at home.  She was using Trilogy vent via mask rather than via trach.  She has done well here with ventilatory support via trach.  Acute on chronic respiratory failure with hypoxia and hypercarbia COPD exacerbation Enterobacter aerogenes bronchitis -continue steroids changed to p.o. x5 days then resume regular dose of 5 mg daily -continue bronchodilator therapy with DuoNeb as prescribed -No need for Trelegy Ellipta, should have all bronchodilators through neb via trach -[name] need nocturnal ventilation with Trilogy via trach WITH SET RATE 12 AS BACKUP -Cefdinir 100 mg twice daily until 26 February 2021 [name] be a  total of 7 days of antibiotics  -ct angio today   No evidence of heart failure Paroxysmal SVT vs A-fib RVR s/p adenosine, known issue for patient 2D echo shows no evidence of heart failure Normal LVEF No recurrence of SVT/A. 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 DVT prophylaxis: LMWH GI prophylaxis: PPI Lines: N/A Foley:  N/A Code Status:  full code   Labs   CBC: Recent Labs  Lab 02/20/21 0355 02/21/21 0606 02/22/21 0555 02/23/21 0224 02/24/21 0520 02/25/21 0522 02/26/21 0419  WBC 9.9   < > 9.5 8.6 7.7 9.7 11.3*  NEUTROABS 9.3*  --   --   --   --   --  7.9*  HGB 7.6*   < > 7.9* 8.3* 8.2* 8.2* 7.9*  HCT 24.3*   < > 25.9* 26.4* 26.4* 26.9* 25.6*  MCV 100.8*   < > 100.8* 97.8 99.6 98.2 97.7  PLT 155   < > 167 165 197  225 261   < > = values in this interval not displayed.     Basic Metabolic Panel: Recent Labs  Lab 02/22/21 0555 02/23/21 0224 02/24/21 0520 02/25/21 0522 02/26/21 0419  NA 142 138 142 137 137  K 3.9 3.8 3.3* 4.7 4.5  CL 97* 96* 99 96* 93*  CO2 36* 34* 38* 37* 36*  GLUCOSE 109* 118* 83 85 87  BUN 23 25* 21 22 22   CREATININE 0.80 0.74 0.72 0.77 0.61  CALCIUM 8.8* 8.7* 9.0 8.9 8.9  MG 2.2 2.2 2.2 2.1 2.0  PHOS 4.3 3.8 3.5 3.4 3.8    GFR: Estimated Creatinine Clearance: 47 mL/min (by C-G formula based on SCr of 0.61 mg/dL). Recent Labs  Lab 02/20/21 0355 02/21/21 0606 02/23/21 0224 02/24/21 0520 02/25/21 0522 02/26/21 0419  PROCALCITON 0.66  --    --   --   --   --   WBC 9.9   < > 8.6 7.7 9.7 11.3*   < > = values in this interval not displayed.     Liver Function Tests: Recent Labs  Lab 02/21/21 0606  ALBUMIN 2.7*    No results for input(s): LIPASE, AMYLASE in the last 168 hours. No results for input(s): AMMONIA in the last 168 hours.  ABG    Component Value Date/Time   PHART 7.56 (H) 02/19/2021 0500   PCO2ART 46 02/19/2021 0500   PO2ART 88 02/19/2021 0500   HCO3 41.2 (H) 02/19/2021 0500   O2SAT 97.9 02/19/2021 0500      Coagulation Profile: No results for input(s): INR, PROTIME in the last 168 hours.   Cardiac Enzymes: No results for input(s): CKTOTAL, CKMB, CKMBINDEX, TROPONINI in the last 168 hours.  Scheduled Meds:  apixaban  5 mg Oral BID   Chlorhexidine Gluconate Cloth  6 each Topical QHS   feeding supplement  237 mL Oral BID BM   ipratropium-albuterol  3 mL Nebulization Q6H   metoprolol tartrate  5 mg Intravenous Q6H   multivitamin with minerals  1 tablet Oral Daily   pantoprazole (PROTONIX) IV  40 mg Intravenous QHS   predniSONE  20 mg Oral Q breakfast   Continuous Infusions:  PRN Meds:.albuterol, diazepam, docusate sodium, HYDROcodone-acetaminophen, ipratropium-albuterol, polyethylene glycol  Disposition: Case management assisting with discharge planning.  Patient's DME company is [employer], they cannot have a equipment that the patient needs at home until Wednesday 7 September patient [name] need to remain in ICU until then. Home trilogy vent [name] need to be set to tidal volume of 400, respiratory rate of 12 as backup assist-control mode  Anticipate patient discharge on 9/7 on home vent.  [name] need home vent at to be delivered through tracheostomy not through face mask.  Follow-up with Dr. 11/7 primary pulmonologist  Critical care provider statement:   Total critical care time: 33 minutes   Performed by: Meredeth Ide MD   Critical care time was exclusive of separately billable procedures and  treating other patients.   Critical care was necessary to treat or prevent imminent or life-threatening deterioration.   Critical care was time spent personally by me on the following activities: development of treatment plan with patient and/or surrogate as well as nursing, discussions with consultants, evaluation of patient's response to treatment, examination of patient, obtaining history from patient or surrogate, ordering and performing treatments and interventions, ordering and review of laboratory studies, ordering and review of radiographic studies, pulse oximetry and re-evaluation of patient's condition.    Karna Christmas, M.D.  Pulmonary &  Critical Care Medicine

## 2021-02-27 ENCOUNTER — Other Ambulatory Visit: Payer: Self-pay

## 2021-02-27 LAB — CBC WITH DIFFERENTIAL/PLATELET
Abs Immature Granulocytes: 0.31 10*3/uL — ABNORMAL HIGH (ref 0.00–0.07)
Basophils Absolute: 0 10*3/uL (ref 0.0–0.1)
Basophils Relative: 0 %
Eosinophils Absolute: 0.3 10*3/uL (ref 0.0–0.5)
Eosinophils Relative: 3 %
HCT: 26.1 % — ABNORMAL LOW (ref 36.0–46.0)
Hemoglobin: 8.1 g/dL — ABNORMAL LOW (ref 12.0–15.0)
Immature Granulocytes: 3 %
Lymphocytes Relative: 15 %
Lymphs Abs: 1.5 10*3/uL (ref 0.7–4.0)
MCH: 30.3 pg (ref 26.0–34.0)
MCHC: 31 g/dL (ref 30.0–36.0)
MCV: 97.8 fL (ref 80.0–100.0)
Monocytes Absolute: 1 10*3/uL (ref 0.1–1.0)
Monocytes Relative: 11 %
Neutro Abs: 6.5 10*3/uL (ref 1.7–7.7)
Neutrophils Relative %: 68 %
Platelets: 285 10*3/uL (ref 150–400)
RBC: 2.67 MIL/uL — ABNORMAL LOW (ref 3.87–5.11)
RDW: 15.8 % — ABNORMAL HIGH (ref 11.5–15.5)
WBC: 9.7 10*3/uL (ref 4.0–10.5)
nRBC: 0 % (ref 0.0–0.2)

## 2021-02-27 LAB — BASIC METABOLIC PANEL
Anion gap: 5 (ref 5–15)
BUN: 17 mg/dL (ref 8–23)
CO2: 38 mmol/L — ABNORMAL HIGH (ref 22–32)
Calcium: 8.7 mg/dL — ABNORMAL LOW (ref 8.9–10.3)
Chloride: 93 mmol/L — ABNORMAL LOW (ref 98–111)
Creatinine, Ser: 0.57 mg/dL (ref 0.44–1.00)
GFR, Estimated: >60 mL/min (ref >60)
Glucose, Bld: 88 mg/dL (ref 70–99)
Potassium: 4.3 mmol/L (ref 3.5–5.1)
Sodium: 136 mmol/L (ref 135–145)

## 2021-02-27 LAB — MAGNESIUM: Magnesium: 2.1 mg/dL (ref 1.7–2.4)

## 2021-02-27 LAB — PHOSPHORUS: Phosphorus: 4.5 mg/dL (ref 2.5–4.6)

## 2021-02-27 LAB — GLUCOSE, CAPILLARY: Glucose-Capillary: 86 mg/dL (ref 70–99)

## 2021-02-27 MED ORDER — IPRATROPIUM-ALBUTEROL 0.5-2.5 (3) MG/3ML IN SOLN
3.0000 mL | Freq: Three times a day (TID) | RESPIRATORY_TRACT | Status: DC
  Administered 2021-02-27 – 2021-03-12 (×40): 3 mL via RESPIRATORY_TRACT
  Filled 2021-02-27 (×42): qty 3

## 2021-02-27 NOTE — Evaluation (Signed)
Occupational Therapy Evaluation Patient Details Name: Virginia [name] MRN: [id number] DOB: 01/20/1951 Today's Date: 02/27/2021    History of Present Illness Pt is a 70 y.o. 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 fib, fibromyalgia, HTN, osteoporosis, and Hep C brought to ED via EMS from home with hypoxia after a fall at home.   Clinical Impression   Patient presenting with decreased I in self care, balance, functional mobility/transfer, endurance, and safety awareness. Patient reports living at home alone in an apartment but with intermittent assist from daughter and an aide for self care and home management tasks PTA. Pt reports ambulating with RW for short distances. Pt was able to come to EOB and stand with min A and take several side steps with use of RW. Pt fatigues very quickly. She was on 10L O2 and O2 saturation dropping to 75%. Pt returned to bed, PMSV removed, and RN called to suction per pt request. Pt was able to tolerate sitting EOB for ~ 10 minutes for self care tasks. Pt likely to fluctuate depending on level of fatigue and could likely need increased assistance during the day. OT recommends SNF to address functional deficits. If pt were to return home she would need 24/7 assist for safety. Patient will benefit from acute OT to increase overall independence in the areas of ADLs, functional mobility, and safety awareness in order to safely discharge to next venue of care.     Follow Up Recommendations  SNF (If pt returns home she will need 24/7 supervision/assist for safety)   Equipment Recommendations  None recommended by OT       Precautions / Restrictions Precautions Precautions: Fall Precaution Comments: trach      Mobility Bed Mobility Overal bed mobility: Needs Assistance Bed Mobility: Supine to Sit;Sit to Supine;Rolling     Supine to sit: Supervision;HOB elevated Sit to supine: Min assist   General bed mobility comments: Min A for B LEs to  return to bed    Transfers Overall transfer level: Needs assistance Equipment used: Rolling walker (2 wheeled) Transfers: Sit to/from [employer] Sit to Stand: Min guard Stand pivot transfers: Min assist            Balance Overall balance assessment: Needs assistance Sitting-balance support: Feet unsupported;Bilateral upper extremity supported Sitting balance-Leahy Scale: Fair Sitting balance - Comments: pt able to sit with fair balance, but preferred bilateral UE support   Standing balance support: Bilateral upper extremity supported;During functional activity Standing balance-Leahy Scale: Fair Standing balance comment: in RW                           ADL either performed or assessed with clinical judgement   ADL Overall ADL's : Needs assistance/impaired     Grooming: Wash/dry hands;Wash/dry face;Brushing hair;Minimal assistance;Sitting                                       Vision Patient Visual Report: No change from baseline              Pertinent Vitals/Pain Pain Assessment: No/denies pain     Hand Dominance Right   Extremity/Trunk Assessment Upper Extremity Assessment Upper Extremity Assessment: Generalized weakness   Lower Extremity Assessment Lower Extremity Assessment: Generalized weakness   Cervical / Trunk Assessment Cervical / Trunk Assessment: Kyphotic   Communication Communication Communication:  Passy-Muir valve   Cognition Arousal/Alertness: Awake/alert Behavior During Therapy: WFL for tasks assessed/performed Overall Cognitive Status: Within Functional Limits for tasks assessed                                                Home Living Family/patient expects to be discharged to:: Private residence Living Arrangements: Alone Available Help at Discharge: Family;Personal care attendant;Available PRN/intermittently Type of Home: Apartment Home Access: Level entry     Home  Layout: One level     Bathroom Shower/Tub: Tub/shower unit         Home Equipment: Emergency planning/management officer - 4 wheels;Grab bars - toilet   Additional Comments: reported 2 falls in the last 3 months, stated her legs just give out on her      Prior Functioning/Environment Level of Independence: Needs assistance  Gait / Transfers Assistance Needed: modI with rollator ADL's / Homemaking Assistance Needed: lives alone but family assists with picking up groceries, putting them away, cooking as needed. Has an aide that comes M- F for several hours to help with home management and ADLs   Comments: 5L at rest, 7-8L with ambulation (8L on a bad day per pt report)        OT Problem List: Decreased strength;Decreased activity tolerance;Impaired balance (sitting and/or standing);Decreased safety awareness;Decreased cognition;Cardiopulmonary status limiting activity      OT Treatment/Interventions: Self-care/ADL training;Therapeutic exercise;Therapeutic activities;Energy conservation;Patient/family education;DME and/or AE instruction;Balance training    OT Goals(Current goals can be found in the care plan section) Acute Rehab OT Goals Patient Stated Goal: to get her strength back and go home OT Goal Formulation: With patient Time For Goal Achievement: 03/13/21 Potential to Achieve Goals: Good ADL Goals Pt Will Perform Grooming: with modified independence;standing Pt Will Perform Lower Body Dressing: with min guard assist;sit to/from stand Pt Will Transfer to Toilet: with min guard assist;bedside commode Pt Will Perform Toileting - Clothing Manipulation and hygiene: with min guard assist;sit to/from stand  OT Frequency: Min 2X/week   Barriers to D/C:    pt does live alone          AM-PAC OT "6 Clicks" Daily Activity     Outcome Measure Help from another person eating meals?: None Help from another person taking care of personal grooming?: None Help from another person toileting, which  includes using toliet, bedpan, or urinal?: A Lot Help from another person bathing (including washing, rinsing, drying)?: A Lot Help from another person to put on and taking off regular upper body clothing?: A Little Help from another person to put on and taking off regular lower body clothing?: A Lot 6 Click Score: 17   End of Session Nurse Communication: Mobility status;Other (comment) (need for suction)  Activity Tolerance: Patient tolerated treatment well Patient left: in bed;with call bell/phone within reach;with bed alarm set  OT Visit Diagnosis: Unsteadiness on feet (R26.81);Repeated falls (R29.6);Muscle weakness (generalized) (M62.81);History of falling (Z91.81)                Time: 1340-1409 OT Time Calculation (min): 29 min Charges:  OT General Charges $OT Visit: 1 Visit OT Evaluation $OT Eval Moderate Complexity: 1 Mod OT Treatments $Therapeutic Activity: 23-37 mins  Jackquline Denmark, MS, OTR/L , CBIS ascom [phone]  02/27/21, 4:16 PM

## 2021-02-27 NOTE — Telephone Encounter (Signed)
Created in error

## 2021-02-27 NOTE — Progress Notes (Signed)
PHARMACY CONSULT NOTE  Pharmacy Consult for Electrolyte Monitoring and Replacement   Recent Labs: Potassium (mmol/L)  Date Value  02/27/2021 4.3   Magnesium (mg/dL)  Date Value  69/67/8938 2.1   Calcium (mg/dL)  Date Value  04/09/5101 8.7 (L)   Albumin (g/dL)  Date Value  58/52/7782 2.7 (L)   Phosphorus (mg/dL)  Date Value  42/35/3614 4.5   Sodium (mmol/L)  Date Value  02/27/2021 136     Assessment: 70 y.o. female with a past medical history of COPD on 3 L nasal cannula, anxiety, fibromyalgia, hypertension, atrial fibrillation, AAA presents to the emergency department for shortness of breath. Pharmacy is asked to follow and replace electrolytes while in the CCU.  Goal of Therapy:  Potassium 4.0 - 5.1 mmol/L Magnesium 2.0 - 2.4 mg/dL All Other Electrolytes WNL  Plan:  --No replacement indicated --Continue to follow along as indicated  Pricilla Riffle, PharmD, BCPS Clinical Pharmacist 02/27/2021 12:00 PM

## 2021-02-27 NOTE — TOC Progression Note (Addendum)
Transition of Care [facility]) - Progression Note    Patient Details  Name: MORNINGSTAR TOFT MRN: [id number] Date of Birth: Jul 22, 1950  Transition of Care [facility]) CM/SW Contact  Hetty Ely, RN Phone Number: 02/27/2021, 12:08 PM  Clinical Narrative:  Jasmine Awe [phone] to check on the status of the Trilogy equipment. Amber says it did not arrive and she will call when it comes. 1:28pm- Received a call from [employer], the Trilogy equipment education can not be done until Friday, 03/02/21 due to family being on Covid quarantine. There is no one else that can assist patient at home.     Expected Discharge Plan: Home w Home Health Services Barriers to Discharge: Continued Medical Work up  Expected Discharge Plan and Services Expected Discharge Plan: Home w Home Health Services In-house Referral: Clinical Social Work   Post Acute Care Choice: Durable Medical Equipment Living arrangements for the past 2 months: Apartment Expected Discharge Date: 02/23/21                                     Social Determinants of Health (SDOH) Interventions    Readmission Risk Interventions No flowsheet data found.

## 2021-02-27 NOTE — Progress Notes (Signed)
NAME:  Virginia [name], MRN:  [id number], DOB:  04-25-1951, LOS: 9 ADMISSION DATE:  02/18/2021, CONSULTATION DATE:  02/18/21 REFERRING MD:  Dr. Lenard Lance, CHIEF COMPLAINT:  Shortness of breath   History of Present Illness:  70 year old female presented to [facility] ED via EMS on 02/18/2021 with acute on chronic combined hypoxic and hypercapnic respiratory failure.   Per son, Virginia [name], patient lives at home alone and is assisted daily by himself and his wife as well as a home health nurse who comes out 3 times a week.  [name] reports it is he and his wife who help put her on her trilogy machine nightly.  The patient normally uses the trilogy as a CPAP mask with the tracheostomy capped overnight and is on 4.5 to 5 L nasal cannula during the day.   The patient's son reported that he and wife came down with COVID-19 last Wednesday, 02/14/2021, and have not been available to help the patient use this machine at night since 02/13/21, and so she has gone without it. Virginia [name] stated that his mother and her home health nurse reported to him that she has had multiple falls. He stated that yesterday, on 02/17/21, she fell in the bathroom and was stranded on the floor from 3 am until the home helper arrived hours later. The son, daughter-in-law and home health were concerned about the patient's respiratory status, specifically possible CO2 retention, and EMS was called. The first 2 times the patient refused to go with EMS. The third time however the patient's SpO2 was reading in the 50's with a good waveform, she was placed on CPAP and brought to the ED for evaluation.   PCCM consulted to admit to ICU as patient is requiring mechanical ventilation support.  The patient's mentation has improved but PCO2 after 1 hour is 95.  02/27/21- patient with less anxiety today.  She is smiling during my examination. She had CM consultation today and supplies should be delivered by this Friday with plan to dc home post NIV  optimization.  Trache site without exudation , vitals improving.  Pertinent  Medical History  Atrial Fibrillation on Eliquis C. Difficile Severe COPD Chronic tracheostomy Anxiety & depression Hepatitis C Arthritis HTN Fibromyalgia Anemia CAD Thoracic AAA Significant Hospital Events: Including procedures, antibiotic start and stop dates in addition to other pertinent events   02/18/21: Admit to ICU with acute on chronic combined hypoxic/hypercapnic respiratory failure with chronic tracheostomy requiring mechanical ventilation 8/29 severe copd exacerbation 8/30 did NOT tolerate PS mode, plan for PS mode today 8/31 failed weaning trials, CT ABD  8/30 CT ABD/PELVIS Possible transition point in the central pelvis suggesting partial or developing bowel obstruction over ileus. Distal small bowel and colon are relatively collapsed-casuing failure to wean from vent 9/1 less distended on vent, alert and awake 9/2 wanting to go home, no complaints, feels she is at baseline 9/3 unable to go home yesterday due to need to have vent connected to trach (patient has been using CPAP mask), episodes of triggering the vent on pressure support last night, she will need a rate nocturnally 9/4 slept well on mechanical ventilation nocturnally 02/26/21- patient is lucid , sheson HF trache 14L/min.  She is tachycardic and reports worse chest discomfort,concern for PE will have ct angio today, she requests xanax for labored breathing and anxiety  Interim History / Subjective:  Awake and alert this morning looks rested, slept with rate last night General surgery signed off, no evidence of abdominal process  End-stage COPD vent dependent COPD exacerbation due to Enterobacter aerogenes bronchitis and hypoxia/hypercarbia  She does not endorse any complaints this morning, continues to note that she feels more rested after sleeping with ventilator set to a rate.  Understands that she needs to have all ventilation done  through the trach.     Objective   Blood pressure 119/86, pulse 84, temperature 98.7 F (37.1 C), resp. rate 17, height 5' (1.524 m), weight 53.1 kg, SpO2 99 %.    Vent Mode: PRVC FiO2 (%):  [30 %-50 %] 30 % Set Rate:  [16 bmp] 16 bmp Vt Set:  [400 mL] 400 mL PEEP:  [5 cmH20] 5 cmH20 Plateau Pressure:  [28 cmH20] 28 cmH20   Intake/Output Summary (Last 24 hours) at 02/27/2021 0801 Last data filed at 02/27/2021 0500 Gross per 24 hour  Intake 520 ml  Output 1550 ml  Net -1030 ml    Filed Weights   02/25/21 0500 02/26/21 0116 02/27/21 0500  Weight: 52.4 kg 53.1 kg 53.1 kg    ROS: A 10 point review of systems was performed and it is as noted above otherwise negative.  PHYSICAL EXAMINATION:  GENERAL: Chronically ill appearing, chronic use of accessories of respiration. EYES: Pupils equal, round, reactive to light.  No scleral icterus.  MOUTH: Oral mucosa moist, edentulous. NECK: Supple.  Tracheostomy in place, no drainage.  Vocalizing well with Passy-Muir valve. PULMONARY: Coarse, rhonchi or wheezes noted. CARDIOVASCULAR: S1 and S2.  No murmurs  GASTROINTESTINAL: Soft, nontender, non-distended. Positive bowel sounds.  MUSCULOSKELETAL: No swelling, clubbing, or edema.  NEUROLOGIC: Awake, alert, interactive. SKIN:intact,warm,dry   Assessment & Plan:  70 year old former smoker, with acute on chronic hypoxic/hypercapnic respiratory failure due to COPD exacerbation with Enterobacter aerogenes bronchitis.  She has chronic ventilatory dependence at home.  She was using Trilogy vent via mask rather than via trach.  She has done well here with ventilatory support via trach.  Acute on chronic respiratory failure with hypoxia and hypercarbia COPD exacerbation Enterobacter aerogenes bronchitis -continue steroids changed to p.o. x5 days then resume regular dose of 5 mg daily -continue bronchodilator therapy with DuoNeb as prescribed -No need for Trelegy Ellipta, should have all  bronchodilators through neb via trach -Will need nocturnal ventilation with Trilogy via trach WITH SET RATE 12 AS BACKUP -Cefdinir 100 mg twice daily until 26 February 2021 will be a total of 7 days of antibiotics  -ct angio today   No evidence of heart failure Paroxysmal SVT vs A-fib RVR s/p adenosine, known issue for patient 2D echo shows no evidence of heart failure Normal LVEF No recurrence of SVT/A. 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 DVT prophylaxis: LMWH GI prophylaxis: PPI Lines: N/A Foley:  N/A Code Status:  full code   Labs   CBC: Recent Labs  Lab 02/23/21 0224 02/24/21 0520 02/25/21 0522 02/26/21 0419 02/27/21 0413  WBC 8.6 7.7 9.7 11.3* 9.7  NEUTROABS  --   --   --  7.9* 6.5  HGB 8.3* 8.2* 8.2* 7.9* 8.1*  HCT 26.4* 26.4* 26.9* 25.6* 26.1*  MCV 97.8 99.6 98.2 97.7 97.8  PLT 165 197 225 261 285     Basic Metabolic Panel: Recent Labs  Lab 02/23/21 0224 02/24/21 0520 02/25/21 0522 02/26/21 0419 02/27/21 0413  NA 138 142 137 137 136  K 3.8 3.3* 4.7 4.5 4.3  CL 96* 99 96* 93* 93*  CO2 34* 38* 37* 36* 38*  GLUCOSE 118* 83 85 87 88  BUN 25* 21 22 22 17   CREATININE 0.74 0.72 0.77 0.61 0.57  CALCIUM 8.7* 9.0 8.9 8.9 8.7*  MG 2.2 2.2 2.1 2.0 2.1  PHOS 3.8 3.5 3.4 3.8 4.5    GFR: Estimated Creatinine Clearance: 47 mL/min (by C-G formula based on SCr of 0.57 mg/dL). Recent Labs  Lab 02/24/21 0520 02/25/21 0522 02/26/21 0419 02/27/21 0413  WBC 7.7 9.7  11.3* 9.7     Liver Function Tests: Recent Labs  Lab 02/21/21 0606  ALBUMIN 2.7*    No results for input(s): LIPASE, AMYLASE in the last 168 hours. No results for input(s): AMMONIA in the last 168 hours.  ABG    Component Value Date/Time   PHART 7.56 (H) 02/19/2021 0500   PCO2ART 46 02/19/2021 0500   PO2ART 88 02/19/2021 0500   HCO3 41.2 (H) 02/19/2021 0500   O2SAT 97.9 02/19/2021 0500      Coagulation Profile: No results for input(s): INR, PROTIME in the last 168 hours.   Cardiac Enzymes: No results for input(s): CKTOTAL, CKMB, CKMBINDEX, TROPONINI in the last 168 hours.  Scheduled Meds:  ALPRAZolam  0.5 mg Oral TID   apixaban  5 mg Oral BID   Chlorhexidine Gluconate Cloth  6 each Topical QHS   feeding supplement  237 mL Oral BID BM   ipratropium-albuterol  3 mL Nebulization TID   metoprolol tartrate  5 mg Intravenous Q6H   multivitamin with minerals  1 tablet Oral Daily   pantoprazole (PROTONIX) IV  40 mg Intravenous QHS   predniSONE  20 mg Oral Q breakfast   Continuous Infusions:  PRN Meds:.albuterol, docusate sodium, HYDROcodone-acetaminophen, ipratropium-albuterol, polyethylene glycol  Disposition: Case management assisting with discharge planning.  Patient's DME company is [employer], they cannot have a equipment that the patient needs at home until Wednesday 7 September patient will need to remain in ICU until then. Home trilogy vent will need to be set to tidal volume of 400, respiratory rate of 12 as backup assist-control mode  Anticipate patient discharge on 9/7 on home vent.  Will need home vent at to be delivered through tracheostomy not through face mask.  Follow-up with Dr. 11/7 primary pulmonologist  Critical care provider statement:   Total critical care time: 33 minutes   Performed by: Meredeth Ide MD   Critical care time was exclusive of separately billable procedures and treating other patients.   Critical care was necessary to treat or  prevent imminent or life-threatening deterioration.   Critical care was time spent personally by me on the following activities: development of treatment plan with patient and/or surrogate as well as nursing, discussions with consultants, evaluation of patient's response to treatment, examination of patient, obtaining history from patient or surrogate, ordering and performing treatments and interventions, ordering and review of laboratory studies, ordering and review of radiographic studies, pulse oximetry and re-evaluation of patient's condition.    Karna Christmas, M.D.  Pulmonary & Critical Care Medicine

## 2021-02-27 NOTE — Progress Notes (Signed)
   02/27/21 1400  Clinical Encounter Type  Visited With Patient  Visit Type Initial;Spiritual support;Social support  Referral From Chaplain  Consult/Referral To Chaplain  Spiritual Encounters  Spiritual Needs Emotional;Prayer;Literature  Stress Factors  Patient Stress Factors Health changes;Major life changes   Chaplain made initial visitation to introduce pastoral care and assess patient need. Patient spoke of her origin. PT has lived in various states such as [place], [place], etc... PT spoke of belief in prayer, and her deep love for family (son, daughter in-law, granddaughter, and great grandchild specifically. She spoke of being incredibly happy that she stood yesterday. Chaplain prayed with PT as requested. Chaplain will follow up with PT tomorrow.

## 2021-02-28 NOTE — Progress Notes (Addendum)
Physical Therapy Treatment Patient Details Name: Virginia [name] MRN: [id number] DOB: 1950/11/30 Today's Date: 02/28/2021    History of Present Illness Pt is a 70 y.o. 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 fib, fibromyalgia, HTN, osteoporosis, and Hep C brought to ED via EMS from home with hypoxia after a fall at home.    PT Comments    Pt alert in bed, trach in place, per nursing no PMV usage during tx (builds up secretions with mobility, pt agreeable). Pt agreeable to treatment with no signs of distress or anxiety this tx session. Pt requires min-A for supine-sit transfer with assistance for trunk. Upon sitting pt demonstrates good sitting balance, able to lift arms, feet unsupported without LOB. Transferred to chair w/ CGA, RW stepping fwd, backward w/ SpO2 values > 88-90% upon sitting. Skilled PT intervention is indicated to address deficits in function, mobility, and to return to PLOF as able.  SNF recommended at discharge.   Follow Up Recommendations  SNF     Equipment Recommendations  None recommended by PT    Recommendations for Other Services       Precautions / Restrictions Precautions Precautions: Fall Precaution Comments: trach Restrictions Weight Bearing Restrictions: No    Mobility  Bed Mobility Overal bed mobility: Needs Assistance Bed Mobility: Supine to Sit Rolling: Min assist         General bed mobility comments: MinA for trunk support    Transfers Overall transfer level: Needs assistance Equipment used: Rolling walker (2 wheeled) Transfers: Sit to/from Stand Sit to Stand: Min guard         General transfer comment: Min-gaurd for safety, no LOB  Ambulation/Gait Ambulation/Gait assistance: Min guard Gait Distance (Feet): 2 Feet Assistive device: Rolling walker (2 wheeled)       General Gait Details: Pt able to step fwd and backward bed > recliner, SpO2 > 90%   Stairs             Wheelchair Mobility     Modified Rankin (Stroke Patients Only)       Balance Overall balance assessment: Needs assistance Sitting-balance support: Bilateral upper extremity supported;Feet unsupported Sitting balance-Leahy Scale: Good Sitting balance - Comments: Pt able to lift BUE while seated w/o LOB/instability   Standing balance support: Bilateral upper extremity supported;During functional activity Standing balance-Leahy Scale: Fair                              Cognition Arousal/Alertness: Awake/alert Behavior During Therapy: WFL for tasks assessed/performed Overall Cognitive Status: Within Functional Limits for tasks assessed                                        Exercises      General Comments        Pertinent Vitals/Pain Pain Assessment: Faces Faces Pain Scale: No hurt    Home Living                      Prior Function            PT Goals (current goals can now be found in the care plan section) Progress towards PT goals: Progressing toward goals    Frequency    Min 2X/week      PT Plan Current plan remains appropriate    Co-evaluation  AM-PAC PT "6 Clicks" Mobility   Outcome Measure  Help needed turning from your back to your side while in a flat bed without using bedrails?: A Little Help needed moving from lying on your back to sitting on the side of a flat bed without using bedrails?: A Little Help needed moving to and from a bed to a chair (including a wheelchair)?: A Little Help needed standing up from a chair using your arms (e.g., wheelchair or bedside chair)?: A Little Help needed to walk in hospital room?: A Lot Help needed climbing 3-5 steps with a railing? : A Lot 6 Click Score: 16    End of Session Equipment Utilized During Treatment: Gait belt;Oxygen Activity Tolerance: Patient tolerated treatment well Patient left: in chair;with call bell/phone within reach;with chair alarm set;with  nursing/sitter in room Nurse Communication: Mobility status PT Visit Diagnosis: Other abnormalities of gait and mobility (R26.89);Difficulty in walking, not elsewhere classified (R26.2);Muscle weakness (generalized) (M62.81)     Time: 1340-1400 PT Time Calculation (min) (ACUTE ONLY): 20 min  Charges:                        [employer], SPT

## 2021-02-28 NOTE — Progress Notes (Signed)
PHARMACY CONSULT NOTE  Pharmacy Consult for Electrolyte Monitoring and Replacement   Recent Labs: Potassium (mmol/L)  Date Value  02/27/2021 4.3   Magnesium (mg/dL)  Date Value  75/64/3329 2.1   Calcium (mg/dL)  Date Value  51/88/4166 8.7 (L)   Albumin (g/dL)  Date Value  12/22/1599 2.7 (L)   Phosphorus (mg/dL)  Date Value  09/32/3557 4.5   Sodium (mmol/L)  Date Value  02/27/2021 136     Assessment: 70 y.o. female with a past medical history of COPD on 3 L nasal cannula, anxiety, fibromyalgia, hypertension, atrial fibrillation, AAA presents to the emergency department for shortness of breath. Pharmacy is asked to follow and replace electrolytes while in the CCU.  Goal of Therapy:  Potassium 4.0 - 5.1 mmol/L Magnesium 2.0 - 2.4 mg/dL All Other Electrolytes WNL  Plan:  Patient stable. Follow labs as clinically indicated.  Pricilla Riffle, PharmD, BCPS Clinical Pharmacist 02/28/2021 11:40 AM

## 2021-02-28 NOTE — Progress Notes (Signed)
NAME:  Virginia [name], MRN:  [id number], DOB:  07-23-1950, LOS: 10 ADMISSION DATE:  02/18/2021, CONSULTATION DATE:  02/18/21 REFERRING MD:  Dr. Lenard Lance, CHIEF COMPLAINT:  Shortness of breath   History of Present Illness:  70 year old female presented to [facility] ED via EMS on 02/18/2021 with acute on chronic combined hypoxic and hypercapnic respiratory failure.   Per son, Virginia [name], patient lives at home alone and is assisted daily by himself and his wife as well as a home health nurse who comes out 3 times a week.  [name] reports it is he and his wife who help put her on her trilogy machine nightly.  The patient normally uses the trilogy as a CPAP mask with the tracheostomy capped overnight and is on 4.5 to 5 L nasal cannula during the day.   The patient's son reported that he and wife came down with COVID-19 last Wednesday, 02/14/2021, and have not been available to help the patient use this machine at night since 02/13/21, and so she has gone without it. Virginia [name] stated that his mother and her home health nurse reported to him that she has had multiple falls. He stated that yesterday, on 02/17/21, she fell in the bathroom and was stranded on the floor from 3 am until the home helper arrived hours later. The son, daughter-in-law and home health were concerned about the patient's respiratory status, specifically possible CO2 retention, and EMS was called. The first 2 times the patient refused to go with EMS. The third time however the patient's SpO2 was reading in the 50's with a good waveform, she was placed on CPAP and brought to the ED for evaluation.   PCCM consulted to admit to ICU as patient is requiring mechanical ventilation support.  The patient's mentation has improved but PCO2 after 1 hour is 95.  02/27/21- patient with less anxiety today.  She is smiling during my examination. She had CM consultation today and supplies should be delivered by this Friday with plan to dc home post NIV  optimization.  Trache site without exudation , vitals improving.  02/28/21- patient awaiting trache outflow valve for trilogy NIV to allow expiratory flow on Trilogy device so she can breathe at home.   Pertinent  Medical History  Atrial Fibrillation on Eliquis C. Difficile Severe COPD Chronic tracheostomy Anxiety & depression Hepatitis C Arthritis HTN Fibromyalgia Anemia CAD Thoracic AAA Significant Hospital Events: Including procedures, antibiotic start and stop dates in addition to other pertinent events   02/18/21: Admit to ICU with acute on chronic combined hypoxic/hypercapnic respiratory failure with chronic tracheostomy requiring mechanical ventilation 8/29 severe copd exacerbation 8/30 did NOT tolerate PS mode, plan for PS mode today 8/31 failed weaning trials, CT ABD  8/30 CT ABD/PELVIS Possible transition point in the central pelvis suggesting partial or developing bowel obstruction over ileus. Distal small bowel and colon are relatively collapsed-casuing failure to wean from vent 9/1 less distended on vent, alert and awake 9/2 wanting to go home, no complaints, feels she is at baseline 9/3 unable to go home yesterday due to need to have vent connected to trach (patient has been using CPAP mask), episodes of triggering the vent on pressure support last night, she will need a rate nocturnally 9/4 slept well on mechanical ventilation nocturnally 02/26/21- patient is lucid , sheson HF trache 14L/min.  She is tachycardic and reports worse chest discomfort,concern for PE will have ct angio today, she requests xanax for labored breathing and anxiety  Interim History /  Subjective:  Awake and alert this morning looks rested, slept with rate last night General surgery signed off, no evidence of abdominal process End-stage COPD vent dependent COPD exacerbation due to Enterobacter aerogenes bronchitis and hypoxia/hypercarbia  She does not endorse any complaints this morning, continues  to note that she feels more rested after sleeping with ventilator set to a rate.  Understands that she needs to have all ventilation done through the trach.     Objective   Blood pressure 139/86, pulse 88, temperature 98.7 F (37.1 C), temperature source Oral, resp. rate 16, height 5' (1.524 m), weight 53.5 kg, SpO2 95 %.    Vent Mode: PRVC FiO2 (%):  [30 %-40 %] 35 % Set Rate:  [16 bmp] 16 bmp Vt Set:  [400 mL] 400 mL PEEP:  [5 cmH20] 5 cmH20   Intake/Output Summary (Last 24 hours) at 02/28/2021 0839 Last data filed at 02/28/2021 0500 Gross per 24 hour  Intake 100 ml  Output 950 ml  Net -850 ml    Filed Weights   02/26/21 0116 02/27/21 0500 02/28/21 0500  Weight: 53.1 kg 53.1 kg 53.5 kg    ROS: A 10 point review of systems was performed and it is as noted above otherwise negative.  PHYSICAL EXAMINATION:  GENERAL: Chronically ill appearing, chronic use of accessories of respiration. EYES: Pupils equal, round, reactive to light.  No scleral icterus.  MOUTH: Oral mucosa moist, edentulous. NECK: Supple.  Tracheostomy in place, no drainage.  Vocalizing well with Passy-Muir valve. PULMONARY: Coarse, rhonchi or wheezes noted. CARDIOVASCULAR: S1 and S2.  No murmurs  GASTROINTESTINAL: Soft, nontender, non-distended. Positive bowel sounds.  MUSCULOSKELETAL: No swelling, clubbing, or edema.  NEUROLOGIC: Awake, alert, interactive. SKIN:intact,warm,dry   Assessment & Plan:  70 year old former smoker, with acute on chronic hypoxic/hypercapnic respiratory failure due to COPD exacerbation with Enterobacter aerogenes bronchitis.  She has chronic ventilatory dependence at home.  She was using Trilogy vent via mask rather than via trach.  She has done well here with ventilatory support via trach.  Acute on chronic respiratory failure with hypoxia and hypercarbia COPD exacerbation Enterobacter aerogenes bronchitis -continue steroids changed to p.o. x5 days then resume regular dose of 5 mg  daily -continue bronchodilator therapy with DuoNeb as prescribed -No need for Trelegy Ellipta, should have all bronchodilators through neb via trach -Will need nocturnal ventilation with Trilogy via trach WITH SET RATE 12 AS BACKUP -Cefdinir 100 mg twice daily until 26 February 2021 will be a total of 7 days of antibiotics  -ct angio today   No evidence of heart failure Paroxysmal SVT vs A-fib RVR s/p adenosine, known issue for patient 2D echo shows no evidence of heart failure Normal LVEF No recurrence of SVT/A. 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 DVT prophylaxis: LMWH GI prophylaxis: PPI Lines: N/A Foley:  N/A Code Status:  full code   Labs   CBC: Recent Labs  Lab 02/23/21 0224 02/24/21 0520 02/25/21 0522 02/26/21 0419 02/27/21 0413  WBC 8.6 7.7 9.7 11.3* 9.7  NEUTROABS  --   --   --  7.9* 6.5  HGB 8.3*  8.2* 8.2* 7.9* 8.1*  HCT 26.4* 26.4* 26.9* 25.6* 26.1*  MCV 97.8 99.6 98.2 97.7 97.8  PLT 165 197 225 261 285     Basic Metabolic Panel: Recent Labs  Lab 02/23/21 0224 02/24/21 0520 02/25/21 0522 02/26/21 0419 02/27/21 0413  NA 138 142 137 137 136  K 3.8 3.3* 4.7 4.5 4.3  CL 96* 99 96* 93* 93*  CO2 34* 38* 37* 36* 38*  GLUCOSE 118* 83 85 87 88  BUN 25* 21 22 22 17   CREATININE 0.74 0.72 0.77 0.61 0.57  CALCIUM 8.7* 9.0 8.9 8.9 8.7*  MG 2.2 2.2 2.1 2.0 2.1  PHOS 3.8 3.5 3.4 3.8 4.5    GFR: Estimated Creatinine Clearance: 47 mL/min (by C-G formula based on SCr  of 0.57 mg/dL). Recent Labs  Lab 02/24/21 0520 02/25/21 0522 02/26/21 0419 02/27/21 0413  WBC 7.7 9.7 11.3* 9.7     Liver Function Tests: No results for input(s): AST, ALT, ALKPHOS, BILITOT, PROT, ALBUMIN in the last 168 hours.  No results for input(s): LIPASE, AMYLASE in the last 168 hours. No results for input(s): AMMONIA in the last 168 hours.  ABG    Component Value Date/Time   PHART 7.56 (H) 02/19/2021 0500   PCO2ART 46 02/19/2021 0500   PO2ART 88 02/19/2021 0500   HCO3 41.2 (H) 02/19/2021 0500   O2SAT 97.9 02/19/2021 0500      Coagulation Profile: No results for input(s): INR, PROTIME in the last 168 hours.   Cardiac Enzymes: No results for input(s): CKTOTAL, CKMB, CKMBINDEX, TROPONINI in the last 168 hours.  Scheduled Meds:  ALPRAZolam  0.5 mg Oral TID   apixaban  5 mg Oral BID   Chlorhexidine Gluconate Cloth  6 each Topical QHS   feeding supplement  237 mL Oral BID BM   ipratropium-albuterol  3 mL Nebulization TID   metoprolol tartrate  5 mg Intravenous Q6H   multivitamin with minerals  1 tablet Oral Daily   pantoprazole (PROTONIX) IV  40 mg Intravenous QHS   Continuous Infusions:  PRN Meds:.albuterol, docusate sodium, HYDROcodone-acetaminophen, ipratropium-albuterol, polyethylene glycol  Disposition: Case management assisting with discharge planning.  Patient's DME company is [employer], they cannot have a equipment that the patient needs at home until Wednesday 7 September patient will need to remain in ICU until then. Home trilogy vent will need to be set to tidal volume of 400, respiratory rate of 12 as backup assist-control mode 9 September, M.D.  Pulmonary & Critical Care Medicine

## 2021-03-01 LAB — MAGNESIUM: Magnesium: 2.1 mg/dL (ref 1.7–2.4)

## 2021-03-01 LAB — TROPONIN I (HIGH SENSITIVITY)
Troponin I (High Sensitivity): 13 ng/L (ref <18)
Troponin I (High Sensitivity): 15 ng/L (ref <18)

## 2021-03-01 LAB — POTASSIUM: Potassium: 4.4 mmol/L (ref 3.5–5.1)

## 2021-03-01 MED ORDER — METOPROLOL TARTRATE 5 MG/5ML IV SOLN
5.0000 mg | Freq: Once | INTRAVENOUS | Status: AC
  Administered 2021-03-01: 5 mg via INTRAVENOUS

## 2021-03-01 NOTE — Progress Notes (Signed)
Nutrition Follow-up  DOCUMENTATION CODES:   Non-severe (moderate) malnutrition in context of acute illness/injury  INTERVENTION:   Ensure Enlive po BID, each supplement provides 350 kcal and 20 grams of protein  Magic cup TID with meals, each supplement provides 290 kcal and 9 grams of protein  MVI po daily   NUTRITION DIAGNOSIS:   Moderate Malnutrition related to acute illness as evidenced by mild fat depletion, mild muscle depletion.  GOAL:   Patient will meet greater than or equal to 90% of their needs -not met   MONITOR:   PO intake, Supplement acceptance, Vent status, Labs, Weight trends, Skin, I & O's  ASSESSMENT:   70 y.o. 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 fib, fibromyalgia, HTN, osteoporosis, and Hep C brought to ED via EMS from home with hypoxia after a fall at home.  Pt on trach collar during the day and vent at night. Pt up and walking around with therapy at time of RD visit today. Pt documented to be eating <50% of meals in hospital; pt is drinking her Ensure supplements and is receiving Magic Cups on her meal trays. Refeed labs stable. Per chart, pt is weight stable since admission. Of note, pt with h/o PEG tube that she refused to use and eventually requested for tube to be removed. Plan is for discharge to home once medically appropriate.   Medications reviewed and include: MVI, protonix  Labs reviewed: K 4.4 wnl, P 4.5 wnl, Mg 2.1 wnl Hgb 8.1(L), Hct 26.1(L)  Diet Order:   Diet Order             DIET DYS 3 Room service appropriate? Yes with Assist; Fluid consistency: Thin  Diet effective now                  EDUCATION NEEDS:   No education needs have been identified at this time  Skin:  Skin Assessment: Reviewed RN Assessment  Last BM:  9/6- TYPE 4  Height:   Ht Readings from Last 1 Encounters:  02/28/21 5' (1.524 m)    Weight:   Wt Readings from Last 1 Encounters:  03/01/21 54 kg    Ideal Body Weight:   45.5 kg  BMI:  Body mass index is 23.25 kg/m.  Estimated Nutritional Needs:   Kcal:  1400-1600kcal/day  Protein:  70-80g/day  Fluid:  1.3-1.5L/day  Koleen Distance MS, RD, LDN Please refer to [facility] for RD and/or RD on-call/weekend/after hours pager

## 2021-03-01 NOTE — Progress Notes (Signed)
Occupational Therapy Treatment Patient Details Name: Virginia [name] MRN: [id number] DOB: [birth year] Today's Date: 03/01/2021    History of present illness Pt is a 70 y.o. 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 fib, fibromyalgia, HTN, osteoporosis, and Hep C brought to ED via EMS from home with hypoxia after a fall at home.   OT comments  Upon entering the room, pt supine in bed and reports very busy morning. Pt needing encouragement for participation. She does not want to sit in recliner because she has back pain. RN arrives to room and MD requesting functional ambulation this session. Respiratory arrives to set up O2 tank with trach and pt on 10Ls. PMSV removed for mobility secondary to increased secretions with task. Pt performs bed mobility with close supervision. Pt standing with min guard and ambulating 10' with RW and min guard with HR increasing to 233bpm (highest seen) and pt sitting immediately in chair. Pt is symptomatic and reports feelings of "racing heart" and dizziness. Pt able rest and return to 100-110 before attempting to return to bed and same situation occurred. Pt needing more assistance to return to bed for safety. MD and RN present to continue to assess pt as therapist exits the room. All needs within reach.    Follow Up Recommendations  SNF    Equipment Recommendations  None recommended by OT       Precautions / Restrictions Precautions Precautions: Fall Precaution Comments: trach Restrictions Weight Bearing Restrictions: No       Mobility Bed Mobility Overal bed mobility: Needs Assistance Bed Mobility: Supine to Sit;Sit to Supine     Supine to sit: Supervision Sit to supine: Mod assist   General bed mobility comments: Pt needing increased assistance returning to bed secondary to medical event during session    Transfers Overall transfer level: Needs assistance Equipment used: Rolling walker (2 wheeled) Transfers: Stand Pivot  Transfers;Sit to/from Stand Sit to Stand: Min guard Stand pivot transfers: Min guard            Balance Overall balance assessment: Needs assistance Sitting-balance support: Bilateral upper extremity supported;Feet unsupported Sitting balance-Leahy Scale: Good     Standing balance support: Bilateral upper extremity supported;During functional activity Standing balance-Leahy Scale: Fair                             ADL either performed or assessed with clinical judgement     Vision Patient Visual Report: No change from baseline            Cognition Arousal/Alertness: Awake/alert Behavior During Therapy: WFL for tasks assessed/performed Overall Cognitive Status: Within Functional Limits for tasks assessed                                                     Pertinent Vitals/ Pain       Pain Assessment: No/denies pain         Frequency  Min 2X/week        Progress Toward Goals  OT Goals(current goals can now be found in the care plan section)  Progress towards OT goals: Progressing toward goals  Acute Rehab OT Goals Patient Stated Goal: to return home OT Goal Formulation: With patient Time For Goal Achievement: 03/13/21 Potential to Achieve Goals: Good  Plan Discharge plan remains appropriate;Frequency remains appropriate       AM-PAC OT "6 Clicks" Daily Activity     Outcome Measure   Help from another person eating meals?: None Help from another person taking care of personal grooming?: None Help from another person toileting, which includes using toliet, bedpan, or urinal?: A Lot Help from another person bathing (including washing, rinsing, drying)?: A Little Help from another person to put on and taking off regular upper body clothing?: A Little Help from another person to put on and taking off regular lower body clothing?: A Lot 6 Click Score: 18    End of Session Equipment Utilized During Treatment: Rolling  walker;Other (comment);Oxygen (trach)  OT Visit Diagnosis: Unsteadiness on feet (R26.81);Repeated falls (R29.6);Muscle weakness (generalized) (M62.81);History of falling (Z91.81)   Activity Tolerance Other (comment);Treatment limited secondary to medical complications (Comment) (HR increased to 230's)   Patient Left in bed;with call bell/phone within reach;with bed alarm set   Nurse Communication Mobility status;Other (comment) (elevated HR)        Time: 1011-1040 OT Time Calculation (min): 29 min  Charges: OT General Charges $OT Visit: 1 Visit OT Treatments $Therapeutic Activity: 23-37 mins  Jackquline Denmark, MS, OTR/L , CBIS ascom [phone]  03/01/21, 12:57 PM

## 2021-03-01 NOTE — Progress Notes (Signed)
NAME:  Virginia [name], MRN:  [id number], DOB:  09-20-1950, LOS: 11 ADMISSION DATE:  02/18/2021, CONSULTATION DATE:  02/18/21 REFERRING MD:  Dr. Lenard Lance, CHIEF COMPLAINT:  Shortness of breath   History of Present Illness:  70 year old female presented to [facility] ED via EMS on 02/18/2021 with acute on chronic combined hypoxic and hypercapnic respiratory failure.   Per son, Virginia [name], patient lives at home alone and is assisted daily by himself and his wife as well as a home health nurse who comes out 3 times a week.  [name] reports it is he and his wife who help put her on her trilogy machine nightly.  The patient normally uses the trilogy as a CPAP mask with the tracheostomy capped overnight and is on 4.5 to 5 L nasal cannula during the day.   The patient's son reported that he and wife came down with COVID-19 last Wednesday, 02/14/2021, and have not been available to help the patient use this machine at night since 02/13/21, and so she has gone without it. Virginia [name] stated that his mother and her home health nurse reported to him that she has had multiple falls. He stated that yesterday, on 02/17/21, she fell in the bathroom and was stranded on the floor from 3 am until the home helper arrived hours later. The son, daughter-in-law and home health were concerned about the patient's respiratory status, specifically possible CO2 retention, and EMS was called. The first 2 times the patient refused to go with EMS. The third time however the patient's SpO2 was reading in the 50's with a good waveform, she was placed on CPAP and brought to the ED for evaluation.   PCCM consulted to admit to ICU as patient is requiring mechanical ventilation support.  The patient's mentation has improved but PCO2 after 1 hour is 95.  02/27/21- patient with less anxiety today.  She is smiling during my examination. She had CM consultation today and supplies should be delivered by this Friday with plan to dc home post NIV  optimization.  Trache site without exudation , vitals improving.  02/28/21- patient awaiting trache outflow valve for trilogy NIV to allow expiratory flow on Trilogy device so she can breathe at home.   03/01/21- patient slept better with alprazolam, She is awaiting supplies for ventilator at home to be used with trache. Family education session scheduled. PT and OT on going to treat severe physical deconditioning.   Pertinent  Medical History  Atrial Fibrillation on Eliquis C. Difficile Severe COPD Chronic tracheostomy Anxiety & depression Hepatitis C Arthritis HTN Fibromyalgia Anemia CAD Thoracic AAA Significant Hospital Events: Including procedures, antibiotic start and stop dates in addition to other pertinent events   02/18/21: Admit to ICU with acute on chronic combined hypoxic/hypercapnic respiratory failure with chronic tracheostomy requiring mechanical ventilation 8/29 severe copd exacerbation 8/30 did NOT tolerate PS mode, plan for PS mode today 8/31 failed weaning trials, CT ABD  8/30 CT ABD/PELVIS Possible transition point in the central pelvis suggesting partial or developing bowel obstruction over ileus. Distal small bowel and colon are relatively collapsed-casuing failure to wean from vent 9/1 less distended on vent, alert and awake 9/2 wanting to go home, no complaints, feels she is at baseline 9/3 unable to go home yesterday due to need to have vent connected to trach (patient has been using CPAP mask), episodes of triggering the vent on pressure support last night, she will need a rate nocturnally 9/4 slept well on mechanical ventilation nocturnally 02/26/21-  patient is lucid , sheson HF trache 14L/min.  She is tachycardic and reports worse chest discomfort,concern for PE will have ct angio today, she requests xanax for labored breathing and anxiety  Interim History / Subjective:  Awake and alert this morning looks rested, slept with rate last night General surgery signed  off, no evidence of abdominal process End-stage COPD vent dependent COPD exacerbation due to Enterobacter aerogenes bronchitis and hypoxia/hypercarbia  She does not endorse any complaints this morning, continues to note that she feels more rested after sleeping with ventilator set to a rate.  Understands that she needs to have all ventilation done through the trach.     Objective   Blood pressure (!) 147/95, pulse 95, temperature 97.9 F (36.6 C), temperature source Axillary, resp. rate 15, height 5' (1.524 m), weight 54 kg, SpO2 95 %.    Vent Mode: PRVC FiO2 (%):  [30 %-50 %] 35 % Set Rate:  [16 bmp] 16 bmp Vt Set:  [400 mL] 400 mL PEEP:  [5 cmH20] 5 cmH20   Intake/Output Summary (Last 24 hours) at 03/01/2021 1015 Last data filed at 03/01/2021 0500 Gross per 24 hour  Intake 100 ml  Output 875 ml  Net -775 ml    Filed Weights   02/27/21 0500 02/28/21 0500 03/01/21 0216  Weight: 53.1 kg 53.5 kg 54 kg    ROS: A 10 point review of systems was performed and it is as noted above otherwise negative.  PHYSICAL EXAMINATION:  GENERAL: Chronically ill appearing, chronic use of accessories of respiration. EYES: Pupils equal, round, reactive to light.  No scleral icterus.  MOUTH: Oral mucosa moist, edentulous. NECK: Supple.  Tracheostomy in place, no drainage.  Vocalizing well with Passy-Muir valve. PULMONARY: Coarse, rhonchi or wheezes noted. CARDIOVASCULAR: S1 and S2.  No murmurs  GASTROINTESTINAL: Soft, nontender, non-distended. Positive bowel sounds.  MUSCULOSKELETAL: No swelling, clubbing, or edema.  NEUROLOGIC: Awake, alert, interactive. SKIN:intact,warm,dry   Assessment & Plan:  70 year old former smoker, with acute on chronic hypoxic/hypercapnic respiratory failure due to COPD exacerbation with Enterobacter aerogenes bronchitis.  She has chronic ventilatory dependence at home.  She was using Trilogy vent via mask rather than via trach.  She has done well here with  ventilatory support via trach.  Acute on chronic respiratory failure with hypoxia and hypercarbia COPD exacerbation Enterobacter aerogenes bronchitis -continue steroids changed to p.o. x5 days then resume regular dose of 5 mg daily -continue bronchodilator therapy with DuoNeb as prescribed -No need for Trelegy Ellipta, should have all bronchodilators through neb via trach -Will need nocturnal ventilation with Trilogy via trach WITH SET RATE 12 AS BACKUP -Cefdinir 100 mg twice daily until 26 February 2021 will be a total of 7 days of antibiotics  -ct angio today   No evidence of heart failure Paroxysmal SVT vs A-fib RVR s/p adenosine, known issue for patient 2D echo shows no evidence of heart failure Normal LVEF No recurrence of SVT/A. 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 DVT prophylaxis: LMWH GI prophylaxis: PPI Lines: N/A Foley:  N/A Code Status:  full code   Labs   CBC:  Recent Labs  Lab 02/23/21 0224 02/24/21 0520 02/25/21 0522 02/26/21 0419 02/27/21 0413  WBC 8.6 7.7 9.7 11.3* 9.7  NEUTROABS  --   --   --  7.9* 6.5  HGB 8.3* 8.2* 8.2* 7.9* 8.1*  HCT 26.4* 26.4* 26.9* 25.6* 26.1*  MCV 97.8 99.6 98.2 97.7 97.8  PLT 165 197 225 261 285     Basic Metabolic Panel: Recent Labs  Lab 02/23/21 0224 02/24/21 0520 02/25/21 0522 02/26/21 0419 02/27/21 0413  NA 138 142 137 137 136  K 3.8 3.3* 4.7 4.5 4.3  CL 96* 99 96* 93* 93*  CO2 34* 38* 37* 36* 38*  GLUCOSE 118* 83 85  87 88  BUN 25* 21 22 22 17   CREATININE 0.74 0.72 0.77 0.61 0.57  CALCIUM 8.7* 9.0 8.9 8.9 8.7*  MG 2.2 2.2 2.1 2.0 2.1  PHOS 3.8 3.5 3.4 3.8 4.5    GFR: Estimated Creatinine Clearance: 47 mL/min (by C-G formula based on SCr of 0.57 mg/dL). Recent Labs  Lab 02/24/21 0520 02/25/21 0522 02/26/21 0419 02/27/21 0413  WBC 7.7 9.7 11.3* 9.7     Liver Function Tests: No results for input(s): AST, ALT, ALKPHOS, BILITOT, PROT, ALBUMIN in the last 168 hours.  No results for input(s): LIPASE, AMYLASE in the last 168 hours. No results for input(s): AMMONIA in the last 168 hours.  ABG    Component Value Date/Time   PHART 7.56 (H) 02/19/2021 0500   PCO2ART 46 02/19/2021 0500   PO2ART 88 02/19/2021 0500   HCO3 41.2 (H) 02/19/2021 0500   O2SAT 97.9 02/19/2021 0500      Coagulation Profile: No results for input(s): INR, PROTIME in the last 168 hours.   Cardiac Enzymes: No results for input(s): CKTOTAL, CKMB, CKMBINDEX, TROPONINI in the last 168 hours.  Scheduled Meds:  ALPRAZolam  0.5 mg Oral TID   apixaban  5 mg Oral BID   Chlorhexidine Gluconate Cloth  6 each Topical QHS   feeding supplement  237 mL Oral BID BM   ipratropium-albuterol  3 mL Nebulization TID   metoprolol tartrate  5 mg Intravenous Q6H   multivitamin with minerals  1 tablet Oral Daily   pantoprazole (PROTONIX) IV  40 mg Intravenous QHS   Continuous Infusions:  PRN Meds:.albuterol, docusate sodium, HYDROcodone-acetaminophen, ipratropium-albuterol, polyethylene glycol  Disposition: Case management assisting with discharge planning.  Patient's DME company is [employer], they cannot have a equipment that the patient needs at home until Wednesday 7 September patient will need to remain in ICU until then. Home trilogy vent will need to be set to tidal volume of 400, respiratory rate of 12 as backup assist-control mode 9 September, M.D.  Pulmonary & Critical Care Medicine

## 2021-03-01 NOTE — Progress Notes (Signed)
1015 patient walked with therapy distance she would at home to get to the bathroom pt heart rate increased to 220s patient reports feeling dizziness and feeling of her rate racing pt recovered with a few mins of rest when ambulating back to the bed same thing happened EKG done lopressor 5mg  IVP given labs ordered  heart rate decreased back to NSR rate in 80s patient reports feeling better back to normal as heart rate decreased back to normal

## 2021-03-01 NOTE — TOC Progression Note (Signed)
Transition of Care [facility]) - Progression Note    Patient Details  Name: Virginia [name] MRN: [id number] Date of Birth: [birth year]  Transition of Care [facility]) CM/SW Contact  Hetty Ely, RN Phone Number: 03/01/2021, 2:25 PM  Clinical Narrative: Corky Mull from [facility], was says they have been providing [facility] for patient. I informed them of discharge tomorrow. They request that Attending put in orders for HHPT/OT/RN/Aide. Message to Attending.    Expected Discharge Plan: Home w Home Health Services Barriers to Discharge: Continued Medical Work up  Expected Discharge Plan and Services Expected Discharge Plan: Home w Home Health Services In-house Referral: Clinical Social Work   Post Acute Care Choice: Durable Medical Equipment Living arrangements for the past 2 months: Apartment Expected Discharge Date: 02/23/21                                     Social Determinants of Health (SDOH) Interventions    Readmission Risk Interventions No flowsheet data found.

## 2021-03-02 NOTE — TOC Progression Note (Addendum)
Transition of Care [facility]) - Progression Note    Patient Details  Name: Virginia [name] MRN: [id number] Date of Birth: 09/12/1950  Transition of Care [facility]) CM/SW Contact  Hetty Ely, RN Phone Number: 03/02/2021, 1:44 PM  Clinical Narrative:  Sandy Salaam [phone] who says that she delivered the equipment this morning at 9:30am and completed the education on use of equipment with family, however family has concerns about the Janina Mayo, and patient living alone. Please have Respiratory to discuss trach care with the family and let me know if she can discharge today so I can provide transport.  Spoke with Betsy Pries, [phone] who voices delivering equipment and doing education with family this morning, however family voices not feeling comfortable with Trach care, and that patient lives alone. Talked with [employer] office staff, Delia Chimes [phone] who is concerned about family and that the supplies for the size trach is discontinued. Discussed with Attending and Charge Nurse, patient discharge will be postponed and to process for Rehab. Services. Spoke with patient about need for Rehab services and not being safe to discharge home at this time, Rehab will benefit her and the family, patient voices understanding and agrees to short-term rehab. FL2 completed and search started.      Expected Discharge Plan: Home w Home Health Services Barriers to Discharge: Continued Medical Work up  Expected Discharge Plan and Services Expected Discharge Plan: Home w Home Health Services In-house Referral: Clinical Social Work   Post Acute Care Choice: Durable Medical Equipment Living arrangements for the past 2 months: Apartment Expected Discharge Date: 02/23/21                                     Social Determinants of Health (SDOH) Interventions    Readmission Risk Interventions No flowsheet data found.

## 2021-03-02 NOTE — Progress Notes (Signed)
PHARMACY CONSULT NOTE  Pharmacy Consult for Electrolyte Monitoring and Replacement   Recent Labs: Potassium (mmol/L)  Date Value  03/01/2021 4.4   Magnesium (mg/dL)  Date Value  77/41/4239 2.1   Calcium (mg/dL)  Date Value  53/20/2334 8.7 (L)   Albumin (g/dL)  Date Value  35/68/6168 2.7 (L)   Phosphorus (mg/dL)  Date Value  37/29/0211 4.5   Sodium (mmol/L)  Date Value  02/27/2021 136     Assessment: 70 y.o. female with a past medical history of COPD on 3 L nasal cannula, anxiety, fibromyalgia, hypertension, atrial fibrillation, AAA presents to the emergency department for shortness of breath. Pharmacy is asked to follow and replace electrolytes while in the CCU.  Goal of Therapy:  Potassium 4.0 - 5.1 mmol/L Magnesium 2.0 - 2.4 mg/dL All Other Electrolytes WNL  Plan:  Patient stable. Follow labs as clinically indicated. Patient to discharge soon.  Pricilla Riffle, PharmD, BCPS Clinical Pharmacist 03/02/2021 11:18 AM

## 2021-03-02 NOTE — [place] FL2 (Signed)
[place] MEDICAID FL2 LEVEL OF CARE SCREENING TOOL     IDENTIFICATION  Patient Name: Virginia [name] Birthdate: 05/02/1951 Sex: female Admission Date (Current Location): 02/18/2021  [facility] and [place] Number:  Chiropodist and Address:  [facility], [address] [place], [place] [address]      Provider Number: [id number]  Attending Physician Name and Address:  Vida Rigger, MD  Relative Name and Phone Number:  Tiaja Hagan [phone]    Current Level of Care: Hospital Recommended Level of Care: Skilled Nursing Facility Prior Approval Number:    Date Approved/Denied:   PASRR Number: [id number]  Discharge Plan: SNF    Current Diagnoses: Patient Active Problem List   Diagnosis Date Noted   Pneumonia due to enterobacter aerogenes (HCC) 02/23/2021   Acute respiratory failure (HCC) 02/18/2021   SVT (supraventricular tachycardia) (HCC) 02/18/2021   Malnutrition of moderate degree 01/08/2021   On mechanically assisted ventilation (HCC) 01/07/2021   Hyperlipidemia 01/07/2021   Anxiety 01/07/2021   Acute on chronic respiratory failure (HCC) 01/06/2021   Anorexia 08/04/2020   Severe protein-calorie malnutrition (HCC) 08/04/2020   Pressure injury of skin 07/14/2020   Diarrhea    Reactive thrombocytosis 07/12/2020   Aspiration pneumonia of both lower lobes due to gastric secretions (HCC) 07/07/2020   Acute urinary retention 07/06/2020   Acute metabolic encephalopathy 07/06/2020   Acute on chronic respiratory failure with hypoxia and hypercapnia (HCC) 07/06/2020   COPD exacerbation (HCC)    Respiratory failure (HCC) 06/06/2020   Acute respiratory failure with hypoxia (HCC) 06/02/2020   COPD with acute exacerbation (HCC) 06/02/2020   Shortness of breath 06/02/2020   GERD (gastroesophageal reflux disease) 06/02/2020   Iron deficiency anemia 01/22/2020   Anemia of chronic disease 01/17/2019   Coronary artery disease involving  native coronary artery of native heart 04/30/2018   Palpitations 04/30/2018   Chronic heartburn 04/12/2018   Dysphagia 04/12/2018   Thoracic aortic aneurysm without rupture (HCC) 08/27/2017   Status post reverse total shoulder replacement, 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   SOBOE (shortness of breath on exertion) 01/24/2017   AF (paroxysmal atrial fibrillation) (HCC) 11/09/2016   Chronic hypoxemic respiratory failure (HCC) 12/06/2015   Depression 12/06/2015   Fibromyalgia 12/06/2015   Hep C w/o coma, chronic (HCC) 12/06/2015   History of tracheostomy 12/06/2015   Hypertension 12/06/2015   Localized osteoporosis without current pathological fracture 12/06/2015   PEG (percutaneous endoscopic gastrostomy) status (HCC) 12/06/2015   Peripheral neuropathy, idiopathic 12/06/2015   RLS (restless legs syndrome) 12/06/2015   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   COPD with acute lower respiratory infection (HCC) 11/29/2015    Orientation RESPIRATION BLADDER Height & Weight     Self, Time, Situation, Place  Tracheostomy, Other (Comment) (trilogy at night) External catheter Weight: 55.1 kg Height:  5' (152.4 cm)  BEHAVIORAL SYMPTOMS/MOOD NEUROLOGICAL BOWEL NUTRITION STATUS      Continent Diet  AMBULATORY STATUS COMMUNICATION OF NEEDS Skin   Limited Assist Verbally Other (Comment) (Ecchymosis bilateral arms)                       Personal Care Assistance Level of Assistance  Bathing, Feeding, Dressing Bathing Assistance: Limited assistance Feeding assistance: Limited assistance Dressing Assistance: Limited assistance     Functional Limitations Info  Sight, Hearing, Speech Sight Info: Adequate Hearing Info: Adequate Speech Info: Adequate    SPECIAL CARE FACTORS FREQUENCY  PT (By licensed PT), OT (By licensed OT)     PT  Frequency: 5x day OT Frequency: 5x day            Contractures Contractures Info: Not present    Additional Factors Info  Code Status, Allergies Code  Status Info: Full Allergies Info: No Known allergies           Current Medications (03/02/2021):  This is the current hospital active medication list Current Facility-Administered Medications  Medication Dose Route Frequency Provider Last Rate Last Admin   albuterol (PROVENTIL) (2.5 MG/3ML) 0.083% nebulizer solution 2.5 mg  2.5 mg Nebulization Q4H PRN Salena Saner, MD   2.5 mg at 02/26/21 1226   ALPRAZolam (XANAX) tablet 0.5 mg  0.5 mg Oral TID Lianne Cure, NP   0.5 mg at 03/02/21 4967   apixaban (ELIQUIS) tablet 5 mg  5 mg Oral BID Salena Saner, MD   5 mg at 03/02/21 5916   Chlorhexidine Gluconate Cloth 2 % PADS 6 each  6 each Topical QHS Erin Fulling, MD   6 each at 03/02/21 0636   docusate sodium (COLACE) capsule 100 mg  100 mg Oral BID PRN Rust-Chester, Cecelia Byars, NP       feeding supplement (ENSURE ENLIVE / ENSURE PLUS) liquid 237 mL  237 mL Oral BID BM Salena Saner, MD   237 mL at 03/02/21 1339   HYDROcodone-acetaminophen (NORCO) 7.5-325 MG per tablet 1 tablet  1 tablet Oral Q6H PRN Salena Saner, MD   1 tablet at 03/02/21 1320   ipratropium-albuterol (DUONEB) 0.5-2.5 (3) MG/3ML nebulizer solution 3 mL  3 mL Nebulization Q4H PRN Rust-Chester, Britton L, NP   3 mL at 03/02/21 1319   ipratropium-albuterol (DUONEB) 0.5-2.5 (3) MG/3ML nebulizer solution 3 mL  3 mL Nebulization TID Vida Rigger, MD   3 mL at 03/02/21 0737   metoprolol tartrate (LOPRESSOR) injection 5 mg  5 mg Intravenous Q6H Rust-Chester, Britton L, NP   5 mg at 03/02/21 1204   multivitamin with minerals tablet 1 tablet  1 tablet Oral Daily Salena Saner, MD   1 tablet at 03/02/21 0923   pantoprazole (PROTONIX) injection 40 mg  40 mg Intravenous QHS Rust-Chester, Britton L, NP   40 mg at 03/01/21 2146   polyethylene glycol (MIRALAX / GLYCOLAX) packet 17 g  17 g Oral Daily PRN Rust-Chester, Cecelia Byars, NP   17 g at 02/23/21 3846     Discharge Medications: Please see discharge summary for a  list of discharge medications.  Relevant Imaging Results:  Relevant Lab Results:   Additional Information ss# [id number]  Hetty Ely, RN

## 2021-03-02 NOTE — Progress Notes (Signed)
NAME:  Virginia [name], MRN:  [id number], DOB:  [birth year], LOS: 12 ADMISSION DATE:  02/18/2021, CONSULTATION DATE:  02/18/21 REFERRING MD:  Dr. 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   Per son, Virginia [name], patient lives at home alone and is assisted daily by himself and his wife as well as a home health nurse who comes out 3 times a week.  Virginia [name] reports it is he and his wife who help put her on her trilogy machine nightly.  The patient normally uses the trilogy as a CPAP mask with the tracheostomy capped overnight and is on 4.5 to 5 L nasal cannula during the day.   The patient's son reported that he and wife came down with COVID-19 last Wednesday, 02/14/2021, and have not been available to help the patient use this machine at night since 02/13/21, and so she has gone without it. Virginia [name] stated that his mother and her home health nurse reported to him that she has had multiple falls. He stated that yesterday, on 02/17/21, she fell in the bathroom and was stranded on the floor from 3 am until the home helper arrived hours later. The son, daughter-in-law and home health were concerned about the patient's respiratory status, specifically possible CO2 retention, and EMS was called. The first 2 times the patient refused to go with EMS. The third time however the patient's SpO2 was reading in the 50's with a good waveform, she was placed on CPAP and brought to the ED for evaluation.   PCCM consulted to admit to ICU as patient is requiring mechanical ventilation support.  The patient's mentation has improved but PCO2 after 1 hour is 95.  02/27/21- patient with less anxiety today.  She is smiling during my examination. She had CM consultation today and supplies should be delivered by this Friday with plan to dc home post NIV  optimization.  Trache site without exudation , vitals improving.  02/28/21- patient awaiting trache outflow valve for trilogy NIV to allow expiratory flow on Trilogy device so she can breathe at home.   03/01/21- patient slept better with alprazolam, She is awaiting supplies for ventilator at home to be used with trache. Family education session scheduled. PT and OT on going to treat severe physical deconditioning.   03/02/21- patient had some confusion with family members and they cant have home ventilator due to inability to understand function of NIV nor how to properly use equipment  Pertinent  Medical History  Atrial Fibrillation on Eliquis C. Difficile Severe COPD Chronic tracheostomy Anxiety & depression Hepatitis C Arthritis HTN Fibromyalgia Anemia CAD Thoracic AAA Significant Hospital Events: Including procedures, antibiotic start and stop dates in addition to other pertinent events   02/18/21: Admit to ICU with acute on chronic combined hypoxic/hypercapnic respiratory failure with chronic tracheostomy requiring mechanical ventilation 8/29 severe copd exacerbation 8/30 did NOT tolerate PS mode, plan for PS mode today 8/31 failed weaning trials, CT ABD  8/30 CT ABD/PELVIS Possible transition point in the central pelvis suggesting partial or developing bowel obstruction over ileus. Distal small bowel and colon are relatively collapsed-casuing failure to wean from vent 9/1 less distended on vent, alert and awake 9/2 wanting to go home, no complaints, feels she is at baseline 9/3 unable to go home yesterday due to need to have vent connected to trach (patient  has been using CPAP mask), episodes of triggering the vent on pressure support last night, she will need a rate nocturnally 9/4 slept well on mechanical ventilation nocturnally 02/26/21- patient is lucid , sheson HF trache 14L/min.  She is tachycardic and reports worse chest discomfort,concern for PE will have ct angio today, she  requests xanax for labored breathing and anxiety  Interim History / Subjective:  Awake and alert this morning looks rested, slept with rate last night General surgery signed off, no evidence of abdominal process End-stage COPD vent dependent COPD exacerbation due to Enterobacter aerogenes bronchitis and hypoxia/hypercarbia  She does not endorse any complaints this morning, continues to note that she feels more rested after sleeping with ventilator set to a rate.  Understands that she needs to have all ventilation done through the trach.     Objective   Blood pressure (!) 90/57, pulse (!) 102, temperature 98.1 F (36.7 C), temperature source Oral, resp. rate 20, height 5' (1.524 m), weight 55.1 kg, SpO2 99 %.    Vent Mode: PRVC FiO2 (%):  [30 %-40 %] 40 % Set Rate:  [16 bmp] 16 bmp Vt Set:  [400 mL] 400 mL PEEP:  [5 cmH20] 5 cmH20   Intake/Output Summary (Last 24 hours) at 03/02/2021 1024 Last data filed at 03/02/2021 0000 Gross per 24 hour  Intake 240 ml  Output 400 ml  Net -160 ml    Filed Weights   02/28/21 0500 03/01/21 0216 03/02/21 0500  Weight: 53.5 kg 54 kg 55.1 kg    ROS: A 10 point review of systems was performed and it is as noted above otherwise negative.  PHYSICAL EXAMINATION:  GENERAL: Chronically ill appearing, chronic use of accessories of respiration. EYES: Pupils equal, round, reactive to light.  No scleral icterus.  MOUTH: Oral mucosa moist, edentulous. NECK: Supple.  Tracheostomy in place, no drainage.  Vocalizing well with Passy-Muir valve. PULMONARY: Coarse, rhonchi or wheezes noted. CARDIOVASCULAR: S1 and S2.  No murmurs  GASTROINTESTINAL: Soft, nontender, non-distended. Positive bowel sounds.  MUSCULOSKELETAL: No swelling, clubbing, or edema.  NEUROLOGIC: Awake, alert, interactive. SKIN:intact,warm,dry   Assessment & Plan:  70 year old former smoker, with acute on chronic hypoxic/hypercapnic respiratory failure due to COPD exacerbation with  Enterobacter aerogenes bronchitis.  She has chronic ventilatory dependence at home.  She was using Trilogy vent via mask rather than via trach.  She has done well here with ventilatory support via trach.  Acute on chronic respiratory failure with hypoxia and hypercarbia COPD exacerbation Enterobacter aerogenes bronchitis -continue steroids changed to p.o. x5 days then resume regular dose of 5 mg daily -continue bronchodilator therapy with DuoNeb as prescribed -No need for Trelegy Ellipta, should have all bronchodilators through neb via trach -Will need nocturnal ventilation with Trilogy via trach WITH SET RATE 12 AS BACKUP -Cefdinir 100 mg twice daily until 26 February 2021 will be a total of 7 days of antibiotics  -ct angio today   No evidence of heart failure Paroxysmal SVT vs A-fib RVR s/p adenosine, known issue for patient 2D echo shows no evidence of heart failure Normal LVEF No recurrence of SVT/A. 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 DVT prophylaxis: LMWH GI prophylaxis: PPI Lines: N/A Foley:  N/A Code Status:  full code   Labs   CBC: Recent Labs  Lab 02/24/21 0520 02/25/21 0522 02/26/21 0419 02/27/21 0413  WBC 7.7 9.7 11.3* 9.7  NEUTROABS  --   --  7.9* 6.5  HGB 8.2* 8.2* 7.9* 8.1*  HCT 26.4* 26.9* 25.6* 26.1*  MCV 99.6 98.2 97.7 97.8  PLT 197 225 261 285     Basic Metabolic Panel: Recent Labs  Lab 02/24/21 0520 02/25/21 0522 02/26/21 0419 02/27/21 0413  03/01/21 1053  NA 142 137 137 136  --   K 3.3* 4.7 4.5 4.3 4.4  CL 99 96* 93* 93*  --   CO2 38* 37* 36* 38*  --   GLUCOSE 83 85 87 88  --   BUN 21 22 22 17   --   CREATININE 0.72 0.77 0.61 0.57  --   CALCIUM 9.0 8.9 8.9 8.7*  --   MG 2.2 2.1 2.0 2.1 2.1  PHOS 3.5 3.4 3.8 4.5  --     GFR: Estimated Creatinine Clearance: 50.9 mL/min (by C-G formula based on SCr of 0.57 mg/dL). Recent Labs  Lab 02/24/21 0520 02/25/21 0522 02/26/21 0419 02/27/21 0413  WBC 7.7 9.7 11.3* 9.7     Liver Function Tests: No results for input(s): AST, ALT, ALKPHOS, BILITOT, PROT, ALBUMIN in the last 168 hours.  No results for input(s): LIPASE, AMYLASE in the last 168 hours. No results for input(s): AMMONIA in the last 168 hours.  ABG    Component Value Date/Time   PHART 7.56 (H) 02/19/2021 0500   PCO2ART 46 02/19/2021 0500   PO2ART 88 02/19/2021 0500   HCO3 41.2 (H) 02/19/2021 0500   O2SAT 97.9 02/19/2021 0500      Coagulation Profile: No results for input(s): INR, PROTIME in the last 168 hours.   Cardiac Enzymes: No results for input(s): CKTOTAL, CKMB, CKMBINDEX, TROPONINI in the last 168 hours.  Scheduled Meds:  ALPRAZolam  0.5 mg Oral TID   apixaban  5 mg Oral BID   Chlorhexidine Gluconate Cloth  6 each Topical QHS   feeding supplement  237 mL Oral BID BM   ipratropium-albuterol  3 mL Nebulization TID   metoprolol tartrate  5 mg Intravenous Q6H   multivitamin with minerals  1 tablet Oral Daily   pantoprazole (PROTONIX) IV  40 mg Intravenous QHS   Continuous Infusions:  PRN Meds:.albuterol, docusate sodium, HYDROcodone-acetaminophen, ipratropium-albuterol, polyethylene glycol  Disposition: Case management assisting with discharge planning.  Patient's DME company is [employer], they cannot have a equipment that the patient needs at home until Wednesday 7 September patient will need to remain in ICU until then. Home trilogy vent will need to be set to tidal volume of 400, respiratory  rate of 12 as backup assist-control mode    9 September, M.D.  Pulmonary & Critical Care Medicine

## 2021-03-03 LAB — CBC WITH DIFFERENTIAL/PLATELET
Abs Immature Granulocytes: 0.12 10*3/uL — ABNORMAL HIGH (ref 0.00–0.07)
Basophils Absolute: 0.1 10*3/uL (ref 0.0–0.1)
Basophils Relative: 1 %
Eosinophils Absolute: 0.4 10*3/uL (ref 0.0–0.5)
Eosinophils Relative: 4 %
HCT: 27.9 % — ABNORMAL LOW (ref 36.0–46.0)
Hemoglobin: 8.6 g/dL — ABNORMAL LOW (ref 12.0–15.0)
Immature Granulocytes: 1 %
Lymphocytes Relative: 13 %
Lymphs Abs: 1.1 10*3/uL (ref 0.7–4.0)
MCH: 30.1 pg (ref 26.0–34.0)
MCHC: 30.8 g/dL (ref 30.0–36.0)
MCV: 97.6 fL (ref 80.0–100.0)
Monocytes Absolute: 0.7 10*3/uL (ref 0.1–1.0)
Monocytes Relative: 8 %
Neutro Abs: 6.6 10*3/uL (ref 1.7–7.7)
Neutrophils Relative %: 73 %
Platelets: 423 10*3/uL — ABNORMAL HIGH (ref 150–400)
RBC: 2.86 MIL/uL — ABNORMAL LOW (ref 3.87–5.11)
RDW: 15.5 % (ref 11.5–15.5)
WBC: 9 10*3/uL (ref 4.0–10.5)
nRBC: 0 % (ref 0.0–0.2)

## 2021-03-03 LAB — BASIC METABOLIC PANEL
Anion gap: 5 (ref 5–15)
BUN: 17 mg/dL (ref 8–23)
CO2: 35 mmol/L — ABNORMAL HIGH (ref 22–32)
Calcium: 8.8 mg/dL — ABNORMAL LOW (ref 8.9–10.3)
Chloride: 97 mmol/L — ABNORMAL LOW (ref 98–111)
Creatinine, Ser: 0.58 mg/dL (ref 0.44–1.00)
GFR, Estimated: >60 mL/min (ref >60)
Glucose, Bld: 89 mg/dL (ref 70–99)
Potassium: 3.8 mmol/L (ref 3.5–5.1)
Sodium: 137 mmol/L (ref 135–145)

## 2021-03-03 LAB — MAGNESIUM: Magnesium: 2 mg/dL (ref 1.7–2.4)

## 2021-03-03 LAB — PHOSPHORUS: Phosphorus: 4.2 mg/dL (ref 2.5–4.6)

## 2021-03-03 MED ORDER — METOPROLOL TARTRATE 50 MG PO TABS
50.0000 mg | ORAL_TABLET | Freq: Two times a day (BID) | ORAL | Status: DC
  Administered 2021-03-03 – 2021-03-11 (×17): 50 mg via ORAL
  Filled 2021-03-03 (×17): qty 1

## 2021-03-03 MED ORDER — PANTOPRAZOLE SODIUM 40 MG PO TBEC
40.0000 mg | DELAYED_RELEASE_TABLET | Freq: Every day | ORAL | Status: DC
  Administered 2021-03-03 – 2021-03-11 (×9): 40 mg via ORAL
  Filled 2021-03-03 (×9): qty 1

## 2021-03-03 NOTE — Progress Notes (Signed)
Asked to provide support to [patient. Checked in with her multiple times today.

## 2021-03-03 NOTE — Progress Notes (Signed)
PHARMACY CONSULT NOTE  Pharmacy Consult for Electrolyte Monitoring and Replacement   Recent Labs: Potassium (mmol/L)  Date Value  03/03/2021 3.8   Magnesium (mg/dL)  Date Value  97/94/8016 2.0   Calcium (mg/dL)  Date Value  55/37/4827 8.8 (L)   Albumin (g/dL)  Date Value  07/86/7544 2.7 (L)   Phosphorus (mg/dL)  Date Value  92/06/69 4.2   Sodium (mmol/L)  Date Value  03/03/2021 137     Assessment: 70 y.o. female with a past medical history of COPD on 3 L nasal cannula, anxiety, fibromyalgia, hypertension, atrial fibrillation, AAA presents to the emergency department for shortness of breath. Pharmacy is asked to follow and replace electrolytes while in the CCU.  Goal of Therapy:  Potassium 4.0 - 5.1 mmol/L Magnesium 2.0 - 2.4 mg/dL All Other Electrolytes WNL  Plan:  Patient stable. Follow labs as clinically indicated. Patient to discharge soon.  Tressie Ellis 03/03/2021 9:25 AM

## 2021-03-03 NOTE — Progress Notes (Signed)
NAME:  Virginia [name], MRN:  [id number], DOB:  1951-01-12, LOS: 13 ADMISSION DATE:  02/18/2021, CONSULTATION DATE:  02/18/21 REFERRING MD:  Dr. 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   Per son, [name], patient lives at home alone and is assisted daily by himself and his wife as well as a home health nurse who comes out 3 times a week.  [name] reports it is he and his wife who help put her on her trilogy machine nightly.  The patient normally uses the trilogy as a CPAP mask with the tracheostomy capped overnight and is on 4.5 to 5 L nasal cannula during the day.   The patient's son reported that he and wife came down with COVID-19 last Wednesday, 02/14/2021, and have not been available to help the patient use this machine at night since 02/13/21, and so she has gone without it. [name] stated that his mother and her home health nurse reported to him that she has had multiple falls. He stated that yesterday, on 02/17/21, she fell in the bathroom and was stranded on the floor from 3 am until the home helper arrived hours later. The son, daughter-in-law and home health were concerned about the patient's respiratory status, specifically possible CO2 retention, and EMS was called. The first 2 times the patient refused to go with EMS. The third time however the patient's SpO2 was reading in the 50's with a good waveform, she was placed on CPAP and brought to the ED for evaluation.   PCCM consulted to admit to ICU as patient is requiring mechanical ventilation support.  The patient's mentation has improved but PCO2 after 1 hour is 95.  02/27/21- patient with less anxiety today.  She is smiling during my examination. She had CM consultation today and supplies should be delivered by this Friday with plan to dc home post NIV  optimization.  Trache site without exudation , vitals improving.  02/28/21- patient awaiting trache outflow valve for trilogy NIV to allow expiratory flow on Trilogy device so she can breathe at home.   03/01/21- patient slept better with alprazolam, She is awaiting supplies for ventilator at home to be used with trache. Family education session scheduled. PT and OT on going to treat severe physical deconditioning.   03/02/21- patient had some confusion with family members and they cant have home ventilator due to inability to understand function of NIV nor how to properly use equipment  03/03/21- patient with no overnight events. Anxiety level is improved  Pertinent  Medical History  Atrial Fibrillation on Eliquis C. Difficile Severe COPD Chronic tracheostomy Anxiety & depression Hepatitis C Arthritis HTN Fibromyalgia Anemia CAD Thoracic AAA Significant Hospital Events: Including procedures, antibiotic start and stop dates in addition to other pertinent events   02/18/21: Admit to ICU with acute on chronic combined hypoxic/hypercapnic respiratory failure with chronic tracheostomy requiring mechanical ventilation 8/29 severe copd exacerbation 8/30 did NOT tolerate PS mode, plan for PS mode today 8/31 failed weaning trials, CT ABD  8/30 CT ABD/PELVIS Possible transition point in the central pelvis suggesting partial or developing bowel obstruction over ileus. Distal small bowel and colon are relatively collapsed-casuing failure to wean from vent 9/1 less distended on vent, alert and awake 9/2 wanting to go home, no complaints, feels she is at baseline 9/3 unable to go home  yesterday due to need to have vent connected to trach (patient has been using CPAP mask), episodes of triggering the vent on pressure support last night, she will need a rate nocturnally 9/4 slept well on mechanical ventilation nocturnally 02/26/21- patient is lucid , sheson HF trache 14L/min.  She is tachycardic and reports  worse chest discomfort,concern for PE will have ct angio today, she requests xanax for labored breathing and anxiety  Interim History / Subjective:  Awake and alert this morning looks rested, slept with rate last night General surgery signed off, no evidence of abdominal process End-stage COPD vent dependent COPD exacerbation due to Enterobacter aerogenes bronchitis and hypoxia/hypercarbia  She does not endorse any complaints this morning, continues to note that she feels more rested after sleeping with ventilator set to a rate.  Understands that she needs to have all ventilation done through the trach.     Objective   Blood pressure 123/87, pulse 98, temperature 98 F (36.7 C), temperature source Axillary, resp. rate 17, height 5' (1.524 m), weight 55.1 kg, SpO2 93 %.    Vent Mode: PRVC FiO2 (%):  [30 %-40 %] 40 % Set Rate:  [16 bmp] 16 bmp Vt Set:  [400 mL] 400 mL PEEP:  [5 cmH20] 5 cmH20   Intake/Output Summary (Last 24 hours) at 03/03/2021 1409 Last data filed at 03/03/2021 1200 Gross per 24 hour  Intake 440 ml  Output 450 ml  Net -10 ml    Filed Weights   02/28/21 0500 03/01/21 0216 03/02/21 0500  Weight: 53.5 kg 54 kg 55.1 kg    ROS: A 10 point review of systems was performed and it is as noted above otherwise negative.  PHYSICAL EXAMINATION:  GENERAL: Chronically ill appearing, chronic use of accessories of respiration. EYES: Pupils equal, round, reactive to light.  No scleral icterus.  MOUTH: Oral mucosa moist, edentulous. NECK: Supple.  Tracheostomy in place, no drainage.  Vocalizing well with Passy-Muir valve. PULMONARY: Coarse, rhonchi or wheezes noted. CARDIOVASCULAR: S1 and S2.  No murmurs  GASTROINTESTINAL: Soft, nontender, non-distended. Positive bowel sounds.  MUSCULOSKELETAL: No swelling, clubbing, or edema.  NEUROLOGIC: Awake, alert, interactive. SKIN:intact,warm,dry   Assessment & Plan:  70 year old former smoker, with acute on chronic  hypoxic/hypercapnic respiratory failure due to COPD exacerbation with Enterobacter aerogenes bronchitis.  She has chronic ventilatory dependence at home.  She was using Trilogy vent via mask rather than via trach.  She has done well here with ventilatory support via trach.  Acute on chronic respiratory failure with hypoxia and hypercarbia COPD exacerbation Enterobacter aerogenes bronchitis -continue steroids changed to p.o. x5 days then resume regular dose of 5 mg daily -continue bronchodilator therapy with DuoNeb as prescribed -No need for Trelegy Ellipta, should have all bronchodilators through neb via trach -Will need nocturnal ventilation with Trilogy via trach WITH SET RATE 12 AS BACKUP -Cefdinir 100 mg twice daily until 26 February 2021 will be a total of 7 days of antibiotics  -ct angio today   No evidence of heart failure Paroxysmal SVT vs A-fib RVR s/p adenosine, known issue for patient 2D echo shows no evidence of heart failure Normal LVEF No recurrence of SVT/A. 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 DVT prophylaxis: LMWH GI prophylaxis: PPI Lines: N/A Foley:  N/A Code Status:  full code   Labs   CBC: Recent Labs  Lab 02/25/21 0522 02/26/21 0419 02/27/21 0413 03/03/21 0539  WBC 9.7 11.3* 9.7 9.0  NEUTROABS  --  7.9* 6.5 6.6  HGB 8.2* 7.9* 8.1* 8.6*  HCT 26.9* 25.6* 26.1* 27.9*  MCV 98.2 97.7 97.8 97.6  PLT 225 261 285 423*     Basic Metabolic Panel: Recent Labs   Lab 02/25/21 0522 02/26/21 0419 02/27/21 0413 03/01/21 1053 03/03/21 0539  NA 137 137 136  --  137  K 4.7 4.5 4.3 4.4 3.8  CL 96* 93* 93*  --  97*  CO2 37* 36* 38*  --  35*  GLUCOSE 85 87 88  --  89  BUN 22 22 17   --  17  CREATININE 0.77 0.61 0.57  --  0.58  CALCIUM 8.9 8.9 8.7*  --  8.8*  MG 2.1 2.0 2.1 2.1 2.0  PHOS 3.4 3.8 4.5  --  4.2    GFR: Estimated Creatinine Clearance: 50.9 mL/min (by C-G formula based on SCr of 0.58 mg/dL). Recent Labs  Lab 02/25/21 0522 02/26/21 0419 02/27/21 0413 03/03/21 0539  WBC 9.7 11.3* 9.7 9.0     Liver Function Tests: No results for input(s): AST, ALT, ALKPHOS, BILITOT, PROT, ALBUMIN in the last 168 hours.  No results for input(s): LIPASE, AMYLASE in the last 168 hours. No results for input(s): AMMONIA in the last 168 hours.  ABG    Component Value Date/Time   PHART 7.56 (H) 02/19/2021 0500   PCO2ART 46 02/19/2021 0500   PO2ART 88 02/19/2021 0500   HCO3 41.2 (H) 02/19/2021 0500   O2SAT 97.9 02/19/2021 0500      Coagulation Profile: No results for input(s): INR, PROTIME in the last 168 hours.   Cardiac Enzymes: No results for input(s): CKTOTAL, CKMB, CKMBINDEX, TROPONINI in the last 168 hours.  Scheduled Meds:  ALPRAZolam  0.5 mg Oral TID   apixaban  5 mg Oral BID   Chlorhexidine Gluconate Cloth  6 each Topical QHS   feeding supplement  237 mL Oral BID BM   ipratropium-albuterol  3 mL Nebulization TID   metoprolol tartrate  50 mg Oral BID   multivitamin with minerals  1 tablet Oral Daily   pantoprazole  40 mg Oral QHS   Continuous Infusions:  PRN Meds:.albuterol, docusate sodium, HYDROcodone-acetaminophen, ipratropium-albuterol, polyethylene glycol    02/21/2021, M.D.  Pulmonary & Critical Care Medicine

## 2021-03-03 NOTE — Progress Notes (Signed)
PHARMACIST - PHYSICIAN COMMUNICATION  CONCERNING: IV to Oral Route Change Policy  RECOMMENDATION: This patient is receiving Protonix by the intravenous route.  Based on criteria approved by the Pharmacy and Therapeutics Committee, the intravenous medication(s) is/are being converted to the equivalent oral dose form(s).   DESCRIPTION: These criteria include: The patient is eating (either orally or via tube) and/or has been taking other orally administered medications for a least 24 hours The patient has no evidence of active gastrointestinal bleeding or impaired GI absorption (gastrectomy, short bowel, patient on TNA or NPO).  If you have questions about this conversion, please contact the Pharmacy Department   Tressie Ellis, [facility] 03/03/2021 11:11 AM

## 2021-03-04 NOTE — Progress Notes (Signed)
PHARMACY CONSULT NOTE  Pharmacy Consult for Electrolyte Monitoring and Replacement   Recent Labs: Potassium (mmol/L)  Date Value  03/03/2021 3.8   Magnesium (mg/dL)  Date Value  92/06/69 2.0   Calcium (mg/dL)  Date Value  21/97/5883 8.8 (L)   Albumin (g/dL)  Date Value  25/49/8264 2.7 (L)   Phosphorus (mg/dL)  Date Value  15/83/0940 4.2   Sodium (mmol/L)  Date Value  03/03/2021 137     Assessment: 70 y.o. female with a past medical history of COPD on 3 L nasal cannula, anxiety, fibromyalgia, hypertension, atrial fibrillation, AAA presents to the emergency department for shortness of breath. Pharmacy is asked to follow and replace electrolytes while in the CCU.  Goal of Therapy:  Potassium 4.0 - 5.1 mmol/L Magnesium 2.0 - 2.4 mg/dL All Other Electrolytes WNL  Plan:  --Electrolytes are stable. Will discontinue consult at this time. Defer further ordering of labs and electrolyte repletion to PCCM team --Pharmacy will continue to monitor peripherally  Tressie Ellis 03/04/2021 9:06 AM

## 2021-03-04 NOTE — Progress Notes (Addendum)
Physical Therapy Treatment Patient Details Name: Virginia [name] MRN: [id number] DOB: [birth year] Today's Date: 03/04/2021    History of Present Illness Pt is a 70 y.o. 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 fib, fibromyalgia, HTN, osteoporosis, and Hep C brought to ED via EMS from home with hypoxia after a fall at home.    PT Comments    Pt supine in bed, reports constant LBP w/ nursing provided meds prior to session. Pt denies sitting up in recliner due to LBP despite pt education and attempts to ensure recliner more comfortable. Pt requires education and consistent encouragement for participation due to high anxiety levels, although overall cooperative through treatment.   PMSV removed for mobility secondary to secretions, on 10L O2 w/ trach collar. Vitals monitored throughout session with max HR 105 bpm, SpO2 90% w/ standing marching. Pt required min-A for sit > supine transfer for LE. Transfers w/ min-guard, RW for safety. Cues for correct hand placement, pt able to demonstrate correct technique w/o further cuing. Pt agreeable to standing marching w/ RW, min-guard but denied further amb due to heightened anxiety. Skilled PT intervention is indicated to address deficits in function, mobility, and to return to PLOF as able.  Discharge recommendations remain SNF.    Follow Up Recommendations  SNF     Equipment Recommendations  None recommended by PT    Recommendations for Other Services       Precautions / Restrictions Precautions Precautions: Fall Precaution Comments: trach Restrictions Weight Bearing Restrictions: No    Mobility  Bed Mobility Overal bed mobility: Needs Assistance Bed Mobility: Supine to Sit;Sit to Supine     Supine to sit: Supervision Sit to supine: Min assist   General bed mobility comments: Min-A for LE sit > supine, repositions and scoots in bed without physical assist    Transfers Overall transfer level: Needs  assistance Equipment used: Rolling walker (2 wheeled) Transfers: Sit to/from Stand Sit to Stand: Min guard         General transfer comment: Min-gaurd for safety, no LOB  Ambulation/Gait Ambulation/Gait assistance: Min guard Gait Distance (Feet): 2 Feet Assistive device: Rolling walker (2 wheeled)       General Gait Details: Pt able to step fwd, back,sideways to chair w/o LOB   Stairs             Wheelchair Mobility    Modified Rankin (Stroke Patients Only)       Balance Overall balance assessment: Needs assistance Sitting-balance support: Bilateral upper extremity supported;Feet unsupported Sitting balance-Leahy Scale: Good Sitting balance - Comments: able to raise both arms overhead w/o LOB   Standing balance support: Bilateral upper extremity supported;During functional activity Standing balance-Leahy Scale: Fair                              Cognition Arousal/Alertness: Awake/alert Behavior During Therapy: WFL for tasks assessed/performed;Anxious Overall Cognitive Status: Within Functional Limits for tasks assessed                                        Exercises Other Exercises Other Exercises: standing marching 5 reps/each leg x 1 then seated rest break and attempted standing marching 10 reps/leg, min-gaurd, RW; Other Exercises: Pt education on decreasing fear with movement, gradual exercise progression, and safety    General Comments  Pertinent Vitals/Pain Pain Assessment: Faces Faces Pain Scale: Hurts little more Pain Location: Lower back Pain Descriptors / Indicators: Aching;Sore Pain Intervention(s): Limited activity within patient's tolerance;Monitored during session;Premedicated before session;Repositioned;Patient requesting pain meds-RN notified;Relaxation    Home Living                      Prior Function            PT Goals (current goals can now be found in the care plan section) Acute  Rehab PT Goals Patient Stated Goal: to return home PT Goal Formulation: With patient Time For Goal Achievement: 03/09/21 Potential to Achieve Goals: Good Progress towards PT goals: Progressing toward goals    Frequency    Min 2X/week      PT Plan Current plan remains appropriate    Co-evaluation              AM-PAC PT "6 Clicks" Mobility   Outcome Measure  Help needed turning from your back to your side while in a flat bed without using bedrails?: None Help needed moving from lying on your back to sitting on the side of a flat bed without using bedrails?: A Little Help needed moving to and from a bed to a chair (including a wheelchair)?: A Little Help needed standing up from a chair using your arms (e.g., wheelchair or bedside chair)?: A Little Help needed to walk in hospital room?: A Little Help needed climbing 3-5 steps with a railing? : A Lot 6 Click Score: 18    End of Session Equipment Utilized During Treatment: Gait belt;Oxygen Activity Tolerance: Patient tolerated treatment well Patient left: in bed;with call bell/phone within reach;with chair alarm set;with SCD's reapplied Nurse Communication: Mobility status PT Visit Diagnosis: Other abnormalities of gait and mobility (R26.89);Difficulty in walking, not elsewhere classified (R26.2);Muscle weakness (generalized) (M62.81)     Time: 8416-6063 PT Time Calculation (min) (ACUTE ONLY): 34 min  Charges:  $Therapeutic Exercise: 23-37 mins                     [employer], SPT

## 2021-03-04 NOTE — Progress Notes (Signed)
Placed pt on vent, pt states she can not breath with the vent, Pt spo2 100%, VT 457. Suctioned pt for copious amount of thick tan secretions. Pt still insists she can not breath. Placed pt back on ATC and she states she is breathing better. Will try again later. Informed RN.

## 2021-03-05 DIAGNOSIS — J9621 Acute and chronic respiratory failure with hypoxia: Secondary | ICD-10-CM | POA: Diagnosis not present

## 2021-03-05 DIAGNOSIS — J9622 Acute and chronic respiratory failure with hypercapnia: Secondary | ICD-10-CM | POA: Diagnosis not present

## 2021-03-05 LAB — BASIC METABOLIC PANEL
Anion gap: 9 (ref 5–15)
BUN: 16 mg/dL (ref 8–23)
CO2: 33 mmol/L — ABNORMAL HIGH (ref 22–32)
Calcium: 9.1 mg/dL (ref 8.9–10.3)
Chloride: 95 mmol/L — ABNORMAL LOW (ref 98–111)
Creatinine, Ser: 0.64 mg/dL (ref 0.44–1.00)
GFR, Estimated: >60 mL/min (ref >60)
Glucose, Bld: 99 mg/dL (ref 70–99)
Potassium: 4.1 mmol/L (ref 3.5–5.1)
Sodium: 137 mmol/L (ref 135–145)

## 2021-03-05 LAB — CBC WITH DIFFERENTIAL/PLATELET
Abs Immature Granulocytes: 0.07 10*3/uL (ref 0.00–0.07)
Basophils Absolute: 0.1 10*3/uL (ref 0.0–0.1)
Basophils Relative: 1 %
Eosinophils Absolute: 0.3 10*3/uL (ref 0.0–0.5)
Eosinophils Relative: 3 %
HCT: 30.5 % — ABNORMAL LOW (ref 36.0–46.0)
Hemoglobin: 9.7 g/dL — ABNORMAL LOW (ref 12.0–15.0)
Immature Granulocytes: 1 %
Lymphocytes Relative: 9 %
Lymphs Abs: 0.8 10*3/uL (ref 0.7–4.0)
MCH: 30.9 pg (ref 26.0–34.0)
MCHC: 31.8 g/dL (ref 30.0–36.0)
MCV: 97.1 fL (ref 80.0–100.0)
Monocytes Absolute: 0.8 10*3/uL (ref 0.1–1.0)
Monocytes Relative: 9 %
Neutro Abs: 7.5 10*3/uL (ref 1.7–7.7)
Neutrophils Relative %: 77 %
Platelets: 454 10*3/uL — ABNORMAL HIGH (ref 150–400)
RBC: 3.14 MIL/uL — ABNORMAL LOW (ref 3.87–5.11)
RDW: 15.1 % (ref 11.5–15.5)
WBC: 9.6 10*3/uL (ref 4.0–10.5)
nRBC: 0 % (ref 0.0–0.2)

## 2021-03-05 LAB — GLUCOSE, CAPILLARY: Glucose-Capillary: 135 mg/dL — ABNORMAL HIGH (ref 70–99)

## 2021-03-05 LAB — PHOSPHORUS: Phosphorus: 4.1 mg/dL (ref 2.5–4.6)

## 2021-03-05 LAB — MAGNESIUM: Magnesium: 2 mg/dL (ref 1.7–2.4)

## 2021-03-05 MED ORDER — ENSURE ENLIVE PO LIQD
237.0000 mL | Freq: Three times a day (TID) | ORAL | Status: DC
  Administered 2021-03-05 – 2021-03-12 (×18): 237 mL via ORAL

## 2021-03-05 MED ORDER — DIAZEPAM 5 MG/ML IJ SOLN
2.5000 mg | INTRAMUSCULAR | Status: AC
  Administered 2021-03-05: 2.5 mg via INTRAVENOUS
  Filled 2021-03-05: qty 2

## 2021-03-05 NOTE — Progress Notes (Signed)
Physical Therapy Treatment Patient Details Name: Virginia [name] MRN: [id number] DOB: [birth year] Today's Date: 03/05/2021   History of Present Illness Pt is a 70 y.o. 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 fib, fibromyalgia, HTN, osteoporosis, and Hep C brought to ED via EMS from home with hypoxia after a fall at home.    PT Comments    Pt making progress towards her therapy goals. Pt demonstrating fear of movement and has concerns of her HR with ambulation. PT attempted multiple times for patient to ambulate however, pt refusing OOB mobility today. Pt completed seated and supine therex with 4L O2 [place] and >90% throughout treatment. Recommendation for SNF is still appropriate. Pt will continue to benefit from skilled PT services to improve functional capacity and return to prior level of function.   Recommendations for follow up therapy are one component of a multi-disciplinary discharge planning process, led by the attending physician.  Recommendations may be updated based on patient status, additional functional criteria and insurance authorization.  Follow Up Recommendations  SNF     Equipment Recommendations  None recommended by PT    Recommendations for Other Services       Precautions / Restrictions Precautions Precautions: Fall Precaution Comments: trach Restrictions Weight Bearing Restrictions: No     Mobility  Bed Mobility Overal bed mobility: Needs Assistance Bed Mobility: Supine to Sit;Sit to Supine     Supine to sit: Supervision;HOB elevated Sit to supine: Min assist   General bed mobility comments: SPV and verbal cues for patient to move to EOB. MinA with management of LE back into bed and management of lines leads and tubes    Transfers Overall transfer level: Needs assistance Equipment used: None Transfers: Sit to/from Stand Sit to Stand: Supervision         General transfer comment: Close SPV/SBA sit to  stand  Ambulation/Gait Ambulation/Gait assistance: Supervision Gait Distance (Feet): 2 Feet Assistive device: None Gait Pattern/deviations: Step-to pattern;Trunk flexed;Wide base of support     General Gait Details: Requiring SPV/SBA for lateral stepping at EOB for repositioning. Pt declining any further mobility after multiple attempts/approaches   Stairs             Wheelchair Mobility    Modified Rankin (Stroke Patients Only)       Balance   Sitting-balance support: Feet supported;Single extremity supported Sitting balance-Leahy Scale: Good Sitting balance - Comments: Pt able to sit EOB and complete therex with 1 UE support   Standing balance support: During functional activity Standing balance-Leahy Scale: Fair Standing balance comment: Pt maintained balance with close SBA during side stepping at EOB without AD           Cognition Arousal/Alertness: Awake/alert Behavior During Therapy: WFL for tasks assessed/performed;Anxious Overall Cognitive Status: Within Functional Limits for tasks assessed               Exercises Other Exercises Other Exercises: Seated AROM all bilateral: Marching 2 x 10, LAQ 2 x 10, Heel Raises 2 x 10, Glute Sets 1 x 10, Hip ADD 2 x 10 with 3 second hold, Hip ABD 2 x 10 with PT providing light resistance throughout range. Supine therex bilat: SAQ 1 x 10. Pt educated on performing supine SAQ and ankle pumps to maintain LE strength while in hospital.    General Comments        Pertinent Vitals/Pain Pain Assessment: No/denies pain    Home Living      Prior  Function            PT Goals (current goals can now be found in the care plan section) Acute Rehab PT Goals Patient Stated Goal: to return home PT Goal Formulation: With patient Time For Goal Achievement: 03/09/21 Potential to Achieve Goals: Good Progress towards PT goals: Progressing toward goals    Frequency    Min 2X/week      PT Plan Current plan  remains appropriate    Co-evaluation              AM-PAC PT "6 Clicks" Mobility   Outcome Measure  Help needed turning from your back to your side while in a flat bed without using bedrails?: None Help needed moving from lying on your back to sitting on the side of a flat bed without using bedrails?: A Little Help needed moving to and from a bed to a chair (including a wheelchair)?: A Little Help needed standing up from a chair using your arms (e.g., wheelchair or bedside chair)?: A Little Help needed to walk in hospital room?: A Little Help needed climbing 3-5 steps with a railing? : A Lot 6 Click Score: 18    End of Session Equipment Utilized During Treatment: Oxygen Activity Tolerance: Patient tolerated treatment well Patient left: in bed;with call bell/phone within reach;with bed alarm set Nurse Communication: Mobility status PT Visit Diagnosis: Other abnormalities of gait and mobility (R26.89);Difficulty in walking, not elsewhere classified (R26.2);Muscle weakness (generalized) (M62.81)     Time: 1245-8099 PT Time Calculation (min) (ACUTE ONLY): 25 min  Charges:  $Therapeutic Exercise: 23-37 mins                     Verl Blalock, SPT   Verl Blalock 03/05/2021, 4:37 PM

## 2021-03-05 NOTE — Plan of Care (Signed)
Patient's trach is capped with 3 lpm nasal canula. Tolerating well

## 2021-03-05 NOTE — Progress Notes (Signed)
NAME:  Virginia [name], MRN:  [id number], DOB:  August 24, 1950, LOS: 15 ADMISSION DATE:  02/18/2021, CONSULTATION DATE:  02/18/21 REFERRING MD:  Dr. Lenard Lance, CHIEF COMPLAINT:  Shortness of breath   History of Present Illness:  70 year old female presented to [facility] ED via EMS on 02/18/2021 with acute on chronic combined hypoxic and hypercapnic respiratory failure.   Per son, Virginia [name], patient lives at home alone and is assisted daily by himself and his wife as well as a home health nurse who comes out 3 times a week.  [name] reports it is he and his wife who help put her on her trilogy machine nightly.  The patient normally uses the trilogy as a CPAP mask with the tracheostomy capped overnight and is on 4.5 to 5 L nasal cannula during the day.   The patient's son reported that he and wife came down with COVID-19 last Wednesday, 02/14/2021, and have not been available to help the patient use this machine at night since 02/13/21, and so she has gone without it. Virginia [name] stated that his mother and her home health nurse reported to him that she has had multiple falls. He stated that yesterday, on 02/17/21, she fell in the bathroom and was stranded on the floor from 3 am until the home helper arrived hours later. The son, daughter-in-law and home health were concerned about the patient's respiratory status, specifically possible CO2 retention, and EMS was called. The first 2 times the patient refused to go with EMS. The third time however the patient's SpO2 was reading in the 50's with a good waveform, she was placed on CPAP and brought to the ED for evaluation.   PCCM consulted to admit to ICU as patient is requiring mechanical ventilation support.  The patient's mentation has improved but PCO2 after 1 hour is 95.  02/27/21- patient with less anxiety today.  She is smiling during my examination. She had CM consultation today and supplies should be delivered by this Friday with plan to dc home post NIV  optimization.  Trache site without exudation , vitals improving.  02/28/21- patient awaiting trache outflow valve for trilogy NIV to allow expiratory flow on Trilogy device so she can breathe at home.   03/01/21- patient slept better with alprazolam, She is awaiting supplies for ventilator at home to be used with trache. Family education session scheduled. PT and OT on going to treat severe physical deconditioning.   03/02/21- patient had some confusion with family members and they cant have home ventilator due to inability to understand function of NIV nor how to properly use equipment  03/03/21- patient with no overnight events. Anxiety level is improved  Pertinent  Medical History  Atrial Fibrillation on Eliquis C. Difficile Severe COPD Chronic tracheostomy Anxiety & depression Hepatitis C Arthritis HTN Fibromyalgia Anemia CAD Thoracic AAA Significant Hospital Events: Including procedures, antibiotic start and stop dates in addition to other pertinent events   02/18/21: Admit to ICU with acute on chronic combined hypoxic/hypercapnic respiratory failure with chronic tracheostomy requiring mechanical ventilation 8/29 severe copd exacerbation 8/30 did NOT tolerate PS mode, plan for PS mode today 8/31 failed weaning trials, CT ABD  8/30 CT ABD/PELVIS Possible transition point in the central pelvis suggesting partial or developing bowel obstruction over ileus. Distal small bowel and colon are relatively collapsed-casuing failure to wean from vent 9/1 less distended on vent, alert and awake 9/2 wanting to go home, no complaints, feels she is at baseline 9/3 unable to go home  yesterday due to need to have vent connected to trach (patient has been using CPAP mask), episodes of triggering the vent on pressure support last night, she will need a rate nocturnally 9/4 slept well on mechanical ventilation nocturnally 02/26/21- patient is lucid , sheson HF trache 14L/min.  She is tachycardic and reports  worse chest discomfort,concern for PE will have ct angio today, she requests xanax for labored breathing and anxiety  Interim History / Subjective:  Vent at night TCT during the day Resting comfortably No distress COPD exacerbation due to Enterobacter aerogenes bronchitis and hypoxia/hypercarbia     Objective   Blood pressure (!) 158/124, pulse (!) 120, temperature 98.4 F (36.9 C), temperature source Oral, resp. rate (!) 28, height 5' (1.524 m), weight 51.6 kg, SpO2 96 %.    Vent Mode: PRVC FiO2 (%):  [30 %-60 %] 30 % Set Rate:  [16 bmp] 16 bmp Vt Set:  [400 mL] 400 mL PEEP:  [5 cmH20] 5 cmH20  No intake or output data in the 24 hours ending 03/05/21 0758  Filed Weights   03/02/21 0500 03/04/21 0500 03/05/21 0425  Weight: 55.1 kg 54.1 kg 51.6 kg    Review of Systems: No chest pain No res distress Other:  All other systems negative  Physical Examination:   General Appearance: No distress on vent s/p trach EYES PERRLA, EOM intact.   NECK Supple, No JVD Pulmonary: normal breath sounds, No wheezing.  CardiovascularNormal S1,S2.  No m/r/g.   Abdomen: Benign, Soft, non-tender. Skin:   warm, no rashes, no ecchymosis  Extremities: normal, no cyanosis, clubbing. Neuro:without focal findings,  speech normal  PSYCHIATRIC: Mood, affect within normal limits.     Assessment & Plan:  70 year old former smoker, with acute on chronic hypoxic/hypercapnic respiratory failure due to COPD exacerbation with Enterobacter aerogenes bronchitis.  She has chronic ventilatory dependence at home.  She was using Trilogy vent via mask rather than via trach.  She has done well here with ventilatory support via trach.  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 DVT prophylaxis: LMWH GI prophylaxis: PPI Lines: N/A Foley:  N/A Code Status:  full code   Labs   CBC: Recent Labs  Lab 02/27/21 0413 03/03/21 0539 03/05/21 0432  WBC 9.7 9.0 9.6  NEUTROABS 6.5 6.6 7.5  HGB 8.1* 8.6* 9.7*  HCT 26.1* 27.9* 30.5*  MCV 97.8 97.6 97.1  PLT 285 423* 454*     Basic Metabolic Panel: Recent Labs  Lab 02/27/21 0413 03/01/21 1053 03/03/21 0539 03/05/21 0432  NA 136  --  137 137  K 4.3 4.4 3.8 4.1  CL 93*  --  97* 95*  CO2 38*  --  35* 33*  GLUCOSE 88  --  89 99  BUN 17  --  17 16  CREATININE 0.57  --  0.58 0.64  CALCIUM 8.7*  --  8.8* 9.1  MG 2.1 2.1 2.0 2.0  PHOS 4.5  --  4.2 4.1    GFR: Estimated Creatinine Clearance: 47 mL/min (by C-G formula based on SCr of 0.64 mg/dL). Recent Labs  Lab  02/27/21 0413 03/03/21 0539 03/05/21 0432  WBC 9.7 9.0 9.6    DISPOSITION  TRANSFER TO SNF if possible?     Lucie Leather, M.D.  [facility]  Medical Director [facility] Medical Director [place] [facility]

## 2021-03-05 NOTE — Progress Notes (Signed)
Patient is resting well in bed. Eyes are closed with no distress noted at this time. Given IV diazepam per orders. Noted effective. Call bell in reach. Will continue to monitor.

## 2021-03-05 NOTE — Progress Notes (Signed)
Patient is began to calm down and is requesting a 'shot' for anxiety and that this writer stay at the bedside.Marland Kitchen Respirations are 21. O2 sat 97% on the ventilator. No rocking movements noted. Will continue to monitor at the bedside.

## 2021-03-05 NOTE — Progress Notes (Addendum)
1350 # 6 cuffed tracheostomy changed to # 6 cuff less tracheostomy per Dr. Belia Heman. Patient tolerated procedure well. Oxygen saturation increased to 96-100%. Patient stated the new tracheostomy feels funny. Informed by RT, after numerous questions about new tracheostomy,the inner cannula is not disposable. It must be removed and cleaned daily then placed back into trach. After 30 days the whole trach is replaced with a new one.

## 2021-03-05 NOTE — Progress Notes (Signed)
Occupational Therapy Treatment Patient Details Name: Virginia [name] MRN: [id number] DOB: 10/18/1950 Today's Date: 03/05/2021   History of present illness Pt is a 70 y.o. 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 fib, fibromyalgia, HTN, osteoporosis, and Hep C brought to ED via EMS from home with hypoxia after a fall at home.   OT comments  Upon entering the room, pt supine in bed with no c/o pain and agreeable to OT intervention. RT also arrived and downsized trach, capped it, and placed pt on 3 L via [place]. Pt engaged in OT intervention with O2 saturation remaining in the 90's throughout. HR remained under 110 bpm during session. Bed mobility performed with supervision. Pt standing with min HHA for side stepping up and down along bed with min guard x 3 reps before returning to EOB to rest. Grooming tasks performed EOB with supervision. Pt then transferred to recliner chair to plug up razor and returned to bed with min guard. Pt declined to remains seated in recliner chair because it would "make my back hurt". Pt in bed with all needs within reach.    Recommendations for follow up therapy are one component of a multi-disciplinary discharge planning process, led by the attending physician.  Recommendations may be updated based on patient status, additional functional criteria and insurance authorization.    Follow Up Recommendations  SNF    Equipment Recommendations  None recommended by OT       Precautions / Restrictions Precautions Precautions: Fall Precaution Comments: trach Restrictions Weight Bearing Restrictions: No       Mobility Bed Mobility Overal bed mobility: Needs Assistance Bed Mobility: Supine to Sit;Sit to Supine     Supine to sit: Supervision Sit to supine: Min assist   General bed mobility comments: SPV and verbal cues for patient to move to EOB. MinA with management of LE back into bed and management of lines leads and tubes     Transfers Overall transfer level: Needs assistance Equipment used: 1 person hand held assist Transfers: Sit to/from Stand Sit to Stand: Min guard Stand pivot transfers: Min guard;Min assist       General transfer comment: Close SPV/SBA sit to stand    Balance Overall balance assessment: Needs assistance Sitting-balance support: Feet supported;Single extremity supported Sitting balance-Leahy Scale: Good Sitting balance - Comments: Pt able to sit EOB and complete therex with 1 UE support   Standing balance support: During functional activity Standing balance-Leahy Scale: Fair Standing balance comment: Pt maintained balance with close SBA during side stepping at EOB without AD                           ADL either performed or assessed with clinical judgement   ADL Overall ADL's : Needs assistance/impaired     Grooming: Sitting;Wash/dry face;Wash/dry hands                                       Vision Patient Visual Report: No change from baseline            Cognition Arousal/Alertness: Awake/alert Behavior During Therapy: WFL for tasks assessed/performed;Anxious Overall Cognitive Status: Within Functional Limits for tasks assessed  Exercises Other Exercises Other Exercises: Seated AROM all bilateral: Marching 2 x 10, LAQ 2 x 10, Heel Raises 2 x 10, Glute Sets 1 x 10, Hip ADD 2 x 10 with 3 second hold, Hip ABD 2 x 10 with PT providing light resistance throughout range. Supine therex bilat: SAQ 1 x 10. Pt educated on performing supine SAQ and ankle pumps to maintain LE strength while in hospital.           Pertinent Vitals/ Pain       Pain Assessment: No/denies pain   Frequency  Min 2X/week        Progress Toward Goals  OT Goals(current goals can now be found in the care plan section)  Progress towards OT goals: Progressing toward goals  Acute Rehab OT Goals Patient  Stated Goal: to return home OT Goal Formulation: With patient Time For Goal Achievement: 03/13/21  Plan Discharge plan remains appropriate;Frequency remains appropriate       AM-PAC OT "6 Clicks" Daily Activity     Outcome Measure   Help from another person eating meals?: None Help from another person taking care of personal grooming?: None Help from another person toileting, which includes using toliet, bedpan, or urinal?: A Little Help from another person bathing (including washing, rinsing, drying)?: A Little Help from another person to put on and taking off regular upper body clothing?: A Little Help from another person to put on and taking off regular lower body clothing?: A Little 6 Click Score: 20    End of Session Equipment Utilized During Treatment: Oxygen (3Ls)  OT Visit Diagnosis: Unsteadiness on feet (R26.81);Repeated falls (R29.6);Muscle weakness (generalized) (M62.81);History of falling (Z91.81)   Activity Tolerance Patient tolerated treatment well   Patient Left in bed;with call bell/phone within reach;with bed alarm set   Nurse Communication Mobility status        Time: 1340-1420 OT Time Calculation (min): 40 min  Charges: OT General Charges $OT Visit: 1 Visit OT Treatments $Self Care/Home Management : 8-22 mins $Therapeutic Activity: 23-37 mins  Jackquline Denmark, MS, OTR/L , CBIS ascom [phone]  03/05/21, 5:00 PM

## 2021-03-05 NOTE — Progress Notes (Signed)
Patient ringing out multiple times stating that she cannot breathe. She is alert and rocking back and forth in the bed with anxiety. Respirations are fluctuating between the 30s and 40s. BP is elevated and she is diaphoretic. She is refusing to be put on the ventilator as she did earlier in the shift and wants to remain on the trache collar. Respiratory and charge at the bedside. Audible expiratory wheezing noted. O2 sats 100%. Patient did reluctantly agree to be put on the vent and her sats remained the same and she is pulling her volumes. Lung sounds are diminished. Patient is engaging with the charge nurse and is beginning to calm down. Provider notified and awaiting response.

## 2021-03-06 DIAGNOSIS — J9621 Acute and chronic respiratory failure with hypoxia: Secondary | ICD-10-CM | POA: Diagnosis not present

## 2021-03-06 DIAGNOSIS — J9622 Acute and chronic respiratory failure with hypercapnia: Secondary | ICD-10-CM | POA: Diagnosis not present

## 2021-03-06 LAB — GLUCOSE, CAPILLARY
Glucose-Capillary: 176 mg/dL — ABNORMAL HIGH (ref 70–99)
Glucose-Capillary: 82 mg/dL (ref 70–99)
Glucose-Capillary: 98 mg/dL (ref 70–99)

## 2021-03-06 MED ORDER — VITAMIN B-12 100 MCG PO TABS
100.0000 ug | ORAL_TABLET | Freq: Every day | ORAL | Status: DC
  Administered 2021-03-06 – 2021-03-12 (×7): 100 ug via ORAL
  Filled 2021-03-06 (×8): qty 1

## 2021-03-06 MED ORDER — PREDNISONE 10 MG PO TABS
5.0000 mg | ORAL_TABLET | Freq: Every day | ORAL | Status: DC
  Administered 2021-03-06 – 2021-03-12 (×7): 5 mg via ORAL
  Filled 2021-03-06 (×7): qty 1

## 2021-03-06 MED ORDER — VITAMIN D (ERGOCALCIFEROL) 1.25 MG (50000 UNIT) PO CAPS
50000.0000 [IU] | ORAL_CAPSULE | ORAL | Status: DC
  Administered 2021-03-06: 50000 [IU] via ORAL
  Filled 2021-03-06: qty 1

## 2021-03-06 MED ORDER — ROSUVASTATIN CALCIUM 10 MG PO TABS
10.0000 mg | ORAL_TABLET | Freq: Every day | ORAL | Status: DC
  Administered 2021-03-06 – 2021-03-11 (×6): 10 mg via ORAL
  Filled 2021-03-06 (×6): qty 1

## 2021-03-06 MED ORDER — GABAPENTIN 100 MG PO CAPS
200.0000 mg | ORAL_CAPSULE | Freq: Three times a day (TID) | ORAL | Status: DC
  Administered 2021-03-06 – 2021-03-12 (×19): 200 mg via ORAL
  Filled 2021-03-06 (×20): qty 2

## 2021-03-06 MED ORDER — QUETIAPINE FUMARATE 25 MG PO TABS
25.0000 mg | ORAL_TABLET | Freq: Every evening | ORAL | Status: DC | PRN
  Administered 2021-03-09 – 2021-03-11 (×3): 25 mg
  Filled 2021-03-06 (×3): qty 1

## 2021-03-06 MED ORDER — DILTIAZEM HCL ER COATED BEADS 120 MG PO CP24
120.0000 mg | ORAL_CAPSULE | Freq: Every day | ORAL | Status: DC
  Administered 2021-03-06 – 2021-03-12 (×7): 120 mg via ORAL
  Filled 2021-03-06 (×7): qty 1

## 2021-03-06 NOTE — TOC Progression Note (Signed)
Transition of Care [facility]) - Progression Note    Patient Details  Name: Virginia [name] MRN: [id number] Date of Birth: 08-Feb-1951  Transition of Care [facility] CM/SW Contact  Hetty Ely, RN Phone Number: 03/06/2021, 9:14 AM  Clinical Narrative:  Spoke with daughter, Erick Alley discussed SNF placement difficulty and increasing the search to include [facility] and surrounding areas. Daughter in law agrees and says she will also call Insurance for more information/guidance on placement.     Expected Discharge Plan: Home w Home Health Services Barriers to Discharge: Continued Medical Work up  Expected Discharge Plan and Services Expected Discharge Plan: Home w Home Health Services In-house Referral: Clinical Social Work   Post Acute Care Choice: Durable Medical Equipment Living arrangements for the past 2 months: Apartment Expected Discharge Date: 02/23/21                                     Social Determinants of Health (SDOH) Interventions    Readmission Risk Interventions No flowsheet data found.

## 2021-03-06 NOTE — Progress Notes (Signed)
PT Cancellation Note  Patient Details Name: Virginia [name] MRN: [id number] DOB: January 09, 1951   Cancelled Treatment:    Reason Eval/Treat Not Completed: Pain limiting ability to participate Entered patient's room and offered to assist patient with combing hair at EOB/repositioning in bed. Pt declined due to low back pain and she will be receiving pain medication soon. Discussed with patient a more preferred time for therapy/management of pain and reports 1-2pm works best. Will re-attempt tomorrow.   Verl Blalock, SPT  Verl Blalock 03/06/2021, 4:48 PM

## 2021-03-06 NOTE — Progress Notes (Signed)
NAME:  Virginia [name], MRN:  [id number], DOB:  [birth year], LOS: 16 ADMISSION DATE:  02/18/2021, CONSULTATION DATE:  02/18/21 REFERRING MD:  Dr. 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   Per son, Virginia [name], patient lives at home alone and is assisted daily by himself and his wife as well as a home health nurse who comes out 3 times a week.  Virginia [name] reports it is he and his wife who help put her on her trilogy machine nightly.  The patient normally uses the trilogy as a CPAP mask with the tracheostomy capped overnight and is on 4.5 to 5 L nasal cannula during the day.   The patient's son reported that he and wife came down with COVID-19 last Wednesday, 02/14/2021, and have not been available to help the patient use this machine at night since 02/13/21, and so she has gone without it. Virginia [name] stated that his mother and her home health nurse reported to him that she has had multiple falls. He stated that yesterday, on 02/17/21, she fell in the bathroom and was stranded on the floor from 3 am until the home helper arrived hours later. The son, daughter-in-law and home health were concerned about the patient's respiratory status, specifically possible CO2 retention, and EMS was called. The first 2 times the patient refused to go with EMS. The third time however the patient's SpO2 was reading in the 50's with a good waveform, she was placed on CPAP and brought to the ED for evaluation.   PCCM consulted to admit to ICU as patient is requiring mechanical ventilation support.  The patient's mentation has improved but PCO2 after 1 hour is 95.  02/27/21- patient with less anxiety today.  She is smiling during my examination. She had CM consultation today and supplies should be delivered by this Friday with plan to dc home post NIV  optimization.  Trache site without exudation , vitals improving.  02/28/21- patient awaiting trache outflow valve for trilogy NIV to allow expiratory flow on Trilogy device so she can breathe at home.   03/01/21- patient slept better with alprazolam, She is awaiting supplies for ventilator at home to be used with trache. Family education session scheduled. PT and OT on going to treat severe physical deconditioning.   03/02/21- patient had some confusion with family members and they cant have home ventilator due to inability to understand function of NIV nor how to properly use equipment  03/03/21- patient with no overnight events. Anxiety level is improved  9/12 trach changed to #6 CUFFLESS, tolerated well 9/13 started AVAPS BIPAP target TV 450 Rate 12  Pertinent  Medical History  Atrial Fibrillation on Eliquis C. Difficile Severe COPD Chronic tracheostomy Anxiety & depression Hepatitis C Arthritis HTN Fibromyalgia Anemia CAD Thoracic AAA Significant Hospital Events: Including procedures, antibiotic start and stop dates in addition to other pertinent events   02/18/21: Admit to ICU with acute on chronic combined hypoxic/hypercapnic respiratory failure with chronic tracheostomy requiring mechanical ventilation 8/29 severe copd exacerbation 8/30 did NOT tolerate PS mode, plan for PS mode today 8/31 failed weaning trials, CT ABD  8/30 CT ABD/PELVIS Possible transition point in the central pelvis suggesting partial or developing bowel obstruction over ileus. Distal small bowel and colon are relatively collapsed-casuing failure to wean from vent 9/1 less distended on vent, alert and  awake 9/2 wanting to go home, no complaints, feels she is at baseline 9/3 unable to go home yesterday due to need to have vent connected to trach (patient has been using CPAP mask), episodes of triggering the vent on pressure support last night, she will need a rate nocturnally 9/4 slept well on mechanical  ventilation nocturnally 02/26/21- patient is lucid , sheson HF trache 14L/min.  She is tachycardic and reports worse chest discomfort,concern for PE will have ct angio today, she requests xanax for labored breathing and anxiety  Interim History / Subjective:  AVAPS BIPAP at night TCT during the day No distress COPD exacerbation due to Enterobacter aerogenes bronchitis and hypoxia/hypercarbia     Objective   Blood pressure 130/88, pulse 77, temperature (!) 97.2 F (36.2 C), temperature source Axillary, resp. rate 20, height 5' (1.524 m), weight 51.6 kg, SpO2 98 %.    FiO2 (%):  [35 %] 35 %   Intake/Output Summary (Last 24 hours) at 03/06/2021 0742 Last data filed at 03/06/2021 0600 Gross per 24 hour  Intake 120 ml  Output 650 ml  Net -530 ml    Filed Weights   03/02/21 0500 03/04/21 0500 03/05/21 0425  Weight: 55.1 kg 54.1 kg 51.6 kg    Review of Systems: Alert and awake +resp distress Other:  All other systems negative  Physical Examination:   General Appearance: No distress s/p trach EYES PERRLA, EOM intact.   NECK Supple, No JVD Pulmonary: normal breath sounds, No wheezing.  CardiovascularNormal S1,S2.  No m/r/g.   Abdomen: Benign, Soft, non-tender. Skin:   warm, no rashes, no ecchymosis  Extremities: normal, no cyanosis, clubbing. Neuro:without focal findings,  speech normal  PSYCHIATRIC: Mood, affect within normal limits.   ALL OTHER ROS ARE NEGATIVE    Assessment & Plan:  70 year old former smoker, with acute on chronic hypoxic/hypercapnic respiratory failure due to COPD exacerbation with Enterobacter aerogenes bronchitis.  She has chronic ventilatory dependence at home.  She was using Trilogy vent via mask rather than via trach.  She has done well here with ventilatory support via trach.  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 DVT prophylaxis: LMWH GI prophylaxis: PPI Lines: N/A Foley:  N/A Code Status:  full code   Labs   CBC: Recent Labs  Lab 03/03/21 0539 03/05/21 0432  WBC 9.0 9.6  NEUTROABS 6.6 7.5  HGB 8.6* 9.7*  HCT 27.9* 30.5*  MCV 97.6 97.1  PLT 423* 454*     Basic Metabolic Panel: Recent Labs  Lab 03/01/21 1053 03/03/21 0539 03/05/21 0432  NA  --  137 137  K 4.4 3.8 4.1  CL  --  97* 95*  CO2  --  35* 33*  GLUCOSE  --  89 99  BUN  --  17 16  CREATININE  --  0.58 0.64  CALCIUM  --  8.8* 9.1  MG 2.1 2.0 2.0  PHOS  --  4.2 4.1    GFR: Estimated Creatinine Clearance: 47 mL/min (by C-G formula based on SCr of 0.64  mg/dL). Recent Labs  Lab 03/03/21 0539 03/05/21 0432  WBC 9.0 9.6    DISPOSITION SNF/REHAB   Lucie Leather, M.D.  [facility]  Medical Director [facility] Medical Director [facility] Cardio-Pulmonary Department

## 2021-03-07 DIAGNOSIS — J9622 Acute and chronic respiratory failure with hypercapnia: Secondary | ICD-10-CM | POA: Diagnosis not present

## 2021-03-07 DIAGNOSIS — J9621 Acute and chronic respiratory failure with hypoxia: Secondary | ICD-10-CM | POA: Diagnosis not present

## 2021-03-07 LAB — CULTURE, RESPIRATORY W GRAM STAIN: Gram Stain: NONE SEEN

## 2021-03-07 LAB — GLUCOSE, CAPILLARY: Glucose-Capillary: 111 mg/dL — ABNORMAL HIGH (ref 70–99)

## 2021-03-07 NOTE — Progress Notes (Signed)
Physical Therapy Treatment Patient Details Name: Virginia [name] MRN: [id number] DOB: 08-Sep-1950 Today's Date: 03/07/2021   History of Present Illness Pt is a 70 y.o. 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 fib, fibromyalgia, HTN, osteoporosis, and Hep C brought to ED via EMS from home with hypoxia after a fall at home.    PT Comments    Pt continuing to make progress towards therapy goals. Pt was able to ambulate in room today x 3  with CGA/intermittent SPV with seated rest breaks. Pt on 6L initially with exertion and dropped to 85% SpO2. Increased oxygen to 8L with exertion and maintained >90% throughout. Based on patient progress, recommending change in discharge disposition to home with home health PT and home oxygen. Secure chat message sent to care team with updated recommendation. Pt will continue to benefit from skilled PT services during admission to improve activity tolerance and increase independence.   Recommendations for follow up therapy are one component of a multi-disciplinary discharge planning process, led by the attending physician.  Recommendations may be updated based on patient status, additional functional criteria and insurance authorization.  Follow Up Recommendations  Home health PT;Supervision - Intermittent     Equipment Recommendations  None recommended by PT    Recommendations for Other Services       Precautions / Restrictions Precautions Precautions: Fall Restrictions Weight Bearing Restrictions: No     Mobility  Bed Mobility Overal bed mobility: Independent Bed Mobility: Supine to Sit;Sit to Supine     Supine to sit: Independent Sit to supine: Independent   General bed mobility comments: Only requires management of lines and leads    Transfers Overall transfer level: Needs assistance Equipment used: Rolling walker (2 wheeled) Transfers: Sit to/from Stand Sit to Stand: Min guard;Supervision         General  transfer comment: CGA/SPV for sit to stand from EOB. Cues for proper hand placement to assist with pushing off chair/bed surface rather than on RW  Ambulation/Gait Ambulation/Gait assistance: Min guard Gait Distance (Feet): 30 Feet x 3  Assistive device: Rolling walker (2 wheeled) Gait Pattern/deviations: Step-through pattern;Decreased stride length;Trunk flexed Gait velocity: Slow   General Gait Details: Requiring CGA/ intermittent SPV for safety during ambulation with RW. Pt requiring rest breaks between each bout of ambulation for approximately 2 minutes each. Pt on 6L of oxygen and dropped to 85% SpO2 during first bout and increased to 8L of oxygen during last bout and remained >90% SpO2.   Stairs             Wheelchair Mobility    Modified Rankin (Stroke Patients Only)       Balance Overall balance assessment: Independent     Sitting balance - Comments: Good seated balance without UE assistance at EOB   Standing balance support: During functional activity Standing balance-Leahy Scale: Good Standing balance comment: Requires usage of RW for B UE support. Able to take 2 side steps without AD and no UE support.           Cognition Arousal/Alertness: Awake/alert Behavior During Therapy: WFL for tasks assessed/performed;Anxious Overall Cognitive Status: Within Functional Limits for tasks assessed      Exercises      General Comments        Pertinent Vitals/Pain Pain Assessment: Faces Faces Pain Scale: Hurts a little bit Pain Location: Low Back Pain Intervention(s): Limited activity within patient's tolerance;Monitored during session;Premedicated before session    Home Living  Prior Function            PT Goals (current goals can now be found in the care plan section) Acute Rehab PT Goals Patient Stated Goal: To go home PT Goal Formulation: With patient Time For Goal Achievement: 03/09/21 Potential to Achieve Goals: Good Progress  towards PT goals: Progressing toward goals    Frequency    Min 2X/week      PT Plan Discharge plan needs to be updated    Co-evaluation              AM-PAC PT "6 Clicks" Mobility   Outcome Measure  Help needed turning from your back to your side while in a flat bed without using bedrails?: None Help needed moving from lying on your back to sitting on the side of a flat bed without using bedrails?: None Help needed moving to and from a bed to a chair (including a wheelchair)?: A Little Help needed standing up from a chair using your arms (e.g., wheelchair or bedside chair)?: A Little Help needed to walk in hospital room?: A Little Help needed climbing 3-5 steps with a railing? : A Lot 6 Click Score: 19    End of Session Equipment Utilized During Treatment: Gait belt;Oxygen Activity Tolerance: Patient tolerated treatment well Patient left: in bed;with call bell/phone within reach;with bed alarm set;with SCD's reapplied Nurse Communication: Mobility status PT Visit Diagnosis: Other abnormalities of gait and mobility (R26.89)     Time: 7412-8786 PT Time Calculation (min) (ACUTE ONLY): 31 min  Charges:  $Gait Training: 23-37 mins                     Verl Blalock, SPT    Verl Blalock 03/07/2021, 4:15 PM

## 2021-03-07 NOTE — Progress Notes (Signed)
Nutrition Follow-up  DOCUMENTATION CODES:   Non-severe (moderate) malnutrition in context of acute illness/injury  INTERVENTION:   Ensure Enlive po TID, each supplement provides 350 kcal and 20 grams of protein  Magic cup TID with meals, each supplement provides 290 kcal and 9 grams of protein  MVI po daily   NUTRITION DIAGNOSIS:   Moderate Malnutrition related to acute illness as evidenced by mild fat depletion, mild muscle depletion.  GOAL:   Patient will meet greater than or equal to 90% of their needs -not met   MONITOR:   PO intake, Supplement acceptance, Vent status, Labs, Weight trends, Skin, I & O's  ASSESSMENT:   70 y.o. 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 fib, fibromyalgia, HTN, osteoporosis, and Hep C brought to ED via EMS from home with hypoxia after a fall at home.  -Pt s/p cuffless trach exchange 9/12  Pt continues to have decreased appetite and oral intake; pt eating 25% of most meals but is drinking her Ensure supplements. Pt also receiving Magic Cups on meal trays. Refeed labs stable. Per chart, pt has remained fairly weight stable since admission. Pt currently pending SNF placement.   Medications reviewed and include: MVI, protonix, prednisone, B12, vitamin D  Labs reviewed: K 4.1 wnl, P 4.1 wnl, Mg 2.0 wnl Hgb 9.7(L), Hct 30.5(L)  Diet Order:   Diet Order             DIET DYS 3 Room service appropriate? Yes with Assist; Fluid consistency: Thin  Diet effective now                  EDUCATION NEEDS:   No education needs have been identified at this time  Skin:  Skin Assessment: Reviewed RN Assessment  Last BM:  9/13- type 6  Height:   Ht Readings from Last 1 Encounters:  02/28/21 5' (1.524 m)    Weight:   Wt Readings from Last 1 Encounters:  03/07/21 51.9 kg    Ideal Body Weight:  45.5 kg  BMI:  Body mass index is 22.35 kg/m.  Estimated Nutritional Needs:   Kcal:  1400-1600kcal/day  Protein:   70-80g/day  Fluid:  1.3-1.5L/day  Koleen Distance MS, RD, LDN Please refer to [facility] for RD and/or RD on-call/weekend/after hours pager

## 2021-03-07 NOTE — Progress Notes (Signed)
NAME:  Virginia [name], MRN:  [id number], DOB:  Dec 01, 1950, LOS: 17 ADMISSION DATE:  02/18/2021, CONSULTATION DATE:  02/18/21 REFERRING MD:  Dr. 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   Per son, Virginia [name], patient lives at home alone and is assisted daily by himself and his wife as well as a home health nurse who comes out 3 times a week.  Virginia [name] reports it is he and his wife who help put her on her trilogy machine nightly.  The patient normally uses the trilogy as a CPAP mask with the tracheostomy capped overnight and is on 4.5 to 5 L nasal cannula during the day.   The patient's son reported that he and wife came down with COVID-19 last Wednesday, 02/14/2021, and have not been available to help the patient use this machine at night since 02/13/21, and so she has gone without it. Virginia [name] stated that his mother and her home health nurse reported to him that she has had multiple falls. He stated that yesterday, on 02/17/21, she fell in the bathroom and was stranded on the floor from 3 am until the home helper arrived hours later. The son, daughter-in-law and home health were concerned about the patient's respiratory status, specifically possible CO2 retention, and EMS was called. The first 2 times the patient refused to go with EMS. The third time however the patient's SpO2 was reading in the 50's with a good waveform, she was placed on CPAP and brought to the ED for evaluation.   PCCM consulted to admit to ICU as patient is requiring mechanical ventilation support.  The patient's mentation has improved but PCO2 after 1 hour is 95.  02/27/21- patient with less anxiety today.  She is smiling during my examination. She had CM consultation today and supplies should be delivered by this Friday with plan to dc home post NIV  optimization.  Trache site without exudation , vitals improving.  02/28/21- patient awaiting trache outflow valve for trilogy NIV to allow expiratory flow on Trilogy device so she can breathe at home.   03/01/21- patient slept better with alprazolam, She is awaiting supplies for ventilator at home to be used with trache. Family education session scheduled. PT and OT on going to treat severe physical deconditioning.   03/02/21- patient had some confusion with family members and they cant have home ventilator due to inability to understand function of NIV nor how to properly use equipment  03/03/21- patient with no overnight events. Anxiety level is improved  9/12 trach changed to #6 CUFFLESS, tolerated well 9/13 started AVAPS BIPAP target TV 450 Rate 12 9/14 started AVAPS BIPAP target TV 450 Rate 12  Pertinent  Medical History  Atrial Fibrillation on Eliquis C. Difficile Severe COPD Chronic tracheostomy Anxiety & depression Hepatitis C Arthritis HTN Fibromyalgia Anemia CAD Thoracic AAA Significant Hospital Events: Including procedures, antibiotic start and stop dates in addition to other pertinent events   02/18/21: Admit to ICU with acute on chronic combined hypoxic/hypercapnic respiratory failure with chronic tracheostomy requiring mechanical ventilation 8/29 severe copd exacerbation 8/30 did NOT tolerate PS mode, plan for PS mode today 8/31 failed weaning trials, CT ABD  8/30 CT ABD/PELVIS Possible transition point in the central pelvis suggesting partial or developing bowel obstruction over ileus. Distal small bowel and colon are relatively collapsed-casuing failure to wean  from vent 9/1 less distended on vent, alert and awake 9/2 wanting to go home, no complaints, feels she is at baseline 9/3 unable to go home yesterday due to need to have vent connected to trach (patient has been using CPAP mask), episodes of triggering the vent on pressure support last night, she will need a rate  nocturnally 9/4 slept well on mechanical ventilation nocturnally 02/26/21- patient is lucid , sheson HF trache 14L/min.  She is tachycardic and reports worse chest discomfort,concern for PE will have ct angio today, she requests xanax for labored breathing and anxiety  Interim History / Subjective:  TCT during he day AVAPS/BIPAP at NIGHT NO DISTRESS COPD exacerbation due to Enterobacter aerogenes bronchitis and hypoxia/hypercarbia-completed ABX      Objective   Blood pressure 107/77, pulse 69, temperature 98.3 F (36.8 C), resp. rate (!) 22, height 5' (1.524 m), weight 51.9 kg, SpO2 100 %.        Intake/Output Summary (Last 24 hours) at 03/07/2021 0725 Last data filed at 03/07/2021 0700 Gross per 24 hour  Intake 120 ml  Output 550 ml  Net -430 ml    Filed Weights   03/04/21 0500 03/05/21 0425 03/07/21 0406  Weight: 54.1 kg 51.6 kg 51.9 kg    Review of Systems: Alert and awake +distress Other:  All other systems negative  Physical Examination:   General Appearance: No distress  EYES PERRLA, EOM intact.   NECK Supple, No JVD s/p [facility] Pulmonary: +wheezing.  CardiovascularNormal S1,S2.  No m/r/g.   Abdomen: Benign, Soft, non-tender.   ALL OTHER ROS ARE NEGATIVE     Assessment & Plan:  70 year old former smoker, with acute on chronic hypoxic/hypercapnic respiratory failure due to COPD exacerbation with Enterobacter aerogenes bronchitis.  She has chronic ventilatory dependence at home.  She was using Trilogy vent via mask rather than via trach.  She has done well here with ventilatory support via trach. 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 DVT prophylaxis: LMWH GI prophylaxis: PPI Lines: N/A Foley:  N/A Code Status:  full code   Labs   CBC: Recent Labs  Lab 03/03/21 0539 03/05/21 0432  WBC 9.0 9.6  NEUTROABS 6.6 7.5  HGB 8.6* 9.7*  HCT 27.9* 30.5*  MCV 97.6 97.1  PLT 423* 454*     Basic Metabolic Panel: Recent Labs  Lab 03/01/21 1053 03/03/21 0539 03/05/21 0432  NA  --  137 137  K 4.4 3.8 4.1  CL  --  97* 95*  CO2  --  35* 33*  GLUCOSE  --  89 99  BUN  --  17 16  CREATININE  --  0.58 0.64  CALCIUM  --  8.8* 9.1  MG 2.1 2.0 2.0  PHOS  --  4.2 4.1    GFR: Estimated Creatinine Clearance: 47 mL/min (by C-G formula based on SCr of 0.64 mg/dL). Recent Labs  Lab 03/03/21 0539 03/05/21 0432  WBC 9.0 9.6    DISPOSITION SNF/REHAB   Lucie Leather, M.D.  [facility] Pulmonary &  Critical Care Medicine  Medical Director [facility] Medical Director [facility] Cardio-Pulmonary Department

## 2021-03-07 NOTE — Progress Notes (Signed)
Follow up visit with patient who was awake and watching tv. Our connection is talking about familiar television shows to each of [place]. Patient states she is feeling better. Will continue to check in with her.

## 2021-03-07 NOTE — Progress Notes (Signed)
OT Cancellation Note  Patient Details Name: Virginia [name] MRN: [id number] DOB: August 01, 1950   Cancelled Treatment:    Reason Eval/Treat Not Completed: Other (comment). Pt currently working with PT. OT will re-attempt when pt is next available.   Jackquline Denmark, MS, OTR/L , CBIS ascom [phone]  03/07/21, 2:23 PM

## 2021-03-08 DIAGNOSIS — J9621 Acute and chronic respiratory failure with hypoxia: Secondary | ICD-10-CM | POA: Diagnosis not present

## 2021-03-08 DIAGNOSIS — J9622 Acute and chronic respiratory failure with hypercapnia: Secondary | ICD-10-CM | POA: Diagnosis not present

## 2021-03-08 LAB — CBC
HCT: 27.4 % — ABNORMAL LOW (ref 36.0–46.0)
Hemoglobin: 8.4 g/dL — ABNORMAL LOW (ref 12.0–15.0)
MCH: 30.1 pg (ref 26.0–34.0)
MCHC: 30.7 g/dL (ref 30.0–36.0)
MCV: 98.2 fL (ref 80.0–100.0)
Platelets: 330 10*3/uL (ref 150–400)
RBC: 2.79 MIL/uL — ABNORMAL LOW (ref 3.87–5.11)
RDW: 15.2 % (ref 11.5–15.5)
WBC: 7.6 10*3/uL (ref 4.0–10.5)
nRBC: 0 % (ref 0.0–0.2)

## 2021-03-08 NOTE — Progress Notes (Signed)
Occupational Therapy Treatment Patient Details Name: Virginia [name] MRN: [id number] DOB: 06-Dec-1950 Today's Date: 03/08/2021   History of present illness Pt is a 70 y.o. 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 fib, fibromyalgia, HTN, osteoporosis, and Hep C brought to ED via EMS from home with hypoxia after a fall at home.   OT comments  Upon entering the room, pt supine in bed with no c/o pain this session. Pt reports feeling very down and missing her home and dog. Pt requesting assistance with hair washing. Bed mobility performed with increased time and no physical assistance. Pt seated on EOB for 20 minutes and assisting therapist with combing hair and drying hair this session. O2 saturation remained above 90% this session while on RA. HR remained WNLs. Pt returning to bed at end of session and thanks therapist for coming to see her and encouragement. Pt's discharge recommendation upgraded based on pt's progress with therapy.   Recommendations for follow up therapy are one component of a multi-disciplinary discharge planning process, led by the attending physician.  Recommendations may be updated based on patient status, additional functional criteria and insurance authorization.    Follow Up Recommendations  Home health OT;Supervision/Assistance - 24 hour    Equipment Recommendations  None recommended by OT       Precautions / Restrictions Precautions Precautions: Fall       Mobility Bed Mobility Overal bed mobility: Independent Bed Mobility: Supine to Sit;Sit to Supine     Supine to sit: Independent Sit to supine: Independent   General bed mobility comments: Only requires management of lines and leads                   Balance Overall balance assessment: Modified Independent Sitting-balance support: Feet supported;Single extremity supported Sitting balance-Leahy Scale: Good Sitting balance - Comments: Good seated balance without UE assistance  at EOB                                   ADL either performed or assessed with clinical judgement                                                  Vision Patient Visual Report: No change from baseline            Cognition Arousal/Alertness: Awake/alert   Overall Cognitive Status: Within Functional Limits for tasks assessed                                                            Pertinent Vitals/ Pain       Pain Assessment: No/denies pain   Frequency  Min 2X/week        Progress Toward Goals  OT Goals(current goals can now be found in the care plan section)  Progress towards OT goals: Progressing toward goals  Acute Rehab OT Goals Patient Stated Goal: To go home OT Goal Formulation: With patient Time For Goal Achievement: 03/13/21 Potential to Achieve Goals: Good  Plan Frequency remains appropriate;Discharge plan needs to be updated  AM-PAC OT "6 Clicks" Daily Activity     Outcome Measure   Help from another person eating meals?: None Help from another person taking care of personal grooming?: None Help from another person toileting, which includes using toliet, bedpan, or urinal?: A Little Help from another person bathing (including washing, rinsing, drying)?: A Little Help from another person to put on and taking off regular upper body clothing?: A Little Help from another person to put on and taking off regular lower body clothing?: A Little 6 Click Score: 20    End of Session Equipment Utilized During Treatment: Oxygen (4Ls)  OT Visit Diagnosis: Unsteadiness on feet (R26.81);Repeated falls (R29.6);Muscle weakness (generalized) (M62.81);History of falling (Z91.81)   Activity Tolerance Patient tolerated treatment well   Patient Left in bed;with call bell/phone within reach;with bed alarm set   Nurse Communication Mobility status        Time: 1510-1534 OT Time Calculation (min):  24 min  Charges: OT General Charges $OT Visit: 1 Visit OT Treatments $Self Care/Home Management : 23-37 mins  Jackquline Denmark, MS, OTR/L , CBIS ascom [phone]  03/08/21, 4:21 PM

## 2021-03-08 NOTE — Progress Notes (Signed)
NAME:  Virginia [name], MRN:  [id number], DOB:  [birth year], LOS: 18 ADMISSION DATE:  02/18/2021, CONSULTATION DATE:  02/18/21 REFERRING MD:  Dr. 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   Per son, Virginia [name], patient lives at home alone and is assisted daily by himself and his wife as well as a home health nurse who comes out 3 times a week.  [name] reports it is he and his wife who help put her on her trilogy machine nightly.  The patient normally uses the trilogy as a CPAP mask with the tracheostomy capped overnight and is on 4.5 to 5 L nasal cannula during the day.   The patient's son reported that he and wife came down with COVID-19 last Wednesday, 02/14/2021, and have not been available to help the patient use this machine at night since 02/13/21, and so she has gone without it. Virginia [name] stated that his mother and her home health nurse reported to him that she has had multiple falls. He stated that yesterday, on 02/17/21, she fell in the bathroom and was stranded on the floor from 3 am until the home helper arrived hours later. The son, daughter-in-law and home health were concerned about the patient's respiratory status, specifically possible CO2 retention, and EMS was called. The first 2 times the patient refused to go with EMS. The third time however the patient's SpO2 was reading in the 50's with a good waveform, she was placed on CPAP and brought to the ED for evaluation.   PCCM consulted to admit to ICU as patient is requiring mechanical ventilation support.  The patient's mentation has improved but PCO2 after 1 hour is 95.  02/27/21- patient with less anxiety today.  She is smiling during my examination. She had CM consultation today and supplies should be delivered by this Friday with plan to dc home post NIV  optimization.  Trache site without exudation , vitals improving.  02/28/21- patient awaiting trache outflow valve for trilogy NIV to allow expiratory flow on Trilogy device so she can breathe at home.   03/01/21- patient slept better with alprazolam, She is awaiting supplies for ventilator at home to be used with trache. Family education session scheduled. PT and OT on going to treat severe physical deconditioning.   03/02/21- patient had some confusion with family members and they cant have home ventilator due to inability to understand function of NIV nor how to properly use equipment  03/03/21- patient with no overnight events. Anxiety level is improved  9/12 trach changed to #6 CUFFLESS, tolerated well 9/13 started AVAPS BIPAP target TV 450 Rate 12 9/14 started AVAPS BIPAP target TV 450 Rate 12  Pertinent  Medical History  Atrial Fibrillation on Eliquis C. Difficile Severe COPD Chronic tracheostomy Anxiety & depression Hepatitis C Arthritis HTN Fibromyalgia Anemia CAD Thoracic AAA Significant Hospital Events: Including procedures, antibiotic start and stop dates in addition to other pertinent events   02/18/21: Admit to ICU with acute on chronic combined hypoxic/hypercapnic respiratory failure with chronic tracheostomy requiring mechanical ventilation 8/29 severe copd exacerbation 8/30 did NOT tolerate PS mode, plan for PS mode today 8/31 failed weaning trials, CT ABD  8/30 CT ABD/PELVIS Possible transition point in the central pelvis suggesting partial or developing bowel obstruction over ileus. Distal small bowel and colon are relatively collapsed-casuing failure to wean  from vent 9/1 less distended on vent, alert and awake 9/2 wanting to go home, no complaints, feels she is at baseline 9/3 unable to go home yesterday due to need to have vent connected to trach (patient has been using CPAP mask), episodes of triggering the vent on pressure support last night, she will need a rate  nocturnally 9/4 slept well on mechanical ventilation nocturnally 02/26/21 patient is lucid , sheson HF trache 14L/min.  She is tachycardic and reports worse chest discomfort,concern for PE will have ct angio today, she requests xanax for labored breathing and anxiety 9/15 tolerating AVAPS qhs; still awaiting discharge disposition   Interim History / Subjective:  TCT during he day AVAPS/BIPAP at NIGHT NO DISTRESS COPD exacerbation due to Enterobacter aerogenes bronchitis and hypoxia/hypercarbia-completed ABX  Objective   Blood pressure 135/89, pulse 96, temperature 99.1 F (37.3 C), resp. rate 18, height 5' (1.524 m), weight 51.9 kg, SpO2 96 %.        Intake/Output Summary (Last 24 hours) at 03/08/2021 1055 Last data filed at 03/08/2021 0600 Gross per 24 hour  Intake --  Output 600 ml  Net -600 ml   Filed Weights   03/05/21 0425 03/07/21 0406 03/08/21 0421  Weight: 51.6 kg 51.9 kg 51.9 kg    Review of Systems: Alert and awake +distress Other:  All other systems negative  Physical Examination:   General Appearance: Well developed resting in bed, NAD  EYES:  PERRLA, EOM intact. NEURO: Alert and oriented, follows commands  NECK: Supple, No JVD midline tracheostomy clean/dry intact  Pulmonary: Diminished throughout, even, non labored  Cardiovascular: Normal S1,S2.  No M/R/G, 2+ radial/2+ distal pulses present    Abdomen: Benign, soft, non-tender, non tender    ALL OTHER ROS ARE NEGATIVE  Assessment & Plan:  70 year old former smoker, with acute on chronic hypoxic/hypercapnic respiratory failure due to COPD exacerbation with Enterobacter aerogenes bronchitis.  She has chronic ventilatory dependence at home.  She was using Trilogy vent via mask rather than via trach.  She has done well here with ventilatory support via trach. 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  However, pts family concerned about pt staying at home by herself.  TOC working on possible SNF placement although so far the pt has been denied placement locally.  TOC increasing SNF search to include [facility] and surrounding areas.  Best Practice (right click and "Reselect all SmartList Selections" daily)  Diet/type: Tolerating p.o. DVT prophylaxis: LMWH GI prophylaxis: PPI Lines: N/A Foley:  N/A Code Status:  full code   Labs   CBC: Recent Labs  Lab 03/03/21 0539 03/05/21 0432 03/08/21 0301  WBC 9.0 9.6 7.6  NEUTROABS 6.6 7.5  --   HGB 8.6* 9.7* 8.4*  HCT 27.9* 30.5* 27.4*  MCV 97.6 97.1 98.2  PLT 423* 454* 330    Basic Metabolic Panel: Recent Labs  Lab 03/03/21 0539 03/05/21 0432  NA 137 137  K 3.8 4.1  CL 97* 95*  CO2 35* 33*  GLUCOSE 89 99  BUN 17 16  CREATININE 0.58 0.64  CALCIUM 8.8* 9.1  MG 2.0 2.0  PHOS 4.2 4.1   GFR: Estimated Creatinine Clearance: 47 mL/min (by C-G formula based on SCr of 0.64 mg/dL). Recent Labs  Lab 03/03/21 0539 03/05/21 0432 03/08/21 0301  WBC 9.0 9.6 7.6   DISPOSITION SNF/REHAB  Critical Care Time: 35 minutes   Camie Patience, AGNP  Pulmonary/Critical Care Pager [phone] (please enter 7 digits) PCCM Consult Pager [phone] (please  enter 7 digits)

## 2021-03-08 NOTE — Progress Notes (Signed)
Educated pt.'s daughter on how to change the inner cannula. She understood and is comfortable with changing the cannula.

## 2021-03-08 NOTE — TOC Progression Note (Signed)
Transition of Care [facility]) - Progression Note    Patient Details  Name: Virginia [name] MRN: [id number] Date of Birth: [birth year]  Transition of Care [facility]) CM/SW Contact  Joseph Art, [place] Phone Number: 03/08/2021, 4:52 PM  Clinical Narrative:     CSW spoke with patient's daughter in law [name] [phone], w/ update on PT recommendations for Home Health.  Ms. Dan Humphreys stated they were in the process of obtaining a personal care services aide and for the time being the patient would not have the extra supervision they feel she needs.  CSW verbalized understanding and asked Ms. Walker if she has a time line for [employer] aide would be about a week to week and a half, but there was not definitive timeline for the [facility] aide to start. CSW told Ms. Walker the patient was walking 30 feet with no assist but supervision and the patient was able to use a bedside commode. Ms. Dan Humphreys stated she and her spouse would be out of town until Sunday and would not be at the house to care for the patient in the evening.  CSW stated I would contact Ms. Walker Monday morning to discuss discharge plan for the patient to return home with current PCS aide and family supports.  Ms. Dan Humphreys stated she would like to speak with the Attending first about d/c plan.  CSW verbalized understanding.  Expected Discharge Plan: Home w Home Health Services Barriers to Discharge: Continued Medical Work up  Expected Discharge Plan and Services Expected Discharge Plan: Home w Home Health Services In-house Referral: Clinical Social Work   Post Acute Care Choice: Durable Medical Equipment Living arrangements for the past 2 months: Apartment Expected Discharge Date: 02/23/21                                     Social Determinants of Health (SDOH) Interventions    Readmission Risk Interventions No flowsheet data found.

## 2021-03-09 DIAGNOSIS — J9622 Acute and chronic respiratory failure with hypercapnia: Secondary | ICD-10-CM | POA: Diagnosis not present

## 2021-03-09 DIAGNOSIS — J9621 Acute and chronic respiratory failure with hypoxia: Secondary | ICD-10-CM | POA: Diagnosis not present

## 2021-03-09 LAB — CBC WITH DIFFERENTIAL/PLATELET
Abs Immature Granulocytes: 0.03 10*3/uL (ref 0.00–0.07)
Basophils Absolute: 0 10*3/uL (ref 0.0–0.1)
Basophils Relative: 1 %
Eosinophils Absolute: 0.1 10*3/uL (ref 0.0–0.5)
Eosinophils Relative: 2 %
HCT: 27.8 % — ABNORMAL LOW (ref 36.0–46.0)
Hemoglobin: 8.6 g/dL — ABNORMAL LOW (ref 12.0–15.0)
Immature Granulocytes: 1 %
Lymphocytes Relative: 17 %
Lymphs Abs: 1.1 10*3/uL (ref 0.7–4.0)
MCH: 30 pg (ref 26.0–34.0)
MCHC: 30.9 g/dL (ref 30.0–36.0)
MCV: 96.9 fL (ref 80.0–100.0)
Monocytes Absolute: 0.6 10*3/uL (ref 0.1–1.0)
Monocytes Relative: 10 %
Neutro Abs: 4.3 10*3/uL (ref 1.7–7.7)
Neutrophils Relative %: 69 %
Platelets: 304 10*3/uL (ref 150–400)
RBC: 2.87 MIL/uL — ABNORMAL LOW (ref 3.87–5.11)
RDW: 14.6 % (ref 11.5–15.5)
WBC: 6.2 10*3/uL (ref 4.0–10.5)
nRBC: 0 % (ref 0.0–0.2)

## 2021-03-09 LAB — BASIC METABOLIC PANEL
Anion gap: 8 (ref 5–15)
BUN: 34 mg/dL — ABNORMAL HIGH (ref 8–23)
CO2: 33 mmol/L — ABNORMAL HIGH (ref 22–32)
Calcium: 9 mg/dL (ref 8.9–10.3)
Chloride: 92 mmol/L — ABNORMAL LOW (ref 98–111)
Creatinine, Ser: 0.47 mg/dL (ref 0.44–1.00)
GFR, Estimated: >60 mL/min (ref >60)
Glucose, Bld: 86 mg/dL (ref 70–99)
Potassium: 3.7 mmol/L (ref 3.5–5.1)
Sodium: 133 mmol/L — ABNORMAL LOW (ref 135–145)

## 2021-03-09 LAB — PHOSPHORUS: Phosphorus: 4.2 mg/dL (ref 2.5–4.6)

## 2021-03-09 LAB — MAGNESIUM: Magnesium: 1.8 mg/dL (ref 1.7–2.4)

## 2021-03-09 NOTE — Progress Notes (Signed)
Physical Therapy Treatment Patient Details Name: Virginia [name] MRN: [id number] DOB: 11-11-1950 Today's Date: 03/09/2021   History of Present Illness Pt is a 70 y.o. 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 fib, fibromyalgia, HTN, osteoporosis, and Hep C brought to ED via EMS from home with hypoxia after a fall at home.    PT Comments    Patient received in bed, reports she would like her anxiety medicine, RN asked but it is not due yet and pain medicine is not up on floor. Patient still agreeable to work with PT. Patient is independent with bed mobility, transfers with supervision and ambulated 60 feet with RW and supervision. Limited by O2 saturations and fatigue. Patient will continue to benefit from skilled PT while here to improve activity tolerance and strength.      Recommendations for follow up therapy are one component of a multi-disciplinary discharge planning process, led by the attending physician.  Recommendations may be updated based on patient status, additional functional criteria and insurance authorization.  Follow Up Recommendations  Home health PT;Supervision - Intermittent     Equipment Recommendations  None recommended by PT    Recommendations for Other Services       Precautions / Restrictions Precautions Precautions: Fall Precaution Comments: trach Restrictions Weight Bearing Restrictions: No     Mobility  Bed Mobility Overal bed mobility: Independent Bed Mobility: Supine to Sit;Sit to Supine     Supine to sit: Independent Sit to supine: Independent   General bed mobility comments: Only requires management of lines and leads    Transfers Overall transfer level: Needs assistance Equipment used: Rolling walker (2 wheeled) Transfers: Sit to/from Stand Sit to Stand: Supervision            Ambulation/Gait Ambulation/Gait assistance: Supervision Gait Distance (Feet): 60 Feet Assistive device: Rolling walker (2  wheeled) Gait Pattern/deviations: Step-through pattern;Decreased step length - right;Decreased step length - left;Narrow base of support Gait velocity: Slow   General Gait Details: Supervision for ambulation with RW. Assist for lines/leads. Seated rest break after 30 feet. O2 drops with mobility on 5 lpm. HR elevated . 140 also with ambulation.   Stairs             Wheelchair Mobility    Modified Rankin (Stroke Patients Only)       Balance Overall balance assessment: Modified Independent Sitting-balance support: Feet supported Sitting balance-Leahy Scale: Good     Standing balance support: Bilateral upper extremity supported;During functional activity Standing balance-Leahy Scale: Good Standing balance comment: uses RW, no lob, no physical support required                            Cognition Arousal/Alertness: Awake/alert Behavior During Therapy: WFL for tasks assessed/performed Overall Cognitive Status: Within Functional Limits for tasks assessed                                        Exercises      General Comments        Pertinent Vitals/Pain Pain Assessment: Faces Faces Pain Scale: Hurts little more Pain Location: Low Back Pain Descriptors / Indicators: Discomfort Pain Intervention(s): Limited activity within patient's tolerance;Monitored during session;Patient requesting pain meds-RN notified    Home Living  Prior Function            PT Goals (current goals can now be found in the care plan section) Acute Rehab PT Goals Patient Stated Goal: To go home PT Goal Formulation: With patient Time For Goal Achievement: 03/16/21 Potential to Achieve Goals: Good Progress towards PT goals: Progressing toward goals    Frequency    Min 2X/week      PT Plan Current plan remains appropriate    Co-evaluation              AM-PAC PT "6 Clicks" Mobility   Outcome Measure  Help needed  turning from your back to your side while in a flat bed without using bedrails?: None Help needed moving from lying on your back to sitting on the side of a flat bed without using bedrails?: None Help needed moving to and from a bed to a chair (including a wheelchair)?: A Little Help needed standing up from a chair using your arms (e.g., wheelchair or bedside chair)?: None Help needed to walk in hospital room?: A Little Help needed climbing 3-5 steps with a railing? : A Lot 6 Click Score: 20    End of Session Equipment Utilized During Treatment: Gait belt;Oxygen Activity Tolerance: Patient limited by fatigue Patient left: in bed;with call bell/phone within reach Nurse Communication: Mobility status PT Visit Diagnosis: Difficulty in walking, not elsewhere classified (R26.2)     Time: 3016-0109 PT Time Calculation (min) (ACUTE ONLY): 17 min  Charges:  $Gait Training: 8-22 mins                     Weston Fulco, PT, GCS 03/09/21,2:58 PM

## 2021-03-09 NOTE — TOC Progression Note (Addendum)
Transition of Care [facility]) - Progression Note    Patient Details  Name: Virginia [name] MRN: [id number] Date of Birth: [birth year]  Transition of Care [facility]) CM/SW Contact  Marina Goodell Phone Number: [phone] 03/09/2021, 3:08 PM  Clinical Narrative:     CSW left voicemail for patient's daughter in law [name] [phone].  CSW stated the patient is medically stable for d/c and will be able to d/c on Monday.  CSW requested Ms. Walker return call or contact TOC CSW/RNCM who is covering ICU to discuss discharge plan for Monday 03/12/2021. PT/OT recommending home health services.  CSW contact [facility] for possible home health PT/OT acceptance.  Patient has been active w/ [facility] in the past.   Expected Discharge Plan: Home w Home Health Services Barriers to Discharge: Continued Medical Work up  Expected Discharge Plan and Services Expected Discharge Plan: Home w Home Health Services In-house Referral: Clinical Social Work   Post Acute Care Choice: Durable Medical Equipment Living arrangements for the past 2 months: Apartment Expected Discharge Date: 02/23/21                                     Social Determinants of Health (SDOH) Interventions    Readmission Risk Interventions No flowsheet data found.

## 2021-03-09 NOTE — Progress Notes (Addendum)
NAME:  Virginia [name], MRN:  [id number], DOB:  Feb 11, 1951, LOS: 19 ADMISSION DATE:  02/18/2021, CONSULTATION DATE:  02/18/21 REFERRING MD:  Dr. Lenard Lance, CHIEF COMPLAINT:  Shortness of breath   History of Present Illness:  70 year old female presented to [facility] ED via EMS on 02/18/2021 with acute on chronic combined hypoxic and hypercapnic respiratory failure.   Per son, Virginia [name], patient lives at home alone and is assisted daily by himself and his wife as well as a home health nurse who comes out 3 times a week.  [name] reports it is he and his wife who help put her on her trilogy machine nightly.  The patient normally uses the trilogy as a CPAP mask with the tracheostomy capped overnight and is on 4.5 to 5 L nasal cannula during the day.   The patient's son reported that he and wife came down with COVID-19 last Wednesday, 02/14/2021, and have not been available to help the patient use this machine at night since 02/13/21, and so she has gone without it. Virginia [name] stated that his mother and her home health nurse reported to him that she has had multiple falls. He stated that yesterday, on 02/17/21, she fell in the bathroom and was stranded on the floor from 3 am until the home helper arrived hours later. The son, daughter-in-law and home health were concerned about the patient's respiratory status, specifically possible CO2 retention, and EMS was called. The first 2 times the patient refused to go with EMS. The third time however the patient's SpO2 was reading in the 50's with a good waveform, she was placed on CPAP and brought to the ED for evaluation.   PCCM consulted to admit to ICU as patient is requiring mechanical ventilation support.  The patient's mentation has improved but PCO2 after 1 hour is 95.  02/27/21- patient with less anxiety today.  She is smiling during my examination. She had CM consultation today and supplies should be delivered by this Friday with plan to dc home post NIV  optimization.  Trache site without exudation , vitals improving.  02/28/21- patient awaiting trache outflow valve for trilogy NIV to allow expiratory flow on Trilogy device so she can breathe at home.   03/01/21- patient slept better with alprazolam, She is awaiting supplies for ventilator at home to be used with trache. Family education session scheduled. PT and OT on going to treat severe physical deconditioning.   03/02/21- patient had some confusion with family members and they cant have home ventilator due to inability to understand function of NIV nor how to properly use equipment  03/03/21- patient with no overnight events. Anxiety level is improved  9/12 trach changed to #6 CUFFLESS, tolerated well 9/13 started AVAPS BIPAP target TV 450 Rate 12 9/14 started AVAPS BIPAP target TV 450 Rate 12  Pertinent  Medical History  Atrial Fibrillation on Eliquis C. Difficile Severe COPD Chronic tracheostomy Anxiety & depression Hepatitis C Arthritis HTN Fibromyalgia Anemia CAD Thoracic AAA Significant Hospital Events: Including procedures, antibiotic start and stop dates in addition to other pertinent events   02/18/21: Admit to ICU with acute on chronic combined hypoxic/hypercapnic respiratory failure with chronic tracheostomy requiring mechanical ventilation 8/29 severe copd exacerbation 8/30 did NOT tolerate PS mode, plan for PS mode today 8/31 failed weaning trials, CT ABD  8/30 CT ABD/PELVIS Possible transition point in the central pelvis suggesting partial or developing bowel obstruction over ileus. Distal small bowel and colon are relatively collapsed-casuing failure to wean  from vent 9/1 less distended on vent, alert and awake 9/2 wanting to go home, no complaints, feels she is at baseline 9/3 unable to go home yesterday due to need to have vent connected to trach (patient has been using CPAP mask), episodes of triggering the vent on pressure support last night, she will need a rate  nocturnally 9/4 slept well on mechanical ventilation nocturnally 02/26/21 patient is lucid , sheson HF trache 14L/min.  She is tachycardic and reports worse chest discomfort,concern for PE will have ct angio today, she requests xanax for labored breathing and anxiety 02/27/21- patient with less anxiety today.  She is smiling during my examination. She had CM consultation today and supplies should be delivered by this Friday with plan to dc home post NIV optimization.  Trache site without exudation , vitals improving. 02/28/21- patient awaiting trache outflow valve for trilogy NIV to allow expiratory flow on Trilogy device so she can breathe at home.  03/01/21- patient slept better with alprazolam, She is awaiting supplies for ventilator at home to be used with trache. Family education session scheduled. PT and OT on going to treat severe physical deconditioning.  03/02/21- patient had some confusion with family members and they cant have home ventilator due to inability to understand function of NIV nor how to properly use equipment 03/03/21- patient with no overnight events. Anxiety level is improved 9/12 trach changed to #6 CUFFLESS, tolerated well 9/13 started AVAPS BIPAP target TV 450 Rate 12 9/14 started AVAPS BIPAP target TV 450 Rate 12 9/15 tolerating AVAPS qhs; still awaiting discharge disposition  9/16: continues to tolerate AVAPS; tentative plan for discharge possibly Monday 03/12/21.    Interim History / Subjective:  -No acute events reported overnight -Hemodynamically stable, afebrile -Trach currently capped off, No distress -Tolerating AVAPS/BIPAP at NIGHT -COPD exacerbation due to Enterobacter aerogenes bronchitis and hypoxia/hypercarbia-completed ABX -Reports baseline SOB; otherwise denies all complaints   Objective   Blood pressure 118/71, pulse 78, temperature 98 F (36.7 C), temperature source Oral, resp. rate 17, height 5' (1.524 m), weight 54.8 kg, SpO2 100 %.    Vt Set:  [550 mL]  550 mL   Intake/Output Summary (Last 24 hours) at 03/09/2021 1102 Last data filed at 03/09/2021 0700 Gross per 24 hour  Intake --  Output 1400 ml  Net -1400 ml    Filed Weights   03/07/21 0406 03/08/21 0421 03/09/21 0500  Weight: 51.9 kg 51.9 kg 54.8 kg    Review of Systems: Positives in BOLD: Gen: Denies fever, chills, weight change, fatigue, night sweats HEENT: Denies blurred vision, double vision, hearing loss, tinnitus, sinus congestion, rhinorrhea, sore throat, neck stiffness, dysphagia PULM: Denies shortness of breath (baseline), cough, sputum production, hemoptysis, wheezing CV: Denies chest pain, edema, orthopnea, paroxysmal nocturnal dyspnea, palpitations GI: Denies abdominal pain, nausea, vomiting, diarrhea, hematochezia, melena, constipation, change in bowel habits GU: Denies dysuria, hematuria, polyuria, oliguria, urethral discharge Endocrine: Denies hot or cold intolerance, polyuria, polyphagia or appetite change Derm: Denies rash, dry skin, scaling or peeling skin change Heme: Denies easy bruising, bleeding, bleeding gums Neuro: Denies headache, numbness, weakness, slurred speech, loss of memory or consciousness   Physical Examination:   General Appearance: Chronically ill appearing female, sitting in bed, Tracheostomy currently capped off, NAD  EYES:  PERRLA, EOM intact. NEURO: Awake, Alert and oriented, follows commands, no focal deficits  NECK: Supple, No JVD midline tracheostomy clean/dry intact  Pulmonary: Diminished throughout, no wheezing or rhonchi noted, even, non labored  Cardiovascular: Normal rate  and rhythm, S1,S2.  No M/R/G, 2+ radial/2+ distal pulses present    Abdomen: Benign, soft, non-tender, non tender, BS+ x4    ALL OTHER ROS ARE NEGATIVE  Assessment & Plan:  70 year old former smoker, with acute on chronic hypoxic/hypercapnic respiratory failure due to COPD exacerbation with Enterobacter aerogenes bronchitis.  She has chronic ventilatory  dependence at home.  She was using Trilogy vent via mask rather than via trach.  She has done well here with ventilatory support via trach. 9/13 started AVAPS BIPAP target TV 450 Rate 12, tolerated well   Acute on Chronic Hypoxic & Hypercapnic Respiratory Failure due to AECOPD and Enterobacter aerogenes bronchitis. 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  However, pts family concerned about pt staying at home by herself while they are working . PT recommends Home Health.  TOC and pt's family (daughter in law [name]) are working on obtaining a Engineer, manufacturing systems. TOC to contact Ms. Walker again on Monday 03/12/21 to discuss discharge plan for return home with current PCS aide and family supports.  Best Practice (right click and "Reselect all SmartList Selections" daily)  Diet/type: Tolerating p.o. DVT prophylaxis: Eliquis GI prophylaxis: PPI Lines: N/A Foley:  N/A Code Status:  full code   Labs   CBC: Recent Labs  Lab 03/03/21 0539 03/05/21 0432 03/08/21 0301 03/09/21 0525  WBC 9.0 9.6 7.6 6.2  NEUTROABS 6.6 7.5  --  4.3  HGB 8.6* 9.7* 8.4* 8.6*  HCT 27.9* 30.5* 27.4* 27.8*  MCV 97.6  97.1 98.2 96.9  PLT 423* 454* 330 304     Basic Metabolic Panel: Recent Labs  Lab 03/03/21 0539 03/05/21 0432 03/09/21 0525  NA 137 137 133*  K 3.8 4.1 3.7  CL 97* 95* 92*  CO2 35* 33* 33*  GLUCOSE 89 99 86  BUN 17 16 34*  CREATININE 0.58 0.64 0.47  CALCIUM 8.8* 9.1 9.0  MG 2.0 2.0 1.8  PHOS 4.2 4.1 4.2    GFR: Estimated Creatinine Clearance: 50.8 mL/min (by C-G formula based on SCr of 0.47 mg/dL). Recent Labs  Lab 03/03/21 0539 03/05/21 0432 03/08/21 0301 03/09/21 0525  WBC 9.0 9.6 7.6 6.2    DISPOSITION SNF/REHAB  Harlon Ditty, AGACNP-BC [facility] Prefer epic messenger for cross cover needs If after hours, please call E-link    ICU ATTENDING ATTESTATION:  Patient seen and examined and relevant ancillary tests reviewed.   I agree with the assessment and plan of care as outlined by Harlon Ditty NP.  This patient was not seen as a shared visit. The following reflects my independent critical care time.  I  personally  reviewed database in its entirety and discussed care plan in detail. In addition, this patient was discussed on multidisciplinary rounds.  I agree with assessment and plan.  Can transfer to [facility] NO ICU Needs at this time     Lucie Leather, M.D.  [facility]  Medical Director [facility] Medical Director [facility] Cardio-Pulmonary Department

## 2021-03-10 DIAGNOSIS — D638 Anemia in other chronic diseases classified elsewhere: Secondary | ICD-10-CM

## 2021-03-10 DIAGNOSIS — J9622 Acute and chronic respiratory failure with hypercapnia: Secondary | ICD-10-CM | POA: Diagnosis not present

## 2021-03-10 DIAGNOSIS — I480 Paroxysmal atrial fibrillation: Secondary | ICD-10-CM

## 2021-03-10 DIAGNOSIS — J9621 Acute and chronic respiratory failure with hypoxia: Secondary | ICD-10-CM | POA: Diagnosis not present

## 2021-03-10 LAB — BASIC METABOLIC PANEL
Anion gap: 5 (ref 5–15)
BUN: 27 mg/dL — ABNORMAL HIGH (ref 8–23)
CO2: 35 mmol/L — ABNORMAL HIGH (ref 22–32)
Calcium: 8.9 mg/dL (ref 8.9–10.3)
Chloride: 96 mmol/L — ABNORMAL LOW (ref 98–111)
Creatinine, Ser: 0.56 mg/dL (ref 0.44–1.00)
GFR, Estimated: >60 mL/min (ref >60)
Glucose, Bld: 89 mg/dL (ref 70–99)
Potassium: 3.9 mmol/L (ref 3.5–5.1)
Sodium: 136 mmol/L (ref 135–145)

## 2021-03-10 LAB — CBC
HCT: 26.8 % — ABNORMAL LOW (ref 36.0–46.0)
Hemoglobin: 8.4 g/dL — ABNORMAL LOW (ref 12.0–15.0)
MCH: 30.2 pg (ref 26.0–34.0)
MCHC: 31.3 g/dL (ref 30.0–36.0)
MCV: 96.4 fL (ref 80.0–100.0)
Platelets: 285 10*3/uL (ref 150–400)
RBC: 2.78 MIL/uL — ABNORMAL LOW (ref 3.87–5.11)
RDW: 14.6 % (ref 11.5–15.5)
WBC: 5.9 10*3/uL (ref 4.0–10.5)
nRBC: 0 % (ref 0.0–0.2)

## 2021-03-10 LAB — PHOSPHORUS: Phosphorus: 3.6 mg/dL (ref 2.5–4.6)

## 2021-03-10 LAB — MAGNESIUM: Magnesium: 1.9 mg/dL (ref 1.7–2.4)

## 2021-03-10 NOTE — Progress Notes (Signed)
[facility] PROGRESS NOTE   Virginia [name] ATF:[id number] DOB: [birth year] DOA: 02/18/2021  PCP: Marguarite Arbour, MD  Brief History/Interval Summary: 71 year old female presented to [facility] ED via EMS on 02/18/2021 with acute on chronic combined hypoxic and hypercapnic respiratory failure.   Per son, Virginia [name], patient lives at home alone and is assisted daily by himself and his wife as well as a home health nurse who comes out 3 times a week.  Virginia [name] reports it is he and his wife who help put her on her trilogy machine nightly.  The patient normally uses the trilogy as a CPAP mask with the tracheostomy capped overnight and is on 4.5 to 5 L nasal cannula during the day.    The patient's son reported that he and wife came down with COVID-19 on 02/14/2021, and have not been available to help the patient use this machine at night since 02/13/21, and so she has gone without it. Virginia [name] stated that his mother and her home health nurse reported to him that she has had multiple falls. He stated that yesterday, on 02/17/21, she fell in the bathroom and was stranded on the floor from 3 am until the home helper arrived hours later. The son, daughter-in-law and home health were concerned about the patient's respiratory status, specifically possible CO2 retention, and EMS was called. The first 2 times the patient refused to go with EMS. The third time however the patient's SpO2 was reading in the 50's with a good waveform, she was placed on CPAP and brought to the ED for evaluation.   PCCM consulted to admit to ICU as patient is requiring mechanical ventilation support.     02/27/21- patient with less anxiety today.  She is smiling during my examination. She had CM consultation today and supplies should be delivered by this Friday with plan to dc home post NIV optimization.  Trache site without exudation , vitals improving.   02/28/21- patient awaiting trache outflow valve for trilogy NIV to allow  expiratory flow on Trilogy device so she can breathe at home.    03/01/21- patient slept better with alprazolam, She is awaiting supplies for ventilator at home to be used with trache. Family education session scheduled. PT and OT on going to treat severe physical deconditioning.    03/02/21- patient had some confusion with family members and they cant have home ventilator due to inability to understand function of NIV nor how to properly use equipment   03/03/21- patient with no overnight events. Anxiety level is improved   9/12 trach changed to #6 CUFFLESS, tolerated well 9/13 started AVAPS BIPAP target TV 450 Rate 12 9/14 started AVAPS BIPAP target TV 450 Rate 12    Consultants: Critical care medicine  Procedures: Tracheostomy    Subjective/Interval History: Patient on BiPAP this morning.  Seems to be comfortable.  Denies any complaints.  Overall she feels better.  No chest pain.  No nausea vomiting.     Assessment/Plan:  Acute on chronic hypoxic and hypercapnic respiratory failure/COPD with acute exacerbation/Enterobacter aerogenes bronchitis/End-stage COPD Patient was initially intubated and on mechanical ventilation.  She was admitted to the ICU.  She has chronic tracheostomy.  Stabilized and taken off of ventilator.  Tolerating nocturnal BiPAP.  Remains on low-dose steroids.  Also nebulizer treatments.  Does not appear to be on antibiotics anymore.  Seems to be stable from a respiratory standpoint. Per pulmonology patient will go back to using her trilogy ventilator when she returns home.  History of atrial fibrillation/essential hypertension Seems to be stable.  Home medications being continued in the form of metoprolol and diltiazem.  Also on apixaban.  Normocytic anemia Likely due to chronic disease.  No evidence of overt bleeding.  Hemoglobin has been stable.  Anxiety/mood disorder On alprazolam 3 times a day.  Looks like she is on Valium prior to admission.  Also noted to  be on gabapentin for unclear reasons.  Seroquel is also listed on her home medication list.  Dyslipidemia On Crestor.  Moderate protein calorie malnutrition Nutrition Problem: Moderate Malnutrition Etiology: acute illness  Signs/Symptoms: mild fat depletion, mild muscle depletion  Interventions: Refer to RD note for recommendations   DVT Prophylaxis: On apixaban Code Status: Full code Family Communication: Discussed with patient.  No family at bedside Disposition Plan:  Seen by PT and OT.  Home health is recommended.  Family working on getting 8 for this patient.  Per critical care medicine this will all be arranged on Monday.     Status is: Inpatient  Remains inpatient appropriate because:Inpatient level of care appropriate due to severity of illness  Dispo: The patient is from: Home              Anticipated d/c is to: Home              Patient currently is not medically stable to d/c.   Difficult to place patient No      Medications: Scheduled:  ALPRAZolam  0.5 mg Oral TID   apixaban  5 mg Oral BID   Chlorhexidine Gluconate Cloth  6 each Topical QHS   diltiazem  120 mg Oral Daily   feeding supplement  237 mL Oral TID BM   gabapentin  200 mg Oral TID   ipratropium-albuterol  3 mL Nebulization TID   metoprolol tartrate  50 mg Oral BID   multivitamin with minerals  1 tablet Oral Daily   pantoprazole  40 mg Oral QHS   predniSONE  5 mg Oral Daily   rosuvastatin  10 mg Oral QHS   cyanocobalamin  100 mcg Oral Daily   Vitamin D (Ergocalciferol)  50,000 Units Oral Q7 days   Continuous: WUJ:[id number], docusate sodium, HYDROcodone-acetaminophen, ipratropium-albuterol, polyethylene glycol, QUEtiapine  Antibiotics: Anti-infectives (From admission, onward)    Start     Dose/Rate Route Frequency Ordered Stop   02/23/21 2200  cefdinir (OMNICEF) capsule 300 mg        300 mg Oral Every 12 hours 02/23/21 1131 02/25/21 2234   02/23/21 0000  cefdinir (OMNICEF) 300 MG capsule         300 mg Oral 2 times daily 02/23/21 1504 02/26/21 2359   02/19/21 1100  ceFEPIme (MAXIPIME) 2 g in sodium chloride 0.9 % 100 mL IVPB  Status:  Discontinued        2 g 200 mL/hr over 30 Minutes Intravenous Every 12 hours 02/19/21 0337 02/23/21 1131   02/18/21 2345  ceFEPIme (MAXIPIME) 2 g in sodium chloride 0.9 % 100 mL IVPB        2 g 200 mL/hr over 30 Minutes Intravenous  Once 02/18/21 2255 02/19/21 0024       Objective:  Vital Signs  Vitals:   03/09/21 1940 03/10/21 0500 03/10/21 0717 03/10/21 0800  BP:    94/66  Pulse: 87  71 75  Resp: 16  20   Temp:    98.8 F (37.1 C)  TempSrc:    Oral  SpO2: 100%  100% 99%  Weight:  54.7 kg    Height:        Intake/Output Summary (Last 24 hours) at 03/10/2021 1137 Last data filed at 03/10/2021 0800 Gross per 24 hour  Intake --  Output 1550 ml  Net -1550 ml   Filed Weights   03/08/21 0421 03/09/21 0500 03/10/21 0500  Weight: 51.9 kg 54.8 kg 54.7 kg    General appearance: Awake alert.  In no distress Resp: Normal effort at rest.  Coarse breath sounds bilaterally.  No wheezing appreciated.  No rhonchi.  Few crackles at bases Cardio: S1-S2 is normal regular.  No S3-S4.  No rubs murmurs or bruit GI: Abdomen is soft.  Nontender nondistended.  Bowel sounds are present normal.  No masses organomegaly Extremities: No edema.  Full range of motion of lower extremities. Neurologic: Alert and oriented x3.  No focal neurological deficits.    Lab Results:  Data Reviewed: I have personally reviewed following labs and imaging studies  CBC: Recent Labs  Lab 03/05/21 0432 03/08/21 0301 03/09/21 0525 03/10/21 0448  WBC 9.6 7.6 6.2 5.9  NEUTROABS 7.5  --  4.3  --   HGB 9.7* 8.4* 8.6* 8.4*  HCT 30.5* 27.4* 27.8* 26.8*  MCV 97.1 98.2 96.9 96.4  PLT 454* 330 304 285    Basic Metabolic Panel: Recent Labs  Lab 03/05/21 0432 03/09/21 0525 03/10/21 0448  NA 137 133* 136  K 4.1 3.7 3.9  CL 95* 92* 96*  CO2 33* 33* 35*  GLUCOSE  99 86 89  BUN 16 34* 27*  CREATININE 0.64 0.47 0.56  CALCIUM 9.1 9.0 8.9  MG 2.0 1.8 1.9  PHOS 4.1 4.2 3.6    GFR: Estimated Creatinine Clearance: 50.8 mL/min (by C-G formula based on SCr of 0.56 mg/dL).    CBG: Recent Labs  Lab 03/05/21 1943 03/06/21 0056 03/06/21 0348 03/06/21 1941 03/07/21 0348  GLUCAP 135* 98 82 176* 111*     Recent Results (from the past 240 hour(s))  Culture, Respiratory w Gram Stain     Status: None   Collection Time: 03/04/21  2:56 PM   Specimen: Tracheal Aspirate; Respiratory  Result Value Ref Range Status   Specimen Description   Final    TRACHEAL ASPIRATE Performed at [facility], [address], [place], [place] [address]    Special Requests   Final    NONE Performed at [facility], [address] [place], [place] [address]    Gram Stain   Final    NO SQUAMOUS EPITHELIAL CELLS SEEN RARE WBC SEEN FEW GRAM POSITIVE RODS    Culture   Final    FEW ACINETOBACTER CALCOACETICUS/BAUMANNII COMPLEX FEW DIPHTHEROIDS(CORYNEBACTERIUM SPECIES) Standardized susceptibility testing for this organism is not available. Performed at [facility], [address] [place], [place] [address]    Report Status 03/07/2021 FINAL  Final   Organism ID, Bacteria ACINETOBACTER CALCOACETICUS/BAUMANNII COMPLEX  Final      Susceptibility   Acinetobacter calcoaceticus/baumannii complex - MIC*    CEFTAZIDIME 4 SENSITIVE Sensitive     CIPROFLOXACIN <=0.25 SENSITIVE Sensitive     GENTAMICIN <=1 SENSITIVE Sensitive     IMIPENEM <=0.25 SENSITIVE Sensitive     PIP/TAZO <=4 SENSITIVE Sensitive     TRIMETH/SULFA <=20 SENSITIVE Sensitive     AMPICILLIN/SULBACTAM <=2 SENSITIVE Sensitive     * FEW ACINETOBACTER CALCOACETICUS/BAUMANNII COMPLEX      Radiology Studies: No results found.     LOS: 20 days   Osvaldo Shipper  [facility] Web designer on [email or web address]  03/10/2021, 11:37 AM

## 2021-03-11 DIAGNOSIS — J9622 Acute and chronic respiratory failure with hypercapnia: Secondary | ICD-10-CM | POA: Diagnosis not present

## 2021-03-11 DIAGNOSIS — J9621 Acute and chronic respiratory failure with hypoxia: Secondary | ICD-10-CM | POA: Diagnosis not present

## 2021-03-11 DIAGNOSIS — I480 Paroxysmal atrial fibrillation: Secondary | ICD-10-CM | POA: Diagnosis not present

## 2021-03-11 DIAGNOSIS — D638 Anemia in other chronic diseases classified elsewhere: Secondary | ICD-10-CM | POA: Diagnosis not present

## 2021-03-11 MED ORDER — ACETAMINOPHEN 325 MG PO TABS
650.0000 mg | ORAL_TABLET | Freq: Four times a day (QID) | ORAL | Status: DC | PRN
  Administered 2021-03-11 – 2021-03-12 (×2): 650 mg via ORAL
  Filled 2021-03-11 (×2): qty 2

## 2021-03-11 MED ORDER — METOPROLOL TARTRATE 25 MG PO TABS
25.0000 mg | ORAL_TABLET | Freq: Two times a day (BID) | ORAL | Status: DC
  Administered 2021-03-12: 25 mg via ORAL
  Filled 2021-03-11: qty 1

## 2021-03-11 NOTE — Progress Notes (Signed)
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   Per son, Virginia [name], patient lives at home alone and is assisted daily by himself and his wife as well as a home health nurse who comes out 3 times a week.  [name] reports it is he and his wife who help put her on her trilogy machine nightly.  The patient normally uses the trilogy as a CPAP mask with the tracheostomy capped overnight and is on 4.5 to 5 L nasal cannula during the day.    The patient's son reported that he and wife came down with COVID-19 on 02/14/2021, and have not been available to help the patient use this machine at night since 02/13/21, and so she has gone without it. [name] stated that his mother and her home health nurse reported to him that she has had multiple falls. He stated that yesterday, on 02/17/21, she fell in the bathroom and was stranded on the floor from 3 am until the home helper arrived hours later. The son, daughter-in-law and home health were concerned about the patient's respiratory status, specifically possible CO2 retention, and EMS was called. The first 2 times the patient refused to go with EMS. The third time however the patient's SpO2 was reading in the 50's with a good waveform, she was placed on CPAP and brought to the ED for evaluation.   PCCM consulted to admit to ICU as patient is requiring mechanical ventilation support.     02/27/21- patient with less anxiety today.  She is smiling during my examination. She had CM consultation today and supplies should be delivered by this Friday with plan to dc home post NIV optimization.  Trache site without exudation , vitals improving.   02/28/21- patient awaiting trache outflow valve for trilogy NIV to allow  expiratory flow on Trilogy device so she can breathe at home.    03/01/21- patient slept better with alprazolam, She is awaiting supplies for ventilator at home to be used with trache. Family education session scheduled. PT and OT on going to treat severe physical deconditioning.    03/02/21- patient had some confusion with family members and they cant have home ventilator due to inability to understand function of NIV nor how to properly use equipment   03/03/21- patient with no overnight events. Anxiety level is improved   9/12 trach changed to #6 CUFFLESS, tolerated well 9/13 started AVAPS BIPAP target TV 450 Rate 12 9/14 started AVAPS BIPAP target TV 450 Rate 12    Consultants: Critical care medicine  Procedures: None    Subjective/Interval History: Patient currently off of BiPAP.  States that she is doing well.  Slept well overnight.  No issues yesterday.  Looking forward to going home tomorrow.     Assessment/Plan:  Acute on chronic hypoxic and hypercapnic respiratory failure/COPD with acute exacerbation/Enterobacter aerogenes bronchitis/End-stage COPD Patient was initially intubated and on mechanical ventilation.  She was admitted to the ICU.  She has chronic tracheostomy.  Stabilized and taken off of ventilator.  Tolerating nocturnal BiPAP.  Remains on low-dose steroids.  Also nebulizer treatments.  Does not appear to be on antibiotics anymore.   Per pulmonology patient will go back to using her trilogy ventilator when she returns home. Seems to be stable from a respiratory  standpoint.  Family is arranging for an aide who can stay with her when there is no one else in the house during the day hours.  History of atrial fibrillation/essential hypertension Seems to be stable.  Home medications being continued in the form of metoprolol and diltiazem.  Also on apixaban.  Blood pressure noted to be on the lower side.  May need to adjust her medications.  Normocytic anemia Likely due to  chronic disease.  No evidence of overt bleeding.  Hemoglobin has been stable.  Anxiety/mood disorder On alprazolam 3 times a day.  Looks like she is on Valium prior to admission.  Also noted to be on gabapentin for unclear reasons.  Seroquel is also listed on her home medication list.  These medications can be resumed at discharge.  Dyslipidemia On Crestor.  Moderate protein calorie malnutrition Nutrition Problem: Moderate Malnutrition Etiology: acute illness  Signs/Symptoms: mild fat depletion, mild muscle depletion  Interventions: Refer to RD note for recommendations   DVT Prophylaxis: On apixaban Code Status: Full code Family Communication: Discussed with patient.  No family at bedside Disposition Plan:  Seen by PT and OT.  Home health is recommended.  Family working on getting aide for this patient.  Per critical care medicine this will all be arranged on Monday.     Status is: Inpatient  Remains inpatient appropriate because:Inpatient level of care appropriate due to severity of illness  Dispo: The patient is from: Home              Anticipated d/c is to: Home              Patient currently is not medically stable to d/c.   Difficult to place patient No      Medications: Scheduled:  ALPRAZolam  0.5 mg Oral TID   apixaban  5 mg Oral BID   Chlorhexidine Gluconate Cloth  6 each Topical QHS   diltiazem  120 mg Oral Daily   feeding supplement  237 mL Oral TID BM   gabapentin  200 mg Oral TID   ipratropium-albuterol  3 mL Nebulization TID   metoprolol tartrate  50 mg Oral BID   multivitamin with minerals  1 tablet Oral Daily   pantoprazole  40 mg Oral QHS   predniSONE  5 mg Oral Daily   rosuvastatin  10 mg Oral QHS   cyanocobalamin  100 mcg Oral Daily   Vitamin D (Ergocalciferol)  50,000 Units Oral Q7 days   Continuous: EAV:[id number], docusate sodium, HYDROcodone-acetaminophen, ipratropium-albuterol, polyethylene glycol, QUEtiapine  Antibiotics: Anti-infectives  (From admission, onward)    Start     Dose/Rate Route Frequency Ordered Stop   02/23/21 2200  cefdinir (OMNICEF) capsule 300 mg        300 mg Oral Every 12 hours 02/23/21 1131 02/25/21 2234   02/23/21 0000  cefdinir (OMNICEF) 300 MG capsule        300 mg Oral 2 times daily 02/23/21 1504 02/26/21 2359   02/19/21 1100  ceFEPIme (MAXIPIME) 2 g in sodium chloride 0.9 % 100 mL IVPB  Status:  Discontinued        2 g 200 mL/hr over 30 Minutes Intravenous Every 12 hours 02/19/21 0337 02/23/21 1131   02/18/21 2345  ceFEPIme (MAXIPIME) 2 g in sodium chloride 0.9 % 100 mL IVPB        2 g 200 mL/hr over 30 Minutes Intravenous  Once 02/18/21 2255 02/19/21 0024       Objective:  Vital Signs  Vitals:   03/11/21 0000 03/11/21 0400 03/11/21 0732 03/11/21 0816  BP: 112/67 95/66 (!) 114/93   Pulse: 66 65 79 83  Resp: 20 (!) 21 14 15   Temp: 98.6 F (37 C) 98.4 F (36.9 C) 98 F (36.7 C)   TempSrc: Oral Oral Oral   SpO2: 97% 98% 100% 100%  Weight:      Height:        Intake/Output Summary (Last 24 hours) at 03/11/2021 0908 Last data filed at 03/11/2021 0732 Gross per 24 hour  Intake 120 ml  Output 1625 ml  Net -1505 ml    Filed Weights   03/08/21 0421 03/09/21 0500 03/10/21 0500  Weight: 51.9 kg 54.8 kg 54.7 kg    General appearance: Awake alert.  In no distress Resp: Mildly tachypneic.  No use of accessory muscles.  Coarse breath sounds bilaterally.  Few occasional wheezes.  No crackles. Cardio: S1-S2 is normal regular.  No S3-S4.  No rubs murmurs or bruit GI: Abdomen is soft.  Nontender nondistended.  Bowel sounds are present normal.  No masses organomegaly Extremities: No edema.  Full range of motion of lower extremities. Neurologic: Alert and oriented x3.  No focal neurological deficits.     Lab Results:  Data Reviewed: I have personally reviewed following labs and imaging studies  CBC: Recent Labs  Lab 03/05/21 0432 03/08/21 0301 03/09/21 0525 03/10/21 0448  WBC 9.6  7.6 6.2 5.9  NEUTROABS 7.5  --  4.3  --   HGB 9.7* 8.4* 8.6* 8.4*  HCT 30.5* 27.4* 27.8* 26.8*  MCV 97.1 98.2 96.9 96.4  PLT 454* 330 304 285     Basic Metabolic Panel: Recent Labs  Lab 03/05/21 0432 03/09/21 0525 03/10/21 0448  NA 137 133* 136  K 4.1 3.7 3.9  CL 95* 92* 96*  CO2 33* 33* 35*  GLUCOSE 99 86 89  BUN 16 34* 27*  CREATININE 0.64 0.47 0.56  CALCIUM 9.1 9.0 8.9  MG 2.0 1.8 1.9  PHOS 4.1 4.2 3.6     GFR: Estimated Creatinine Clearance: 50.8 mL/min (by C-G formula based on SCr of 0.56 mg/dL).    CBG: Recent Labs  Lab 03/05/21 1943 03/06/21 0056 03/06/21 0348 03/06/21 1941 03/07/21 0348  GLUCAP 135* 98 82 176* 111*      Recent Results (from the past 240 hour(s))  Culture, Respiratory w Gram Stain     Status: None   Collection Time: 03/04/21  2:56 PM   Specimen: Tracheal Aspirate; Respiratory  Result Value Ref Range Status   Specimen Description   Final    TRACHEAL ASPIRATE Performed at [facility], [address] [place], [place]    Special Requests   Final    NONE Performed at [facility], [address] [place], [place]    Gram Stain   Final    NO SQUAMOUS EPITHELIAL CELLS SEEN RARE WBC SEEN FEW GRAM POSITIVE RODS    Culture   Final    FEW ACINETOBACTER CALCOACETICUS/BAUMANNII COMPLEX FEW DIPHTHEROIDS(CORYNEBACTERIUM SPECIES) Standardized susceptibility testing for this organism is not available. Performed at [facility], [address] [place], [place]    Report Status 03/07/2021 FINAL  Final   Organism ID, Bacteria ACINETOBACTER CALCOACETICUS/BAUMANNII COMPLEX  Final      Susceptibility   Acinetobacter calcoaceticus/baumannii complex - MIC*    CEFTAZIDIME 4 SENSITIVE Sensitive     CIPROFLOXACIN <=0.25 SENSITIVE Sensitive     GENTAMICIN <=1 SENSITIVE  Sensitive     IMIPENEM <=0.25 SENSITIVE Sensitive     PIP/TAZO <=4 SENSITIVE Sensitive     TRIMETH/SULFA <=20  SENSITIVE Sensitive     AMPICILLIN/SULBACTAM <=2 SENSITIVE Sensitive     * FEW ACINETOBACTER CALCOACETICUS/BAUMANNII COMPLEX       Radiology Studies: No results found.     LOS: 21 days   [employer] on [email or web address]  03/11/2021, 9:08 AM

## 2021-03-12 DIAGNOSIS — J9621 Acute and chronic respiratory failure with hypoxia: Secondary | ICD-10-CM | POA: Diagnosis not present

## 2021-03-12 DIAGNOSIS — J9622 Acute and chronic respiratory failure with hypercapnia: Secondary | ICD-10-CM | POA: Diagnosis not present

## 2021-03-12 MED ORDER — DIAZEPAM 5 MG PO TABS: 5.0000 mg | ORAL_TABLET | Freq: Three times a day (TID) | ORAL | 0 refills | Status: DC | PRN

## 2021-03-12 MED ORDER — METOPROLOL TARTRATE 25 MG PO TABS: 25.0000 mg | ORAL_TABLET | Freq: Two times a day (BID) | ORAL | 2 refills | Status: DC

## 2021-03-12 NOTE — Discharge Summary (Signed)
[facility]  Physician Discharge Summary   Patient ID: Virginia [name] MRN: [id number] DOB/AGE: Dec 20, 1950 70 y.o.  Admit date: 02/18/2021 Discharge date: 03/12/2021    PCP: Virginia Arbour, MD  DISCHARGE DIAGNOSES:  Acute on chronic hypoxic and hypercapnic respiratory failure COPD with acute exacerbation Enterobacter aerogenes bronchitis End-stage COPD History of atrial fibrillation Essential hypertension Normocytic anemia Anxiety/mood disorder  RECOMMENDATIONS FOR OUTPATIENT FOLLOW UP: Home health has been ordered Outpatient surveillance of the thoracic aortic aneurysm with consideration of referral to cardiothoracic surgery.  Home Health: PT and OT Equipment/Devices: None.  Continue using trilogy ventilator at home  CODE STATUS: Full code  DISCHARGE CONDITION: fair  Diet recommendation: As before  INITIAL HISTORY: 70 year old female presented to [facility] ED via EMS on 02/18/2021 with acute on chronic combined hypoxic and hypercapnic respiratory failure.   Per son, Virginia [name], patient lives at home alone and is assisted daily by himself and his wife as well as a home health nurse who comes out 3 times a week.  Virginia [name] reports it is he and his wife who help put her on her trilogy machine nightly.  The patient normally uses the trilogy as a CPAP mask with the tracheostomy capped overnight and is on 4.5 to 5 L nasal cannula during the day.    The patient's son reported that he and wife came down with COVID-19 on 02/14/2021, and have not been available to help the patient use this machine at night since 02/13/21, and so she has gone without it. Virginia [name] stated that his mother and her home health nurse reported to him that she has had multiple falls. He stated that yesterday, on 02/17/21, she fell in the bathroom and was stranded on the floor from 3 am until the home helper arrived hours later. The son, daughter-in-law and home health were concerned about the patient's  respiratory status, specifically possible CO2 retention, and EMS was called. The first 2 times the patient refused to go with EMS. The third time however the patient's SpO2 was reading in the 50's with a good waveform, she was placed on CPAP and brought to the ED for evaluation.   PCCM consulted to admit to ICU as patient is requiring mechanical ventilation support.     02/27/21- patient with less anxiety today.  She is smiling during my examination. She had CM consultation today and supplies should be delivered by this Friday with plan to dc home post NIV optimization.  Trache site without exudation , vitals improving.   02/28/21- patient awaiting trache outflow valve for trilogy NIV to allow expiratory flow on Trilogy device so she can breathe at home.    03/01/21- patient slept better with alprazolam, She is awaiting supplies for ventilator at home to be used with trache. Family education session scheduled. PT and OT on going to treat severe physical deconditioning.    03/02/21- patient had some confusion with family members and they cant have home ventilator due to inability to understand function of NIV nor how to properly use equipment   03/03/21- patient with no overnight events. Anxiety level is improved   9/12 trach changed to #6 CUFFLESS, tolerated well 9/13 started AVAPS BIPAP target TV 450 Rate 12 9/14 started AVAPS BIPAP target TV 450 Rate 12     Consultants: Critical care medicine   HOSPITAL COURSE:   Acute on chronic hypoxic and hypercapnic respiratory failure/COPD with acute exacerbation/Enterobacter aerogenes bronchitis/End-stage COPD Patient was initially intubated and on mechanical ventilation.  She  was admitted to the ICU.  She has chronic tracheostomy.  Stabilized and taken off of ventilator.  Tolerating nocturnal BiPAP.  Remains on low-dose steroids.  Also nebulizer treatments.  Does not appear to be on antibiotics anymore.   Per pulmonology patient will go back to using her  trilogy ventilator when she returns home. Seems to be stable from a respiratory standpoint.  Family is arranging for an aide who can stay with her when there is no one else in the house during the day hours.  History of atrial fibrillation/essential hypertension Seems to be stable.  Home medications being continued in the form of metoprolol and diltiazem.  Also on apixaban.  Lisinopril discontinued due to low blood pressures.   Normocytic anemia Likely due to chronic disease.  No evidence of overt bleeding.  Hemoglobin has been stable.  Anxiety/mood disorder Resume home medications.  Dyslipidemia On Crestor.  Aneurysmal dilatation ascending thoracic aorta Incidentally noted on CT angiogram. 4.5 cm diameter; recommend semi-annual imaging followup by CTA or MRA and referral to cardiothoracic surgery if not already obtained.   Moderate protein calorie malnutrition Nutrition Problem: Moderate Malnutrition Etiology: acute illness   Signs/Symptoms: mild fat depletion, mild muscle depletion   Interventions: Refer to RD note for recommendations    Patient is stable.  Looking forward to going home today.  Okay for discharge.   PERTINENT LABS:  The results of significant diagnostics from this hospitalization (including imaging, microbiology, ancillary and laboratory) are listed below for reference.    Microbiology: Recent Results (from the past 240 hour(s))  Culture, Respiratory w Gram Stain     Status: None   Collection Time: 03/04/21  2:56 PM   Specimen: Tracheal Aspirate; Respiratory  Result Value Ref Range Status   Specimen Description   Final    TRACHEAL ASPIRATE Performed at [facility], [address] [place], [place] [address]    Special Requests   Final    NONE Performed at [facility], [address] [place], [place] [address]    Gram Stain   Final    NO SQUAMOUS EPITHELIAL CELLS SEEN RARE WBC SEEN FEW GRAM POSITIVE RODS    Culture   Final     FEW ACINETOBACTER CALCOACETICUS/BAUMANNII COMPLEX FEW DIPHTHEROIDS(CORYNEBACTERIUM SPECIES) Standardized susceptibility testing for this organism is not available. Performed at [facility], [address], [place], [place] [address]    Report Status 03/07/2021 FINAL  Final   Organism ID, Bacteria ACINETOBACTER CALCOACETICUS/BAUMANNII COMPLEX  Final      Susceptibility   Acinetobacter calcoaceticus/baumannii complex - MIC*    CEFTAZIDIME 4 SENSITIVE Sensitive     CIPROFLOXACIN <=0.25 SENSITIVE Sensitive     GENTAMICIN <=1 SENSITIVE Sensitive     IMIPENEM <=0.25 SENSITIVE Sensitive     PIP/TAZO <=4 SENSITIVE Sensitive     TRIMETH/SULFA <=20 SENSITIVE Sensitive     AMPICILLIN/SULBACTAM <=2 SENSITIVE Sensitive     * FEW ACINETOBACTER CALCOACETICUS/BAUMANNII COMPLEX     Labs:  COVID-19 Labs   Lab Results  Component Value Date   SARSCOV2NAA NEGATIVE 02/18/2021   SARSCOV2NAA NEGATIVE 01/06/2021   SARSCOV2NAA NEGATIVE 06/06/2020   SARSCOV2NAA NEGATIVE 06/02/2020      Basic Metabolic Panel: Recent Labs  Lab 03/09/21 0525 03/10/21 0448  NA 133* 136  K 3.7 3.9  CL 92* 96*  CO2 33* 35*  GLUCOSE 86 89  BUN 34* 27*  CREATININE 0.47 0.56  CALCIUM 9.0 8.9  MG 1.8 1.9  PHOS 4.2 3.6  CBC: Recent Labs  Lab 03/08/21 0301 03/09/21 0525 03/10/21 0448  WBC 7.6 6.2 5.9  NEUTROABS  --  4.3  --   HGB 8.4* 8.6* 8.4*  HCT 27.4* 27.8* 26.8*  MCV 98.2 96.9 96.4  PLT 330 304 285    BNP: BNP (last 3 results) Recent Labs    07/13/20 1507 01/06/21 1924 02/18/21 1905  BNP 145.7* 145.3* 316.4*     CBG: Recent Labs  Lab 03/06/21 1941 03/07/21 0348  GLUCAP 176* 111*     IMAGING STUDIES CT ABDOMEN PELVIS WO CONTRAST  Result Date: 02/20/2021 CLINICAL DATA:  Abdominal pain EXAM: CT ABDOMEN AND PELVIS WITHOUT CONTRAST TECHNIQUE: Multidetector CT imaging of the abdomen and pelvis was performed following the standard protocol without IV contrast. COMPARISON:   Radiograph 829 2022 FINDINGS: Lower chest: Lung bases demonstrate emphysema. Small right-sided pleural effusion. Partially visualized breast prostheses. Hepatobiliary: No focal liver abnormality is seen. No gallstones, gallbladder wall thickening, or biliary dilatation. Pancreas: Unremarkable. No pancreatic ductal dilatation or surrounding inflammatory changes. Spleen: Normal in size without focal abnormality. Adrenals/Urinary Tract: Adrenal glands are normal. Kidneys show no hydronephrosis. Nonspecific perinephric fat stranding. Bladder unremarkable Stomach/Bowel: Stomach is nonenlarged. Multiple air and fluid slightly distended small bowel within the mid abdomen and pelvis. Possible transition to decompressed distal small bowel in the central pelvis, series 2, image 54. Pelvic small bowel loops are decompressed. Stool in the colon. Distal colon is decompressed. Appendix is nonvisualized. Vascular/Lymphatic: Moderate aortic atherosclerosis. No aneurysm. Subcentimeter retroperitoneal lymph nodes. Reproductive: Status post hysterectomy. No adnexal masses. Other: No free air. Small amount of free fluid in pelvis and right upper quadrant Musculoskeletal: No acute or suspicious osseous lesion. Mild probably chronic superior endplate deformity at T12. IMPRESSION: 1. Mild fluid and air distension of mid to small bowel with possible transition point in the central pelvis suggesting partial or developing bowel obstruction over ileus. Distal small bowel and colon are relatively collapsed. 2. Small right-sided pleural effusion. Small amount of abdominopelvic ascites 3. Emphysema Electronically Signed   By: Jasmine Pang M.D.   On: 02/20/2021 22:09   DG Abd 1 View  Result Date: 02/19/2021 CLINICAL DATA:  Abdominal distention. EXAM: ABDOMEN - 1 VIEW COMPARISON:  KUB, 06/15/2020.  Chest radiograph, 02/18/2021. FINDINGS: Multiple air-and-fluid distended loops of small bowel and colon, extending to the level of the rectum.  IMPRESSION: Air and fluid distended bowel loops and colon, likely to represent ileus. Electronically Signed   By: Roanna Banning M.D.   On: 02/19/2021 07:48   CT HEAD WO CONTRAST ( )  Result Date: 02/18/2021 CLINICAL DATA:  Head trauma EXAM: CT HEAD WITHOUT CONTRAST CT CERVICAL SPINE WITHOUT CONTRAST TECHNIQUE: Multidetector CT imaging of the head and cervical spine was performed following the standard protocol without intravenous contrast. Multiplanar CT image reconstructions of the cervical spine were also generated. COMPARISON:  None. FINDINGS: CT HEAD FINDINGS Brain: There is no mass, hemorrhage or extra-axial collection. The size and configuration of the ventricles and extra-axial CSF spaces are normal. The brain parenchyma is normal, without evidence of acute or chronic infarction. Vascular: No abnormal hyperdensity of the major intracranial arteries or dural venous sinuses. No intracranial atherosclerosis. Skull: The visualized skull base, calvarium and extracranial soft tissues are normal. Sinuses/Orbits: No fluid levels or advanced mucosal thickening of the visualized paranasal sinuses. No mastoid or middle ear effusion. The orbits are normal. CT CERVICAL SPINE FINDINGS Alignment: No static subluxation. Facets are aligned. Occipital condyles are normally positioned. Skull base and vertebrae:  No acute fracture. Soft tissues and spinal canal: No prevertebral fluid or swelling. No visible canal hematoma. Disc levels: No advanced spinal canal or neural foraminal stenosis. Upper chest: COPD. Tracheostomy tube tip is at the level of the clavicular heads. Other: Normal visualized paraspinal cervical soft tissues. IMPRESSION: 1. No acute intracranial abnormality. 2. No acute fracture or static subluxation of the cervical spine. 3.  Emphysema (ICD10-J43.9). Electronically Signed   By: Deatra Robinson M.D.   On: 02/18/2021 22:42   CT Angio Chest Pulmonary Embolism (PE) W or WO Contrast  Result Date:  02/26/2021 CLINICAL DATA:  Increased shortness of breath, former smoker, COPD, high probability clinical suspicion of pulmonary embolism EXAM: CT ANGIOGRAPHY CHEST WITH CONTRAST TECHNIQUE: Multidetector CT imaging of the chest was performed using the standard protocol during bolus administration of intravenous contrast. Multiplanar CT image reconstructions and MIPs were obtained to evaluate the vascular anatomy. CONTRAST:  59mL OMNIPAQUE IOHEXOL 350 MG/ML SOLN IV COMPARISON:  CT chest 08/01/2017 FINDINGS: Cardiovascular: Aneurysmal dilatation of ascending thoracic aorta 4.5 cm diameter. Atherosclerotic calcifications aorta and minimally coronary arteries. No cardiac abnormalities. No pericardial effusion. Pulmonary arteries adequately opacified and patent. No evidence of pulmonary embolism. Mediastinum/Nodes: Esophagus unremarkable. Tracheostomy tube within trachea. Base of cervical region otherwise normal appearance. Beam hardening artifacts from RIGHT shoulder. No thoracic adenopathy. BILATERAL breast prostheses. Lungs/Pleura: Moderate RIGHT pleural effusion, partially loculated. Mild compressive atelectasis of RIGHT lower lobe. Extensive emphysematous changes with subsegmental atelectasis versus scarring at LEFT base. No acute infiltrate or pneumothorax. No discrete pulmonary mass. Upper Abdomen: Small LEFT renal cyst. Remaining visualized upper abdomen grossly unremarkable. Musculoskeletal: Demineralized. Old appearing superior endplate compression deformities of multiple thoracic vertebra unchanged. Review of the MIP images confirms the above findings. IMPRESSION: No evidence of pulmonary embolism. Moderate RIGHT pleural effusion, partially loculated, with compressive atelectasis of the RIGHT lower lobe. Underlying emphysematous changes. Aneurysmal dilatation ascending thoracic aorta, 4.5 cm diameter; recommend semi-annual imaging followup by CTA or MRA and referral to cardiothoracic surgery if not already  obtained. This recommendation follows 2010 ACCF/AHA/AATS/ACR/ASA/SCA/SCAI/SIR/STS/SVM Guidelines for the Diagnosis and Management of Patients With Thoracic Aortic Disease. Circulation. 2010; 121: E315-Q008. Aortic aneurysm NOS (ICD10-I71.9) Emphysema (ICD10-J43.9). Aortic Atherosclerosis (ICD10-I70.0). Aortic aneurysm NOS (ICD10-I71.9). Electronically Signed   By: Ulyses Southward M.D.   On: 02/26/2021 14:19   CT CERVICAL SPINE WO CONTRAST  Result Date: 02/18/2021 CLINICAL DATA:  Head trauma EXAM: CT HEAD WITHOUT CONTRAST CT CERVICAL SPINE WITHOUT CONTRAST TECHNIQUE: Multidetector CT imaging of the head and cervical spine was performed following the standard protocol without intravenous contrast. Multiplanar CT image reconstructions of the cervical spine were also generated. COMPARISON:  None. FINDINGS: CT HEAD FINDINGS Brain: There is no mass, hemorrhage or extra-axial collection. The size and configuration of the ventricles and extra-axial CSF spaces are normal. The brain parenchyma is normal, without evidence of acute or chronic infarction. Vascular: No abnormal hyperdensity of the major intracranial arteries or dural venous sinuses. No intracranial atherosclerosis. Skull: The visualized skull base, calvarium and extracranial soft tissues are normal. Sinuses/Orbits: No fluid levels or advanced mucosal thickening of the visualized paranasal sinuses. No mastoid or middle ear effusion. The orbits are normal. CT CERVICAL SPINE FINDINGS Alignment: No static subluxation. Facets are aligned. Occipital condyles are normally positioned. Skull base and vertebrae: No acute fracture. Soft tissues and spinal canal: No prevertebral fluid or swelling. No visible canal hematoma. Disc levels: No advanced spinal canal or neural foraminal stenosis. Upper chest: COPD. Tracheostomy tube tip is at the level  of the clavicular heads. Other: Normal visualized paraspinal cervical soft tissues. IMPRESSION: 1. No acute intracranial  abnormality. 2. No acute fracture or static subluxation of the cervical spine. 3.  Emphysema (ICD10-J43.9). Electronically Signed   By: Deatra Robinson M.D.   On: 02/18/2021 22:42   DG Chest Portable 1 View  Result Date: 02/18/2021 CLINICAL DATA:  Shortness of breath. EXAM: PORTABLE CHEST 1 VIEW COMPARISON:  January 06, 2021 FINDINGS: There is stable tracheostomy tube positioning. The lungs are hyperinflated. Mild, diffuse, chronic appearing increased interstitial lung markings are seen. There is no evidence of acute infiltrate, pleural effusion or pneumothorax. The cardiac silhouette is borderline in size. An intact right shoulder replacement is present. The visualized skeletal structures are otherwise unremarkable. IMPRESSION: Stable exam without active cardiopulmonary disease. Electronically Signed   By: Aram Candela M.D.   On: 02/18/2021 20:36    DISCHARGE EXAMINATION: Vitals:   03/12/21 0834 03/12/21 0900 03/12/21 1200 03/12/21 1600  BP: 121/76  98/70   Pulse: 99 99 72 86  Resp:  (!) 21 17 (!) 23  Temp:   97.8 F (36.6 C) 97.6 F (36.4 C)  TempSrc:   Oral Oral  SpO2:  99% 100% 100%  Weight:      Height:       General appearance: Awake alert.  In no distress Resp: Normal effort.  Coarse breath sounds bilaterally.  No wheezing. Cardio: S1-S2 is normal regular.  No S3-S4.  No rubs murmurs or bruit GI: Abdomen is soft.  Nontender nondistended.  Bowel sounds are present normal.  No masses organomegaly   DISPOSITION: Home  Discharge Instructions     Call MD for:  difficulty breathing, headache or visual disturbances   Complete by: As directed    Call MD for:  extreme fatigue   Complete by: As directed    Call MD for:  persistant dizziness or light-headedness   Complete by: As directed    Call MD for:  persistant nausea and vomiting   Complete by: As directed    Call MD for:  severe uncontrolled pain   Complete by: As directed    Call MD for:  temperature >100.4   Complete by:  As directed    Discharge instructions   Complete by: As directed    Please take your medications as prescribed.  Follow-up with your lung doctor.  Use your ventilator at home as recommended by the lung doctors.  You were cared for by a hospitalist during your hospital stay. If you have any questions about your discharge medications or the care you received while you were in the hospital after you are discharged, you can call the unit and asked to speak with the hospitalist on call if the hospitalist that took care of you is not available. Once you are discharged, your primary care physician will handle any further medical issues. Please note that NO REFILLS for any discharge medications will be authorized once you are discharged, as it is imperative that you return to your primary care physician (or establish a relationship with a primary care physician if you do not have one) for your aftercare needs so that they can reassess your need for medications and monitor your lab values. If you do not have a primary care physician, you can call [phone] for a physician referral.   For home use only DME Ventilator   Complete by: As directed    Home Vent settings per current set up ([facility]) VT 400, Breaths on  AUTO, FiO2 as previous. Needs to be delivered through trach. [facility] to teach family re Trilogy use set up.   Length of Need: 6 Months   Increase activity slowly   Complete by: As directed    No wound care   Complete by: As directed          Allergies as of 03/12/2021   No Known Allergies      Medication List     STOP taking these medications    furosemide 20 MG tablet Commonly known as: LASIX   lisinopril 10 MG tablet Commonly known as: ZESTRIL   multivitamin tablet   Trelegy Ellipta 100-62.5-25 MCG/INH Aepb Generic drug: Fluticasone-Umeclidin-Vilant       TAKE these medications    albuterol 108 (90 Base) MCG/ACT inhaler Commonly known as: VENTOLIN HFA Inhale 2 puffs  into the lungs every 4 (four) hours as needed for wheezing or shortness of breath.   amitriptyline 25 MG tablet Commonly known as: ELAVIL Take 25 mg by mouth at bedtime.   apixaban 5 MG Tabs tablet Commonly known as: Eliquis Take 1 tablet (5 mg total) by mouth 2 (two) times daily.   budesonide 0.5 MG/2ML nebulizer solution Commonly known as: PULMICORT Take 0.5 mg by nebulization daily.   cyanocobalamin 100 MCG tablet Take 1 tablet (100 mcg total) by mouth daily.   diazepam 5 MG tablet Commonly known as: VALIUM Take 1-1.5 tablets (5-7.5 mg total) by mouth every 8 (eight) hours as needed for anxiety. What changed: how much to take   diltiazem 120 MG 24 hr capsule Commonly known as: CARDIZEM CD Take 120 mg by mouth daily.   estradiol 1 MG tablet Commonly known as: ESTRACE Take 1 mg by mouth daily.   gabapentin 300 MG capsule Commonly known as: NEURONTIN Take 300 mg by mouth 3 (three) times daily.   ipratropium-albuterol 0.5-2.5 (3) MG/3ML Soln Commonly known as: DUONEB Take 3 mLs by nebulization every 6 (six) hours. What changed:  when to take this reasons to take this   iron polysaccharides 150 MG capsule Commonly known as: NIFEREX Take 1 capsule (150 mg total) by mouth daily.   MAGnesium-Oxide 400 (241.3 Mg) MG tablet Generic drug: magnesium oxide Take 400 mg by mouth daily.   methocarbamol 500 MG tablet Commonly known as: ROBAXIN Take 500 mg by mouth 2 (two) times daily.   metoprolol tartrate 25 MG tablet Commonly known as: LOPRESSOR Take 1 tablet (25 mg total) by mouth 2 (two) times daily. What changed:  medication strength how much to take   omeprazole 40 MG capsule Commonly known as: PRILOSEC Take 40 mg by mouth daily.   predniSONE 5 MG tablet Commonly known as: DELTASONE Take 5 mg by mouth daily.   QUEtiapine 25 MG tablet Commonly known as: SEROQUEL Take 25 mg by mouth at bedtime.   rosuvastatin 10 MG tablet Commonly known as: CRESTOR Take  10 mg by mouth at bedtime.   Vitamin D (Ergocalciferol) 1.25 MG (50000 UNIT) Caps capsule Commonly known as: DRISDOL Take 1 capsule (50,000 Units total) by mouth every 7 (seven) days.               Durable Medical Equipment  (From admission, onward)           Start     Ordered   02/23/21 0000  For home use only DME Ventilator       Comments: Home Vent settings per current set up ([facility]) VT 400, Breaths on AUTO, FiO2  as previous. Needs to be delivered through trach. [facility] to teach family re Trilogy use set up.  Question:  Length of Need  Answer:  6 Months   02/23/21 1504              Follow-up Information     Mertie Moores, MD. Schedule an appointment as soon as possible for a visit.   Specialty: Specialist Why: Make appointment with Dr. Meredeth Ide in 3 to 4 weeks. Contact information: [address] [place] [address] [phone]         Virginia Arbour, MD. Schedule an appointment as soon as possible for a visit.   Specialty: Internal Medicine Why: 1-2 weeks f/u appt. Contact information: [address] [facility] [place] [address] [phone]                 TOTAL DISCHARGE TIME: 35 minutes  Lottie Sigman Rito Ehrlich  [facility] Pager on [email or web address]  03/13/2021, 12:37 PM

## 2021-03-12 NOTE — Progress Notes (Signed)
Patient belonging prepped for discharge by previous shift. Discharge paperwork reviewed with DIL by author. Inner cannulas provided as patient does not have this supply at home. Message sent to hospitalist to confirm that script for cannulas sent. Patient transported downstairs via wheelchair by NT. No concerns. Voiced. IV and Purewick removed.

## 2021-03-12 NOTE — TOC Progression Note (Addendum)
Transition of Care [facility]) - Progression Note    Patient Details  Name: Virginia [name] MRN: [id number] Date of Birth: 1950/08/19  Transition of Care [facility]) CM/SW Contact  [place], RN Phone Number: 03/12/2021, 1:07 PM  Clinical Narrative:    Spoke with daughter in law-Sherry who confirmed she would be picking patient up around 6pm today. Cordelia Pen is working to make sure Oxygen is available and everything in place. Family continues to work on scheduling 24 hour a day care but has worked out family schedule for now.   Confirmed with Sarah @ [facility] patient would be discharging home today.    Expected Discharge Plan: Home w Home Health Services Barriers to Discharge: Continued Medical Work up  Expected Discharge Plan and Services Expected Discharge Plan: Home w Home Health Services In-house Referral: Clinical Social Work   Post Acute Care Choice: Durable Medical Equipment Living arrangements for the past 2 months: Apartment Expected Discharge Date: 03/12/21                                     Social Determinants of Health (SDOH) Interventions    Readmission Risk Interventions No flowsheet data found.

## 2021-03-20 ENCOUNTER — Other Ambulatory Visit: Payer: Self-pay

## 2021-03-20 ENCOUNTER — Inpatient Hospital Stay
Admission: EM | Admit: 2021-03-20 | Discharge: 2021-04-02 | DRG: 207 | Disposition: A | Discharge status: Hospice | Payer: Medicare Other | Attending: Internal Medicine | Admitting: Internal Medicine

## 2021-03-20 ENCOUNTER — Emergency Department: Payer: Medicare Other

## 2021-03-20 DIAGNOSIS — J9601 Acute respiratory failure with hypoxia: Secondary | ICD-10-CM | POA: Diagnosis not present

## 2021-03-20 DIAGNOSIS — J9622 Acute and chronic respiratory failure with hypercapnia: Secondary | ICD-10-CM | POA: Diagnosis present

## 2021-03-20 DIAGNOSIS — K921 Melena: Secondary | ICD-10-CM | POA: Diagnosis not present

## 2021-03-20 DIAGNOSIS — F410 Panic disorder [episodic paroxysmal anxiety] without agoraphobia: Secondary | ICD-10-CM | POA: Diagnosis present

## 2021-03-20 DIAGNOSIS — E785 Hyperlipidemia, unspecified: Secondary | ICD-10-CM | POA: Diagnosis present

## 2021-03-20 DIAGNOSIS — F32A Depression, unspecified: Secondary | ICD-10-CM | POA: Diagnosis present

## 2021-03-20 DIAGNOSIS — Z96611 Presence of right artificial shoulder joint: Secondary | ICD-10-CM | POA: Diagnosis present

## 2021-03-20 DIAGNOSIS — J9602 Acute respiratory failure with hypercapnia: Secondary | ICD-10-CM

## 2021-03-20 DIAGNOSIS — Z7189 Other specified counseling: Secondary | ICD-10-CM | POA: Diagnosis not present

## 2021-03-20 DIAGNOSIS — I482 Chronic atrial fibrillation, unspecified: Secondary | ICD-10-CM | POA: Diagnosis present

## 2021-03-20 DIAGNOSIS — I248 Other forms of acute ischemic heart disease: Secondary | ICD-10-CM | POA: Diagnosis present

## 2021-03-20 DIAGNOSIS — D519 Vitamin B12 deficiency anemia, unspecified: Secondary | ICD-10-CM | POA: Diagnosis present

## 2021-03-20 DIAGNOSIS — Z66 Do not resuscitate: Secondary | ICD-10-CM | POA: Diagnosis not present

## 2021-03-20 DIAGNOSIS — E872 Acidosis, unspecified: Secondary | ICD-10-CM | POA: Diagnosis present

## 2021-03-20 DIAGNOSIS — Z87891 Personal history of nicotine dependence: Secondary | ICD-10-CM

## 2021-03-20 DIAGNOSIS — G2581 Restless legs syndrome: Secondary | ICD-10-CM | POA: Diagnosis present

## 2021-03-20 DIAGNOSIS — Z79890 Hormone replacement therapy (postmenopausal): Secondary | ICD-10-CM

## 2021-03-20 DIAGNOSIS — E440 Moderate protein-calorie malnutrition: Secondary | ICD-10-CM | POA: Diagnosis present

## 2021-03-20 DIAGNOSIS — F411 Generalized anxiety disorder: Secondary | ICD-10-CM

## 2021-03-20 DIAGNOSIS — J0410 Acute tracheitis without obstruction: Secondary | ICD-10-CM | POA: Diagnosis present

## 2021-03-20 DIAGNOSIS — B182 Chronic viral hepatitis C: Secondary | ICD-10-CM | POA: Diagnosis present

## 2021-03-20 DIAGNOSIS — D509 Iron deficiency anemia, unspecified: Secondary | ICD-10-CM | POA: Diagnosis present

## 2021-03-20 DIAGNOSIS — Z7952 Long term (current) use of systemic steroids: Secondary | ICD-10-CM

## 2021-03-20 DIAGNOSIS — J9690 Respiratory failure, unspecified, unspecified whether with hypoxia or hypercapnia: Secondary | ICD-10-CM

## 2021-03-20 DIAGNOSIS — I959 Hypotension, unspecified: Secondary | ICD-10-CM | POA: Diagnosis present

## 2021-03-20 DIAGNOSIS — J90 Pleural effusion, not elsewhere classified: Secondary | ICD-10-CM

## 2021-03-20 DIAGNOSIS — D539 Nutritional anemia, unspecified: Secondary | ICD-10-CM | POA: Diagnosis present

## 2021-03-20 DIAGNOSIS — Z20822 Contact with and (suspected) exposure to covid-19: Secondary | ICD-10-CM | POA: Diagnosis present

## 2021-03-20 DIAGNOSIS — R404 Transient alteration of awareness: Secondary | ICD-10-CM | POA: Diagnosis present

## 2021-03-20 DIAGNOSIS — Z9882 Breast implant status: Secondary | ICD-10-CM

## 2021-03-20 DIAGNOSIS — J441 Chronic obstructive pulmonary disease with (acute) exacerbation: Principal | ICD-10-CM | POA: Diagnosis present

## 2021-03-20 DIAGNOSIS — Z8249 Family history of ischemic heart disease and other diseases of the circulatory system: Secondary | ICD-10-CM

## 2021-03-20 DIAGNOSIS — J9621 Acute and chronic respiratory failure with hypoxia: Secondary | ICD-10-CM

## 2021-03-20 DIAGNOSIS — Z9981 Dependence on supplemental oxygen: Secondary | ICD-10-CM

## 2021-03-20 DIAGNOSIS — I472 Ventricular tachycardia, unspecified: Secondary | ICD-10-CM | POA: Diagnosis not present

## 2021-03-20 DIAGNOSIS — D649 Anemia, unspecified: Secondary | ICD-10-CM | POA: Diagnosis not present

## 2021-03-20 DIAGNOSIS — I10 Essential (primary) hypertension: Secondary | ICD-10-CM | POA: Diagnosis present

## 2021-03-20 DIAGNOSIS — Z515 Encounter for palliative care: Secondary | ICD-10-CM | POA: Diagnosis not present

## 2021-03-20 DIAGNOSIS — Z79899 Other long term (current) drug therapy: Secondary | ICD-10-CM

## 2021-03-20 DIAGNOSIS — Z930 Tracheostomy status: Secondary | ICD-10-CM

## 2021-03-20 DIAGNOSIS — Z90710 Acquired absence of both cervix and uterus: Secondary | ICD-10-CM

## 2021-03-20 DIAGNOSIS — K922 Gastrointestinal hemorrhage, unspecified: Secondary | ICD-10-CM | POA: Diagnosis not present

## 2021-03-20 DIAGNOSIS — D638 Anemia in other chronic diseases classified elsewhere: Secondary | ICD-10-CM | POA: Diagnosis present

## 2021-03-20 DIAGNOSIS — R64 Cachexia: Secondary | ICD-10-CM | POA: Diagnosis present

## 2021-03-20 DIAGNOSIS — M1990 Unspecified osteoarthritis, unspecified site: Secondary | ICD-10-CM | POA: Diagnosis present

## 2021-03-20 DIAGNOSIS — G629 Polyneuropathy, unspecified: Secondary | ICD-10-CM | POA: Diagnosis present

## 2021-03-20 DIAGNOSIS — K219 Gastro-esophageal reflux disease without esophagitis: Secondary | ICD-10-CM | POA: Diagnosis present

## 2021-03-20 DIAGNOSIS — M810 Age-related osteoporosis without current pathological fracture: Secondary | ICD-10-CM | POA: Diagnosis present

## 2021-03-20 DIAGNOSIS — Z9151 Personal history of suicidal behavior: Secondary | ICD-10-CM

## 2021-03-20 DIAGNOSIS — T17990A Other foreign object in respiratory tract, part unspecified in causing asphyxiation, initial encounter: Secondary | ICD-10-CM | POA: Diagnosis present

## 2021-03-20 DIAGNOSIS — Z7901 Long term (current) use of anticoagulants: Secondary | ICD-10-CM

## 2021-03-20 DIAGNOSIS — Z931 Gastrostomy status: Secondary | ICD-10-CM

## 2021-03-20 DIAGNOSIS — D62 Acute posthemorrhagic anemia: Secondary | ICD-10-CM | POA: Diagnosis present

## 2021-03-20 DIAGNOSIS — I2510 Atherosclerotic heart disease of native coronary artery without angina pectoris: Secondary | ICD-10-CM | POA: Diagnosis present

## 2021-03-20 DIAGNOSIS — R748 Abnormal levels of other serum enzymes: Secondary | ICD-10-CM

## 2021-03-20 DIAGNOSIS — Z6820 Body mass index (BMI) 20.0-20.9, adult: Secondary | ICD-10-CM

## 2021-03-20 DIAGNOSIS — M797 Fibromyalgia: Secondary | ICD-10-CM | POA: Diagnosis present

## 2021-03-20 DIAGNOSIS — Z7951 Long term (current) use of inhaled steroids: Secondary | ICD-10-CM

## 2021-03-20 LAB — BLOOD GAS, VENOUS
Acid-Base Excess: 9.2 mmol/L — ABNORMAL HIGH (ref 0.0–2.0)
Acid-Base Excess: 12.2 mmol/L — ABNORMAL HIGH (ref 0.0–2.0)
Bicarbonate: 41.1 mmol/L — ABNORMAL HIGH (ref 20.0–28.0)
Bicarbonate: 38 mmol/L — ABNORMAL HIGH (ref 20.0–28.0)
FIO2: 21
FIO2: 0.4
MECHVT: 450 mL
Mechanical Rate: 18
O2 Saturation: 50.9 %
O2 Saturation: 31.9 %
PEEP: 5 cmH2O
Patient temperature: 37
Patient temperature: 37
RATE: 18 resp/min
pCO2, Ven: 118 mmHg (ref 44.0–60.0)
pCO2, Ven: 56 mmHg (ref 44.0–60.0)
pH, Ven: 7.15 — CL (ref 7.250–7.430)
pH, Ven: 7.44 — ABNORMAL HIGH (ref 7.250–7.430)
pO2, Ven: 36 mmHg (ref 32.0–45.0)
pO2, Ven: <31 mmHg — CL (ref 32.0–45.0)

## 2021-03-20 LAB — COMPREHENSIVE METABOLIC PANEL
ALT: 46 U/L — ABNORMAL HIGH (ref 0–44)
AST: 73 U/L — ABNORMAL HIGH (ref 15–41)
Albumin: 3.3 g/dL — ABNORMAL LOW (ref 3.5–5.0)
Alkaline Phosphatase: 37 U/L — ABNORMAL LOW (ref 38–126)
Anion gap: 13 (ref 5–15)
BUN: 15 mg/dL (ref 8–23)
CO2: 37 mmol/L — ABNORMAL HIGH (ref 22–32)
Calcium: 9 mg/dL (ref 8.9–10.3)
Chloride: 92 mmol/L — ABNORMAL LOW (ref 98–111)
Creatinine, Ser: 0.86 mg/dL (ref 0.44–1.00)
GFR, Estimated: >60 mL/min (ref >60)
Glucose, Bld: 133 mg/dL — ABNORMAL HIGH (ref 70–99)
Potassium: 4.2 mmol/L (ref 3.5–5.1)
Sodium: 142 mmol/L (ref 135–145)
Total Bilirubin: 0.9 mg/dL (ref 0.3–1.2)
Total Protein: 6.4 g/dL — ABNORMAL LOW (ref 6.5–8.1)

## 2021-03-20 LAB — CBC WITH DIFFERENTIAL/PLATELET
Abs Immature Granulocytes: 0.16 10*3/uL — ABNORMAL HIGH (ref 0.00–0.07)
Basophils Absolute: 0.1 10*3/uL (ref 0.0–0.1)
Basophils Relative: 1 %
Eosinophils Absolute: 0 10*3/uL (ref 0.0–0.5)
Eosinophils Relative: 0 %
HCT: 33.6 % — ABNORMAL LOW (ref 36.0–46.0)
Hemoglobin: 9.6 g/dL — ABNORMAL LOW (ref 12.0–15.0)
Immature Granulocytes: 2 %
Lymphocytes Relative: 11 %
Lymphs Abs: 0.7 10*3/uL (ref 0.7–4.0)
MCH: 29.9 pg (ref 26.0–34.0)
MCHC: 28.6 g/dL — ABNORMAL LOW (ref 30.0–36.0)
MCV: 104.7 fL — ABNORMAL HIGH (ref 80.0–100.0)
Monocytes Absolute: 0.4 10*3/uL (ref 0.1–1.0)
Monocytes Relative: 6 %
Neutro Abs: 5.2 10*3/uL (ref 1.7–7.7)
Neutrophils Relative %: 80 %
Platelets: 366 10*3/uL (ref 150–400)
RBC: 3.21 MIL/uL — ABNORMAL LOW (ref 3.87–5.11)
RDW: 14.4 % (ref 11.5–15.5)
WBC: 6.5 10*3/uL (ref 4.0–10.5)
nRBC: 0 % (ref 0.0–0.2)

## 2021-03-20 LAB — URINALYSIS, COMPLETE (UACMP) WITH MICROSCOPIC
Bacteria, UA: NONE SEEN
Bilirubin Urine: NEGATIVE
Glucose, UA: NEGATIVE mg/dL
Ketones, ur: 5 mg/dL — AB
Leukocytes,Ua: NEGATIVE
Nitrite: NEGATIVE
Protein, ur: 100 mg/dL — AB
Specific Gravity, Urine: 1.014 (ref 1.005–1.030)
pH: 5 (ref 5.0–8.0)

## 2021-03-20 LAB — TROPONIN I (HIGH SENSITIVITY)
Troponin I (High Sensitivity): 23 ng/L — ABNORMAL HIGH (ref <18)
Troponin I (High Sensitivity): 24 ng/L — ABNORMAL HIGH (ref <18)

## 2021-03-20 LAB — PROTIME-INR
INR: 1.1 (ref 0.8–1.2)
Prothrombin Time: 14.6 seconds (ref 11.4–15.2)

## 2021-03-20 LAB — PROCALCITONIN: Procalcitonin: 0.42 ng/mL

## 2021-03-20 LAB — LACTIC ACID, PLASMA
Lactic Acid, Venous: 3.4 mmol/L (ref 0.5–1.9)
Lactic Acid, Venous: 3.2 mmol/L (ref 0.5–1.9)

## 2021-03-20 LAB — HEMOGLOBIN AND HEMATOCRIT, BLOOD
HCT: 33.5 % — ABNORMAL LOW (ref 36.0–46.0)
Hemoglobin: 9.6 g/dL — ABNORMAL LOW (ref 12.0–15.0)

## 2021-03-20 LAB — RESP PANEL BY RT-PCR (FLU A&B, COVID) ARPGX2
Influenza A by PCR: NEGATIVE
Influenza B by PCR: NEGATIVE
SARS Coronavirus 2 by RT PCR: NEGATIVE

## 2021-03-20 LAB — MRSA NEXT GEN BY PCR, NASAL: MRSA by PCR Next Gen: NOT DETECTED

## 2021-03-20 LAB — BRAIN NATRIURETIC PEPTIDE: B Natriuretic Peptide: 670.7 pg/mL — ABNORMAL HIGH (ref 0.0–100.0)

## 2021-03-20 LAB — APTT: aPTT: 26 seconds (ref 24–36)

## 2021-03-20 MED ORDER — CHLORHEXIDINE GLUCONATE CLOTH 2 % EX PADS
6.0000 | MEDICATED_PAD | Freq: Every day | CUTANEOUS | Status: DC
  Administered 2021-03-20 – 2021-04-02 (×7): 6 via TOPICAL

## 2021-03-20 MED ORDER — FENTANYL 2500MCG IN NS 250ML (10MCG/ML) PREMIX INFUSION
25.0000 ug/h | INTRAVENOUS | Status: DC
  Administered 2021-03-20: 25 ug/h via INTRAVENOUS
  Filled 2021-03-20: qty 250

## 2021-03-20 MED ORDER — BUDESONIDE 0.25 MG/2ML IN SUSP
0.2500 mg | Freq: Two times a day (BID) | RESPIRATORY_TRACT | Status: DC
  Administered 2021-03-20 – 2021-04-02 (×26): 0.25 mg via RESPIRATORY_TRACT
  Filled 2021-03-20 (×27): qty 2

## 2021-03-20 MED ORDER — DOCUSATE SODIUM 50 MG/5ML PO LIQD
100.0000 mg | Freq: Two times a day (BID) | ORAL | Status: DC
  Administered 2021-03-23 – 2021-03-26 (×5): 100 mg
  Filled 2021-03-20 (×14): qty 10

## 2021-03-20 MED ORDER — POLYETHYLENE GLYCOL 3350 17 G PO PACK: 17.0000 g | PACK | Freq: Every day | ORAL | Status: DC | PRN

## 2021-03-20 MED ORDER — CHLORHEXIDINE GLUCONATE 0.12 % MT SOLN
15.0000 mL | Freq: Two times a day (BID) | OROMUCOSAL | Status: DC
  Administered 2021-03-20 – 2021-04-02 (×25): 15 mL via OROMUCOSAL
  Filled 2021-03-20 (×23): qty 15

## 2021-03-20 MED ORDER — SODIUM CHLORIDE 0.9 % IV SOLN
2.0000 g | Freq: Once | INTRAVENOUS | Status: AC
  Administered 2021-03-20: 2 g via INTRAVENOUS
  Filled 2021-03-20: qty 20

## 2021-03-20 MED ORDER — IPRATROPIUM-ALBUTEROL 0.5-2.5 (3) MG/3ML IN SOLN
3.0000 mL | Freq: Four times a day (QID) | RESPIRATORY_TRACT | Status: DC
  Administered 2021-03-20 – 2021-04-02 (×52): 3 mL via RESPIRATORY_TRACT
  Filled 2021-03-20 (×54): qty 3

## 2021-03-20 MED ORDER — LACTATED RINGERS IV SOLN: INTRAVENOUS | Status: AC

## 2021-03-20 MED ORDER — NOREPINEPHRINE 4 MG/250ML-% IV SOLN
0.0000 ug/min | INTRAVENOUS | Status: DC
Start: 2021-03-20 — End: 2021-03-21
  Administered 2021-03-20: 2 ug/min via INTRAVENOUS
  Filled 2021-03-20: qty 250

## 2021-03-20 MED ORDER — METHYLPREDNISOLONE SODIUM SUCC 40 MG IJ SOLR
40.0000 mg | Freq: Two times a day (BID) | INTRAMUSCULAR | Status: DC
  Administered 2021-03-20: 40 mg via INTRAVENOUS
  Filled 2021-03-20: qty 1

## 2021-03-20 MED ORDER — PANTOPRAZOLE SODIUM 40 MG IV SOLR
40.0000 mg | Freq: Two times a day (BID) | INTRAVENOUS | Status: DC
  Administered 2021-03-20 – 2021-03-28 (×16): 40 mg via INTRAVENOUS
  Filled 2021-03-20 (×17): qty 40

## 2021-03-20 MED ORDER — MORPHINE SULFATE (PF) 2 MG/ML IV SOLN
2.0000 mg | INTRAVENOUS | Status: DC | PRN
  Administered 2021-03-20 – 2021-03-23 (×9): 2 mg via INTRAVENOUS
  Filled 2021-03-20 (×9): qty 1

## 2021-03-20 MED ORDER — DOCUSATE SODIUM 100 MG PO CAPS: 100.0000 mg | ORAL_CAPSULE | Freq: Two times a day (BID) | ORAL | Status: DC | PRN

## 2021-03-20 MED ORDER — VANCOMYCIN HCL IN DEXTROSE 1-5 GM/200ML-% IV SOLN
1000.0000 mg | Freq: Once | INTRAVENOUS | Status: AC
  Administered 2021-03-20: 1000 mg via INTRAVENOUS
  Filled 2021-03-20: qty 200

## 2021-03-20 MED ORDER — SODIUM CHLORIDE 0.9 % IV SOLN
500.0000 mg | Freq: Once | INTRAVENOUS | Status: AC
  Administered 2021-03-20: 500 mg via INTRAVENOUS
  Filled 2021-03-20: qty 500

## 2021-03-20 MED ORDER — ORAL CARE MOUTH RINSE
15.0000 mL | Freq: Two times a day (BID) | OROMUCOSAL | Status: DC
  Administered 2021-03-21 – 2021-04-02 (×24): 15 mL via OROMUCOSAL

## 2021-03-20 MED ORDER — IPRATROPIUM-ALBUTEROL 0.5-2.5 (3) MG/3ML IN SOLN: 3.0000 mL | Freq: Four times a day (QID) | RESPIRATORY_TRACT | Status: DC | PRN

## 2021-03-20 MED ORDER — POLYETHYLENE GLYCOL 3350 17 G PO PACK
17.0000 g | PACK | Freq: Every day | ORAL | Status: DC
  Administered 2021-03-26: 17 g
  Filled 2021-03-20 (×3): qty 1

## 2021-03-20 MED ORDER — LACTATED RINGERS IV BOLUS (SEPSIS)
1000.0000 mL | Freq: Once | INTRAVENOUS | Status: AC
  Administered 2021-03-20: 1000 mL via INTRAVENOUS

## 2021-03-20 NOTE — ED Notes (Signed)
Large tarry stool observed, Dr Vicente Males performed fecal occult blood test which was positive. Pt cleaned, clean brief applied.

## 2021-03-20 NOTE — ED Triage Notes (Addendum)
Pt arrives via EMS from home. Her son called EMS stating pt was unresponsive, EMS reports initial GCS of 3 and unreadable oxygen sat. They provided airway support via bagging in route - she has a trach. 2mg  narcan given and EMS reports pt being responsive afterwards. Also admin NS bolus, bgl was 124. Per her son on scene, she is a DNR but no copy was readily available for EMS or brought to ED.   Upon arrival to ED, pt is 95% on room air and opens her eyes spontaneously. Is unresponsive to verbal and painful stimuli.  RT at bedside. MD at bedside.  1503 - oxygen sat dropped to 60% on room air, bagging initiated via trach. Oxygen up to 80% on room air.  1507 - RT switched trach, initiated ventilator for oxygen support. Oxygen sat 92%.

## 2021-03-20 NOTE — Progress Notes (Signed)
Elink following Code Sepsis bundle. ?

## 2021-03-20 NOTE — ED Notes (Signed)
Pt appears alert, is responsive to verbal stimuli and follows commands. Appears uncomfortable, is moving around in bed - assisted pt with repositioning. Nods her head yes when asked "Are you in pain?". Messaged attending provider to inquire about any available medication for pain.

## 2021-03-20 NOTE — Progress Notes (Addendum)
eLink Physician-Brief Progress Note Patient Name: Virginia [name] DOB: [birth year] MRN: [id number]   Date of Service  03/20/2021  HPI/Events of Note  70/F with chronic trache brought in from home via EMS as she was unresponsive.  She was bagged and in the ED, cuffless trache changed to cuffed trache and placed on the vent.  VBG with pH 7.15, pco2 118. Pt placed on levophed.  Pt started on empiric antibiotics and is on the vent.   She is awake. BP 145/114, HR 94, RR 16, O2 sats 97% on 40%. Temp 101.1  eICU Interventions  Start on IV steroids with methylprednisolone.  Continue empiric antibiotics.  Continue neb treatments and follow VBG.  Continue eliquis.  Prilosec 40mg .  Follow up cultures - blood, sputum.      Intervention Category Evaluation Type: New Patient Evaluation  03/20/2021, 10:41 PM  11:34 PM Notified by NP that there is concern of GI bleed and eliquis has been held.   Plan> Agree with PPI BID.

## 2021-03-20 NOTE — Progress Notes (Signed)
Patient received from EMS on room air.  #6 Cuffless Shiley Flex [phone]) in place.  Inner cannula noted to partially occluded by dried secretions (removed).  SpO2 initially mid 90% range on room air with shallow respirations.  LOC decreased and respirations slowed with subsequent desaturation.  Cuffless trach exchanged for cuffed #6 shiley trach [facility]).  Pt placed on vent with settings per flowsheet.  Placement confirmed via auscultation and CXR.

## 2021-03-20 NOTE — ED Notes (Signed)
1652 - pt coughed, vent tube dislodged from trach and large amount of mucous observed. Vent reconnected. RT called. Oxygen stable at 96%.  1656 - RT to bedside to suction pt.

## 2021-03-20 NOTE — Consult Note (Signed)
CODE SEPSIS - PHARMACY COMMUNICATION  **Broad Spectrum Antibiotics should be administered within 1 hour of Sepsis diagnosis**  Time Code Sepsis Called/Page Received: 1524  Antibiotics Ordered:  Azithromycin Ceftriaxone vancomycin  Time of 1st antibiotic administration: 1555  Additional action taken by pharmacy: noe  If necessary, Name of Provider/Nurse Contacted: n/a   Virginia [name] PharmD, BCPS 03/20/2021 3:28 PM

## 2021-03-20 NOTE — ED Notes (Signed)
RT at bedside suctioning trach

## 2021-03-20 NOTE — Consult Note (Signed)
PHARMACY -  BRIEF ANTIBIOTIC NOTE   Pharmacy has received consult(s) for Vancomycin from an ED provider.  The patient's profile has been reviewed for ht/wt/allergies/indication/available labs.    One time order(s) placed for  Vancomycin  Further antibiotics/pharmacy consults should be ordered by admitting physician if indicated.                       Thank you, Greyson Riccardi Rodriguez-Guzman PharmD, BCPS 03/20/2021 3:30 PM

## 2021-03-20 NOTE — Progress Notes (Signed)
Attempted ABG X2. Unsuccessful each attempt. NP and receiving RT aware.

## 2021-03-20 NOTE — ED Provider Notes (Signed)
[facility] Emergency Department Provider Note   ____________________________________________   Event Date/Time   First MD Initiated Contact with Patient 03/20/21 1502     (approximate)  I have reviewed the triage vital signs and the nursing notes.   HISTORY  Chief Complaint unresponsive    HPI Virginia [name] is a 70 y.o. female who presents via EMS with concerns for hypoxia and altered mental status.  Patient is unable to participate in history and review of systems and therefore history obtained from staff.  Patient reportedly recently discharged from the hospital and has been declining since then including worsening oxygenation and more somnolence than usual.  Patient was found to be with oxygen levels in the 60s-70s during transport          Past Medical History:  Diagnosis Date   Anemia    Anxiety    Arthritis    Atrial fibrillation (HCC)    C. difficile colitis 09/2015   COPD (chronic obstructive pulmonary disease) (HCC)    Depression    Dyspnea    Dysrhythmia    Fibromyalgia    H/O tracheostomy    Hep C w/ coma, chronic    Hypertension    MRSA pneumonia (HCC) 2017   On home oxygen therapy    3 L/M    Osteoporosis    Peripheral neuropathy    RLS (restless legs syndrome)    S/P percutaneous endoscopic gastrostomy (PEG) tube placement (HCC) 09/2015   Ventilator associated pneumonia (HCC) 10/2015   [facility] [place]    Patient Active Problem List   Diagnosis Date Noted   Pneumonia due to enterobacter aerogenes (HCC) 02/23/2021   Acute respiratory failure (HCC) 02/18/2021   SVT (supraventricular tachycardia) (HCC) 02/18/2021   Malnutrition of moderate degree 01/08/2021   On mechanically assisted ventilation (HCC) 01/07/2021   Hyperlipidemia 01/07/2021   Anxiety 01/07/2021   Acute on chronic respiratory failure (HCC) 01/06/2021   Anorexia 08/04/2020   Severe protein-calorie malnutrition (HCC) 08/04/2020    Pressure injury of skin 07/14/2020   Diarrhea    Reactive thrombocytosis 07/12/2020   Aspiration pneumonia of both lower lobes due to gastric secretions (HCC) 07/07/2020   Acute urinary retention 07/06/2020   Acute metabolic encephalopathy 07/06/2020   Acute on chronic respiratory failure with hypoxia and hypercapnia (HCC) 07/06/2020   COPD exacerbation (HCC)    Respiratory failure (HCC) 06/06/2020   Acute respiratory failure with hypoxia (HCC) 06/02/2020   COPD with acute exacerbation (HCC) 06/02/2020   Shortness of breath 06/02/2020   GERD (gastroesophageal reflux disease) 06/02/2020   Iron deficiency anemia 01/22/2020   Anemia of chronic disease 01/17/2019   Coronary artery disease involving native coronary artery of native heart 04/30/2018   Palpitations 04/30/2018   Chronic heartburn 04/12/2018   Dysphagia 04/12/2018   Thoracic aortic aneurysm without rupture (HCC) 08/27/2017   Status post reverse total shoulder replacement, 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   SOBOE (shortness of breath on exertion) 01/24/2017   AF (paroxysmal atrial fibrillation) (HCC) 11/09/2016   Chronic hypoxemic respiratory failure (HCC) 12/06/2015   Depression 12/06/2015   Fibromyalgia 12/06/2015   Hep C w/o coma, chronic (HCC) 12/06/2015   History of tracheostomy 12/06/2015   Hypertension 12/06/2015   Localized osteoporosis without current pathological fracture 12/06/2015   PEG (percutaneous endoscopic gastrostomy) status (HCC) 12/06/2015   Peripheral neuropathy, idiopathic 12/06/2015   RLS (restless legs syndrome) 12/06/2015   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   COPD with acute lower respiratory infection (HCC)  11/29/2015    Past Surgical History:  Procedure Laterality Date   ABDOMINAL HYSTERECTOMY     BREAST SURGERY Bilateral    Breast Implants   DILATION AND CURETTAGE OF UTERUS     PEG PLACEMENT N/A 06/21/2020   Procedure: PERCUTANEOUS ENDOSCOPIC GASTROSTOMY (PEG) PLACEMENT;  Surgeon: Regis Bill, MD;  Location: [facility] ENDOSCOPY;  Service: Endoscopy;  Laterality: N/A;   REVERSE SHOULDER ARTHROPLASTY Right 03/06/2017   Procedure: REVERSE SHOULDER ARTHROPLASTY;  Surgeon: Christena Flake, MD;  Location: [facility] ORS;  Service: Orthopedics;  Laterality: Right;   ROTATOR CUFF REPAIR Bilateral    TRACHEOSTOMY TUBE PLACEMENT N/A 06/15/2020   Procedure: TRACHEOSTOMY;  Surgeon: Geanie Logan, MD;  Location: [facility] ORS;  Service: ENT;  Laterality: N/A;    Prior to Admission medications   Medication Sig Start Date End Date Taking? Authorizing Provider  acetaminophen (TYLENOL) 500 MG tablet Take 1,000 mg by mouth every 6 (six) hours as needed for mild pain.   Yes [provider]  apixaban (ELIQUIS) 5 MG TABS tablet Take 1 tablet (5 mg total) by mouth 2 (two) times daily. 11/10/16  Yes Mody, Patricia Pesa, MD  diazepam (VALIUM) 5 MG tablet Take 1-1.5 tablets (5-7.5 mg total) by mouth every 8 (eight) hours as needed for anxiety. 03/12/21  Yes Osvaldo Shipper, MD  estradiol (ESTRACE) 1 MG tablet Take 1 mg by mouth daily. 09/10/16  Yes [provider]  gabapentin (NEURONTIN) 300 MG capsule Take 300 mg by mouth 3 (three) times daily. 05/06/20  Yes [provider]  iron polysaccharides (NIFEREX) 150 MG capsule Take 1 capsule (150 mg total) by mouth daily. 01/12/21 04/12/21 Yes Gillis Santa, MD  MAGNESIUM-OXIDE 400 (241.3 Mg) MG tablet Take 400 mg by mouth daily.  08/24/16  Yes [provider]  methocarbamol (ROBAXIN) 500 MG tablet Take 500 mg by mouth 2 (two) times daily. 12/27/20  Yes [provider]  metoprolol tartrate (LOPRESSOR) 25 MG tablet Take 1 tablet (25 mg total) by mouth 2 (two) times daily. 03/12/21 06/10/21 Yes Osvaldo Shipper, MD  omeprazole (PRILOSEC) 40 MG capsule Take 40 mg by mouth daily. 04/16/19  Yes [provider]  predniSONE (DELTASONE) 5 MG tablet Take 5 mg by mouth daily. 12/25/20  Yes [provider]  QUEtiapine (SEROQUEL) 25 MG tablet  Take 25 mg by mouth at bedtime.  10/01/16  Yes [provider]  rosuvastatin (CRESTOR) 10 MG tablet Take 10 mg by mouth at bedtime. 10/23/20  Yes [provider]  vitamin B-12 100 MCG tablet Take 1 tablet (100 mcg total) by mouth daily. 01/12/21 04/12/21 Yes Gillis Santa, MD  albuterol (VENTOLIN HFA) 108 (90 Base) MCG/ACT inhaler Inhale 2 puffs into the lungs every 4 (four) hours as needed for wheezing or shortness of breath.    [provider]  amitriptyline (ELAVIL) 25 MG tablet Take 25 mg by mouth at bedtime.  01/14/19   [provider]  budesonide (PULMICORT) 0.5 MG/2ML nebulizer solution Take 0.5 mg by nebulization daily. 11/28/20   [provider]  diltiazem (CARDIZEM CD) 120 MG 24 hr capsule Take 120 mg by mouth daily. Patient not taking: No sig reported 04/29/19   [provider]  ipratropium-albuterol (DUONEB) 0.5-2.5 (3) MG/3ML SOLN Take 3 mLs by nebulization every 6 (six) hours. 02/23/21   Salena Saner, MD  lisinopril (ZESTRIL) 10 MG tablet Take 10 mg by mouth daily. Patient not taking: No sig reported 03/16/21   [provider]  Vitamin D, Ergocalciferol, (  DRISDOL) 1.25 MG (50000 UNIT) CAPS capsule Take 1 capsule (50,000 Units total) by mouth every 7 (seven) days. Patient not taking: No sig reported 01/17/21 04/17/21  Gillis Santa, MD    Allergies Patient has no known allergies.  Family History  Problem Relation Age of Onset   Hypertension Mother     Social History Social History   Tobacco Use   Smoking status: Former    Packs/day: 1.50    Types: Cigarettes    Quit date: 10/05/2015    Years since quitting: 5.4   Smokeless tobacco: Never  Vaping Use   Vaping Use: Some days   Last attempt to quit: 10/05/2015  Substance Use Topics   Alcohol use: No   Drug use: No    Review of Systems Unable to assess ____________________________________________   PHYSICAL EXAM:  VITAL SIGNS: ED Triage Vitals  Enc  Vitals Group     BP 03/20/21 1500 113/80     Pulse Rate 03/20/21 1505 77     Resp 03/20/21 1514 18     Temp 03/20/21 1900 99.6 F (37.6 C)     Temp src --      SpO2 03/20/21 1500 95 %     Weight 03/20/21 1528 119 lb 0.8 oz (54 kg)     Height --      Head Circumference --      Peak Flow --      Pain Score 03/20/21 1915 Asleep     Pain Loc --      Pain Edu? --      Excl. in GC? --    Constitutional: Awake and alert. Well appearing and in no acute distress. Eyes: Conjunctivae are injected. PERRL. Head: Atraumatic. Nose: No congestion/rhinnorhea. Mouth/Throat: Mucous membranes are moist. Neck: No stridor Cardiovascular: Grossly normal heart sounds.  Good peripheral circulation. Respiratory: Increased respiratory rate with trach in place.  Lungs with mild rhonchi over left lung fields Gastrointestinal: Soft and no guarding with palpation. No distention. Musculoskeletal: No obvious deformities Neurologic: Patient moving all extremities spontaneously.  Patient opens eyes and tracks movements Skin:  Skin is warm and dry.  Sacral ulcer noted.  No rash noted. Psychiatric: Cooperative  ____________________________________________   LABS (all labs ordered are listed, but only abnormal results are displayed)  Labs Reviewed  LACTIC ACID, PLASMA - Abnormal; Notable for the following components:      Result Value   Lactic Acid, Venous 3.4 (*)    All other components within normal limits  LACTIC ACID, PLASMA - Abnormal; Notable for the following components:   Lactic Acid, Venous 3.2 (*)    All other components within normal limits  COMPREHENSIVE METABOLIC PANEL - Abnormal; Notable for the following components:   Chloride 92 (*)    CO2 37 (*)    Glucose, Bld 133 (*)    Total Protein 6.4 (*)    Albumin 3.3 (*)    AST 73 (*)    ALT 46 (*)    Alkaline Phosphatase 37 (*)    All other components within normal limits  CBC WITH DIFFERENTIAL/PLATELET - Abnormal; Notable for the following  components:   RBC 3.21 (*)    Hemoglobin 9.6 (*)    HCT 33.6 (*)    MCV 104.7 (*)    MCHC 28.6 (*)    Abs Immature Granulocytes 0.16 (*)    All other components within normal limits  URINALYSIS, COMPLETE (UACMP) WITH MICROSCOPIC - Abnormal; Notable for the following components:   Color,  Urine YELLOW (*)    APPearance HAZY (*)    Hgb urine dipstick MODERATE (*)    Ketones, ur 5 (*)    Protein, ur 100 (*)    All other components within normal limits  BRAIN NATRIURETIC PEPTIDE - Abnormal; Notable for the following components:   B Natriuretic Peptide 670.7 (*)    All other components within normal limits  BLOOD GAS, VENOUS - Abnormal; Notable for the following components:   pH, Ven 7.15 (*)    pCO2, Ven 118 (*)    Bicarbonate 41.1 (*)    Acid-Base Excess 9.2 (*)    All other components within normal limits  TROPONIN I (HIGH SENSITIVITY) - Abnormal; Notable for the following components:   Troponin I (High Sensitivity) 23 (*)    All other components within normal limits  TROPONIN I (HIGH SENSITIVITY) - Abnormal; Notable for the following components:   Troponin I (High Sensitivity) 24 (*)    All other components within normal limits  RESP PANEL BY RT-PCR (FLU A&B, COVID) ARPGX2  CULTURE, BLOOD (ROUTINE X 2)  CULTURE, BLOOD (ROUTINE X 2)  URINE CULTURE  PROTIME-INR  APTT  BLOOD GAS, ARTERIAL   ____________________________________________  EKG  ED ECG REPORT I, Merwyn Katos, the attending physician, personally viewed and interpreted this ECG.  Date: 03/20/2021 EKG Time: 1515 Rate: 78 Rhythm: normal sinus rhythm QRS Axis: normal Intervals: normal ST/T Wave abnormalities: normal Narrative Interpretation: no evidence of acute ischemia  ____________________________________________  RADIOLOGY  ED MD interpretation: CT of the head without contrast shows no evidence of acute abnormalities including no intracerebral hemorrhage, obvious masses, or significant  edema  One-view portable x-ray of the chest shows trace bilateral pleural effusions  Official radiology report(s): CT Head Wo Contrast  Result Date: 03/20/2021 CLINICAL DATA:  Mental status change unresponsive EXAM: CT HEAD WITHOUT CONTRAST TECHNIQUE: Contiguous axial images were obtained from the base of the skull through the vertex without intravenous contrast. COMPARISON:  CT brain 02/18/2021 FINDINGS: Brain: No acute territorial infarction, hemorrhage or intracranial mass. The ventricles are nonenlarged. Minimal chronic small vessel ischemic change of white matter. Vascular: No hyperdense vessels.  No unexpected calcification Skull: Normal. Negative for fracture or focal lesion. Sinuses/Orbits: No acute finding. Other: None IMPRESSION: Negative.  No CT evidence for acute intracranial abnormality Electronically Signed   By: Jasmine Pang M.D.   On: 03/20/2021 19:29   DG Chest Port 1 View  Result Date: 03/20/2021 CLINICAL DATA:  Questionable sepsis skin chest x-ray 02/18/2021, CT chest 02/26/2021 EXAM: PORTABLE CHEST 1 VIEW.  Patient is rotated. COMPARISON:  Chest x-ray 02/18/2021 FINDINGS: Tracheostomy. The heart and mediastinal contours are unchanged. Aortic calcification. Hyperinflation. Right mid lung zone airspace opacity likely due to patient rotation. No focal consolidation. No pulmonary edema. Blunting of bilateral costophrenic angles with likely trace pleural effusions bilaterally. No pneumothorax. No acute osseous abnormality. Total reverse right shoulder arthroplasty. Breast implants are noted. IMPRESSION: 1. Trace bilateral pleural effusions. 2. Aortic Atherosclerosis (ICD10-I70.0) and Emphysema (ICD10-J43.9). Electronically Signed   By: Tish Frederickson M.D.   On: 03/20/2021 15:52    ____________________________________________   PROCEDURES  Procedure(s) performed (including Critical Care):  .1-3 Lead EKG Interpretation Performed by: Merwyn Katos, MD Authorized by: Merwyn Katos, MD     Interpretation: normal     ECG rate:  77   ECG rate assessment: normal     Rhythm: sinus rhythm     Ectopy: none     Conduction: normal  CRITICAL CARE Performed by: Merwyn Katos   Total critical care time: 57 minutes  Critical care time was exclusive of separately billable procedures and treating other patients.  Critical care was necessary to treat or prevent imminent or life-threatening deterioration.  Critical care was time spent personally by me on the following activities: development of treatment plan with patient and/or surrogate as well as nursing, discussions with consultants, evaluation of patient's response to treatment, examination of patient, obtaining history from patient or surrogate, ordering and performing treatments and interventions, ordering and review of laboratory studies, ordering and review of radiographic studies, pulse oximetry and re-evaluation of patient's condition.  ____________________________________________   INITIAL IMPRESSION / ASSESSMENT AND PLAN / ED COURSE  As part of my medical decision making, I reviewed the following data within the electronic medical record, if available:  Nursing notes reviewed and incorporated, Labs reviewed, EKG interpreted, Old chart reviewed, Radiograph reviewed and Notes from prior ED visits reviewed and incorporated     Patient presents for hypoxia and altered mental status.  Differential diagnosis for this patient includes but is not limited to: Sepsis, pneumonia, CHF, pulmonary edema, PE, ACS, CVA  Patient placed on vent through her trach collar upon arrival with improvement of patient's oxygenation as well as mental status. While inserting Foley catheter, patient was noted to have dark tarry stools that were Hemoccult positive Patient's VBG concerning for pH of 7.15 with CO2 of 118 Patient initial troponin of 23 that only increased slightly to 24 Patient initial lactate 3.2, IV fluids ordered  and sepsis order set used with empiric antibiotics Despite fluid resuscitation, patient to maintain a MAP lower than 75 and therefore was started on a Levophed drip  I spoke to providers in ICU who agreed to accept this patient onto their service.  Dispo: Admit to ICU     ____________________________________________   FINAL CLINICAL IMPRESSION(S) / ED DIAGNOSES  Final diagnoses:  Transient alteration of awareness  Acute respiratory failure with hypoxia and hypercapnia (HCC)  Acute upper GI bleeding  Chronic bilateral pleural effusions     ED Discharge Orders     None        Note:  This document was prepared using Dragon voice recognition software and may include unintentional dictation errors.    Merwyn Katos, MD 03/20/21 2026

## 2021-03-20 NOTE — H&P (Addendum)
NAME:  Virginia [name], MRN:  [id number], DOB:  04/23/1951, LOS: 0 ADMISSION DATE:  03/20/2021, CONSULTATION DATE: 03/20/2021 REFERRING MD: Dr. Vicente Males, CHIEF COMPLAINT: Unresponsiveness   History of Present Illness:  This is a 70 yo female with end stage COPD and chronic tracheostomy who presented to [facility] ER via EMS from home on 09/27 due to unresponsiveness.  Per ER notes EMS reported pts initial GCS was 3 and O2 sats were unreadable.  EMS provided bag ventilation via tracheostomy en route to the ER.  Pt also received 2 mg of narcan and initially became responsive.  Pts son informed EMS on the scene pts code status DNR, however there was no DNR paperwork readily available on the scene.  Pt recently hospitalized at [facility] from 08/28~09/19 following treatment of acute on chronic hypoxic hypercapnic respiratory failure secondary to AECOPD and enterobacter aerogenes bronchitis.    ED course Upon arrival to the ER pt unresponsive to verbal/painful stimulation with O2 sats initially in the mid 90's on RA.  However, pt immediately became hypoxic with O2 sats decreasing to 60% with shallow respirations on RA, requiring bag ventilation via tracheostomy.  Due to continued hypoxia pts #6 cuffless tracheostomy was changed to a #6 cuffed trach, and pt placed on mechanical ventilation via trach with O2 sats increasing to 92%.  Upon removal of cuffless trach RT noted pts inner canula was partially occluded by dried secretions.  VBG revealed pH 7.15/pCO2 118/acid-base excess 9.2/bicarb 41.1.  CXR concerning for bilateral trace pleural effusions and emphysema.  Code sepsis activated by ER physician pt received 1L LR bolus, ceftriaxone, azithromycin, and vancomycin due to concern of pneumonia.  PCCM team contacted for ICU admission.   Pertinent  Medical History  Atrial Fibrillation on Eliquis C. Difficile Severe COPD Chronic tracheostomy Anxiety & depression Hepatitis  C Arthritis HTN Fibromyalgia Anemia CAD Thoracic AAA  Significant Hospital Events: Including procedures, antibiotic start and stop dates in addition to other pertinent events   09/27: Pt admitted to ICU with acute on chronic hypoxic hypercapnic respiratory failure secondary to end stage COPD requiring mechanical ventilation via chronic tracheostomy   Interim History / Subjective:  Pt mechanically ventilated via tracheostomy awake and following commands.  Objective   Blood pressure (!) 119/107, pulse 78, resp. rate 18, weight 54 kg, SpO2 93 %.       No intake or output data in the 24 hours ending 03/20/21 1532 Filed Weights   03/20/21 1528  Weight: 54 kg   Examination: General: chronically ill appearing frail female, NAD mechanically ventilated via chronic tracheostomy HENT: #6 [place]  midline chronic tracheostomy, no JVD  Lungs: diminished throughout, even, non labored  Cardiovascular: sinus rhythm, no R/G, 2+ radial/1+ distal pulses trace bilateral lower extremity edema  Abdomen: +BS x4, soft, non tender, non distended  Extremities: normal tone, no edema  Neuro: awake, following commands, PERRL GU: indwelling catheter in place draining clear/yellow urine   Resolved Hospital Problem list     Assessment & Plan:  Acute on chronic hypoxic hypercapnic respiratory failure secondary to AECOPD and mucous plugging ?& Probable underlying pneumonia Mechanical ventilation via chronic tracheostomy  Hx: Chronic Home O2 @4 .5~5L during the day and trilogy with capped tracheostomy qhs - Continue mechanical ventilation overnight, wean FiO2 as tolerated - Supplemental O2 to maintain SpO2 > 90% - Intermittent chest x-ray & ABG PRN, f/u ABG in AM - Ensure adequate pulmonary hygiene  - Check Strep Pneumo and Legionella - tracheal aspirate sent, trend PCT, daily  CBC- monitor WBC/fever curve - Baseline PCT pending, will continue with empiric abx coverage if elevated - solu-medrol 40 mg  BID - budesonide nebs BID, duo-neb scheduled Q 6, bronchodilators PRN  Mildly elevated troponin likely secondary to demand ischemia  Chronic atrial fibrillation on Eliquis Hx: HTN and CAD  - Continuous telemetry monitoring  - Trend troponin's  - Hold Eliquis in the setting of GI bleed - Hold BP meds in the setting of hypotension  Lactic acidosis -Trend BMP and lactic acid   Elevated liver enzymes likely secondary to hepatitis C hx  -Trend hepatic function panel   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  Transfuse PRN Hgb<7 - Pantoprazole 40mg  IV BID - NPO for pending endoscopy - GI Consult for EGD/Colonoscopy - Hold NSAIDs, Anticoagulation ASA  Acute encephalopathy secondary to hypercapnia  Hx: Anxiety and depression  - Maintain RASS goal 0 to -1 - Will minimize sedating medications for now  - Hold outpatient amitriptyline, diazepam, gabapentin, and seroquel for now  - Urine drug screen, ethanol level, salicylate level, and acetaminophen level pending  - CT head negative for acute intracranial abnormality  Best Practice (right click and "Reselect all SmartList Selections" daily)   Diet/type: NPO DVT prophylaxis: prophylactic heparin  GI prophylaxis: PPI Lines: N/A Foley:  N/A Code Status:  full code Last date of multidisciplinary goals of care discussion [N/A]  Labs   CBC: No results for input(s): WBC, NEUTROABS, HGB, HCT, MCV, PLT in the last 168 hours.  Basic Metabolic Panel: No results for input(s): NA, K, CL, CO2, GLUCOSE, BUN, CREATININE, CALCIUM, MG, PHOS in the last 168 hours. GFR: Estimated Creatinine Clearance: 47 mL/min (by C-G formula based on SCr of 0.56 mg/dL). No results for  input(s): PROCALCITON, WBC, LATICACIDVEN in the last 168 hours.  Liver Function Tests: No results for input(s): AST, ALT, ALKPHOS, BILITOT, PROT, ALBUMIN in the last 168 hours. No results for input(s): LIPASE, AMYLASE in the last 168 hours. No results for input(s): AMMONIA in the last 168 hours.  ABG    Component Value Date/Time   PHART 7.56 (H) 02/19/2021 0500   PCO2ART 46 02/19/2021 0500   PO2ART 88 02/19/2021 0500   HCO3 41.1 (H) 03/20/2021 1517   O2SAT 50.9 03/20/2021 1517     Coagulation Profile: No results for input(s): INR, PROTIME in the last 168 hours.  Cardiac Enzymes: No results for input(s): CKTOTAL, CKMB, CKMBINDEX, TROPONINI in the last 168 hours.  HbA1C: Hgb A1c MFr Bld  Date/Time Value Ref Range Status  01/08/2021 06:28 AM 4.9 4.8 - 5.6 % Final    Comment:    (NOTE) Pre diabetes:          5.7%-6.4%  Diabetes:              >6.4%  Glycemic control for   <7.0% adults with diabetes   06/06/2020 12:07 PM 5.3 4.8 - 5.6 % Final    Comment:    (NOTE) Pre diabetes:          5.7%-6.4%  Diabetes:              >6.4%  Glycemic control for   <7.0% adults with diabetes     CBG: No results for input(s): GLUCAP in the last 168  hours.  Review of Systems:   Unable to assess pt unresponsive and mechanically ventilated via chronic tracheostomy   Past Medical History:  She,  has a past medical history of Anemia, Anxiety, Arthritis, Atrial fibrillation (HCC), C. difficile colitis (09/2015), COPD (chronic obstructive pulmonary disease) (HCC), Depression, Dyspnea, Dysrhythmia, Fibromyalgia, H/O tracheostomy, Hep C w/ coma, chronic, Hypertension, MRSA pneumonia (HCC) (2017), On home oxygen therapy, Osteoporosis, Peripheral neuropathy, RLS (restless legs syndrome), S/P percutaneous endoscopic gastrostomy (PEG) tube placement (HCC) (09/2015), and Ventilator associated pneumonia (HCC) (10/2015).   Surgical History:   Past Surgical History:  Procedure Laterality Date    ABDOMINAL HYSTERECTOMY     BREAST SURGERY Bilateral    Breast Implants   DILATION AND CURETTAGE OF UTERUS     PEG PLACEMENT N/A 06/21/2020   Procedure: PERCUTANEOUS ENDOSCOPIC GASTROSTOMY (PEG) PLACEMENT;  Surgeon: Regis Bill, MD;  Location: [facility] ENDOSCOPY;  Service: Endoscopy;  Laterality: N/A;   REVERSE SHOULDER ARTHROPLASTY Right 03/06/2017   Procedure: REVERSE SHOULDER ARTHROPLASTY;  Surgeon: Christena Flake, MD;  Location: [facility] ORS;  Service: Orthopedics;  Laterality: Right;   ROTATOR CUFF REPAIR Bilateral    TRACHEOSTOMY TUBE PLACEMENT N/A 06/15/2020   Procedure: TRACHEOSTOMY;  Surgeon: Geanie Logan, MD;  Location: [facility] ORS;  Service: ENT;  Laterality: N/A;     Social History:   reports that she quit smoking about 5 years ago. Her smoking use included cigarettes. She smoked an average of 1.5 packs per day. She has never used smokeless tobacco. She reports that she does not drink alcohol and does not use drugs.   Family History:  Her family history includes Hypertension in her mother.   Allergies No Known Allergies   Home Medications  Prior to Admission medications   Medication Sig Start Date End Date Taking? Authorizing Provider  albuterol (VENTOLIN HFA) 108 (90 Base) MCG/ACT inhaler Inhale 2 puffs into the lungs every 4 (four) hours as needed for wheezing or shortness of breath.    [provider]  amitriptyline (ELAVIL) 25 MG tablet Take 25 mg by mouth at bedtime.  01/14/19   [provider]  apixaban (ELIQUIS) 5 MG TABS tablet Take 1 tablet (5 mg total) by mouth 2 (two) times daily. 11/10/16   Adrian Saran, MD  budesonide (PULMICORT) 0.5 MG/2ML nebulizer solution Take 0.5 mg by nebulization daily. 11/28/20   [provider]  diazepam (VALIUM) 5 MG tablet Take 1-1.5 tablets (5-7.5 mg total) by mouth every 8 (eight) hours as needed for anxiety. 03/12/21   Osvaldo Shipper, MD  diltiazem (CARDIZEM CD) 120 MG 24 hr capsule Take 120 mg by mouth daily.  04/29/19   [provider]  estradiol (ESTRACE) 1 MG tablet Take 1 mg by mouth daily. 09/10/16   [provider]  gabapentin (NEURONTIN) 300 MG capsule Take 300 mg by mouth 3 (three) times daily. 05/06/20   [provider]  ipratropium-albuterol (DUONEB) 0.5-2.5 (3) MG/3ML SOLN Take 3 mLs by nebulization every 6 (six) hours. 02/23/21   Salena Saner, MD  iron polysaccharides (NIFEREX) 150 MG capsule Take 1 capsule (150 mg total) by mouth daily. 01/12/21 04/12/21  Gillis Santa, MD  MAGNESIUM-OXIDE 400 (241.3 Mg) MG tablet Take 400 mg by mouth daily.  08/24/16   [provider]  methocarbamol (ROBAXIN) 500 MG tablet Take 500 mg by mouth 2 (two) times daily. 12/27/20   [provider]  metoprolol tartrate (LOPRESSOR) 25 MG tablet Take 1 tablet (25 mg total) by mouth 2 (two) times  daily. 03/12/21 06/10/21  Osvaldo Shipper, MD  omeprazole (PRILOSEC) 40 MG capsule Take 40 mg by mouth daily. 04/16/19   [provider]  predniSONE (DELTASONE) 5 MG tablet Take 5 mg by mouth daily. 12/25/20   [provider]  QUEtiapine (SEROQUEL) 25 MG tablet Take 25 mg by mouth at bedtime.  10/01/16   [provider]  rosuvastatin (CRESTOR) 10 MG tablet Take 10 mg by mouth at bedtime. 10/23/20   [provider]  vitamin B-12 100 MCG tablet Take 1 tablet (100 mcg total) by mouth daily. 01/12/21 04/12/21  Gillis Santa, MD  Vitamin D, Ergocalciferol, (DRISDOL) 1.25 MG (50000 UNIT) CAPS capsule Take 1 capsule (50,000 Units total) by mouth every 7 (seven) days. 01/17/21 04/17/21  Gillis Santa, MD     Critical care time: 40 minutes        Webb Silversmith, DNP, CCRN, FNP-C, AGACNP-BC Acute Care Nurse Practitioner  [facility] Pager: [phone] [facility]

## 2021-03-21 ENCOUNTER — Inpatient Hospital Stay: Payer: Medicare Other

## 2021-03-21 DIAGNOSIS — J9602 Acute respiratory failure with hypercapnia: Secondary | ICD-10-CM | POA: Diagnosis not present

## 2021-03-21 DIAGNOSIS — R404 Transient alteration of awareness: Secondary | ICD-10-CM | POA: Diagnosis not present

## 2021-03-21 DIAGNOSIS — J90 Pleural effusion, not elsewhere classified: Secondary | ICD-10-CM | POA: Diagnosis not present

## 2021-03-21 DIAGNOSIS — J9601 Acute respiratory failure with hypoxia: Secondary | ICD-10-CM | POA: Diagnosis not present

## 2021-03-21 LAB — BLOOD GAS, ARTERIAL
Acid-Base Excess: 7.4 mmol/L — ABNORMAL HIGH (ref 0.0–2.0)
Bicarbonate: 31.2 mmol/L — ABNORMAL HIGH (ref 20.0–28.0)
FIO2: 0.36
MECHVT: 450 mL
Mechanical Rate: 18
O2 Saturation: 99 %
PEEP: 5 cmH2O
Patient temperature: 37
RATE: 18 resp/min
pCO2 arterial: 40 mmHg (ref 32.0–48.0)
pH, Arterial: 7.5 — ABNORMAL HIGH (ref 7.350–7.450)
pO2, Arterial: 120 mmHg — ABNORMAL HIGH (ref 83.0–108.0)

## 2021-03-21 LAB — URINE DRUG SCREEN, QUALITATIVE (ARMC ONLY)
Amphetamines, Ur Screen: NOT DETECTED
Barbiturates, Ur Screen: NOT DETECTED
Benzodiazepine, Ur Scrn: POSITIVE — AB
Cannabinoid 50 Ng, Ur ~~LOC~~: NOT DETECTED
Cocaine Metabolite,Ur ~~LOC~~: NOT DETECTED
MDMA (Ecstasy)Ur Screen: NOT DETECTED
Methadone Scn, Ur: NOT DETECTED
Opiate, Ur Screen: NOT DETECTED
Phencyclidine (PCP) Ur S: NOT DETECTED
Tricyclic, Ur Screen: NOT DETECTED

## 2021-03-21 LAB — URINE CULTURE: Culture: <10000 — AB

## 2021-03-21 LAB — BASIC METABOLIC PANEL
Anion gap: 18 — ABNORMAL HIGH (ref 5–15)
BUN: 15 mg/dL (ref 8–23)
CO2: 29 mmol/L (ref 22–32)
Calcium: 8.7 mg/dL — ABNORMAL LOW (ref 8.9–10.3)
Chloride: 95 mmol/L — ABNORMAL LOW (ref 98–111)
Creatinine, Ser: 0.96 mg/dL (ref 0.44–1.00)
GFR, Estimated: >60 mL/min (ref >60)
Glucose, Bld: 135 mg/dL — ABNORMAL HIGH (ref 70–99)
Potassium: 3.8 mmol/L (ref 3.5–5.1)
Sodium: 142 mmol/L (ref 135–145)

## 2021-03-21 LAB — CBC
HCT: 26.8 % — ABNORMAL LOW (ref 36.0–46.0)
Hemoglobin: 8.2 g/dL — ABNORMAL LOW (ref 12.0–15.0)
MCH: 30.8 pg (ref 26.0–34.0)
MCHC: 30.6 g/dL (ref 30.0–36.0)
MCV: 100.8 fL — ABNORMAL HIGH (ref 80.0–100.0)
Platelets: 333 10*3/uL (ref 150–400)
RBC: 2.66 MIL/uL — ABNORMAL LOW (ref 3.87–5.11)
RDW: 14.5 % (ref 11.5–15.5)
WBC: 7.3 10*3/uL (ref 4.0–10.5)
nRBC: 0.3 % — ABNORMAL HIGH (ref 0.0–0.2)

## 2021-03-21 LAB — CORTISOL-AM, BLOOD: Cortisol - AM: 17.9 ug/dL (ref 6.7–22.6)

## 2021-03-21 LAB — LACTIC ACID, PLASMA
Lactic Acid, Venous: 2.3 mmol/L (ref 0.5–1.9)
Lactic Acid, Venous: 1.1 mmol/L (ref 0.5–1.9)

## 2021-03-21 LAB — STREP PNEUMONIAE URINARY ANTIGEN: Strep Pneumo Urinary Antigen: NEGATIVE

## 2021-03-21 LAB — HEMOGLOBIN AND HEMATOCRIT, BLOOD
HCT: 26.8 % — ABNORMAL LOW (ref 36.0–46.0)
HCT: 25.8 % — ABNORMAL LOW (ref 36.0–46.0)
HCT: 25.2 % — ABNORMAL LOW (ref 36.0–46.0)
Hemoglobin: 8.4 g/dL — ABNORMAL LOW (ref 12.0–15.0)
Hemoglobin: 8.2 g/dL — ABNORMAL LOW (ref 12.0–15.0)
Hemoglobin: 7.9 g/dL — ABNORMAL LOW (ref 12.0–15.0)

## 2021-03-21 LAB — GLUCOSE, CAPILLARY
Glucose-Capillary: 111 mg/dL — ABNORMAL HIGH (ref 70–99)
Glucose-Capillary: 152 mg/dL — ABNORMAL HIGH (ref 70–99)
Glucose-Capillary: 148 mg/dL — ABNORMAL HIGH (ref 70–99)
Glucose-Capillary: 175 mg/dL — ABNORMAL HIGH (ref 70–99)
Glucose-Capillary: 158 mg/dL — ABNORMAL HIGH (ref 70–99)

## 2021-03-21 LAB — MAGNESIUM
Magnesium: 1.4 mg/dL — ABNORMAL LOW (ref 1.7–2.4)
Magnesium: 2 mg/dL (ref 1.7–2.4)

## 2021-03-21 LAB — T4, FREE: Free T4: 0.8 ng/dL (ref 0.61–1.12)

## 2021-03-21 LAB — PHOSPHORUS
Phosphorus: 1.2 mg/dL — ABNORMAL LOW (ref 2.5–4.6)
Phosphorus: 5 mg/dL — ABNORMAL HIGH (ref 2.5–4.6)

## 2021-03-21 LAB — PROCALCITONIN: Procalcitonin: 0.64 ng/mL

## 2021-03-21 LAB — TSH: TSH: 0.192 u[IU]/mL — ABNORMAL LOW (ref 0.350–4.500)

## 2021-03-21 MED ORDER — SODIUM CHLORIDE 0.9 % IV SOLN: 250.0000 mL | INTRAVENOUS | Status: DC

## 2021-03-21 MED ORDER — NOREPINEPHRINE 4 MG/250ML-% IV SOLN: 2.0000 ug/min | INTRAVENOUS | Status: DC

## 2021-03-21 MED ORDER — METHYLPREDNISOLONE SODIUM SUCC 40 MG IJ SOLR
40.0000 mg | INTRAMUSCULAR | Status: DC
  Administered 2021-03-21: 40 mg via INTRAVENOUS
  Filled 2021-03-21: qty 1

## 2021-03-21 MED ORDER — POTASSIUM PHOSPHATES 15 MMOLE/5ML IV SOLN
45.0000 mmol | Freq: Once | INTRAVENOUS | Status: AC
  Administered 2021-03-21: 45 mmol via INTRAVENOUS
  Filled 2021-03-21: qty 15

## 2021-03-21 MED ORDER — DIAZEPAM 5 MG/ML IJ SOLN
2.5000 mg | Freq: Four times a day (QID) | INTRAMUSCULAR | Status: DC | PRN
  Administered 2021-03-21: 5 mg via INTRAVENOUS
  Administered 2021-03-21: 2.5 mg via INTRAVENOUS
  Administered 2021-03-21 – 2021-03-25 (×10): 5 mg via INTRAVENOUS
  Filled 2021-03-21 (×12): qty 2

## 2021-03-21 MED ORDER — WHITE PETROLATUM EX OINT
TOPICAL_OINTMENT | CUTANEOUS | Status: DC | PRN
  Filled 2021-03-21 (×2): qty 5

## 2021-03-21 MED ORDER — SODIUM CHLORIDE 0.9 % IV SOLN
1.0000 g | INTRAVENOUS | Status: DC
  Filled 2021-03-21: qty 10

## 2021-03-21 MED ORDER — METOPROLOL TARTRATE 5 MG/5ML IV SOLN
2.5000 mg | Freq: Four times a day (QID) | INTRAVENOUS | Status: DC | PRN
  Administered 2021-03-29 – 2021-04-02 (×5): 5 mg via INTRAVENOUS
  Filled 2021-03-21 (×5): qty 5

## 2021-03-21 MED ORDER — METOPROLOL TARTRATE 5 MG/5ML IV SOLN
INTRAVENOUS | Status: AC
  Filled 2021-03-21: qty 5

## 2021-03-21 MED ORDER — MAGNESIUM SULFATE 2 GM/50ML IV SOLN
2.0000 g | Freq: Once | INTRAVENOUS | Status: AC
  Administered 2021-03-21: 2 g via INTRAVENOUS
  Filled 2021-03-21: qty 50

## 2021-03-21 MED ORDER — METOPROLOL TARTRATE 5 MG/5ML IV SOLN
5.0000 mg | INTRAVENOUS | Status: AC
  Administered 2021-03-21: 5 mg via INTRAVENOUS

## 2021-03-21 MED ORDER — SODIUM CHLORIDE 0.9 % IV SOLN
500.0000 mg | INTRAVENOUS | Status: DC
  Filled 2021-03-21: qty 500

## 2021-03-21 NOTE — Progress Notes (Signed)
PHARMACY CONSULT NOTE - FOLLOW UP  Pharmacy Consult for Electrolyte Monitoring and Replacement   Recent Labs: Potassium (mmol/L)  Date Value  03/21/2021 3.8   Magnesium (mg/dL)  Date Value  58/83/2549 1.4 (L)   Calcium (mg/dL)  Date Value  82/64/1583 8.7 (L)   Albumin (g/dL)  Date Value  09/40/7680 3.0 (L)   Phosphorus (mg/dL)  Date Value  88/04/314 1.2 (L)   Sodium (mmol/L)  Date Value  03/21/2021 142     Assessment: 70 year old female presented to the ED unresponsive. Patient required mechanical ventilation via chronic trach. Respiratory failure s/t end stage COPD. Patient with dark tarry stools that were hemoccult positive in the ED, concerning for GIB. Pharmacy consult to manage electrolytes.  Goal of Therapy:  Electrolytes WNL  Plan:  --K phos 45 mmol --Mag 2 g --Follow up with morning labs  Pricilla Riffle, PharmD, BCPS Clinical Pharmacist 03/21/2021 1:31 PM

## 2021-03-21 NOTE — NC FL2 (Signed)
[place] MEDICAID FL2 LEVEL OF CARE SCREENING TOOL     IDENTIFICATION  Patient Name: Virginia [name] Birthdate: 1950/08/15 Sex: female Admission Date (Current Location): 03/20/2021  [facility] and [place] Number:  [address] [id number] [facility] Facility and Address:  [facility], [address] [place], [place] [address]      Provider Number: [id number]  Attending Physician Name and Address:  Erin Fulling, MD  Relative Name and Phone Number:  [name], [name] (Son)   [phone]    Current Level of Care: Hospital Recommended Level of Care: Skilled Nursing Facility Prior Approval Number:    Date Approved/Denied:   PASRR Number: [id number]  Discharge Plan: SNF    Current Diagnoses: Patient Active Problem List   Diagnosis Date Noted   Acute on chronic respiratory failure with hypoxemia (HCC) 03/20/2021   Pneumonia due to enterobacter aerogenes (HCC) 02/23/2021   Acute respiratory failure (HCC) 02/18/2021   SVT (supraventricular tachycardia) (HCC) 02/18/2021   Malnutrition of moderate degree 01/08/2021   On mechanically assisted ventilation (HCC) 01/07/2021   Hyperlipidemia 01/07/2021   Anxiety 01/07/2021   Acute on chronic respiratory failure (HCC) 01/06/2021   Anorexia 08/04/2020   Severe protein-calorie malnutrition (HCC) 08/04/2020   Pressure injury of skin 07/14/2020   Diarrhea    Reactive thrombocytosis 07/12/2020   Aspiration pneumonia of both lower lobes due to gastric secretions (HCC) 07/07/2020   Acute urinary retention 07/06/2020   Acute metabolic encephalopathy 07/06/2020   Acute on chronic respiratory failure with hypoxia and hypercapnia (HCC) 07/06/2020   COPD exacerbation (HCC)    Respiratory failure (HCC) 06/06/2020   Acute respiratory failure with hypoxia (HCC) 06/02/2020   COPD with acute exacerbation (HCC) 06/02/2020   Shortness of breath 06/02/2020   GERD (gastroesophageal reflux disease) 06/02/2020   Iron deficiency anemia  01/22/2020   Anemia of chronic disease 01/17/2019   Coronary artery disease involving native coronary artery of native heart 04/30/2018   Palpitations 04/30/2018   Chronic heartburn 04/12/2018   Dysphagia 04/12/2018   Thoracic aortic aneurysm without rupture (HCC) 08/27/2017   Status post reverse total shoulder replacement, 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   SOBOE (shortness of breath on exertion) 01/24/2017   AF (paroxysmal atrial fibrillation) (HCC) 11/09/2016   Chronic hypoxemic respiratory failure (HCC) 12/06/2015   Depression 12/06/2015   Fibromyalgia 12/06/2015   Hep C w/o coma, chronic (HCC) 12/06/2015   History of tracheostomy 12/06/2015   Hypertension 12/06/2015   Localized osteoporosis without current pathological fracture 12/06/2015   PEG (percutaneous endoscopic gastrostomy) status (HCC) 12/06/2015   Peripheral neuropathy, idiopathic 12/06/2015   RLS (restless legs syndrome) 12/06/2015   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   COPD with acute lower respiratory infection (HCC) 11/29/2015    Orientation RESPIRATION BLADDER Height & Weight     Self, Time, Place, Situation  Tracheostomy, Vent, O2 (Bi-Pap at night, when medically stable for discharge.) External catheter Weight: 51.3 kg Height:  5' (152.4 cm)  BEHAVIORAL SYMPTOMS/MOOD NEUROLOGICAL BOWEL NUTRITION STATUS      Continent Diet  AMBULATORY STATUS COMMUNICATION OF NEEDS Skin   Extensive Assist Verbally                         Personal Care Assistance Level of Assistance    Bathing Assistance: Limited assistance Feeding assistance: Limited assistance Dressing Assistance: Limited assistance     Functional Limitations Info  Sight, Hearing, Speech Sight Info: Adequate Hearing Info: Adequate Speech Info: Adequate    SPECIAL CARE FACTORS FREQUENCY  PT (By licensed PT), OT (By licensed OT)     PT Frequency: 5X WEEK OT Frequency: 5X WEEK            Contractures Contractures Info: Not present     Additional Factors Info  Code Status, Allergies Code Status Info: FULL Allergies Info: NKDA           Current Medications (03/21/2021):  This is the current hospital active medication list Current Facility-Administered Medications  Medication Dose Route Frequency Provider Last Rate Last Admin   0.9 %  sodium chloride infusion  250 mL Intravenous Continuous Ouma, Hubbard Hartshorn, NP       budesonide (PULMICORT) nebulizer solution 0.25 mg  0.25 mg Nebulization BID Jimmye Norman, NP   0.25 mg at 03/21/21 0741   chlorhexidine (PERIDEX) 0.12 % solution 15 mL  15 mL Mouth Rinse BID Erin Fulling, MD   15 mL at 03/21/21 0933   Chlorhexidine Gluconate Cloth 2 % PADS 6 each  6 each Topical [id number] Erin Fulling, MD   6 each at 03/20/21 2220   diazepam (VALIUM) injection 2.5-5 mg  2.5-5 mg Intravenous Q6H PRN Lianne Cure, NP   2.5 mg at 03/21/21 0837   docusate (COLACE) 50 MG/5ML liquid 100 mg  100 mg Per Tube BID Jimmye Norman, NP       docusate sodium (COLACE) capsule 100 mg  100 mg Oral BID PRN Jimmye Norman, NP       fentaNYL in NS (27mcg/ml) infusion-PREMIX  25-200 mcg/hr Intravenous Continuous Jimmye Norman, NP 2.5 mL/hr at 03/21/21 0900 25 mcg/hr at 03/21/21 0900   ipratropium-albuterol (DUONEB) 0.5-2.5 (3) MG/3ML nebulizer solution 3 mL  3 mL Nebulization Q6H Jimmye Norman, NP   3 mL at 03/21/21 0741   ipratropium-albuterol (DUONEB) 0.5-2.5 (3) MG/3ML nebulizer solution 3 mL  3 mL Nebulization Q6H PRN Jimmye Norman, NP       lactated ringers infusion   Intravenous Continuous Lianne Cure, NP 150 mL/hr at 03/21/21 0406 New Bag at 03/21/21 0406   MEDLINE mouth rinse  15 mL Mouth Rinse q12n4p Erin Fulling, MD       methylPREDNISolone sodium succinate (SOLU-MEDROL) 40 mg/mL injection 40 mg  40 mg Intravenous Q24H Erin Fulling, MD       metoprolol tartrate (LOPRESSOR) 5 MG/5ML injection            metoprolol tartrate  (LOPRESSOR) injection 2.5-5 mg  2.5-5 mg Intravenous Q6H PRN Lianne Cure, NP       morphine 2 MG/ML injection 2 mg  2 mg Intravenous Q1H PRN Merwyn Katos, MD   2 mg at 03/20/21 1756   norepinephrine (LEVOPHED) 4mg  in premix infusion  2-10 mcg/min Intravenous Titrated , NP       pantoprazole (PROTONIX) injection 40 mg  40 mg Intravenous Q12H Jimmye Norman, NP   40 mg at 03/21/21 0932   polyethylene glycol (MIRALAX / GLYCOLAX) packet 17 g  17 g Per Tube Daily Ouma, 03/23/21, NP       potassium PHOSPHATE 45 mmol in dextrose 5 % 500 mL infusion  45 mmol Intravenous Once Hubbard Hartshorn, NP 86 mL/hr at 03/21/21 0936 45 mmol at 03/21/21 03/23/21     Discharge Medications: Please see discharge summary for a list of discharge medications.  Relevant Imaging Results:  Relevant Lab Results:   Additional Information SS# [id number]  [id number], RN

## 2021-03-21 NOTE — Progress Notes (Signed)
NAME:  Virginia [name], MRN:  [id number], DOB:  07/22/1950, LOS: 1 ADMISSION DATE:  03/20/2021, CONSULTATION DATE: 03/20/2021 REFERRING MD: Dr. Vicente Males, CHIEF COMPLAINT: Unresponsiveness   History of Present Illness:  This is a 70 yo female with end stage COPD and chronic tracheostomy who presented to [facility] ER via EMS from home on 09/27 due to unresponsiveness.  Per ER notes EMS reported pts initial GCS was 3 and O2 sats were unreadable.  EMS provided bag ventilation via tracheostomy en route to the ER.  Pt also received 2 mg of narcan and initially became responsive.  Pts son informed EMS on the scene pts code status DNR, however there was no DNR paperwork readily available on the scene.  Pt recently hospitalized at [facility] from 08/28~09/19 following treatment of acute on chronic hypoxic hypercapnic respiratory failure secondary to AECOPD and enterobacter aerogenes bronchitis.    ED course Upon arrival to the ER pt unresponsive to verbal/painful stimulation with O2 sats initially in the mid 90's on RA.  However, pt immediately became hypoxic with O2 sats decreasing to 60% with shallow respirations on RA, requiring bag ventilation via tracheostomy.  Due to continued hypoxia pts #6 cuffless tracheostomy was changed to a #6 cuffed trach, and pt placed on mechanical ventilation via trach with O2 sats increasing to 92%.  Upon removal of cuffless trach RT noted pts inner canula was partially occluded by dried secretions.  VBG revealed pH 7.15/pCO2 118/acid-base excess 9.2/bicarb 41.1.  CXR concerning for bilateral trace pleural effusions and emphysema.  Code sepsis activated by ER physician pt received 1L LR bolus, ceftriaxone, azithromycin, and vancomycin due to concern of pneumonia.  PCCM team contacted for ICU admission.   Pertinent  Medical History  Atrial Fibrillation on Eliquis C. Difficile Severe COPD Chronic tracheostomy Anxiety & depression Hepatitis  C Arthritis HTN Fibromyalgia Anemia CAD Thoracic AAA  Significant Hospital Events: Including procedures, antibiotic start and stop dates in addition to other pertinent events   09/27: Pt admitted to ICU with acute on chronic hypoxic hypercapnic respiratory failure secondary to end stage COPD requiring mechanical ventilation via chronic tracheostomy   Interim History / Subjective:  Pt in SVT hr 200's administered 5 mg iv metoprolol now back in nsr hr 80-90's bp stable.  Pt remains mechanically ventilated via chronic tracheostomy FiO2 @28 % with O2 sats upper 90's.  Pt states "I'm scared" Pt with severe anxiety will place orders for prn valium and attempts SBT today as tolerated   Objective   Blood pressure 116/82, pulse 98, temperature 100 F (37.8 C), resp. rate 18, height 5' (1.524 m), weight 51.3 kg, SpO2 100 %.    Vent Mode: PRVC FiO2 (%):  [28 %-100 %] 28 % Set Rate:  [14 bmp-18 bmp] 14 bmp Vt Set:  [450 mL] 450 mL PEEP:  [5 cmH20] 5 cmH20   Intake/Output Summary (Last 24 hours) at 03/21/2021 03/23/2021 Last data filed at 03/21/2021 0500 Gross per 24 hour  Intake 1649.25 ml  Output 475 ml  Net 1174.25 ml   Filed Weights   03/20/21 1528 03/20/21 2215  Weight: 54 kg 51.3 kg   Examination: General: chronically ill appearing frail female, NAD mechanically ventilated via chronic tracheostomy HENT: #6 [place]  midline chronic tracheostomy, no JVD  Lungs: diminished throughout, even, non labored  Cardiovascular: sinus rhythm, no R/G, 2+ radial/1+ distal pulses trace bilateral lower extremity edema  Abdomen: +BS x4, soft, non tender, non distended  Extremities: normal tone, no edema  Neuro: awake,  following commands, PERRL GU: indwelling catheter in place draining clear/yellow urine   Resolved Hospital Problem list     Assessment & Plan:  Acute on chronic hypoxic hypercapnic respiratory failure secondary to AECOPD and mucous plugging ?& Probable underlying pneumonia Mechanical  ventilation via chronic tracheostomy  Hx: Chronic Home O2 @4 .5~5L during the day and trilogy with capped tracheostomy qhs - Continue mechanical ventilation overnight, wean FiO2 as tolerated - Pt may require ventilator support qhs  - Supplemental O2 to maintain SpO2 > 90% - Intermittent chest x-ray & ABG PRN, f/u ABG in AM - Ensure adequate pulmonary hygiene  - IV and nebulized steroids  - Scheduled and prn bronchodilator therapy   Mildly elevated troponin likely secondary to demand ischemia  Chronic atrial fibrillation on Eliquis SVT  Hx: HTN and CAD  - Continuous telemetry monitoring  - Hold Eliquis in the setting of GI bleed - Hold BP meds in the setting of hypotension - Free T3 and T4 results pending  - Prn metoprolol for hr >115   Lactic acidosis -Trend BMP and lactic acid   Elevated liver enzymes likely secondary to hepatitis C hx  -Trend hepatic function panel   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  Transfuse PRN Hgb<7 - Pantoprazole 40mg  IV BID - NPO for pending endoscopy - GI Consult for EGD/Colonoscopy - Hold NSAIDs, Anticoagulation ASA  Acute encephalopathy secondary to hypercapnia  Hx: Anxiety and depression  - Maintain RASS goal 0 to -1 - Will minimize sedating medications for now  - Hold outpatient amitriptyline, diazepam, gabapentin, and seroquel for now  - Urine drug screen pending  - CT head negative for acute intracranial abnormality  Best Practice (right click and "Reselect all SmartList Selections" daily)   Diet/type: NPO DVT prophylaxis: prophylactic heparin  GI prophylaxis: PPI Lines: N/A Foley: yes will place orders to discontinue  Code Status:  full code Last date of  multidisciplinary goals of care discussion [03/21/2021]  Updated pts daughter-in-law via telephone regarding pts condition and current plan of care.  Pts daughter-in-law would like pt discharged to SNF for 24hr care   Labs   CBC: Recent Labs  Lab 03/20/21 1510 03/20/21 2246 03/21/21 0509  WBC 6.5  --  7.3  NEUTROABS 5.2  --   --   HGB 9.6* 9.6* 8.2*  HCT 33.6* 33.5* 26.8*  MCV 104.7*  --  100.8*  PLT 366  --  333    Basic Metabolic Panel: Recent Labs  Lab 03/20/21 1510 03/21/21 0509  NA 142 142  K 4.2 3.8  CL 92* 95*  CO2 37* 29  GLUCOSE 133* 135*  BUN 15 15  CREATININE 0.86 0.96  CALCIUM 9.0 8.7*  MG  --  1.4*  PHOS  --  1.2*   GFR: Estimated Creatinine Clearance: 39.2 mL/min (by C-G formula based on SCr of 0.96 mg/dL). Recent Labs  Lab 03/20/21 1510 03/20/21 1753 03/21/21 0509  PROCALCITON  --  0.42 0.64  WBC 6.5  --  7.3  LATICACIDVEN 3.4* 3.2*  --     Liver Function Tests: Recent Labs  Lab 03/20/21 1510  AST 73*  ALT 46*  ALKPHOS 37*  BILITOT 0.9  PROT 6.4*  ALBUMIN 3.3*   No results for  input(s): LIPASE, AMYLASE in the last 168 hours. No results for input(s): AMMONIA in the last 168 hours.  ABG    Component Value Date/Time   PHART 7.50 (H) 03/21/2021 0525   PCO2ART 40 03/21/2021 0525   PO2ART 120 (H) 03/21/2021 0525   HCO3 31.2 (H) 03/21/2021 0525   O2SAT 99.0 03/21/2021 0525     Coagulation Profile: Recent Labs  Lab 03/20/21 1510  INR 1.1    Cardiac Enzymes: No results for input(s): CKTOTAL, CKMB, CKMBINDEX, TROPONINI in the last 168 hours.  HbA1C: Hgb A1c MFr Bld  Date/Time Value Ref Range Status  01/08/2021 06:28 AM 4.9 4.8 - 5.6 % Final    Comment:    (NOTE) Pre diabetes:          5.7%-6.4%  Diabetes:              >6.4%  Glycemic control for   <7.0% adults with diabetes   06/06/2020 12:07 PM 5.3 4.8 - 5.6 % Final    Comment:    (NOTE) Pre diabetes:          5.7%-6.4%  Diabetes:               >6.4%  Glycemic control for   <7.0% adults with diabetes     CBG: No results for input(s): GLUCAP in the last 168 hours.  Review of Systems: Positives in BOLD   Gen: severe anxiety, fever, chills, weight change, fatigue, night sweats HEENT: Denies blurred vision, double vision, hearing loss, tinnitus, sinus congestion, rhinorrhea, sore throat, neck stiffness, dysphagia PULM: shortness of breath, cough, sputum production, hemoptysis, wheezing CV: Denies chest pain, edema, orthopnea, paroxysmal nocturnal dyspnea, palpitations GI: Denies abdominal pain, nausea, vomiting, diarrhea, hematochezia, melena, constipation, change in bowel habits GU: Denies dysuria, hematuria, polyuria, oliguria, urethral discharge Endocrine: Denies hot or cold intolerance, polyuria, polyphagia or appetite change Derm: Denies rash, dry skin, scaling or peeling skin change Heme: Denies easy bruising, bleeding, bleeding gums Neuro: Denies headache, numbness, weakness, slurred speech, loss of memory or consciousness  Past Medical History:  She,  has a past medical history of Anemia, Anxiety, Arthritis, Atrial fibrillation (HCC), C. difficile colitis (09/2015), COPD (chronic obstructive pulmonary disease) (HCC), Depression, Dyspnea, Dysrhythmia, Fibromyalgia, H/O tracheostomy, Hep C w/ coma, chronic, Hypertension, MRSA pneumonia (HCC) (2017), On home oxygen therapy, Osteoporosis, Peripheral neuropathy, RLS (restless legs syndrome), S/P percutaneous endoscopic gastrostomy (PEG) tube placement (HCC) (09/2015), and Ventilator associated pneumonia (HCC) (10/2015).   Surgical History:   Past Surgical History:  Procedure Laterality Date   ABDOMINAL HYSTERECTOMY     BREAST SURGERY Bilateral    Breast Implants   DILATION AND CURETTAGE OF UTERUS     PEG PLACEMENT N/A 06/21/2020   Procedure: PERCUTANEOUS ENDOSCOPIC GASTROSTOMY (PEG) PLACEMENT;  Surgeon: Regis Bill, MD;  Location: [facility] ENDOSCOPY;  Service:  Endoscopy;  Laterality: N/A;   REVERSE SHOULDER ARTHROPLASTY Right 03/06/2017   Procedure: REVERSE SHOULDER ARTHROPLASTY;  Surgeon: Christena Flake, MD;  Location: [facility] ORS;  Service: Orthopedics;  Laterality: Right;   ROTATOR CUFF REPAIR Bilateral    TRACHEOSTOMY TUBE PLACEMENT N/A 06/15/2020   Procedure: TRACHEOSTOMY;  Surgeon: Geanie Logan, MD;  Location: [facility] ORS;  Service: ENT;  Laterality: N/A;     Social History:   reports that she quit smoking about 5 years ago. Her smoking use included cigarettes. She smoked an average of 1.5 packs per day. She has never used smokeless tobacco. She reports that she does not drink alcohol and does not  use drugs.   Family History:  Her family history includes Hypertension in her mother.   Allergies No Known Allergies   Home Medications  Prior to Admission medications   Medication Sig Start Date End Date Taking? Authorizing Provider  albuterol (VENTOLIN HFA) 108 (90 Base) MCG/ACT inhaler Inhale 2 puffs into the lungs every 4 (four) hours as needed for wheezing or shortness of breath.    [provider]  amitriptyline (ELAVIL) 25 MG tablet Take 25 mg by mouth at bedtime.  01/14/19   [provider]  apixaban (ELIQUIS) 5 MG TABS tablet Take 1 tablet (5 mg total) by mouth 2 (two) times daily. 11/10/16   Adrian Saran, MD  budesonide (PULMICORT) 0.5 MG/2ML nebulizer solution Take 0.5 mg by nebulization daily. 11/28/20   [provider]  diazepam (VALIUM) 5 MG tablet Take 1-1.5 tablets (5-7.5 mg total) by mouth every 8 (eight) hours as needed for anxiety. 03/12/21   Osvaldo Shipper, MD  diltiazem (CARDIZEM CD) 120 MG 24 hr capsule Take 120 mg by mouth daily. 04/29/19   [provider]  estradiol (ESTRACE) 1 MG tablet Take 1 mg by mouth daily. 09/10/16   [provider]  gabapentin (NEURONTIN) 300 MG capsule Take 300 mg by mouth 3 (three) times daily. 05/06/20   [provider]  ipratropium-albuterol (DUONEB)  0.5-2.5 (3) MG/3ML SOLN Take 3 mLs by nebulization every 6 (six) hours. 02/23/21   Salena Saner, MD  iron polysaccharides (NIFEREX) 150 MG capsule Take 1 capsule (150 mg total) by mouth daily. 01/12/21 04/12/21  Gillis Santa, MD  MAGNESIUM-OXIDE 400 (241.3 Mg) MG tablet Take 400 mg by mouth daily.  08/24/16   [provider]  methocarbamol (ROBAXIN) 500 MG tablet Take 500 mg by mouth 2 (two) times daily. 12/27/20   [provider]  metoprolol tartrate (LOPRESSOR) 25 MG tablet Take 1 tablet (25 mg total) by mouth 2 (two) times daily. 03/12/21 06/10/21  Osvaldo Shipper, MD  omeprazole (PRILOSEC) 40 MG capsule Take 40 mg by mouth daily. 04/16/19   [provider]  predniSONE (DELTASONE) 5 MG tablet Take 5 mg by mouth daily. 12/25/20   [provider]  QUEtiapine (SEROQUEL) 25 MG tablet Take 25 mg by mouth at bedtime.  10/01/16   [provider]  rosuvastatin (CRESTOR) 10 MG tablet Take 10 mg by mouth at bedtime. 10/23/20   [provider]  vitamin B-12 100 MCG tablet Take 1 tablet (100 mcg total) by mouth daily. 01/12/21 04/12/21  Gillis Santa, MD  Vitamin D, Ergocalciferol, (DRISDOL) 1.25 MG (50000 UNIT) CAPS capsule Take 1 capsule (50,000 Units total) by mouth every 7 (seven) days. 01/17/21 04/17/21  Gillis Santa, MD     Critical care time: 40 minutes     Camie Patience, AGNP  Pulmonary/Critical Care Pager [phone] (please enter 7 digits) PCCM Consult Pager [phone] (please enter 7 digits)

## 2021-03-22 DIAGNOSIS — D649 Anemia, unspecified: Secondary | ICD-10-CM

## 2021-03-22 DIAGNOSIS — K921 Melena: Secondary | ICD-10-CM | POA: Diagnosis not present

## 2021-03-22 DIAGNOSIS — J9602 Acute respiratory failure with hypercapnia: Secondary | ICD-10-CM | POA: Diagnosis not present

## 2021-03-22 DIAGNOSIS — J9621 Acute and chronic respiratory failure with hypoxia: Secondary | ICD-10-CM

## 2021-03-22 DIAGNOSIS — J90 Pleural effusion, not elsewhere classified: Secondary | ICD-10-CM | POA: Diagnosis not present

## 2021-03-22 DIAGNOSIS — J9601 Acute respiratory failure with hypoxia: Secondary | ICD-10-CM | POA: Diagnosis not present

## 2021-03-22 DIAGNOSIS — J9622 Acute and chronic respiratory failure with hypercapnia: Secondary | ICD-10-CM

## 2021-03-22 LAB — CBC WITH DIFFERENTIAL/PLATELET
Abs Immature Granulocytes: 0.06 10*3/uL (ref 0.00–0.07)
Basophils Absolute: 0 10*3/uL (ref 0.0–0.1)
Basophils Relative: 0 %
Eosinophils Absolute: 0 10*3/uL (ref 0.0–0.5)
Eosinophils Relative: 0 %
HCT: 23.9 % — ABNORMAL LOW (ref 36.0–46.0)
Hemoglobin: 7.3 g/dL — ABNORMAL LOW (ref 12.0–15.0)
Immature Granulocytes: 1 %
Lymphocytes Relative: 9 %
Lymphs Abs: 0.8 10*3/uL (ref 0.7–4.0)
MCH: 29.7 pg (ref 26.0–34.0)
MCHC: 30.5 g/dL (ref 30.0–36.0)
MCV: 97.2 fL (ref 80.0–100.0)
Monocytes Absolute: 0.8 10*3/uL (ref 0.1–1.0)
Monocytes Relative: 10 %
Neutro Abs: 6.5 10*3/uL (ref 1.7–7.7)
Neutrophils Relative %: 80 %
Platelets: 301 10*3/uL (ref 150–400)
RBC: 2.46 MIL/uL — ABNORMAL LOW (ref 3.87–5.11)
RDW: 15.5 % (ref 11.5–15.5)
WBC: 8.1 10*3/uL (ref 4.0–10.5)
nRBC: 0 % (ref 0.0–0.2)

## 2021-03-22 LAB — BASIC METABOLIC PANEL
Anion gap: 9 (ref 5–15)
BUN: 13 mg/dL (ref 8–23)
CO2: 38 mmol/L — ABNORMAL HIGH (ref 22–32)
Calcium: 8.6 mg/dL — ABNORMAL LOW (ref 8.9–10.3)
Chloride: 95 mmol/L — ABNORMAL LOW (ref 98–111)
Creatinine, Ser: 0.55 mg/dL (ref 0.44–1.00)
GFR, Estimated: >60 mL/min (ref >60)
Glucose, Bld: 103 mg/dL — ABNORMAL HIGH (ref 70–99)
Potassium: 3.7 mmol/L (ref 3.5–5.1)
Sodium: 142 mmol/L (ref 135–145)

## 2021-03-22 LAB — HEMOGLOBIN AND HEMATOCRIT, BLOOD
HCT: 24.8 % — ABNORMAL LOW (ref 36.0–46.0)
HCT: 27.1 % — ABNORMAL LOW (ref 36.0–46.0)
HCT: 26.8 % — ABNORMAL LOW (ref 36.0–46.0)
Hemoglobin: 7.8 g/dL — ABNORMAL LOW (ref 12.0–15.0)
Hemoglobin: 8.3 g/dL — ABNORMAL LOW (ref 12.0–15.0)
Hemoglobin: 8.4 g/dL — ABNORMAL LOW (ref 12.0–15.0)

## 2021-03-22 LAB — GLUCOSE, CAPILLARY
Glucose-Capillary: 101 mg/dL — ABNORMAL HIGH (ref 70–99)
Glucose-Capillary: 109 mg/dL — ABNORMAL HIGH (ref 70–99)
Glucose-Capillary: 111 mg/dL — ABNORMAL HIGH (ref 70–99)
Glucose-Capillary: 120 mg/dL — ABNORMAL HIGH (ref 70–99)
Glucose-Capillary: 155 mg/dL — ABNORMAL HIGH (ref 70–99)

## 2021-03-22 LAB — MAGNESIUM: Magnesium: 2.1 mg/dL (ref 1.7–2.4)

## 2021-03-22 LAB — LEGIONELLA PNEUMOPHILA SEROGP 1 UR AG: L. pneumophila Serogp 1 Ur Ag: NEGATIVE

## 2021-03-22 LAB — T3, FREE: T3, Free: 1.5 pg/mL — ABNORMAL LOW (ref 2.0–4.4)

## 2021-03-22 LAB — PROCALCITONIN: Procalcitonin: 0.35 ng/mL

## 2021-03-22 MED ORDER — METHYLPREDNISOLONE SODIUM SUCC 40 MG IJ SOLR
20.0000 mg | INTRAMUSCULAR | Status: DC
  Administered 2021-03-22 – 2021-03-30 (×9): 20 mg via INTRAVENOUS
  Filled 2021-03-22 (×9): qty 1

## 2021-03-22 NOTE — TOC Progression Note (Signed)
Transition of Care [facility]) - Progression Note    Patient Details  Name: Virginia [name] MRN: [id number] Date of Birth: 03-31-1951  Transition of Care [facility]) CM/SW Contact  Hetty Ely, RN Phone Number: 03/22/2021, 1:46 PM  Clinical Narrative: Spoke with patient about bed acceptance offer from [facility], patient receptive to offer, understands length of stay will be determined by PT/OT evaluation. PT/OT assessment to be done for Authorization. Message left for [facility] admissions coordinator about approval of bed offer and also offer accepted electronically.         Expected Discharge Plan and Services                                                 Social Determinants of Health (SDOH) Interventions    Readmission Risk Interventions No flowsheet data found.

## 2021-03-22 NOTE — Progress Notes (Signed)
Pt became very anxious and sob, stating that she could not get her breath. Tidal volumes were in the 100's, took patient off and bagged patient for 5 min until she calmed down. Placed back on vent on spontaneous mode. Tolerating well At this time.

## 2021-03-22 NOTE — Evaluation (Signed)
Occupational Therapy Evaluation Patient Details Name: Virginia [name] MRN: [id number] DOB: Sep 10, 1950 Today's Date: 03/22/2021   History of Present Illness This is a 70 yo female with end stage COPD and chronic tracheostomy who presented to [facility] ER via EMS from home on 09/27 due to unresponsiveness. Patient recently discharged   Clinical Impression   Patient presenting with decreased Ind in self care, balance, functional mobility/transfers, endurance, and safety awareness.Patient is well known to therapist and she has aides that come 7x/wk but does not have 24/7 assistance at discharge. Pt has trach at baseline and uses 5Ls at rest. She is very anxious with mobility.  Patient currently functioning at min A for bed mobility and min of 2 for standing tasks. She likely needs min - mod A for self care tasks depending on level of fatigue. Patient will benefit from acute OT to increase overall independence in the areas of ADLs, functional mobility, and safety awareness in order to safely discharge to next venue of care.      Recommendations for follow up therapy are one component of a multi-disciplinary discharge planning process, led by the attending physician.  Recommendations may be updated based on patient status, additional functional criteria and insurance authorization.   Follow Up Recommendations  Supervision/Assistance - 24 hour;SNF (pending progress she may be able to return home.    Equipment Recommendations  None recommended by OT       Precautions / Restrictions Precautions Precautions: Fall Precaution Comments: pt trach Restrictions Weight Bearing Restrictions: No      Mobility Bed Mobility Overal bed mobility: Needs Assistance Bed Mobility: Supine to Sit;Sit to Supine Rolling: Min assist   Supine to sit: Min assist Sit to supine: Min assist   General bed mobility comments: Only requires management of lines and leads    Transfers Overall transfer level: Needs  assistance Equipment used: None;2 person hand held assist Transfers: Sit to/from Stand Sit to Stand: Min assist Stand pivot transfers: Min guard;Min assist       General transfer comment: patient able to stand at edge of bed with hand held assist. Reports dizziness and requests to lie back down.    Balance Overall balance assessment: Needs assistance Sitting-balance support: Bilateral upper extremity supported Sitting balance-Leahy Scale: Good Sitting balance - Comments: Good seated balance without UE assistance at EOB   Standing balance support: Bilateral upper extremity supported;During functional activity Standing balance-Leahy Scale: Poor Standing balance comment: requires B UE support                           ADL either performed or assessed with clinical judgement   ADL Overall ADL's : Needs assistance/impaired     Grooming: Sitting;Wash/dry face;Wash/dry hands                                       Vision Patient Visual Report: No change from baseline              Pertinent Vitals/Pain Pain Assessment: Faces Faces Pain Scale: No hurt Pain Intervention(s): Monitored during session     Hand Dominance Right   Extremity/Trunk Assessment Upper Extremity Assessment Upper Extremity Assessment: Defer to OT evaluation   Lower Extremity Assessment Lower Extremity Assessment: Generalized weakness   Cervical / Trunk Assessment Cervical / Trunk Assessment: Kyphotic   Communication Communication Communication: Expressive difficulties   Cognition Arousal/Alertness:  Awake/alert Behavior During Therapy: Anxious Overall Cognitive Status: Difficult to assess                                                Home Living Family/patient expects to be discharged to:: Private residence Living Arrangements: Alone Available Help at Discharge: Family;Personal care attendant;Available PRN/intermittently Type of Home:  Apartment Home Access: Level entry     Home Layout: One level     Bathroom Shower/Tub: Tub/shower unit         Home Equipment: Emergency planning/management officer - 4 wheels;Grab bars - toilet   Additional Comments: fall history      Prior Functioning/Environment Level of Independence: Needs assistance  Gait / Transfers Assistance Needed: modI with rollator ADL's / Homemaking Assistance Needed: lives alone but family assists with picking up groceries, putting them away, cooking as needed. Has an aide that comes M- F for several hours to help with home management and ADLs   Comments: 5L at rest, 7-8L with ambulation (8L on a bad day per pt report)        OT Problem List: Decreased strength;Decreased activity tolerance;Impaired balance (sitting and/or standing);Decreased safety awareness;Decreased cognition;Cardiopulmonary status limiting activity      OT Treatment/Interventions: Self-care/ADL training;Therapeutic exercise;Therapeutic activities;Energy conservation;Patient/family education;DME and/or AE instruction;Balance training    OT Goals(Current goals can be found in the care plan section) Acute Rehab OT Goals Patient Stated Goal: none stated OT Goal Formulation: With patient Time For Goal Achievement: 04/05/21 Potential to Achieve Goals: Good ADL Goals Pt Will Perform Grooming: with modified independence;standing Pt Will Perform Upper Body Bathing: with modified independence;sitting Pt Will Perform Lower Body Dressing: with supervision;sit to/from stand Pt Will Transfer to Toilet: with supervision;ambulating Pt Will Perform Toileting - Clothing Manipulation and hygiene: with supervision;sit to/from stand  OT Frequency: Min 2X/week    AM-PAC OT "6 Clicks" Daily Activity     Outcome Measure Help from another person eating meals?: None Help from another person taking care of personal grooming?: None Help from another person toileting, which includes using toliet, bedpan, or urinal?:  A Little Help from another person bathing (including washing, rinsing, drying)?: A Little Help from another person to put on and taking off regular upper body clothing?: A Little   6 Click Score: 17   End of Session Equipment Utilized During Treatment: Oxygen Nurse Communication: Mobility status  Activity Tolerance: Patient tolerated treatment well Patient left: in bed;with call bell/phone within reach;with bed alarm set  OT Visit Diagnosis: Unsteadiness on feet (R26.81);Repeated falls (R29.6);Muscle weakness (generalized) (M62.81);History of falling (Z91.81)                Time: 9935-7017 OT Time Calculation (min): 17 min Charges:  OT General Charges $OT Visit: 1 Visit OT Evaluation $OT Eval Moderate Complexity: 1 [address], [place], OTR/L , CBIS ascom [phone]  03/22/21, 4:01 PM

## 2021-03-22 NOTE — Progress Notes (Signed)
SLP Cancellation Note  Patient Details Name: Virginia [name] MRN: [id number] DOB: Jan 02, 1951   Cancelled treatment:       Reason Eval/Treat Not Completed: Patient not medically ready (chart reviewed). Per chart review, pt was admitted to ICU with acute on chronic hypoxic hypercapnic respiratory failure secondary to end stage COPD requiring mechanical ventilation via chronic tracheostomy; dx'd w/ Sepsis. Currently today, she is tolerating PSV and RT/MD will attempt further wean to TCT as tolerated. ST services will f/u tomorrow to see how TCT wean is progressing -- pt will need to demonstrate toleration of TCT comfortably for ~4 hours b/f attempting to use PMV, progress to meals.  ST services will f/u in the morning.     Jerilynn Som, MS, CCC-SLP Speech Language Pathologist Rehab Services [phone] [facility] 03/22/2021, 2:45 PM

## 2021-03-22 NOTE — Evaluation (Signed)
Physical Therapy Evaluation Patient Details Name: Virginia [name] MRN: [id number] DOB: 29-May-1951 Today's Date: 03/22/2021  History of Present Illness  This is a 70 yo female with end stage COPD and chronic tracheostomy who presented to [facility] ER via EMS from home on 09/27 due to unresponsiveness. Patient recently discharged   Clinical Impression  Patient received in bed, anxious, but agreeable to PT session. Patient requires min assist for bed mobility, She is reporting dizziness with sitting edge of bed which improved with increased time. Min +2 assist for sit to stand. She is very anxious and sob with activity. Patient will continue to benefit from skilled PT while here to improve activity tolerance and mobility.        Recommendations for follow up therapy are one component of a multi-disciplinary discharge planning process, led by the attending physician.  Recommendations may be updated based on patient status, additional functional criteria and insurance authorization.  Follow Up Recommendations Other (comment);SNF (dependent on progress)    Equipment Recommendations  None recommended by PT    Recommendations for Other Services       Precautions / Restrictions Precautions Precautions: Fall Restrictions Weight Bearing Restrictions: No      Mobility  Bed Mobility Overal bed mobility: Needs Assistance Bed Mobility: Supine to Sit;Sit to Supine     Supine to sit: Min assist Sit to supine: Min assist        Transfers Overall transfer level: Needs assistance Equipment used: None;2 person hand held assist Transfers: Sit to/from Stand Sit to Stand: Min assist         General transfer comment: patient able to stand at edge of bed with hand held assist. Reports dizziness and requests to lie back down.  Ambulation/Gait             General Gait Details: unable  Stairs            Wheelchair Mobility    Modified Rankin (Stroke Patients Only)        Balance Overall balance assessment: Needs assistance Sitting-balance support: Bilateral upper extremity supported Sitting balance-Leahy Scale: Good     Standing balance support: Bilateral upper extremity supported;During functional activity Standing balance-Leahy Scale: Poor Standing balance comment: requires B UE support                             Pertinent Vitals/Pain Pain Assessment: Faces Faces Pain Scale: No hurt Pain Intervention(s): Monitored during session    Home Living Family/patient expects to be discharged to:: Private residence Living Arrangements: Alone Available Help at Discharge: Family;Personal care attendant;Available PRN/intermittently Type of Home: Apartment Home Access: Level entry     Home Layout: One level Home Equipment: Emergency planning/management officer - 4 wheels;Grab bars - toilet Additional Comments: fall history    Prior Function Level of Independence: Needs assistance   Gait / Transfers Assistance Needed: modI with rollator  ADL's / Homemaking Assistance Needed: lives alone but family assists with picking up groceries, putting them away, cooking as needed. Has an aide that comes M- F for several hours to help with home management and ADLs  Comments: 5L at rest, 7-8L with ambulation (8L on a bad day per pt report)     Hand Dominance   Dominant Hand: Right    Extremity/Trunk Assessment   Upper Extremity Assessment Upper Extremity Assessment: Defer to OT evaluation    Lower Extremity Assessment Lower Extremity Assessment: Generalized weakness    Cervical /  Trunk Assessment Cervical / Trunk Assessment: Kyphotic  Communication   Communication: Expressive difficulties  Cognition Arousal/Alertness: Awake/alert Behavior During Therapy: Anxious Overall Cognitive Status: Difficult to assess                                        General Comments      Exercises     Assessment/Plan    PT Assessment Patient needs  continued PT services  PT Problem List Decreased strength;Decreased mobility;Decreased activity tolerance;Decreased balance;Cardiopulmonary status limiting activity       PT Treatment Interventions DME instruction;Therapeutic exercise;Gait training;Balance training;Stair training;Neuromuscular re-education;Functional mobility training;Therapeutic activities;Patient/family education    PT Goals (Current goals can be found in the Care Plan section)  Acute Rehab PT Goals Patient Stated Goal: none stated PT Goal Formulation: Patient unable to participate in goal setting Time For Goal Achievement: 04/05/21    Frequency Min 2X/week   Barriers to discharge        Co-evaluation               AM-PAC PT "6 Clicks" Mobility  Outcome Measure Help needed turning from your back to your side while in a flat bed without using bedrails?: A Little Help needed moving from lying on your back to sitting on the side of a flat bed without using bedrails?: A Little Help needed moving to and from a bed to a chair (including a wheelchair)?: A Lot Help needed standing up from a chair using your arms (e.g., wheelchair or bedside chair)?: A Little Help needed to walk in hospital room?: Total Help needed climbing 3-5 steps with a railing? : Total 6 Click Score: 13    End of Session Equipment Utilized During Treatment: Oxygen Activity Tolerance: Patient limited by fatigue;Other (comment) (anxiety) Patient left: in bed;with call bell/phone within reach;with nursing/sitter in room Nurse Communication: Mobility status PT Visit Diagnosis: Difficulty in walking, not elsewhere classified (R26.2);Other abnormalities of gait and mobility (R26.89);Dizziness and giddiness (R42)    Time: 1313-1330 PT Time Calculation (min) (ACUTE ONLY): 17 min   Charges:   PT Evaluation $PT Eval Moderate Complexity: 1 Mod          Mellissa Conley, PT, GCS 03/22/21,1:48 PM

## 2021-03-22 NOTE — Consult Note (Signed)
Cephas Darby, MD [address]  [place]  [place], [place] [address]  Main: [phone]  Fax: [phone] Pager: [phone]   Consultation  Referring Provider:     No ref. provider found Primary Care Physician:  Idelle Crouch, MD Primary Gastroenterologist:  Dr. Haig Prophet         Reason for Consultation:     Melena, anemia  Date of Admission:  03/20/2021 Date of Consultation:  03/22/2021         HPI:   Virginia [name] is a 70 y.o. female with history of A. fib on Eliquis, COPD s/p tracheostomy who is admitted with hypoxic respiratory failure.  Patient was recently admitted to [facility] from 8/28-9/19 for treatment of acute on chronic hypoxic hypercapnic respiratory failure secondary to COPD and bronchitis.  Patient is readmitted on 9/27 secondary to hypoxic respiratory failure secondary to occluded tracheostomy.  Patient received broad-spectrum antibiotics, currently being treated for hypoxic respiratory failure on mechanical ventilation.  Today, patient is on minimal vent settings, antibiotics have been discontinued.  GI is consulted today due to witnessed episode of melena in the ER at the time of admission and gradual downtrending hemoglobin.  Patient's hemoglobin was 9.6 on 9/27, MCV 104.7.  Baseline hemoglobin around 8.4 during most recent hospitalization.  Her hemoglobin dropped to 7.3 early this morning but did not receive any blood transfusion, serial monitoring of hemoglobin revealed 8.3 this afternoon.  Patient does have chronic anemia since January 2022 hemoglobin anywhere from 7 to 10, normocytic or macrocytic anemia.  She does have history of B12 and iron deficiency and she does report taking oral iron supplements. Eliquis has been held since admission due to concern for GI bleed.  Since admission, patient did not have any bowel movement per her nurse.  Patient denies any abdominal pain, nausea or vomiting.  Patient has been n.p.o. because of frequent.  Normal  BUN/creatinine  Patient underwent upper endoscopy with PEG placement in December 2021 as she was on mechanical ventilator.  Patient prior to admission has been tolerating p.o. well and lives at home  NSAIDs: None  Antiplts/Anticoagulants/Anti thrombotics: None  GI Procedures:  Upper endoscopy 06/21/2020 - Normal esophagus. - Normal stomach. - Normal examined duodenum. - An externally removable 20 French PEG placement was successfully completed. - No specimens collected.  Past Medical History:  Diagnosis Date  . Anemia   . Anxiety   . Arthritis   . Atrial fibrillation ([place])   . C. difficile colitis 09/2015  . COPD (chronic obstructive pulmonary disease) ([place])   . Depression   . Dyspnea   . Dysrhythmia   . Fibromyalgia   . H/O tracheostomy   . Hep C w/ coma, chronic   . Hypertension   . MRSA pneumonia ([place]) 2017  . On home oxygen therapy    3 L/M   . Osteoporosis   . Peripheral neuropathy   . RLS (restless legs syndrome)   . S/P percutaneous endoscopic gastrostomy (PEG) tube placement ([place]) 09/2015  . Ventilator associated pneumonia [facility]) 10/2015   [facility], [place]    Past Surgical History:  Procedure Laterality Date  . ABDOMINAL HYSTERECTOMY    . BREAST SURGERY Bilateral    Breast Implants  . DILATION AND CURETTAGE OF UTERUS    . PEG PLACEMENT N/A 06/21/2020   Procedure: PERCUTANEOUS ENDOSCOPIC GASTROSTOMY (PEG) PLACEMENT;  Surgeon: Lesly Rubenstein, MD;  Location: [facility] ENDOSCOPY;  Service: Endoscopy;  Laterality: N/A;  . REVERSE SHOULDER  ARTHROPLASTY Right 03/06/2017   Procedure: REVERSE SHOULDER ARTHROPLASTY;  Surgeon: Corky Mull, MD;  Location: [facility] ORS;  Service: Orthopedics;  Laterality: Right;  . ROTATOR CUFF REPAIR Bilateral   . TRACHEOSTOMY TUBE PLACEMENT N/A 06/15/2020   Procedure: TRACHEOSTOMY;  Surgeon: Clyde Canterbury, MD;  Location: [facility] ORS;  Service: ENT;  Laterality: N/A;    Prior to Admission medications   Medication Sig Start  Date End Date Taking? Authorizing Provider  acetaminophen (TYLENOL) 500 MG tablet Take 1,000 mg by mouth every 6 (six) hours as needed for mild pain.   Yes [provider]  apixaban (ELIQUIS) 5 MG TABS tablet Take 1 tablet (5 mg total) by mouth 2 (two) times daily. 11/10/16  Yes Mody, Ulice Bold, MD  diazepam (VALIUM) 5 MG tablet Take 1-1.5 tablets (5-7.5 mg total) by mouth every 8 (eight) hours as needed for anxiety. 03/12/21  Yes Bonnielee Haff, MD  estradiol (ESTRACE) 1 MG tablet Take 1 mg by mouth daily. 09/10/16  Yes [provider]  gabapentin (NEURONTIN) 300 MG capsule Take 300 mg by mouth 3 (three) times daily. 05/06/20  Yes [provider]  iron polysaccharides (NIFEREX) 150 MG capsule Take 1 capsule (150 mg total) by mouth daily. 01/12/21 04/12/21 Yes Val Riles, MD  MAGNESIUM-OXIDE 400 (241.3 Mg) MG tablet Take 400 mg by mouth daily.  08/24/16  Yes [provider]  methocarbamol (ROBAXIN) 500 MG tablet Take 500 mg by mouth 2 (two) times daily. 12/27/20  Yes [provider]  metoprolol tartrate (LOPRESSOR) 25 MG tablet Take 1 tablet (25 mg total) by mouth 2 (two) times daily. 03/12/21 06/10/21 Yes Bonnielee Haff, MD  omeprazole (PRILOSEC) 40 MG capsule Take 40 mg by mouth daily. 04/16/19  Yes [provider]  predniSONE (DELTASONE) 5 MG tablet Take 5 mg by mouth daily. 12/25/20  Yes [provider]  QUEtiapine (SEROQUEL) 25 MG tablet Take 25 mg by mouth at bedtime.  10/01/16  Yes [provider]  rosuvastatin (CRESTOR) 10 MG tablet Take 10 mg by mouth at bedtime. 10/23/20  Yes [provider]  vitamin B-12 100 MCG tablet Take 1 tablet (100 mcg total) by mouth daily. 01/12/21 04/12/21 Yes Val Riles, MD  albuterol (VENTOLIN HFA) 108 (90 Base) MCG/ACT inhaler Inhale 2 puffs into the lungs every 4 (four) hours as needed for wheezing or shortness of breath.    [provider]  amitriptyline (ELAVIL) 25 MG tablet Take  25 mg by mouth at bedtime.  01/14/19   [provider]  budesonide (PULMICORT) 0.5 MG/2ML nebulizer solution Take 0.5 mg by nebulization daily. 11/28/20   [provider]  diltiazem (CARDIZEM CD) 120 MG 24 hr capsule Take 120 mg by mouth daily. Patient not taking: No sig reported 04/29/19   [provider]  ipratropium-albuterol (DUONEB) 0.5-2.5 (3) MG/3ML SOLN Take 3 mLs by nebulization every 6 (six) hours. 02/23/21   Tyler Pita, MD  lisinopril (ZESTRIL) 10 MG tablet Take 10 mg by mouth daily. Patient not taking: No sig reported 03/16/21   [provider]  Vitamin D, Ergocalciferol, (DRISDOL) 1.25 MG (50000 UNIT) CAPS capsule Take 1 capsule (50,000 Units total) by mouth every 7 (seven) days. Patient not taking: No sig reported 01/17/21 04/17/21  Val Riles, MD    Current Facility-Administered Medications:  .  0.9 %  sodium chloride infusion, 250 mL, Intravenous, Continuous, Ouma, Elizabeth Achieng, NP .  budesonide (PULMICORT) nebulizer solution 0.25 mg, 0.25 mg, Nebulization, BID, Ouma, Bing Neighbors, NP,  0.25 mg at 03/22/21 0809 .  chlorhexidine (PERIDEX) 0.12 % solution 15 mL, 15 mL, Mouth Rinse, BID, Kasa, Kurian, MD, 15 mL at 03/22/21 0946 .  Chlorhexidine Gluconate Cloth 2 % PADS 6 each, 6 each, Topical, Q0600, Flora Lipps, MD, 6 each at 03/22/21 [phone] .  diazepam (VALIUM) injection 2.5-5 mg, 2.5-5 mg, Intravenous, Q6H PRN, Graves, Dana E, NP, 5 mg at 03/22/21 1324 .  docusate (COLACE) 50 MG/5ML liquid 100 mg, 100 mg, Per Tube, BID, Ouma, Bing Neighbors, NP .  docusate sodium (COLACE) capsule 100 mg, 100 mg, Oral, BID PRN, Ouma, Bing Neighbors, NP .  ipratropium-albuterol (DUONEB) 0.5-2.5 (3) MG/3ML nebulizer solution 3 mL, 3 mL, Nebulization, Q6H, Ouma, Bing Neighbors, NP, 3 mL at 03/22/21 1306 .  ipratropium-albuterol (DUONEB) 0.5-2.5 (3) MG/3ML nebulizer solution 3 mL, 3 mL, Nebulization, Q6H PRN, Ouma, Bing Neighbors, NP .   MEDLINE mouth rinse, 15 mL, Mouth Rinse, q12n4p, Kasa, Kurian, MD, 15 mL at 03/22/21 1200 .  methylPREDNISolone sodium succinate (SOLU-MEDROL) 40 mg/mL injection 20 mg, 20 mg, Intravenous, Q24H, Darel Hong D, NP, 20 mg at 03/22/21 0946 .  metoprolol tartrate (LOPRESSOR) injection 2.5-5 mg, 2.5-5 mg, Intravenous, Q6H PRN, Graves, Dana E, NP .  morphine 2 MG/ML injection 2 mg, 2 mg, Intravenous, Q1H PRN, Naaman Plummer, MD, 2 mg at 03/22/21 1447 .  pantoprazole (PROTONIX) injection 40 mg, 40 mg, Intravenous, Q12H, Ouma, Bing Neighbors, NP, 40 mg at 03/22/21 0947 .  polyethylene glycol (MIRALAX / GLYCOLAX) packet 17 g, 17 g, Per Tube, Daily, Ouma, Bing Neighbors, NP .  white petrolatum (VASELINE) gel, , Topical, PRN, Flora Lipps, MD   Family History  Problem Relation Age of Onset  . Hypertension Mother      Social History   Tobacco Use  . Smoking status: Former    Packs/day: 1.50    Types: Cigarettes    Quit date: 10/05/2015    Years since quitting: 5.4  . Smokeless tobacco: Never  Vaping Use  . Vaping Use: Some days  . Last attempt to quit: 10/05/2015  Substance Use Topics  . Alcohol use: No  . Drug use: No    Allergies as of 03/20/2021  . (No Known Allergies)    Review of Systems:    All systems reviewed and negative except where noted in HPI.   Physical Exam:  Vital signs in last 24 hours: Temp:  [98 F (36.7 C)-98.8 F (37.1 C)] 98.5 F (36.9 C) (09/29 1600) Pulse Rate:  [84-109] 87 (09/29 1600) Resp:  [9-27] 15 (09/29 1600) BP: (123-176)/(78-116) 169/102 (09/29 1600) SpO2:  [91 %-100 %] 93 % (09/29 1706) FiO2 (%):  [28 %-32 %] 30 % (09/29 1706) Weight:  [55.4 kg] 55.4 kg (09/29 0500) Last BM Date:  (pta) General:   Pleasant, cooperative in NAD Head:  Normocephalic and atraumatic. Eyes:   No icterus.   Conjunctiva pink. PERRLA. Ears:  Normal auditory acuity. Neck:  Supple; no masses or thyroidomegaly Lungs: Respirations even and unlabored. Lungs  clear to auscultation bilaterally.   No wheezes, crackles, or rhonchi.  Heart:  Regular rate and rhythm;  Without murmur, clicks, rubs or gallops Abdomen:  Soft, nondistended, nontender. Normal bowel sounds. No appreciable masses or hepatomegaly.  No rebound or guarding.  Rectal:  Not performed. Msk:  Symmetrical without gross deformities.  Extremities:  Without edema, cyanosis or clubbing. Neurologic:  Alert and oriented x3;  grossly normal neurologically. Skin:  Intact without significant lesions or rashes. Psych:  Alert and cooperative. Normal affect.  LAB RESULTS: CBC Latest Ref Rng & Units 03/22/2021 03/22/2021 03/22/2021  WBC 4.0 - 10.5 K/uL - - 8.1  Hemoglobin 12.0 - 15.0 g/dL 8.3(L) 7.8(L) 7.3(L)  Hematocrit 36.0 - 46.0 % 27.1(L) 24.8(L) 23.9(L)  Platelets 150 - 400 K/uL - - 301    BMET BMP Latest Ref Rng & Units 03/22/2021 03/21/2021 03/20/2021  Glucose 70 - 99 mg/dL 103(H) 135(H) 133(H)  BUN 8 - 23 mg/dL _0 Creatinine 0.44 - 1.00 mg/dL 0.55 0.96 0.86  Sodium 135 - 145 mmol/L 142 142 142  Potassium 3.5 - 5.1 mmol/L 3.7 3.8 4.2  Chloride 98 - 111 mmol/L 95(L) 95(L) 92(L)  CO2 22 - 32 mmol/L 38(H) 29 37(H)  Calcium 8.9 - 10.3 mg/dL 8.6(L) 8.7(L) 9.0    LFT Hepatic Function Latest Ref Rng & Units 03/21/2021 03/20/2021 02/21/2021  Total Protein 6.5 - 8.1 g/dL 5.4(L) 6.4(L) -  Albumin 3.5 - 5.0 g/dL 3.0(L) 3.3(L) 2.7(L)  AST 15 - 41 U/L 44(H) 73(H) -  ALT 0 - 44 U/L 43 46(H) -  Alk Phosphatase 38 - 126 U/L 32(L) 37(L) -  Total Bilirubin 0.3 - 1.2 mg/dL 16.1(H) 0.9 -  Bilirubin, Direct 0.0 - 0.2 mg/dL 5.2(H) - -     STUDIES: CT Head Wo Contrast  Result Date: 03/20/2021 CLINICAL DATA:  Mental status change unresponsive EXAM: CT HEAD WITHOUT CONTRAST TECHNIQUE: Contiguous axial images were obtained from the base of the skull through the vertex without intravenous contrast. COMPARISON:  CT brain 02/18/2021 FINDINGS: Brain: No acute territorial infarction, hemorrhage or  intracranial mass. The ventricles are nonenlarged. Minimal chronic small vessel ischemic change of white matter. Vascular: No hyperdense vessels.  No unexpected calcification Skull: Normal. Negative for fracture or focal lesion. Sinuses/Orbits: No acute finding. Other: None IMPRESSION: Negative.  No CT evidence for acute intracranial abnormality Electronically Signed   By: Donavan Foil M.D.   On: 03/20/2021 19:29   US Abdomen Limited RUQ (LIVER/GB)  Result Date: 03/21/2021 CLINICAL DATA:  Elevated liver function tests. EXAM: ULTRASOUND ABDOMEN LIMITED RIGHT UPPER QUADRANT COMPARISON:  None. FINDINGS: Gallbladder: No gallstones or wall thickening visualized. No sonographic Murphy sign noted by sonographer. Common bile duct: Diameter: 3 mm. Liver: No focal lesion identified. Within normal limits in parenchymal echogenicity. Portal vein is patent on color Doppler imaging with normal direction of blood flow towards the liver. Other: None. IMPRESSION: Unremarkable right upper quadrant ultrasound. Electronically Signed   By: Iven Finn M.D.   On: 03/21/2021 18:59      Impression / Plan:   AHSHA HINSLEY is a 70 y.o. female with history of end-stage COPD on home oxygen, A. fib on Eliquis, s/p tracheostomy is admitted with acute hypoxic respiratory failure, GI is consulted for witnessed episode of melena and anemia  ?Melena and chronic iron and B12 deficiency anemia Overall, patient's hemoglobin is at baseline when compared to prior admission in September and within last 1 year BUN/creatinine is normal Patient has been chronically anemic for last several years However, given possible melena, I did offer endoscopic evaluation and discussed about upper endoscopy as well as colonoscopy patient refused to undergo.  Moreover, patient's tracheostomy device is not amenable for her to tolerate bowel prep at this time.  She is currently on vent weaning protocol As long as patient is not actively bleeding,  defer endoscopic evaluation as inpatient.  She had an upper endoscopy in December 2021 which was unremarkable.  Recommend colonoscopy  either inpatient or outpatient if patient is agreeable Continue Protonix 40 mg p.o./IV twice daily Restart diet as tolerated Okay to restart Eliquis, if patient rebleeds, please call GI back  Thank you for involving me in the care of this patient.  GI will sign off at this time, please call us back with questions or concerns    LOS: 2 days   Sherri Sear, MD  03/22/2021, 5:25 PM    Note: This dictation was prepared with Dragon dictation along with smaller phrase technology. Any transcriptional errors that result from this process are unintentional.

## 2021-03-22 NOTE — Progress Notes (Signed)
Updated pt's daughter-in-law [name] via telephone on plan of care and pt's status today.  All questions answered.      Harlon Ditty, AGACNP-BC [facility] Prefer epic messenger for cross cover needs If after hours, please call E-link

## 2021-03-22 NOTE — Progress Notes (Signed)
NAME:  Virginia [name], MRN:  [id number], DOB:  [birth year], LOS: 2 ADMISSION DATE:  03/20/2021, CONSULTATION DATE: 03/20/2021 REFERRING MD: Dr. Vicente Males, CHIEF COMPLAINT: Unresponsiveness   History of Present Illness:  This is a 70 yo female with end stage COPD and chronic tracheostomy who presented to [facility] ER via EMS from home on 09/27 due to unresponsiveness.  Per ER notes EMS reported pts initial GCS was 3 and O2 sats were unreadable.  EMS provided bag ventilation via tracheostomy en route to the ER.  Pt also received 2 mg of narcan and initially became responsive.  Pts son informed EMS on the scene pts code status DNR, however there was no DNR paperwork readily available on the scene.  Pt recently hospitalized at [facility] from 08/28~09/19 following treatment of acute on chronic hypoxic hypercapnic respiratory failure secondary to AECOPD and enterobacter aerogenes bronchitis.    ED course Upon arrival to the ER pt unresponsive to verbal/painful stimulation with O2 sats initially in the mid 90's on RA.  However, pt immediately became hypoxic with O2 sats decreasing to 60% with shallow respirations on RA, requiring bag ventilation via tracheostomy.  Due to continued hypoxia pts #6 cuffless tracheostomy was changed to a #6 cuffed trach, and pt placed on mechanical ventilation via trach with O2 sats increasing to 92%.  Upon removal of cuffless trach RT noted pts inner canula was partially occluded by dried secretions.  VBG revealed pH 7.15/pCO2 118/acid-base excess 9.2/bicarb 41.1.  CXR concerning for bilateral trace pleural effusions and emphysema.  Code sepsis activated by ER physician pt received 1L LR bolus, ceftriaxone, azithromycin, and vancomycin due to concern of pneumonia.  PCCM team contacted for ICU admission.   Pertinent  Medical History  Atrial Fibrillation on Eliquis C. Difficile Severe COPD Chronic tracheostomy Anxiety & depression Hepatitis  C Arthritis HTN Fibromyalgia Anemia CAD Thoracic AAA  Significant Hospital Events: Including procedures, antibiotic start and stop dates in addition to other pertinent events   09/27: Pt admitted to ICU with acute on chronic hypoxic hypercapnic respiratory failure secondary to end stage COPD requiring mechanical ventilation via chronic tracheostomy  09/29: Tolerating PSV, will attempt TCT as tolerated  Interim History / Subjective:  -No acute events reported overnight -Afebrile, hemodynamically stable, no vasopressors -Currently tolerating PSV ~ will attempt TCT as tolerated -She reports SOB (baseline) and back pain -Pt denies any episodes of Melena last night, Hgb does continue to slowly trend down to 7.3 (7.9 previously) -GI to see today  Objective   Blood pressure (!) 149/98, pulse 99, temperature 98.6 F (37 C), temperature source Oral, resp. rate (!) 21, height 5' (1.524 m), weight 55.4 kg, SpO2 96 %.    Vent Mode: PSV FiO2 (%):  [28 %-40 %] 28 % Set Rate:  [14 bmp] 14 bmp Vt Set:  [450 mL] 450 mL PEEP:  [5 cmH20] 5 cmH20 Pressure Support:  [10 cmH20-12 cmH20] 12 cmH20   Intake/Output Summary (Last 24 hours) at 03/22/2021 0726 Last data filed at 03/22/2021 0600 Gross per 24 hour  Intake 74.82 ml  Output 410 ml  Net -335.18 ml    Filed Weights   03/20/21 1528 03/20/21 2215 03/22/21 0500  Weight: 54 kg 51.3 kg 55.4 kg   Examination: General: chronically ill appearing frail female, NAD mechanically ventilated via chronic tracheostomy HENT: #6 [place]  midline chronic tracheostomy, no JVD  Lungs: coarse breath sounds throughout, no wheezing or rales noted, even, non labored  Cardiovascular: sinus rhythm, no R/G, 2+  radial/1+ distal pulses trace bilateral lower extremity edema  Abdomen: +BS x4, soft, non tender, non distended  Extremities: normal tone, no edema  Neuro: Sleeping, arouses easily to voice, following commands, pupils PERRL GU: External catheter in  place  Resolved Hospital Problem list     Assessment & Plan:  Acute on chronic hypoxic hypercapnic respiratory failure secondary to AECOPD and mucous plugging & questionable underlying pneumonia Mechanical ventilation via chronic tracheostomy  Hx: Chronic Home O2 @4 .5~5L during the day and trilogy with capped tracheostomy qhs -Full vent support, implement lung protective strategies -Plateau pressures less than 30 cm H20 -Wean FiO2 & PEEP as tolerated to maintain O2 sats >88% -Follow intermittent Chest X-ray & ABG as needed -Currently tolerating PSV ~ will plan for Trach collar trials as tolerated -May required ventilator support qhs -Implement VAP Bundle -Scheduled & Prn Bronchodilators -IV and Nebulized steroids ~ will wean Solumedrol to 20 mg daily today 9/29 - Ensure adequate pulmonary hygiene   Mildly elevated troponin likely secondary to demand ischemia  Chronic atrial fibrillation on Eliquis SVT  Hx: HTN and CAD  - Continuous telemetry monitoring  - Hold Eliquis in the setting of GI bleed - Hold BP meds in the setting of hypotension -TSH low, but Free T4 normal - Prn metoprolol for hr >115   Lactic acidosis>>resolved -Trend BMP and lactic acid   Elevated liver enzymes likely secondary to hepatitis C hx  -Trend hepatic function panel   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) - Dietitian consulted to assess nutritional needs appreciate input   Acute Blood Loss anemia due to GI Bleed superimposed on Anemia of Chronic Disease Patient with dark tarry stools that were Hemoccult positive Hx: Anemia -Monitor for S/Sx of bleeding -Trend CBC (H&H q6) -SCD's for VTE Prophylaxis (holding home Eliquis) -Transfuse for Hgb <7 - Pantoprazole 40mg  IV BID - NPO for pending endoscopy - GI Consult for EGD/Colonoscopy - Hold NSAIDs, Anticoagulation, ASA  Acute encephalopathy secondary to hypercapnia >>resolved Hx: Anxiety and  depression  - Maintain RASS goal 0 to -1 - Fentanyl gtt as needed to maintain RASS goal - Will minimize sedating medications for now  - Once able to pass swallow evaluation can resume home amitriptyline, diazepam, gabapentin, and seroquel for now  - Urine drug screen positive for Benzodiazepines - CT head negative for acute intracranial abnormality  Best Practice (right click and "Reselect all SmartList Selections" daily)   Diet/type: NPO DVT prophylaxis: SCD's GI prophylaxis: PPI Lines: N/A Foley: N/A Code Status:  full code Last date of multidisciplinary goals of care discussion [03/22/2021]   Labs   CBC: Recent Labs  Lab 03/20/21 1510 03/20/21 2246 03/21/21 0509 03/21/21 0805 03/21/21 1518 03/21/21 2034 03/22/21 0328  WBC 6.5  --  7.3  --   --   --  8.1  NEUTROABS 5.2  --   --   --   --   --  6.5  HGB 9.6*   < > 8.2* 8.4* 8.2* 7.9* 7.3*  HCT 33.6*   < > 26.8* 26.8* 25.8* 25.2* 23.9*  MCV 104.7*  --  100.8*  --   --   --  97.2  PLT 366  --  333  --   --   --  301   < > = values in this interval not displayed.     Basic Metabolic Panel: Recent Labs  Lab 03/20/21 1510 03/21/21 0509 03/21/21 1518 03/22/21 0328  NA 142 142  --   --  K 4.2 3.8  --   --   CL 92* 95*  --   --   CO2 37* 29  --   --   GLUCOSE 133* 135*  --   --   BUN 15 15  --   --   CREATININE 0.86 0.96  --   --   CALCIUM 9.0 8.7*  --   --   MG  --  1.4* 2.0 2.1  PHOS  --  1.2* 5.0*  --     GFR: Estimated Creatinine Clearance: 42.6 mL/min (by C-G formula based on SCr of 0.96 mg/dL). Recent Labs  Lab 03/20/21 1510 03/20/21 1753 03/21/21 0509 03/21/21 0613 03/21/21 2034 03/22/21 0328  PROCALCITON  --  0.42 0.64  --   --  0.35  WBC 6.5  --  7.3  --   --  8.1  LATICACIDVEN 3.4* 3.2*  --  2.3* 1.1  --      Liver Function Tests: Recent Labs  Lab 03/20/21 1510 03/21/21 0805  AST 73* 44*  ALT 46* 43  ALKPHOS 37* 32*  BILITOT 0.9 16.1*  PROT 6.4* 5.4*  ALBUMIN 3.3* 3.0*    No  results for input(s): LIPASE, AMYLASE in the last 168 hours. No results for input(s): AMMONIA in the last 168 hours.  ABG    Component Value Date/Time   PHART 7.50 (H) 03/21/2021 0525   PCO2ART 40 03/21/2021 0525   PO2ART 120 (H) 03/21/2021 0525   HCO3 31.2 (H) 03/21/2021 0525   O2SAT 99.0 03/21/2021 0525     Coagulation Profile: Recent Labs  Lab 03/20/21 1510  INR 1.1     Cardiac Enzymes: No results for input(s): CKTOTAL, CKMB, CKMBINDEX, TROPONINI in the last 168 hours.  HbA1C: Hgb A1c MFr Bld  Date/Time Value Ref Range Status  01/08/2021 06:28 AM 4.9 4.8 - 5.6 % Final    Comment:    (NOTE) Pre diabetes:          5.7%-6.4%  Diabetes:              >6.4%  Glycemic control for   <7.0% adults with diabetes   06/06/2020 12:07 PM 5.3 4.8 - 5.6 % Final    Comment:    (NOTE) Pre diabetes:          5.7%-6.4%  Diabetes:              >6.4%  Glycemic control for   <7.0% adults with diabetes     CBG: Recent Labs  Lab 03/21/21 0741 03/21/21 1140 03/21/21 1534 03/21/21 1914 03/22/21 0302  GLUCAP 152* 148* 175* 158* 109*    Review of Systems: Positives in BOLD   Gen: severe anxiety, fever, chills, weight change, fatigue, night sweats HEENT: Denies blurred vision, double vision, hearing loss, tinnitus, sinus congestion, rhinorrhea, sore throat, neck stiffness, dysphagia PULM: shortness of breath, cough, sputum production, hemoptysis, wheezing CV: Denies chest pain, edema, orthopnea, paroxysmal nocturnal dyspnea, palpitations GI: Denies abdominal pain, nausea, vomiting, diarrhea, hematochezia, melena, constipation, change in bowel habits GU: Denies dysuria, hematuria, polyuria, oliguria, urethral discharge Endocrine: Denies hot or cold intolerance, polyuria, polyphagia or appetite change Derm: Denies rash, dry skin, scaling or peeling skin change Heme: Denies easy bruising, bleeding, bleeding gums Neuro: Denies headache, numbness, weakness, slurred speech, loss  of memory or consciousness  Past Medical History:  She,  has a past medical history of Anemia, Anxiety, Arthritis, Atrial fibrillation (HCC), C. difficile colitis (09/2015), COPD (chronic obstructive pulmonary disease) (HCC),  Depression, Dyspnea, Dysrhythmia, Fibromyalgia, H/O tracheostomy, Hep C w/ coma, chronic, Hypertension, MRSA pneumonia (HCC) (2017), On home oxygen therapy, Osteoporosis, Peripheral neuropathy, RLS (restless legs syndrome), S/P percutaneous endoscopic gastrostomy (PEG) tube placement (HCC) (09/2015), and Ventilator associated pneumonia (HCC) (10/2015).   Surgical History:   Past Surgical History:  Procedure Laterality Date   ABDOMINAL HYSTERECTOMY     BREAST SURGERY Bilateral    Breast Implants   DILATION AND CURETTAGE OF UTERUS     PEG PLACEMENT N/A 06/21/2020   Procedure: PERCUTANEOUS ENDOSCOPIC GASTROSTOMY (PEG) PLACEMENT;  Surgeon: Regis Bill, MD;  Location: [facility] ENDOSCOPY;  Service: Endoscopy;  Laterality: N/A;   REVERSE SHOULDER ARTHROPLASTY Right 03/06/2017   Procedure: REVERSE SHOULDER ARTHROPLASTY;  Surgeon: Christena Flake, MD;  Location: [facility] ORS;  Service: Orthopedics;  Laterality: Right;   ROTATOR CUFF REPAIR Bilateral    TRACHEOSTOMY TUBE PLACEMENT N/A 06/15/2020   Procedure: TRACHEOSTOMY;  Surgeon: Geanie Logan, MD;  Location: [facility] ORS;  Service: ENT;  Laterality: N/A;     Social History:   reports that she quit smoking about 5 years ago. Her smoking use included cigarettes. She smoked an average of 1.5 packs per day. She has never used smokeless tobacco. She reports that she does not drink alcohol and does not use drugs.   Family History:  Her family history includes Hypertension in her mother.   Allergies No Known Allergies   Home Medications  Prior to Admission medications   Medication Sig Start Date End Date Taking? Authorizing Provider  albuterol (VENTOLIN HFA) 108 (90 Base) MCG/ACT inhaler Inhale 2 puffs into the lungs every 4 (four)  hours as needed for wheezing or shortness of breath.    [provider]  amitriptyline (ELAVIL) 25 MG tablet Take 25 mg by mouth at bedtime.  01/14/19   [provider]  apixaban (ELIQUIS) 5 MG TABS tablet Take 1 tablet (5 mg total) by mouth 2 (two) times daily. 11/10/16   Adrian Saran, MD  budesonide (PULMICORT) 0.5 MG/2ML nebulizer solution Take 0.5 mg by nebulization daily. 11/28/20   [provider]  diazepam (VALIUM) 5 MG tablet Take 1-1.5 tablets (5-7.5 mg total) by mouth every 8 (eight) hours as needed for anxiety. 03/12/21   Osvaldo Shipper, MD  diltiazem (CARDIZEM CD) 120 MG 24 hr capsule Take 120 mg by mouth daily. 04/29/19   [provider]  estradiol (ESTRACE) 1 MG tablet Take 1 mg by mouth daily. 09/10/16   [provider]  gabapentin (NEURONTIN) 300 MG capsule Take 300 mg by mouth 3 (three) times daily. 05/06/20   [provider]  ipratropium-albuterol (DUONEB) 0.5-2.5 (3) MG/3ML SOLN Take 3 mLs by nebulization every 6 (six) hours. 02/23/21   Salena Saner, MD  iron polysaccharides (NIFEREX) 150 MG capsule Take 1 capsule (150 mg total) by mouth daily. 01/12/21 04/12/21  Gillis Santa, MD  MAGNESIUM-OXIDE 400 (241.3 Mg) MG tablet Take 400 mg by mouth daily.  08/24/16   [provider]  methocarbamol (ROBAXIN) 500 MG tablet Take 500 mg by mouth 2 (two) times daily. 12/27/20   [provider]  metoprolol tartrate (LOPRESSOR) 25 MG tablet Take 1 tablet (25 mg total) by mouth 2 (two) times daily. 03/12/21 06/10/21  Osvaldo Shipper, MD  omeprazole (PRILOSEC) 40 MG capsule Take 40 mg by mouth daily. 04/16/19   [provider]  predniSONE (DELTASONE) 5 MG tablet Take 5 mg by mouth daily. 12/25/20   [provider]  QUEtiapine (SEROQUEL) 25 MG tablet  Take 25 mg by mouth at bedtime.  10/01/16   [provider]  rosuvastatin (CRESTOR) 10 MG tablet Take 10 mg by mouth at bedtime. 10/23/20   [provider]   vitamin B-12 100 MCG tablet Take 1 tablet (100 mcg total) by mouth daily. 01/12/21 04/12/21  Gillis Santa, MD  Vitamin D, Ergocalciferol, (DRISDOL) 1.25 MG (50000 UNIT) CAPS capsule Take 1 capsule (50,000 Units total) by mouth every 7 (seven) days. 01/17/21 04/17/21  Gillis Santa, MD     Critical care time: 38 minutes     Harlon Ditty, AGACNP-BC [facility] Prefer epic messenger for cross cover needs If after hours, please call E-link

## 2021-03-22 NOTE — Progress Notes (Signed)
PHARMACY CONSULT NOTE - FOLLOW UP  Pharmacy Consult for Electrolyte Monitoring and Replacement   Recent Labs: Potassium (mmol/L)  Date Value  03/22/2021 3.7   Magnesium (mg/dL)  Date Value  13/01/6577 2.1   Calcium (mg/dL)  Date Value  46/96/2952 8.6 (L)   Albumin (g/dL)  Date Value  84/13/2440 3.0 (L)   Phosphorus (mg/dL)  Date Value  04/20/2535 5.0 (H)   Sodium (mmol/L)  Date Value  03/22/2021 142     Assessment: 71 year old female presented to the ED unresponsive. Patient required mechanical ventilation via chronic trach. Respiratory failure s/t end stage COPD. Patient with dark tarry stools that were hemoccult positive in the ED, concerning for GIB. Pharmacy consult to manage electrolytes.  Goal of Therapy:  Electrolytes WNL  Plan:  --No electrolyte replacement indicated today --Continue to follow  Pricilla Riffle, PharmD, BCPS Clinical Pharmacist 03/22/2021 10:54 AM

## 2021-03-23 ENCOUNTER — Inpatient Hospital Stay: Payer: Medicare Other

## 2021-03-23 DIAGNOSIS — J9601 Acute respiratory failure with hypoxia: Secondary | ICD-10-CM | POA: Diagnosis not present

## 2021-03-23 DIAGNOSIS — J9622 Acute and chronic respiratory failure with hypercapnia: Secondary | ICD-10-CM | POA: Diagnosis not present

## 2021-03-23 DIAGNOSIS — J9602 Acute respiratory failure with hypercapnia: Secondary | ICD-10-CM | POA: Diagnosis not present

## 2021-03-23 DIAGNOSIS — J9621 Acute and chronic respiratory failure with hypoxia: Secondary | ICD-10-CM | POA: Diagnosis not present

## 2021-03-23 LAB — COMPREHENSIVE METABOLIC PANEL
ALT: 30 U/L (ref 0–44)
AST: 27 U/L (ref 15–41)
Albumin: 3.3 g/dL — ABNORMAL LOW (ref 3.5–5.0)
Alkaline Phosphatase: 29 U/L — ABNORMAL LOW (ref 38–126)
Anion gap: 10 (ref 5–15)
BUN: 11 mg/dL (ref 8–23)
CO2: 36 mmol/L — ABNORMAL HIGH (ref 22–32)
Calcium: 8.7 mg/dL — ABNORMAL LOW (ref 8.9–10.3)
Chloride: 90 mmol/L — ABNORMAL LOW (ref 98–111)
Creatinine, Ser: 0.57 mg/dL (ref 0.44–1.00)
GFR, Estimated: >60 mL/min (ref >60)
Glucose, Bld: 104 mg/dL — ABNORMAL HIGH (ref 70–99)
Potassium: 3.5 mmol/L (ref 3.5–5.1)
Sodium: 136 mmol/L (ref 135–145)
Total Bilirubin: 0.9 mg/dL (ref 0.3–1.2)
Total Protein: 5.9 g/dL — ABNORMAL LOW (ref 6.5–8.1)

## 2021-03-23 LAB — CBC
HCT: 27.6 % — ABNORMAL LOW (ref 36.0–46.0)
Hemoglobin: 8.7 g/dL — ABNORMAL LOW (ref 12.0–15.0)
MCH: 30.5 pg (ref 26.0–34.0)
MCHC: 31.5 g/dL (ref 30.0–36.0)
MCV: 96.8 fL (ref 80.0–100.0)
Platelets: 318 10*3/uL (ref 150–400)
RBC: 2.85 MIL/uL — ABNORMAL LOW (ref 3.87–5.11)
RDW: 15.9 % — ABNORMAL HIGH (ref 11.5–15.5)
WBC: 8.4 10*3/uL (ref 4.0–10.5)
nRBC: 0.4 % — ABNORMAL HIGH (ref 0.0–0.2)

## 2021-03-23 LAB — HEPATIC FUNCTION PANEL
ALT: 43 U/L (ref 0–44)
AST: 44 U/L — ABNORMAL HIGH (ref 15–41)
Albumin: 3 g/dL — ABNORMAL LOW (ref 3.5–5.0)
Alkaline Phosphatase: 32 U/L — ABNORMAL LOW (ref 38–126)
Bilirubin, Direct: <0.1 mg/dL (ref 0.0–0.2)
Total Bilirubin: 1.4 mg/dL — ABNORMAL HIGH (ref 0.3–1.2)
Total Protein: 5.4 g/dL — ABNORMAL LOW (ref 6.5–8.1)

## 2021-03-23 LAB — GLUCOSE, CAPILLARY
Glucose-Capillary: 102 mg/dL — ABNORMAL HIGH (ref 70–99)
Glucose-Capillary: 125 mg/dL — ABNORMAL HIGH (ref 70–99)

## 2021-03-23 LAB — IRON AND TIBC
Iron: 30 ug/dL (ref 28–170)
Saturation Ratios: 11 % (ref 10.4–31.8)
TIBC: 269 ug/dL (ref 250–450)
UIBC: 239 ug/dL

## 2021-03-23 LAB — FOLATE: Folate: 27 ng/mL (ref >5.9)

## 2021-03-23 LAB — FERRITIN: Ferritin: 227 ng/mL (ref 11–307)

## 2021-03-23 LAB — VITAMIN B12: Vitamin B-12: 399 pg/mL (ref 180–914)

## 2021-03-23 MED ORDER — APIXABAN 5 MG PO TABS
5.0000 mg | ORAL_TABLET | Freq: Two times a day (BID) | ORAL | Status: DC
  Administered 2021-03-24 – 2021-04-02 (×19): 5 mg
  Filled 2021-03-23 (×19): qty 1

## 2021-03-23 MED ORDER — MIDAZOLAM HCL 2 MG/2ML IJ SOLN
2.0000 mg | Freq: Once | INTRAMUSCULAR | Status: AC
  Administered 2021-03-23: 2 mg via INTRAVENOUS
  Filled 2021-03-23: qty 2

## 2021-03-23 MED ORDER — VITAL AF 1.2 CAL PO LIQD
1000.0000 mL | ORAL | Status: DC
  Administered 2021-03-23 – 2021-03-31 (×9): 1000 mL

## 2021-03-23 MED ORDER — VITAMIN B-12 100 MCG PO TABS
100.0000 ug | ORAL_TABLET | Freq: Every day | ORAL | Status: DC
  Administered 2021-03-24 – 2021-04-02 (×10): 100 ug
  Filled 2021-03-23 (×10): qty 1

## 2021-03-23 MED ORDER — HYDROCODONE-ACETAMINOPHEN 5-325 MG PO TABS
1.0000 | ORAL_TABLET | Freq: Four times a day (QID) | ORAL | Status: DC | PRN
  Administered 2021-03-23 – 2021-03-24 (×2): 2 via ORAL
  Filled 2021-03-23 (×2): qty 2

## 2021-03-23 MED ORDER — VITAMIN B-12 100 MCG PO TABS
100.0000 ug | ORAL_TABLET | Freq: Every day | ORAL | Status: DC
  Administered 2021-03-23: 100 ug via ORAL
  Filled 2021-03-23: qty 1

## 2021-03-23 MED ORDER — POLYSACCHARIDE IRON COMPLEX 150 MG PO CAPS
150.0000 mg | ORAL_CAPSULE | Freq: Every day | ORAL | Status: DC
  Administered 2021-03-24 – 2021-04-02 (×10): 150 mg
  Filled 2021-03-23 (×10): qty 1

## 2021-03-23 MED ORDER — DIAZEPAM 5 MG/ML IJ SOLN
5.0000 mg | Freq: Four times a day (QID) | INTRAMUSCULAR | Status: DC
  Administered 2021-03-23: 5 mg via INTRAVENOUS
  Filled 2021-03-23: qty 2

## 2021-03-23 MED ORDER — DOCUSATE SODIUM 50 MG/5ML PO LIQD: 100.0000 mg | Freq: Two times a day (BID) | ORAL | Status: DC | PRN

## 2021-03-23 MED ORDER — METHOCARBAMOL 500 MG PO TABS
500.0000 mg | ORAL_TABLET | Freq: Two times a day (BID) | ORAL | Status: DC
  Administered 2021-03-24 – 2021-04-02 (×19): 500 mg
  Filled 2021-03-23 (×21): qty 1

## 2021-03-23 MED ORDER — GABAPENTIN 300 MG PO CAPS
300.0000 mg | ORAL_CAPSULE | Freq: Three times a day (TID) | ORAL | Status: DC
  Administered 2021-03-23 (×2): 300 mg via ORAL
  Filled 2021-03-23 (×2): qty 1

## 2021-03-23 MED ORDER — APIXABAN 5 MG PO TABS
5.0000 mg | ORAL_TABLET | Freq: Two times a day (BID) | ORAL | Status: DC
  Administered 2021-03-23: 5 mg via ORAL
  Filled 2021-03-23: qty 1

## 2021-03-23 MED ORDER — ROSUVASTATIN CALCIUM 10 MG PO TABS
10.0000 mg | ORAL_TABLET | Freq: Every day | ORAL | Status: DC
  Administered 2021-03-24 – 2021-04-01 (×9): 10 mg
  Filled 2021-03-23 (×9): qty 1

## 2021-03-23 MED ORDER — FREE WATER
60.0000 mL | Status: DC
  Administered 2021-03-23 – 2021-04-02 (×61): 60 mL

## 2021-03-23 MED ORDER — METOPROLOL TARTRATE 25 MG PO TABS
25.0000 mg | ORAL_TABLET | Freq: Two times a day (BID) | ORAL | Status: DC
  Administered 2021-03-23: 25 mg via ORAL
  Filled 2021-03-23: qty 1

## 2021-03-23 MED ORDER — FENTANYL CITRATE PF 50 MCG/ML IJ SOSY
50.0000 ug | PREFILLED_SYRINGE | Freq: Once | INTRAMUSCULAR | Status: AC
  Administered 2021-03-23: 50 ug via INTRAVENOUS
  Filled 2021-03-23: qty 1

## 2021-03-23 MED ORDER — DIAZEPAM 5 MG PO TABS
5.0000 mg | ORAL_TABLET | Freq: Four times a day (QID) | ORAL | Status: DC
  Administered 2021-03-23 – 2021-03-26 (×11): 5 mg
  Filled 2021-03-23 (×11): qty 1

## 2021-03-23 MED ORDER — MORPHINE SULFATE (PF) 2 MG/ML IV SOLN
2.0000 mg | INTRAVENOUS | Status: DC | PRN
  Administered 2021-03-24 – 2021-03-25 (×4): 2 mg via INTRAVENOUS
  Filled 2021-03-23 (×5): qty 1

## 2021-03-23 MED ORDER — METHOCARBAMOL 500 MG PO TABS
500.0000 mg | ORAL_TABLET | Freq: Two times a day (BID) | ORAL | Status: DC
  Administered 2021-03-23: 500 mg via ORAL
  Filled 2021-03-23 (×2): qty 1

## 2021-03-23 MED ORDER — FREE WATER: 30.0000 mL | Status: DC

## 2021-03-23 MED ORDER — METOPROLOL TARTRATE 25 MG PO TABS
25.0000 mg | ORAL_TABLET | Freq: Two times a day (BID) | ORAL | Status: DC
  Administered 2021-03-24 – 2021-04-02 (×19): 25 mg
  Filled 2021-03-23 (×19): qty 1

## 2021-03-23 MED ORDER — QUETIAPINE FUMARATE 25 MG PO TABS
25.0000 mg | ORAL_TABLET | Freq: Every day | ORAL | Status: DC
  Administered 2021-03-23: 25 mg via ORAL
  Filled 2021-03-23: qty 1

## 2021-03-23 MED ORDER — GABAPENTIN 300 MG PO CAPS
300.0000 mg | ORAL_CAPSULE | Freq: Three times a day (TID) | ORAL | Status: DC
  Administered 2021-03-24 – 2021-04-02 (×29): 300 mg
  Filled 2021-03-23 (×28): qty 1

## 2021-03-23 MED ORDER — MAGNESIUM OXIDE -MG SUPPLEMENT 400 (240 MG) MG PO TABS
400.0000 mg | ORAL_TABLET | Freq: Every day | ORAL | Status: DC
  Administered 2021-03-23: 400 mg via ORAL
  Filled 2021-03-23: qty 1

## 2021-03-23 MED ORDER — ROSUVASTATIN CALCIUM 10 MG PO TABS
10.0000 mg | ORAL_TABLET | Freq: Every day | ORAL | Status: DC
  Administered 2021-03-23: 10 mg via ORAL
  Filled 2021-03-23: qty 1

## 2021-03-23 MED ORDER — QUETIAPINE FUMARATE 25 MG PO TABS
25.0000 mg | ORAL_TABLET | Freq: Every day | ORAL | Status: DC
  Administered 2021-03-24 – 2021-04-01 (×9): 25 mg
  Filled 2021-03-23 (×9): qty 1

## 2021-03-23 MED ORDER — MAGNESIUM OXIDE -MG SUPPLEMENT 400 (240 MG) MG PO TABS
400.0000 mg | ORAL_TABLET | Freq: Every day | ORAL | Status: DC
  Administered 2021-03-24 – 2021-04-02 (×10): 400 mg
  Filled 2021-03-23 (×10): qty 1

## 2021-03-23 MED ORDER — POLYSACCHARIDE IRON COMPLEX 150 MG PO CAPS
150.0000 mg | ORAL_CAPSULE | Freq: Every day | ORAL | Status: DC
  Administered 2021-03-23: 150 mg via ORAL
  Filled 2021-03-23 (×2): qty 1

## 2021-03-23 NOTE — Progress Notes (Signed)
Both this RN and June Leap RN attempted but were unable to place dobhoff small bore feeding tube despite patient cooperation and pre medications. Both nostrils attempted and unable to advance beyond 10-20 cm. Medical team notified.

## 2021-03-23 NOTE — Progress Notes (Signed)
SLP Cancellation Note  Patient Details Name: Virginia [name] MRN: [id number] DOB: 02/17/1951   Cancelled treatment:       Reason Eval/Treat Not Completed: Medical issues which prohibited therapy;Patient not medically ready (chart reviewed; consulted NSG re: status). Per NSG, pt did Not tolerate her TCT wean this morning -- increased anxiety. Currently, she is vent support. RT/MD will attempt further wean to TCT as tolerated. Discussed w/ NSG to address anti-anxiety meds PRIOR to her TCT weans, and during wear.  ST services will f/u tomorrow to see how TCT wean is progressing -- pt will need to demonstrate toleration of TCT comfortably for ~4 hours b/f attempting to use PMV, progress to meals. NSG agreed.       Jerilynn Som, MS, CCC-SLP Speech Language Pathologist Rehab Services [phone] [facility] 03/23/2021, 10:23 AM

## 2021-03-23 NOTE — Progress Notes (Addendum)
Initial Nutrition Assessment  DOCUMENTATION CODES:  Non-severe (moderate) malnutrition in context of chronic illness  INTERVENTION:  Advance diet as able once weaned to trach collar Once diet is in place, add ensure Enlive strawberry TID Monitor for signs of refeeding Recommend starting TF via NGT once in place Vital AF 1.2 @ 82mL/h (1.08L total volume) 77mL free water q4h This will provide 1296 kcal, 81g of protein, and of free water   NUTRITION DIAGNOSIS:  Moderate Malnutrition (in the context of chronic illness (COPD)) related to poor appetite as evidenced by mild muscle depletion, moderate fat depletion, mild fat depletion, moderate muscle depletion.  GOAL:  Provide needs based on ASPEN/SCCM guidelines  MONITOR:  TF tolerance, Diet advancement, Vent status, Labs  REASON FOR ASSESSMENT:  Ventilator, Malnutrition Screening Tool    ASSESSMENT:  70 y.o. 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 fib, fibromyalgia, HTN, osteoporosis, and Hep C brought to ED via EMS from home with hypoxia after being found unresponsive at home. [facility]  Pt well known to nutrition department from previous admissions.   Patient is currently intubated on ventilator support via tracheostomy. Unable to tolerate weaning trials due to anxiety and hypoxia. Discussed in rounds, plan is to place DHT today for easier administration of medication and as a means of providing nutrition until pt can tolerate a PMV. RN attempted at bedside this AM, but unable to advance tube.  Pt just returning from IR at the time of assessment. NGT in place. Discussed nutrition plan with pt. Pt likes ensure supplements, will add to order once diet is able to be advanced.     MV: 10 L/min Temp (24hrs), Avg:98.4 F (36.9 C), Min:98 F (36.7 C), Max:98.6 F (37 C)   Intake/Output Summary (Last 24 hours) at 03/23/2021 1441 Last data filed at 03/23/2021 0900 Gross per 24 hour   Intake 4.3 ml  Output 1950 ml  Net -1945.7 ml  Net IO Since Admission: -1,099.14 mL [03/23/21 1441]  Nutritionally Relevant Medications: Scheduled Meds:  docusate  100 mg Per Tube BID   methylPREDNISolone injection  20 mg Intravenous Q24H   pantoprazole (PROTONIX) IV  40 mg Intravenous Q12H   polyethylene glycol  17 g Per Tube Daily   Continuous Infusions:  sodium chloride     PRN Meds: docusate sodium  Labs Reviewed  NUTRITION - FOCUSED PHYSICAL EXAM: Flowsheet Row Most Recent Value  Orbital Region Moderate depletion  Upper Arm Region Mild depletion  Thoracic and Lumbar Region Mild depletion  Buccal Region Moderate depletion  Temple Region Moderate depletion  Clavicle Bone Region Moderate depletion  Clavicle and Acromion Bone Region Moderate depletion  Scapular Bone Region Moderate depletion  Dorsal Hand Mild depletion  Patellar Region Mild depletion  Anterior Thigh Region Mild depletion  Posterior Calf Region Mild depletion  Edema (RD Assessment) Mild  Hair Reviewed  Eyes Reviewed  Mouth Reviewed  Skin Reviewed  Nails Reviewed   Diet Order:   Diet Order             Diet NPO time specified  Diet effective now                   EDUCATION NEEDS:  No education needs have been identified at this time  Skin:  Skin Assessment: Reviewed RN Assessment  Last BM:  unsure  Height:  Ht Readings from Last 1 Encounters:  03/20/21 5' (1.524 m)    Weight:  Wt Readings from Last 1 Encounters:  03/23/21 55 kg    Ideal Body Weight:  45.5 kg  BMI:  Body mass index is 23.68 kg/m.  Estimated Nutritional Needs:  Kcal:  1234 kcal/d Protein:  70-80 g/d Fluid:  1.5-1.8 L/d   Greig Castilla, RD, LDN Clinical Dietitian Pager on Amion

## 2021-03-23 NOTE — Progress Notes (Signed)
NAME:  Virginia [name], MRN:  [id number], DOB:  [birth year], LOS: 3 ADMISSION DATE:  03/20/2021, CONSULTATION DATE: 03/20/2021 REFERRING MD: Dr. Vicente Males, CHIEF COMPLAINT: Unresponsiveness   History of Present Illness:  This is a 70 yo female with end stage COPD and chronic tracheostomy who presented to [facility] ER via EMS from home on 09/27 due to unresponsiveness.  Per ER notes EMS reported pts initial GCS was 3 and O2 sats were unreadable.  EMS provided bag ventilation via tracheostomy en route to the ER.  Pt also received 2 mg of narcan and initially became responsive.  Pts son informed EMS on the scene pts code status DNR, however there was no DNR paperwork readily available on the scene.  Pt recently hospitalized at [facility] from 08/28~09/19 following treatment of acute on chronic hypoxic hypercapnic respiratory failure secondary to AECOPD and enterobacter aerogenes bronchitis.    ED course Upon arrival to the ER pt unresponsive to verbal/painful stimulation with O2 sats initially in the mid 90's on RA.  However, pt immediately became hypoxic with O2 sats decreasing to 60% with shallow respirations on RA, requiring bag ventilation via tracheostomy.  Due to continued hypoxia pts #6 cuffless tracheostomy was changed to a #6 cuffed trach, and pt placed on mechanical ventilation via trach with O2 sats increasing to 92%.  Upon removal of cuffless trach RT noted pts inner canula was partially occluded by dried secretions.  VBG revealed pH 7.15/pCO2 118/acid-base excess 9.2/bicarb 41.1.  CXR concerning for bilateral trace pleural effusions and emphysema.  Code sepsis activated by ER physician pt received 1L LR bolus, ceftriaxone, azithromycin, and vancomycin due to concern of pneumonia.  PCCM team contacted for ICU admission.   Pertinent  Medical History  Atrial Fibrillation on Eliquis C. Difficile Severe COPD Chronic tracheostomy Anxiety & depression Hepatitis  C Arthritis HTN Fibromyalgia Anemia CAD Thoracic AAA  Significant Hospital Events: Including procedures, antibiotic start and stop dates in addition to other pertinent events   09/27: Pt admitted to ICU with acute on chronic hypoxic hypercapnic respiratory failure secondary to end stage COPD requiring mechanical ventilation via chronic tracheostomy  09/29: Tolerating PSV, will attempt TCT as tolerated 09/30: Unable to tolerate weaning pressures on PSV (immediately becomes extremely anxious and SOB with hypooxia), add scheduled Valium, plan to place Dobhoff for enteral access  Interim History / Subjective:  -No acute events reported overnight -Discussed with respiratory therapy, this morning when pressures on PSV weaned down, pt immediately becomes SOB and extremely anxious with severe hypoxia (O2 sats 60's)~ will add scheduled Valium to assist with anxiety -Afebrile, hemodynamically stable, UOP 1.95 L over last 24 hrs (net - 1.1 L) -No reports of GI Bleeding -Morning labs are currently pending -Will attempt to place Dobhoff for enteral access and nutrition since unable to wean from vent  Objective   Blood pressure (!) 144/86, pulse 80, temperature 98.4 F (36.9 C), temperature source Oral, resp. rate 14, height 5' (1.524 m), weight 55 kg, SpO2 94 %.    Vent Mode: PRVC FiO2 (%):  [28 %-30 %] 28 % Set Rate:  [14 bmp] 14 bmp Vt Set:  [450 mL] 450 mL PEEP:  [5 cmH20] 5 cmH20 Pressure Support:  [12 cmH20-14 cmH20] 14 cmH20   Intake/Output Summary (Last 24 hours) at 03/23/2021 0910 Last data filed at 03/23/2021 0600 Gross per 24 hour  Intake --  Output 1950 ml  Net -1950 ml    Filed Weights   03/20/21 2215 03/22/21 0500 03/23/21 0500  Weight: 51.3 kg 55.4 kg 55 kg   Examination: General: chronically ill appearing frail female, NAD, mechanically ventilated via chronic tracheostomy HENT: #6 [place]iley  midline chronic tracheostomy, no JVD  Lungs: mechanical breath sounds throughout,  no wheezing or rales noted, synchronous with vent, even, non labored  Cardiovascular: Tachycardia, regular rhythm, no R/G, 2+ radial/1+ distal pulses trace bilateral lower extremity edema  Abdomen: +BS x4, soft, non tender, non distended  Extremities: normal tone, no edema  Neuro: Sleeping (recently received Valium for anxiety), arouses easily to voice, following commands, no focal deficits, mouthing words and needs, pupils PERRL GU: External catheter in place  Resolved Hospital Problem list     Assessment & Plan:  Acute on chronic hypoxic hypercapnic respiratory failure secondary to AECOPD and mucous plugging & questionable underlying pneumonia Mechanical ventilation via chronic tracheostomy  Hx: Chronic Home O2 @4 .5~5L during the day and trilogy with capped tracheostomy qhs -Full vent support, implement lung protective strategies -Plateau pressures less than 30 cm H20 -Wean FiO2 & PEEP as tolerated to maintain O2 sats >88% -Follow intermittent Chest X-ray & ABG as needed -Unable to tolerate PSV this morning ~ will attempt SBT's and trach collar trials as tolerated -May require ventilator support qhs -Implement VAP Bundle -Scheduled & Prn Bronchodilators -IV and Nebulized steroids - Ensure adequate pulmonary hygiene   Mildly elevated troponin likely secondary to demand ischemia  Chronic atrial fibrillation on Eliquis SVT  Hx: HTN and CAD  - Continuous telemetry monitoring  - Hold Eliquis in the setting of GI bleed - Can resume home Metoprolol, Cardizem, and Lisinopril once able to establish enteral access -TSH low, but Free T4 normal - Prn metoprolol for hr >115   Lactic acidosis>>resolved -Trend BMP and lactic acid   Elevated liver enzymes likely secondary to hepatitis C hx  -Trend hepatic function panel   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) - Dietitian consulted to assess nutritional needs appreciate input  -Place Dobhoff  tube 03/23/21  Acute Blood Loss anemia due to GI Bleed superimposed on Anemia of Chronic Disease Patient with dark tarry stools that were Hemoccult positive Hx: Anemia -Monitor for S/Sx of bleeding -Trend CBC -SCD's for VTE Prophylaxis (per GI can resume Eliquis once able to tolerate PO) -Transfuse for Hgb <7 - Pantoprazole 40mg  IV BID - Hold NSAIDs, Anticoagulation, ASA -GI evaluated ~ patient declined EGD, recommends colonoscopy either inpatient or outpatient if pt is agreeable, ok to restart Eliquis and start diet once enteral access obtained  Acute encephalopathy secondary to hypercapnia >>resolved Anxiety/Panic Attacks with weaning Hx: Anxiety and depression  - Maintain RASS goal 0 to -1 - Will add scheduled Valium for now - Once able to pass swallow evaluation/enteral access obtained, can resume home amitriptyline, diazepam, gabapentin, and seroquel  - Urine drug screen positive for Benzodiazepines - CT head negative for acute intracranial abnormality  Best Practice (right click and "Reselect all SmartList Selections" daily)   Diet/type: NPO, start tube feeds once enteral access obtained DVT prophylaxis: SCD's GI prophylaxis: PPI Lines: N/A Foley: N/A Code Status:  full code Last date of multidisciplinary goals of care discussion [03/23/2021]   Labs   CBC: Recent Labs  Lab 03/20/21 1510 03/20/21 2246 03/21/21 0509 03/21/21 0805 03/21/21 2034 03/22/21 0328 03/22/21 0929 03/22/21 1501 03/22/21 2102  WBC 6.5  --  7.3  --   --  8.1  --   --   --   NEUTROABS 5.2  --   --   --   --  6.5  --   --   --   HGB 9.6*   < > 8.2*   < > 7.9* 7.3* 7.8* 8.3* 8.4*  HCT 33.6*   < > 26.8*   < > 25.2* 23.9* 24.8* 27.1* 26.8*  MCV 104.7*  --  100.8*  --   --  97.2  --   --   --   PLT 366  --  333  --   --  301  --   --   --    < > = values in this interval not displayed.     Basic Metabolic Panel: Recent Labs  Lab 03/20/21 1510 03/21/21 0509 03/21/21 1518 03/22/21 0328  03/22/21 0929  NA 142 142  --   --  142  K 4.2 3.8  --   --  3.7  CL 92* 95*  --   --  95*  CO2 37* 29  --   --  38*  GLUCOSE 133* 135*  --   --  103*  BUN 15 15  --   --  13  CREATININE 0.86 0.96  --   --  0.55  CALCIUM 9.0 8.7*  --   --  8.6*  MG  --  1.4* 2.0 2.1  --   PHOS  --  1.2* 5.0*  --   --     GFR: Estimated Creatinine Clearance: 50.9 mL/min (by C-G formula based on SCr of 0.55 mg/dL). Recent Labs  Lab 03/20/21 1510 03/20/21 1753 03/21/21 0509 03/21/21 0613 03/21/21 2034 03/22/21 0328  PROCALCITON  --  0.42 0.64  --   --  0.35  WBC 6.5  --  7.3  --   --  8.1  LATICACIDVEN 3.4* 3.2*  --  2.3* 1.1  --      Liver Function Tests: Recent Labs  Lab 03/20/21 1510 03/21/21 0805  AST 73* 44*  ALT 46* 43  ALKPHOS 37* 32*  BILITOT 0.9 16.1*  PROT 6.4* 5.4*  ALBUMIN 3.3* 3.0*    No results for input(s): LIPASE, AMYLASE in the last 168 hours. No results for input(s): AMMONIA in the last 168 hours.  ABG    Component Value Date/Time   PHART 7.50 (H) 03/21/2021 0525   PCO2ART 40 03/21/2021 0525   PO2ART 120 (H) 03/21/2021 0525   HCO3 31.2 (H) 03/21/2021 0525   O2SAT 99.0 03/21/2021 0525     Coagulation Profile: Recent Labs  Lab 03/20/21 1510  INR 1.1     Cardiac Enzymes: No results for input(s): CKTOTAL, CKMB, CKMBINDEX, TROPONINI in the last 168 hours.  HbA1C: Hgb A1c MFr Bld  Date/Time Value Ref Range Status  01/08/2021 06:28 AM 4.9 4.8 - 5.6 % Final    Comment:    (NOTE) Pre diabetes:          5.7%-6.4%  Diabetes:              >6.4%  Glycemic control for   <7.0% adults with diabetes   06/06/2020 12:07 PM 5.3 4.8 - 5.6 % Final    Comment:    (NOTE) Pre diabetes:          5.7%-6.4%  Diabetes:              >6.4%  Glycemic control for   <7.0% adults with diabetes     CBG: Recent Labs  Lab 03/22/21 0302 03/22/21 1121 03/22/21 1602 03/22/21 1955 03/22/21 2333  GLUCAP 109* 111* 155* 120* 101*  Review of Systems:  Positives in BOLD   Gen: severe anxiety, fever, chills, weight change, fatigue, night sweats HEENT: Denies blurred vision, double vision, hearing loss, tinnitus, sinus congestion, rhinorrhea, sore throat, neck stiffness, dysphagia PULM: shortness of breath, cough, sputum production, hemoptysis, wheezing CV: Denies chest pain, edema, orthopnea, paroxysmal nocturnal dyspnea, palpitations GI: Denies abdominal pain, nausea, vomiting, diarrhea, hematochezia, melena, constipation, change in bowel habits GU: Denies dysuria, hematuria, polyuria, oliguria, urethral discharge Endocrine: Denies hot or cold intolerance, polyuria, polyphagia or appetite change Derm: Denies rash, dry skin, scaling or peeling skin change Heme: Denies easy bruising, bleeding, bleeding gums Neuro: Denies headache, numbness, weakness, slurred speech, loss of memory or consciousness  Past Medical History:  She,  has a past medical history of Anemia, Anxiety, Arthritis, Atrial fibrillation (HCC), C. difficile colitis (09/2015), COPD (chronic obstructive pulmonary disease) (HCC), Depression, Dyspnea, Dysrhythmia, Fibromyalgia, H/O tracheostomy, Hep C w/ coma, chronic, Hypertension, MRSA pneumonia (HCC) (2017), On home oxygen therapy, Osteoporosis, Peripheral neuropathy, RLS (restless legs syndrome), S/P percutaneous endoscopic gastrostomy (PEG) tube placement (HCC) (09/2015), and Ventilator associated pneumonia (HCC) (10/2015).   Surgical History:   Past Surgical History:  Procedure Laterality Date   ABDOMINAL HYSTERECTOMY     BREAST SURGERY Bilateral    Breast Implants   DILATION AND CURETTAGE OF UTERUS     PEG PLACEMENT N/A 06/21/2020   Procedure: PERCUTANEOUS ENDOSCOPIC GASTROSTOMY (PEG) PLACEMENT;  Surgeon: Regis Bill, MD;  Location: [facility] ENDOSCOPY;  Service: Endoscopy;  Laterality: N/A;   REVERSE SHOULDER ARTHROPLASTY Right 03/06/2017   Procedure: REVERSE SHOULDER ARTHROPLASTY;  Surgeon: Christena Flake, MD;   Location: [facility] ORS;  Service: Orthopedics;  Laterality: Right;   ROTATOR CUFF REPAIR Bilateral    TRACHEOSTOMY TUBE PLACEMENT N/A 06/15/2020   Procedure: TRACHEOSTOMY;  Surgeon: Geanie Logan, MD;  Location: [facility] ORS;  Service: ENT;  Laterality: N/A;     Social History:   reports that she quit smoking about 5 years ago. Her smoking use included cigarettes. She smoked an average of 1.5 packs per day. She has never used smokeless tobacco. She reports that she does not drink alcohol and does not use drugs.   Family History:  Her family history includes Hypertension in her mother.   Allergies No Known Allergies   Home Medications  Prior to Admission medications   Medication Sig Start Date End Date Taking? Authorizing Provider  albuterol (VENTOLIN HFA) 108 (90 Base) MCG/ACT inhaler Inhale 2 puffs into the lungs every 4 (four) hours as needed for wheezing or shortness of breath.    [provider]  amitriptyline (ELAVIL) 25 MG tablet Take 25 mg by mouth at bedtime.  01/14/19   [provider]  apixaban (ELIQUIS) 5 MG TABS tablet Take 1 tablet (5 mg total) by mouth 2 (two) times daily. 11/10/16   Adrian Saran, MD  budesonide (PULMICORT) 0.5 MG/2ML nebulizer solution Take 0.5 mg by nebulization daily. 11/28/20   [provider]  diazepam (VALIUM) 5 MG tablet Take 1-1.5 tablets (5-7.5 mg total) by mouth every 8 (eight) hours as needed for anxiety. 03/12/21   Osvaldo Shipper, MD  diltiazem (CARDIZEM CD) 120 MG 24 hr capsule Take 120 mg by mouth daily. 04/29/19   [provider]  estradiol (ESTRACE) 1 MG tablet Take 1 mg by mouth daily. 09/10/16   [provider]  gabapentin (NEURONTIN) 300 MG capsule Take 300 mg by mouth 3 (three) times daily. 05/06/20   [provider]  ipratropium-albuterol (DUONEB) 0.5-2.5 (3) MG/3ML SOLN  Take 3 mLs by nebulization every 6 (six) hours. 02/23/21   Salena Saner, MD  iron polysaccharides (NIFEREX) 150 MG capsule Take  1 capsule (150 mg total) by mouth daily. 01/12/21 04/12/21  Gillis Santa, MD  MAGNESIUM-OXIDE 400 (241.3 Mg) MG tablet Take 400 mg by mouth daily.  08/24/16   [provider]  methocarbamol (ROBAXIN) 500 MG tablet Take 500 mg by mouth 2 (two) times daily. 12/27/20   [provider]  metoprolol tartrate (LOPRESSOR) 25 MG tablet Take 1 tablet (25 mg total) by mouth 2 (two) times daily. 03/12/21 06/10/21  Osvaldo Shipper, MD  omeprazole (PRILOSEC) 40 MG capsule Take 40 mg by mouth daily. 04/16/19   [provider]  predniSONE (DELTASONE) 5 MG tablet Take 5 mg by mouth daily. 12/25/20   [provider]  QUEtiapine (SEROQUEL) 25 MG tablet Take 25 mg by mouth at bedtime.  10/01/16   [provider]  rosuvastatin (CRESTOR) 10 MG tablet Take 10 mg by mouth at bedtime. 10/23/20   [provider]  vitamin B-12 100 MCG tablet Take 1 tablet (100 mcg total) by mouth daily. 01/12/21 04/12/21  Gillis Santa, MD  Vitamin D, Ergocalciferol, (DRISDOL) 1.25 MG (50000 UNIT) CAPS capsule Take 1 capsule (50,000 Units total) by mouth every 7 (seven) days. 01/17/21 04/17/21  Gillis Santa, MD     Critical care time: 35 minutes     Harlon Ditty, AGACNP-BC [facility] Prefer epic messenger for cross cover needs If after hours, please call E-link

## 2021-03-23 NOTE — Progress Notes (Signed)
PHARMACY CONSULT NOTE - FOLLOW UP  Pharmacy Consult for Electrolyte Monitoring and Replacement   Recent Labs: Potassium (mmol/L)  Date Value  03/23/2021 3.5   Magnesium (mg/dL)  Date Value  66/59/9357 2.1   Calcium (mg/dL)  Date Value  01/77/9390 8.7 (L)   Albumin (g/dL)  Date Value  30/02/2329 3.3 (L)   Phosphorus (mg/dL)  Date Value  07/62/2633 5.0 (H)   Sodium (mmol/L)  Date Value  03/23/2021 136     Assessment: 70 year old female presented to the ED unresponsive. Patient required mechanical ventilation via chronic trach. Respiratory failure s/t end stage COPD. Patient with dark tarry stools that were hemoccult positive in the ED, concerning for GIB. Pharmacy consult to manage electrolytes.  Goal of Therapy:  Electrolytes WNL  Plan:  --No electrolyte replacement indicated today --Continue to follow  Pricilla Riffle, PharmD, BCPS Clinical Pharmacist 03/23/2021 2:53 PM

## 2021-03-23 NOTE — Progress Notes (Signed)
Occupational Therapy Treatment Patient Details Name: Virginia [name] MRN: [id number] DOB: [birth year] Today's Date: 03/23/2021   History of present illness This is a 70 yo female with end stage COPD and chronic tracheostomy who presented to [facility] ER via EMS from home on 09/27 due to unresponsiveness. Patient recently discharged   OT comments  Upon entering the room, pt supine in bed and granddaughter present in the room. Pt with NG tube placed today for nutrition and pt appears frustrated about that but very pleasant and agreeable. Pt performs supine >sit with min A for trunk support. Pt maintained static sitting balance for ~ 10 minutes while granddaughter assisted her with combing out hair. RN gave pt medication for anxiety prior to therapist arrival. Pt on vent but all vitals remain stable during session which is a great improvement from yesterday. Pt does not report SOB but does report fatigue with activity. Pt returned to bed with min guard  and repositioned in bed for comfort. All needs within reach.    Recommendations for follow up therapy are one component of a multi-disciplinary discharge planning process, led by the attending physician.  Recommendations may be updated based on patient status, additional functional criteria and insurance authorization.    Follow Up Recommendations  Supervision/Assistance - 24 hour;SNF    Equipment Recommendations  None recommended by OT       Precautions / Restrictions Precautions Precautions: Fall Precaution Comments: pt on vent       Mobility Bed Mobility Overal bed mobility: Needs Assistance Bed Mobility: Supine to Sit;Sit to Supine Rolling: Min assist   Supine to sit: Min assist Sit to supine: Min guard   General bed mobility comments: Only requires management of lines and leads with trunk support    Transfers                      Balance Overall balance assessment: Needs assistance Sitting-balance support: Bilateral  upper extremity supported Sitting balance-Leahy Scale: Good Sitting balance - Comments: Good seated balance without UE assistance at EOB                                   ADL either performed or assessed with clinical judgement     Vision Patient Visual Report: No change from baseline            Cognition Arousal/Alertness: Awake/alert Behavior During Therapy: Anxious Overall Cognitive Status: Difficult to assess                                                     Pertinent Vitals/ Pain       Pain Assessment: Faces Faces Pain Scale: No hurt  Home Living   Living Arrangements: Alone                                          Frequency  Min 2X/week        Progress Toward Goals  OT Goals(current goals can now be found in the care plan section)  Progress towards OT goals: Progressing toward goals  Acute Rehab OT Goals Patient Stated Goal: none stated OT Goal Formulation: With patient  Time For Goal Achievement: 04/05/21 Potential to Achieve Goals: Good  Plan Frequency remains appropriate;Discharge plan needs to be updated       AM-PAC OT "6 Clicks" Daily Activity     Outcome Measure   Help from another person eating meals?: None Help from another person taking care of personal grooming?: None Help from another person toileting, which includes using toliet, bedpan, or urinal?: A Little Help from another person bathing (including washing, rinsing, drying)?: A Little Help from another person to put on and taking off regular upper body clothing?: A Little Help from another person to put on and taking off regular lower body clothing?: A Little 6 Click Score: 20    End of Session Equipment Utilized During Treatment: Oxygen  OT Visit Diagnosis: Unsteadiness on feet (R26.81);Repeated falls (R29.6);Muscle weakness (generalized) (M62.81);History of falling (Z91.81)   Activity Tolerance Patient tolerated treatment  well   Patient Left in bed;with call bell/phone within reach;with bed alarm set   Nurse Communication Mobility status        Time: 3474-2595 OT Time Calculation (min): 23 min  Charges: OT General Charges $OT Visit: 1 Visit OT Treatments $Self Care/Home Management : 23-37 mins  Jackquline Denmark, MS, OTR/L , CBIS ascom [phone]  03/23/21, 3:30 PM

## 2021-03-24 DIAGNOSIS — J9622 Acute and chronic respiratory failure with hypercapnia: Secondary | ICD-10-CM | POA: Diagnosis not present

## 2021-03-24 DIAGNOSIS — J9621 Acute and chronic respiratory failure with hypoxia: Secondary | ICD-10-CM | POA: Diagnosis not present

## 2021-03-24 DIAGNOSIS — J9601 Acute respiratory failure with hypoxia: Secondary | ICD-10-CM | POA: Diagnosis not present

## 2021-03-24 DIAGNOSIS — J9602 Acute respiratory failure with hypercapnia: Secondary | ICD-10-CM | POA: Diagnosis not present

## 2021-03-24 LAB — CBC
HCT: 25.4 % — ABNORMAL LOW (ref 36.0–46.0)
Hemoglobin: 8.2 g/dL — ABNORMAL LOW (ref 12.0–15.0)
MCH: 30.5 pg (ref 26.0–34.0)
MCHC: 32.3 g/dL (ref 30.0–36.0)
MCV: 94.4 fL (ref 80.0–100.0)
Platelets: 341 10*3/uL (ref 150–400)
RBC: 2.69 MIL/uL — ABNORMAL LOW (ref 3.87–5.11)
RDW: 15.9 % — ABNORMAL HIGH (ref 11.5–15.5)
WBC: 9.1 10*3/uL (ref 4.0–10.5)
nRBC: 0.3 % — ABNORMAL HIGH (ref 0.0–0.2)

## 2021-03-24 LAB — COMPREHENSIVE METABOLIC PANEL
ALT: 29 U/L (ref 0–44)
AST: 29 U/L (ref 15–41)
Albumin: 2.9 g/dL — ABNORMAL LOW (ref 3.5–5.0)
Alkaline Phosphatase: 32 U/L — ABNORMAL LOW (ref 38–126)
Anion gap: 7 (ref 5–15)
BUN: 17 mg/dL (ref 8–23)
CO2: 37 mmol/L — ABNORMAL HIGH (ref 22–32)
Calcium: 8.6 mg/dL — ABNORMAL LOW (ref 8.9–10.3)
Chloride: 90 mmol/L — ABNORMAL LOW (ref 98–111)
Creatinine, Ser: 0.68 mg/dL (ref 0.44–1.00)
GFR, Estimated: >60 mL/min (ref >60)
Glucose, Bld: 116 mg/dL — ABNORMAL HIGH (ref 70–99)
Potassium: 3.2 mmol/L — ABNORMAL LOW (ref 3.5–5.1)
Sodium: 134 mmol/L — ABNORMAL LOW (ref 135–145)
Total Bilirubin: 0.5 mg/dL (ref 0.3–1.2)
Total Protein: 5.3 g/dL — ABNORMAL LOW (ref 6.5–8.1)

## 2021-03-24 LAB — COPPER, SERUM: Copper: 134 ug/dL (ref 80–158)

## 2021-03-24 LAB — GLUCOSE, CAPILLARY
Glucose-Capillary: 121 mg/dL — ABNORMAL HIGH (ref 70–99)
Glucose-Capillary: 119 mg/dL — ABNORMAL HIGH (ref 70–99)
Glucose-Capillary: 107 mg/dL — ABNORMAL HIGH (ref 70–99)
Glucose-Capillary: 146 mg/dL — ABNORMAL HIGH (ref 70–99)
Glucose-Capillary: 135 mg/dL — ABNORMAL HIGH (ref 70–99)
Glucose-Capillary: 103 mg/dL — ABNORMAL HIGH (ref 70–99)

## 2021-03-24 LAB — MAGNESIUM: Magnesium: 1.7 mg/dL (ref 1.7–2.4)

## 2021-03-24 LAB — CULTURE, RESPIRATORY W GRAM STAIN

## 2021-03-24 LAB — PHOSPHORUS: Phosphorus: 2.8 mg/dL (ref 2.5–4.6)

## 2021-03-24 MED ORDER — POTASSIUM CHLORIDE 20 MEQ PO PACK
40.0000 meq | PACK | Freq: Once | ORAL | Status: AC
  Administered 2021-03-24: 40 meq
  Filled 2021-03-24: qty 2

## 2021-03-24 MED ORDER — POTASSIUM CHLORIDE 10 MEQ/100ML IV SOLN
10.0000 meq | INTRAVENOUS | Status: AC
  Administered 2021-03-24 (×4): 10 meq via INTRAVENOUS
  Filled 2021-03-24 (×4): qty 100

## 2021-03-24 MED ORDER — HYDROCODONE-ACETAMINOPHEN 5-325 MG PO TABS
1.0000 | ORAL_TABLET | Freq: Four times a day (QID) | ORAL | Status: DC | PRN
  Administered 2021-03-25: 2
  Filled 2021-03-24: qty 2

## 2021-03-24 MED ORDER — SODIUM CHLORIDE 0.9 % IV SOLN
2.0000 g | Freq: Three times a day (TID) | INTRAVENOUS | Status: DC
  Administered 2021-03-24 – 2021-03-28 (×11): 2 g via INTRAVENOUS
  Filled 2021-03-24 (×14): qty 2

## 2021-03-24 MED ORDER — MAGNESIUM SULFATE 2 GM/50ML IV SOLN
2.0000 g | Freq: Once | INTRAVENOUS | Status: AC
  Administered 2021-03-24: 2 g via INTRAVENOUS
  Filled 2021-03-24: qty 50

## 2021-03-24 NOTE — Progress Notes (Signed)
SLP Cancellation Note  Patient Details Name: Virginia [name] MRN: [id number] DOB: [birth year]   Cancelled treatment:       Reason Eval/Treat Not Completed: Medical issues which prohibited therapy;Patient not medically ready (chart reviewed) Per MD's note this morning, "10/1: failure to wean from vent, NG placed for nutrition". ST services will f/u w/ pt's status assessing when pt has achieved wean from vent to TCT for ~4 hours -- then PMV assessment can be initiated as well as BSE in hopes to establish an oral diet again. Recommend frequent oral care care for hygiene and stimulation of swallowing. Aspiration precautions.    Jerilynn Som, MS, CCC-SLP Speech Language Pathologist Rehab Services [phone] [facility] 03/24/2021, 10:57 AM

## 2021-03-24 NOTE — Plan of Care (Signed)
Neuro: alert and oriented x 4 Resp: stable on vent/trach, successful trach collar trial today CV: afebrile, vital signs stable GIGU:Purewick in place, BM X 1, tube feedings well tolerated Skin:some bruising B UE, skin tear R forearm Social: No contact with family today.  Problem: Education: Goal: Knowledge of General Education information will improve Description: Including pain rating scale, medication(s)/side effects and non-pharmacologic comfort measures Outcome: Progressing   Problem: Health Behavior/Discharge Planning: Goal: Ability to manage health-related needs will improve Outcome: Progressing   Problem: Clinical Measurements: Goal: Ability to maintain clinical measurements within normal limits will improve Outcome: Progressing Goal: Will remain free from infection Outcome: Progressing Goal: Diagnostic test results will improve Outcome: Progressing Goal: Respiratory complications will improve Outcome: Progressing Goal: Cardiovascular complication will be avoided Outcome: Progressing   Problem: Activity: Goal: Risk for activity intolerance will decrease Outcome: Progressing   Problem: Nutrition: Goal: Adequate nutrition will be maintained Outcome: Progressing   Problem: Coping: Goal: Level of anxiety will decrease Outcome: Progressing   Problem: Elimination: Goal: Will not experience complications related to bowel motility Outcome: Progressing Goal: Will not experience complications related to urinary retention Outcome: Progressing   Problem: Pain Managment: Goal: General experience of comfort will improve Outcome: Progressing   Problem: Safety: Goal: Ability to remain free from injury will improve Outcome: Progressing   Problem: Skin Integrity: Goal: Risk for impaired skin integrity will decrease Outcome: Progressing

## 2021-03-24 NOTE — Progress Notes (Signed)
PHARMACY CONSULT NOTE - FOLLOW UP  Pharmacy Consult for Electrolyte Monitoring and Replacement   Recent Labs: Potassium (mmol/L)  Date Value  03/24/2021 3.2 (L)   Magnesium (mg/dL)  Date Value  93/81/8299 1.7   Calcium (mg/dL)  Date Value  37/16/9678 8.6 (L)   Albumin (g/dL)  Date Value  93/81/0175 2.9 (L)   Phosphorus (mg/dL)  Date Value  04/17/8526 2.8   Sodium (mmol/L)  Date Value  03/24/2021 134 (L)     Assessment: 70 year old female presented to the ED unresponsive. Patient required mechanical ventilation via chronic trach. Respiratory failure s/t end stage COPD. Patient with dark tarry stools that were hemoccult positive in the ED, concerning for GIB. Pharmacy consult to manage electrolytes.  Started Mg oxide 400 mg daily.  On free water 60 ml q4H.   Goal of Therapy:  Electrolytes WNL  Plan:  Medical team ordered Kcl 10  mEq x 4. Will order Mg 2 g IV x1.  F/u with AM labs.   Ronnald Ramp, PharmD, BCPS Clinical Pharmacist 03/24/2021 8:21 AM

## 2021-03-24 NOTE — Progress Notes (Signed)
NAME:  Virginia [name], MRN:  [id number], DOB:  1950-10-03, LOS: 4 ADMISSION DATE:  03/20/2021, CONSULTATION DATE: 03/20/2021 REFERRING MD: Dr. Cheri Fowler, CHIEF COMPLAINT: Unresponsiveness   History of Present Illness:  This is a 70 yo female with end stage COPD and chronic tracheostomy who presented to [facility] ER via EMS from home on 09/27 due to unresponsiveness.  Per ER notes EMS reported pts initial GCS was 3 and O2 sats were unreadable.  EMS provided bag ventilation via tracheostomy en route to the ER.  Pt also received 2 mg of narcan and initially became responsive.  Pts son informed EMS on the scene pts code status DNR, however there was no DNR paperwork readily available on the scene.  Pt recently hospitalized at [facility] from 08/28~09/19 following treatment of acute on chronic hypoxic hypercapnic respiratory failure secondary to AECOPD and enterobacter aerogenes bronchitis.    Pertinent  Medical History  Atrial Fibrillation on Eliquis C. Difficile Severe COPD Chronic tracheostomy Anxiety & depression Hepatitis C Arthritis HTN Fibromyalgia Anemia CAD Thoracic AAA  Significant Hospital Events: Including procedures, antibiotic start and stop dates in addition to other pertinent events   09/27: Pt admitted to ICU with acute on chronic hypoxic hypercapnic respiratory failure secondary to end stage COPD requiring mechanical ventilation via chronic tracheostomy  09/29: Tolerating PSV, will attempt TCT as tolerated 09/30: Unable to tolerate weaning pressures on PSV (immediately becomes extremely anxious and SOB with hypooxia), add scheduled Valium, plan to place Dobhoff for enteral access 10/1 failure to wean from vent, NG placed for nutrition  Interim History / Subjective:  Severe end stage COPD Remains on vent Failure to wean from vent   Objective   Blood pressure (!) 68/54, pulse 89, temperature 98.3 F (36.8 C), temperature source Oral, resp. rate 15, height 5' (1.524 m), weight 55  kg, SpO2 97 %.    Vent Mode: PRVC FiO2 (%):  [28 %] 28 % Set Rate:  [14 bmp] 14 bmp Vt Set:  [450 mL] 450 mL PEEP:  [5 cmH20] 5 cmH20 Pressure Support:  [14 cmH20] 14 cmH20   Intake/Output Summary (Last 24 hours) at 03/24/2021 0736 Last data filed at 03/23/2021 1804 Gross per 24 hour  Intake 135.55 ml  Output 625 ml  Net -489.45 ml    Filed Weights   03/20/21 2215 03/22/21 0500 03/23/21 0500  Weight: 51.3 kg 55.4 kg 55 kg    REVIEW OF SYSTEMS  PATIENT IS UNABLE TO PROVIDE COMPLETE REVIEW OF SYSTEMS DUE TO SEVERE CRITICAL ILLNESS AND TOXIC METABOLIC ENCEPHALOPATHY  PHYSICAL EXAMINATION:  GENERAL:critically ill appearing, +resp distress EYES: Pupils equal, round, reactive to light.  No scleral icterus.  MOUTH: Moist mucosal membrane. S/p trach NECK: Supple.  PULMONARY: +rhonchi, +wheezing CARDIOVASCULAR: S1 and S2.  No murmurs  GASTROINTESTINAL: Soft, nontender, -distended. Positive bowel sounds.  MUSCULOSKELETAL: No swelling, clubbing, or edema.  NEUROLOGIC: lethargic SKIN:intact,warm,dry     Assessment & Plan:  Acute on chronic hypoxic hypercapnic respiratory failure secondary to AECOPD  Mechanical ventilation via chronic tracheostomy  Hx: Chronic Home O2 _0 .5~5L during the day and trilogy with capped tracheostomy qhs  Severe ACUTE Hypoxic and Hypercapnic Respiratory Failure -continue Mechanical Ventilator support -continue Bronchodilator Therapy -Wean Fio2 and PEEP as tolerated -VAP/VENT bundle implementation -will perform SAT/SBT when respiratory parameters are met  ACUTE elevated troponin likely secondary to demand ischemia  oxygen as needed -Lasix as tolerated -follow up cardiac enzymes as indicated  Elevated liver enzymes likely secondary to hepatitis C hx  -  Trend hepatic function panel   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) - Dietitian consulted to assess nutritional needs appreciate input    Acute  Blood Loss anemia due to GI Bleed superimposed on Anemia of Chronic Disease Patient with dark tarry stools that were Hemoccult positive Hx: Anemia -Monitor for S/Sx of bleeding -Trend CBC -SCD's for VTE Prophylaxis (per GI can resume Eliquis once able to tolerate PO) -Transfuse for Hgb <7 - Pantoprazole 9m IV BID Gi says OK to resume oral AC   GI GI PROPHYLAXIS as indicated  NUTRITIONAL STATUS DIET-->TF's as tolerated Constipation protocol as indicated  ELECTROLYTES -follow labs as needed -replace as needed -pharmacy consultation and following   Best Practice (right click and "Reselect all SmartList Selections" daily)   Diet/type: NPO, start tube feeds once enteral access obtained DVT prophylaxis: SCD's GI prophylaxis: PPI Lines: N/A Foley: N/A Code Status:  full code Last date of multidisciplinary goals of care discussion [03/23/2021]   Labs   CBC: Recent Labs  Lab 03/20/21 1510 03/20/21 2246 03/21/21 0509 03/21/21 0805 03/22/21 0328 03/22/21 0929 03/22/21 1501 03/22/21 2102 03/23/21 1314 03/24/21 0321  WBC 6.5  --  7.3  --  8.1  --   --   --  8.4 9.1  NEUTROABS 5.2  --   --   --  6.5  --   --   --   --   --   HGB 9.6*   < > 8.2*   < > 7.3* 7.8* 8.3* 8.4* 8.7* 8.2*  HCT 33.6*   < > 26.8*   < > 23.9* 24.8* 27.1* 26.8* 27.6* 25.4*  MCV 104.7*  --  100.8*  --  97.2  --   --   --  96.8 94.4  PLT 366  --  333  --  301  --   --   --  318 341   < > = values in this interval not displayed.     Basic Metabolic Panel: Recent Labs  Lab 03/20/21 1510 03/21/21 0509 03/21/21 1518 03/22/21 0328 03/22/21 0929 03/23/21 1337 03/24/21 0321  NA 142 142  --   --  142 136 134*  K 4.2 3.8  --   --  3.7 3.5 3.2*  CL 92* 95*  --   --  95* 90* 90*  CO2 37* 29  --   --  38* 36* 37*  GLUCOSE 133* 135*  --   --  103* 104* 116*  BUN 15 15  --   --  _0 CREATININE 0.86 0.96  --   --  0.55 0.57 0.68  CALCIUM 9.0 8.7*  --   --  8.6* 8.7* 8.6*  MG  --  1.4* 2.0 2.1   --   --  1.7  PHOS  --  1.2* 5.0*  --   --   --  2.8    GFR: Estimated Creatinine Clearance: 50.9 mL/min (by C-G formula based on SCr of 0.68 mg/dL). Recent Labs  Lab 03/20/21 1510 03/20/21 1753 03/21/21 0509 03/21/21 0613 03/21/21 2034 03/22/21 0328 03/23/21 1314 03/24/21 0321  PROCALCITON  --  0.42 0.64  --   --  0.35  --   --   WBC 6.5  --  7.3  --   --  8.1 8.4 9.1  LATICACIDVEN 3.4* 3.2*  --  2.3* 1.1  --   --   --      Liver Function Tests:  Recent Labs  Lab 03/20/21 1510 03/21/21 0805 03/23/21 1337 03/24/21 0321  AST 73* 44* 27 29  ALT 46* 43 30 29  ALKPHOS 37* 32* 29* 32*  BILITOT 0.9 1.4* 0.9 0.5  PROT 6.4* 5.4* 5.9* 5.3*  ALBUMIN 3.3* 3.0* 3.3* 2.9*    No results for input(s): LIPASE, AMYLASE in the last 168 hours. No results for input(s): AMMONIA in the last 168 hours.  ABG    Component Value Date/Time   PHART 7.50 (H) 03/21/2021 0525   PCO2ART 40 03/21/2021 0525   PO2ART 120 (H) 03/21/2021 0525   HCO3 31.2 (H) 03/21/2021 0525   O2SAT 99.0 03/21/2021 0525     Coagulation Profile: Recent Labs  Lab 03/20/21 1510  INR 1.1     Cardiac Enzymes: No results for input(s): CKTOTAL, CKMB, CKMBINDEX, TROPONINI in the last 168 hours.  HbA1C: Hgb A1c MFr Bld  Date/Time Value Ref Range Status  01/08/2021 06:28 AM 4.9 4.8 - 5.6 % Final    Comment:    (NOTE) Pre diabetes:          5.7%-6.4%  Diabetes:              >6.4%  Glycemic control for   <7.0% adults with diabetes   06/06/2020 12:07 PM 5.3 4.8 - 5.6 % Final    Comment:    (NOTE) Pre diabetes:          5.7%-6.4%  Diabetes:              >6.4%  Glycemic control for   <7.0% adults with diabetes      DVT/GI PRX  assessed I Assessed the need for Labs I Assessed the need for Foley I Assessed the need for Central Venous Line Family Discussion when available I Assessed the need for Mobilization I made an Assessment of medications to be adjusted accordingly Safety Risk assessment  completed  CASE DISCUSSED IN MULTIDISCIPLINARY ROUNDS WITH ICU TEAM     Critical Care Time devoted to patient care services described in this note is 45 minutes.  Critical care was necessary to treat /prevent imminent and life-threatening deterioration. Overall, patient is critically ill, prognosis is guarded.    Corrin Parker, M.D.  [facility]  Medical Director [place] Director [facility] Cardio-Pulmonary Department

## 2021-03-25 LAB — CBC
HCT: 25.1 % — ABNORMAL LOW (ref 36.0–46.0)
Hemoglobin: 8.1 g/dL — ABNORMAL LOW (ref 12.0–15.0)
MCH: 30.8 pg (ref 26.0–34.0)
MCHC: 32.3 g/dL (ref 30.0–36.0)
MCV: 95.4 fL (ref 80.0–100.0)
Platelets: 364 10*3/uL (ref 150–400)
RBC: 2.63 MIL/uL — ABNORMAL LOW (ref 3.87–5.11)
RDW: 16.4 % — ABNORMAL HIGH (ref 11.5–15.5)
WBC: 8.6 10*3/uL (ref 4.0–10.5)
nRBC: 0.4 % — ABNORMAL HIGH (ref 0.0–0.2)

## 2021-03-25 LAB — COMPREHENSIVE METABOLIC PANEL
ALT: 26 U/L (ref 0–44)
AST: 23 U/L (ref 15–41)
Albumin: 2.8 g/dL — ABNORMAL LOW (ref 3.5–5.0)
Alkaline Phosphatase: 28 U/L — ABNORMAL LOW (ref 38–126)
Anion gap: 5 (ref 5–15)
BUN: 19 mg/dL (ref 8–23)
CO2: 36 mmol/L — ABNORMAL HIGH (ref 22–32)
Calcium: 8.2 mg/dL — ABNORMAL LOW (ref 8.9–10.3)
Chloride: 94 mmol/L — ABNORMAL LOW (ref 98–111)
Creatinine, Ser: 0.69 mg/dL (ref 0.44–1.00)
GFR, Estimated: >60 mL/min (ref >60)
Glucose, Bld: 108 mg/dL — ABNORMAL HIGH (ref 70–99)
Potassium: 3.8 mmol/L (ref 3.5–5.1)
Sodium: 135 mmol/L (ref 135–145)
Total Bilirubin: 0.8 mg/dL (ref 0.3–1.2)
Total Protein: 5.1 g/dL — ABNORMAL LOW (ref 6.5–8.1)

## 2021-03-25 LAB — PHOSPHORUS: Phosphorus: 3.1 mg/dL (ref 2.5–4.6)

## 2021-03-25 LAB — GLUCOSE, CAPILLARY
Glucose-Capillary: 101 mg/dL — ABNORMAL HIGH (ref 70–99)
Glucose-Capillary: 104 mg/dL — ABNORMAL HIGH (ref 70–99)
Glucose-Capillary: 109 mg/dL — ABNORMAL HIGH (ref 70–99)
Glucose-Capillary: 112 mg/dL — ABNORMAL HIGH (ref 70–99)
Glucose-Capillary: 132 mg/dL — ABNORMAL HIGH (ref 70–99)
Glucose-Capillary: 144 mg/dL — ABNORMAL HIGH (ref 70–99)

## 2021-03-25 LAB — CULTURE, BLOOD (ROUTINE X 2)
Culture: NO GROWTH
Culture: NO GROWTH
Special Requests: ADEQUATE

## 2021-03-25 LAB — MAGNESIUM: Magnesium: 2.3 mg/dL (ref 1.7–2.4)

## 2021-03-25 NOTE — Progress Notes (Signed)
Patient noted to have notable amount of secretions. Suctioned trach and changed inner cannula prior to placing on vent. Vent initiated on previous settings. Patient continue to c/o sob. SVN given in addition to airway suction. Saturation noted at 97% return volumes adequate on vent. RN to give meds for anxiety

## 2021-03-25 NOTE — Progress Notes (Signed)
NAME:  Virginia [name], MRN:  [id number], DOB:  Jan 06, 1951, LOS: 5 ADMISSION DATE:  03/20/2021, CONSULTATION DATE: 03/20/2021 REFERRING MD: Dr. Vicente Males, CHIEF COMPLAINT: Unresponsiveness   History of Present Illness:  This is a 70 yo female with end stage COPD and chronic tracheostomy who presented to [facility] ER via EMS from home on 09/27 due to unresponsiveness.  Per ER notes EMS reported pts initial GCS was 3 and O2 sats were unreadable.  EMS provided bag ventilation via tracheostomy en route to the ER.  Pt also received 2 mg of narcan and initially became responsive.  Pts son informed EMS on the scene pts code status DNR, however there was no DNR paperwork readily available on the scene.  Pt recently hospitalized at [facility] from 08/28~09/19 following treatment of acute on chronic hypoxic hypercapnic respiratory failure secondary to AECOPD and enterobacter aerogenes bronchitis.    ED course Upon arrival to the ER pt unresponsive to verbal/painful stimulation with O2 sats initially in the mid 90's on RA.  However, pt immediately became hypoxic with O2 sats decreasing to 60% with shallow respirations on RA, requiring bag ventilation via tracheostomy.  Due to continued hypoxia pts #6 cuffless tracheostomy was changed to a #6 cuffed trach, and pt placed on mechanical ventilation via trach with O2 sats increasing to 92%.  Upon removal of cuffless trach RT noted pts inner canula was partially occluded by dried secretions.  VBG revealed pH 7.15/pCO2 118/acid-base excess 9.2/bicarb 41.1.  CXR concerning for bilateral trace pleural effusions and emphysema.  Code sepsis activated by ER physician pt received 1L LR bolus, ceftriaxone, azithromycin, and vancomycin due to concern of pneumonia.  PCCM team contacted for ICU admission.   Pertinent  Medical History  Atrial Fibrillation on Eliquis C. Difficile Severe COPD Chronic tracheostomy Anxiety & depression Hepatitis  C Arthritis HTN Fibromyalgia Anemia CAD Thoracic AAA  Significant Hospital Events: Including procedures, antibiotic start and stop dates in addition to other pertinent events   09/27: Pt admitted to ICU with acute on chronic hypoxic hypercapnic respiratory failure secondary to end stage COPD requiring mechanical ventilation via chronic tracheostomy  09/29: Tolerating PSV, will attempt TCT as tolerated 09/30: Unable to tolerate weaning pressures on PSV (immediately becomes extremely anxious and SOB with hypooxia), add scheduled Valium, plan to place Dobhoff for enteral access 10/2: Tolerating trach collars trials at 35%.   Interim History / Subjective:  -No acute events reported overnight On  35% Trach collar trials. Using PMV this am, cuff deflated -Afebrile, hemodynamically stable, UOP adequate -No reports of GI Bleeding -Tolerating TF  Objective   Blood pressure (!) 88/38, pulse (!) 145, temperature 98.7 F (37.1 C), temperature source Oral, resp. rate (!) 23, height 5' (1.524 m), weight 55 kg, SpO2 95 %.    Vent Mode: PRVC FiO2 (%):  [28 %-35 %] 35 % Set Rate:  [14 bmp] 14 bmp Vt Set:  [450 mL] 450 mL PEEP:  [5 cmH20] 5 cmH20 Plateau Pressure:  [20 cmH20-23 cmH20] 22 cmH20   Intake/Output Summary (Last 24 hours) at 03/25/2021 1013 Last data filed at 03/25/2021 0800 Gross per 24 hour  Intake 649 ml  Output 550 ml  Net 99 ml    Filed Weights   03/20/21 2215 03/22/21 0500 03/23/21 0500  Weight: 51.3 kg 55.4 kg 55 kg   Examination: General: chronically ill appearing frail female, NAD, mechanically ventilated via chronic tracheostomy HENT: #6 [place]  midline chronic tracheostomy, no JVD  Lungs: mechanical breath sounds throughout,  no wheezing or rales noted, synchronous with vent, even, non labored  Cardiovascular: Tachycardia, regular rhythm, no R/G, 2+ radial/1+ distal pulses trace bilateral lower extremity edema  Abdomen: +BS x4, soft, non tender, non distended   Extremities: normal tone, no edema  Neuro: Sleeping (recently received Valium for anxiety), arouses easily to voice, following commands, no focal deficits, mouthing words and needs, pupils PERRL GU: External catheter in place  Resolved Hospital Problem list     Assessment & Plan:  Acute on chronic hypoxic hypercapnic respiratory failure secondary to AECOPD and mucous plugging & questionable underlying pneumonia Mechanical ventilation via chronic tracheostomy  Hx: Chronic Home O2 @4 .5~5L during the day and trilogy with capped tracheostomy qhs -Trach collar trials during the day with vent as needed at night -Plateau pressures less than 30 cm H20 -Wean FiO2 & PEEP as tolerated to maintain O2 sats >88% -Follow intermittent Chest X-ray & ABG as needed -Unable to tolerate PSV this morning ~ will attempt SBT's and trach collar trials as tolerated -May require ventilator support qhs -Implement VAP Bundle -Scheduled & Prn Bronchodilators -IV and Nebulized steroids - Ensure adequate pulmonary hygiene   Mildly elevated troponin likely secondary to demand ischemia  Chronic atrial fibrillation on Eliquis SVT  Hx: HTN and CAD  -Continuous telemetry monitoring  -Hold Eliquis in the setting of GI bleed -Continue Metoprolol, Cardizem, and Lisinopril as BP and renal functions permits -TSH low, but Free T4 normal -Prn metoprolol for hr >115   Lactic acidosis>>resolved -Trend BMP and lactic acid   Elevated liver enzymes likely secondary to hepatitis C hx  -Trend hepatic function panel   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) - Dietitian consulted to assess nutritional needs appreciate input   Acute Blood Loss anemia due to GI Bleed superimposed on Anemia of Chronic Disease Patient with dark tarry stools that were Hemoccult positive Hx: Anemia -Monitor for S/Sx of bleeding -Trend CBC -SCD's for VTE Prophylaxis (per GI can resume Eliquis once  able to tolerate PO) -Transfuse for Hgb <7 -Pantoprazole 40mg  IV BID -Hold NSAIDs, Anticoagulation, ASA -GI evaluated ~ patient declined EGD, recommends colonoscopy either inpatient or outpatient if pt is agreeable, ok to restart Eliquis and start diet once enteral access obtained  Acute encephalopathy secondary to hypercapnia >>resolved Anxiety/Panic Attacks with weaning Hx: Anxiety and depression  -Maintain RASS goal 0 to -1 -Will add scheduled Valium for now -Once able to pass swallow evaluation/enteral access obtained, can resume home amitriptyline, diazepam, gabapentin, and seroquel  -Urine drug screen positive for Benzodiazepines -CT head negative for acute intracranial abnormality  Best Practice (right click and "Reselect all SmartList Selections" daily)   Diet/type: NPO, start tube feeds once enteral access obtained DVT prophylaxis: SCD's GI prophylaxis: PPI Lines: N/A Foley: N/A Code Status:  full code Last date of multidisciplinary goals of care discussion [03/23/2021]   Labs   CBC: Recent Labs  Lab 03/20/21 1510 03/20/21 2246 03/21/21 0509 03/21/21 0805 03/22/21 0328 03/22/21 0929 03/22/21 1501 03/22/21 2102 03/23/21 1314 03/24/21 0321 03/25/21 0547  WBC 6.5  --  7.3  --  8.1  --   --   --  8.4 9.1 8.6  NEUTROABS 5.2  --   --   --  6.5  --   --   --   --   --   --   HGB 9.6*   < > 8.2*   < > 7.3*   < > 8.3* 8.4* 8.7* 8.2* 8.1*  HCT 33.6*   < > 26.8*   < > 23.9*   < > 27.1* 26.8* 27.6* 25.4* 25.1*  MCV 104.7*  --  100.8*  --  97.2  --   --   --  96.8 94.4 95.4  PLT 366  --  333  --  301  --   --   --  318 341 364   < > = values in this interval not displayed.     Basic Metabolic Panel: Recent Labs  Lab 03/21/21 0509 03/21/21 1518 03/22/21 0328 03/22/21 0929 03/23/21 1337 03/24/21 0321 03/25/21 0547  NA 142  --   --  142 136 134* 135  K 3.8  --   --  3.7 3.5 3.2* 3.8  CL 95*  --   --  95* 90* 90* 94*  CO2 29  --   --  38* 36* 37* 36*  GLUCOSE  135*  --   --  103* 104* 116* 108*  BUN 15  --   --  13 11 17 19   CREATININE 0.96  --   --  0.55 0.57 0.68 0.69  CALCIUM 8.7*  --   --  8.6* 8.7* 8.6* 8.2*  MG 1.4* 2.0 2.1  --   --  1.7 2.3  PHOS 1.2* 5.0*  --   --   --  2.8 3.1    GFR: Estimated Creatinine Clearance: 50.9 mL/min (by C-G formula based on SCr of 0.69 mg/dL). Recent Labs  Lab 03/20/21 1510 03/20/21 1753 03/21/21 0509 03/21/21 0613 03/21/21 2034 03/22/21 0328 03/23/21 1314 03/24/21 0321 03/25/21 0547  PROCALCITON  --  0.42 0.64  --   --  0.35  --   --   --   WBC 6.5  --  7.3  --   --  8.1 8.4 9.1 8.6  LATICACIDVEN 3.4* 3.2*  --  2.3* 1.1  --   --   --   --      Liver Function Tests: Recent Labs  Lab 03/20/21 1510 03/21/21 0805 03/23/21 1337 03/24/21 0321 03/25/21 0547  AST 73* 44* 27 29 23   ALT 46* 43 30 29 26   ALKPHOS 37* 32* 29* 32* 28*  BILITOT 0.9 1.4* 0.9 0.5 0.8  PROT 6.4* 5.4* 5.9* 5.3* 5.1*  ALBUMIN 3.3* 3.0* 3.3* 2.9* 2.8*    No results for input(s): LIPASE, AMYLASE in the last 168 hours. No results for input(s): AMMONIA in the last 168 hours.  ABG    Component Value Date/Time   PHART 7.50 (H) 03/21/2021 0525   PCO2ART 40 03/21/2021 0525   PO2ART 120 (H) 03/21/2021 0525   HCO3 31.2 (H) 03/21/2021 0525   O2SAT 99.0 03/21/2021 0525     Coagulation Profile: Recent Labs  Lab 03/20/21 1510  INR 1.1     Cardiac Enzymes: No results for input(s): CKTOTAL, CKMB, CKMBINDEX, TROPONINI in the last 168 hours.  HbA1C: Hgb A1c MFr Bld  Date/Time Value Ref Range Status  01/08/2021 06:28 AM 4.9 4.8 - 5.6 % Final    Comment:    (NOTE) Pre diabetes:          5.7%-6.4%  Diabetes:              >6.4%  Glycemic control for   <7.0% adults with diabetes   06/06/2020 12:07 PM 5.3 4.8 - 5.6 % Final    Comment:    (NOTE) Pre diabetes:          5.7%-6.4%  Diabetes:              >  6.4%  Glycemic control for   <7.0% adults with diabetes     CBG: Recent Labs  Lab 03/24/21 1627  03/24/21 1931 03/24/21 2312 03/25/21 0310 03/25/21 0733  GLUCAP 146* 135* 103* 109* 104*    Past Medical History:  She,  has a past medical history of Anemia, Anxiety, Arthritis, Atrial fibrillation (HCC), C. difficile colitis (09/2015), COPD (chronic obstructive pulmonary disease) (HCC), Depression, Dyspnea, Dysrhythmia, Fibromyalgia, H/O tracheostomy, Hep C w/ coma, chronic, Hypertension, MRSA pneumonia (HCC) (2017), On home oxygen therapy, Osteoporosis, Peripheral neuropathy, RLS (restless legs syndrome), S/P percutaneous endoscopic gastrostomy (PEG) tube placement (HCC) (09/2015), and Ventilator associated pneumonia (HCC) (10/2015).   Surgical History:   Past Surgical History:  Procedure Laterality Date   ABDOMINAL HYSTERECTOMY     BREAST SURGERY Bilateral    Breast Implants   DILATION AND CURETTAGE OF UTERUS     PEG PLACEMENT N/A 06/21/2020   Procedure: PERCUTANEOUS ENDOSCOPIC GASTROSTOMY (PEG) PLACEMENT;  Surgeon: Regis Bill, MD;  Location: [facility] ENDOSCOPY;  Service: Endoscopy;  Laterality: N/A;   REVERSE SHOULDER ARTHROPLASTY Right 03/06/2017   Procedure: REVERSE SHOULDER ARTHROPLASTY;  Surgeon: Christena Flake, MD;  Location: [facility] ORS;  Service: Orthopedics;  Laterality: Right;   ROTATOR CUFF REPAIR Bilateral    TRACHEOSTOMY TUBE PLACEMENT N/A 06/15/2020   Procedure: TRACHEOSTOMY;  Surgeon: Geanie Logan, MD;  Location: [facility] ORS;  Service: ENT;  Laterality: N/A;     Social History:   reports that she quit smoking about 5 years ago. Her smoking use included cigarettes. She smoked an average of 1.5 packs per day. She has never used smokeless tobacco. She reports that she does not drink alcohol and does not use drugs.   Family History:  Her family history includes Hypertension in her mother.   Allergies No Known Allergies   Home Medications  Prior to Admission medications   Medication Sig Start Date End Date Taking? Authorizing Provider  albuterol (VENTOLIN HFA) 108  (90 Base) MCG/ACT inhaler Inhale 2 puffs into the lungs every 4 (four) hours as needed for wheezing or shortness of breath.    [provider]  amitriptyline (ELAVIL) 25 MG tablet Take 25 mg by mouth at bedtime.  01/14/19   [provider]  apixaban (ELIQUIS) 5 MG TABS tablet Take 1 tablet (5 mg total) by mouth 2 (two) times daily. 11/10/16   Adrian Saran, MD  budesonide (PULMICORT) 0.5 MG/2ML nebulizer solution Take 0.5 mg by nebulization daily. 11/28/20   [provider]  diazepam (VALIUM) 5 MG tablet Take 1-1.5 tablets (5-7.5 mg total) by mouth every 8 (eight) hours as needed for anxiety. 03/12/21   Osvaldo Shipper, MD  diltiazem (CARDIZEM CD) 120 MG 24 hr capsule Take 120 mg by mouth daily. 04/29/19   [provider]  estradiol (ESTRACE) 1 MG tablet Take 1 mg by mouth daily. 09/10/16   [provider]  gabapentin (NEURONTIN) 300 MG capsule Take 300 mg by mouth 3 (three) times daily. 05/06/20   [provider]  ipratropium-albuterol (DUONEB) 0.5-2.5 (3) MG/3ML SOLN Take 3 mLs by nebulization every 6 (six) hours. 02/23/21   Salena Saner, MD  iron polysaccharides (NIFEREX) 150 MG capsule Take 1 capsule (150 mg total) by mouth daily. 01/12/21 04/12/21  Gillis Santa, MD  MAGNESIUM-OXIDE 400 (241.3 Mg) MG tablet Take 400 mg by mouth daily.  08/24/16   [provider]  methocarbamol (ROBAXIN) 500 MG tablet Take 500 mg by mouth 2 (two) times daily. 12/27/20   [provider]  metoprolol tartrate (LOPRESSOR) 25 MG tablet Take 1 tablet (25 mg total) by mouth 2 (two) times daily. 03/12/21 06/10/21  Osvaldo Shipper, MD  omeprazole (PRILOSEC) 40 MG capsule Take 40 mg by mouth daily. 04/16/19   [provider]  predniSONE (DELTASONE) 5 MG tablet Take 5 mg by mouth daily. 12/25/20   [provider]  QUEtiapine (SEROQUEL) 25 MG tablet Take 25 mg by mouth at bedtime.  10/01/16   [provider]  rosuvastatin (CRESTOR) 10 MG  tablet Take 10 mg by mouth at bedtime. 10/23/20   [provider]  vitamin B-12 100 MCG tablet Take 1 tablet (100 mcg total) by mouth daily. 01/12/21 04/12/21  Gillis Santa, MD  Vitamin D, Ergocalciferol, (DRISDOL) 1.25 MG (50000 UNIT) CAPS capsule Take 1 capsule (50,000 Units total) by mouth every 7 (seven) days. 01/17/21 04/17/21  Gillis Santa, MD    Scheduled Meds:  apixaban  5 mg Per Tube BID   budesonide (PULMICORT) nebulizer solution  0.25 mg Nebulization BID   chlorhexidine  15 mL Mouth Rinse BID   Chlorhexidine Gluconate Cloth  6 each Topical Q0600   diazepam  5 mg Per Tube Q6H   docusate  100 mg Per Tube BID   free water  60 mL Per Tube Q4H   gabapentin  300 mg Per Tube TID   ipratropium-albuterol  3 mL Nebulization Q6H   iron polysaccharides  150 mg Per Tube Daily   magnesium oxide  400 mg Per Tube Daily   mouth rinse  15 mL Mouth Rinse q12n4p   methocarbamol  500 mg Per Tube BID   methylPREDNISolone (SOLU-MEDROL) injection  20 mg Intravenous Q24H   metoprolol tartrate  25 mg Per Tube BID   pantoprazole (PROTONIX) IV  40 mg Intravenous Q12H   polyethylene glycol  17 g Per Tube Daily   QUEtiapine  25 mg Per Tube QHS   rosuvastatin  10 mg Per Tube QHS   cyanocobalamin  100 mcg Per Tube Daily   Continuous Infusions:  sodium chloride     cefTAZidime (FORTAZ)  IV Stopped (03/24/21 2159)   feeding supplement (VITAL AF 1.2 CAL) 1,000 mL (03/24/21 1414)   PRN Meds:.diazepam, docusate, HYDROcodone-acetaminophen, ipratropium-albuterol, metoprolol tartrate, morphine injection, white petrolatum  Critical care time: 35 minutes       Webb Silversmith, DNP, CCRN, FNP-C, AGACNP-BC Acute Care Nurse Practitioner  [facility] Pager: [phone] [place] [facility]

## 2021-03-25 NOTE — TOC Progression Note (Signed)
Transition of Care [facility]) - Progression Note    Patient Details  Name: Virginia [name] MRN: [id number] Date of Birth: [birth year]  Transition of Care [facility] CM/SW Contact  Marina Goodell Phone Number: [phone] 03/25/2021, 12:51 PM  Clinical Narrative:     Patient failed to wean from vent on 03/24/2021, NG placed for nutrition.  Patient tolerating trach collar trials at 35%. Patient has SNF placement offer at [facility].  Main contact daughter in law [name] [phone] and son Adonna Horsley [phone]. Patient remains FULL CODE       Expected Discharge Plan and Services                                                 Social Determinants of Health (SDOH) Interventions    Readmission Risk Interventions No flowsheet data found.

## 2021-03-25 NOTE — Progress Notes (Signed)
PHARMACY CONSULT NOTE - FOLLOW UP  Pharmacy Consult for Electrolyte Monitoring and Replacement   Recent Labs: Potassium (mmol/L)  Date Value  03/25/2021 3.8   Magnesium (mg/dL)  Date Value  71/24/5809 2.3   Calcium (mg/dL)  Date Value  98/33/8250 8.2 (L)   Albumin (g/dL)  Date Value  53/97/6734 2.8 (L)   Phosphorus (mg/dL)  Date Value  19/37/9024 3.1   Sodium (mmol/L)  Date Value  03/25/2021 135     Assessment: 70 year old female presented to the ED unresponsive. Patient required mechanical ventilation via chronic trach. Respiratory failure s/t end stage COPD. Patient with dark tarry stools that were hemoccult positive in the ED, concerning for GIB. Pharmacy consult to manage electrolytes.  Started Mg oxide 400 mg daily.  On free water 60 ml q4H.   Goal of Therapy:  Electrolytes WNL  Plan:  No replacement at this time.  F/u with AM labs.   Ronnald Ramp, PharmD, BCPS Clinical Pharmacist 03/25/2021 7:59 AM

## 2021-03-26 DIAGNOSIS — F411 Generalized anxiety disorder: Secondary | ICD-10-CM

## 2021-03-26 LAB — CBC
HCT: 26.2 % — ABNORMAL LOW (ref 36.0–46.0)
Hemoglobin: 8.2 g/dL — ABNORMAL LOW (ref 12.0–15.0)
MCH: 30.3 pg (ref 26.0–34.0)
MCHC: 31.3 g/dL (ref 30.0–36.0)
MCV: 96.7 fL (ref 80.0–100.0)
Platelets: 388 10*3/uL (ref 150–400)
RBC: 2.71 MIL/uL — ABNORMAL LOW (ref 3.87–5.11)
RDW: 16.6 % — ABNORMAL HIGH (ref 11.5–15.5)
WBC: 10.6 10*3/uL — ABNORMAL HIGH (ref 4.0–10.5)
nRBC: 0.2 % (ref 0.0–0.2)

## 2021-03-26 LAB — MAGNESIUM: Magnesium: 2 mg/dL (ref 1.7–2.4)

## 2021-03-26 LAB — GLUCOSE, CAPILLARY
Glucose-Capillary: 94 mg/dL (ref 70–99)
Glucose-Capillary: 105 mg/dL — ABNORMAL HIGH (ref 70–99)
Glucose-Capillary: 119 mg/dL — ABNORMAL HIGH (ref 70–99)
Glucose-Capillary: 158 mg/dL — ABNORMAL HIGH (ref 70–99)
Glucose-Capillary: 116 mg/dL — ABNORMAL HIGH (ref 70–99)
Glucose-Capillary: 96 mg/dL (ref 70–99)

## 2021-03-26 LAB — PHOSPHORUS: Phosphorus: 3.4 mg/dL (ref 2.5–4.6)

## 2021-03-26 LAB — BASIC METABOLIC PANEL
Anion gap: 6 (ref 5–15)
BUN: 20 mg/dL (ref 8–23)
CO2: 35 mmol/L — ABNORMAL HIGH (ref 22–32)
Calcium: 8.3 mg/dL — ABNORMAL LOW (ref 8.9–10.3)
Chloride: 96 mmol/L — ABNORMAL LOW (ref 98–111)
Creatinine, Ser: 0.66 mg/dL (ref 0.44–1.00)
GFR, Estimated: >60 mL/min (ref >60)
Glucose, Bld: 102 mg/dL — ABNORMAL HIGH (ref 70–99)
Potassium: 3.6 mmol/L (ref 3.5–5.1)
Sodium: 137 mmol/L (ref 135–145)

## 2021-03-26 MED ORDER — DIAZEPAM 5 MG PO TABS: 2.5000 mg | ORAL_TABLET | Freq: Four times a day (QID) | ORAL | Status: DC | PRN

## 2021-03-26 MED ORDER — MORPHINE SULFATE (PF) 2 MG/ML IV SOLN
2.0000 mg | INTRAVENOUS | Status: AC
  Administered 2021-03-26: 2 mg via INTRAVENOUS
  Filled 2021-03-26: qty 1

## 2021-03-26 MED ORDER — DIAZEPAM 5 MG PO TABS
5.0000 mg | ORAL_TABLET | Freq: Four times a day (QID) | ORAL | Status: DC | PRN
  Administered 2021-03-26 (×2): 5 mg
  Filled 2021-03-26 (×2): qty 1

## 2021-03-26 MED ORDER — LIDOCAINE 5 % EX PTCH
1.0000 | MEDICATED_PATCH | CUTANEOUS | Status: DC
  Administered 2021-03-26 – 2021-04-02 (×8): 1 via TRANSDERMAL
  Filled 2021-03-26 (×8): qty 1

## 2021-03-26 MED ORDER — HYDROXYZINE HCL 25 MG PO TABS
25.0000 mg | ORAL_TABLET | Freq: Three times a day (TID) | ORAL | Status: DC | PRN
  Administered 2021-03-26 – 2021-04-02 (×12): 25 mg
  Filled 2021-03-26 (×14): qty 1

## 2021-03-26 MED ORDER — AMITRIPTYLINE HCL 25 MG PO TABS
25.0000 mg | ORAL_TABLET | Freq: Every day | ORAL | Status: DC
  Administered 2021-03-26 – 2021-04-01 (×7): 25 mg
  Filled 2021-03-26 (×8): qty 1

## 2021-03-26 MED ORDER — DIAZEPAM 5 MG PO TABS
7.5000 mg | ORAL_TABLET | Freq: Three times a day (TID) | ORAL | Status: DC
  Administered 2021-03-26 – 2021-03-29 (×8): 7.5 mg
  Filled 2021-03-26 (×8): qty 2

## 2021-03-26 NOTE — Progress Notes (Addendum)
NAME:  Virginia [name], MRN:  [id number], DOB:  January 02, 1951, LOS: 6 ADMISSION DATE:  03/20/2021, CONSULTATION DATE: 03/20/2021 REFERRING MD: Dr. Vicente Males, CHIEF COMPLAINT: Unresponsiveness   History of Present Illness:  This is a 70 yo female with end stage COPD and chronic tracheostomy who presented to [facility] ER via EMS from home on 09/27 due to unresponsiveness.  Per ER notes EMS reported pts initial GCS was 3 and O2 sats were unreadable.  EMS provided bag ventilation via tracheostomy en route to the ER.  Pt also received 2 mg of narcan and initially became responsive.  Pts son informed EMS on the scene pts code status DNR, however there was no DNR paperwork readily available on the scene.  Pt recently hospitalized at [facility] from 08/28~09/19 following treatment of acute on chronic hypoxic hypercapnic respiratory failure secondary to AECOPD and enterobacter aerogenes bronchitis.    ED course Upon arrival to the ER pt unresponsive to verbal/painful stimulation with O2 sats initially in the mid 90's on RA.  However, pt immediately became hypoxic with O2 sats decreasing to 60% with shallow respirations on RA, requiring bag ventilation via tracheostomy.  Due to continued hypoxia pts #6 cuffless tracheostomy was changed to a #6 cuffed trach, and pt placed on mechanical ventilation via trach with O2 sats increasing to 92%.  Upon removal of cuffless trach RT noted pts inner canula was partially occluded by dried secretions.  VBG revealed pH 7.15/pCO2 118/acid-base excess 9.2/bicarb 41.1.  CXR concerning for bilateral trace pleural effusions and emphysema.  Code sepsis activated by ER physician pt received 1L LR bolus, ceftriaxone, azithromycin, and vancomycin due to concern of pneumonia.  PCCM team contacted for ICU admission.   Pertinent  Medical History  Atrial Fibrillation on Eliquis C. Difficile Severe COPD Chronic tracheostomy Anxiety & depression Hepatitis  C Arthritis HTN Fibromyalgia Anemia CAD Thoracic AAA  Significant Hospital Events: Including procedures, antibiotic start and stop dates in addition to other pertinent events   09/27: Pt admitted to ICU with acute on chronic hypoxic hypercapnic respiratory failure secondary to end stage COPD requiring mechanical ventilation via chronic tracheostomy  09/29: Tolerating PSV, will attempt TCT as tolerated 09/30: Unable to tolerate weaning pressures on PSV (immediately becomes extremely anxious and SOB with hypooxia), add scheduled Valium, plan to place Dobhoff for enteral access 10/2: Tolerating trach collars trials at 35%.  03/26/21- patient complains of inspissated phlegm with dry plugs unable to expectorate. We discussed potential bronch to unplug her and she is wanting to do this asap. She has +resp culture and we are treating with ceftazidime per sensetivity panel for acinitobacter  Interim History / Subjective:  -No acute events reported overnight On  35% Trach collar trials. Using PMV this am, cuff deflated -Afebrile, hemodynamically stable, UOP adequate -No reports of GI Bleeding -Tolerating TF  Objective   Blood pressure (!) 138/91, pulse 84, temperature 98.6 F (37 C), temperature source Oral, resp. rate 20, height 5' (1.524 m), weight 52.8 kg, SpO2 97 %.    Vent Mode: PRVC FiO2 (%):  [28 %-35 %] 28 % Set Rate:  [14 bmp] 14 bmp Vt Set:  [450 mL] 450 mL PEEP:  [5 cmH20] 5 cmH20 Plateau Pressure:  [14 cmH20] 14 cmH20   Intake/Output Summary (Last 24 hours) at 03/26/2021 1254 Last data filed at 03/26/2021 0800 Gross per 24 hour  Intake 3087 ml  Output 1200 ml  Net 1887 ml    Filed Weights   03/22/21 0500 03/23/21 0500 03/26/21  0422  Weight: 55.4 kg 55 kg 52.8 kg   Examination: General: chronically ill appearing frail female, NAD, mechanically ventilated via chronic tracheostomy HENT: #6 Shiley  midline chronic tracheostomy, no JVD  Lungs: mechanical breath sounds  throughout, no wheezing or rales noted, synchronous with vent, even, non labored  Cardiovascular: Tachycardia, regular rhythm, no R/G, 2+ radial/1+ distal pulses trace bilateral lower extremity edema  Abdomen: +BS x4, soft, non tender, non distended  Extremities: normal tone, no edema  Neuro: Sleeping (recently received Valium for anxiety), arouses easily to voice, following commands, no focal deficits, mouthing words and needs, pupils PERRL GU: External catheter in place  Resolved Hospital Problem list     Assessment & Plan:  Acute on chronic hypoxic hypercapnic respiratory failure secondary to AECOPD and mucous plugging & questionable underlying pneumonia Mechanical ventilation via chronic tracheostomy -s/p weaning vent to qhs Hx: Chronic Home O2 .5~5L during the day and trilogy with capped tracheostomy qhs -Trach collar trials during the day with vent QHS Patient requires 24h supplemental O2 at 5L/min and QHS ventilator support for 12h every day.   Mildly elevated troponin likely secondary to demand ischemia  Chronic atrial fibrillation on Eliquis SVT  Hx: HTN and CAD  -Continuous telemetry monitoring  -Hold Eliquis in the setting of GI bleed -Continue Metoprolol, Cardizem, and Lisinopril as BP and renal functions permits -TSH low, but Free T4 normal -Prn metoprolol for hr >115   Mucus plugging with severe tracheitis  Tracheal aspirate + Culture FEW ENTEROBACTER AEROGENES  RARE ACINETOBACTER CALCOACETICUS/BAUMANNII COMPLEX   Report Status 03/24/2021 FINAL   Organism ID, Bacteria ENTEROBACTER AEROGENES   Organism ID, Bacteria ACINETOBACTER CALCOACETICUS/BAUMANNII COMPLEX   Resulting Agency [facility]     Susceptibility   Enterobacter aerogenes Acinetobacter calcoaceticus/baumannii complex    MIC MIC    AMPICILLIN/SULBACTAM   <=2 SENSITIVE  Sensitive    CEFAZOLIN >=64 RESIST... Resistant      CEFEPIME <=0.12 SENS... Sensitive      CEFTAZIDIME 8 SENSITIVE  Sensitive 4  SENSITIVE  Sensitive    CEFTRIAXONE 32 RESISTANT  Resistant      CIPROFLOXACIN <=0.25 SENS... Sensitive <=0.25 SENS... Sensitive    GENTAMICIN <=1 SENSITIVE  Sensitive <=1 SENSITIVE  Sensitive    IMIPENEM 0.5 SENSITIVE  Sensitive <=0.25 SENS... Sensitive    PIP/TAZO 32 INTERMED... Intermediate <=4 SENSITIVE  Sensitive    TRIMETH/SULFA <=20 SENSIT... Sensitive             Lactic acidosis>>resolved -Trend BMP and lactic acid   Elevated liver enzymes likely secondary to hepatitis C hx  -Trend hepatic function panel   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) - Dietitian consulted to assess nutritional needs appreciate input   Acute Blood Loss anemia due to GI Bleed superimposed on Anemia of Chronic Disease Patient with dark tarry stools that were Hemoccult positive Hx: Anemia -Monitor for S/Sx of bleeding -Trend CBC -SCD's for VTE Prophylaxis (per GI can resume Eliquis once able to tolerate PO) -Transfuse for Hgb <7 -Pantoprazole  IV BID -Hold NSAIDs, Anticoagulation, ASA -GI evaluated ~ patient declined EGD, recommends colonoscopy either inpatient or outpatient if pt is agreeable, ok to restart Eliquis and start diet once enteral access obtained  Acute encephalopathy secondary to hypercapnia >>resolved Anxiety/Panic Attacks with weaning Hx: Anxiety and depression  -Maintain RASS goal 0 to -1 -Will add scheduled Valium for now -Once able to pass swallow evaluation/enteral access obtained, can resume home amitriptyline, diazepam, gabapentin, and seroquel  -  Urine drug screen positive for Benzodiazepines -CT head negative for acute intracranial abnormality  Best Practice (right click and "Reselect all SmartList Selections" daily)   Diet/type: NPO, start tube feeds once enteral access obtained DVT prophylaxis: SCD's GI prophylaxis: PPI Lines: N/A Foley: N/A Code Status:  full code Last date of multidisciplinary goals of care  discussion [03/23/2021]   Labs   CBC: Recent Labs  Lab 03/20/21 1510 03/20/21 2246 03/22/21 0328 03/22/21 0929 03/22/21 2102 03/23/21 1314 03/24/21 0321 03/25/21 0547 03/26/21 0523  WBC 6.5   < > 8.1  --   --  8.4 9.1 8.6 10.6*  NEUTROABS 5.2  --  6.5  --   --   --   --   --   --   HGB 9.6*   < > 7.3*   < > 8.4* 8.7* 8.2* 8.1* 8.2*  HCT 33.6*   < > 23.9*   < > 26.8* 27.6* 25.4* 25.1* 26.2*  MCV 104.7*   < > 97.2  --   --  96.8 94.4 95.4 96.7  PLT 366   < > 301  --   --  318 341 364 388   < > = values in this interval not displayed.     Basic Metabolic Panel: Recent Labs  Lab 03/21/21 0509 03/21/21 1518 03/22/21 0328 03/22/21 0929 03/23/21 1337 03/24/21 0321 03/25/21 0547 03/26/21 0523  NA 142  --   --  142 136 134* 135 137  K 3.8  --   --  3.7 3.5 3.2* 3.8 3.6  CL 95*  --   --  95* 90* 90* 94* 96*  CO2 29  --   --  38* 36* 37* 36* 35*  GLUCOSE 135*  --   --  103* 104* 116* 108* 102*  BUN 15  --   --  13 11 17 19 20   CREATININE 0.96  --   --  0.55 0.57 0.68 0.69 0.66  CALCIUM 8.7*  --   --  8.6* 8.7* 8.6* 8.2* 8.3*  MG 1.4* 2.0 2.1  --   --  1.7 2.3 2.0  PHOS 1.2* 5.0*  --   --   --  2.8 3.1 3.4    GFR: Estimated Creatinine Clearance: 47 mL/min (by C-G formula based on SCr of 0.66 mg/dL). Recent Labs  Lab 03/20/21 1510 03/20/21 1753 03/21/21 0509 03/21/21 03/23/21 03/21/21 2034 03/22/21 0328 03/23/21 1314 03/24/21 0321 03/25/21 0547 03/26/21 0523  PROCALCITON  --  0.42 0.64  --   --  0.35  --   --   --   --   WBC 6.5  --  7.3  --   --  8.1 8.4 9.1 8.6 10.6*  LATICACIDVEN 3.4* 3.2*  --  2.3* 1.1  --   --   --   --   --      Liver Function Tests: Recent Labs  Lab 03/20/21 1510 03/21/21 0805 03/23/21 1337 03/24/21 0321 03/25/21 0547  AST 73* 44* 27 29 23   ALT 46* 43 30 29 26   ALKPHOS 37* 32* 29* 32* 28*  BILITOT 0.9 1.4* 0.9 0.5 0.8  PROT 6.4* 5.4* 5.9* 5.3* 5.1*  ALBUMIN 3.3* 3.0* 3.3* 2.9* 2.8*    No results for input(s): LIPASE, AMYLASE  in the last 168 hours. No results for input(s): AMMONIA in the last 168 hours.  ABG    Component Value Date/Time   PHART 7.50 (H) 03/21/2021 0525   PCO2ART 40 03/21/2021 0525  PO2ART 120 (H) 03/21/2021 0525   HCO3 31.2 (H) 03/21/2021 0525   O2SAT 99.0 03/21/2021 0525     Coagulation Profile: Recent Labs  Lab 03/20/21 1510  INR 1.1     Cardiac Enzymes: No results for input(s): CKTOTAL, CKMB, CKMBINDEX, TROPONINI in the last 168 hours.  HbA1C: Hgb A1c MFr Bld  Date/Time Value Ref Range Status  01/08/2021 06:28 AM 4.9 4.8 - 5.6 % Final    Comment:    (NOTE) Pre diabetes:          5.7%-6.4%  Diabetes:              >6.4%  Glycemic control for   <7.0% adults with diabetes   06/06/2020 12:07 PM 5.3 4.8 - 5.6 % Final    Comment:    (NOTE) Pre diabetes:          5.7%-6.4%  Diabetes:              >6.4%  Glycemic control for   <7.0% adults with diabetes     CBG: Recent Labs  Lab 03/25/21 1941 03/25/21 2353 03/26/21 0315 03/26/21 0726 03/26/21 1134  GLUCAP 112* 101* 94 105* 119*    Past Medical History:  She,  has a past medical history of Anemia, Anxiety, Arthritis, Atrial fibrillation (HCC), C. difficile colitis (09/2015), COPD (chronic obstructive pulmonary disease) (HCC), Depression, Dyspnea, Dysrhythmia, Fibromyalgia, H/O tracheostomy, Hep C w/ coma, chronic, Hypertension, MRSA pneumonia (HCC) (2017), On home oxygen therapy, Osteoporosis, Peripheral neuropathy, RLS (restless legs syndrome), S/P percutaneous endoscopic gastrostomy (PEG) tube placement (HCC) (09/2015), and Ventilator associated pneumonia (HCC) (10/2015).   Surgical History:   Past Surgical History:  Procedure Laterality Date   ABDOMINAL HYSTERECTOMY     BREAST SURGERY Bilateral    Breast Implants   DILATION AND CURETTAGE OF UTERUS     PEG PLACEMENT N/A 06/21/2020   Procedure: PERCUTANEOUS ENDOSCOPIC GASTROSTOMY (PEG) PLACEMENT;  Surgeon: Regis Bill, MD;  Location: [facility]  ENDOSCOPY;  Service: Endoscopy;  Laterality: N/A;   REVERSE SHOULDER ARTHROPLASTY Right 03/06/2017   Procedure: REVERSE SHOULDER ARTHROPLASTY;  Surgeon: Christena Flake, MD;  Location: [facility] ORS;  Service: Orthopedics;  Laterality: Right;   ROTATOR CUFF REPAIR Bilateral    TRACHEOSTOMY TUBE PLACEMENT N/A 06/15/2020   Procedure: TRACHEOSTOMY;  Surgeon: Geanie Logan, MD;  Location: [facility] ORS;  Service: ENT;  Laterality: N/A;     Social History:   reports that she quit smoking about 5 years ago. Her smoking use included cigarettes. She smoked an average of 1.5 packs per day. She has never used smokeless tobacco. She reports that she does not drink alcohol and does not use drugs.   Family History:  Her family history includes Hypertension in her mother.   Allergies No Known Allergies   Home Medications  Prior to Admission medications   Medication Sig Start Date End Date Taking? Authorizing Provider  albuterol (VENTOLIN HFA) 108 (90 Base) MCG/ACT inhaler Inhale 2 puffs into the lungs every 4 (four) hours as needed for wheezing or shortness of breath.    [provider]  amitriptyline (ELAVIL) 25 MG tablet Take 25 mg by mouth at bedtime.  01/14/19   [provider]  apixaban (ELIQUIS) 5 MG TABS tablet Take 1 tablet (5 mg total) by mouth 2 (two) times daily. 11/10/16   Adrian Saran, MD  budesonide (PULMICORT) 0.5 MG/2ML nebulizer solution Take 0.5 mg by nebulization daily. 11/28/20   [provider]  diazepam (VALIUM) 5 MG tablet Take  1-1.5 tablets (5-7.5 mg total) by mouth every 8 (eight) hours as needed for anxiety. 03/12/21   Osvaldo Shipper, MD  diltiazem (CARDIZEM CD) 120 MG 24 hr capsule Take 120 mg by mouth daily. 04/29/19   [provider]  estradiol (ESTRACE) 1 MG tablet Take 1 mg by mouth daily. 09/10/16   [provider]  gabapentin (NEURONTIN) 300 MG capsule Take 300 mg by mouth 3 (three) times daily. 05/06/20   [provider]   ipratropium-albuterol (DUONEB) 0.5-2.5 (3) MG/3ML SOLN Take 3 mLs by nebulization every 6 (six) hours. 02/23/21   Salena Saner, MD  iron polysaccharides (NIFEREX) 150 MG capsule Take 1 capsule (150 mg total) by mouth daily. 01/12/21 04/12/21  Gillis Santa, MD  MAGNESIUM-OXIDE 400 (241.3 Mg) MG tablet Take 400 mg by mouth daily.  08/24/16   [provider]  methocarbamol (ROBAXIN) 500 MG tablet Take 500 mg by mouth 2 (two) times daily. 12/27/20   [provider]  metoprolol tartrate (LOPRESSOR) 25 MG tablet Take 1 tablet (25 mg total) by mouth 2 (two) times daily. 03/12/21 06/10/21  Osvaldo Shipper, MD  omeprazole (PRILOSEC) 40 MG capsule Take 40 mg by mouth daily. 04/16/19   [provider]  predniSONE (DELTASONE) 5 MG tablet Take 5 mg by mouth daily. 12/25/20   [provider]  QUEtiapine (SEROQUEL) 25 MG tablet Take 25 mg by mouth at bedtime.  10/01/16   [provider]  rosuvastatin (CRESTOR) 10 MG tablet Take 10 mg by mouth at bedtime. 10/23/20   [provider]  vitamin B-12 100 MCG tablet Take 1 tablet (100 mcg total) by mouth daily. 01/12/21 04/12/21  Gillis Santa, MD  Vitamin D, Ergocalciferol, (DRISDOL) 1.25 MG (50000 UNIT) CAPS capsule Take 1 capsule (50,000 Units total) by mouth every 7 (seven) days. 01/17/21 04/17/21  Gillis Santa, MD    Scheduled Meds:  amitriptyline  25 mg Per Tube QHS   apixaban  5 mg Per Tube BID   budesonide (PULMICORT) nebulizer solution  0.25 mg Nebulization BID   chlorhexidine  15 mL Mouth Rinse BID   Chlorhexidine Gluconate Cloth  6 each Topical Q0600   docusate  100 mg Per Tube BID   free water  60 mL Per Tube Q4H   gabapentin  300 mg Per Tube TID   ipratropium-albuterol  3 mL Nebulization Q6H   iron polysaccharides  150 mg Per Tube Daily   lidocaine  1 patch Transdermal Q24H   magnesium oxide  400 mg Per Tube Daily   mouth rinse  15 mL Mouth Rinse q12n4p   methocarbamol  500 mg Per Tube BID    methylPREDNISolone (SOLU-MEDROL) injection  20 mg Intravenous Q24H   metoprolol tartrate  25 mg Per Tube BID   pantoprazole (PROTONIX) IV  40 mg Intravenous Q12H   polyethylene glycol  17 g Per Tube Daily   QUEtiapine  25 mg Per Tube QHS   rosuvastatin  10 mg Per Tube QHS   cyanocobalamin  100 mcg Per Tube Daily   Continuous Infusions:  sodium chloride     cefTAZidime (FORTAZ)  IV Stopped (03/26/21 0546)   feeding supplement (VITAL AF 1.2 CAL) 45 mL/hr at 03/25/21 1900   PRN Meds:.diazepam, docusate, ipratropium-albuterol, metoprolol tartrate, white petrolatum  Critical care provider statement:   Total critical care time: 33 minutes   Performed by: Karna Christmas MD   Critical care time was exclusive of separately billable procedures and treating other patients.   Critical care  was necessary to treat or prevent imminent or life-threatening deterioration.   Critical care was time spent personally by me on the following activities: development of treatment plan with patient and/or surrogate as well as nursing, discussions with consultants, evaluation of patient's response to treatment, examination of patient, obtaining history from patient or surrogate, ordering and performing treatments and interventions, ordering and review of laboratory studies, ordering and review of radiographic studies, pulse oximetry and re-evaluation of patient's condition.    Vida Rigger, M.D.  Pulmonary & Critical Care Medicine

## 2021-03-26 NOTE — Consult Note (Signed)
[facility] Face-to-Face Psychiatry Consult   Reason for Consult: Consult for 70 year old woman with a history of anxiety currently in the ICU after severe respiratory distress.  Concern about her anxiety. Referring Physician:  Karna Christmas Patient Identification: Virginia [name] MRN:  [id number] Principal Diagnosis: Generalized anxiety disorder Diagnosis:  Principal Problem:   Generalized anxiety disorder Active Problems:   Acute on chronic respiratory failure with hypoxemia (HCC)   Total Time spent with patient: 1 hour  Subjective:   Virginia [name] is a 70 y.o. female patient admitted with "my breathing".  HPI: Patient seen chart reviewed.  70 year old woman currently in the ICU.  Was found at home severely hypoxic.  Concerned now about ongoing complaints of anxiety in the intensive care unit.  Patient complains of feeling nervous and anxious all the time.  Worse than at home.  She has been given diazepam 5 mg every 6 hours as needed but of course is not getting it each time regularly.  Patient says at home she normally takes 7.5 mg of diazepam 3 times a day and stays on schedule with it.  Patient denies feeling depressed but does say that if it were not for her son she would probably have strong suicidal thoughts.  As it is the thoughts occur to her but she has no plan or intent of carrying through on it.  She does not see a psychiatrist but gets her medication from her primary care doctor.  In addition to the diazepam she is prescribed gabapentin for neuropathy and possibly anxiety and also a low-dose of Seroquel at night which she feels helps with her sleep.  Patient is not describing any psychotic symptoms.  No hallucinations no delusions.  No active suicidal thoughts no homicidal thoughts.  Seems a little confused at times.  Understands she is in the hospital for her breathing understand she has a tracheostomy.  Some of her other replies indicate some confusion.  Patient at first denied having a  feeding tube.  Later when I ask her about the tube in her nose she called it a feeding tube.  She tells me that her chief concern is to get home as fast as possible but it looks from the chart like the plan is for her to go to skilled nursing or similar kind of disposition.  Past Psychiatric History: Patient has a past history of depression and anxiety.  She has had psychiatric hospitalizations but it has been many years so far in the past that I cannot located in the chart.  She says she has had suicide attempts in the past but that was also many years ago.  She has thought of herself as having primarily chronic anxiety.  She says she has been on multiple serotonin reuptake inhibitors and could not tolerate any of them.  Denies violence denies psychosis.  Admits that she used to have an alcohol problem but says she quit that quite a while ago.  Does not seem to feel that her benzodiazepine use is a problem  Risk to Self:   Risk to Others:   Prior Inpatient Therapy:   Prior Outpatient Therapy:    Past Medical History:  Past Medical History:  Diagnosis Date  . Anemia   . Anxiety   . Arthritis   . Atrial fibrillation (HCC)   . C. difficile colitis 09/2015  . COPD (chronic obstructive pulmonary disease) (HCC)   . Depression   . Dyspnea   . Dysrhythmia   . Fibromyalgia   .  H/O tracheostomy   . Hep C w/ coma, chronic   . Hypertension   . MRSA pneumonia (HCC) 2017  . On home oxygen therapy    3 L/M   . Osteoporosis   . Peripheral neuropathy   . RLS (restless legs syndrome)   . S/P percutaneous endoscopic gastrostomy (PEG) tube placement (HCC) 09/2015  . Ventilator associated pneumonia [facility]) 10/2015   [facility], [place]    Past Surgical History:  Procedure Laterality Date  . ABDOMINAL HYSTERECTOMY    . BREAST SURGERY Bilateral    Breast Implants  . DILATION AND CURETTAGE OF UTERUS    . PEG PLACEMENT N/A 06/21/2020   Procedure: PERCUTANEOUS ENDOSCOPIC GASTROSTOMY (PEG)  PLACEMENT;  Surgeon: Regis Bill, MD;  Location: [facility] ENDOSCOPY;  Service: Endoscopy;  Laterality: N/A;  . REVERSE SHOULDER ARTHROPLASTY Right 03/06/2017   Procedure: REVERSE SHOULDER ARTHROPLASTY;  Surgeon: Christena Flake, MD;  Location: [facility] ORS;  Service: Orthopedics;  Laterality: Right;  . ROTATOR CUFF REPAIR Bilateral   . TRACHEOSTOMY TUBE PLACEMENT N/A 06/15/2020   Procedure: TRACHEOSTOMY;  Surgeon: Geanie Logan, MD;  Location: [facility] ORS;  Service: ENT;  Laterality: N/A;   Family History:  Family History  Problem Relation Age of Onset  . Hypertension Mother    Family Psychiatric  History: Unknown Social History:  Social History   Substance and Sexual Activity  Alcohol Use No     Social History   Substance and Sexual Activity  Drug Use No    Social History   Socioeconomic History  . Marital status: Single    Spouse name: Not on file  . Number of children: Not on file  . Years of education: Not on file  . Highest education level: Not on file  Occupational History  . Not on file  Tobacco Use  . Smoking status: Former    Packs/day: 1.50    Types: Cigarettes    Quit date: 10/05/2015    Years since quitting: 5.4  . Smokeless tobacco: Never  Vaping Use  . Vaping Use: Some days  . Last attempt to quit: 10/05/2015  Substance and Sexual Activity  . Alcohol use: No  . Drug use: No  . Sexual activity: Not on file  Other Topics Concern  . Not on file  Social History Narrative  . Not on file   Social Determinants of Health   Financial Resource Strain: Not on file  Food Insecurity: Not on file  Transportation Needs: Not on file  Physical Activity: Not on file  Stress: Not on file  Social Connections: Not on file   Additional Social History:    Allergies:  No Known Allergies  Labs:  Results for orders placed or performed during the hospital encounter of 03/20/21 (from the past 48 hour(s))  Glucose, capillary     Status: Abnormal   Collection Time:  03/24/21  7:31 PM  Result Value Ref Range   Glucose-Capillary 135 (H) 70 - 99 mg/dL    Comment: Glucose reference range applies only to samples taken after fasting for at least 8 hours.   Comment 1 Notify RN    Comment 2 Document in Chart   Glucose, capillary     Status: Abnormal   Collection Time: 03/24/21 11:12 PM  Result Value Ref Range   Glucose-Capillary 103 (H) 70 - 99 mg/dL    Comment: Glucose reference range applies only to samples taken after fasting for at least 8 hours.   Comment 1 Notify RN  Comment 2 Document in Chart   Glucose, capillary     Status: Abnormal   Collection Time: 03/25/21  3:10 AM  Result Value Ref Range   Glucose-Capillary 109 (H) 70 - 99 mg/dL    Comment: Glucose reference range applies only to samples taken after fasting for at least 8 hours.   Comment 1 Notify RN    Comment 2 Document in Chart   CBC     Status: Abnormal   Collection Time: 03/25/21  5:47 AM  Result Value Ref Range   WBC 8.6 4.0 - 10.5 K/uL   RBC 2.63 (L) 3.87 - 5.11 MIL/uL   Hemoglobin 8.1 (L) 12.0 - 15.0 g/dL   HCT 16.1 (L) 09.6 - 04.5 %   MCV 95.4 80.0 - 100.0 fL   MCH 30.8 26.0 - 34.0 pg   MCHC 32.3 30.0 - 36.0 g/dL   RDW 40.9 (H) 81.1 - 91.4 %   Platelets 364 150 - 400 K/uL   nRBC 0.4 (H) 0.0 - 0.2 %    Comment: Performed at [facility], [address] [place], [place] [address]  Comprehensive metabolic panel     Status: Abnormal   Collection Time: 03/25/21  5:47 AM  Result Value Ref Range   Sodium 135 135 - 145 mmol/L   Potassium 3.8 3.5 - 5.1 mmol/L   Chloride 94 (L) 98 - 111 mmol/L   CO2 36 (H) 22 - 32 mmol/L   Glucose, Bld 108 (H) 70 - 99 mg/dL    Comment: Glucose reference range applies only to samples taken after fasting for at least 8 hours.   BUN 19 8 - 23 mg/dL   Creatinine, Ser 6.21 0.44 - 1.00 mg/dL   Calcium 8.2 (L) 8.9 - 10.3 mg/dL   Total Protein 5.1 (L) 6.5 - 8.1 g/dL   Albumin 2.8 (L) 3.5 - 5.0 g/dL   AST 23 15 - 41 U/L   ALT 26 0 - 44  U/L   Alkaline Phosphatase 28 (L) 38 - 126 U/L   Total Bilirubin 0.8 0.3 - 1.2 mg/dL   GFR, Estimated >30 >86 mL/min    Comment: (NOTE) Calculated using the CKD-EPI Creatinine Equation (2021)    Anion gap 5 5 - 15    Comment: Performed at [facility], [address] [place], [place] [address]  Magnesium     Status: None   Collection Time: 03/25/21  5:47 AM  Result Value Ref Range   Magnesium 2.3 1.7 - 2.4 mg/dL    Comment: Performed at [facility], [address], [place], [place] [address]  Phosphorus     Status: None   Collection Time: 03/25/21  5:47 AM  Result Value Ref Range   Phosphorus 3.1 2.5 - 4.6 mg/dL    Comment: Performed at [facility], [address] [place], [place] [address]  Glucose, capillary     Status: Abnormal   Collection Time: 03/25/21  7:33 AM  Result Value Ref Range   Glucose-Capillary 104 (H) 70 - 99 mg/dL    Comment: Glucose reference range applies only to samples taken after fasting for at least 8 hours.  Glucose, capillary     Status: Abnormal   Collection Time: 03/25/21 11:27 AM  Result Value Ref Range   Glucose-Capillary 132 (H) 70 - 99 mg/dL    Comment: Glucose reference range applies only to samples taken after fasting for at least 8 hours.  Glucose, capillary  Status: Abnormal   Collection Time: 03/25/21  4:16 PM  Result Value Ref Range   Glucose-Capillary 144 (H) 70 - 99 mg/dL    Comment: Glucose reference range applies only to samples taken after fasting for at least 8 hours.  Glucose, capillary     Status: Abnormal   Collection Time: 03/25/21  7:41 PM  Result Value Ref Range   Glucose-Capillary 112 (H) 70 - 99 mg/dL    Comment: Glucose reference range applies only to samples taken after fasting for at least 8 hours.  Glucose, capillary     Status: Abnormal   Collection Time: 03/25/21 11:53 PM  Result Value Ref Range   Glucose-Capillary 101 (H) 70 - 99 mg/dL    Comment: Glucose reference range applies  only to samples taken after fasting for at least 8 hours.  Glucose, capillary     Status: None   Collection Time: 03/26/21  3:15 AM  Result Value Ref Range   Glucose-Capillary 94 70 - 99 mg/dL    Comment: Glucose reference range applies only to samples taken after fasting for at least 8 hours.  Magnesium     Status: None   Collection Time: 03/26/21  5:23 AM  Result Value Ref Range   Magnesium 2.0 1.7 - 2.4 mg/dL    Comment: Performed at [facility], [address] [place], [place] [address]  Phosphorus     Status: None   Collection Time: 03/26/21  5:23 AM  Result Value Ref Range   Phosphorus 3.4 2.5 - 4.6 mg/dL    Comment: Performed at [facility], [address] [place], [place] [address]  Basic metabolic panel     Status: Abnormal   Collection Time: 03/26/21  5:23 AM  Result Value Ref Range   Sodium 137 135 - 145 mmol/L   Potassium 3.6 3.5 - 5.1 mmol/L   Chloride 96 (L) 98 - 111 mmol/L   CO2 35 (H) 22 - 32 mmol/L   Glucose, Bld 102 (H) 70 - 99 mg/dL    Comment: Glucose reference range applies only to samples taken after fasting for at least 8 hours.   BUN 20 8 - 23 mg/dL   Creatinine, Ser 0.98 0.44 - 1.00 mg/dL   Calcium 8.3 (L) 8.9 - 10.3 mg/dL   GFR, Estimated >11 >91 mL/min    Comment: (NOTE) Calculated using the CKD-EPI Creatinine Equation (2021)    Anion gap 6 5 - 15    Comment: Performed at [facility], [address] [place], [place] [address]  CBC     Status: Abnormal   Collection Time: 03/26/21  5:23 AM  Result Value Ref Range   WBC 10.6 (H) 4.0 - 10.5 K/uL   RBC 2.71 (L) 3.87 - 5.11 MIL/uL   Hemoglobin 8.2 (L) 12.0 - 15.0 g/dL   HCT 56.2 (L) 13.0 - 86.5 %   MCV 96.7 80.0 - 100.0 fL   MCH 30.3 26.0 - 34.0 pg   MCHC 31.3 30.0 - 36.0 g/dL   RDW 78.4 (H) 69.6 - 29.5 %   Platelets 388 150 - 400 K/uL   nRBC 0.2 0.0 - 0.2 %    Comment: Performed at [facility], [address] [place], [place] [address]  Glucose,  capillary     Status: Abnormal   Collection Time: 03/26/21  7:26 AM  Result Value Ref Range   Glucose-Capillary 105 (H) 70 - 99 mg/dL    Comment: Glucose reference range applies  only to samples taken after fasting for at least 8 hours.  Glucose, capillary     Status: Abnormal   Collection Time: 03/26/21 11:34 AM  Result Value Ref Range   Glucose-Capillary 119 (H) 70 - 99 mg/dL    Comment: Glucose reference range applies only to samples taken after fasting for at least 8 hours.  Glucose, capillary     Status: Abnormal   Collection Time: 03/26/21  3:32 PM  Result Value Ref Range   Glucose-Capillary 158 (H) 70 - 99 mg/dL    Comment: Glucose reference range applies only to samples taken after fasting for at least 8 hours.    Current Facility-Administered Medications  Medication Dose Route Frequency Provider Last Rate Last Admin  . 0.9 %  sodium chloride infusion  250 mL Intravenous Continuous Ouma, Hubbard Hartshorn, NP      . amitriptyline (ELAVIL) tablet 25 mg  25 mg Per Tube QHS Graves, Rosalva Ferron, NP      . apixaban (ELIQUIS) tablet 5 mg  5 mg Per Tube BID Erin Fulling, MD   5 mg at 03/26/21 1006  . budesonide (PULMICORT) nebulizer solution 0.25 mg  0.25 mg Nebulization BID Jimmye Norman, NP   0.25 mg at 03/26/21 0859  . cefTAZidime (FORTAZ) 2 g in sodium chloride 0.9 % 100 mL IVPB  2 g Intravenous Q8H Erin Fulling, MD   Stopped at 03/26/21 1557  . chlorhexidine (PERIDEX) 0.12 % solution 15 mL  15 mL Mouth Rinse BID Erin Fulling, MD   15 mL at 03/26/21 1006  . Chlorhexidine Gluconate Cloth 2 % PADS 6 each  6 each Topical [id number] Erin Fulling, MD   6 each at 03/26/21 1129  . diazepam (VALIUM) tablet 7.5 mg  7.5 mg Oral Q8H Bianka Liberati T, MD      . docusate (COLACE) 50 MG/5ML liquid 100 mg  100 mg Per Tube BID Jimmye Norman, NP   100 mg at 03/26/21 1008  . docusate (COLACE) 50 MG/5ML liquid 100 mg  100 mg Per Tube BID PRN Erin Fulling, MD      . feeding supplement (VITAL AF  1.2 CAL) liquid 1,000 mL  1,000 mL Per Tube Continuous Erin Fulling, MD 45 mL/hr at 03/26/21 1518 1,000 mL at 03/26/21 1518  . free water 60 mL  60 mL Per Tube Q4H Erin Fulling, MD   60 mL at 03/26/21 1518  . gabapentin (NEURONTIN) capsule 300 mg  300 mg Per Tube TID Erin Fulling, MD   300 mg at 03/26/21 1527  . hydrOXYzine (ATARAX/VISTARIL) tablet 25 mg  25 mg Per Tube TID PRN Lianne Cure, NP   25 mg at 03/26/21 1518  . ipratropium-albuterol (DUONEB) 0.5-2.5 (3) MG/3ML nebulizer solution 3 mL  3 mL Nebulization Q6H Jimmye Norman, NP   3 mL at 03/26/21 1354  . ipratropium-albuterol (DUONEB) 0.5-2.5 (3) MG/3ML nebulizer solution 3 mL  3 mL Nebulization Q6H PRN Jimmye Norman, NP      . iron polysaccharides (NIFEREX) capsule 150 mg  150 mg Per Tube Daily Erin Fulling, MD   150 mg at 03/26/21 1009  . lidocaine (LIDODERM) 5 % 1 patch  1 patch Transdermal Q24H Lianne Cure, NP   1 patch at 03/26/21 1351  . magnesium oxide (MAG-OX) tablet 400 mg  400 mg Per Tube Daily Erin Fulling, MD   400 mg at 03/26/21 1008  . MEDLINE mouth rinse  15 mL Mouth Rinse q12n4p Kasa, Kurian,  MD   15 mL at 03/26/21 1518  . methocarbamol (ROBAXIN) tablet 500 mg  500 mg Per Tube BID Erin Fulling, MD   500 mg at 03/26/21 1006  . methylPREDNISolone sodium succinate (SOLU-MEDROL) 40 mg/mL injection 20 mg  20 mg Intravenous Q24H Harlon Ditty D, NP   20 mg at 03/26/21 1007  . metoprolol tartrate (LOPRESSOR) injection 2.5-5 mg  2.5-5 mg Intravenous Q6H PRN Lianne Cure, NP      . metoprolol tartrate (LOPRESSOR) tablet 25 mg  25 mg Per Tube BID Erin Fulling, MD   25 mg at 03/26/21 1006  . pantoprazole (PROTONIX) injection 40 mg  40 mg Intravenous Q12H Jimmye Norman, NP   40 mg at 03/26/21 1009  . polyethylene glycol (MIRALAX / GLYCOLAX) packet 17 g  17 g Per Tube Daily Jimmye Norman, NP   17 g at 03/26/21 1008  . QUEtiapine (SEROQUEL) tablet 25 mg  25 mg Per Tube QHS Erin Fulling, MD    25 mg at 03/25/21 2120  . rosuvastatin (CRESTOR) tablet 10 mg  10 mg Per Tube QHS Erin Fulling, MD   10 mg at 03/25/21 2121  . vitamin B-12 (CYANOCOBALAMIN) tablet 100 mcg  100 mcg Per Tube Daily Erin Fulling, MD   100 mcg at 03/26/21 1006  . white petrolatum (VASELINE) gel   Topical PRN Erin Fulling, MD        Musculoskeletal: Strength & Muscle Tone: within normal limits Gait & Station: unable to stand Patient leans: N/A            Psychiatric Specialty Exam:  Presentation  General Appearance:  No data recorded Eye Contact: No data recorded Speech: No data recorded Speech Volume: No data recorded Handedness: No data recorded  Mood and Affect  Mood: No data recorded Affect: No data recorded  Thought Process  Thought Processes: No data recorded Descriptions of Associations:No data recorded Orientation:No data recorded Thought Content:No data recorded History of Schizophrenia/Schizoaffective disorder:No data recorded Duration of Psychotic Symptoms:No data recorded Hallucinations:No data recorded Ideas of Reference:No data recorded Suicidal Thoughts:No data recorded Homicidal Thoughts:No data recorded  Sensorium  Memory: No data recorded Judgment: No data recorded Insight: No data recorded  Executive Functions  Concentration: No data recorded Attention Span: No data recorded Recall: No data recorded Fund of Knowledge: No data recorded Language: No data recorded  Psychomotor Activity  Psychomotor Activity: No data recorded  Assets  Assets: No data recorded  Sleep  Sleep: No data recorded  Physical Exam: Physical Exam Vitals and nursing note reviewed.  Constitutional:      Appearance: Normal appearance.  HENT:     Head: Normocephalic and atraumatic.     Mouth/Throat:     Pharynx: Oropharynx is clear.  Eyes:     Pupils: Pupils are equal, round, and reactive to light.  Cardiovascular:     Rate and Rhythm: Normal rate and  regular rhythm.  Pulmonary:     Effort: Pulmonary effort is normal.     Breath sounds: Normal breath sounds.  Abdominal:     General: Abdomen is flat.     Palpations: Abdomen is soft.  Musculoskeletal:        General: Normal range of motion.  Skin:    General: Skin is warm and dry.  Neurological:     General: No focal deficit present.     Mental Status: She is alert. Mental status is at baseline.  Psychiatric:  Attention and Perception: Attention normal.        Mood and Affect: Mood is anxious.        Speech: Speech normal.        Behavior: Behavior is cooperative.        Thought Content: Thought content is not paranoid. Thought content includes suicidal ideation. Thought content does not include suicidal plan.        Cognition and Memory: Cognition is impaired. Memory is impaired.        Judgment: Judgment is impulsive.   Review of Systems  Constitutional: Negative.   HENT: Negative.    Eyes: Negative.   Respiratory: Negative.    Cardiovascular: Negative.   Gastrointestinal: Negative.   Musculoskeletal: Negative.   Skin: Negative.   Neurological: Negative.   Psychiatric/Behavioral:  Positive for suicidal ideas. Negative for depression, hallucinations and substance abuse. The patient is nervous/anxious and has insomnia.   Blood pressure (!) 143/93, pulse 99, temperature 98.6 F (37 C), temperature source Oral, resp. rate 20, height 5' (1.524 m), weight 52.8 kg, SpO2 97 %. Body mass index is 22.73 kg/m.  Treatment Plan Summary: Medication management and Plan 70 year old woman who is chronically on Valium.  Like many patients chronically on benzodiazepines she is psychologically very dependent on it and does not do well with having to rely on asking for it as needed or getting a slightly different dose than what she is used to.  I am going to change her back to 7.5 mg to be given by 2 every 8 hours which will replicate her usual daytime schedule.  She describes that it is  taking the medicine absolutely first thing in the morning and last thing before she goes to bed and once in the middle of the day.  Not going to change any other medicines.  No need for inpatient psychiatric treatment.  We will follow as usual.  Disposition: No evidence of imminent risk to self or others at present.   Supportive therapy provided about ongoing stressors. Discussed crisis plan, support from social network, calling 911, coming to the Emergency Department, and calling Suicide Hotline.  Mordecai Rasmussen, MD 03/26/2021 5:01 PM

## 2021-03-26 NOTE — Progress Notes (Signed)
Physical Therapy Treatment Patient Details Name: Virginia [name] MRN: [id number] DOB: [birth year] Today's Date: 03/26/2021   History of Present Illness This is a 70 yo female with end stage COPD and chronic tracheostomy who presented to [facility] ER via EMS from home on 09/27 due to unresponsiveness. Patient recently discharged    PT Comments    Patient is making progress towards functional independence. Patient was agreeable and cooperative during session. Patient with vent support during session with PEEP 5 and Fi02 28% via trach. Patient was able to stand, march in place, and take several side steps along edge of bed with Min guard assistance. Minimal anxiety reported during session with no significant change in vitals noted with activity. Recommend to continue PT to maximize independence and decrease caregiver burden. SNF recommended at discharge.    Recommendations for follow up therapy are one component of a multi-disciplinary discharge planning process, led by the attending physician.  Recommendations may be updated based on patient status, additional functional criteria and insurance authorization.  Follow Up Recommendations  SNF     Equipment Recommendations  None recommended by PT    Recommendations for Other Services       Precautions / Restrictions Precautions Precautions: Fall Precaution Comments: trach with vent support PRN Restrictions Weight Bearing Restrictions: No (Simultaneous filing. User may not have seen previous data.)     Mobility  Bed Mobility Overal bed mobility: Needs Assistance Bed Mobility: Supine to Sit;Sit to Supine Rolling: Supervision (for rolling to left to remove bed pan)   Supine to sit: Min assist;HOB elevated Sit to supine: Min guard;HOB elevated   General bed mobility comments: occasional assistance for trunk support. verbal cues for safety and technique    Transfers Overall transfer level: Needs assistance Equipment used:  None Transfers: Sit to/from Stand Sit to Stand: Min guard         General transfer comment: Min guard for safety. increased time and effort required. no significant change in vitals noted with activity  Ambulation/Gait Ambulation/Gait assistance: Min guard Gait Distance (Feet): 2 Feet Assistive device: 1 person hand held assist       General Gait Details: patient able to march in place for ~ 5 seconds and take several steps, both forward/backward and to the side. no loss of balance. patient likely could have ambulated further, however limited by vent tubing. no significant change in vitals with activity   Stairs             Wheelchair Mobility    Modified Rankin (Stroke Patients Only)       Balance Overall balance assessment: Needs assistance Sitting-balance support: Feet supported Sitting balance-Leahy Scale: Good     Standing balance support: Single extremity supported Standing balance-Leahy Scale: Fair Standing balance comment: no loss of balance noted. Min guard/close stand by assistance for safety                            Cognition Arousal/Alertness: Awake/alert Behavior During Therapy: WFL for tasks assessed/performed (patient indicated mild anxiety and appears mildly anxious with mobility, otherwise Navos for asks assessed) Overall Cognitive Status:  (grossly WFL with difficulty to fully assess given trach/vent)                                        Exercises General Exercises - Lower Extremity Long Arc Quad: AROM;Strengthening;Both;10  reps;Seated Other Exercises Other Exercises: verbal cues for exercise technique for strengthening    General Comments General comments (skin integrity, edema, etc.): patient on trach with vent suppor at PEEP 5 and Fi02 28% during session. heart rate 90's with activity, Sp02 99%, blood pressure 117/81. patient had bowel movement on bed pan during session.      Pertinent Vitals/Pain Pain  Assessment: No/denies pain    Home Living                      Prior Function            PT Goals (current goals can now be found in the care plan section) Acute Rehab PT Goals Patient Stated Goal: to go home PT Goal Formulation: With patient Time For Goal Achievement: 04/05/21 Potential to Achieve Goals: Good Progress towards PT goals: Progressing toward goals    Frequency    Min 2X/week      PT Plan Current plan remains appropriate    Co-evaluation              AM-PAC PT "6 Clicks" Mobility   Outcome Measure  Help needed turning from your back to your side while in a flat bed without using bedrails?: A Little Help needed moving from lying on your back to sitting on the side of a flat bed without using bedrails?: A Little Help needed moving to and from a bed to a chair (including a wheelchair)?: A Lot Help needed standing up from a chair using your arms (e.g., wheelchair or bedside chair)?: A Little Help needed to walk in hospital room?: A Lot Help needed climbing 3-5 steps with a railing? : A Lot 6 Click Score: 15    End of Session Equipment Utilized During Treatment:  (vent support) Activity Tolerance: Patient tolerated treatment well Patient left: in bed;with call bell/phone within reach;with bed alarm set Nurse Communication: Mobility status PT Visit Diagnosis: Difficulty in walking, not elsewhere classified (R26.2);Other abnormalities of gait and mobility (R26.89);Dizziness and giddiness (R42)     Time: 3532-9924 PT Time Calculation (min) (ACUTE ONLY): 28 min  Charges:  $Therapeutic Activity: 23-37 mins                     [name], PT, MPT    [name] 03/26/2021, 8:59 AM

## 2021-03-26 NOTE — Progress Notes (Signed)
NAME:  Virginia [name], MRN:  [id number], DOB:  30-Dec-1950, LOS: 6 ADMISSION DATE:  03/20/2021, CONSULTATION DATE: 03/20/2021 REFERRING MD: Dr. Vicente Males, CHIEF COMPLAINT: Unresponsiveness   History of Present Illness:  This is a 70 yo female with end stage COPD and chronic tracheostomy who presented to [facility] ER via EMS from home on 09/27 due to unresponsiveness.  Per ER notes EMS reported pts initial GCS was 3 and O2 sats were unreadable.  EMS provided bag ventilation via tracheostomy en route to the ER.  Pt also received 2 mg of narcan and initially became responsive.  Pts son informed EMS on the scene pts code status DNR, however there was no DNR paperwork readily available on the scene.  Pt recently hospitalized at [facility] from 08/28~09/19 following treatment of acute on chronic hypoxic hypercapnic respiratory failure secondary to AECOPD and enterobacter aerogenes bronchitis.    ED course Upon arrival to the ER pt unresponsive to verbal/painful stimulation with O2 sats initially in the mid 90's on RA.  However, pt immediately became hypoxic with O2 sats decreasing to 60% with shallow respirations on RA, requiring bag ventilation via tracheostomy.  Due to continued hypoxia pts #6 cuffless tracheostomy was changed to a #6 cuffed trach, and pt placed on mechanical ventilation via trach with O2 sats increasing to 92%.  Upon removal of cuffless trach RT noted pts inner canula was partially occluded by dried secretions.  VBG revealed pH 7.15/pCO2 118/acid-base excess 9.2/bicarb 41.1.  CXR concerning for bilateral trace pleural effusions and emphysema.  Code sepsis activated by ER physician pt received 1L LR bolus, ceftriaxone, azithromycin, and vancomycin due to concern of pneumonia.  PCCM team contacted for ICU admission.   Pertinent  Medical History  Atrial Fibrillation on Eliquis C. Difficile Severe COPD Chronic tracheostomy Anxiety & depression Hepatitis  C Arthritis HTN Fibromyalgia Anemia CAD Thoracic AAA  Significant Hospital Events: Including procedures, antibiotic start and stop dates in addition to other pertinent events   09/27: Pt admitted to ICU with acute on chronic hypoxic hypercapnic respiratory failure secondary to end stage COPD requiring mechanical ventilation via chronic tracheostomy  09/29: Tolerating PSV, will attempt TCT as tolerated 09/30: Unable to tolerate weaning pressures on PSV (immediately becomes extremely anxious and SOB with hypooxia), add scheduled Valium, plan to place Dobhoff for enteral access 10/2: Tolerating trach collars trials at 35%.  10/3: Eliquis restarted 10/1 after no plans for GI procedure and no further evidence of GI bleeding with hgb stable >8.0 10/4: No acute events overnight. Benzos adjusted yesterday per psychiatry, IV Valium and IV morphine discontinued.   Interim History / Subjective:  -No acute events reported overnight   Objective   Blood pressure 117/81, pulse 88, temperature 98.2 F (36.8 C), temperature source Oral, resp. rate (!) 23, height 5' (1.524 m), weight 52.8 kg, SpO2 96 %.    Vent Mode: PRVC FiO2 (%):  [28 %-35 %] 28 % Set Rate:  [14 bmp] 14 bmp Vt Set:  [450 mL] 450 mL PEEP:  [5 cmH20] 5 cmH20 Plateau Pressure:  [14 cmH20] 14 cmH20   Intake/Output Summary (Last 24 hours) at 03/26/2021 0811 Last data filed at 03/26/2021 0400 Gross per 24 hour  Intake 2202 ml  Output 1200 ml  Net 1002 ml   Filed Weights   03/22/21 0500 03/23/21 0500 03/26/21 0422  Weight: 55.4 kg 55 kg 52.8 kg   Examination: General: chronically ill appearing frail female, NAD, mechanically ventilated via chronic tracheostomy HENT: #6 Shiley  midline chronic tracheostomy, no JVD  Lungs: mechanical breath sounds throughout, no wheezing or rales noted, synchronous with vent, even, non labored  Cardiovascular: Tachycardia, regular rhythm, no R/G, 2+ radial/1+ distal pulses trace bilateral lower  extremity edema  Abdomen: +BS x4, soft, non tender, non distended  Extremities: normal tone, no edema  Neuro: awake, alert, following commands, no focal deficits, mouthing words and needs, pupils PERRL GU: External catheter in place  Resolved Hospital Problem list   Lactic acidosis  Assessment & Plan:   Acute on chronic hypoxic hypercapnic respiratory failure secondary to AECOPD and mucous plugging & questionable underlying pneumonia Enterobacter 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 03/21/21 Mechanical ventilation via chronic tracheostomy  Hx: Chronic Home O2 @4 .5~5L during the day and trilogy with capped tracheostomy qhs -Trach collar trials during the day with vent as needed at night -Plateau pressures less than 30 cm H20 -Wean FiO2 & PEEP as tolerated to maintain O2 sats >88% - Ceftazidime started 10/1    - s/p azithromycin, ceftriaxone 9/28, one time dose vanco, ceftriaxone, azithromycin 9/27 -Follow intermittent Chest X-ray & ABG as needed -Attempt SBT's and trach collar trials as tolerated -May require ventilator support qhs -Implement VAP Bundle -Scheduled & Prn Bronchodilators -IV and Nebulized steroids - Ensure adequate pulmonary hygiene   Mildly elevated troponin likely secondary to demand ischemia  Chronic atrial fibrillation on Eliquis SVT  Hx: HTN and CAD  -Continuous telemetry monitoring  -Eliquis restarted 10/1 after GI cleared  -Continue Metoprolol, Cardizem, and Lisinopril as BP and renal functions permits -TSH low, but Free T4 normal -Prn metoprolol for hr >115   Acute Blood Loss anemia due to GI Bleed superimposed on Anemia of Chronic Disease, stable Patient with dark tarry stools POA that were Hemoccult positive Hx: Anemia -Monitor for S/Sx of bleeding -Trend CBC - hgb stable > 8.0, patient with hx chronic anemia -SCD's for VTE Prophylaxis, Eliquis restarted 10/1 -Transfuse for Hgb <7 -Pantoprazole 40mg  IV  BID -Hold NSAIDs, Anticoagulation, ASA -GI evaluated ~ patient declined EGD, recommends colonoscopy either inpatient or outpatient if pt is agreeable, ok to restart Eliquis and start diet once enteral access obtained, GI signed off 9/29  Acute encephalopathy secondary to hypercapnia >>resolved Anxiety/Panic Attacks with weaning Hx: Anxiety and depression  Benzodiazepine dependency - Maintain RASS goal 0 to -1 - Continue home po meds: diazepam, gabapentin, Seroquel, amitriptyline    - Diazepam tablets increased to 7.5 mg q8h, per tube per psychiatry    - Hydroxyzine 25mg  TID prn per tube added 10/3 - IV Valium and IV morphine DC 10/3 - Monitor EKG, on Seroquel - Minimize use of IV sedatives - Urine drug screen positive for Benzodiazepines - Evaluated by psychiatry on 10/3, appreciate input  - CT head negative for acute intracranial abnormality  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   Elevated liver enzymes likely secondary to hepatitis C hx>>resolved -Trend hepatic function panel prn  Lactic acidosis>>resolved -Trend BMP and lactic acid prn  Best Practice (right click and "Reselect all SmartList Selections" daily)   Diet/type: NPO, tube feeds  DVT prophylaxis: SCD's, Eliquis restarted 10/1 GI prophylaxis: PPI Lines: N/A Foley: N/A Code Status:  full code Last date of multidisciplinary goals of care discussion [03/27/2021]   Labs   CBC: Recent Labs  Lab 03/20/21 1510 03/20/21  2246 03/22/21 0328 03/22/21 0929 03/22/21 2102 03/23/21 1314 03/24/21 0321 03/25/21 0547 03/26/21 0523  WBC 6.5   < > 8.1  --   --  8.4 9.1 8.6 10.6*  NEUTROABS 5.2  --  6.5  --   --   --   --   --   --   HGB 9.6*   < > 7.3*   < > 8.4* 8.7* 8.2* 8.1* 8.2*  HCT 33.6*   < > 23.9*   < > 26.8* 27.6* 25.4*  25.1* 26.2*  MCV 104.7*   < > 97.2  --   --  96.8 94.4 95.4 96.7  PLT 366   < > 301  --   --  318 341 364 388   < > = values in this interval not displayed.    Basic Metabolic Panel: Recent Labs  Lab 03/21/21 0509 03/21/21 1518 03/22/21 0328 03/22/21 0929 03/23/21 1337 03/24/21 0321 03/25/21 0547 03/26/21 0523  NA 142  --   --  142 136 134* 135 137  K 3.8  --   --  3.7 3.5 3.2* 3.8 3.6  CL 95*  --   --  95* 90* 90* 94* 96*  CO2 29  --   --  38* 36* 37* 36* 35*  GLUCOSE 135*  --   --  103* 104* 116* 108* 102*  BUN 15  --   --  13 11 17 19 20   CREATININE 0.96  --   --  0.55 0.57 0.68 0.69 0.66  CALCIUM 8.7*  --   --  8.6* 8.7* 8.6* 8.2* 8.3*  MG 1.4* 2.0 2.1  --   --  1.7 2.3 2.0  PHOS 1.2* 5.0*  --   --   --  2.8 3.1 3.4   GFR: Estimated Creatinine Clearance: 47 mL/min (by C-G formula based on SCr of 0.66 mg/dL). Recent Labs  Lab 03/20/21 1510 03/20/21 1753 03/21/21 0509 03/21/21 03/23/21 03/21/21 2034 03/22/21 0328 03/23/21 1314 03/24/21 0321 03/25/21 0547 03/26/21 0523  PROCALCITON  --  0.42 0.64  --   --  0.35  --   --   --   --   WBC 6.5  --  7.3  --   --  8.1 8.4 9.1 8.6 10.6*  LATICACIDVEN 3.4* 3.2*  --  2.3* 1.1  --   --   --   --   --     Liver Function Tests: Recent Labs  Lab 03/20/21 1510 03/21/21 0805 03/23/21 1337 03/24/21 0321 03/25/21 0547  AST 73* 44* 27 29 23   ALT 46* 43 30 29 26   ALKPHOS 37* 32* 29* 32* 28*  BILITOT 0.9 1.4* 0.9 0.5 0.8  PROT 6.4* 5.4* 5.9* 5.3* 5.1*  ALBUMIN 3.3* 3.0* 3.3* 2.9* 2.8*   No results for input(s): LIPASE, AMYLASE in the last 168 hours. No results for input(s): AMMONIA in the last 168 hours.  ABG    Component Value Date/Time   PHART 7.50 (H) 03/21/2021 0525   PCO2ART 40 03/21/2021 0525   PO2ART 120 (H) 03/21/2021 0525   HCO3 31.2 (H) 03/21/2021 0525   O2SAT 99.0 03/21/2021 0525     Coagulation Profile: Recent Labs  Lab 03/20/21 1510  INR 1.1    Cardiac Enzymes: No results for input(s): CKTOTAL,  CKMB, CKMBINDEX, TROPONINI in the last 168 hours.  HbA1C: Hgb A1c MFr Bld  Date/Time Value Ref Range Status  01/08/2021 06:28 AM 4.9 4.8 - 5.6 % Final  Comment:    (NOTE) Pre diabetes:          5.7%-6.4%  Diabetes:              >6.4%  Glycemic control for   <7.0% adults with diabetes   06/06/2020 12:07 PM 5.3 4.8 - 5.6 % Final    Comment:    (NOTE) Pre diabetes:          5.7%-6.4%  Diabetes:              >6.4%  Glycemic control for   <7.0% adults with diabetes     CBG: Recent Labs  Lab 03/25/21 1616 03/25/21 1941 03/25/21 2353 03/26/21 0315 03/26/21 0726  GLUCAP 144* 112* 101* 94 105*   Past Medical History:  She,  has a past medical history of Anemia, Anxiety, Arthritis, Atrial fibrillation (HCC), C. difficile colitis (09/2015), COPD (chronic obstructive pulmonary disease) (HCC), Depression, Dyspnea, Dysrhythmia, Fibromyalgia, H/O tracheostomy, Hep C w/ coma, chronic, Hypertension, MRSA pneumonia (HCC) (2017), On home oxygen therapy, Osteoporosis, Peripheral neuropathy, RLS (restless legs syndrome), S/P percutaneous endoscopic gastrostomy (PEG) tube placement (HCC) (09/2015), and Ventilator associated pneumonia (HCC) (10/2015).   Surgical History:   Past Surgical History:  Procedure Laterality Date   ABDOMINAL HYSTERECTOMY     BREAST SURGERY Bilateral    Breast Implants   DILATION AND CURETTAGE OF UTERUS     PEG PLACEMENT N/A 06/21/2020   Procedure: PERCUTANEOUS ENDOSCOPIC GASTROSTOMY (PEG) PLACEMENT;  Surgeon: Regis Bill, MD;  Location: [facility] ENDOSCOPY;  Service: Endoscopy;  Laterality: N/A;   REVERSE SHOULDER ARTHROPLASTY Right 03/06/2017   Procedure: REVERSE SHOULDER ARTHROPLASTY;  Surgeon: Christena Flake, MD;  Location: [facility] ORS;  Service: Orthopedics;  Laterality: Right;   ROTATOR CUFF REPAIR Bilateral    TRACHEOSTOMY TUBE PLACEMENT N/A 06/15/2020   Procedure: TRACHEOSTOMY;  Surgeon: Geanie Logan, MD;  Location: [facility] ORS;  Service: ENT;   Laterality: N/A;     Social History:   reports that she quit smoking about 5 years ago. Her smoking use included cigarettes. She smoked an average of 1.5 packs per day. She has never used smokeless tobacco. She reports that she does not drink alcohol and does not use drugs.   Family History:  Her family history includes Hypertension in her mother.   Allergies No Known Allergies   Home Medications  Prior to Admission medications   Medication Sig Start Date End Date Taking? Authorizing Provider  albuterol (VENTOLIN HFA) 108 (90 Base) MCG/ACT inhaler Inhale 2 puffs into the lungs every 4 (four) hours as needed for wheezing or shortness of breath.    [provider]  amitriptyline (ELAVIL) 25 MG tablet Take 25 mg by mouth at bedtime.  01/14/19   [provider]  apixaban (ELIQUIS) 5 MG TABS tablet Take 1 tablet (5 mg total) by mouth 2 (two) times daily. 11/10/16   Adrian Saran, MD  budesonide (PULMICORT) 0.5 MG/2ML nebulizer solution Take 0.5 mg by nebulization daily. 11/28/20   [provider]  diazepam (VALIUM) 5 MG tablet Take 1-1.5 tablets (5-7.5 mg total) by mouth every 8 (eight) hours as needed for anxiety. 03/12/21   Osvaldo Shipper, MD  diltiazem (CARDIZEM CD) 120 MG 24 hr capsule Take 120 mg by mouth daily. 04/29/19   [provider]  estradiol (ESTRACE) 1 MG tablet Take 1 mg by mouth daily. 09/10/16   [provider]  gabapentin (NEURONTIN) 300 MG capsule Take 300 mg by mouth 3 (three) times daily. 05/06/20  [provider]  ipratropium-albuterol (DUONEB) 0.5-2.5 (3) MG/3ML SOLN Take 3 mLs by nebulization every 6 (six) hours. 02/23/21   Salena Saner, MD  iron polysaccharides (NIFEREX) 150 MG capsule Take 1 capsule (150 mg total) by mouth daily. 01/12/21 04/12/21  Gillis Santa, MD  MAGNESIUM-OXIDE 400 (241.3 Mg) MG tablet Take 400 mg by mouth daily.  08/24/16   [provider]  methocarbamol (ROBAXIN) 500 MG tablet Take 500 mg  by mouth 2 (two) times daily. 12/27/20   [provider]  metoprolol tartrate (LOPRESSOR) 25 MG tablet Take 1 tablet (25 mg total) by mouth 2 (two) times daily. 03/12/21 06/10/21  Osvaldo Shipper, MD  omeprazole (PRILOSEC) 40 MG capsule Take 40 mg by mouth daily. 04/16/19   [provider]  predniSONE (DELTASONE) 5 MG tablet Take 5 mg by mouth daily. 12/25/20   [provider]  QUEtiapine (SEROQUEL) 25 MG tablet Take 25 mg by mouth at bedtime.  10/01/16   [provider]  rosuvastatin (CRESTOR) 10 MG tablet Take 10 mg by mouth at bedtime. 10/23/20   [provider]  vitamin B-12 100 MCG tablet Take 1 tablet (100 mcg total) by mouth daily. 01/12/21 04/12/21  Gillis Santa, MD  Vitamin D, Ergocalciferol, (DRISDOL) 1.25 MG (50000 UNIT) CAPS capsule Take 1 capsule (50,000 Units total) by mouth every 7 (seven) days. 01/17/21 04/17/21  Gillis Santa, MD    Scheduled Meds:  apixaban  5 mg Per Tube BID   budesonide (PULMICORT) nebulizer solution  0.25 mg Nebulization BID   chlorhexidine  15 mL Mouth Rinse BID   Chlorhexidine Gluconate Cloth  6 each Topical Q0600   diazepam  5 mg Per Tube Q6H   docusate  100 mg Per Tube BID   free water  60 mL Per Tube Q4H   gabapentin  300 mg Per Tube TID   ipratropium-albuterol  3 mL Nebulization Q6H   iron polysaccharides  150 mg Per Tube Daily   magnesium oxide  400 mg Per Tube Daily   mouth rinse  15 mL Mouth Rinse q12n4p   methocarbamol  500 mg Per Tube BID   methylPREDNISolone (SOLU-MEDROL) injection  20 mg Intravenous Q24H   metoprolol tartrate  25 mg Per Tube BID   pantoprazole (PROTONIX) IV  40 mg Intravenous Q12H   polyethylene glycol  17 g Per Tube Daily   QUEtiapine  25 mg Per Tube QHS   rosuvastatin  10 mg Per Tube QHS   cyanocobalamin  100 mcg Per Tube Daily   Continuous Infusions:  sodium chloride     cefTAZidime (FORTAZ)  IV 2 g (03/26/21 0513)   feeding supplement (VITAL AF 1.2 CAL) 45 mL/hr at 03/25/21  1900   PRN Meds:.diazepam, docusate, ipratropium-albuterol, metoprolol tartrate, morphine injection, white petrolatum  Critical care time: 35 minutes       Emily Filbert, PA-S [place] DPAS   Critical care provider statement:   Total critical care time: 33 minutes   Performed by: Karna Christmas MD   Critical care time was exclusive of separately billable procedures and treating other patients.   Critical care was necessary to treat or prevent imminent or life-threatening deterioration.   Critical care was time spent personally by me on the following activities: development of treatment plan with patient and/or surrogate as well as nursing, discussions with consultants, evaluation of patient's response to treatment, examination of patient, obtaining history from patient or surrogate, ordering and performing treatments and interventions, ordering and review of laboratory  studies, ordering and review of radiographic studies, pulse oximetry and re-evaluation of patient's condition.    Vida Rigger, M.D.  Pulmonary & Critical Care Medicine

## 2021-03-26 NOTE — Evaluation (Signed)
Passy-Muir Speaking Valve - Evaluation Patient Details  Name: Virginia [name] MRN: [id number] Date of Birth: [birth year]  Today's Date: 03/26/2021 Time: 0160-1093 SLP Time Calculation (min) (ACUTE ONLY): 55 min  Past Medical History:  Past Medical History:  Diagnosis Date   Anemia    Anxiety    Arthritis    Atrial fibrillation (HCC)    C. difficile colitis 09/2015   COPD (chronic obstructive pulmonary disease) (HCC)    Depression    Dyspnea    Dysrhythmia    Fibromyalgia    H/O tracheostomy    Hep C w/ coma, chronic    Hypertension    MRSA pneumonia (HCC) 2017   On home oxygen therapy    3 L/M    Osteoporosis    Peripheral neuropathy    RLS (restless legs syndrome)    S/P percutaneous endoscopic gastrostomy (PEG) tube placement (HCC) 09/2015   Ventilator associated pneumonia (HCC) 10/2015   [facility], [place]   Past Surgical History:  Past Surgical History:  Procedure Laterality Date   ABDOMINAL HYSTERECTOMY     BREAST SURGERY Bilateral    Breast Implants   DILATION AND CURETTAGE OF UTERUS     PEG PLACEMENT N/A 06/21/2020   Procedure: PERCUTANEOUS ENDOSCOPIC GASTROSTOMY (PEG) PLACEMENT;  Surgeon: Regis Bill, MD;  Location: [facility] ENDOSCOPY;  Service: Endoscopy;  Laterality: N/A;   REVERSE SHOULDER ARTHROPLASTY Right 03/06/2017   Procedure: REVERSE SHOULDER ARTHROPLASTY;  Surgeon: Christena Flake, MD;  Location: [facility] ORS;  Service: Orthopedics;  Laterality: Right;   ROTATOR CUFF REPAIR Bilateral    TRACHEOSTOMY TUBE PLACEMENT N/A 06/15/2020   Procedure: TRACHEOSTOMY;  Surgeon: Geanie Logan, MD;  Location: [facility] ORS;  Service: ENT;  Laterality: N/A;   HPI:  Pt is a 70 year old female presented to [facility] ER via EMS from home on 09/27 due to unresponsiveness.  Per ER notes EMS reported pts initial GCS was 3 and O2 sats were unreadable.  EMS provided bag ventilation via tracheostomy en route to the ER.  Pt also received 2 mg of narcan and initially became  responsive.  Pts son informed EMS on the scene pts code status DNR, however there was no DNR paperwork readily available on the scene.  Pt recently hospitalized at [facility] from 08/28~09/19 following treatment of acute on chronic hypoxic hypercapnic respiratory failure secondary to AECOPD and enterobacter aerogenes bronchitis.   ED course  Upon arrival to the ER pt unresponsive to verbal/painful stimulation with O2 sats initially in the mid 90's on RA.  However, pt immediately became hypoxic with O2 sats decreasing to 60% with shallow respirations on RA, requiring bag ventilation via tracheostomy.  Due to continued hypoxia pts #6 cuffless tracheostomy was changed to a #6 cuffed trach, and pt placed on mechanical ventilation via trach with O2 sats increasing to 92%.  Upon removal of cuffless trach RT noted pts inner canula was partially occluded by dried secretions. The patient normally uses the trilogy as a CPAP mask with the tracheostomy capped overnight and is on 4.5 to 5 L nasal cannula during the day.  Pt has had trouble w/ her trach being "clogged before" per report.  Imaging at admit: Trace bilateral pleural effusions.    Assessment / Plan / Recommendation  Clinical Impression  Pt seen for clinical assessment of Passy-Muir Valve use/toleration for verbal communication today. Pt has a Chronic Tracheostomy and has used the PMV at home previously per chart notes. Pt has tolerated TC wean today per RT for ~2-3  hours. Pt was alert, communicative during session; suspect mild Cognitive impairment, easily distracted and required verbal cues for follow through w/ tasks.    Explained the use and wear of the PMV to pt; trach and stoma area inspected; Shiley Cuffed trach #6. Pt's respiratory effort appeared min increased w/ any exertion including Talking and Moving to sit upright in bed. RR: 20-25, O2 sats 96-99%, HR 80-93. FiO2 35% on 8L per chart. Cuff deflated at baseline. Pt's PMV placed w/out difficulty or  distress, and pt was already verbalizing w/ others. Pt verbalized indicating her requests for some "coffee" but settled w/ ice chips from SLP. Verbalizations and conversation were c/b grossly adequate volume and intelligibility w/ fair breath support/effort, though gravely, min strained vocal quality present. This is Baseline for pt. Pt also coughed to clear phlegm which increased WOB. Encouraged pt to focus on slowing down during conversation and using her breath support to support her speech/volume; take rest breaks for calming her breathing. Pt endorsed the her anxiety "could make it worse sometimes". PMV placement was tolerated during sessions w/out noted O2 desaturation, or significant change in RR/HR from his baseline. No overt discomfort noted in her respiratory effort -- pt stated it felt "fine" to wear/talk w/. No increased effort noted in respirations during the expiratory phase; no overt use of accessary muscles or distress was noted in his breathing pattern. PMV was allowed to remain on as pt consumed po ice chips. Pt stated her throat was sore; this was relayed to her Nurse.   Pt appears to adequately tolerate PMV placement w/out overt, gross respiratory discomfort or distress; ANS remained adequately stable at her Baseline during wear/use. Suspect Shiley trach size, #6, is beneficial for comfortable PMV wear/use.  Much education was given on PMV use/wear, MUST have Cuff deflation for PMV wear, checking and removing the air from the balloon b/f placing, placing/removing the PMV, and care of the PMV. Discussed that is MUST be worn for all eating/drinking; and can be work w/ Therapies(OT, PT). Must NOT be worn when sleeping. Encouraged Rest Breaks at times during the day. Precautions posted at bedside and in chart.  Pt remains w/ a Cuffed trach during her wean at this time -  unsure if pt will transition to a CUFFLESS trach for D/C. Sticker placed on Cuff line, and in room. NSG made aware. MD updated.  ST services will continue to monitor for any further needs while admitted. SLP Visit Diagnosis: Aphonia (R49.1) (tracheostomy)    SLP Assessment  Patient needs continued Speech Lanaguage Pathology Services    Recommendations for follow up therapy are one component of a multi-disciplinary discharge planning process, led by the attending physician.  Recommendations may be updated based on patient status, additional functional criteria and insurance authorization.  Follow Up Recommendations  None    Frequency and Duration min 1 x/week  1 week    PMSV Trial PMSV was placed for: at baseline since this AM per RT Able to redirect subglottic air through upper airway: Yes Able to Attain Phonation: Yes Voice Quality: Normal (min gravely - baseline) Able to Expectorate Secretions: Yes Level of Secretion Expectoration with PMSV: Oral Breath Support for Phonation: Adequate Intelligibility: Intelligible Respirations During Trial: 21 SpO2 During Trial: 97 % Pulse During Trial: 89 Behavior: Alert;Cooperative;Expresses self well;Good eye contact;Responsive to questions   Tracheostomy Tube  Additional Tracheostomy Tube Assessment Trach Collar Period: ~2-3 hours today so far Secretion Description: min Frequency of Tracheal Suctioning: prn Level of Secretion Expectoration:  Tracheal    Vent Dependency  Vent Dependent: No (nocturnal)    Cuff Deflation Trial Tolerated Cuff Deflation: Yes Length of Time for Cuff Deflation Trial: baseline when off vent Behavior: Alert;Cooperative;Expresses self well;Good eye contact;Smiling;Responsive to questions Cuff Deflation Trial - Comments: n/a    GO         Virginia [name] 03/26/2021, 2:30 PM Jerilynn Som, MS, CCC-SLP Speech Language Pathologist Rehab Services [phone]

## 2021-03-26 NOTE — TOC Progression Note (Addendum)
Transition of Care [facility]) - Progression Note    Patient Details  Name: Virginia [name] MRN: [id number] Date of Birth: 03-22-1951  Transition of Care [facility]) CM/SW Contact  Marina Goodell Phone Number: [phone] 03/26/2021, 1:27 PM  Clinical Narrative:     CSW spoke with Destiny at [facility] and they will not accept a patient who needs a vent.  CSW spoke with Alcario Drought at [facility] in [place], [place] she stated she has vent beds available.  CSW faxed FL2, H&P, PT/OT notes, respiratory notes to [phone].  Alcario Drought stated she will look at patient notes and contact CSW.       Expected Discharge Plan and Services                                                 Social Determinants of Health (SDOH) Interventions    Readmission Risk Interventions No flowsheet data found.

## 2021-03-26 NOTE — Progress Notes (Signed)
Occupational Therapy Treatment Patient Details Name: Virginia [name] MRN: [id number] DOB: October 16, 1950 Today's Date: 03/26/2021   History of present illness This is a 70 yo female with end stage COPD and chronic tracheostomy who presented to [facility] ER via EMS from home on 09/27 due to unresponsiveness. Patient recently discharged   OT comments  Upon entering the room, pt having just finished with SLP. She is tolerating PMSV well during session but reports not feeling well today. She is still motivated to participate in session. Pt uses CHG wipes to cleanse UB and portion of LB with assistance from therapist to manage lines/leads. Pt rolls with close supervision and min cuing for therapist to assist with washing back and buttocks. Bed linens changed as well with pt performing several rolls L <> R with min A secondary to fatigue. All needs within reach and vitals WNLs. Pt resting as therapist exits the room.    Recommendations for follow up therapy are one component of a multi-disciplinary discharge planning process, led by the attending physician.  Recommendations may be updated based on patient status, additional functional criteria and insurance authorization.    Follow Up Recommendations  Supervision/Assistance - 24 hour;SNF    Equipment Recommendations  None recommended by OT       Precautions / Restrictions Precautions Precautions: Fall Precaution Comments: trach with vent support PRN Restrictions Weight Bearing Restrictions: No (Simultaneous filing. User may not have seen previous data.)       Mobility Bed Mobility Overal bed mobility: Needs Assistance Bed Mobility: Rolling Rolling: Supervision;Min assist   Supine to sit: Min assist;HOB elevated Sit to supine: Min guard;HOB elevated   General bed mobility comments: occasional assistance for trunk support. verbal cues for safety and technique    Transfers Overall transfer level: Needs assistance Equipment used:  None Transfers: Sit to/from Stand Sit to Stand: Min guard         General transfer comment: Min guard for safety. increased time and effort required. no significant change in vitals noted with activity    Balance Overall balance assessment: Needs assistance Sitting-balance support: Feet supported Sitting balance-Leahy Scale: Good     Standing balance support: Single extremity supported Standing balance-Leahy Scale: Fair Standing balance comment: no loss of balance noted. Min guard/close stand by assistance for safety                           ADL either performed or assessed with clinical judgement   ADL Overall ADL's : Needs assistance/impaired         Upper Body Bathing: Set up   Lower Body Bathing: Minimal assistance;Bed level   Upper Body Dressing : Minimal assistance Upper Body Dressing Details (indicate cue type and reason): min A secondary to lines/leads to don hospital gown                         Vision Patient Visual Report: No change from baseline            Cognition Arousal/Alertness: Awake/alert Behavior During Therapy: WFL for tasks assessed/performed Overall Cognitive Status: Within Functional Limits for tasks assessed                                          Exercises Exercises: General Lower Extremity;Other exercises General Exercises - Lower Extremity Long Arc Quad:  AROM;Strengthening;Both;10 reps;Seated Other Exercises Other Exercises: verbal cues for exercise technique for strengthening      General Comments patient on trach with vent suppor at PEEP 5 and Fi02 28% during session. heart rate 90's with activity, Sp02 99%, blood pressure 117/81. patient had bowel movement on bed pan during session.    Pertinent Vitals/ Pain       Pain Assessment: No/denies pain      Frequency  Min 2X/week        Progress Toward Goals  OT Goals(current goals can now be found in the care plan section)   Progress towards OT goals: Progressing toward goals  Acute Rehab OT Goals Patient Stated Goal: to go home OT Goal Formulation: With patient Time For Goal Achievement: 04/05/21 Potential to Achieve Goals: Good  Plan Frequency remains appropriate;Discharge plan needs to be updated       AM-PAC OT "6 Clicks" Daily Activity     Outcome Measure   Help from another person eating meals?: None Help from another person taking care of personal grooming?: None Help from another person toileting, which includes using toliet, bedpan, or urinal?: A Little Help from another person bathing (including washing, rinsing, drying)?: A Little Help from another person to put on and taking off regular upper body clothing?: A Little Help from another person to put on and taking off regular lower body clothing?: A Little 6 Click Score: 20    End of Session Equipment Utilized During Treatment: Oxygen  OT Visit Diagnosis: Unsteadiness on feet (R26.81);Repeated falls (R29.6);Muscle weakness (generalized) (M62.81);History of falling (Z91.81)   Activity Tolerance Patient tolerated treatment well   Patient Left in bed;with call bell/phone within reach;with bed alarm set   Nurse Communication Mobility status        Time: 1050-1110 OT Time Calculation (min): 20 min  Charges: OT General Charges $OT Visit: 1 Visit OT Treatments $Self Care/Home Management : 8-22 mins  Jackquline Denmark, MS, OTR/L , CBIS ascom [phone]  03/26/21, 11:29 AM

## 2021-03-26 NOTE — Progress Notes (Signed)
Pt requiring mechanical ventilation via chronic tracheostomy qhs due to severe COPD with following ventilator settings: PRVC rate 14, tidal volume 450, PEEP 5, and FiO2 28%.  Camie Patience, AGNP  Pulmonary/Critical Care Pager [phone] (please enter 7 digits) PCCM Consult Pager [phone] (please enter 7 digits)

## 2021-03-26 NOTE — Progress Notes (Signed)
PHARMACY CONSULT NOTE - FOLLOW UP  Pharmacy Consult for Electrolyte Monitoring and Replacement   Recent Labs: Potassium (mmol/L)  Date Value  03/26/2021 3.6   Magnesium (mg/dL)  Date Value  99/35/7017 2.0   Calcium (mg/dL)  Date Value  79/39/0300 8.3 (L)   Albumin (g/dL)  Date Value  92/33/0076 2.8 (L)   Phosphorus (mg/dL)  Date Value  22/63/3354 3.4   Sodium (mmol/L)  Date Value  03/26/2021 137     Assessment: 70 year old female presented to the ED unresponsive. Patient required mechanical ventilation via chronic trach. Respiratory failure s/t end stage COPD. Patient with dark tarry stools that were hemoccult positive in the ED, concerning for GIB. Pharmacy consult to manage electrolytes.  Started Mg oxide 400 mg daily.  On free water 60 ml q4H.   Goal of Therapy:  Electrolytes WNL  Plan:  No replacement at this time.  Continue to follow  Pricilla Riffle, PharmD, BCPS Clinical Pharmacist 03/26/2021 1:24 PM

## 2021-03-27 ENCOUNTER — Encounter: Payer: Self-pay | Admitting: Oncology

## 2021-03-27 LAB — CBC WITH DIFFERENTIAL/PLATELET
Abs Immature Granulocytes: 0.16 10*3/uL — ABNORMAL HIGH (ref 0.00–0.07)
Basophils Absolute: 0 10*3/uL (ref 0.0–0.1)
Basophils Relative: 0 %
Eosinophils Absolute: 0.2 10*3/uL (ref 0.0–0.5)
Eosinophils Relative: 1 %
HCT: 25.4 % — ABNORMAL LOW (ref 36.0–46.0)
Hemoglobin: 8 g/dL — ABNORMAL LOW (ref 12.0–15.0)
Immature Granulocytes: 1 %
Lymphocytes Relative: 15 %
Lymphs Abs: 1.8 10*3/uL (ref 0.7–4.0)
MCH: 30.3 pg (ref 26.0–34.0)
MCHC: 31.5 g/dL (ref 30.0–36.0)
MCV: 96.2 fL (ref 80.0–100.0)
Monocytes Absolute: 1.3 10*3/uL — ABNORMAL HIGH (ref 0.1–1.0)
Monocytes Relative: 11 %
Neutro Abs: 8.6 10*3/uL — ABNORMAL HIGH (ref 1.7–7.7)
Neutrophils Relative %: 72 %
Platelets: 384 10*3/uL (ref 150–400)
RBC: 2.64 MIL/uL — ABNORMAL LOW (ref 3.87–5.11)
RDW: 16.6 % — ABNORMAL HIGH (ref 11.5–15.5)
WBC: 12 10*3/uL — ABNORMAL HIGH (ref 4.0–10.5)
nRBC: 0 % (ref 0.0–0.2)

## 2021-03-27 LAB — BASIC METABOLIC PANEL
Anion gap: 7 (ref 5–15)
BUN: 20 mg/dL (ref 8–23)
CO2: 34 mmol/L — ABNORMAL HIGH (ref 22–32)
Calcium: 8.6 mg/dL — ABNORMAL LOW (ref 8.9–10.3)
Chloride: 96 mmol/L — ABNORMAL LOW (ref 98–111)
Creatinine, Ser: 0.51 mg/dL (ref 0.44–1.00)
GFR, Estimated: >60 mL/min (ref >60)
Glucose, Bld: 99 mg/dL (ref 70–99)
Potassium: 3.6 mmol/L (ref 3.5–5.1)
Sodium: 137 mmol/L (ref 135–145)

## 2021-03-27 LAB — PHOSPHORUS: Phosphorus: 3.6 mg/dL (ref 2.5–4.6)

## 2021-03-27 LAB — MAGNESIUM: Magnesium: 1.9 mg/dL (ref 1.7–2.4)

## 2021-03-27 MED ORDER — PHENOL 1.4 % MT LIQD
1.0000 | OROMUCOSAL | Status: DC | PRN
  Filled 2021-03-27: qty 177

## 2021-03-27 NOTE — Progress Notes (Signed)
PHARMACY CONSULT NOTE - FOLLOW UP  Pharmacy Consult for Electrolyte Monitoring and Replacement   Recent Labs: Potassium (mmol/L)  Date Value  03/27/2021 3.6   Magnesium (mg/dL)  Date Value  75/17/0017 1.9   Calcium (mg/dL)  Date Value  49/44/9675 8.6 (L)   Albumin (g/dL)  Date Value  91/63/8466 2.8 (L)   Phosphorus (mg/dL)  Date Value  59/93/5701 3.6   Sodium (mmol/L)  Date Value  03/27/2021 137     Assessment: 70 year old female presented to the ED unresponsive. Patient required mechanical ventilation via chronic trach. Respiratory failure s/t end stage COPD. Patient with dark tarry stools that were hemoccult positive in the ED, concerning for GIB. Pharmacy consult to manage electrolytes.  Started Mg oxide 400 mg daily.  On free water 60 ml q4H.   Goal of Therapy:  Electrolytes WNL  Plan:  No replacement at this time.  Continue to follow  Pricilla Riffle, PharmD, BCPS Clinical Pharmacist 03/27/2021 12:11 PM

## 2021-03-27 NOTE — Progress Notes (Signed)
Physical Therapy Treatment Patient Details Name: Virginia [name] MRN: [id number] DOB: 12-19-1950 Today's Date: 03/27/2021   History of Present Illness This is a 70 yo female with end stage COPD and chronic tracheostomy who presented to [facility] ER via EMS from home on 09/27 due to unresponsiveness. Patient recently discharged    PT Comments    Patient agreeable to PT. She reports she has had a bad day with anxiety and diarrhea. She is agreeable to bed level activity and placed her PMSV for the session. Supervision for rolling in bed for bed pan placement with safety cues.  Patient had loose bowel movement and assistance required for hygiene. Patient participated with LE therapeutic exercises in bed and encouraged patient to perform throughout the day. No significant change in vitals noted with activity.  PT will continue to follow to maximize independence and facilitate return to prior level of function.    Recommendations for follow up therapy are one component of a multi-disciplinary discharge planning process, led by the attending physician.  Recommendations may be updated based on patient status, additional functional criteria and insurance authorization.  Follow Up Recommendations  SNF     Equipment Recommendations   (to be determined at next level of care)    Recommendations for Other Services       Precautions / Restrictions Precautions Precautions: Fall Precaution Comments: trach Restrictions Weight Bearing Restrictions: No     Mobility  Bed Mobility Overal bed mobility: Needs Assistance Bed Mobility: Rolling Rolling: Supervision         General bed mobility comments: supervision for rolling to right to remove bed pan. heavy use of bed rails. patient was able to bridge independently to place bed pan. patient had liquid stool. patient declined sitting up on edge of bed at this session. patient also can long sit independently in bed    Transfers                     Ambulation/Gait                 Stairs             Wheelchair Mobility    Modified Rankin (Stroke Patients Only)       Balance                                            Cognition Arousal/Alertness: Awake/alert Behavior During Therapy: WFL for tasks assessed/performed Overall Cognitive Status: Within Functional Limits for tasks assessed                                 General Comments: trach collar with Fi02 28%. patient placed PMSV during the session      Exercises General Exercises - Lower Extremity Ankle Circles/Pumps: AROM;Strengthening;Both;10 reps;Supine Heel Slides: AAROM;Strengthening;Both;10 reps;Supine Hip ABduction/ADduction: Strengthening;Both;10 reps;Supine;AROM Straight Leg Raises: AROM;Strengthening;Both;10 reps;Supine Other Exercises Other Exercises: verbal cues for exercise technique for strenghening LE. encouraged patient to perform throughout the day. no significant change in vitals noted with activity. patient asked for ice chips at end of session. provided patient with several ice chips with nurse permission    General Comments        Pertinent Vitals/Pain Pain Assessment: Faces Pain Score: 2  Faces Pain Scale: Hurts a little bit Pain Location: throat Pain  Descriptors / Indicators: Sore Pain Intervention(s): Limited activity within patient's tolerance    Home Living                      Prior Function            PT Goals (current goals can now be found in the care plan section) Acute Rehab PT Goals Patient Stated Goal: to go home PT Goal Formulation: With patient Time For Goal Achievement: 04/05/21 Potential to Achieve Goals: Good Progress towards PT goals: Progressing toward goals    Frequency    Min 2X/week      PT Plan Current plan remains appropriate    Co-evaluation              AM-PAC PT "6 Clicks" Mobility   Outcome Measure  Help needed turning from  your back to your side while in a flat bed without using bedrails?: A Little Help needed moving from lying on your back to sitting on the side of a flat bed without using bedrails?: A Little Help needed moving to and from a bed to a chair (including a wheelchair)?: A Lot Help needed standing up from a chair using your arms (e.g., wheelchair or bedside chair)?: A Lot Help needed to walk in hospital room?: A Lot Help needed climbing 3-5 steps with a railing? : A Lot 6 Click Score: 14    End of Session Equipment Utilized During Treatment: Oxygen (trach collar) Activity Tolerance: Patient limited by fatigue Patient left: in bed;with call bell/phone within reach Nurse Communication: Mobility status PT Visit Diagnosis: Difficulty in walking, not elsewhere classified (R26.2);Other abnormalities of gait and mobility (R26.89);Dizziness and giddiness (R42)     Time: 2423-5361 PT Time Calculation (min) (ACUTE ONLY): 24 min  Charges:  $Therapeutic Exercise: 8-22 mins $Therapeutic Activity: 8-22 mins                     [name], PT, MPT    [name] 03/27/2021, 1:06 PM

## 2021-03-27 NOTE — TOC Progression Note (Signed)
Transition of Care [facility]) - Progression Note    Patient Details  Name: Virginia [name] MRN: [id number] Date of Birth: [birth year]  [facility]) CM/SW Contact  Marina Goodell Phone Number: [phone] 03/27/2021, 4:10 PM  Clinical Narrative:     CSW updated patient's daughter in law [name] [phone], on possible SNF placement at [facility] in [place], [place]. CSW explained there are a limited number of SNF facilities which will take vent patients. CSW updated Ms. Walker, and dicussed SNF placement and long term placement .  Ms. Dan Humphreys stated the patient has agreed to SNF placement and is considering long-term care.  CSW stated I would update Ms. Walker once I received an update from the facility.  Ms. Dan Humphreys verbalized understanding.  CSW contact UNIT RRT, requesting vent flow sheets for the lat 72 hours to fax to the facility. RRT stated she would print the information available and have then ready for rounds tomorrow morning, 03/28/2021.        Expected Discharge Plan and Services                                                 Social Determinants of Health (SDOH) Interventions    Readmission Risk Interventions No flowsheet data found.

## 2021-03-27 NOTE — Consult Note (Signed)
[facility] Face-to-Face Psychiatry Consult   Reason for Consult: Follow-up consult 70 year old woman with respiratory problems currently in the intensive care unit.  Consult yesterday for anxiety Referring Physician:  Karna Christmas Patient Identification: Virginia [name] MRN:  [id number] Principal Diagnosis: Generalized anxiety disorder Diagnosis:  Principal Problem:   Generalized anxiety disorder Active Problems:   Acute on chronic respiratory failure with hypoxemia (HCC)   Total Time spent with patient: 20 minutes  Subjective:   Virginia [name] is a 70 y.o. female patient admitted with "I just had a spell".  HPI: Patient seen chart reviewed.  When we came by to see the patient she said she had just gone through an episode of respiratory distress in which the tube came loose from her tracheostomy area.  She was showing some effort breathing but said that she was recovering.  Overall she feels like her anxiety is a bit better and improved from yesterday.  Says that she slept okay.  No worsening of symptoms.  Past Psychiatric History: Past history of longstanding chronic anxiety treated with benzodiazepines by outpatient provider  Risk to Self:   Risk to Others:   Prior Inpatient Therapy:   Prior Outpatient Therapy:    Past Medical History:  Past Medical History:  Diagnosis Date   Anemia    Anxiety    Arthritis    Atrial fibrillation (HCC)    C. difficile colitis 09/2015   COPD (chronic obstructive pulmonary disease) (HCC)    Depression    Dyspnea    Dysrhythmia    Fibromyalgia    H/O tracheostomy    Hep C w/ coma, chronic    Hypertension    MRSA pneumonia (HCC) 2017   On home oxygen therapy    3 L/M    Osteoporosis    Peripheral neuropathy    RLS (restless legs syndrome)    S/P percutaneous endoscopic gastrostomy (PEG) tube placement (HCC) 09/2015   Ventilator associated pneumonia (HCC) 10/2015   [facility], [place]    Past Surgical History:  Procedure Laterality  Date   ABDOMINAL HYSTERECTOMY     BREAST SURGERY Bilateral    Breast Implants   DILATION AND CURETTAGE OF UTERUS     PEG PLACEMENT N/A 06/21/2020   Procedure: PERCUTANEOUS ENDOSCOPIC GASTROSTOMY (PEG) PLACEMENT;  Surgeon: Regis Bill, MD;  Location: [facility];  Service: Endoscopy;  Laterality: N/A;   REVERSE SHOULDER ARTHROPLASTY Right 03/06/2017   Procedure: REVERSE SHOULDER ARTHROPLASTY;  Surgeon: Christena Flake, MD;  Location: [facility] ORS;  Service: Orthopedics;  Laterality: Right;   ROTATOR CUFF REPAIR Bilateral    TRACHEOSTOMY TUBE PLACEMENT N/A 06/15/2020   Procedure: TRACHEOSTOMY;  Surgeon: Geanie Logan, MD;  Location: [facility] ORS;  Service: ENT;  Laterality: N/A;   Family History:  Family History  Problem Relation Age of Onset   Hypertension Mother    Family Psychiatric  History: See previous Social History:  Social History   Substance and Sexual Activity  Alcohol Use No     Social History   Substance and Sexual Activity  Drug Use No    Social History   Socioeconomic History   Marital status: Single    Spouse name: Not on file   Number of children: Not on file   Years of education: Not on file   Highest education level: Not on file  Occupational History   Not on file  Tobacco Use   Smoking status: Former    Packs/day: 1.50    Types: Cigarettes  Quit date: 10/05/2015    Years since quitting: 5.4   Smokeless tobacco: Never  Vaping Use   Vaping Use: Some days   Last attempt to quit: 10/05/2015  Substance and Sexual Activity   Alcohol use: No   Drug use: No   Sexual activity: Not on file  Other Topics Concern   Not on file  Social History Narrative   Not on file   Social Determinants of Health   Financial Resource Strain: Not on file  Food Insecurity: Not on file  Transportation Needs: Not on file  Physical Activity: Not on file  Stress: Not on file  Social Connections: Not on file   Additional Social History:    Allergies:  No Known  Allergies  Labs:  Results for orders placed or performed during the hospital encounter of 03/20/21 (from the past 48 hour(s))  Glucose, capillary     Status: Abnormal   Collection Time: 03/25/21  4:16 PM  Result Value Ref Range   Glucose-Capillary 144 (H) 70 - 99 mg/dL    Comment: Glucose reference range applies only to samples taken after fasting for at least 8 hours.  Glucose, capillary     Status: Abnormal   Collection Time: 03/25/21  7:41 PM  Result Value Ref Range   Glucose-Capillary 112 (H) 70 - 99 mg/dL    Comment: Glucose reference range applies only to samples taken after fasting for at least 8 hours.  Glucose, capillary     Status: Abnormal   Collection Time: 03/25/21 11:53 PM  Result Value Ref Range   Glucose-Capillary 101 (H) 70 - 99 mg/dL    Comment: Glucose reference range applies only to samples taken after fasting for at least 8 hours.  Glucose, capillary     Status: None   Collection Time: 03/26/21  3:15 AM  Result Value Ref Range   Glucose-Capillary 94 70 - 99 mg/dL    Comment: Glucose reference range applies only to samples taken after fasting for at least 8 hours.  Magnesium     Status: None   Collection Time: 03/26/21  5:23 AM  Result Value Ref Range   Magnesium 2.0 1.7 - 2.4 mg/dL    Comment: Performed at [facility], [address] [place], [place] [address]  Phosphorus     Status: None   Collection Time: 03/26/21  5:23 AM  Result Value Ref Range   Phosphorus 3.4 2.5 - 4.6 mg/dL    Comment: Performed at [facility], [address] [place], [place] [address]  Basic metabolic panel     Status: Abnormal   Collection Time: 03/26/21  5:23 AM  Result Value Ref Range   Sodium 137 135 - 145 mmol/L   Potassium 3.6 3.5 - 5.1 mmol/L   Chloride 96 (L) 98 - 111 mmol/L   CO2 35 (H) 22 - 32 mmol/L   Glucose, Bld 102 (H) 70 - 99 mg/dL    Comment: Glucose reference range applies only to samples taken after fasting for at least 8 hours.   BUN 20  8 - 23 mg/dL   Creatinine, Ser 1.85 0.44 - 1.00 mg/dL   Calcium 8.3 (L) 8.9 - 10.3 mg/dL   GFR, Estimated >63 >14 mL/min    Comment: (NOTE) Calculated using the CKD-EPI Creatinine Equation (2021)    Anion gap 6 5 - 15    Comment: Performed at [facility], [address] [place], [place] [address]  CBC     Status:  Abnormal   Collection Time: 03/26/21  5:23 AM  Result Value Ref Range   WBC 10.6 (H) 4.0 - 10.5 K/uL   RBC 2.71 (L) 3.87 - 5.11 MIL/uL   Hemoglobin 8.2 (L) 12.0 - 15.0 g/dL   HCT 17.5 (L) 10.2 - 58.5 %   MCV 96.7 80.0 - 100.0 fL   MCH 30.3 26.0 - 34.0 pg   MCHC 31.3 30.0 - 36.0 g/dL   RDW 27.7 (H) 82.4 - 23.5 %   Platelets 388 150 - 400 K/uL   nRBC 0.2 0.0 - 0.2 %    Comment: Performed at [facility], [address] [place], [place] [address]  Glucose, capillary     Status: Abnormal   Collection Time: 03/26/21  7:26 AM  Result Value Ref Range   Glucose-Capillary 105 (H) 70 - 99 mg/dL    Comment: Glucose reference range applies only to samples taken after fasting for at least 8 hours.  Glucose, capillary     Status: Abnormal   Collection Time: 03/26/21 11:34 AM  Result Value Ref Range   Glucose-Capillary 119 (H) 70 - 99 mg/dL    Comment: Glucose reference range applies only to samples taken after fasting for at least 8 hours.  Glucose, capillary     Status: Abnormal   Collection Time: 03/26/21  3:32 PM  Result Value Ref Range   Glucose-Capillary 158 (H) 70 - 99 mg/dL    Comment: Glucose reference range applies only to samples taken after fasting for at least 8 hours.  Glucose, capillary     Status: Abnormal   Collection Time: 03/26/21  7:25 PM  Result Value Ref Range   Glucose-Capillary 116 (H) 70 - 99 mg/dL    Comment: Glucose reference range applies only to samples taken after fasting for at least 8 hours.  Glucose, capillary     Status: None   Collection Time: 03/26/21 11:18 PM  Result Value Ref Range   Glucose-Capillary 96 70 - 99  mg/dL    Comment: Glucose reference range applies only to samples taken after fasting for at least 8 hours.   Comment 1 Notify RN    Comment 2 Document in Chart   CBC with Differential/Platelet     Status: Abnormal   Collection Time: 03/27/21  6:08 AM  Result Value Ref Range   WBC 12.0 (H) 4.0 - 10.5 K/uL   RBC 2.64 (L) 3.87 - 5.11 MIL/uL   Hemoglobin 8.0 (L) 12.0 - 15.0 g/dL   HCT 31.5 (L) 40.0 - 86.7 %   MCV 96.2 80.0 - 100.0 fL   MCH 30.3 26.0 - 34.0 pg   MCHC 31.5 30.0 - 36.0 g/dL   RDW 61.9 (H) 50.9 - 32.6 %   Platelets 384 150 - 400 K/uL   nRBC 0.0 0.0 - 0.2 %   Neutrophils Relative % 72 %   Neutro Abs 8.6 (H) 1.7 - 7.7 K/uL   Lymphocytes Relative 15 %   Lymphs Abs 1.8 0.7 - 4.0 K/uL   Monocytes Relative 11 %   Monocytes Absolute 1.3 (H) 0.1 - 1.0 K/uL   Eosinophils Relative 1 %   Eosinophils Absolute 0.2 0.0 - 0.5 K/uL   Basophils Relative 0 %   Basophils Absolute 0.0 0.0 - 0.1 K/uL   Immature Granulocytes 1 %   Abs Immature Granulocytes 0.16 (H) 0.00 - 0.07 K/uL    Comment: Performed at [facility], [address], [place], [place] [address]  Basic metabolic  panel     Status: Abnormal   Collection Time: 03/27/21  6:08 AM  Result Value Ref Range   Sodium 137 135 - 145 mmol/L   Potassium 3.6 3.5 - 5.1 mmol/L   Chloride 96 (L) 98 - 111 mmol/L   CO2 34 (H) 22 - 32 mmol/L   Glucose, Bld 99 70 - 99 mg/dL    Comment: Glucose reference range applies only to samples taken after fasting for at least 8 hours.   BUN 20 8 - 23 mg/dL   Creatinine, Ser 1.61 0.44 - 1.00 mg/dL   Calcium 8.6 (L) 8.9 - 10.3 mg/dL   GFR, Estimated >09 >60 mL/min    Comment: (NOTE) Calculated using the CKD-EPI Creatinine Equation (2021)    Anion gap 7 5 - 15    Comment: Performed at [facility], [address] [place], [place] [address]  Magnesium     Status: None   Collection Time: 03/27/21  6:08 AM  Result Value Ref Range   Magnesium 1.9 1.7 - 2.4 mg/dL    Comment:  Performed at [facility], [address] [place], [place] [address]  Phosphorus     Status: None   Collection Time: 03/27/21  6:08 AM  Result Value Ref Range   Phosphorus 3.6 2.5 - 4.6 mg/dL    Comment: Performed at [facility], [address] [place], [place] [address]    Current Facility-Administered Medications  Medication Dose Route Frequency Provider Last Rate Last Admin   0.9 %  sodium chloride infusion  250 mL Intravenous Continuous Ouma, Hubbard Hartshorn, NP       amitriptyline (ELAVIL) tablet 25 mg  25 mg Per Tube QHS Lianne Cure, NP   25 mg at 03/26/21 2141   apixaban (ELIQUIS) tablet 5 mg  5 mg Per Tube BID Erin Fulling, MD   5 mg at 03/27/21 0945   budesonide (PULMICORT) nebulizer solution 0.25 mg  0.25 mg Nebulization BID Jimmye Norman, NP   0.25 mg at 03/27/21 0739   cefTAZidime (FORTAZ) 2 g in sodium chloride 0.9 % 100 mL IVPB  2 g Intravenous Q8H Kasa, Wallis Bamberg, MD 200 mL/hr at 03/27/21 0521 2 g at 03/27/21 0521   chlorhexidine (PERIDEX) 0.12 % solution 15 mL  15 mL Mouth Rinse BID Erin Fulling, MD   15 mL at 03/27/21 0946   Chlorhexidine Gluconate Cloth 2 % PADS 6 each  6 each Topical [id number] Erin Fulling, MD   6 each at 03/26/21 1129   diazepam (VALIUM) tablet 7.5 mg  7.5 mg Per Tube Q8H Tkai Serfass T, MD   7.5 mg at 03/27/21 0528   docusate (COLACE) 50 MG/5ML liquid 100 mg  100 mg Per Tube BID Jimmye Norman, NP   100 mg at 03/26/21 2139   docusate (COLACE) 50 MG/5ML liquid 100 mg  100 mg Per Tube BID PRN Erin Fulling, MD       feeding supplement (VITAL AF 1.2 CAL) liquid 1,000 mL  1,000 mL Per Tube Continuous Erin Fulling, MD 45 mL/hr at 03/27/21 0600 Infusion Verify at 03/27/21 0600   free water 60 mL  60 mL Per Tube Q4H Erin Fulling, MD   60 mL at 03/27/21 1202   gabapentin (NEURONTIN) capsule 300 mg  300 mg Per Tube TID Erin Fulling, MD   300 mg at 03/27/21 0946   hydrOXYzine (ATARAX/VISTARIL) tablet 25 mg  25 mg Per Tube TID  PRN Lianne Cure, NP  25 mg at 03/27/21 0824   ipratropium-albuterol (DUONEB) 0.5-2.5 (3) MG/3ML nebulizer solution 3 mL  3 mL Nebulization Q6H Jimmye Norman, NP   3 mL at 03/27/21 0739   ipratropium-albuterol (DUONEB) 0.5-2.5 (3) MG/3ML nebulizer solution 3 mL  3 mL Nebulization Q6H PRN Jimmye Norman, NP       iron polysaccharides (NIFEREX) capsule 150 mg  150 mg Per Tube Daily Erin Fulling, MD   150 mg at 03/27/21 0946   lidocaine (LIDODERM) 5 % 1 patch  1 patch Transdermal Q24H Lianne Cure, NP   1 patch at 03/27/21 1202   magnesium oxide (MAG-OX) tablet 400 mg  400 mg Per Tube Daily Erin Fulling, MD   400 mg at 03/27/21 0945   MEDLINE mouth rinse  15 mL Mouth Rinse q12n4p Erin Fulling, MD   15 mL at 03/27/21 1202   methocarbamol (ROBAXIN) tablet 500 mg  500 mg Per Tube BID Erin Fulling, MD   500 mg at 03/27/21 0946   methylPREDNISolone sodium succinate (SOLU-MEDROL) 40 mg/mL injection 20 mg  20 mg Intravenous Q24H Harlon Ditty D, NP   20 mg at 03/27/21 0947   metoprolol tartrate (LOPRESSOR) injection 2.5-5 mg  2.5-5 mg Intravenous Q6H PRN Lianne Cure, NP       metoprolol tartrate (LOPRESSOR) tablet 25 mg  25 mg Per Tube BID Erin Fulling, MD   25 mg at 03/27/21 0946   pantoprazole (PROTONIX) injection 40 mg  40 mg Intravenous Q12H Jimmye Norman, NP   40 mg at 03/27/21 0947   polyethylene glycol (MIRALAX / GLYCOLAX) packet 17 g  17 g Per Tube Daily Jimmye Norman, NP   17 g at 03/26/21 1008   QUEtiapine (SEROQUEL) tablet 25 mg  25 mg Per Tube QHS Erin Fulling, MD   25 mg at 03/26/21 2138   rosuvastatin (CRESTOR) tablet 10 mg  10 mg Per Tube QHS Erin Fulling, MD   10 mg at 03/26/21 2139   vitamin B-12 (CYANOCOBALAMIN) tablet 100 mcg  100 mcg Per Tube Daily Erin Fulling, MD   100 mcg at 03/27/21 0946   white petrolatum (VASELINE) gel   Topical PRN Erin Fulling, MD        Musculoskeletal: Strength & Muscle Tone: within normal limits Gait &  Station: unable to stand Patient leans: N/A            Psychiatric Specialty Exam:  Presentation  General Appearance:  No data recorded Eye Contact: No data recorded Speech: No data recorded Speech Volume: No data recorded Handedness: No data recorded  Mood and Affect  Mood: No data recorded Affect: No data recorded  Thought Process  Thought Processes: No data recorded Descriptions of Associations:No data recorded Orientation:No data recorded Thought Content:No data recorded History of Schizophrenia/Schizoaffective disorder:No data recorded Duration of Psychotic Symptoms:No data recorded Hallucinations:No data recorded Ideas of Reference:No data recorded Suicidal Thoughts:No data recorded Homicidal Thoughts:No data recorded  Sensorium  Memory: No data recorded Judgment: No data recorded Insight: No data recorded  Executive Functions  Concentration: No data recorded Attention Span: No data recorded Recall: No data recorded Fund of Knowledge: No data recorded Language: No data recorded  Psychomotor Activity  Psychomotor Activity: No data recorded  Assets  Assets: No data recorded  Sleep  Sleep: No data recorded  Physical Exam: Physical Exam Vitals and nursing note reviewed.  Constitutional:      Appearance: Normal appearance. She is ill-appearing.  HENT:  Head: Normocephalic and atraumatic.     Mouth/Throat:     Pharynx: Oropharynx is clear.  Eyes:     Pupils: Pupils are equal, round, and reactive to light.  Cardiovascular:     Rate and Rhythm: Normal rate and regular rhythm.  Pulmonary:     Effort: Respiratory distress present.     Breath sounds: Normal breath sounds.  Abdominal:     General: Abdomen is flat.     Palpations: Abdomen is soft.  Musculoskeletal:        General: Normal range of motion.  Skin:    General: Skin is warm and dry.  Neurological:     General: No focal deficit present.     Mental Status:  She is alert. Mental status is at baseline.  Psychiatric:        Mood and Affect: Mood normal.        Thought Content: Thought content normal.   Review of Systems  Constitutional: Negative.   HENT: Negative.    Eyes: Negative.   Respiratory:  Positive for shortness of breath.   Cardiovascular: Negative.   Gastrointestinal: Negative.   Musculoskeletal: Negative.   Skin: Negative.   Neurological: Negative.   Psychiatric/Behavioral: Negative.    Blood pressure 134/86, pulse 89, temperature 98.1 F (36.7 C), temperature source Oral, resp. rate (!) 27, height 5' (1.524 m), weight 50.9 kg, SpO2 100 %. Body mass index is 21.92 kg/m.  Treatment Plan Summary: Plan patient appears to be stable with no new complaints.  Medical illness still severe.  No change to updated orders for diazepam.  Explained the order times to the patient.  We will follow as needed.  Disposition: Patient does not meet criteria for psychiatric inpatient admission.  Mordecai Rasmussen, MD 03/27/2021 1:53 PM

## 2021-03-27 NOTE — Progress Notes (Signed)
SLP Cancellation Note  Patient Details Name: Virginia [name] MRN: [id number] DOB: 1950-08-03   Cancelled treatment:       Reason Eval/Treat Not Completed: Medical issues which prohibited therapy;Patient not medically ready (chart reviewed; attempted to see pt x2 today). Attempted to see pt this AM for BSE; pt refused stating she did not feel well -- NSG reported ongoing diarrhea. This PM pt was resting/eyes closed; wanted to continue to rest.  ST services will f/u w/ BSE tomorrow. Pt currently has NGT for TFs. Recommend frequent oral care for hygiene and stimulation of swallowing. Aspiration precautions.      Jerilynn Som, MS, CCC-SLP Speech Language Pathologist Rehab Services [phone] [facility] 03/27/2021, 4:05 PM

## 2021-03-28 DIAGNOSIS — Z7189 Other specified counseling: Secondary | ICD-10-CM

## 2021-03-28 DIAGNOSIS — F411 Generalized anxiety disorder: Secondary | ICD-10-CM | POA: Diagnosis not present

## 2021-03-28 MED ORDER — SODIUM CHLORIDE 0.9% FLUSH
10.0000 mL | Freq: Two times a day (BID) | INTRAVENOUS | Status: DC
  Administered 2021-03-28 – 2021-04-01 (×9): 10 mL
  Administered 2021-04-02: 30 mL

## 2021-03-28 MED ORDER — PANTOPRAZOLE 2 MG/ML SUSPENSION
40.0000 mg | Freq: Two times a day (BID) | ORAL | Status: DC
  Administered 2021-03-28 – 2021-03-30 (×4): 40 mg
  Filled 2021-03-28 (×4): qty 20

## 2021-03-28 MED ORDER — SODIUM CHLORIDE 0.9 % IV SOLN
2.0000 g | Freq: Two times a day (BID) | INTRAVENOUS | Status: AC
  Administered 2021-03-28 – 2021-03-29 (×2): 2 g via INTRAVENOUS
  Filled 2021-03-28 (×2): qty 2

## 2021-03-28 MED ORDER — SODIUM CHLORIDE 0.9% FLUSH: 10.0000 mL | INTRAVENOUS | Status: DC | PRN

## 2021-03-28 NOTE — TOC Progression Note (Signed)
Transition of Care [facility]) - Progression Note    Patient Details  Name: Virginia [name] MRN: [id number] Date of Birth: 06/23/1951  Transition of Care [facility]) CM/SW Contact  Hetty Ely, RN Phone Number: 03/28/2021, 1:51 PM  Clinical Narrative: Respiratory flow sheets faxed to [phone] for Alcario Drought at [facility] facility to review for admission decision.         Expected Discharge Plan and Services                                                 Social Determinants of Health (SDOH) Interventions    Readmission Risk Interventions No flowsheet data found.

## 2021-03-28 NOTE — Progress Notes (Signed)
NAME:  Virginia [name], MRN:  [id number], DOB:  1951/03/31, LOS: 8 ADMISSION DATE:  03/20/2021, CONSULTATION DATE: 03/20/2021 REFERRING MD: Dr. Vicente Males, CHIEF COMPLAINT: Unresponsiveness   History of Present Illness:  This is a 70 yo female with end stage COPD and chronic tracheostomy who presented to [facility] ER via EMS from home on 09/27 due to unresponsiveness.  Per ER notes EMS reported pts initial GCS was 3 and O2 sats were unreadable.  EMS provided bag ventilation via tracheostomy en route to the ER.  Pt also received 2 mg of narcan and initially became responsive.  Pts son informed EMS on the scene pts code status DNR, however there was no DNR paperwork readily available on the scene.  Pt recently hospitalized at [facility] from 08/28~09/19 following treatment of acute on chronic hypoxic hypercapnic respiratory failure secondary to AECOPD and enterobacter aerogenes bronchitis.    ED course Upon arrival to the ER pt unresponsive to verbal/painful stimulation with O2 sats initially in the mid 90's on RA.  However, pt immediately became hypoxic with O2 sats decreasing to 60% with shallow respirations on RA, requiring bag ventilation via tracheostomy.  Due to continued hypoxia pts #6 cuffless tracheostomy was changed to a #6 cuffed trach, and pt placed on mechanical ventilation via trach with O2 sats increasing to 92%.  Upon removal of cuffless trach RT noted pts inner canula was partially occluded by dried secretions.  VBG revealed pH 7.15/pCO2 118/acid-base excess 9.2/bicarb 41.1.  CXR concerning for bilateral trace pleural effusions and emphysema.  Code sepsis activated by ER physician pt received 1L LR bolus, ceftriaxone, azithromycin, and vancomycin due to concern of pneumonia.  PCCM team contacted for ICU admission.   Pertinent  Medical History  Atrial Fibrillation on Eliquis C. Difficile Severe COPD Chronic tracheostomy Anxiety & depression Hepatitis  C Arthritis HTN Fibromyalgia Anemia CAD Thoracic AAA  Significant Hospital Events: Including procedures, antibiotic start and stop dates in addition to other pertinent events   09/27: Pt admitted to ICU with acute on chronic hypoxic hypercapnic respiratory failure secondary to end stage COPD requiring mechanical ventilation via chronic tracheostomy  09/29: Tolerating PSV, will attempt TCT as tolerated 09/30: Unable to tolerate weaning pressures on PSV (immediately becomes extremely anxious and SOB with hypooxia), add scheduled Valium, plan to place Dobhoff for enteral access 10/2: Tolerating trach collars trials at 35%.  10/3: Eliquis restarted 10/1 after no plans for GI procedure and no further evidence of GI bleeding with hgb stable >8.0 10/4: No acute events overnight. Benzos adjusted yesterday per psychiatry, IV Valium and IV morphine discontinued.  10/5: Frequent episodes of diarrhea, continues on tube feeds. SLP eval today for po intake assessment.  Palliative asessment with hospice evaluation per patient request   Objective   Blood pressure (!) 131/96, pulse (!) 102, temperature 99.5 F (37.5 C), temperature source Oral, resp. rate (!) 21, height 5' (1.524 m), weight 49.1 kg, SpO2 98 %.    Vent Mode: PRVC FiO2 (%):  [28 %] 28 % Set Rate:  [14 bmp] 14 bmp Vt Set:  [450 mL] 450 mL PEEP:  [5 cmH20] 5 cmH20   Intake/Output Summary (Last 24 hours) at 03/28/2021 1129 Last data filed at 03/28/2021 0600 Gross per 24 hour  Intake 1640 ml  Output 950 ml  Net 690 ml   Filed Weights   03/26/21 0422 03/27/21 0423 03/28/21 0434  Weight: 52.8 kg 50.9 kg 49.1 kg   Examination: General: chronically ill appearing frail female, NAD, mechanically  ventilated via chronic tracheostomy HENT: #6 Shiley  midline chronic tracheostomy, no JVD  Lungs: mechanical breath sounds throughout, no wheezing or rales noted, synchronous with vent, even, non labored  Cardiovascular: Tachycardia, regular  rhythm, no R/G, 2+ radial/1+ distal pulses trace bilateral lower extremity edema  Abdomen: +BS x4, soft, non tender, non distended, bowel sounds active x4 quadrants Extremities: normal tone, no edema  Neuro: awake, alert, following commands, no focal deficits, pupils PERRL GU: External catheter in place  Resolved Hospital Problem list   Lactic acidosis  Assessment & Plan:   Acute on chronic hypoxic hypercapnic respiratory failure secondary to AECOPD and mucous plugging & questionable underlying pneumonia Enterobacter 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 03/21/21 Mechanical ventilation via chronic tracheostomy  Hx: Chronic Home O2 @4 .5~5L during the day and trilogy with capped tracheostomy qhs -Trach collar trials during the day with vent as needed at night -Plateau pressures less than 30 cm H20 -Wean FiO2 & PEEP as tolerated to maintain O2 sats >88% - Ceftazidime started 10/1    - s/p azithromycin, ceftriaxone 9/28, one time dose vanco, ceftriaxone, azithromycin 9/27 -Follow intermittent Chest X-ray & ABG as needed -Attempt SBT's and trach collar trials as tolerated -May require ventilator support qhs -Implement VAP Bundle -Scheduled & Prn Bronchodilators -IV and Nebulized steroids - Ensure adequate pulmonary hygiene   Mildly elevated troponin likely secondary to demand ischemia  Chronic atrial fibrillation on Eliquis SVT  Hx: HTN and CAD  -Continuous telemetry monitoring  -Eliquis restarted 10/1 after GI cleared  -Continue Metoprolol, Cardizem, and Lisinopril as BP and renal functions permits -TSH low, but Free T4 normal -Prn metoprolol for hr >115   Acute Blood Loss anemia due to GI Bleed superimposed on Anemia of Chronic Disease, stable Patient with dark tarry stools POA that were Hemoccult positive Hx: Anemia -Monitor for S/Sx of bleeding -Trend CBC - hgb stable > 8.0, patient with hx chronic anemia -SCD's for VTE Prophylaxis,  Eliquis restarted 10/1 -Transfuse for Hgb <7 -Pantoprazole 40mg  IV BID -Hold NSAIDs, Anticoagulation, ASA -GI evaluated ~ patient declined EGD, recommends colonoscopy either inpatient or outpatient if pt is agreeable, ok to restart Eliquis and start diet once enteral access obtained, GI signed off 9/29  Acute encephalopathy secondary to hypercapnia >>resolved Anxiety/Panic Attacks with weaning Hx: Anxiety and depression  Benzodiazepine dependency - Maintain RASS goal 0 to -1 - Continue home po meds: diazepam, gabapentin, Seroquel, amitriptyline    - Diazepam tablets increased to 7.5 mg q8h, per tube per psychiatry    - Hydroxyzine 25mg  TID prn per tube added 10/3 - IV Valium and IV morphine DC 10/3 - Monitor EKG, on Seroquel - Minimize use of IV sedatives - Urine drug screen positive for Benzodiazepines - Evaluated by psychiatry, appreciate input  - CT head negative for acute intracranial abnormality  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 Could tolerate po intake however in very small amounts, poor nutritional status on po intake alone.   Elevated liver enzymes likely secondary to hepatitis C hx>>resolved -Trend hepatic function panel prn  Lactic acidosis>>resolved -Trend BMP and lactic acid prn  Best Practice (right click and "Reselect all SmartList Selections" daily)   Diet/type: NPO, tube feeds  DVT prophylaxis: SCD's, Eliquis restarted 10/1 GI prophylaxis: PPI Lines: N/A Foley: N/A Code Status:  full code Last  date of multidisciplinary goals of care discussion [03/28/2021]   Labs   CBC: Recent Labs  Lab 03/22/21 0328 03/22/21 0929 03/23/21 1314 03/24/21 0321 03/25/21 0547 03/26/21 0523 03/27/21 0608  WBC 8.1  --  8.4 9.1 8.6 10.6* 12.0*  NEUTROABS 6.5  --   --   --   --   --   8.6*  HGB 7.3*   < > 8.7* 8.2* 8.1* 8.2* 8.0*  HCT 23.9*   < > 27.6* 25.4* 25.1* 26.2* 25.4*  MCV 97.2  --  96.8 94.4 95.4 96.7 96.2  PLT 301  --  318 341 364 388 384   < > = values in this interval not displayed.    Basic Metabolic Panel: Recent Labs  Lab 03/21/21 1518 03/22/21 0328 03/22/21 0929 03/23/21 1337 03/24/21 0321 03/25/21 0547 03/26/21 0523 03/27/21 0608  NA  --   --    < > 136 134* 135 137 137  K  --   --    < > 3.5 3.2* 3.8 3.6 3.6  CL  --   --    < > 90* 90* 94* 96* 96*  CO2  --   --    < > 36* 37* 36* 35* 34*  GLUCOSE  --   --    < > 104* 116* 108* 102* 99  BUN  --   --    < > 11 17 19 20 20   CREATININE  --   --    < > 0.57 0.68 0.69 0.66 0.51  CALCIUM  --   --    < > 8.7* 8.6* 8.2* 8.3* 8.6*  MG 2.0 2.1  --   --  1.7 2.3 2.0 1.9  PHOS 5.0*  --   --   --  2.8 3.1 3.4 3.6   < > = values in this interval not displayed.   GFR: Estimated Creatinine Clearance: 47 mL/min (by C-G formula based on SCr of 0.51 mg/dL). Recent Labs  Lab 03/21/21 2034 03/22/21 0328 03/23/21 1314 03/24/21 0321 03/25/21 0547 03/26/21 0523 03/27/21 0608  PROCALCITON  --  0.35  --   --   --   --   --   WBC  --  8.1   < > 9.1 8.6 10.6* 12.0*  LATICACIDVEN 1.1  --   --   --   --   --   --    < > = values in this interval not displayed.    Liver Function Tests: Recent Labs  Lab 03/23/21 1337 03/24/21 0321 03/25/21 0547  AST 27 29 23   ALT 30 29 26   ALKPHOS 29* 32* 28*  BILITOT 0.9 0.5 0.8  PROT 5.9* 5.3* 5.1*  ALBUMIN 3.3* 2.9* 2.8*   No results for input(s): LIPASE, AMYLASE in the last 168 hours. No results for input(s): AMMONIA in the last 168 hours.  ABG    Component Value Date/Time   PHART 7.50 (H) 03/21/2021 0525   PCO2ART 40 03/21/2021 0525   PO2ART 120 (H) 03/21/2021 0525   HCO3 31.2 (H) 03/21/2021 0525   O2SAT 99.0 03/21/2021 0525     Coagulation Profile: No results for input(s): INR, PROTIME in the last 168 hours.   Cardiac Enzymes: No results for  input(s): CKTOTAL, CKMB, CKMBINDEX, TROPONINI in the last 168 hours.  HbA1C: Hgb A1c MFr Bld  Date/Time Value Ref Range Status  01/08/2021 06:28 AM 4.9 4.8 - 5.6 % Final    Comment:    (NOTE)  Pre diabetes:          5.7%-6.4%  Diabetes:              >6.4%  Glycemic control for   <7.0% adults with diabetes   06/06/2020 12:07 PM 5.3 4.8 - 5.6 % Final    Comment:    (NOTE) Pre diabetes:          5.7%-6.4%  Diabetes:              >6.4%  Glycemic control for   <7.0% adults with diabetes     CBG: Recent Labs  Lab 03/26/21 0726 03/26/21 1134 03/26/21 1532 03/26/21 1925 03/26/21 2318  GLUCAP 105* 119* 158* 116* 96   Past Medical History:  She,  has a past medical history of Anemia, Anxiety, Arthritis, Atrial fibrillation (HCC), C. difficile colitis (09/2015), COPD (chronic obstructive pulmonary disease) (HCC), Depression, Dyspnea, Dysrhythmia, Fibromyalgia, H/O tracheostomy, Hep C w/ coma, chronic, Hypertension, MRSA pneumonia (HCC) (2017), On home oxygen therapy, Osteoporosis, Peripheral neuropathy, RLS (restless legs syndrome), S/P percutaneous endoscopic gastrostomy (PEG) tube placement (HCC) (09/2015), and Ventilator associated pneumonia (HCC) (10/2015).   Surgical History:   Past Surgical History:  Procedure Laterality Date   ABDOMINAL HYSTERECTOMY     BREAST SURGERY Bilateral    Breast Implants   DILATION AND CURETTAGE OF UTERUS     PEG PLACEMENT N/A 06/21/2020   Procedure: PERCUTANEOUS ENDOSCOPIC GASTROSTOMY (PEG) PLACEMENT;  Surgeon: Regis Bill, MD;  Location: [facility] ENDOSCOPY;  Service: Endoscopy;  Laterality: N/A;   REVERSE SHOULDER ARTHROPLASTY Right 03/06/2017   Procedure: REVERSE SHOULDER ARTHROPLASTY;  Surgeon: Christena Flake, MD;  Location: [facility] ORS;  Service: Orthopedics;  Laterality: Right;   ROTATOR CUFF REPAIR Bilateral    TRACHEOSTOMY TUBE PLACEMENT N/A 06/15/2020   Procedure: TRACHEOSTOMY;  Surgeon: Geanie Logan, MD;  Location: [facility] ORS;   Service: ENT;  Laterality: N/A;     Social History:   reports that she quit smoking about 5 years ago. Her smoking use included cigarettes. She smoked an average of 1.5 packs per day. She has never used smokeless tobacco. She reports that she does not drink alcohol and does not use drugs.   Family History:  Her family history includes Hypertension in her mother.   Allergies No Known Allergies   Home Medications  Prior to Admission medications   Medication Sig Start Date End Date Taking? Authorizing Provider  albuterol (VENTOLIN HFA) 108 (90 Base) MCG/ACT inhaler Inhale 2 puffs into the lungs every 4 (four) hours as needed for wheezing or shortness of breath.    [provider]  amitriptyline (ELAVIL) 25 MG tablet Take 25 mg by mouth at bedtime.  01/14/19   [provider]  apixaban (ELIQUIS) 5 MG TABS tablet Take 1 tablet (5 mg total) by mouth 2 (two) times daily. 11/10/16   Adrian Saran, MD  budesonide (PULMICORT) 0.5 MG/2ML nebulizer solution Take 0.5 mg by nebulization daily. 11/28/20   [provider]  diazepam (VALIUM) 5 MG tablet Take 1-1.5 tablets (5-7.5 mg total) by mouth every 8 (eight) hours as needed for anxiety. 03/12/21   Osvaldo Shipper, MD  diltiazem (CARDIZEM CD) 120 MG 24 hr capsule Take 120 mg by mouth daily. 04/29/19   [provider]  estradiol (ESTRACE) 1 MG tablet Take 1 mg by mouth daily. 09/10/16   [provider]  gabapentin (NEURONTIN) 300 MG capsule Take 300 mg by mouth 3 (three) times daily. 05/06/20   [provider]  ipratropium-albuterol (  DUONEB) 0.5-2.5 (3) MG/3ML SOLN Take 3 mLs by nebulization every 6 (six) hours. 02/23/21   Salena Saner, MD  iron polysaccharides (NIFEREX) 150 MG capsule Take 1 capsule (150 mg total) by mouth daily. 01/12/21 04/12/21  Gillis Santa, MD  MAGNESIUM-OXIDE 400 (241.3 Mg) MG tablet Take 400 mg by mouth daily.  08/24/16   [provider]  methocarbamol (ROBAXIN) 500 MG  tablet Take 500 mg by mouth 2 (two) times daily. 12/27/20   [provider]  metoprolol tartrate (LOPRESSOR) 25 MG tablet Take 1 tablet (25 mg total) by mouth 2 (two) times daily. 03/12/21 06/10/21  Osvaldo Shipper, MD  omeprazole (PRILOSEC) 40 MG capsule Take 40 mg by mouth daily. 04/16/19   [provider]  predniSONE (DELTASONE) 5 MG tablet Take 5 mg by mouth daily. 12/25/20   [provider]  QUEtiapine (SEROQUEL) 25 MG tablet Take 25 mg by mouth at bedtime.  10/01/16   [provider]  rosuvastatin (CRESTOR) 10 MG tablet Take 10 mg by mouth at bedtime. 10/23/20   [provider]  vitamin B-12 100 MCG tablet Take 1 tablet (100 mcg total) by mouth daily. 01/12/21 04/12/21  Gillis Santa, MD  Vitamin D, Ergocalciferol, (DRISDOL) 1.25 MG (50000 UNIT) CAPS capsule Take 1 capsule (50,000 Units total) by mouth every 7 (seven) days. 01/17/21 04/17/21  Gillis Santa, MD    Scheduled Meds:  amitriptyline  25 mg Per Tube QHS   apixaban  5 mg Per Tube BID   budesonide (PULMICORT) nebulizer solution  0.25 mg Nebulization BID   chlorhexidine  15 mL Mouth Rinse BID   Chlorhexidine Gluconate Cloth  6 each Topical Q0600   diazepam  7.5 mg Per Tube Q8H   docusate  100 mg Per Tube BID   free water  60 mL Per Tube Q4H   gabapentin  300 mg Per Tube TID   ipratropium-albuterol  3 mL Nebulization Q6H   iron polysaccharides  150 mg Per Tube Daily   lidocaine  1 patch Transdermal Q24H   magnesium oxide  400 mg Per Tube Daily   mouth rinse  15 mL Mouth Rinse q12n4p   methocarbamol  500 mg Per Tube BID   methylPREDNISolone (SOLU-MEDROL) injection  20 mg Intravenous Q24H   metoprolol tartrate  25 mg Per Tube BID   pantoprazole (PROTONIX) IV  40 mg Intravenous Q12H   polyethylene glycol  17 g Per Tube Daily   QUEtiapine  25 mg Per Tube QHS   rosuvastatin  10 mg Per Tube QHS   cyanocobalamin  100 mcg Per Tube Daily   Continuous Infusions:  sodium chloride     cefTAZidime  (FORTAZ)  IV     feeding supplement (VITAL AF 1.2 CAL) 45 mL/hr at 03/27/21 1900   PRN Meds:.docusate, hydrOXYzine, ipratropium-albuterol, metoprolol tartrate, phenol, white petrolatum  Critical care time: 35 minutes       Emily Filbert, PA-S [place] DPAS   Critical care provider statement:   Total critical care time: 33 minutes   Performed by: Karna Christmas MD   Critical care time was exclusive of separately billable procedures and treating other patients.   Critical care was necessary to treat or prevent imminent or life-threatening deterioration.   Critical care was time spent personally by me on the following activities: development of treatment plan with patient and/or surrogate as well as nursing, discussions with consultants, evaluation of patient's response to treatment, examination of patient, obtaining history from patient or surrogate, ordering and  performing treatments and interventions, ordering and review of laboratory studies, ordering and review of radiographic studies, pulse oximetry and re-evaluation of patient's condition.    Vida Rigger, M.D.  Pulmonary & Critical Care Medicine

## 2021-03-28 NOTE — Consult Note (Signed)
[facility] Face-to-Face Psychiatry Consult   Reason for Consult: Follow-up consult 70 year old woman with chronic anxiety with severe respiratory problems. Referring Physician:  Karna Christmas Patient Identification: Virginia [name] MRN:  [id number] Principal Diagnosis: Generalized anxiety disorder Diagnosis:  Principal Problem:   Generalized anxiety disorder Active Problems:   Acute on chronic respiratory failure with hypoxemia (HCC)   Total Time spent with patient: 20 minutes  Subjective:   Virginia [name] is a 70 y.o. female patient admitted with "I have some hard decisions to make".  HPI: Patient seen chart reviewed.  Patient continues to feel anxious much of the time but is not having acute panic attacks.  Continues to feel that her breathing is not improving she indicates that she had a conversation with somebody today and has decided that she wants palliative care.  She says that she feels like she has no quality of life.  She says her greatest concern is how her child is going to do going forward.  Patient denies being depressed denies any actual suicidal intent says that she has thought clearly about her low quality of life and what she sees as a low chance of significant improvement.  Past Psychiatric History: Chronic anxiety.  Takes Valium at home.  Poor response in the past to SSRIs.  Risk to Self:   Risk to Others:   Prior Inpatient Therapy:   Prior Outpatient Therapy:    Past Medical History:  Past Medical History:  Diagnosis Date   Anemia    Anxiety    Arthritis    Atrial fibrillation (HCC)    C. difficile colitis 09/2015   COPD (chronic obstructive pulmonary disease) (HCC)    Depression    Dyspnea    Dysrhythmia    Fibromyalgia    H/O tracheostomy    Hep C w/ coma, chronic    Hypertension    MRSA pneumonia (HCC) 2017   On home oxygen therapy    3 L/M    Osteoporosis    Peripheral neuropathy    RLS (restless legs syndrome)    S/P percutaneous endoscopic  gastrostomy (PEG) tube placement (HCC) 09/2015   Ventilator associated pneumonia (HCC) 10/2015   [facility], [place]    Past Surgical History:  Procedure Laterality Date   ABDOMINAL HYSTERECTOMY     BREAST SURGERY Bilateral    Breast Implants   DILATION AND CURETTAGE OF UTERUS     PEG PLACEMENT N/A 06/21/2020   Procedure: PERCUTANEOUS ENDOSCOPIC GASTROSTOMY (PEG) PLACEMENT;  Surgeon: Regis Bill, MD;  Location: [facility] ENDOSCOPY;  Service: Endoscopy;  Laterality: N/A;   REVERSE SHOULDER ARTHROPLASTY Right 03/06/2017   Procedure: REVERSE SHOULDER ARTHROPLASTY;  Surgeon: Christena Flake, MD;  Location: [facility] ORS;  Service: Orthopedics;  Laterality: Right;   ROTATOR CUFF REPAIR Bilateral    TRACHEOSTOMY TUBE PLACEMENT N/A 06/15/2020   Procedure: TRACHEOSTOMY;  Surgeon: Geanie Logan, MD;  Location: [facility] ORS;  Service: ENT;  Laterality: N/A;   Family History:  Family History  Problem Relation Age of Onset   Hypertension Mother    Family Psychiatric  History: See previous Social History:  Social History   Substance and Sexual Activity  Alcohol Use No     Social History   Substance and Sexual Activity  Drug Use No    Social History   Socioeconomic History   Marital status: Single    Spouse name: Not on file   Number of children: Not on file   Years of education: Not on file  Highest education level: Not on file  Occupational History   Not on file  Tobacco Use   Smoking status: Former    Packs/day: 1.50    Types: Cigarettes    Quit date: 10/05/2015    Years since quitting: 5.4   Smokeless tobacco: Never  Vaping Use   Vaping Use: Some days   Last attempt to quit: 10/05/2015  Substance and Sexual Activity   Alcohol use: No   Drug use: No   Sexual activity: Not on file  Other Topics Concern   Not on file  Social History Narrative   Not on file   Social Determinants of Health   Financial Resource Strain: Not on file  Food Insecurity: Not on file   Transportation Needs: Not on file  Physical Activity: Not on file  Stress: Not on file  Social Connections: Not on file   Additional Social History:    Allergies:  No Known Allergies  Labs:  Results for orders placed or performed during the hospital encounter of 03/20/21 (from the past 48 hour(s))  Glucose, capillary     Status: Abnormal   Collection Time: 03/26/21  7:25 PM  Result Value Ref Range   Glucose-Capillary 116 (H) 70 - 99 mg/dL    Comment: Glucose reference range applies only to samples taken after fasting for at least 8 hours.  Glucose, capillary     Status: None   Collection Time: 03/26/21 11:18 PM  Result Value Ref Range   Glucose-Capillary 96 70 - 99 mg/dL    Comment: Glucose reference range applies only to samples taken after fasting for at least 8 hours.   Comment 1 Notify RN    Comment 2 Document in Chart   CBC with Differential/Platelet     Status: Abnormal   Collection Time: 03/27/21  6:08 AM  Result Value Ref Range   WBC 12.0 (H) 4.0 - 10.5 K/uL   RBC 2.64 (L) 3.87 - 5.11 MIL/uL   Hemoglobin 8.0 (L) 12.0 - 15.0 g/dL   HCT 50.5 (L) 39.7 - 67.3 %   MCV 96.2 80.0 - 100.0 fL   MCH 30.3 26.0 - 34.0 pg   MCHC 31.5 30.0 - 36.0 g/dL   RDW 41.9 (H) 37.9 - 02.4 %   Platelets 384 150 - 400 K/uL   nRBC 0.0 0.0 - 0.2 %   Neutrophils Relative % 72 %   Neutro Abs 8.6 (H) 1.7 - 7.7 K/uL   Lymphocytes Relative 15 %   Lymphs Abs 1.8 0.7 - 4.0 K/uL   Monocytes Relative 11 %   Monocytes Absolute 1.3 (H) 0.1 - 1.0 K/uL   Eosinophils Relative 1 %   Eosinophils Absolute 0.2 0.0 - 0.5 K/uL   Basophils Relative 0 %   Basophils Absolute 0.0 0.0 - 0.1 K/uL   Immature Granulocytes 1 %   Abs Immature Granulocytes 0.16 (H) 0.00 - 0.07 K/uL    Comment: Performed at [facility], [address], [place], [place] [address]  Basic metabolic panel     Status: Abnormal   Collection Time: 03/27/21  6:08 AM  Result Value Ref Range   Sodium 137 135 - 145 mmol/L    Potassium 3.6 3.5 - 5.1 mmol/L   Chloride 96 (L) 98 - 111 mmol/L   CO2 34 (H) 22 - 32 mmol/L   Glucose, Bld 99 70 - 99 mg/dL    Comment: Glucose reference range applies only to samples taken after fasting for at least 8  hours.   BUN 20 8 - 23 mg/dL   Creatinine, Ser 0.76 0.44 - 1.00 mg/dL   Calcium 8.6 (L) 8.9 - 10.3 mg/dL   GFR, Estimated >80 >88 mL/min    Comment: (NOTE) Calculated using the CKD-EPI Creatinine Equation (2021)    Anion gap 7 5 - 15    Comment: Performed at [facility], [address] [place], [place] [address]  Magnesium     Status: None   Collection Time: 03/27/21  6:08 AM  Result Value Ref Range   Magnesium 1.9 1.7 - 2.4 mg/dL    Comment: Performed at [facility], [address] [place], [place] [address]  Phosphorus     Status: None   Collection Time: 03/27/21  6:08 AM  Result Value Ref Range   Phosphorus 3.6 2.5 - 4.6 mg/dL    Comment: Performed at [facility], [address] [place], [place] [address]    Current Facility-Administered Medications  Medication Dose Route Frequency Provider Last Rate Last Admin   0.9 %  sodium chloride infusion  250 mL Intravenous Continuous Ouma, Hubbard Hartshorn, NP       amitriptyline (ELAVIL) tablet 25 mg  25 mg Per Tube QHS Lianne Cure, NP   25 mg at 03/27/21 2126   apixaban (ELIQUIS) tablet 5 mg  5 mg Per Tube BID Erin Fulling, MD   5 mg at 03/28/21 1000   budesonide (PULMICORT) nebulizer solution 0.25 mg  0.25 mg Nebulization BID Jimmye Norman, NP   0.25 mg at 03/28/21 0818   cefTAZidime (FORTAZ) 2 g in sodium chloride 0.9 % 100 mL IVPB  2 g Intravenous Q12H Vida Rigger, MD 200 mL/hr at 03/28/21 1702 2 g at 03/28/21 1702   chlorhexidine (PERIDEX) 0.12 % solution 15 mL  15 mL Mouth Rinse BID Erin Fulling, MD   15 mL at 03/28/21 1000   Chlorhexidine Gluconate Cloth 2 % PADS 6 each  6 each Topical K4628 Erin Fulling, MD   6 each at 03/26/21 1129   diazepam (VALIUM) tablet  7.5 mg  7.5 mg Per Tube Q8H Minnie Shi T, MD   7.5 mg at 03/28/21 1400   docusate (COLACE) 50 MG/5ML liquid 100 mg  100 mg Per Tube BID Jimmye Norman, NP   100 mg at 03/26/21 2139   docusate (COLACE) 50 MG/5ML liquid 100 mg  100 mg Per Tube BID PRN Erin Fulling, MD       feeding supplement (VITAL AF 1.2 CAL) liquid 1,000 mL  1,000 mL Per Tube Continuous Erin Fulling, MD 45 mL/hr at 03/27/21 1900 Infusion Verify at 03/27/21 1900   free water 60 mL  60 mL Per Tube Q4H Erin Fulling, MD   60 mL at 03/28/21 1554   gabapentin (NEURONTIN) capsule 300 mg  300 mg Per Tube TID Erin Fulling, MD   300 mg at 03/28/21 1554   hydrOXYzine (ATARAX/VISTARIL) tablet 25 mg  25 mg Per Tube TID PRN Lianne Cure, NP   25 mg at 03/28/21 1657   ipratropium-albuterol (DUONEB) 0.5-2.5 (3) MG/3ML nebulizer solution 3 mL  3 mL Nebulization Q6H Ouma, Hubbard Hartshorn, NP   3 mL at 03/28/21 1440   ipratropium-albuterol (DUONEB) 0.5-2.5 (3) MG/3ML nebulizer solution 3 mL  3 mL Nebulization Q6H PRN Jimmye Norman, NP       iron polysaccharides (NIFEREX) capsule 150 mg  150 mg Per Tube Daily Erin Fulling, MD   150 mg at 03/28/21  1000   lidocaine (LIDODERM) 5 % 1 patch  1 patch Transdermal Q24H Lianne Cure, NP   1 patch at 03/28/21 1144   magnesium oxide (MAG-OX) tablet 400 mg  400 mg Per Tube Daily Erin Fulling, MD   400 mg at 03/28/21 1000   MEDLINE mouth rinse  15 mL Mouth Rinse q12n4p Erin Fulling, MD   15 mL at 03/28/21 1555   methocarbamol (ROBAXIN) tablet 500 mg  500 mg Per Tube BID Erin Fulling, MD   500 mg at 03/28/21 1000   methylPREDNISolone sodium succinate (SOLU-MEDROL) 40 mg/mL injection 20 mg  20 mg Intravenous Q24H Harlon Ditty D, NP   20 mg at 03/28/21 1000   metoprolol tartrate (LOPRESSOR) injection 2.5-5 mg  2.5-5 mg Intravenous Q6H PRN Lianne Cure, NP       metoprolol tartrate (LOPRESSOR) tablet 25 mg  25 mg Per Tube BID Erin Fulling, MD   25 mg at 03/28/21 1000   pantoprazole  (PROTONIX) injection 40 mg  40 mg Intravenous Q12H Jimmye Norman, NP   40 mg at 03/28/21 1000   phenol (CHLORASEPTIC) mouth spray 1 spray  1 spray Mouth/Throat PRN Vida Rigger, MD       polyethylene glycol (MIRALAX / GLYCOLAX) packet 17 g  17 g Per Tube Daily Jimmye Norman, NP   17 g at 03/26/21 1008   QUEtiapine (SEROQUEL) tablet 25 mg  25 mg Per Tube QHS Erin Fulling, MD   25 mg at 03/27/21 2122   rosuvastatin (CRESTOR) tablet 10 mg  10 mg Per Tube QHS Erin Fulling, MD   10 mg at 03/27/21 2121   vitamin B-12 (CYANOCOBALAMIN) tablet 100 mcg  100 mcg Per Tube Daily Erin Fulling, MD   100 mcg at 03/28/21 1000   white petrolatum (VASELINE) gel   Topical PRN Erin Fulling, MD        Musculoskeletal: Strength & Muscle Tone: decreased Gait & Station: unable to stand Patient leans: N/A            Psychiatric Specialty Exam:  Presentation  General Appearance:  No data recorded Eye Contact: No data recorded Speech: No data recorded Speech Volume: No data recorded Handedness: No data recorded  Mood and Affect  Mood: No data recorded Affect: No data recorded  Thought Process  Thought Processes: No data recorded Descriptions of Associations:No data recorded Orientation:No data recorded Thought Content:No data recorded History of Schizophrenia/Schizoaffective disorder:No data recorded Duration of Psychotic Symptoms:No data recorded Hallucinations:No data recorded Ideas of Reference:No data recorded Suicidal Thoughts:No data recorded Homicidal Thoughts:No data recorded  Sensorium  Memory: No data recorded Judgment: No data recorded Insight: No data recorded  Executive Functions  Concentration: No data recorded Attention Span: No data recorded Recall: No data recorded Fund of Knowledge: No data recorded Language: No data recorded  Psychomotor Activity  Psychomotor Activity: No data recorded  Assets  Assets: No data  recorded  Sleep  Sleep: No data recorded  Physical Exam: Physical Exam Vitals and nursing note reviewed.  Constitutional:      Appearance: She is ill-appearing.  HENT:     Head: Normocephalic and atraumatic.     Mouth/Throat:     Pharynx: Oropharynx is clear.  Eyes:     Pupils: Pupils are equal, round, and reactive to light.  Cardiovascular:     Rate and Rhythm: Normal rate and regular rhythm.  Abdominal:     General: Abdomen is flat.     Palpations: Abdomen  is soft.  Musculoskeletal:        General: Normal range of motion.  Skin:    General: Skin is warm and dry.  Neurological:     General: No focal deficit present.     Mental Status: She is alert. Mental status is at baseline.  Psychiatric:        Attention and Perception: Attention normal.        Mood and Affect: Mood is anxious.        Speech: Speech is delayed.        Behavior: Behavior is slowed.        Thought Content: Thought content normal.        Cognition and Memory: Cognition normal.   Review of Systems  Constitutional:  Positive for malaise/fatigue.  HENT: Negative.    Eyes: Negative.   Respiratory:  Positive for shortness of breath.   Cardiovascular: Negative.   Gastrointestinal: Negative.   Musculoskeletal: Negative.   Skin: Negative.   Neurological: Negative.   Psychiatric/Behavioral:  Negative for hallucinations and suicidal ideas. The patient is nervous/anxious.   Blood pressure 135/90, pulse (!) 108, temperature 99.7 F (37.6 C), temperature source Oral, resp. rate 20, height 5' (1.524 m), weight 49.1 kg, SpO2 98 %. Body mass index is 21.14 kg/m.  Treatment Plan Summary: Plan no change to medication.  We adjusted the Valium to replicate her home regimen as much as possible.  Spent time validating and offering supportive counseling to the patient.  Validated her desire to make sure her son was taken care of.  Offered the services of chaplain which she declines at this time.  We will follow as  needed no indication to change medicine.  Disposition: Patient does not meet criteria for psychiatric inpatient admission.  Mordecai Rasmussen, MD 03/28/2021 6:31 PM

## 2021-03-28 NOTE — Progress Notes (Signed)
PHARMACY CONSULT NOTE - FOLLOW UP  Pharmacy Consult for Electrolyte Monitoring and Replacement   Recent Labs: Potassium (mmol/L)  Date Value  03/27/2021 3.6   Magnesium (mg/dL)  Date Value  88/82/8003 1.9   Calcium (mg/dL)  Date Value  49/17/9150 8.6 (L)   Albumin (g/dL)  Date Value  56/97/9480 2.8 (L)   Phosphorus (mg/dL)  Date Value  16/55/3748 3.6   Sodium (mmol/L)  Date Value  03/27/2021 137     Assessment: 70 year old female presented to the ED unresponsive. Patient required mechanical ventilation via chronic trach. Respiratory failure s/t end stage COPD. Patient with dark tarry stools that were hemoccult positive in the ED, concerning for GIB. Pharmacy consult to manage electrolytes.  Started Mg oxide 400 mg daily.  On free water 60 ml q4H.   Goal of Therapy:  Electrolytes WNL  Plan:  --No replacement at this time  --Defer frequency of lab monitoring to provider --Continue to follow along  Pricilla Riffle, PharmD, BCPS Clinical Pharmacist 03/28/2021 1:45 PM

## 2021-03-28 NOTE — Progress Notes (Signed)
Nutrition Follow-up  DOCUMENTATION CODES:   Non-severe (moderate) malnutrition in context of chronic illness  INTERVENTION:   Continue Vital AF 1.2 @ 38ml/hr  Free water flushes 2ml q4 hours   Regimen provides 1296 kcal/day, 81g/day of protein and 1262mL/day of free water   NUTRITION DIAGNOSIS:   Moderate Malnutrition (in the context of chronic illness (COPD)) related to poor appetite as evidenced by mild muscle depletion, moderate fat depletion, mild fat depletion, moderate muscle depletion.  GOAL:   Patient will meet greater than or equal to 90% of their needs  MONITOR:   Labs, Weight trends, TF tolerance, Skin, I & O's, PO intake, Supplement acceptance   ASSESSMENT:   70 y.o. 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 fib, fibromyalgia, HTN, osteoporosis, and Hep C brought to ED via EMS from home with hypoxia after being found unresponsive at home. Frequently admitted to [facility] ICU.  Pt anxious today and is asking for anxiety medications. Pt remains on trach collar during the day and vent at night. NGT in place and pt is tolerating tube feeds well at goal rate. Pt seen by SLP; pt ok for a pureed diet but has poor appetite. Will leave NGT and feeds for now until pt's oral intake improves. Pt does enjoy strawberry Ensure and RD will add this once her diet advances. Per chart, pt is down 5lbs(4%) since admission if her bed weights are correct. Plan is for possible SNF at discharge.   Medications reviewed and include: colace, iron polysaccharides, Mg oxide, solu-medrol, protonix, miralax, B12, ceftazidime   Labs reviewed: K 3.6 wnl, P 3.6 wnl, Mg 1.9 wnl Wbc- 12.0(H), Hgb 8.0(L), Hct 25.4(L)  Diet Order:    Diet Order             Diet NPO time specified  Diet effective now                  EDUCATION NEEDS:   No education needs have been identified at this time  Skin:  Skin Assessment: Reviewed RN Assessment  Last BM:  10/4- type 7  Height:    Ht Readings from Last 1 Encounters:  03/24/21 5' (1.524 m)    Weight:   Wt Readings from Last 1 Encounters:  03/28/21 49.1 kg    Ideal Body Weight:  45.5 kg  BMI:  Body mass index is 21.14 kg/m.  Estimated Nutritional Needs:   Kcal:  1300-1500kcal/day  Protein:  65-75g/day  Fluid:  1.3-1.5L/day  Betsey Holiday MS, RD, LDN Please refer to [facility] for RD and/or RD on-call/weekend/after hours pager

## 2021-03-28 NOTE — Progress Notes (Signed)
Occupational Therapy Treatment Patient Details Name: Virginia [name] MRN: [id number] DOB: 1950-08-17 Today's Date: 03/28/2021   History of present illness This is a 70 yo female with end stage COPD and chronic tracheostomy who presented to [facility] ER via EMS from home on 09/27 due to unresponsiveness. Patient recently discharged   OT comments  Upon entering the room, pt supine in bed and requesting RN suction before working with therapy. RN assisted pt and then pt was able to don her PMSV independently during session. Bed mobility with min guard to EOB. Pt standing and transferring onto [facility] with min HHA. Pt able to have BM and void. She performed some hygiene while seated and then standing for therapist assist with thoroughness. Pt returning to EOB in same manner as above. She reports feeling very fatigued and SOB but all vitals remain stable during session. Sit >supine with min guard and pt repositioned for comfort. All needs within reach. Pt continues to benefit from OT intervention.    Recommendations for follow up therapy are one component of a multi-disciplinary discharge planning process, led by the attending physician.  Recommendations may be updated based on patient status, additional functional criteria and insurance authorization.    Follow Up Recommendations  Supervision/Assistance - 24 hour;SNF    Equipment Recommendations  None recommended by OT       Precautions / Restrictions Precautions Precautions: Fall Precaution Comments: trach and PMSV Restrictions Weight Bearing Restrictions: No       Mobility Bed Mobility Overal bed mobility: Needs Assistance Bed Mobility: Supine to Sit;Sit to Supine     Supine to sit: Min guard Sit to supine: Min guard   General bed mobility comments: pt refused OOB since she had recently got up with OT.    Transfers Overall transfer level: Needs assistance Equipment used: 1 person hand held assist Transfers: Sit to/from [employer] Sit to Stand: Min guard Stand pivot transfers: Min guard       General transfer comment: to [facility] with vitals remaining stable throughout    Balance Overall balance assessment: Needs assistance Sitting-balance support: Feet supported Sitting balance-Leahy Scale: Good Sitting balance - Comments: Good seated balance without UE assistance at EOB   Standing balance support: Single extremity supported Standing balance-Leahy Scale: Fair Standing balance comment: no loss of balance noted. Min guard/close stand by assistance for safety                           ADL either performed or assessed with clinical judgement   ADL Overall ADL's : Needs assistance/impaired                         Toilet Transfer: Minimal assistance;BSC   Toileting- Clothing Manipulation and Hygiene: Minimal assistance;Sitting/lateral lean       Functional mobility during ADLs: Minimal assistance       Vision Patient Visual Report: No change from baseline            Cognition Arousal/Alertness: Awake/alert Behavior During Therapy: WFL for tasks assessed/performed Overall Cognitive Status: Within Functional Limits for tasks assessed                                 General Comments: trach collar with Fi02 28%. patient placed PMSV during the session. Pt is A and O x 4  Pertinent Vitals/ Pain       Pain Assessment: No/denies pain      Progress Toward Goals  OT Goals(current goals can now be found in the care plan section)  Progress towards OT goals: Progressing toward goals  Acute Rehab OT Goals Patient Stated Goal: to go home OT Goal Formulation: With patient Time For Goal Achievement: 04/05/21 Potential to Achieve Goals: Good  Plan Frequency remains appropriate;Discharge plan needs to be updated       AM-PAC OT "6 Clicks" Daily Activity     Outcome Measure   Help from another person eating meals?:  None Help from another person taking care of personal grooming?: None Help from another person toileting, which includes using toliet, bedpan, or urinal?: A Little Help from another person bathing (including washing, rinsing, drying)?: A Little Help from another person to put on and taking off regular upper body clothing?: A Little Help from another person to put on and taking off regular lower body clothing?: A Little 6 Click Score: 20    End of Session Equipment Utilized During Treatment: Oxygen  OT Visit Diagnosis: Unsteadiness on feet (R26.81);Repeated falls (R29.6);Muscle weakness (generalized) (M62.81);History of falling (Z91.81)   Activity Tolerance Patient tolerated treatment well   Patient Left in bed;with call bell/phone within reach;with bed alarm set   Nurse Communication Mobility status        Time: 1610-9604 OT Time Calculation (min): 35 min  Charges: OT General Charges $OT Visit: 1 Visit OT Treatments $Self Care/Home Management : 23-37 mins  Jackquline Denmark, MS, OTR/L , CBIS ascom [phone]  03/28/21, 3:58 PM

## 2021-03-28 NOTE — Evaluation (Signed)
Clinical/Bedside Swallow Evaluation Patient Details  Name: Virginia [name] MRN: [id number] Date of Birth: [birth year]  Today's Date: 03/28/2021 Time: SLP Start Time (ACUTE ONLY): 0855 SLP Stop Time (ACUTE ONLY): 0940 SLP Time Calculation (min) (ACUTE ONLY): 45 min  Past Medical History:  Past Medical History:  Diagnosis Date   Anemia    Anxiety    Arthritis    Atrial fibrillation (HCC)    C. difficile colitis 09/2015   COPD (chronic obstructive pulmonary disease) (HCC)    Depression    Dyspnea    Dysrhythmia    Fibromyalgia    H/O tracheostomy    Hep C w/ coma, chronic    Hypertension    MRSA pneumonia (HCC) 2017   On home oxygen therapy    3 L/M    Osteoporosis    Peripheral neuropathy    RLS (restless legs syndrome)    S/P percutaneous endoscopic gastrostomy (PEG) tube placement (HCC) 09/2015   Ventilator associated pneumonia (HCC) 10/2015   [facility], [place]   Past Surgical History:  Past Surgical History:  Procedure Laterality Date   ABDOMINAL HYSTERECTOMY     BREAST SURGERY Bilateral    Breast Implants   DILATION AND CURETTAGE OF UTERUS     PEG PLACEMENT N/A 06/21/2020   Procedure: PERCUTANEOUS ENDOSCOPIC GASTROSTOMY (PEG) PLACEMENT;  Surgeon: Regis Bill, MD;  Location: [facility] ENDOSCOPY;  Service: Endoscopy;  Laterality: N/A;   REVERSE SHOULDER ARTHROPLASTY Right 03/06/2017   Procedure: REVERSE SHOULDER ARTHROPLASTY;  Surgeon: Christena Flake, MD;  Location: [facility] ORS;  Service: Orthopedics;  Laterality: Right;   ROTATOR CUFF REPAIR Bilateral    TRACHEOSTOMY TUBE PLACEMENT N/A 06/15/2020   Procedure: TRACHEOSTOMY;  Surgeon: Geanie Logan, MD;  Location: [facility] ORS;  Service: ENT;  Laterality: N/A;   HPI:  Pt is a 70 year old female presented to [facility] ER via EMS from home on 09/27 due to unresponsiveness.  Per ER notes EMS reported pts initial GCS was 3 and O2 sats were unreadable.  EMS provided bag ventilation via tracheostomy en route to the ER.  Pt  also received 2 mg of narcan and initially became responsive.  Pts son informed EMS on the scene pts code status DNR, however there was no DNR paperwork readily available on the scene.  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   ED course  Upon arrival to the ER pt unresponsive to verbal/painful stimulation with O2 sats initially in the mid 90's on RA.  However, pt immediately became hypoxic with O2 sats decreasing to 60% with shallow respirations on RA, requiring bag ventilation via tracheostomy.  Due to continued hypoxia pts #6 cuffless tracheostomy was changed to a #6 cuffed trach, and pt placed on mechanical ventilation via trach with O2 sats increasing to 92%.  Upon removal of cuffless trach RT noted pts inner canula was partially occluded by dried secretions. The patient normally uses the trilogy as a CPAP mask with the tracheostomy capped overnight and is on 4.5 to 5 L nasal cannula during the day.  Pt has had trouble w/ her trach being "clogged before" per report.  Imaging at admit: Trace bilateral pleural effusions.  OF NOTE: Pt wears PMV at baseline at home for verbal communication and when eating/drinking secondary to chronic tracheostomy.    Assessment / Plan / Recommendation  Clinical Impression  Pt seen for assessment of swallowing. She is able to  wear the PMV comfortably for verbal communication and has been wearing it PRN, including this morning since awaking. Pt explained general aspiration precautions and agreed verbally to the need for following them especially sitting upright for all oral intake, No Talking w/ drink in mouth, and wearing PMV for ALL oral intake.   Pt assisted w/ sitting up and using multiple pillows behind her for a forward position. Noted min increased RR rate and effort d/t the moving in bed. After Rest Break, she was then  given trials of thin liquids, ice chips -- no purees/solids given d/t presence of NGT in place currently(for nutritional support per Dietician). NO overt clinical s/s of aspiration were noted w/ trials; No coughing, respiratory status remained relatively calm and overtly unlabored w/ Rest Breaks(as needed), vocal quality clear b/t trials. O2 sats 96%; HR 80s; RR low 20s. Pt held Cup when drinking and fed self following instructions for single, small sips slowly given intermittent verbal cues. Straws were utilized per pt's preference. Oral phase appeared grossly [facility] for bolus management and timely A-P transfer for swallowing. Oral clearing achieved w/ trials. Pt is Missing some Bottom Dentition for full, effective mastication; wears an Upper Denture plate usually.          Recommended a Clear liquid diet; Thin liquids until NGT can be removed and trials of upgraded diet consistency completed under assessment w/ ST services. Recommend aspiration precautions w/ Rest Breaks as needed to lessen WOB/SOB d/t exertion; Pills via NGT at this time. Tray setup and positioning assistance Upright for meals. PMV MUST BE PLACED FOR ALL ORAL INTAKE. Monitoring w/ all oral intake. ST services will continue to f/u w/ pt for toleration of diet, trials to upgrade when ready, and education on precautions while admitted. NSG, Dietician, and MD updated on above. Precautions posted at bedside for PMV wear/use and aspiration precautions. SLP Visit Diagnosis: Dysphagia, unspecified (R13.10) (chronic trach)    Aspiration Risk  Mild aspiration risk;Risk for inadequate nutrition/hydration    Diet Recommendation   Clear liquid diet; Thin liquids until NGT can be removed and trials of upgraded diet consistency completed under assessment w/ ST services. Recommend aspiration precautions w/ Rest Breaks as needed to lessen WOB/SOB d/t exertion; Tray setup and positioning assistance Upright for meals. Monitoring w/ all oral intake. MV MUST BE  PLACED FOR ALL ORAL INTAKE.    Medication Administration: Via alternative means  - Pills via NGT at this time.   Other  Recommendations Recommended Consults:  (Dietician f/u; Palliative Care f/u for GOC) Oral Care Recommendations: Oral care BID;Oral care before and after PO;Patient independent with oral care;Staff/trained caregiver to provide oral care Other Recommendations:  (n/a currently)    Recommendations for follow up therapy are one component of a multi-disciplinary discharge planning process, led by the attending physician.  Recommendations may be updated based on patient status, additional functional criteria and insurance authorization.  Follow up Recommendations  (TBD)      Frequency and Duration min 2x/week  1 week       Prognosis Prognosis for Safe Diet Advancement: Fair Barriers to Reach Goals: Time post onset;Severity of deficits Barriers/Prognosis Comment: must wear PMV during oral intake      Swallow Study   General Date of Onset: 03/20/21 HPI: Pt is a 70 year old female presented to [facility] ER via EMS from home on 09/27 due to unresponsiveness.  Per ER notes EMS reported pts initial GCS was 3 and O2 sats were unreadable.  EMS provided bag ventilation  via tracheostomy en route to the ER.  Pt also received 2 mg of narcan and initially became responsive.  Pts son informed EMS on the scene pts code status DNR, however there was no DNR paperwork readily available on the scene.  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   ED course  Upon arrival to the ER pt unresponsive to verbal/painful stimulation with O2 sats initially in the mid 90's on RA.  However, pt immediately became hypoxic with O2 sats decreasing to 60% with shallow respirations on RA, requiring bag ventilation via tracheostomy.  Due to continued hypoxia pts #6 cuffless  tracheostomy was changed to a #6 cuffed trach, and pt placed on mechanical ventilation via trach with O2 sats increasing to 92%.  Upon removal of cuffless trach RT noted pts inner canula was partially occluded by dried secretions. The patient normally uses the trilogy as a CPAP mask with the tracheostomy capped overnight and is on 4.5 to 5 L nasal cannula during the day.  Pt has had trouble w/ her trach being "clogged before" per report.  Imaging at admit: Trace bilateral pleural effusions.  OF NOTE: Pt wears PMV at baseline at home for verbal communication and when eating/drinking secondary to chronic tracheostomy. Type of Study: Bedside Swallow Evaluation Previous Swallow Assessment: previous BSE -- see chart notes Diet Prior to this Study: NPO;NG Tube (has not been feeling well; just resumed PMV use this admit) Temperature Spikes Noted: No (wb12.0) Respiratory Status: Trach Collar (5L) Trach Size and Type: Cuff;#6;With PMSV in place History of Recent Intubation: No (chronic trach) Behavior/Cognition: Alert;Cooperative;Pleasant mood (anxious - NSG aware) Oral Cavity Assessment: Within Functional Limits Oral Care Completed by SLP: Yes Oral Cavity - Dentition: Missing dentition;Dentures, top Vision: Functional for self-feeding Self-Feeding Abilities: Able to feed self Patient Positioning: Upright in bed (supported more Upright w/ pillows) Baseline Vocal Quality: Low vocal intensity (min gravely - baseline for pt) Volitional Cough: Weak;Congested (min) Volitional Swallow: Able to elicit    Oral/Motor/Sensory Function Overall Oral Motor/Sensory Function: Within functional limits   Ice Chips Ice chips: Within functional limits Presentation: Spoon (fed; 6 trials)   Thin Liquid Thin Liquid: Within functional limits Presentation: Self Fed;Straw (9 trials) Other Comments: stopped to go to bathroom    Nectar Thick Nectar Thick Liquid: Not tested   Honey Thick Honey Thick Liquid: Not tested    Puree Puree: Not tested   Solid     Solid: Not tested        Jerilynn Som, MS, CCC-SLP Speech Language Pathologist Rehab Services [phone] Laurey Salser 03/28/2021,2:39 PM

## 2021-03-28 NOTE — Plan of Care (Signed)

## 2021-03-29 DIAGNOSIS — F411 Generalized anxiety disorder: Secondary | ICD-10-CM | POA: Diagnosis not present

## 2021-03-29 DIAGNOSIS — Z7189 Other specified counseling: Secondary | ICD-10-CM | POA: Diagnosis not present

## 2021-03-29 LAB — BASIC METABOLIC PANEL
Anion gap: 8 (ref 5–15)
BUN: 21 mg/dL (ref 8–23)
CO2: 34 mmol/L — ABNORMAL HIGH (ref 22–32)
Calcium: 8.9 mg/dL (ref 8.9–10.3)
Chloride: 97 mmol/L — ABNORMAL LOW (ref 98–111)
Creatinine, Ser: 0.61 mg/dL (ref 0.44–1.00)
GFR, Estimated: >60 mL/min (ref >60)
Glucose, Bld: 118 mg/dL — ABNORMAL HIGH (ref 70–99)
Potassium: 3.5 mmol/L (ref 3.5–5.1)
Sodium: 139 mmol/L (ref 135–145)

## 2021-03-29 LAB — CBC WITH DIFFERENTIAL/PLATELET
Abs Immature Granulocytes: 0.11 10*3/uL — ABNORMAL HIGH (ref 0.00–0.07)
Basophils Absolute: 0 10*3/uL (ref 0.0–0.1)
Basophils Relative: 0 %
Eosinophils Absolute: 0 10*3/uL (ref 0.0–0.5)
Eosinophils Relative: 0 %
HCT: 28.5 % — ABNORMAL LOW (ref 36.0–46.0)
Hemoglobin: 8.8 g/dL — ABNORMAL LOW (ref 12.0–15.0)
Immature Granulocytes: 1 %
Lymphocytes Relative: 10 %
Lymphs Abs: 1.4 10*3/uL (ref 0.7–4.0)
MCH: 29.6 pg (ref 26.0–34.0)
MCHC: 30.9 g/dL (ref 30.0–36.0)
MCV: 96 fL (ref 80.0–100.0)
Monocytes Absolute: 1.6 10*3/uL — ABNORMAL HIGH (ref 0.1–1.0)
Monocytes Relative: 11 %
Neutro Abs: 11 10*3/uL — ABNORMAL HIGH (ref 1.7–7.7)
Neutrophils Relative %: 78 %
Platelets: 454 10*3/uL — ABNORMAL HIGH (ref 150–400)
RBC: 2.97 MIL/uL — ABNORMAL LOW (ref 3.87–5.11)
RDW: 16 % — ABNORMAL HIGH (ref 11.5–15.5)
WBC: 14.1 10*3/uL — ABNORMAL HIGH (ref 4.0–10.5)
nRBC: 0 % (ref 0.0–0.2)

## 2021-03-29 LAB — MAGNESIUM: Magnesium: 2 mg/dL (ref 1.7–2.4)

## 2021-03-29 LAB — PHOSPHORUS: Phosphorus: 3.9 mg/dL (ref 2.5–4.6)

## 2021-03-29 MED ORDER — BENZOCAINE 20 % MT AERO
INHALATION_SPRAY | Freq: Three times a day (TID) | OROMUCOSAL | Status: DC | PRN
  Administered 2021-03-29 – 2021-03-31 (×5): 1 via OROMUCOSAL
  Filled 2021-03-29 (×5): qty 57

## 2021-03-29 MED ORDER — CHLORDIAZEPOXIDE HCL 5 MG PO CAPS
10.0000 mg | ORAL_CAPSULE | Freq: Three times a day (TID) | ORAL | Status: DC
  Administered 2021-03-29 – 2021-03-30 (×4): 10 mg via ORAL
  Filled 2021-03-29 (×4): qty 2

## 2021-03-29 MED ORDER — ALPRAZOLAM 0.5 MG PO TABS
0.5000 mg | ORAL_TABLET | Freq: Three times a day (TID) | ORAL | Status: DC
  Administered 2021-03-29: 0.5 mg via ORAL
  Filled 2021-03-29: qty 1

## 2021-03-29 NOTE — Progress Notes (Signed)
NAME:  Virginia [name], MRN:  [id number], DOB:  03/18/1951, LOS: 9 ADMISSION DATE:  03/20/2021, CONSULTATION DATE: 03/20/2021 REFERRING MD: Dr. Vicente Males, CHIEF COMPLAINT: Unresponsiveness   History of Present Illness:  This is a 70 yo female with end stage COPD and chronic tracheostomy who presented to [facility] ER via EMS from home on 09/27 due to unresponsiveness.  Per ER notes EMS reported pts initial GCS was 3 and O2 sats were unreadable.  EMS provided bag ventilation via tracheostomy en route to the ER.  Pt also received 2 mg of narcan and initially became responsive.  Pts son informed EMS on the scene pts code status DNR, however there was no DNR paperwork readily available on the scene.  Pt recently hospitalized at [facility] from 08/28~09/19 following treatment of acute on chronic hypoxic hypercapnic respiratory failure secondary to AECOPD and enterobacter aerogenes bronchitis.    ED course Upon arrival to the ER pt unresponsive to verbal/painful stimulation with O2 sats initially in the mid 90's on RA.  However, pt immediately became hypoxic with O2 sats decreasing to 60% with shallow respirations on RA, requiring bag ventilation via tracheostomy.  Due to continued hypoxia pts #6 cuffless tracheostomy was changed to a #6 cuffed trach, and pt placed on mechanical ventilation via trach with O2 sats increasing to 92%.  Upon removal of cuffless trach RT noted pts inner canula was partially occluded by dried secretions.  VBG revealed pH 7.15/pCO2 118/acid-base excess 9.2/bicarb 41.1.  CXR concerning for bilateral trace pleural effusions and emphysema.  Code sepsis activated by ER physician pt received 1L LR bolus, ceftriaxone, azithromycin, and vancomycin due to concern of pneumonia.  PCCM team contacted for ICU admission.   Pertinent  Medical History  Atrial Fibrillation on Eliquis C. Difficile Severe COPD Chronic tracheostomy Anxiety & depression Hepatitis  C Arthritis HTN Fibromyalgia Anemia CAD Thoracic AAA  Significant Hospital Events: Including procedures, antibiotic start and stop dates in addition to other pertinent events   09/27: Pt admitted to ICU with acute on chronic hypoxic hypercapnic respiratory failure secondary to end stage COPD requiring mechanical ventilation via chronic tracheostomy  09/29: Tolerating PSV, will attempt TCT as tolerated 09/30: Unable to tolerate weaning pressures on PSV (immediately becomes extremely anxious and SOB with hypooxia), add scheduled Valium, plan to place Dobhoff for enteral access 10/2: Tolerating trach collars trials at 35%.  10/3: Eliquis restarted 10/1 after no plans for GI procedure and no further evidence of GI bleeding with hgb stable >8.0 10/4: No acute events overnight. Benzos adjusted yesterday per psychiatry, IV Valium and IV morphine discontinued.  10/5: Frequent episodes of diarrhea, continues on tube feeds. SLP eval today for po intake assessment.  Palliative asessment with hospice evaluation per patient request 03/29/21 - patient discussing hospice with palliative care and having family meeting for goals of care.  She had VT sustained and this responded well to beta blocker x metoprolol x1.  She reports severe anxiety and asking for benzodiazepine.  She is being followed by [facility] for end of life care.    Objective   Blood pressure 114/83, pulse (!) 102, temperature 97.6 F (36.4 C), temperature source Axillary, resp. rate (!) 21, height 5' (1.524 m), weight 47.8 kg, SpO2 96 %.    Vent Mode: PRVC FiO2 (%):  [28 %] 28 % Set Rate:  [14 bmp] 14 bmp Vt Set:  [450 mL] 450 mL PEEP:  [5 cmH20] 5 cmH20   Intake/Output Summary (Last 24 hours) at 03/29/2021 0839 Last  data filed at 03/29/2021 0500 Gross per 24 hour  Intake 1585 ml  Output 725 ml  Net 860 ml    Filed Weights   03/27/21 0423 03/28/21 0434 03/29/21 0345  Weight: 50.9 kg 49.1 kg 47.8 kg   Examination: General:  chronically ill appearing frail female, NAD, mechanically ventilated via chronic tracheostomy HENT: #6 [place]  midline chronic tracheostomy, no JVD  Lungs: mechanical breath sounds throughout, no wheezing or rales noted, synchronous with vent, even, non labored  Cardiovascular: Tachycardia, regular rhythm, no R/G, 2+ radial/1+ distal pulses trace bilateral lower extremity edema  Abdomen: +BS x4, soft, non tender, non distended, bowel sounds active x4 quadrants Extremities: normal tone, no edema  Neuro: awake, alert, following commands, no focal deficits, pupils PERRL GU: External catheter in place  Resolved Hospital Problem list   Lactic acidosis  Assessment & Plan:   Acute on chronic hypoxic hypercapnic respiratory failure secondary to AECOPD and mucous plugging & questionable underlying pneumonia Enterobacter 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 03/21/21 Mechanical ventilation via chronic tracheostomy  Hx: Chronic Home O2 @4 .5~5L during the day and trilogy with capped tracheostomy qhs -Trach collar trials during the day with vent as needed at night -Plateau pressures less than 30 cm H20 -Wean FiO2 & PEEP as tolerated to maintain O2 sats >88% - Ceftazidime started 10/1    - s/p azithromycin, ceftriaxone 9/28, one time dose vanco, ceftriaxone, azithromycin 9/27 -Follow intermittent Chest X-ray & ABG as needed -Attempt SBT's and trach collar trials as tolerated -May require ventilator support qhs -Implement VAP Bundle -Scheduled & Prn Bronchodilators -IV and Nebulized steroids - Ensure adequate pulmonary hygiene   Mildly elevated troponin likely secondary to demand ischemia  Chronic atrial fibrillation on Eliquis SVT  Hx: HTN and CAD  -Continuous telemetry monitoring  -Eliquis restarted 10/1 after GI cleared  -Continue Metoprolol, Cardizem, and Lisinopril as BP and renal functions permits -TSH low, but Free T4 normal -Prn metoprolol for  hr >115   Acute Blood Loss anemia due to GI Bleed superimposed on Anemia of Chronic Disease, stable Patient with dark tarry stools POA that were Hemoccult positive Hx: Anemia -Monitor for S/Sx of bleeding -Trend CBC - hgb stable > 8.0, patient with hx chronic anemia -SCD's for VTE Prophylaxis, Eliquis restarted 10/1 -Transfuse for Hgb <7 -Pantoprazole 40mg  IV BID -Hold NSAIDs, Anticoagulation, ASA -GI evaluated ~ patient declined EGD, recommends colonoscopy either inpatient or outpatient if pt is agreeable, ok to restart Eliquis and start diet once enteral access obtained, GI signed off 9/29  Acute encephalopathy secondary to hypercapnia >>resolved Anxiety/Panic Attacks with weaning Hx: Anxiety and depression  Benzodiazepine dependency - Maintain RASS goal 0 to -1 - Continue home po meds: diazepam, gabapentin, Seroquel, amitriptyline    - Diazepam tablets increased to 7.5 mg q8h, per tube per psychiatry    - Hydroxyzine 25mg  TID prn per tube added 10/3 - IV Valium and IV morphine DC 10/3 - Monitor EKG, on Seroquel - Minimize use of IV sedatives - Urine drug screen positive for Benzodiazepines - Evaluated by psychiatry, appreciate input  - CT head negative for acute intracranial abnormality  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 Could tolerate po intake however in very small amounts, poor nutritional status on po intake alone.   Elevated liver enzymes likely secondary to hepatitis  C hx>>resolved -Trend hepatic function panel prn  Lactic acidosis>>resolved -Trend BMP and lactic acid prn  Best Practice (right click and "Reselect all SmartList Selections" daily)   Diet/type: NPO, tube feeds  DVT prophylaxis: SCD's, Eliquis restarted 10/1 GI prophylaxis: PPI Lines: N/A Foley: N/A Code Status:   full code Last date of multidisciplinary goals of care discussion [03/28/2021]   Labs   CBC: Recent Labs  Lab 03/24/21 0321 03/25/21 0547 03/26/21 0523 03/27/21 0608 03/29/21 0347  WBC 9.1 8.6 10.6* 12.0* 14.1*  NEUTROABS  --   --   --  8.6* 11.0*  HGB 8.2* 8.1* 8.2* 8.0* 8.8*  HCT 25.4* 25.1* 26.2* 25.4* 28.5*  MCV 94.4 95.4 96.7 96.2 96.0  PLT 341 364 388 384 454*     Basic Metabolic Panel: Recent Labs  Lab 03/24/21 0321 03/25/21 0547 03/26/21 0523 03/27/21 0608 03/29/21 0347  NA 134* 135 137 137 139  K 3.2* 3.8 3.6 3.6 3.5  CL 90* 94* 96* 96* 97*  CO2 37* 36* 35* 34* 34*  GLUCOSE 116* 108* 102* 99 118*  BUN 17 19 20 20 21   CREATININE 0.68 0.69 0.66 0.51 0.61  CALCIUM 8.6* 8.2* 8.3* 8.6* 8.9  MG 1.7 2.3 2.0 1.9 2.0  PHOS 2.8 3.1 3.4 3.6 3.9    GFR: Estimated Creatinine Clearance: 47 mL/min (by C-G formula based on SCr of 0.61 mg/dL). Recent Labs  Lab 03/25/21 0547 03/26/21 0523 03/27/21 0608 03/29/21 0347  WBC 8.6 10.6* 12.0* 14.1*     Liver Function Tests: Recent Labs  Lab 03/23/21 1337 03/24/21 0321 03/25/21 0547  AST 27 29 23   ALT 30 29 26   ALKPHOS 29* 32* 28*  BILITOT 0.9 0.5 0.8  PROT 5.9* 5.3* 5.1*  ALBUMIN 3.3* 2.9* 2.8*    No results for input(s): LIPASE, AMYLASE in the last 168 hours. No results for input(s): AMMONIA in the last 168 hours.  ABG    Component Value Date/Time   PHART 7.50 (H) 03/21/2021 0525   PCO2ART 40 03/21/2021 0525   PO2ART 120 (H) 03/21/2021 0525   HCO3 31.2 (H) 03/21/2021 0525   O2SAT 99.0 03/21/2021 0525     Coagulation Profile: No results for input(s): INR, PROTIME in the last 168 hours.   Cardiac Enzymes: No results for input(s): CKTOTAL, CKMB, CKMBINDEX, TROPONINI in the last 168 hours.  HbA1C: Hgb A1c MFr Bld  Date/Time Value Ref Range Status  01/08/2021 06:28 AM 4.9 4.8 - 5.6 % Final    Comment:    (NOTE) Pre diabetes:          5.7%-6.4%  Diabetes:              >6.4%  Glycemic  control for   <7.0% adults with diabetes   06/06/2020 12:07 PM 5.3 4.8 - 5.6 % Final    Comment:    (NOTE) Pre diabetes:          5.7%-6.4%  Diabetes:              >6.4%  Glycemic control for   <7.0% adults with diabetes     CBG: Recent Labs  Lab 03/26/21 0726 03/26/21 1134 03/26/21 1532 03/26/21 1925 03/26/21 2318  GLUCAP 105* 119* 158* 116* 96    Past Medical History:  She,  has a past medical history of Anemia, Anxiety, Arthritis, Atrial fibrillation (HCC), C. difficile colitis (09/2015), COPD (chronic obstructive pulmonary disease) (HCC), Depression, Dyspnea, Dysrhythmia, Fibromyalgia, H/O tracheostomy, Hep C w/ coma, chronic, Hypertension, MRSA pneumonia (  HCC) (2017), On home oxygen therapy, Osteoporosis, Peripheral neuropathy, RLS (restless legs syndrome), S/P percutaneous endoscopic gastrostomy (PEG) tube placement (HCC) (09/2015), and Ventilator associated pneumonia (HCC) (10/2015).   Surgical History:   Past Surgical History:  Procedure Laterality Date   ABDOMINAL HYSTERECTOMY     BREAST SURGERY Bilateral    Breast Implants   DILATION AND CURETTAGE OF UTERUS     PEG PLACEMENT N/A 06/21/2020   Procedure: PERCUTANEOUS ENDOSCOPIC GASTROSTOMY (PEG) PLACEMENT;  Surgeon: Regis Bill, MD;  Location: [facility] ENDOSCOPY;  Service: Endoscopy;  Laterality: N/A;   REVERSE SHOULDER ARTHROPLASTY Right 03/06/2017   Procedure: REVERSE SHOULDER ARTHROPLASTY;  Surgeon: Christena Flake, MD;  Location: [facility] ORS;  Service: Orthopedics;  Laterality: Right;   ROTATOR CUFF REPAIR Bilateral    TRACHEOSTOMY TUBE PLACEMENT N/A 06/15/2020   Procedure: TRACHEOSTOMY;  Surgeon: Geanie Logan, MD;  Location: [facility] ORS;  Service: ENT;  Laterality: N/A;     Social History:   reports that she quit smoking about 5 years ago. Her smoking use included cigarettes. She smoked an average of 1.5 packs per day. She has never used smokeless tobacco. She reports that she does not drink alcohol and does  not use drugs.   Family History:  Her family history includes Hypertension in her mother.   Allergies No Known Allergies   Home Medications  Prior to Admission medications   Medication Sig Start Date End Date Taking? Authorizing Provider  albuterol (VENTOLIN HFA) 108 (90 Base) MCG/ACT inhaler Inhale 2 puffs into the lungs every 4 (four) hours as needed for wheezing or shortness of breath.    [provider]  amitriptyline (ELAVIL) 25 MG tablet Take 25 mg by mouth at bedtime.  01/14/19   [provider]  apixaban (ELIQUIS) 5 MG TABS tablet Take 1 tablet (5 mg total) by mouth 2 (two) times daily. 11/10/16   Adrian Saran, MD  budesonide (PULMICORT) 0.5 MG/2ML nebulizer solution Take 0.5 mg by nebulization daily. 11/28/20   [provider]  diazepam (VALIUM) 5 MG tablet Take 1-1.5 tablets (5-7.5 mg total) by mouth every 8 (eight) hours as needed for anxiety. 03/12/21   Osvaldo Shipper, MD  diltiazem (CARDIZEM CD) 120 MG 24 hr capsule Take 120 mg by mouth daily. 04/29/19   [provider]  estradiol (ESTRACE) 1 MG tablet Take 1 mg by mouth daily. 09/10/16   [provider]  gabapentin (NEURONTIN) 300 MG capsule Take 300 mg by mouth 3 (three) times daily. 05/06/20   [provider]  ipratropium-albuterol (DUONEB) 0.5-2.5 (3) MG/3ML SOLN Take 3 mLs by nebulization every 6 (six) hours. 02/23/21   Salena Saner, MD  iron polysaccharides (NIFEREX) 150 MG capsule Take 1 capsule (150 mg total) by mouth daily. 01/12/21 04/12/21  Gillis Santa, MD  MAGNESIUM-OXIDE 400 (241.3 Mg) MG tablet Take 400 mg by mouth daily.  08/24/16   [provider]  methocarbamol (ROBAXIN) 500 MG tablet Take 500 mg by mouth 2 (two) times daily. 12/27/20   [provider]  metoprolol tartrate (LOPRESSOR) 25 MG tablet Take 1 tablet (25 mg total) by mouth 2 (two) times daily. 03/12/21 06/10/21  Osvaldo Shipper, MD  omeprazole (PRILOSEC) 40 MG capsule Take 40 mg by mouth  daily. 04/16/19   [provider]  predniSONE (DELTASONE) 5 MG tablet Take 5 mg by mouth daily. 12/25/20   [provider]  QUEtiapine (SEROQUEL) 25 MG tablet Take 25 mg by mouth at bedtime.  10/01/16   [provider]  rosuvastatin (CRESTOR) 10 MG tablet Take 10 mg by mouth at bedtime. 10/23/20   [provider]  vitamin B-12 100 MCG tablet Take 1 tablet (100 mcg total) by mouth daily. 01/12/21 04/12/21  Gillis Santa, MD  Vitamin D, Ergocalciferol, (DRISDOL) 1.25 MG (50000 UNIT) CAPS capsule Take 1 capsule (50,000 Units total) by mouth every 7 (seven) days. 01/17/21 04/17/21  Gillis Santa, MD    Scheduled Meds:  amitriptyline  25 mg Per Tube QHS   apixaban  5 mg Per Tube BID   budesonide (PULMICORT) nebulizer solution  0.25 mg Nebulization BID   chlorhexidine  15 mL Mouth Rinse BID   Chlorhexidine Gluconate Cloth  6 each Topical Q0600   diazepam  7.5 mg Per Tube Q8H   docusate  100 mg Per Tube BID   free water  60 mL Per Tube Q4H   gabapentin  300 mg Per Tube TID   ipratropium-albuterol  3 mL Nebulization Q6H   iron polysaccharides  150 mg Per Tube Daily   lidocaine  1 patch Transdermal Q24H   magnesium oxide  400 mg Per Tube Daily   mouth rinse  15 mL Mouth Rinse q12n4p   methocarbamol  500 mg Per Tube BID   methylPREDNISolone (SOLU-MEDROL) injection  20 mg Intravenous Q24H   metoprolol tartrate  25 mg Per Tube BID   pantoprazole sodium  40 mg Per Tube BID   polyethylene glycol  17 g Per Tube Daily   QUEtiapine  25 mg Per Tube QHS   rosuvastatin  10 mg Per Tube QHS   sodium chloride flush  10-40 mL Intracatheter Q12H   cyanocobalamin  100 mcg Per Tube Daily   Continuous Infusions:  sodium chloride     cefTAZidime (FORTAZ)  IV 2 g (03/28/21 1702)   feeding supplement (VITAL AF 1.2 CAL) 1,000 mL (03/28/21 2128)   PRN Meds:.docusate, hydrOXYzine, ipratropium-albuterol, metoprolol tartrate, phenol, sodium chloride flush, white petrolatum  Critical  care time: 35 minutes       Critical care provider statement:   Total critical care time: 33 minutes   Performed by: Karna Christmas MD   Critical care time was exclusive of separately billable procedures and treating other patients.   Critical care was necessary to treat or prevent imminent or life-threatening deterioration.   Critical care was time spent personally by me on the following activities: development of treatment plan with patient and/or surrogate as well as nursing, discussions with consultants, evaluation of patient's response to treatment, examination of patient, obtaining history from patient or surrogate, ordering and performing treatments and interventions, ordering and review of laboratory studies, ordering and review of radiographic studies, pulse oximetry and re-evaluation of patient's condition.    Vida Rigger, M.D.  Pulmonary & Critical Care Medicine

## 2021-03-29 NOTE — Progress Notes (Signed)
Daily Progress Note   Patient Name: Virginia [name]       Date: 03/29/2021 DOB: March 25, 1951  Age: 70 y.o. MRN#: [id number] Attending Physician: Vida Rigger, MD Primary Care Physician: Marguarite Arbour, MD Admit Date: 03/20/2021  Reason for Consultation/Follow-up: Establishing goals of care  Subjective: Spoke with patient at bedside. She states she still wants to go home with hospice and die peacefully and naturally with her dog at bedside. She states she has not spoken with her son, and would like for me to call him.  Called to speak with son. He voices concern that she used to be dependent on narcotic medication. He states he requested last admission that she not be given Norco, but she was given it anyway, and "she likes that feeling", and now just wants to die because she can not get it. He states he does not want her to have narcotics because they increase CO2. He states he is okay with the Valium because it does not increase CO2. Discussed her status and SOB. He states they have been advised for years her prognosis is poor, and is limited. We discussed disease progression, and the purpose of narcotic medication to help with SOB. He states "I don't understand the quality of life conversation. Life is life".  He states he believes when it is her time to die, God will take her. He states he believes she is depressed because he has not been able to see her over the past 4 days. He feels if he comes to visit, she will revert to wishes for full code/full scope. He states he will come to talk to her. Son confirms it is her choice to make, but wants to speak with her. Of note, he does state going home with hospice is not an option as she will not have 24/7 assistance.  Returned to talk to patient.  Son's wife was at bedside upon my return. Patient confirms her wishes for comfort focused care and hospice. DIL discusses that home with hospice is not an option. Patient is clear she does not want rehab. She states she does not want to live in a nursing home, which per family sounds like the upcoming plans. She discusses poor and unacceptable QOL, and  states if she cannot go home with hospice, she would want  hospice facility placement. She states she would want to forgo her trilogy, and would like comfort focused care for the time she has. She states she is tired and done. She is aware that without trilogy, her prognosis would likely be days. DIL supported her in this and voiced understanding. DIL discusses with patient the conversation needed with son.   Son arrived to bedside. I stepped out so they could talk.       Length of Stay: 9  Current Medications: Scheduled Meds:  . amitriptyline  25 mg Per Tube QHS  . apixaban  5 mg Per Tube BID  . budesonide (PULMICORT) nebulizer solution  0.25 mg Nebulization BID  . chlordiazePOXIDE  10 mg Oral TID  . chlorhexidine  15 mL Mouth Rinse BID  . Chlorhexidine Gluconate Cloth  6 each Topical Q0600  . docusate  100 mg Per Tube BID  . free water  60 mL Per Tube Q4H  . gabapentin  300 mg Per Tube TID  . ipratropium-albuterol  3 mL Nebulization Q6H  . iron polysaccharides  150 mg Per Tube Daily  . lidocaine  1 patch Transdermal Q24H  . magnesium oxide  400 mg Per Tube Daily  . mouth rinse  15 mL Mouth Rinse q12n4p  . methocarbamol  500 mg Per Tube BID  . methylPREDNISolone (SOLU-MEDROL) injection  20 mg Intravenous Q24H  . metoprolol tartrate  25 mg Per Tube BID  . pantoprazole sodium  40 mg Per Tube BID  . polyethylene glycol  17 g Per Tube Daily  . QUEtiapine  25 mg Per Tube QHS  . rosuvastatin  10 mg Per Tube QHS  . sodium chloride flush  10-40 mL Intracatheter Q12H  . cyanocobalamin  100 mcg Per Tube Daily    Continuous Infusions: .  sodium chloride    . feeding supplement (VITAL AF 1.2 CAL) 1,000 mL (03/28/21 2128)    PRN Meds: docusate, hydrOXYzine, ipratropium-albuterol, metoprolol tartrate, phenol, sodium chloride flush, white petrolatum  Physical Exam Pulmonary:     Comments: On TC with PMV Neurological:     Mental Status: She is alert.            Vital Signs: BP (!) 136/91 (BP Location: Right Arm)   Pulse 95   Temp 99 F (37.2 C) (Oral)   Resp (!) 22   Ht 5' (1.524 m)   Wt 47.8 kg   SpO2 98%   BMI 20.58 kg/m  SpO2: SpO2: 98 % O2 Device: O2 Device: Tracheostomy Collar O2 Flow Rate: O2 Flow Rate (L/min): 5 L/min  Intake/output summary:  Intake/Output Summary (Last 24 hours) at 03/29/2021 1245 Last data filed at 03/29/2021 0900 Gross per 24 hour  Intake 1345 ml  Output 925 ml  Net 420 ml   LBM: Last BM Date: 03/29/21 Baseline Weight: Weight: 54 kg Most recent weight: Weight: 47.8 kg         Patient Active Problem List   Diagnosis Date Noted  . Generalized anxiety disorder 03/26/2021  . Acute on chronic respiratory failure with hypoxemia (HCC) 03/20/2021  . Pneumonia due to enterobacter aerogenes (HCC) 02/23/2021  . Acute respiratory failure (HCC) 02/18/2021  . SVT (supraventricular tachycardia) (HCC) 02/18/2021  . Malnutrition of moderate degree 01/08/2021  . On mechanically assisted ventilation (HCC) 01/07/2021  . Hyperlipidemia 01/07/2021  . Anxiety 01/07/2021  . Acute on chronic respiratory failure (HCC) 01/06/2021  . Anorexia 08/04/2020  . Severe protein-calorie malnutrition (HCC) 08/04/2020  . Pressure injury of skin  07/14/2020  . Diarrhea   . Reactive thrombocytosis 07/12/2020  . Aspiration pneumonia of both lower lobes due to gastric secretions (HCC) 07/07/2020  . Acute urinary retention 07/06/2020  . Acute metabolic encephalopathy 07/06/2020  . Acute on chronic respiratory failure with hypoxia and hypercapnia (HCC) 07/06/2020  . COPD exacerbation (HCC)   . Respiratory  failure (HCC) 06/06/2020  . Acute respiratory failure with hypoxia (HCC) 06/02/2020  . COPD with acute exacerbation (HCC) 06/02/2020  . Shortness of breath 06/02/2020  . GERD (gastroesophageal reflux disease) 06/02/2020  . Iron deficiency anemia 01/22/2020  . Anemia of chronic disease 01/17/2019  . Coronary artery disease involving native coronary artery of native heart 04/30/2018  . Palpitations 04/30/2018  . Chronic heartburn 04/12/2018  . Dysphagia 04/12/2018  . Thoracic aortic aneurysm without rupture 08/27/2017  . 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 SOBOE (shortness of breath on exertion) 01/24/2017  . AF (paroxysmal atrial fibrillation) (HCC) 11/09/2016  . Chronic hypoxemic respiratory failure (HCC) 12/06/2015  . Depression 12/06/2015  . Fibromyalgia 12/06/2015  . Hep C w/o coma, chronic (HCC) 12/06/2015  . History of tracheostomy 12/06/2015  . Hypertension 12/06/2015  . Localized osteoporosis without current pathological fracture 12/06/2015  . PEG (percutaneous endoscopic gastrostomy) status (HCC) 12/06/2015  . Peripheral neuropathy, idiopathic 12/06/2015  . RLS (restless legs syndrome) 12/06/2015  . 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  . COPD with acute lower respiratory infection (HCC) 11/29/2015    Palliative Care Assessment & Plan     Recommendations/Plan: Patient would like hospice facility placement, but will talk to son this evening before making official decisions  Code Status:    Code Status Orders  (From admission, onward)           Start     Ordered   03/20/21 2048  Full code  Continuous        03/20/21 2049           Code Status History     Date Active Date Inactive Code Status Order ID Comments User Context   02/18/2021 2054 03/13/2021 0140 Full Code [id number]  Rust-Chester, Cecelia Byars, NP ED   01/06/2021 2058 01/12/2021 0049 Full Code [id number]  Rust-Chester, Cecelia Byars, NP ED   06/06/2020 1434  08/12/2020 0046 Full Code [id number]  Erin Fulling, MD ED   06/02/2020 2233 06/04/2020 2138 Full Code [id number]  Angie Fava, DO ED   03/06/2017 1238 03/07/2017 2049 Full Code [id number]  Poggi, Excell Seltzer, MD Inpatient   11/09/2016 2235 11/10/2016 1625 Full Code [id number]  Auburn Bilberry, MD Inpatient       Prognosis:  < 2 weeks without trilogy.     Thank you for allowing the Palliative Medicine Team to assist in the care of this patient.   Time In: 12:10 Time Out: 1:30 Total Time 1 hour 20 min Prolonged Time Billed  yes       Greater than 50%  of this time was spent counseling and coordinating care related to the above assessment and plan.  Morton Stall, NP  Please contact Palliative Medicine Team phone at [phone] for questions and concerns.      Hospitalized and come home.

## 2021-03-29 NOTE — Progress Notes (Signed)
Spoke with patient at length about her wishes. Patient does not want to die in the hospital and would like to be back at home with her dog. Patient would like to discuss with palliative and have them facilitate a conversation with her son about her wishes. Patient is concerned that her son will not be understanding of her wishes not to transfer to a rehab facility.

## 2021-03-29 NOTE — Progress Notes (Signed)
Patient had a 12 beat run of Vtach, asymptomatic. Sleeping on vent via trach. Virginia Ivan, NP made aware. Will continue to monitor.

## 2021-03-29 NOTE — Consult Note (Signed)
Consultation Note Date: 03/29/2021   Patient Name: Virginia [name]  DOB: May 31, 1951  MRN: [id number]  Age / Sex: 70 y.o., female  PCP: Marguarite Arbour, MD Referring Physician: Vida Rigger, MD  Reason for Consultation: Establishing goals of care  HPI/Patient Profile: This is a 70 yo female with end stage COPD and chronic tracheostomy who presented to [facility] via EMS from home on 09/27 due to unresponsiveness.  Per ER notes EMS reported pts initial GCS was 3 and O2 sats were unreadable.  EMS provided bag ventilation via tracheostomy en route to the ER.  Pt also received 2 mg of narcan and initially became responsive.  Pts son informed EMS on the scene pts code status DNR, however there was no DNR paperwork readily available on the scene.   Clinical Assessment and Goals of Care: Patient is known to PMT from previous admissions. Today she states she is tired. She states she wants to please her family, but she is ready to stop. She states she is tired of suffering. She does not want to go to rehab. She asks questions about hospice care.  She discusses concern for her family's response to this information. Discussed meeting with her, and our talking with them tomorrow.     SUMMARY OF RECOMMENDATIONS   Will meet with family tomorrow .      Primary Diagnoses: Present on Admission:  Acute on chronic respiratory failure with hypoxemia (HCC)   I have reviewed the medical record, interviewed the patient and family, and examined the patient. The following aspects are pertinent.  Past Medical History:  Diagnosis Date   Anemia    Anxiety    Arthritis    Atrial fibrillation (HCC)    C. difficile colitis 09/2015   COPD (chronic obstructive pulmonary disease) (HCC)    Depression    Dyspnea    Dysrhythmia    Fibromyalgia    H/O tracheostomy    Hep C w/ coma, chronic    Hypertension    MRSA pneumonia  (HCC) 2017   On home oxygen therapy    3 L/M    Osteoporosis    Peripheral neuropathy    RLS (restless legs syndrome)    S/P percutaneous endoscopic gastrostomy (PEG) tube placement (HCC) 09/2015   Ventilator associated pneumonia (HCC) 10/2015   [facility], [place]   Social History   Socioeconomic History   Marital status: Single    Spouse name: Not on file   Number of children: Not on file   Years of education: Not on file   Highest education level: Not on file  Occupational History   Not on file  Tobacco Use   Smoking status: Former    Packs/day: 1.50    Types: Cigarettes    Quit date: 10/05/2015    Years since quitting: 5.4   Smokeless tobacco: Never  Vaping Use   Vaping Use: Some days   Last attempt to quit: 10/05/2015  Substance and Sexual Activity   Alcohol use: No   Drug  use: No   Sexual activity: Not on file  Other Topics Concern   Not on file  Social History Narrative   Not on file   Social Determinants of Health   Financial Resource Strain: Not on file  Food Insecurity: Not on file  Transportation Needs: Not on file  Physical Activity: Not on file  Stress: Not on file  Social Connections: Not on file   Family History  Problem Relation Age of Onset   Hypertension Mother    Scheduled Meds:  amitriptyline  25 mg Per Tube QHS   apixaban  5 mg Per Tube BID   budesonide (PULMICORT) nebulizer solution  0.25 mg Nebulization BID   chlorhexidine  15 mL Mouth Rinse BID   Chlorhexidine Gluconate Cloth  6 each Topical Q0600   diazepam  7.5 mg Per Tube Q8H   docusate  100 mg Per Tube BID   free water  60 mL Per Tube Q4H   gabapentin  300 mg Per Tube TID   ipratropium-albuterol  3 mL Nebulization Q6H   iron polysaccharides  150 mg Per Tube Daily   lidocaine  1 patch Transdermal Q24H   magnesium oxide  400 mg Per Tube Daily   mouth rinse  15 mL Mouth Rinse q12n4p   methocarbamol  500 mg Per Tube BID   methylPREDNISolone (SOLU-MEDROL) injection  20  mg Intravenous Q24H   metoprolol tartrate  25 mg Per Tube BID   pantoprazole sodium  40 mg Per Tube BID   polyethylene glycol  17 g Per Tube Daily   QUEtiapine  25 mg Per Tube QHS   rosuvastatin  10 mg Per Tube QHS   sodium chloride flush  10-40 mL Intracatheter Q12H   cyanocobalamin  100 mcg Per Tube Daily   Continuous Infusions:  sodium chloride     cefTAZidime (FORTAZ)  IV 2 g (03/29/21 0931)   feeding supplement (VITAL AF 1.2 CAL) 1,000 mL (03/28/21 2128)   PRN Meds:.docusate, hydrOXYzine, ipratropium-albuterol, metoprolol tartrate, phenol, sodium chloride flush, white petrolatum Medications Prior to Admission:  Prior to Admission medications   Medication Sig Start Date End Date Taking? Authorizing Provider  acetaminophen (TYLENOL) 500 MG tablet Take 1,000 mg by mouth every 6 (six) hours as needed for mild pain.   Yes [provider]  apixaban (ELIQUIS) 5 MG TABS tablet Take 1 tablet (5 mg total) by mouth 2 (two) times daily. 11/10/16  Yes Mody, Patricia Pesa, MD  diazepam (VALIUM) 5 MG tablet Take 1-1.5 tablets (5-7.5 mg total) by mouth every 8 (eight) hours as needed for anxiety. 03/12/21  Yes Osvaldo Shipper, MD  estradiol (ESTRACE) 1 MG tablet Take 1 mg by mouth daily. 09/10/16  Yes [provider]  gabapentin (NEURONTIN) 300 MG capsule Take 300 mg by mouth 3 (three) times daily. 05/06/20  Yes [provider]  iron polysaccharides (NIFEREX) 150 MG capsule Take 1 capsule (150 mg total) by mouth daily. 01/12/21 04/12/21 Yes Gillis Santa, MD  MAGNESIUM-OXIDE 400 (241.3 Mg) MG tablet Take 400 mg by mouth daily.  08/24/16  Yes [provider]  methocarbamol (ROBAXIN) 500 MG tablet Take 500 mg by mouth 2 (two) times daily. 12/27/20  Yes [provider]  metoprolol tartrate (LOPRESSOR) 25 MG tablet Take 1 tablet (25 mg total) by mouth 2 (two) times daily. 03/12/21 06/10/21 Yes Osvaldo Shipper, MD  omeprazole (PRILOSEC) 40 MG capsule Take 40 mg by mouth daily.  04/16/19  Yes [provider]  predniSONE (DELTASONE) 5  MG tablet Take 5 mg by mouth daily. 12/25/20  Yes [provider]  QUEtiapine (SEROQUEL) 25 MG tablet Take 25 mg by mouth at bedtime.  10/01/16  Yes [provider]  rosuvastatin (CRESTOR) 10 MG tablet Take 10 mg by mouth at bedtime. 10/23/20  Yes [provider]  vitamin B-12 100 MCG tablet Take 1 tablet (100 mcg total) by mouth daily. 01/12/21 04/12/21 Yes Gillis Santa, MD  albuterol (VENTOLIN HFA) 108 (90 Base) MCG/ACT inhaler Inhale 2 puffs into the lungs every 4 (four) hours as needed for wheezing or shortness of breath.    [provider]  amitriptyline (ELAVIL) 25 MG tablet Take 25 mg by mouth at bedtime.  01/14/19   [provider]  budesonide (PULMICORT) 0.5 MG/2ML nebulizer solution Take 0.5 mg by nebulization daily. 11/28/20   [provider]  diltiazem (CARDIZEM CD) 120 MG 24 hr capsule Take 120 mg by mouth daily. Patient not taking: No sig reported 04/29/19   [provider]  ipratropium-albuterol (DUONEB) 0.5-2.5 (3) MG/3ML SOLN Take 3 mLs by nebulization every 6 (six) hours. 02/23/21   Salena Saner, MD  lisinopril (ZESTRIL) 10 MG tablet Take 10 mg by mouth daily. Patient not taking: No sig reported 03/16/21   [provider]  Vitamin D, Ergocalciferol, (DRISDOL) 1.25 MG (50000 UNIT) CAPS capsule Take 1 capsule (50,000 Units total) by mouth every 7 (seven) days. Patient not taking: No sig reported 01/17/21 04/17/21  Gillis Santa, MD   No Known Allergies Review of Systems  Respiratory:  Positive for shortness of breath.    Physical Exam Pulmonary:     Comments: PMV in place.  Neurological:     Mental Status: She is alert.    Vital Signs: BP (!) 151/82 (BP Location: Right Arm)   Pulse 90   Temp 97.6 F (36.4 C) (Axillary)   Resp 20   Ht 5' (1.524 m)   Wt 47.8 kg   SpO2 97%   BMI 20.58 kg/m  Pain Scale: 0-10 POSS *See Group Information*:  1-Acceptable,Awake and alert Pain Score: 0-No pain   SpO2: SpO2: 97 % O2 Device:SpO2: 97 % O2 Flow Rate: .O2 Flow Rate (L/min): 5 L/min  IO: Intake/output summary:  Intake/Output Summary (Last 24 hours) at 03/29/2021 0953 Last data filed at 03/29/2021 0500 Gross per 24 hour  Intake 1540 ml  Output 725 ml  Net 815 ml    LBM: Last BM Date: 03/28/21 Baseline Weight: Weight: 54 kg Most recent weight: Weight: 47.8 kg         Time In: 4:00 Time Out: 4:30 Time Total: 30 min Greater than 50%  of this time was spent counseling and coordinating care related to the above assessment and plan.  Signed by: Morton Stall, NP   Please contact Palliative Medicine Team phone at [phone] for questions and concerns.  For individual provider: See Loretha Stapler

## 2021-03-29 NOTE — Progress Notes (Signed)
PHARMACY CONSULT NOTE  Pharmacy Consult for Electrolyte Monitoring and Replacement   Recent Labs: Potassium (mmol/L)  Date Value  03/29/2021 3.5   Magnesium (mg/dL)  Date Value  57/90/3833 2.0   Calcium (mg/dL)  Date Value  38/32/9191 8.9   Albumin (g/dL)  Date Value  66/11/43 2.8 (L)   Phosphorus (mg/dL)  Date Value  99/77/4142 3.9   Sodium (mmol/L)  Date Value  03/29/2021 139    Assessment: 70 year old female presented to the ED unresponsive. Patient required mechanical ventilation via chronic trach. Respiratory failure s/t end stage COPD. Patient with dark tarry stools that were hemoccult positive in the ED, concerning for GIB. Pharmacy consult to manage electrolytes.  On Mg oxide 400 mg daily.  On free water 60 ml q4H.   Goal of Therapy:  Electrolytes WNL  Plan:  --No replacement at this time  --Defer frequency of lab monitoring to provider --Continue to follow along  Lowella Bandy, PharmD, BCPS Clinical Pharmacist 03/29/2021 7:08 AM

## 2021-03-30 DIAGNOSIS — Z7189 Other specified counseling: Secondary | ICD-10-CM | POA: Diagnosis not present

## 2021-03-30 DIAGNOSIS — F411 Generalized anxiety disorder: Secondary | ICD-10-CM | POA: Diagnosis not present

## 2021-03-30 LAB — BASIC METABOLIC PANEL
Anion gap: 7 (ref 5–15)
BUN: 25 mg/dL — ABNORMAL HIGH (ref 8–23)
CO2: 35 mmol/L — ABNORMAL HIGH (ref 22–32)
Calcium: 9 mg/dL (ref 8.9–10.3)
Chloride: 98 mmol/L (ref 98–111)
Creatinine, Ser: 0.63 mg/dL (ref 0.44–1.00)
GFR, Estimated: >60 mL/min (ref >60)
Glucose, Bld: 112 mg/dL — ABNORMAL HIGH (ref 70–99)
Potassium: 3.6 mmol/L (ref 3.5–5.1)
Sodium: 140 mmol/L (ref 135–145)

## 2021-03-30 MED ORDER — METOPROLOL TARTRATE 5 MG/5ML IV SOLN: 5.0000 mg | INTRAVENOUS | Status: AC

## 2021-03-30 MED ORDER — METOPROLOL TARTRATE 5 MG/5ML IV SOLN
INTRAVENOUS | Status: AC
  Administered 2021-03-30: 5 mg
  Filled 2021-03-30: qty 5

## 2021-03-30 MED ORDER — PANTOPRAZOLE 2 MG/ML SUSPENSION
40.0000 mg | Freq: Every day | ORAL | Status: DC
  Administered 2021-03-31 – 2021-04-02 (×3): 40 mg
  Filled 2021-03-30 (×3): qty 20

## 2021-03-30 MED ORDER — DIAZEPAM 5 MG PO TABS
7.5000 mg | ORAL_TABLET | Freq: Three times a day (TID) | ORAL | Status: DC
  Administered 2021-03-30 – 2021-03-31 (×4): 7.5 mg
  Filled 2021-03-30 (×4): qty 2

## 2021-03-30 MED ORDER — PREDNISONE 10 MG PO TABS
10.0000 mg | ORAL_TABLET | Freq: Every day | ORAL | Status: DC
  Administered 2021-03-31 – 2021-04-02 (×3): 10 mg via ORAL
  Filled 2021-03-30 (×3): qty 1

## 2021-03-30 NOTE — Progress Notes (Signed)
PT Cancellation Note  Patient Details Name: Virginia [name] MRN: [id number] DOB: 10-05-1950   Cancelled Treatment:     Per chart review...the patient Is going comfort measures. Will sign off per PT protocols.    Rushie Chestnut 03/30/2021, 12:37 PM

## 2021-03-30 NOTE — Progress Notes (Signed)
OT Cancellation Note  Patient Details Name: Virginia [name] MRN: [id number] DOB: 06/21/1951   Cancelled Treatment:    Reason Eval/Treat Not Completed: Other (comment). Per MD, Pt will be going comfort care. OT to SIGN OFF at this time.   Jackquline Denmark, MS, OTR/L , CBIS ascom [phone]  03/30/21, 11:43 AM

## 2021-03-30 NOTE — Progress Notes (Addendum)
[facility] ICU 12 [facility]) Hospital Liaison Note   Received request from Transitions of Care Manager Maryann Alar, RN for family interest in Hospice Home. Visited patient at bedside and spoke with daughter in law [name] to confirm interest and explain services. Patient chart and information reviewed by [facility] physician. Hospice Home eligibility confirmed.    Unfortunately, Hospice Home is not able to offer a room today. Family and [place] Manager aware hospital liaison will follow up tomorrow or sooner if a room becomes available.    Please do not hesitate to call with any hospice related questions.    Thank you for the opportunity to participate in this patient's care.   Bobbie "Einar Gip, RN, BSN [facility] Liaison [phone]

## 2021-03-30 NOTE — TOC Progression Note (Signed)
Transition of Care [facility]) - Progression Note    Patient Details  Name: Virginia [name] MRN: [id number] Date of Birth: [birth year]  Transition of Care [facility]) CM/SW Contact  Hetty Ely, RN Phone Number: 03/30/2021, 3:45 PM  Clinical Narrative: Per Hospice evaluation patient is eligible for the Hospice Home. Unfortunately they do not have a bed available today. Will notify us when a bed is available.         Expected Discharge Plan and Services                                                 Social Determinants of Health (SDOH) Interventions    Readmission Risk Interventions No flowsheet data found.

## 2021-03-30 NOTE — Progress Notes (Signed)
SLP Cancellation Note  Patient Details Name: Virginia [name] MRN: [id number] DOB: 10-16-1950   Cancelled treatment:       Reason Eval/Treat Not Completed:  (chart reviewed). Per review and Palliative Care notes, pt has transitioned to Comfort Care. Pt has a PMV to wear w/ her trach; she has decided upon no feeding tubes. MD to reconsult ST services if any new needs. Recommend following guidelines for PMV wear and general aspiration precautions.     Jerilynn Som, MS, CCC-SLP Speech Language Pathologist Rehab Services [phone] [facility] 03/30/2021, 2:34 PM

## 2021-03-30 NOTE — Progress Notes (Signed)
Called bedside by nursing who reported the patient is refusing a DNR band.  Discussed CODE STATUS with patient, and she made it quite clear that she would like to be a DNR at hospice house but she doesn't want to die here in the hospital. We discussed at length what FULL CODE status entails as far as CPR, ACLS medications and defibrillation.  Patient maintains she wants to stay a FULL CODE while in the hospital and transition to a DNR upon discharge to hospice house.   [name], AGACNP-BC Acute Care Nurse Practitioner [facility]   [phone] / [phone] Please see Amion for pager details.

## 2021-03-30 NOTE — TOC Progression Note (Signed)
Transition of Care [facility]) - Progression Note    Patient Details  Name: Virginia [name] MRN: [id number] Date of Birth: 09-30-1950  Transition of Care [facility]) CM/SW Contact  Hetty Ely, RN Phone Number: 03/30/2021, 9:23 AM  Clinical Narrative:  Lysle Morales notified to assess patient for Hospice facility care in [place].          Expected Discharge Plan and Services                                                 Social Determinants of Health (SDOH) Interventions    Readmission Risk Interventions No flowsheet data found.

## 2021-03-30 NOTE — Progress Notes (Addendum)
Daily Progress Note   Patient Name: Virginia [name]       Date: 03/30/2021 DOB: 1950/07/25  Age: 70 y.o. MRN#: [id number] Attending Physician: Ottie Glazier, MD Primary Care Physician: Idelle Crouch, MD Admit Date: 03/20/2021  Reason for Consultation/Follow-up: Establishing goals of care  Subjective: Patient is resting in bed. She states her son just left. She tells me he has asked her to try to go to rehab for 2 weeks, and then determine plans for comfort care. She states he does not realize the amount of suffering she has gone through. She states she just wants comfort care for what time she has left. She does not want to go to rehab. We discuss her diagnoses and prognosis. She tells me she just wants to go to the hospice facility. Support offered. Hospice liaison in to speak with patient.   ADDENDUM: She would like to continue trilogy at night and other current care to try to prevent dying in the hospital; she is amenable to DNR status at this time. MOST form completed.  I completed a MOST form today and the signed original was placed in the chart. A photocopy was placed in the chart to be scanned into EMR. The patient outlined their wishes for the following treatment decisions:  Cardiopulmonary Resuscitation: Do Not Attempt Resuscitation (DNR/No CPR)  Medical Interventions: Comfort Measures: Keep clean, warm, and dry. Use medication by any route, positioning, wound care, and other measures to relieve pain and suffering. Use oxygen, suction and manual treatment of airway obstruction as needed for comfort. Do not transfer to the hospital unless comfort needs cannot be met in current location.  Antibiotics: No antibiotics (use other measures to relieve symptoms)  IV Fluids: No IV fluids  (provide other measures to ensure comfort)  Feeding Tube: No feeding tube     Length of Stay: 10  Current Medications: Scheduled Meds:  . amitriptyline  25 mg Per Tube QHS  . apixaban  5 mg Per Tube BID  . budesonide (PULMICORT) nebulizer solution  0.25 mg Nebulization BID  . chlordiazePOXIDE  10 mg Oral TID  . chlorhexidine  15 mL Mouth Rinse BID  . Chlorhexidine Gluconate Cloth  6 each Topical Q0600  . docusate  100 mg Per Tube BID  . free water  60 mL Per Tube  Q4H  . gabapentin  300 mg Per Tube TID  . ipratropium-albuterol  3 mL Nebulization Q6H  . iron polysaccharides  150 mg Per Tube Daily  . lidocaine  1 patch Transdermal Q24H  . magnesium oxide  400 mg Per Tube Daily  . mouth rinse  15 mL Mouth Rinse q12n4p  . methocarbamol  500 mg Per Tube BID  . metoprolol tartrate  25 mg Per Tube BID  . [START ON 03/31/2021] pantoprazole sodium  40 mg Per Tube Daily  . [START ON 03/31/2021] predniSONE  10 mg Oral Q breakfast  . QUEtiapine  25 mg Per Tube QHS  . rosuvastatin  10 mg Per Tube QHS  . sodium chloride flush  10-40 mL Intracatheter Q12H  . cyanocobalamin  100 mcg Per Tube Daily    Continuous Infusions: . sodium chloride    . feeding supplement (VITAL AF 1.2 CAL) 1,000 mL (03/28/21 2128)    PRN Meds: Benzocaine, docusate, hydrOXYzine, ipratropium-albuterol, metoprolol tartrate, phenol, sodium chloride flush, white petrolatum  Physical Exam Pulmonary:     Comments: PMV in place.  Neurological:     Mental Status: She is alert.            Vital Signs: BP (!) 147/90   Pulse 90   Temp 98.6 F (37 C) (Oral)   Resp 19   Ht 5' (1.524 m)   Wt 49.9 kg   SpO2 97%   BMI 21.48 kg/m  SpO2: SpO2: 97 % O2 Device: O2 Device: Tracheostomy Collar O2 Flow Rate: O2 Flow Rate (L/min): 5 L/min  Intake/output summary:  Intake/Output Summary (Last 24 hours) at 03/30/2021 1126 Last data filed at 03/30/2021 1110 Gross per 24 hour  Intake 1725 ml  Output 1200 ml  Net 525 ml    LBM: Last BM Date: 03/30/21 Baseline Weight: Weight: 54 kg Most recent weight: Weight: 49.9 kg     Patient Active Problem List   Diagnosis Date Noted  . Generalized anxiety disorder 03/26/2021  . Acute on chronic respiratory failure with hypoxemia ([place]) 03/20/2021  . Pneumonia due to enterobacter aerogenes ([place]) 02/23/2021  . Acute respiratory failure ([place]) 02/18/2021  . SVT (supraventricular tachycardia) ([place]) 02/18/2021  . Malnutrition of moderate degree 01/08/2021  . On mechanically assisted ventilation ([place]) 01/07/2021  . Hyperlipidemia 01/07/2021  . Anxiety 01/07/2021  . Acute on chronic respiratory failure ([place]) 01/06/2021  . Anorexia 08/04/2020  . Severe protein-calorie malnutrition ([place]) 08/04/2020  . Pressure injury of skin 07/14/2020  . Diarrhea   . Reactive thrombocytosis 07/12/2020  . Aspiration pneumonia of both lower lobes due to gastric secretions ([place]) 07/07/2020  . Acute urinary retention 07/06/2020  . Acute metabolic encephalopathy 91/79/1505  . Acute on chronic respiratory failure with hypoxia and hypercapnia ([place]) 07/06/2020  . COPD exacerbation ([place])   . Respiratory failure ([place]) 06/06/2020  . Acute respiratory failure with hypoxia ([place]) 06/02/2020  . COPD with acute exacerbation ([place]) 06/02/2020  . Shortness of breath 06/02/2020  . GERD (gastroesophageal reflux disease) 06/02/2020  . Iron deficiency anemia 01/22/2020  . Anemia of chronic disease 01/17/2019  . Coronary artery disease involving native coronary artery of native heart 04/30/2018  . Palpitations 04/30/2018  . Chronic heartburn 04/12/2018  . Dysphagia 04/12/2018  . Thoracic aortic aneurysm without rupture 08/27/2017  . 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 SOBOE (shortness of breath on exertion) 01/24/2017  . AF (paroxysmal atrial fibrillation) ([place]) 11/09/2016  . Chronic hypoxemic respiratory failure ([place]) 12/06/2015  .  Depression 12/06/2015  .  Fibromyalgia 12/06/2015  . Hep C w/o coma, chronic ([place]) 12/06/2015  . History of tracheostomy 12/06/2015  . Hypertension 12/06/2015  . Localized osteoporosis without current pathological fracture 12/06/2015  . PEG (percutaneous endoscopic gastrostomy) status ([place]) 12/06/2015  . Peripheral neuropathy, idiopathic 12/06/2015  . RLS (restless legs syndrome) 12/06/2015  . Substance abuse in remission ([place]) 12/06/2015  . COPD with acute lower respiratory infection ([place]) 11/29/2015    Palliative Care Assessment & Plan    Recommendations/Plan: Patient wants hospice facility placement. She wants to wait on full comfort care until she gets to hospice.     Code Status:    Code Status Orders  (From admission, onward)           Start     Ordered   03/20/21 2048  Full code  Continuous        03/20/21 2049           Code Status History     Date Active Date Inactive Code Status Order ID Comments User Context   02/18/2021 2054 03/13/2021 0140 Full Code [id number]  Rust-Chester, Huel Cote, NP ED   01/06/2021 2058 01/12/2021 0049 Full Code [id number]  Rust-Chester, Huel Cote, NP ED   06/06/2020 1434 08/12/2020 0046 Full Code [id number]  Flora Lipps, MD ED   06/02/2020 2233 06/04/2020 2138 Full Code [id number]  Rhetta Mura, DO ED   03/06/2017 1238 03/07/2017 2049 Full Code [id number]  Poggi, Marshall Cork, MD Inpatient   11/09/2016 2235 11/10/2016 1625 Full Code [id number]  Dustin Flock, MD Inpatient       Prognosis:  Hours - Days Days without trilogy.     Thank you for allowing the Palliative Medicine Team to assist in the care of this patient.       Total Time 35 min Prolonged Time Billed  no       Greater than 50%  of this time was spent counseling and coordinating care related to the above assessment and plan.  Asencion Gowda, NP  Please contact Palliative Medicine Team phone at [phone] for questions and concerns.

## 2021-03-30 NOTE — Progress Notes (Addendum)
NAME:  Virginia [name], MRN:  [id number], DOB:  [birth year], LOS: 10 ADMISSION DATE:  03/20/2021, CONSULTATION DATE: 03/20/2021 REFERRING MD: Dr. Vicente Males, CHIEF COMPLAINT: Unresponsiveness   History of Present Illness:  This is a 70 yo female with end stage COPD and chronic tracheostomy who presented to [facility] ER via EMS from home on 09/27 due to unresponsiveness.  Per ER notes EMS reported pts initial GCS was 3 and O2 sats were unreadable.  EMS provided bag ventilation via tracheostomy en route to the ER.  Pt also received 2 mg of narcan and initially became responsive.  Pts son informed EMS on the scene pts code status DNR, however there was no DNR paperwork readily available on the scene.  Pt recently hospitalized at [facility] from 08/28~09/19 following treatment of acute on chronic hypoxic hypercapnic respiratory failure secondary to AECOPD and enterobacter aerogenes bronchitis.    ED course Upon arrival to the ER pt unresponsive to verbal/painful stimulation with O2 sats initially in the mid 90's on RA.  However, pt immediately became hypoxic with O2 sats decreasing to 60% with shallow respirations on RA, requiring bag ventilation via tracheostomy.  Due to continued hypoxia pts #6 cuffless tracheostomy was changed to a #6 cuffed trach, and pt placed on mechanical ventilation via trach with O2 sats increasing to 92%.  Upon removal of cuffless trach RT noted pts inner canula was partially occluded by dried secretions.  VBG revealed pH 7.15/pCO2 118/acid-base excess 9.2/bicarb 41.1.  CXR concerning for bilateral trace pleural effusions and emphysema.  Code sepsis activated by ER physician pt received 1L LR bolus, ceftriaxone, azithromycin, and vancomycin due to concern of pneumonia.  PCCM team contacted for ICU admission.   Pertinent  Medical History  Atrial Fibrillation on Eliquis C. Difficile Severe COPD Chronic tracheostomy Anxiety & depression Hepatitis  C Arthritis HTN Fibromyalgia Anemia CAD Thoracic AAA  Significant Hospital Events: Including procedures, antibiotic start and stop dates in addition to other pertinent events   09/27: Pt admitted to ICU with acute on chronic hypoxic hypercapnic respiratory failure secondary to end stage COPD requiring mechanical ventilation via chronic tracheostomy  09/29: Tolerating PSV, will attempt TCT as tolerated 09/30: Unable to tolerate weaning pressures on PSV (immediately becomes extremely anxious and SOB with hypooxia), add scheduled Valium, plan to place [place] for enteral access 10/2: Tolerating trach collars trials at 35%.  10/3: Eliquis restarted 10/1 after no plans for GI procedure and no further evidence of GI bleeding with hgb stable >8.0 10/4: No acute events overnight. Benzos adjusted yesterday per psychiatry, IV Valium and IV morphine discontinued.  10/5: Frequent episodes of diarrhea, continues on tube feeds. SLP eval today for po intake assessment.  Palliative asessment with hospice evaluation per patient request 03/29/21 - patient discussing hospice with palliative care and having family meeting for goals of care.  She had VT sustained and this responded well to beta blocker x metoprolol x1.  She reports severe anxiety and asking for benzodiazepine.  She is being followed by [facility] for end of life care.  10.7/22 - patient was evaluated by [facility] and is in process of getting bed approval for hospice care.    Objective   Blood pressure (!) 142/91, pulse (!) 110, temperature 98.6 F (37 C), temperature source Oral, resp. rate (!) 24, height 5' (1.524 m), weight 49.9 kg, SpO2 95 %.    Vent Mode: PRVC FiO2 (%):  [28 %] 28 % Set Rate:  [14 bmp] 14 bmp Vt Set:  [637  mL] 450 mL PEEP:  [5 cmH20] 5 cmH20   Intake/Output Summary (Last 24 hours) at 03/30/2021 1619 Last data filed at 03/30/2021 1535 Gross per 24 hour  Intake 1761.25 ml  Output 1550 ml  Net 211.25 ml    Filed Weights    03/28/21 0434 03/29/21 0345 03/30/21 0436  Weight: 49.1 kg 47.8 kg 49.9 kg   Examination: General: chronically ill appearing frail female, NAD, mechanically ventilated via chronic tracheostomy HENT: #6 [place]  midline chronic tracheostomy, no JVD  Lungs: mechanical breath sounds throughout, no wheezing or rales noted, synchronous with vent, even, non labored  Cardiovascular: Tachycardia, regular rhythm, no R/G, 2+ radial/1+ distal pulses trace bilateral lower extremity edema  Abdomen: +BS x4, soft, non tender, non distended, bowel sounds active x4 quadrants Extremities: normal tone, no edema  Neuro: awake, alert, following commands, no focal deficits, pupils PERRL GU: External catheter in place  Resolved Hospital Problem list   Lactic acidosis  Assessment & Plan:   Acute on chronic hypoxic hypercapnic respiratory failure secondary to AECOPD and mucous plugging & questionable underlying pneumonia Enterobacter 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 03/21/21 Mechanical ventilation via chronic tracheostomy  Hx: Chronic Home O2 @4 .5~5L during the day and trilogy with capped tracheostomy qhs -Trach collar trials during the day with vent as needed at night -patient is with palliative service for hospice  Mildly elevated troponin likely secondary to demand ischemia  Chronic atrial fibrillation on Eliquis SVT  Hx: HTN and CAD  -Continuous telemetry monitoring  -Eliquis restarted 10/1 after GI cleared  -Continue Metoprolol, Cardizem, and Lisinopril as BP and renal functions permits -TSH low, but Free T4 normal -Prn metoprolol for hr >115   Acute Blood Loss anemia due to GI Bleed superimposed on Anemia of Chronic Disease, stable Patient with dark tarry stools POA that were Hemoccult positive Hx: Anemia -Monitor for S/Sx of bleeding -Trend CBC - hgb stable > 8.0, patient with hx chronic anemia -SCD's for VTE Prophylaxis, Eliquis restarted  10/1 -Transfusion held due to hospice  -Pantoprazole 40mg  IV BID -Hold NSAIDs, Anticoagulation, ASA -GI evaluated ~ patient declined EGD,   Recurrent encephalopathy due to hypercapnia >>resolved Anxiety/Panic Attacks with weaning Hx: Anxiety and depression  Benzodiazepine dependency - Maintain RASS goal 0 to -1 - Continue home po meds: diazepam, gabapentin, Seroquel, amitriptyline    - Diazepam tablets increased to 7.5 mg q8h, per tube per psychiatry    - Hydroxyzine 25mg  TID prn per tube added 10/3 - IV Valium and IV morphine DC 10/3 - Monitor EKG, on Seroquel - Minimize use of IV sedatives - Urine drug screen positive for Benzodiazepines - Evaluated by psychiatry, appreciate input  - CT head negative for acute intracranial abnormality  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 Could tolerate po intake however in very small amounts, poor nutritional status on po intake alone.   Elevated liver enzymes likely secondary to hepatitis C hx>>resolved -Trend hepatic function panel prn  Lactic acidosis>>resolved -Trend BMP and lactic acid prn  Best Practice (right click and "Reselect all SmartList Selections" daily)   Diet/type: NPO, tube feeds  DVT prophylaxis: SCD's, Eliquis restarted 10/1 GI prophylaxis: PPI Lines: N/A Foley: N/A Code Status:  full code Last date of multidisciplinary goals of care discussion [03/28/2021]   Labs   CBC: Recent Labs  Lab 03/24/21 0321 03/25/21 0547 03/26/21 0523 03/27/21 0608 03/29/21 0347  WBC 9.1 8.6 10.6* 12.0* 14.1*  NEUTROABS  --   --   --  8.6* 11.0*  HGB 8.2* 8.1* 8.2* 8.0* 8.8*  HCT 25.4* 25.1* 26.2* 25.4* 28.5*  MCV 94.4 95.4 96.7 96.2 96.0  PLT 341 364 388 384 454*     Basic Metabolic Panel: Recent Labs  Lab 03/24/21 0321 03/25/21 0547  03/26/21 0523 03/27/21 0608 03/29/21 0347 03/30/21 0432  NA 134* 135 137 137 139 140  K 3.2* 3.8 3.6 3.6 3.5 3.6  CL 90* 94* 96* 96* 97* 98  CO2 37* 36* 35* 34* 34* 35*  GLUCOSE 116* 108* 102* 99 118* 112*  BUN 17 19 20 20 21  25*  CREATININE 0.68 0.69 0.66 0.51 0.61 0.63  CALCIUM 8.6* 8.2* 8.3* 8.6* 8.9 9.0  MG 1.7 2.3 2.0 1.9 2.0  --   PHOS 2.8 3.1 3.4 3.6 3.9  --     GFR: Estimated Creatinine Clearance: 47 mL/min (by C-G formula based on SCr of 0.63 mg/dL). Recent Labs  Lab 03/25/21 0547 03/26/21 0523 03/27/21 0608 03/29/21 0347  WBC 8.6 10.6* 12.0* 14.1*     Liver Function Tests: Recent Labs  Lab 03/24/21 0321 03/25/21 0547  AST 29 23  ALT 29 26  ALKPHOS 32* 28*  BILITOT 0.5 0.8  PROT 5.3* 5.1*  ALBUMIN 2.9* 2.8*    No results for input(s): LIPASE, AMYLASE in the last 168 hours. No results for input(s): AMMONIA in the last 168 hours.  ABG    Component Value Date/Time   PHART 7.50 (H) 03/21/2021 0525   PCO2ART 40 03/21/2021 0525   PO2ART 120 (H) 03/21/2021 0525   HCO3 31.2 (H) 03/21/2021 0525   O2SAT 99.0 03/21/2021 0525     Coagulation Profile: No results for input(s): INR, PROTIME in the last 168 hours.   Cardiac Enzymes: No results for input(s): CKTOTAL, CKMB, CKMBINDEX, TROPONINI in the last 168 hours.  HbA1C: Hgb A1c MFr Bld  Date/Time Value Ref Range Status  01/08/2021 06:28 AM 4.9 4.8 - 5.6 % Final    Comment:    (NOTE) Pre diabetes:          5.7%-6.4%  Diabetes:              >6.4%  Glycemic control for   <7.0% adults with diabetes   06/06/2020 12:07 PM 5.3 4.8 - 5.6 % Final    Comment:    (NOTE) Pre diabetes:          5.7%-6.4%  Diabetes:              >6.4%  Glycemic control for   <7.0% adults with diabetes     CBG: Recent Labs  Lab 03/26/21 0726 03/26/21 1134 03/26/21 1532 03/26/21 1925 03/26/21 2318  GLUCAP 105* 119* 158* 116* 96    Past Medical History:  She,  has a past medical history of Anemia, Anxiety,  Arthritis, Atrial fibrillation (HCC), C. difficile colitis (09/2015), COPD (chronic obstructive pulmonary disease) (HCC), Depression, Dyspnea, Dysrhythmia, Fibromyalgia, H/O tracheostomy, Hep C w/ coma, chronic, Hypertension, MRSA pneumonia (HCC) (2017), On home oxygen therapy, Osteoporosis, Peripheral neuropathy, RLS (restless legs syndrome), S/P percutaneous endoscopic gastrostomy (PEG) tube placement (HCC) (09/2015), and Ventilator associated pneumonia (HCC) (10/2015).   Surgical History:   Past Surgical History:  Procedure Laterality Date   ABDOMINAL HYSTERECTOMY     BREAST SURGERY Bilateral    Breast Implants   DILATION AND CURETTAGE  OF UTERUS     PEG PLACEMENT N/A 06/21/2020   Procedure: PERCUTANEOUS ENDOSCOPIC GASTROSTOMY (PEG) PLACEMENT;  Surgeon: Regis Bill, MD;  Location: [facility] ENDOSCOPY;  Service: Endoscopy;  Laterality: N/A;   REVERSE SHOULDER ARTHROPLASTY Right 03/06/2017   Procedure: REVERSE SHOULDER ARTHROPLASTY;  Surgeon: Christena Flake, MD;  Location: [facility] ORS;  Service: Orthopedics;  Laterality: Right;   ROTATOR CUFF REPAIR Bilateral    TRACHEOSTOMY TUBE PLACEMENT N/A 06/15/2020   Procedure: TRACHEOSTOMY;  Surgeon: Geanie Logan, MD;  Location: [facility] ORS;  Service: ENT;  Laterality: N/A;     Social History:   reports that she quit smoking about 5 years ago. Her smoking use included cigarettes. She smoked an average of 1.5 packs per day. She has never used smokeless tobacco. She reports that she does not drink alcohol and does not use drugs.   Family History:  Her family history includes Hypertension in her mother.   Allergies No Known Allergies   Home Medications  Prior to Admission medications   Medication Sig Start Date End Date Taking? Authorizing Provider  albuterol (VENTOLIN HFA) 108 (90 Base) MCG/ACT inhaler Inhale 2 puffs into the lungs every 4 (four) hours as needed for wheezing or shortness of breath.    [provider]  amitriptyline (ELAVIL)  25 MG tablet Take 25 mg by mouth at bedtime.  01/14/19   [provider]  apixaban (ELIQUIS) 5 MG TABS tablet Take 1 tablet (5 mg total) by mouth 2 (two) times daily. 11/10/16   Adrian Saran, MD  budesonide (PULMICORT) 0.5 MG/2ML nebulizer solution Take 0.5 mg by nebulization daily. 11/28/20   [provider]  diazepam (VALIUM) 5 MG tablet Take 1-1.5 tablets (5-7.5 mg total) by mouth every 8 (eight) hours as needed for anxiety. 03/12/21   Osvaldo Shipper, MD  diltiazem (CARDIZEM CD) 120 MG 24 hr capsule Take 120 mg by mouth daily. 04/29/19   [provider]  estradiol (ESTRACE) 1 MG tablet Take 1 mg by mouth daily. 09/10/16   [provider]  gabapentin (NEURONTIN) 300 MG capsule Take 300 mg by mouth 3 (three) times daily. 05/06/20   [provider]  ipratropium-albuterol (DUONEB) 0.5-2.5 (3) MG/3ML SOLN Take 3 mLs by nebulization every 6 (six) hours. 02/23/21   Salena Saner, MD  iron polysaccharides (NIFEREX) 150 MG capsule Take 1 capsule (150 mg total) by mouth daily. 01/12/21 04/12/21  Gillis Santa, MD  MAGNESIUM-OXIDE 400 (241.3 Mg) MG tablet Take 400 mg by mouth daily.  08/24/16   [provider]  methocarbamol (ROBAXIN) 500 MG tablet Take 500 mg by mouth 2 (two) times daily. 12/27/20   [provider]  metoprolol tartrate (LOPRESSOR) 25 MG tablet Take 1 tablet (25 mg total) by mouth 2 (two) times daily. 03/12/21 06/10/21  Osvaldo Shipper, MD  omeprazole (PRILOSEC) 40 MG capsule Take 40 mg by mouth daily. 04/16/19   [provider]  predniSONE (DELTASONE) 5 MG tablet Take 5 mg by mouth daily. 12/25/20   [provider]  QUEtiapine (SEROQUEL) 25 MG tablet Take 25 mg by mouth at bedtime.  10/01/16   [provider]  rosuvastatin (CRESTOR) 10 MG tablet Take 10 mg by mouth at bedtime. 10/23/20   [provider]  vitamin B-12 100 MCG tablet Take 1 tablet (100 mcg total) by mouth daily. 01/12/21 04/12/21  Gillis Santa, MD  Vitamin D, Ergocalciferol, (DRISDOL) 1.25 MG (50000 UNIT) CAPS capsule Take 1 capsule (50,000 Units total) by mouth every 7 (  seven) days. 01/17/21 04/17/21  Gillis Santa, MD    Scheduled Meds:  amitriptyline  25 mg Per Tube QHS   apixaban  5 mg Per Tube BID   budesonide (PULMICORT) nebulizer solution  0.25 mg Nebulization BID   chlorhexidine  15 mL Mouth Rinse BID   Chlorhexidine Gluconate Cloth  6 each Topical Q0600   diazepam  7.5 mg Per Tube Q8H   docusate  100 mg Per Tube BID   free water  60 mL Per Tube Q4H   gabapentin  300 mg Per Tube TID   ipratropium-albuterol  3 mL Nebulization Q6H   iron polysaccharides  150 mg Per Tube Daily   lidocaine  1 patch Transdermal Q24H   magnesium oxide  400 mg Per Tube Daily   mouth rinse  15 mL Mouth Rinse q12n4p   methocarbamol  500 mg Per Tube BID   metoprolol tartrate  25 mg Per Tube BID   [START ON 03/31/2021] pantoprazole sodium  40 mg Per Tube Daily   [START ON 03/31/2021] predniSONE  10 mg Oral Q breakfast   QUEtiapine  25 mg Per Tube QHS   rosuvastatin  10 mg Per Tube QHS   sodium chloride flush  10-40 mL Intracatheter Q12H   cyanocobalamin  100 mcg Per Tube Daily   Continuous Infusions:  sodium chloride     feeding supplement (VITAL AF 1.2 CAL) 1,000 mL (03/28/21 2128)   PRN Meds:.Benzocaine, docusate, hydrOXYzine, ipratropium-albuterol, metoprolol tartrate, phenol, sodium chloride flush, white petrolatum    Critical care provider statement:   Total critical care time: 33 minutes   Performed by: Karna Christmas MD   Critical care time was exclusive of separately billable procedures and treating other patients.   Critical care was necessary to treat or prevent imminent or life-threatening deterioration.   Critical care was time spent personally by me on the following activities: development of treatment plan with patient and/or surrogate as well as nursing, discussions with consultants, evaluation of patient's response  to treatment, examination of patient, obtaining history from patient or surrogate, ordering and performing treatments and interventions, ordering and review of laboratory studies, ordering and review of radiographic studies, pulse oximetry and re-evaluation of patient's condition.    Vida Rigger, M.D.  Pulmonary & Critical Care Medicine

## 2021-03-30 NOTE — Progress Notes (Signed)
Chaplain Maggie visited at bedside with patient who requested a prayer for peace. Prayer and spiritual conversation were central to this visit. Pt desires for peace for her son, Virginia [name], so he may accept and honor difficult decision she makes related to her care. Pt shared her hope for her son to be in harmony with her decisions. Pt appreciated support and care offered by chaplain. Continued support available per on call chaplain.

## 2021-03-31 LAB — BASIC METABOLIC PANEL
Anion gap: 11 (ref 5–15)
BUN: 23 mg/dL (ref 8–23)
CO2: 33 mmol/L — ABNORMAL HIGH (ref 22–32)
Calcium: 9.4 mg/dL (ref 8.9–10.3)
Chloride: 93 mmol/L — ABNORMAL LOW (ref 98–111)
Creatinine, Ser: 0.58 mg/dL (ref 0.44–1.00)
GFR, Estimated: >60 mL/min (ref >60)
Glucose, Bld: 110 mg/dL — ABNORMAL HIGH (ref 70–99)
Potassium: 3.7 mmol/L (ref 3.5–5.1)
Sodium: 137 mmol/L (ref 135–145)

## 2021-03-31 LAB — CBC WITH DIFFERENTIAL/PLATELET
Abs Immature Granulocytes: 0.07 10*3/uL (ref 0.00–0.07)
Basophils Absolute: 0.1 10*3/uL (ref 0.0–0.1)
Basophils Relative: 1 %
Eosinophils Absolute: 0.2 10*3/uL (ref 0.0–0.5)
Eosinophils Relative: 2 %
HCT: 28.1 % — ABNORMAL LOW (ref 36.0–46.0)
Hemoglobin: 8.5 g/dL — ABNORMAL LOW (ref 12.0–15.0)
Immature Granulocytes: 1 %
Lymphocytes Relative: 19 %
Lymphs Abs: 1.9 10*3/uL (ref 0.7–4.0)
MCH: 29.3 pg (ref 26.0–34.0)
MCHC: 30.2 g/dL (ref 30.0–36.0)
MCV: 96.9 fL (ref 80.0–100.0)
Monocytes Absolute: 1.2 10*3/uL — ABNORMAL HIGH (ref 0.1–1.0)
Monocytes Relative: 12 %
Neutro Abs: 6.9 10*3/uL (ref 1.7–7.7)
Neutrophils Relative %: 65 %
Platelets: 425 10*3/uL — ABNORMAL HIGH (ref 150–400)
RBC: 2.9 MIL/uL — ABNORMAL LOW (ref 3.87–5.11)
RDW: 15.9 % — ABNORMAL HIGH (ref 11.5–15.5)
WBC: 10.3 10*3/uL (ref 4.0–10.5)
nRBC: 0 % (ref 0.0–0.2)

## 2021-03-31 LAB — PHOSPHORUS: Phosphorus: 4.5 mg/dL (ref 2.5–4.6)

## 2021-03-31 LAB — MAGNESIUM: Magnesium: 1.9 mg/dL (ref 1.7–2.4)

## 2021-03-31 MED ORDER — BENZOCAINE 20 % MT AERO
INHALATION_SPRAY | Freq: Three times a day (TID) | OROMUCOSAL | Status: DC | PRN
  Administered 2021-03-31 – 2021-04-01 (×2): 1 via OROMUCOSAL
  Filled 2021-03-31 (×3): qty 57

## 2021-03-31 MED ORDER — BENZOCAINE 10 % MT GEL
Freq: Three times a day (TID) | OROMUCOSAL | Status: DC | PRN
  Filled 2021-03-31: qty 9

## 2021-03-31 MED ORDER — DIAZEPAM 5 MG PO TABS
10.0000 mg | ORAL_TABLET | Freq: Three times a day (TID) | ORAL | Status: DC
  Administered 2021-03-31 – 2021-04-02 (×6): 10 mg
  Filled 2021-03-31 (×6): qty 2

## 2021-03-31 NOTE — Progress Notes (Signed)
NAME:  Virginia [name], MRN:  [id number], DOB:  [birth year], LOS: 11 ADMISSION DATE:  03/20/2021, CONSULTATION DATE: 03/20/2021 REFERRING MD: Dr. Vicente Males, CHIEF COMPLAINT: Unresponsiveness   History of Present Illness:  This is a 70 yo female with end stage COPD and chronic tracheostomy who presented to [facility] ER via EMS from home on 09/27 due to unresponsiveness.  Per ER notes EMS reported pts initial GCS was 3 and O2 sats were unreadable.  EMS provided bag ventilation via tracheostomy en route to the ER.  Pt also received 2 mg of narcan and initially became responsive.  Pts son informed EMS on the scene pts code status DNR, however there was no DNR paperwork readily available on the scene.  Pt recently hospitalized at [facility] from 08/28~09/19 following treatment of acute on chronic hypoxic hypercapnic respiratory failure secondary to AECOPD and enterobacter aerogenes bronchitis.    ED course Upon arrival to the ER pt unresponsive to verbal/painful stimulation with O2 sats initially in the mid 90's on RA.  However, pt immediately became hypoxic with O2 sats decreasing to 60% with shallow respirations on RA, requiring bag ventilation via tracheostomy.  Due to continued hypoxia pts #6 cuffless tracheostomy was changed to a #6 cuffed trach, and pt placed on mechanical ventilation via trach with O2 sats increasing to 92%.  Upon removal of cuffless trach RT noted pts inner canula was partially occluded by dried secretions.  VBG revealed pH 7.15/pCO2 118/acid-base excess 9.2/bicarb 41.1.  CXR concerning for bilateral trace pleural effusions and emphysema.  Code sepsis activated by ER physician pt received 1L LR bolus, ceftriaxone, azithromycin, and vancomycin due to concern of pneumonia.  PCCM team contacted for ICU admission.    Pertinent  Medical History  Atrial Fibrillation on Eliquis C. Difficile Severe COPD Chronic tracheostomy Anxiety & depression Hepatitis  C Arthritis HTN Fibromyalgia Anemia CAD Thoracic AAA  Significant Hospital Events: Including procedures, antibiotic start and stop dates in addition to other pertinent events   09/27: Pt admitted to ICU with acute on chronic hypoxic hypercapnic respiratory failure secondary to end stage COPD requiring mechanical ventilation via chronic tracheostomy  09/29: Tolerating PSV, will attempt TCT as tolerated 09/30: Unable to tolerate weaning pressures on PSV (immediately becomes extremely anxious and SOB with hypooxia), add scheduled Valium, plan to place Dobhoff for enteral access 10/2: Tolerating trach collars trials at 35%.  10/3: Eliquis restarted 10/1 after no plans for GI procedure and no further evidence of GI bleeding with hgb stable >8.0 10/4: No acute events overnight. Benzos adjusted yesterday per psychiatry, IV Valium and IV morphine discontinued.  10/5: Frequent episodes of diarrhea, continues on tube feeds. SLP eval today for po intake assessment.  Palliative asessment with hospice evaluation per patient request 03/29/21 - patient discussing hospice with palliative care and having family meeting for goals of care.  She had VT sustained and this responded well to beta blocker x metoprolol x1.  She reports severe anxiety and asking for benzodiazepine.  She is being followed by [facility] for end of life care.  10.7/22 - patient was evaluated by [facility] and is in process of getting bed approval for hospice care.  03/31/21- patient unchanged from yesterday , awaiting hospice placement.    Objective   Blood pressure 106/72, pulse 81, temperature 98.5 F (36.9 C), temperature source Oral, resp. rate 19, height 5' (1.524 m), weight 50 kg, SpO2 97 %.    Vent Mode: PRVC FiO2 (%):  [28 %] 28 % Set Rate:  [  14 bmp] 14 bmp Vt Set:  [450 mL] 450 mL PEEP:  [5 cmH20] 5 cmH20   Intake/Output Summary (Last 24 hours) at 03/31/2021 1041 Last data filed at 03/31/2021 0600 Gross per 24 hour  Intake  1075 ml  Output 1450 ml  Net -375 ml    Filed Weights   03/29/21 0345 03/30/21 0436 03/31/21 0453  Weight: 47.8 kg 49.9 kg 50 kg   Examination: General: chronically ill appearing frail female, NAD, mechanically ventilated via chronic tracheostomy HENT: #6 [place]iley  midline chronic tracheostomy, no JVD  Lungs: mechanical breath sounds throughout, no wheezing or rales noted, synchronous with vent, even, non labored  Cardiovascular: Tachycardia, regular rhythm, no R/G, 2+ radial/1+ distal pulses trace bilateral lower extremity edema  Abdomen: +BS x4, soft, non tender, non distended, bowel sounds active x4 quadrants Extremities: normal tone, no edema  Neuro: awake, alert, following commands, no focal deficits, pupils PERRL GU: External catheter in place  Resolved Hospital Problem list   Lactic acidosis  Assessment & Plan:   Acute on chronic hypoxic hypercapnic respiratory failure secondary to AECOPD and mucous plugging & questionable underlying pneumonia Enterobacter 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 03/21/21 Mechanical ventilation via chronic tracheostomy  Hx: Chronic Home O2 @4 .5~5L during the day and trilogy with capped tracheostomy qhs -Trach collar trials during the day with vent as needed at night -patient is with palliative service for hospice  Mildly elevated troponin likely secondary to demand ischemia  Chronic atrial fibrillation on Eliquis SVT  Hx: HTN and CAD  -Continuous telemetry monitoring  -Eliquis restarted 10/1 after GI cleared  -Continue Metoprolol, Cardizem, and Lisinopril as BP and renal functions permits -TSH low, but Free T4 normal -Prn metoprolol for hr >115   Acute Blood Loss anemia due to GI Bleed superimposed on Anemia of Chronic Disease, stable Patient with dark tarry stools POA that were Hemoccult positive Hx: Anemia -Monitor for S/Sx of bleeding -Trend CBC - hgb stable > 8.0, patient with hx chronic  anemia -SCD's for VTE Prophylaxis, Eliquis restarted 10/1 -Transfusion held due to hospice  -Pantoprazole 40mg  IV BID -Hold NSAIDs, Anticoagulation, ASA -GI evaluated ~ patient declined EGD,   Recurrent encephalopathy due to hypercapnia >>resolved Anxiety/Panic Attacks with weaning Hx: Anxiety and depression  Benzodiazepine dependency - Maintain RASS goal 0 to -1 - Continue home po meds: diazepam, gabapentin, Seroquel, amitriptyline    - Diazepam tablets increased to 7.5 mg q8h, per tube per psychiatry    - Hydroxyzine 25mg  TID prn per tube added 10/3 - IV Valium and IV morphine DC 10/3 - Monitor EKG, on Seroquel - Minimize use of IV sedatives - Urine drug screen positive for Benzodiazepines - Evaluated by psychiatry, appreciate input  - CT head negative for acute intracranial abnormality  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 Could tolerate po intake however in very small amounts, poor nutritional status on po intake alone.   Elevated liver enzymes likely secondary to hepatitis C hx>>resolved -Trend hepatic function panel prn  Lactic acidosis>>resolved -Trend BMP and lactic acid prn  Best Practice (right click and "Reselect all SmartList Selections" daily)   Diet/type: NPO, tube feeds  DVT prophylaxis: SCD's, Eliquis restarted 10/1 GI prophylaxis: PPI Lines: N/A Foley: N/A Code Status:  full code Last date of multidisciplinary goals of care discussion [03/28/2021]  Labs   CBC: Recent Labs  Lab 03/25/21 0547 03/26/21 0523 03/27/21 0608 03/29/21 0347 03/31/21 0441  WBC 8.6 10.6* 12.0* 14.1* 10.3  NEUTROABS  --   --  8.6* 11.0* 6.9  HGB 8.1* 8.2* 8.0* 8.8* 8.5*  HCT 25.1* 26.2* 25.4* 28.5* 28.1*  MCV 95.4 96.7 96.2 96.0 96.9  PLT 364 388 384 454* 425*     Basic Metabolic  Panel: Recent Labs  Lab 03/25/21 0547 03/26/21 0523 03/27/21 0608 03/29/21 0347 03/30/21 0432 03/31/21 0441  NA 135 137 137 139 140 137  K 3.8 3.6 3.6 3.5 3.6 3.7  CL 94* 96* 96* 97* 98 93*  CO2 36* 35* 34* 34* 35* 33*  GLUCOSE 108* 102* 99 118* 112* 110*  BUN 19 20 20 21  25* 23  CREATININE 0.69 0.66 0.51 0.61 0.63 0.58  CALCIUM 8.2* 8.3* 8.6* 8.9 9.0 9.4  MG 2.3 2.0 1.9 2.0  --  1.9  PHOS 3.1 3.4 3.6 3.9  --  4.5    GFR: Estimated Creatinine Clearance: 47 mL/min (by C-G formula based on SCr of 0.58 mg/dL). Recent Labs  Lab 03/26/21 0523 03/27/21 0608 03/29/21 0347 03/31/21 0441  WBC 10.6* 12.0* 14.1* 10.3     Liver Function Tests: Recent Labs  Lab 03/25/21 0547  AST 23  ALT 26  ALKPHOS 28*  BILITOT 0.8  PROT 5.1*  ALBUMIN 2.8*    No results for input(s): LIPASE, AMYLASE in the last 168 hours. No results for input(s): AMMONIA in the last 168 hours.  ABG    Component Value Date/Time   PHART 7.50 (H) 03/21/2021 0525   PCO2ART 40 03/21/2021 0525   PO2ART 120 (H) 03/21/2021 0525   HCO3 31.2 (H) 03/21/2021 0525   O2SAT 99.0 03/21/2021 0525     Coagulation Profile: No results for input(s): INR, PROTIME in the last 168 hours.   Cardiac Enzymes: No results for input(s): CKTOTAL, CKMB, CKMBINDEX, TROPONINI in the last 168 hours.  HbA1C: Hgb A1c MFr Bld  Date/Time Value Ref Range Status  01/08/2021 06:28 AM 4.9 4.8 - 5.6 % Final    Comment:    (NOTE) Pre diabetes:          5.7%-6.4%  Diabetes:              >6.4%  Glycemic control for   <7.0% adults with diabetes   06/06/2020 12:07 PM 5.3 4.8 - 5.6 % Final    Comment:    (NOTE) Pre diabetes:          5.7%-6.4%  Diabetes:              >6.4%  Glycemic control for   <7.0% adults with diabetes     CBG: Recent Labs  Lab 03/26/21 0726 03/26/21 1134 03/26/21 1532 03/26/21 1925 03/26/21 2318  GLUCAP 105* 119* 158* 116* 96    Past Medical History:  She,  has a past medical history of  Anemia, Anxiety, Arthritis, Atrial fibrillation (HCC), C. difficile colitis (09/2015), COPD (chronic obstructive pulmonary disease) (HCC), Depression, Dyspnea, Dysrhythmia, Fibromyalgia, H/O tracheostomy, Hep C w/ coma, chronic, Hypertension, MRSA pneumonia (HCC) (2017), On home oxygen therapy, Osteoporosis, Peripheral neuropathy, RLS (restless legs syndrome), S/P percutaneous endoscopic gastrostomy (PEG) tube placement (HCC) (09/2015), and Ventilator associated pneumonia (HCC) (10/2015).   Surgical History:   Past Surgical History:  Procedure Laterality Date   ABDOMINAL HYSTERECTOMY     BREAST SURGERY Bilateral    Breast Implants   DILATION AND CURETTAGE OF UTERUS  PEG PLACEMENT N/A 06/21/2020   Procedure: PERCUTANEOUS ENDOSCOPIC GASTROSTOMY (PEG) PLACEMENT;  Surgeon: Regis Bill, MD;  Location: [facility] ENDOSCOPY;  Service: Endoscopy;  Laterality: N/A;   REVERSE SHOULDER ARTHROPLASTY Right 03/06/2017   Procedure: REVERSE SHOULDER ARTHROPLASTY;  Surgeon: Christena Flake, MD;  Location: [facility] ORS;  Service: Orthopedics;  Laterality: Right;   ROTATOR CUFF REPAIR Bilateral    TRACHEOSTOMY TUBE PLACEMENT N/A 06/15/2020   Procedure: TRACHEOSTOMY;  Surgeon: Geanie Logan, MD;  Location: [facility] ORS;  Service: ENT;  Laterality: N/A;     Social History:   reports that she quit smoking about 5 years ago. Her smoking use included cigarettes. She smoked an average of 1.5 packs per day. She has never used smokeless tobacco. She reports that she does not drink alcohol and does not use drugs.   Family History:  Her family history includes Hypertension in her mother.   Allergies No Known Allergies   Home Medications  Prior to Admission medications   Medication Sig Start Date End Date Taking? Authorizing Provider  albuterol (VENTOLIN HFA) 108 (90 Base) MCG/ACT inhaler Inhale 2 puffs into the lungs every 4 (four) hours as needed for wheezing or shortness of breath.    [provider]   amitriptyline (ELAVIL) 25 MG tablet Take 25 mg by mouth at bedtime.  01/14/19   [provider]  apixaban (ELIQUIS) 5 MG TABS tablet Take 1 tablet (5 mg total) by mouth 2 (two) times daily. 11/10/16   Adrian Saran, MD  budesonide (PULMICORT) 0.5 MG/2ML nebulizer solution Take 0.5 mg by nebulization daily. 11/28/20   [provider]  diazepam (VALIUM) 5 MG tablet Take 1-1.5 tablets (5-7.5 mg total) by mouth every 8 (eight) hours as needed for anxiety. 03/12/21   Osvaldo Shipper, MD  diltiazem (CARDIZEM CD) 120 MG 24 hr capsule Take 120 mg by mouth daily. 04/29/19   [provider]  estradiol (ESTRACE) 1 MG tablet Take 1 mg by mouth daily. 09/10/16   [provider]  gabapentin (NEURONTIN) 300 MG capsule Take 300 mg by mouth 3 (three) times daily. 05/06/20   [provider]  ipratropium-albuterol (DUONEB) 0.5-2.5 (3) MG/3ML SOLN Take 3 mLs by nebulization every 6 (six) hours. 02/23/21   Salena Saner, MD  iron polysaccharides (NIFEREX) 150 MG capsule Take 1 capsule (150 mg total) by mouth daily. 01/12/21 04/12/21  Gillis Santa, MD  MAGNESIUM-OXIDE 400 (241.3 Mg) MG tablet Take 400 mg by mouth daily.  08/24/16   [provider]  methocarbamol (ROBAXIN) 500 MG tablet Take 500 mg by mouth 2 (two) times daily. 12/27/20   [provider]  metoprolol tartrate (LOPRESSOR) 25 MG tablet Take 1 tablet (25 mg total) by mouth 2 (two) times daily. 03/12/21 06/10/21  Osvaldo Shipper, MD  omeprazole (PRILOSEC) 40 MG capsule Take 40 mg by mouth daily. 04/16/19   [provider]  predniSONE (DELTASONE) 5 MG tablet Take 5 mg by mouth daily. 12/25/20   [provider]  QUEtiapine (SEROQUEL) 25 MG tablet Take 25 mg by mouth at bedtime.  10/01/16   [provider]  rosuvastatin (CRESTOR) 10 MG tablet Take 10 mg by mouth at bedtime. 10/23/20   [provider]  vitamin B-12 100 MCG tablet Take 1 tablet (100 mcg total) by mouth daily.  01/12/21 04/12/21  Gillis Santa, MD  Vitamin D, Ergocalciferol, (DRISDOL) 1.25 MG (50000 UNIT) CAPS capsule Take 1 capsule (50,000 Units total) by mouth every 7 (seven) days. 01/17/21 04/17/21  Lucianne Muss,  Dileep, MD    Scheduled Meds:  amitriptyline  25 mg Per Tube QHS   apixaban  5 mg Per Tube BID   budesonide (PULMICORT) nebulizer solution  0.25 mg Nebulization BID   chlorhexidine  15 mL Mouth Rinse BID   Chlorhexidine Gluconate Cloth  6 each Topical Q0600   diazepam  7.5 mg Per Tube Q8H   docusate  100 mg Per Tube BID   free water  60 mL Per Tube Q4H   gabapentin  300 mg Per Tube TID   ipratropium-albuterol  3 mL Nebulization Q6H   iron polysaccharides  150 mg Per Tube Daily   lidocaine  1 patch Transdermal Q24H   magnesium oxide  400 mg Per Tube Daily   mouth rinse  15 mL Mouth Rinse q12n4p   methocarbamol  500 mg Per Tube BID   metoprolol tartrate  25 mg Per Tube BID   pantoprazole sodium  40 mg Per Tube Daily   predniSONE  10 mg Oral Q breakfast   QUEtiapine  25 mg Per Tube QHS   rosuvastatin  10 mg Per Tube QHS   sodium chloride flush  10-40 mL Intracatheter Q12H   cyanocobalamin  100 mcg Per Tube Daily   Continuous Infusions:  sodium chloride     feeding supplement (VITAL AF 1.2 CAL) 1,000 mL (03/31/21 0500)   PRN Meds:.Benzocaine, docusate, hydrOXYzine, ipratropium-albuterol, metoprolol tartrate, phenol, sodium chloride flush, white petrolatum    Critical care provider statement:   Total critical care time: 33 minutes   Performed by: Karna Christmas MD   Critical care time was exclusive of separately billable procedures and treating other patients.   Critical care was necessary to treat or prevent imminent or life-threatening deterioration.   Critical care was time spent personally by me on the following activities: development of treatment plan with patient and/or surrogate as well as nursing, discussions with consultants, evaluation of patient's response to treatment,  examination of patient, obtaining history from patient or surrogate, ordering and performing treatments and interventions, ordering and review of laboratory studies, ordering and review of radiographic studies, pulse oximetry and re-evaluation of patient's condition.    Vida Rigger, M.D.  Pulmonary & Critical Care Medicine

## 2021-03-31 NOTE — Progress Notes (Signed)
PHARMACY CONSULT NOTE  Pharmacy Consult for Electrolyte Monitoring and Replacement   Recent Labs: Potassium (mmol/L)  Date Value  03/31/2021 3.7   Magnesium (mg/dL)  Date Value  68/08/2120 1.9   Calcium (mg/dL)  Date Value  48/25/0037 9.4   Albumin (g/dL)  Date Value  04/88/8916 2.8 (L)   Phosphorus (mg/dL)  Date Value  94/50/3888 4.5   Sodium (mmol/L)  Date Value  03/31/2021 137    Assessment: 70 year old female presented to the ED unresponsive. Patient required mechanical ventilation via chronic trach. Respiratory failure s/t end stage COPD. Patient with dark tarry stools that were hemoccult positive in the ED, concerning for GIB. Pharmacy consult to manage electrolytes.  On Mg oxide 400 mg daily.  On free water 60 ml q4H.   Goal of Therapy:  Electrolytes WNL  Plan:  --No replacement at this time  --Plan for patient to discharge to hospice home when bed available. Defer frequency of lab monitoring to provider --Continue to follow along  Pricilla Riffle, PharmD, BCPS Clinical Pharmacist 03/31/2021 7:33 AM

## 2021-04-01 LAB — BASIC METABOLIC PANEL
Anion gap: 8 (ref 5–15)
BUN: 25 mg/dL — ABNORMAL HIGH (ref 8–23)
CO2: 36 mmol/L — ABNORMAL HIGH (ref 22–32)
Calcium: 9.3 mg/dL (ref 8.9–10.3)
Chloride: 96 mmol/L — ABNORMAL LOW (ref 98–111)
Creatinine, Ser: 0.53 mg/dL (ref 0.44–1.00)
GFR, Estimated: >60 mL/min (ref >60)
Glucose, Bld: 96 mg/dL (ref 70–99)
Potassium: 3.7 mmol/L (ref 3.5–5.1)
Sodium: 140 mmol/L (ref 135–145)

## 2021-04-01 LAB — CBC
HCT: 28.3 % — ABNORMAL LOW (ref 36.0–46.0)
Hemoglobin: 8.5 g/dL — ABNORMAL LOW (ref 12.0–15.0)
MCH: 28.9 pg (ref 26.0–34.0)
MCHC: 30 g/dL (ref 30.0–36.0)
MCV: 96.3 fL (ref 80.0–100.0)
Platelets: 412 10*3/uL — ABNORMAL HIGH (ref 150–400)
RBC: 2.94 MIL/uL — ABNORMAL LOW (ref 3.87–5.11)
RDW: 15.6 % — ABNORMAL HIGH (ref 11.5–15.5)
WBC: 9.7 10*3/uL (ref 4.0–10.5)
nRBC: 0 % (ref 0.0–0.2)

## 2021-04-01 LAB — MAGNESIUM: Magnesium: 2 mg/dL (ref 1.7–2.4)

## 2021-04-01 LAB — PHOSPHORUS: Phosphorus: 4.7 mg/dL — ABNORMAL HIGH (ref 2.5–4.6)

## 2021-04-01 NOTE — Progress Notes (Signed)
NAME:  Virginia [name], MRN:  [id number], DOB:  [birth year], LOS: 12 ADMISSION DATE:  03/20/2021, CONSULTATION DATE: 03/20/2021 REFERRING MD: Dr. Vicente Males, CHIEF COMPLAINT: Unresponsiveness   History of Present Illness:  This is a 70 yo female with end stage COPD and chronic tracheostomy who presented to [facility] ER via EMS from home on 09/27 due to unresponsiveness.  Per ER notes EMS reported pts initial GCS was 3 and O2 sats were unreadable.  EMS provided bag ventilation via tracheostomy en route to the ER.  Pt also received 2 mg of narcan and initially became responsive.  Pts son informed EMS on the scene pts code status DNR, however there was no DNR paperwork readily available on the scene.  Pt recently hospitalized at [facility] from 08/28~09/19 following treatment of acute on chronic hypoxic hypercapnic respiratory failure secondary to AECOPD and enterobacter aerogenes bronchitis.    ED course Upon arrival to the ER pt unresponsive to verbal/painful stimulation with O2 sats initially in the mid 90's on RA.  However, pt immediately became hypoxic with O2 sats decreasing to 60% with shallow respirations on RA, requiring bag ventilation via tracheostomy.  Due to continued hypoxia pts #6 cuffless tracheostomy was changed to a #6 cuffed trach, and pt placed on mechanical ventilation via trach with O2 sats increasing to 92%.  Upon removal of cuffless trach RT noted pts inner canula was partially occluded by dried secretions.  VBG revealed pH 7.15/pCO2 118/acid-base excess 9.2/bicarb 41.1.  CXR concerning for bilateral trace pleural effusions and emphysema.  Code sepsis activated by ER physician pt received 1L LR bolus, ceftriaxone, azithromycin, and vancomycin due to concern of pneumonia.  PCCM team contacted for ICU admission.    Pertinent  Medical History  Atrial Fibrillation on Eliquis C. Difficile Severe COPD Chronic tracheostomy Anxiety & depression Hepatitis  C Arthritis HTN Fibromyalgia Anemia CAD Thoracic AAA  Significant Hospital Events: Including procedures, antibiotic start and stop dates in addition to other pertinent events   09/27: Pt admitted to ICU with acute on chronic hypoxic hypercapnic respiratory failure secondary to end stage COPD requiring mechanical ventilation via chronic tracheostomy  09/29: Tolerating PSV, will attempt TCT as tolerated 09/30: Unable to tolerate weaning pressures on PSV (immediately becomes extremely anxious and SOB with hypooxia), add scheduled Valium, plan to place Dobhoff for enteral access 10/2: Tolerating trach collars trials at 35%.  10/3: Eliquis restarted 10/1 after no plans for GI procedure and no further evidence of GI bleeding with hgb stable >8.0 10/4: No acute events overnight. Benzos adjusted yesterday per psychiatry, IV Valium and IV morphine discontinued.  10/5: Frequent episodes of diarrhea, continues on tube feeds. SLP eval today for po intake assessment.  Palliative asessment with hospice evaluation per patient request 03/29/21 - patient discussing hospice with palliative care and having family meeting for goals of care.  She had VT sustained and this responded well to beta blocker x metoprolol x1.  She reports severe anxiety and asking for benzodiazepine.  She is being followed by [facility] for end of life care.  10.7/22 - patient was evaluated by [facility] and is in process of getting bed approval for hospice care.  10/8-10/10/22- patient unchanged Clinically, awaiting hospice placement.    Objective   Blood pressure 115/76, pulse 93, temperature 98.7 F (37.1 C), temperature source Oral, resp. rate 18, height 5' (1.524 m), weight 49.6 kg, SpO2 98 %.    Vent Mode: PRVC FiO2 (%):  [28 %] 28 % Set Rate:  [14 bmp]  14 bmp Vt Set:  [450 mL] 450 mL PEEP:  [5 cmH20] 5 cmH20 Plateau Pressure:  [12 cmH20-24 cmH20] 24 cmH20   Intake/Output Summary (Last 24 hours) at 04/01/2021 1032 Last data  filed at 04/01/2021 1005 Gross per 24 hour  Intake 1090 ml  Output 1850 ml  Net -760 ml    Filed Weights   03/30/21 0436 03/31/21 0453 04/01/21 0500  Weight: 49.9 kg 50 kg 49.6 kg   Examination: General: chronically ill appearing frail female, NAD, mechanically ventilated via chronic tracheostomy HENT: #6 [place]  midline chronic tracheostomy, no JVD  Lungs: mechanical breath sounds throughout, no wheezing or rales noted, synchronous with vent, even, non labored  Cardiovascular: Tachycardia, regular rhythm, no R/G, 2+ radial/1+ distal pulses trace bilateral lower extremity edema  Abdomen: +BS x4, soft, non tender, non distended, bowel sounds active x4 quadrants Extremities: normal tone, no edema  Neuro: awake, alert, following commands, no focal deficits, pupils PERRL GU: External catheter in place  Resolved Hospital Problem list   Lactic acidosis  Assessment & Plan:   Acute on chronic hypoxic hypercapnic respiratory failure secondary to AECOPD and mucous plugging & questionable underlying pneumonia Enterobacter 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 03/21/21 Mechanical ventilation via chronic tracheostomy  Hx: Chronic Home O2 @4 .5~5L during the day and trilogy with capped tracheostomy qhs -Trach collar trials during the day with vent as needed at night -patient is with palliative service for hospice  Mildly elevated troponin likely secondary to demand ischemia  Chronic atrial fibrillation on Eliquis SVT  Hx: HTN and CAD  -Continuous telemetry monitoring  -Eliquis restarted 10/1 after GI cleared  -Continue Metoprolol, Cardizem, and Lisinopril as BP and renal functions permits -TSH low, but Free T4 normal -Prn metoprolol for hr >115   Acute Blood Loss anemia due to GI Bleed superimposed on Anemia of Chronic Disease, stable Patient with dark tarry stools POA that were Hemoccult positive Hx: Anemia -Monitor for S/Sx of bleeding -Trend  CBC - hgb stable > 8.0, patient with hx chronic anemia -SCD's for VTE Prophylaxis, Eliquis restarted 10/1 -Transfusion held due to hospice  -Pantoprazole 40mg  IV BID -Hold NSAIDs, Anticoagulation, ASA -GI evaluated ~ patient declined EGD,   Recurrent encephalopathy due to hypercapnia >>resolved Anxiety/Panic Attacks with weaning Hx: Anxiety and depression  Benzodiazepine dependency - Maintain RASS goal 0 to -1 - Continue home po meds: diazepam, gabapentin, Seroquel, amitriptyline    - Diazepam tablets increased to 7.5 mg q8h, per tube per psychiatry    - Hydroxyzine 25mg  TID prn per tube added 10/3 - IV Valium and IV morphine DC 10/3 - Monitor EKG, on Seroquel - Minimize use of IV sedatives - Urine drug screen positive for Benzodiazepines - Evaluated by psychiatry, appreciate input  - CT head negative for acute intracranial abnormality  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 Could tolerate po intake however in very small amounts, poor nutritional status on po intake alone.   Elevated liver enzymes likely secondary to hepatitis C hx>>resolved -Trend hepatic function panel prn  Lactic acidosis>>resolved -Trend BMP and lactic acid prn  Best Practice (right click and "Reselect all SmartList Selections" daily)   Diet/type: NPO, tube feeds  DVT prophylaxis: SCD's, Eliquis restarted 10/1 GI prophylaxis: PPI Lines: N/A Foley: N/A Code Status:  full code Last date of multidisciplinary  goals of care discussion [03/28/2021]   Labs   CBC: Recent Labs  Lab 03/26/21 0523 03/27/21 0608 03/29/21 0347 03/31/21 0441 04/01/21 0548  WBC 10.6* 12.0* 14.1* 10.3 9.7  NEUTROABS  --  8.6* 11.0* 6.9  --   HGB 8.2* 8.0* 8.8* 8.5* 8.5*  HCT 26.2* 25.4* 28.5* 28.1* 28.3*  MCV 96.7 96.2 96.0 96.9 96.3  PLT  388 384 454* 425* 412*     Basic Metabolic Panel: Recent Labs  Lab 03/26/21 0523 03/27/21 0608 03/29/21 0347 03/30/21 0432 03/31/21 0441 04/01/21 0548  NA 137 137 139 140 137 140  K 3.6 3.6 3.5 3.6 3.7 3.7  CL 96* 96* 97* 98 93* 96*  CO2 35* 34* 34* 35* 33* 36*  GLUCOSE 102* 99 118* 112* 110* 96  BUN 20 20 21  25* 23 25*  CREATININE 0.66 0.51 0.61 0.63 0.58 0.53  CALCIUM 8.3* 8.6* 8.9 9.0 9.4 9.3  MG 2.0 1.9 2.0  --  1.9 2.0  PHOS 3.4 3.6 3.9  --  4.5 4.7*    GFR: Estimated Creatinine Clearance: 47 mL/min (by C-G formula based on SCr of 0.53 mg/dL). Recent Labs  Lab 03/27/21 0608 03/29/21 0347 03/31/21 0441 04/01/21 0548  WBC 12.0* 14.1* 10.3 9.7     Liver Function Tests: No results for input(s): AST, ALT, ALKPHOS, BILITOT, PROT, ALBUMIN in the last 168 hours.  No results for input(s): LIPASE, AMYLASE in the last 168 hours. No results for input(s): AMMONIA in the last 168 hours.  ABG    Component Value Date/Time   PHART 7.50 (H) 03/21/2021 0525   PCO2ART 40 03/21/2021 0525   PO2ART 120 (H) 03/21/2021 0525   HCO3 31.2 (H) 03/21/2021 0525   O2SAT 99.0 03/21/2021 0525     Coagulation Profile: No results for input(s): INR, PROTIME in the last 168 hours.   Cardiac Enzymes: No results for input(s): CKTOTAL, CKMB, CKMBINDEX, TROPONINI in the last 168 hours.  HbA1C: Hgb A1c MFr Bld  Date/Time Value Ref Range Status  01/08/2021 06:28 AM 4.9 4.8 - 5.6 % Final    Comment:    (NOTE) Pre diabetes:          5.7%-6.4%  Diabetes:              >6.4%  Glycemic control for   <7.0% adults with diabetes   06/06/2020 12:07 PM 5.3 4.8 - 5.6 % Final    Comment:    (NOTE) Pre diabetes:          5.7%-6.4%  Diabetes:              >6.4%  Glycemic control for   <7.0% adults with diabetes     CBG: Recent Labs  Lab 03/26/21 0726 03/26/21 1134 03/26/21 1532 03/26/21 1925 03/26/21 2318  GLUCAP 105* 119* 158* 116* 96    Past Medical History:  She,  has a  past medical history of Anemia, Anxiety, Arthritis, Atrial fibrillation (HCC), C. difficile colitis (09/2015), COPD (chronic obstructive pulmonary disease) (HCC), Depression, Dyspnea, Dysrhythmia, Fibromyalgia, H/O tracheostomy, Hep C w/ coma, chronic, Hypertension, MRSA pneumonia (HCC) (2017), On home oxygen therapy, Osteoporosis, Peripheral neuropathy, RLS (restless legs syndrome), S/P percutaneous endoscopic gastrostomy (PEG) tube placement (HCC) (09/2015), and Ventilator associated pneumonia (HCC) (10/2015).   Surgical History:   Past Surgical History:  Procedure Laterality Date   ABDOMINAL HYSTERECTOMY     BREAST SURGERY Bilateral    Breast Implants   DILATION AND CURETTAGE OF UTERUS     PEG  PLACEMENT N/A 06/21/2020   Procedure: PERCUTANEOUS ENDOSCOPIC GASTROSTOMY (PEG) PLACEMENT;  Surgeon: Regis Bill, MD;  Location: [facility] ENDOSCOPY;  Service: Endoscopy;  Laterality: N/A;   REVERSE SHOULDER ARTHROPLASTY Right 03/06/2017   Procedure: REVERSE SHOULDER ARTHROPLASTY;  Surgeon: Christena Flake, MD;  Location: [facility] ORS;  Service: Orthopedics;  Laterality: Right;   ROTATOR CUFF REPAIR Bilateral    TRACHEOSTOMY TUBE PLACEMENT N/A 06/15/2020   Procedure: TRACHEOSTOMY;  Surgeon: Geanie Logan, MD;  Location: [facility] ORS;  Service: ENT;  Laterality: N/A;     Social History:   reports that she quit smoking about 5 years ago. Her smoking use included cigarettes. She smoked an average of 1.5 packs per day. She has never used smokeless tobacco. She reports that she does not drink alcohol and does not use drugs.   Family History:  Her family history includes Hypertension in her mother.   Allergies No Known Allergies   Home Medications  Prior to Admission medications   Medication Sig Start Date End Date Taking? Authorizing Provider  albuterol (VENTOLIN HFA) 108 (90 Base) MCG/ACT inhaler Inhale 2 puffs into the lungs every 4 (four) hours as needed for wheezing or shortness of breath.    [provider]  amitriptyline (ELAVIL) 25 MG tablet Take 25 mg by mouth at bedtime.  01/14/19   [provider]  apixaban (ELIQUIS) 5 MG TABS tablet Take 1 tablet (5 mg total) by mouth 2 (two) times daily. 11/10/16   Adrian Saran, MD  budesonide (PULMICORT) 0.5 MG/2ML nebulizer solution Take 0.5 mg by nebulization daily. 11/28/20   [provider]  diazepam (VALIUM) 5 MG tablet Take 1-1.5 tablets (5-7.5 mg total) by mouth every 8 (eight) hours as needed for anxiety. 03/12/21   Osvaldo Shipper, MD  diltiazem (CARDIZEM CD) 120 MG 24 hr capsule Take 120 mg by mouth daily. 04/29/19   [provider]  estradiol (ESTRACE) 1 MG tablet Take 1 mg by mouth daily. 09/10/16   [provider]  gabapentin (NEURONTIN) 300 MG capsule Take 300 mg by mouth 3 (three) times daily. 05/06/20   [provider]  ipratropium-albuterol (DUONEB) 0.5-2.5 (3) MG/3ML SOLN Take 3 mLs by nebulization every 6 (six) hours. 02/23/21   Salena Saner, MD  iron polysaccharides (NIFEREX) 150 MG capsule Take 1 capsule (150 mg total) by mouth daily. 01/12/21 04/12/21  Gillis Santa, MD  MAGNESIUM-OXIDE 400 (241.3 Mg) MG tablet Take 400 mg by mouth daily.  08/24/16   [provider]  methocarbamol (ROBAXIN) 500 MG tablet Take 500 mg by mouth 2 (two) times daily. 12/27/20   [provider]  metoprolol tartrate (LOPRESSOR) 25 MG tablet Take 1 tablet (25 mg total) by mouth 2 (two) times daily. 03/12/21 06/10/21  Osvaldo Shipper, MD  omeprazole (PRILOSEC) 40 MG capsule Take 40 mg by mouth daily. 04/16/19   [provider]  predniSONE (DELTASONE) 5 MG tablet Take 5 mg by mouth daily. 12/25/20   [provider]  QUEtiapine (SEROQUEL) 25 MG tablet Take 25 mg by mouth at bedtime.  10/01/16   [provider]  rosuvastatin (CRESTOR) 10 MG tablet Take 10 mg by mouth at bedtime. 10/23/20   [provider]  vitamin B-12 100 MCG tablet Take 1 tablet (100 mcg total) by  mouth daily. 01/12/21 04/12/21  Gillis Santa, MD  Vitamin D, Ergocalciferol, (DRISDOL) 1.25 MG (50000 UNIT) CAPS capsule Take 1 capsule (50,000 Units total) by mouth every 7 (seven) days. 01/17/21 04/17/21  Gillis Santa,  MD    Scheduled Meds:  amitriptyline  25 mg Per Tube QHS   apixaban  5 mg Per Tube BID   budesonide (PULMICORT) nebulizer solution  0.25 mg Nebulization BID   chlorhexidine  15 mL Mouth Rinse BID   Chlorhexidine Gluconate Cloth  6 each Topical Q0600   diazepam  10 mg Per Tube Q8H   docusate  100 mg Per Tube BID   free water  60 mL Per Tube Q4H   gabapentin  300 mg Per Tube TID   ipratropium-albuterol  3 mL Nebulization Q6H   iron polysaccharides  150 mg Per Tube Daily   lidocaine  1 patch Transdermal Q24H   magnesium oxide  400 mg Per Tube Daily   mouth rinse  15 mL Mouth Rinse q12n4p   methocarbamol  500 mg Per Tube BID   metoprolol tartrate  25 mg Per Tube BID   pantoprazole sodium  40 mg Per Tube Daily   predniSONE  10 mg Oral Q breakfast   QUEtiapine  25 mg Per Tube QHS   rosuvastatin  10 mg Per Tube QHS   sodium chloride flush  10-40 mL Intracatheter Q12H   cyanocobalamin  100 mcg Per Tube Daily   Continuous Infusions:  sodium chloride     feeding supplement (VITAL AF 1.2 CAL) 1,000 mL (03/31/21 0500)   PRN Meds:.Benzocaine, benzocaine, docusate, hydrOXYzine, ipratropium-albuterol, metoprolol tartrate, phenol, sodium chloride flush, white petrolatum    Critical care provider statement:   Total critical care time: 33 minutes   Performed by: Karna Christmas MD   Critical care time was exclusive of separately billable procedures and treating other patients.   Critical care was necessary to treat or prevent imminent or life-threatening deterioration.   Critical care was time spent personally by me on the following activities: development of treatment plan with patient and/or surrogate as well as nursing, discussions with consultants, evaluation of patient's  response to treatment, examination of patient, obtaining history from patient or surrogate, ordering and performing treatments and interventions, ordering and review of laboratory studies, ordering and review of radiographic studies, pulse oximetry and re-evaluation of patient's condition.    Vida Rigger, M.D.  Pulmonary & Critical Care Medicine

## 2021-04-01 NOTE — Progress Notes (Signed)
PHARMACY CONSULT NOTE  Pharmacy Consult for Electrolyte Monitoring and Replacement   Recent Labs: Potassium (mmol/L)  Date Value  04/01/2021 3.7   Magnesium (mg/dL)  Date Value  11/91/4782 2.0   Calcium (mg/dL)  Date Value  95/62/1308 9.3   Albumin (g/dL)  Date Value  65/78/4696 2.8 (L)   Phosphorus (mg/dL)  Date Value  29/52/8413 4.7 (H)   Sodium (mmol/L)  Date Value  04/01/2021 140    Assessment: 70 year old female presented to the ED unresponsive. Patient required mechanical ventilation via chronic trach. Respiratory failure s/t end stage COPD. Patient with dark tarry stools that were hemoccult positive in the ED, concerning for GIB. Pharmacy consult to manage electrolytes.  On Mg oxide 400 mg daily.  On free water 60 ml q4H.   Goal of Therapy:  Electrolytes WNL  Plan:  --No replacement at this time  --Plan for patient to discharge to hospice home when bed available. Defer frequency of lab monitoring to provider --Continue to follow along  Pricilla Riffle, PharmD, BCPS Clinical Pharmacist 04/01/2021 7:43 AM

## 2021-04-02 DIAGNOSIS — F411 Generalized anxiety disorder: Secondary | ICD-10-CM | POA: Diagnosis not present

## 2021-04-02 DIAGNOSIS — Z7189 Other specified counseling: Secondary | ICD-10-CM | POA: Diagnosis not present

## 2021-04-02 LAB — BASIC METABOLIC PANEL
Anion gap: 12 (ref 5–15)
BUN: 25 mg/dL — ABNORMAL HIGH (ref 8–23)
CO2: 32 mmol/L (ref 22–32)
Calcium: 9.6 mg/dL (ref 8.9–10.3)
Chloride: 95 mmol/L — ABNORMAL LOW (ref 98–111)
Creatinine, Ser: 0.54 mg/dL (ref 0.44–1.00)
GFR, Estimated: >60 mL/min (ref >60)
Glucose, Bld: 105 mg/dL — ABNORMAL HIGH (ref 70–99)
Potassium: 3.5 mmol/L (ref 3.5–5.1)
Sodium: 139 mmol/L (ref 135–145)

## 2021-04-02 LAB — CBC
HCT: 30.6 % — ABNORMAL LOW (ref 36.0–46.0)
Hemoglobin: 9.3 g/dL — ABNORMAL LOW (ref 12.0–15.0)
MCH: 29.2 pg (ref 26.0–34.0)
MCHC: 30.4 g/dL (ref 30.0–36.0)
MCV: 95.9 fL (ref 80.0–100.0)
Platelets: 429 10*3/uL — ABNORMAL HIGH (ref 150–400)
RBC: 3.19 MIL/uL — ABNORMAL LOW (ref 3.87–5.11)
RDW: 15.4 % (ref 11.5–15.5)
WBC: 13 10*3/uL — ABNORMAL HIGH (ref 4.0–10.5)
nRBC: 0 % (ref 0.0–0.2)

## 2021-04-02 LAB — MAGNESIUM: Magnesium: 1.8 mg/dL (ref 1.7–2.4)

## 2021-04-02 LAB — PHOSPHORUS: Phosphorus: 4.6 mg/dL (ref 2.5–4.6)

## 2021-04-02 MED ORDER — NUTRISOURCE FIBER PO PACK
1.0000 | PACK | Freq: Two times a day (BID) | ORAL | Status: DC
  Filled 2021-04-02: qty 1

## 2021-04-02 MED ORDER — LOPERAMIDE HCL 2 MG PO CAPS: 2.0000 mg | ORAL_CAPSULE | ORAL | Status: DC | PRN

## 2021-04-02 NOTE — Progress Notes (Signed)
Notified bed available at hospice facility.  Spoke with pt and she is agreeable to changing code status to DO NOT RESUSCITATE once EMS arrives at hospital to transport her to [facility].  Camie Patience, AGNP  Pulmonary/Critical Care Pager [phone] (please enter 7 digits) PCCM Consult Pager [phone] (please enter 7 digits)

## 2021-04-02 NOTE — Plan of Care (Signed)

## 2021-04-02 NOTE — Discharge Summary (Addendum)
Physician Discharge Summary  Patient ID: Virginia [name] MRN: [id number] DOB/AGE: 70-Mar-1952 70 y.o.  Admit date: 03/20/2021 Discharge date: 04/02/2021    Discharge Diagnoses:  Acute on chronic hypoxic hypercapnic respiratory failure secondary to end stage COPD and mucous plugging  Tracheal aspirate positive for enterobacter aerogenes and acinetobacter calcoaceticus/baumannii complex 03/21/21 Chronic tracheostomy  Mildly elevated troponin secondary to demand ischemia in setting of acute on chronic respiratory failure Chronic atrial fibrillation  Hepatitis C Anemia of chronic disease  Severe protein calorie malnutrition secondary to pulmonary cachexia  Severe anxiety/panic attacks                               DISCHARGE SUMMARY   Virginia [name] is a 70 y.o. y/o female with a PMH of multiple hospitalizations due to recurrent respiratory failure, end stage COPD and chronic tracheostomy who presented to [facility] ER via EMS from home on 03/20/2021 due to unresponsiveness secondary to acute on chronic hypoxic hypercapnic respiratory failure.  In the ER due to continued hypoxic pts chronic #6 cuffless tracheostomy changed to a #6 cuffed tracheostomy, and pt placed on mechanical ventilation via tracheostomy.  Upon tracheostomy change pts inner canula noted to be partially occluded by dry secretions. CXR concerning for bilateral trace pleural effusions and emphysema.  Code sepsis activated and pt received broad spectrum abx therapy.  She was subsequently admitted to ICU per PCCM team for additional workup and treatment.     While in [facility] ICU plan of care consisted of tracheostomy collar during the day and mechanical ventilation via tracheostomy at bedtime.  While awaiting bed availability at [facility] facility pt requested to speak with Palliative Care due to her overall poor quality of life and did not want to go to a rehab facility.  On 03/28/2021 Palliative Care team consulted and family meeting  scheduled to continue goals of care discussions.  Pt had multiple conversations with her son and daughter-in-law regarding goals of treatment.  On 03/30/2021 pt decided she wanted to be discharged to a hospice facility and transition to full comfort care when she arrived at the facility. Pt remained in ICU pending hospice bed availability.  Significant Hospital Events: Including procedures, antibiotic start and stop dates in addition to other pertinent events   09/27: Pt admitted to ICU with acute on chronic hypoxic hypercapnic respiratory failure secondary to end stage COPD requiring mechanical ventilation via chronic tracheostomy  09/29: Tolerating PSV, will attempt TCT as tolerated 09/30: Unable to tolerate weaning pressures on PSV (immediately becomes extremely anxious and SOB with hypooxia), add scheduled Valium, plan to place Dobhoff for enteral access 10/2: Tolerating trach collars trials at 35%.  10/3: Eliquis restarted 10/1 after no plans for GI procedure and no further evidence of GI bleeding with hgb stable >8.0 10/4: No acute events overnight. Benzos adjusted yesterday per psychiatry, IV Valium and IV morphine discontinued.  10/5: Frequent episodes of diarrhea, continues on tube feeds. SLP eval today for po intake assessment.  Palliative asessment with hospice evaluation per patient request 10/6: Patient discussing hospice with palliative care and having family meeting for goals of care.  She had VT sustained and this responded well to beta blocker x metoprolol x1.  She reports severe anxiety and asking for benzodiazepine.  She is being followed by [facility] for end of life care.  10/7: Patient was evaluated by [facility] and is in process of getting bed approval for hospice care.  TUBES / LINES 10/9: 20 G Right anterior upper arm  Chronic #6 cuffed tracheostomy   Discharge Exam: General Appearance: Well developed resting in bed, NAD  EYES:  PERRLA, EOM intact. NEURO: Alert and oriented,  follows commands  NECK: Supple, No JVD midline tracheostomy clean/dry intact  Pulmonary: Diminished throughout, even, non labored  Cardiovascular: Normal S1,S2.  No M/R/G, 2+ radial/2+ distal pulses present    Abdomen: Benign, soft, non-tender, non tender   Vitals:   04/02/21 1100 04/02/21 1105 04/02/21 1200 04/02/21 1300  BP: (!) 140/99  125/85 (!) 130/94  Pulse: 83 85 90 96  Resp: (!) 21 14 (!) 25 (!) 23  Temp:      TempSrc:      SpO2: 100% 100% 98% 98%  Weight:      Height:        Discharge Labs  BMET Recent Labs  Lab 03/27/21 0608 03/29/21 0347 03/30/21 0432 03/31/21 0441 04/01/21 0548 04/02/21 0540  NA 137 139 140 137 140 139  K 3.6 3.5 3.6 3.7 3.7 3.5  CL 96* 97* 98 93* 96* 95*  CO2 34* 34* 35* 33* 36* 32  GLUCOSE 99 118* 112* 110* 96 105*  BUN 20 21 25* 23 25* 25*  CREATININE 0.51 0.61 0.63 0.58 0.53 0.54  CALCIUM 8.6* 8.9 9.0 9.4 9.3 9.6  MG 1.9 2.0  --  1.9 2.0 1.8  PHOS 3.6 3.9  --  4.5 4.7* 4.6    CBC Recent Labs  Lab 03/31/21 0441 04/01/21 0548 04/02/21 0540  HGB 8.5* 8.5* 9.3*  HCT 28.1* 28.3* 30.6*  WBC 10.3 9.7 13.0*  PLT 425* 412* 429*    Anti-Coagulation No results for input(s): INR in the last 168 hours.  ASSESSMENT/PLAN:  Pt transitioning to hospice facility and will transition to full comfort measures only  Allergies as of 04/02/2021   No Known Allergies      Medication List     STOP taking these medications    acetaminophen 500 MG tablet Commonly known as: TYLENOL   albuterol 108 (90 Base) MCG/ACT inhaler Commonly known as: VENTOLIN HFA   amitriptyline 25 MG tablet Commonly known as: ELAVIL   apixaban 5 MG Tabs tablet Commonly known as: Eliquis   budesonide 0.5 MG/2ML nebulizer solution Commonly known as: PULMICORT   cyanocobalamin 100 MCG tablet   diazepam 5 MG tablet Commonly known as: VALIUM   diltiazem 120 MG 24 hr capsule Commonly known as: CARDIZEM CD   estradiol 1 MG tablet Commonly known as:  ESTRACE   gabapentin 300 MG capsule Commonly known as: NEURONTIN   ipratropium-albuterol 0.5-2.5 (3) MG/3ML Soln Commonly known as: DUONEB   iron polysaccharides 150 MG capsule Commonly known as: NIFEREX   lisinopril 10 MG tablet Commonly known as: ZESTRIL   MAGnesium-Oxide 400 (241.3 Mg) MG tablet Generic drug: magnesium oxide   methocarbamol 500 MG tablet Commonly known as: ROBAXIN   metoprolol tartrate 25 MG tablet Commonly known as: LOPRESSOR   omeprazole 40 MG capsule Commonly known as: PRILOSEC   predniSONE 5 MG tablet Commonly known as: DELTASONE   QUEtiapine 25 MG tablet Commonly known as: SEROQUEL   rosuvastatin 10 MG tablet Commonly known as: CRESTOR   Vitamin D (Ergocalciferol) 1.25 MG (50000 UNIT) Caps capsule Commonly known as: DRISDOL         Disposition: Hospice Facility   Camie Patience, AGNP  Pulmonary/Critical Care Pager [phone] (please enter 7 digits) PCCM Consult Pager [phone] (please enter 7 digits)

## 2021-04-02 NOTE — Progress Notes (Signed)
Patient transferred to the hospice house in [place].  Transported via EMS.  Pt in no distress at time of transfer.  All paperwork given to EMS personnel.  DNR form in packet, all belongings given to pt at time of discharge.

## 2021-04-02 NOTE — Progress Notes (Signed)
[facility] Liaison note:  Hospice home is able to offer a bed today. Writer met in the room with patient, her son Gwynneth Macleod and DIL Sherrie. Bed has been accepted. Patient confirmed her DNR status, removal of nasal feeding tube and NO ventilator at night, oxygen  to continue via trach collar at 5 liters. Hospital care team updated.  Transportation has been set up for 5 pm via EMS non-emergent transport. Hospital care team, patient and family all made aware.  RN to call report to [phone]. Pleas send trach supplies with patient and leave IV's in place.  Thank you for the opportunity to be involved in the care of this patient and her family. Flo Shanks BSN, RN, [place] [phone]

## 2021-04-02 NOTE — Progress Notes (Signed)
NAME:  Virginia [name], MRN:  [id number], DOB:  Nov 16, 1950, LOS: 13 ADMISSION DATE:  03/20/2021, CONSULTATION DATE: 03/20/2021 REFERRING MD: Dr. Vicente Males, CHIEF COMPLAINT: Unresponsiveness   History of Present Illness:  This is a 70 yo female with end stage COPD and chronic tracheostomy who presented to [facility] ER via EMS from home on 09/27 due to unresponsiveness.  Per ER notes EMS reported pts initial GCS was 3 and O2 sats were unreadable.  EMS provided bag ventilation via tracheostomy en route to the ER.  Pt also received 2 mg of narcan and initially became responsive.  Pts son informed EMS on the scene pts code status DNR, however there was no DNR paperwork readily available on the scene.  Pt recently hospitalized at [facility] from 08/28~09/19 following treatment of acute on chronic hypoxic hypercapnic respiratory failure secondary to AECOPD and enterobacter aerogenes bronchitis.    ED course Upon arrival to the ER pt unresponsive to verbal/painful stimulation with O2 sats initially in the mid 90's on RA.  However, pt immediately became hypoxic with O2 sats decreasing to 60% with shallow respirations on RA, requiring bag ventilation via tracheostomy.  Due to continued hypoxia pts #6 cuffless tracheostomy was changed to a #6 cuffed trach, and pt placed on mechanical ventilation via trach with O2 sats increasing to 92%.  Upon removal of cuffless trach RT noted pts inner canula was partially occluded by dried secretions.  VBG revealed pH 7.15/pCO2 118/acid-base excess 9.2/bicarb 41.1.  CXR concerning for bilateral trace pleural effusions and emphysema.  Code sepsis activated by ER physician pt received 1L LR bolus, ceftriaxone, azithromycin, and vancomycin due to concern of pneumonia.  PCCM team contacted for ICU admission.    Pertinent  Medical History  Atrial Fibrillation on Eliquis C. Difficile Severe COPD Chronic tracheostomy Anxiety & depression Hepatitis  C Arthritis HTN Fibromyalgia Anemia CAD Thoracic AAA  Significant Hospital Events: Including procedures, antibiotic start and stop dates in addition to other pertinent events   09/27: Pt admitted to ICU with acute on chronic hypoxic hypercapnic respiratory failure secondary to end stage COPD requiring mechanical ventilation via chronic tracheostomy  09/29: Tolerating PSV, will attempt TCT as tolerated 09/30: Unable to tolerate weaning pressures on PSV (immediately becomes extremely anxious and SOB with hypooxia), add scheduled Valium, plan to place Dobhoff for enteral access 10/2: Tolerating trach collars trials at 35%.  10/3: Eliquis restarted 10/1 after no plans for GI procedure and no further evidence of GI bleeding with hgb stable >8.0 10/4: No acute events overnight. Benzos adjusted yesterday per psychiatry, IV Valium and IV morphine discontinued.  10/5: Frequent episodes of diarrhea, continues on tube feeds. SLP eval today for po intake assessment.  Palliative asessment with hospice evaluation per patient request 03/29/21 - patient discussing hospice with palliative care and having family meeting for goals of care.  She had VT sustained and this responded well to beta blocker x metoprolol x1.  She reports severe anxiety and asking for benzodiazepine.  She is being followed by [facility] for end of life care.  10.7/22 - patient was evaluated by [facility] and is in process of getting bed approval for hospice care.  10/8-10/10/22- patient unchanged Clinically, awaiting hospice placement. Still with diarrhea   Objective   Blood pressure (!) 140/99, pulse 85, temperature 97.9 F (36.6 C), temperature source Oral, resp. rate 14, height 5' (1.524 m), weight 49 kg, SpO2 100 %.    Vent Mode: PRVC FiO2 (%):  [28 %] 28 % Set Rate:  [  14 bmp] 14 bmp Vt Set:  [450 mL] 450 mL PEEP:  [5 cmH20] 5 cmH20 Plateau Pressure:  [16 cmH20-20 cmH20] 16 cmH20   Intake/Output Summary (Last 24 hours) at  04/02/2021 1112 Last data filed at 04/02/2021 0400 Gross per 24 hour  Intake 295 ml  Output 700 ml  Net -405 ml   Filed Weights   03/31/21 0453 04/01/21 0500 04/02/21 0500  Weight: 50 kg 49.6 kg 49 kg   Examination: General: chronically ill appearing frail female, NAD, mechanically ventilated via chronic tracheostomy HENT: #6 [place]iley  midline chronic tracheostomy, no JVD  Lungs: mechanical breath sounds throughout, no wheezing or rales noted, synchronous with vent, even, non labored  Cardiovascular: Tachycardia, regular rhythm, no R/G, 2+ radial/1+ distal pulses trace bilateral lower extremity edema  Abdomen: +BS x4, soft, non tender, non distended, bowel sounds active x4 quadrants Extremities: normal tone, no edema  Neuro: awake, alert, following commands, no focal deficits, pupils PERRL GU: External catheter in place  Resolved Hospital Problem list   Lactic acidosis  Assessment & Plan:   Acute on chronic hypoxic hypercapnic respiratory failure secondary to AECOPD and mucous plugging & questionable underlying pneumonia Enterobacter 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 03/21/21 Mechanical ventilation via chronic tracheostomy  Hx: Chronic Home O2 @4 .5~5L during the day and trilogy with capped tracheostomy qhs -Trach collar trials during the day with vent as needed at night - Antibiotics completed - Scheduled & Prn Bronchodilators - Prednisone per tube   - Patient is with palliative service for hospice but wishes to continue full code until her arrival to [facility]; has stated she absolutely does not want to die in hospital - DC to hospice facility, pending bed availability at Hospice Home - Appreciate palliative care input  Mildly elevated troponin likely secondary to demand ischemia  Chronic atrial fibrillation on Eliquis SVT  Hx: HTN and CAD  -Continuous telemetry monitoring  -Eliquis restarted 10/1 after GI cleared  -Continue  Metoprolol, Cardizem, and Lisinopril as BP and renal functions permits -TSH low, but Free T4 normal -Prn metoprolol for hr >115   Acute Blood Loss anemia due to GI Bleed superimposed on Anemia of Chronic Disease, stable Patient with dark tarry stools POA that were Hemoccult positive Hx: Anemia -Monitor for S/Sx of bleeding -Trend CBC - hgb stable > 8.0, patient with hx chronic anemia -SCD's for VTE Prophylaxis, Eliquis restarted 10/1 -Transfusion held due to hospice  -Pantoprazole 40mg  IV BID -Hold NSAIDs, Anticoagulation, ASA -GI evaluated ~ patient declined EGD,  Recurrent encephalopathy due to hypercapnia >>resolved Anxiety/Panic Attacks with weaning Hx: Anxiety and depression  Benzodiazepine dependency - Maintain RASS goal 0 to -1 - Continue home po meds: diazepam, gabapentin, Seroquel, amitriptyline    - Diazepam tablets increased to 10mg  TID 10/8     - Hydroxyzine 25mg  TID prn per tube added 10/3 - Monitor EKG, on Seroquel - Minimize use of IV sedatives - Evaluated by psychiatry, appreciate input  - CT head negative for acute intracranial abnormality  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 Could tolerate po intake however in very small amounts, poor nutritional status on po intake alone.  - Hold colace for diarrhea - Start Imodium prn   Elevated liver enzymes likely secondary to hepatitis C hx>>resolved -Trend hepatic function panel prn  Lactic  acidosis>>resolved -Trend BMP and lactic acid prn  Best Practice (right click and "Reselect all SmartList Selections" daily)   Diet/type: NPO, tube feeds  DVT prophylaxis: SCD's, Eliquis restarted 10/1 GI prophylaxis: PPI Lines: N/A Foley: N/A Code Status:  full code Last date of multidisciplinary goals of care discussion  [04/02/2021]   Labs   CBC: Recent Labs  Lab 03/27/21 0608 03/29/21 0347 03/31/21 0441 04/01/21 0548 04/02/21 0540  WBC 12.0* 14.1* 10.3 9.7 13.0*  NEUTROABS 8.6* 11.0* 6.9  --   --   HGB 8.0* 8.8* 8.5* 8.5* 9.3*  HCT 25.4* 28.5* 28.1* 28.3* 30.6*  MCV 96.2 96.0 96.9 96.3 95.9  PLT 384 454* 425* 412* 429*    Basic Metabolic Panel: Recent Labs  Lab 03/27/21 0608 03/29/21 0347 03/30/21 0432 03/31/21 0441 04/01/21 0548 04/02/21 0540  NA 137 139 140 137 140 139  K 3.6 3.5 3.6 3.7 3.7 3.5  CL 96* 97* 98 93* 96* 95*  CO2 34* 34* 35* 33* 36* 32  GLUCOSE 99 118* 112* 110* 96 105*  BUN 20 21 25* 23 25* 25*  CREATININE 0.51 0.61 0.63 0.58 0.53 0.54  CALCIUM 8.6* 8.9 9.0 9.4 9.3 9.6  MG 1.9 2.0  --  1.9 2.0 1.8  PHOS 3.6 3.9  --  4.5 4.7* 4.6   GFR: Estimated Creatinine Clearance: 47 mL/min (by C-G formula based on SCr of 0.54 mg/dL). Recent Labs  Lab 03/29/21 0347 03/31/21 0441 04/01/21 0548 04/02/21 0540  WBC 14.1* 10.3 9.7 13.0*    Liver Function Tests: No results for input(s): AST, ALT, ALKPHOS, BILITOT, PROT, ALBUMIN in the last 168 hours.  No results for input(s): LIPASE, AMYLASE in the last 168 hours. No results for input(s): AMMONIA in the last 168 hours.  ABG    Component Value Date/Time   PHART 7.50 (H) 03/21/2021 0525   PCO2ART 40 03/21/2021 0525   PO2ART 120 (H) 03/21/2021 0525   HCO3 31.2 (H) 03/21/2021 0525   O2SAT 99.0 03/21/2021 0525     Coagulation Profile: No results for input(s): INR, PROTIME in the last 168 hours.   Cardiac Enzymes: No results for input(s): CKTOTAL, CKMB, CKMBINDEX, TROPONINI in the last 168 hours.  HbA1C: Hgb A1c MFr Bld  Date/Time Value Ref Range Status  01/08/2021 06:28 AM 4.9 4.8 - 5.6 % Final    Comment:    (NOTE) Pre diabetes:          5.7%-6.4%  Diabetes:              >6.4%  Glycemic control for   <7.0% adults with diabetes   06/06/2020 12:07 PM 5.3 4.8 - 5.6 % Final    Comment:    (NOTE) Pre  diabetes:          5.7%-6.4%  Diabetes:              >6.4%  Glycemic control for   <7.0% adults with diabetes     CBG: Recent Labs  Lab 03/26/21 1134 03/26/21 1532 03/26/21 1925 03/26/21 2318  GLUCAP 119* 158* 116* 96   Past Medical History:  She,  has a past medical history of Anemia, Anxiety, Arthritis, Atrial fibrillation (HCC), C. difficile colitis (09/2015), COPD (chronic obstructive pulmonary disease) (HCC), Depression, Dyspnea, Dysrhythmia, Fibromyalgia, H/O tracheostomy, Hep C w/ coma, chronic, Hypertension, MRSA pneumonia (HCC) (2017), On home oxygen therapy, Osteoporosis, Peripheral neuropathy, RLS (restless legs syndrome), S/P percutaneous endoscopic gastrostomy (PEG) tube placement (HCC) (09/2015), and Ventilator associated pneumonia (HCC) (10/2015).  Surgical History:   Past Surgical History:  Procedure Laterality Date   ABDOMINAL HYSTERECTOMY     BREAST SURGERY Bilateral    Breast Implants   DILATION AND CURETTAGE OF UTERUS     PEG PLACEMENT N/A 06/21/2020   Procedure: PERCUTANEOUS ENDOSCOPIC GASTROSTOMY (PEG) PLACEMENT;  Surgeon: Regis Bill, MD;  Location: [facility] ENDOSCOPY;  Service: Endoscopy;  Laterality: N/A;   REVERSE SHOULDER ARTHROPLASTY Right 03/06/2017   Procedure: REVERSE SHOULDER ARTHROPLASTY;  Surgeon: Christena Flake, MD;  Location: [facility] ORS;  Service: Orthopedics;  Laterality: Right;   ROTATOR CUFF REPAIR Bilateral    TRACHEOSTOMY TUBE PLACEMENT N/A 06/15/2020   Procedure: TRACHEOSTOMY;  Surgeon: Geanie Logan, MD;  Location: [facility] ORS;  Service: ENT;  Laterality: N/A;     Social History:   reports that she quit smoking about 5 years ago. Her smoking use included cigarettes. She smoked an average of 1.5 packs per day. She has never used smokeless tobacco. She reports that she does not drink alcohol and does not use drugs.   Family History:  Her family history includes Hypertension in her mother.   Allergies No Known Allergies   Home  Medications  Prior to Admission medications   Medication Sig Start Date End Date Taking? Authorizing Provider  albuterol (VENTOLIN HFA) 108 (90 Base) MCG/ACT inhaler Inhale 2 puffs into the lungs every 4 (four) hours as needed for wheezing or shortness of breath.    [provider]  amitriptyline (ELAVIL) 25 MG tablet Take 25 mg by mouth at bedtime.  01/14/19   [provider]  apixaban (ELIQUIS) 5 MG TABS tablet Take 1 tablet (5 mg total) by mouth 2 (two) times daily. 11/10/16   Adrian Saran, MD  budesonide (PULMICORT) 0.5 MG/2ML nebulizer solution Take 0.5 mg by nebulization daily. 11/28/20   [provider]  diazepam (VALIUM) 5 MG tablet Take 1-1.5 tablets (5-7.5 mg total) by mouth every 8 (eight) hours as needed for anxiety. 03/12/21   Osvaldo Shipper, MD  diltiazem (CARDIZEM CD) 120 MG 24 hr capsule Take 120 mg by mouth daily. 04/29/19   [provider]  estradiol (ESTRACE) 1 MG tablet Take 1 mg by mouth daily. 09/10/16   [provider]  gabapentin (NEURONTIN) 300 MG capsule Take 300 mg by mouth 3 (three) times daily. 05/06/20   [provider]  ipratropium-albuterol (DUONEB) 0.5-2.5 (3) MG/3ML SOLN Take 3 mLs by nebulization every 6 (six) hours. 02/23/21   Salena Saner, MD  iron polysaccharides (NIFEREX) 150 MG capsule Take 1 capsule (150 mg total) by mouth daily. 01/12/21 04/12/21  Gillis Santa, MD  MAGNESIUM-OXIDE 400 (241.3 Mg) MG tablet Take 400 mg by mouth daily.  08/24/16   [provider]  methocarbamol (ROBAXIN) 500 MG tablet Take 500 mg by mouth 2 (two) times daily. 12/27/20   [provider]  metoprolol tartrate (LOPRESSOR) 25 MG tablet Take 1 tablet (25 mg total) by mouth 2 (two) times daily. 03/12/21 06/10/21  Osvaldo Shipper, MD  omeprazole (PRILOSEC) 40 MG capsule Take 40 mg by mouth daily. 04/16/19   [provider]  predniSONE (DELTASONE) 5 MG tablet Take 5 mg by mouth daily. 12/25/20   [provider]  QUEtiapine (SEROQUEL) 25 MG tablet Take 25 mg by mouth at bedtime.  10/01/16   [provider]  rosuvastatin (CRESTOR) 10 MG tablet Take 10 mg by mouth at bedtime. 10/23/20   [provider]  vitamin B-12 100 MCG tablet Take 1 tablet (100  mcg total) by mouth daily. 01/12/21 04/12/21  Gillis Santa, MD  Vitamin D, Ergocalciferol, (DRISDOL) 1.25 MG (50000 UNIT) CAPS capsule Take 1 capsule (50,000 Units total) by mouth every 7 (seven) days. 01/17/21 04/17/21  Gillis Santa, MD    Scheduled Meds:  amitriptyline  25 mg Per Tube QHS   apixaban  5 mg Per Tube BID   budesonide (PULMICORT) nebulizer solution  0.25 mg Nebulization BID   chlorhexidine  15 mL Mouth Rinse BID   Chlorhexidine Gluconate Cloth  6 each Topical Q0600   diazepam  10 mg Per Tube Q8H   free water  60 mL Per Tube Q4H   gabapentin  300 mg Per Tube TID   ipratropium-albuterol  3 mL Nebulization Q6H   iron polysaccharides  150 mg Per Tube Daily   lidocaine  1 patch Transdermal Q24H   magnesium oxide  400 mg Per Tube Daily   mouth rinse  15 mL Mouth Rinse q12n4p   methocarbamol  500 mg Per Tube BID   metoprolol tartrate  25 mg Per Tube BID   pantoprazole sodium  40 mg Per Tube Daily   predniSONE  10 mg Oral Q breakfast   QUEtiapine  25 mg Per Tube QHS   rosuvastatin  10 mg Per Tube QHS   sodium chloride flush  10-40 mL Intracatheter Q12H   cyanocobalamin  100 mcg Per Tube Daily   Continuous Infusions:  sodium chloride     feeding supplement (VITAL AF 1.2 CAL) 45 mL/hr at 04/02/21 0000   PRN Meds:.Benzocaine, benzocaine, hydrOXYzine, ipratropium-albuterol, loperamide, metoprolol tartrate, phenol, sodium chloride flush, white petrolatum  DISPOSITION TO HOSPICE HOME WITH DNR STATUS WITH OXYGEN SUPPORT ONLY(NO VENT SUPPORT)    DVT/GI PRX  assessed I Assessed the need for Labs I Assessed the need for Foley I Assessed the need for Central Venous Line Family Discussion when available I Assessed the need  for Mobilization I made an Assessment of medications to be adjusted accordingly Safety Risk assessment completed  CASE DISCUSSED IN MULTIDISCIPLINARY ROUNDS WITH ICU TEAM     Critical Care Time devoted to patient care services described in this note is 50 minutes.  Critical care was necessary to treat /prevent imminent and life-threatening deterioration. Overall, patient is critically ill, prognosis is guarded.     Lucie Leather, M.D.  [facility]  Medical Director [facility] Medical Director [facility] Cardio-Pulmonary Department

## 2021-04-02 NOTE — Progress Notes (Addendum)
Daily Progress Note   Patient Name: Virginia [name]       Date: 04/02/2021 DOB: 1950/10/21  Age: 70 y.o. MRN#: [id number] Attending Physician: Erin Fulling, MD Primary Care Physician: Marguarite Arbour, MD Admit Date: 03/20/2021  Reason for Consultation/Follow-up: Terminal Care  Subjective: Patient is resting in bed with family at bedside. She is waiting for a hospice bed. Currently maintaining care and will transition to full comfort care at hospice.   Returned when visitors had stepped out. Granddaughter was present. Patient denies complaint. She is at peace with her decisions and is happy her family is as well. Discussed code status and discussed scenario of having CPR and ACLS here to attempt to restart her heart and breathing, with a plan to transfer to hospice facility. Discussed her over all goal of a peaceful death with dignity, and she states she would like to transition to DNR status. CCM NP made aware.    Length of Stay: 13  Current Medications: Scheduled Meds:  . amitriptyline  25 mg Per Tube QHS  . apixaban  5 mg Per Tube BID  . budesonide (PULMICORT) nebulizer solution  0.25 mg Nebulization BID  . chlorhexidine  15 mL Mouth Rinse BID  . Chlorhexidine Gluconate Cloth  6 each Topical Q0600  . diazepam  10 mg Per Tube Q8H  . fiber  1 packet Per Tube BID  . free water  60 mL Per Tube Q4H  . gabapentin  300 mg Per Tube TID  . ipratropium-albuterol  3 mL Nebulization Q6H  . iron polysaccharides  150 mg Per Tube Daily  . lidocaine  1 patch Transdermal Q24H  . magnesium oxide  400 mg Per Tube Daily  . mouth rinse  15 mL Mouth Rinse q12n4p  . methocarbamol  500 mg Per Tube BID  . metoprolol tartrate  25 mg Per Tube BID  . pantoprazole sodium  40 mg Per Tube Daily  .  predniSONE  10 mg Oral Q breakfast  . QUEtiapine  25 mg Per Tube QHS  . rosuvastatin  10 mg Per Tube QHS  . sodium chloride flush  10-40 mL Intracatheter Q12H  . cyanocobalamin  100 mcg Per Tube Daily    Continuous Infusions: . sodium chloride    . feeding supplement (VITAL AF 1.2 CAL) 45 mL/hr  at 04/02/21 0000    PRN Meds: Benzocaine, benzocaine, hydrOXYzine, ipratropium-albuterol, loperamide, metoprolol tartrate, phenol, sodium chloride flush, white petrolatum  Physical Exam Pulmonary:     Comments: On trach collar Neurological:     Mental Status: She is alert.            Vital Signs: BP 125/85   Pulse 90   Temp 97.9 F (36.6 C) (Oral)   Resp (!) 25   Ht 5' (1.524 m)   Wt 49 kg   SpO2 98%   BMI 21.10 kg/m  SpO2: SpO2: 98 % O2 Device: O2 Device: Tracheostomy Collar O2 Flow Rate: O2 Flow Rate (L/min): 5 L/min  Intake/output summary:  Intake/Output Summary (Last 24 hours) at 04/02/2021 1243 Last data filed at 04/02/2021 0400 Gross per 24 hour  Intake 295 ml  Output 700 ml  Net -405 ml   LBM: Last BM Date: 04/02/21 Baseline Weight: Weight: 54 kg Most recent weight: Weight: 49 kg        Patient Active Problem List   Diagnosis Date Noted  . Generalized anxiety disorder 03/26/2021  . Acute on chronic respiratory failure with hypoxemia (HCC) 03/20/2021  . Pneumonia due to enterobacter aerogenes (HCC) 02/23/2021  . Acute respiratory failure (HCC) 02/18/2021  . SVT (supraventricular tachycardia) (HCC) 02/18/2021  . Malnutrition of moderate degree 01/08/2021  . On mechanically assisted ventilation (HCC) 01/07/2021  . Hyperlipidemia 01/07/2021  . Anxiety 01/07/2021  . Acute on chronic respiratory failure (HCC) 01/06/2021  . Anorexia 08/04/2020  . Severe protein-calorie malnutrition (HCC) 08/04/2020  . Pressure injury of skin 07/14/2020  . Diarrhea   . Reactive thrombocytosis 07/12/2020  . Aspiration pneumonia of both lower lobes due to gastric secretions (HCC)  07/07/2020  . Acute urinary retention 07/06/2020  . Acute metabolic encephalopathy 07/06/2020  . Acute on chronic respiratory failure with hypoxia and hypercapnia (HCC) 07/06/2020  . COPD exacerbation (HCC)   . Respiratory failure (HCC) 06/06/2020  . Acute respiratory failure with hypoxia (HCC) 06/02/2020  . COPD with acute exacerbation (HCC) 06/02/2020  . Shortness of breath 06/02/2020  . GERD (gastroesophageal reflux disease) 06/02/2020  . Iron deficiency anemia 01/22/2020  . Anemia of chronic disease 01/17/2019  . Coronary artery disease involving native coronary artery of native heart 04/30/2018  . Palpitations 04/30/2018  . Chronic heartburn 04/12/2018  . Dysphagia 04/12/2018  . Thoracic aortic aneurysm without rupture 08/27/2017  . 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 SOBOE (shortness of breath on exertion) 01/24/2017  . AF (paroxysmal atrial fibrillation) (HCC) 11/09/2016  . Chronic hypoxemic respiratory failure (HCC) 12/06/2015  . Depression 12/06/2015  . Fibromyalgia 12/06/2015  . Hep C w/o coma, chronic (HCC) 12/06/2015  . History of tracheostomy 12/06/2015  . Hypertension 12/06/2015  . Localized osteoporosis without current pathological fracture 12/06/2015  . PEG (percutaneous endoscopic gastrostomy) status (HCC) 12/06/2015  . Peripheral neuropathy, idiopathic 12/06/2015  . RLS (restless legs syndrome) 12/06/2015  . 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  . COPD with acute lower respiratory infection (HCC) 11/29/2015    Palliative Care Assessment & Plan    Recommendations/Plan: Waiting for hospice facility bed.     Code Status:    Code Status Orders  (From admission, onward)           Start     Ordered   03/30/21 2245  Full code  Continuous        03/30/21 2244           Code  Status History     Date Active Date Inactive Code Status Order ID Comments User Context   03/30/2021 1202 03/30/2021 2244 DNR  [id number]  Morton Stall, NP Inpatient   03/20/2021 2049 03/30/2021 1202 Full Code [id number]  Jimmye Norman, NP ED   02/18/2021 2054 03/13/2021 0140 Full Code [id number]  Rust-Chester, Cecelia Byars, NP ED   01/06/2021 2058 01/12/2021 0049 Full Code [id number]  Rust-Chester, Cecelia Byars, NP ED   06/06/2020 1434 08/12/2020 0046 Full Code [id number]  Erin Fulling, MD ED   06/02/2020 2233 06/04/2020 2138 Full Code [id number]  Angie Fava, DO ED   03/06/2017 1238 03/07/2017 2049 Full Code [id number]  Poggi, Excell Seltzer, MD Inpatient   11/09/2016 2235 11/10/2016 1625 Full Code [id number]  Auburn Bilberry, MD Inpatient       Prognosis:  < 2 weeks  Thank you for allowing the Palliative Medicine Team to assist in the care of this patient.       Total Time 15 min Prolonged Time Billed  no       Greater than 50%  of this time was spent counseling and coordinating care related to the above assessment and plan.  Morton Stall, NP  Please contact Palliative Medicine Team phone at [phone] for questions and concerns.

## 2021-04-02 NOTE — Progress Notes (Signed)
PHARMACY CONSULT NOTE  Pharmacy Consult for Electrolyte Monitoring and Replacement   Recent Labs: Potassium (mmol/L)  Date Value  04/02/2021 3.5   Magnesium (mg/dL)  Date Value  82/57/4935 1.8   Calcium (mg/dL)  Date Value  52/17/4715 9.6   Albumin (g/dL)  Date Value  95/39/6728 2.8 (L)   Phosphorus (mg/dL)  Date Value  97/91/5041 4.6   Sodium (mmol/L)  Date Value  04/02/2021 139    Assessment: 70 year old female presented to the ED unresponsive. Patient required mechanical ventilation via chronic trach. Respiratory failure s/t end stage COPD. Patient with dark tarry stools that were hemoccult positive in the ED, concerning for GIB. Pharmacy consult to manage electrolytes.  On Mg oxide 400 mg daily.  On free water 60 ml q4H.   Goal of Therapy:  Electrolytes WNL  Plan:  --No replacement at this time  --Plan for patient to discharge to hospice home when bed available. Defer frequency of lab monitoring to provider --Continue to follow along  Martyn Malay, PharmD, [facility] Clinical Pharmacist 04/02/2021 12:16 PM

## 2021-08-29 ENCOUNTER — Encounter: Payer: Self-pay | Admitting: Oncology

## 2021-09-21 ENCOUNTER — Encounter: Payer: Self-pay | Admitting: Oncology

## 2021-10-27 IMAGING — DX DG CHEST 1V
1 series · 1 of 1 positions shown · non-contrast
Comparison: Single-view of the chest 06/19/2020 in 06/11/2020.

CLINICAL DATA: Acute hypoxic respiratory failure.

EXAM:
CHEST  1 VIEW

[chest ap]
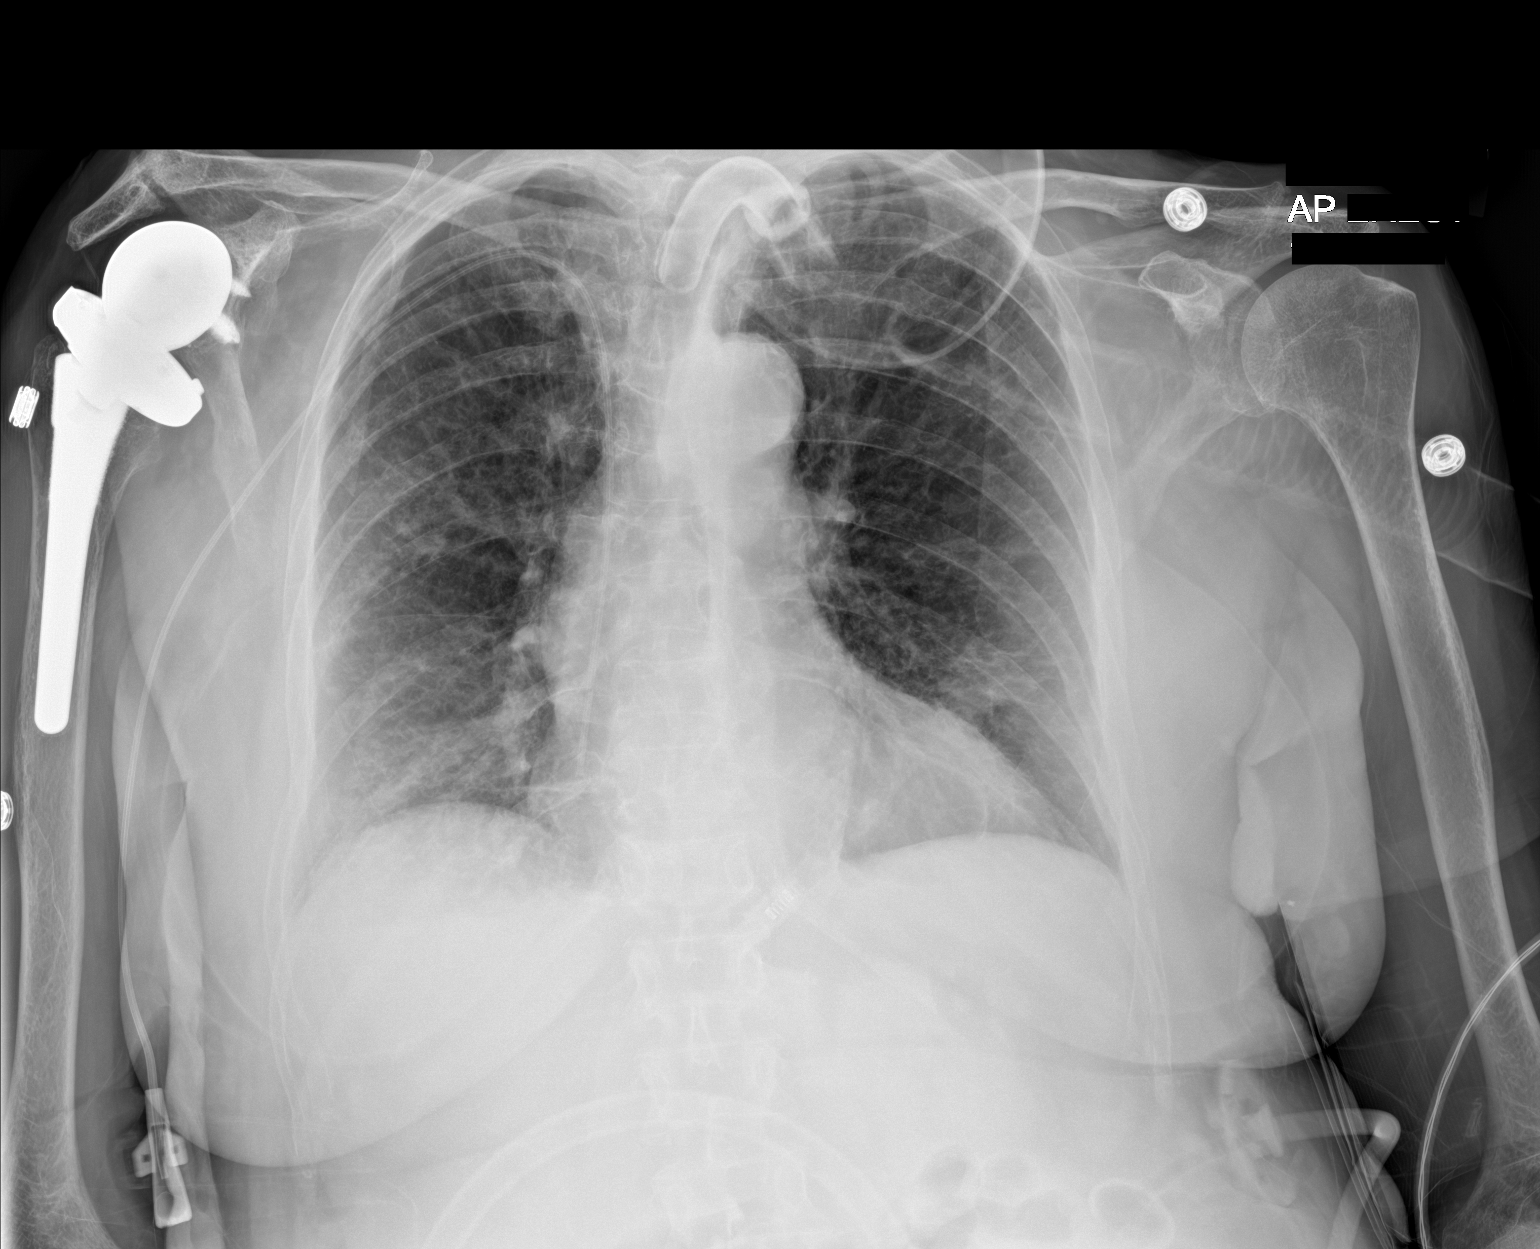

[1 of 1 positions shown; findings below may reference images not displayed]

FINDINGS: Tracheostomy tube and right PICC are in place. Tip of the patient's
PICC projects in the right atrium. There is patchy airspace disease
in the right mid and lower lung zones and the lingula. No
pneumothorax or pleural fluid. Heart size is normal.
IMPRESSION: Tip of right PICC projects in the right atrium. The catheter could
be withdrawn 3-4 cm for better positioning.

Right worse than left patchy airspace disease worrisome for
pneumonia.

## 2021-11-18 IMAGING — DX DG ANKLE 2V *L*
2 series · 2 of 2 positions shown · non-contrast
Comparison: None.

CLINICAL DATA: Left ankle pain, no reported injury.

EXAM:
LEFT ANKLE - 2 VIEW

[ankle ap]
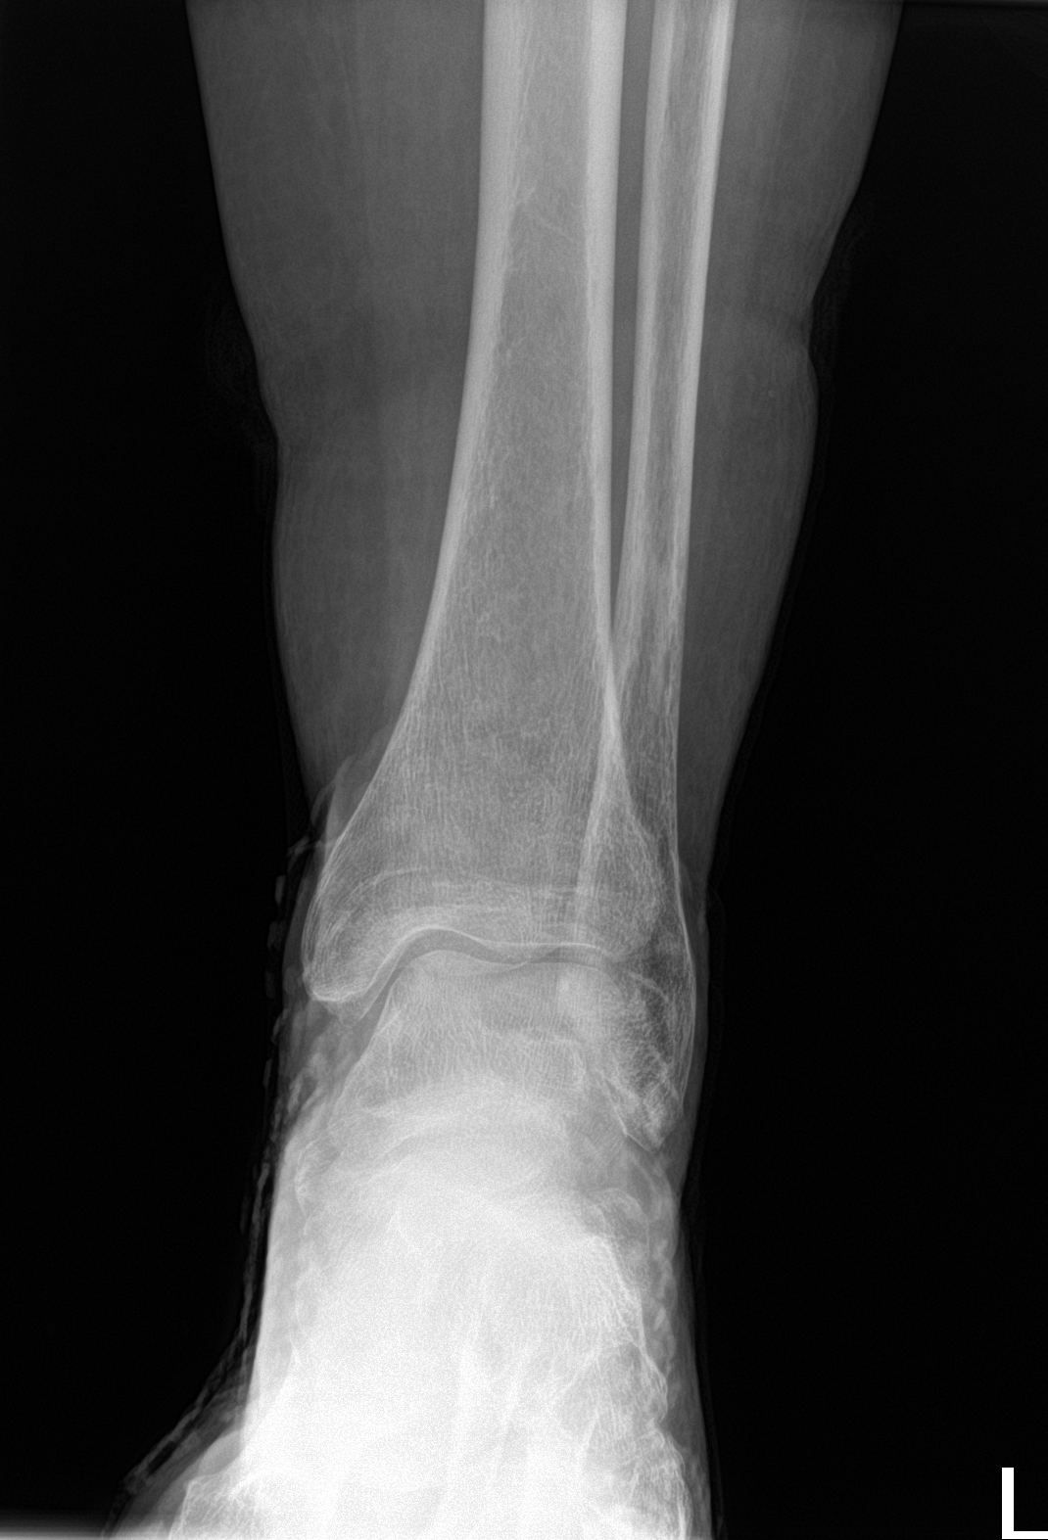

[ankle lat]
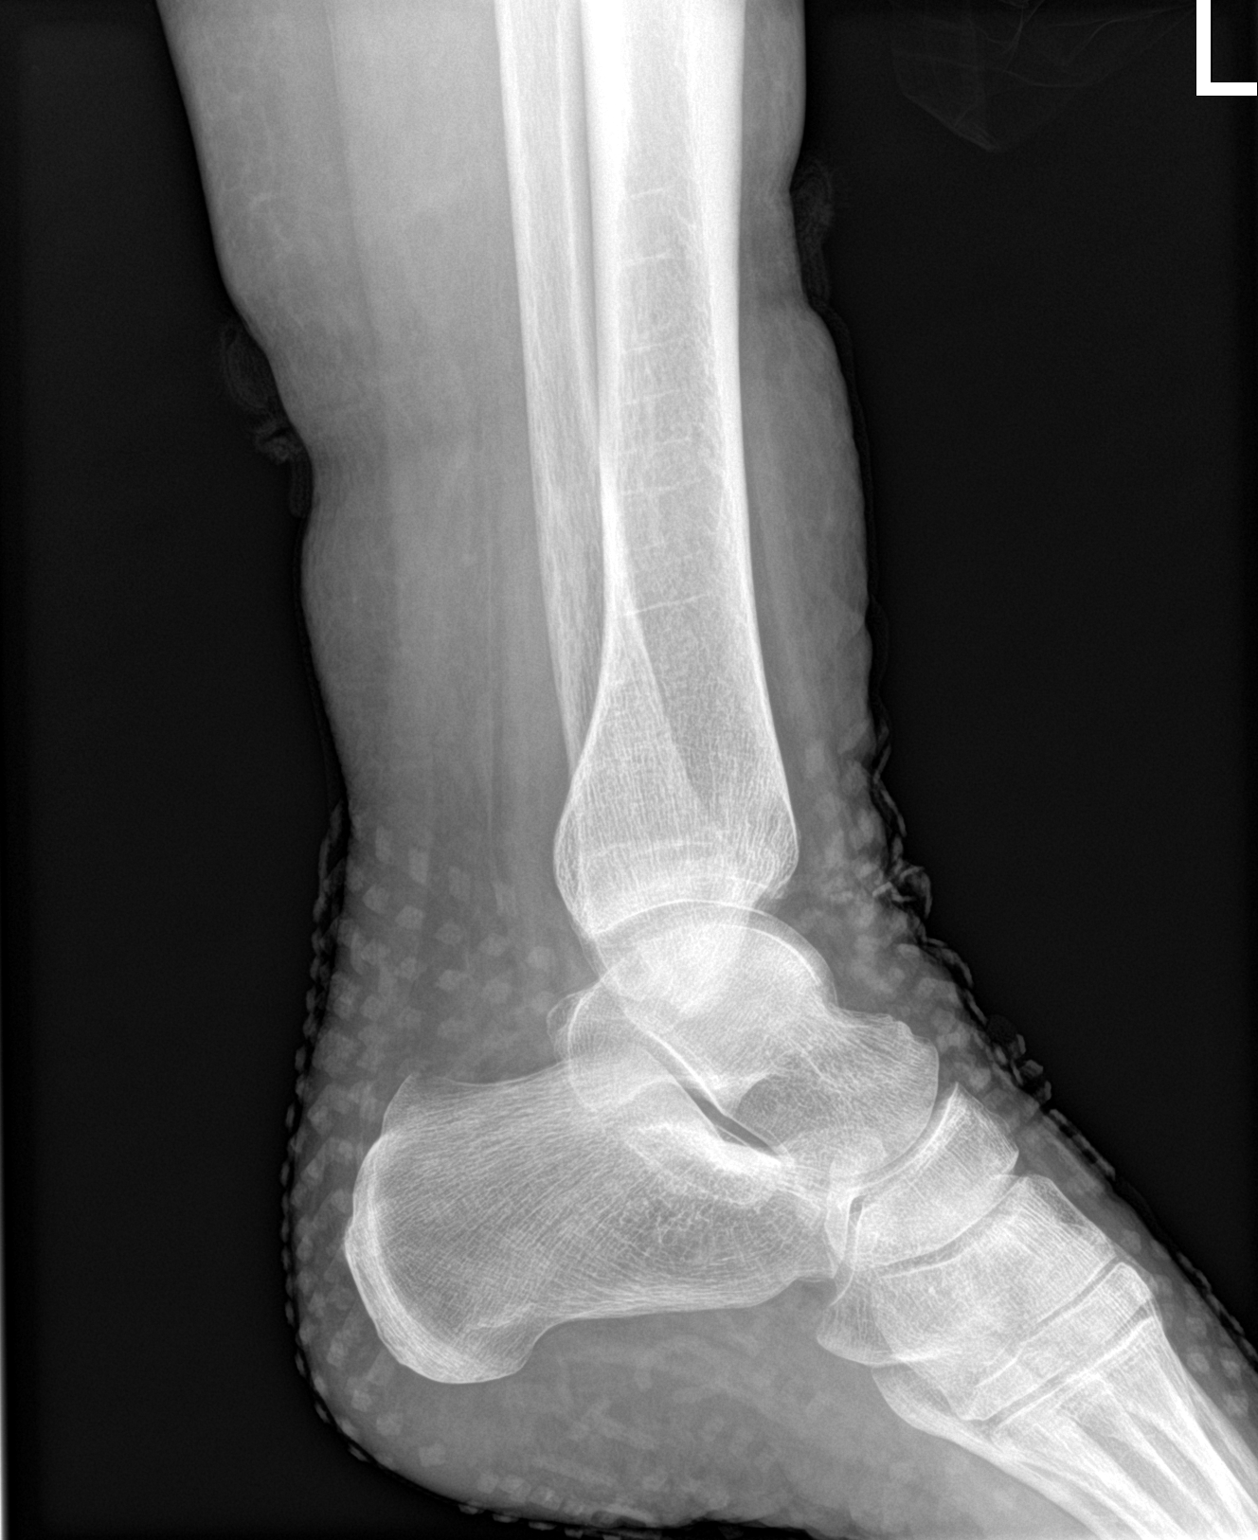

[2 of 2 positions shown; findings below may reference images not displayed]

FINDINGS: Patient's sock creates artifact superimposed with the osseous
structures. No acute osseous or joint abnormality.
IMPRESSION: No acute osseous or joint abnormality.

## 2021-11-29 IMAGING — DX DG CHEST 1V PORT
1 series · 1 of 1 positions shown · non-contrast
Comparison: 07/13/2020

CLINICAL DATA: COPD

EXAM:
PORTABLE CHEST 1 VIEW

[chest ap]
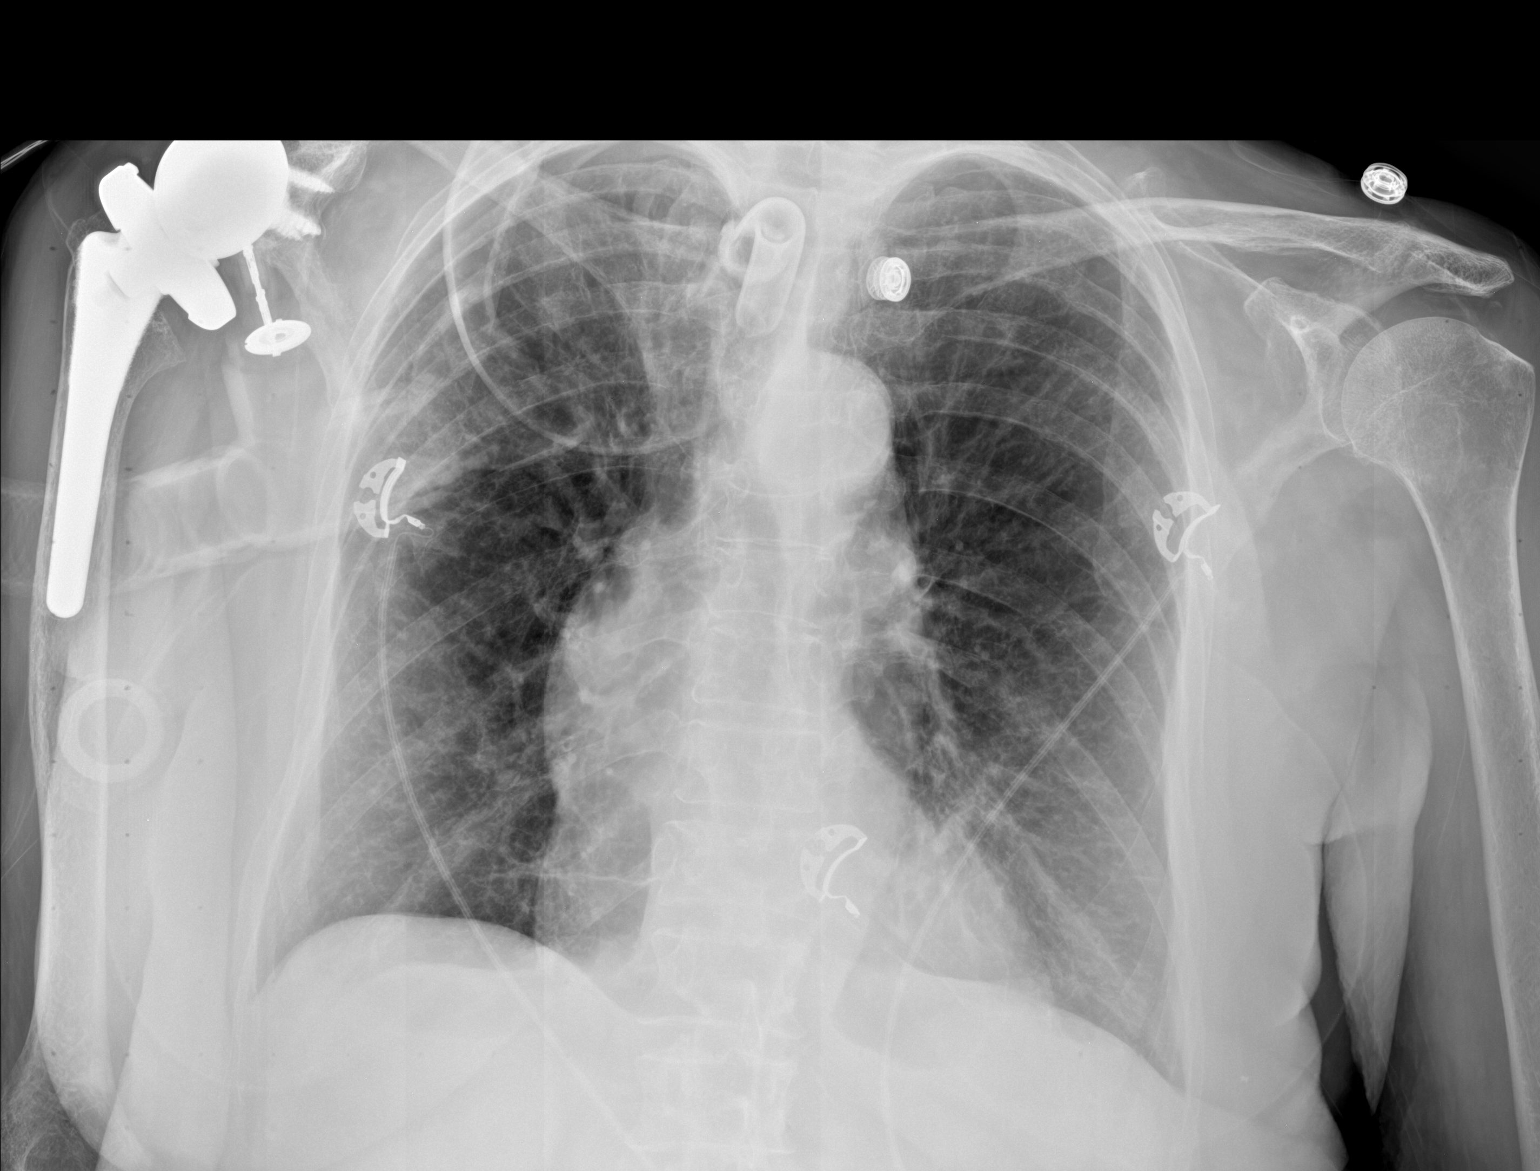

[1 of 1 positions shown; findings below may reference images not displayed]

FINDINGS: Tracheostomy is unchanged. Heart is normal size. No confluent
opacities or effusions. No acute bony abnormality.
IMPRESSION: No active disease.
